# Patient Record
Sex: Female | Born: 1935
Health system: Southern US, Community
[De-identification: ages and names within clinical notes are randomized; demographics above are authoritative.]

## PROBLEM LIST (undated history)

## (undated) DIAGNOSIS — R52 Pain, unspecified: Secondary | ICD-10-CM

## (undated) DIAGNOSIS — R251 Tremor, unspecified: Secondary | ICD-10-CM

## (undated) DIAGNOSIS — N183 Chronic kidney disease, stage 3 unspecified: Secondary | ICD-10-CM

## (undated) DIAGNOSIS — M179 Osteoarthritis of knee, unspecified: Secondary | ICD-10-CM

## (undated) DIAGNOSIS — K219 Gastro-esophageal reflux disease without esophagitis: Secondary | ICD-10-CM

## (undated) DIAGNOSIS — Z9289 Personal history of other medical treatment: Secondary | ICD-10-CM

## (undated) DIAGNOSIS — I4891 Unspecified atrial fibrillation: Secondary | ICD-10-CM

## (undated) DIAGNOSIS — G4730 Sleep apnea, unspecified: Secondary | ICD-10-CM

## (undated) DIAGNOSIS — I495 Sick sinus syndrome: Secondary | ICD-10-CM

## (undated) DIAGNOSIS — T8859XA Other complications of anesthesia, initial encounter: Secondary | ICD-10-CM

## (undated) DIAGNOSIS — J189 Pneumonia, unspecified organism: Secondary | ICD-10-CM

## (undated) DIAGNOSIS — T4145XA Adverse effect of unspecified anesthetic, initial encounter: Secondary | ICD-10-CM

## (undated) DIAGNOSIS — E785 Hyperlipidemia, unspecified: Secondary | ICD-10-CM

## (undated) DIAGNOSIS — I639 Cerebral infarction, unspecified: Secondary | ICD-10-CM

## (undated) DIAGNOSIS — J449 Chronic obstructive pulmonary disease, unspecified: Secondary | ICD-10-CM

## (undated) DIAGNOSIS — M1710 Unilateral primary osteoarthritis, unspecified knee: Secondary | ICD-10-CM

## (undated) DIAGNOSIS — I2510 Atherosclerotic heart disease of native coronary artery without angina pectoris: Secondary | ICD-10-CM

## (undated) DIAGNOSIS — T462X5A Adverse effect of other antidysrhythmic drugs, initial encounter: Secondary | ICD-10-CM

## (undated) DIAGNOSIS — E119 Type 2 diabetes mellitus without complications: Secondary | ICD-10-CM

## (undated) DIAGNOSIS — D62 Acute posthemorrhagic anemia: Secondary | ICD-10-CM

## (undated) DIAGNOSIS — J984 Other disorders of lung: Secondary | ICD-10-CM

## (undated) DIAGNOSIS — Z950 Presence of cardiac pacemaker: Secondary | ICD-10-CM

## (undated) DIAGNOSIS — C50919 Malignant neoplasm of unspecified site of unspecified female breast: Secondary | ICD-10-CM

## (undated) DIAGNOSIS — J40 Bronchitis, not specified as acute or chronic: Secondary | ICD-10-CM

## (undated) DIAGNOSIS — F99 Mental disorder, not otherwise specified: Secondary | ICD-10-CM

## (undated) DIAGNOSIS — L309 Dermatitis, unspecified: Secondary | ICD-10-CM

## (undated) DIAGNOSIS — I712 Thoracic aortic aneurysm, without rupture: Secondary | ICD-10-CM

## (undated) DIAGNOSIS — Z85828 Personal history of other malignant neoplasm of skin: Secondary | ICD-10-CM

## (undated) DIAGNOSIS — Z720 Tobacco use: Secondary | ICD-10-CM

## (undated) DIAGNOSIS — M5431 Sciatica, right side: Secondary | ICD-10-CM

## (undated) DIAGNOSIS — I509 Heart failure, unspecified: Secondary | ICD-10-CM

## (undated) DIAGNOSIS — F329 Major depressive disorder, single episode, unspecified: Secondary | ICD-10-CM

## (undated) DIAGNOSIS — J45909 Unspecified asthma, uncomplicated: Secondary | ICD-10-CM

## (undated) DIAGNOSIS — I739 Peripheral vascular disease, unspecified: Secondary | ICD-10-CM

## (undated) DIAGNOSIS — IMO0001 Reserved for inherently not codable concepts without codable children: Secondary | ICD-10-CM

## (undated) HISTORY — DX: Unspecified atrial fibrillation: I48.91

## (undated) HISTORY — DX: Malignant neoplasm of unspecified site of unspecified female breast: C50.919

## (undated) HISTORY — DX: Atherosclerotic heart disease of native coronary artery without angina pectoris: I25.10

## (undated) HISTORY — DX: Sick sinus syndrome: I49.5

## (undated) HISTORY — PX: VASCULAR SURGERY: SHX849

## (undated) HISTORY — DX: Osteoarthritis of knee, unspecified: M17.9

## (undated) HISTORY — PX: CATARACT EXTRACTION, BILATERAL: SHX1313

## (undated) HISTORY — DX: Tobacco use: Z72.0

## (undated) HISTORY — DX: Adverse effect of other antidysrhythmic drugs, initial encounter: T46.2X5A

## (undated) HISTORY — DX: Hyperlipidemia, unspecified: E78.5

## (undated) HISTORY — DX: Type 2 diabetes mellitus without complications: E11.9

## (undated) HISTORY — PX: ATRIAL ABLATION SURGERY: SHX560

## (undated) HISTORY — DX: Cerebral infarction, unspecified: I63.9

## (undated) HISTORY — DX: Sciatica, right side: M54.31

## (undated) HISTORY — PX: OTHER SURGICAL HISTORY: SHX169

## (undated) HISTORY — DX: Personal history of other medical treatment: Z92.89

## (undated) HISTORY — DX: Unilateral primary osteoarthritis, unspecified knee: M17.10

## (undated) HISTORY — DX: Major depressive disorder, single episode, unspecified: F32.9

## (undated) HISTORY — PX: BREAST LUMPECTOMY: SHX2

## (undated) HISTORY — DX: Chronic obstructive pulmonary disease, unspecified: J44.9

## (undated) HISTORY — DX: Chronic kidney disease, stage 3 unspecified: N18.30

## (undated) HISTORY — DX: Presence of cardiac pacemaker: Z95.0

## (undated) HISTORY — DX: Sleep apnea, unspecified: G47.30

## (undated) HISTORY — DX: Chronic kidney disease, stage 3 (moderate): N18.3

## (undated) HISTORY — DX: Peripheral vascular disease, unspecified: I73.9

## (undated) HISTORY — DX: Gastro-esophageal reflux disease without esophagitis: K21.9

## (undated) HISTORY — DX: Other disorders of lung: J98.4

## (undated) HISTORY — PX: CARDIAC CATHETERIZATION: SHX172

---

## 1997-07-17 ENCOUNTER — Inpatient Hospital Stay (HOSPITAL_COMMUNITY): Admission: AD | Admit: 1997-07-17 | Discharge: 1997-07-20 | Payer: Self-pay | Admitting: *Deleted

## 1998-09-27 ENCOUNTER — Encounter: Payer: Self-pay | Admitting: Cardiovascular Disease

## 1998-09-27 ENCOUNTER — Inpatient Hospital Stay (HOSPITAL_COMMUNITY): Admission: AD | Admit: 1998-09-27 | Discharge: 1998-09-28 | Payer: Self-pay | Admitting: Cardiovascular Disease

## 1998-09-30 ENCOUNTER — Ambulatory Visit (HOSPITAL_COMMUNITY): Admission: RE | Admit: 1998-09-30 | Discharge: 1998-09-30 | Payer: Self-pay | Admitting: Cardiovascular Disease

## 1998-09-30 ENCOUNTER — Encounter: Payer: Self-pay | Admitting: Cardiovascular Disease

## 1998-10-14 ENCOUNTER — Inpatient Hospital Stay (HOSPITAL_COMMUNITY): Admission: AD | Admit: 1998-10-14 | Discharge: 1998-10-17 | Payer: Self-pay | Admitting: Cardiology

## 1998-10-15 ENCOUNTER — Encounter: Payer: Self-pay | Admitting: *Deleted

## 1998-10-21 ENCOUNTER — Other Ambulatory Visit: Admission: RE | Admit: 1998-10-21 | Discharge: 1998-10-21 | Payer: Self-pay | Admitting: Family Medicine

## 1998-11-25 ENCOUNTER — Ambulatory Visit (HOSPITAL_COMMUNITY): Admission: RE | Admit: 1998-11-25 | Discharge: 1998-11-25 | Payer: Self-pay | Admitting: Gastroenterology

## 1999-01-14 ENCOUNTER — Ambulatory Visit (HOSPITAL_COMMUNITY): Admission: RE | Admit: 1999-01-14 | Discharge: 1999-01-14 | Payer: Self-pay | Admitting: Obstetrics and Gynecology

## 1999-01-14 ENCOUNTER — Encounter (INDEPENDENT_AMBULATORY_CARE_PROVIDER_SITE_OTHER): Payer: Self-pay | Admitting: Specialist

## 1999-03-13 ENCOUNTER — Ambulatory Visit (HOSPITAL_COMMUNITY): Admission: RE | Admit: 1999-03-13 | Discharge: 1999-03-13 | Payer: Self-pay | Admitting: Internal Medicine

## 1999-04-02 ENCOUNTER — Encounter: Payer: Self-pay | Admitting: Internal Medicine

## 1999-04-02 ENCOUNTER — Encounter: Admission: RE | Admit: 1999-04-02 | Discharge: 1999-04-02 | Payer: Self-pay | Admitting: Internal Medicine

## 1999-04-14 ENCOUNTER — Encounter: Admission: RE | Admit: 1999-04-14 | Discharge: 1999-07-13 | Payer: Self-pay | Admitting: Internal Medicine

## 1999-05-13 ENCOUNTER — Other Ambulatory Visit: Admission: RE | Admit: 1999-05-13 | Discharge: 1999-05-13 | Payer: Self-pay | Admitting: Gastroenterology

## 1999-05-15 ENCOUNTER — Inpatient Hospital Stay (HOSPITAL_COMMUNITY): Admission: RE | Admit: 1999-05-15 | Discharge: 1999-05-16 | Payer: Self-pay | Admitting: Cardiovascular Disease

## 1999-06-02 ENCOUNTER — Encounter: Admission: RE | Admit: 1999-06-02 | Discharge: 1999-06-02 | Payer: Self-pay | Admitting: Internal Medicine

## 1999-06-02 ENCOUNTER — Encounter: Payer: Self-pay | Admitting: Internal Medicine

## 1999-08-22 ENCOUNTER — Inpatient Hospital Stay: Admission: RE | Admit: 1999-08-22 | Discharge: 1999-08-23 | Payer: Self-pay | Admitting: Cardiovascular Disease

## 2000-01-22 ENCOUNTER — Ambulatory Visit (HOSPITAL_COMMUNITY): Admission: RE | Admit: 2000-01-22 | Discharge: 2000-01-22 | Payer: Self-pay | Admitting: Gastroenterology

## 2000-01-23 ENCOUNTER — Other Ambulatory Visit: Admission: RE | Admit: 2000-01-23 | Discharge: 2000-01-23 | Payer: Self-pay | Admitting: Obstetrics and Gynecology

## 2000-01-23 ENCOUNTER — Encounter (INDEPENDENT_AMBULATORY_CARE_PROVIDER_SITE_OTHER): Payer: Self-pay

## 2000-05-21 ENCOUNTER — Encounter: Admission: RE | Admit: 2000-05-21 | Discharge: 2000-05-21 | Payer: Self-pay | Admitting: Internal Medicine

## 2000-05-21 ENCOUNTER — Encounter: Payer: Self-pay | Admitting: Internal Medicine

## 2000-05-28 ENCOUNTER — Encounter: Admission: RE | Admit: 2000-05-28 | Discharge: 2000-05-28 | Payer: Self-pay | Admitting: Internal Medicine

## 2000-05-28 ENCOUNTER — Encounter: Payer: Self-pay | Admitting: Internal Medicine

## 2000-07-12 ENCOUNTER — Other Ambulatory Visit: Admission: RE | Admit: 2000-07-12 | Discharge: 2000-07-12 | Payer: Self-pay | Admitting: Obstetrics and Gynecology

## 2001-07-12 ENCOUNTER — Other Ambulatory Visit: Admission: RE | Admit: 2001-07-12 | Discharge: 2001-07-12 | Payer: Self-pay | Admitting: Obstetrics and Gynecology

## 2001-08-04 ENCOUNTER — Encounter: Admission: RE | Admit: 2001-08-04 | Discharge: 2001-08-04 | Payer: Self-pay | Admitting: Obstetrics and Gynecology

## 2001-08-04 ENCOUNTER — Encounter: Payer: Self-pay | Admitting: Obstetrics and Gynecology

## 2001-09-28 ENCOUNTER — Emergency Department (HOSPITAL_COMMUNITY): Admission: AC | Admit: 2001-09-28 | Discharge: 2001-09-29 | Payer: Self-pay

## 2001-09-28 ENCOUNTER — Encounter: Payer: Self-pay | Admitting: Emergency Medicine

## 2001-10-24 ENCOUNTER — Encounter: Payer: Self-pay | Admitting: Obstetrics and Gynecology

## 2001-10-24 ENCOUNTER — Encounter: Admission: RE | Admit: 2001-10-24 | Discharge: 2001-10-24 | Payer: Self-pay | Admitting: Obstetrics and Gynecology

## 2002-05-30 ENCOUNTER — Ambulatory Visit (HOSPITAL_COMMUNITY): Admission: RE | Admit: 2002-05-30 | Discharge: 2002-05-30 | Payer: Self-pay | Admitting: Internal Medicine

## 2002-06-30 ENCOUNTER — Encounter: Admission: RE | Admit: 2002-06-30 | Discharge: 2002-06-30 | Payer: Self-pay | Admitting: Internal Medicine

## 2002-06-30 ENCOUNTER — Encounter: Payer: Self-pay | Admitting: Internal Medicine

## 2002-07-14 ENCOUNTER — Encounter: Payer: Self-pay | Admitting: Internal Medicine

## 2002-07-14 ENCOUNTER — Ambulatory Visit (HOSPITAL_COMMUNITY): Admission: RE | Admit: 2002-07-14 | Discharge: 2002-07-14 | Payer: Self-pay | Admitting: Internal Medicine

## 2002-07-17 ENCOUNTER — Other Ambulatory Visit: Admission: RE | Admit: 2002-07-17 | Discharge: 2002-07-17 | Payer: Self-pay | Admitting: Obstetrics and Gynecology

## 2002-12-19 ENCOUNTER — Inpatient Hospital Stay (HOSPITAL_COMMUNITY): Admission: EM | Admit: 2002-12-19 | Discharge: 2002-12-20 | Payer: Self-pay | Admitting: *Deleted

## 2002-12-19 ENCOUNTER — Encounter: Payer: Self-pay | Admitting: Cardiovascular Disease

## 2003-04-02 ENCOUNTER — Encounter: Admission: RE | Admit: 2003-04-02 | Discharge: 2003-04-02 | Payer: Self-pay | Admitting: Gastroenterology

## 2003-07-13 ENCOUNTER — Ambulatory Visit (HOSPITAL_COMMUNITY): Admission: RE | Admit: 2003-07-13 | Discharge: 2003-07-13 | Payer: Self-pay | Admitting: Internal Medicine

## 2003-07-30 ENCOUNTER — Other Ambulatory Visit: Admission: RE | Admit: 2003-07-30 | Discharge: 2003-07-30 | Payer: Self-pay | Admitting: Obstetrics and Gynecology

## 2003-10-24 ENCOUNTER — Inpatient Hospital Stay (HOSPITAL_COMMUNITY): Admission: EM | Admit: 2003-10-24 | Discharge: 2003-10-25 | Payer: Self-pay | Admitting: Emergency Medicine

## 2003-10-24 ENCOUNTER — Encounter (INDEPENDENT_AMBULATORY_CARE_PROVIDER_SITE_OTHER): Payer: Self-pay | Admitting: Cardiology

## 2003-12-13 ENCOUNTER — Inpatient Hospital Stay (HOSPITAL_COMMUNITY): Admission: EM | Admit: 2003-12-13 | Discharge: 2003-12-17 | Payer: Self-pay | Admitting: Emergency Medicine

## 2003-12-14 ENCOUNTER — Encounter (INDEPENDENT_AMBULATORY_CARE_PROVIDER_SITE_OTHER): Payer: Self-pay | Admitting: *Deleted

## 2004-01-11 ENCOUNTER — Ambulatory Visit: Payer: Self-pay | Admitting: Critical Care Medicine

## 2004-01-13 ENCOUNTER — Inpatient Hospital Stay (HOSPITAL_COMMUNITY): Admission: EM | Admit: 2004-01-13 | Discharge: 2004-01-18 | Payer: Self-pay | Admitting: Emergency Medicine

## 2004-01-20 ENCOUNTER — Inpatient Hospital Stay (HOSPITAL_COMMUNITY): Admission: EM | Admit: 2004-01-20 | Discharge: 2004-01-25 | Payer: Self-pay | Admitting: Emergency Medicine

## 2004-01-20 ENCOUNTER — Ambulatory Visit: Payer: Self-pay | Admitting: Internal Medicine

## 2004-01-20 ENCOUNTER — Ambulatory Visit: Payer: Self-pay | Admitting: Critical Care Medicine

## 2004-01-30 ENCOUNTER — Ambulatory Visit: Payer: Self-pay | Admitting: Cardiology

## 2004-02-06 ENCOUNTER — Ambulatory Visit: Payer: Self-pay | Admitting: Cardiology

## 2004-02-07 ENCOUNTER — Ambulatory Visit: Payer: Self-pay | Admitting: Internal Medicine

## 2004-02-07 ENCOUNTER — Ambulatory Visit: Payer: Self-pay

## 2004-02-13 ENCOUNTER — Ambulatory Visit: Payer: Self-pay | Admitting: *Deleted

## 2004-02-15 ENCOUNTER — Ambulatory Visit: Payer: Self-pay | Admitting: Critical Care Medicine

## 2004-02-21 ENCOUNTER — Ambulatory Visit: Payer: Self-pay | Admitting: Internal Medicine

## 2004-02-22 ENCOUNTER — Ambulatory Visit: Payer: Self-pay | Admitting: Critical Care Medicine

## 2004-02-27 ENCOUNTER — Ambulatory Visit: Payer: Self-pay | Admitting: Critical Care Medicine

## 2004-03-05 ENCOUNTER — Ambulatory Visit: Payer: Self-pay | Admitting: *Deleted

## 2004-03-18 ENCOUNTER — Ambulatory Visit: Payer: Self-pay | Admitting: Pulmonary Disease

## 2004-03-18 ENCOUNTER — Ambulatory Visit (HOSPITAL_BASED_OUTPATIENT_CLINIC_OR_DEPARTMENT_OTHER): Admission: RE | Admit: 2004-03-18 | Discharge: 2004-03-18 | Payer: Self-pay | Admitting: Critical Care Medicine

## 2004-03-31 ENCOUNTER — Ambulatory Visit: Payer: Self-pay

## 2004-04-16 ENCOUNTER — Ambulatory Visit: Payer: Self-pay | Admitting: Critical Care Medicine

## 2004-04-23 ENCOUNTER — Ambulatory Visit: Payer: Self-pay | Admitting: Cardiology

## 2004-05-07 ENCOUNTER — Ambulatory Visit: Payer: Self-pay | Admitting: *Deleted

## 2004-05-14 ENCOUNTER — Ambulatory Visit: Payer: Self-pay | Admitting: Internal Medicine

## 2004-06-13 ENCOUNTER — Ambulatory Visit: Payer: Self-pay | Admitting: Critical Care Medicine

## 2004-06-26 ENCOUNTER — Ambulatory Visit: Payer: Self-pay | Admitting: Internal Medicine

## 2004-07-10 ENCOUNTER — Ambulatory Visit: Payer: Self-pay | Admitting: Internal Medicine

## 2004-07-17 ENCOUNTER — Ambulatory Visit: Payer: Self-pay | Admitting: Internal Medicine

## 2004-07-18 ENCOUNTER — Ambulatory Visit (HOSPITAL_COMMUNITY): Admission: RE | Admit: 2004-07-18 | Discharge: 2004-07-18 | Payer: Self-pay | Admitting: Internal Medicine

## 2004-07-24 ENCOUNTER — Ambulatory Visit: Payer: Self-pay | Admitting: Cardiology

## 2004-08-13 ENCOUNTER — Ambulatory Visit: Payer: Self-pay | Admitting: Cardiology

## 2004-08-13 ENCOUNTER — Ambulatory Visit: Payer: Self-pay | Admitting: Internal Medicine

## 2004-09-01 ENCOUNTER — Ambulatory Visit: Payer: Self-pay | Admitting: Internal Medicine

## 2004-09-03 ENCOUNTER — Inpatient Hospital Stay (HOSPITAL_COMMUNITY): Admission: AD | Admit: 2004-09-03 | Discharge: 2004-09-07 | Payer: Self-pay | Admitting: Internal Medicine

## 2004-09-05 ENCOUNTER — Ambulatory Visit: Payer: Self-pay | Admitting: Internal Medicine

## 2004-10-01 ENCOUNTER — Ambulatory Visit: Payer: Self-pay | Admitting: Critical Care Medicine

## 2004-10-01 ENCOUNTER — Ambulatory Visit: Payer: Self-pay | Admitting: Cardiology

## 2004-10-03 ENCOUNTER — Ambulatory Visit: Payer: Self-pay | Admitting: Internal Medicine

## 2004-10-06 ENCOUNTER — Ambulatory Visit: Payer: Self-pay | Admitting: Internal Medicine

## 2004-11-05 ENCOUNTER — Ambulatory Visit: Payer: Self-pay | Admitting: Cardiology

## 2004-12-01 ENCOUNTER — Ambulatory Visit: Payer: Self-pay | Admitting: Internal Medicine

## 2004-12-05 ENCOUNTER — Ambulatory Visit: Payer: Self-pay | Admitting: Internal Medicine

## 2004-12-19 ENCOUNTER — Ambulatory Visit: Payer: Self-pay

## 2004-12-30 ENCOUNTER — Ambulatory Visit: Payer: Self-pay | Admitting: Cardiology

## 2005-01-01 ENCOUNTER — Ambulatory Visit: Payer: Self-pay | Admitting: Critical Care Medicine

## 2005-01-13 ENCOUNTER — Ambulatory Visit: Payer: Self-pay | Admitting: Cardiology

## 2005-01-23 ENCOUNTER — Ambulatory Visit: Payer: Self-pay | Admitting: Cardiology

## 2005-02-03 ENCOUNTER — Ambulatory Visit: Payer: Self-pay | Admitting: *Deleted

## 2005-02-10 ENCOUNTER — Ambulatory Visit: Payer: Self-pay | Admitting: Cardiology

## 2005-02-25 ENCOUNTER — Ambulatory Visit: Payer: Self-pay | Admitting: Cardiology

## 2005-02-25 ENCOUNTER — Ambulatory Visit: Payer: Self-pay | Admitting: Internal Medicine

## 2005-03-18 ENCOUNTER — Ambulatory Visit: Payer: Self-pay | Admitting: Cardiology

## 2005-04-02 ENCOUNTER — Ambulatory Visit: Payer: Self-pay | Admitting: Critical Care Medicine

## 2005-04-03 ENCOUNTER — Ambulatory Visit: Payer: Self-pay | Admitting: Internal Medicine

## 2005-04-08 ENCOUNTER — Ambulatory Visit: Payer: Self-pay | Admitting: Cardiology

## 2005-04-30 ENCOUNTER — Ambulatory Visit: Payer: Self-pay | Admitting: Cardiology

## 2005-05-25 ENCOUNTER — Ambulatory Visit: Payer: Self-pay | Admitting: Cardiology

## 2005-05-29 ENCOUNTER — Ambulatory Visit: Payer: Self-pay | Admitting: Critical Care Medicine

## 2005-06-08 ENCOUNTER — Ambulatory Visit: Payer: Self-pay | Admitting: Cardiology

## 2005-06-29 ENCOUNTER — Ambulatory Visit: Payer: Self-pay | Admitting: Internal Medicine

## 2005-07-14 ENCOUNTER — Ambulatory Visit: Payer: Self-pay | Admitting: Cardiology

## 2005-08-11 ENCOUNTER — Ambulatory Visit: Payer: Self-pay | Admitting: Internal Medicine

## 2005-09-08 ENCOUNTER — Ambulatory Visit: Payer: Self-pay | Admitting: Cardiology

## 2005-09-29 ENCOUNTER — Ambulatory Visit: Payer: Self-pay | Admitting: Internal Medicine

## 2005-10-08 ENCOUNTER — Ambulatory Visit: Payer: Self-pay | Admitting: Cardiology

## 2005-10-19 ENCOUNTER — Other Ambulatory Visit: Admission: RE | Admit: 2005-10-19 | Discharge: 2005-10-19 | Payer: Self-pay | Admitting: Obstetrics and Gynecology

## 2005-10-22 ENCOUNTER — Ambulatory Visit: Payer: Self-pay | Admitting: Cardiology

## 2005-11-05 ENCOUNTER — Ambulatory Visit: Payer: Self-pay | Admitting: Internal Medicine

## 2005-11-06 ENCOUNTER — Encounter: Admission: RE | Admit: 2005-11-06 | Discharge: 2005-11-06 | Payer: Self-pay | Admitting: Obstetrics and Gynecology

## 2005-11-12 ENCOUNTER — Ambulatory Visit: Payer: Self-pay | Admitting: Cardiology

## 2005-11-13 ENCOUNTER — Ambulatory Visit: Payer: Self-pay

## 2005-11-20 ENCOUNTER — Ambulatory Visit: Payer: Self-pay | Admitting: Critical Care Medicine

## 2005-12-01 ENCOUNTER — Ambulatory Visit: Payer: Self-pay | Admitting: Cardiovascular Disease

## 2005-12-29 ENCOUNTER — Ambulatory Visit: Payer: Self-pay | Admitting: Cardiology

## 2006-01-13 ENCOUNTER — Ambulatory Visit: Payer: Self-pay | Admitting: Cardiology

## 2006-01-27 ENCOUNTER — Ambulatory Visit: Payer: Self-pay | Admitting: Cardiology

## 2006-02-17 ENCOUNTER — Ambulatory Visit: Payer: Self-pay | Admitting: Internal Medicine

## 2006-03-15 ENCOUNTER — Ambulatory Visit: Payer: Self-pay | Admitting: Critical Care Medicine

## 2006-04-01 ENCOUNTER — Ambulatory Visit: Payer: Self-pay | Admitting: Internal Medicine

## 2006-04-05 ENCOUNTER — Ambulatory Visit: Payer: Self-pay

## 2006-04-15 ENCOUNTER — Ambulatory Visit: Payer: Self-pay | Admitting: Cardiology

## 2006-04-30 ENCOUNTER — Encounter: Admission: RE | Admit: 2006-04-30 | Discharge: 2006-04-30 | Payer: Self-pay | Admitting: Neurology

## 2006-05-13 ENCOUNTER — Ambulatory Visit: Payer: Self-pay | Admitting: Cardiology

## 2006-05-27 ENCOUNTER — Ambulatory Visit: Payer: Self-pay | Admitting: Internal Medicine

## 2006-06-02 ENCOUNTER — Ambulatory Visit (HOSPITAL_COMMUNITY): Admission: RE | Admit: 2006-06-02 | Discharge: 2006-06-02 | Payer: Self-pay | Admitting: Gastroenterology

## 2006-06-08 ENCOUNTER — Ambulatory Visit: Payer: Self-pay | Admitting: Critical Care Medicine

## 2006-06-11 ENCOUNTER — Ambulatory Visit: Payer: Self-pay | Admitting: Cardiology

## 2006-06-18 ENCOUNTER — Ambulatory Visit: Payer: Self-pay | Admitting: Internal Medicine

## 2006-06-24 ENCOUNTER — Ambulatory Visit: Payer: Self-pay | Admitting: Cardiology

## 2006-07-02 ENCOUNTER — Ambulatory Visit: Payer: Self-pay | Admitting: Cardiology

## 2006-07-27 ENCOUNTER — Ambulatory Visit: Payer: Self-pay | Admitting: Internal Medicine

## 2006-08-12 ENCOUNTER — Ambulatory Visit: Payer: Self-pay | Admitting: Critical Care Medicine

## 2006-08-31 ENCOUNTER — Ambulatory Visit: Payer: Self-pay | Admitting: Cardiology

## 2006-09-13 ENCOUNTER — Ambulatory Visit: Payer: Self-pay | Admitting: Cardiology

## 2006-09-16 ENCOUNTER — Ambulatory Visit: Payer: Self-pay | Admitting: Internal Medicine

## 2006-09-30 ENCOUNTER — Ambulatory Visit: Payer: Self-pay | Admitting: Internal Medicine

## 2006-10-18 ENCOUNTER — Other Ambulatory Visit: Admission: RE | Admit: 2006-10-18 | Discharge: 2006-10-18 | Payer: Self-pay | Admitting: Internal Medicine

## 2006-10-28 ENCOUNTER — Ambulatory Visit: Payer: Self-pay | Admitting: Cardiology

## 2006-10-29 ENCOUNTER — Ambulatory Visit: Payer: Self-pay | Admitting: Critical Care Medicine

## 2006-11-11 ENCOUNTER — Ambulatory Visit: Payer: Self-pay | Admitting: Internal Medicine

## 2006-11-25 ENCOUNTER — Ambulatory Visit: Payer: Self-pay | Admitting: Internal Medicine

## 2006-12-08 ENCOUNTER — Ambulatory Visit: Payer: Self-pay | Admitting: Cardiovascular Disease

## 2006-12-08 ENCOUNTER — Encounter: Admission: RE | Admit: 2006-12-08 | Discharge: 2006-12-08 | Payer: Self-pay | Admitting: Internal Medicine

## 2006-12-09 ENCOUNTER — Ambulatory Visit: Payer: Self-pay

## 2006-12-13 ENCOUNTER — Ambulatory Visit: Payer: Self-pay

## 2006-12-23 ENCOUNTER — Ambulatory Visit: Payer: Self-pay | Admitting: Cardiology

## 2006-12-30 DIAGNOSIS — J449 Chronic obstructive pulmonary disease, unspecified: Secondary | ICD-10-CM | POA: Insufficient documentation

## 2006-12-30 DIAGNOSIS — I2510 Atherosclerotic heart disease of native coronary artery without angina pectoris: Secondary | ICD-10-CM

## 2006-12-30 DIAGNOSIS — J4489 Other specified chronic obstructive pulmonary disease: Secondary | ICD-10-CM | POA: Insufficient documentation

## 2006-12-30 DIAGNOSIS — E785 Hyperlipidemia, unspecified: Secondary | ICD-10-CM

## 2007-01-06 ENCOUNTER — Ambulatory Visit: Payer: Self-pay | Admitting: Internal Medicine

## 2007-01-20 ENCOUNTER — Ambulatory Visit: Payer: Self-pay | Admitting: Cardiovascular Disease

## 2007-02-02 ENCOUNTER — Ambulatory Visit: Payer: Self-pay | Admitting: Cardiology

## 2007-02-04 ENCOUNTER — Ambulatory Visit: Payer: Self-pay | Admitting: Internal Medicine

## 2007-02-11 ENCOUNTER — Encounter: Payer: Self-pay | Admitting: Critical Care Medicine

## 2007-02-11 ENCOUNTER — Ambulatory Visit: Payer: Self-pay | Admitting: Critical Care Medicine

## 2007-02-24 ENCOUNTER — Ambulatory Visit: Payer: Self-pay | Admitting: Cardiology

## 2007-03-04 ENCOUNTER — Ambulatory Visit: Payer: Self-pay | Admitting: Internal Medicine

## 2007-03-08 ENCOUNTER — Ambulatory Visit (HOSPITAL_BASED_OUTPATIENT_CLINIC_OR_DEPARTMENT_OTHER): Admission: RE | Admit: 2007-03-08 | Discharge: 2007-03-08 | Payer: Self-pay | Admitting: Critical Care Medicine

## 2007-03-17 ENCOUNTER — Ambulatory Visit: Payer: Self-pay | Admitting: Pulmonary Disease

## 2007-03-18 ENCOUNTER — Ambulatory Visit: Payer: Self-pay | Admitting: Pulmonary Disease

## 2007-03-22 ENCOUNTER — Ambulatory Visit: Payer: Self-pay | Admitting: Internal Medicine

## 2007-03-22 ENCOUNTER — Ambulatory Visit: Payer: Self-pay | Admitting: Cardiovascular Disease

## 2007-03-22 LAB — CONVERTED CEMR LAB
Eosinophils Absolute: 0.2 10*3/uL (ref 0.0–0.6)
Eosinophils Relative: 2.1 % (ref 0.0–5.0)
Hemoglobin: 15.3 g/dL — ABNORMAL HIGH (ref 12.0–15.0)
Lymphocytes Relative: 28.7 % (ref 12.0–46.0)
MCHC: 33.3 g/dL (ref 30.0–36.0)
MCV: 87.1 fL (ref 78.0–100.0)
Monocytes Absolute: 0.8 10*3/uL — ABNORMAL HIGH (ref 0.2–0.7)
Monocytes Relative: 7.4 % (ref 3.0–11.0)
Neutro Abs: 7 10*3/uL (ref 1.4–7.7)
Platelets: 262 10*3/uL (ref 150–400)
TSH: 2.26 microintl units/mL (ref 0.35–5.50)

## 2007-03-31 ENCOUNTER — Ambulatory Visit: Payer: Self-pay | Admitting: Internal Medicine

## 2007-04-20 ENCOUNTER — Ambulatory Visit: Payer: Self-pay | Admitting: Cardiovascular Disease

## 2007-05-06 ENCOUNTER — Ambulatory Visit: Payer: Self-pay | Admitting: Internal Medicine

## 2007-05-12 ENCOUNTER — Ambulatory Visit: Payer: Self-pay | Admitting: Cardiovascular Disease

## 2007-05-24 ENCOUNTER — Ambulatory Visit: Payer: Self-pay | Admitting: Cardiology

## 2007-05-26 ENCOUNTER — Ambulatory Visit: Payer: Self-pay | Admitting: Critical Care Medicine

## 2007-06-08 ENCOUNTER — Ambulatory Visit: Payer: Self-pay | Admitting: Internal Medicine

## 2007-06-15 ENCOUNTER — Ambulatory Visit: Payer: Self-pay

## 2007-06-15 ENCOUNTER — Encounter: Payer: Self-pay | Admitting: Internal Medicine

## 2007-06-23 ENCOUNTER — Ambulatory Visit: Payer: Self-pay | Admitting: Cardiology

## 2007-07-01 ENCOUNTER — Ambulatory Visit: Payer: Self-pay | Admitting: Internal Medicine

## 2007-07-02 ENCOUNTER — Ambulatory Visit: Payer: Self-pay | Admitting: Cardiology

## 2007-07-03 ENCOUNTER — Ambulatory Visit: Payer: Self-pay | Admitting: Pulmonary Disease

## 2007-07-03 ENCOUNTER — Inpatient Hospital Stay (HOSPITAL_COMMUNITY): Admission: EM | Admit: 2007-07-03 | Discharge: 2007-07-05 | Payer: Self-pay | Admitting: Emergency Medicine

## 2007-07-04 ENCOUNTER — Encounter: Payer: Self-pay | Admitting: Pulmonary Disease

## 2007-07-05 ENCOUNTER — Encounter: Payer: Self-pay | Admitting: Pulmonary Disease

## 2007-07-07 ENCOUNTER — Ambulatory Visit: Payer: Self-pay | Admitting: Cardiology

## 2007-07-13 ENCOUNTER — Ambulatory Visit: Payer: Self-pay | Admitting: Cardiovascular Disease

## 2007-07-14 ENCOUNTER — Ambulatory Visit: Payer: Self-pay | Admitting: Internal Medicine

## 2007-07-14 LAB — CONVERTED CEMR LAB
BUN: 32 mg/dL — ABNORMAL HIGH (ref 6–23)
CO2: 32 meq/L (ref 19–32)
Chloride: 108 meq/L (ref 96–112)
GFR calc non Af Amer: 39 mL/min
Glucose, Bld: 128 mg/dL — ABNORMAL HIGH (ref 70–99)
Sodium: 142 meq/L (ref 135–145)

## 2007-07-21 ENCOUNTER — Ambulatory Visit: Payer: Self-pay | Admitting: Internal Medicine

## 2007-07-28 ENCOUNTER — Ambulatory Visit: Payer: Self-pay | Admitting: Internal Medicine

## 2007-08-02 ENCOUNTER — Ambulatory Visit: Payer: Self-pay | Admitting: Pulmonary Disease

## 2007-08-05 ENCOUNTER — Ambulatory Visit: Payer: Self-pay | Admitting: Cardiology

## 2007-08-19 ENCOUNTER — Ambulatory Visit: Payer: Self-pay | Admitting: Cardiology

## 2007-08-19 ENCOUNTER — Telehealth (INDEPENDENT_AMBULATORY_CARE_PROVIDER_SITE_OTHER): Payer: Self-pay | Admitting: *Deleted

## 2007-08-22 ENCOUNTER — Ambulatory Visit: Payer: Self-pay | Admitting: Internal Medicine

## 2007-08-26 ENCOUNTER — Ambulatory Visit: Payer: Self-pay | Admitting: Internal Medicine

## 2007-09-02 ENCOUNTER — Ambulatory Visit: Payer: Self-pay | Admitting: Cardiology

## 2007-09-08 ENCOUNTER — Ambulatory Visit: Payer: Self-pay | Admitting: Cardiology

## 2007-09-16 ENCOUNTER — Ambulatory Visit: Payer: Self-pay | Admitting: Cardiovascular Disease

## 2007-09-21 ENCOUNTER — Ambulatory Visit (HOSPITAL_COMMUNITY): Admission: RE | Admit: 2007-09-21 | Discharge: 2007-09-21 | Payer: Self-pay | Admitting: Internal Medicine

## 2007-09-21 ENCOUNTER — Ambulatory Visit: Payer: Self-pay | Admitting: Internal Medicine

## 2007-09-26 ENCOUNTER — Ambulatory Visit: Payer: Self-pay | Admitting: Cardiovascular Disease

## 2007-09-30 ENCOUNTER — Ambulatory Visit: Payer: Self-pay | Admitting: Cardiology

## 2007-10-03 ENCOUNTER — Ambulatory Visit: Payer: Self-pay | Admitting: Cardiovascular Disease

## 2007-10-07 ENCOUNTER — Ambulatory Visit: Payer: Self-pay | Admitting: Critical Care Medicine

## 2007-10-11 ENCOUNTER — Ambulatory Visit: Payer: Self-pay | Admitting: Cardiology

## 2007-10-13 ENCOUNTER — Telehealth: Payer: Self-pay | Admitting: Critical Care Medicine

## 2007-10-17 ENCOUNTER — Encounter: Payer: Self-pay | Admitting: Critical Care Medicine

## 2007-10-21 ENCOUNTER — Ambulatory Visit: Payer: Self-pay | Admitting: Cardiovascular Disease

## 2007-10-25 ENCOUNTER — Telehealth (INDEPENDENT_AMBULATORY_CARE_PROVIDER_SITE_OTHER): Payer: Self-pay | Admitting: *Deleted

## 2007-10-25 ENCOUNTER — Ambulatory Visit: Payer: Self-pay | Admitting: Cardiology

## 2007-11-01 ENCOUNTER — Ambulatory Visit: Payer: Self-pay | Admitting: Cardiology

## 2007-11-04 ENCOUNTER — Ambulatory Visit: Payer: Self-pay | Admitting: Internal Medicine

## 2007-11-10 ENCOUNTER — Telehealth: Payer: Self-pay | Admitting: Critical Care Medicine

## 2007-11-10 ENCOUNTER — Ambulatory Visit: Payer: Self-pay | Admitting: Critical Care Medicine

## 2007-11-11 ENCOUNTER — Telehealth: Payer: Self-pay | Admitting: Pulmonary Disease

## 2007-11-25 ENCOUNTER — Ambulatory Visit: Payer: Self-pay | Admitting: Cardiology

## 2007-11-28 ENCOUNTER — Ambulatory Visit: Payer: Self-pay | Admitting: Internal Medicine

## 2007-12-06 ENCOUNTER — Ambulatory Visit: Payer: Self-pay | Admitting: Cardiology

## 2007-12-22 ENCOUNTER — Encounter: Admission: RE | Admit: 2007-12-22 | Discharge: 2007-12-22 | Payer: Self-pay | Admitting: Orthopedic Surgery

## 2007-12-27 ENCOUNTER — Ambulatory Visit: Payer: Self-pay | Admitting: Internal Medicine

## 2007-12-28 ENCOUNTER — Encounter: Admission: RE | Admit: 2007-12-28 | Discharge: 2007-12-28 | Payer: Self-pay | Admitting: Nephrology

## 2007-12-30 ENCOUNTER — Ambulatory Visit: Payer: Self-pay | Admitting: Internal Medicine

## 2008-01-16 ENCOUNTER — Ambulatory Visit: Payer: Self-pay | Admitting: Internal Medicine

## 2008-01-24 ENCOUNTER — Ambulatory Visit: Payer: Self-pay | Admitting: Cardiovascular Disease

## 2008-02-09 ENCOUNTER — Ambulatory Visit: Payer: Self-pay | Admitting: Critical Care Medicine

## 2008-02-16 ENCOUNTER — Ambulatory Visit: Payer: Self-pay | Admitting: Cardiology

## 2008-02-16 ENCOUNTER — Ambulatory Visit: Payer: Self-pay | Admitting: Diagnostic Radiology

## 2008-02-16 ENCOUNTER — Ambulatory Visit (HOSPITAL_BASED_OUTPATIENT_CLINIC_OR_DEPARTMENT_OTHER): Admission: RE | Admit: 2008-02-16 | Discharge: 2008-02-16 | Payer: Self-pay | Admitting: Critical Care Medicine

## 2008-02-16 ENCOUNTER — Ambulatory Visit: Payer: Self-pay | Admitting: Critical Care Medicine

## 2008-02-17 ENCOUNTER — Telehealth: Payer: Self-pay | Admitting: Critical Care Medicine

## 2008-02-17 LAB — CONVERTED CEMR LAB
ALT: 66 units/L — ABNORMAL HIGH (ref 0–35)
AST: 61 units/L — ABNORMAL HIGH (ref 0–37)
Albumin: 2.6 g/dL — ABNORMAL LOW (ref 3.5–5.2)
Alkaline Phosphatase: 68 units/L (ref 39–117)
BUN: 13 mg/dL (ref 6–23)
Basophils Absolute: 0.1 10*3/uL (ref 0.0–0.1)
Basophils Relative: 1.2 % (ref 0.0–3.0)
Bilirubin, Direct: 0.1 mg/dL (ref 0.0–0.3)
Calcium: 8.6 mg/dL (ref 8.4–10.5)
Creatinine, Ser: 1.1 mg/dL (ref 0.4–1.2)
Eosinophils Absolute: 0.1 10*3/uL (ref 0.0–0.7)
HCT: 33.2 % — ABNORMAL LOW (ref 36.0–46.0)
Lymphocytes Relative: 12.1 % (ref 12.0–46.0)
Monocytes Absolute: 0.8 10*3/uL (ref 0.1–1.0)
Neutro Abs: 8.1 10*3/uL — ABNORMAL HIGH (ref 1.4–7.7)
Potassium: 4.1 meq/L (ref 3.5–5.1)
RBC: 3.68 M/uL — ABNORMAL LOW (ref 3.87–5.11)
Sed Rate: 112 mm/hr — ABNORMAL HIGH (ref 0–22)
Sodium: 141 meq/L (ref 135–145)
Total Bilirubin: 0.8 mg/dL (ref 0.3–1.2)
Total Protein: 6.8 g/dL (ref 6.0–8.3)
WBC: 10.3 10*3/uL (ref 4.5–10.5)

## 2008-02-18 ENCOUNTER — Ambulatory Visit: Payer: Self-pay | Admitting: Cardiovascular Disease

## 2008-02-18 ENCOUNTER — Ambulatory Visit: Payer: Self-pay | Admitting: Critical Care Medicine

## 2008-02-18 ENCOUNTER — Inpatient Hospital Stay (HOSPITAL_COMMUNITY): Admission: EM | Admit: 2008-02-18 | Discharge: 2008-02-20 | Payer: Self-pay | Admitting: Critical Care Medicine

## 2008-02-21 ENCOUNTER — Ambulatory Visit: Payer: Self-pay | Admitting: Internal Medicine

## 2008-02-27 ENCOUNTER — Ambulatory Visit: Payer: Self-pay | Admitting: Cardiology

## 2008-02-29 ENCOUNTER — Ambulatory Visit: Payer: Self-pay | Admitting: Critical Care Medicine

## 2008-03-13 ENCOUNTER — Telehealth (INDEPENDENT_AMBULATORY_CARE_PROVIDER_SITE_OTHER): Payer: Self-pay | Admitting: *Deleted

## 2008-03-15 ENCOUNTER — Ambulatory Visit: Payer: Self-pay | Admitting: Critical Care Medicine

## 2008-03-15 ENCOUNTER — Ambulatory Visit (HOSPITAL_BASED_OUTPATIENT_CLINIC_OR_DEPARTMENT_OTHER): Admission: RE | Admit: 2008-03-15 | Discharge: 2008-03-15 | Payer: Self-pay | Admitting: Critical Care Medicine

## 2008-03-15 ENCOUNTER — Ambulatory Visit: Payer: Self-pay | Admitting: Diagnostic Radiology

## 2008-03-28 ENCOUNTER — Ambulatory Visit: Payer: Self-pay | Admitting: Internal Medicine

## 2008-03-28 ENCOUNTER — Encounter: Payer: Self-pay | Admitting: Internal Medicine

## 2008-04-09 ENCOUNTER — Ambulatory Visit: Payer: Self-pay | Admitting: Cardiology

## 2008-04-13 ENCOUNTER — Ambulatory Visit: Payer: Self-pay | Admitting: Critical Care Medicine

## 2008-04-17 ENCOUNTER — Ambulatory Visit: Payer: Self-pay | Admitting: Internal Medicine

## 2008-04-17 LAB — CONVERTED CEMR LAB
Basophils Relative: 0.7 % (ref 0.0–3.0)
Chloride: 103 meq/L (ref 96–112)
Eosinophils Absolute: 0.1 10*3/uL (ref 0.0–0.7)
GFR calc Af Amer: 62 mL/min
INR: 2.2 — ABNORMAL HIGH (ref 0.8–1.0)
MCHC: 33 g/dL (ref 30.0–36.0)
Monocytes Absolute: 0.6 10*3/uL (ref 0.1–1.0)
Monocytes Relative: 8 % (ref 3.0–12.0)
Neutro Abs: 4.3 10*3/uL (ref 1.4–7.7)
RBC: 4.62 M/uL (ref 3.87–5.11)
WBC: 6.9 10*3/uL (ref 4.5–10.5)
aPTT: 34.6 s — ABNORMAL HIGH (ref 21.7–29.8)

## 2008-04-23 ENCOUNTER — Ambulatory Visit: Payer: Self-pay | Admitting: Internal Medicine

## 2008-04-23 ENCOUNTER — Ambulatory Visit (HOSPITAL_COMMUNITY): Admission: RE | Admit: 2008-04-23 | Discharge: 2008-04-23 | Payer: Self-pay | Admitting: Internal Medicine

## 2008-04-27 ENCOUNTER — Ambulatory Visit: Payer: Self-pay | Admitting: Internal Medicine

## 2008-04-29 ENCOUNTER — Ambulatory Visit: Payer: Self-pay | Admitting: *Deleted

## 2008-04-30 ENCOUNTER — Ambulatory Visit: Payer: Self-pay | Admitting: Internal Medicine

## 2008-04-30 ENCOUNTER — Inpatient Hospital Stay (HOSPITAL_COMMUNITY): Admission: EM | Admit: 2008-04-30 | Discharge: 2008-05-09 | Payer: Self-pay | Admitting: Emergency Medicine

## 2008-05-08 ENCOUNTER — Ambulatory Visit: Payer: Self-pay | Admitting: Vascular Surgery

## 2008-05-08 ENCOUNTER — Encounter: Payer: Self-pay | Admitting: Cardiology

## 2008-05-08 ENCOUNTER — Encounter: Payer: Self-pay | Admitting: Internal Medicine

## 2008-05-14 ENCOUNTER — Ambulatory Visit: Payer: Self-pay | Admitting: Internal Medicine

## 2008-05-14 ENCOUNTER — Ambulatory Visit: Payer: Self-pay | Admitting: Cardiology

## 2008-05-14 LAB — CONVERTED CEMR LAB
BUN: 30 mg/dL — ABNORMAL HIGH (ref 6–23)
Chloride: 98 meq/L (ref 96–112)
GFR calc Af Amer: 62 mL/min
GFR calc non Af Amer: 52 mL/min
Potassium: 3.6 meq/L (ref 3.5–5.1)
Sodium: 140 meq/L (ref 135–145)

## 2008-05-15 ENCOUNTER — Encounter: Payer: Self-pay | Admitting: Critical Care Medicine

## 2008-05-16 ENCOUNTER — Ambulatory Visit: Payer: Self-pay | Admitting: Cardiovascular Disease

## 2008-05-23 ENCOUNTER — Ambulatory Visit: Payer: Self-pay | Admitting: Critical Care Medicine

## 2008-05-25 ENCOUNTER — Ambulatory Visit: Payer: Self-pay | Admitting: Cardiology

## 2008-06-01 DIAGNOSIS — I4891 Unspecified atrial fibrillation: Secondary | ICD-10-CM

## 2008-06-01 DIAGNOSIS — I739 Peripheral vascular disease, unspecified: Secondary | ICD-10-CM

## 2008-06-01 DIAGNOSIS — I5033 Acute on chronic diastolic (congestive) heart failure: Secondary | ICD-10-CM | POA: Insufficient documentation

## 2008-06-01 DIAGNOSIS — E118 Type 2 diabetes mellitus with unspecified complications: Secondary | ICD-10-CM

## 2008-06-01 DIAGNOSIS — G4733 Obstructive sleep apnea (adult) (pediatric): Secondary | ICD-10-CM

## 2008-06-04 ENCOUNTER — Ambulatory Visit: Payer: Self-pay | Admitting: Cardiology

## 2008-06-12 ENCOUNTER — Encounter: Payer: Self-pay | Admitting: Critical Care Medicine

## 2008-06-25 ENCOUNTER — Ambulatory Visit: Payer: Self-pay | Admitting: Cardiovascular Disease

## 2008-06-26 ENCOUNTER — Encounter: Payer: Self-pay | Admitting: Internal Medicine

## 2008-06-26 ENCOUNTER — Ambulatory Visit: Payer: Self-pay | Admitting: Internal Medicine

## 2008-06-29 ENCOUNTER — Ambulatory Visit: Payer: Self-pay | Admitting: Internal Medicine

## 2008-07-02 ENCOUNTER — Encounter: Payer: Self-pay | Admitting: Critical Care Medicine

## 2008-07-13 ENCOUNTER — Encounter: Payer: Self-pay | Admitting: Internal Medicine

## 2008-07-14 ENCOUNTER — Telehealth (INDEPENDENT_AMBULATORY_CARE_PROVIDER_SITE_OTHER): Payer: Self-pay | Admitting: Physician Assistant

## 2008-07-14 ENCOUNTER — Emergency Department (HOSPITAL_COMMUNITY): Admission: EM | Admit: 2008-07-14 | Discharge: 2008-07-14 | Payer: Self-pay | Admitting: Emergency Medicine

## 2008-07-17 ENCOUNTER — Telehealth: Payer: Self-pay | Admitting: Cardiology

## 2008-07-19 ENCOUNTER — Ambulatory Visit: Payer: Self-pay | Admitting: Cardiovascular Disease

## 2008-07-31 ENCOUNTER — Ambulatory Visit: Payer: Self-pay | Admitting: Critical Care Medicine

## 2008-08-08 ENCOUNTER — Encounter: Payer: Self-pay | Admitting: *Deleted

## 2008-08-10 ENCOUNTER — Ambulatory Visit: Payer: Self-pay | Admitting: Cardiology

## 2008-08-10 LAB — CONVERTED CEMR LAB
POC INR: 1.9
Protime: 16.8

## 2008-08-16 ENCOUNTER — Telehealth: Payer: Self-pay | Admitting: Internal Medicine

## 2008-08-17 ENCOUNTER — Ambulatory Visit: Payer: Self-pay | Admitting: Cardiovascular Disease

## 2008-08-17 DIAGNOSIS — I6529 Occlusion and stenosis of unspecified carotid artery: Secondary | ICD-10-CM

## 2008-08-29 ENCOUNTER — Ambulatory Visit: Payer: Self-pay | Admitting: Cardiology

## 2008-08-29 LAB — CONVERTED CEMR LAB
POC INR: 1.9
Prothrombin Time: 16.8 s

## 2008-09-05 ENCOUNTER — Telehealth: Payer: Self-pay | Admitting: Internal Medicine

## 2008-09-12 ENCOUNTER — Encounter: Payer: Self-pay | Admitting: *Deleted

## 2008-09-14 ENCOUNTER — Encounter: Payer: Self-pay | Admitting: Critical Care Medicine

## 2008-09-19 ENCOUNTER — Ambulatory Visit: Payer: Self-pay | Admitting: Internal Medicine

## 2008-09-19 LAB — CONVERTED CEMR LAB: Prothrombin Time: 15.6 s

## 2008-09-28 ENCOUNTER — Ambulatory Visit: Payer: Self-pay | Admitting: Internal Medicine

## 2008-10-09 ENCOUNTER — Encounter: Payer: Self-pay | Admitting: Internal Medicine

## 2008-10-09 ENCOUNTER — Ambulatory Visit: Payer: Self-pay | Admitting: Cardiology

## 2008-10-09 ENCOUNTER — Telehealth: Payer: Self-pay | Admitting: Internal Medicine

## 2008-10-10 ENCOUNTER — Ambulatory Visit (HOSPITAL_COMMUNITY): Admission: RE | Admit: 2008-10-10 | Discharge: 2008-10-10 | Payer: Self-pay | Admitting: Internal Medicine

## 2008-10-10 LAB — CONVERTED CEMR LAB
Chloride: 105 meq/L (ref 96–112)
GFR calc non Af Amer: 57.4 mL/min (ref >60)
Magnesium: 2.5 mg/dL (ref 1.5–2.5)
Potassium: 3.8 meq/L (ref 3.5–5.1)
Sodium: 144 meq/L (ref 135–145)

## 2008-10-12 ENCOUNTER — Ambulatory Visit: Payer: Self-pay | Admitting: Critical Care Medicine

## 2008-10-22 ENCOUNTER — Encounter: Payer: Self-pay | Admitting: Internal Medicine

## 2008-10-22 ENCOUNTER — Ambulatory Visit: Payer: Self-pay | Admitting: Internal Medicine

## 2008-10-22 ENCOUNTER — Ambulatory Visit: Payer: Self-pay | Admitting: Cardiovascular Disease

## 2008-10-22 LAB — CONVERTED CEMR LAB: POC INR: 1.8

## 2008-10-25 LAB — CONVERTED CEMR LAB
Chloride: 106 meq/L (ref 96–112)
GFR calc non Af Amer: 57.39 mL/min (ref >60)
Potassium: 4.2 meq/L (ref 3.5–5.1)
Sodium: 144 meq/L (ref 135–145)

## 2008-11-06 ENCOUNTER — Ambulatory Visit: Payer: Self-pay | Admitting: Cardiology

## 2008-11-06 LAB — CONVERTED CEMR LAB: POC INR: 1.9

## 2008-11-08 ENCOUNTER — Telehealth: Payer: Self-pay | Admitting: Internal Medicine

## 2008-11-08 ENCOUNTER — Encounter: Payer: Self-pay | Admitting: Internal Medicine

## 2008-11-21 ENCOUNTER — Ambulatory Visit: Payer: Self-pay | Admitting: Internal Medicine

## 2008-12-05 ENCOUNTER — Telehealth (INDEPENDENT_AMBULATORY_CARE_PROVIDER_SITE_OTHER): Payer: Self-pay | Admitting: *Deleted

## 2008-12-12 ENCOUNTER — Ambulatory Visit: Payer: Self-pay | Admitting: Critical Care Medicine

## 2008-12-12 DIAGNOSIS — F17200 Nicotine dependence, unspecified, uncomplicated: Secondary | ICD-10-CM

## 2008-12-13 ENCOUNTER — Ambulatory Visit: Payer: Self-pay | Admitting: Internal Medicine

## 2008-12-17 ENCOUNTER — Telehealth: Payer: Self-pay | Admitting: Internal Medicine

## 2008-12-18 ENCOUNTER — Ambulatory Visit: Payer: Self-pay | Admitting: Internal Medicine

## 2008-12-18 ENCOUNTER — Encounter: Payer: Self-pay | Admitting: Internal Medicine

## 2008-12-24 ENCOUNTER — Ambulatory Visit: Payer: Self-pay | Admitting: Internal Medicine

## 2008-12-24 LAB — CONVERTED CEMR LAB
Calcium: 8.9 mg/dL (ref 8.4–10.5)
GFR calc non Af Amer: 51.39 mL/min (ref >60)
Magnesium: 2.5 mg/dL (ref 1.5–2.5)
Potassium: 4.2 meq/L (ref 3.5–5.1)
Sodium: 142 meq/L (ref 135–145)

## 2008-12-25 ENCOUNTER — Ambulatory Visit: Payer: Self-pay | Admitting: Internal Medicine

## 2008-12-28 ENCOUNTER — Ambulatory Visit: Payer: Self-pay | Admitting: Internal Medicine

## 2008-12-31 ENCOUNTER — Ambulatory Visit: Payer: Self-pay | Admitting: Critical Care Medicine

## 2009-01-03 ENCOUNTER — Ambulatory Visit: Payer: Self-pay | Admitting: Cardiology

## 2009-01-07 ENCOUNTER — Telehealth (INDEPENDENT_AMBULATORY_CARE_PROVIDER_SITE_OTHER): Payer: Self-pay

## 2009-01-08 ENCOUNTER — Ambulatory Visit: Payer: Self-pay | Admitting: Internal Medicine

## 2009-01-08 ENCOUNTER — Encounter (HOSPITAL_COMMUNITY): Admission: RE | Admit: 2009-01-08 | Discharge: 2009-03-07 | Payer: Self-pay | Admitting: Internal Medicine

## 2009-01-08 ENCOUNTER — Ambulatory Visit: Payer: Self-pay | Admitting: Cardiology

## 2009-01-08 ENCOUNTER — Ambulatory Visit: Payer: Self-pay

## 2009-01-10 LAB — CONVERTED CEMR LAB
BUN: 28 mg/dL — ABNORMAL HIGH (ref 6–23)
Calcium: 9.2 mg/dL (ref 8.4–10.5)
GFR calc non Af Amer: 35.93 mL/min (ref >60)
Glucose, Bld: 151 mg/dL — ABNORMAL HIGH (ref 70–99)
Potassium: 4.1 meq/L (ref 3.5–5.1)
Sodium: 138 meq/L (ref 135–145)

## 2009-01-15 ENCOUNTER — Ambulatory Visit: Payer: Self-pay | Admitting: Internal Medicine

## 2009-01-15 ENCOUNTER — Encounter: Payer: Self-pay | Admitting: Internal Medicine

## 2009-01-18 ENCOUNTER — Telehealth: Payer: Self-pay | Admitting: Internal Medicine

## 2009-01-24 ENCOUNTER — Telehealth: Payer: Self-pay | Admitting: Internal Medicine

## 2009-01-29 ENCOUNTER — Telehealth: Payer: Self-pay | Admitting: Internal Medicine

## 2009-02-04 ENCOUNTER — Encounter: Payer: Self-pay | Admitting: Internal Medicine

## 2009-02-11 ENCOUNTER — Ambulatory Visit: Payer: Self-pay | Admitting: Cardiology

## 2009-02-11 LAB — CONVERTED CEMR LAB: POC INR: 2.3

## 2009-02-12 ENCOUNTER — Ambulatory Visit: Payer: Self-pay | Admitting: Critical Care Medicine

## 2009-02-13 ENCOUNTER — Telehealth (INDEPENDENT_AMBULATORY_CARE_PROVIDER_SITE_OTHER): Payer: Self-pay | Admitting: *Deleted

## 2009-02-23 ENCOUNTER — Encounter: Payer: Self-pay | Admitting: Internal Medicine

## 2009-02-25 ENCOUNTER — Telehealth: Payer: Self-pay | Admitting: Internal Medicine

## 2009-02-25 ENCOUNTER — Ambulatory Visit: Payer: Self-pay | Admitting: Cardiology

## 2009-02-26 ENCOUNTER — Telehealth: Payer: Self-pay | Admitting: Internal Medicine

## 2009-02-27 ENCOUNTER — Telehealth: Payer: Self-pay | Admitting: Critical Care Medicine

## 2009-02-28 ENCOUNTER — Ambulatory Visit: Payer: Self-pay | Admitting: Internal Medicine

## 2009-02-28 LAB — CONVERTED CEMR LAB: POC INR: 1.8

## 2009-03-04 ENCOUNTER — Ambulatory Visit: Payer: Self-pay | Admitting: Internal Medicine

## 2009-03-04 LAB — CONVERTED CEMR LAB: POC INR: 2.6

## 2009-03-09 DIAGNOSIS — C50919 Malignant neoplasm of unspecified site of unspecified female breast: Secondary | ICD-10-CM

## 2009-03-09 HISTORY — DX: Malignant neoplasm of unspecified site of unspecified female breast: C50.919

## 2009-03-12 ENCOUNTER — Ambulatory Visit: Payer: Self-pay | Admitting: Internal Medicine

## 2009-03-15 ENCOUNTER — Telehealth: Payer: Self-pay | Admitting: Internal Medicine

## 2009-03-25 ENCOUNTER — Ambulatory Visit: Payer: Self-pay | Admitting: Cardiology

## 2009-03-29 ENCOUNTER — Ambulatory Visit: Payer: Self-pay | Admitting: Internal Medicine

## 2009-04-01 ENCOUNTER — Encounter: Payer: Self-pay | Admitting: Internal Medicine

## 2009-04-01 ENCOUNTER — Ambulatory Visit: Payer: Self-pay | Admitting: Cardiology

## 2009-04-01 LAB — CONVERTED CEMR LAB: POC INR: 2.1

## 2009-04-03 ENCOUNTER — Telehealth: Payer: Self-pay | Admitting: Internal Medicine

## 2009-04-04 ENCOUNTER — Ambulatory Visit (HOSPITAL_COMMUNITY): Admission: RE | Admit: 2009-04-04 | Discharge: 2009-04-04 | Payer: Self-pay | Admitting: Cardiology

## 2009-04-17 ENCOUNTER — Ambulatory Visit: Payer: Self-pay | Admitting: Cardiology

## 2009-04-17 LAB — CONVERTED CEMR LAB: POC INR: 2.2

## 2009-04-22 ENCOUNTER — Telehealth: Payer: Self-pay | Admitting: Internal Medicine

## 2009-04-22 ENCOUNTER — Ambulatory Visit: Payer: Self-pay | Admitting: Critical Care Medicine

## 2009-05-01 ENCOUNTER — Telehealth (INDEPENDENT_AMBULATORY_CARE_PROVIDER_SITE_OTHER): Payer: Self-pay | Admitting: *Deleted

## 2009-05-06 ENCOUNTER — Ambulatory Visit: Payer: Self-pay | Admitting: Cardiology

## 2009-05-20 ENCOUNTER — Encounter: Payer: Self-pay | Admitting: Internal Medicine

## 2009-05-27 ENCOUNTER — Ambulatory Visit: Payer: Self-pay | Admitting: Cardiology

## 2009-05-27 ENCOUNTER — Ambulatory Visit: Payer: Self-pay | Admitting: Internal Medicine

## 2009-05-31 LAB — CONVERTED CEMR LAB
Creatinine, Ser: 1 mg/dL (ref 0.4–1.2)
Magnesium: 2.2 mg/dL (ref 1.5–2.5)

## 2009-06-10 ENCOUNTER — Encounter: Payer: Self-pay | Admitting: Internal Medicine

## 2009-06-11 ENCOUNTER — Ambulatory Visit: Payer: Self-pay | Admitting: Internal Medicine

## 2009-06-11 LAB — CONVERTED CEMR LAB
Calcium: 9.3 mg/dL (ref 8.4–10.5)
GFR calc non Af Amer: 57.3 mL/min (ref >60)
POC INR: 1.6
Sodium: 139 meq/L (ref 135–145)

## 2009-06-18 ENCOUNTER — Encounter: Payer: Self-pay | Admitting: Critical Care Medicine

## 2009-06-24 ENCOUNTER — Ambulatory Visit: Payer: Self-pay | Admitting: Cardiology

## 2009-06-24 LAB — CONVERTED CEMR LAB: POC INR: 2.3

## 2009-06-28 ENCOUNTER — Ambulatory Visit: Payer: Self-pay | Admitting: Internal Medicine

## 2009-07-01 ENCOUNTER — Ambulatory Visit: Payer: Self-pay | Admitting: Critical Care Medicine

## 2009-07-01 ENCOUNTER — Telehealth: Payer: Self-pay | Admitting: Internal Medicine

## 2009-07-04 ENCOUNTER — Telehealth: Payer: Self-pay | Admitting: Internal Medicine

## 2009-07-10 ENCOUNTER — Telehealth: Payer: Self-pay | Admitting: Adult Health

## 2009-07-15 ENCOUNTER — Ambulatory Visit: Payer: Self-pay | Admitting: Cardiology

## 2009-07-15 LAB — CONVERTED CEMR LAB: POC INR: 2.4

## 2009-08-07 ENCOUNTER — Telehealth (INDEPENDENT_AMBULATORY_CARE_PROVIDER_SITE_OTHER): Payer: Self-pay | Admitting: *Deleted

## 2009-08-13 ENCOUNTER — Telehealth: Payer: Self-pay | Admitting: Cardiology

## 2009-08-13 ENCOUNTER — Ambulatory Visit: Payer: Self-pay | Admitting: Cardiology

## 2009-08-13 LAB — CONVERTED CEMR LAB: POC INR: 1.8

## 2009-08-19 ENCOUNTER — Telehealth: Payer: Self-pay | Admitting: Internal Medicine

## 2009-08-20 ENCOUNTER — Ambulatory Visit: Payer: Self-pay | Admitting: Cardiovascular Disease

## 2009-08-20 ENCOUNTER — Encounter: Payer: Self-pay | Admitting: Cardiovascular Disease

## 2009-08-22 ENCOUNTER — Ambulatory Visit: Payer: Self-pay | Admitting: Cardiovascular Disease

## 2009-08-22 ENCOUNTER — Ambulatory Visit (HOSPITAL_COMMUNITY): Admission: RE | Admit: 2009-08-22 | Discharge: 2009-08-22 | Payer: Self-pay | Admitting: Cardiovascular Disease

## 2009-08-23 ENCOUNTER — Telehealth: Payer: Self-pay | Admitting: Internal Medicine

## 2009-09-03 ENCOUNTER — Ambulatory Visit: Payer: Self-pay | Admitting: Internal Medicine

## 2009-09-03 LAB — CONVERTED CEMR LAB: POC INR: 3

## 2009-09-05 ENCOUNTER — Ambulatory Visit: Payer: Self-pay | Admitting: Internal Medicine

## 2009-09-05 DIAGNOSIS — I959 Hypotension, unspecified: Secondary | ICD-10-CM

## 2009-09-11 ENCOUNTER — Telehealth: Payer: Self-pay | Admitting: Internal Medicine

## 2009-09-16 ENCOUNTER — Telehealth (INDEPENDENT_AMBULATORY_CARE_PROVIDER_SITE_OTHER): Payer: Self-pay | Admitting: *Deleted

## 2009-09-18 ENCOUNTER — Encounter: Payer: Self-pay | Admitting: Cardiovascular Disease

## 2009-09-19 ENCOUNTER — Ambulatory Visit: Payer: Self-pay

## 2009-09-19 ENCOUNTER — Encounter: Payer: Self-pay | Admitting: Cardiovascular Disease

## 2009-09-19 ENCOUNTER — Ambulatory Visit: Payer: Self-pay | Admitting: Internal Medicine

## 2009-09-20 LAB — CONVERTED CEMR LAB
CO2: 29 meq/L (ref 19–32)
Calcium: 8.8 mg/dL (ref 8.4–10.5)
Sodium: 142 meq/L (ref 135–145)

## 2009-09-26 ENCOUNTER — Ambulatory Visit: Payer: Self-pay | Admitting: Cardiology

## 2009-09-27 ENCOUNTER — Encounter: Payer: Self-pay | Admitting: Internal Medicine

## 2009-10-03 ENCOUNTER — Ambulatory Visit: Payer: Self-pay | Admitting: Internal Medicine

## 2009-10-04 ENCOUNTER — Encounter: Payer: Self-pay | Admitting: Internal Medicine

## 2009-10-04 ENCOUNTER — Telehealth: Payer: Self-pay | Admitting: Internal Medicine

## 2009-10-07 ENCOUNTER — Ambulatory Visit: Payer: Self-pay | Admitting: Internal Medicine

## 2009-10-08 ENCOUNTER — Encounter: Payer: Self-pay | Admitting: Internal Medicine

## 2009-10-22 ENCOUNTER — Telehealth: Payer: Self-pay | Admitting: Internal Medicine

## 2009-10-24 ENCOUNTER — Ambulatory Visit: Payer: Self-pay | Admitting: Cardiovascular Disease

## 2009-10-24 LAB — CONVERTED CEMR LAB: POC INR: 2.2

## 2009-10-25 ENCOUNTER — Encounter: Payer: Self-pay | Admitting: Internal Medicine

## 2009-10-25 ENCOUNTER — Ambulatory Visit: Payer: Self-pay | Admitting: Critical Care Medicine

## 2009-10-29 ENCOUNTER — Telehealth (INDEPENDENT_AMBULATORY_CARE_PROVIDER_SITE_OTHER): Payer: Self-pay | Admitting: *Deleted

## 2009-11-01 ENCOUNTER — Encounter: Payer: Self-pay | Admitting: Critical Care Medicine

## 2009-11-04 ENCOUNTER — Ambulatory Visit: Payer: Self-pay | Admitting: Internal Medicine

## 2009-11-06 ENCOUNTER — Inpatient Hospital Stay (HOSPITAL_COMMUNITY): Admission: RE | Admit: 2009-11-06 | Discharge: 2009-11-07 | Payer: Self-pay | Admitting: Internal Medicine

## 2009-11-06 ENCOUNTER — Ambulatory Visit: Payer: Self-pay | Admitting: Internal Medicine

## 2009-11-07 LAB — CONVERTED CEMR LAB
Basophils Relative: 0.8 % (ref 0.0–3.0)
CO2: 28 meq/L (ref 19–32)
Calcium: 9.2 mg/dL (ref 8.4–10.5)
Eosinophils Relative: 1.9 % (ref 0.0–5.0)
GFR calc non Af Amer: 51.27 mL/min (ref >60)
Hemoglobin: 15 g/dL (ref 12.0–15.0)
INR: 2 — ABNORMAL HIGH (ref 0.8–1.0)
Lymphocytes Relative: 27.6 % (ref 12.0–46.0)
MCHC: 34.7 g/dL (ref 30.0–36.0)
Monocytes Relative: 8.1 % (ref 3.0–12.0)
Neutro Abs: 6.5 10*3/uL (ref 1.4–7.7)
RBC: 4.83 M/uL (ref 3.87–5.11)
Sodium: 144 meq/L (ref 135–145)

## 2009-11-08 ENCOUNTER — Telehealth: Payer: Self-pay | Admitting: Internal Medicine

## 2009-11-19 ENCOUNTER — Telehealth (INDEPENDENT_AMBULATORY_CARE_PROVIDER_SITE_OTHER): Payer: Self-pay | Admitting: *Deleted

## 2009-11-19 ENCOUNTER — Ambulatory Visit: Payer: Self-pay | Admitting: Cardiovascular Disease

## 2009-11-19 ENCOUNTER — Encounter: Payer: Self-pay | Admitting: Internal Medicine

## 2009-11-19 LAB — CONVERTED CEMR LAB: POC INR: 1.9

## 2009-11-29 ENCOUNTER — Encounter (INDEPENDENT_AMBULATORY_CARE_PROVIDER_SITE_OTHER): Payer: Self-pay | Admitting: *Deleted

## 2009-11-29 ENCOUNTER — Telehealth (INDEPENDENT_AMBULATORY_CARE_PROVIDER_SITE_OTHER): Payer: Self-pay | Admitting: *Deleted

## 2009-12-03 ENCOUNTER — Telehealth (INDEPENDENT_AMBULATORY_CARE_PROVIDER_SITE_OTHER): Payer: Self-pay | Admitting: *Deleted

## 2009-12-05 ENCOUNTER — Encounter: Admission: RE | Admit: 2009-12-05 | Discharge: 2009-12-05 | Payer: Self-pay | Admitting: Internal Medicine

## 2009-12-17 ENCOUNTER — Encounter: Admission: RE | Admit: 2009-12-17 | Discharge: 2009-12-17 | Payer: Self-pay | Admitting: Internal Medicine

## 2009-12-19 ENCOUNTER — Encounter: Payer: Self-pay | Admitting: Internal Medicine

## 2009-12-31 ENCOUNTER — Ambulatory Visit: Payer: Self-pay | Admitting: Cardiology

## 2009-12-31 ENCOUNTER — Ambulatory Visit: Payer: Self-pay | Admitting: Internal Medicine

## 2009-12-31 ENCOUNTER — Ambulatory Visit: Payer: Self-pay | Admitting: Critical Care Medicine

## 2009-12-31 DIAGNOSIS — J309 Allergic rhinitis, unspecified: Secondary | ICD-10-CM

## 2009-12-31 LAB — CONVERTED CEMR LAB: POC INR: 1.5

## 2010-01-01 ENCOUNTER — Telehealth: Payer: Self-pay | Admitting: Critical Care Medicine

## 2010-01-06 ENCOUNTER — Telehealth: Payer: Self-pay | Admitting: Critical Care Medicine

## 2010-01-13 ENCOUNTER — Ambulatory Visit: Payer: Self-pay | Admitting: Critical Care Medicine

## 2010-01-14 ENCOUNTER — Ambulatory Visit: Payer: Self-pay | Admitting: Internal Medicine

## 2010-01-14 LAB — CONVERTED CEMR LAB: POC INR: 3.8

## 2010-02-03 ENCOUNTER — Ambulatory Visit (HOSPITAL_COMMUNITY): Admission: RE | Admit: 2010-02-03 | Discharge: 2010-02-03 | Payer: Self-pay | Admitting: General Surgery

## 2010-02-03 ENCOUNTER — Encounter: Admission: RE | Admit: 2010-02-03 | Discharge: 2010-02-03 | Payer: Self-pay | Admitting: General Surgery

## 2010-02-03 DIAGNOSIS — Z170 Estrogen receptor positive status [ER+]: Secondary | ICD-10-CM

## 2010-02-03 DIAGNOSIS — C50212 Malignant neoplasm of upper-inner quadrant of left female breast: Secondary | ICD-10-CM

## 2010-02-03 HISTORY — PX: MASTECTOMY, PARTIAL: SHX709

## 2010-02-06 ENCOUNTER — Ambulatory Visit: Payer: Self-pay | Admitting: Oncology

## 2010-02-07 ENCOUNTER — Ambulatory Visit: Payer: Self-pay | Admitting: Cardiovascular Disease

## 2010-02-07 LAB — CONVERTED CEMR LAB: POC INR: 1.2

## 2010-02-10 ENCOUNTER — Ambulatory Visit: Payer: Self-pay | Admitting: Internal Medicine

## 2010-02-11 ENCOUNTER — Ambulatory Visit: Payer: Self-pay | Admitting: Cardiovascular Disease

## 2010-02-11 LAB — CONVERTED CEMR LAB: POC INR: 1.7

## 2010-02-17 ENCOUNTER — Encounter: Payer: Self-pay | Admitting: Internal Medicine

## 2010-02-18 ENCOUNTER — Ambulatory Visit: Payer: Self-pay | Admitting: Internal Medicine

## 2010-02-18 LAB — CONVERTED CEMR LAB: INR: 2.1

## 2010-02-19 ENCOUNTER — Encounter: Payer: Self-pay | Admitting: Internal Medicine

## 2010-02-20 ENCOUNTER — Ambulatory Visit (HOSPITAL_COMMUNITY)
Admission: RE | Admit: 2010-02-20 | Discharge: 2010-02-20 | Payer: Self-pay | Source: Home / Self Care | Attending: Oncology | Admitting: Oncology

## 2010-02-21 ENCOUNTER — Encounter: Payer: Self-pay | Admitting: Internal Medicine

## 2010-02-21 ENCOUNTER — Ambulatory Visit: Payer: Self-pay | Admitting: Internal Medicine

## 2010-02-21 DIAGNOSIS — M1710 Unilateral primary osteoarthritis, unspecified knee: Secondary | ICD-10-CM | POA: Insufficient documentation

## 2010-02-21 DIAGNOSIS — M5430 Sciatica, unspecified side: Secondary | ICD-10-CM

## 2010-02-21 DIAGNOSIS — Z950 Presence of cardiac pacemaker: Secondary | ICD-10-CM

## 2010-02-21 DIAGNOSIS — Z8673 Personal history of transient ischemic attack (TIA), and cerebral infarction without residual deficits: Secondary | ICD-10-CM

## 2010-02-21 DIAGNOSIS — C50919 Malignant neoplasm of unspecified site of unspecified female breast: Secondary | ICD-10-CM | POA: Insufficient documentation

## 2010-02-26 ENCOUNTER — Encounter: Payer: Self-pay | Admitting: Internal Medicine

## 2010-03-06 ENCOUNTER — Ambulatory Visit: Payer: Self-pay | Admitting: Cardiology

## 2010-03-06 LAB — CONVERTED CEMR LAB: POC INR: 3.6

## 2010-03-10 ENCOUNTER — Encounter: Payer: Self-pay | Admitting: Internal Medicine

## 2010-03-13 ENCOUNTER — Telehealth (INDEPENDENT_AMBULATORY_CARE_PROVIDER_SITE_OTHER): Payer: Self-pay | Admitting: *Deleted

## 2010-03-13 ENCOUNTER — Telehealth: Payer: Self-pay | Admitting: Internal Medicine

## 2010-03-17 ENCOUNTER — Encounter: Payer: Self-pay | Admitting: Internal Medicine

## 2010-03-20 ENCOUNTER — Telehealth (INDEPENDENT_AMBULATORY_CARE_PROVIDER_SITE_OTHER): Payer: Self-pay | Admitting: *Deleted

## 2010-03-21 ENCOUNTER — Telehealth (INDEPENDENT_AMBULATORY_CARE_PROVIDER_SITE_OTHER): Payer: Self-pay | Admitting: *Deleted

## 2010-03-21 ENCOUNTER — Ambulatory Visit: Admission: RE | Admit: 2010-03-21 | Discharge: 2010-03-21 | Payer: Self-pay | Source: Home / Self Care

## 2010-03-21 LAB — CONVERTED CEMR LAB: POC INR: 2.6

## 2010-03-25 ENCOUNTER — Other Ambulatory Visit: Payer: Self-pay | Admitting: Dermatology

## 2010-03-29 ENCOUNTER — Encounter: Payer: Self-pay | Admitting: Critical Care Medicine

## 2010-03-30 ENCOUNTER — Encounter: Payer: Self-pay | Admitting: Internal Medicine

## 2010-03-31 ENCOUNTER — Encounter
Admission: RE | Admit: 2010-03-31 | Discharge: 2010-03-31 | Payer: Self-pay | Source: Home / Self Care | Attending: Oncology | Admitting: Oncology

## 2010-04-03 ENCOUNTER — Ambulatory Visit: Payer: Self-pay | Admitting: Oncology

## 2010-04-06 LAB — CONVERTED CEMR LAB
BUN: 18 mg/dL (ref 6–23)
Calcium: 9.1 mg/dL (ref 8.4–10.5)
Creatinine, Ser: 1 mg/dL (ref 0.4–1.2)
Eosinophils Relative: 2.4 % (ref 0.0–5.0)
GFR calc non Af Amer: 55.97 mL/min (ref >60)
Lymphocytes Relative: 26.4 % (ref 12.0–46.0)
MCV: 88.9 fL (ref 78.0–100.0)
Monocytes Absolute: 0.8 10*3/uL (ref 0.1–1.0)
Monocytes Relative: 8.3 % (ref 3.0–12.0)
Neutrophils Relative %: 62.4 % (ref 43.0–77.0)
POC INR: 2.9
Platelets: 201 10*3/uL (ref 150.0–400.0)
WBC: 10.1 10*3/uL (ref 4.5–10.5)

## 2010-04-07 ENCOUNTER — Ambulatory Visit: Admission: RE | Admit: 2010-04-07 | Discharge: 2010-04-07 | Payer: Self-pay | Source: Home / Self Care

## 2010-04-07 ENCOUNTER — Encounter: Payer: Self-pay | Admitting: Internal Medicine

## 2010-04-07 LAB — CBC WITH DIFFERENTIAL/PLATELET
Eosinophils Absolute: 0.2 10*3/uL (ref 0.0–0.5)
LYMPH%: 32.5 % (ref 14.0–49.7)
MONO#: 0.6 10*3/uL (ref 0.1–0.9)
NEUT#: 5.2 10*3/uL (ref 1.5–6.5)
Platelets: 205 10*3/uL (ref 145–400)
RBC: 4.78 10*6/uL (ref 3.70–5.45)
RDW: 16.1 % — ABNORMAL HIGH (ref 11.2–14.5)
WBC: 9.1 10*3/uL (ref 3.9–10.3)

## 2010-04-08 LAB — VITAMIN D 25 HYDROXY (VIT D DEFICIENCY, FRACTURES): Vit D, 25-Hydroxy: 40 ng/mL (ref 30–89)

## 2010-04-10 NOTE — Assessment & Plan Note (Signed)
Summary: device/ also s/p ablation @ [facility]   Primary Provider:  Dr [name] . Polite  CC:  device and follow up on ablation.  History of Present Illness: [name] is seen in followup for paroxysmal atrial fibrillation associated with a rapid ventricular response most recently complicated by congestive heart failure. This is manifested as edema both of   her abdomen as well as her extremities and significant shortness of breath.  This is significantly improved with diuresis over the last 10 days or so.  she has just undergone pulmonary vein isolation by Dr. Macon Large done at [facility].  This is accompanied 02/20/2009  she was restarted on Tikosyn therapy. She is not sure that she felt symptomatically better around the time of her procedure. She comes in today unaware of recurrent tachyarrhythmia.  There has been no edema. Her fatigue is stable. She is smoking much less down to just a few cigarettes a day she says.  I spoke with Dr. Macon Large by phone as her discharge summary was not available for review. He agreed with the to be outlined plan     Current Medications (verified): 1)  Klor-Con 20 Meq  Pack (Potassium Chloride) .... Take 1 Tablet in The Morning and 2at Night 2)  Advair Diskus 250-50 Mcg/dose  Misc (Fluticasone-Salmeterol) .... One Puff Two Times A Day 3)  Spiriva Handihaler 18 Mcg  Caps (Tiotropium Bromide Monohydrate) .... One Capsule Once Daily 4)  Nasonex 50 Mcg/act  Susp (Mometasone Furoate) .... Two Puff Ea Nostril Once Daily 5)  Pletal 100 Mg  Tabs (Cilostazol) .... Take 1 Tablet By Mouth Twice Daily 6)  Nexium 40 Mg  Cpdr (Esomeprazole Magnesium) .... By Mouth Daily. Take One Half Hour Before Eating. 7)  Mucinex 600 Mg  Xr12h-Tab (Guaifenesin) .... Two By Mouth Two Times A Day 8)  Lipitor 20 Mg  Tabs (Atorvastatin Calcium) .... Once Daily 9)  Glucosamine 1500 Complex   Caps (Glucosamine-Chondroit-Vit C-Mn) .... Two Times A Day 10)  Cardizem 120 Mg Tabs (Diltiazem Hcl) .Marland Kitchen.. 1  Tab in Am 2 Tabs in Pm 11)  Fe-Caps 250 Mg Cr-Caps (Ferrous Sulfate) .... One Daily 12)  Xopenex Hfa 45 Mcg/act Aero (Levalbuterol Tartrate) .... 2 Puffs Every 6 Hours As Needed 13)  Furosemide 40 Mg Tabs (Furosemide) .... Take 2  Tab Two Times A Day 14)  Spironolactone 25 Mg Tabs (Spironolactone) .... Take One Tablet Once Daily 15)  Bayer Low Strength 81 Mg Tbec (Aspirin) .Marland Kitchen.. 1 By Mouth Daily 16)  Magnesium .Marland Kitchen.. 1 By Mouth Daiky 17)  Oxygen .... 2l At Bedtime 18)  Glipizide 5 Mg Tabs (Glipizide) .Marland Kitchen.. 1 1/2 Once Daily 19)  Fish Oil 1000 Mg Caps (Omega-3 Fatty Acids) .... Take 1 Capsule By Mouth Two Times A Day 20)  Warfarin Sodium 5 Mg Tabs (Warfarin Sodium) .... Use As Directed By Anticoagulation Clinic 21)  Tikosyn 250 Mcg Caps (Dofetilide) .... Take One Capsule Every 12 Hours  Allergies (verified): No Known Drug Allergies  Past History:  Past Medical History: Last updated: 01/15/2009 HYPERSOMNIA, ASSOCIATED WITH SLEEP APNEA (ICD-780.53) Amiodarone Lung Toxicity RENAL DISEASE, CHRONIC  Creatinine 1.11 January 2009 CVA (ICD-434.91) DM (ICD-250.00) cigarette abuse  DIASTOLIC HEART FAILURE, ACUTE ON CHRONIC (ICD-428.33) PACEMAKER, PERMANENT   ATRIAL FIBRILLATION (ICD-427.31)       -AV ablation 9/09 [facility] per Dr Sampson Goon  HYPERLIPIDEMIA (ICD-272.4) GERD (ICD-530.81) CORONARY ARTERY DISEASE not sure of htis 11/10 COPD (ICD-496)     -FeV1 73%  DLCO 53% 5/09  Family  History: Last updated: 06/01/2008 That mother died of unknown cause.  Father died   with heart disease.  The patient has two sisters who are alive and well.   No brothers.   Social History: Last updated: 06/01/2008 1/2 cup decaff  Tai-chi & water aerobics - garden, active lifestyle Retired  Married  Tobacco Use - Yes.  Alcohol Use - yes  Past Surgical History: Pulmonary vein isolation x2 once at [facility] and once at [facility]  Vital Signs:  Patient profile:   77 year old female Height:      65.5  inches Weight:      190 pounds BMI:     31.25 Pulse rate:   129 / minute Pulse rhythm:   regular BP sitting:   102 / 58  (right arm) Cuff size:   large  Vitals Entered By: Judithe Modest CMA (March 12, 2009 3:49 PM)  Physical Exam  General:  The patient was alert and oriented in no acute distress. HEENT Normal.  Neck veins were flat, carotids were brisk.  Lungs were clear.  Heart sounds were regular without murmurs or gallops.  Abdomen was soft with active bowel sounds. There is no clubbing cyanosis or edema. Skin Warm and dry Neuro grossly normal    PPM Specifications Following MD:  Sherryl Manges, MD     PPM Vendor:  [employer]     PPM Model Number:  [id number]     PPM Serial Number:  [id number] PPM DOI:  01/23/2004     PPM Implanting MD:  Sherryl Manges, MD  Lead 1    Location: [facility]     DOI: 01/23/2004     Model #: [id number]     Serial #: [id number]     Status: active Lead 2    Location: [facility]     DOI: 01/23/2004     Model #: [id number]     Serial #: [id number]     Status: active  Magnet Response Rate:  BOL 98.6  Indications:  TACHY BRADY SYNDROME   PPM Follow Up Pacer Dependent:  No      Episodes Coumadin:  Yes  Parameters Mode:  DDIR     Lower Rate Limit:  70     Upper Rate Limit:  90 Paced AV Delay:  275     Sensed AV Delay:  250  Impression & Recommendations:  Problem # 1:  ATRIAL FIBRILLATION (ICD-427.31) s/p atrial fibrillation ablation with a second  procedure last week Has recurrence of atypical atrial flutter and after discussion with Diuke, will proceed with "DCCV  In the event that she fails to hold sinus rhtym we might try a Ic agent  She will need to stay on coumadin for now We spent more that 20 minutes of our 35 minute visit discussing the  implications of recurrent atrila arrhythmia following catheter ablation  Orders: EKG w/ Interpretation (93000)  Problem # 2:  DIASTOLIC HEART FAILURE, ACUTE ON CHRONIC (ICD-428.33) relatively stable on her complex regime.  I suggested that we  decrease her spironolactone to 12.5 mg daily  Problem # 3:  CORONARY ARTERY DISEASE (ICD-414.00) stable The following medications were removed from the medication list:    Lovenox 80 Mg/0.24ml Soln (Enoxaparin sodium) ..... Inject one syringe subcutaneouslyinto abdomen twice daily 12hrs apart Her updated medication list for this problem includes:    Pletal 100 Mg Tabs (Cilostazol) .Marland Kitchen... Take 1 tablet by mouth twice daily    Cardizem 120 Mg Tabs (Diltiazem hcl) .Marland KitchenMarland KitchenMarland KitchenMarland Kitchen  1 tab in am 2 tabs in pm    Bayer Low Strength 81 Mg Tbec (Aspirin) .Marland Kitchen... 1 by mouth daily    Warfarin Sodium 5 Mg Tabs (Warfarin sodium) ..... Use as directed by anticoagulation clinic

## 2010-04-10 NOTE — Medication Information (Signed)
Summary: ccr/jss  Anticoagulant Therapy  Managed by: Elaina Pattee, PharmD Referring MD: Sherryl Manges MD PCP: Dr [name] . Polite Supervising MD: Graciela Husbands MD, Viviann Spare Indication 1: Atrial Fibrillation (ICD-427.31) Lab Used: [facility] Site: [facility] INR POC 1.6 INR RANGE 2.0-3.0  Dietary changes: yes       Details: Diet varies regarding green vegetables. Husband does cooking.  Health status changes: no    Bleeding/hemorrhagic complications: no    Recent/future hospitalizations: no    Any changes in medication regimen? no    Recent/future dental: no  Any missed doses?: no       Is patient compliant with meds? yes       Allergies: No Known Drug Allergies  Anticoagulation Management History:      The patient is taking warfarin and comes in today for a routine follow up visit.  Positive risk factors for bleeding include an age of 75 years or older, history of CVA/TIA, and presence of serious comorbidities.  The bleeding index is 'high risk'.  Positive CHADS2 values include History of CHF, Age > 29 years old, History of Diabetes, and Prior Stroke/CVA/TIA.  The start date was 01/20/2004.  Her last INR was 2.2 RATIO.  Anticoagulation responsible provider: Graciela Husbands MD, Viviann Spare.  INR POC: 1.6.  Cuvette Lot#: [id number].  Exp: 07/2010.    Anticoagulation Management Assessment/Plan:      The patient's current anticoagulation dose is Warfarin sodium 5 mg tabs: Use as directed by Anticoagulation Clinic.  The target INR is 2.0-3.0.  The next INR is due 06/24/2009.  Anticoagulation instructions were given to patient.  Results were reviewed/authorized by Elaina Pattee, PharmD.  She was notified by Elaina Pattee, PharmD.         Prior Anticoagulation Instructions: INR 1.7 Today take 10mg s then change dose to 7.5mg s daily except 5mg s on Tuesdays and Saturdays. Recheck in 2 weeks.   Current Anticoagulation Instructions: INR 1.6. Take 2 tablets today and tomorrow, then take 1.5 tablets daily  except 1 tablet on Saturdays. Recheck in 10-14 days.

## 2010-04-10 NOTE — Progress Notes (Signed)
Summary: pt needs refill  Phone Note Refill Request Message from:  [facility]  Refills Requested: Medication #1:  ciloseazol 100mg  bid Initial call taken by: Omer Jack,  November 29, 2009 12:38 PM  Follow-up for Phone Call        Rx faxed to pharmacy. Vikki Ports  November 29, 2009 3:34 PM

## 2010-04-10 NOTE — Medication Information (Signed)
Summary: ccr/ gd  Anticoagulant Therapy  Managed by: Cloyde Reams, RN, BSN Referring MD: Sherryl Manges MD PCP: Dr [name] . Polite Supervising MD: Myrtis Ser MD, Tinnie Gens Indication 1: Atrial Fibrillation (ICD-427.31) Lab Used: [facility] Site: [facility] INR POC 2.1 INR RANGE 2.0-3.0  Dietary changes: no    Health status changes: no    Bleeding/hemorrhagic complications: yes    Recent/future hospitalizations: no    Any changes in medication regimen? no    Recent/future dental: no  Any missed doses?: no       Is patient compliant with meds? yes       Allergies: No Known Drug Allergies  Anticoagulation Management History:      The patient is taking warfarin and comes in today for a routine follow up visit.  Positive risk factors for bleeding include an age of 75 years or older, history of CVA/TIA, and presence of serious comorbidities.  The bleeding index is 'high risk'.  Positive CHADS2 values include History of CHF, Age > 74 years old, History of Diabetes, and Prior Stroke/CVA/TIA.  The start date was 01/20/2004.  Her last INR was 2.2 RATIO.  Anticoagulation responsible provider: Myrtis Ser MD, Tinnie Gens.  INR POC: 2.1.  Cuvette Lot#: [id number].  Exp: 10/2010.    Anticoagulation Management Assessment/Plan:      The patient's current anticoagulation dose is Warfarin sodium 5 mg tabs: Use as directed by Anticoagulation Clinic.  The target INR is 2.0-3.0.  The next INR is due 10/24/2009.  Anticoagulation instructions were given to patient.  Results were reviewed/authorized by Cloyde Reams, RN, BSN.  She was notified by Cloyde Reams RN.         Prior Anticoagulation Instructions: INR 3.0. Take 1 tablet today, then take 1.5 tablets daily except 1 tablet on Saturdays. Recheck in 3 weeks.  Current Anticoagulation Instructions: INR 2.1  Continue on same dosage 1.5 tablets daily except 1 tablet on Saturdays.  Recheck in 4 weeks.

## 2010-04-10 NOTE — Medication Information (Signed)
Summary: rov/tm  Anticoagulant Therapy  Managed by: Bethena Midget, RN, BSN Referring MD: Sherryl Manges MD PCP: Dr [name] . Polite Supervising MD: Graciela Husbands MD, Viviann Spare Indication 1: Atrial Fibrillation (ICD-427.31) Indication 2: Cerebrovascular Accident Lab Used: [facility] Site: [facility] INR POC 3.8 INR RANGE 2.0-3.0  Dietary changes: no    Health status changes: no    Bleeding/hemorrhagic complications: no    Recent/future hospitalizations: yes       Details: 02/03/10 Left Breast Lumpectomy  Any changes in medication regimen? yes       Details: Claritin PRN, Prednisone tapering dose for 12 days  finished dose on Sunday.   Recent/future dental: no  Any missed doses?: yes     Details: Pt thinks she didn't due dose change of Saturdays. She states she's really not sure of what she did.   Is patient compliant with meds? yes      Comments: 02/03/10 Lumpectomy of left Breast due to stop coumadin on 11/23.  Per Dr. Graciela Husbands, needs Lovenox bridging.  Weight- 84kg, SCr- 1.1, CrCl>75mL/min.  Will dose Lovenox at 1.5mg /kg/day   Current Medications (verified): 1)  Potassium Chloride Crys Cr 20 Meq Cr-Tabs (Potassium Chloride Crys Cr) .... 2 Every Am and 3 Every Pm 2)  Advair Diskus 250-50 Mcg/dose  Misc (Fluticasone-Salmeterol) .... One Puff Two Times A Day 3)  Spiriva Handihaler 18 Mcg  Caps (Tiotropium Bromide Monohydrate) .... One Capsule Once Daily 4)  Pletal 100 Mg  Tabs (Cilostazol) .... Take 1 Tablet By Mouth Two Times A Day 5)  Nexium 40 Mg  Cpdr (Esomeprazole Magnesium) .... Take One Half Hour Before Eating. Take One Tablet By Mouth Every Other Day 6)  Mucinex 600 Mg  Xr12h-Tab (Guaifenesin) .... One By Mouth Two Times A Day 7)  Lipitor 20 Mg  Tabs (Atorvastatin Calcium) .... Once Daily 8)  Glucosamine 1500 Complex   Caps (Glucosamine-Chondroit-Vit C-Mn) .... Two Times A Day 9)  Fe-Caps 250 Mg Cr-Caps (Ferrous Sulfate) .... One Daily 10)  Xopenex Hfa 45 Mcg/act Aero (Levalbuterol  Tartrate) .... 2 Puffs Every 6 Hours As Needed 11)  Furosemide 40 Mg Tabs (Furosemide) .... Take 2  Tab Two Times A Day 12)  Bayer Low Strength 81 Mg Tbec (Aspirin) .Marland Kitchen.. 1 By Mouth Daily 13)  Magnesium 250 Mg Tabs (Magnesium) .... Take 1 Tablet By Mouth Once A Day 14)  Glipizide 5 Mg Tabs (Glipizide) .Marland Kitchen.. 1 1/2 Once Daily 15)  Fish Oil 1000 Mg Caps (Omega-3 Fatty Acids) .... Take 1 Capsule By Mouth Two Times A Day 16)  Warfarin Sodium 5 Mg Tabs (Warfarin Sodium) .... Use As Directed By Anticoagulation Clinic 17)  Enoxaparin Sodium 120 Mg/0.80ml Soln (Enoxaparin Sodium) .... Inject 1 Syringe Subcutaneously Once Daily As Directed  Allergies: No Known Drug Allergies  Anticoagulation Management History:      The patient is taking warfarin and comes in today for a routine follow up visit.  Positive risk factors for bleeding include an age of 14 years or older, history of CVA/TIA, and presence of serious comorbidities.  The bleeding index is 'high risk'.  Positive CHADS2 values include History of CHF, Age > 46 years old, History of Diabetes, and Prior Stroke/CVA/TIA.  The start date was 01/20/2004.  Her last INR was 2.0 ratio.  Anticoagulation responsible provider: Graciela Husbands MD, Viviann Spare.  INR POC: 3.8.  Cuvette Lot#: [id number].  Exp: 01/2011.    Anticoagulation Management Assessment/Plan:      The patient's current anticoagulation dose is Warfarin sodium  5 mg tabs: Use as directed by Anticoagulation Clinic.  The target INR is 2.0-3.0.  The next INR is due 02/07/2010.  Anticoagulation instructions were given to patient.  Results were reviewed/authorized by Bethena Midget, RN, BSN.  She was notified by Bethena Midget, RN, BSN.         Prior Anticoagulation Instructions: INR 1.5 Today take 10mg s then change dose to 7.5mg s everyday. Recheck in 2 weeks.   Current Anticoagulation Instructions: INR 3.8 Skip today's dose then Change dose  back to 7.5mg s daily except 5mg s on Saturdays.  11/23- Take last dose of  Coumadin 11/24- No Coumadin or Lovenox 11/25- Start Lovenox 120mg  injection once daily in the AM 11/26-11/27- Continue Lovenox once daily 11/28- Procedure.  Follow MDs orders for when to restart Coumadin and Lovenox.  When okay to start Coumadin, take 2 tablets (10mg ) for 2 days then resume normal dose of 7.5mg  every day except 5mg  on Saturday.  Continue Lovenox 120mg  once daily until INR >2.0.   Prescriptions: ENOXAPARIN SODIUM 120 MG/0.8ML SOLN (ENOXAPARIN SODIUM) Inject 1 syringe subcutaneously once daily as directed  #10 x 0   Entered by:   Bethena Midget, RN, BSN   Authorized by:   Nathen May, MD, [facility]   Signed by:   Bethena Midget, RN, BSN on 1January 07, 202011   Method used:   [facility] to        [facility] [address] [phone][facility] (retail)       [address]       [place], [place]  [address]       Ph: [phone] or [phone]       Fax: [phone]   RxID:   [id number]

## 2010-04-10 NOTE — Assessment & Plan Note (Signed)
Summary: Pulmonary OV   Primary Provider/Referring Provider:  Dr [name] . Polite  CC:  Pt herer for follow. Pt c/o S.O.B with activity and productive cough with small amounts of clear to white to brown mucus.  History of Present Illness:   77 year [name], white Tiffany [name] with history of chronic obstructive lung disease, asthmatic bronchitis, and obstructive sleep apnea.   Active smoker and has failed smoking cessation on multiple attempts.   February 12, 2009 10:48 AM To see EP MD at [facility].  Having more PAF.  For a potential ablation.  Had prior ablation at [facility]. Recently cough ok. Had diuretics increased 10/10.  Still on same doses. Had a stress test but not able to complete due to hypotension.  Stopped tikosyn and on Dumont and it makes pt nauseated.  The weight is up.  The pt notes less edema  April 22, 2009 2:17 PM Had lots of salt over the past 2/Tiffany/Tiffany weekend so is up on the weight.  The pt is not having much cough now.  The pt is  still dyspneic with exertion.  There is no chest pain.  The pt did not undergo DCCV.  She is on Tikosyn as of now for Afib.   The Multaq was stopped.  She did not get a repeat ablation at [facility].  Preventive Screening-Counseling & Management  Alcohol-Tobacco     Smoking Status: current     Packs/Day: 0.5  Current Medications (verified): 1)  Klor-Con 20 Meq  Pack (Potassium Chloride) .... Take 1 Tablet in The Morning and 2at Night 2)  Advair Diskus 250-50 Mcg/dose  Misc (Fluticasone-Salmeterol) .... One Puff Two Times A Day 3)  Spiriva Handihaler 18 Mcg  Caps (Tiotropium Bromide Monohydrate) .... One Capsule Once Daily 4)  Nasonex 50 Mcg/act  Susp (Mometasone Furoate) .... Two Puff Ea Nostril Once Daily 5)  Pletal 100 Mg  Tabs (Cilostazol) .... Take 1 Tablet By Mouth Once A Day 6)  Nexium 40 Mg  Cpdr (Esomeprazole Magnesium) .... By Mouth Daily. Take One Half Hour Before Eating. 7)  Mucinex 600 Mg  Xr12h-Tab (Guaifenesin) .... One By Mouth Two Times A  Day 8)  Lipitor 20 Mg  Tabs (Atorvastatin Calcium) .... Once Daily 9)  Glucosamine 1500 Complex   Caps (Glucosamine-Chondroit-Vit C-Mn) .... Two Times A Day 10)  Cardizem 120 Mg Tabs (Diltiazem Hcl) .Marland Kitchen.. 1 Tab in Am 2 Tabs in Pm Tiffany)  Fe-Caps 250 Mg Cr-Caps (Ferrous Sulfate) .... One Daily 12)  Xopenex Hfa 45 Mcg/act Aero (Levalbuterol Tartrate) .... 2 Puffs Every 6 Hours As Needed 13)  Furosemide 40 Mg Tabs (Furosemide) .... Take 2  Tab Two Times A Day 14)  Spironolactone 25 Mg Tabs (Spironolactone) .... Take One Tablet Once Daily 15)  Bayer Low Strength 81 Mg Tbec (Aspirin) .Marland Kitchen.. 1 By Mouth Daily 16)  Magnesium .Marland Kitchen.. 1 By Mouth Daiky 17)  Oxygen .... 2l At Bedtime 18)  Glipizide 5 Mg Tabs (Glipizide) .Marland Kitchen.. 1 1/2 Once Daily 19)  Fish Oil 1000 Mg Caps (Omega-3 Fatty Acids) .... Take 1 Capsule By Mouth Two Times A Day 20)  Warfarin Sodium 5 Mg Tabs (Warfarin Sodium) .... Use As Directed By Anticoagulation Clinic 21)  Tikosyn 250 Mcg Caps (Dofetilide) .... Take One Capsule Every 12 Hours  Allergies (verified): No Known Drug Allergies  Past History:  Past medical, surgical, family and social histories (including risk factors) reviewed, and no changes noted (except as noted below).  Past Medical History: Reviewed history from Tiffany/11/2008  and no changes required. HYPERSOMNIA, ASSOCIATED WITH SLEEP APNEA (ICD-780.53) Amiodarone Lung Toxicity RENAL DISEASE, CHRONIC  Creatinine 1.11 January 2009 CVA (ICD-434.91) DM (ICD-250.00) cigarette abuse  DIASTOLIC HEART FAILURE, ACUTE ON CHRONIC (ICD-428.33) PACEMAKER, PERMANENT   ATRIAL FIBRILLATION (ICD-427.31)       -AV ablation 9/09 [facility] per Dr Sampson Goon  HYPERLIPIDEMIA (ICD-272.4) GERD (ICD-530.81) CORONARY ARTERY DISEASE not sure of htis Tiffany/10 COPD (ICD-496)     -FeV1 73%  DLCO 53% 5/09  Past Surgical History: Reviewed history from 03/12/2009 and no changes required. Pulmonary vein isolation x2 once at [facility] and once at [facility]  Past  Pulmonary History:  Pulmonary History: COPD     -FeV1 73%  DLCO 53% 5/09  Family History: Reviewed history from 06/01/2008 and no changes required. That mother died of unknown cause.  Father died   with heart disease.  The patient has two sisters who are alive and well.   No brothers.   Social History: Reviewed history from 06/01/2008 and no changes required. 1/2 cup decaff  Tai-chi & water aerobics - garden, active lifestyle Retired  Married  Tobacco Use - Yes.  Alcohol Use - yes Packs/Day:  0.5  Review of Systems       The patient complains of shortness of breath with activity and non-productive cough.  The patient denies shortness of breath at rest, productive cough, coughing up blood, chest pain, irregular heartbeats, acid heartburn, indigestion, loss of appetite, weight change, abdominal pain, difficulty swallowing, sore throat, tooth/dental problems, headaches, nasal congestion/difficulty breathing through nose, sneezing, itching, ear ache, anxiety, depression, hand/feet swelling, joint stiffness or pain, rash, change in color of mucus, and fever.    Vital Signs:  Patient profile:   77 year [name] Tiffany [name] Height:      65.5 inches Weight:      192.38 pounds O2 Sat:      96 % on Room air Temp:     98.0 degrees F oral Pulse rate:   94 / minute BP sitting:   102 / 72  (left arm) Cuff size:   regular  Vitals Entered By: Zackery Barefoot CMA (April 22, 2009 2:02 PM)  O2 Flow:  Room air CC: Pt herer for follow. Pt c/o S.O.B with activity, productive cough with small amounts of clear to white to brown mucus Comments Medications reviewed with patient Verified pt's contact number Zackery Barefoot CMA  April 22, 2009 2:03 PM    Physical Exam  Additional Exam:  Gen: Pleasant, well nourished, in no distress, on supp oxygen ENT: no lesions, no postnasal drip Neck: No JVD, no TMG, no carotid bruits Lungs: no use of accessory muscles, no dullness to percussion, distant BS,  poor airflow Cardiovascular: RRR, heart sounds normal, no murmurs or gallops, 1+  peripheral edema Musculoskeletal: No deformities, no cyanosis or clubbing  ABD:  increased ascites     Impression & Recommendations:  Problem # 1:  COPD (ICD-496) Assessment Unchanged  copd no changes, still ongoing tobacco abuse  plan cont inhaled meds  Medications Added to Medication List This Visit: 1)  Nasonex 50 Mcg/act Susp (Mometasone furoate) .... Two puff ea nostril once daily as needed 2)  Pletal 100 Mg Tabs (Cilostazol) .... Take 1 tablet by mouth once a day 3)  Mucinex 600 Mg Xr12h-tab (Guaifenesin) .... One by mouth two times a day  Complete Medication List: 1)  Klor-con 20 Meq Pack (Potassium chloride) .... Take 1 tablet in the morning and 2at night 2)  Advair Diskus 250-50  Mcg/dose Misc (Fluticasone-salmeterol) .... One puff two times a day 3)  Spiriva Handihaler 18 Mcg Caps (Tiotropium bromide monohydrate) .... One capsule once daily 4)  Nasonex 50 Mcg/act Susp (Mometasone furoate) .... Two puff ea nostril once daily as needed 5)  Pletal 100 Mg Tabs (Cilostazol) .... Take 1 tablet by mouth once a day 6)  Nexium 40 Mg Cpdr (Esomeprazole magnesium) .... By mouth daily. take one half hour before eating. 7)  Mucinex 600 Mg Xr12h-tab (Guaifenesin) .... One by mouth two times a day 8)  Lipitor 20 Mg Tabs (Atorvastatin calcium) .... Once daily 9)  Glucosamine 1500 Complex Caps (Glucosamine-chondroit-vit c-mn) .... Two times a day 10)  Cardizem 120 Mg Tabs (Diltiazem hcl) .Marland Kitchen.. 1 tab in am 2 tabs in pm Tiffany)  Fe-caps 250 Mg Cr-caps (Ferrous sulfate) .... One daily 12)  Xopenex Hfa 45 Mcg/act Aero (Levalbuterol tartrate) .... 2 puffs every 6 hours as needed 13)  Furosemide 40 Mg Tabs (Furosemide) .... Take 2  tab two times a day 14)  Spironolactone 25 Mg Tabs (Spironolactone) .... Take one tablet once daily 15)  Bayer Low Strength 81 Mg Tbec (Aspirin) .Marland Kitchen.. 1 by mouth daily 16)  Magnesium  .Marland KitchenMarland Kitchen.  1 by mouth daiky 17)  Oxygen  .... 2l at bedtime 18)  Glipizide 5 Mg Tabs (Glipizide) .Marland Kitchen.. 1 1/2 once daily 19)  Fish Oil 1000 Mg Caps (Omega-3 fatty acids) .... Take 1 capsule by mouth two times a day 20)  Warfarin Sodium 5 Mg Tabs (Warfarin sodium) .... Use as directed by anticoagulation clinic 21)  Tikosyn 250 Mcg Caps (Dofetilide) .... Take one capsule every 12 hours  Other Orders: Est. Patient Level III ([address])  Patient Instructions: 1)  No change in medications except may use nasonex as needed 2)  Return [facility]3 [facility] at [facility]    Appended Document: Pulmonary OV fax ron polite

## 2010-04-10 NOTE — Progress Notes (Signed)
Summary: pt wants to talk to Dr. Graciela Husbands today  Phone Note Call from Patient Call back at Home Phone [phone]   [name]: Patient Reason for Call: Talk to Nurse, Talk to Doctor Summary of Call: pt wants to talk to Dr. Graciela Husbands today she has several questions regarding an operation she is to have on the 1st Initial call taken by: Omer Jack,  October 22, 2009 11:55 AM  Follow-up for Phone Call        CALLED SPOKE WITH PT'S HUSBAND NOT HOME AT THIS TIME EXPECTING HER BACK AROUND 3:30 -4:00. WILL CALL AT THAT TIME. Follow-up by: Scherrie Bateman, LPN,  October 22, 2009 1:43 PM  Additional Follow-up for Phone Call Additional follow up Details #1::        SPoke with her:  low blood pressure willd ecrease dilt 120 two times a day, d/c tikosyn, decrease kcl to 2 two times a day   will get instructions for AV ablation to her prior to trip to [place], but reviewed.  Additional Follow-up by: Nathen May, MD, [facility],  October 22, 2009 5:11 PM    New/Updated Medications: POTASSIUM CHLORIDE CRYS CR 20 MEQ CR-TABS (POTASSIUM CHLORIDE CRYS CR) 2 every am and 2 every pm CARDIZEM 120 MG TABS (DILTIAZEM HCL) 1 tab in am1 tabs in pm  Appended Document: pt wants to talk to Dr. Graciela Husbands today INSTRUCTIONS MAILED TO PT LAST WEEK./CY

## 2010-04-10 NOTE — Progress Notes (Signed)
Summary: rx refill  Phone Note Refill Request Message from:  Patient on March 13, 2010 11:24 AM  Refills Requested: Medication #1:  POTASSIUM CHLORIDE CRYS CR 20 MEQ CR-TABS 2 every am and 3 every pm  Medication #2:  FUROSEMIDE 40 MG TABS take 2  tab two times a day  Medication #3:  WARFARIN SODIUM 5 MG TABS Use as directed by Anticoagulation Clinic  Medication #4:  PLETAL 100 MG  TABS Take 1 tablet by mouth two times a day [facility] phone# [phone] pt id# [phone] pt wants a 90 day supply.    Method Requested: Telephone to Pharmacy Initial call taken by: Roe Coombs,  March 13, 2010 11:25 AM  Follow-up for Phone Call        Rx faxed to pharmacy. Vikki Ports  March 13, 2010 1:10 PM [facility] will refill Warfarin     Prescriptions: FUROSEMIDE 40 MG TABS (FUROSEMIDE) take 2  tab two times a day  #360 x 3   Entered by:   Vikki Ports   Authorized by:   Norva Karvonen, MD   Signed by:   Vikki Ports on 03/13/2010   Method used:   Faxed to ...       [facility] (mail-order)             , [place]         Ph: [phone]       Fax: [phone]   RxID:   [id number] PLETAL 100 MG  TABS (CILOSTAZOL) Take 1 tablet by mouth two times a day  #180 x 3   Entered by:   Vikki Ports   Authorized by:   Norva Karvonen, MD   Signed by:   Vikki Ports on 03/13/2010   Method used:   Faxed to ...       [facility] (mail-order)             , [place]         Ph: [phone]       Fax: [phone]   RxID:   [id number] POTASSIUM CHLORIDE CRYS CR 20 MEQ CR-TABS (POTASSIUM CHLORIDE CRYS CR) 2 every am and 3 every pm  #270 x 3   Entered by:   Vikki Ports   Authorized by:   Norva Karvonen, MD   Signed by:   Vikki Ports on 03/13/2010   Method used:   Faxed to ...       [facility] (mail-order)             , [place]         Ph: [phone]       Fax: [phone]   RxID:   [id number]   Appended Document: rx  refill    Clinical Lists Changes  Medications: Rx of WARFARIN SODIUM 5 MG TABS (WARFARIN SODIUM) Use as directed by Anticoagulation Clinic;  #135 x 1;  Signed;  Entered by: Weston Brass PharmD;  Authorized by: Norva Karvonen, MD;  Method used: Electronically to [facility] [address] [place], [place], [place]  [address], Ph: [phone], Fax: [phone]    Prescriptions: WARFARIN SODIUM 5 MG TABS (WARFARIN SODIUM) Use as directed by Anticoagulation Clinic  #135 x 1   Entered by:   Weston Brass PharmD   Authorized by:   Norva Karvonen, MD   Signed by:   Weston Brass PharmD on 03/13/2010   Method used:   Electronically to  [facility] (retail)       [address] [place], [place]  [address]       Ph: [phone]       Fax: [phone]   RxID:   [id number]

## 2010-04-10 NOTE — Medication Information (Signed)
Summary: rov/eac  Anticoagulant Therapy  Managed by: Weston Brass, PharmD Referring MD: Sherryl Manges MD PCP: Dr. Debby Bud Supervising MD: Antoine Poche MD, Fayrene Fearing Indication 1: Atrial Fibrillation (ICD-427.31) Indication 2: Cerebrovascular Accident Lab Used: [facility] Site: [facility] INR POC 2.6 INR RANGE 2.0-3.0  Dietary changes: no    Health status changes: no    Bleeding/hemorrhagic complications: no    Recent/future hospitalizations: no    Any changes in medication regimen? no    Recent/future dental: no  Any missed doses?: no       Is patient compliant with meds? yes       Allergies: No Known Drug Allergies  Anticoagulation Management History:      The patient is taking warfarin and comes in today for a routine follow up visit.  Positive risk factors for bleeding include an age of 75 years or older, history of CVA/TIA, and presence of serious comorbidities.  The bleeding index is 'high risk'.  Positive CHADS2 values include History of CHF, Age > 45 years old, History of Diabetes, and Prior Stroke/CVA/TIA.  The start date was 01/20/2004.  Her last INR was 2.1.  Anticoagulation responsible provider: Antoine Poche MD, Fayrene Fearing.  INR POC: 2.6.  Cuvette Lot#: [id number].  Exp: 04/2011.    Anticoagulation Management Assessment/Plan:      The patient's current anticoagulation dose is Warfarin sodium 5 mg tabs: Use as directed by Anticoagulation Clinic.  The target INR is 2.0-3.0.  The next INR is due 04/18/2010.  Anticoagulation instructions were given to patient.  Results were reviewed/authorized by Weston Brass, PharmD.  She was notified by Linward Headland, PharmD candidate.         Prior Anticoagulation Instructions: INR 3.6  Do NOT take coumadin today.  Then return to normal dosing schedule of 1 tablet on Saturday and 1.5 tablets all other days.  Return to clinic in 2 weeks.    Current Anticoagulation Instructions: INR 2.6 (goal INR: 2-3)  Take 1 and 1/2 tablets except 1 tablet on  Saturdays.  Recheck in 4 weeks.

## 2010-04-10 NOTE — Progress Notes (Signed)
Summary: mail order-Rfs  Phone Note Call from Patient   [name]: Patient Summary of Call: pt left vm stating she has changed pharm to [facility] enhanced PDP # [phone].  ID # [id number]. I called this number to authorize Glipizide XL and Lipitor. Spoke to a rep there and she gave me a diff number [phone]. I spoke Judeth Cornfield at [facility] and called in these meds for pt. Initial call taken by: Lanier Prude, [facility]),  March 13, 2010 3:19 PM  Follow-up for Phone Call        pt informed Rxs called in. Follow-up by: Lanier Prude, [facility]),  March 13, 2010 3:24 PM    Prescriptions: GLIPIZIDE 5 MG TABS (GLIPIZIDE) 1 1/2 once daily  #135 x 2   Entered by:   Lanier Prude, [facility])   Authorized by:   Jacques Navy MD   Signed by:   Lanier Prude, CMA(AAMA) on 03/13/2010   Method used:   Telephoned to ...       [facility] (mail-order)             , [place]         Ph: [phone]       Fax: [phone]   RxID:   [id number] LIPITOR 20 MG  TABS (ATORVASTATIN CALCIUM) once daily  #90 x 2   Entered by:   Lanier Prude, [facility])   Authorized by:   Jacques Navy MD   Signed by:   Lanier Prude, CMA(AAMA) on 03/13/2010   Method used:   Telephoned to ...       [facility] (mail-order)             , [place]         Ph: [phone]       Fax: [phone]   RxID:   [id number]

## 2010-04-10 NOTE — Letter (Signed)
Summary: CMN for Oxygen/[facility]  CMN for Oxygen/[facility]   Imported By: Lanelle Bal 06/21/2009 11:00:08  _____________________________________________________________________  External Attachment:    Type:   Image     Comment:   External Document

## 2010-04-10 NOTE — Progress Notes (Signed)
Summary: Cough- request abx  Phone Note Call from Patient Call back at Home Phone [phone]   [name]: Patient Call For: WRIGHT Summary of Call: PT WAS SEEN 8/19. TODAY C/O COUGH X 3 DAYS. SAYS PHLEGM IS GREEN TODAY. NO FEVER, N OR V. SAYS SHE USED DRINKING WATER INSTEAD OF DISTILLED WATER (BY ACCIDENT) W/ CPAP;  USED A LOT OF IT UNTIL HER HUSBAND BROUGHT THIS TO HER ATTN. REQUESTS RX SO THAT SHE DOESN'T GET ANY MORE HEART PROBLEMS OR SHE WILL COME IN IF NEEDED. HOWEVER, SHE IS LEAVING AT 2:45 TODAY TO HAVE HER DOG PUT TO SLEEP SO IS REQUESTING A CALL BACK FROM NURSE ASAP BEFORE THEN.  Initial call taken by: Tivis Ringer, CNA,  November 19, 2009 1:07 PM  Follow-up for Phone Call        Spoke with pt.  She c/o prod cough x 3 days- sputum is green.  She states that her breathing is the same- "maybe a little more difficult".  She states that she accidentally used non distilled water in her CPAP.  She wonders if she needs an abx.  Last seen on 10/25/09.  Pls advise thanks! Follow-up by: Vernie Murders,  November 19, 2009 1:11 PM  Additional Follow-up for Phone Call Additional follow up Details #1::        Rx avelox 400mg  /d x 5days prednisone 10mg  Take 4 daily for two days, then 3 daily for two days, then two daily for two days then one daily for two days then stop  Ov if unimproved  Additional Follow-up by: Storm Frisk MD,  November 19, 2009 2:34 PM    Additional Follow-up for Phone Call Additional follow up Details #2::    Spoke with pt and notified of recs per PW.  Pt verbalized understanding and states that she will call for ov if not getting better.  Rxs were sent to pharm. Follow-up by: Vernie Murders,  November 19, 2009 2:42 PM  New/Updated Medications: AVELOX 400 MG TABS (MOXIFLOXACIN HCL) 1 by mouth once daily until gone PREDNISONE 10 MG TABS (PREDNISONE) 4 each am x 2 days, 3 each am x 2 days, 2 each am x 2 days, 1 each am x 2 days then  stop Prescriptions: PREDNISONE 10 MG TABS (PREDNISONE) 4 each am x 2 days, 3 each am x 2 days, 2 each am x 2 days, 1 each am x 2 days then stop  #20 x 0   Entered by:   Vernie Murders   Authorized by:   Storm Frisk MD   Signed by:   Vernie Murders on 11/19/2009   Method used:   [facility] [phone]* (retail)       [address]       [place], [place]  [address]       Ph: [phone] or [phone]       Fax: [phone]   RxID:   [id number] AVELOX 400 MG TABS (MOXIFLOXACIN HCL) 1 by mouth once daily until gone  #5 x 0   Entered by:   Vernie Murders   Authorized by:   Storm Frisk MD   Signed by:   Vernie Murders on 11/19/2009   Method used:   [facility]        [facility] [facility] [address] [phone]* (retail)       [address]  [place], [place]  [address]       Ph: [phone] or [phone]       Fax: [phone]   RxID:   [id number]

## 2010-04-10 NOTE — Miscellaneous (Signed)
Summary: update med  Clinical Lists Changes  Medications: Changed medication from PLETAL 100 MG  TABS (CILOSTAZOL) Take 1 tablet by mouth once a day to PLETAL 100 MG  TABS (CILOSTAZOL) Take 1 tablet by mouth two times a day

## 2010-04-10 NOTE — Progress Notes (Signed)
  Phone Note Call from Patient   Reason for Call: Talk to Doctor Action Taken: Phone Call Completed Details for Reason: Medication not taken Summary of Call: [name] states that on 07/09/2009 she forgot to take her am dose of tikosyn, and took it instead at 12noon, and then again at 1 am 07/10/2009.  She states that today that she has forgotten to take it again. Inquires about what to do.    I have advised her to take her dose of Tikosyn now, and resume taking it two times a day as directed in the am.  She is counselled on need to remember dosing to avoid rehospitalization. Initial call taken by: Joni Reining NP

## 2010-04-10 NOTE — Progress Notes (Signed)
Summary: BP  Phone Note Call from Patient Call back at Home Phone [phone]   [name]: Patient Reason for Call: Talk to Nurse Summary of Call: BP 94/65 pulse 128, is this ok... going to [facility] on Friday Initial call taken by: Migdalia Dk,  August 19, 2009 3:11 PM  Follow-up for Phone Call        spoke with pt she is asymptomatic.  The only reason she knows this is going on is because she was given a BP cuff by a friend and checked it today.  she feels fine.   Dennis Bast, RN, BSN  August 19, 2009 4:45 PM

## 2010-04-10 NOTE — Progress Notes (Signed)
Summary: rx req- pt out of town w/ out Media planner Note Call from Patient   [name]: Daughter Call For: [employer] of Call: pt has arrived at daughter's home in [place], [place]. didn't bring her spiriva handihaler. daughter wants to know if it's ok for her to be without it? and if this needs to be called in please do so asap today- [facility] [address], [place] [phone]. daughter michell giuliano [phone]- daughter requests to speak to nurse re: this Initial call taken by: Tivis Ringer, CNA,  August 07, 2009 4:34 PM  Follow-up for Phone Call        Spoke with pt's daughter.  She states that pt is out of town and ran out of spiriva.  She has missed yesterday and today and will be back in town tommorrow to pick up refill.  She states that if it is absolutely neccessary we can call in rx for tonite and tommorrow am.  She does not want pt to have to pay the copay unless really needed.  She states that pt's breathing is fine.  Please advise, thanks! Follow-up by: Vernie Murders,  August 07, 2009 4:53 PM  Additional Follow-up for Phone Call Additional follow up Details #1::        ok to skip until back in town Additional Follow-up by: Storm Frisk MD,  August 07, 2009 5:07 PM    Additional Follow-up for Phone Call Additional follow up Details #2::    Spoke with pt's daughter Joyce Gross and advised that per PW, okay to skip the spiriva until she gets back to town.   Follow-up by: Vernie Murders,  August 07, 2009 5:17 PM

## 2010-04-10 NOTE — Medication Information (Signed)
Summary: ccr/jss  Anticoagulant Therapy  Managed by: Weston Brass, PharmD Referring MD: Sherryl Manges MD PCP: Dr [name] . Polite Supervising MD: Daleen Squibb MD, Maisie Fus Indication 1: Atrial Fibrillation (ICD-427.31) Lab Used: [facility] Site: [facility] INR POC 1.8 INR RANGE 2.0-3.0  Dietary changes: yes       Details: went out of town so diet was different   Health status changes: no    Bleeding/hemorrhagic complications: no    Recent/future hospitalizations: no    Any changes in medication regimen? no    Recent/future dental: no  Any missed doses?: yes     Details: may have missed a dose but unsure   Is patient compliant with meds? yes       Allergies: No Known Drug Allergies  Anticoagulation Management History:      The patient is taking warfarin and comes in today for a routine follow up visit.  Positive risk factors for bleeding include an age of 75 years or older, history of CVA/TIA, and presence of serious comorbidities.  The bleeding index is 'high risk'.  Positive CHADS2 values include History of CHF, Age > 43 years old, History of Diabetes, and Prior Stroke/CVA/TIA.  The start date was 01/20/2004.  Her last INR was 2.2 RATIO.  Anticoagulation responsible provider: Daleen Squibb MD, Maisie Fus.  INR POC: 1.8.  Cuvette Lot#: [id number].  Exp: 10/2010.    Anticoagulation Management Assessment/Plan:      The patient's current anticoagulation dose is Warfarin sodium 5 mg tabs: Use as directed by Anticoagulation Clinic.  The target INR is 2.0-3.0.  The next INR is due 09/03/2009.  Anticoagulation instructions were given to patient.  Results were reviewed/authorized by Weston Brass, PharmD.  She was notified by Weston Brass PharmD.         Prior Anticoagulation Instructions: INR 2.4  Continue taking 1.5 tablets everyday except take 1 tablet on Saturdays.  Recheck in 4 weeks.  Current Anticoagulation Instructions: INR 1.8  Take 2 tablets today and tomorrow then resume same dose of 1 1/2 tablets  every day except 1 tablet on Saturday

## 2010-04-10 NOTE — Miscellaneous (Signed)
Summary: Orders Update  Clinical Lists Changes  Orders: Added new Test order of Carotid Duplex (Carotid Duplex) - Signed 

## 2010-04-10 NOTE — Letter (Addendum)
Summary: [facility]  [facility]   Imported By: Sherian Rein 03/20/2010 11:04:57  _____________________________________________________________________  External Attachment:    Type:   Image     Comment:   External Document

## 2010-04-10 NOTE — Assessment & Plan Note (Signed)
Summary: NEW/ MEDICARE/ BCBS /[place]WS  #   Vital Signs:  Patient profile:   77 year old female Height:      65.5 inches Weight:      185 pounds BMI:     30.43 O2 Sat:      95 % on Room air Temp:     97.4 degrees F oral Pulse rate:   70 / minute BP sitting:   102 / 60  (left arm) Cuff size:   regular  Vitals Entered By: Bill Salinas CMA (February 21, 2010 2:40 PM)  O2 Flow:  Room air CC: New medicare/ab   Primary Care Provider:  Dr [name] . Polite  CC:  New medicare/ab.  History of Present Illness: Patinet presents to establish with a primary care doctor.   She has multiple medical problems including breast cancer, ductal carcinoma, and is being followed closely by Dr. Darnelle Catalan. She is taking hormonal treatment-letrozole. She has a lot of information from several sources. She will be undergoing a DXA scan in about a month.   She is feeling OK despite her multple medical problems. She remains very active. No evidence of depression in this interview. She is independent in her ADLs. She seems to be intellectually very sharp - well informed about her diseases states and knowledgable about the medical care system.  Preventive Screening-Counseling & Management  Alcohol-Tobacco     Alcohol drinks/day: <1     Alcohol type: wine     >5/day in last 3 mos: yes     Alcohol Counseling: not indicated; use of alcohol is not excessive or problematic     Smoking Status: current     Smoking Cessation Counseling: yes     Smoke Cessation Stage: ready     Packs/Day: 1.0     Year Started: 1950s  Caffeine-Diet-Exercise     Caffeine use/day: 1-4 cups per day     Diet Comments: heart healthy     Diet Counseling: not indicated; diet is assessed to be healthy     Does Patient Exercise: no     Exercise Counseling: to improve exercise regimen  Hep-HIV-STD-Contraception     Hepatitis Risk: no risk noted     HIV Risk: no risk noted     STD Risk: no risk noted     Dental Visit-last 6 months no  Sun Exposure-Excessive: no  Safety-Violence-Falls     Seat Belt Use: yes     Helmet Use: n/a     Firearms in the Home: firearms in the home     Smoke Detectors: yes     Violence in the Home: no risk noted     Sexual Abuse: no     Fall Risk: slight fall risk      Sexual History:  currently monogamous.        Drug Use:  never.        Blood Transfusions:  no.    Current Medications (verified): 1)  Potassium Chloride Crys Cr 20 Meq Cr-Tabs (Potassium Chloride Crys Cr) .... 2 Every Am and 3 Every Pm 2)  Advair Diskus 250-50 Mcg/dose  Misc (Fluticasone-Salmeterol) .... One Puff Two Times A Day 3)  Spiriva Handihaler 18 Mcg  Caps (Tiotropium Bromide Monohydrate) .... One Capsule Once Daily 4)  Pletal 100 Mg  Tabs (Cilostazol) .... Take 1 Tablet By Mouth Two Times A Day 5)  Nexium 40 Mg  Cpdr (Esomeprazole Magnesium) .... Take One Half Hour Before Eating. Take One Tablet By Mouth Every  Other Day 6)  Mucinex 600 Mg  Xr12h-Tab (Guaifenesin) .... One By Mouth Two Times A Day 7)  Lipitor 20 Mg  Tabs (Atorvastatin Calcium) .... Once Daily 8)  Glucosamine 1500 Complex   Caps (Glucosamine-Chondroit-Vit C-Mn) .... Two Times A Day 9)  Fe-Caps 250 Mg Cr-Caps (Ferrous Sulfate) .... One Daily 10)  Xopenex Hfa 45 Mcg/act Aero (Levalbuterol Tartrate) .... 2 Puffs Every 6 Hours As Needed 11)  Furosemide 40 Mg Tabs (Furosemide) .... Take 2  Tab Two Times A Day 12)  Bayer Low Strength 81 Mg Tbec (Aspirin) .Marland Kitchen.. 1 By Mouth Daily 13)  Magnesium 250 Mg Tabs (Magnesium) .... Take 1 Tablet By Mouth Once A Day 14)  Glipizide 5 Mg Tabs (Glipizide) .Marland Kitchen.. 1 1/2 Once Daily 15)  Fish Oil 1000 Mg Caps (Omega-3 Fatty Acids) .... Take 1 Capsule By Mouth Two Times A Day 16)  Warfarin Sodium 5 Mg Tabs (Warfarin Sodium) .... Use As Directed By Anticoagulation Clinic 17)  Enoxaparin Sodium 120 Mg/0.70ml Soln (Enoxaparin Sodium) .... Inject 1 Syringe Subcutaneously Once Daily As Directed  Allergies (verified): No Known Drug  Allergies  Past History:  Past Medical History: HYPERSOMNIA, ASSOCIATED WITH SLEEP APNEA (ICD-780.53) Amiodarone Lung Toxicity RENAL DISEASE, CHRONIC  Creatinine 1.11 January 2009 CVA (ICD-434.91) DM (ICD-250.00) cigarette abuse  DIASTOLIC HEART FAILURE, ACUTE ON CHRONIC (ICD-428.33) PACEMAKER, PERMANENT   ATRIAL FIBRILLATION (ICD-427.31)       -AV ablation 9/09 [facility] per Dr Sampson Goon       - AV node ablation 09/11 Dr. Joseph Berkshire (ICD-272.4) GERD (ICD-530.81) CORONARY ARTERY DISEASE not sure of htis 11/10 COPD (ICD-496)-emphysema     -FeV1 73%  DLCO 53% 5/09 PAD with hx right iliac/SFA stenting and left leg PTA Invasive ductal carcimoma-left breast-2011 OA right knee Sciatica - right  Physician Roster:                     EP/card - Dr. Graciela Husbands                     GS-Dr. Abbey Chatters                     ONC- Dr. Elpidio Galea- Dr. Ubaldo Glassing - Dr. Dorinda Hill                    opthal - Dr. Dione Booze                    GI   - Dr. Randa Evens                    ortho - Dr. Bobbye Riggs - Dr. Delford Field  Past Surgical History: Pulmonary vein isolation x2 once at [facility] and once at [facility] Left Breast Partial mastectomy  Nov 28, '11 (Dr. Abbey Chatters) Cataract removal with IOL bilateral ( Dr. Dione Booze)  Family History: Mother deceased @ 83died of unknown cause.  Possible EtOH abuse Father died @ 41  with heart disease.   Neg- breast cancer, colon cancer; DM The patient has two sisters who are alive and well.  No brothers.  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 Retired [employer] - garden, active lifestyle Marriage a bit stressful - SO with  memory problems and difficult behavior. She denies any personal safety concerns. End - of - life Care: will need to address at next OV (Dec 16.'11)    Tobacco Use - Yes.  Alcohol Use - yes Patient is a current smoker.  Packs/Day:  1.0 Caffeine use/day:  1-4 cups per day Does Patient Exercise:  no Dental Care w/in 6 mos.:  no Sun Exposure-Excessive:  no Seat Belt Use:  yes Fall Risk:  slight fall risk Blood Transfusions:  no Drug Use:  never Hepatitis Risk:  no risk noted HIV Risk:  no risk noted STD Risk:  no risk noted Sexual History:  currently monogamous  Review of Systems  The patient denies anorexia, fever, weight loss, vision loss, decreased hearing, chest pain, prolonged cough, abdominal pain, severe indigestion/heartburn, muscle weakness, difficulty walking, depression, and enlarged lymph nodes.    Physical Exam  General:  alert, well-developed, and well-nourished white woman in no distress.   Head:  normocephalic and atraumatic.   Eyes:  pupils equal and pupils round.  C&S clear Neck:  supple, full ROM, and no thyromegaly.   Chest Wall:  no deformities.   Breasts:  deferred Lungs:  normal respiratory effort, no intercostal retractions, no accessory muscle use, normal breath sounds, and no wheezes.   Heart:  normal rate, regular rhythm, and no murmur.   Msk:  no joint tenderness, no joint swelling, and no redness over joints.   Pulses:  2+ radial Extremities:  No clubbing, cyanosis, edema, or deformity noted with normal full range of motion of all joints.   Neurologic:  alert & oriented X3, cranial nerves II-XII intact, and gait normal.   Skin:  turgor normal and color normal.   Cervical Nodes:  no anterior cervical adenopathy and no posterior cervical adenopathy.   Psych:  Oriented X3, memory intact for recent and remote, normally interactive, good eye contact, and not anxious appearing.     Impression & Recommendations:  Problem # 1:  INVASIVE DUCTAL CARCINOMA,  LEFT BREAST (ICD-174.9) Patient with partial mastectomy Nov 28th. She is followed by Dr. Darnelle Catalan and is on hormonal therapy. She did have a recent CT chest with multiple nodules: differential includes possible metastatic disease, inflammatory changes. Of note she has a history of pulmonary side effects of amiodarone.  Plan - defer to Dr. Darnelle Catalan  Problem # 2:  PRIMARY LOCALIZED OSTEOARTHROSIS LOWER LEG (ICD-715.16) Patient followed by Dr. Charlett Blake and she has had several cortisone injections to the knee. Some of her leg pain may also be due to lumbar radiculopathy with known DDD L4-5 with left radicular symptoms in the past. Date of last imaging study of lumbar spine not available.  Plan - per Dr. Charlett Blake.           continue with exercise program: tai chi, water aerobics.  Her updated medication list for this problem includes:    Bayer Low Strength 81 Mg Tbec (Aspirin) .Marland Kitchen... 1 by mouth daily  Problem # 3:  ATRIAL FIBRILLATION (ICD-427.31) Status post ablation procedure Sept '11  by Dr. Graciela Husbands.  Her updated medication list for this problem includes:  Pletal 100 Mg Tabs (Cilostazol) .Marland Kitchen... Take 1 tablet by mouth two times a day    Bayer Low Strength 81 Mg Tbec (Aspirin) .Marland Kitchen... 1 by mouth daily    Warfarin Sodium 5 Mg Tabs (Warfarin sodium) ..... Use as directed by anticoagulation clinic  Problem # 4:  DIASTOLIC HEART FAILURE, ACUTE ON CHRONIC (ICD-428.33) Patient doing well. No evidence of decompensation on today's exam.  Plan - continue present medical regimen.  Her updated medication list for this problem includes:    Pletal 100 Mg Tabs (Cilostazol) .Marland Kitchen... Take 1 tablet by mouth two times a day    Furosemide 40 Mg Tabs (Furosemide) .Marland Kitchen... Take 2  tab two times a day    Bayer Low Strength 81 Mg Tbec (Aspirin) .Marland Kitchen... 1 by mouth daily    Warfarin Sodium 5 Mg Tabs (Warfarin sodium) ..... Use as directed by anticoagulation clinic  Problem # 5:  NICOTINE ADDICTION (ICD-305.1) In a setting of  diagnosed vascular disease both peripheral and coronary, diagnosed emphysema she continues to smoke - the very definition of addiction.  Plan - will work with the patient on smoking cessation at future visits.  Problem # 6:  DM (ICD-250.00) Reviewed eChart - last available A1C Apr 30, 2008 6.3%.  Plan - continue present medications.           repeat A1C at appropriate interval ( 3 months from last lab)  Her updated medication list for this problem includes:    Bayer Low Strength 81 Mg Tbec (Aspirin) .Marland Kitchen... 1 by mouth daily    Glipizide 5 Mg Tabs (Glipizide) .Marland Kitchen... 1 1/2 once daily  Labs Reviewed: Creat: 1.1 (11/04/2009)     Problem # 7:  HYPERLIPIDEMIA (ICD-272.4) Reviewed eChart - last available lip;id panel Apr 30, 2008 with LDL 66, HDL 48  Plan - continue present medications           repeat lab when due.  Her updated medication list for this problem includes:    Lipitor 20 Mg Tabs (Atorvastatin calcium) ..... Once daily  Problem # 8:  [name] (ICD-443.9) Complex hx of percutaneous interventions. She is not complaining of claudication at this visit. Last carotid doppler OK. She does continue to smoke, but lipids are controlled.  Plan - continue present medications           SMOKING CESSATION.  Problem # 9:  COPD (ICD-496)  Followed by Dr. Delford Field. Spirometry today with FVC 56% predicted, FEV1 59% predicted with a lung age calculated to be 43 yrs in a 77 year old. Previous sutdies with low diffusion capacity. No SOB at rest or with walking noted today.  Plan - continue present medication           SMOKING CESSATION.  Her updated medication list for this problem includes:    Advair Diskus 250-50 Mcg/dose Misc (Fluticasone-salmeterol) ..... One puff two times a day    Spiriva Handihaler 18 Mcg Caps (Tiotropium bromide monohydrate) ..... One capsule once daily    Xopenex Hfa 45 Mcg/act Aero (Levalbuterol tartrate) .Marland Kitchen... 2 puffs every 6 hours as needed  Orders: Spirometry w/Graph  (94010)  Problem # 10:  Preventive Health Care (ICD-V70.0)  Complex medical problems with active treatment for invasive ductal carcinoma at this time. Fortunately her othermedical problems do seem stable at this visit. Limited physical exam is unremarkable. 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 She has not fallen although there is increased risk with her knee  problems and back disease. She is currently not using a device, i.e. platform cane or walker. Her spirits are good by appearances. Will watch closely for signs of anxiety or reactive depression. She does have a good family support network (son and daughter-in-law in [place]). She has a good grasp of her complex medical conditions although she has not been able to address her nicotine addiction. She isw cognitively intact. Immunizations are up to date. Will need to determine her status re: colorectal cancer screening.  In summary - a very intersting woman who is in pretty good condition for the condition she is in. She will return in 2-3 weeks for follow-up.  Orders: Medicare -1st Annual Wellness Visit [phone])  Complete Medication List: 1)  Potassium Chloride Crys Cr 20 Meq Cr-tabs (Potassium chloride crys cr) .... 2 every am and 3 every pm 2)  Advair Diskus 250-50 Mcg/dose Misc (Fluticasone-salmeterol) .... One puff two times a day 3)  Spiriva Handihaler 18 Mcg Caps (Tiotropium bromide monohydrate) .... One capsule once daily 4)  Pletal 100 Mg Tabs (Cilostazol) .... Take 1 tablet by mouth two times a day 5)  Nexium 40 Mg Cpdr (Esomeprazole magnesium) .... Take one half hour before eating. take one tablet by mouth every other day 6)  Mucinex 600 Mg Xr12h-tab (Guaifenesin) .... One by mouth two times a day 7)  Lipitor 20 Mg Tabs (Atorvastatin calcium) .... Once daily 8)  Glucosamine 1500 Complex Caps (Glucosamine-chondroit-vit c-mn) .... Two times a day 9)  Fe-caps 250 Mg Cr-caps (Ferrous sulfate)  .... One daily 10)  Xopenex Hfa 45 Mcg/act Aero (Levalbuterol tartrate) .... 2 puffs every 6 hours as needed 11)  Furosemide 40 Mg Tabs (Furosemide) .... Take 2  tab two times a day 12)  Bayer Low Strength 81 Mg Tbec (Aspirin) .Marland Kitchen.. 1 by mouth daily 13)  Magnesium 250 Mg Tabs (Magnesium) .... Take 1 tablet by mouth once a day 14)  Glipizide 5 Mg Tabs (Glipizide) .Marland Kitchen.. 1 1/2 once daily 15)  Fish Oil 1000 Mg Caps (Omega-3 fatty acids) .... Take 1 capsule by mouth two times a day 16)  Warfarin Sodium 5 Mg Tabs (Warfarin sodium) .... Use as directed by anticoagulation clinic 17)  Enoxaparin Sodium 120 Mg/0.73ml Soln (Enoxaparin sodium) .... Inject 1 syringe subcutaneously once daily as directed   Orders Added: 1)  Medicare -1st Annual Wellness Visit [G0438] 2)  New Patient Level V [99205] 3)  Spirometry w/Graph [[id number]]

## 2010-04-10 NOTE — Letter (Signed)
Summary: ELectrophysiology/Ablation Procedure Instructions  [facility], Main Office  [address]   [place], [place] [address]   Phone: [phone]  Fax: [phone]     Electrophysiology/Ablation Procedure Instructions    You are scheduled for a(n) AV NODE ABLATION on November 06, 2009 at 8: 30 am with Dr. Graciela Husbands.  1.  Please come to the Short Stay Center at [facility] at 6:30 AM on the day of your procedure.  2.  Come prepared to stay overnight.   Please bring your insurance cards and a list of your medications.  3.  Come to the [place] office on November 04, 2009 for lab work.  The lab at [facility] is open from 8:30 AM to 1:30 PM and 2:30 PM to 5:00 PM.  The lab at [facility] is open from 7:30 AM to 5:30 PM.  You do not have to be fasting.  4.  Do not have anything to eat or drink after midnight the night before your procedure.  5.  Do NOT take your diabetes medications morning of procedure.  All of your remaining medications may be taken with a small amount of water.    * Occasionally, EP studies and ablations can become lengthy.  Please make your family aware of this before your procedure starts.  Average time ranges from 2-8 hours for EP studies/ablations.  Your physician will locate your family after the procedure with the results.  * If you have any questions after you get home, please call the office at [phone].

## 2010-04-10 NOTE — Medication Information (Signed)
Summary: rov/ewj  Anticoagulant Therapy  Managed by: Bethena Midget, RN, BSN Referring MD: Sherryl Manges MD PCP: Dr [name] . Polite Supervising MD: Shirlee Latch MD, [place] Indication 1: Atrial Fibrillation (ICD-427.31) Lab Used: [facility] Site: [facility] INR POC 1.7 INR RANGE 2.0-3.0  Dietary changes: no    Health status changes: no    Bleeding/hemorrhagic complications: no    Recent/future hospitalizations: no    Any changes in medication regimen? no    Recent/future dental: no  Any missed doses?: no       Is patient compliant with meds? yes       Allergies: No Known Drug Allergies  Anticoagulation Management History:      The patient is taking warfarin and comes in today for a routine follow up visit.  Positive risk factors for bleeding include an age of 48 years or older, history of CVA/TIA, and presence of serious comorbidities.  The bleeding index is 'high risk'.  Positive CHADS2 values include History of CHF, Age > 69 years old, History of Diabetes, and Prior Stroke/CVA/TIA.  The start date was 01/20/2004.  Her last INR was 2.2 RATIO.  Anticoagulation responsible provider: Shirlee Latch MD, [place].  INR POC: 1.7.  Cuvette Lot#: [id number].  Exp: 07/2010.    Anticoagulation Management Assessment/Plan:      The patient's current anticoagulation dose is Warfarin sodium 5 mg tabs: Use as directed by Anticoagulation Clinic.  The target INR is 2.0-3.0.  The next INR is due 06/10/2009.  Anticoagulation instructions were given to patient.  Results were reviewed/authorized by Bethena Midget, RN, BSN.  She was notified by Bethena Midget, RN, BSN.         Prior Anticoagulation Instructions: INR 1.9  TAKE 2 TABLETS TONIGHT THEN RESUME REGULAR SCHEDULE OF 1.5 TABLETS EVERYDAY EXCEPT 1 TABLET ON TUESDAYS, THURSDAYS, SATURDAYS.  RECHECK IN 3 WEEKS  Current Anticoagulation Instructions: INR 1.7 Today take 10mg s then change dose to 7.5mg s daily except 5mg s on Tuesdays and Saturdays. Recheck in 2  weeks.

## 2010-04-10 NOTE — Letter (Signed)
Summary: [facility] Discharge Summary  [facility] Discharge Summary   Imported By: Kassie Mends 04/26/2009 08:11:01  _____________________________________________________________________  External Attachment:    Type:   Image     Comment:   External Document

## 2010-04-10 NOTE — Consult Note (Signed)
Summary: [facility]  [facility]   Imported By: Marylou Mccoy 10/03/2009 08:47:09  _____________________________________________________________________  External Attachment:    Type:   Image     Comment:   External Document

## 2010-04-10 NOTE — Progress Notes (Signed)
  carotid 09-19-09.  Pt aware. ---- Converted from flag ---- ---- 09/05/2009 11:26 PM, Norva Karvonen, MD wrote: I would order a carotid duplex. They check BP in both arms, subclavian velocities, and if significant subclavian stenosis she will have retrograde vertebral flow. Lauren - will you order this? thx  ---- 09/05/2009 9:39 PM, Nathen May, MD, [facility] wrote: Kathlene November FT bp was 7 today,  do you think she could have subclavian stenosis and if so what would be the best way to  investigate this sthnaks steve ------------------------------

## 2010-04-10 NOTE — Procedures (Signed)
Summary: s/p avn ablation.amber   Current Medications (verified): 1)  Potassium Chloride Crys Cr 20 Meq Cr-Tabs (Potassium Chloride Crys Cr) .... 2 Every Am and 3 Every Pm 2)  Advair Diskus 250-50 Mcg/dose  Misc (Fluticasone-Salmeterol) .... One Puff Two Times A Day 3)  Spiriva Handihaler 18 Mcg  Caps (Tiotropium Bromide Monohydrate) .... One Capsule Once Daily 4)  Fluticasone Propionate 50 Mcg/act Susp (Fluticasone Propionate) .... 2 Sprays Each Nostril At Bedtime 5)  Pletal 100 Mg  Tabs (Cilostazol) .... Take 1 Tablet By Mouth Once A Day 6)  Nexium 40 Mg  Cpdr (Esomeprazole Magnesium) .... Take One Half Hour Before Eating. Take One Tablet By Mouth Every Other Day 7)  Mucinex 600 Mg  Xr12h-Tab (Guaifenesin) .... One By Mouth Two Times A Day 8)  Lipitor 20 Mg  Tabs (Atorvastatin Calcium) .... Once Daily 9)  Glucosamine 1500 Complex   Caps (Glucosamine-Chondroit-Vit C-Mn) .... Two Times A Day 10)  Fe-Caps 250 Mg Cr-Caps (Ferrous Sulfate) .... One Daily 11)  Xopenex Hfa 45 Mcg/act Aero (Levalbuterol Tartrate) .... 2 Puffs Every 6 Hours As Needed 12)  Furosemide 40 Mg Tabs (Furosemide) .... Take 2  Tab Two Times A Day 13)  Bayer Low Strength 81 Mg Tbec (Aspirin) .Marland Kitchen.. 1 By Mouth Daily 14)  Magnesium .Marland Kitchen.. 1 By Mouth Daily 15)  Glipizide 5 Mg Tabs (Glipizide) .Marland Kitchen.. 1 1/2 Once Daily 16)  Fish Oil 1000 Mg Caps (Omega-3 Fatty Acids) .... Take 1 Capsule By Mouth Two Times A Day 17)  Warfarin Sodium 5 Mg Tabs (Warfarin Sodium) .... Use As Directed By Anticoagulation Clinic  Allergies (verified): No Known Drug Allergies   PPM Specifications Following MD:  Sherryl Manges, MD     PPM Vendor:  [employer] Jude     PPM Model Number:  [id number]     PPM Serial Number:  [id number] PPM DOI:  01/23/2004     PPM Implanting MD:  Sherryl Manges, MD  Lead 1    Location: RA     DOI: 01/23/2004     Model #: 1642     Serial #: [id number]     Status: active Lead 2    Location: RV     DOI: 01/23/2004     Model #: 1646T     Serial #:  [id number]     Status: active  Magnet Response Rate:  BOL 98.6  Indications:  TACHY BRADY SYNDROME   PPM Follow Up Battery Voltage:  2.76 V     Battery Est. Longevity:  4.75-5.75 YRS     Pacer Dependent:  No     Right Ventricle  Amplitude: PACED mV, Impedance: 469 ohms, Threshold: 0.625 V at 0.5 msec  Episodes MS Episodes:  0     Coumadin:  Yes Ventricular High Rate:  0     Ventricular Pacing:  >99%  Parameters Mode:  VVI     Lower Rate Limit:  80     Upper Rate Limit:  90 Paced AV Delay:  275     Sensed AV Delay:  250 Next Cardiology Appt Due:  12/03/2009 Tech Comments:  PT HAD ABLATION--CHANGED LRL FROM 90 TO 80bpm.  TURNED RV AUTOCAPTURE ON TO INCREASE BATTERY LONGEVITY TO 4.75-5.75 YRS.  ROV IN 2 WKS TO DECREASE LRL TO 70bpm. Vella Kohler  November 19, 2009 2:53 PM

## 2010-04-10 NOTE — Medication Information (Signed)
Summary: rov/tm  Anticoagulant Therapy  Managed by: Louie Casa, PharmD Referring MD: Sherryl Manges MD PCP: Dr [name] . Polite Supervising MD: Shirlee Latch MD, Davyn Morandi Indication 1: Atrial Fibrillation (ICD-427.31) Lab Used: [facility] Site: [facility] INR POC 1.9 INR RANGE 2.0-3.0  Dietary changes: no    Health status changes: no    Bleeding/hemorrhagic complications: no    Recent/future hospitalizations: no    Any changes in medication regimen? no    Recent/future dental: no  Any missed doses?: no       Is patient compliant with meds? yes       Current Medications (verified): 1)  Klor-Con 20 Meq  Pack (Potassium Chloride) .... Take 1 Tablet in The Morning and 2at Night 2)  Advair Diskus 250-50 Mcg/dose  Misc (Fluticasone-Salmeterol) .... One Puff Two Times A Day 3)  Spiriva Handihaler 18 Mcg  Caps (Tiotropium Bromide Monohydrate) .... One Capsule Once Daily 4)  Nasonex 50 Mcg/act  Susp (Mometasone Furoate) .... Two Puff Ea Nostril Once Daily As Needed 5)  Pletal 100 Mg  Tabs (Cilostazol) .... Take 1 Tablet By Mouth Once A Day 6)  Nexium 40 Mg  Cpdr (Esomeprazole Magnesium) .... By Mouth Daily. Take One Half Hour Before Eating. 7)  Mucinex 600 Mg  Xr12h-Tab (Guaifenesin) .... One By Mouth Two Times A Day 8)  Lipitor 20 Mg  Tabs (Atorvastatin Calcium) .... Once Daily 9)  Glucosamine 1500 Complex   Caps (Glucosamine-Chondroit-Vit C-Mn) .... Two Times A Day 10)  Cardizem 120 Mg Tabs (Diltiazem Hcl) .Marland Kitchen.. 1 Tab in Am 2 Tabs in Pm 11)  Fe-Caps 250 Mg Cr-Caps (Ferrous Sulfate) .... One Daily 12)  Xopenex Hfa 45 Mcg/act Aero (Levalbuterol Tartrate) .... 2 Puffs Every 6 Hours As Needed 13)  Furosemide 40 Mg Tabs (Furosemide) .... Take 2  Tab Two Times A Day 14)  Spironolactone 25 Mg Tabs (Spironolactone) .... Take One Tablet Once Daily 15)  Bayer Low Strength 81 Mg Tbec (Aspirin) .Marland Kitchen.. 1 By Mouth Daily 16)  Magnesium .Marland Kitchen.. 1 By Mouth Daiky 17)  Oxygen .... 2l At Bedtime 18)   Glipizide 5 Mg Tabs (Glipizide) .Marland Kitchen.. 1 1/2 Once Daily 19)  Fish Oil 1000 Mg Caps (Omega-3 Fatty Acids) .... Take 1 Capsule By Mouth Two Times A Day 20)  Warfarin Sodium 5 Mg Tabs (Warfarin Sodium) .... Use As Directed By Anticoagulation Clinic 21)  Tikosyn 250 Mcg Caps (Dofetilide) .... Take One Capsule Every 12 Hours  Allergies (verified): No Known Drug Allergies  Anticoagulation Management History:      The patient is taking warfarin and comes in today for a routine follow up visit.  Positive risk factors for bleeding include an age of 28 years or older, history of CVA/TIA, and presence of serious comorbidities.  The bleeding index is 'high risk'.  Positive CHADS2 values include History of CHF, Age > 47 years old, History of Diabetes, and Prior Stroke/CVA/TIA.  The start date was 01/20/2004.  Her last INR was 2.2 RATIO.  Anticoagulation responsible provider: Shirlee Latch MD, Shareena Nusz.  INR POC: 1.9.  Cuvette Lot#: [id number].  Exp: 06/2010.    Anticoagulation Management Assessment/Plan:      The patient's current anticoagulation dose is Warfarin sodium 5 mg tabs: Use as directed by Anticoagulation Clinic.  The target INR is 2.0-3.0.  The next INR is due 05/27/2009.  Anticoagulation instructions were given to patient.  Results were reviewed/authorized by Louie Casa, PharmD.         Prior Anticoagulation Instructions: INR  2.2 Continue 7.5mg s everyday except 5mg s on Tuesdays, Thursdays and Saturdays. Recheck in 3 weeks.   Current Anticoagulation Instructions: INR 1.9  TAKE 2 TABLETS TONIGHT THEN RESUME REGULAR SCHEDULE OF 1.5 TABLETS EVERYDAY EXCEPT 1 TABLET ON TUESDAYS, THURSDAYS, SATURDAYS.  RECHECK IN 3 WEEKS

## 2010-04-10 NOTE — Letter (Signed)
Summary: [facility] EP Return Patient   [facility] EP Return Patient   Imported By: Roderic Ovens 10/18/2009 12:40:39  _____________________________________________________________________  External Attachment:    Type:   Image     Comment:   External Document

## 2010-04-10 NOTE — Assessment & Plan Note (Signed)
Summary: f26m/jss   Primary Provider:  Dr [name] . Polite  CC:  follow up 3 month.  Pt states she is feeling well.  She is just following up with Dr. Graciela Husbands.  History of Present Illness: [name] is seen in followup for atrial fibrillation following pulmonary vein isolation twice, first at [facility] and then at [facility] in 12/10.  She has recurrent atrial flutter which was cardioverted two weeks ago and as reverted to atrial flutter with a RVR  She is asymptomatic  She is s/p pacer for bradycardia.    She has COPD-which is problematic. She continues to smoke  She has recently seen Dr Macon Large at [facility] who was not snaguine about likelihood of success of repeat procedure and was also concerned about the possibility of dementia.  He thought AV junction ablation might be gthe better choice   Current Medications (verified): 1)  Potassium Chloride Crys Cr 20 Meq Cr-Tabs (Potassium Chloride Crys Cr) .... 2 Every Am and 4 Every Pm 2)  Advair Diskus 250-50 Mcg/dose  Misc (Fluticasone-Salmeterol) .... One Puff Two Times A Day 3)  Spiriva Handihaler 18 Mcg  Caps (Tiotropium Bromide Monohydrate) .... One Capsule Once Daily 4)  Nasonex 50 Mcg/act  Susp (Mometasone Furoate) .... Two Puff Ea Nostril Once Daily As Needed 5)  Pletal 100 Mg  Tabs (Cilostazol) .... Take 1 Tablet By Mouth Once A Day 6)  Nexium 40 Mg  Cpdr (Esomeprazole Magnesium) .... By Mouth Daily. Take One Half Hour Before Eating. 7)  Mucinex 600 Mg  Xr12h-Tab (Guaifenesin) .... One By Mouth Two Times A Day 8)  Lipitor 20 Mg  Tabs (Atorvastatin Calcium) .... Once Daily 9)  Glucosamine 1500 Complex   Caps (Glucosamine-Chondroit-Vit C-Mn) .... Two Times A Day 10)  Cardizem 120 Mg Tabs (Diltiazem Hcl) .Marland Kitchen.. 1 Tab in Am 2 Tabs in Pm 11)  Fe-Caps 250 Mg Cr-Caps (Ferrous Sulfate) .... One Daily 12)  Xopenex Hfa 45 Mcg/act Aero (Levalbuterol Tartrate) .... 2 Puffs Every 6 Hours As Needed 13)  Furosemide 40 Mg Tabs (Furosemide) .... Take 2  Tab Two Times A  Day 14)  Spironolactone 25 Mg Tabs (Spironolactone) .... Take One Tablet Once Daily 15)  Bayer Low Strength 81 Mg Tbec (Aspirin) .Marland Kitchen.. 1 By Mouth Daily 16)  Magnesium .Marland Kitchen.. 1 By Mouth Daiky 17)  Glipizide 5 Mg Tabs (Glipizide) .Marland Kitchen.. 1 1/2 Once Daily 18)  Fish Oil 1000 Mg Caps (Omega-3 Fatty Acids) .... Take 1 Capsule By Mouth Two Times A Day 19)  Warfarin Sodium 5 Mg Tabs (Warfarin Sodium) .... Use As Directed By Anticoagulation Clinic 20)  Tikosyn 250 Mcg Caps (Dofetilide) .... Take One Capsule Every 12 Hours  Allergies (verified): No Known Drug Allergies  Past History:  Past Medical History: Last updated: 08/20/2009 HYPERSOMNIA, ASSOCIATED WITH SLEEP APNEA (ICD-780.53) Amiodarone Lung Toxicity RENAL DISEASE, CHRONIC  Creatinine 1.11 January 2009 CVA (ICD-434.91) DM (ICD-250.00) cigarette abuse  DIASTOLIC HEART FAILURE, ACUTE ON CHRONIC (ICD-428.33) PACEMAKER, PERMANENT   ATRIAL FIBRILLATION (ICD-427.31)       -AV ablation 9/09 [facility] per Dr Sampson Goon  HYPERLIPIDEMIA (ICD-272.4) GERD (ICD-530.81) CORONARY ARTERY DISEASE not sure of htis 11/10 COPD (ICD-496)     -FeV1 73%  DLCO 53% 5/09 PAD with hx right iliac/SFA stenting and left leg PTA  Past Surgical History: Last updated: 03/12/2009 Pulmonary vein isolation x2 once at [facility] and once at [facility] History: Last updated: 06/24/08 That mother died of unknown cause.  Father died   with heart  disease.  The patient has two sisters who are alive and well.   No brothers.   Social History: Last updated: 06/01/2008 1/2 cup decaff  [employer]ai-chi & water aerobics - garden, active lifestyle Retired  Married  Tobacco Use - Yes.  Alcohol Use - yes  Vital Signs:  Patient profile:   77 year old female Height:      65.5 inches Weight:      183 pounds BMI:     30.10 Pulse rate:   124 / minute BP sitting:   111 / 71  (left arm) Cuff size:   large  Vitals Entered By: Judithe Modest CMA (October 03, 2009 1:32 PM)   PPM  Specifications Following MD:  Sherryl Manges, MD     PPM Vendor:  [employer] Jude     PPM Model Number:  [id number]     PPM Serial Number:  [id number] PPM DOI:  01/23/2004     PPM Implanting MD:  Sherryl Manges, MD  Lead 1    Location: RA     DOI: 01/23/2004     Model #: 1642     Serial #: [id number]     Status: active Lead 2    Location: RV     DOI: 01/23/2004     Model #: 1646T     Serial #: [id number]     Status: active  Magnet Response Rate:  BOL 98.6  Indications:  TACHY BRADY SYNDROME   PPM Follow Up Pacer Dependent:  No      Episodes Coumadin:  Yes  Parameters Mode:  DDIR     Lower Rate Limit:  70     Upper Rate Limit:  90 Paced AV Delay:  275     Sensed AV Delay:  250  Impression & Recommendations:  Problem # 1:  ATRIAL FIBRILLATION (ICD-427.31) we spent approximately 55 minutes discussing options related to atrial fibrillation and its management. At the end of the discussion we have elected to proceed with AV junction ablation. We discussed potential benefits as well as potential risks including but not limited to worsening of social status and potential need for resynchronization upgrade. She understands these risks. Her family in the form of her son and husband were there and they understood as well. ejection  fraction was 60% in June 2011 by echo Her updated medication list for this problem includes:    Pletal 100 Mg Tabs (Cilostazol) .Marland Kitchen... Take 1 tablet by mouth once a day    Bayer Low Strength 81 Mg Tbec (Aspirin) .Marland Kitchen... 1 by mouth daily    Warfarin Sodium 5 Mg Tabs (Warfarin sodium) ..... Use as directed by anticoagulation clinic    Tikosyn 250 Mcg Caps (Dofetilide) .Marland Kitchen... Take one capsule every 12 hours

## 2010-04-10 NOTE — Medication Information (Signed)
Summary: rov/ewj  Anticoagulant Therapy  Managed by: Bethena Midget, RN, BSN Referring MD: Sherryl Manges MD PCP: Dr [name] . Polite Supervising MD: Shirlee Latch MD, Waller Marcussen Indication 1: Atrial Fibrillation (ICD-427.31) Lab Used: [facility] Site: [facility] INR POC 1.5 INR RANGE 2.0-3.0  Dietary changes: no    Health status changes: yes       Details: Stage 1 breast CA- pending surgery seeing Dr Graciela Husbands for clearance today  Bleeding/hemorrhagic complications: no    Recent/future hospitalizations: no    Any changes in medication regimen? no    Recent/future dental: no  Any missed doses?: no       Is patient compliant with meds? yes      Comments: Seeing Dr Graciela Husbands today  Allergies: No Known Drug Allergies  Anticoagulation Management History:      The patient is taking warfarin and comes in today for a routine follow up visit.  Positive risk factors for bleeding include an age of 75 years or older, history of CVA/TIA, and presence of serious comorbidities.  The bleeding index is 'high risk'.  Positive CHADS2 values include History of CHF, Age > 75 years old, History of Diabetes, and Prior Stroke/CVA/TIA.  The start date was 01/20/2004.  Her last INR was 2.0 ratio.  Anticoagulation responsible provider: Shirlee Latch MD, Alyvia Derk.  INR POC: 1.5.  Cuvette Lot#: [id number].  Exp: 02/2011.    Anticoagulation Management Assessment/Plan:      The patient's current anticoagulation dose is Warfarin sodium 5 mg tabs: Use as directed by Anticoagulation Clinic.  The target INR is 2.0-3.0.  The next INR is due 12020/11/1409.  Anticoagulation instructions were given to patient.  Results were reviewed/authorized by Bethena Midget, RN, BSN.  She was notified by Bethena Midget, RN, BSN.         Prior Anticoagulation Instructions: INR 1.9  Take 2 tablets today, then resume same dosage 1.5 tablets daily except 1 tablet on Saturdays.  Recheck in 4 weeks.    Current Anticoagulation Instructions: INR 1.5 Today take 10mg s  then change dose to 7.5mg s everyday. Recheck in 2 weeks.

## 2010-04-10 NOTE — Letter (Signed)
Summary: Cardioversion/TEE Instructions  Architectural technologist, Main Office  [address]   [place], [place] [address]   Phone: [phone]  Fax: [phone]    Cardioversion / TEE Cardioversion Instructions  You are scheduled for a  TEE Cardioversion on  Thursday August 22, 2009 with Dr. Eden Emms.   Please arrive at [facility] at 7:30 a.m. on the day of your procedure.  1)   DIET:  A)   Nothing to eat or drink after midnight except your medications with a sip of water.   2)   MAKE SURE YOU TAKE YOUR COUMADIN.  3)   A)   DO NOT TAKE these medications before your procedure:      DO NOT TAKE FUROSEMIDE OR GLIPIZIDE THE MORNING OF PROCEDURE.                 PLEASE DO NOT SMOKE OR DRINK ALCOHOL THE NIGHT BEFORE OR MORNING OF PROCEDURE.     B)   YOU MAY TAKE ALL of your remaining medications with a small amount of water.    4)  Must have a responsible person to drive you home.  5)   Bring a current list of your medications and current insurance cards.   * Special Note:  Every effort is made to have your procedure done on time. Occasionally there are emergencies that present themselves at the hospital that may cause delays. Please be patient if a delay does occur.  * If you have any questions after you get home, please call the office at [phone].

## 2010-04-10 NOTE — Medication Information (Signed)
Summary: rov/tm  Anticoagulant Therapy  Managed by: Bethena Midget, RN, BSN Referring MD: Sherryl Manges MD PCP: Dr [name] . Polite Supervising MD: Jens Som MD, Arlys John Indication 1: Atrial Fibrillation (ICD-427.31) Lab Used: [facility] Site: [facility] INR POC 2.2 INR RANGE 2.0-3.0  Dietary changes: no    Health status changes: no    Bleeding/hemorrhagic complications: no    Recent/future hospitalizations: no    Any changes in medication regimen? no    Recent/future dental: no  Any missed doses?: no       Is patient compliant with meds? yes       Allergies: No Known Drug Allergies  Anticoagulation Management History:      The patient is taking warfarin and comes in today for a routine follow up visit.  Positive risk factors for bleeding include an age of 75 years or older, history of CVA/TIA, and presence of serious comorbidities.  The bleeding index is 'high risk'.  Positive CHADS2 values include History of CHF, Age > 83 years old, History of Diabetes, and Prior Stroke/CVA/TIA.  The start date was 01/20/2004.  Her last INR was 2.2 RATIO.  Anticoagulation responsible provider: Jens Som MD, Arlys John.  INR POC: 2.2.  Cuvette Lot#: [id number].  Exp: 04/2010.    Anticoagulation Management Assessment/Plan:      The patient's current anticoagulation dose is Warfarin sodium 5 mg tabs: Use as directed by Anticoagulation Clinic.  The target INR is 2.0-3.0.  The next INR is due 05/06/2009.  Anticoagulation instructions were given to patient.  Results were reviewed/authorized by Bethena Midget, RN, BSN.  She was notified by Bethena Midget, RN, BSN.         Prior Anticoagulation Instructions: INR 2.1  Take 2 tablets (10mg ) today then increase dose to 1.5 tablets (7.5mg ) daily except 1 tablet (5mg ) on Tuesdays and Saturdays. Recheck in 1 week.  Current Anticoagulation Instructions: INR 2.2 Continue 7.5mg s everyday except 5mg s on Tuesdays, Thursdays and Saturdays. Recheck in 3 weeks.

## 2010-04-10 NOTE — Progress Notes (Signed)
Summary: wants to talk with dr Graciela Husbands today  Phone Note Call from Patient   [name]: Patient Reason for Call: Talk to Doctor Summary of Call: pt wants to talk to dr Graciela Husbands today-would not say why she's calling- pt [phone] Initial call taken by: Glynda Jaeger,  October 04, 2009 1:18 PM  Follow-up for Phone Call        10/04/09--1400pm--referred call to amber--nt Follow-up by: Ledon Snare, RN,  October 04, 2009 1:59 PM

## 2010-04-10 NOTE — Progress Notes (Signed)
Summary: lab work  Phone Note [employer] back at [employer] [phone]   Call placed by: Gypsy Balsam RN BSN,  September 11, 2009 12:21 PM Summary of Call: Called patient and left message on machine to call.  Per Dr Graciela Husbands, pt needs BMP and then will add Digoxin, dose based on renal function. Gypsy Balsam RN BSN  September 11, 2009 12:21 PM   Follow-up for Phone Call        Spoke with patient.  Carotid per Dr Excell Seltzer and BMP on Thursday, July 14. Gypsy Balsam RN BSN  September 16, 2009 8:55 AM

## 2010-04-10 NOTE — Medication Information (Signed)
Summary: rov/sp  Anticoagulant Therapy  Managed by: Elaina Pattee, PharmD Referring MD: Sherryl Manges MD PCP: Dr [name] . Polite Supervising MD: Gala Romney MD, Reuel Boom Indication 1: Atrial Fibrillation (ICD-427.31) Lab Used: [facility] Site: [facility] INR POC 3.0 INR RANGE 2.0-3.0  Dietary changes: yes       Details: Has eaten fewer greens.  Health status changes: no    Bleeding/hemorrhagic complications: no    Recent/future hospitalizations: yes       Details: DCCV on 08/22/09. Pt states it was not successful.  Any changes in medication regimen? no    Recent/future dental: no  Any missed doses?: no       Is patient compliant with meds? yes       Allergies: No Known Drug Allergies  Anticoagulation Management History:      The patient is taking warfarin and comes in today for a routine follow up visit.  Positive risk factors for bleeding include an age of 75 years or older, history of CVA/TIA, and presence of serious comorbidities.  The bleeding index is 'high risk'.  Positive CHADS2 values include History of CHF, Age > 75 years old, History of Diabetes, and Prior Stroke/CVA/TIA.  The start date was 01/20/2004.  Her last INR was 2.2 RATIO.  Anticoagulation responsible provider: Kenyanna Grzesiak MD, Reuel Boom.  INR POC: 3.0.  Cuvette Lot#: [id number].  Exp: 10/2010.    Anticoagulation Management Assessment/Plan:      The patient's current anticoagulation dose is Warfarin sodium 5 mg tabs: Use as directed by Anticoagulation Clinic.  The target INR is 2.0-3.0.  The next INR is due 09/25/2009.  Anticoagulation instructions were given to patient.  Results were reviewed/authorized by Elaina Pattee, PharmD.  She was notified by Elaina Pattee, PharmD.         Prior Anticoagulation Instructions: INR 2.7  Continue same dose of 1 1/2 tablets every day except 1 tablet on Saturday  Current Anticoagulation Instructions: INR 3.0. Take 1 tablet today, then take 1.5 tablets daily except 1 tablet on  Saturdays. Recheck in 3 weeks.

## 2010-04-10 NOTE — Cardiovascular Report (Signed)
Summary: Office Visit   Office Visit   Imported By: Roderic Ovens 01/09/2010 16:12:04  _____________________________________________________________________  External Attachment:    Type:   Image     Comment:   External Document

## 2010-04-10 NOTE — Progress Notes (Signed)
Summary: medication question - sample advair and spiriva left up front  Phone Note Call from Patient Call back at Home Phone [phone]   [name]: Patient Call For: wright Summary of Call: Pt states her advair and spiriva won'r arrive before Tues or Wed says she has enough to last until Sat wants to know if its ok to miss these days pls advise. Initial call taken by: Darletta Moll,  March 21, 2010 12:29 PM  Follow-up for Phone Call        will leave sample of advair 250/50 and spiriva up front for pt to pick up---lmomtcb Randell Loop CMA  March 21, 2010 2:12 PM   called spoke with patient who states that she did receive the message left for her and has already picked up her samples.  Boone Master CNA/MA  March 24, 2010 5:24 PM

## 2010-04-10 NOTE — Letter (Signed)
Summary: [name] EP Return Patient  [facility] EP Return Patient   Imported By: Roderic Ovens 05/29/2009 13:45:11  _____________________________________________________________________  External Attachment:    Type:   Image     Comment:   External Document

## 2010-04-10 NOTE — Progress Notes (Addendum)
  Pt Dropped off 6 [employer] sent to [employer]. [name]  March 20, 2010 1:41 PM      Appended Document:  Pt came in and picked up all 6 [employer] forms.Marland Kitchen

## 2010-04-10 NOTE — Progress Notes (Signed)
Summary: REFILL  Phone Note Refill Request Message from:  Patient on July 01, 2009 10:49 AM  Refills Requested: Medication #1:  FUROSEMIDE 40 MG TABS take 2  tab two times a day SEND TO [facility]  AND SEND ANOTHER TO [facility]FI[facility]  Initial call taken by: Judie Grieve,  July 01, 2009 10:49 AM  Follow-up for Phone Call       Follow-up by: Judithe Modest CMA,  July 02, 2009 10:27 AM    Prescriptions: FUROSEMIDE 40 MG TABS (FUROSEMIDE) take 2  tab two times a day  #360 x 3   Entered by:   Judithe Modest CMA   Authorized by:   Nathen May, MD, [facility]   Signed by:   Judithe Modest CMA on 07/02/2009   Method used:   [facility]ronically to        [facility]       [address]       [place], [place]  [address]       Ph: [phone]       Fax: [phone]   RxID:   [id number]

## 2010-04-10 NOTE — Cardiovascular Report (Signed)
Summary: TTM   TTM   Imported By: Roderic Ovens 02/21/2010 15:13:23  _____________________________________________________________________  External Attachment:    Type:   Image     Comment:   External Document

## 2010-04-10 NOTE — Assessment & Plan Note (Signed)
Summary: pc2   Visit Type:  PPM-[employer] Primary Provider:  Dr [name] . Polite  CC:  shortness of breath, pt does not have med list, and stated all Rx's are the same.  History of Present Illness: [name] is seen in followup for atrial fibrillation following pulmonary vein isolation twice, first at [facility] and then at [facility] in 12/10.  She has recurrent atrial flutter which wasrepeatedly cardioverted w frequent and rapid reversion.  She recently underwent AV ablation which was assoicated wi marked improvement in symptonms  She  has an exacerbation of her copd and also has been diagnosed with breast cancer for which she is to undergo lumpectomy  She is s/p pacer for bradycardia.    She has COPD-which is problematic. She continues to smoke     Problems Prior to Update: 1)  Hypotension, Unspecified  (ICD-458.9) 2)  Atrial Fibrillation  (ICD-427.31) 3)  Diastolic Heart Failure, Acute On Chronic  (ICD-428.33) 4)  Coronary Artery Disease  (ICD-414.00) 5)  Nicotine Addiction  (ICD-305.1) 6)  Pacemaker, Permanent  (ICD-V45.01) 7)  Carotid Artery Stenosis, Without Infarction  (ICD-433.10) 8)  Cva  (ICD-434.91) 9)  Dm  (ICD-250.00) 10)  Obstructive Sleep Apnea  (ICD-327.23) 11)  [name]  (ICD-443.9) 12)  Hyperlipidemia  (ICD-272.4) 13)  Gerd  (ICD-530.81) 14)  COPD  (ICD-496)  Current Medications (verified): 1)  Potassium Chloride Crys Cr 20 Meq Cr-Tabs (Potassium Chloride Crys Cr) .... 2 Every Am and 3 Every Pm 2)  Advair Diskus 250-50 Mcg/dose  Misc (Fluticasone-Salmeterol) .... One Puff Two Times A Day 3)  Spiriva Handihaler 18 Mcg  Caps (Tiotropium Bromide Monohydrate) .... One Capsule Once Daily 4)  Fluticasone Propionate 50 Mcg/act Susp (Fluticasone Propionate) .... 2 Sprays Each Nostril At Bedtime 5)  Pletal 100 Mg  Tabs (Cilostazol) .... Take 1 Tablet By Mouth Two Times A Day 6)  Nexium 40 Mg  Cpdr (Esomeprazole Magnesium) .... Take One Half Hour Before Eating. Take One Tablet By Mouth  Every Other Day 7)  Mucinex 600 Mg  Xr12h-Tab (Guaifenesin) .... One By Mouth Two Times A Day 8)  Lipitor 20 Mg  Tabs (Atorvastatin Calcium) .... Once Daily 9)  Glucosamine 1500 Complex   Caps (Glucosamine-Chondroit-Vit C-Mn) .... Two Times A Day 10)  Fe-Caps 250 Mg Cr-Caps (Ferrous Sulfate) .... One Daily 11)  Xopenex Hfa 45 Mcg/act Aero (Levalbuterol Tartrate) .... 2 Puffs Every 6 Hours As Needed 12)  Furosemide 40 Mg Tabs (Furosemide) .... Take 2  Tab Two Times A Day 13)  Bayer Low Strength 81 Mg Tbec (Aspirin) .Marland Kitchen.. 1 By Mouth Daily 14)  Magnesium .Marland Kitchen.. 1 By Mouth Daily 15)  Glipizide 5 Mg Tabs (Glipizide) .Marland Kitchen.. 1 1/2 Once Daily 16)  Fish Oil 1000 Mg Caps (Omega-3 Fatty Acids) .... Take 1 Capsule By Mouth Two Times A Day 17)  Warfarin Sodium 5 Mg Tabs (Warfarin Sodium) .... Use As Directed By Anticoagulation Clinic 18)  Avelox 400 Mg Tabs (Moxifloxacin Hcl) .Marland Kitchen.. 1 By Mouth Once Daily Until Gone 19)  Prednisone 10 Mg Tabs (Prednisone) .... 4 Each Am X 2 Days, 3 Each Am X 2 Days, 2 Each Am X 2 Days, 1 Each Am X 2 Days Then Stop  Allergies (verified): No Known Drug Allergies  Past History:  Past Medical History: Last updated: 12/30/2009 HYPERSOMNIA, ASSOCIATED WITH SLEEP APNEA (ICD-780.53) Amiodarone Lung Toxicity RENAL DISEASE, CHRONIC  Creatinine 1.11 January 2009 CVA (ICD-434.91) DM (ICD-250.00) cigarette abuse  DIASTOLIC HEART FAILURE, ACUTE ON CHRONIC (ICD-428.33) PACEMAKER,  PERMANENT   ATRIAL FIBRILLATION (ICD-427.31)       -AV ablation 9/09 [facility] per Dr Sampson Goon       - AV node ablation 09/11 Dr. Joseph Berkshire (ICD-272.4) GERD (ICD-530.81) CORONARY ARTERY DISEASE not sure of htis 11/10 COPD (ICD-496)     -FeV1 73%  DLCO 53% 5/09 PAD with hx right iliac/SFA stenting and left leg PTA Invasive ductal carcimoma-left breast-2011  Past Surgical History: Last updated: 03/12/2009 Pulmonary vein isolation x2 once at [facility] and once at [facility] History: Last  updated: 06-27-08 That mother died of unknown cause.  Father died   with heart disease.  The patient has two sisters who are alive and well.   No brothers.   Social History: Last updated: 10/25/2009 1/2 cup decaff  Tai-chi & water aerobics - garden, active lifestyle Retired  Married  Tobacco Use - Yes.  Alcohol Use - yes Patient is a current smoker.   Risk Factors: Smoking Status: current (10/25/2009) Packs/Day: 1.0 (10/25/2009)  Vital Signs:  Patient profile:   77 year old female Height:      65.5 inches Weight:      184.13 pounds BMI:     30.28 Pulse rate:   72 / minute BP sitting:   111 / 69  (right arm) Cuff size:   regular  Vitals Entered By: Caralee Ates CMA (December 31, 2009 12:52 PM)   Physical Exam  General:  The patient was alert and oriented in no acute distress. HEENT Normal.  Neck veins were flat, carotids were brisk.  Lungs were clear.  Heart sounds were regular without murmurs or gallops.  Abdomen was soft with active bowel sounds. There is no clubbing cyanosis or edema. Skin Warm and dry    PPM Specifications Following MD:  Sherryl Manges, MD     PPM Vendor:  [employer]     PPM Model Number:  [id number]     PPM Serial Number:  [id number] PPM DOI:  01/23/2004     PPM Implanting MD:  Sherryl Manges, MD  Lead 1    Location: RA     DOI: 01/23/2004     Model #: [id number]     Serial #: [id number]     Status: active Lead 2    Location: RV     DOI: 01/23/2004     Model #: [id number]     Serial #: [id number]     Status: active  Magnet Response Rate:  BOL 98.6  Indications:  TACHY BRADY SYNDROME   PPM Follow Up Battery Voltage:  2.78 V     Battery Est. Longevity:  4.50-5.75 yrs     Pacer Dependent:  No     Right Ventricle  Amplitude: PACED mV, Impedance: 472 ohms, Threshold: 0.5 V at 0.5 msec  Episodes MS Episodes:  0     Coumadin:  Yes Ventricular High Rate:  0     Ventricular Pacing:  >99%  Parameters Mode:  VVI     Lower Rate Limit:  70     Upper Rate Limit:  90 Paced AV  Delay:  275     Sensed AV Delay:  250 Next Cardiology Appt Due:  06/09/2010 Tech Comments:  NORMAL DEVICE FUNCTION.  CHANGED LRL FROM 80 TO 70 bpm DUE TO PT HAVING ABLATION.  ROV IN 6 MTHS W/SK. Vella Kohler  December 31, 2009 12:58 PM  Impression & Recommendations:  Problem # 1:  ATRIAL FIBRILLATION (ICD-427.31) she has atrial fibrillation and  is now status post AV junction ablation with 100% ventricular pacing. She has a history of a stroke and so when she comes time for lumpectomy, bridging with heparin and/or Lovenox would be appropriate periprocedurally. Her updated medication list for this problem includes:    Pletal 100 Mg Tabs (Cilostazol) .Marland Kitchen... Take 1 tablet by mouth two times a day    Bayer Low Strength 81 Mg Tbec (Aspirin) .Marland Kitchen... 1 by mouth daily    Warfarin Sodium 5 Mg Tabs (Warfarin sodium) ..... Use as directed by anticoagulation clinic  Problem # 2:  HYPOTENSION, UNSPECIFIED (ICD-458.9) blood pressure is improved with AV junction ablation  Problem # 3:  CORONARY ARTERY DISEASE (ICD-414.00) stable;  her last echo was 2009 demonstrating normal left ventricular function. A Myoview in 2010 was aborted because of a rapid ventricular response Her updated medication list for this problem includes:    Pletal 100 Mg Tabs (Cilostazol) .Marland Kitchen... Take 1 tablet by mouth two times a day    Bayer Low Strength 81 Mg Tbec (Aspirin) .Marland Kitchen... 1 by mouth daily    Warfarin Sodium 5 Mg Tabs (Warfarin sodium) ..... Use as directed by anticoagulation clinic  Problem # 4:  PACEMAKER, PERMANENT (ICD-V45.01) Device parameters and data were reviewed and no changes were made

## 2010-04-10 NOTE — Medication Information (Signed)
Summary: Coumadin Clinic  Anticoagulant Therapy  Managed by: Weston Brass, PharmD Referring MD: Sherryl Manges MD PCP: Dr [name] . Polite Supervising MD: Excell Seltzer MD, Casimiro Needle Indication 1: Atrial Fibrillation (ICD-427.31) Lab Used: [facility] Site: [facility] INR POC 2.7 INR RANGE 2.0-3.0  Dietary changes: yes       Details: has been on vacation so diet is slightly different  Health status changes: no    Bleeding/hemorrhagic complications: no    Recent/future hospitalizations: yes       Details: pending TEE/DCCV on Thursday  Any changes in medication regimen? no    Recent/future dental: no  Any missed doses?: no       Is patient compliant with meds? yes       Allergies: No Known Drug Allergies  Anticoagulation Management History:      The patient is taking warfarin and comes in today for a routine follow up visit.  Positive risk factors for bleeding include an age of 32 years or older, history of CVA/TIA, and presence of serious comorbidities.  The bleeding index is 'high risk'.  Positive CHADS2 values include History of CHF, Age > 44 years old, History of Diabetes, and Prior Stroke/CVA/TIA.  The start date was 01/20/2004.  Her last INR was 2.2 RATIO.  Anticoagulation responsible provider: Excell Seltzer MD, Casimiro Needle.  INR POC: 2.7.  Cuvette Lot#: [id number].  Exp: 10/2010.    Anticoagulation Management Assessment/Plan:      The patient's current anticoagulation dose is Warfarin sodium 5 mg tabs: Use as directed by Anticoagulation Clinic.  The target INR is 2.0-3.0.  The next INR is due 09/03/2009.  Anticoagulation instructions were given to patient.  Results were reviewed/authorized by Weston Brass, PharmD.  She was notified by Weston Brass PharmD.         Prior Anticoagulation Instructions: INR 1.8  Take 2 tablets today and tomorrow then resume same dose of 1 1/2 tablets every day except 1 tablet on Saturday  Current Anticoagulation Instructions: INR 2.7  Continue same dose of 1 1/2  tablets every day except 1 tablet on Saturday

## 2010-04-10 NOTE — Medication Information (Signed)
Summary: Prior Authorization & Denied for [employer] / [facility]  Prior Authorization & Denied for [employer] / [employer]   Imported By: Lennie Odor 11/13/2009 16:40:34  _____________________________________________________________________  External Attachment:    Type:   Image     Comment:   External Document

## 2010-04-10 NOTE — Letter (Signed)
Summary: [facility]   [facility]   Imported By: Roderic Ovens 04/03/2010 11:49:21  _____________________________________________________________________  External Attachment:    Type:   Image     Comment:   External Document

## 2010-04-10 NOTE — Medication Information (Signed)
Summary: rov/tm  Anticoagulant Therapy  Managed by: Cloyde Reams, RN, BSN Referring MD: Sherryl Manges MD PCP: Dr [name] . Polite Supervising MD: Clifton James MD, Cristal Deer Indication 1: Atrial Fibrillation (ICD-427.31) Lab Used: [facility] Site: [facility] INR POC 1.9 INR RANGE 2.0-3.0  Dietary changes: yes       Details: Decr amt of vit K in diet  Health status changes: no    Bleeding/hemorrhagic complications: no    Recent/future hospitalizations: no    Any changes in medication regimen? no    Recent/future dental: no  Any missed doses?: no       Is patient compliant with meds? yes       Allergies: No Known Drug Allergies  Anticoagulation Management History:      The patient is taking warfarin and comes in today for a routine follow up visit.  Positive risk factors for bleeding include an age of 75 years or older, history of CVA/TIA, and presence of serious comorbidities.  The bleeding index is 'high risk'.  Positive CHADS2 values include History of CHF, Age > 19 years old, History of Diabetes, and Prior Stroke/CVA/TIA.  The start date was 01/20/2004.  Her last INR was 2.0 ratio.  Anticoagulation responsible provider: Clifton James MD, Cristal Deer.  INR POC: 1.9.  Cuvette Lot#: [id number].  Exp: 01/2011.    Anticoagulation Management Assessment/Plan:      The patient's current anticoagulation dose is Warfarin sodium 5 mg tabs: Use as directed by Anticoagulation Clinic.  The target INR is 2.0-3.0.  The next INR is due 12/17/2009.  Anticoagulation instructions were given to patient.  Results were reviewed/authorized by Cloyde Reams, RN, BSN.  She was notified by Cloyde Reams RN.         Prior Anticoagulation Instructions: INR 2.2  Continue taking Coumadin 1.5 tabs (7.5 mg) on all days except Coumadin 1 tab (5 mg) on Saturdays.  Return to clinic in 7-10 days following ablation.  Current Anticoagulation Instructions: INR 1.9  Take 2 tablets today, then resume same dosage 1.5  tablets daily except 1 tablet on Saturdays.  Recheck in 4 weeks.

## 2010-04-10 NOTE — Medication Information (Signed)
Summary: ROV/LB  Anticoagulant Therapy  Managed by: Cloyde Reams, RN Referring MD: Sherryl Manges MD PCP: Dr [name] . Polite Supervising MD: Jens Som MD, Arlys John Indication 1: Atrial Fibrillation (ICD-427.31) Lab Used: [facility] Site: [facility] INR POC 2.1 INR RANGE 2.0-3.0  Dietary changes: no    Health status changes: no    Bleeding/hemorrhagic complications: no    Recent/future hospitalizations: no    Any changes in medication regimen? no    Recent/future dental: no  Any missed doses?: no       Is patient compliant with meds? yes       Allergies: No Known Drug Allergies  Anticoagulation Management History:      The patient is taking warfarin and comes in today for a routine follow up visit.  Positive risk factors for bleeding include an age of 75 years or older, history of CVA/TIA, and presence of serious comorbidities.  The bleeding index is 'high risk'.  Positive CHADS2 values include History of CHF, Age > 28 years old, History of Diabetes, and Prior Stroke/CVA/TIA.  The start date was 01/20/2004.  Her last INR was 2.2 RATIO.  Anticoagulation responsible provider: Jens Som MD, Arlys John.  INR POC: 2.1.  Cuvette Lot#: [id number].  Exp: 04/2010.    Anticoagulation Management Assessment/Plan:      The patient's current anticoagulation dose is Warfarin sodium 5 mg tabs: Use as directed by Anticoagulation Clinic.  The target INR is 2.0-3.0.  The next INR is due 04/08/2009.  Anticoagulation instructions were given to patient.  Results were reviewed/authorized by Cloyde Reams, RN.  She was notified by Lew Dawes, PharmD Candidate.         Prior Anticoagulation Instructions: INR 1.8  TAKE 2 TABLETS TONIGHT (10MG ) THEN CONTINUE TAKING 1.5 TABLETS (7.5MG ) DAILY EXCEPT TAKE 1 TABLET (5MG ). RECHECK IN 1 WEEK.  Current Anticoagulation Instructions: INR 2.1  Take 2 tablets (10mg ) today then increase dose to 1.5 tablets (7.5mg ) daily except 1 tablet (5mg ) on Tuesdays and Saturdays.  Recheck in 1 week.

## 2010-04-10 NOTE — Progress Notes (Signed)
Summary: refill  Phone Note Refill Request Message from:  Patient on July 04, 2009 1:26 PM  Refills Requested: Medication #1:  FUROSEMIDE 40 MG TABS take 2  tab two times a day [facility] [address] [phone] 30days and send 90 supply to [facility] [phone] ID# [id number]  Initial call taken by: Judie Grieve,  July 04, 2009 1:29 PM  Follow-up for Phone Call        Rx already sent to [facility].      Prescriptions: FUROSEMIDE 40 MG TABS (FUROSEMIDE) take 2  tab two times a day  #120 x 0   Entered by:   Judithe Modest CMA   Authorized by:   Nathen May, MD, [facility]   Signed by:   Judithe Modest CMA on 07/05/2009   Method used:   [facility] to        [facility] [address] [phone]* (retail)       [address]       [place], [place]  [address]       Ph: [phone] or [phone]       Fax: [phone]   RxID:   [id number]

## 2010-04-10 NOTE — Assessment & Plan Note (Signed)
Summary: Pulmonary OV   Primary Provider/Referring Provider:  Dr [name] . Polite  CC:  Pt c/o productive cough with pale yellow mucus x 3 days.  Pt scheduled for AV node ablation August 31.  History of Present Illness:   77 year old, white female with history of chronic obstructive lung disease, asthmatic bronchitis, and obstructive sleep apnea.   Active smoker and has failed smoking cessation on multiple attempts.   February 12, 2009 10:48 AM To see EP MD at [facility].  Having more PAF.  For a potential ablation.  Had prior ablation at [facility]. Recently cough ok. Had diuretics increased 10/10.  Still on same doses. Had a stress test but not able to complete due to hypotension.  Stopped tikosyn and on Lamar and it makes pt nauseated.  The weight is up.  The pt notes less edema  April 22, 2009 2:17 PM Had lots of salt over the past 04/19/09 weekend so is up on the weight.  The pt is not having much cough now.  The pt is  still dyspneic with exertion.  There is no chest pain.  The pt did not undergo DCCV.  She is on Tikosyn as of now for Afib.   The Multaq was stopped.  She did not get a repeat ablation at [facility].  July 01, 2009 12:09 PM Cough ok,  dyspnea ok,  worse when humid and rainy.  Feels ok.  Smoking still. Pt denies any significant sore throat, nasal congestion or excess secretions, fever, chills, sweats, unintended weight loss, pleurtic or exertional chest pain, orthopnea PND, or leg swelling   October 25, 2009 2:51 PM Pt is to have AV node ablation on 8/31.  Bad cough one day ago.  Non productive with the cough.  Dyspnea is about the same.  Notes sl wheezing.    [facility] to [facility] done [facility]. No other new issues.  The pt still smokes 1PPD!!   Preventive Screening-Counseling & Management  Alcohol-Tobacco     Smoking Status: current     Smoking Cessation Counseling: yes     Smoke Cessation Stage: contemplative     Packs/Day: 1.0     Year Started: 1956  Current Medications  (verified): 1)  Potassium Chloride Crys Cr 20 Meq Cr-Tabs (Potassium Chloride Crys Cr) .... 2 Every Am and 2 Every Pm 2)  Advair Diskus 250-50 Mcg/dose  Misc (Fluticasone-Salmeterol) .... One Puff Two Times A Day 3)  Spiriva Handihaler 18 Mcg  Caps (Tiotropium Bromide Monohydrate) .... One Capsule Once Daily 4)  Nasonex 50 Mcg/act  Susp (Mometasone Furoate) .... Two Puff Ea Nostril Once Daily At Bedtime 5)  Pletal 100 Mg  Tabs (Cilostazol) .... Take 1 Tablet By Mouth Once A Day 6)  Nexium 40 Mg  Cpdr (Esomeprazole Magnesium) .... Take One Half Hour Before Eating. Take One Tablet By Mouth Every Other Day 7)  Mucinex 600 Mg  Xr12h-Tab (Guaifenesin) .... One By Mouth Two Times A Day 8)  Lipitor 20 Mg  Tabs (Atorvastatin Calcium) .... Once Daily 9)  Glucosamine 1500 Complex   Caps (Glucosamine-Chondroit-Vit C-Mn) .... Two Times A Day 10)  Cardizem 120 Mg Tabs (Diltiazem Hcl) .... Take 1 Tablet By Mouth Every Morning and Take 2 Tab By Mouth At Bedtime 11)  Fe-Caps 250 Mg Cr-Caps (Ferrous Sulfate) .... One Daily 12)  Xopenex Hfa 45 Mcg/act Aero (Levalbuterol Tartrate) .... 2 Puffs Every 6 Hours As Needed 13)  Furosemide 40 Mg Tabs (Furosemide) .... Take 2  Tab  Two Times A Day 14)  Spironolactone 25 Mg Tabs (Spironolactone) .... Take One Tablet Once Daily 15)  Bayer Low Strength 81 Mg Tbec (Aspirin) .Marland Kitchen.. 1 By Mouth Daily 16)  Magnesium .Marland Kitchen.. 1 By Mouth Daily 17)  Glipizide 5 Mg Tabs (Glipizide) .Marland Kitchen.. 1 1/2 Once Daily 18)  Fish Oil 1000 Mg Caps (Omega-3 Fatty Acids) .... Take 1 Capsule By Mouth Two Times A Day 19)  Warfarin Sodium 5 Mg Tabs (Warfarin Sodium) .... Use As Directed By Anticoagulation Clinic  Allergies (verified): No Known Drug Allergies  Past History:  Past medical, surgical, family and social histories (including risk factors) reviewed, and no changes noted (except as noted below).  Past Medical History: Reviewed history from 08/20/2009 and no changes required. HYPERSOMNIA,  ASSOCIATED WITH SLEEP APNEA (ICD-780.53) Amiodarone Lung Toxicity RENAL DISEASE, CHRONIC  Creatinine 1.11 January 2009 CVA (ICD-434.91) DM (ICD-250.00) cigarette abuse  DIASTOLIC HEART FAILURE, ACUTE ON CHRONIC (ICD-428.33) PACEMAKER, PERMANENT   ATRIAL FIBRILLATION (ICD-427.31)       -AV ablation 9/09 [facility] per Dr Sampson Goon  HYPERLIPIDEMIA (ICD-272.4) GERD (ICD-530.81) CORONARY ARTERY DISEASE not sure of htis 11/10 COPD (ICD-496)     -FeV1 73%  DLCO 53% 5/09 PAD with hx right iliac/SFA stenting and left leg PTA  Past Surgical History: Reviewed history from 03/12/2009 and no changes required. Pulmonary vein isolation x2 once at [facility] and once at [facility]  Past Pulmonary History:  Pulmonary History: COPD     -FeV1 73%  DLCO 53% 5/09  Family History: Reviewed history from 06/01/2008 and no changes required. That mother died of unknown cause.  Father died   with heart disease.  The patient has two sisters who are alive and well.   No brothers.   Social History: Reviewed history from 06/01/2008 and no changes required. 1/2 cup decaff  Tai-chi & water aerobics - garden, active lifestyle Retired  Married  Tobacco Use - Yes.  Alcohol Use - yes Patient is a current smoker.   Review of Systems       The patient complains of shortness of breath with activity and non-productive cough.  The patient denies shortness of breath at rest, productive cough, coughing up blood, chest pain, irregular heartbeats, acid heartburn, indigestion, loss of appetite, weight change, abdominal pain, difficulty swallowing, sore throat, tooth/dental problems, headaches, nasal congestion/difficulty breathing through nose, sneezing, itching, ear ache, anxiety, depression, hand/feet swelling, joint stiffness or pain, rash, change in color of mucus, and fever.    Vital Signs:  Patient profile:   77 year old female Height:      65.5 inches Weight:      184.50 pounds BMI:     30.34 O2 Sat:      93 % on  Room air Temp:     98.3 degrees F oral Pulse rate:   127 / minute BP sitting:   98 / 68  (left arm) Cuff size:   regular  Vitals Entered By: Zackery Barefoot CMA (October 25, 2009 2:35 PM)  Nutrition Counseling: Patient's BMI is greater than 25 and therefore counseled on weight management options.  O2 Flow:  Room air CC: Pt c/o productive cough with pale yellow mucus x 3 days.  Pt scheduled for AV node ablation August 31 Comments Medications reviewed with patient Verified contact number and pharmacy with patient Zackery Barefoot CMA  October 25, 2009 2:36 PM    Physical Exam  Additional Exam:  Gen: Pleasant, well nourished, in no distress, on supp oxygen ENT: no lesions,  no postnasal drip Neck: No JVD, no TMG, no carotid bruits Lungs: no use of accessory muscles, no dullness to percussion, distant BS, poor airflow Cardiovascular: RRR, heart sounds normal, no murmurs or gallops, 1+  peripheral edema Musculoskeletal: No deformities, no cyanosis or clubbing  ABD:  increased ascites     Impression & Recommendations:  Problem # 1:  NICOTINE ADDICTION (ICD-305.1) Assessment Unchanged ongoing nicotine addiciton the pt has failed all smoking cessation measures Orders: Est. Patient Level III (29562)  Problem # 2:  COPD (ICD-496) Assessment: Unchanged  copd no changes, still ongoing tobacco abuse  plan cont inhaled meds the pt is cleared for planned av nodal ablation attempt  Medications Added to Medication List This Visit: 1)  Nasonex 50 Mcg/act Susp (Mometasone furoate) .... Two puff ea nostril once daily at bedtime 2)  Nexium 40 Mg Cpdr (Esomeprazole magnesium) .... Take one half hour before eating. take one tablet by mouth every other day 3)  Cardizem 120 Mg Tabs (Diltiazem hcl) .... Take 1 tablet by mouth every morning and take 2 tab by mouth at bedtime 4)  Magnesium  .Marland KitchenMarland Kitchen. 1 by mouth daily  Complete Medication List: 1)  Potassium Chloride Crys Cr 20 Meq Cr-tabs  (Potassium chloride crys cr) .... 2 every am and 2 every pm 2)  Advair Diskus 250-50 Mcg/dose Misc (Fluticasone-salmeterol) .... One puff two times a day 3)  Spiriva Handihaler 18 Mcg Caps (Tiotropium bromide monohydrate) .... One capsule once daily 4)  Nasonex 50 Mcg/act Susp (Mometasone furoate) .... Two puff ea nostril once daily at bedtime 5)  Pletal 100 Mg Tabs (Cilostazol) .... Take 1 tablet by mouth once a day 6)  Nexium 40 Mg Cpdr (Esomeprazole magnesium) .... Take one half hour before eating. take one tablet by mouth every other day 7)  Mucinex 600 Mg Xr12h-tab (Guaifenesin) .... One by mouth two times a day 8)  Lipitor 20 Mg Tabs (Atorvastatin calcium) .... Once daily 9)  Glucosamine 1500 Complex Caps (Glucosamine-chondroit-vit c-mn) .... Two times a day 10)  Cardizem 120 Mg Tabs (Diltiazem hcl) .... Take 1 tablet by mouth every morning and take 2 tab by mouth at bedtime 11)  Fe-caps 250 Mg Cr-caps (Ferrous sulfate) .... One daily 12)  Xopenex Hfa 45 Mcg/act Aero (Levalbuterol tartrate) .... 2 puffs every 6 hours as needed 13)  Furosemide 40 Mg Tabs (Furosemide) .... Take 2  tab two times a day 14)  Spironolactone 25 Mg Tabs (Spironolactone) .... Take one tablet once daily 15)  Bayer Low Strength 81 Mg Tbec (Aspirin) .Marland Kitchen.. 1 by mouth daily 16)  Magnesium  .Marland KitchenMarland Kitchen. 1 by mouth daily 17)  Glipizide 5 Mg Tabs (Glipizide) .Marland Kitchen.. 1 1/2 once daily 18)  Fish Oil 1000 Mg Caps (Omega-3 fatty acids) .... Take 1 capsule by mouth two times a day 19)  Warfarin Sodium 5 Mg Tabs (Warfarin sodium) .... Use as directed by anticoagulation clinic  Patient Instructions: 1)  No change in medications 2)  Return in      4    months Prescriptions: NASONEX 50 MCG/ACT  SUSP (MOMETASONE FUROATE) two puff ea nostril once daily at bedtime  #1 x 6   Entered and Authorized by:   Storm Frisk MD   Signed by:   Storm Frisk MD on 10/25/2009   Method used:   [facility]ron[facility] to        [facility]  [address]  [phone]* (retail)       [address]  [place], [place]  [address]       Ph: [phone] or [phone]       Fax: [phone]   RxID:   [id number]    Appended Document: Pulmonary OV fax ron polite

## 2010-04-10 NOTE — Progress Notes (Signed)
Summary: refill- spiriva  Phone Note Call from Patient Call back at Home Phone [phone]   [name]: Patient Call For: wright Summary of Call: pt requests refill for spiriva- 90 day supply- [facility]. she only has 10 days remaining on this. (pt already called pharm and was told to call here- she has a f/u in nov w/ pw) Initial call taken by: Tivis Ringer, CNA,  December 03, 2009 12:19 PM  Follow-up for Phone Call        Spiriva Rx last sent to [facility] on 4.25.11 #3 x4.    Called [facility], spoke with Dior.  Per Dior, pt still has 2 refills left on spiriva.  Rx was mailed on 9.19.11 and delivered via UPS on 9.21.11 and left at pt's front door.  Per Dior, if pt has questions regarding this she can call them back.  Called, spoke with pt.  She was informed of above and stated she did not receive the shipment.  She will call [facility] back to discuss this with them.  If samples are needed before a shipment arrives, she will call office back.  Follow-up by: Gweneth Dimitri RN,  December 03, 2009 12:31 PM

## 2010-04-10 NOTE — Assessment & Plan Note (Signed)
Summary: rov/pc2/need ekg--s/p tee cardioversion/lwb   Primary Provider:  Dr [name] . Polite   History of Present Illness: [name] is seen in followup for atrial fibrillation following pulmonary vein isolation twice, first at [facility] and then at [name] in 12/10.  She has recurrent atrial flutter which was cardioverted two weeks ago and as reverted to atrial flutter with a RVR  She is asymptomatic  She is s/p pacer for bradycardia.    She has COPD-which is problematic. She continues to smoke  Current Medications (verified): 1)  Potassium Chloride Crys Cr 20 Meq Cr-Tabs (Potassium Chloride Crys Cr) .... 2 Every Am and 4 Every Pm 2)  Advair Diskus 250-50 Mcg/dose  Misc (Fluticasone-Salmeterol) .... One Puff Two Times A Day 3)  Spiriva Handihaler 18 Mcg  Caps (Tiotropium Bromide Monohydrate) .... One Capsule Once Daily 4)  Nasonex 50 Mcg/act  Susp (Mometasone Furoate) .... Two Puff Ea Nostril Once Daily As Needed 5)  Pletal 100 Mg  Tabs (Cilostazol) .... Take 1 Tablet By Mouth Once A Day 6)  Nexium 40 Mg  Cpdr (Esomeprazole Magnesium) .... By Mouth Daily. Take One Half Hour Before Eating. 7)  Mucinex 600 Mg  Xr12h-Tab (Guaifenesin) .... One By Mouth Two Times A Day 8)  Lipitor 20 Mg  Tabs (Atorvastatin Calcium) .... Once Daily 9)  Glucosamine 1500 Complex   Caps (Glucosamine-Chondroit-Vit C-Mn) .... Two Times A Day 10)  Cardizem 120 Mg Tabs (Diltiazem Hcl) .Marland Kitchen.. 1 Tab in Am 2 Tabs in Pm 11)  Fe-Caps 250 Mg Cr-Caps (Ferrous Sulfate) .... One Daily 12)  Xopenex Hfa 45 Mcg/act Aero (Levalbuterol Tartrate) .... 2 Puffs Every 6 Hours As Needed 13)  Furosemide 40 Mg Tabs (Furosemide) .... Take 2  Tab Two Times A Day 14)  Spironolactone 25 Mg Tabs (Spironolactone) .... Take One Tablet Once Daily 15)  Bayer Low Strength 81 Mg Tbec (Aspirin) .Marland Kitchen.. 1 By Mouth Daily 16)  Magnesium .Marland Kitchen.. 1 By Mouth Daiky 17)  Glipizide 5 Mg Tabs (Glipizide) .Marland Kitchen.. 1 1/2 Once Daily 18)  Fish Oil 1000 Mg Caps (Omega-3 Fatty  Acids) .... Take 1 Capsule By Mouth Two Times A Day 19)  Warfarin Sodium 5 Mg Tabs (Warfarin Sodium) .... Use As Directed By Anticoagulation Clinic 20)  Tikosyn 250 Mcg Caps (Dofetilide) .... Take One Capsule Every 12 Hours  Allergies (verified): No Known Drug Allergies  Past History:  Past Medical History: Last updated: 08/20/2009 HYPERSOMNIA, ASSOCIATED WITH SLEEP APNEA (ICD-780.53) Amiodarone Lung Toxicity RENAL DISEASE, CHRONIC  Creatinine 1.11 January 2009 CVA (ICD-434.91) DM (ICD-250.00) cigarette abuse  DIASTOLIC HEART FAILURE, ACUTE ON CHRONIC (ICD-428.33) PACEMAKER, PERMANENT   ATRIAL FIBRILLATION (ICD-427.31)       -AV ablation 9/09 [facility] per Dr Sampson Goon  HYPERLIPIDEMIA (ICD-272.4) GERD (ICD-530.81) CORONARY ARTERY DISEASE not sure of htis 11/10 COPD (ICD-496)     -FeV1 73%  DLCO 53% 5/09 PAD with hx right iliac/SFA stenting and left leg PTA  Vital Signs:  Patient profile:   77 year old female Height:      65.5 inches Weight:      183 pounds BMI:     30.10 Pulse rate:   126 / minute Resp:     18 per minute BP sitting:   88 / 66  (left arm)  Vitals Entered By: Marrion Coy, CNA (September 05, 2009 12:11 PM)  Physical Exam  General:  The patient was alert and oriented in no acute distress.Neck veins were flat, carotids were brisk. Lungs were clear. Heart  sounds were regiular and rapid without murmurs or gallops. Abdomen was soft with active bowel sounds. There is no clubbing cyanosis; trace edema.    EKG  Procedure date:  09/05/2009  Findings:      aflutter (atypical) 2:1 with upright flutter waves in V1 and inferior leads Atrial cycle length  250 msec  PPM Specifications Following MD:  Sherryl Manges, MD     PPM Vendor:  [employer]     PPM Model Number:  [id number] [id number]     PPM Serial Number:  [id number] PPM DOI:  01/23/2004     PPM Implanting MD:  Sherryl Manges, MD  Lead 1    Location: [facility]     DOI: 01/23/2004     Model #: 1642     Serial #: [id number]     Status:  active Lead 2    Location: [facility]     DOI: 01/23/2004     Model #: 1646T     Serial #: [id number]     Status: active  Magnet Response Rate:  BOL 98.6  Indications:  TACHY BRADY SYNDROME   PPM Follow Up Pacer Dependent:  No      Episodes Coumadin:  Yes  Parameters Mode:  DDIR     Lower Rate Limit:  70     Upper Rate Limit:  90 Paced AV Delay:  275     Sensed AV Delay:  250  Impression & Recommendations:  Problem # 1:  ATRIAL FIBRILLATION (ICD-427.31) Pt has recurrent atrial flutter with RVR  will work on slowing it down and she is to see Dr Macon Large at [facility] to consider repeat AFib/flutter ablatoin or AV ablation.  We discussed these at some length today Add diltiazem Her updated medication list for this problem includes:    Pletal 100 Mg Tabs (Cilostazol) .Marland Kitchen... Take 1 tablet by mouth once a day    Bayer Low Strength 81 Mg Tbec (Aspirin) .Marland Kitchen... 1 by mouth daily    Warfarin Sodium 5 Mg Tabs (Warfarin sodium) ..... Use as directed by anticoagulation clinic    Tikosyn 250 Mcg Caps (Dofetilide) .Marland Kitchen... Take one capsule every 12 hours  Problem # 2:  CORONARY ARTERY DISEASE (ICD-414.00) stabl;e Her updated medication list for this problem includes:    Pletal 100 Mg Tabs (Cilostazol) .Marland Kitchen... Take 1 tablet by mouth once a day    Cardizem 120 Mg Tabs (Diltiazem hcl) .Marland Kitchen... 1 tab in am 2 tabs in pm    Bayer Low Strength 81 Mg Tbec (Aspirin) .Marland Kitchen... 1 by mouth daily    Warfarin Sodium 5 Mg Tabs (Warfarin sodium) ..... Use as directed by anticoagulation clinic  Problem # 3:  OBSTRUCTIVE SLEEP APNEA (ICD-327.23) treated  Appended Document: [facility]     Allergies: No Known Drug Allergies   PPM Specifications Following MD:  Sherryl Manges, MD     PPM Vendor:  [employer]     PPM Model Number:  [id number]     PPM Serial Number:  [id number] PPM DOI:  01/23/2004     PPM Implanting MD:  Sherryl Manges, MD  Lead 1    Location: [facility]     DOI: 01/23/2004     Model #: 1642     Serial #: [id number]     Status:  active Lead 2    Location: [facility]     DOI: 01/23/2004     Model #: 1646T     Serial #: [id number]     Status: active  Magnet  Response Rate:  BOL 98.6  Indications:  TACHY BRADY SYNDROME   PPM Follow Up Pacer Dependent:  No      Episodes Coumadin:  Yes  Parameters Mode:  DDIR     Lower Rate Limit:  70     Upper Rate Limit:  90 Paced AV Delay:  275     Sensed AV Delay:  250  Impression & Recommendations:  Problem # 1:  HYPOTENSION, UNSPECIFIED (ICD-458.9) I wonder she has vascular disease causing spurious hypotension-- we will need to check renal function and if adequate will be gin dig;  also would undertake eval of upper extremities to see if subclavian stenosis  Appended Document: rov/pc2/need ekg--s/p tee cardioversion/lwb Called [name] left message for her to call to schedule  bmp

## 2010-04-10 NOTE — Assessment & Plan Note (Signed)
Summary: Pulmonary OV   Primary Provider/Referring Provider:  Dr [name] . Polite  CC:  Follow up for surgery clearance.  Pending left breast lumpectomy by Dr. Abbey Chatters. c/o sneezing, runny nose, dry cough, and increased SOB x 4 days.  Currently smoking < 1/2 ppd..  History of Present Illness:   77 year old, white female with history of chronic obstructive lung disease, asthmatic bronchitis, and obstructive sleep apnea.   Active smoker and has failed smoking cessation on multiple attempts.   February 12, 2009 10:48 AM To see EP MD at [facility].  Having more PAF.  For a potential ablation.  Had prior ablation at [facility]. Recently cough ok. Had diuretics increased 10/10.  Still on same doses. Had a stress test but not able to complete due to hypotension.  Stopped tikosyn and on [place] and it makes pt nauseated.  The weight is up.  The pt notes less edema  April 22, 2009 2:17 PM Had lots of salt over the past 04/19/09 weekend so is up on the weight.  The pt is not having much cough now.  The pt is  still dyspneic with exertion.  There is no chest pain.  The pt did not undergo DCCV.  She is on Tikosyn as of now for Afib.   The Multaq was stopped.  She did not get a repeat ablation at [facility].  July 01, 2009 12:09 PM Cough ok,  dyspnea ok,  worse when humid and rainy.  Feels ok.  Smoking still. Pt denies any significant sore throat, nasal congestion or excess secretions, fever, chills, sweats, unintended weight loss, pleurtic or exertional chest pain, orthopnea PND, or leg swelling   October 25, 2009 2:51 PM Pt is to have AV node ablation on 8/31.  Bad cough one day ago.  Non productive with the cough.  Dyspnea is about the same.  Notes sl wheezing.    [facility]. No other new issues.  The pt still smokes 1PPD!! December 31, 2009 3:46 PM L breast CA  newly dx.  [name] to operate.    Pt needs a lumpectomy.  found with mammogram. Now: has been outside in the yard working.   Now coughing 4days ago.  Eyes running now.  Nose runs.  Sneezes but no itching.   Dyspnea not as good.   Mucus is clear   Surgery is pending.    Preventive Screening-Counseling & Management  Alcohol-Tobacco     Smoking Status: current     Packs/Day: 0.25  Current Medications (verified): 1)  Potassium Chloride Crys Cr 20 Meq Cr-Tabs (Potassium Chloride Crys Cr) .... 2 Every Am and 3 Every Pm 2)  Advair Diskus 250-50 Mcg/dose  Misc (Fluticasone-Salmeterol) .... One Puff Two Times A Day 3)  Spiriva Handihaler 18 Mcg  Caps (Tiotropium Bromide Monohydrate) .... One Capsule Once Daily 4)  Fluticasone Propionate 50 Mcg/act Susp (Fluticasone Propionate) .... 2 Sprays Each Nostril At Bedtime 5)  Pletal 100 Mg  Tabs (Cilostazol) .... Take 1 Tablet By Mouth Two Times A Day 6)  Nexium 40 Mg  Cpdr (Esomeprazole Magnesium) .... Take One Half Hour Before Eating. Take One Tablet By Mouth Every Other Day 7)  Mucinex 600 Mg  Xr12h-Tab (Guaifenesin) .... One By Mouth Two Times A Day 8)  Lipitor 20 Mg  Tabs (Atorvastatin Calcium) .... Once Daily 9)  Glucosamine 1500 Complex   Caps (Glucosamine-Chondroit-Vit C-Mn) .... Two Times A Day 10)  Fe-Caps 250 Mg Cr-Caps (Ferrous Sulfate) .Marland KitchenMarland KitchenMarland Kitchen  One Daily 11)  Xopenex Hfa 45 Mcg/act Aero (Levalbuterol Tartrate) .... 2 Puffs Every 6 Hours As Needed 12)  Furosemide 40 Mg Tabs (Furosemide) .... Take 2  Tab Two Times A Day 13)  Bayer Low Strength 81 Mg Tbec (Aspirin) .Marland Kitchen.. 1 By Mouth Daily 14)  Magnesium .Marland Kitchen.. 1 By Mouth Daily 15)  Glipizide 5 Mg Tabs (Glipizide) .Marland Kitchen.. 1 1/2 Once Daily 16)  Fish Oil 1000 Mg Caps (Omega-3 Fatty Acids) .... Take 1 Capsule By Mouth Two Times A Day 17)  Warfarin Sodium 5 Mg Tabs (Warfarin Sodium) .... Use As Directed By Anticoagulation Clinic  Allergies (verified): No Known Drug Allergies  Past History:  Past medical, surgical, family and social histories (including risk factors) reviewed, and no changes noted (except as noted  below).  Past Medical History: Reviewed history from 12/30/2009 and no changes required. HYPERSOMNIA, ASSOCIATED WITH SLEEP APNEA (ICD-780.53) Amiodarone Lung Toxicity RENAL DISEASE, CHRONIC  Creatinine 1.11 January 2009 CVA (ICD-434.91) DM (ICD-250.00) cigarette abuse  DIASTOLIC HEART FAILURE, ACUTE ON CHRONIC (ICD-428.33) PACEMAKER, PERMANENT   ATRIAL FIBRILLATION (ICD-427.31)       -AV ablation 9/09 [facility] per Dr Sampson Goon       - AV node ablation 09/11 Dr. Joseph Berkshire (ICD-272.4) GERD (ICD-530.81) CORONARY ARTERY DISEASE not sure of htis 11/10 COPD (ICD-496)     -FeV1 73%  DLCO 53% 5/09 PAD with hx right iliac/SFA stenting and left leg PTA Invasive ductal carcimoma-left breast-2011  Past Surgical History: Reviewed history from 03/12/2009 and no changes required. Pulmonary vein isolation x2 once at [facility] and once at [facility]  Past Pulmonary History:  Pulmonary History: COPD     -FeV1 73%  DLCO 53% 5/09  Family History: Reviewed history from 06/01/2008 and no changes required. That mother died of unknown cause.  Father died   with heart disease.  The patient has two sisters who are alive and well.   No brothers.   Social History: Reviewed history from 10/25/2009 and no changes required. 1/2 cup decaff  Tai-chi & water aerobics - garden, active lifestyle Retired  Married  Tobacco Use - Yes.  Alcohol Use - yes Patient is a current smoker.  Packs/Day:  0.25  Review of Systems       The patient complains of shortness of breath with activity, shortness of breath at rest, productive cough, non-productive cough, nasal congestion/difficulty breathing through nose, and sneezing.  The patient denies coughing up blood, chest pain, irregular heartbeats, acid heartburn, indigestion, loss of appetite, weight change, abdominal pain, difficulty swallowing, sore throat, tooth/dental problems, headaches, itching, ear ache, anxiety, depression, hand/feet swelling, joint  stiffness or pain, rash, change in color of mucus, and fever.    Vital Signs:  Patient profile:   77 year old female Height:      65.5 inches Weight:      185.50 pounds BMI:     30.51 O2 Sat:      97 % on Room air Temp:     98.1 degrees F oral Pulse rate:   70 / minute BP sitting:   112 / 72  (left arm) Cuff size:   regular  Vitals Entered By: Gweneth Dimitri RN (December 31, 2009 3:35 PM)  O2 Flow:  Room air CC: Follow up for surgery clearance.  Pending left breast lumpectomy by Dr. Abbey Chatters. c/o sneezing, runny nose, dry cough, increased SOB x 4 days.  Currently smoking < 1/2 ppd. Comments Medications reviewed with patient Daytime contact number verified with patient.  Gweneth Dimitri RN  December 31, 2009 3:36 PM    Physical Exam  Additional Exam:  Gen: Pleasant, well nourished, in no distress, on supp oxygen ENT: no lesions, no postnasal drip Neck: No JVD, no TMG, no carotid bruits Lungs: no use of accessory muscles, no dullness to percussion, distant BS, poor airflow Cardiovascular: RRR, heart sounds normal, no murmurs or gallops, 1+  peripheral edema Musculoskeletal: No deformities, no cyanosis or clubbing  ABD:  increased ascites     Impression & Recommendations:  Problem # 1:  COPD (ICD-496) Assessment Deteriorated  copd with acute tracheobronchitis flare, still ongoing tobacco abuse and atopic features with allergic rhinitis flare  plan cont inhaled meds this pt cannot yet undergo lumpectomy until airway is less inflammed , she needs to stop smoking but I doubt that will occur.  Nasonex two sprays each nostril daily until samples gone then resume fluticasone Prednisone 10mg  4 each am x3days, 3 x 3days, 2 x 3days, 1 x 3days then stop Clarinex one daily until samples gone No other medication changes Return 2-3 weeks for surgical clearance  Medications Added to Medication List This Visit: 1)  Fluticasone Propionate 50 Mcg/act Susp (Fluticasone propionate) ....  Hold until samples of nasonex run out then resume 2)  Nasonex 50 Mcg/act Susp (Mometasone furoate) .... Two puffs each nostril daily use until samples gone 3)  Prednisone 10 Mg Tabs (Prednisone) .... Take as directed 4 each am x3days, 3 x 3days, 2 x 3days, 1 x 3days then stop 4)  Clarinex 5 Mg Tabs (Desloratadine) .... One tablet by mouth daily use until samples gone  Complete Medication List: 1)  Potassium Chloride Crys Cr 20 Meq Cr-tabs (Potassium chloride crys cr) .... 2 every am and 3 every pm 2)  Advair Diskus 250-50 Mcg/dose Misc (Fluticasone-salmeterol) .... One puff two times a day 3)  Spiriva Handihaler 18 Mcg Caps (Tiotropium bromide monohydrate) .... One capsule once daily 4)  Fluticasone Propionate 50 Mcg/act Susp (Fluticasone propionate) .... Hold until samples of nasonex run out then resume 5)  Pletal 100 Mg Tabs (Cilostazol) .... Take 1 tablet by mouth two times a day 6)  Nexium 40 Mg Cpdr (Esomeprazole magnesium) .... Take one half hour before eating. take one tablet by mouth every other day 7)  Mucinex 600 Mg Xr12h-tab (Guaifenesin) .... One by mouth two times a day 8)  Lipitor 20 Mg Tabs (Atorvastatin calcium) .... Once daily 9)  Glucosamine 1500 Complex Caps (Glucosamine-chondroit-vit c-mn) .... Two times a day 10)  Fe-caps 250 Mg Cr-caps (Ferrous sulfate) .... One daily 11)  Xopenex Hfa 45 Mcg/act Aero (Levalbuterol tartrate) .... 2 puffs every 6 hours as needed 12)  Furosemide 40 Mg Tabs (Furosemide) .... Take 2  tab two times a day 13)  Bayer Low Strength 81 Mg Tbec (Aspirin) .Marland Kitchen.. 1 by mouth daily 14)  Magnesium  .Marland KitchenMarland Kitchen. 1 by mouth daily 15)  Glipizide 5 Mg Tabs (Glipizide) .Marland Kitchen.. 1 1/2 once daily 16)  Fish Oil 1000 Mg Caps (Omega-3 fatty acids) .... Take 1 capsule by mouth two times a day 17)  Warfarin Sodium 5 Mg Tabs (Warfarin sodium) .... Use as directed by anticoagulation clinic 18)  Nasonex 50 Mcg/act Susp (Mometasone furoate) .... Two puffs each nostril daily use until  samples gone 19)  Prednisone 10 Mg Tabs (Prednisone) .... Take as directed 4 each am x3days, 3 x 3days, 2 x 3days, 1 x 3days then stop 20)  Clarinex 5 Mg Tabs (Desloratadine) .... One tablet  by mouth daily use until samples gone  Other Orders: Est. Patient Level IV (04540)  Patient Instructions: 1)  Nasonex two sprays each nostril daily until samples gone then resume fluticasone 2)  Prednisone 10mg  4 each am x3days, 3 x 3days, 2 x 3days, 1 x 3days then stop 3)  Clarinex one daily until samples gone 4)  No other medication changes 5)  Return 2-3 weeks Prescriptions: PREDNISONE 10 MG  TABS (PREDNISONE) Take as directed 4 each am x3days, 3 x 3days, 2 x 3days, 1 x 3days then stop  #30 x 0   Entered and Authorized by:   Storm Frisk MD   Signed by:   Storm Frisk MD on 12/31/2009   Method used:   Electronically to        [facility] [address] [phone]* (retail)       [address]       [place], [place]  [address]       Ph: [phone] or [phone]       Fax: [phone]   RxID:   [id number]    Immunization History:  Influenza Immunization History:    Influenza:  historical (12/07/2009)   Appended Document: Pulmonary OV fax ron polite and todd Abbey Chatters

## 2010-04-10 NOTE — Progress Notes (Signed)
Summary: NEED INFORMATION  FOR PROCEDURE LOST  PAPER WORK.  Phone Note Call from Patient Call back at [facility] Phone [phone]   [name]: Patient Summary of Call: PT HAS LOST ALL HER PAPER WORK WITH THE INFORMATION ABOUT HER PROCEDURE 04/04/08 Initial call taken by: Judie Grieve,  April 03, 2009 2:30 PM  Follow-up for Phone Call        Pt aware of instructions Follow-up by: Duncan Dull, RN, BSN,  April 03, 2009 3:40 PM

## 2010-04-10 NOTE — Progress Notes (Signed)
Summary: **AW INR 1/24**  Phone Note Call from Patient Call back at Home Phone [phone]   [name]: Patient Reason for Call: Talk to Nurse Details for Reason: checking on status of date for procedure. should pt let [facility] know she not coming to appt.  Initial call taken by: Lorne Skeens,  March 15, 2009 12:49 PM  Follow-up for Phone Call        S/W Dr. Graciela Husbands about the 2 sub therapeutic INR's on 12/20 and 12/23. If her AF started after 12/23, we can proceed with DCCV. If it was before we will need to readdress whether to proceed with TEE or wait for 4 consecutive INR's. Pt aware of plan. Pt scheduled to come in on Monday for a device check to see if we can tell when she went into AF Gunnar Fusi aware of circumstances). She will also have an INR check on that day. Follow-up by: Duncan Dull, RN, BSN,  March 15, 2009 2:42 PM  Additional Follow-up for Phone Call Additional follow up Details #1::        Patient's device is programmed DDIR because with this device, there are no histagrams available during mode switch.  So we programmed her DDIR to evaluate heart rates during atrial arrythmias.  Because of that, I do not have a log of arrythmias.  Appt cancelled for today.  Will forward to Melanie/Dr Graciela Husbands to decide plan. Gypsy Balsam RN BSN  March 18, 2009 8:22 AM     Additional Follow-up for Phone Call Additional follow up Details #2::    Called to s/w pt about plan for Cardioversion. Unable to reach her and no VM. We will plan for 4 weeks for now, until I s/w Dr. Graciela Husbands on Wed to see if he has other plans. Will call her back on Wed.  Duncan Dull, RN, BSN  March 25, 2009 12:20 PM   Additional Follow-up for Phone Call Additional follow up Details #3:: Details for Additional Follow-up Action Taken: Per Dr. Graciela Husbands, pt will need set up for TEE/ Cardioversion since her INR's are sub therapeutic. I will call her on Friday when I return to set up. Duncan Dull, RN, BSN  March 27, 2009 6:58 PM   S/w pt and the plan is to see what her INR is on Monday and go from there. Per Dr. Graciela Husbands, she should be set up for a TEE/ Cardioversion with Dr. Shirlee Latch, Dr. Kittie Plater or Dr. Jens Som. Duncan Dull, RN, BSN  March 29, 2009 3:41 PM  Pt aware of plan. I will call her with instructions tomorrow.  Pt aware of instructions.  Additional Follow-up by: Duncan Dull, RN, BSN,  April 01, 2009 5:11 PM  `

## 2010-04-10 NOTE — Medication Information (Signed)
Summary: rov/tm  Anticoagulant Therapy  Managed by: Jeralene Peters, PharmD Referring MD: Sherryl Manges MD PCP: Dr [name] . Polite Supervising MD: Jens Som MD, Arlys John Indication 1: Atrial Fibrillation (ICD-427.31) Lab Used: [facility] Site: [facility] INR POC 1.8 INR RANGE 2.0-3.0  Dietary changes: no    Health status changes: no    Bleeding/hemorrhagic complications: no    Recent/future hospitalizations: yes       Details: MD WANTS TO SCHEDULE CARDIOVERSION ONCE INR IS THERAPEUTIC X 3  Any changes in medication regimen? no    Recent/future dental: no  Any missed doses?: no       Is patient compliant with meds? yes      Comments: MAY HAVE MISSED 1 DOSE BUT UNSURE  Current Medications (verified): 1)  Klor-Con 20 Meq  Pack (Potassium Chloride) .... Take 1 Tablet in The Morning and 2at Night 2)  Advair Diskus 250-50 Mcg/dose  Misc (Fluticasone-Salmeterol) .... One Puff Two Times A Day 3)  Spiriva Handihaler 18 Mcg  Caps (Tiotropium Bromide Monohydrate) .... One Capsule Once Daily 4)  Nasonex 50 Mcg/act  Susp (Mometasone Furoate) .... Two Puff Ea Nostril Once Daily 5)  Pletal 100 Mg  Tabs (Cilostazol) .... Take 1 Tablet By Mouth Twice Daily 6)  Nexium 40 Mg  Cpdr (Esomeprazole Magnesium) .... By Mouth Daily. Take One Half Hour Before Eating. 7)  Mucinex 600 Mg  Xr12h-Tab (Guaifenesin) .... Two By Mouth Two Times A Day 8)  Lipitor 20 Mg  Tabs (Atorvastatin Calcium) .... Once Daily 9)  Glucosamine 1500 Complex   Caps (Glucosamine-Chondroit-Vit C-Mn) .... Two Times A Day 10)  Cardizem 120 Mg Tabs (Diltiazem Hcl) .Marland Kitchen.. 1 Tab in Am 2 Tabs in Pm 11)  Fe-Caps 250 Mg Cr-Caps (Ferrous Sulfate) .... One Daily 12)  Xopenex Hfa 45 Mcg/act Aero (Levalbuterol Tartrate) .... 2 Puffs Every 6 Hours As Needed 13)  Furosemide 40 Mg Tabs (Furosemide) .... Take 2  Tab Two Times A Day 14)  Spironolactone 25 Mg Tabs (Spironolactone) .... Take One Tablet Once Daily 15)  Bayer Low Strength 81 Mg Tbec  (Aspirin) .Marland Kitchen.. 1 By Mouth Daily 16)  Magnesium .Marland Kitchen.. 1 By Mouth Daiky 17)  Oxygen .... 2l At Bedtime 18)  Glipizide 5 Mg Tabs (Glipizide) .Marland Kitchen.. 1 1/2 Once Daily 19)  Fish Oil 1000 Mg Caps (Omega-3 Fatty Acids) .... Take 1 Capsule By Mouth Two Times A Day 20)  Warfarin Sodium 5 Mg Tabs (Warfarin Sodium) .... Use As Directed By Anticoagulation Clinic 21)  Tikosyn 250 Mcg Caps (Dofetilide) .... Take One Capsule Every 12 Hours  Allergies (verified): No Known Drug Allergies  Anticoagulation Management History:      The patient is taking warfarin and comes in today for a routine follow up visit.  Positive risk factors for bleeding include an age of 49 years or older, history of CVA/TIA, and presence of serious comorbidities.  The bleeding index is 'high risk'.  Positive CHADS2 values include History of CHF, Age > 42 years old, History of Diabetes, and Prior Stroke/CVA/TIA.  The start date was 01/20/2004.  Her last INR was 2.2 RATIO.  Anticoagulation responsible provider: Jens Som MD, Arlys John.  INR POC: 1.8.  Exp: 06/2010.    Anticoagulation Management Assessment/Plan:      The patient's current anticoagulation dose is Warfarin sodium 5 mg tabs: Use as directed by Anticoagulation Clinic.  The target INR is 2.0-3.0.  The next INR is due 04/01/2009.  Anticoagulation instructions were given to patient.  Results were  reviewed/authorized by Jeralene Peters, PharmD.         Prior Anticoagulation Instructions: INR 2.6 Discontinue Lovenox Continue 7.5mg s daily except 5mg s on Tuesdays, Thursdays and Saturdays.   Current Anticoagulation Instructions: INR 1.8  TAKE 2 TABLETS TONIGHT (10MG ) THEN CONTINUE TAKING 1.5 TABLETS (7.5MG ) DAILY EXCEPT TAKE 1 TABLET (5MG ). RECHECK IN 1 WEEK.

## 2010-04-10 NOTE — Letter (Signed)
Summary: [facility]  [facility]   Imported By: Lennie Odor 03/07/2010 12:19:31  _____________________________________________________________________  External Attachment:    Type:   Image     Comment:   External Document

## 2010-04-10 NOTE — Progress Notes (Signed)
Summary: pt has not heard anything regarding her question/LM  Phone Note Call from Patient Call back at Home Phone [phone]   [name]: Patient Reason for Call: Talk to Nurse, Talk to Doctor Summary of Call: pt has not heard back regarding who is the first person she saw when her irregular heart beat started.  Initial call taken by: Omer Jack,  April 22, 2009 1:16 PM  Follow-up for Phone Call        [facility] Ollen Gross, RN, BSN  April 22, 2009 1:47 PM.  Called pt. phone bussy. Ollen Gross, RN, BSN  April 22, 2009 4:55 PM Dr Graciela Husbands spoke with pt's husband last night the only thing he knows is he was asked by pt's daughter in law to see her.   She was a pt of Dr Alanda Amass at [facility] at that point Dennis Bast, RN, BSN  April 23, 2009 9:39 AM

## 2010-04-10 NOTE — Progress Notes (Signed)
Summary: dr Abbey Chatters re: anesthesia for pt  Phone Note From Other Clinic   [name]: dr Abbey Chatters Call For: wright Summary of Call: dr Abbey Chatters requests to speak to dr Delford Field re: surgery clearance for pt. dr R understand that dr Delford Field is off today and tomorrow. he asks that pw call his pager when he is back in office or hosp. [phone] Initial call taken by: Tivis Ringer, CNA-01/06/2010  Follow-up for Phone Call        noted by triage.  will forward to PEW. Boone Master CNA/MA  January 06, 2010 2:35 PM   Additional Follow-up for Phone Call Additional follow up Details #1::        I will cal him today Additional Follow-up by: Storm Frisk MD,  January 07, 2010 7:07 AM

## 2010-04-10 NOTE — Progress Notes (Signed)
Summary: refills-TRY LATER  Phone Note Call from Patient Call back at Home Phone [phone]   [name]: Patient Call For: wright Reason for Call: Refill Medication, Talk to Nurse Summary of Call: Patient switched insurance plans and needs new refills called to [facility] # [phone]; patient's member # [id number].  Meds needed spiriva, advair 250/50, and nasonex. Initial call taken by: Lehman Prom,  March 13, 2010 11:38 AM  Follow-up for Phone Call        I called and spoke with pt to verify this msg- she is needing 90 day supply called in for nasonex, advair, and sprivia.  I called the number given and was given number to call [facility]- [phone].  I called this number and was transferredto several different people and ultimalely was given another number to call in which I was told that the pt is not in there system.  I called the original number and was again told to call [facility].  Will try this again later Follow-up by: Vernie Murders,  March 13, 2010 2:49 PM  Additional Follow-up for Phone Call Additional follow up Details #1::        Done- rxs were [facility]ed to [facility]. Additional Follow-up by: Vernie Murders,  March 13, 2010 3:47 PM    New/Updated Medications: NASONEX 50 MCG/ACT SUSP (MOMETASONE FUROATE) 2 sprays each nostril once daily Prescriptions: NASONEX 50 MCG/ACT SUSP (MOMETASONE FUROATE) 2 sprays each nostril once daily  #3 x 3   Entered by:   Vernie Murders   Authorized by:   Storm Frisk MD   Signed by:   Vernie Murders on 03/13/2010   Method used:   Faxed to ...       [facility] (mail-order)             , [place]         Ph: [phone]       Fax: [phone]   RxID:   [id number] SPIRIVA HANDIHALER 18 MCG  CAPS (TIOTROPIUM BROMIDE MONOHYDRATE) one capsule once daily  #3 x 3   Entered by:   Vernie Murders   Authorized by:   Storm Frisk MD   Signed by:   Vernie Murders on 03/13/2010   Method used:   Faxed to ...   [facility] (mail-order)             , [place]         Ph: [phone]       Fax: [phone]   RxID:   [id number] ADVAIR DISKUS 250-50 MCG/DOSE  MISC (FLUTICASONE-SALMETEROL) one puff two times a day  #3 x 3   Entered by:   Vernie Murders   Authorized by:   Storm Frisk MD   Signed by:   Vernie Murders on 03/13/2010   Method used:   Faxed to ...       [facility] (mail-order)             , [place]         Ph: [phone]       Fax: [phone]   RxID:   [id number] NASONEX 50 MCG/ACT SUSP (MOMETASONE FUROATE) 2 sprays each nostril once daily  #3 x 3   Entered by:   Vernie Murders   Authorized by:   Storm Frisk MD   Signed by:   Vernie Murders on 03/13/2010   Method used:   Historical   RxID:   [id number] SPIRIVA HANDIHALER 18 MCG  CAPS (TIOTROPIUM BROMIDE MONOHYDRATE) one capsule once daily  #3 x 3   Entered by:   Vernie Murders   Authorized by:   Storm Frisk MD   Signed by:   Vernie Murders on 03/13/2010   Method used:   Historical   RxID:   [id number] ADVAIR DISKUS 250-50 MCG/DOSE  MISC (FLUTICASONE-SALMETEROL) one puff two times a day  #3 x 3   Entered by:   Vernie Murders   Authorized by:   Storm Frisk MD   Signed by:   Vernie Murders on 03/13/2010   Method used:   Historical   RxID:   [id number]

## 2010-04-10 NOTE — Medication Information (Signed)
Summary: rov/tm  Anticoagulant Therapy  Managed by: Bethena Midget, RN, BSN Referring MD: Sherryl Manges MD PCP: Dr [name] . Polite Supervising MD: Eden Emms MD, Theron Arista Indication 1: Atrial Fibrillation (ICD-427.31) Indication 2: Cerebrovascular Accident Lab Used: [facility] Site: [facility] INR POC 1.7 INR RANGE 2.0-3.0  Dietary changes: no    Health status changes: no    Bleeding/hemorrhagic complications: no    Recent/future hospitalizations: no    Any changes in medication regimen? no    Recent/future dental: no  Any missed doses?: no       Is patient compliant with meds? yes      Comments: Lovenox 120mg  Subcutaneously given into Left abd upper quad. Lot # [id number] EXp 03/2011  Allergies: No Known Drug Allergies  Anticoagulation Management History:      The patient is taking warfarin and comes in today for a routine follow up visit.  Positive risk factors for bleeding include an age of 75 years or older, history of CVA/TIA, and presence of serious comorbidities.  The bleeding index is 'high risk'.  Positive CHADS2 values include History of CHF, Age > 75 years old, History of Diabetes, and Prior Stroke/CVA/TIA.  The start date was 01/20/2004.  Her last INR was 2.0 ratio.  Anticoagulation responsible provider: Eden Emms MD, Theron Arista.  INR POC: 1.7.  Exp: 02/2011.    Anticoagulation Management Assessment/Plan:      The patient's current anticoagulation dose is Warfarin sodium 5 mg tabs: Use as directed by Anticoagulation Clinic.  The target INR is 2.0-3.0.  The next INR is due 02/18/2010.  Anticoagulation instructions were given to patient.  Results were reviewed/authorized by Bethena Midget, RN, BSN.  She was notified by Bethena Midget, RN, BSN.         Prior Anticoagulation Instructions: INR 1.2 Today and Saturday 10mg s then resume 7.5mg s daily. Continue Lovenox 120mg s subcutaneously daily. Recheck INR on Tuesday.    Current Anticoagulation Instructions: INR 1.7 Today and Wednesday  take 10mg s, then resume 7.5mg s daily except 5mg s on Saturday. Recheck in one week.

## 2010-04-10 NOTE — Progress Notes (Signed)
Summary: refill**[facility]**  Phone Note Refill Request   Refills Requested: Medication #1:  POTASSIUM CHLORIDE CRYS CR 20 MEQ CR-TABS 2 every am and 4 every pm   Supply Requested: 3 months [facility] [phone]   Method Requested: Fax to Mail Away Pharmacy Initial call taken by: Migdalia Dk,  August 13, 2009 11:19 AM  Follow-up for Phone Call       Follow-up by: Judithe Modest CMA,  August 15, 2009 9:12 AM    Prescriptions: POTASSIUM CHLORIDE CRYS CR 20 MEQ CR-TABS (POTASSIUM CHLORIDE CRYS CR) 2 every am and 4 every pm  #540 x 1   Entered by:   Judithe Modest CMA   Authorized by:   Nathen May, MD, [facility]   Signed by:   Judithe Modest CMA on 08/15/2009   Method used:   Electronically to        [facility]* (mail-order)       [address] [place], [place]  [address]       Ph: [phone]       Fax: [phone]   RxID:   [id number]

## 2010-04-10 NOTE — Miscellaneous (Signed)
Summary: Orders Update  Clinical Lists Changes  Orders: Added new Referral order of PV Procedure (PV Procedure) - Signed

## 2010-04-10 NOTE — Progress Notes (Signed)
Summary: Not cleared for surgery  Phone Note Outgoing Call   Request: Send information Summary of Call: call todd rosenbower office and tell his RN that this pt is NOT cleared for surgery until her airways are better i will see her again in 2 weeks Initial call taken by: Storm Frisk MD,  January 01, 2010 9:38 PM  Follow-up for Phone Call        Called Dr. Maris Berger office. Spoke with his nurse, Cyndra Numbers.  She was informed of above statement per Dr. Delford Field and verbalized understanding.  She will relay message to Dr. Abbey Chatters and requesting copy of this message be faxed to her.     Follow-up by: Gweneth Dimitri RN,  January 02, 2010 11:44 AM  Additional Follow-up for Phone Call Additional follow up Details #1::        Note faxed to Dr. Maris Berger office via biscom.   Gweneth Dimitri RN  January 02, 2010 11:45 AM

## 2010-04-10 NOTE — Medication Information (Signed)
Summary: [name]  Anticoagulant Therapy  Managed by: Jeralene Peters, PharmD Referring MD: Sherryl Manges MD PCP: Dr [name] . Polite Supervising MD: Riley Kill MD, Maisie Fus Indication 1: Atrial Fibrillation (ICD-427.31) Lab Used: [facility] Site: [facility] INR POC 2.4 INR RANGE 2.0-3.0  Dietary changes: no    Health status changes: no    Bleeding/hemorrhagic complications: no    Recent/future hospitalizations: no    Any changes in medication regimen? no    Recent/future dental: no  Any missed doses?: no       Is patient compliant with meds? yes       Current Medications (verified): 1)  Potassium Chloride Crys Cr 20 Meq Cr-Tabs (Potassium Chloride Crys Cr) .... 2 Every Am and 4 Every Pm 2)  Advair Diskus 250-50 Mcg/dose  Misc (Fluticasone-Salmeterol) .... One Puff Two Times A Day 3)  Spiriva Handihaler 18 Mcg  Caps (Tiotropium Bromide Monohydrate) .... One Capsule Once Daily 4)  Nasonex 50 Mcg/act  Susp (Mometasone Furoate) .... Two Puff Ea Nostril Once Daily As Needed 5)  Pletal 100 Mg  Tabs (Cilostazol) .... Take 1 Tablet By Mouth Once A Day 6)  Nexium 40 Mg  Cpdr (Esomeprazole Magnesium) .... By Mouth Daily. Take One Half Hour Before Eating. 7)  Mucinex 600 Mg  Xr12h-Tab (Guaifenesin) .... One By Mouth Two Times A Day 8)  Lipitor 20 Mg  Tabs (Atorvastatin Calcium) .... Once Daily 9)  Glucosamine 1500 Complex   Caps (Glucosamine-Chondroit-Vit C-Mn) .... Two Times A Day 10)  Cardizem 120 Mg Tabs (Diltiazem Hcl) .Marland Kitchen.. 1 Tab in Am 2 Tabs in Pm 11)  Fe-Caps 250 Mg Cr-Caps (Ferrous Sulfate) .... One Daily 12)  Xopenex Hfa 45 Mcg/act Aero (Levalbuterol Tartrate) .... 2 Puffs Every 6 Hours As Needed 13)  Furosemide 40 Mg Tabs (Furosemide) .... Take 2  Tab Two Times A Day 14)  Spironolactone 25 Mg Tabs (Spironolactone) .... Take One Tablet Once Daily 15)  Bayer Low Strength 81 Mg Tbec (Aspirin) .Marland Kitchen.. 1 By Mouth Daily 16)  Magnesium .Marland Kitchen.. 1 By Mouth Daiky 17)  Oxygen .... 2l At  Bedtime 18)  Glipizide 5 Mg Tabs (Glipizide) .Marland Kitchen.. 1 1/2 Once Daily 19)  Fish Oil 1000 Mg Caps (Omega-3 Fatty Acids) .... Take 1 Capsule By Mouth Two Times A Day 20)  Warfarin Sodium 5 Mg Tabs (Warfarin Sodium) .... Use As Directed By Anticoagulation Clinic 21)  Tikosyn 250 Mcg Caps (Dofetilide) .... Take One Capsule Every 12 Hours  Allergies (verified): No Known Drug Allergies  Anticoagulation Management History:      The patient is taking warfarin and comes in today for a routine follow up visit.  Positive risk factors for bleeding include an age of 30 years or older, history of CVA/TIA, and presence of serious comorbidities.  The bleeding index is 'high risk'.  Positive CHADS2 values include History of CHF, Age > 70 years old, History of Diabetes, and Prior Stroke/CVA/TIA.  The start date was 01/20/2004.  Her last INR was 2.2 RATIO.  Anticoagulation responsible provider: Riley Kill MD, Maisie Fus.  INR POC: 2.4.  Cuvette Lot#: [id number].  Exp: 07/2010.    Anticoagulation Management Assessment/Plan:      The patient's current anticoagulation dose is Warfarin sodium 5 mg tabs: Use as directed by Anticoagulation Clinic.  The target INR is 2.0-3.0.  The next INR is due 08/12/2009.  Anticoagulation instructions were given to patient.  Results were reviewed/authorized by Jeralene Peters, PharmD.         Prior Anticoagulation  Instructions: INR 2.3  Continue taking 1.5 tablets daily except 1 tablet on Saturday's.  Recheck in 3 weeks.   Current Anticoagulation Instructions: INR 2.4  Continue taking 1.5 tablets everyday except take 1 tablet on Saturdays.  Recheck in 4 weeks.

## 2010-04-10 NOTE — Assessment & Plan Note (Signed)
Summary: F1Y   Visit Type:  Follow-up Primary Provider:  Dr [name] . Polite  CC:  f1y/pt believes her heart is racing a little lately.  Marland Kitchen  History of Present Illness: 77 year-old woman presenting for follow-up of lower extremity PAD. She has undergone stenting of the right external iliac and SFA as well as PTA of the infragenicular vessels on the left. All of this was done many years ago. She does not have typical claudication symptoms, but complains of leg pain starting in the back and radiating down the right leg. She calls this her 'sciatica.' No rest pain or ulceration.  She has had a good deal of difficulty with atrial fibrillation and has undergone ablation procedures. She has had recurrent AF after these procedures. She denies palpitations but complains of generalized weakness and fatigue. No orthopnea or PND. Complains of leg swelling at times, depending on salt intake. She continues to smoke and does not adhere to a diet.    Current Medications (verified): 1)  Potassium Chloride Crys Cr 20 Meq Cr-Tabs (Potassium Chloride Crys Cr) .... 2 Every Am and 4 Every Pm 2)  Advair Diskus 250-50 Mcg/dose  Misc (Fluticasone-Salmeterol) .... One Puff Two Times A Day 3)  Spiriva Handihaler 18 Mcg  Caps (Tiotropium Bromide Monohydrate) .... One Capsule Once Daily 4)  Nasonex 50 Mcg/act  Susp (Mometasone Furoate) .... Two Puff Ea Nostril Once Daily As Needed 5)  Pletal 100 Mg  Tabs (Cilostazol) .... Take 1 Tablet By Mouth Once A Day 6)  Nexium 40 Mg  Cpdr (Esomeprazole Magnesium) .... By Mouth Daily. Take One Half Hour Before Eating. 7)  Mucinex 600 Mg  Xr12h-Tab (Guaifenesin) .... One By Mouth Two Times A Day 8)  Lipitor 20 Mg  Tabs (Atorvastatin Calcium) .... Once Daily 9)  Glucosamine 1500 Complex   Caps (Glucosamine-Chondroit-Vit C-Mn) .... Two Times A Day 10)  Cardizem 120 Mg Tabs (Diltiazem Hcl) .Marland Kitchen.. 1 Tab in Am 2 Tabs in Pm 11)  Fe-Caps 250 Mg Cr-Caps (Ferrous Sulfate) .... One Daily 12)   Xopenex Hfa 45 Mcg/act Aero (Levalbuterol Tartrate) .... 2 Puffs Every 6 Hours As Needed 13)  Furosemide 40 Mg Tabs (Furosemide) .... Take 2  Tab Two Times A Day 14)  Spironolactone 25 Mg Tabs (Spironolactone) .... Take One Tablet Once Daily 15)  Bayer Low Strength 81 Mg Tbec (Aspirin) .Marland Kitchen.. 1 By Mouth Daily 16)  Magnesium .Marland Kitchen.. 1 By Mouth Daiky 17)  Glipizide 5 Mg Tabs (Glipizide) .Marland Kitchen.. 1 1/2 Once Daily 18)  Fish Oil 1000 Mg Caps (Omega-3 Fatty Acids) .... Take 1 Capsule By Mouth Two Times A Day 19)  Warfarin Sodium 5 Mg Tabs (Warfarin Sodium) .... Use As Directed By Anticoagulation Clinic 20)  Tikosyn 250 Mcg Caps (Dofetilide) .... Take One Capsule Every 12 Hours  Allergies (verified): No Known Drug Allergies  Past History:  Past medical history reviewed for relevance to current acute and chronic problems.  Past Medical History: HYPERSOMNIA, ASSOCIATED WITH SLEEP APNEA (ICD-780.53) Amiodarone Lung Toxicity RENAL DISEASE, CHRONIC  Creatinine 1.11 January 2009 CVA (ICD-434.91) DM (ICD-250.00) cigarette abuse  DIASTOLIC HEART FAILURE, ACUTE ON CHRONIC (ICD-428.33) PACEMAKER, PERMANENT   ATRIAL FIBRILLATION (ICD-427.31)       -AV ablation 9/09 [facility] per Dr Sampson Goon  HYPERLIPIDEMIA (ICD-272.4) GERD (ICD-530.81) CORONARY ARTERY DISEASE not sure of htis 11/10 COPD (ICD-496)     -FeV1 73%  DLCO 53% 5/09 PAD with hx right iliac/SFA stenting and left leg PTA  Review of Systems  Negative except as per HPI   Vital Signs:  Patient profile:   77 year old female Height:      65.5 inches Weight:      184 pounds BMI:     30.26 Pulse rate:   130 / minute Pulse rhythm:   regular Resp:     16 per minute BP sitting:   102 / 62  (left arm) Cuff size:   regular  Vitals Entered By: Judithe Modest CMA (August 20, 2009 3:40 PM)  Physical Exam  General:  Pt is alert and oriented, elderly woman, in no acute distress. HEENT: normal Neck: normal carotid upstrokes without bruits, JVP  normal Lungs: scattered rhonchi CV: Regular, tachycardic without murmur or gallop Abd: soft, NT, positive BS, no bruit, no organomegaly Ext: no clubbing, cyanosis, or edema. pedal pulses 1+= Skin: warm and dry without rash    EKG  Procedure date:  08/20/2009  Findings:      Atrial flutter with RVR, HR 130 bpm, single aberrant beat, cannot rule out age-indeterminate anterior infarct  PPM Specifications Following MD:  Sherryl Manges, MD     PPM Vendor:  [employer]     PPM Model Number:  [id number]     PPM Serial Number:  [id number] PPM DOI:  01/23/2004     PPM Implanting MD:  Sherryl Manges, MD  Lead 1    Location: [facility]     DOI: 01/23/2004     Model #: [id number]     Serial #: [id number]     Status: active Lead 2    Location: RV     DOI: 01/23/2004     Model #: [id number]     Serial #: [id number]     Status: active  Magnet Response Rate:  BOL 98.6  Indications:  TACHY BRADY SYNDROME   PPM Follow Up Pacer Dependent:  No      Episodes Coumadin:  Yes  Parameters Mode:  DDIR     Lower Rate Limit:  70     Upper Rate Limit:  90 Paced AV Delay:  275     Sensed AV Delay:  250  Impression & Recommendations:  Problem # 1:  [name] (ICD-443.9) PAD is stable at present with no evidence of limb ischemia by symptoms or exam. Continue risk reduction with ASA/Coumadin, needs to stop smoking but no interest in doing so. Secondary risk reduction as below.  Problem # 2:  ATRIAL FIBRILLATION (ICD-427.31) Pt with atrial flutter with RVR. This is her most pressing issue today. She is planning on flying to [facility] to visit family on Friday. I reviewed her recent anticoag checks and unfortunately her INR was only 1.8 last week. I think she will benefit from TEE/CV to restore sinus rhythm and prevent recurrence of CHF. This has been scheduled with Dr Eden Emms on June 16th. Risks and indications reviewed with patient who agrees to proceed. She will remain on Tikosyn. INR checked today and was back in therapeutic range.  She will f/u with Dr  Graciela Husbands when she returns from [place].  Her updated medication list for this problem includes:    Pletal 100 Mg Tabs (Cilostazol) .Marland Kitchen... Take 1 tablet by mouth once a day    Bayer Low Strength 81 Mg Tbec (Aspirin) .Marland Kitchen... 1 by mouth daily    Warfarin Sodium 5 Mg Tabs (Warfarin sodium) ..... Use as directed by anticoagulation clinic    Tikosyn 250 Mcg Caps (Dofetilide) .Marland Kitchen... Take one capsule every 12 hours  Orders: EKG  w/ Interpretation (93000) Trans Esophageal Echo Cardioversion (TEE-Cardioversion) TLB-BMP (Basic Metabolic Panel-BMET) (80048-METABOL) TLB-CBC Platelet - w/Differential (85025-CBCD)  Patient Instructions: 1)  Your physician has requested that you have a TEE/Cardioversion.  During a TEE, sound waves are used to create images of your heart. It provides your doctor with information about the size and shape of your heart and how well your heart's chambers and valves are working. In this test, a transducer is attached to the end of a flexible tube that is guided down your throat and into your esophagus (the tube leading from your mouth to your stomach) to get a more detailed image of your heart. Once the TEE has determined that a blood clot is not present, the cardioversion begins.  Electrical cardioversion uses a jolt of electricity to your heart either through paddles or wired patches attached to your chest. This is a controlled, usually prescheduled, procedure. This procedure is done at the hospital and you are not awake during the procedure.  You usually go home the day of the procedure. Please see the instruction sheet given to you today for more information. 2)  Your physician recommends that you schedule a follow-up appointment in: 2 WEEKS with Dr Graciela Husbands 3)  Your physician wants you to follow-up in:   1 YEAR with Dr Excell Seltzer. You will receive a reminder letter in the mail two months in advance. If you don't receive a letter, please call our office to schedule the follow-up appointment.

## 2010-04-10 NOTE — Medication Information (Signed)
Summary: rov/sp  Anticoagulant Therapy  Managed by: Bethena Midget, RN, BSN Referring MD: Sherryl Manges MD PCP: Dr [name] . Polite Supervising MD: Excell Seltzer MD, Casimiro Needle Indication 1: Atrial Fibrillation (ICD-427.31) Indication 2: Cerebrovascular Accident Lab Used: [facility] Site: [facility] INR POC 1.2 INR RANGE 2.0-3.0  Dietary changes: no    Health status changes: no    Bleeding/hemorrhagic complications: no    Recent/future hospitalizations: no    Any changes in medication regimen? no    Recent/future dental: no  Any missed doses?: no       Is patient compliant with meds? yes      Comments: Had Lumpectomy on 02/03/10 off 2 days prior. Restarted coumadin and Lovenox on Tuesday.   Allergies: No Known Drug Allergies  Anticoagulation Management History:      The patient is taking warfarin and comes in today for a routine follow up visit.  Positive risk factors for bleeding include an age of 85 years or older, history of CVA/TIA, and presence of serious comorbidities.  The bleeding index is 'high risk'.  Positive CHADS2 values include History of CHF, Age > 63 years old, History of Diabetes, and Prior Stroke/CVA/TIA.  The start date was 01/20/2004.  Her last INR was 2.0 ratio.  Anticoagulation responsible provider: Excell Seltzer MD, Casimiro Needle.  INR POC: 1.2.  Cuvette Lot#: [id number].  Exp: 02/2011.    Anticoagulation Management Assessment/Plan:      The patient's current anticoagulation dose is Warfarin sodium 5 mg tabs: Use as directed by Anticoagulation Clinic.  The target INR is 2.0-3.0.  The next INR is due 02/11/2010.  Anticoagulation instructions were given to patient.  Results were reviewed/authorized by Bethena Midget, RN, BSN.  She was notified by Bethena Midget, RN, BSN.         Prior Anticoagulation Instructions: INR 3.8 Skip today's dose then Change dose  back to 7.5mg s daily except 5mg s on Saturdays.  11/23- Take last dose of Coumadin 11/24- No Coumadin or Lovenox 11/25- Start  Lovenox 120mg  injection once daily in the AM 11/26-11/27- Continue Lovenox once daily 11/28- Procedure.  Follow MDs orders for when to restart Coumadin and Lovenox.  When okay to start Coumadin, take 2 tablets (10mg ) for 2 days then resume normal dose of 7.5mg  every day except 5mg  on Saturday.  Continue Lovenox 120mg  once daily until INR >2.0.    Current Anticoagulation Instructions: INR 1.2 Today and Saturday 10mg s then resume 7.5mg s daily. Continue Lovenox 120mg s subcutaneously daily. Recheck INR on Tuesday.

## 2010-04-10 NOTE — Letter (Signed)
Summary: Cardioversion/TEE Instructions  Architectural technologist, Main Office  [address]   [place], [place] [address]   Phone: [phone]  Fax: [phone]    Cardioversion / TEE Cardioversion Instructions  You are scheduled for a Cardioversion / TEE Cardioversion on JAN. 27, 2011 with Dr. Jens Som.   Please arrive at the [facility] at 1000 a.m.  on the day of your procedure.  1)   DIET:  A)   Nothing to eat or drink after midnight except your medications with a sip of water.  B)   May have clear liquid breakfast, then nothing to eat or drink after midnight on 04/03/09     2)   Lab work will be done in the hospital that morning.   3)   MAKE SURE YOU TAKE YOUR COUMADIN.  4)   A)   DO NOT TAKE these medications before your procedure:      Only take 1/2 your diabetic medication the night before- on 04/03/09     Do NOT take the morning of: any diabetic medication, fursomide or spironolactone.     B)   YOU MAY TAKE ALL of your remaining medications with a small amount of water.    5)  Must have a responsible person to drive you home.  6)   Bring a current list of your medications and current insurance cards.   * Special Note:  Every effort is made to have your procedure done on time. Occasionally there are emergencies that present themselves at the hospital that may cause delays. Please be patient if a delay does occur.  * If you have any questions after you get home, please call the office at [phone].

## 2010-04-10 NOTE — Progress Notes (Signed)
Summary: [name]**  Phone Note Outgoing Call   Call placed by: Duncan Dull, RN, BSN,  May 01, 2009 11:58 AM Call placed to: Patient Summary of Call: Left message with gentleman at residence for Ms. Hebenstreit to call me back in reference to the dates she had requested Duncan Dull, RN, BSN  May 01, 2009 11:59 AM   Follow-up for Phone Call        PT RETURNED CALL.SIGN Follow-up by: Judie Grieve,  May 01, 2009 1:35 PM  Additional Follow-up for Phone Call Additional follow up Details #1::        Called patient and left message on machine  Duncan Dull, RN, BSN  May 01, 2009 1:58 PM    Pt wanted the first date that Dr.Klein ever treated her and it is Jan 23, 2004 Additional Follow-up by: Duncan Dull, RN, BSN,  May 01, 2009 2:24 PM

## 2010-04-10 NOTE — Progress Notes (Signed)
Summary: change nasonex to fluticasone  Phone Note Outgoing Call Call back at [phone]   Call placed by: Boone Master CNA/MA,  October 29, 2009 5:19 PM Call placed to: [facility] Summary of Call: called [facility] to initiate PA on pt's nasonex.  fax to be sent to the triage fax #. Boone Master CNA/MA  October 29, 2009 5:21 PM   Follow-up for Phone Call        form received and placed in PEW's lookat. Boone Master CNA/MA  October 29, 2009 5:27 PM   form completed by PW, faxed back to [facility] at [phone].  Form placed in triage with the awaiting approval/denial forms.  Gweneth Dimitri RN  October 31, 2009 4:41 PM   Additional Follow-up for Phone Call Additional follow up Details #1::        denial letter has been receieved and placed in PW look at to see if he would like to change the nasonex to something else.   Randell Loop [facility]  November 01, 2009 4:28 PM     Additional Follow-up for Phone Call Additional follow up Details #2::    Per PW, ok to change nasonex to generic Flonase.  Fluticasone rx sent to [facility]  ATC pt's home number x 3 -- received a fast line busy signal.  WCB. Gweneth Dimitri RN  November 06, 2009 3:51 PM  [facility] TCB x1. Boone Master CNA/MA  November 06, 2009 4:50 PM   [facility] flonase rx; she did not.  LMOM for pt TCB to inform her of the change from nasonex to flonase. Boone Master CNA/MA  November 07, 2009 2:45 PM   Additional Follow-up for Phone Call Additional follow up Details #3:: Details for Additional Follow-up Action Taken: pt returned call.  informed pt of change in rx's.  pt states that she will pick rx today but requests a 90day supply be sent to [facility].  rx sent per her request. Boone Master CNA/MA  November 08, 2009 10:51 AM   New/Updated Medications: FLUTICASONE PROPIONATE 50 MCG/ACT SUSP (FLUTICASONE PROPIONATE) 2 sprays each nostril at  bedtime Prescriptions: FLUTICASONE PROPIONATE 50 MCG/ACT SUSP (FLUTICASONE PROPIONATE) 2 sprays each nostril at bedtime  #90days x 3   Entered by:   Boone Master CNA/MA   Authorized by:   Storm Frisk MD   Signed by:   Boone Master CNA/MA on 11/08/2009   Method used:   [facility]        [facility]       [address]       [place], [place]  [address]       Ph: [phone]       Fax: [phone]   RxID:   [id number] FLUTICASONE PROPIONATE 50 MCG/ACT SUSP (FLUTICASONE PROPIONATE) 2 sprays each nostril at bedtime  #1 x 6   Entered by:   Gweneth Dimitri RN   Authorized by:   Storm Frisk MD   Signed by:   Boone Master CNA/MA on 11/06/2009   Method used:   [facility] to        [facility] [phone][facility] (retail)       [address]       [place], [place]  [address]       Ph: [phone] or [phone]       Fax:  [phone]   RxID:   [id number]

## 2010-04-10 NOTE — Letter (Signed)
Summary: [facility] - Office Note  [facility] - Office Note   Imported By: Marylou Mccoy 01/27/2010 09:39:50  _____________________________________________________________________  External Attachment:    Type:   Image     Comment:   External Document

## 2010-04-10 NOTE — Medication Information (Signed)
Summary: rov/tm  Anticoagulant Therapy  Managed by: Bethena Midget, RN, BSN Referring MD: Sherryl Manges MD PCP: Dr [name] . Polite Supervising MD: Gala Romney MD, Reuel Boom Indication 1: Atrial Fibrillation (ICD-427.31) Indication 2: Cerebrovascular Accident Lab Used: [facility] Site: [facility] INR RANGE 2.0-3.0  Dietary changes: no    Health status changes: no    Bleeding/hemorrhagic complications: no    Recent/future hospitalizations: no    Any changes in medication regimen? no    Recent/future dental: no  Any missed doses?: no       Is patient compliant with meds? yes       Allergies: No Known Drug Allergies  Anticoagulation Management History:      Positive risk factors for bleeding include an age of 27 years or older, history of CVA/TIA, and presence of serious comorbidities.  The bleeding index is 'high risk'.  Positive CHADS2 values include History of CHF, Age > 31 years old, History of Diabetes, and Prior Stroke/CVA/TIA.  The start date was 01/20/2004.  Her last INR was 2.0 ratio and today's INR is 2.1.  Anticoagulation responsible provider: Bensimhon MD, Reuel Boom.  Exp: 02/2011.    Anticoagulation Management Assessment/Plan:      The patient's current anticoagulation dose is Warfarin sodium 5 mg tabs: Use as directed by Anticoagulation Clinic.  The target INR is 2.0-3.0.  The next INR is due 03/06/2010.  Anticoagulation instructions were given to patient.  Results were reviewed/authorized by Bethena Midget, RN, BSN.         Prior Anticoagulation Instructions: INR 1.7 Today and Wednesday take 10mg s, then resume 7.5mg s daily except 5mg s on Saturday. Recheck in one week.   Current Anticoagulation Instructions: INR 2.1 take 1.5 tablet (7.5mg ) every day recheck INR in 2 weeks on 12/29

## 2010-04-10 NOTE — Assessment & Plan Note (Signed)
Summary: Pulmonary OV   Primary [name]/Referring [name]:  Dr [name] . Polite  CC:  Breathing is better, cough is less, and pend left breast surgery per Dr. [name] decision.  History of Present Illness:   77 year old, white female with history of chronic obstructive lung disease, asthmatic bronchitis, and obstructive sleep apnea.   Active smoker and has failed smoking cessation on multiple attempts.   February 12, 2009 10:48 AM To see EP MD at [facility].  Having more PAF.  For a potential ablation.  Had prior ablation at [facility]. Recently cough ok. Had diuretics increased 10/10.  Still on same doses. Had a stress test but not able to complete due to hypotension.  Stopped tikosyn and on [place] and it makes pt nauseated.  The weight is up.  The pt notes less edema  April 22, 2009 2:17 PM Had lots of salt over the past 04/19/09 weekend so is up on the weight.  The pt is not having much cough now.  The pt is  still dyspneic with exertion.  There is no chest pain.  The pt did not undergo DCCV.  She is on Tikosyn as of now for Afib.   The Multaq was stopped.  She did not get a repeat ablation at [facility].  July 01, 2009 12:09 PM Cough ok,  dyspnea ok,  worse when humid and rainy.  Feels ok.  Smoking still. Pt denies any significant sore throat, nasal congestion or excess secretions, fever, chills, sweats, unintended weight loss, pleurtic or exertional chest pain, orthopnea PND, or leg swelling   October 25, 2009 2:51 PM Pt is to have AV node ablation on 8/31.  Bad cough one day ago.  Non productive with the cough.  Dyspnea is about the same.  Notes sl wheezing.    [facility]. No other new issues.  The pt still smokes 1PPD!! December 31, 2009 3:46 PM L breast CA  newly dx.  [name] to operate.    Pt needs a lumpectomy.  found with mammogram. Now: has been outside in the yard working.  Now coughing 4days ago.  Eyes running now.  Nose runs.  Sneezes but no itching.   Dyspnea  not as good.   Mucus is clear   Surgery is pending.    January 13, 2010 12:14 PM Feels better now.  Never coughed up much.  No chest pain.  Dyspnea is better vs 10/25. Not inhaling as much cigarettes.   Pt denies any significant sore throat, nasal congestion or excess secretions, fever, chills, sweats, unintended weight loss, pleurtic or exertional chest pain, orthopnea PND, or leg swelling Pt denies any increase in rescue therapy over baseline, denies waking up needing it or having any early am or nocturnal exacerbations of coughing/wheezing/or dyspnea.    Preventive Screening-Counseling & Management  Alcohol-Tobacco     Smoking Status: current     Smoking Cessation Counseling: yes     Packs/Day: 0.5     Year Started: 1956  Current Medications (verified): 1)  Potassium Chloride Crys Cr 20 Meq Cr-Tabs (Potassium Chloride Crys Cr) .... 2 Every Am and 3 Every Pm 2)  Advair Diskus 250-50 Mcg/dose  Misc (Fluticasone-Salmeterol) .... One Puff Two Times A Day 3)  Spiriva Handihaler 18 Mcg  Caps (Tiotropium Bromide Monohydrate) .... One Capsule Once Daily 4)  Pletal 100 Mg  Tabs (Cilostazol) .... Take 1 Tablet By Mouth Two Times A Day 5)  Nexium 40 Mg  Cpdr (Esomeprazole Magnesium) .... Take One Half Hour Before Eating. Take One Tablet By Mouth Every Other Day 6)  Mucinex 600 Mg  Xr12h-Tab (Guaifenesin) .... One By Mouth Two Times A Day 7)  Lipitor 20 Mg  Tabs (Atorvastatin Calcium) .... Once Daily 8)  Glucosamine 1500 Complex   Caps (Glucosamine-Chondroit-Vit C-Mn) .... Two Times A Day 9)  Fe-Caps 250 Mg Cr-Caps (Ferrous Sulfate) .... One Daily 10)  Xopenex Hfa 45 Mcg/act Aero (Levalbuterol Tartrate) .... 2 Puffs Every 6 Hours As Needed 11)  Furosemide 40 Mg Tabs (Furosemide) .... Take 2  Tab Two Times A Day 12)  Bayer Low Strength 81 Mg Tbec (Aspirin) .Marland Kitchen.. 1 By Mouth Daily 13)  Magnesium 250 Mg Tabs (Magnesium) .... Take 1 Tablet By Mouth Once A Day 14)  Glipizide 5 Mg Tabs (Glipizide)  .Marland Kitchen.. 1 1/2 Once Daily 15)  Fish Oil 1000 Mg Caps (Omega-3 Fatty Acids) .... Take 1 Capsule By Mouth Two Times A Day 16)  Warfarin Sodium 5 Mg Tabs (Warfarin Sodium) .... Use As Directed By Anticoagulation Clinic 17)  Nasonex 50 Mcg/act  Susp (Mometasone Furoate) .... Two Puffs Each Nostril Daily Use Until Samples Gone 18)  Clarinex 5 Mg  Tabs (Desloratadine) .... One Tablet By Mouth Daily Use Until Samples Gone  Allergies (verified): No Known Drug Allergies  Past History:  Past medical, surgical, family and social histories (including risk factors) reviewed, and no changes noted (except as noted below).  Past Medical History: Reviewed history from 12/30/2009 and no changes required. HYPERSOMNIA, ASSOCIATED WITH SLEEP APNEA (ICD-780.53) Amiodarone Lung Toxicity RENAL DISEASE, CHRONIC  Creatinine 1.11 January 2009 CVA (ICD-434.91) DM (ICD-250.00) cigarette abuse  DIASTOLIC HEART FAILURE, ACUTE ON CHRONIC (ICD-428.33) PACEMAKER, PERMANENT   ATRIAL FIBRILLATION (ICD-427.31)       -AV ablation 9/09 [facility] per Dr Sampson Goon       - AV node ablation 09/11 Dr. Joseph Berkshire (ICD-272.4) GERD (ICD-530.81) CORONARY ARTERY DISEASE not sure of htis 11/10 COPD (ICD-496)     -FeV1 73%  DLCO 53% 5/09 PAD with hx right iliac/SFA stenting and left leg PTA Invasive ductal carcimoma-left breast-2011  Past Surgical History: Reviewed history from 03/12/2009 and no changes required. Pulmonary vein isolation x2 [facility] at [facility] and once at [facility]  Past Pulmonary History:  Pulmonary History: COPD     -FeV1 73%  DLCO 53% 5/09  Family History: Reviewed history from 06/01/2008 and no changes required. That mother died of unknown cause.  Father died   with heart disease.  The patient has two sisters who are alive and well.   No brothers.   Social History: Reviewed history from 10/25/2009 and no changes required. 1/2 cup decaff  Tai-chi & water aerobics - garden, active lifestyle Retired   Married  Tobacco Use - Yes.  Alcohol Use - yes Patient is a current smoker.  Packs/Day:  0.5  Review of Systems       The patient complains of shortness of breath with activity, productive cough, and non-productive cough.  The patient denies shortness of breath at rest, coughing up blood, chest pain, irregular heartbeats, acid heartburn, indigestion, loss of appetite, weight change, abdominal pain, difficulty swallowing, sore throat, tooth/dental problems, headaches, nasal congestion/difficulty breathing through nose, sneezing, itching, ear ache, anxiety, depression, hand/feet swelling, joint stiffness or pain, rash, change in color of mucus, and fever.    Vital Signs:  Patient profile:   77 year old female Height:      65.5 inches Weight:  185 pounds BMI:     30.43 O2 Sat:      91 % on Room air Temp:     98.2 degrees F oral Pulse rate:   72 / minute BP sitting:   110 / 60  (left arm) Cuff size:   regular  Vitals Entered By: Zackery Barefoot, CMA (January 13, 2010 12:03 PM)  O2 Flow:  Room air CC: Breathing is better, cough is less, pend left breast surgery per Dr. [name] decision Comments Medications reviewed with patient Zackery Barefoot, CMANovember  7, 2011 12:04 PM    Physical Exam  Additional Exam:  Gen: Pleasant, well nourished, in no distress, on supp oxygen ENT: no lesions, no postnasal drip Neck: No JVD, no TMG, no carotid bruits Lungs: no use of accessory muscles, no dullness to percussion, distant BS, improved airflow Cardiovascular: RRR, heart sounds normal, no murmurs or gallops, 1+  peripheral edema Musculoskeletal: No deformities, no cyanosis or clubbing  ABD:  increased ascites     Impression & Recommendations:  Problem # 1:  COPD (ICD-496) Assessment Improved  copd with acute tracheobronchitis flare,now improved plan cont inhaled mes no further antibiotics Ok to undergo consious sedation with lumpectomy  Medications Added to Medication  List This Visit: 1)  Magnesium 250 Mg Tabs (Magnesium) .... Take 1 tablet by mouth once a day  Complete Medication List: 1)  Potassium Chloride Crys Cr 20 Meq Cr-tabs (Potassium chloride crys cr) .... 2 every am and 3 every pm 2)  Advair Diskus 250-50 Mcg/dose Misc (Fluticasone-salmeterol) .... One puff two times a day 3)  Spiriva Handihaler 18 Mcg Caps (Tiotropium bromide monohydrate) .... One capsule once daily 4)  Pletal 100 Mg Tabs (Cilostazol) .... Take 1 tablet by mouth two times a day 5)  Nexium 40 Mg Cpdr (Esomeprazole magnesium) .... Take one half hour before eating. take one tablet by mouth every other day 6)  Mucinex 600 Mg Xr12h-tab (Guaifenesin) .... One by mouth two times a day 7)  Lipitor 20 Mg Tabs (Atorvastatin calcium) .... Once daily 8)  Glucosamine 1500 Complex Caps (Glucosamine-chondroit-vit c-mn) .... Two times a day 9)  Fe-caps 250 Mg Cr-caps (Ferrous sulfate) .... One daily 10)  Xopenex Hfa 45 Mcg/act Aero (Levalbuterol tartrate) .... 2 puffs every 6 hours as needed 11)  Furosemide 40 Mg Tabs (Furosemide) .... Take 2  tab two times a day 12)  Bayer Low Strength 81 Mg Tbec (Aspirin) .Marland Kitchen.. 1 by mouth daily 13)  Magnesium 250 Mg Tabs (Magnesium) .... Take 1 tablet by mouth once a day 14)  Glipizide 5 Mg Tabs (Glipizide) .Marland Kitchen.. 1 1/2 once daily 15)  Fish Oil 1000 Mg Caps (Omega-3 fatty acids) .... Take 1 capsule by mouth two times a day 16)  Warfarin Sodium 5 Mg Tabs (Warfarin sodium) .... Use as directed by anticoagulation clinic 17)  Nasonex 50 Mcg/act Susp (Mometasone furoate) .... Two puffs each nostril daily use until samples gone 18)  Clarinex 5 Mg Tabs (Desloratadine) .... One tablet by mouth daily use until samples gone  Other Orders: Est. Patient Level III (16109)  Patient Instructions: 1)  You are cleared for surgery 2)  Stay on nasonex 3)  No more prednisone 4)  Return 3 months 5)  Use cpap postop 6)  Minmimize smoking

## 2010-04-10 NOTE — Cardiovascular Report (Signed)
Summary: TTM   TTM   Imported By: Roderic Ovens 04/26/2009 15:51:27  _____________________________________________________________________  External Attachment:    Type:   Image     Comment:   External Document

## 2010-04-10 NOTE — Medication Information (Signed)
Summary: rov/ewj  Anticoagulant Therapy  Managed by: Reina Fuse, PharmD Referring MD: Sherryl Manges MD PCP: Dr [name] . Polite Supervising MD: Excell Seltzer MD, Casimiro Needle Indication 1: Atrial Fibrillation (ICD-427.31) Lab Used: [facility] Site: [facility] INR POC 2.2 INR RANGE 2.0-3.0  Dietary changes: no    Health status changes: no    Bleeding/hemorrhagic complications: yes       Details: bruise on foot with some soreness  Recent/future hospitalizations: no    Any changes in medication regimen? no    Recent/future dental: no  Any missed doses?: no       Is patient compliant with meds? yes      Comments: A/V nodal ablation on 8/31.  Allergies: No Known Drug Allergies  Anticoagulation Management History:      The patient is taking warfarin and comes in today for a routine follow up visit.  Positive risk factors for bleeding include an age of 22 years or older, history of CVA/TIA, and presence of serious comorbidities.  The bleeding index is 'high risk'.  Positive CHADS2 values include History of CHF, Age > 21 years old, History of Diabetes, and Prior Stroke/CVA/TIA.  The start date was 01/20/2004.  Her last INR was 2.2 RATIO.  Anticoagulation responsible provider: Excell Seltzer MD, Casimiro Needle.  INR POC: 2.2.  Cuvette Lot#: [id number].  Exp: 10/2010.    Anticoagulation Management Assessment/Plan:      The patient's current anticoagulation dose is Warfarin sodium 5 mg tabs: Use as directed by Anticoagulation Clinic.  The target INR is 2.0-3.0.  The next INR is due 11/14/2009.  Anticoagulation instructions were given to patient.  Results were reviewed/authorized by Reina Fuse, PharmD.  She was notified by Reina Fuse PharmD.         Prior Anticoagulation Instructions: INR 2.1  Continue on same dosage 1.5 tablets daily except 1 tablet on Saturdays.  Recheck in 4 weeks.    Current Anticoagulation Instructions: INR 2.2  Continue taking Coumadin 1.5 tabs (7.5 mg) on all days except Coumadin 1 tab  (5 mg) on Saturdays.  Return to clinic in 7-10 days following ablation.

## 2010-04-10 NOTE — Cardiovascular Report (Signed)
Summary: TTM   TTM   Imported By: Roderic Ovens 10/23/2009 10:53:35  _____________________________________________________________________  External Attachment:    Type:   Image     Comment:   External Document

## 2010-04-10 NOTE — Medication Information (Signed)
Summary: rov/mwb  Anticoagulant Therapy  Managed by: Eda Keys, PharmD Referring MD: Sherryl Manges MD PCP: Dr. Debby Bud Supervising MD: Jens Som MD, Arlys John Indication 1: Atrial Fibrillation (ICD-427.31) Indication 2: Cerebrovascular Accident Lab Used: [facility] Site: [facility] INR POC 3.6 INR RANGE 2.0-3.0  Dietary changes: no    Health status changes: no    Bleeding/hemorrhagic complications: no    Recent/future hospitalizations: no    Any changes in medication regimen? yes       Details: pt has started a new breast cancer medication, will call us with the name of this medication  Recent/future dental: no  Any missed doses?: no       Is patient compliant with meds? yes      Comments: Patient did change the dose the first week, but then went back to her normal dosing schedule.    Allergies: No Known Drug Allergies  Anticoagulation Management History:      The patient is taking warfarin and comes in today for a routine follow up visit.  Positive risk factors for bleeding include an age of 14 years or older, history of CVA/TIA, and presence of serious comorbidities.  The bleeding index is 'high risk'.  Positive CHADS2 values include History of CHF, Age > 72 years old, History of Diabetes, and Prior Stroke/CVA/TIA.  The start date was 01/20/2004.  Her last INR was 2.1.  Anticoagulation responsible provider: Jens Som MD, Arlys John.  INR POC: 3.6.  Cuvette Lot#: [id number].  Exp: 04/2011.    Anticoagulation Management Assessment/Plan:      The patient's current anticoagulation dose is Warfarin sodium 5 mg tabs: Use as directed by Anticoagulation Clinic.  The target INR is 2.0-3.0.  The next INR is due 03/20/2010.  Anticoagulation instructions were given to patient.  Results were reviewed/authorized by Eda Keys, PharmD.  She was notified by Eda Keys.         Prior Anticoagulation Instructions: INR 2.1 take 1.5 tablet (7.5mg ) every day recheck INR in 2 weeks on  12/29  Current Anticoagulation Instructions: INR 3.6  Do NOT take coumadin today.  Then return to normal dosing schedule of 1 tablet on Saturday and 1.5 tablets all other days.  Return to clinic in 2 weeks.

## 2010-04-10 NOTE — Progress Notes (Signed)
  Phone Note Refill Request Call back at Home Phone [phone] Message from:  Patient on [facility] script  Refills Requested: Medication #1:  KLOR-CON 20 MEQ  PACK take 1 tablet in the morning and 2at night needs a refill sent into [facility] script.Lorne Skeens Zwick id number [id number]...directions are wrong she takes 1in the am and two in the pm  Initial call taken by: Omer Jack,  April 22, 2009 1:19 PM  Follow-up for Phone Call       Follow-up by: Judithe Modest CMA,  April 22, 2009 1:47 PM    Prescriptions: KLOR-CON 20 MEQ  PACK (POTASSIUM CHLORIDE) take 1 tablet in the morning and 2at night  #270 x 2   Entered by:   Judithe Modest CMA   Authorized by:   Nathen May, MD, [facility]   Signed by:   Judithe Modest CMA on 04/22/2009   Method used:   Electronically to        [facility]* (mail-order)       [address] [place], [place]  [address]       Ph: [phone]       Fax: [phone]   RxID:   [id number]   Appended Document:  Called [facility] to correct this as she takes the tablets and does not use the powder.  Per representative the tablets were filled on 04/11/2009.  Call received by patient on the 14th of Feb.  stated that the pharmacy still hadnt gotten her prescription.  Pharmacist corrected.  Shipment of tablets will be sent out to patient for the Potassium Chlor 20 MEQ tabs.

## 2010-04-10 NOTE — Letter (Signed)
Summary: [facility] Progress Note  [facility] Progress Note   Imported By: Roderic Ovens 06/24/2009 11:21:41  _____________________________________________________________________  External Attachment:    Type:   Image     Comment:   External Document

## 2010-04-10 NOTE — Assessment & Plan Note (Signed)
Summary: Pulmonary OV   Primary Provider/Referring Provider:  Dr [name] . Polite  CC:  3 month followup.  Pt states that overall her breathing has been okay.  She states that she has noticed that it worsens in rainy or humid weather.  No complaints today.  Still smoking less than 1 ppd..  History of Present Illness:   77 year old, white female with history of chronic obstructive lung disease, asthmatic bronchitis, and obstructive sleep apnea.   Active smoker and has failed smoking cessation on multiple attempts.   February 12, 2009 10:48 AM To see EP MD at [facility].  Having more PAF.  For a potential ablation.  Had prior ablation at [facility]. Recently cough ok. Had diuretics increased 10/10.  Still on same doses. Had a stress test but not able to complete due to hypotension.  Stopped tikosyn and on [place] and it makes pt nauseated.  The weight is up.  The pt notes less edema  April 22, 2009 2:17 PM Had lots of salt over the past 04/19/09 weekend so is up on the weight.  The pt is not having much cough now.  The pt is  still dyspneic with exertion.  There is no chest pain.  The pt did not undergo DCCV.  She is on Tikosyn as of now for Afib.   The Multaq was stopped.  She did not get a repeat ablation at [facility].  July 01, 2009 12:09 PM Cough ok,  dyspnea ok,  worse when humid and rainy.  Feels ok.  Smoking still. Pt denies any significant sore throat, nasal congestion or excess secretions, fever, chills, sweats, unintended weight loss, pleurtic or exertional chest pain, orthopnea PND, or leg swelling   Preventive Screening-Counseling & Management  Alcohol-Tobacco     Smoking Status: current     Packs/Day: 1.0  Current Medications (verified): 1)  Potassium Chloride Crys Cr 20 Meq Cr-Tabs (Potassium Chloride Crys Cr) .... 2 Every Am and 4 Every Pm 2)  Advair Diskus 250-50 Mcg/dose  Misc (Fluticasone-Salmeterol) .... One Puff Two Times A Day 3)  Spiriva Handihaler 18 Mcg  Caps (Tiotropium  Bromide Monohydrate) .... One Capsule Once Daily 4)  Nasonex 50 Mcg/act  Susp (Mometasone Furoate) .... Two Puff Ea Nostril Once Daily As Needed 5)  Pletal 100 Mg  Tabs (Cilostazol) .... Take 1 Tablet By Mouth Once A Day 6)  Nexium 40 Mg  Cpdr (Esomeprazole Magnesium) .... By Mouth Daily. Take One Half Hour Before Eating. 7)  Mucinex 600 Mg  Xr12h-Tab (Guaifenesin) .... One By Mouth Two Times A Day 8)  Lipitor 20 Mg  Tabs (Atorvastatin Calcium) .... Once Daily 9)  Glucosamine 1500 Complex   Caps (Glucosamine-Chondroit-Vit C-Mn) .... Two Times A Day 10)  Cardizem 120 Mg Tabs (Diltiazem Hcl) .Marland Kitchen.. 1 Tab in Am 2 Tabs in Pm 11)  Fe-Caps 250 Mg Cr-Caps (Ferrous Sulfate) .... One Daily 12)  Xopenex Hfa 45 Mcg/act Aero (Levalbuterol Tartrate) .... 2 Puffs Every 6 Hours As Needed 13)  Furosemide 40 Mg Tabs (Furosemide) .... Take 2  Tab Two Times A Day 14)  Spironolactone 25 Mg Tabs (Spironolactone) .... Take One Tablet Once Daily 15)  Bayer Low Strength 81 Mg Tbec (Aspirin) .Marland Kitchen.. 1 By Mouth Daily 16)  Magnesium .Marland Kitchen.. 1 By Mouth Daiky 17)  Oxygen .... 2l At Bedtime 18)  Glipizide 5 Mg Tabs (Glipizide) .Marland Kitchen.. 1 1/2 Once Daily 19)  Fish Oil 1000 Mg Caps (Omega-3 Fatty Acids) .... Take 1 Capsule By Mouth  Two Times A Day 20)  Warfarin Sodium 5 Mg Tabs (Warfarin Sodium) .... Use As Directed By Anticoagulation Clinic 21)  Tikosyn 250 Mcg Caps (Dofetilide) .... Take One Capsule Every 12 Hours  Allergies (verified): No Known Drug Allergies  Past History:  Past medical, surgical, family and social histories (including risk factors) reviewed, and no changes noted (except as noted below).  Past Medical History: Reviewed history from 01/15/2009 and no changes required. HYPERSOMNIA, ASSOCIATED WITH SLEEP APNEA (ICD-780.53) Amiodarone Lung Toxicity RENAL DISEASE, CHRONIC  Creatinine 1.11 January 2009 CVA (ICD-434.91) DM (ICD-250.00) cigarette abuse  DIASTOLIC HEART FAILURE, ACUTE ON CHRONIC  (ICD-428.33) PACEMAKER, PERMANENT   ATRIAL FIBRILLATION (ICD-427.31)       -AV ablation 9/09 [facility] per Dr Sampson Goon  HYPERLIPIDEMIA (ICD-272.4) GERD (ICD-530.81) CORONARY ARTERY DISEASE not sure of htis 11/10 COPD (ICD-496)     -FeV1 73%  DLCO 53% 5/09  Past Surgical History: Reviewed history from 03/12/2009 and no changes required. Pulmonary vein isolation x2 once at [facility] and once at [facility]  Past Pulmonary History:  Pulmonary History: COPD     -FeV1 73%  DLCO 53% 5/09  Family History: Reviewed history from 06/01/2008 and no changes required. That mother died of unknown cause.  Father died   with heart disease.  The patient has two sisters who are alive and well.   No brothers.   Social History: Reviewed history from 06/01/2008 and no changes required. 1/2 cup decaff  [employer]ai-chi & water aerobics - garden, active lifestyle Retired  Married  Tobacco Use - Yes.  Alcohol Use - yes Packs/Day:  1.0  Review of Systems       The patient complains of shortness of breath with activity and non-productive cough.  The patient denies shortness of breath at rest, productive cough, coughing up blood, chest pain, irregular heartbeats, acid heartburn, indigestion, loss of appetite, weight change, abdominal pain, difficulty swallowing, sore throat, tooth/dental problems, headaches, nasal congestion/difficulty breathing through nose, sneezing, itching, ear ache, anxiety, depression, hand/feet swelling, joint stiffness or pain, rash, change in color of mucus, and fever.    Vital Signs:  Patient profile:   77 year old female Weight:      192.25 pounds O2 Sat:      96 % on Room air Temp:     98.1 degrees F oral Pulse rate:   76 / minute BP sitting:   100 / 60  (left arm)  Vitals Entered By: Vernie Murders (July 01, 2009 12:05 PM)  O2 Flow:  Room air  Physical Exam  Additional Exam:  Gen: Pleasant, well nourished, in no distress, on supp oxygen ENT: no lesions, no postnasal  drip Neck: No JVD, no TMG, no carotid bruits Lungs: no use of accessory muscles, no dullness to percussion, distant BS, poor airflow Cardiovascular: RRR, heart sounds normal, no murmurs or gallops, 1+  peripheral edema Musculoskeletal: No deformities, no cyanosis or clubbing  ABD:  increased ascites     Impression & Recommendations:  Problem # 1:  COPD (ICD-496) Assessment Unchanged  copd no changes, still ongoing tobacco abuse  plan cont inhaled meds  Medications Added to Medication List This Visit: 1)  Potassium Chloride Crys Cr 20 Meq Cr-tabs (Potassium chloride crys cr) .... 2 every am and 4 every pm  Complete Medication List: 1)  Potassium Chloride Crys Cr 20 Meq Cr-tabs (Potassium chloride crys cr) .... 2 every am and 4 every pm 2)  Advair Diskus 250-50 Mcg/dose Misc (Fluticasone-salmeterol) .... One puff two times  a day 3)  Spiriva Handihaler 18 Mcg Caps (Tiotropium bromide monohydrate) .... One capsule once daily 4)  Nasonex 50 Mcg/act Susp (Mometasone furoate) .... Two puff ea nostril once daily as needed 5)  Pletal 100 Mg Tabs (Cilostazol) .... Take 1 tablet by mouth once a day 6)  Nexium 40 Mg Cpdr (Esomeprazole magnesium) .... By mouth daily. take one half hour before eating. 7)  Mucinex 600 Mg Xr12h-tab (Guaifenesin) .... One by mouth two times a day 8)  Lipitor 20 Mg Tabs (Atorvastatin calcium) .... Once daily 9)  Glucosamine 1500 Complex Caps (Glucosamine-chondroit-vit c-mn) .... Two times a day 10)  Cardizem 120 Mg Tabs (Diltiazem hcl) .Marland Kitchen.. 1 tab in am 2 tabs in pm 11)  Fe-caps 250 Mg Cr-caps (Ferrous sulfate) .... One daily 12)  Xopenex Hfa 45 Mcg/act Aero (Levalbuterol tartrate) .... 2 puffs every 6 hours as needed 13)  Furosemide 40 Mg Tabs (Furosemide) .... Take 2  tab two times a day 14)  Spironolactone 25 Mg Tabs (Spironolactone) .... Take one tablet once daily 15)  Bayer Low Strength 81 Mg Tbec (Aspirin) .Marland Kitchen.. 1 by mouth daily 16)  Magnesium  .Marland KitchenMarland Kitchen. 1 by  mouth daiky 17)  Oxygen  .... 2l at bedtime 18)  Glipizide 5 Mg Tabs (Glipizide) .Marland Kitchen.. 1 1/2 once daily 19)  Fish Oil 1000 Mg Caps (Omega-3 fatty acids) .... Take 1 capsule by mouth two times a day 20)  Warfarin Sodium 5 Mg Tabs (Warfarin sodium) .... Use as directed by anticoagulation clinic 21)  Tikosyn 250 Mcg Caps (Dofetilide) .... Take one capsule every 12 hours  Other Orders: Est. Patient Level III (91478)  Patient Instructions: 1)  No change in medications 2)  Return 4 months Prescriptions: ADVAIR DISKUS 250-50 MCG/DOSE  MISC (FLUTICASONE-SALMETEROL) one puff two times a day  #3 x 4   Entered and Authorized by:   Storm Frisk MD   Signed by:   Storm Frisk MD on 07/01/2009   Method used:   Faxed to ...       [facility] (mail-order)       [address] [place], [place]  [address]       Ph: [phone]       Fax: [phone]   RxID:   [id number] SPIRIVA HANDIHALER 18 MCG  CAPS (TIOTROPIUM BROMIDE MONOHYDRATE) one capsule once daily Brand medically necessary #3 x 4   Entered and Authorized by:   Storm Frisk MD   Signed by:   Storm Frisk MD on 07/01/2009   Method used:   Faxed to ...       [facility]* (mail-order)       [address] [place], [place]  [address]       Ph: [phone]       Fax: [phone]   RxID:   [id number]   Prevention & Chronic Care Immunizations   Influenza vaccine: Fluvax 3+  (12/11/2008)    Tetanus booster: Not documented    Pneumococcal vaccine: Pneumovax  (12/19/2003)    H. zoster vaccine: Not documented  Colorectal Screening   Hemoccult: Not documented    Colonoscopy: Not documented  Other Screening   Pap smear: Not documented    Mammogram: Not documented    DXA bone density scan: Not documented   Smoking status: current  (07/01/2009)   Smoking cessation counseling: yes  (07/31/2008)  Diabetes Mellitus  HgbA1C: Not documented    Eye exam: Not  documented    Foot exam: Not documented   High risk foot: Not documented   Foot care education: Not documented    Urine microalbumin/creatinine ratio: Not documented  Lipids   Total Cholesterol: Not documented   LDL: Not documented   LDL Direct: Not documented   HDL: Not documented   Triglycerides: Not documented    SGOT (AST): 61  (02/16/2008)   SGPT (ALT): 66  (02/16/2008)   Alkaline phosphatase: 68  (02/16/2008)   Total bilirubin: 0.8  (02/16/2008)  Self-Management Support :    Diabetes self-management support: Not documented    Lipid self-management support: Not documented     Appended Document: Pulmonary OV fax ron polite

## 2010-04-10 NOTE — Progress Notes (Signed)
Summary: Pt request cal;l about B/P and pulse  Phone Note Call from Patient Call back at Home Phone [phone]   [name]: Patient Summary of Call: Pt had cardioverison yesterday and  b/p was 96/59 pulse 74  this morning B/P 102/73 pulse 112  pt request call Initial call taken by: Judie Grieve,  August 23, 2009 9:17 AM  Follow-up for Phone Call        Called patient and left message on machine Gypsy Balsam RN BSN  August 23, 2009 12:11 PM   Additional Follow-up for Phone Call Additional follow up Details #1::        Called patient and left message on machine [employer] RN BSN  August 26, 2009 8:35 AM

## 2010-04-10 NOTE — Medication Information (Signed)
Summary: [name]  Anticoagulant Therapy  Managed by: Rolland Porter, PharmD Referring MD: Sherryl Manges MD PCP: Dr [name] . Polite Supervising MD: Juanda Chance MD, Shigeko Manard Indication 1: Atrial Fibrillation (ICD-427.31) Lab Used: [facility] Site: [facility] INR POC 2.3 INR RANGE 2.0-3.0  Dietary changes: no    Health status changes: no    Bleeding/hemorrhagic complications: no    Recent/future hospitalizations: no    Any changes in medication regimen? no    Recent/future dental: no  Any missed doses?: no       Is patient compliant with meds? yes       Current Medications (verified): 1)  Potassium Chloride Crys Cr 20 Meq Cr-Tabs (Potassium Chloride Crys Cr) .... Take One Tablet By Mouth Daily 2)  Advair Diskus 250-50 Mcg/dose  Misc (Fluticasone-Salmeterol) .... One Puff Two Times A Day 3)  Spiriva Handihaler 18 Mcg  Caps (Tiotropium Bromide Monohydrate) .... One Capsule Once Daily 4)  Nasonex 50 Mcg/act  Susp (Mometasone Furoate) .... Two Puff Ea Nostril Once Daily As Needed 5)  Pletal 100 Mg  Tabs (Cilostazol) .... Take 1 Tablet By Mouth Once A Day 6)  Nexium 40 Mg  Cpdr (Esomeprazole Magnesium) .... By Mouth Daily. Take One Half Hour Before Eating. 7)  Mucinex 600 Mg  Xr12h-Tab (Guaifenesin) .... One By Mouth Two Times A Day 8)  Lipitor 20 Mg  Tabs (Atorvastatin Calcium) .... Once Daily 9)  Glucosamine 1500 Complex   Caps (Glucosamine-Chondroit-Vit C-Mn) .... Two Times A Day 10)  Cardizem 120 Mg Tabs (Diltiazem Hcl) .Marland Kitchen.. 1 Tab in Am 2 Tabs in Pm 11)  Fe-Caps 250 Mg Cr-Caps (Ferrous Sulfate) .... One Daily 12)  Xopenex Hfa 45 Mcg/act Aero (Levalbuterol Tartrate) .... 2 Puffs Every 6 Hours As Needed 13)  Furosemide 40 Mg Tabs (Furosemide) .... Take 2  Tab Two Times A Day 14)  Spironolactone 25 Mg Tabs (Spironolactone) .... Take One Tablet Once Daily 15)  Bayer Low Strength 81 Mg Tbec (Aspirin) .Marland Kitchen.. 1 By Mouth Daily 16)  Magnesium .Marland Kitchen.. 1 By Mouth Daiky 17)  Oxygen .... 2l At  Bedtime 18)  Glipizide 5 Mg Tabs (Glipizide) .Marland Kitchen.. 1 1/2 Once Daily 19)  Fish Oil 1000 Mg Caps (Omega-3 Fatty Acids) .... Take 1 Capsule By Mouth Two Times A Day 20)  Warfarin Sodium 5 Mg Tabs (Warfarin Sodium) .... Use As Directed By Anticoagulation Clinic 21)  Tikosyn 250 Mcg Caps (Dofetilide) .... Take One Capsule Every 12 Hours  Allergies (verified): No Known Drug Allergies  Anticoagulation Management History:      Positive risk factors for bleeding include an age of 58 years or older, history of CVA/TIA, and presence of serious comorbidities.  The bleeding index is 'high risk'.  Positive CHADS2 values include History of CHF, Age > 54 years old, History of Diabetes, and Prior Stroke/CVA/TIA.  The start date was 01/20/2004.  Her last INR was 2.2 RATIO.  Anticoagulation responsible provider: Juanda Chance MD, Smitty Cords.  INR POC: 2.3.  Cuvette Lot#: [id number].  Exp: 07/2010.    Anticoagulation Management Assessment/Plan:      The patient's current anticoagulation dose is Warfarin sodium 5 mg tabs: Use as directed by Anticoagulation Clinic.  The target INR is 2.0-3.0.  The next INR is due 07/15/2009.  Anticoagulation instructions were given to patient.  Results were reviewed/authorized by Rolland Porter, PharmD.  She was notified by Rolland Porter.         Prior Anticoagulation Instructions: INR 1.6. Take 2 tablets today and tomorrow, then  take 1.5 tablets daily except 1 tablet on Saturdays. Recheck in 10-14 days.  Current Anticoagulation Instructions: INR 2.3  Continue taking 1.5 tablets daily except 1 tablet on Saturday's.  Recheck in 3 weeks.

## 2010-04-10 NOTE — Progress Notes (Signed)
Summary: refill request  Phone Note Refill Request Message from:  Patient on November 08, 2009 2:58 PM  Refills Requested: Medication #1:  FUROSEMIDE 40 MG TABS take 2  tab two times a day pt needs 56 pills [facility] to last until her mail order comes in-pls ask them not to charge her insurance-she will pay out of pocket   Method Requested: Telephone to Pharmacy Initial call taken by: Glynda Jaeger,  November 08, 2009 2:59 PM  Follow-up for Phone Call        Called pharmacy and spoke to [place] who took the refill information and filled Rx for #56 with 0 refills and noted CASH on account so that insurance is not billed.   Follow-up by: Judithe Modest CMA,  November 08, 2009 3:55 PM

## 2010-04-11 NOTE — Letter (Signed)
Summary: Electrophysiology patient note-- Dr Macon Large  Electrophysiology patient note-- Dr Macon Large   Imported By: Kassie Mends 03/12/2009 12:10:45  _____________________________________________________________________  External Attachment:    Type:   Image     Comment:   External Document

## 2010-04-11 NOTE — Cardiovascular Report (Signed)
Summary: Office Visit   Office Visit   Imported By: Roderic Ovens 12/03/2009 15:06:25  _____________________________________________________________________  External Attachment:    Type:   Image     Comment:   External Document

## 2010-04-11 NOTE — Cardiovascular Report (Signed)
Summary: TTM   TTM   Imported By: Roderic Ovens 07/17/2009 16:08:11  _____________________________________________________________________  External Attachment:    Type:   Image     Comment:   External Document

## 2010-04-11 NOTE — Letter (Signed)
Summary: Discharge Summary  Discharge Summary   Imported By: Marylou Mccoy 04/24/2009 16:35:19  _____________________________________________________________________  External Attachment:    Type:   Image     Comment:   External Document

## 2010-04-14 ENCOUNTER — Encounter: Payer: Medicare Other | Admitting: Oncology

## 2010-04-14 ENCOUNTER — Encounter: Payer: Self-pay | Admitting: Internal Medicine

## 2010-04-14 ENCOUNTER — Other Ambulatory Visit: Payer: Self-pay | Admitting: Oncology

## 2010-04-14 ENCOUNTER — Ambulatory Visit: Payer: Medicare Other | Admitting: Critical Care Medicine

## 2010-04-14 DIAGNOSIS — R911 Solitary pulmonary nodule: Secondary | ICD-10-CM

## 2010-04-15 ENCOUNTER — Telehealth: Payer: Self-pay | Admitting: Critical Care Medicine

## 2010-04-18 ENCOUNTER — Encounter: Payer: Self-pay | Admitting: Cardiology

## 2010-04-18 ENCOUNTER — Encounter (INDEPENDENT_AMBULATORY_CARE_PROVIDER_SITE_OTHER): Payer: Medicare Other

## 2010-04-18 ENCOUNTER — Telehealth (INDEPENDENT_AMBULATORY_CARE_PROVIDER_SITE_OTHER): Payer: Self-pay | Admitting: *Deleted

## 2010-04-18 DIAGNOSIS — Z7901 Long term (current) use of anticoagulants: Secondary | ICD-10-CM

## 2010-04-18 DIAGNOSIS — I4891 Unspecified atrial fibrillation: Secondary | ICD-10-CM

## 2010-04-18 LAB — CONVERTED CEMR LAB: POC INR: 2.8

## 2010-04-24 NOTE — Progress Notes (Signed)
  Pt picked up all 6 [employer] Forms today.Marland KitchenMarland KitchenSee scanned document Erle Crocker  April 18, 2010 2:06 PM     _____________________________________________________________________  External Attachment:    Type:   Image     Comment:   External Document  Appended Document:  gave Camisha a copy of all signed forms...the Originals were mailed out today by me to  [facility] [address] [address]

## 2010-04-24 NOTE — Medication Information (Signed)
Summary: [facility]  Anticoagulant Therapy  Managed by: Cloyde Reams, RN, BSN Referring MD: Sherryl Manges MD PCP: Dr. Debby Bud Supervising MD: Daleen Squibb MD, Maisie Fus Indication 1: Atrial Fibrillation (ICD-427.31) Indication 2: Cerebrovascular Accident Lab Used: [facility] Site: [facility] INR POC 2.8 INR RANGE 2.0-3.0  Dietary changes: no    Health status changes: no    Bleeding/hemorrhagic complications: no    Recent/future hospitalizations: no    Any changes in medication regimen? no    Recent/future dental: no  Any missed doses?: no       Is patient compliant with meds? yes       Allergies: No Known Drug Allergies  Anticoagulation Management History:      The patient is taking warfarin and comes in today for a routine follow up visit.  Positive risk factors for bleeding include an age of 75 years or older, history of CVA/TIA, and presence of serious comorbidities.  The bleeding index is 'high risk'.  Positive CHADS2 values include History of CHF, Age > 50 years old, History of Diabetes, and Prior Stroke/CVA/TIA.  The start date was 01/20/2004.  Her last INR was 2.1.  Anticoagulation responsible provider: Daleen Squibb MD, Maisie Fus.  INR POC: 2.8.  Cuvette Lot#: [id number].  Exp: 03/2011.    Anticoagulation Management Assessment/Plan:      The patient's current anticoagulation dose is Warfarin sodium 5 mg tabs: Use as directed by Anticoagulation Clinic.  The target INR is 2.0-3.0.  The next INR is due 05/16/2010.  Anticoagulation instructions were given to patient.  Results were reviewed/authorized by Cloyde Reams, RN, BSN.  She was notified by Cloyde Reams RN.         Prior Anticoagulation Instructions: INR 2.6 (goal INR: 2-3)  Take 1 and 1/2 tablets except 1 tablet on Saturdays.  Recheck in 4 weeks.     Current Anticoagulation Instructions: INR 2.8  Continue on same dosage 1.5 tablets daily except 1 tablet on Saturdays.  Recheck in 4 weeks.

## 2010-04-24 NOTE — Procedures (Signed)
Summary: device/saf   Current Medications (verified): 1)  Potassium Chloride Crys Cr 20 Meq Cr-Tabs (Potassium Chloride Crys Cr) .... 2 Every Am and 3 Every Pm 2)  Advair Diskus 250-50 Mcg/dose  Misc (Fluticasone-Salmeterol) .... One Puff Two Times A Day 3)  Spiriva Handihaler 18 Mcg  Caps (Tiotropium Bromide Monohydrate) .... One Capsule Once Daily 4)  Pletal 100 Mg  Tabs (Cilostazol) .... Take 1 Tablet By Mouth Two Times A Day 5)  Nexium 40 Mg  Cpdr (Esomeprazole Magnesium) .... Take One Half Hour Before Eating. Take One Tablet By Mouth Every Other Day 6)  Mucinex 600 Mg  Xr12h-Tab (Guaifenesin) .... One By Mouth Two Times A Day 7)  Lipitor 20 Mg  Tabs (Atorvastatin Calcium) .... Once Daily 8)  Glucosamine 1500 Complex   Caps (Glucosamine-Chondroit-Vit C-Mn) .... Two Times A Day 9)  Fe-Caps 250 Mg Cr-Caps (Ferrous Sulfate) .... One Daily 10)  Xopenex Hfa 45 Mcg/act Aero (Levalbuterol Tartrate) .... 2 Puffs Every 6 Hours As Needed 11)  Furosemide 40 Mg Tabs (Furosemide) .... Take 2  Tab Two Times A Day 12)  Bayer Low Strength 81 Mg Tbec (Aspirin) .Marland Kitchen.. 1 By Mouth Daily 13)  Magnesium 250 Mg Tabs (Magnesium) .... Take 1 Tablet By Mouth Once A Day 14)  Glipizide 5 Mg Tabs (Glipizide) .Marland Kitchen.. 1 1/2 Once Daily 15)  Fish Oil 1000 Mg Caps (Omega-3 Fatty Acids) .... Take 1 Capsule By Mouth Two Times A Day 16)  Warfarin Sodium 5 Mg Tabs (Warfarin Sodium) .... Use As Directed By Anticoagulation Clinic 17)  Nasonex 50 Mcg/act Susp (Mometasone Furoate) .... 2 Sprays Each Nostril Once Daily 18)  Letrozole 2.5 Mg Tabs (Letrozole) .... Take 1 Tablet By Mouth Once Daily  Allergies (verified): No Known Drug Allergies   PPM Specifications Following MD:  Sherryl Manges, MD     PPM Vendor:  [employer]     PPM Model Number:  [id number]     PPM Serial Number:  [id number] PPM DOI:  01/23/2004     PPM Implanting MD:  Sherryl Manges, MD  Lead 1    Location: RA     DOI: 01/23/2004     Model #: [id number]     Serial #: [id number]      Status: active Lead 2    Location: RV     DOI: 01/23/2004     Model #: [id number]     Serial #: [id number]     Status: active  Magnet Response Rate:  BOL 98.6  Indications:  TACHY BRADY SYNDROME   PPM Follow Up Battery Voltage:  2.76 V     Battery Est. Longevity:  4.75-6 yrs     Pacer Dependent:  No     Right Ventricle  Amplitude: PACED mV, Impedance: 478 ohms, Threshold: 0.5 V at 0.5 msec  Episodes MS Episodes:  0     Coumadin:  Yes Ventricular High Rate:  0     Ventricular Pacing:  >99%  Parameters Mode:  VVIR     Lower Rate Limit:  70     Upper Rate Limit:  90 Paced AV Delay:  275     Sensed AV Delay:  250 Next Cardiology Appt Due:  10/08/2010 Tech Comments:  NORMAL DEVICE FUNCTION.  HISTOGRAM FLAT---TURNED RATE RESPONSE.  PT COMPLAINING OF SOB AND FATIGUE W/EXERCISE.  WALKED PT AND PER PT FELT A LITTLE BETTER WITH CHANGE.  CHANGED RECOVERY TIME FROM MEDIUM TO FAST.  ROV IN 6 MTHS W/SK. Vella Kohler  April 08, 2010 5:56 PM

## 2010-04-24 NOTE — Letter (Signed)
Summary: [facility]  [facility]   Imported By: Sherian Rein 04/17/2010 07:32:12  _____________________________________________________________________  External Attachment:    Type:   Image     Comment:   External Document

## 2010-04-24 NOTE — Letter (Signed)
Summary: Dr Estelle June Office Visit Note   Dr Estelle June Office Visit Note   Imported By: Roderic Ovens 04/17/2010 12:47:46  _____________________________________________________________________  External Attachment:    Type:   Image     Comment:   External Document

## 2010-04-24 NOTE — Letter (Signed)
Summary: Letter from patient  Letter from patient   Imported By: Lester Iuka 04/17/2010 08:49:55  _____________________________________________________________________  External Attachment:    Type:   Image     Comment:   External Document

## 2010-04-24 NOTE — Cardiovascular Report (Signed)
Summary: Office Visit   Office Visit   Imported By: Roderic Ovens 04/15/2010 15:57:58  _____________________________________________________________________  External Attachment:    Type:   Image     Comment:   External Document

## 2010-04-24 NOTE — Progress Notes (Signed)
Summary: nos appt  Phone Note Call from Patient   [name]: [email or web address]  Call For: Jacaria Colburn Summary of Call: Rsc nos from 2/6 to 3/16. Initial call taken by: Darletta Moll,  April 15, 2010 9:05 AM

## 2010-05-05 ENCOUNTER — Encounter: Payer: Self-pay | Admitting: Internal Medicine

## 2010-05-05 DIAGNOSIS — I6350 Cerebral infarction due to unspecified occlusion or stenosis of unspecified cerebral artery: Secondary | ICD-10-CM

## 2010-05-05 DIAGNOSIS — I4891 Unspecified atrial fibrillation: Secondary | ICD-10-CM

## 2010-05-12 ENCOUNTER — Encounter: Payer: Self-pay | Admitting: Internal Medicine

## 2010-05-12 DIAGNOSIS — I495 Sick sinus syndrome: Secondary | ICD-10-CM

## 2010-05-15 NOTE — Letter (Addendum)
Summary: Ruthann Cancer MD  Ruthann Cancer MD   Imported By: Lester [facility] 05/08/2010 08:33:38  _____________________________________________________________________  External Attachment:    Type:   Image     Comment:   External Document

## 2010-05-16 ENCOUNTER — Encounter: Payer: Self-pay | Admitting: Cardiology

## 2010-05-16 ENCOUNTER — Encounter (INDEPENDENT_AMBULATORY_CARE_PROVIDER_SITE_OTHER): Payer: Medicare Other

## 2010-05-16 DIAGNOSIS — Z7901 Long term (current) use of anticoagulants: Secondary | ICD-10-CM

## 2010-05-16 DIAGNOSIS — I4891 Unspecified atrial fibrillation: Secondary | ICD-10-CM

## 2010-05-16 DIAGNOSIS — I6789 Other cerebrovascular disease: Secondary | ICD-10-CM

## 2010-05-16 LAB — CONVERTED CEMR LAB: POC INR: 2.6

## 2010-05-20 NOTE — Progress Notes (Signed)
Summary: [facility]: Office Visit  [facility]: Office Visit   Imported By: Earl Many 05/09/2010 18:00:24  _____________________________________________________________________  External Attachment:    Type:   Image     Comment:   External Document

## 2010-05-20 NOTE — Medication Information (Signed)
Summary: rov/ewj  Anticoagulant Therapy  Managed by: Bethena Midget, RN, BSN Referring MD: Sherryl Manges MD PCP: Dr. Debby Bud Supervising MD: Riley Kill MD, Maisie Fus Indication 1: Atrial Fibrillation (ICD-427.31) Indication 2: Cerebrovascular Accident Lab Used: [facility] Site: [facility] INR POC 2.6 INR RANGE 2.0-3.0  Dietary changes: no    Health status changes: no    Bleeding/hemorrhagic complications: no    Recent/future hospitalizations: no    Any changes in medication regimen? yes       Details: Topical facial cream- Kerac  Recent/future dental: no  Any missed doses?: no       Is patient compliant with meds? yes       Allergies: No Known Drug Allergies  Anticoagulation Management History:      The patient is taking warfarin and comes in today for a routine follow up visit.  Positive risk factors for bleeding include an age of 75 years or older, history of CVA/TIA, and presence of serious comorbidities.  The bleeding index is 'high risk'.  Positive CHADS2 values include History of CHF, Age > 26 years old, History of Diabetes, and Prior Stroke/CVA/TIA.  The start date was 01/20/2004.  Her last INR was 2.1.  Anticoagulation responsible provider: Riley Kill MD, Maisie Fus.  INR POC: 2.6.  Cuvette Lot#: [id number].  Exp: 05/2010.    Anticoagulation Management Assessment/Plan:      The patient's current anticoagulation dose is Warfarin sodium 5 mg tabs: Use as directed by Anticoagulation Clinic.  The target INR is 2.0-3.0.  The next INR is due 06/12/2010.  Anticoagulation instructions were given to patient.  Results were reviewed/authorized by Bethena Midget, RN, BSN.  She was notified by Bethena Midget, RN, BSN.         Prior Anticoagulation Instructions: INR 2.8  Continue on same dosage 1.5 tablets daily except 1 tablet on Saturdays.  Recheck in 4 weeks.    Current Anticoagulation Instructions: INR 2.6 Continue 7.5mg s everyday except 5mg s on Saturdays. Recheck in 4 weeks.

## 2010-05-21 LAB — DIFFERENTIAL
Lymphs Abs: 2.6 10*3/uL (ref 0.7–4.0)
Monocytes Relative: 8 % (ref 3–12)
Neutro Abs: 6.2 10*3/uL (ref 1.7–7.7)
Neutrophils Relative %: 62 % (ref 43–77)

## 2010-05-21 LAB — COMPREHENSIVE METABOLIC PANEL
ALT: 20 U/L (ref 0–35)
Albumin: 3.4 g/dL — ABNORMAL LOW (ref 3.5–5.2)
BUN: 12 mg/dL (ref 6–23)
Calcium: 9.3 mg/dL (ref 8.4–10.5)
Glucose, Bld: 112 mg/dL — ABNORMAL HIGH (ref 70–99)
Sodium: 142 mEq/L (ref 135–145)
Total Protein: 6.6 g/dL (ref 6.0–8.3)

## 2010-05-21 LAB — APTT: aPTT: 39 seconds — ABNORMAL HIGH (ref 24–37)

## 2010-05-21 LAB — GLUCOSE, CAPILLARY: Glucose-Capillary: 85 mg/dL (ref 70–99)

## 2010-05-21 LAB — PROTIME-INR: INR: 1.72 — ABNORMAL HIGH (ref 0.00–1.49)

## 2010-05-21 LAB — CBC
HCT: 42.4 % (ref 36.0–46.0)
MCHC: 33.7 g/dL (ref 30.0–36.0)
Platelets: 216 10*3/uL (ref 150–400)
RDW: 16.2 % — ABNORMAL HIGH (ref 11.5–15.5)

## 2010-05-21 LAB — SURGICAL PCR SCREEN
MRSA, PCR: NEGATIVE
Staphylococcus aureus: NEGATIVE

## 2010-05-22 LAB — GLUCOSE, CAPILLARY: Glucose-Capillary: 126 mg/dL — ABNORMAL HIGH (ref 70–99)

## 2010-05-22 LAB — PROTIME-INR: INR: 1.05 (ref 0.00–1.49)

## 2010-05-23 ENCOUNTER — Ambulatory Visit: Payer: Medicare Other | Admitting: Critical Care Medicine

## 2010-05-23 LAB — PROTIME-INR
INR: 2.06 — ABNORMAL HIGH (ref 0.00–1.49)
Prothrombin Time: 23.4 seconds — ABNORMAL HIGH (ref 11.6–15.2)

## 2010-05-23 LAB — GLUCOSE, CAPILLARY: Glucose-Capillary: 127 mg/dL — ABNORMAL HIGH (ref 70–99)

## 2010-05-26 LAB — CBC
HCT: 39 % (ref 36.0–46.0)
Hemoglobin: 13.6 g/dL (ref 12.0–15.0)
MCHC: 34.7 g/dL (ref 30.0–36.0)
RDW: 16 % — ABNORMAL HIGH (ref 11.5–15.5)

## 2010-05-26 LAB — DIFFERENTIAL
Basophils Absolute: 0 10*3/uL (ref 0.0–0.1)
Eosinophils Relative: 2 % (ref 0–5)
Lymphocytes Relative: 24 % (ref 12–46)
Monocytes Absolute: 0.8 10*3/uL (ref 0.1–1.0)

## 2010-05-26 LAB — GLUCOSE, CAPILLARY: Glucose-Capillary: 125 mg/dL — ABNORMAL HIGH (ref 70–99)

## 2010-05-27 NOTE — Cardiovascular Report (Signed)
Summary: TTM   TTM   Imported By: Roderic Ovens 05/22/2010 15:49:07  _____________________________________________________________________  External Attachment:    Type:   Image     Comment:   External Document

## 2010-06-11 LAB — HM DIABETES EYE EXAM

## 2010-06-12 ENCOUNTER — Ambulatory Visit (INDEPENDENT_AMBULATORY_CARE_PROVIDER_SITE_OTHER): Payer: Medicare Other | Admitting: *Deleted

## 2010-06-12 DIAGNOSIS — I6350 Cerebral infarction due to unspecified occlusion or stenosis of unspecified cerebral artery: Secondary | ICD-10-CM

## 2010-06-12 DIAGNOSIS — Z7901 Long term (current) use of anticoagulants: Secondary | ICD-10-CM

## 2010-06-12 DIAGNOSIS — I4891 Unspecified atrial fibrillation: Secondary | ICD-10-CM

## 2010-06-15 LAB — PROTIME-INR
INR: 2.3 — ABNORMAL HIGH (ref 0.00–1.49)
Prothrombin Time: 24.8 seconds — ABNORMAL HIGH (ref 11.6–15.2)

## 2010-06-16 ENCOUNTER — Ambulatory Visit: Payer: Self-pay | Admitting: Internal Medicine

## 2010-06-17 LAB — PROTIME-INR: Prothrombin Time: 22.7 seconds — ABNORMAL HIGH (ref 11.6–15.2)

## 2010-06-18 ENCOUNTER — Other Ambulatory Visit: Payer: Self-pay | Admitting: Internal Medicine

## 2010-06-19 ENCOUNTER — Encounter: Payer: Self-pay | Admitting: Critical Care Medicine

## 2010-06-19 LAB — BLOOD GAS, ARTERIAL
Acid-base deficit: 1.2 mmol/L (ref 0.0–2.0)
Bicarbonate: 22.6 mEq/L (ref 20.0–24.0)
Drawn by: 296031
FIO2: 0.21 %
O2 Saturation: 93.3 %
Patient temperature: 98.6
TCO2: 23.7 mmol/L (ref 0–100)
pCO2 arterial: 35.8 mmHg (ref 35.0–45.0)
pH, Arterial: 7.418 — ABNORMAL HIGH (ref 7.350–7.400)
pO2, Arterial: 65.1 mmHg — ABNORMAL LOW (ref 80.0–100.0)

## 2010-06-19 LAB — BASIC METABOLIC PANEL
BUN: 17 mg/dL (ref 6–23)
BUN: 19 mg/dL (ref 6–23)
CO2: 29 mEq/L (ref 19–32)
Calcium: 8.4 mg/dL (ref 8.4–10.5)
Calcium: 9.1 mg/dL (ref 8.4–10.5)
Chloride: 105 mEq/L (ref 96–112)
Creatinine, Ser: 0.97 mg/dL (ref 0.4–1.2)
Creatinine, Ser: 0.99 mg/dL (ref 0.4–1.2)
GFR calc non Af Amer: 55 mL/min — ABNORMAL LOW (ref >60)

## 2010-06-19 LAB — CBC
MCHC: 33.9 g/dL (ref 30.0–36.0)
MCV: 91.1 fL (ref 78.0–100.0)
Platelets: 214 10*3/uL (ref 150–400)
WBC: 11.4 10*3/uL — ABNORMAL HIGH (ref 4.0–10.5)

## 2010-06-19 LAB — PROTIME-INR
INR: 1.9 — ABNORMAL HIGH (ref 0.00–1.49)
INR: 2.2 — ABNORMAL HIGH (ref 0.00–1.49)
Prothrombin Time: 22.4 seconds — ABNORMAL HIGH (ref 11.6–15.2)
Prothrombin Time: 25.4 seconds — ABNORMAL HIGH (ref 11.6–15.2)
Prothrombin Time: 30.6 seconds — ABNORMAL HIGH (ref 11.6–15.2)

## 2010-06-19 LAB — GLUCOSE, CAPILLARY
Glucose-Capillary: 184 mg/dL — ABNORMAL HIGH (ref 70–99)
Glucose-Capillary: 190 mg/dL — ABNORMAL HIGH (ref 70–99)
Glucose-Capillary: 37 mg/dL — CL (ref 70–99)

## 2010-06-19 LAB — VITAMIN B1: Vitamin B1 (Thiamine): 40 nmol/L (ref 9–44)

## 2010-06-19 LAB — VITAMIN B12: Vitamin B-12: 397 pg/mL (ref 211–911)

## 2010-06-19 LAB — MYASTHENIA GRAVIS PANEL 2: Acetylcholine Receptor Ab: 0 nmol/L (ref 0.0–0.4)

## 2010-06-19 LAB — ANGIOTENSIN CONVERTING ENZYME: Angiotensin-Converting Enzyme: 33 U/L (ref 9–67)

## 2010-06-19 LAB — SEDIMENTATION RATE: Sed Rate: 25 mm/hr — ABNORMAL HIGH (ref 0–22)

## 2010-06-20 ENCOUNTER — Encounter: Payer: Self-pay | Admitting: Critical Care Medicine

## 2010-06-20 ENCOUNTER — Ambulatory Visit (INDEPENDENT_AMBULATORY_CARE_PROVIDER_SITE_OTHER): Payer: Medicare Other | Admitting: Critical Care Medicine

## 2010-06-20 DIAGNOSIS — J449 Chronic obstructive pulmonary disease, unspecified: Secondary | ICD-10-CM

## 2010-06-20 MED ORDER — MOMETASONE FUROATE 50 MCG/ACT NA SUSP: 2.0000 | Freq: Every day | NASAL | Status: DC

## 2010-06-20 NOTE — Progress Notes (Signed)
Subjective:    Patient ID: [name], female    DOB: April 24, 1931, 77 y.o.   MRN: [id number]  HPI 77 y.o.   white female with history of chronic obstructive lung disease, asthmatic bronchitis, and obstructive sleep apnea. Active smoker and has failed smoking cessation on multiple attempts.   January 13, 2010 12:14 PM  Feels better now. Never coughed up much. No chest pain. Dyspnea is better vs 10/25.  Not inhaling as much cigarettes.  Pt denies any significant sore throat, nasal congestion or excess secretions, fever, chills, sweats, unintended weight loss, pleurtic or exertional chest pain, orthopnea PND, or leg swelling  Pt denies any increase in rescue therapy over baseline, denies waking up needing it or having any early am or nocturnal exacerbations of coughing/wheezing/or dyspnea.   06/20/2010 Copd f/u.   Since last ov,  Still has dyspnea on exertion,  Moves slowly,  No energy.  Still has cough,  Uses mucinex.  Mucus now is clear.   No chest pain.  Ablations failed for Afib. Has PPM.  Adjusted the PPM backup rate.  F/u with ONC.  No XRT or crx for breast ca.  Nodules on CT chest being followed  Past Medical History  Diagnosis Date  . CVA (cerebral vascular accident)   . Sleep apnea     associated with hypersomnia  . Amiodarone pulmonary toxicity   . Renal disease     CHRONIC  . Diabetes   . Tobacco abuse   . Diastolic heart failure     Acute on Chronic  . Pacemaker     Permanent  . AF (atrial fibrillation)      AV ablation 9/09 [facility] per Dr Sampson Goon - AV node ablation 9/11 Dr Graciela Husbands  . Hyperlipidemia   . GERD (gastroesophageal reflux disease)   . [name] (coronary artery disease)     (not sure of this 11/10  . COPD (chronic obstructive pulmonary disease)     emphysema -FeV1 73% DLCO 53% 5/09  . PAD (peripheral artery disease)     w/hx right iliac/SFA stenting and left leg PTA  . Invasive ductal carcinoma of breast 2011    LEFT   . OA (osteoarthritis) of knee    RIGHT  . Right sided sciatica      Family History  Problem Relation Age of Onset  . Heart disease Father      History   Social History  . Marital Status: Married    Spouse Name: N/A    Number of Children: N/A  . Years of Education: N/A   Occupational History  . Not on file.   Social History Main Topics  . Smoking status: Current Everyday Smoker -- 1.0 packs/day    Types: Cigarettes  . Smokeless tobacco: Never Used  . Alcohol Use: Yes     wine occasionally  . Drug Use: Not on file  . Sexually Active: Not on file   Other Topics Concern  . Not on file   Social History Narrative   Physician Roster:EP/card - Dr Lois Huxley - Dr Leda Min - Dr MagrinatNeph - Dr DeterdingDerm - Dr Romeo Apple TurnerOpthal - Dr Jacqulyn Bath - Dr Jeoffrey Massed - Dr Henrietta Hoover - Dr Doreen Beam HISTORYPulmonary vein isolation x 2. [facility] and [facility] 2 sisters who are alive and well. 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 RETIREDTai-chi & water aerobics - garden, active lifestyleMarriage a bit stressful - SO w/membory problems and difficult behavior. She denies any personal safety concerns.End of life care: need to address at next OV      No Known Allergies   Outpatient Prescriptions Prior to Visit  Medication Sig Dispense Refill  . aspirin 81 MG EC tablet Take 81 mg by mouth daily.        Marland Kitchen atorvastatin (LIPITOR) 20 MG tablet Take 20 mg by mouth daily.        . cilostazol (PLETAL) 100 MG tablet Take 100 mg by mouth 2 (two) times daily.        . Ferrous Sulfate (FE-CAPS) 250 MG CPCR Take 1 capsule by mouth daily.        . fish oil-omega-3 fatty acids 1000 MG capsule Take 1 g by mouth 2 (two) times daily.        . Fluticasone-Salmeterol  (ADVAIR DISKUS) 250-50 MCG/DOSE AEPB Inhale 1 puff into the lungs 2 (two) times daily.        . furosemide (LASIX) 40 MG tablet Take 80 mg by mouth 2 (two) times daily.        Marland Kitchen glipiZIDE (GLUCOTROL) 5 MG tablet Take 7.5 mg by mouth daily.        . Glucosamine-Chondroit-Vit C-Mn (GLUCOSAMINE 1500 COMPLEX) CAPS Take 1 capsule by mouth 2 (two) times daily.        Marland Kitchen guaiFENesin (MUCINEX) 600 MG 12 hr tablet Take 600 mg by mouth 2 (two) times daily.        Marland Kitchen levalbuterol (XOPENEX HFA) 45 MCG/ACT inhaler Inhale 2 puffs into the lungs every 6 (six) hours as needed.        . Magnesium 250 MG TABS Take 1 tablet by mouth daily.        . potassium chloride SA (K-DUR,KLOR-CON) 20 MEQ tablet Take 20 mEq by mouth as directed. 2 tabs every am and 3 every pm       . tiotropium (SPIRIVA HANDIHALER) 18 MCG inhalation capsule Place 18 mcg into inhaler and inhale daily.        Marland Kitchen warfarin (COUMADIN) 5 MG tablet Take by mouth as directed.       . mometasone (NASONEX) 50 MCG/ACT nasal spray 2 sprays by Nasal route daily.        Marland Kitchen enoxaparin (LOVENOX) 120 MG/0.8ML SOLN Inject into the skin as directed.           Review of Systems Constitutional:   No  weight loss, night sweats,  Fevers, chills, fatigue, lassitude. HEENT:   No headaches,  Difficulty swallowing,  Tooth/dental problems,  Sore throat,                No sneezing, itching, ear ache, nasal congestion, post nasal drip,   CV:  No chest pain,  Orthopnea, PND, swelling in lower extremities, anasarca, dizziness, palpitations  GI  No heartburn, indigestion, abdominal pain, nausea, vomiting, diarrhea, change in bowel habits, loss of appetite  Resp: Notes  shortness of breath with exertion not  at rest.  No excess mucus, notes  productive cough,  No non-productive cough,  No coughing up of blood.  No change in color of mucus.  No wheezing.  No chest wall deformity  Skin: no rash or lesions.  GU: no dysuria, change in color of urine, no urgency or frequency.   No flank pain.  MS:  No  joint pain or swelling.  No decreased range of motion.  No back pain.  Psych:  No change in mood or affect. No depression or anxiety.  No memory loss.     Objective:   Physical Exam Gen: Pleasant, well-nourished, in no distress,  normal affect  ENT: No lesions,  mouth clear,  oropharynx clear, no postnasal drip  Neck: No JVD, no TMG, no carotid bruits  Lungs: No use of accessory muscles, no dullness to percussion, distant bs, clear Cardiovascular: RRR, heart sounds normal, no murmur or gallops, no peripheral edema  Abdomen: soft and NT, no HSM,  BS normal  Musculoskeletal: No deformities, no cyanosis or clubbing  Neuro: alert, non focal  Skin: Warm, no lesions or rashes        Assessment & Plan:   COPD Stable copd Plan No change in medications. Return in          4 mo         Updated Medication List Outpatient Encounter Prescriptions as of 06/20/2010  Medication Sig Dispense Refill  . aspirin 81 MG EC tablet Take 81 mg by mouth daily.        Marland Kitchen atorvastatin (LIPITOR) 20 MG tablet Take 20 mg by mouth daily.        . cilostazol (PLETAL) 100 MG tablet Take 100 mg by mouth 2 (two) times daily.        . Ferrous Sulfate (FE-CAPS) 250 MG CPCR Take 1 capsule by mouth daily.        . fish oil-omega-3 fatty acids 1000 MG capsule Take 1 g by mouth 2 (two) times daily.        . Fluticasone-Salmeterol (ADVAIR DISKUS) 250-50 MCG/DOSE AEPB Inhale 1 puff into the lungs 2 (two) times daily.        . furosemide (LASIX) 40 MG tablet Take 80 mg by mouth 2 (two) times daily.        Marland Kitchen glipiZIDE (GLUCOTROL) 5 MG tablet Take 7.5 mg by mouth daily.        . Glucosamine-Chondroit-Vit C-Mn (GLUCOSAMINE 1500 COMPLEX) CAPS Take 1 capsule by mouth 2 (two) times daily.        Marland Kitchen guaiFENesin (MUCINEX) 600 MG 12 hr tablet Take 600 mg by mouth 2 (two) times daily.        Marland Kitchen letrozole (FEMARA) 2.5 MG tablet Take 1 tablet by mouth Daily.      Marland Kitchen levalbuterol (XOPENEX HFA) 45  MCG/ACT inhaler Inhale 2 puffs into the lungs every 6 (six) hours as needed.        . Magnesium 250 MG TABS Take 1 tablet by mouth daily.        . mometasone (NASONEX) 50 MCG/ACT nasal spray 2 sprays by Nasal route daily.  51 g  4  . potassium chloride SA (K-DUR,KLOR-CON) 20 MEQ tablet Take 20 mEq by mouth as directed. 2 tabs every am and 3 every pm       . tiotropium (SPIRIVA HANDIHALER) 18 MCG inhalation capsule Place 18 mcg into inhaler and inhale daily.        Marland Kitchen warfarin (COUMADIN) 5 MG tablet Take by mouth as directed.       Marland Kitchen DISCONTD: mometasone (NASONEX) 50 MCG/ACT nasal spray 2 sprays by Nasal route daily.        Marland Kitchen DISCONTD: enoxaparin (LOVENOX) 120 MG/0.8ML SOLN Inject into the skin as directed.

## 2010-06-20 NOTE — Patient Instructions (Signed)
No change in medications Refill on [facility]onex [facility] Return 4 months

## 2010-06-20 NOTE — Assessment & Plan Note (Signed)
Stable copd Plan No change in medications. Return in          4 mo

## 2010-06-24 ENCOUNTER — Ambulatory Visit (INDEPENDENT_AMBULATORY_CARE_PROVIDER_SITE_OTHER): Payer: Medicare Other | Admitting: Internal Medicine

## 2010-06-24 ENCOUNTER — Encounter: Payer: Self-pay | Admitting: Internal Medicine

## 2010-06-24 ENCOUNTER — Ambulatory Visit (INDEPENDENT_AMBULATORY_CARE_PROVIDER_SITE_OTHER)
Admission: RE | Admit: 2010-06-24 | Discharge: 2010-06-24 | Disposition: A | Discharge status: Home / Self Care | Payer: Medicare Other | Source: Ambulatory Visit | Attending: Internal Medicine | Admitting: Internal Medicine

## 2010-06-24 VITALS — BP 100/56 | HR 70 | Temp 97.6°F | Wt 177.0 lb

## 2010-06-24 DIAGNOSIS — F17200 Nicotine dependence, unspecified, uncomplicated: Secondary | ICD-10-CM

## 2010-06-24 DIAGNOSIS — M1710 Unilateral primary osteoarthritis, unspecified knee: Secondary | ICD-10-CM

## 2010-06-24 LAB — BASIC METABOLIC PANEL
BUN: 11 mg/dL (ref 6–23)
BUN: 12 mg/dL (ref 6–23)
BUN: 13 mg/dL (ref 6–23)
BUN: 13 mg/dL (ref 6–23)
CO2: 26 mEq/L (ref 19–32)
CO2: 28 mEq/L (ref 19–32)
Calcium: 8.4 mg/dL (ref 8.4–10.5)
Calcium: 8.5 mg/dL (ref 8.4–10.5)
Calcium: 8.5 mg/dL (ref 8.4–10.5)
Calcium: 8.6 mg/dL (ref 8.4–10.5)
Chloride: 105 mEq/L (ref 96–112)
Chloride: 105 mEq/L (ref 96–112)
Chloride: 106 mEq/L (ref 96–112)
Creatinine, Ser: 0.8 mg/dL (ref 0.4–1.2)
Creatinine, Ser: 0.9 mg/dL (ref 0.4–1.2)
Creatinine, Ser: 0.94 mg/dL (ref 0.4–1.2)
Creatinine, Ser: 1.01 mg/dL (ref 0.4–1.2)
Creatinine, Ser: 1.02 mg/dL (ref 0.4–1.2)
Creatinine, Ser: 1.07 mg/dL (ref 0.4–1.2)
GFR calc Af Amer: >60 mL/min (ref >60)
GFR calc Af Amer: >60 mL/min (ref >60)
GFR calc Af Amer: >60 mL/min (ref >60)
GFR calc Af Amer: >60 mL/min (ref >60)
GFR calc Af Amer: >60 mL/min (ref >60)
GFR calc non Af Amer: 50 mL/min — ABNORMAL LOW (ref >60)
GFR calc non Af Amer: 58 mL/min — ABNORMAL LOW (ref >60)
GFR calc non Af Amer: >60 mL/min (ref >60)
Glucose, Bld: 167 mg/dL — ABNORMAL HIGH (ref 70–99)
Glucose, Bld: 89 mg/dL (ref 70–99)
Potassium: 3.7 mEq/L (ref 3.5–5.1)
Potassium: 4 mEq/L (ref 3.5–5.1)
Potassium: 4.1 mEq/L (ref 3.5–5.1)
Sodium: 140 mEq/L (ref 135–145)

## 2010-06-24 LAB — PROTIME-INR
INR: 1.6 — ABNORMAL HIGH (ref 0.00–1.49)
INR: 1.7 — ABNORMAL HIGH (ref 0.00–1.49)
INR: 1.7 — ABNORMAL HIGH (ref 0.00–1.49)
INR: 1.8 — ABNORMAL HIGH (ref 0.00–1.49)
INR: 2.3 — ABNORMAL HIGH (ref 0.00–1.49)
Prothrombin Time: 18.1 seconds — ABNORMAL HIGH (ref 11.6–15.2)
Prothrombin Time: 20.4 seconds — ABNORMAL HIGH (ref 11.6–15.2)
Prothrombin Time: 20.5 seconds — ABNORMAL HIGH (ref 11.6–15.2)
Prothrombin Time: 21.2 seconds — ABNORMAL HIGH (ref 11.6–15.2)
Prothrombin Time: 22.2 seconds — ABNORMAL HIGH (ref 11.6–15.2)
Prothrombin Time: 25.5 seconds — ABNORMAL HIGH (ref 11.6–15.2)

## 2010-06-24 LAB — GLUCOSE, CAPILLARY
Glucose-Capillary: 105 mg/dL — ABNORMAL HIGH (ref 70–99)
Glucose-Capillary: 109 mg/dL — ABNORMAL HIGH (ref 70–99)
Glucose-Capillary: 113 mg/dL — ABNORMAL HIGH (ref 70–99)
Glucose-Capillary: 122 mg/dL — ABNORMAL HIGH (ref 70–99)
Glucose-Capillary: 124 mg/dL — ABNORMAL HIGH (ref 70–99)
Glucose-Capillary: 129 mg/dL — ABNORMAL HIGH (ref 70–99)
Glucose-Capillary: 141 mg/dL — ABNORMAL HIGH (ref 70–99)
Glucose-Capillary: 141 mg/dL — ABNORMAL HIGH (ref 70–99)
Glucose-Capillary: 152 mg/dL — ABNORMAL HIGH (ref 70–99)
Glucose-Capillary: 152 mg/dL — ABNORMAL HIGH (ref 70–99)
Glucose-Capillary: 166 mg/dL — ABNORMAL HIGH (ref 70–99)
Glucose-Capillary: 170 mg/dL — ABNORMAL HIGH (ref 70–99)
Glucose-Capillary: 276 mg/dL — ABNORMAL HIGH (ref 70–99)
Glucose-Capillary: 96 mg/dL (ref 70–99)

## 2010-06-24 LAB — LIPID PANEL
Cholesterol: 131 mg/dL (ref 0–200)
HDL: 48 mg/dL (ref >39)
LDL Cholesterol: 66 mg/dL (ref 0–99)
Total CHOL/HDL Ratio: 2.7 RATIO
VLDL: 17 mg/dL (ref 0–40)

## 2010-06-24 LAB — CBC
HCT: 32.3 % — ABNORMAL LOW (ref 36.0–46.0)
HCT: 33.9 % — ABNORMAL LOW (ref 36.0–46.0)
Hemoglobin: 11.1 g/dL — ABNORMAL LOW (ref 12.0–15.0)
Hemoglobin: 11.7 g/dL — ABNORMAL LOW (ref 12.0–15.0)
Hemoglobin: 13.6 g/dL (ref 12.0–15.0)
MCHC: 34 g/dL (ref 30.0–36.0)
MCHC: 34.5 g/dL (ref 30.0–36.0)
MCHC: 34.9 g/dL (ref 30.0–36.0)
MCV: 89 fL (ref 78.0–100.0)
MCV: 89.9 fL (ref 78.0–100.0)
MCV: 90.3 fL (ref 78.0–100.0)
Platelets: 167 10*3/uL (ref 150–400)
Platelets: 171 10*3/uL (ref 150–400)
RBC: 3.58 MIL/uL — ABNORMAL LOW (ref 3.87–5.11)
RBC: 3.62 MIL/uL — ABNORMAL LOW (ref 3.87–5.11)
RBC: 3.77 MIL/uL — ABNORMAL LOW (ref 3.87–5.11)
RBC: 3.78 MIL/uL — ABNORMAL LOW (ref 3.87–5.11)
RBC: 3.81 MIL/uL — ABNORMAL LOW (ref 3.87–5.11)
RBC: 4.05 MIL/uL (ref 3.87–5.11)
RBC: 4.39 MIL/uL (ref 3.87–5.11)
RDW: 18.1 % — ABNORMAL HIGH (ref 11.5–15.5)
RDW: 18.7 % — ABNORMAL HIGH (ref 11.5–15.5)
WBC: 10 10*3/uL (ref 4.0–10.5)
WBC: 7.9 10*3/uL (ref 4.0–10.5)
WBC: 8.5 10*3/uL (ref 4.0–10.5)
WBC: 9.5 10*3/uL (ref 4.0–10.5)

## 2010-06-24 LAB — DIFFERENTIAL
Eosinophils Absolute: 0.1 10*3/uL (ref 0.0–0.7)
Lymphocytes Relative: 14 % (ref 12–46)
Lymphs Abs: 1.5 10*3/uL (ref 0.7–4.0)
Neutrophils Relative %: 77 % (ref 43–77)

## 2010-06-24 LAB — MAGNESIUM
Magnesium: 2.4 mg/dL (ref 1.5–2.5)
Magnesium: 2.4 mg/dL (ref 1.5–2.5)
Magnesium: 2.4 mg/dL (ref 1.5–2.5)
Magnesium: 2.5 mg/dL (ref 1.5–2.5)

## 2010-06-24 LAB — COMPREHENSIVE METABOLIC PANEL
ALT: 25 U/L (ref 0–35)
CO2: 26 mEq/L (ref 19–32)
CO2: 28 mEq/L (ref 19–32)
Calcium: 8.5 mg/dL (ref 8.4–10.5)
Calcium: 8.9 mg/dL (ref 8.4–10.5)
Creatinine, Ser: 1.08 mg/dL (ref 0.4–1.2)
GFR calc non Af Amer: 50 mL/min — ABNORMAL LOW (ref >60)
GFR calc non Af Amer: 55 mL/min — ABNORMAL LOW (ref >60)
Glucose, Bld: 222 mg/dL — ABNORMAL HIGH (ref 70–99)
Glucose, Bld: 95 mg/dL (ref 70–99)
Sodium: 139 mEq/L (ref 135–145)

## 2010-06-24 LAB — BRAIN NATRIURETIC PEPTIDE: Pro B Natriuretic peptide (BNP): 160 pg/mL — ABNORMAL HIGH (ref 0.0–100.0)

## 2010-06-24 LAB — HEMOGLOBIN A1C: Mean Plasma Glucose: 134 mg/dL

## 2010-06-24 LAB — TROPONIN I: Troponin I: 0.01 ng/mL (ref 0.00–0.06)

## 2010-06-24 LAB — CARDIAC PANEL(CRET KIN+CKTOT+MB+TROPI)
CK, MB: 2.2 ng/mL (ref 0.3–4.0)
CK, MB: 3.9 ng/mL (ref 0.3–4.0)
Relative Index: INVALID (ref 0.0–2.5)
Total CK: 45 U/L (ref 7–177)
Total CK: 69 U/L (ref 7–177)
Troponin I: <0.01 ng/mL (ref 0.00–0.06)
Troponin I: <0.01 ng/mL (ref 0.00–0.06)

## 2010-06-24 LAB — SODIUM, URINE, RANDOM: Sodium, Ur: <10 mEq/L

## 2010-06-24 LAB — APTT: aPTT: 46 seconds — ABNORMAL HIGH (ref 24–37)

## 2010-06-24 LAB — URINALYSIS, ROUTINE W REFLEX MICROSCOPIC
Bilirubin Urine: NEGATIVE
Hgb urine dipstick: NEGATIVE
Ketones, ur: NEGATIVE mg/dL
Nitrite: NEGATIVE
pH: 5.5 (ref 5.0–8.0)

## 2010-06-24 LAB — CK TOTAL AND CKMB (NOT AT ARMC): CK, MB: 2.1 ng/mL (ref 0.3–4.0)

## 2010-06-24 LAB — DIGOXIN LEVEL: Digoxin Level: <0.2 ng/mL — ABNORMAL LOW (ref 0.8–2.0)

## 2010-06-24 MED ORDER — GABAPENTIN 100 MG PO CAPS: 100.0000 mg | ORAL_CAPSULE | Freq: Every day | ORAL | Status: DC

## 2010-06-24 NOTE — Progress Notes (Signed)
  Subjective:    Patient ID: [name], female    DOB: [birth year], 77 y.o.   MRN: [id number]  HPIPatient presents for follow-up. 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 She has also had a facial peel. She did have a DXA scan in Jan '12 and had nl lumbar spine density; she had mild osteopenia femoral neck at -1.9.  She has intermittent pain in the right leg. The pain is in the anterior thigh and medial knee. She denies frank hip pain. She has had PCI/stent placement for Claudication with good results. She has had depo-medrol  injections right knee for DJD knee x 2 by Dr. Charlett Blake (notes reviewed). She denies any h/o hip disease.   She continues to smoke.  PMH, FamHx and SocHx reviewed for any changes and relevance.     Review of Systems  Constitutional: Negative for fever, chills, activity change, fatigue and unexpected weight change.  HENT: Negative for hearing loss, congestion, rhinorrhea, neck pain and tinnitus.   Eyes: Negative.   Respiratory: Positive for cough and shortness of breath. Negative for apnea, choking, chest tightness and stridor.   Cardiovascular: Negative.  Negative for chest pain, palpitations and leg swelling.  Gastrointestinal: Negative.   Genitourinary: Negative.   Musculoskeletal:       Leg pain - myalgias  Neurological: Negative for dizziness, tremors, weakness and light-headedness.  Hematological: Negative.   Psychiatric/Behavioral: Negative.        Objective:   Physical Exam  Constitutional: She is oriented to person, place, and time. She appears well-developed and well-nourished. No distress.  HENT:  Head: Normocephalic and atraumatic.  Eyes: Conjunctivae and EOM are normal. Pupils are equal, round, and reactive to light.  Neck: Neck supple.  Cardiovascular: Normal rate and regular rhythm.   Pulmonary/Chest: Effort normal. She has no wheezes.  Musculoskeletal:       normal range of motion right knee  without effusion, erythema or swelling. Tender to palpation along the sartorious and insertion, vastus medialis and insertion. Good range of motion right hip without tenderness to internal or external rotation. Nl strength - ability to rise and walk.  Neurological: She is alert and oriented to person, place, and time. She has normal reflexes.  Skin: Skin is warm. No rash noted. No erythema.  Psychiatric: She has a normal mood and affect. Her behavior is normal. Thought content normal.          Assessment & Plan:  1. Leg pain right - tenderness in the muscles of the joint. Concerned for strain due to gait abnormality due to either knee or hip dysfunction. No weakness or paresthesia noted.  Plan - Hip x-ray           lineament of choice           May increase gabapentin           PT/massage therapy  Addendum - x-ray show no joint abnormality hips/pelvis  Favor PT/massage and will refer to patient's place of choice. (order placed)  2. Tobacco addiction - counseled Ms. Bunning on smoking cessation - the General Schwartzkopf approach of total planning. She listened but is not interested in stopping smoking at this time (greater than 10 minutes in counseling).

## 2010-06-25 ENCOUNTER — Encounter: Payer: Self-pay | Admitting: Internal Medicine

## 2010-06-30 ENCOUNTER — Telehealth: Payer: Self-pay

## 2010-06-30 NOTE — Telephone Encounter (Signed)
Patient called [facility]mov[facility] requesting a call back regarding a letter from last office visit. Per Patient she wants MD to call her about it "because it needs some corrections". I could not find any letters sent out to patient since last office visit. Please advise, does this sound familiar ?

## 2010-06-30 NOTE — Telephone Encounter (Signed)
Must be copy of last office note that was mailed to her. Please help and call her for the corrections.

## 2010-06-30 NOTE — Telephone Encounter (Signed)
Pt states she does have swelling in her legs and ankles when she eats salt.  She states she has bad tremors in both hands Pt also mentioned that she does have to push off and chairs to get up. She also wanted your suggestion on taking an OTC pill called Niacinanide for Alzheimer's Please Advise. The rest are "corrections" from pt

## 2010-06-30 NOTE — Telephone Encounter (Signed)
I don''t know anything about niacinanide for alzheimer's.

## 2010-07-02 NOTE — Telephone Encounter (Signed)
Informed pt .

## 2010-07-03 ENCOUNTER — Ambulatory Visit (INDEPENDENT_AMBULATORY_CARE_PROVIDER_SITE_OTHER): Payer: Medicare Other | Admitting: *Deleted

## 2010-07-03 DIAGNOSIS — I4891 Unspecified atrial fibrillation: Secondary | ICD-10-CM

## 2010-07-03 DIAGNOSIS — I6350 Cerebral infarction due to unspecified occlusion or stenosis of unspecified cerebral artery: Secondary | ICD-10-CM

## 2010-07-14 ENCOUNTER — Encounter (HOSPITAL_BASED_OUTPATIENT_CLINIC_OR_DEPARTMENT_OTHER): Payer: Medicare Other | Admitting: Oncology

## 2010-07-14 ENCOUNTER — Ambulatory Visit (HOSPITAL_COMMUNITY)
Admission: RE | Admit: 2010-07-14 | Discharge: 2010-07-14 | Disposition: A | Discharge status: Home / Self Care | Payer: Medicare Other | Source: Ambulatory Visit | Attending: Oncology | Admitting: Oncology

## 2010-07-14 ENCOUNTER — Other Ambulatory Visit: Payer: Self-pay | Admitting: Oncology

## 2010-07-14 ENCOUNTER — Encounter (HOSPITAL_COMMUNITY): Payer: Self-pay

## 2010-07-14 DIAGNOSIS — J984 Other disorders of lung: Secondary | ICD-10-CM | POA: Insufficient documentation

## 2010-07-14 DIAGNOSIS — R911 Solitary pulmonary nodule: Secondary | ICD-10-CM

## 2010-07-14 DIAGNOSIS — C50919 Malignant neoplasm of unspecified site of unspecified female breast: Secondary | ICD-10-CM | POA: Insufficient documentation

## 2010-07-14 DIAGNOSIS — R599 Enlarged lymph nodes, unspecified: Secondary | ICD-10-CM | POA: Insufficient documentation

## 2010-07-14 LAB — CBC WITH DIFFERENTIAL/PLATELET
Eosinophils Absolute: 0.2 10*3/uL (ref 0.0–0.5)
MONO#: 0.7 10*3/uL (ref 0.1–0.9)
NEUT#: 5.8 10*3/uL (ref 1.5–6.5)
Platelets: 178 10*3/uL (ref 145–400)
RBC: 4.86 10*6/uL (ref 3.70–5.45)
RDW: 15.8 % — ABNORMAL HIGH (ref 11.2–14.5)
WBC: 8.9 10*3/uL (ref 3.9–10.3)
lymph#: 2.1 10*3/uL (ref 0.9–3.3)

## 2010-07-14 LAB — BUN & CREATININE (CHCC)
BUN, Bld: 18 mg/dL (ref 7–22)
Creat: 1 mg/dl (ref 0.6–1.2)

## 2010-07-14 MED ORDER — IOHEXOL 300 MG/ML  SOLN
80.0000 mL | Freq: Once | INTRAMUSCULAR | Status: AC | PRN
  Administered 2010-07-14: 80 mL via INTRAVENOUS

## 2010-07-18 ENCOUNTER — Ambulatory Visit (INDEPENDENT_AMBULATORY_CARE_PROVIDER_SITE_OTHER): Payer: Medicare Other | Admitting: *Deleted

## 2010-07-18 ENCOUNTER — Encounter: Payer: Self-pay | Admitting: *Deleted

## 2010-07-18 DIAGNOSIS — I4891 Unspecified atrial fibrillation: Secondary | ICD-10-CM

## 2010-07-18 DIAGNOSIS — I6350 Cerebral infarction due to unspecified occlusion or stenosis of unspecified cerebral artery: Secondary | ICD-10-CM

## 2010-07-18 LAB — POCT INR: INR: 2.5

## 2010-07-21 ENCOUNTER — Encounter (HOSPITAL_BASED_OUTPATIENT_CLINIC_OR_DEPARTMENT_OTHER): Payer: Medicare Other | Admitting: Oncology

## 2010-07-21 DIAGNOSIS — Z170 Estrogen receptor positive status [ER+]: Secondary | ICD-10-CM

## 2010-07-21 DIAGNOSIS — C50919 Malignant neoplasm of unspecified site of unspecified female breast: Secondary | ICD-10-CM

## 2010-07-21 DIAGNOSIS — F17200 Nicotine dependence, unspecified, uncomplicated: Secondary | ICD-10-CM

## 2010-07-21 DIAGNOSIS — M899 Disorder of bone, unspecified: Secondary | ICD-10-CM

## 2010-07-22 NOTE — Assessment & Plan Note (Signed)
[facility]                         ELECTROPHYSIOLOGY OFFICE NOTE   NAME:[name], [name]                     MRN:          [id number]  DATE:03/27/2008                            DOB:          1931/06/17    HISTORY OF PRESENT ILLNESS:  [name] is seen in followup for atrial  fibrillation, for which she underwent catheter ablation by Dr.  Sampson Goon at the end of the summer.  She comes back now with out  symptoms, but is noted on electrocardiogram to be in atrial flutter,  dating back 10 days.  She has had no worsening of her symptoms.  She had  noted improvement in her symptoms following her catheter ablation.  She  has been intercurrently hospitalized for pneumonia and bronchitis, which  has gradually improved.  She continues to smoke.   CURRENT MEDICATIONS:  Includes  1. Spiriva.  2. Coumadin.  3. Digoxin.  4. Cilostazol.  5. Furosemide.  6. Lipitor.  7. Amiodarone, if these notes are correct, but the amiodarone was to      be stopped in hospital.   PHYSICAL EXAMINATION:  VITAL SIGNS:  Today her blood pressure 110/70,  her pulse was 109 and irregular.  Weight was 180, which is stable.  LUNGS:  Clear.  HEART:  Sounds were irregular without significant murmurs.  NECK:  There is no venous distention, although there was hepatojugular  reflux.  ABDOMEN:  There is no abdominal swelling.  EXTREMITIES:  No edema.   Interrogation of her device demonstrated the onset of the atrial  arrhythmias noted on the March 18, 2008.  Heart rate during mode  switch is unfortunately not tracked.   IMPRESSION:  1. Atrial fibrillation status post ablation.  2. Atrial flutter status post atrial fibrillation.  3. Therapeutic Coumadin level.  4. Amiodarone therapy.  5. Bradycardia status post pacemaker implantation.  6. Chronic obstructive pulmonary disease/recent pneumonia with ongoing      smoking.   [name] is out of rhythm.  I would recommend that  we undertake  cardioversion sooner rather than later.  There is a 3-6 month window  wherein recurrent arrhythmias are not frequently seen following catheter  ablation.  At this late day, I am not altogether sanguine that we will  not have problems with recurrent flutters, but I think cardioversion at  this point would be the right thing to  do.  She has been therapeutic since mid December.  We will plan to  undertake at her earliest convenience.   We need to confirm whether amiodarone is current medications.     Duke Salvia, MD, [facility]  Electronically Signed    SCK/MedQ  DD: 03/28/2008  DT: 03/29/2008  Job #: [id number]

## 2010-07-22 NOTE — Assessment & Plan Note (Signed)
[facility]                             PULMONARY OFFICE NOTE   NAME:TURNERReida, [name]                       MRN:          [id number]  DATE:10/29/2006                            DOB:          [birth year]    [name] is a 77 year old white female with a history of chronic  obstructive lung disease, asthmatic bronchitis, obstructive sleep apnea,  and still actively smoking. She failed Chantix. Her shortness of breath  is stable.   She is maintained on:  1. Spiriva daily.  2. Advair 250/50 1 spray b.i.d.  3. Nasonex daily.   PHYSICAL EXAMINATION:  VITAL SIGNS:  Temperature 98, blood pressure  100/60, pulse 90, saturation 94% on room air.  CHEST:  Distant breath sounds with prolonged expiratory phase. No wheeze  or rhonchi were noted.  CARDIAC:  Regular rate and rhythm without S3, normal S1, S2.  ABDOMEN:  Soft and nontender.  EXTREMITIES:  No edema or clubbing.  SKIN:  Clear.   IMPRESSION:  Chronic obstructive lung disease with an asthmatic  bronchitic component, still actively smoking.   PLAN:  Maintain inhaled medicines as currently dose. No change in plan  of care was made. She was given a refill on Nasonex.     Charlcie Cradle Delford Field, MD, [facility]  Electronically Signed    PEW/MedQ  DD: 10/29/2006  DT: 10/31/2006  Job #: [id number]   cc:   Ladell Pier, M.D.

## 2010-07-22 NOTE — Assessment & Plan Note (Signed)
[name]                         ELECTROPHYSIOLOGY OFFICE NOTE   NAME:[name], [name]                       MRN:          [id number]  DATE:03/22/2007                            DOB:          May 28, 1931    [name] has atrial fibrillation and bradycardia.  He takes Tikosyn,  and she comes in without complaints.   CURRENT MEDICATIONS:  1. Include the Tikosyn.  2. Diltiazem.  3. Demadex 20.  4. Wellbutrin 150 b.i.d.  5. Cardizem 240.  6. Digoxin 0.125.  7. Coumadin.  8. Lipitor.  9. Spironolactone.  10.Nexium.   PHYSICAL EXAMINATION:  VITAL SIGNS:  On examination today, her blood  pressure was 116/60.  Her pulse was 116.  LUNGS:  Clear.  CARDIAC:  Her heart sounds were rapid and irregular.  EXTREMITIES:  Were without edema.   Interrogation of her [employer] pulse generator demonstrated multiple  episodes of atrial fibrillation comprising on average about 10-20% of  any one week and over the last month comprising about 30% of the time.  Unfortunately, these episodes of atrial fibrillation were also  associated with a rapid ventricular response. Given the vagaries of the  Identify pulse generator, we do not have heart rate data when she is in  atrial fibrillation, however.  Interrogation of her pulse generator  demonstrates a fibrillation wave of 2 with impedance of 511.  The R wave  was 12.5 with impedance of 780 and a threshold of 0.75 at 0.4.   IMPRESSION:  1. Paroxysmal atrial fibrillation with a unfortunately rapid      ventricular response.  2. Propensity towards bradycardia status post pacemaker.  3. Diabetes.   [name] is having atrial fibrillation with a very poorly controlled  ventricular rate.  Augmented rate control is essential, and so we have  given a prescription for three different beta-blockers which I presume  included metoprolol succinate, nadolol, and atenolol, but it may have  also included Inderal.  The idea is  for her to try them for a couple of  weeks each, and then for [name] to see which of these is tolerated and then  to move forward with assessing its efficacy.   In the event that we cannot obtain adequate control of her rate with  these medications, two issues will need to be addressed. The first is,  do we continue Tikosyn with its pararrhythmic potential and its relative  lack of efficacy; and, if so, do we pursue a strategy of rate control on  the other side or strategy of rhythm control.  I will continue to review  her chart to sort out whether or not she has ever been on amiodarone as  unfortunately the patient does not have good recall of her medication  history.  There was a concern in the past about using amiodarone because  of underlying lung disease.  The alternative would be pulmonary  isolation.   We will see her again in 6 weeks' time for review of the above.     Duke Salvia, MD, [facility]  Electronically Signed    SCK/MedQ  DD: 03/23/2007  DT: 03/23/2007  Job #: [id number]

## 2010-07-22 NOTE — Discharge Summary (Signed)
NAMEDANYIA, BORUNDA              ACCOUNT NO.:  [id number]   MEDICAL RECORD NO.:  [id number]          PATIENT TYPE:  INP   LOCATION:  2036                         FACILITY:  [facility]   PHYSICIAN:  Duke Salvia, MD, FACCDATE OF BIRTH:  1932-02-17   DATE OF ADMISSION:  04/29/2008  DATE OF DISCHARGE:  05/09/2008                               DISCHARGE SUMMARY   ADDENDUM   This addendum covers a medication which we are going to discontinue,  Ativan 1 mg p.o. t.i.d., had some help when the patient was struggling  for breath, now it just simply sedates her, so we are stopping Ativan 1  mg p.o. t.i.d. on her.  A discharge dictated March 3, I had included  that as one of her medications, we are now going to ask her to stop  that.  This has been crossed off her pink sheet prior to her discharge.  She and the family understand.      Maple Mirza, [place]      Duke Salvia, MD, [facility]  Electronically Signed    GM/MEDQ  D:  05/09/2008  T:  05/10/2008  Job:  [id number]

## 2010-07-22 NOTE — Consult Note (Signed)
[name], [name]              ACCOUNT NO.:  [id number]   MEDICAL RECORD NO.:  [id number]          PATIENT TYPE:  INP   LOCATION:                               FACILITY:  [facility]   PHYSICIAN:  Clinton D. Maple Hudson, MD, FCCP, FACPDATE OF BIRTH:  01/07/34   DATE OF CONSULTATION:  05/03/2008  DATE OF DISCHARGE:                                 CONSULTATION   PROBLEM FOR CONSULTATION:  A 77 year old woman with marked dyspnea,  anticipating cardioversion.   HISTORY:  Ms. Maiorana continues to smoke up to a pack a day, stopping  only for this admission.  She is followed by Dr. Shan Levans for  COPD.  She has a history of chronic atrial fibrillation status post  ablation and status post cardioversion attempt in February.  There is a  history of congestive heart failure secondary to diastolic dysfunction  and nonobstructive coronary artery disease.  She describes being  comfortable and at her baseline after the recent cardioversion until she  exercised on a peddling machine.  After she finished that, she was  dyspneic and has had a gradually progressive smothering dyspnea since  then.  She has also had a pressure soreness through the anterior left  chest towards the back.  Since being in the hospital for this admission,  she noted that she coughed up some clear mucus once after a nebulizer  treatment, but otherwise, she has had no fever or sweat, bleeding,  purulent sputum, leg pain, nausea, or vomiting.  She has noted weight  gain and progressive leg edema, right greater than left.   PAST MEDICAL HISTORY:  1. Atrial fibrillation with previous ablation, cardioversion, and      amiodarone, now being treated with Tikosyn.  2. Permanent pacemaker for bradycardia.  3. Diastolic dysfunction with congestive heart failure.  4. COPD with continued tobacco use.  5. Peripheral arterial disease.  6. Obstructive sleep apnea, treated with CPAP, 10 CWP, which is being      used for this admission.  7. Esophageal reflux.  8. Hyperlipidemia.  9. Diabetes.  10.Remote cerebrovascular accident.   SOCIAL HISTORY:  Married.  Tobacco, occasional social alcohol.   FAMILY HISTORY:  Noncontributory.   REVIEW OF SYSTEMS:  As per HPI, otherwise negative.   MEDICATIONS:  Current pulmonary medication includes:  1. Advair 250/50 one puff b.i.d.  2. Spiriva once daily.  3. Guaifenesin 600 mg b.i.d.  4. Flonase 2 sprays each nostril daily.  5. Nebulizer with Xopenex and ipratropium q.4 h. p.r.n.  6. She is on oxygen at 2 liters per minute for this admission but does      not use home oxygen.  CPAP is at 10 CWP for sleep.   OBJECTIVE:  VITAL SIGNS:  BP 126/91, pulse irregularly irregular at 91,  respirations 18, temperature 97.7, oxygen saturation was 94% on 2  liters.  GENERAL:  She is a pleasant overweight elderly woman, sitting on the  edge of her bed, fully oriented and conversational.  Her husband was in  the room.  SKIN:  Warm and dry.  HEENT:  Speech clear.  Oral mucosa normally hydrated.  She was able to  breathe comfortably through her nose.  NECK:  Vein distended, 1 cm.  No stridor.  CHEST:  Rales bilaterally to mid back, shallow inspiratory air flow,  somewhat prolonged expiratory flow without wheeze, rhonchi, or dullness.  HEART:  Irregularly irregular without murmur or gallop.  ABDOMEN:  Softly obese.  EXTREMITIES:  Pitting edema to the knees bilaterally.  Negative Homans.  No cyanosis or clubbing.   CHEST X-RAY:  Images reviewed.  The study from April 29, 2008, showed  left-sided pacemaker, mild interstitial edema with small pleural  effusions.  Her white blood count was recorded at 8500 with a hemoglobin  of 11.5.  B natriuretic peptide 133.   IMPRESSION:  1. Dyspnea reflects progressive congestive heart failure on underlying      chronic obstructive pulmonary disease.  2. The primary correctable factor is her atrial fibrillation and      hopefully successful  cardioversion will restore control of her      congestive failure and improve her dyspnea.  3. I doubt pulmonary embolism but consider Doppling leg veins.  Her      cardioversion has priority as a procedure for tomorrow.  4. She has been choosing between CPAP and oxygen here over the last      couple of nights.  I have written to bleed oxygen into her CPAP      machine so that she can use both, hopefully with some increased      comfort for sleep.  I do not believe we would significantly improve      her respiratory function by adding systemic steroids at this time,      and I do not find evidence of chronic obstructive pulmonary disease      exacerbations separate from her heart failure.      Clinton D. Maple Hudson, MD, [facility], FACP  Electronically Signed     CDY/MEDQ  D:  05/03/2008  T:  05/04/2008  Job:  [id number]   cc:   Madolyn Frieze. Jens Som, MD, [facility]

## 2010-07-22 NOTE — Op Note (Signed)
NAMELORENZO, [name]              ACCOUNT NO.:  [id number]   MEDICAL RECORD NO.:  [id number]          PATIENT TYPE:  OIB   LOCATION:  2899                         FACILITY:  [facility]   PHYSICIAN:  Duke Salvia, MD, FACCDATE OF BIRTH:  10-18-1931   DATE OF PROCEDURE:  09/21/2007  DATE OF DISCHARGE:  09/21/2007                               OPERATIVE REPORT   PREOPERATIVE DIAGNOSIS:  Atrial fibrillation.   POSTOPERATIVE DIAGNOSIS:  Atrial fibrillation.   PROCEDURE:  Direct-current cardioversion.   The patient was submitted to DC cardioversion under the Anesthesiology  in the care of Dr. Jacklynn Bue.  She received 200 mg of sodium Pentothal.   120 joules shock was delivered synchronously and atrial fibrillation and  sinus rhythm was restored.  This was verified by intracardiac  electrograms.  The patient tolerated the procedure without apparent  complication.      Duke Salvia, MD, [facility]  Electronically Signed     SCK/MEDQ  D:  09/21/2007  T:  09/22/2007  Job:  [id number]

## 2010-07-22 NOTE — Assessment & Plan Note (Signed)
[facility]                             PULMONARY OFFICE NOTE   NAME:[name], [name]                       MRN:          [id number]  DATE:08/12/2006                            DOB:          1931/07/21    [name] is a 77 year old white female, history of chronic obstructive  lung disease, asthmatic bronchitis, still smoking a pack and a half a  day of cigarettes.  She failed the Chantix.  Has more trouble when the  heat and humidity has been going up lately.   Her medication profile is reviewed, and is unchanged since the last  visit.  She maintains:  1. Spiriva daily.  2. Advair 250/50 one spray b.i.d.   EXAMINATION:  Temperature was 97.8.  Blood pressure 100/58.  Pulse 77.  Saturation 94% on room air.  CHEST:  Showed diminished breath sounds with no evidence of wheeze,  rale, or rhonchi.  CARDIAC:  Exam showed a regular rate and rhythm without S3.  Normal S1  and S2.  ABDOMEN:  Soft and non-tender.  EXTREMITIES:  Showed no edema, clubbing, or venous disease.  SKIN:  Was clear.  NEUROLOGIC:  Exam was intact.  HEENT:  Exam showed mild nasal inflammation.   IMPRESSION:  Is that of chronic obstructive lung disease, asthmatic  bronchitic component, stable at this time.  Active smoking, failed  Chantix.   PLAN:  Renew Nasonex.  Maintain inhaled medicines, Spiriva and Advair as  is.  We will see the patient back in return followup in 3 months.     Charlcie Cradle Delford Field, MD, [facility]  Electronically Signed    PEW/MedQ  DD: 08/12/2006  DT: 08/12/2006  Job #: [id number]

## 2010-07-22 NOTE — Discharge Summary (Signed)
[name], [name]              ACCOUNT NO.:  [id number]   MEDICAL RECORD NO.:  [id number]          PATIENT TYPE:  INP   LOCATION:  6736                         FACILITY:  [facility]   PHYSICIAN:  Charlcie Cradle. Delford Field, MD, FCCPDATE OF BIRTH:  02-Aug-1931   DATE OF ADMISSION:  02/18/2008  DATE OF DISCHARGE:  02/20/2008                               DISCHARGE SUMMARY   DISCHARGE DIAGNOSES:  1. Acute viral pneumonitis with resultant chronic obstructive      pulmonary disease exacerbation.  2. Hyperglycemia.  3. History of atrial fibrillation, now sinus rhythm.   CONSULTANT:  Peter C. Eden Emms, MD, [facility].   BRIEF HISTORY:  A 77 year old white female patient reports she was in  her usual state of health until February 12, 2008, when while on  vacation, developed productive cough and fever as high as 103.  She was  in [place] at that time, was evaluated there and apparently was felt  to have a pneumonia.  She was treated empirically with Levaquin and told  to follow up with her primary care [name].  She returned to see Dr.  Delford Field for a routine followup, at which time, a chest x-ray was obtained  showing diffuse pulmonary infiltrates.  For this, she was admitted for  further evaluation.   HOSPITAL COURSE BY DISCHARGE DIAGNOSES:  1. Community-acquired pneumonia versus acute viral pneumonitis:  The      patient was admitted to the regular medical ward.  She was afebrile      at the time without significant cough and reported shortness of      breath had improved, in fact never really had significant shortness      of breath to begin with.  She was treated empirically with Rocephin      and azithromycin.  On hospital day #2, she was transitioned to oral      Avelox and now will be discharged home to complete 6 more days of      therapy.  She additionally will complete a rapid prednisone taper      for the component of her COPD exacerbation and has been instructed      to  continue her Spiriva and Advair.  She has a followup with Dr.      Shan Levans in the outpatient setting for further evaluation.  2. Hyperglycemia, exacerbated by systemic steroids:  Plan for this is      to continue and escalated the dose of glipizide at this point and      then transition back to prior dosing.  3. History of atrial fibrillation status post ablation, now in normal      sinus rhythm; however, because of pulmonary infiltrates on chest x-      ray, her amiodarone was discontinued, and she was placed on      Cardizem.  She will be discharged to home on Cardizem only, as well      as Coumadin in the event she would to return to atrial fibrillation      in the outpatient setting.   DISCHARGE INSTRUCTIONS:  Carb modified  diet, heart-healthy.  Follow up  with Dr. Shan Levans on February 29, 2008, at 9:45 and Rubye Oaks  on March 14, 2008, at 9:00 a.m., also she will be seen in the Coumadin  Clinic on February 21, 2008, at 35.   DISCHARGE MEDICATIONS:  1. Cardizem CD 240 p.o. daily.  2. Prednisone 10 mg tab 2 tablets daily x4 days, then 1 tablet daily      x4 days and stop.  3. Glipizide 10 mg tab daily x20 days, then back to 7.5 mg daily.  4. Lipitor 20 mg tab daily.  5. Cilostazol 1 tablet p.o. twice a day, to be taken after meal.  6. Mucinex 2 tablets twice a day.  7. Nasonex 2 sprays each nostril before bed.  8. Spiriva 1 cap inhaled daily.  9. Advair 250/50 one puff twice a day.  10.Wellbutrin SR 150 p.o. twice a day.  11.Torsemide 20 mg tab daily.   Vitamin C, omega-3, glucosamine, chondroitin, calcium as she was, also  Avelox 400 mg p.o. daily x6 days and then discontinue, as well as she  was instructed to continue Coumadin as previously, instructed and guided  by Coumadin Clinic and initially, she will take potassium chloride  supplementation.  She was instructed to stop taking digoxin and  amiodarone.  She will be discharged to home having met maximum  benefit  from inpatient stay.      Tiffany Resides, NP      Charlcie Cradle. Delford Field, MD, [facility]  Electronically Signed    PB/MEDQ  D:  02/20/2008  T:  02/20/2008  Job:  [id number]   cc:   Duke Salvia, MD, [facility]  Deirdre Peer. Polite, M.D.

## 2010-07-22 NOTE — H&P (Signed)
Tiffany, [name] NO.:  [id number]   MEDICAL RECORD NO.:  [id number]          PATIENT TYPE:  OBV   LOCATION:  2004                         FACILITY:  [facility]   PHYSICIAN:  Lorain Childes, MD DATE OF BIRTH:  [birth year]   DATE OF ADMISSION:  07/02/2007  DATE OF DISCHARGE:                              HISTORY & PHYSICAL   PRIMARY CARDIOLOGIST:  Duke Salvia, MD, [facility]   CHIEF COMPLAINT:  Lower extremity edema.   HISTORY OF PRESENT ILLNESS:  The patient is a 77 year old female with  history of tachybrady syndrome status post a pacemaker, recent increase  of atrial fibrillation burden since January 2009 with AFib with RVR  despite multiple drugs, who presents to the ER for increasing lower  extremity edema.  The patient reports she has had ankle swelling for the  past 4 or 5 days, which has been progressing.  Started on her left leg  and then has extended to both legs and going further up from her ankles  up to her calves.  She has been taking her Demadex for her swelling.  Typically, she takes this once a day; however, she has increased it to  twice a day due to the swelling.  She denies any chest pain or shortness  of breath.  No increasing palpitations, no syncope.  She does state that  she has had abdominal swelling for about the past month.  Her lower  extremity edema has been intermittent.  She denies any nausea or  vomiting.  Her weight has been stable.  She states that she has been  watching her diet to decrease salt intake and she is supposed to be  fluid restricting herself.  She has been recently evaluated by Dr. Graciela Husbands  and seen him in clinic on June 08, 2007  to discuss her increased AFib  burden.  At that time, her heart rate was in the 130s.  They discussed  different treatment options including trying amiodarone versus a  pulmonary vein isolation versus AV node ablation with possible coronary  sinus lead placement.  They have not decided on a  treatment plan yet.  She was referred to see Dr. Sampson Goon at [facility] regarding  potential pulmonary vein isolation.  She states that she saw him 2 days  ago, and he wants to add digoxin to her regimen and also increase her  diltiazem.  He then would like to see her back in clinic to discuss  various options and then discuss this further with Dr. Graciela Husbands.  Recently,  she came to the ER for a worsening edema.  Initially on arrival to the  ER, we saw her blood pressure was at 100.  On repeat checks in the  emergency room, her blood pressure was 85/57.  Her heart rate has been  in the 100s-120s on arrival in the ER.  Of note, her blood pressures  chronically run relatively low with the blood pressure at her last  clinic appointment of 90/58 on June 08, 2007.   PAST MEDICAL HISTORY:  1. Atrial fibrillation with RVR.  She has  had a history of tachybrady      syndrome status post [employer] pacemaker placed in November 2005.      She had an AFib burden of 3% in January 2008.  On interrogation of      her pacemaker in January 2009, her AFib burden has increased to      30%.  She has been persistently in AFib with RVR since then.  She      has had very poor rate control.  She has been intolerant to many      beta blockers due to fatigue or sleep disturbances.  She has failed      Tikosyn recently and this was discontinued by Dr. Graciela Husbands in January      2009.  She has also had a history of failing flecainide and prior      DC cardioversions.  2. Preserved LV function with a recent echo on June 15, 2007,      demonstrating an EF of 50%-55%.  She has moderate MR and moderate      PR.  Her estimated RV pressure is 24 mmHg.  Status post cardiac      catheterization in October 2004, which revealed an EF of 50%, her      circumflex had 30%, LAD had 40%, RCA had 30% stenoses.  3. Lower extremity edema which has been intermittent since she has had      difficulty with rate control of her AFib.  4.  COPD.  5. History of CVA with numbness of her face back in 1999.  She has had      no recent neurologic symptoms.  6. History of obstructive sleep apnea.  7. Diabetes.   MEDICATIONS:  1. Bystolic 5 mg p.o. daily.  2. Fosamax 70 mg p.o. weekly.  3. Nexium 40 mg p.o. daily.  4. Cartia 240 mg p.o. q.a.m. and 120 mg p.o. p.m.  5. Glipizide 7.5 mg daily.  6. Warfarin 7.5 mg daily, except Mondays and Fridays where she takes 5      mg and also on Tuesday, she takes 10 mg.  7. Simvastatin 80 mg daily.  8. Digoxin, recently started.  9. Advair 250/50.  10.Demadex 20 mg p.o. b.i.d.  11.Spiriva 18 mcg.  12.Bupropion ER 150 mg p.o. b.i.d.  13.Cilostazol 100 mg p.o. b.i.d.   ALLERGIES:  No known drug allergies.   SOCIAL HISTORY:  She lives with her husband.  She quit tobacco in 2005.  No alcohol use.  She is the daughter-in-law of Dr. Mayford Knife.   FAMILY HISTORY:  Her mother died at the age of 75 from COPD and alcohol.  Father died at the age of 69 from a heart attack.  She has 2 sisters.   REVIEW OF SYSTEMS:  She denies any fevers or chills.  No headache or  visual changes.  No skin rashes or lesions.  She denies any chest pain.  No shortness of breath, dyspnea on exertion, no orthopnea, no PND.  She  does report lower extremity edema as described above.  No palpitations.  No syncopal events.  She denies any hematuria or dysuria.  No focal  weakness or new numbness.  No nausea, vomiting, or diarrhea.  No bright  red blood per rectum.  No melena.  No hematemesis.  All other systems  are negative.   PHYSICAL EXAMINATION:  VITAL SIGNS:  Temperature 97.5, pulse 104,  respirations 16, blood pressure 83/47, repeat is 92/56.  GENERAL:  She  is a pleasant female in no acute distress.  HEENT:  Normocephalic and atraumatic.  NECK:  JVP is approximately 8 cm.  She has no carotid bruits present.  CARDIOVASCULAR:  Tachycardic.  Irregularly irregular.  Normal S1 and S2.  She has a murmur at the apex,  this is 2/6.  LUNGS:  She has a slight crackles at the right base, otherwise good air  exchange.  SKIN:  Warm and well perfused.  Her pacemaker is located in the left  infraclavicular region.  There is no thinning, tenderness, or erosion.  ABDOMEN:  Soft and nontender.  No organomegaly.  EXTREMITIES:  She has bilateral pitting edema.  NEUROLOGIC:  Nonfocal.   Chest x-ray shows cardiomegaly.  No acute ischemic disease.  EKG shows  rate of 109, atrial fibrillation; QRS of 72 milliseconds; QTC of 390  milliseconds, she has late transition.   LABORATORY DATA:  White count of 10.2, hematocrit 38.9, and platelet  229.  Her potassium is 3.4, creatinine is 1.38, and glucose is 163.   ASSESSMENT AND PLAN:  The patient is a 77 year old female with history  of tachybrady syndrome status post pacemaker.  She has had increased  atrial fibrillation burden with rapid ventricular response persistent  since January 2009.  Her rate control has been very difficult to manage  with inadequate control with 5 beta blockers and calcium channel  blockers.  She is now admitted with increasing lower extremity edema.  1. Atrial fibrillation with rapid ventricular response.  Will continue      her Bystolic and diltiazem and start her on digoxin 0.125 daily.      We will continue Coumadin and check an INR.  Her echo earlier this      month showed preserved left ventricular function with an ejection      fraction of 50%-55%.  She has been discussing various treatment      options with Dr. Graciela Husbands and we will continue this discussion while      she is in-house.  2. Lower extremity edema.  We will diurese her gently with intravenous      Lasix.  She has some mild right-sided heart failure by exam, maybe      related to her atrial fibrillation with rapid ventricular response.      Her recent echo showed preserved left ventricular function and her      estimated PA pressures were 24 mmHg.  3. Hypotension.  Her blood  pressure is stable.  She tends to run low      with her medications.  She is completely asymptomatic.  We will      continue to monitor her.  4. Coronary artery disease, we will cycle her cardiac enzymes.  She      has nonobstructive coronary artery disease by her last cath in      2004.  5. Diabetes.  We will check her blood sugar and cover her sliding      scale as needed.  Will continue her glipizide.      Lorain Childes, MD  Electronically Signed     CGF/MEDQ  D:  07/03/2007  T:  07/03/2007  Job:  [id number]

## 2010-07-22 NOTE — Progress Notes (Signed)
[facility]                        PERIPHERAL VASCULAR OFFICE NOTE   NAME:[name], [name]                       MRN:          [id number]  DATE:12/08/2006                            DOB:          02-07-1932    [name] was seen in followup at the [facility]  office on December 08, 2006.  She is a delightful, 77 year old woman with  peripheral arterial disease, who has been cared for by Dr. Alanda Amass and  Dr. Samule Ohm in the past.  She underwent angioplasty of the  infrageniculate vessels of the left leg back in 2001 and also had  stenting of the right SFA in that same year.  Her right external iliac  artery was dissected and she had overlapping stents in the external  iliac during the second procedure.  She has been treated medically since  2001 and has responded well to medical therapy.   Currently, she complains of a great deal of right-leg pain.  Her pain  oftentimes occurs at rest and when lying supine at night.  The pain is  located in the popliteal fossa and radiates up the thigh and down to the  foot.  She does note that she had a Baker cyst removed about ten years  ago.  She has occasional right thigh pain with walking, but this is not  a major problem.  She does not have any typical claudication symptoms  and she denies any calf discomfort with walking.  She has had no foot  pain.  She has had no ulcers or skin break-down.  She has been taking  Pletal only on an intermittent basis, but has never taken it regularly.  She started taking it the past few days because her legs were bothering  her more.   MEDICATIONS INCLUDE:  1. Cilostazol 100 mg twice daily (taken only intermittently).  2. Demodex 20 mg daily.  3. Potassium 40 mEq daily.  4. Wellbutrin-SR 75 mg twice daily.  5. Vitamin C.  6. Calcium.  7. Fosamax 70 mg weekly.  8. Folic acid.  9. Cardizem 240 mg daily.  10.Advair 250/50 mg twice daily.  11.Spiriva  daily.  12.Tikosyn twice daily.  13.Coumadin as directed.  14.Digoxin 0.125 mg daily.  15.Omega 3 fish oil twice daily.  16.Nexium daily.  17.Aldactone 25 mg daily.  18.Lipitor 80 mg daily.  19.Glipizide 7.5 mg daily.   ALLERGIES:  NKDA.   EXAM:  [name] is alert and oriented.  She is in no acute distress.  Weight is 180 pounds, blood pressure is 110/64 in the right arm, 110/60  in the left arm, heart rate 68, respiratory rate 16.  NECK:  Normal carotid upstrokes without bruits.  Jugular venous pressure  is normal.  LUNGS:  Clear to auscultation bilaterally.  HEART:  Regular rate and rhythm without murmurs or gallops.  ABDOMEN:  Soft, nontender, no abdominal bruits.  Femoral pulses are 2+ with soft bilateral bruits.  Popliteal pulses are  1+.  Posterior tibial pulses are 1+.  Dorsalis pedis pulses are 1+  bilaterally.  There is no edema.  There are  varicosities in both legs,  right greater than left, extending up into the popliteal fossa on the  right leg.  There are no skin ulcerations or areas of skin break-down.   ASSESSMENT:  [name] appears stable from the standpoint of her  peripheral arterial disease.  Her symptoms are highly atypical for  vascular insufficiency.  I would like to check an ABI study, since it is  common for patients with PAD to have atypical symptoms.  However, I  suspect her pain is related to a knee problem.  This seems to be the  focus of the majority of her pain at this point.  She is going to see  her orthopedist regarding this matter.  In addition, I asked her to give  Cilostazol a good try and to take it regularly for a minimum of two  months.  I explained that it often takes a two to three-month trial in  order to determine efficacy of this medicine.  She will continue with  aggressive risk reduction in the setting of her PAD.   I would like to see [name] back in six months for followup and, if  she remains stable, I will plan on seeing  her on a yearly basis.     Tiffany Fells. Excell Seltzer, MD  Electronically Signed    MDC/MedQ  DD: 12/08/2006  DT: 12/08/2006  Job #: [id number]   cc:   Armanda Magic, M.D.

## 2010-07-22 NOTE — Discharge Summary (Signed)
[name], [name]              ACCOUNT NO.:  [id number]   MEDICAL RECORD NO.:  [id number]           PATIENT TYPE:   LOCATION:                                 FACILITY:   PHYSICIAN:  Maple Mirza, PA   DATE OF BIRTH:  1931-11-28   DATE OF ADMISSION:  DATE OF DISCHARGE:                               DISCHARGE SUMMARY   ALLERGIES:  The patient has no known drug allergies.   Time for this dictation and exam greater than 45 minutes.   FINAL DIAGNOSES:  1. Admitted with acute on chronic diastolic congestive heart failure.      a.     Symptom, lower extremity edema.  2. Congestive heart failure exacerbated by atrial fibrillation/rapid      ventricular rate.  3. Aggressive diuresis this admission with removal of 4 liters of      fluid.  4. Starting amiodarone therapy this admission.  5. Warfarin changed to brand name Coumadin this admission with      adjustment for amiodarone dosing.  6. Rapid rates have moderated with extensive medical therapy, heart      rate between 90 and 100 in the last 24-hours.   SECONDARY DIAGNOSES:  1. History of atrial fibrillation with rapid rates.      a.     Failed flecainide and more recently Tikosyn.  Tikosyn       stopped May 06, 2007.      b.     Bystolic 5 mg daily, started in May 06, 2007.      c.     The patient intolerant of atenolol and Inderal which cause       sleep disturbances.  2. [employer]. [employer] dual-chamber pacemaker for tachybrady syndrome      implanted in January 23, 2004.  3. Chronic obstructive pulmonary disease.  4. History of CVA.  5. Obstructive sleep apnea.  6. Diabetes.  7. Relative hypotension.    PLAN:  Going forward, the patient will be discharged on amiodarone,  Bystolic, digoxin, and Cardizem, as well as, Coumadin.  The plan is for  cardioversion after 3-weeks of therapeutic pro times.  1. A 2D echocardiogram, June 15, 2007 with ejection fraction 50%.   PROCEDURES:  This admission, pulmonary  function studies, July 04, 2007,  diffusion defect and reduced lung volumes suggested early parenchymal  process, minimal airway obstruction present in view of the severity of  the diffusion defect, studies with exercise would be helpful to evaluate  presence of hypoxemia, reduced diffusing capacity indicates moderately  severe loss of functional alveolar capillary surface.   BRIEF HISTORY:  [name] is a 77 year old female.  She has a history  of tachy-brady syndrome and status post pacemaker implantation.  The  pacemaker was interrogated recently and showed increased atrial  fibrillation burden since January 2009.  The patient is in atrial  fibrillation, rapid ventricular rate despite multiple rate-controlling  drugs.  She presents with increasing lower extremity edema, ankle  swelling over the last 4-5 days.  The patient is not complaining of  shortness of breath or chest pain.  The patient is also not complaining  of nausea, vomiting, or syncope.  She claims that her weight has been  stable.   HOSPITAL COURSE:  The patient presents to the emergency room on April 26  with a complaint of grossly swollen lower extremities, but no chest  pain, no palpitations, no shortness of breath and diagnosed with acute  on chronic diastolic congestive heart failure that were exacerbated by  atrial fibrillation, rapid ventricular rate.  She was immediately  started on aggressive IV diuresis.  Her rate controlling medications  which consists of Cardizem, Bystolic, and digoxin were continued.  On  these medications, her telemetry showed that the heart rates were above  100 beats per minute, usually in the 100-120 range, even at rest  throughout the first 24 hours of her stay.  She was started on  amiodarone 400 mg q.i.d. on admission and within 24 hours her heart  rates had moderated to 90-100 beats per minute.  A diuresis has removed  more than 4 liters of fluid.  The patient says that her lower   extremities have improved quite a bit, although they are not back to  their normal dry weight condition.  The patient discharging on April 28  with all of her home rate control medications plus amiodarone.  The plan  going forward is to present with 3 weeks of pro times in the therapeutic  range and then submit for direct current cardioversion.  This was  planned here but  her pro times were subtherapeutic.  Other options  would include AV node ablation +/- CRT-P or pulmonary vein isolation for  ablation of atrial fibrillation.  The patient had pulmonary function  studies as mentioned above prior to discharge.  She will have a complete  metabolic panel in 6 weeks as well as a thyroid-stimulating hormone  assay.  She will have close followup with [facility] on a weekly basis, and she will see Dr. Graciela [name] as needed.  The  patient discharged on the following medications.  1. Fosamax 70 mg weekly.  2. Nexium 40 mg daily.  3. Glipizide 7.5 mg daily.  4. Advair 250/50, 1 puff twice daily.  5. Wellbutrin 150 mg twice daily.  6. Spiriva, Aerolizer 118 mcg 1 inhalation daily.  7. Pletal 100 mg twice daily.  8. Lipitor 20 mg daily at bedtime.  9. Nasonex 2 puffs each nostril daily as needed.  10.Bystolic 5 mg daily.  11.Digoxin 0.125 mg daily.  12.Cardizem 240 mg in the morning, 120 mg in the evening.  13.Amiodarone 200 mg tablets 2 tablets in the morning 2 tablets in the      evening.  This is a new medication.  14.Coumadin, not warfarin, but Coumadin 4 mg tablets 1/2 tablet daily      or as directed by the Coumadin Clinic.  15.Demadex 20 mg 2 tablets daily.  16.Aldactone 25 mg daily.  She is to restart this.  17.Potassium chloride 20 mEq 2 tablets daily.   The patient has to followup.  1. Coumadin Clinic at [facility] office, Thursday,      April 30 at 2:45 p.m.  2. She will see Dr. Graciela [name], Thursday, May 7, first Coumadin Clinic,      however,  01:30 with a basic metabolic panel and then see Dr. Graciela [name]      at 2 o'clock.  At that office visit, the Coumadin Clinic will      schedule her  for a pro time during the week of May 11, probably      around May 14.  3. She has an office visit with the Coumadin Clinic on Thursday, May      21 at 11 o'clock and then she will see Dr. Graciela [name] at 11:30.  We will      also schedule her to have complete metabolic panel and a TSH      followup in 6 weeks and that would be in June.  We will arrange      that for the patient before her discharge today.   LABORATORY STUDIES:  At the time of discharge, pro time is 26.4, INR  2.3.  Complete blood count this admission, hemoglobin 13.1, hematocrit  38.9, white cells 10.2, platelets 229.  On the day of discharge, sodium  is  140, potassium is 4, chloride 107, carbonate 26, BUN is 18, creatinine  1.11, glucose 126, alkaline phosphatase this admission 64, SGOT 23, SGPT  is 19, troponin I studies have been 0.01 x3, magnesium is 2.6 this  admission.  TSH this admission 2.026. Free T4 is 1.11, within normal  limits.  Digoxin level 0.5.  The BNP on admission was 96.      Maple Mirza, PA     GM/MEDQ  D:  07/05/2007  T:  07/06/2007  Job:  [id number]   cc:   Oretha Milch, MD  Deirdre Peer Polite, M.D.  Duke Salvia, MD, [facility]  Armanda Magic, M.D.

## 2010-07-22 NOTE — Assessment & Plan Note (Signed)
[facility]                            CARDIOLOGY OFFICE NOTE   NAME:Winkowski, ALAA MULLALLY                     MRN:          [id number]  DATE:05/16/2008                            DOB:          21-Nov-1931    PRIMARY CARDIOLOGIST:  Duke Salvia, MD, [facility]   Ms. Otterson is a pleasant patient of Dr. Odessa Fleming who recently underwent  DC cardioversion for atrial fib flutter successfully on April 23, 2008.  She then was admitted on February 22, with acute on chronic  diastolic heart failure in the setting of rapid atrial fibrillation.  She was started on Tikosyn and underwent direct cardioversion on  May 04, 2008, with conversion to normal sinus rhythm.   Since the patient has been home, she denies any chest pain,  palpitations, dyspnea, dyspnea on exertion, dizziness, or presyncope.  She is scheduled to undergo cataract surgery by Dr. Dione Booze on March 22,  and wants to make sure everything is stable for her to undergo this.  She also plans on going to [place] in early April which Dr. Graciela Husbands has  told her is fine.  She is wondering whether she has to pay for oxygen on  the plane as she only uses it at night.  I asked her to check with Dr.  Delford Field concerning this and she has an appointment to see him next week.   CURRENT MEDICATIONS:  1. Prednisone taper.  2. Diltiazem 120 mg daily.  3. Furosemide 60 mg one-half b.i.d.  4. Pletal 50 mg b.i.d.  5. Lexapro 20 mg daily.  6. Tikosyn 250 mcg b.i.d.  7. Spironolactone 12.5 mg daily.  8. Aspirin 81 mg daily.  9. Lipitor 20 mg daily.  10.Digoxin 0.125 mg daily.  11.Coumadin as directed.  12.Spiriva daily.   REVIEW OF SYSTEMS:  All systems negative except for that in the HPI.   PHYSICAL EXAMINATION:  GENERAL:  This is a pleasant 77 year old white  female in no acute distress.  VITAL SIGNS:  Blood pressure 120/68, pulse 80, weight 183.  NECK:  Without JVD, HJR bruit, or thyroid enlargement.  LUNGS:   Clear decreased breath sounds, but clear anterior, posterior,  and lateral.  HEART:  Regular rate and rhythm at 80 beats per minute.  Normal S1 and  S2.  No murmur, rub, bruit, thrill, or heave noted.  ABDOMEN:  Soft without organomegaly, masses, lesions, or abnormal  tenderness.  EXTREMITIES:  Without cyanosis, clubbing, or edema.  She has good distal  pulses.  NEUROLOGICAL:  Without focal deficit.   EKG paced rhythm, underlying normal sinus rhythm.   IMPRESSION:  1. Atrial fibrillation status post direct cardioversion on February      15, with recurrent rapid atrial fibrillation, placed on Tikosyn and      repeat cardioversion on May 04, 2008, now maintaining sinus      rhythm.  2. Acute on chronic diastolic heart failure exacerbated by rapid      atrial fibrillation, resolved.  3. Chronic obstructive pulmonary disease with ongoing tobacco abuse.      I have given her nicotine patch  prescription to quit smoking.  4. Chronic obstructive pulmonary disease on home oxygen to follow up      with Dr. Delford Field concerning oxygen on the airplane.  5. Dual-chamber pacemaker for tachy-brady syndrome in November 2005.  6. Gastroesophageal reflux disease.  7. Ongoing tobacco habituation.  8. Dyslipidemia.  9. Remote cerebrovascular accident.  10.Obstructive sleep apnea on CPAP.  11.Diabetes mellitus.  12.Ejection fraction 50-55% on echo in April 2009.  13.Class III chronic kidney disease.   PLAN:  I spoke with Dr. Graciela Husbands about this patient and he says she can  proceed with a cataract surgery.  The patient is on Coumadin and Dr.  Dione Booze is aware according to the patient.  She will consult Dr. Delford Field  concerning the oxygen issues.  Currently, she is stable from a cardiac  standpoint maintaining normal sinus rhythm and is euvolemic.  I will  make no medication changes.  As stated above, I gave her a prescription  for nicotine patches to help assist in her smoking cessation.  O2 levels  in  the office today were 93% on room air.      Jacolyn Reedy, PA-C  Electronically Signed      [name], MD, [facility]  Electronically Signed   ML/MedQ  DD: 05/16/2008  DT: 05/17/2008  Job #: [id number]   cc:   Molly Maduro L. Dione Booze, M.D.

## 2010-07-22 NOTE — Procedures (Signed)
[name], [name]              ACCOUNT NO.:  [id number]   MEDICAL RECORD NO.:  [id number]          PATIENT TYPE:  OUT   LOCATION:  SLEEP CENTER                 FACILITY:  [facility]   PHYSICIAN:  Barbaraann Share, MD,FCCPDATE OF BIRTH:  11-17-33   DATE OF STUDY:  03/08/2007                            NOCTURNAL POLYSOMNOGRAM   REFERRING PHYSICIAN:  Charlcie Cradle. Delford Field, MD, FCCP   INDICATION FOR STUDY:  Hypersomnia with sleep apnea.   EPWORTH SLEEPINESS SCORE:  18.   SLEEP ARCHITECTURE:  The patient had a total sleep time of 249 minutes  with no slow wave sleep and very small quantities of REM.  Sleep onset  latency was normal at 19 minutes and REM onset was normal at 91 minutes.  Sleep efficiency was very poor at 68%.   RESPIRATORY DATA:  The patient was found to have no obstructive apneas  and 6 obstructive hypopneas for an apnea-hypopnea index of 1.4 events  per hour.  She had an additional 3 episodes of respiratory effort  related arousal for a respiratory disturbance index of 2.2 events per  hour.  There was no significant snoring noted during the night.   OXYGEN DATA:  The patient had O2 desaturation as low as 85% with most of  these occurring independent of her obstructive events.  All in total,  she spent 42 minutes less than 88% during the night.  Oxygen was added  by protocol at 12:18 a.m. at 1 liter with her O2 saturation staying in  the 88% to 91% thereafter.   CARDIAC DATA:  Frequent PVCs were noted throughout.   MOVEMENT-PARASOMNIA:  The patient had small numbers of periodic leg  movements, none of which resulted in arousal or awakening.   IMPRESSION/RECOMMENDATION:  1. Small numbers of obstructive events which do not meet the apnea-      hypopnea index criteria for the obstructive sleep apnea syndrome.  2. O2 desaturation as low as 85%, with the patient spending 42 minutes      during the entire night less than 88%.  Most of the desaturations      occurred  spontaneously.  Oxygen was added at 12:18 a.m. at 1 liter      per minute and      resulted in her O2 saturation staying between 88 and 91 for the      rest of the night.  3. Frequent PVCs were noted throughout without significant ventricular      arrhythmia.      Barbaraann Share, MD,FCCP  Diplomate, American Board of Sleep  Medicine  Electronically Signed     [facility]/MEDQ  D:  Jun 18, 202009 14:58:44  T:  Jun 18, 202009 16:37:05  Job:  [id number]

## 2010-07-22 NOTE — Letter (Signed)
May 06, 2007    Armanda Magic, M.D.  [address]  [place], [place] [address]   RE:  [name], [name]  MRN:  [id number]  /  DOB:  07-23-1931   Dear Gloris Manchester:   This letter is just to keep you abreast.  Your mother-in-law came in  today with continued problems with asymptomatic rapid atrial  fibrillation.  She has atrial undersensing on her pacemaker and,  notwithstanding, still has at least 30-40% of her time in atrial  fibrillation and her rates are quite rapid today, over 120.  These are  without palpitations.  She does describe a dinner that she did recently  at the [facility] where she had significant edema and fatigue.   We had tried at our last visit to initiate beta blocker therapy.  She  failed with the atenolol and the Inderal, both cause of giving a sleep  disturbance.  Nadolol was not quite so bad.   Her other medications include cilostazol, furosemide for the edema,  Cartia 240, Nexium, Fosamax, glipizide, and a variety of nutraceuticals.  She is also on Lipitor, warfarin, Aldactone 25.   EXAMINATION:  Her blood pressure was 102/61 with a pulse of 113.  Her lungs were clear.  Her heart sounds were rapid and irregular.  The extremities had trace edema on the right and 1 to 2+ edema on the  left.   Interrogation of his St. Jude pulse generator demonstrated the  aforementioned results, that is, at least 35% of her time was in atrial  fibrillation.  I think that this is largely associated with  undersensing.  We unfortunately do not have histograms available to [name]  in [employer].   IMPRESSION:  1. Persistent atrial fibrillation with a rapid ventricular response.  2. Tikosyn therapy for #1.  3. Previously normal left ventricular function.  4. Exercise intolerance associated with #1.  5. Diabetes.  6. Aldactone therapy for the support of #1.  7. Chronic obstructive pulmonary disease.  8. Attempted trial with beta-blockers as noted above.   Traci, I have elected to ahead and stop the Tikosyn as is ineffective.  We will plan to stop her spironolactone as well, as she probably does  not need the buffeting of her potassium.  I do want to repeat an echo,  which we will do when she returns from her [place] trip, but  initially will plan to start her on Bystolic.  Hopefully its more beta  selectivity will prevent some of the sleep disturbances and avoid any  lung complications.  Maybe we can get some utility out of this.  In the  event that we cannot control the rate, I think that we should pursue  amiodarone and then subsequent consideration for pulmonary vein  isolation.  An echo will help dictate urgency, and will give [name] some  insight as to her left ventricular function.   Thank you for the privilege being involved in your mother-in-law's care.    Sincerely,      Duke Salvia, MD, [facility]  Electronically Signed    SCK/MedQ  DD: 05/06/2007  DT: 05/06/2007  Job #: [id number]

## 2010-07-22 NOTE — Letter (Signed)
November 04, 2007    Dr. Clydie Braun  [facility] [place], [place] [address]   RE:  Tiffany, [name]  MRN:  [id number]  /  DOB:  06/22/1931   Dear Onalee Hua:   [name] came in today at [facility]'s request because of  dyspnea on exertion and some degree of fluid overload.  They had taken a  trip to [place].  They were eating out a lot and she was quite dyspneic  with her exertion.  Since her return, her energy levels are closer to  baseline.  She does have some peripheral edema.  She is up 4 pounds or  so from her dry weight.  The other issue has been her daytime  somnolence.  I am trying to get further records on her most recent sleep  evaluation.  She does wear CPAP, as you know.  There is also a  significant component of sleep deprivation, as she was taking her  Aldactone prior to bed and getting up a number of times during the night  to urinate.  I have intercurrently stopped her spironolactone as it had  been used to support potassium levels in the context of Tikosyn.   Her other medications include,  1. Bupropion 150 b.i.d.  2. Furosemide 20 taken b.i.d. increased now to 40/20 alternating with      20/20.  3. Potassium.  4. Nexium 40.  5. Glipizide 7.5.  6. Mucinex.  7. Spiriva.  8. Lipitor 80.  9. Cartia 240.  10.Coumadin.  11.Amiodarone 400 mg today.  12.Her Lanoxin dose of 0.125.  I should note that her preop labs      apparently are scheduled as per preoperative schedule.   Her blood pressure on examination today was 108/59.  Her pulse was 74,  weight was 184 with a dry weight that we expected about 180.  Her neck veins were 8-9 cm.  Her lungs were clear.  Her heart sounds were regular.  The extremities had 1-2 plus edema.  She was in no acute distress.   Electrocardiogram demonstrated atrial pacing at 70 with intrinsic  conduction.   IMPRESSION:  1. Atrial fibrillation -  paroxysmal holding sinus rhythm on      amiodarone.  2. Congestive heart failure - chronic - diastolic manifested by      peripheral edema and dyspnea on exertion.  3. Chronic obstructive pulmonary disease.  4. Daytimesomnolence.      a.     Obstructive sleep apnea.      b.     Sleep deprivation.  5. Diabetes.  6. Prior stroke with some recent problems with right foot drag.   Onalee Hua, we  will be working on getting [name]'s fluid status  optimized in anticipation of your procedure.  We are very grateful for  your help.   I should note also that she is concerned that she may be having more of  a tremor in her hands, which she attributes to the initiation of the  amiodarone, so there may be some role for postprocedural Tikosyn as an  alternative.   I will be glad to see her afterwards if I can help.  Otherwise, I have  suggested that she keep most of her initial followup under your care and  like I said, whatever I can do to facilitate that I am glad to do.    Sincerely,      Duke Salvia,  MD, [facility]  Electronically Signed    SCK/MedQ  DD: 11/04/2007  DT: 11/05/2007  Job #: [id number]

## 2010-07-22 NOTE — H&P (Signed)
[name], [name]              ACCOUNT NO.:  [id number]   MEDICAL RECORD NO.:  [id number]          PATIENT TYPE:  OIB   LOCATION:  2899                         FACILITY:  [facility]   PHYSICIAN:  Duke Salvia, MD, FACCDATE OF BIRTH:  1931/04/14   DATE OF ADMISSION:  10/10/2008  DATE OF DISCHARGE:                              HISTORY & PHYSICAL   PRIMARY CAREGIVER:  Deirdre Peer. Polite, MD   ELECTROPHYSIOLOGIST:  Duke Salvia, MD, [facility]   TIME FOR ATTENDANCE ON THIS PATIENT:  Greater than 1.5 hours.   ALLERGIES:  The patient has no known drug allergies.   PRESENTING CIRCUMSTANCE:  They sent me over to the office for a  procedure.   HISTORY OF PRESENT ILLNESS:  [name] is a 77 year old female who at  office visit with Dr. Nehemiah Settle last week was found to have recurrent  atrial fibrillation.  Electrocardiogram was past along to Dr. Odessa Fleming  office.   The patient mentions that she has had a possible lapse in taking Tikosyn  lately.  Her protimes have also been subtherapeutic.  Her potassium has  been below 4.0.   The patient was hospitalized on April 2009 and February 2010 with acute  on chronic diastolic congestive heart failure which was exacerbated by  atrial fibrillation and rapid ventricular rate.  At the February 2010  admission, she was restarted on Tikosyn at 250 mcg every 12 hours.  She  also had recurrent cardioversion to sinus rhythm in that admission.   The patient reported to Dr. Odessa Fleming office on Tuesday, October 09, 2008.  Her electrocardiogram showed atrial fibrillation with a rate of 130  beats per minute.  The patient does not amazingly report any particular  symptoms attributable to this dysrhythmia, no palpitation, no fatigue,  no chest pain I am always short of breath I have COPD.   The patient was set up for transesophageal echocardiogram and direct  current cardioversion today; however, she presents in sinus rhythm.   MEDICATIONS ON ADMISSION:  1.  Klor-Con 20 mEq 1 tablet in the morning, one half tablet in the      evening.  2. Lexapro 20 mg q.a.m.  3. Advair Diskus 250/50 one puff twice daily.  4. Spiriva inhaler 18 mcg per inhalation one inhalation daily.  5. Pletal 100 mg daily.  6. Omeprazole 20 mg tablets 2 tablets daily.  7. Lipitor 20 mg daily at bedtime.  8. Glucosamine and chondroitin complex twice daily.  9. Cardizem 120 mg daily.  10.Iron capsules 20 mg CR daily.  11.Xopenex HFA metered dose inhaler 2 puffs every 6 hours as needed.  12.Furosemide 40 mg twice daily.  13.Spironolactone 25 mg one half tablet daily.  14.Tikosyn 250 mcg twice daily.  15.Enteric-coated aspirin 81 mg daily.  16.Oxygen 2 liters overnight as needed.  17.Glipizide 5 mg daily.  18.Warfarin sodium 5 mg 1 tablet everyday except for 1-1/2 tablets on      Sunday, 1-1/2 tablets on Wednesday.   SOCIAL HISTORY:  The patient lives in [place] with her husband.  She  is currently retired.  She has ongoing  tobacco habituation, smokes one-  pack per day.  Occasional alcohol.   FAMILY HISTORY:  Noncontributable due to the patient's age and also I  admit that I did not take family history this admission.   REVIEW OF SYSTEMS:  The patient not complaining of fever, chills, night  sweats.  She does indicate that she has had a gain of 10-15 pounds over  the last 6 months.  No lymphadenopathy.  HEENT:  No epistaxis, no  hoarseness, no vertigo.  No photophobia.  No diplopia, no hearing loss.  INTEGUMENT:  No rashes, no nonhealing ulcerations.  CARDIOPULMONARY:  The patient has experienced with edema when she has acute on chronic  congestive heart failure not complaining of chest pain.  She does get  short of breath with exertion.  She does sleep in a reclining bed both  head and legs up.  She does not give any indication that she has had a  history of presyncope or syncope.  She does not have palpitation.  She  does have a harsh productive cough during  this examination.  UROGENITAL:  The patient is menopausal, occasional nocturia.  No frequency or  urgency.  No hematuria.  NEUROPSYCHIATRIC:  No weakness, numbness,  depression, anxiety.  GASTROINTESTINAL:  Heartburn symptoms are well-  controlled with a proton pump inhibitor.  She is not complaining of  abdominal pain.  She is not experiencing melena, bright red blood per  rectum, nausea, vomiting or diarrhea.  ENDOCRINE:  No heat or cold  intolerance.  She does have a history of diabetes for which she takes  glipizide MUSCULOSKELETAL:  The patient does not complain of any  specific joint pains except she says she has sciatica.  All other  systems have been reviewed and are negative.   PHYSICAL EXAMINATION:  VITAL SIGNS:  Temperature 98.7, pulse is 89 and  regular, blood pressure 128/61, respiration rate is 23, oxygen  saturation 93% room air, weight is 186.  GENERAL:  The patient is alert and oriented x3 and comfortable at this  examination.  HEENT:  Eyes:  Pupils equal, round, and reactive to light.  Extraocular  movements are intact.  Sclerae are anicteric.  Nares without discharge.  NECK:  Supple without carotid bruits.  No thyromegaly.  No jugular  venous distention.  HEART:  Regular rate and rhythm.  Monitor shows sinus rhythm with PACs  and PVCs.  No murmurs auscultated.  LUNGS:  Relatively clear to auscultation.  Some bibasilar rales noted.  ABDOMEN:  Soft, nondistended.  Bowel sounds are present.  The deeper  structures not palpated.  EXTREMITIES:  No evidence of clubbing, cyanosis or edema.  No rashes or  lesions.  MUSCULOSKELETAL:  No joint deformity or effusions.  NEUROLOGIC:  Alert and oriented x3.  Cranial nerves II-XII grossly  intact.  Grip strength 5/5 bilaterally.   Laboratory studies were obtained yesterday at the office at [facility].  Sodium 144, potassium 3.8, this is low for Tikosyn we  wanted greater than 4, chloride 105, carbonate 33, BUN is 16,  creatinine  1.0, glucose 136, magnesium is 235.  The patient takes magnesium  supplementation.   IMPRESSION:  1. The patient presents to primary caregiver and also to the office of      [facility] in recurrent atrial fibrillation but she is in      sinus rhythm today at the proposed transesophageal echocardiogram      direct current cardioversion.  2. Acute on  chronic diastolic congestive heart failure April 2009.      She was started on amiodarone at that time.      a.     She had cardioversion on amiodarone September 21, 2007.  3. The patient underwent pulmonary vein isolation procedure September      2009 at [facility].  4. Amiodarone stopped December 2009.  5. Recurrent atrial fibrillation March 17, 2008.      a.     Direct current cardioversion April 13, 2008.  6. The patient has failed both Tikosyn and flecainide in the past.  7. Tachybrady syndrome pacemaker implanted November 2005.  8. Gastroesophageal reflux disease.  9. Ongoing tobacco habituation.  10.Chronic obstructive pulmonary disease/emphysema.  11.Dyslipidemia.  12.Obstructive sleep apnea/CPAP.  13.Remote CVA.  14.Diabetes.  15.Echocardiogram April 2009, ejection fraction 50-55% diastolic      congestive heart failure.  16.Class III chronic kidney disease.  This is her diagnosis, although      her creatinine is 1.0 today.   She follows up with Dr. Graciela Husbands in 2-3 weeks and that would be on Monday  October 22, 2008 at 11 o'clock.  She has a [facility] appointment  Tuesday, October 16, 2008 at 9:30 at which time her Coumadin or warfarin  as she calls will be adjusted.  Because her potassium is 3.8 today, I  have admonished the patient to increase her potassium intake from 1  tablet in the morning and one half tablet in the evening to a dose of 1  tablet p.o. b.i.d.  She will have a basic metabolic panel tested at Dr.  Odessa Fleming office visit Monday, October 22, 2008 to check to make sure that  her potassium is  above 4.  The patient has been instructed that the  reason she takes potassium is to keep her potassium by 4.  The reason  she takes her magnesium is to keep the magnesium above 2.  I have  consulted with Dr. Jimmey Ralph and written a prescription for her warfarin 5  mg tablets everyday except 1.5 mg on Sunday and Wednesday.      Maple Mirza, [place]      Duke Salvia, MD, [facility]  Electronically Signed    GM/MEDQ  D:  10/10/2008  T:  10/11/2008  Job:  [id number]

## 2010-07-22 NOTE — Progress Notes (Signed)
[facility]                        PERIPHERAL VASCULAR OFFICE NOTE   NAME:TURNERMckenley, [name]                       MRN:          [id number]  DATE:07/13/2007                            DOB:          December 23, 1931    [name] presents for follow-up with the [place] peripheral  vascular office on Jul 13, 2007.  She is a 77 year old woman with lower  extremity peripheral arterial disease.  She has undergone stenting of  the right superficial femoral artery, right external iliac artery and  below knee vessels in the left leg, all procedures done several years  ago.  She has intermittent claudication and has responded relatively  well to medical therapy.  Her main complaints are that of right knee  pain and left foot pain.  She does not think either of these problems  are vascular in nature as her knee pain is related to arthritis, and she  relates her foot pain to plantar fasciitis.  She has no typical calf  claudication symptoms.  She has been on Pletal now for a good length of  time.   ABIs were performed last October and were nearly normal at 0.94 on the  right and 0.97 on the left.  Her wave forms were brisk and biphasic and  were not suggestive of significant lower extremity PAD.   CURRENT MEDICATIONS:  Include:  1. Spiriva daily.  2. Lipitor 80 mg daily.  3. Vitamin C 500 mg at bedtime.  4. Spironolactone 25 mg daily.  5. Cartia XT 240 mg daily in the morning and 120 at night.  6. Coumadin as directed.  7. Amiodarone 200 mg daily.  8. Digoxin 0.125 mg daily.  9. Pletal 100 mg b.i.d.  10.Bupropion ER 150 mg twice daily.  11.Torsemide 20 mg daily.  12.Potassium chloride 20 mEq daily.  13.Fosamax 70 mg weekly.  14.Nexium 40 mg daily.  15.Glipizide 7.5 mg daily.  16.Glucosamine chondroitin.  17.Omega 3 fish oil.  18.Nasonex.  19.Colace.  20.Mucinex.  21.Advair.   ALLERGIES:  NKDA.   On exam, she is alert and oriented.  No acute distress.   Weight is 185,  blood pressures 86/64, heart rate 76, respiratory rate 16.  HEENT:  Is normal.  NECK:  Normal carotid upstrokes without bruits.  Jugular venous pressure  is normal.  LUNGS:  Are clear.  HEART:  Irregular without murmurs or gallops.  ABDOMEN:  Soft, nontender, no abdominal bruits.  EXTREMITIES:  Femoral pulses are 2+, popliteal pulses are 1+.  PT and DP  pulses are 1+ bilaterally.  SKIN:  Is warm and dry with no area of skin breakdown or ulceration.   ASSESSMENT:  [name] is stable with regard to her lower extremity  peripheral arterial disease.  Her ABI in October was reassuring at  greater than 0.9 bilaterally.  She should continue aggressive secondary  risk reduction measures.  Continue Pletal for claudication.  I would  like to see her back in 1 year in follow-up.     [name]. Excell Seltzer, MD  Electronically Signed    MDC/MedQ  DD: 08/15/2007  DT: 08/15/2007  Job #: [id number]

## 2010-07-22 NOTE — H&P (Signed)
[name], [name]              ACCOUNT NO.:  [id number]   MEDICAL RECORD NO.:  [id number]          PATIENT TYPE:  INP   LOCATION:  2036                         FACILITY:  [facility]   PHYSICIAN:  Adela Ports, MD   DATE OF BIRTH:  [birth year]   DATE OF ADMISSION:  04/30/2008  DATE OF DISCHARGE:                              HISTORY & PHYSICAL   CHIEF COMPLAINT:  Shortness of breath, edema and chest pain.   HISTORY OF PRESENT ILLNESS:  [name] is a 77 year old woman with past  medical history significant for atrial fibrillation status post  radiofrequency ablation, with recent cardioversion on April 23, 2008;  congestive heart failure secondary to diastolic dysfunction,  nonobstructive coronary artery disease and COPD.  [name] underwent  DC cardioversion for recurrent atrial fibrillation after ablation on  April 23, 2008.  Since that time she has had intermittent weakness,  shortness of breath, lower extremity edema, weight gain and chest  pressure.  These symptoms started the day after her cardioversion, then  improved slightly and came back over the past 3 days.  Her chest  pressure got worse today, so she presented for further evaluation.  She  describes the chest pain as left-sided pressure, predominately anterior,  though there is occasional radiation towards the back.  She denies  diaphoresis, nausea or vomiting.  She cannot identify any aggravating or  relieving factors.  She has also noticed increasing lower extremity  edema and shortness of breath.  She denies orthopnea, though she sleeps  on a hospital bed and with her head elevated at all times.  She noted  that her weight went up about 8 pounds at one point during the week, and  so she increased her home torsemide dose -- with improvement in her  weight but no improvement in her symptoms.  She denies fevers or chills,  but she has had a chronic cough that may be slightly increased.  She  does continue to  smoke.   PAST MEDICAL HISTORY:  1. Atrial fibrillation, status post recent radio frequency ablation in      September 2009.  Treated with amiodarone, which was stopped in      December 2009.  2. History of permanent pacemaker placement for bradycardia.  3. Diastolic dysfunction with congestive heart failure.  4. Atrial flutter present after atrial fibrillation.  5. History of DC cardioversion, most recently April 23, 2008.  At      that time flecainide was initiated.  6. COPD.  7. Continued smoking.  8. Peripheral arterial disease, status post PTCA.  9. Obstructive sleep apnea, treated with CPAP.  10.Nonobstructive coronary artery disease, with catheterization in      2004; demonstrating 40% mid LAD, 30% proximal circumflex and 30%      right coronary lesions.  11.Gastroesophageal reflux disease.  12.Dyslipidemia.  13.Hypersomnia  14.Diabetes.  15.Remote CVA.  16.Most recent echocardiogram in April 2009 demonstrated low-normal      ejection fraction of 50-55%; with moderate MR, moderately dilated      left atrium at 46 mm diameter, and moderate tricuspid  regurgitation.   SOCIAL HISTORY:  The patient is married.  She continues to smoke and  drinks occasional alcohol.  She denies any other drug use.   FAMILY HISTORY:  Noncontributory.   REVIEW OF SYSTEMS:  As above, otherwise 9-system review of systems was  negative in detail.   ALLERGIES:  No known drug allergies.   MEDICATIONS:  1. Cardizem 240 mg daily.  2. Torsemide 20 mg daily.  3. Glipizide 10 mg daily.  4. Nexium.  5. Lipitor 20 mg daily.  6. Magnesium.  7. Iron  8. Advair 250/50 b.i.d.  9. Spiriva daily.  10.Nasonex two sprays q.h.s.  11.Flecainide 100 mg b.i.d., started April 24, 2008.  12.Cilostazol one tablet b.i.d.  13.Mucinex two tablets b.i.d.  14.Wellbutrin SR 150 mg b.i.d.  15.Vitamin C.  16.Omega-3 fatty acid.   PHYSICAL EXAMINATION:  Temperature 99.1, pulse 103, respiratory rate 22,   blood pressure 104/61, oxygen saturation 99% on room air.  GENERAL:  She is a pleasant woman in no acute distress.  HEENT: Extraocular movements intact.  Mucous membranes moist.  NECK:  Supple without lymphadenopathy, thyromegaly.  JVD is elevated to  the jaw line at about 30 degrees recline.  CARDIOVASCULAR EXAM:  Irregularly irregular and tachycardic, with a 2/6  systolic murmur at the lower left sternal border and apex.  LUNGS:  Crackles halfway up on the right and one-quarter of the way up  on the left, with anterior expiratory wheezing; but no wheezes  appreciated posteriorly.  SKIN: No rashes or lesions.  ABDOMEN: Soft, nontender, nondistended; with no rebound or guarding.  EXTREMITIES: No clubbing or cyanosis.  There is 2+ edema to the upper  extremities.  MUSCULOSKELETAL:  Grossly normal.  NEUROLOGIC: Grossly normal.   RADIOLOGY:  Chest x-ray shows mild cardiomegaly with small bilateral  effusions, mild interstitial edema suggesting mild volume overload.  EKG:  Atrial fibrillation with left bundle branch block.   LABS:  White count 11.2, hematocrit 39.1, platelets 190.  Potassium 3.5,  creatinine 1.08, INR 2.2.  Digoxin less than assay.   ASSESSMENT/PLAN:  [name] is a 76 year old woman with past medical  history of atrial fibrillation, status post ablation without resolution;  and more recently, DC cardioversion on April 23, 2008 with initiation  of flecainide at that time.  She also has nonobstructive coronary  disease, diastolic dysfunction, diabetes, peripheral arterial disease,  sleep apnea.  She presents now with signs and symptoms of congestive  heart failure in the setting of atrial fibrillation with rapid  ventricular response.  At this point we will diurese her with IV  diuretics and congestive heart failure monitoring and care.  We will  cycle her cardiac enzymes and monitor her EKGs to rule out acute  coronary syndrome.  We will treat her with full-dose  aspirin and  continue her warfarin.  We will check TSH, lipid panel and A1c.  Given  her wide QRS, we will hold her flecainide for now and attempt rate  control with Diltiazem bolus and drip.  We will discuss with EP  regarding changing to another anti-arrhythmic.      Adela Ports, MD  Electronically Signed     DWM/MEDQ  D:  04/30/2008  T:  04/30/2008  Job:  [id number]

## 2010-07-22 NOTE — Op Note (Signed)
NAMECHRISHANA, SPARGUR              ACCOUNT NO.:  [id number]   MEDICAL RECORD NO.:  [id number]          PATIENT TYPE:  OIB   LOCATION:  2899                         FACILITY:  [facility]   PHYSICIAN:  Duke Salvia, MD, FACCDATE OF BIRTH:  [birth year]   DATE OF PROCEDURE:  04/23/2008  DATE OF DISCHARGE:  04/23/2008                               OPERATIVE REPORT   PREOPERATIVE DIAGNOSIS:  Atrial fibrillation/flutter.   POSTOPERATIVE DIAGNOSIS:  Sinus rhythm.   PROCEDURE:  Direct current cardioversion.   The patient was submitted to general anesthesia under the care Dr.  Michelle Piper.  She received 175 mg of Pentothal.  A single 120-joule shock  delivered synchronously with atrial fibrillation terminating into atrial  flutter and restored sinus rhythm.   Given low potassium and relative patient's inconvenience, we decided to  initiate flecainide therapy which will be done at 100 mg twice daily.  I  will see her again in 2 weeks' time.  We will also begin her magnesium  250 mg a day to help maintain potassium and may help decrease atrial  fibrillation as well.   We will await follow up results of her sleep study and I have contacted  Dr. Earl Gala about this.      Duke Salvia, MD, [facility]  Electronically Signed     SCK/MEDQ  D:  04/23/2008  T:  04/23/2008  Job:  [id number]   cc:   Deirdre Peer. Polite, M.D.  Mick Sell, MD

## 2010-07-22 NOTE — Consult Note (Signed)
NAMESUDIKSHA, [name]              ACCOUNT NO.:  [id number]   MEDICAL RECORD NO.:  [id number]          PATIENT TYPE:  INP   LOCATION:  6736                         FACILITY:  [facility]   PHYSICIAN:  [name], MD, FACCDATE OF BIRTH:  28-Dec-1931   DATE OF CONSULTATION:  02/18/2008  DATE OF DISCHARGE:                                 CONSULTATION   [name] is a 77 year old patient who we were asked to see by the  pulmonary doctors for followup of paroxysmal atrial fibrillation.  The  patient was admitted to the hospital with a COPD exacerbation and  possible pneumonia.  She has been sick for a few weeks.  She was in  [place] and placed on Levaquin.  She apparently has had fevers last  week.  She continues to smoke and has moderate COPD.   Her cardiac history is primarily remarkable for atrial fibrillation.  In  September, she had an AFib ablation by Dr. Sampson Goon over at [facility].  She has felt much better since then in regards to her heart.  She has  maintained sinus rhythm.  She was on low-dose amiodarone which my  understanding was to be stopped later this month anyway.  We were asked  by the pulmonary doctors to make sure this was okay.   The patient has not had any significant recurrent palpitations.   She has no documented coronary disease.   She does have documented vascular disease with previous stents to the  lower extremities; however, she does not have claudication syndrome.  She primarily has pain in the right lower extremity from knee problem.   Currently, she does not feel particularly short of breath.  She is  afebrile.  She does have a cough.  She is not producing any sputum.   PAST MEDICAL HISTORY:  Remarkable for PAF on chronic Coumadin followed  in our clinic with AFib ablation by Dr. Sampson Goon in September, tachy-  brady syndrome with a St. Jude pacemaker placed January 23, 2004,  question diastolic heart failure, last echo on June 15, 2007, showing  an  EF of 50-55% with moderate MR, she has obstructive sleep apnea, COPD  with continued smoking, peripheral vascular disease with previous  external iliac stent I believe on the right, and diabetes.   She has no known allergies.   She was on,  1. Amiodarone 200 a day.  2. Pletal 100 mg b.i.d. for claudication.  3. Demadex 20 a day.  4. Lipitor 20 a day.  5. KCl 40 a day.  6. Nexium 40 a day.  7. Glipizide 7.5 a day.  8. Spiriva.  9. Fosamax.  10.Advair.  11.Coumadin as directed by our clinic.  12.Mucinex.  13.I believe she has been started on prednisone antibiotics here in      the hospital for her COPD exacerbation.   The patient lives with her husband of 50 years.  She has traveled quite  a bit living in [place], [place], and [place].  She apparently is a  mother-in-law of Lichelle Viets, one of the [facility] cardiologists.  She  continues to smoke and  has alcohol on occasion.   FAMILY HISTORY:  Noncontributory.   PHYSICAL EXAMINATION:  GENERAL:  Remarkable for an elderly white female  in no distress.  She is in sinus rhythm at a rate of 70.  VITAL SIGNS:  Her temp is 97.9, blood pressure 110/65, room air sats are  95%.  HEENT:  Unremarkable.  NECK:  Carotids are normal without bruit.  No lymphadenopathy,  thyromegaly, or JVP elevation.  LUNGS:  Crackles at the right base, which do not clear with deep  inspiration or coughing.  CARDIAC:  There is an S1, S2 with a systolic murmur.  Pacer is on the  left clavicle in good position.  PMI normal.  ABDOMEN:  Benign.  Bowel sounds positive.  No AAA.  No tenderness.  No  abdominal bruits.  She has faint bilateral femoral bruits with +2 PTs  bilaterally.  No evidence of dependent rubor.  Trace edema.  NEUROLOGIC:  Nonfocal.  SKIN:  Warm and dry.  MUSCULOSKELETAL:  No muscular weakness.   There is no EKG in the chart.  There is no recent chest x-ray.  Chest x-  ray from 10th showed pacemaker in good position with some cardiac   enlargement, patchy bilateral infiltrative densities, no lobar pneumonia  or overt heart failure.  Her lab results currently remarkable for  potassium of 4.2, creatinine 1.3, protime 2.7, white count of 9.7, crit  40.5.   IMPRESSION:  1. Paroxysmal atrial fibrillation status post ablation maintaining      sinus rhythm.  It is okay to stop her amiodarone.  This may require      an increase in her chronic dose of Coumadin anticoagulation.      Currently, she is therapeutic.  This may be offset by a course of      antibiotics that she is getting in the hospital.  After discharge,      she will need to have her INR followed closely by our clinic and we      will tentatively plan on checking at the end of next week.  2. Peripheral vascular disease, stable.  Lower extremity leg pain      primarily from right knee osteoarthritis at this point.  Continue      Pletal.  3. Hyperlipidemia in the setting of vascular disease in the lower      extremities.  Continue Lipitor.  Lipid and liver profile in 6      months.  4. Question of pneumonia with chronic obstructive pulmonary disease      exacerbation.  The only caveat here would be that she is status      post atrial fibrillation ablation.  She does not act like a patient      who would have pulmonary vein stenosis.  There is no focal area in      her lungs that appear to be hypoperfused by chest x-ray.  This      needs to be followed.  For the time being, Pulmonary will treat her      with inhalers, prednisone, and antibiotics.  5. History of diastolic heart failure, this may be contributing.  We      will check a BNP.  She will continue her current dose of Demadex.  6. History of mild chronic renal insufficiency.  This does not seem to      be important at this time.  Her creatinine is in the good range.      She is  on low-dose diuretics.   We would be happy to follow the patient here in the hospital and again,  there is no contraindication  stopping her amiodarone at this time if  there is any fears that are contributing to her lung problems.  The  patient's biggest problem is her continued smoking which she desperately  needs to stop given her known chronic obstructive pulmonary disease and  peripheral vascular disease.      [name], MD, [facility]  Electronically Signed     PCN/MEDQ  D:  02/18/2008  T:  02/18/2008  Job:  [id number]

## 2010-07-22 NOTE — Assessment & Plan Note (Signed)
[facility]                         ELECTROPHYSIOLOGY OFFICE NOTE   NAME:Tiffany, [name]                       MRN:          [id number]  DATE:01/16/2008                            DOB:          1931-07-31    HISTORY OF PRESENT ILLNESS:  [name] is seen in followup for her  atrial fibrillation and diastolic heart failure.  She is now status post  atrial fibrillation ablation undertaken by Dr. Sampson [name] about 6 weeks  ago, and she is currently in sinus rhythm.  She has some peripheral  edema, although this is much improved.  Her shortness of breath is  unchanged.  She continues to smoke.   She has seen Dr. Darrick [name] currently.  His evaluation is not available.  It was his intention to see her in about six months' time.  Last  creatinine we have was 1.4 in May 2009 with a GFR of 39   She also complains of significant fatigue, which she says is worse since  her ablation procedure.   Her constipation is better.  Since her procedure, her [name] was  discontinued, amiodarone has been maintained, furosemide has been added,  cilostazol is new, Lipitor is decreased, and she was on Coumadin.   PHYSICAL EXAMINATION:  VITAL SIGNS:  On examination, her blood pressure  was 114/58; her pulse was 71; and her weight was 179, which is stable.  NECK:  Her neck veins were 7 cm.  LUNGS:  Her lungs were clear.  HEART:  Heart sounds were regular without murmurs.  ABDOMEN:  The abdomen was soft.  EXTREMITIES:  The extremities had 1+ peripheral edema.   DIAGNOSTICS DATA:  Electrocardiogram demonstrated sinus rhythm.   IMPRESSION:  1. Paroxysmal atrial fibrillation, status post atrial fibrillation      ablation.  2. Congestive heart failure - chronic - diastolic.  3. Chronic obstructive pulmonary disease for ongoing smoking.  4. Hypersomnia/sleep apnea.  5. Status post St. Jude dual chamber pacemaker.  6. Fatigue   [name] is stable from arrhythmia point  of view.  She is to see Dr.  Sampson [name] tomorrow.  At that time, decision was need to be made as  related to amiodarone therapy.     Tiffany Salvia, MD, [facility]  Electronically Signed    SCK/MedQ  DD: 01/16/2008  DT: 01/17/2008  Job #: [id number]   cc:   [name], M.D.  Tiffany Braun, MD @ [facility]

## 2010-07-22 NOTE — Letter (Signed)
August 26, 2007    Clydie Braun, M.D.  [facility] [place], [place] [address]   RE:  [name], [name]  MRN:  [id number]  /  DOB:  [birth year]   Dear Onalee Hua:   Durene Fruits comes in today without significant change in her  symptoms.  Interestingly, her heart rate is much better controlled since  the initiation of the amiodarone, and she has heart rates now in the 70s  and 80s.   Pulmonary function testing done in the interim demonstrated significant  diffusion defect but without significant impairment to outflow.  Her  shortness of breath is stable.   Her INR has been subtherapeutic, unfortunately.  It was 1.9 last week.  Repeat is pending today.   MEDICATIONS:  Currently include:  1. Lipitor 80.  2. Aldactone 25.  3. Cartia 240  4. Coumadin.  5. Amiodarone 400 a day.  6. Lanoxin 0.125.  7. Furosemide 40.  8. Glipizide 7.5.  9. Nexium.  10.Mucinex and Advair.   On examination, her blood pressure was a little low still at 97/56 with  a pulse of 72.  Her neck veins were flat.  Her lungs were clear.  Her heart sounds were fairly regular with an early systolic murmur.  Extremities had trace edema.   Interrogation of her intracardiac signals demonstrated atrial  fibrillation persisting with both ventricular pacing and some intrinsic  conduction.   IMPRESSION:  1. Atrial fibrillation - persistent.  2. Amiodarone for #1.  3. INR now at 2.0.  4. Pulmonary disease with a depressed diffusing capacity.   Ms. Sorcha, [place], is looking forward to following up with you to rule  out the possibility of pulmonary vein isolation.  Given her diffusing  capacity, I think the general consensus that amiodarone is a not great  long-term drug is probably right, as you pointed out the data from last  fall pulmonary vein isolation versus AV junction ablation and pacing is  probably preferable.   We will need to check the  Lanoxin level, and I will plan to decrease her  Cartia given her modest hypotension now that we have her rate  controlled.   Thanks very much for your help.    Sincerely,      Duke Salvia, MD, [facility]  Electronically Signed    SCK/MedQ  DD: 08/26/2007  DT: 08/26/2007  Job #: [id number]   CC:   Armanda Magic, M.D.  Charlcie Cradle Delford Field, MD, FCCP

## 2010-07-22 NOTE — Letter (Signed)
Jul 14, 2007    [name], M.D.  [facility] [place], [place] [address]   RE:  [name]  MRN:  [id number]  /  DOB:  Aug 08, 1931   Dear Onalee Hua:   This is an update on [employer].  She comes in today with her atrial  fibrillation.  She comes in still in atrial fibrillation.  Her heart  rate is much improved with resting rates today in the 70s and 80s on her  amiodarone.  She has been in atrial fibrillation persistently.   She saw the pulmonologist; her PFTs are pending.  They were able to get  a sleep mask that she is tolerating and using.   Her blood pressure remains a little bit low in the high 90s range, but  she is feeling quite well.  Her lungs were clear.  Her heart sounds were regular.  The extremities  were without edema.   Her other medications in addition to her amiodarone which was still at  800 mg a day was Cartia 240, spironolactone 25, Lipitor 80 and Lanoxin  0.125.  I had her decreased her amiodarone from 800 to 400 mg a day.  Her Coumadin we will work on being adjusted in the Coumadin  Clinic, and I should note that her INR today was 1.1.  We will plan to  cut her Cardizem in half given her blood pressure and adequately  controlled heart rate.   I will forward the PFTs to you as soon as we have a copy of them.    Sincerely,      Duke Salvia, MD, [facility]  Electronically Signed    SCK/MedQ  DD: 07/14/2007  DT: 07/14/2007  Job #: [id number]

## 2010-07-22 NOTE — Letter (Signed)
November 28, 2007    Deirdre Peer. Polite, MD  [address], [address]  [place], [place] [address]   RE:  [name], [name]  MRN:  [id number]  /  DOB:  20-Dec-1931   Dear Onalee Hua,   [name] comes in today a couple weeks after her ablation procedure.  Thank you very much for your assistance with her.  I have to apologize  to you as I did to her about the lack of prior discussions about her  class III kidney issues with a creatinine that has been running in the  mid 1s for the last year and half or so.   In any case, she is doing some better at this point without peripheral  edema and without significant shortness of breath.  I should note though  that she often tends to minimize her symptoms.   I reviewed her medications and she is on amiodarone now 200 mg a day.  I  have discontinued her Lanoxin.  We have kept her off of her diuretics at  this point.   On examination today, her blood pressure was 116/70, her pulse was 80.  Her lungs were clear.  Her heart sounds were regular.  The extremities  had just trace edema.   Interrogation of her St. Jude pulse generator demonstrated no  intercurrent atrial fibrillation.   We will plan to see her again at the end of November; she is scheduled  to see on January 17, 2008.  Until that time, I have asked her to  continue her amiodarone.  I have discontinued her Lanoxin.  I have also  discontinued her potassium and she is not taking any diuretics.  She is  to take her torsemide as needed.   We will look forward to seeing her after she sees you.   Thanks again for your help.    Sincerely,      Duke Salvia, MD, [facility]  Electronically Signed    SCK/MedQ  DD: 11/28/2007  DT: 11/29/2007  Job #: [id number]

## 2010-07-22 NOTE — Consult Note (Signed)
NAMELENNETTE, FADER              ACCOUNT NO.:  [id number]   MEDICAL RECORD NO.:  [id number]          PATIENT TYPE:  OBV   LOCATION:  2004                         FACILITY:  [facility]   PHYSICIAN:  Oretha Milch, MD      DATE OF BIRTH:  [birth year]   DATE OF CONSULTATION:  DATE OF DISCHARGE:                                 CONSULTATION   [name] is a pleasant 77 year old Caucasian woman with goal stage 2  COPD and obstructive sleep apnea.  She has been maintained on a regimen  of Advair and Spiriva by Dr. Delford Field, who had seen her in January 2009  for evaluation of sleep apnea.  She seems to have been maintained on a  CPAP of +10 cm with nasal mask and chin strap for at least 2 years.  A  CPAP titration study in January 2006 showed optimal titration at 10 cm,  for control of snoring.  A followup somnogram in December 2008 was  performed as a baseline study to achieve 249 minutes of sleep with 15  minutes of REM sleep; however, she did not sleep in the supine position.  Surprisingly, very few events were noted.  BHI was less than 5 events  per hour, and she spent 42 minutes on the saturation less than 88%.  Oxygen was initiated with improvement in her oxygen levels.   In light of her current problem that has been refractory is atrial  fibrillation, which she has been battling for a few weeks now, she has a  known tachybrady syndrome and multiple medications including Bystolic  and Cardizem have not worked.  She has just been started on amiodarone  on July 03, 2007, and procedures such as AV nodal ablation and  pulmonary vein isolation are being considered.  DC cardioversion is  planned after loading, if no spontaneous conversion occurs.   PAST MEDICAL HISTORY:  Includes  1. COPD.  2. Known coronary artery disease.  3. GERD.  4. Hyperlipidemia.   MEDICATIONS:  Include  1. Bystolic 5 mg per day.  2. Fosamax 70 mg p.o. daily.  3. Nexium 40 mg per day.  4. Cardizem 240 mg per  day.  5. Glipizide 10.5 mg daily.  6. Warfarin 5 mg daily.  7. Digoxin 0.125 mg per day.  8. Lipitor.  9. Advair 250/50 1 puff b.i.d.  10.Spiriva HandiHaler daily.  11.Demadex 20 mg b.i.d.   SOCIAL HISTORY:  She has an active lifestyle, works in the garden,  Microbiologist, and Tai Chi everyday.  She does drink half cup  of decaffeinated coffee everyday.  She quit smoking in 2005, smoked  about 2 packs per day at the worse.   FAMILY HISTORY:  Noncontributory.   REVIEW OF SYSTEMS:  She reports 7-pound weight loss since __________  have been started.   PHYSICAL EXAM:  GENERAL:  A woman sitting on the edge of the bed, in no  upper respiratory distress.  VITAL SIGNS:  Heart rate 82-95 per minute, irregular heart, respirations  18 per minute, blood pressure 97/58, and oxygen saturation 93% on room  air.  HEENT:  Narrow pharyngeal space.  NECK:  Supple.  No JVD, no lymphadenopathy.  CV:  S1 and S2 irregular.  CHEST:  Decreased breath sounds at both bases.  No rhonchi.  ABDOMEN:  Soft and nontender.  NEUROLOGICAL:  Nonfocal.  EXTREMITIES:  No edema.   LAB DATA:  Magnesium 2.6, BUN and creatinine 16 and 1.08, potassium 4.0,  calcium 8.8.  INR was 1.8.  Cardiac enzymes have been negative.  WBC  count on admission was 10.3, hemoglobin 13.1, and platelets 229.  Chest  x-ray showed hyperinflation without any infiltrates.   IMPRESSION:  1. Gold stage 2 chronic obstructive pulmonary disease.  2. Obstructive sleep apnea and sleep apnea + 10 cm.  3. Nocturnal desaturation noted on last sleep study in December 2008.  4. Few periodic leg movements, but none are associated with arousals.   RECOMMENDATIONS:  1. Her COPD seems to be optimized at present on a current regimen of      Advair and Spiriva.  Her recent decrease in exercise tolerance is      likely related more to cardiac arrhythmia rather than to worsening      lung function.  2. Full PFTs will be obtained including  DLCO to establish a baseline,      since amiodarone has been started.  In case amiodarone is needed      long term, this can be followed up in the future.  3. She seems to be well compliant with her CPAP.  She has found a      nasal mask that she simply loves.  Her current problem is that the      humidifier seems to make a whooshing noise when the CPAP starts,      and I have asked advanced home care to look into this.  She may      need a new humidifier.  4. CPAP download will be obtained in 2 weeks' time to see if her sleep      apnea is optimally controlled or not or whether there is a mask      leak and/or events.  Apparently, once she is converted to sinus      rhythm, CPAP will have a role in maintaining sinus rhythm, and I      have discussed this with her.  5. We have also obtained nocturnal oximetry on CPAP to see if those      desaturations persist.  If they do, we will consider adding oxygen      to her CPAP at night.      Oretha Milch, MD  Electronically Signed     RVA/MEDQ  D:  07/04/2007  T:  07/05/2007  Job:  [id number]   cc:   Duke Salvia, MD, [facility]  Armanda Magic, M.D.

## 2010-07-22 NOTE — Assessment & Plan Note (Signed)
[facility]                         ELECTROPHYSIOLOGY OFFICE NOTE   NAME:TURNERRoda, [name]                       MRN:          [id number]  DATE:06/08/2007                            DOB:          1931-08-21    [name] [name] comes in today in follow-up for atrial fibrillation with a  rapid ventricular response.  Tikosyn was discontinued at her last visit  because of failure to control her atrial fibrillation.  Bystolic was  initiated in the hopes that we could find a beta-blocker that was  tolerated, all others having been not well tolerated because of problems  with sleep disturbance.  There are some problems with sleep with this,  she has tolerated it okay.  However, it is not adequately effective (see  below).   Her other medications currently include furosemide 20, potassium,  Fosamax, Nexium,  cardia 240, glipizide 7.5, Bystolic 2.5 and warfarin.   PHYSICAL EXAMINATION:  Her blood pressure was 90/58 however and her  pulse was 133.  Her lungs were clear.  Her heart sounds were irregular with an early murmur.  Abdomen was soft.  Extremities had 1+ peripheral edema.  Her weight was 184 pounds.   Interrogation of her pacemaker demonstrated that she is mode switched  the entire time but we not have heart rates from it.   IMPRESSION:  1. Atrial fibrillation with an uncontrolled ventricular response.  2. Relatively near normal left ventricular systolic function.  3. History of bradycardia status post pacemaker implantation.  4. Peripheral vascular disease with prior PCI.  5. Pulmonary disease of unclear magnitude.   [name] [name] has uncontrolled atrial fibrillation with a rapid ventricular  response.  Control of her rate is essential and the urgency with which  this needs to be pursued will hopefully be clarified by an ultrasound  which is scheduled to be done this week.   The options include amiodarone referral for pulmonary vein isolation  and/or  AV junction ablation with and without LV lead placement.  I will  need to talk with Dr. Delford Field and I have put a call in to his office  today. I put a call in to his office today and hopefully will talk with  him tomorrow regarding his thoughts about the use of amiodarone either  in the short-term or in the longer term. It is the only antiarrhythmic  drug for which he is a candidate.   The alternative would be AV junction ablation with or without LV lead  upgrade and/or.  pulmonary vein isolation.   I reviewed these options with [name] [name] and offered to refer her to one  of the ablation centers for consideration of pulmonary vein isolation  versus AV ablation.  She said that she would not be able to help make  the decision even after talking with them and would prefer that that  recommendation be made for her.  I told her that I would discuss her  situation with a couple of different EPs who are involved in atrial  fibrillation ablation asking the question of what is most appropriate in  this 77 year old lady  with persistent atrial fibrillation with  significant concomitant lung disease.  It may well be that  AV ablation  with or without LV upgrade is the most expeditious solution.   I will forward to talking with my colleagues about her and then getting  back with her.     Tiffany Salvia, MD, [facility]  Electronically Signed    SCK/MedQ  DD: 06/08/2007  DT: 06/09/2007  Job #: [id number]   cc:   [name] [name], M.D.

## 2010-07-22 NOTE — H&P (Signed)
NAMEJESSAMY, TOROSYAN              ACCOUNT NO.:  [id number]   MEDICAL RECORD NO.:  [id number]          PATIENT TYPE:  INP   LOCATION:  6736                         FACILITY:  [facility]   PHYSICIAN:  Charlcie Cradle. Delford Field, MD, FCCPDATE OF BIRTH:  01-[birth year]   DATE OF ADMISSION:  02/18/2008  DATE OF DISCHARGE:                              HISTORY & PHYSICAL   CHIEF COMPLAINT:  Cough and dyspnea.   HISTORY OF PRESENT ILLNESS:  This is a very pleasant 77 year old white  female who reports she was in her usual state of health until February 12, 2008, when while on vacation developed a productive cough primarily  clear sputum, this worsened in frequency and severity, as well as fever  as high as 103.  She denies significant increase in shortness of breath,  chest pain, wheezing, denies recent sick exposure, or purulence of  sputum.  Apparently, she continued to have on and off fever and chill  necessitating an actual hospital visit while on vacation on February 12, 2008, at which time, an evaluation was done with the feeling that Ms.  Rawling had a mild community-acquired pneumonia.  She was discharged from  the hospital following a diagnostic CT and with a prescription for  Levaquin and Ativan.  She is followed by Dr. Shan Levans in the  outpatient setting and was seen again in routine followup on February 16, 2008.  A chest x-ray was obtained showing bilateral pulmonary  infiltrates.  She was therefore given her multiple medical  comorbidities, and challenging medical regimen, she was admitted for  further evaluation of pulmonary infiltrates.  Upon time of arrival, Ms.  Castrillon reports no dyspnea, no chest pain, no wheeze.  She reports that  her fever has been on and off, but essentially that is the only  complaint she has.  In fact, she really only complains of fluctuation in  feeling occasional chills.   PAST MEDICAL HISTORY:  Chronic obstructive pulmonary disease,  obstructive sleep  apnea, active smoker, history of coronary artery  disease, gastroesophageal reflux disease, hyperlipidemia, hypersomnia  secondary to sleep apnea, and chronic atrial fibrillation status post AV  nodal ablation at [facility].   FAMILY HISTORY:  Noncontributory.   SOCIAL HISTORY:  Half-cup a day coffee, active smoker, participates in  tai chi and water aerobics.   ALLERGIES:  No known drug allergies.   MEDICATIONS:  List as follows,  1. Demadex 20 mg p.o. daily.  2. Klor-Con 20 mEq daily.  3. Wellbutrin SR half a tab by mouth twice a day.  4. Fosamax 70 mg p.o. weekly.  5. Cardizem CD 240 mg daily.  6. Advair Diskus 250/50 one puff twice a day.  7. Spiriva 18 mcg daily.  8. Bystolic 2.5 mg daily.  9. Nasonex 50 mcg 2 puffs p.o. p.r.n.  10.Pletal 100 mg 1 tab twice a day.  11.Amiodarone 1 tab daily at 200 mg.  12.Nexium 40 mg daily.  13.Glipizide 7.55 mg daily.  14.Mucinex 600 mg 1 tab twice a day.  15.Lipitor 20 mg daily.  16.Spironolactone 25 mg  daily.  17.Colace 100 mg daily.  18.Digoxin 0.125 mg daily.  19.Vitamin C CR 500 mg daily.  20.Calcium 500 with vitamin D 500/40 once daily.  21.Glucosamine 1500 mg daily.   REVIEW OF SYSTEMS:  Per HPI.  Currently, Ms. Speece feels quite well  with the exception of fluctuant episodes of chills.  She is currently  not in acute distress.  All other review of systems is normal except for  those mentioned in the history of present illness.   PHYSICAL EXAMINATION:  VITAL SIGNS:  Temperature 97.7, heart rate 87,  respirations 18, blood pressure 115/68, saturations 95%.  GENERAL:  This is a well-developed, well-nourished 77 year old female  currently in no acute distress.  HEENT:  She is normocephalic without JVD or adenopathy.  PULMONARY:  Notable for dry posterior crackles, no wheeze.  She does  demonstrate a dry nonproductive cough.  CARDIAC:  Regular rate and rhythm without murmur.  EXTREMITIES:   Without edema.  ABDOMEN:  Soft, nontender.  GENITOURINARY:  Deferred.  NEUROLOGICAL:  Alert and oriented.   IMPRESSION AND PLAN:  1. Pulmonary infiltrates of uncertain etiology.  Certainly,      differential diagnosis includes atypical community-acquired      pneumonia, viral pneumonitis, or pneumonitis in the setting of      amiodarone toxicity.  Plan at this point is to rule out infection      and obtain sputum and blood cultures as well as urine strep      Legionella, we will follow up chest x-ray, ESR, have cardiology      evaluate in assistance with medical regimen.  We will discontinue      her amiodarone at this time.  Additionally, we may need to consider      fiberoptic bronchoscopy for further diagnostic evaluation, but at      this time, we would like to see where her pulmonary infiltrates are      in correlation to film on February 16, 2008.  We have asked      Cardiology to evaluate and assist with her current cardiology      regimen and we will admit her for 24-hour observation.      Additionally, we will obtain 12-lead EKG, and BNP.  2. Chronic obstructive pulmonary disease.  Plan for this is to      continue baseline chronic obstructive pulmonary disease  regimen.  1. History of chronic atrial fibrillation status post ablation.  Plan      for this is to have cardiology evaluate and assist with medical      regimen, we will be discontinuing her amiodarone, we will obtain a      12-lead EKG.  At this time,      it appears as though she was on amiodarone in place of digoxin,      Cardizem, and Bystolic.  We will resume these medications as she      was previously on in place of discontinuing the amiodarone, and of      course, we will welcome cardiology recommendations for further      titration of the medicines.      [name], NP      Charlcie Cradle. Delford Field, MD, [facility]  Electronically Signed    PB/MEDQ  D:  02/18/2008  T:  02/18/2008  Job:  [id number]   cc:    Duke Salvia, MD, [facility]

## 2010-07-22 NOTE — Consult Note (Signed)
[name], [name]              ACCOUNT NO.:  [id number]   MEDICAL RECORD NO.:  [id number]          PATIENT TYPE:  INP   LOCATION:  2036                         FACILITY:  [facility]   PHYSICIAN:  Marlan Palau, M.D.  DATE OF BIRTH:  1931/11/16   DATE OF CONSULTATION:  05/07/2008  DATE OF DISCHARGE:                                 CONSULTATION   HISTORY OF PRESENT ILLNESS:  [name] is a 77 year old right-  handed white female born on 11-04-1931, with a history of COPD and  diastolic congestive heart failure, atrial fibrillation, status post  cardioversion on April 23, 2008.  This patient was admitted for  congestive heart failure, increasing shortness of breath, possible  exacerbation of COPD.  This patient has over the last couple days in the  hospital reported transient events of double vision and is primarily  horizontal in nature.  The double vision will tend to occur in the  evening hours and be fairly brief.  The patient claims that the episodes  last no more than a minute or two.  The patient may blink her eyes and  the double vision would go away.  The patient does not report any ptosis  or any other associated symptoms such as headache, numbness, or weakness  on the face, arms, or legs.  The patient has not had any dizziness, loss  of consciousness episodes.  The patient denies any ear pain or hearing  changes, or ringing in the ears.  Neurology was asked to see the patient  for evaluation of double vision.   PAST MEDICAL HISTORY:  Significant for:  1. History of episodic double vision as above.  2. COPD.  3. Atrial fibrillation, status post cardioversion on April 23, 2008.  4. Pacer placement.  5. Congestive heart failure.  6. Peripheral arterial disease, status post PTCA in the past.  7. Obstructive sleep apnea.  8. Gastroesophageal reflux disease.  9. Diabetes.  10.History of stroke involving the left brain in the distant past.  11.Dyslipidemia.  12.Benign essential tremor, seen previously by Dr. Sandria Manly.   ALLERGIES:  The patient has no known allergies.   SOCIAL HISTORY:  Smokes a pack of cigarettes daily.  Drinks alcohol on  occasion.  This patient is married, lives in the [place] area, has  four children who are alive and well.  The patient is retired.   FAMILY MEDICAL HISTORY:  That mother died of unknown cause.  Father died  with heart disease.  The patient has two sisters who are alive and well.  No brothers.   REVIEW OF SYSTEMS:  Notable for no recent fevers or chills.  The patient  does note some chronic shortness of breath.  Denies chest pain.  Denies  nausea or vomiting.  Does have some double vision.  Denies dizziness.  Denies any blackout episodes.   PHYSICAL EXAMINATION:  VITAL SIGNS:  Blood pressure currently is 111/67,  heart rate 78, respiratory rate 22, and temperature afebrile.  GENERAL:  This patient is a minimally obese white female who is sleepy,  but  can be aroused at the time of examination.  HEENT:  Head is atraumatic.  Eyes:  Pupils are round and react to light.  Disks are flat bilaterally.  NECK:  Supple.  No carotid bruits noted.  RESPIRATORY:  Notable for occasional bilateral wheezes.  CARDIOVASCULAR:  A regular rate and rhythm.  Grade 1/6 systolic ejection  murmur at the aortic area is noted.  EXTREMITIES:  Without significant edema.  NEUROLOGIC:  Cranial nerves:  Facial symmetry is present.  The patient  has good sensation of face to pinprick and soft touch bilaterally.  Extraocular movements are relatively full with the exception of some  decrease in superior gaze with both eyes.  Speech is well enunciated,  not aphasic.  Motor testing of the face is normal.  Good head turning  and shoulder shrug strength is noted.  Speech again is well enunciated  and not aphasic.  Motor test reveals good strength in all fours.  Good  symmetric motor tone is noted throughout.  Sensory  testing is intact to  pinprick, soft touch, vibratory sensation throughout.  The patient has  no evidence of extinction.  The patient has good finger-nose-finger and  total finger bilaterally.  Gait was not tested.  Deep tendon reflexes  are symmetric and normal.  Toes are neutral and downgoing bilaterally.  Again, no drift is seen in the arms.   LABORATORY DATA:  Laboratory values are notable for a white count of  10.4, hemoglobin of 11.1, hematocrit of 32.3, MCV of 90.3, and platelets  of 186, INR is 1.8.  Sodium 135, potassium 4.0, chloride of 101, CO2 of  28, glucose of 108, BUN of 14, creatinine 1.02, total bili 0.7, alkaline  phosphatase is 60.  SGOT 23, SGPT 29, total protein 6.7, albumin of 3.4,  calcium of 8.9.  Hemoglobin A1c of 6.3.  CK 58, MB fraction 2.1,  troponin-I 0.01.  Cholesterol level 131 and triglycerides of 83.  HDL of  48, LDL of 66, VLDL of 17, TSH of 1.384.  Digoxin level less than 0.2.  Urinalysis reveals specific gravity of 1.020, pH of 5.5, otherwise  unremarkable.   IMPRESSION:  1. Episodic double vision, etiology unclear.  2. Congestive heart failure.  3. Chronic obstructive pulmonary disease.  4. Benign essential tremor.  5. Atrial fibrillation, status post cardioversion.   This patient has multiple risk factors for stroke events.  The patient's  episodes of double vision are not clearly related to cerebrovascular  disease, however.  We do need to rule out possibility of latent  strabismus with medication effect, rule out metabolic disturbances such  as B12 deficiencies, rule out myasthenia gravis.  We will look at  cerebrovascular circulation as well.   PLAN:  1. CT of the head.  2. CT angiogram with intracranial vessels.  3. Carotid Doppler study.  4. Check blood work to include B12, angiotensin-converting enzyme      level, acetylcholine receptor antibody level (binding).  We will      follow the patient's clinical course while in-house.    CURRENT MEDICATIONS:  At this time include:  1. Aspirin 325 mg daily.  2. Diltiazem 120 mg p.o. b.i.d.  3. Tikosyn 250 mcg q.12 h.  4. Lexapro 20 mg daily.  5. Nexium 40 mg daily.  6. Flonase 2 sprays at night.  7. Advair 1 puff twice daily.  8. Lasix 40 mg daily.  9. Glipizide 10 mg daily.  10.Guaifenesin 1200 mg twice daily.  11.Xopenex 0.63  inhaler q.i.d.  12.Ativan 1 mg three times daily.  13.The patient is on potassium 20 mEq daily.  14.Zocor 40 mg daily.  15.Spironolactone 12.5 mg daily.  16.Spiriva 18 mcg inhaler daily.  17.Coumadin 2.5 mg daily.  18.Xanax 0.25 mg q.8 h. if needed.  19.Percocet if needed.   The patient again does have diabetes, could have double vision  associated with this, but extraocular movements today appeared to be  normal.  The patient is not experiencing double vision at this time and  has intermittent symptoms.  We will follow the patient's clinical course  while in-house.      Marlan Palau, M.D.  Electronically Signed     CKW/MEDQ  D:  05/07/2008  T:  05/08/2008  Job:  [id number]   cc:   [facility]

## 2010-07-22 NOTE — Discharge Summary (Signed)
[name], [name]              ACCOUNT NO.:  [id number]   MEDICAL RECORD NO.:  [id number]          PATIENT TYPE:  INP   LOCATION:  2036                         FACILITY:  [facility]   PHYSICIAN:  Duke Salvia, MD, FACCDATE OF BIRTH:  1931/12/25   DATE OF ADMISSION:  04/30/2008  DATE OF DISCHARGE:  05/04/2008                               DISCHARGE SUMMARY   She has no known drug allergies.   TIME REQUIRED FOR THIS DICTATION, EXAMINATION, AND DISCHARGE:  Greater  than 50 minutes.   FINAL DIAGNOSES:  1. Admitted with dyspnea on exertion/weakness/weight gain/lower      extremity edema/chest pressure.  2. Hospitalized in April 2009 for acute on chronic diastolic      congestive heart failure, which was exacerbated by atrial      fibrillation, rapid ventricular rate.  3. Currently the patient is hospitalized with acute on chronic      diastolic congestive heart failure exacerbated by atrial      fibrillation, rapid ventricular rate, also with contribution from      chronic obstructive pulmonary disease, emphysema and generalized      weakness and dyspnea.  4. Restarted on Tikosyn therapy on this admission, 250 mcg every 12      hours.  5. Direct current cardioversion at 1:00 p.m., February 26, Dr. Nona Dell with conversion to sinus rhythm after dose #3 of Tikosyn.      The patient shows no appreciable QT prolongation on Tikosyn      therapy.   SECONDARY DIAGNOSES:  The patient has had a long history with multiple  interventions for her atrial fibrillation.  1. Hospitalized on April 2009 with acute on chronic diastolic      congestive heart failure exacerbated by atrial fibrillation, rapid      ventricular rate (started on amiodarone).  2. Direct current cardioversion on amiodarone, September 21, 2007.  3. Pulmonary vein isolation, ablation of atrial fibrillation, [facility], September 2009.  4. Amiodarone was discontinued on December 2009.  5. Recurrent atrial fibrillation, March 17, 2008.  6. Direct current cardioversion, April 13, 2008.  The patient has      been weak since that time.  7. The patient in the past has failed Tikosyn therapy as well as      flecainide therapy.  8. Current initial management will be rate control.  The patient will      be placed on IV Cardizem.   OTHER SECONDARY DIAGNOSES:  1. Dual-chamber pacemaker for tachybrady syndrome, November 2005.  2. Gastroesophageal reflux disease.  3. Ongoing tobacco habituation.  4. Chronic obstructive pulmonary disease, emphysema exacerbating      recovery this admission.  5. Dyslipidemia.  6. Remote cerebrovascular accident.  7. Obstructive sleep apnea/continuous positive airway pressure.  8. Diabetes.  9. Echocardiogram, April 2009, ejection fraction 50%-55%.  The patient      has diastolic congestive heart failure.  10.Class III chronic kidney disease.   OTHER OPTIONS THAT WERE CONSIDERED BUT NOT FOLLOWED THROUGH THIS  ADMISSION:  1.  The patient on Multaq and then direct current cardioversion.  2. AV node ablation.  The plan was, however, to restart Tikosyn and      then direct current cardioversion unless the patient had a      spontaneous pharmacologic cardioversion on Tikosyn itself.   HISTORY:  [name] is a 77 year old female with a past medical history  significant for atrial fibrillation.  She had an atrial fibrillation  ablation in September 2009.  She had recurrence of her atrial  fibrillation in January of 2010.  The patient has had cardioversions in  the past.  She now presents with weakness; weight gain; dyspnea, which  has been in place since an attempted cardioversion, April 23, 2008.   The patient now presents on April 30, 2008, with dyspnea on exertion,  weakness, weight gain, lower extremity edema, chest pressure.   HOSPITAL COURSE:  The patient presents electively on February 22.  She  has dyspnea on exertion, weight  gain as mentioned above.  She has  diastolic congestive failure.  She has COPD, emphysema.  She is found to  be in atrial fibrillation with moderately rapid rates.  She has been  started on IV Cardizem for rate control.  This has been switched to  Tikosyn therapy.  She did not convert to sinus rhythm pharmacologically.  She did eventually have, after dose #3 of her Tikosyn, a direct current  cardioversion on Friday, December 26 sustain a sinus rhythm at the time  of this dictation, which is 1730 hours, February 26.  Hopefully, she  will go home on February 27 in the morning after her fifth Tikosyn dose.  In addition, she is complaining of shortness of breath, difficulty  sleeping.  We have had a pulmonary consult on this admission as well as  a congestive heart failure consult.  The suspicion is that most of the  shortness of breath is secondary to COPD.  Xopenex and Atrovent  nebulizer therapies have been started.  Dr. Delford Field saw the patient on  February 25.  He thought that she had underlying COPD without  exacerbation, but progressive congestive heart failure in a setting of  atrial fibrillation.  It is thought that the patient once in sinus  rhythm will improve fairly quickly.  The patient will be assessed prior  to discharge for necessity to go home with home oxygen.  If her  saturations fall with ambulation, we will set her up with home oxygen.  The patient has been given prescriptions for all new medications, both  on the short term, monthly basis, and a 39-month basis.   LABORATORY STUDIES ON THIS ADMISSION:  Complete blood count:  White  cells 8.5, hemoglobin 11.5, hematocrit 33.9, platelets are 171.  Sodium  is 140, potassium is 4, chloride 106, carbonate 27, BUN is 12,  creatinine 0.94, glucose 109.  The patient's INR dropped during this  admission as low as 1.4, but the patient has been placed on Lovenox  therapy as backup throughout this hospitalization.  The patient remains   subtherapeutic.  By the time of discharge, February 27, we will arrange  for Lovenox therapy as an outpatient.  On February 26, the PT was 20.5,  INR 1.7.   MEDICATIONS:  1. Coumadin 4 mg daily to alternate with 6 mg daily.  2. Cardizem 240 mg daily.  3. Glipizide 10 mg daily.  4. Nexium 40 mg daily.  5. Lipitor 20 mg daily at bedtime.  6. Pletal 50 mg  twice daily, it is also called cilostazol.  7. Advair 250/50 two puffs daily.  8. Spiriva 80 mcg per inhalation, 1 inhalation daily.  9. Mucinex 600 mg, 2 in the morning, 2 in the evening.  10.A new medication, Tikosyn 250 mcg 1 tablet in the morning, 1 tablet      in the evening.  11.A new medication, Lasix, furosemide 20 mg daily, takes the place of      torsemide.  12.A new medication, potassium chloride 20 mEq daily.  Important to      keep her potassium up when she is taking Tikosyn.  13.A new medication, spironolactone 12.5 mg daily.  14.A new medication, Xopenex HFA 2 puffs every 6 hours as needed.  15.A new medication, Atrovent HFA 2 puffs every 6 hours as needed.  16.Lexapro 20 mg at bedtime.  17.Continue vitamin C daily.  18.Continue omega-3 fatty acids daily.  19.Continue magnesium and iron supplements daily.   MEDICATIONS TO STOP:  Stop torsemide and stop flecainide.   FOLLOWUP:  1. Follow up with [facility] [address] See      Dr. Graciela Husbands on Wednesday, March 3 at 1:30.  2. Coumadin Clinic would be Tuesday.  We will set her up for Tuesday,      March 2.  Coumadin Clinic will call the patient on Monday to set up      this appointment since all office is closed at this current time.      The patient also may require Lovenox 85 mg subcutaneous injection      every 12 hours over the weekend to include Monday until her INR is      therapeutic.      Maple Mirza, [place]      Duke Salvia, MD, [facility]  Electronically Signed    GM/MEDQ  D:  05/04/2008  T:  05/05/2008  Job:  [id number]   cc:    Deirdre Peer. Polite, M.D.  Charlcie Cradle Delford Field, MD, [facility]  Duke Salvia, MD, [facility]

## 2010-07-23 ENCOUNTER — Telehealth: Payer: Self-pay | Admitting: Internal Medicine

## 2010-07-23 NOTE — Telephone Encounter (Signed)
LMTC

## 2010-07-23 NOTE — Telephone Encounter (Signed)
Pt called and received letter from you.  She needs to speak to you regarding this letter.

## 2010-07-25 NOTE — Assessment & Plan Note (Signed)
 HEALTHCARE                           ELECTROPHYSIOLOGY OFFICE NOTE   NAME:TURNEROrilla, Templeman                       MRN:          [id number]  DATE:11/05/2005                            DOB:          08-Jul-1931    Durene Fruits comes in because she is retaining fluid particularly in her  left leg.  Her left leg swelling has been variable, sometimes worse than  now.   She has also seen Dr. Geralynn Rile in the last number of months, who found  that she had bilateral lower extremity vascular disease with mild ABI  limitations.  She was encouraged to continue her exercise.   We reviewed her salt and water intake.  Her water intake is modest.  Her  salt intake is unknown and she avoids salt but does not particularly tend  closely to her diet.  She does not cook with salt I should note.   She has no history of venous insufficiency.   Her medication list is legion and includes Coumadin, digoxin - dose recently  down titrated, Cardizem, glipizide and Actos recently started for the  diagnosis of diabetes, Aldactone added for __________ potassium in the  setting of her Tikosyn therapy and Nexium.  There are also a number of  nutraceuticals.  She also takes Demadex, potassium and Wellbutrin.   On examination her blood pressure is 131/64, pulse is 69.  Lungs were clear.  Heart sounds were regular.  The extremities had 2+ edema on the left, 1+  edema on the right.   IMPRESSION:  1. Lower extremity edema, left greater than right.  2. Atrial fibrillation, paroxysmal, on Tikosyn.  3. Bradycardia, status post pacemaker implantation.  4. Recent diagnosis of diabetes.   Ms. Crossland has significant edema, which is I guess currently not too bad  today.  I am concerned about its unilateral nature and while I think the  likelihood in the context of Coumadin therapy is low, excluding venous  obstructive disease, is appropriate.  Will get venous Dopplers.  I have also  suggested that on bad days she increase her Demadex to 2 pills a day and  increase her potassium to 3 pills.   We will see her again as previously scheduled.                                   Duke Salvia, MD, [facility]   SCK/MedQ  DD:  11/05/2005  DT:  11/06/2005  Job #:  [id number]   cc:   Armanda Magic, MD

## 2010-07-25 NOTE — H&P (Signed)
NAME:  [name], [name]                        ACCOUNT NO.:  [id number]   MEDICAL RECORD NO.:  [id number]                   PATIENT TYPE:  EMS   LOCATION:  MAJO                                 FACILITY:  [facility]   PHYSICIAN:  Richard A. Alanda Amass, M.D.          DATE OF BIRTH:  17-May-1931   DATE OF ADMISSION:  12/19/2002  DATE OF DISCHARGE:                                HISTORY & PHYSICAL   CHIEF COMPLAINT:  Chest discomfort, tired.   HISTORY:  The patient is a 77 year old white female medical patient of Dr.  Susa Griffins and Dr. Felizardo Hoffmann of [facility]. The  patient was working in the yard yesterday, became tired, and went inside.  She felt mostly tired and laid down for most of the afternoon. She continued  to be tired that evening and went to bed early, although she was unable to  sleep very well. She continued to have some left chest discomfort, which she  said occurred when laying on her left side. She said when she got off of her  left side it improved. In fact, on arrival she wanted her discomfort to be  pleurisy. The patient has complained of discomfort of a lesser degree, which  persists, but has become better off of her left side. She still feels weak  and tired. She was sent to the ER by her daughter-in-law, Dr. Armanda Magic.  Her EKG on arrival showed atrial fibrillation with a heart rate between 110  and 140. She denied any tightness, radiation of pain, shortness of breath,  or diaphoresis. She had two transient episodes of nausea, which she  attributed to lack of food, last p.m. and again earlier this a.m. She has  been seen by Dr. Daphene Jaeger in the ER and she is admitted now to rule out MI  and control her heart rate.   PAST MEDICAL HISTORY:  1. Sick sinus syndrome and paroxysmal atrial fibrillation on Coumadin and     Flecainide.  2. Hyperlipidemia.  3. Chronic obstructive pulmonary disease with ongoing tobacco use.  4. Moderate pulmonary  hypertension.  5. Peripheral vascular disease with claudication status post tibial PTA     August 2001, antritis FA, PTA, and stenting.  6. 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  7. Hypertension.  8. History of a left cerebrovascular accident with dysphagia, which has now     resolved, approximately five years ago.  9. Adult onset diabetes, diet controlled.   REVIEW OF SYSTEMS:  CV: Negative. She has had carotid Dopplers August 08, 2002,  which were normal. PULMONARY:  She had no shortness of breath, no PND, no  orthopnea, no cough. CARDIAC:  As above. She had a Cardiolite study in June,  which was negative. GI:  Previously on Prevacid. Positive for increased  flatus. Some irregularity and occasional constipation. GU:  Negative.  EXTREMITIES:  Currently she can walk about a mile without difficulty. ABIs  were 0.93 and greater than 0.06 on August 08, 2002.   CURRENT MEDICATIONS:  1. Wellbutrin 150 mg daily.  2. Altace 1.25 mg daily.  3. Flecainide 100 mg b.i.d.  4. Digoxin 0.25 mg daily.  5. Vitamin C 500 mg b.i.d.  6. Vitamin D 500 mg b.i.d.  7. Fosamax 70 mg weekly.  8. Folic acid 1 mg daily.  9. Lipitor 40 mg p.o. h.s.   ALLERGIES:  No known drug allergies.   SOCIAL HISTORY:  ETOH social/rare. Cigarettes:  One half pack per day for 48  years. Drugs:  None.  The patient is married, lives with her husband. She  was a Psychologist, sport and exercise, is retired.   FAMILY HISTORY:  Mother died at age 38 with emphysema and alcohol abuse.  Father died at age 61 with an MI. She has two sisters in good health. Four  children in good health.   PHYSICAL EXAMINATION:  GENERAL:  This is a well-nourished, well-developed  white female in no acute distress.  VITAL SIGNS:  Blood pressure 91/53, heart rate 126-137 when she was first  placed on telemetry. Respiratory rate was 12. O2 saturation was 95% on room  air.  HEENT:  PERRLA. EOMs  intact. Fundi not visualized. Ears, nose, throat, and  mouth within normal limits.  NECK:  Supple. No bruits, no JVD, no palpable thyroid.  CHEST:  Clear to auscultation and percussion except for some basilar rales.  CARDIAC:  Normal S1. No murmurs or rubs.  ABDOMEN:  Soft. Nontender. Positive bowel sounds. No palpable thyromegaly.  EXTREMITIES:  No cyanosis, clubbing, or edema.  NEUROLOGIC:  Within normal limits. No focal deficit.   IMPRESSION:  1. Chest discomfort with atrial fibrillation, rule out MI.  2. History of sick sinus syndrome and paroxysmal atrial fibrillation     currently on Coumadin and Flecainide therapy.  3. Peripheral vascular disease with history of bilateral claudication.     History of PTA tibial disease, and right SFA PTA and stenting.  4. Mesenteric ischemia with IMA stenosis, celiac artery stenosis, and left     renal artery stenosis.  5. History of left cerebrovascular accident with dysphasia and numbness of     the face five years ago, now resolved.  6. Chronic obstructive pulmonary disease with ongoing tobacco use.  7. Hypertension and moderately pulmonary hypertension.  8. Hyperlipidemia.  9. Diet controlled adult onset diabetes mellitus.   PLAN:  1. Control rate with Cardizem.  2. Rule out myocardial infarction.  3. Further treatment as indicated.      Eber Hong, P.A.                 Richard A. Alanda Amass, M.D.    WDJ/MEDQ  D:  12/19/2002  T:  12/19/2002  Job:  [id number]

## 2010-07-25 NOTE — Discharge Summary (Signed)
[name], [name]              ACCOUNT NO.:  [id number]   MEDICAL RECORD NO.:  [id number]          PATIENT TYPE:  INP   LOCATION:  3705                         FACILITY:  [facility]   PHYSICIAN:  Duke Salvia, M.D.  DATE OF BIRTH:  [birth year]   DATE OF ADMISSION:  09/03/2004  DATE OF DISCHARGE:  09/07/2004                           DISCHARGE SUMMARY - REFERRING   HISTORY OF PRESENT ILLNESS:  [name] is a 77 year old female who presents  with recurrent atrial fibrillation with a controlled ventricular heart rate.  [name] is admitted for Tikosyn therapy. [name] has a long history of atrial  fibrillation despite Flecainide therapy. DC cardioversion on Jul 18, 2004  did not maintain sinus rhythm. Permanent pacemaker was installed January 23, 2004 for tachy-brady syndrome with beta blocker intolerance secondary to  bradycardia. [name] has also had a [employer] pacemaker ADX LDR at [facility].  Cardiac catheterization in 2004 showed non-obstructive coronary artery  disease with ejection fraction of 50%. [name] has infra-inguinal arterial  occlusive disease as well as dyslipidemia, peripheral vascular disease with  prior stenting, obstructive sleep apnea, and remote tobacco.   LABORATORY DATA:  Chest x-ray on the 28th showed cardiomegaly. No congestive  heart failure.   EKG's  on admission showed atrial fibrillation, premature ventricular  contractions. Subsequent EKG's showed ventricular pacing.   Admission weight was 179.7. Hemoglobin and hematocrit was 14.0 and 41.6.  Normal indices. Platelets 213,000. White blood cells 8.9. No subsequent  CBC's. Admission PT was 24.1, INR 2.1. Discharge PT was 25.8 and INR 2.3.  Admission sodium 139, potassium 3.5, BUN 17, creatinine 1.0, glucose 95.  Potassium was low on the 28th at 3.1, however, subsequent potassium's were  normal. Last chemistry on the 1st showed a sodium of 141, potassium 4.0, BUN  19, creatinine 1.2. Digoxin level on admission was  1.4.   HOSPITAL COURSE:  [name] was admitted to 3700 by Dr. Graciela Husbands. Tikosyn was  started at 250 mg q. 12 hours. Her Coumadin was continued. Potassium was  supplemented. Her Atenolol was decreased. Over the next several days, [name]  continued on Tikosyn without any evidence of ventricular tachycardia. [name]  converted to normal sinus rhythm. On September 06, 2004 Dr. Ladona Ridgel felt that [name]  could possibly be discharged home on September 07, 2004 and followup with Dr.  Tenny Craw. Case management assisted with discharge Tikosyn needs.   DISCHARGE DIAGNOSES:  1.  Recurrent atrial fibrillation with failed outpatient management.  2.  Tikosyn loading.  3.  Hypokalemia, resolved.   DISPOSITION:  [name] is discharged home.   DIET:  Maintain low-sodium diet.   DISCHARGE MEDICATIONS:  1.  Tikosyn 250 mcg q. 12 hours.  2.  Inderal LA 60 mg q.d.  3.  Continue all other home medications, except Dr. Ladona Ridgel asked her NOT to      take Atenolol.   FOLLOW UP:  [name] was asked to call our office to arrange a followup  appointment with Dr. Tenny Craw and Dr. Graciela Husbands in 3 to 4 weeks. Discharge  instructions were given to [name] by Dr. Ladona Ridgel.  Irving Burton   EW/MEDQ  D:  09/07/2004  T:  09/07/2004  Job:  [id number]   cc:   Marcene Duos, M.D.  [facility] [address]  [place]  [place] [address]  Fax: [phone]   Duke Salvia, M.D.

## 2010-07-25 NOTE — Procedures (Signed)
[facility]  Patient:    [name], [name]                     MRN: [id number] Proc. Date: 08/22/99 Adm. Date:  [id number] Disc. Date: [id number] Attending:  Ruta Hinds CC:         Richard A. Alanda Amass, M.D.             Armanda Magic, M.D.             Charles A. Tenny Craw, M.D.             James L. Randa Evens, M.D.             [facility]                           Procedure Report  PROCEDURES:  Retrograde abdominal aortic catheterization, right iliac angiography using contralateral crossover technique, right femoral angiogram, right SFA PTA and stent for high grade stenosis, complications of spiral dissection right external iliac artery sheath related treated with tandem self expanding nitinol stents as outlined in report, Plavix 300 mg IV, intermittent bolus heparin monitoring ACTs, aspirin, intermittent sedation with Nubain IV, 1% local xylocaine anesthesia.  COMPLICATIONS:  Right external iliac dissection successfully treated with overlapping tandem nitinol self-expanding cordis "smart stents" with restoration of normal flow.  SURGEON:  Richard A. Alanda Amass, M.D.  BRIEF HISTORY:  [name] has severe peripheral vascular disease. She also has a probable component of ischemic bowel. She is a heavy cigarette smoker and has COPD, sick sinus syndrome, and bilateral claudication.  She has been treated with flecainide successfully and has maintained sinus rhythm with chronic Coumadin therapy although she does have intermittent palpitations suggesting runs of PAF.  She has been worked up by Dr. Vilinda Boehringer who felt she might have a component of ischemic bowel. For peripheral and possible mesenteric disease, she underwent abdominal and peripheral angiography on May 15, 1999. This showed single patent renal arteries bilaterally, 40-50% eccentric smooth narrowing of her celiac axis proximally but excellent residual lumen, normal SMA, 50-70% segmental  narrowing of her ostial and proximal IMA and a 90% stenosis of the left colic artery mid portion. She had ulcerative atherosclerotic disease with 40-50% narrowing of the infrarenal abdominal aorta below the renal arteries but good flow.  She had 50% left hypogastric, 40% plaque-like right external iliac event in the external iliac. The hypogastrics were intact bilaterally and there was no significant left external iliac disease.  She had severe left tibial disease and required very complex intervention with PTA of her RAT, PTA of her right peroneal and PTA of her RPT and tibial peroneal trunk. This was done with double wires, kissing balloons and fortunately she had an excellent angiographic result and has done quite well with improvement in her ABIs of the left lower extremity and significant improvement in her walking tolerance now being limited by her claudication of the right lower extremity.  Her GI symptoms have improved significantly without any significant symptoms of postprandial pain to suggest ischemic bowel but she continues to smoke. She has had a trip to [place] where she did quite well both from the GI and a lower extremity standpoint but was limited by right lower extremity claudication. She has had a negative cardiolite and no symptoms of angina despite her smoking history, hyperlipidemia, and [name].  She is admitted now for elective angiography of her right lower extremity.  The LFA was entered with a single anterior puncture using an 18 thin wall needled. A short 6 French side arm sheath was inserted, a Wholey wire was used to negotiate the left iliac system under fluoroscopic control and a 5 [place] IMA catheter was used to access the right common iliac. Right iliac injection was done in the PA and oblique projection. The [facility] wire was then positioned in the right external iliac, right SFA entered easily under fluoroscopic control and the sheath was exchanged for a 7  [place] cordis Balkan crossover sheath. This was positioned across the iliac bifurcation without difficulty with the dilator removed. There was normal flow on hand injection to the proximal right femoral. Right SFA profunda spot film was done by hand with normal flow. Right lower extremity angiogram was then done by bolus chase technique at 50 cc, 10 cc per second with good visualization of the right lower extremity down to the right foot.  Angiography revealed 40% narrowing of the right external iliac which was unchanged with a slightly tortuous vessel, normal right common iliac and intact right hypogastric. The right SFA profunda junction was intact. There was 40% narrowing of the proximal right SFA with irregularity. There was 70-80% segmental narrowing of about 4-5 cm of the right SFA at [facility] canal which had not changed significantly. There were irregularities of the distal right SFA and right popliteal.  The right anterior tibial had approximately 70-80% smooth narrowing in its proximal portion. This was less than a prior study. The right tibioperoneal trunk had 50% narrowing and the right peroneal had an 80-90% proximal narrowing but was patent. There was, however, excellent flow through the right anterior tibial and the right posterior tibial artery. On the prior study, the right posterior tibial artery had been occluded and was now patent on this study and a fairly dominant vessel. There was however 50-70% tibioperoneal narrowing evident. Essentially there was 2-vessel runoff to the right foot with total occlusion of the mid right peroneal.  After this injection, hand injections were obtained and there was no distal flow. It became quite obvious that there was inflow obstruction. We then did a hand injection through the Balkan sheath and there was dissection just distal to the right hypogastric artery with 100% occlusion and no antegrade flow  through the external iliac. This  was felt to be a spiral dissection probably related to sheath dissection at the irregularities and proximal REIA. The patient fortunately had no discomfort with this.  The lesion was already crossed with a 0.035 inch Wholey wire which I felt confident was in the true lumen.  We dilated the REIA with a 5 mm x 6 cm cordis Powerflex balloon at low atmospheres, 6 atmospheres for 40 seconds. This did not establish antegrade flow and the dissection was more visible. We then elected to proceed with stenting starting at the hypogastric artery to see if the dissection would seal.  An 8 mm x 40 cm cordis smart stent was deployed at the right hypogastric origin fluoroscopically. This did not restore flow so a second tandem 8 mm x 40 cm cordis smart stent nitinol self expanding was then deployed in the mid REIA. There was improvement but there was still haziness and poor flow beyond this. There was also an area of stenosis at the right hypogastric so we put in an overlapping 9 mm x 40 mm smart stent overlapping the right hypogastric artery. The hopes were that this would help seal the proximal site  of the dissection which it did; however, there was persistent dissection beyond the REIA into the RCFA.  There was antegrade flow restored, however, which was slightly improved after dilatation with a 7 mm x 10 cm cordis Powerflex balloon at low pressures 4-40 and 3-60 and then 7-40.  There was still dissection in the right common femoral which we felt we would approach after repairing the right SFA. This was primarily stented with a 6 mm x 60 mm cordis smart stent which was positioned under fluoroscopic control. There was a localized dissection proximal to this so a second overlapping stent was required in the distal SFA 6 mm x 4. These stents were post dilated with a 5 mm x 6 cm cordis Powerflex balloon at 8-50. Scout injection showed good results. We then did spot films of the  tibioperoneal system. There was overlap of the proximal RAT but excellent filling. There was 50-70% narrowing of the right tibioperoneal, 90% narrowing of the right peroneal proximally and 50% narrowing of the RPT which was smooth beyond the right peroneal with excellent flow. We then reapproached the right common femoral area. This was dilated at low pressures with a 7 mm x 10 cm cordis Powerflex in the RCFA at 5 atmospheres for 60 seconds, 5 atmospheres for 300 seconds, 4 atmospheres for 300 seconds and then 4 atmospheres for 600 seconds. Despite this, there was still haziness in the RCFA area and we felt that there was poor repair of this dissected artery in this segment and the risk of occlusion at this area was too high. For this reason, we were forced to deploy a distal overlapping 7 mm x 4 cm stent at the RCFA just beyond the femoral head. The tandem stents were then post dilated with a 7 mm x 10 cm cordis Powerflex balloon.  There were 4 tandem stents placed from the distal RCIA across the hypogastric to the RCFA in total. These were 8 mm self expanding cordis nitinol smart stents.  There were 2 self expanding nitinol overlapping stents placed in the right SFA, a 6 mm x 60 mm and a 6 mm x 20 mm. Excellent flow throughout the entire right iliac system was present, TIMI-3 with no evidence of residual dissection. Hand injection of the distal right SFA showed excellent angiographic result with 0% residual stenosis and no dissection and runoff below at the popliteal.  The patient was asymptomatic at this time. During the period of transient right iliac occlusion, she had some knee discomfort but this was minor and transient and there was no pain when flow was restored. There was no angiographic evidence of atheroemboli or residual thrombus.  During the procedure, she received a total of 5000 units of heparin. Her arterial pressures were monitored and ranged 150-170 mmHg and she  maintained sinus rhythm. She received 300 mg of Plavix p.o., one aspirin. Visipaque dye was used throughout the procedure. Urine output was monitored. She was kept well hydrated. She received a total of 4 mg of Nubain for sedation.  We plan to keep her on aspirin and Plavix tonight for antiplatelet therapy, discontinue her sheath today when ACT normalizes. We will then restart her on her chronic Coumadin therapy and probably continue Plavix for her multiple stents at least short-term and possibly long-term. Follow-up right lower extremity Dopplers. We did not do any angiography at the left lower extremity because of dye considerations and the fact that she had improved significantly clinically since her last procedure.  CATHETERIZATION  DIAGNOSIS:  1. Atherosclerotic peripheral vascular disease--bilateral claudication.  2. Status post complex trifurcation left tibial angioplasty May 15, 1999     with significant clinical and ABI improvement.  3. Persistent claudication right lower extremity.  4. Successful percutaneous transluminal angioplasty and stent, tandem stent     distal right superficial femoral artery.  5. Spontaneous opening of RPT with 2-vessel runoff, right lower extremity     and improvement in RAT stenosis and flow.  6. Procedural complication right external iliac to right common femoral,     spiral dissection treated successfully with tandem overlapping nitinol     self expanding stents with good angiographic and initial clinical result,     outpatient follow-up and surveillance to be done.  7. History of ischemic bowel symptoms with moderate inferior mesenteric     artery proximal stenosis and high-grade left ______ artery stenosis;     40-50% celiac eccentric narrowing with good flow; ulcerative infrarenal     atherosclerotic disease.  8. Chronic obstructive pulmonary disease, continued smoking abuse.  9. Sick sinus syndrome, paroxysmal atrial fibrillation, stable on  flecainide    and chronic Coumadin. 10. Negative cardiolite, no angina. 11. Hyperlipidemia. 12. Hypertension. DD:  08/22/99 TD:  08/27/99 Job: [id number]

## 2010-07-25 NOTE — Assessment & Plan Note (Signed)
[facility]                               PULMONARY OFFICE NOTE   NAME:[name], [name]                       MRN:          [id number]  DATE:11/20/2005                            DOB:          08/23/1931    [name] returns today in followup, still smoking a pack a day of  cigarettes, has underlying chronic obstructive lung disease, asthmatic  bronchitis, severe sleep apnea, reflux.   Maintains currently:  1. Spiriva daily.  2. Advair 250/50 1 spray b.i.d.  Other maintenance medicines are correct as reviewed without change.   PHYSICAL EXAMINATION:  Temp 98, blood pressure 118/58, pulse 78, saturation  95% on room air.  CHEST:  Showed distant breath sounds with prolonged expiratory phase, no  wheeze or rhonchi are noted.  CARDIAC:  Exam showed a regular rate and rhythm without S3, normal S1, S2.  ABDOMEN:  Soft, nontender.  EXTREMITIES:  Showed no edema or clubbing.  SKIN:  Clear.  NEUROLOGIC:  Exam was intact.  HEENT:  Exam showed no jugular venous distention, no lymphadenopathy.  The  oropharynx is clear.  NECK:  Supple.   IMPRESSION:  Chronic obstructive lung disease with stable airway function  despite active smoking.   PLAN:  Maintain inhaled medicines as currently dosed, and we will see the  patient back in return followup in 3 months.                                   Charlcie Cradle Delford Field, MD, FCCP   PEW/MedQ  DD:  11/20/2005  DT:  11/22/2005  Job #:  [id number]   cc:   Ladell Pier, M.D.

## 2010-07-25 NOTE — Discharge Summary (Signed)
NAME:  [name], [name]                        ACCOUNT NO.:  [id number]   MEDICAL RECORD NO.:  [id number]                   PATIENT TYPE:  INP   LOCATION:  4732                                 FACILITY:  [facility]   PHYSICIAN:  Richard A. Alanda Amass, M.D.          DATE OF BIRTH:  1931/09/06   DATE OF ADMISSION:  12/19/2002  DATE OF DISCHARGE:  12/20/2002                                 DISCHARGE SUMMARY   ADMISSION DIAGNOSES:  1. Unstable angina.  2. History of paroxysmal atrial fibrillation and sick sinus syndrome.  3. Chronic Coumadin anticoagulation.  4. Hyperlipidemia.  5. Chronic obstructive pulmonary disease with ongoing tobacco use.  6. Moderate pulmonary hypertension.  7. Peripheral vascular disease with history of tibial percutaneous     transluminal  angioplasty in August 2001 and right superficial femoral     artery percutaneous transluminal angioplasty stent in the past.  8. History of ischemic bowel with internal mammary artery stenosis.  9. Left renal artery stenosis with 40 to 50% iliac artery stenosis.  10.      Hypertension.  11.      Diabetes mellitus, type 2, diet controlled.  12.      History of questionable stroke about five years ago, symptoms now     resolved.   DISCHARGE DIAGNOSES:  1. Unstable angina.  2. History of paroxysmal atrial fibrillation and sick sinus syndrome.  3. Chronic Coumadin anticoagulation.  4. Hyperlipidemia.  5. Chronic obstructive pulmonary disease with ongoing tobacco use.  6. Moderate pulmonary hypertension.  7. Peripheral vascular disease with history of tibial percutaneous     transluminal  angioplasty in August 2001 and right superficial femoral     artery percutaneous transluminal angioplasty stent in the past.  8. History of ischemic bowel with internal mammary artery stenosis.  9. Left renal artery stenosis with 40 to 50% iliac artery stenosis.  10.      Hypertension.  11.      Diabetes mellitus, type 2, diet controlled.  12.      History of questionable stroke about five years ago, symptoms now     resolved.  13.      Status post cardiac catheterization on December 20, 2002, by Dr.     Lenise Herald. No obstructive coronary artery disease, ejection fraction     50%,, patent right iliac and superficial femoral artery stents.   HISTORY OF PRESENT ILLNESS:  [name] is a 77 year old white female who  presented to [facility] December 19, 2002, with chief complaint of chest  discomfort and increased fatigue.   She said she had been walking in the yard on the previous day when she got  extremely tired and had to go inside.  She felt fatigued and had to lie  down.  She continued to feel fatigued and went to bed early that evening.  However, she lay down, she did develop left chest discomfort which seemed  to  get worse with  lying on her left side and seemed to improved when she moved  off the left side.  She continued to have some discomfort in that area but  at a lesser degree when she was off her left side.  She continued to feel  weak and tired.  She had spoken with her daughter-in-law, cardiologist, Dr.  Armanda Magic, and she told her to go to the emergency room.   EKG on arrival showed atrial fibrillation, heart rate 110 to 140.  At that  point, she was denying any tightness in her chest, shortness of breath, or  diaphoresis.  She did have  some transient nausea but attributed that to  lack of eating.   On exam at that time, blood pressure 91/53, heart rate 126 to 137.  Exam was  notable for irregular tachycardic heart rhythm.  No other significant  abnormality on exam.   At this point, she was evaluated by Dr. Daphene Jaeger.  Plan was felt she needed  rate control of her atrial fibrillation.  Check serial enzymes, rule out MI.  In addition to atrial fibrillation, EKG also showed left bundle branch block  with pole changes.  At that point, planned to start IV Cardizem bolus and  drip for rate control  of atrial fibrillation, check serial enzymes, rule out  MI.  It was felt she was at high risk of having coronary artery disease  given her ongoing tobacco, history of peripheral vascular disease, diabetes,  hyperlipidemia.  IF the atrial fibrillation continued, less than 48 hours  duration, and INR therapeutic, then we would consider TEE cardioversion.  As  well, we would decide on need for cardiac catheterization pending her  cardiac enzymes and  other symptomatology.  It was noted the patient was  planning to go to [place],  [place], the following morning to attend her  son's wedding.   HOSPITAL COURSE:  On the morning of December 21, 2002, [name] is feeling  better, having no chest pain or shortness of breath.  She is now in sinus  rhythm and has converted from atrial fibrillation.  EKG now shows sinus  rhythm, sinus bradycardia, that has some new T wave inversions V3 through  V6.  EKG the previous day had shown left bundle branch block but now change.  Dr. Tresa Endo was uncertain if these changes were secondary to a recent  tachycardia versus secondary to anterolateral ischemia.  At that point, Dr.  Tresa Endo did not yet have an INR back for that morning; however, the INR for  the previous day had been 1.7.  He discussed his findings with Dr. Armanda Magic.  It was felt that given these EKG changes, her peripheral vascular  disease, tobacco use, diabetes, hyperlipidemia, that we would plan to  recheck STAT INR.  If INR low enough (1.6), we would plan for  catheterization later in the day.  We otherwise would have to plan for  cardiac catheterization the following morning.  We would need to discharge  the patient from the hospital as soon as possible given she was supposed to  be going to [place] the following morning for her son's wedding.   On December 20, 2002, [name] underwent cardiac catheterization by Dr. Lenise Herald.  She was found to have very minimal coronary artery  disease,  EF 50%.  Previous stent placed to the right iliac and superficial femoral  artery were patent.  At that point, Dr. Jenne Campus  used Perclose to the left  femoral artery without complications.  She tolerated procedure well without  complications.  He planned for her to be discharged home later that evening  after her bed rest was complete.  She would get one Lovenox injection in the  hospital that evening, and she would get one Lovenox injection as outpatient  the following morning.  If further Lovenox was needed based on INRs, this  could be supplied by family members in [place] as they were also  physicians, and that was the plan.  Planned to restart her Coumadin.  Would  also resume her digoxin and low-dose diltiazem for recurrent atrial  fibrillation.   CONSULTATIONS:  None.   PROCEDURES:  Cardiac catheterization  performed December 20, 2002, by Dr.  Lenise Herald.  She was found to have nonobstructive [name] with LAD having  mild 40% narrowing in the mid portion.  The left circumflex had a 30%  proximal stenosis.  The RCA had a new distal 30% stenosis.  There was no  other significant disease.  EF was 50%.  The previously placed stent to the  right iliac and superficial femoral artery were widely  patent.  She  tolerated procedure well and had no complications.   Initial EKG in the emergency room showed atrial fibrillation 120 to 130  beats per minute, left bundle branch block.  Followup EKG showed sinus  rhythm with sinus bradycardia at 53 beats per minute with some very mild T  wave inversion in V2 through V5 which were new.   Chest x-ray December 19, 2002, showed mild interstitial prominence, bibasilar  atelectasis.   White count 7.4, hemoglobin 14.9, hematocrit 43.8, platelets 180.  Pro time  17.5, INR 1.7 on admission.  INR on December 20, 2002, at 1300 was 1.7 which  is the last INR before catheterization.  Sodium 139, potassium 3.7, glucose  152, BUN 145, creatinine  0.9.  Liver functions tests were normal.  Lipid  profile shows total cholesterol 110, triglycerides 241, HDL 29, LDL 33.  TSH  2.086.  Digoxin level 1.6.  Flecainide level 0.24.  Cardiac markers in the  ER showed myoglobin 107 and 95.9.  CK-MB 0.0, 3.0.  Troponin 0.05, less than  0.05 x 2.   DISCHARGE MEDICATIONS:  1. One Lovenox injection the following morning which was going to be an 80     mg injection.  2. Cardizem CD 120 mg once a day.  3. Digoxin 0.125 mg a day.  4. Coumadin 10 mg tonight, then go back to usual dosing which is 7.5 mg     daily except for 10 mg on Mondays.  5. Wellbutrin 150 mg once a day.  6. Altace 1.25 mg once a day.  7. Flecainide 100 mg twice a day.  8. Vitamin C 500 mg twice a day.  9. Calcium Plus D 600 mg twice a day.  10.      Fosamax 70 mg once a week.  11.      Folic acid 1 mg a day.  12.      Lipitor 40 mg a day.   DISCHARGE INSTRUCTIONS: 1. No strenuous activity, lifting greater than 5 pound, driving, or sexual     activity for three days.  2. May gently wash groin site with warm water and soap.  3. Call [phone] for any bleeding or increase in groin site.  4. Have blood drawn in four to five days to check a PT/INR.  5.  Follow up with Dr. Alanda Amass October 25 at 4:15 p.m.      Mary B. Easley, P.A.-C.                   Richard A. Alanda Amass, M.D.    MBE/MEDQ  D:  01/04/2003  T:  01/04/2003  Job:  [id number]   cc:   [facility]   Marcene Duos, M.D.  [address] [place]  [place] [address]  Fax: [phone]

## 2010-07-25 NOTE — Discharge Summary (Signed)
[name], [name]              ACCOUNT NO.:  [id number]   MEDICAL RECORD NO.:  [id number]          PATIENT TYPE:  INP   LOCATION:  4734                         FACILITY:  [facility]   PHYSICIAN:  Richard A. Alanda Amass, M.D.DATE OF BIRTH:  1931-11-20   DATE OF ADMISSION:  01/13/2004  DATE OF DISCHARGE:  01/18/2004                                 DISCHARGE SUMMARY   DISCHARGE DIAGNOSES:  1.  Paroxysmal atrial fibrillation, flecainide discontinued this admission.  2.  Chronic obstructive pulmonary disease.  3.  Nonobstructive coronary artery disease by catheterization October 2004.  4.  Treated hypertension.  5.  Diabetes, diet controlled.  6.  Normal left ventricular function.   HOSPITAL COURSE:  The patient is a 77 year old female followed by Dr.  Alanda Amass with a history of PAF and COPD.  She has had previous peripheral  vascular disease with a right iliac and right superficial femoral artery  stenting.  Catheterization in October 2004 showed a 40% LAD.  She has normal  LV function.  She has COPD with recurrent exacerbations.  She also has PAF.  She has been on flecainide and in and out of sinus rhythm.  She presented  January 13, 2004, with increasing shortness of breath. Chest x-ray on  admission showed pulmonary edema.  EKG showed sinus rhythm with a left  bundle branch block.  She was seen in consultation by the pulmonary service  on admission.  She was treated with IV diuretics.  Her BNP on admission was  only 89.  They were somewhat puzzled about heart failure on x-ray and BNP of  89.  She did improve with diuresis, and her BUN and creatinine remained  stable.  She developed recurrent atrial fibrillation.  After review with Dr.  Alanda Amass, it was felt she is now a flecainide failure.  There was some  discussion about using amiodarone, although at discharge Dr. Tresa Endo suggested  Rythmol may be a better agent.   For now, the plan is to discharge her and have her follow up with  Dr.  Alanda Amass as an outpatient.  She did have PFTs done prior to discharge which  showed a decrease in her DLCO which Dr. Delford Field felt was secondary to  emphysema.  Dr. Delford Field did suggest that she if she was put on amiodarone,  she would need PFTs with DLCO every three to six months.   DISCHARGE MEDICATIONS:  1.  Demadex 20 mg a day.  2.  Potassium 20 mEq a day.  3.  Cardizem CD 360 daily.  4.  Spiriva 18 mcg a day.  5.  Wellbutrin 150 b.i.d.  6.  Lanoxin 0.25 mg a day.  7.  Protonix 40 mg a day.  8.  Lipitor 40 mg a day.  9.  Os-Cal 500 mg a day.  10. Coumadin 5 mg a day, 7.5 Monday, Wednesday, and Friday.  11. Advair inhaler.  12. Flonase as taken at home.  13. CPAP.   She is to follow up with Dr. Delford Field in a couple of weeks.  She will see Dr.  Alanda Amass in a couple of weeks.  She will have pro time checked next week.  She has been instructed to stop flecainide and Lasix for now.       LKK/MEDQ  D:  01/18/2004  T:  01/19/2004  Job:  [id number]   cc:   Shan Levans, M.D. [facility]

## 2010-07-25 NOTE — Discharge Summary (Signed)
[name], [name]              ACCOUNT NO.:  [id number]   MEDICAL RECORD NO.:  [id number]          PATIENT TYPE:  INP   LOCATION:  4710                         FACILITY:  [facility]   PHYSICIAN:  Shan Levans, M.D. LHCDATE OF BIRTH:  10-07-1931   DATE OF ADMISSION:  12/13/2003  DATE OF DISCHARGE:  12/17/2003                                 DISCHARGE SUMMARY   DISCHARGE DIAGNOSES:  1.  Acute exacerbation of chronic obstructive pulmonary disease.  2.  Congestive heart failure.  3.  Rapid atrial fibrillation that is chronic in nature.  4.  Steroid-induced hyperglycemia.   HISTORY OF PRESENT ILLNESS:  Mrs. Daughtrey is a 77 year old white female, a  medical patient of Dr. Gerlene Burdock A. Alanda Amass, with known chronic obstructive  pulmonary disease with continued tobacco abuse, with a known decreased DLCO2  of less than approximately 50%, FEV-1 of less than 50% of predicted.  She  presented to the [facility] Emergency Department on December 13, 2003 with  increasing shortness of breath over 3 weeks, but with tan sputum and cough  that was increased, increased shortness of breath x12 hours, plus insomnia.  Her daughter-in-law thought she has had increased shortness of breath for  weeks.  She has increased her tobacco use in preparation for stopping  tobacco use.   She was seen by Dr. Shan Levans on December 11, 2003, treated with Omnicef,  prednisone and Advair.  She has known atrial fibrillation and was for  cardioversion on December 13, 2003, but due to her cardiac history, increasing  shortness of breath and a suspected acute exacerbation of COPD, she will be  admitted for further evaluation and treatment.   PAST MEDICAL HISTORY:  Her past medical history is significant for:  1.  Sick sinus rhythm, paroxysmal atrial fibrillation and chronic atrial      fibrillation, on Coumadin, flecainide and digoxin.  2.  Chronic obstructive pulmonary disease with continued tobacco abuse, on      Advair  500/50 b.i.d. and on recent steroid taper.  3.  Peripheral vascular disease with history of tibial PTA with stent.  4.  Small bowel stenosis.  5.  Left renal artery stenosis.  6.  Hypertension.  7.  Adult-onset diabetes mellitus.   LABORATORY DATA:  Hemoglobin 11.9, hematocrit 34.7, platelets are 250,000,  WBC is 13.7.  Sodium 141, potassium 4.0, chloride 108, CO2 28, glucose is  190, BUN is 20, creatinine of 1.1, albumin is 3.3.  Troponin is 0.02, CK 76.  Calcium is 8.0.  B-type natriuretic peptide is 151.8.  Flecainide level was  0.25.  TSH is 1.519.   Chest x-ray on December 15, 2003 demonstrates cardiomegaly, diffuse  interstitial prominence again noted, unchanged, likely mild interstitial  edema, slight decrease in bibasilar atelectasis, probable small effusions.   CT of the sinuses:  Clear paranasal sinuses.   Twelve-lead EKG shows atrial fibrillation with a rapid ventricular response.   HOSPITAL COURSE:  PROBLEM #1 - ACUTE EXACERBATION OF CHRONIC OBSTRUCTIVE  PULMONARY DISEASE WITH CONTINUED TOBACCO USE:  She was treated with the  usual pharmaceutical regimen, IV steroids, IV antibiotics  with nebulized  bronchodilators and supplemental oxygen.  She reached maximal hospital  benefit by December 17, 2003 and was ready for discharge home.  She will be  able to decrease steroid taper.  She will return to her p.o. medications as  given prior to her admission of Omnicef and follow up with Dr. Shan Levans.   PROBLEM #2 - CONGESTIVE HEART FAILURE:  This is felt to be the main focus of  her abrupt or acute shortness of breath.  She was aggressively diuresed,  coming in at a weight of 185 pounds and quickly returned to 181 pounds with  diuresis and with a marked improvement in her pulmonary stenosis, and a  decrease in her bibasilar crackles.   PROBLEM #3 - RAPID ATRIAL FIBRILLATION:  She is better continued on  increased Cardizem at 360.  She is for direct cardioversion in the  future by  Dr. Pearletha Furl. Alanda Amass; this will be up to his discretion.   PROBLEM #4 - HYPERGLYCEMIA SECONDARY TO STEROID EXACERBATION:  Her Lantus  was discontinued prior to discharge; her glucoses have returned to the high  100s and will be evaluated on an outpatient basis.   DISCHARGE MEDICATIONS:  1.  Coumadin 7.5 mg, except Thursday, when she takes 10 mg daily.  2.  Cardizem 360 mg daily -- note that this is an increased dose from 240      mg.  3.  Lipitor 40 mg daily.  4.  Digoxin 0.25 mg daily.  5.  Omnicef 600 mg 1 a day until gone.  6.  Prevacid 30 mg 1 daily.  7.  Folic acid 1 daily.  8.  Flonase 2 puffs daily to each nostril.  9.  Advair 500/50 one puff 2 times a day.  10. Spiriva 18 mcg 1 cap per day.  11. Lasix 40 mg a day.  12. Kay Ciel 20 mEq once a day.  13. Flecainide 250 mg daily.  14. Fosamax 70 mg 1 weekly.  15. Wellbutrin 150 mg 3 times a day.  16. Prednisone on a taper -- 40 mg for 2 days, 30 mg for 2 days, 20 mg for 2      days, 10 mg for 2 days and then stop.  17. Nicotrol inhaler as instructed p.r.n.   SPECIAL INSTRUCTIONS:  No smoking.   FOLLOWUP:  She is to follow up with Dr. Shan Levans on December 24, 2003  at 2:55 p.m.  She is to make an appointment to see Dr. Alanda Amass about  impending cardioversion and to follow up for her congestive heart failure.   DISPOSITION/CONDITION ON DISCHARGE:  Improved.      Stev   SM/MEDQ  D:  12/17/2003  T:  12/17/2003  Job:  [id number]   cc:   Gerlene Burdock A. Alanda Amass, M.D.  [phone] [address]  [place]  [place] [address]  Fax: [phone]   Marcene Duos, M.D.  [phone] [address]  [place]  [place] [address]  Fax: [phone]

## 2010-07-25 NOTE — Assessment & Plan Note (Signed)
 HEALTHCARE                             PULMONARY OFFICE NOTE   NAME:TURNERJaven, Ridings                       MRN:          [id number]  DATE:06/08/2006                            DOB:          12/20/1931    Ms. Chrestman is a 77 year old white female.  History of chronic  obstructive lung disease, asthmatic bronchitic component, still actively  smoking, and has no plans in the immediate future to quit, smoking a  pack and a half to 1 pack a day.   Maintains Advair 250/50 one spray b.i.d.  She occasionally forgets to  take the 2nd dose.  She is on Spiriva daily.  Other maintenance  medicines listed in the chart are correct as reviewed.   EXAMINATION:  Temperature 97.8.  Blood pressure 110/68.  Pulse 74.  Saturation was 94% room air.  CHEST:  Showed distant breath sounds, prolonged expiratory phase.  No  wheeze or rhonchi were noted.  CARDIAC:  Exam showed a regular rate and rhythm without S3.  Normal S1  and S2.  ABDOMEN:  Soft and non-tender.  EXTREMITIES:  Showed no edema or clubbing.  SKIN:  Was clear.   IMPRESSION:  Is that of chronic obstructive lung disease, emphysematous  and asthmatic bronchitic components with ongoing active smoking.   PLAN:  For the patient is to maintain Advair on a b.i.d. dosing basis.  She was reminded to try to remember to use her Advair twice daily, and  she will also maintain Spiriva daily.  She will place her inhalers next  to her toothbrush, and hopefully this will prompt her in remembering to  take the Advair inhaler on a b.i.d. dosing basis.     Charlcie Cradle Delford Field, MD, [facility]  Electronically Signed    PEW/MedQ  DD: 06/08/2006  DT: 06/08/2006  Job #: [id number]

## 2010-07-25 NOTE — Procedures (Signed)
[place]  Patient:    [name], [name]                     MRN: [id number] Proc. Date: 01/22/00 Adm. Date:  [id number] Attending:  [name] Mustard CC:         Julieanne Manson, M.D.  Beather Arbour Thomasena Edis, M.D.  Richard A. Alanda Amass, M.D.  Armanda Magic, M.D.   Procedure Report  PROCEDURE:  Colonoscopy.  MEDICATIONS:  Unasyn 1.5 g IV due to her history of multiple cardiac and peripheral vascular stents, fentanyl 50 mcg, Versed 5 mg IV.  SCOPE:  Olympus adult colonoscope.  INDICATION:  Previous history of adenomatous colon polyps with two polyps in the rectum removed a year ago and previous polyps in the cecum.  This is done as follow-up.  DESCRIPTION OF PROCEDURE:  The patient had been explained to the patient and consent obtained.  With the patient in the left lateral decubitus position, the Olympus adult video colonoscope was inserted following digital rectal exam, inserted under direct visualization.  The prep was excellent.  We were able to advance to the cecum using abdominal pressure and position change until the ileocecal valve seen.  The cecum was carefully examined, and no polyps were seen.  The scope was withdrawn and the cecum, ascending colon, hepatic flexure, transverse colon, splenic flexure, descending and sigmoid colon were seen well upon removal.  There were no polyps seen, and scattered diverticula.  Scope withdrawn.  The patient tolerated the procedure well.  ASSESSMENT:  No evidence of further colon polyps.  RECOMMENDATION:  Recommend repeating in three years, with routine postcolonoscopy instructions. DD:  01/22/00 TD:  01/22/00 Job: [id number]

## 2010-07-25 NOTE — Discharge Summary (Signed)
NAMEESTEFANA, [name]              ACCOUNT NO.:  [id number]   MEDICAL RECORD NO.:  [id number]          PATIENT TYPE:  INP   LOCATION:  2036                         FACILITY:  [facility]   PHYSICIAN:  Duke Salvia, MD, FACCDATE OF BIRTH:  Jul 14, 1931   DATE OF ADMISSION:  04/30/2008  DATE OF DISCHARGE:  05/09/2008                               DISCHARGE SUMMARY   ALLERGIES:  This patient has no known drug allergies.   Time for this dictation greater than 1 hour 15 minutes.  This patient  has failed flecainide in the past, also has failed Tikosyn in the past.   FINAL DIAGNOSES:  1. Admit with acute on chronic diastolic congestive heart failure.      a.     Heart failure exacerbated by atrial fibrillation, rapid       ventricular rate, first response was rate controlled with IV       Cardizem.      b.     IV Lasix diuresis this admission with loss of 16.5 pounds       during this hospitalization.  2. Restarted on Tikosyn 250 mcg twice daily this admission.  3. Direct-current cardioversion on May 04, 2008, to sinus rhythm.  4. Continue Coumadin therapy.  5. The patient has continuing tobacco habituation/chronic obstructive      pulmonary disease.  6. Pulmonary Consult.      a.     Prednisone started at 40 mg with taper over a 2-week period.      b.     Arterial blood gases, on May 08, 2008, 7.418/carbonate       22.6/PA CO2 35.8/PAO2 65.1.  Saturation 93.3% on 2 liters.      c.     Home oxygen.      d.     Albuterol inhaler switched to Xopenex.      e.     Follow up with Dr. Sherene [name], on May 23, 2008.  7. Neurology Consult:  Diplopia.      a.     Workup included carotid duplex study, which showed no       internal carotid artery stenosis on the left, the right internal       carotid artery had a proximal 40-59% stenosis.  Vertebral arteries       were antegrade.      b.     Venous Doppler studies showing no deep vein thrombosis.  No       superficial thrombosis.  No Baker cyst  on May 08, 2008.      c.     May 08, 2008, CT of the head, old left parietal infarct, no       acute infarct, no intracranial hemorrhage, no mass effect, no       hydrocephalus.      d.     CT angiogram showing left calcific stenosis 50-75% in the       cavernous segment.      e.     Suspect latent strabismus:  Should do well.[name]   BACKGROUND FOR THIS ADMISSION:  1. The  patient had been on Tikosyn therapy in the past.  This was      stopped on May 06, 2007.  2. Hospitalized in April 2009 with acute on chronic diastolic      congestive heart failure exacerbated by atrial fibrillation, rapid      ventricular rate started on amiodarone at that time.  3. The patient had direct current cardioversion on amiodarone on September 20, 2008.  4. Pulmonary vein isolation with ablation of atrial fibrillation at      [facility] in September 2009.  5. Amiodarone stopped in December 2009.  6. Recurrent atrial fibrillation on March 17, 2008.  7. Direct current cardioversion on April 23, 2008.  The patient has      felt weak and short of breath since that procedure.   SECONDARY DIAGNOSES:  1. Dual-chamber pacemaker for tachybrady syndrome in November 2005.  2. Gastroesophageal reflux disease.  3. Ongoing tobacco habituation.  4. Chronic obstructive pulmonary disease/emphysema.  5. Dyslipidemia.  6. Remote cerebrovascular accident.  7. Obstructive sleep apnea/continuous positive airway pressure with      oxygen.  8. Diabetes.  9. Echocardiogram in April 2009, ejection fraction 50-55%.  10.Class III chronic kidney disease.   PROCEDURES THIS ADMISSION:  1. May 04, 2008, direct current cardioversion to sinus rhythm.      The patient had been started on Tikosyn 36 hours prior to this      procedure.  2. Carotid duplex studies as mentioned above.  The right internal      carotid artery had proximal 40-59% stenosis, left internal carotid      artery no  appreciable stenosis.  Vertebral arteries were antegrade.      This was done on May 08, 2008.  3. Venous Doppler study on May 08, 2008, no deep vein thrombosis.  No      superficial thrombus, no Baker cyst.  4. CT of the head on May 08, 2008, old left parietal infarct, no      acute intracranial process.  5. CT angiogram of the head on May 08, 2008, left calcific stenosis      50-75% in the cavernous segment.   BRIEF HISTORY:  [name] is a 77 year old female.  She has history  significant for atrial fibrillation, status post radiofrequency catheter  ablation in  September 2009.  She had a recent cardioversion on April 23, 2008.  She has had a history of acute on chronic systolic congestive  heart failure admissions in the past.  She has nonobstructive coronary  artery disease.  She has COPD.   [name] underwent direct current cardioversion on April 23, 2008.  Since that time, she has had intermittent weakness, shortness of breath,  lower extremity edema, weight gain, and chest pressure.  These symptoms  started the day after her cardioversion.  They have improved slightly,  but they have been much more pronounced in the past 3 days.   On admission, April 30, 2008, chest pressure got worse and she  presented for further evaluation.  She described the chest pain is left-  sided.  It is a pressure, it is predominantly anterior.  There is  occasional radiation towards the back.  She denies diaphoresis, nausea,  or vomiting.   She cannot identify any aggravating or relieving factors.  She has also  noticed increasing lower extremity edema.  She is also more short of  breath.  She sleeps in  the hospital bed and she has her head elevated  all times.  Her weight is up about 8 pounds and so she increased her  home torsemide dose.  She did improve in her weight, but no improvement  in her symptoms.  She denies fevers or chills.  He has had a chronic  cough that maybe  slightly increased.  She does continue to smoke.  Our  plan will be to admit this patient.  She now presents with signs and  symptoms of congestive heart failure in the setting of atrial  fibrillation, rapid ventricular response.  Unfortunately, the patient  was reconverted to atrial fibrillation despite cardioversion on April 23, 2008.  Of note, this cardioversion was done off amiodarone  antiarrhythmic.   We will diurese the patient with IV diuretics.  We will continue with  congestive heart failure monitoring and care.  Cardiac enzymes will be  cycled.  She will be treated with full dose aspirin.  She will continue  her warfarin.  TSH will be checked so will lipid panel and hemoglobin  A1c.  The patient presents on flecainide.  This will be held.  Rate  control will be attempted with diltiazem.   HOSPITAL COURSE:  The patient presents on April 30, 2008, with acute  on chronic diastolic congestive heart failure, weight gain, lower  extremity edema.  She was found to be in atrial fibrillation, rapid  ventricular rate, which is exacerbating her symptoms.  Her flecainide  has been held after suitable washout and the patient being on IV  Cardizem.  She was seen by Dr. Sherryl Manges who recommended beginning  Tikosyn therapy with rate cardioversion after 36 hours on that  medication.  In addition, the patient has required aggressive diuresis  and she has lost almost 17 pounds during this admission and this is  represented by increasingly improved ventilatory status.  She has been  seen by Pulmonary Critical Care this admission.  They changed her  albuterol inhaler to Xopenex.  They also recommended prednisone with  taper and agreed that perhaps the predominant amount of her troubles was  from volume overload.  The patient did successfully undergo direct  current cardioversion on May 04, 2008, to sinus rhythm.  She did  have one brief episode of 4 hours into atrial fibrillation  after that on  the evening of May 07, 2008, but rapidly reconverted to sinus rhythm  and she has been in sinus rhythm since that time.  The patient was being  ready for discharge on May 06, 2008, but did stay the weekend due  to continued shortness of breath.  Efforts to diurese have been stepped  up over that weekend and the patient was on Monday's visit May 07, 2008, complaining of diplopia that she had had for several days.  Neurology consult was obtained.  Their ultimate conclusion was that she  had latent strabismus and should do well, but in the meantime, they  ordered several tests including carotid Dopplers, which were negative  except for right internal carotid artery stenosis of 40-59%.  CT of the  head, CT angiogram, and venous Dopplers.  They also ordered several  laboratory studies and RVR, which was negative vitamin B1 which is  pending at the time of this discharge, vitamin B12 which was 397 within  normal limits and estimated sed rate which was 25 only moderately higher  and acetylcholine receptor antibody, which is pending and angiotensin-  converting enzyme, which are  also pending.  The patient was seen by Dr.  Graciela Husbands with extensive conversation on May 09, 2008, ready for discharge.  He adjusted her outgoing Lasix dose, outgoing potassium dose and went  over her medications prior to discharge.   Medications that she has been on prehospital admission are as follows.  1. Glipizide 10 mg daily.  2. Nexium 40 mg daily.  3. Lipitor 20 mg daily at bedtime.  4. Pletal 50 mg twice daily.  5. Spiriva 18 mcg per inhalation 1 inhalation daily.  6. Mucinex 600 mg 2 tablets in the morning and 2 tablets in the      evening.  7. Advair 250/50 inhaler twice daily.  8. Nasonex 2 sprays daily at bedtime.  9. Lexapro 20 mg daily at bedtime.  10.Vitamin C daily.  11.Omega-3 fatty acid capsules daily.  12.She is to continue iron and magnesium supplements that she has.   13.Percocet 5/325 one to two tablets every 4-6 hours as needed.   Those are her preadmission medications and she will continue them.   New medications are as follows.  1. Tikosyn 250 mcg 1 tablet in the morning 1 tablet in the evening.      She is to take Tikosyn twice daily without fail.  2. Furosemide 60 mg twice daily that is 1 to 1/2 tablets in the      morning, 1/2 tablets in the evening.  3. Potassium chloride 20 mEq in the morning and 10 mEq or 1/2 tablet      in the evening.  4. Spironolactone 12.5 mg daily.  5. Xopenex HFA 2 puffs every 6 hours as needed.  This replaces her      albuterol inhaler.  6. Ativan 1 mg orally three times daily.  7. Diltiazem 120 mg daily at bedtime.  This is a new dose replacing      her 240 mg daily.  8. Adult aspirin 325 mg orally.   She has prescriptions for all of these medications which are new as I  mentioned.  She also goes home with home oxygen per home health and she  goes home with prednisone.  This is a separate item here.  She is on a  prednisone taper.  I have given her calendar with the amount of  prednisone tablets 10 mg she should take every day.  Basically, she is  taking 30 mg Wednesday on May 09, 2008, to Friday, May 11, 2008, and  she starts 20 mg on Saturday, May 12, 2008, to Monday, May 14, 2008,  then 10 mg 1 tablet Tuesday May 15, 2008, to Thursday, May 17, 2008,  and then 1/2 mg or 5 mg Friday, May 18, 2008, to Sunday, May 20, 2008, and she stops.  She is also asked to stop the following  medications that he has been taking to stop torsemide, to stop  flecainide, to stop Cardizem at the 240-mg dose, and to stop Wellbutrin.  She is also on Coumadin.  She goes home on 5-mg tablets.  She is to take  1 tablet daily on Wednesday, 1 tablet on Thursday.  She [facility] to the  [facility] on Friday, April 23, 2008, and they will get a copy  of her dosing during this admission.   1. She has a followup with  [facility], [address], [address]; and [facility] Friday, May 11, 2008, at 2:15 blood      work will  also include basic metabolic panel.  2. To see Dr. Graciela Husbands and his physician assistant on Wednesday, May 16, 2008, at 12:30, and she has an appointment with Dr. Delford Field on      May 23, 2008, which is [facility] [place] Day at 1:30 at [facility].  She has also asked several times on her pink sheets      which is #5,  to weigh herself daily and to call Dr. Graciela Husbands if her      weight increases by 5 pounds, maybe even before that.  Once again      she goes home with home oxygen.  Some laboratory studies were taken      this admission, BNP on May 06, 2008, was 160.  Her TSH on      admission April 30, 2008, was 1.384.  Hemoglobin A1c was 6.3.      Troponin I studies were all less than 0.01.  The patient is on      Tikosyn, so it is important that her potassium stays at a level of      above 4 at all times.  That is why she has a basic metabolic panel      coming up at the [facility].  As mentioned above, her RPR was      negative.  Vitamin B12 397.  Erythrocyte sedimentation rate is 25.      Protime on the day of discharge is 1.9.  Alkaline phosphatase this      admission 53, SGOT 24, SGPT is 25.  CBC this admission was      hemoglobin 11.5, hematocrit 33.9, white cells 8.5, platelets of      171.  Her last basic metabolic panel on May 08, 2008, sodium 141,      potassium 5.1, chloride 105, carbonate 30, glucose 91, BUN is 17,      creatinine 0.99.      Maple Mirza, [place]      Duke Salvia, MD, [facility]  Electronically Signed    GM/MEDQ  D:  05/09/2008  T:  05/10/2008  Job:  [id number]   cc:   Duke Salvia, MD, [facility]  Charlcie Cradle Delford Field, MD, FCCP

## 2010-07-25 NOTE — Assessment & Plan Note (Signed)
[facility]                             PULMONARY OFFICE NOTE   NAME:TURNERIndria, [name]                       MRN:          [id number]  DATE:03/15/2006                            DOB:          05/09/1931    Mr. Leask is a 77 year old white female with advanced chronic  obstructive lung disease, emphysematous and asthmatic bronchitic  components, still smoking greater than a pack a day of cigarettes.  She  has been coughing for 2 weeks, productive of clear mucus, occasionally  some yellow tinge.  Notes increased post-nasal drainage.   1. Maintains Advair 250/50, but only 1 spray daily.  2. Spiriva daily.  Other maintenance medicines listed in the chart and correct as reviewed.   EXAM:  Temp was 97, blood pressure 96/60, pulse 75, saturation 96% on  room air.  CHEST:  Diminished breath sounds with no evidence of wheeze or rhonchi.  CARDIAC:  Regular rate and rhythm without S3.  Normal S1, S2.  ABDOMEN:  Soft.  Nontender.  EXTREMITIES:  No edema or clubbing.  SKIN:  Clear.  NEUROLOGIC:  Intact.  HEENT:  No jugular venous distension.  No lymphadenopathy.  Oropharynx  clear.  NECK:  Supple.   IMPRESSION:  Chronic obstructive lung disease with asthmatic bronchitic  components.  Stable at this time, but active smoking is problematic.   PLAN:  This patient is to maintain Advair, but increase to 1 spray twice  daily.  Maintain Spiriva daily.  I will see the patient back in return  followup.     Charlcie Cradle Delford Field, MD, [facility]  Electronically Signed    PEW/MedQ  DD: 03/15/2006  DT: 03/15/2006  Job #: [id number]   cc:   Ladell Pier, M.D.

## 2010-07-25 NOTE — Op Note (Signed)
[name], [name]              ACCOUNT NO.:  [id number]   MEDICAL RECORD NO.:  [id number]          PATIENT TYPE:  INP   LOCATION:  3705                         FACILITY:  [facility]   PHYSICIAN:  Duke Salvia, M.D.  DATE OF BIRTH:  April 11, 1931   DATE OF PROCEDURE:  01/23/2004  DATE OF DISCHARGE:                                 OPERATIVE REPORT   PREOPERATIVE DIAGNOSIS:  Tachy-brady syndrome.   POSTOPERATIVE DIAGNOSIS:  Tachy-brady syndrome.   OPERATION PERFORMED:  Dual chamber pacemaker implantation.   SURGEON:  Duke Salvia, M.D.   DESCRIPTION OF PROCEDURE:  Following the obtaining of informed consent, the  patient was brought to the electrophysiology laboratory and placed on the  fluoroscopic table in supine position.  After routine prep and drape,  lidocaine was infiltrated along the prepectoral subclavicular region.  Incision was made and carried down to the layer of the prepectoral fascia  using the electrocautery and sharp dissection.  A pocket was formed  similarly.  Hemostasis was obtained.  Thereafter attention was turned to  gaining access to the extrathoracic left subclavian vein which was  accomplished without difficulty, without the aspiration of air or puncture  of the artery.  Two guidewires were placed and retained.  A 0 silk suture  was placed in figure-of-eight fashion and allowed to hang loosely.  Subsequently, sequentially two [place] introducer sheaths were placed through  which was then passed a passive fixation [employer] 58 cm ventricular lead  serial number, model 1646T serial number [id number] and a passive fixation  atrial lead model 1642 46 cm length serial number [id number].  Under  fluoroscopic guidance they were manipulated to the right ventricular apex  and the right atrial appendage respectively where the bipolar R-wave was 11  mV with pacing impedance of 641 ohms, a pacing threshold of 0.5 V at 0.5  msec, current at threshold of 0.4 mA and there was  no diaphragmatic pacing  at 10V.   The bipolar fibrillation wave was between 1.5 mV and 3.7 mV with pacing  impedance of 397 ohms.  With these acceptable parameters recorded, the lead  was secured to the prepectoral fascia and then attached to a [employer]  Identity ADXXLDR model 5386 dual chamber pacemaker, serial [id number].  Intermittent ventricular pacing was identified.   With these acceptable parameters recorded, the system was implanted.  The  pocket was copiously irrigated with antibiotic containing solution.  Hemostasis was assured.  The leads and the pulse generator were placed in  the pocket and secured to the prepectoral fascia.  The wound was then closed  in three layers in normal fashion.  The wound was washed, dried and a  benzoin Steri-Strip dressing was applied.  Sponge count, needle count and  instrument count were correct at the end of the procedure according to the  staff.   The patient tolerated the procedure without apparent complications.  The  patient will be transferred back to the floor initiated on beta blocker  therapy.       SCK/MEDQ  D:  01/23/2004  T:  01/24/2004  Job:  [id number]   cc:   Armanda Magic, M.D.  [address], [address]  [place], [place] [address]  Fax: [phone]   Marcene Duos, M.D.  [phone]  [place]  [place] [address]  Fax: [phone]

## 2010-07-25 NOTE — Progress Notes (Signed)
[facility]                        PERIPHERAL VASCULAR OFFICE NOTE   NAME:TURNERWinifred, [name]                       MRN:          [id number]  DATE:06/24/2006                            DOB:          Jun 29, 1931    HISTORY OF PRESENT ILLNESS:  [name] is a 77 year old lady.  She is  the mother-in-law of Dr. Armanda Magic.  She has paroxysmal atrial  fibrillation managed by Dr. Graciela Husbands with Tikosyn.  She also has  atherosclerotic peripheral arterial disease which was previously cared  for by Dr. Alanda Amass.  In March of 2001, she underwent angioplasty of  the left anterior tibial, posterior tibial, and peroneal arteries.  She  returned in June of that year for stenting of the right superficial  femoral artery.  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   I met her in 2007 for recurrence of bilateral calf claudication.  It  seemed, however, that her mobility was limited more by knee pain than by  claudication.  She has been moderately compliant with the exercise  therapy that I recommended.  She has not really been compliant with the  Pletal but likes to have it around just in case.  She has not had any  tissue loss or rest pain.   CURRENT MEDICATIONS:  1. Pletal 100 mg twice daily which she takes rarely.  2. Demadex 20 mg daily.  3. Potassium 40 mEq daily.  4. Wellbutrin SR 75 mg twice daily.  5. Vitamin C.  6. Calcium.  7. Vitamin D.  8. Fosamax 70 mg weekly.  9. Folate 400 mEq daily.  10.Cardizem CD 240 mg daily.  11.Advair.  12.Spiriva.  13.Tikosyn 0.25 mg twice daily.  14.Coumadin.  15.Digoxin 0.125 mg daily.  16.Fish oil 1000 mg twice daily.  17.Nexium.  18.Glucosamine.  19.Mucinex.  20.Aldactone 25 mg daily.  21.Lipitor 80 mg daily.  22.Glipizide 7.5 mg  daily.   PHYSICAL EXAMINATION:  GENERAL:  She is generally well-appearing and in  no distress.  VITAL SIGNS:  Heart rate 68, blood pressure 110/60 and equal  bilaterally.  Weight is 148 pounds which is stable.  NECK:  She has no jugular venous distension, thyromegaly, or  lymphadenopathy.  Carotid pulses 2+ bilaterally without bruit.  LUNGS:  Clear to auscultation, though expiratory phase is prolonged.  CARDIAC:  She has nondisplaced point of maximal cardiac impulse.  There  is a regular rate and rhythm with normal S1 and S2.  ABDOMEN:  Soft, nondistended, nontender.  There is no  hepatosplenomegaly.  Bowel sounds are normal.  EXTREMITIES:  Warm, without clubbing, cyanosis, edema, or ulceration.  DP and PT pulses are not palpable on either side.   IMPRESSION/RECOMMENDATIONS:  1. Minimally lifestyle-limiting claudication in the setting of      bilateral lower extremity vascular disease.  Her worst disease is      below the knees.  On most recent check, the ankle-brachial indices  were only mildly abnormal.  I have recommended continuing with      conservative management.  2. Atrial fibrillation.  Managed by Dr. Graciela Husbands.  3. Chronic obstructive pulmonary disease with active smoking.  Again,      recommended complete cessation.  She is not interested in this.   I will have her follow up with Dr. Tonny Bollman in six months.     [name], MD  Electronically Signed    WED/MedQ  DD: 06/24/2006  DT: 06/25/2006  Job #: [id number]   cc:   Duke Salvia, MD, [facility]  Armanda Magic, M.D.

## 2010-07-25 NOTE — Discharge Summary (Signed)
NAME:  [name], [name]                        ACCOUNT NO.:  [id number]   MEDICAL RECORD NO.:  [id number]                   PATIENT TYPE:  INP   LOCATION:  4703                                 FACILITY:  [facility]   PHYSICIAN:  Richard A. Alanda Amass, M.D.          DATE OF BIRTH:  26-Aug-1931   DATE OF ADMISSION:  10/23/2003  DATE OF DISCHARGE:  10/25/2003                                 DISCHARGE SUMMARY   ADMISSION DIAGNOSES:  1.  Increased shortness of breath.  2.  Paroxysmal atrial fibrillation.  3.  Coumadin anticoagulation.  4.  Chronic obstructive pulmonary disease.  5.  Pulmonary hypertension.  6.  Ongoing tobacco use.  7.  Diabetes mellitus.   DISCHARGE DIAGNOSES:  1.  Increased shortness of breath, status post CT scan of chest on October 25, 2003 with no pulmonary embolism.  2.  Paroxysmal atrial fibrillation.      1.  Telemetry this admission with no recurrent atrial fibrillation.      2.  Status post 2-dimensional echocardiogram this admission with          ejection fraction of 55 to 65%.  Mild to moderate mitral          regurgitation. Mild to moderate tricuspid regurgitation.  3.  Coumadin anticoagulation.  4.  Chronic obstructive pulmonary disease.      1.  Pulmonary function tests this admission to be followed up by          pulmonary.  5.  Pulmonary hypertension.  6.  Ongoing tobacco use.  7.  Diabetes mellitus.   HISTORY OF PRESENT ILLNESS:  The patient is a 77 year old white female who  presented on the evening of October 23, 2003 secondary to shortness of  breath.  It was noted the patient had presented with increased fatigue and  chest pain in October 2004 which led to admission and cardiac  catheterization.  At that time she was found to have normal coronary  arteries and normal left ventricular function with ejection fraction of 50%.  She was noted to have paroxysmal atrial fibrillation and sick sinus syndrome  at that time.   She had a follow up visit  in May of 2005 and was noted to be in atrial  fibrillation. Her medications were adjusted but she became weaker and about  one month ago her medications were once again changed but her weakness  persisted and she was scheduled for elective cardioversion on Thursday,  October 25, 2003.   She now presents and states that last [place] while at the beach she had  further progression of weakness with marked decrease in exertion tolerance.  She also noted marked dyspnea on exertion, ankle swelling, chest tightness,  dry mouth and decreased bowel movements.  She took some expired albuterol  with subjective improvement, however, yesterday she was unable to shop and  became more concerned about her shortness of breath.  She called Dr.  Kandis Cocking office on the day of admission and they recommended evaluation  in the emergency room.   On examination at that time she appeared in no acute distress.  No  significant abnormalities on the physical examination.  Her  electrocardiogram showed sinus bradycardia.  Her labs were stable including  a BNP of 224.   At this time we planned for admission to telemetry, follow her rhythm.  We  gave intravenous Lasix the night of admission and otherwise planned to  continue home medications and planned for Dr. Alanda Amass, et .al to see in  the morning.   HOSPITAL COURSE:  On the morning of October 24, 2003 the patient was  complaining of feeling weak and tired.  Her lungs are clear.  There is no  lower extremity edema.  We noted she was in sinus rhythm, reviewed her  normal BNP level, negative cardiac enzymes.  Other labs were stable without  significant abnormalities.  At this time we planned to obtain a 2-  dimensional echocardiogram to assess left ventricular function and valves to  evaluate for possible congestive heart failure.  We also noted that she  continues to smoke and we planned for pulmonary consultation while the  patient was in the hospital as she had  planned to see them as an outpatient  anyway.   On October 25, 2003 she underwent a CT scan that was negative or pulmonary  embolism.  On October 24, 2003 she underwent echocardiography that showed  ejection fraction 55 to 65%, mild to moderate MR.  She had mild to moderate  TR also.   On October 25, 2003 she was seen in pulmonary consultation.  She was  evaluated by Dr. Shan Levans.  At this point he feels she has chronic  dyspnea in part on the basis of congestive heart failure with likely  diastolic dysfunction or hypertensive heart disease and also chronic atrial  fibrillation with resultant lack of atrial rhythm resulting in exacerbation  of cardiac function, but also advanced chronic obstructive pulmonary disease  with asthmatic bronchitis with resultant pulmonary hypertension on the basis  of chronic lung disease.  Plan to rule out pulmonary embolism but also to  evaluate for moderate mitral regurgitation contributing to the degree of  pulmonary hypertension on the post capillary basis.  There was active  smoking aggravating  lower airway inflammation at this point.   At this time he recommended he had given intravenous Solu-Medrol.  He  planned to begin Advair 250/50 one spray b.i.d., Spiriva one capsule daily.  He would obtain a full set of pulmonary function studies.  He will obtain  air blood gases and obtain a spiral  CT scan of the chest to rule out  pulmonary embolism.   The above were done. Pulmonary function tests were performed.  It was felt  from the pulmonary standpoint that she could have this testing done in the  hospital and then be discharged home and followed up as an outpatient.   Later on date of October 25, 2003 she was seen and evaluated by Dr. Caprice Kluver  and he deemed her stable for discharge home.  At this point she is in sinus  rhythm with few PAC's.  She is still with a cough but is breathing much better.  Her blood pressure is 121/65, heart rate 68,  oxygen saturation 94%  on room air.  She does have decreased breath sounds bilaterally but no  wheezing.  Heart  is in regular rhythm with no significant ectopy.  It was  noted that the pulmonary function tests showed little response to  bronchodilators.  Planned to have outpatient follow up with pulmonary.   CONSULTATIONS:  Pulmonary consultation with Dr. Shan Levans on October 24, 2003.  At this time his impression is:  Chronic dyspnea partially on basis  of congestive heart failure with likely diastolic dysfunction or  hypertensive heart disease and also chronic atrial fibrillation with  resultant lack of atrial rhythm resulting in exacerbation of cardiac  function.  However, there is also advanced chronic obstructive pulmonary  disease with asthmatic bronchitis and resultant pulmonary hypertension on  the basis of chronic lung disease.  He planned to rule out pulmonary  embolism and also evaluate moderate mitral regurgitation contributing to the  degree of pulmonary hypertension on the post capillary basis.  He also noted  that active smoking aggravating the lower airway inflammation.   At this point the patient had been given intravenous Solu-Medrol and Dr.  Delford Field plans to use Advair, Spiriva, obtain full set of PFT's.  As well he  will get a CT scan of the chest to rule out pulmonary embolus.   LABORATORY DATA:  Cardiac markers negative.  Digoxin level  0.7, low.  BNP  224.9.  on admission the sodium was 141, potassium 4.2, glucose 160, BUN 14,  creatinine 1.1.  Pro Time 24.7, INR 3.1.  White blood cell count 11.1,  hemoglobin 11.4, hematocrit 33.6, platelet count 189,000.  ABG's show pH  7.421, bicarb 26.5, pCO2 40.8.   Chest x-ray on October 23, 2003 showed congestive heart failure with  interstitial edema and small bilateral effusions.  CT scan of chest on  October 25, 2003 shows (1) no evidence of pulmonary thromboembolism (2)  bilateral effusions, moderate on the right,  small on left, (3) mild  mediastinal hilar adenopathy, follow up study recommended, (4) patchy ground-  glass densities in the right middle lobe and lingula, nonspecific.   Her electrocardiogram showed sinus bradycardia, 53 beats per minute, non-  specific ST-T changes.  She remained in sinus rhythm throughout the  admission.  No atrial fibrillation.   Pulmonary function tests were performed on October 25, 2003.  A 2-dimensional  echocardiogram was done on October 24, 2003 and shows (1) ejection fraction  of 55 to 65%.  There is mild to moderate mitral regurgitation, mild to  moderate tricuspid regurgitation.   DISCHARGE MEDICATIONS:  1.  Avelox 400 mg one daily.  2.  Prednisone 10 mg tablets, take four a day for three days, take three a      day for three days, then two a day for three days and then one a day for      three days and then stop.  3.  Advair 250/50 one puff twice a day.  4.  Spiriva one capsule daily. 5.  Coumadin as before.  6.  Digoxin as before.  7.  Flecainide as before.  8.  Toprol XL 12.5 mg once daily.  9.  Cardizem as before.  10. Fosamax as before.  11. Lipitor as before.  12. Vitamin C, folic acid, Prevacid, calcium with D, Wellbutrin, Omega 3      fish oil and glucosamine are all to be continued as before.   DIET:  Low sodium, low fat, low caffeine diet.  Limit your alcohol.   ACTIVITY:  As tolerated.   DISCHARGE INSTRUCTIONS:  Stop smoking.   FOLLOW UP:  Patient is to follow up with Dr. Shan Levans November 08, 2003 at 1:30 P.M. She is to see Dr. Alanda Amass on December 10, 2003 at 10:00  A.M.   As well, she will need to follow up with Dr. Felizardo Hoffmann at [place].      Mary B. Easley, P.A.-C.                   Richard A. Alanda Amass, M.D.    MBE/MEDQ  D:  11/19/2003  T:  11/19/2003  Job:  [id number]   cc:   Armanda Magic, M.D.  [address]  [place], [place] [address]  Fax: [phone]   Shan Levans, M.D. [facility]   Marcene Duos, M.D.  [phone] [address]  [place]  [place] [address]  Fax: [phone]

## 2010-07-25 NOTE — H&P (Signed)
NAMEANIHYA, TUMA NO.:  [id number]   MEDICAL RECORD NO.:  [id number]          PATIENT TYPE:  EMS   LOCATION:  MAJO                         FACILITY:  [facility]   PHYSICIAN:  Theressa Millard, M.D.    DATE OF BIRTH:  1931/09/25   DATE OF ADMISSION:  01/20/2004  DATE OF DISCHARGE:                                HISTORY & PHYSICAL   Ms. Carmical is a 77 year old white female admitted with nausea, mild  dehydration, and rapid atrial fibrillation.   She has had recent admissions over the last several months secondary to  COPD, congestive heart failure, secondary to diastolic dysfunction and rapid  atrial fibrillation. Most recently, she was discharged on January 18, 2004,  with discontinuation of flecainide. Last night she had a very dry throat.  Today, she notes some increased mucus, but more importantly became nauseated  and vomited several times through the morning and early afternoon. She has  been unable to take her p.o. medications today and unable to keep any  fluids. She denies abdominal pain and diarrhea. Since her arrival in the  emergency department, she has been given Zofran and Phenergan and does feel  a little better. However, her BNP is up some, her INR is only 1.6, and her  atrial fibrillation is associated with a rapid ventricular rate in the 120s  and 130s; therefore, the patient is admitted.   She denies chest pain. She has had a cough productive of some sputum this  morning and this afternoon. She was recently diagnosed with obstructive  sleep apnea and is just beginning to use CPAP at 10 cmH20.   PAST MEDICAL HISTORY:  1.  Paroxysmal atrial fibrillation with a 4.3 cm left atrium in October      2005.  2.  COPD with November 2005 PFT showing FEV1 of less than 50% of normal and      a DLCO of less than 50% of normal.  3.  Coronary artery disease with 40% LAD in October 2004.  4.  Hypertension.  5.  Diet-controlled diabetes.  6.  Atherosclerotic  peripheral vascular disease, status post stenting.  7.  Renal artery stenosis and inferior mesenteric artery stenosis as well.  8.  History of stroke in 1999.  9.  Obstructive sleep apnea on CPAP of 10 cmH20.  10. Surgical stenting for atherosclerotic peripheral vascular disease.   MEDICATIONS:  1.  Demadex 20 mg daily.  2.  Potassium 20 mEq daily.  3.  Cardizem CD 360 mg daily.  4.  Spiriva 18 mcg one puff daily.  5.  Wellbutrin 150 mg b.i.d.  6.  Lanoxin 0.25 mg daily.  7.  Protonix 40 mg daily.  8.  Lipitor 40 mg daily.  9.  Os-Cal 500 mg daily.  10. Coumadin 5 mg on Saturday, Sunday, Tuesday, and Thursday, and 7.5 mg on      Monday, Wednesday, and Friday.  11. Advair 500/50.  12. Flonase daily.   SOCIAL HISTORY:  She quit smoking in September 2005. She is married. Her  daughter-in-law is Dr. Armanda Magic of [facility].  FAMILY HISTORY:  Noncontributory.   REVIEW OF SYSTEMS:  All other systems are negative.   PHYSICAL EXAMINATION:  GENERAL: She appears comfortable.  VITAL SIGNS: Blood pressure 90/50, pulse 105 to 130 on the monitor,  respiratory rate 24. Oxygen saturation 96% on two liters.  HEENT: Pupils equal, round, and reactive to light. Ears, nose, and throat  are unremarkable.  NECK: Supple. Thyroid not enlarged or tender.  CHEST: Decreased breath sounds.  CARDIAC: Irregular rhythm with a 2/6 holosystolic murmur.  ABDOMEN: Slight distention, but is nontender, normal bowel sounds.  EXTREMITIES: Without clubbing, cyanosis, or edema.  NEUROLOGIC: Oriented times three. Cranial nerves are intact. Motor is 5/5.   LABORATORY DATA:  BNP 194. INR 1.6.  Lanoxin level of 1.2. Lipase normal.  CMET normal. She had an albumin of 3.1. White count 17,000, hemoglobin 13.   Chest x-ray shows improved aeration compared to last week. Abdominal series  showed no obstruction.   EKG shows atrial fibrillation with a rapid ventricular rate and a left  bundle branch block.    IMPRESSION:  1.  Nausea and vomiting, ? etiology. The patient was significantly better      after Zofran and Phenergan. The problem right no is that she is a bit      volume depleted with a decreased blood pressure, increased hemoglobin. I      am not she can take p.o. medications which she needs to control her      heart rate. Therefore, she is admitted. She will be given IV fluids,      continue medications p.r.n. for nausea and vomiting. Give IV Cardizem to      control rate and begin p.o. medications as tolerated.  2.  Atrial fibrillation with rapid ventricular rate. Her INR on discharge      was 2.0 and now is 1.3. Coumadin has been ordered for today. Will need a      daily order for that. Will start Cardizem IV for rate control. She has      had no p.o. medications today. I will have Dr. Delrae Alfred call cardiology      tomorrow if I have not had to do so through the night.  3.  Chronic obstructive pulmonary disease. Stable.  4.  Obstructive sleep apnea. CPAP ordered.  5.  Diet-controlled diabetes.  6.  Diastolic dysfunction.     JO/MEDQ  D:  01/20/2004  T:  01/20/2004  Job:  [id number]

## 2010-07-25 NOTE — H&P (Signed)
[name], [name] NO.:  [id number]   MEDICAL RECORD NO.:  [id number]          PATIENT TYPE:  INP   LOCATION:  3705                         FACILITY:  [facility]   PHYSICIAN:  Duke Salvia, M.D.  DATE OF BIRTH:  [birth year]   DATE OF ADMISSION:  09/03/2004  DATE OF DISCHARGE:                                HISTORY & PHYSICAL   PRIMARY CAREGIVER:  Dr. Julieanne Manson.   ELECTROPHYSIOLOGIST:  Dr. Sherryl Manges.   ALLERGIES:  No known drug allergies.   HISTORY OF PRESENT ILLNESS:  [name] is a 77 year old female with  longstanding known  history of atrial fibrillation. She has had recurrent  atrial fibrillation despite flecainide therapy. She had DC cardioversion on  Jul 18, 2004 but did not maintain sinus rhythm. Although she said did not  feel much better after cardioversion in May, she has been very fatigued and  lethargic which she contributes to her rate controlling medications atenolol  and Cardizem. She presents today - September 03, 2004 - in atrial fibrillation  with heart rate in the 80s with occasional V pacing. The patient had a  permanent pacemaker implanted January 23, 2004 for tachybrady syndrome with  beta blocker intolerance (bradycardia). Her pacer is a [employer] ADx  XL DR at [employer]. Cardiac risk factors include known history of infrainguinal  arterial occlusive disease which is also a cardiac risk equivalent. She also  had a cardiac catheterization December 20, 2002 which showed nonobstructive  coronary artery disease. Her most significant stenosis is a 30% proximal AV  groove stenosis and a 30%  mid point right coronary artery stenosis.  Ejection fraction at that time was 50%.   MEDICATIONS:  1.  Atenolol 100 mg q.h.s.  2.  Demodex 20 mg daily.  3.  Potassium chloride - which she has not been taking, but she has 99 mg      tablets at home.  4.  Wellbutrin 150 mg b.i.d.  5.  Digoxin 0.25 mg daily.  6.  Vitamin C 500 mg twice  daily.  7.  Calcium with vitamin D 600 mg twice daily.  8.  Fosamax 70 mg weekly.  9.  Coumadin 7.5 mg Sunday, Tuesday, Thursday; 5 mg Monday, Wednesday,      Friday, Saturday.  10. Lipitor 40 mg daily at bedtime.  11. Advair 250/50 one inhalation at bedtime.  12. Folic acid 400 mcg daily.  13. Protonix 40 mg daily.  14. Cardizem 240 mg daily.  15. Omega-3 fish oil capsules 1000 mg twice daily.  16. Nasonex as needed.  17. Glucosamine 1500 mg in the morning, 1500 mg in the evening.  18. Spiriva 18 mcg per inhalation one spray at bedtime.  19. Mucinex 600 mg two tablets daily.  20. Colace two tablets daily.   SOCIAL HISTORY:  The patient lives in [place]. She quit smoking in  October 2005. She lives with her husband. She does not partake of alcoholic  beverages or recreational drugs. 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   FAMILY HISTORY:  Mother died at  age 12 in her sleep. Father died at age 38  of massive myocardial infarction. She has two sisters, one who has diabetes.   REVIEW OF SYSTEMS:  The patient has occasional night sweats. No fever or  chills, no weight change lately, no adenopathy. HEENT:  No nasal discharge  or epistaxis or progressive hoarseness. No vertigo, no photophobia.  INTEGUMENT:  No rashes or nonhealing ulcerations. CARDIOPULMONARY:  No chest  pain. Gets short of breath with dyspnea on exertion which she attributes to  her COPD/emphysema. She had claudication symptoms but she has had good  results with revascularization angioplasty of the lower extremities.  Occasionally feels palpitation but her main complaint with her atrial  fibrillation is lethargy. UROGENITAL:  No specific complaints of frequency  or urgency. NEUROPSYCHIATRIC:  No weakness or numbness, depression, anxiety.  She has lethargy on medication therapy. GI:  No history of GI bleeding, no  history of reflux disease. ENDOCRINE:  No history of diabetes or thyroid  disease.  MUSCULOSKELETAL:  No arthralgia or joint swelling. All other  systems are negative.   PHYSICAL EXAMINATION:  VITAL SIGNS:  Have not been taken as yet on this  presentation.  HEENT:  Normocephalic, atraumatic. Eyes:  Pupils equal, round, reactive to  light. Extraocular movements are intact. Sclerae are anicteric. Nares  without discharge. She wears both upper and lower dentures.  NECK:  Supple without carotid bruits auscultated. No thyromegaly, no jugular  venous distention. No cervical lymphadenopathy.  HEART:  Irregular rate and rhythm without murmur, rub, or gallop.  LUNGS:  Distant, not congested, and no wheezing.  ABDOMEN:  Soft, nondistended, bowel sounds are present. No rebound, no  guarding, no hepatosplenomegaly. Abdominal aorta nonpulsatile.  EXTREMITIES:  Show no evidence of clubbing, cyanosis, edema. No rashes or  lesions are present. Dorsalis pedis pulses surprisingly 5/5 bilaterally.  Radial pulses are 5/5 on the right, on the dampened at 3/5.  MUSCULOSKELETAL:  No joint deformity, effusion, kyphosis, scoliosis.  NEUROLOGIC:  Alert and oriented x3. Cranial nerves II-XII grossly intact.  Coordinated motion with good grip strength of both upper and lower  extremities.   A 12-lead electrocardiogram is pending. Chest x-ray has not been taken.  Laboratory studies are all pending.   ASSESSMENT:  1.  Admit for Tikosyn therapy to possibly spontaneously covert atrial      fibrillation or with direct current cardioversion.  2.  History of atrial fibrillation.      1.  Failed flecainide therapy.      2.  Failed direct current cardioversion Jul 18, 2004.  3.  Lethargy with atrial fibrillation/also possibly secondary to beta      blocker or calcium channel blocker.  4.  Chronic Coumadin.  5.  Left heart catheterization October 2004 nonobstructive coronary artery      disease, ejection fraction 50%. 6.  Status post implant of [employer] pacemaker for tachybrady syndrome,       November 2005.  7.  Infrainguinal arterial occlusive disease.      1.  Status post stents to the right lower extremity at the right          superficial femoral artery June 2001.      2.  Status post percutaneous transluminal coronary angioplasty of left          lower extremity March 2001.  8.  Dyslipidemia.  9.  Obstructive sleep apnea with continuous positive airway pressure.  10. Remote tobacco, having quit October 2005/chronic obstructive pulmonary  disease/emphysema.   PLAN:  STAT labs - CBC, BMET, PT/INR, magnesium, digoxin level, 12-lead EKG,  two-view chest x-ray. This is all to be done prior to initiating Tikosyn  therapy.      [name]   GM/MEDQ  D:  09/03/2004  T:  09/03/2004  Job:  [id number]

## 2010-07-25 NOTE — Assessment & Plan Note (Signed)
[facility]                         ELECTROPHYSIOLOGY OFFICE NOTE   NAME:[name], [name]                       MRN:          [id number]  DATE:04/05/2006                            DOB:          1931/12/04    [name] was seen today in the clinic on April 05, 2006 for followup  of her [place]. Jude model [phone] Identity. Date of implant was January 23, 2004 for tachybrady syndrome. On interrogation of her device today, her  battery voltage is 2.78, P waves measured 2.5 millivolts with an atrial  capture threshold of 0.5 volts at 0.4 msec and an atrial lead impedance  of 523 ohms. R waves measured greater than 12.5 millivolts with a  ventricular pacing threshold of 0.75 volts at 0.4 msec and a ventricular  lead impedance of 650 ohms. There were 347 mode switch episodes totaling  3.9% of the time and 55 ventricular high rate episodes. No changes were  made in her parameters. She will continue with her telephone checks  every 32-month basis with [employer] and a return office visit in 1 year.      Altha Harm, LPN  Electronically Signed      Duke Salvia, MD, [facility]  Electronically Signed   PO/MedQ  DD: 04/05/2006  DT: 04/05/2006  Job #: [id number]

## 2010-07-25 NOTE — Op Note (Signed)
[name], [name]              ACCOUNT NO.:  [id number]   MEDICAL RECORD NO.:  [id number]          PATIENT TYPE:  AMB   LOCATION:  ENDO                         FACILITY:  [facility]   PHYSICIAN:  James L. Malon Kindle., M.D.DATE OF BIRTH:  03/25/1931   DATE OF PROCEDURE:  06/02/2006  DATE OF DISCHARGE:                               OPERATIVE REPORT   PROCEDURE:  Colonoscopy.   MEDICATIONS:  Fentanyl 40 mcg, Versed 4 mg IV   INDICATIONS:  Patient with a history of adenomatous colon polyps with  high grade dysplasia in 2000, had a virtual colonoscopy that was  negative in 2005.  She has peripheral vascular disease, atrial  fibrillation, the heart was doing as well then and we could not stop her  Coumadin, now she is back in sinus rhythm and is still on Coumadin, but  is felt to be a satisfactory risk for colonoscopy, the Coumadin has been  on hold for five days.   DESCRIPTION OF PROCEDURE:  The procedure had been explained to the  patient and consent obtained.  In the left lateral decubitus position,  the adult Pentax scope was inserted and advanced. The prep was  excellent.  We were able to reach the cecum without difficulty.  The  ileocecal valve and appendiceal orifice were seen.  The scope was  withdrawn. The cecum, ascending, transverse, descending and sigmoid  colon were seen well, no polyps seen throughout, moderate diverticulosis  in the sigmoid colon.  The scope was withdrawn down in the rectum, the  rectum was free of polyps.  There were internal hemorrhoids seen on the  retroflex view.  The scope was withdrawn.  The patient tolerated the  procedure well.   ASSESSMENT:  1. History of colon polyps with negative colonoscopy at this time.  2. Diverticulosis.  3. Internal hemorrhoids.   PLAN:  Will resume Coumadin today.  Will recommend repeating colonoscopy  in five years if clinically appropriate.           ______________________________  Llana Aliment. Malon Kindle.,  M.D.     Waldron Session  D:  06/02/2006  T:  06/02/2006  Job:  [id number]   cc:   Deirdre Peer. Polite, M.D.  Duke Salvia, MD, [facility]  Charlcie Cradle. Delford Field, MD, FCCP

## 2010-07-25 NOTE — Discharge Summary (Signed)
[name], [name]              ACCOUNT NO.:  [id number]   MEDICAL RECORD NO.:  [id number]          PATIENT TYPE:  INP   LOCATION:  3705                         FACILITY:  [facility]   PHYSICIAN:  Marcene Duos, M.D.DATE OF BIRTH:  Jul 16, 1931   DATE OF ADMISSION:  01/20/2004  DATE OF DISCHARGE:  01/25/2004                                 DISCHARGE SUMMARY   DISCHARGE DIAGNOSES:  1.  Admitted with nausea and dehydration found to be atrial fibrillation,      rapid ventricular rate.  2.  Admitted January 13, 2004, with an exacerbation of congestive heart      failure in setting of severe chronic obstructive pulmonary      disease/atrial fibrillation rapid ventricular rate.  3.  Failure of antiarrhythmic flecainide.  4.  Sick sinus syndrome/tachybrady syndrome.  5.  Pacer implantation January 23, 2004, Duke Salvia, M.D.   SECONDARY DIAGNOSES:  1.  Left heart catheterization October 2004, no significant coronary artery      disease, ejection fraction 50%.  2.  Echocardiogram  October 2005, ejection fraction appears reasonable,      mild to moderate mitral regurgitation.  3.  Left bundle branch block rate related.  4.  Beta-blocker intolerance with fatigue and wheezes.  5.  Chronic obstructive pulmonary disease emphysema.  6.  Hypertension.  7.  Dyslipidemia.  8.  Type 2 diabetes.  9.  Obstructive sleep apnea with CPAP at night.  10. History of cerebrovascular accident in 1999 with some word finding      deficit lingering.  11. Claudication with infrainguinal arterial occlusive disease status post      PTA of the anterior tibial artery on March 2001 and stents in the right      superficial femoral artery and the right common iliac artery June 2001.  12. Aortoiliac occlusive disease with possible component of ischemic bowel.      The proximal internal mammary artery has a 50 to 70% stenosis.  The left      colic artery has 90% stenosis and the abdominal aorta below the  renal      arteries has a 40 to 50% stenosis.  This was done on imaging May 15, 1999.  13. Ongoing tobacco habituation.   PROCEDURE:  January 23, 2004, implantation of St. Jude Identity ADx XL DR  DDDR pacemaker by Duke Salvia, M.D.  The procedure was done without  complications.   DISPOSITION:  [name] discharging post procedure day #2 after  implantation of St. Jude pacemaker.  The patient is taking Coumadin 7.5 mg  Saturday, Sunday, Tuesday, and Thursday and 5 mg on Monday, Wednesday,  Friday.  At the time of discharge, the INR was 2.0.  The patient was in  atrial fibrillation but in controlled ventricular rate.  The patient had  follow-up at the pacer clinic  February 06, 2004, at 10:30 in the morning,  also at the Coumadin clinic.  The patient will follow up with Dr. Duke Salvia in three months.  The patient is discharged on the  following  medications.   DISCHARGE MEDICATIONS:  1.  Coumadin 5 mg tablets one and one half tablets every Tuesday, Thursday,      Saturday and Sunday; and one tablet every Monday, Wednesday, Friday.  2.  Demadex 20 mg daily.  3.  Wellbutrin SR 150 mg daily.  4.  Lanoxin 0.25 mg daily.  5.  Protonix 40 mg daily.  6.  Lipitor 40 mg daily at bedtime.  7.  Advair 500/50 one inhalation twice daily.  8.  Humibid LA one twice daily.  9.  Spiriva 18 mcg inhaled once daily.  10. Ferrous sulfate 375 mg twice daily.  11. Lopressor 50 mg in the morning and 50 mg in the evening.  12. Diltiazem CD 240 mg daily.  13. Colace 100 mg twice daily.   The patient is told to keep the incision dry until January 30, 2004, then  she may restart showers or tub baths.  In addition, she will follow up with  the Coumadin clinic Tuesday, January 29, 2004, at 12:45.  She will see Dr.  Delford Field Friday, February 15, 2004, at 3 o'clock in the afternoon.   BRIEF HISTORY:  Ms. [name] is a 77 year old female admitted with nausea and  mild hydration and  rapid atrial fibrillation.  She has had recent admissions  over the last several months secondary to her COPD and with exacerbation of  congestive heart failure.  Her heart failure is felt secondary to diastolic  dysfunction.  Also contributing is atrial fibrillation.  She was most  recently discharged January 18, 2004, with discontinuation of flecainide as  it seemed to have failed as an antiarrhythmic.  On the morning of this  admission, January 20, 2004, she noted increased mucus formation but more  importantly became nauseated and vomited several times through the morning  and early afternoon.  She has been unable to take her medications and unable  to keep down any fluids.  She denies abdominal pain or diarrhea.  Since  arrival in the emergency room, she feels better after having been given  Zofran and Phenergan.  Her BNP is elevated at 140.  Her INR is only 1.6 and  her atrial fibrillation is associated with rapid ventricular rate in the  120s and 130s.  The patient will be admitted.  She will obtain IV hydration  and continued medications for nausea and vomiting.  She will also have IV  Cardizem to control heart rate of her atrial fibrillation.  On admission,  her INR is subtherapeutic and she will be restarted on that. She will also  be seen by cardiology to see if they have any further input into this  patient's medical assessment.   HOSPITAL COURSE:  After arrival through the emergency room on January 20, 2004, with nausea, vomiting, dehydration and rapid atrial fibrillation, the  patient's rate was treated with IV Cardizem.  She was IV hydrated. She was  seen in consultation by Dr. Duke Salvia who felt that her atrial  fibrillation did contribute somewhat to the patient's decompensating  congestive failure symptoms.  Some thought that perhaps the patient would do  well with the beta-blocker, although she has had intolerance to that in the past.  It was also thought that  maybe she could benefit from AV node  ablation with pacemaker placement with radiofrequency catheter ablation of  her atrial flutter to follow.  After 48 hours in the hospital, the patient's  heart rate had been well controlled  into the 90s with a combination of  Cardizem and Lanoxin. The patient had been ruled out for myocardial  infarction with negative cardiac enzymes on admission.  After stabilization  on hospital day #4, the patient underwent implantation of St. Jude  pacemaker by Dr. Duke Salvia without complications.  The patient's INR  became therapeutic 36 hours after the procedure and the patient went home  two days after placement of pacemaker.  She goes home with medications and  follow-up as dictated.       GM/MEDQ  D:  03/06/2004  T:  03/06/2004  Job:  [id number]   cc:   Duke Salvia, M.D.   Wanda Plump, MD [facility]  [phone] [place], [place] [address]   Shan Levans, M.D. [facility]

## 2010-07-25 NOTE — Op Note (Signed)
[name], [name]              ACCOUNT NO.:  [id number]   MEDICAL RECORD NO.:  [id number]          PATIENT TYPE:  OIB   LOCATION:  2899                         FACILITY:  [facility]   PHYSICIAN:  Duke Salvia, M.D.  DATE OF BIRTH:  1931/08/12   DATE OF PROCEDURE:  07/18/2004  DATE OF DISCHARGE:  07/18/2004                                 OPERATIVE REPORT   PREOPERATIVE DIAGNOSIS:  Atrial fibrillation.   POSTOPERATIVE DIAGNOSIS:  Sinus rhythm.   PROCEDURE:  DC cardioversion and pacemaker interrogation with reprogramming.   SURGEON:  Duke Salvia, MD.   DESCRIPTION OF PROCEDURE:  The patient was submitted to general anesthesia  to the care of Dr. Jean Rosenthal.  She received 100 mg of intravenous Pentothal.  An 850-joule shock was delivered synchronously in atrial fibrillation and  restoring a sinus rhythm.   Her St. Jude's ADx pulse generator was reprogrammed from the DDI-R mode to  the DDD-R mode, and AF suppression was activated.      SCK/MEDQ  D:  07/18/2004  T:  07/19/2004  Job:  [id number]

## 2010-07-25 NOTE — Discharge Summary (Signed)
NAMEFIDELA, CIESLAK              ACCOUNT NO.:  [id number]   MEDICAL RECORD NO.:  [id number]          PATIENT TYPE:  INP   LOCATION:  4734                         FACILITY:  [facility]   PHYSICIAN:  Richard A. Alanda Amass, M.D.DATE OF BIRTH:  Feb 18, 1932   DATE OF ADMISSION:  01/13/2004  DATE OF DISCHARGE:  01/18/2004                                 DISCHARGE SUMMARY   ADDENDUM:  Ms. Strohmeyer labs at discharge reveal an INR of 2.0, sodium 141,  potassium 3.9, BUN 12, creatinine 1.1.  White count 9.0, hemoglobin 11.5,  hematocrit 33.3, platelets 225.  Liver functions were normal.  Hemoglobin  A1C 7.2. CK-MB and troponin negative x 3, and BNP was 89.  Urinalysis was  unremarkable.   CT scan f the chest without contrast showed mediastinal and bilateral hilar  prominence which may need to be rechecked in three to six months.  There was  also cardiomegaly with mild interstitial edema and bilateral effusions.   EKG on admission shows sinus rhythm with left bundle branch block.       LKK/MEDQ  D:  01/18/2004  T:  01/19/2004  Job:  [id number]

## 2010-07-25 NOTE — Consult Note (Signed)
NAME:  [name], [name]                        ACCOUNT NO.:  [id number]   MEDICAL RECORD NO.:  [id number]                   PATIENT TYPE:  INP   LOCATION:  4703                                 FACILITY:  [facility]   PHYSICIAN:  Shan Levans, M.D. [facility]            DATE OF BIRTH:  1931-09-08   DATE OF CONSULTATION:  10/24/2003  DATE OF DISCHARGE:                                   CONSULTATION   CHIEF COMPLAINT:  Shortness of breath.   HISTORY OF PRESENT ILLNESS:  This is a 77 year old white female with  previous history of non-obstructive coronary artery disease with associated  chronic atrial fibrillation, history of normal left ventricular function.  She has been noted to have sick sinus syndrome and paroxysmal atrial  fibrillation in October 2004.  At that time cardiac catheterization showed  non-obstructing coronary artery disease and an EF of 50%.  She then was  given treatment for the atrial fibrillation.  At a followup visit in May of  2005, still found to be in atrial fibrillation, had medications adjusted,  became weaker about a month ago, had further adjustment of medications, but  still had persistent difficulty. She was scheduled for elective  cardioversion on October 25, 2003 but last week, while at the beach, had  progressive increased dyspnea, increased chest congestion and tightness,  increased swelling, fluid retention, took excess albuterol with some  improvement now, but then became more difficult with breathing, and was  admitted on October 23, 2003.  X-ray shows pulmonary edema.   PAST MEDICAL HISTORY:  1. History of paroxysmal atrial fibrillation with chronic Coumadin therapy.  2. History of type 2 diabetes.  3. COPD.  4. Active smoking.  5. Moderate pulmonary hypertension.  6. Peripheral hypertension.  7. Hyperlipidemia.  8. Peripheral vascular disease.  9. Previous several PCAs to the right superficial femoral artery with     stenting.  10.      Renal artery  restenosis.   REVIEW OF SYMPTOMS:  Previous CVA possibly five years ago.   ALLERGIES:  None.   MEDICATIONS PRIOR TO ADMISSION:  Coumadin, digoxin, flecainide, Toprol-XL,  Cardizem, vitamin C, Fosamax, folic acid, Prevacid, calcium, vitamin D,  Wellbutrin, omega fish oil, glucosamine.   PHYSICAL EXAMINATION:  GENERAL:  This is an elderly white female in no acute  distress.  VITAL SIGNS:  Temperature 98, blood pressure 120/40, pulse 50s, oxygen  saturation at 94%.  CHEST:  Expired wheezes with distant breath sounds, prolonged expiratory  phase.  CARDIAC:  Regular rate and rhythm.  S3.  Normal S1 and S2.  Holosystolic  murmur without S3 or S4.  ABDOMEN: Soft, nontender.  Bowel sounds active.  EXTREMITIES:  Trace to 2+ edema.   LABORATORY DATA:  Hemoglobin 11.6, white count 11.1.  Sodium 141, potassium  4.2, chloride 106, BUN 14, blood sugar 160, creatinine 1.1, digoxin 0.7.  BNP 224.  Cardiac enzymes negative.  A 2-D echo  performed today showed  moderate mitral regurgitation.  Ejection fraction 55-65%.  No comment is  made of diastolic dysfunction.  There is normal left ventricular systolic  function seen.  Wall thickness was normal.  No regional wall motion  abnormalities.  Mitral valve was normal but there was moderate mitral  regurgitation.  Right ventricular wall size was normal.  No comment made on  the degree of pulmonary hypertension present.  Chest x-ray shows mild  interstitial edema, cardiomegaly.   IMPRESSION:  Chronic dyspnea in part on the basis of congestive failure with  likely diastolic dysfunction or hypertensive heart disease, and also chronic  atrial fibrillation with resultant lack of atrial rhythm resulting in  exacerbation of cardiac function.  But also advanced chronic obstructive  lung disease with asthmatic bronchitis with resultant pulmonary hypertension  on the basis of chronic lung disease, rule out pulmonary embolism but also  evaluate for moderate  mitral regurgitation contributing to the degree of  pulmonary hypertension on the post capillary basis.  Also active smoking  aggravating lower airway inflammation.   RECOMMENDATIONS:  Given IV Solu-Medrol.  Begin Advair 250/50 one spray  b.i.d., Spiriva one capsule daily.  Obtain full set of pulmonary function  studies.  Obtain room air blood gas and obtain a spiral CT scan of the  chest.  Rule out pulmonary embolism.                                               Shan Levans, M.D. [facility]    PW/MEDQ  D:  10/24/2003  T:  10/25/2003  Job:  [id number]

## 2010-07-25 NOTE — Procedures (Signed)
[name], LISTER NO.:  [id number]   MEDICAL RECORD NO.:  [id number]          PATIENT TYPE:  OUT   LOCATION:  SLEEP CENTER                 FACILITY:  [facility]   PHYSICIAN:  Marcelyn Bruins, M.D. [facility] DATE OF BIRTH:  08-09-1931   DATE OF STUDY:  03/18/2004                              NOCTURNAL POLYSOMNOGRAM   REFERRING PHYSICIAN:  Dr. Shan Levans.   INDICATION FOR STUDY:  Hypersomnia with sleep apnea.   The patient returns for pressure optimization for CPAP titration.   SLEEP ARCHITECTURE:  The patient had a total sleep time of 344 minutes with  adequate slow wave sleep but decreased REM. Sleep efficiency was 89%. Sleep  onset latency was very rapid at two minutes, and REM latency was mildly  prolonged.   IMPRESSION:  1.  Good control of obstructive sleep apnea with 10 cm of CPAP. The      patient's Swift mask was changed to an Ultra Mirage full face mask due      to mouth-opening and the patient's desire not to wear a chin strap. Good      control with snoring was noted on this pressure as well.  2.  Paced rhythm at times observed throughout the study with no clinically      significant cardiac arrhythmias.  3.  Very large numbers of leg jerks with significant sleep disruption.      Clinical correlation is suggested.      KC/MEDQ  D:  03/24/2004 16:33:07  T:  03/24/2004 16:56:27  Job:  [id number]

## 2010-07-25 NOTE — Cardiovascular Report (Signed)
NAME:  [name], [name]                        ACCOUNT NO.:  [id number]   MEDICAL RECORD NO.:  [id number]                   PATIENT TYPE:  INP   LOCATION:  4732                                 FACILITY:  [facility]   PHYSICIAN:  Darlin Priestly, M.D.             DATE OF BIRTH:  02-Jun-1931   DATE OF PROCEDURE:  12/20/2002  DATE OF DISCHARGE:  12/20/2002                              CARDIAC CATHETERIZATION   PROCEDURES:  1. Left heart catheterization.  2. Coronary angiography.  3. Left ventriculogram.  4. Distal aortogram.   ATTENDING PHYSICIAN:  Darlin Priestly, M.D.   COMPLICATIONS:  None.   INDICATIONS:  [name] is a 77 year old female patient of Dr. Susa [name] with a  history of paroxysmal atrial fibrillation, hypertension,  chronic obstructive pulmonary disease, history of peripheral vascular  disease status post PTA and stenting of the right iliac and SFA who is  admitted with new onset atrial fibrillation.  She was noted during this  hospitalization to have T-wave inversion consistent with ischemia as well as  chest pain.  She is now referred for cardiac catheterization to rule out  significant coronary artery disease.   DESCRIPTION OF OPERATION:  After obtaining informed written consent, the  patient was brought to the cardiac catheterization laboratory.  Right and  left groin was shaved and prepped and draped in the usual sterile fashion.  ECG monitor was established.  Using the modified Seldinger technique, a 6  French arterial sheath was inserted in the left femoral artery. A 6 French  diagnostic catheter was then used to performed diagnostic angiography.  This  reveals a large left main with no significant disease.  The LAD is a large  vessel that coursed to the apex and gives rise to four diagonal branches.  The LAD has mild 40% narrowing in the early mid portion.  First, second,  third and fourth diagonals are all small vessels with no significant  disease.   Left circumflex is a medium size vessel which gives rise to two obtuse  marginal branches.  There is 30% proximal AV circumflex narrowing.  The  first OM is a medium size vessel with no significant disease.  The second OM  is a large vessel which bifurcates in the mid segment and has no significant  disease.   The right coronary is a large vessel which is dominant.  It gives rise to a  PDA as well as posterior lateral branch.  There is diffuse irregularities  throughout the proximal, mid and distal RCA with up to 30% stenosis.  The  PDA is a medium size vessel with no significant disease.  The PLA is a large  vessel which bifurcates and has no significant disease.   LEFT VENTRICULOGRAM:  Left ventriculogram reveals preserved EF of 50%.   DISTAL ABDOMINAL AORTOGRAM:  Reveals widely patent right iliac and SFA  stents.   HEMODYNAMICS:  Systemic arterial  pressure 147/60, LV pressure 145/11, LVEDP  of 23.   Following conclusion of the case, the left femoral site was then closed  using Perclose device without complications.  Hemostasis was confirmed.  1 g  of Ancef was given prophylactic.   CONCLUSIONS:  1. No significant coronary artery disease.  2. Normal left ventricular systolic function.  3. Widely patent right iliac and superficial femoral artery stents.  4. Elevated LVEDP.  5. Successful closure of the left femoral site using a Perclose device with     1 g of Ancef given prophylactically.                                                  Darlin Priestly, M.D.    RHM/MEDQ  D:  12/20/2002  T:  12/21/2002  Job:  [id number]   cc:   Gerlene Burdock A. Alanda Amass, M.D.  [phone]  [place]  [place] [address]  Fax: [phone]   Armanda Magic, M.D.  [address]  [place], [place] [address]  Fax: [phone]

## 2010-07-28 NOTE — Telephone Encounter (Signed)
LMTC

## 2010-07-30 NOTE — Telephone Encounter (Signed)
I have heard no response from the patient so I will close this encounter.

## 2010-08-08 ENCOUNTER — Ambulatory Visit (INDEPENDENT_AMBULATORY_CARE_PROVIDER_SITE_OTHER): Payer: Medicare Other | Admitting: *Deleted

## 2010-08-08 DIAGNOSIS — I6350 Cerebral infarction due to unspecified occlusion or stenosis of unspecified cerebral artery: Secondary | ICD-10-CM

## 2010-08-08 DIAGNOSIS — I4891 Unspecified atrial fibrillation: Secondary | ICD-10-CM

## 2010-08-08 LAB — POCT INR: INR: 2.5

## 2010-08-11 ENCOUNTER — Encounter: Payer: Self-pay | Admitting: Internal Medicine

## 2010-08-11 ENCOUNTER — Other Ambulatory Visit: Payer: Self-pay | Admitting: *Deleted

## 2010-08-11 DIAGNOSIS — I495 Sick sinus syndrome: Secondary | ICD-10-CM

## 2010-08-11 MED ORDER — GABAPENTIN 100 MG PO CAPS: ORAL_CAPSULE | ORAL | Status: DC

## 2010-08-11 NOTE — [place] Notes (Signed)
Spoke w/pt, She never received rx for gabapentin. Re-sent to pharm. Pt also has physical therapy starting today. Confirmed w/PCC's - [facility] has info needed, nothing further needed from our office for this referral. Pt aware

## 2010-08-12 ENCOUNTER — Other Ambulatory Visit: Payer: Self-pay | Admitting: *Deleted

## 2010-08-12 ENCOUNTER — Encounter: Payer: Self-pay | Admitting: *Deleted

## 2010-08-12 MED ORDER — WARFARIN SODIUM 5 MG PO TABS: ORAL_TABLET | ORAL | Status: DC

## 2010-08-12 NOTE — Telephone Encounter (Signed)
This encounter was created in error - please disregard.

## 2010-08-14 ENCOUNTER — Other Ambulatory Visit: Payer: Self-pay | Admitting: *Deleted

## 2010-08-14 MED ORDER — POTASSIUM CHLORIDE CRYS ER 20 MEQ PO TBCR: EXTENDED_RELEASE_TABLET | ORAL | Status: DC

## 2010-08-27 ENCOUNTER — Telehealth: Payer: Self-pay | Admitting: *Deleted

## 2010-08-27 NOTE — Telephone Encounter (Signed)
Pt inquiring as to whether she is suppose to refill Gabapentin Rx and continue to take.? Would you please look at 06/24/10 OV.

## 2010-08-28 NOTE — Telephone Encounter (Signed)
Pt. Informed.

## 2010-08-28 NOTE — Telephone Encounter (Signed)
I see where Gabapentin was taken Dc'd on 06/24/2010 office visit. lmoam for pt to call back with this info.

## 2010-09-02 ENCOUNTER — Ambulatory Visit (INDEPENDENT_AMBULATORY_CARE_PROVIDER_SITE_OTHER): Payer: Medicare Other | Admitting: Cardiovascular Disease

## 2010-09-02 ENCOUNTER — Encounter: Payer: Self-pay | Admitting: Cardiovascular Disease

## 2010-09-02 ENCOUNTER — Ambulatory Visit (INDEPENDENT_AMBULATORY_CARE_PROVIDER_SITE_OTHER): Payer: Medicare Other | Admitting: *Deleted

## 2010-09-02 VITALS — BP 110/64 | HR 70 | Ht 65.0 in | Wt 175.0 lb

## 2010-09-02 DIAGNOSIS — I739 Peripheral vascular disease, unspecified: Secondary | ICD-10-CM

## 2010-09-02 DIAGNOSIS — I6350 Cerebral infarction due to unspecified occlusion or stenosis of unspecified cerebral artery: Secondary | ICD-10-CM

## 2010-09-02 DIAGNOSIS — I4891 Unspecified atrial fibrillation: Secondary | ICD-10-CM

## 2010-09-02 DIAGNOSIS — I70219 Atherosclerosis of native arteries of extremities with intermittent claudication, unspecified extremity: Secondary | ICD-10-CM

## 2010-09-02 LAB — POCT INR: INR: 2.5

## 2010-09-02 NOTE — Progress Notes (Signed)
HPI:  This is a 77 year old woman presenting for followup of lower extremity peripheral arterial disease.  Patient has undergone multiple lower extremity interventional procedures, including bilateral tibial angioplasty procedures and extensive right iliac and SFA stenting. 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 Stents were placed in the external and common iliac as well as the common femoral and superficial femoral arteries. The patient previously undergone tibial interventions before this procedure. She has done remarkably well over the last 10 years. However, more recently she has developed increasing right leg pain. She has knee and hip pain as well as right calf pain. She has mild left leg symptoms. She denies rest pain or ischemic ulceration. Her walking is limited from leg pain. The patient continues to smoke cigarettes and has not been particularly interested in quitting. She has received extensive tobacco cessation counseling from all of her physicians.  Outpatient Encounter Prescriptions as of 09/02/2010  Medication Sig Dispense Refill  . aspirin 81 MG EC tablet Take 81 mg by mouth daily.        Marland Kitchen atorvastatin (LIPITOR) 20 MG tablet Take 20 mg by mouth daily.        . cilostazol (PLETAL) 100 MG tablet Take 100 mg by mouth 2 (two) times daily.        . Ferrous Sulfate (FE-CAPS) 250 MG CPCR Take 1 capsule by mouth daily.        . fish oil-omega-3 fatty acids 1000 MG capsule Take 1 g by mouth 2 (two) times daily.        . Fluticasone-Salmeterol (ADVAIR DISKUS) 250-50 MCG/DOSE AEPB Inhale 1 puff into the lungs 2 (two) times daily.        . furosemide (LASIX) 40 MG tablet Take 80 mg by mouth 2 (two) times daily.        Marland Kitchen gabapentin (NEURONTIN) 100 MG capsule Take one tablet daily x 3, 1 tablets AM, PM x 3 days; 1 talbet 3 times a day x 3 days; 2 tablets 3 times a day.  80 capsule   1  . glipiZIDE (GLUCOTROL) 5 MG tablet Take 7.5 mg by mouth daily.        . Glucosamine-Chondroit-Vit C-Mn (GLUCOSAMINE 1500 COMPLEX) CAPS Take 1 capsule by mouth 2 (two) times daily.        Marland Kitchen guaiFENesin (MUCINEX) 600 MG 12 hr tablet Take 600 mg by mouth 2 (two) times daily.        Marland Kitchen letrozole (FEMARA) 2.5 MG tablet Take 1 tablet by mouth Daily.      Marland Kitchen levalbuterol (XOPENEX HFA) 45 MCG/ACT inhaler Inhale 2 puffs into the lungs every 6 (six) hours as needed.        . Magnesium 250 MG TABS Take 1 tablet by mouth daily.        . mometasone (NASONEX) 50 MCG/ACT nasal spray 2 sprays by Nasal route daily.  51 g  4  . potassium chloride SA (K-DUR,KLOR-CON) 20 MEQ tablet Take 2 tabs every am and 3 tabs every pm  450 tablet  3  . tiotropium (SPIRIVA HANDIHALER) 18 MCG inhalation capsule Place 18 mcg into inhaler and inhale daily.        Marland Kitchen warfarin (COUMADIN) 5 MG tablet Take as directed by Anticoagulation clinic   135 tablet  3    Allergies  Allergen Reactions  . Seasonal  RAGWEED SEASON    Past Medical History  Diagnosis Date  . CVA (cerebral vascular accident)   . Sleep apnea     associated with hypersomnia  . Amiodarone pulmonary toxicity   . Renal disease     CHRONIC  . Diabetes   . Tobacco abuse   . Diastolic heart failure     Acute on Chronic  . Pacemaker     Permanent  . AF (atrial fibrillation)      AV ablation 9/09 [facility] per Dr Sampson Goon - AV node ablation 9/11 Dr Graciela Husbands  . Hyperlipidemia   . GERD (gastroesophageal reflux disease)   . [name] (coronary artery disease)     (not sure of this 11/10  . COPD (chronic obstructive pulmonary disease)     emphysema -FeV1 73% DLCO 53% 5/09  . PAD (peripheral artery disease)     w/hx right iliac/SFA stenting and left leg PTA  . Invasive ductal carcinoma of breast 2011    LEFT   . OA (osteoarthritis) of knee     RIGHT  . Right sided sciatica     ROS: Negative except as per HPI  BP 110/64  Pulse 70  Ht 5\' 5"  (1.651 m)  Wt  175 lb (79.379 kg)  BMI 29.12 kg/m2  PHYSICAL EXAM: Pt is alert and oriented, NAD HEENT: normal Neck: JVP - normal, carotids 2+= without bruits Lungs: CTA bilaterally CV: RRR without murmur or gallop Abd: soft, NT, Positive BS, no hepatomegaly Ext: no C/C/E, femoral pulses are 2+ and equal bilaterally, DP and PT pulses are diminished bilaterally. Skin: warm/dry no rash  EKG:  Electronic ventricular pacemaker 70 beats per minute  ASSESSMENT AND PLAN:

## 2010-09-02 NOTE — Assessment & Plan Note (Signed)
The patient has an abnormal pulse exam. She has bilateral leg pain right worse than left with both typical and atypical features for claudication. I've recommended repeating an arterial duplex scan and ABIs. She will continue on her current medical program.  Will followup after her vascular imaging studies are completed.

## 2010-09-02 NOTE — Patient Instructions (Signed)
Your physician has requested that you have a lower  extremity arterial duplex. This test is an ultrasound of the arteries in the legs or arms. It looks at arterial blood flow in the legs and arms. Allow one hour for Lower and Upper Arterial scans. There are no restrictions or special instructions  Your physician wants you to follow-up in: 12 months with Dr. Excell Seltzer. You will receive a reminder letter in the mail two months in advance. If you don't receive a letter, please call our office to schedule the follow-up appointment.

## 2010-09-03 ENCOUNTER — Encounter: Payer: Medicare Other | Admitting: *Deleted

## 2010-09-12 ENCOUNTER — Telehealth: Payer: Self-pay | Admitting: *Deleted

## 2010-09-12 NOTE — Telephone Encounter (Signed)
OK for aquatic therapy

## 2010-09-12 NOTE — Telephone Encounter (Signed)
Returned call to PT//LMOVM giving a verbal approval

## 2010-09-12 NOTE — Telephone Encounter (Signed)
Therapist is req for pt to have aquatic therapy. If ok, we need to call and give verbal.

## 2010-09-18 ENCOUNTER — Encounter (INDEPENDENT_AMBULATORY_CARE_PROVIDER_SITE_OTHER): Payer: Medicare Other | Admitting: Cardiology

## 2010-09-18 DIAGNOSIS — I70219 Atherosclerosis of native arteries of extremities with intermittent claudication, unspecified extremity: Secondary | ICD-10-CM

## 2010-09-18 DIAGNOSIS — I739 Peripheral vascular disease, unspecified: Secondary | ICD-10-CM

## 2010-09-22 ENCOUNTER — Encounter: Payer: Self-pay | Admitting: Cardiovascular Disease

## 2010-09-30 ENCOUNTER — Ambulatory Visit (INDEPENDENT_AMBULATORY_CARE_PROVIDER_SITE_OTHER): Payer: Medicare Other | Admitting: *Deleted

## 2010-09-30 DIAGNOSIS — I6350 Cerebral infarction due to unspecified occlusion or stenosis of unspecified cerebral artery: Secondary | ICD-10-CM

## 2010-09-30 DIAGNOSIS — I4891 Unspecified atrial fibrillation: Secondary | ICD-10-CM

## 2010-10-07 ENCOUNTER — Ambulatory Visit: Payer: Medicare Other | Admitting: Critical Care Medicine

## 2010-10-07 ENCOUNTER — Ambulatory Visit (INDEPENDENT_AMBULATORY_CARE_PROVIDER_SITE_OTHER): Payer: Medicare Other | Admitting: Critical Care Medicine

## 2010-10-07 ENCOUNTER — Other Ambulatory Visit: Payer: Self-pay | Admitting: Critical Care Medicine

## 2010-10-07 ENCOUNTER — Encounter: Payer: Self-pay | Admitting: Critical Care Medicine

## 2010-10-07 VITALS — BP 100/60 | HR 71 | Temp 98.1°F | Ht 65.0 in | Wt 175.6 lb

## 2010-10-07 DIAGNOSIS — J449 Chronic obstructive pulmonary disease, unspecified: Secondary | ICD-10-CM

## 2010-10-07 MED ORDER — TIOTROPIUM BROMIDE MONOHYDRATE 18 MCG IN CAPS: 18.0000 ug | ORAL_CAPSULE | Freq: Every day | RESPIRATORY_TRACT | Status: DC

## 2010-10-07 NOTE — Patient Instructions (Signed)
No change in medications. Return in          4 months Remember to get a Flu Vaccine this fall.

## 2010-10-07 NOTE — Progress Notes (Signed)
Subjective:    Patient ID: [name], female    DOB: November 14, 1931, 77 y.o.   MRN: [id number]  HPI  77 y.o.   white female with history of chronic obstructive lung disease, asthmatic bronchitis, and obstructive sleep apnea. Active smoker and has failed smoking cessation on multiple attempts.   January 13, 2010 12:14 PM  Feels better now. Never coughed up much. No chest pain. Dyspnea is better vs 10/25.  Not inhaling as much cigarettes.  Pt denies any significant sore throat, nasal congestion or excess secretions, fever, chills, sweats, unintended weight loss, pleurtic or exertional chest pain, orthopnea PND, or leg swelling  Pt denies any increase in rescue therapy over baseline, denies waking up needing it or having any early am or nocturnal exacerbations of coughing/wheezing/or dyspnea.   06/20/10 Copd f/u.   Since last ov,  Still has dyspnea on exertion,  Moves slowly,  No energy.  Still has cough,  Uses mucinex.  Mucus now is clear.   No chest pain.  Ablations failed for Afib. Has PPM.  Adjusted the PPM backup rate.  F/u with ONC.  No XRT or crx for breast ca.  Nodules on CT chest being followed    7/31 Notes more congestion.  Doubled mucinex, this helps.  Mucus now clear to beige.  No fever.  Level dyspnea same. Past Medical History  Diagnosis Date  . CVA (cerebral vascular accident)   . Sleep apnea     associated with hypersomnia  . Amiodarone pulmonary toxicity   . Renal disease     CHRONIC  . Diabetes   . Tobacco abuse   . Diastolic heart failure     Acute on Chronic  . Pacemaker     Permanent  . AF (atrial fibrillation)      AV ablation 9/09 [facility] per Dr Sampson Goon - AV node ablation 9/11 Dr Graciela Husbands  . Hyperlipidemia   . GERD (gastroesophageal reflux disease)   . [name] (coronary artery disease)     (not sure of this 11/10  . COPD (chronic obstructive pulmonary disease)     emphysema -FeV1 73% DLCO 53% 5/09  . PAD (peripheral artery disease)     w/hx right  iliac/SFA stenting and left leg PTA  . Invasive ductal carcinoma of breast 2011    LEFT   . OA (osteoarthritis) of knee     RIGHT  . Right sided sciatica      Family History  Problem Relation Age of Onset  . Heart disease Father      History   Social History  . Marital Status: Married    Spouse Name: N/A    Number of Children: N/A  . Years of Education: N/A   Occupational History  . Not on file.   Social History Main Topics  . Smoking status: Current Everyday Smoker -- 1.0 packs/day    Types: Cigarettes  . Smokeless tobacco: Never Used   Comment: started smoking at age 43  . Alcohol Use: Yes     wine occasionally  . Drug Use: Not on file  . Sexually Active: Not on file   Other Topics Concern  . Not on file   Social History Narrative   HS Graduate; [employer]. [employer].  Married '61.  2 sons - '70, '71; Dtrs - '64,'68; 6 grandchildren.  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 Interests -Tai-chi & water aerobics, gardening, active lifestyle.  Marriage a bit stressful - SO w/membory problems and difficult behavior. She denies any personal safety concerns. End of life care: need to address at next OV      Allergies  Allergen Reactions  . Seasonal     RAGWEED SEASON     Outpatient Prescriptions Prior to Visit  Medication Sig Dispense Refill  . aspirin 81 MG EC tablet Take 81 mg by mouth daily.        Marland Kitchen atorvastatin (LIPITOR) 20 MG tablet Take 20 mg by mouth daily.        . cilostazol (PLETAL) 100 MG tablet Take 100 mg by mouth 2 (two) times daily.        . Ferrous Sulfate (FE-CAPS) 250 MG CPCR Take 1 capsule by mouth daily.        . fish oil-omega-3 fatty acids 1000 MG capsule Take 1 g by mouth 2 (two) times daily.        . Fluticasone-Salmeterol (ADVAIR DISKUS) 250-50 MCG/DOSE AEPB Inhale 1 puff into the lungs 2  (two) times daily.        . furosemide (LASIX) 40 MG tablet Take 80 mg by mouth 2 (two) times daily.        Marland Kitchen glipiZIDE (GLUCOTROL) 5 MG tablet Take 7.5 mg by mouth daily.        . Glucosamine-Chondroit-Vit C-Mn (GLUCOSAMINE 1500 COMPLEX) CAPS Take 1 capsule by mouth 2 (two) times daily.        Marland Kitchen guaiFENesin (MUCINEX) 600 MG 12 hr tablet Take 1,200 mg by mouth 2 (two) times daily.       Marland Kitchen letrozole (FEMARA) 2.5 MG tablet Take 1 tablet by mouth Daily.      Marland Kitchen levalbuterol (XOPENEX HFA) 45 MCG/ACT inhaler Inhale 2 puffs into the lungs every 6 (six) hours as needed.        . Magnesium 250 MG TABS Take 1 tablet by mouth daily.        . mometasone (NASONEX) 50 MCG/ACT nasal spray 2 sprays by Nasal route daily.  51 g  4  . potassium chloride SA (K-DUR,KLOR-CON) 20 MEQ tablet Take 2 tabs every am and 3 tabs every pm  450 tablet  3  . warfarin (COUMADIN) 5 MG tablet Take as directed by Anticoagulation clinic   135 tablet  3  . tiotropium (SPIRIVA HANDIHALER) 18 MCG inhalation capsule Place 18 mcg into inhaler and inhale daily.        Marland Kitchen gabapentin (NEURONTIN) 100 MG capsule Take one tablet daily x 3, 1 tablets AM, PM x 3 days; 1 talbet 3 times a day x 3 days; 2 tablets 3 times a day.  80 capsule  1     Review of Systems  Constitutional:   No  weight loss, night sweats,  Fevers, chills, fatigue, lassitude. HEENT:   No headaches,  Difficulty swallowing,  Tooth/dental problems,  Sore throat,                No sneezing, itching, ear ache, nasal congestion, post nasal drip,   CV:  No chest pain,  Orthopnea, PND, swelling in lower extremities, anasarca, dizziness, palpitations  GI  No heartburn, indigestion, abdominal pain, nausea, vomiting, diarrhea, change in bowel habits, loss of appetite  Resp: Notes  shortness of breath with exertion not  at rest.  No excess mucus, notes  productive cough,  No non-productive cough,  No coughing up of blood.  No  change in color of mucus.  No wheezing.  No chest wall  deformity  Skin: no rash or lesions.  GU: no dysuria, change in color of urine, no urgency or frequency.  No flank pain.  MS:  No joint pain or swelling.  No decreased range of motion.  No back pain.  Psych:  No change in mood or affect. No depression or anxiety.  No memory loss.     Objective:   Physical Exam  Gen: Pleasant, well-nourished, in no distress,  normal affect  ENT: No lesions,  mouth clear,  oropharynx clear, no postnasal drip  Neck: No JVD, no TMG, no carotid bruits  Lungs: No use of accessory muscles, no dullness to percussion, distant bs, clear Cardiovascular: RRR, heart sounds normal, no murmur or gallops, no peripheral edema  Abdomen: soft and NT, no HSM,  BS normal  Musculoskeletal: No deformities, no cyanosis or clubbing  Neuro: alert, non focal  Skin: Warm, no lesions or rashes        Assessment & Plan:   COPD Ongoing tobacco abuse Copd otherwise stable Plan No change in inhaled or maintenance medications.        Updated Medication List Outpatient Encounter Prescriptions as of 10/07/2010  Medication Sig Dispense Refill  . aspirin 81 MG EC tablet Take 81 mg by mouth daily.        Marland Kitchen atorvastatin (LIPITOR) 20 MG tablet Take 20 mg by mouth daily.        . cilostazol (PLETAL) 100 MG tablet Take 100 mg by mouth 2 (two) times daily.        . Ferrous Sulfate (FE-CAPS) 250 MG CPCR Take 1 capsule by mouth daily.        . fish oil-omega-3 fatty acids 1000 MG capsule Take 1 g by mouth 2 (two) times daily.        . Fluticasone-Salmeterol (ADVAIR DISKUS) 250-50 MCG/DOSE AEPB Inhale 1 puff into the lungs 2 (two) times daily.        . furosemide (LASIX) 40 MG tablet Take 80 mg by mouth 2 (two) times daily.        Marland Kitchen glipiZIDE (GLUCOTROL) 5 MG tablet Take 7.5 mg by mouth daily.        . Glucosamine-Chondroit-Vit C-Mn (GLUCOSAMINE 1500 COMPLEX) CAPS Take 1 capsule by mouth 2 (two) times daily.        Marland Kitchen guaiFENesin (MUCINEX) 600 MG 12 hr tablet Take 1,200  mg by mouth 2 (two) times daily.       Marland Kitchen letrozole (FEMARA) 2.5 MG tablet Take 1 tablet by mouth Daily.      Marland Kitchen levalbuterol (XOPENEX HFA) 45 MCG/ACT inhaler Inhale 2 puffs into the lungs every 6 (six) hours as needed.        . Magnesium 250 MG TABS Take 1 tablet by mouth daily.        . mometasone (NASONEX) 50 MCG/ACT nasal spray 2 sprays by Nasal route daily.  51 g  4  . NIACINAMIDE PO Take by mouth 3 (three) times daily.        . potassium chloride SA (K-DUR,KLOR-CON) 20 MEQ tablet Take 2 tabs every am and 3 tabs every pm  450 tablet  3  . tiotropium (SPIRIVA HANDIHALER) 18 MCG inhalation capsule Place 1 capsule (18 mcg total) into inhaler and inhale daily.  90 capsule  4  . vitamin E 100 UNIT capsule Twice weekly       . warfarin (COUMADIN) 5 MG tablet  Take as directed by Anticoagulation clinic   135 tablet  3  . DISCONTD: tiotropium (SPIRIVA HANDIHALER) 18 MCG inhalation capsule Place 18 mcg into inhaler and inhale daily.        Marland Kitchen DISCONTD: gabapentin (NEURONTIN) 100 MG capsule Take one tablet daily x 3, 1 tablets AM, PM x 3 days; 1 talbet 3 times a day x 3 days; 2 tablets 3 times a day.  80 capsule  1

## 2010-10-08 NOTE — Assessment & Plan Note (Signed)
Ongoing tobacco abuse Copd otherwise stable Plan No change in inhaled or maintenance medications.

## 2010-10-09 ENCOUNTER — Telehealth: Payer: Self-pay | Admitting: *Deleted

## 2010-10-09 ENCOUNTER — Encounter: Payer: Self-pay | Admitting: Internal Medicine

## 2010-10-09 ENCOUNTER — Ambulatory Visit (INDEPENDENT_AMBULATORY_CARE_PROVIDER_SITE_OTHER): Payer: Medicare Other | Admitting: Internal Medicine

## 2010-10-09 ENCOUNTER — Other Ambulatory Visit: Payer: Self-pay | Admitting: Internal Medicine

## 2010-10-09 ENCOUNTER — Ambulatory Visit (INDEPENDENT_AMBULATORY_CARE_PROVIDER_SITE_OTHER)
Admission: RE | Admit: 2010-10-09 | Discharge: 2010-10-09 | Disposition: A | Discharge status: Home / Self Care | Payer: Medicare Other | Source: Ambulatory Visit | Attending: Internal Medicine | Admitting: Internal Medicine

## 2010-10-09 DIAGNOSIS — R042 Hemoptysis: Secondary | ICD-10-CM

## 2010-10-09 DIAGNOSIS — I4891 Unspecified atrial fibrillation: Secondary | ICD-10-CM

## 2010-10-09 DIAGNOSIS — E119 Type 2 diabetes mellitus without complications: Secondary | ICD-10-CM

## 2010-10-09 DIAGNOSIS — E785 Hyperlipidemia, unspecified: Secondary | ICD-10-CM

## 2010-10-09 NOTE — Progress Notes (Signed)
Subjective:    Patient ID: [name], female    DOB: 25-Sep-1931, 77 y.o.   MRN: [id number]  HPI [name] is asked to come in for evaluation due to a report of hemoptysis. She is followed by Dr. Delford Field and she saw him Tuesday and was stable re: COPD. She had no other symptoms to suggest URI. She had an INR last week that was 3.1. She has had some spontaneous brusing on the back at the flank without known injury. She has had no change in respiratory status.  She is asking to change from glipizide to another antiglycemic. No recent A1C.  She has tried water aerobics but has not returned. She has had two steroid injections to the knee. She has started hyaluronidase injections.  Past Medical History  Diagnosis Date  . CVA (cerebral vascular accident)   . Sleep apnea     associated with hypersomnia  . Amiodarone pulmonary toxicity   . Renal disease     CHRONIC  . Diabetes   . Tobacco abuse   . Diastolic heart failure     Acute on Chronic  . Pacemaker     Permanent  . AF (atrial fibrillation)      AV ablation 9/09 [facility] per Dr Sampson Goon - AV node ablation 9/11 Dr Graciela Husbands  . Hyperlipidemia   . GERD (gastroesophageal reflux disease)   . [name] (coronary artery disease)     (not sure of this 11/10  . COPD (chronic obstructive pulmonary disease)     emphysema -FeV1 73% DLCO 53% 5/09  . PAD (peripheral artery disease)     w/hx right iliac/SFA stenting and left leg PTA  . Invasive ductal carcinoma of breast 2011    LEFT   . OA (osteoarthritis) of knee     RIGHT  . Right sided sciatica    Past Surgical History  Procedure Date  . Mastectomy, partial 02/03/2010    Left/Dr Rosenbower  . Cataract extraction, bilateral     with IOL/Dr Groat  . Breast lumpectomy     left breast   Family History  Problem Relation Age of Onset  . Heart disease Father    History   Social History  . Marital Status: Married    Spouse Name: N/A    Number of Children: N/A  . Years of  Education: N/A   Occupational History  . Not on file.   Social History Main Topics  . Smoking status: Current Everyday Smoker -- 1.0 packs/day    Types: Cigarettes  . Smokeless tobacco: Never Used   Comment: started smoking at age 49  . Alcohol Use: Yes     wine occasionally  . Drug Use: Not on file  . Sexually Active: Not on file   Other Topics Concern  . Not on file   Social History Narrative   HS Graduate; [employer]. [employer].  Married '61.  2 sons - '70, '71; Dtrs - '64,'68; 6 grandchildren.  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 Interests -Tai-chi & water aerobics, gardening, active lifestyle.  Marriage a bit stressful - SO w/membory problems and difficult behavior. She denies any personal safety concerns. End of life care: need to address at next OV        Review of Systems System review is negative for any constitutional, cardiac, pulmonary, GI or neuro symptoms or complaints  Objective:   Physical Exam Vitals noted Gen'l - elderly white woman in no acute distress Pul - no increased work of breathing, no rales or wheeze Cor - RRR, 2+ radial pulse        Assessment & Plan:  Hemoptysis - patient with several episodes of hemoptysis over the past several days. Exam is normal. PA & Lateral CXR is without mass, infiltrate, evidence of acute bronchitis. She has had no weight change night sweats or other paraneoplastic symptoms or signs of infection.  Please - for continued hemoptysis she will need to see Dr. Delford Field.

## 2010-10-09 NOTE — Telephone Encounter (Signed)
Pt c/o cough w/blood in sputum after working outside in her garden for [2] hours yesterday. Pt states she felt weak when she went inside to take shower [felt that it was from being in the heat outside].  Pt c/o of bruising on back w/o injury and/or accident. First noticed [1] month ago around bra-line and thought to be coming from the band rubbing back.  Pt states next bruising noticed [2] weeks ago. [facility]. Pt told to eat greens for Vitamin K & split dosage in half for one day and then go back to regular 5 mg dosage thereafter. Pt had f/u with Dr Delford Field on 10/07/10 & was asked if "Pradaxa had been suggested to her by any MD?" when assessing back bruising. Pt c/o new bruising on back noticed yesterday. Pt states she is not having any pain. Pt states that she is unsure of which MD she needs to see for assessment. Scheduled patient for OV today at 04:15pm

## 2010-10-09 NOTE — Telephone Encounter (Signed)
k

## 2010-10-10 ENCOUNTER — Other Ambulatory Visit (INDEPENDENT_AMBULATORY_CARE_PROVIDER_SITE_OTHER): Payer: Medicare Other

## 2010-10-10 DIAGNOSIS — I4891 Unspecified atrial fibrillation: Secondary | ICD-10-CM

## 2010-10-10 DIAGNOSIS — Z79899 Other long term (current) drug therapy: Secondary | ICD-10-CM

## 2010-10-10 DIAGNOSIS — R042 Hemoptysis: Secondary | ICD-10-CM

## 2010-10-10 DIAGNOSIS — E785 Hyperlipidemia, unspecified: Secondary | ICD-10-CM

## 2010-10-10 DIAGNOSIS — E119 Type 2 diabetes mellitus without complications: Secondary | ICD-10-CM

## 2010-10-10 LAB — COMPREHENSIVE METABOLIC PANEL
Albumin: 3.9 g/dL (ref 3.5–5.2)
BUN: 16 mg/dL (ref 6–23)
CO2: 29 mEq/L (ref 19–32)
Calcium: 9.1 mg/dL (ref 8.4–10.5)
Chloride: 107 mEq/L (ref 96–112)
Creatinine, Ser: 1 mg/dL (ref 0.4–1.2)
GFR: 57.1 mL/min — ABNORMAL LOW (ref >60.00)
Glucose, Bld: 117 mg/dL — ABNORMAL HIGH (ref 70–99)
Potassium: 4.2 mEq/L (ref 3.5–5.1)

## 2010-10-10 LAB — HEPATIC FUNCTION PANEL
ALT: 21 U/L (ref 0–35)
Albumin: 3.9 g/dL (ref 3.5–5.2)
Total Protein: 6.9 g/dL (ref 6.0–8.3)

## 2010-10-10 LAB — LIPID PANEL
Cholesterol: 129 mg/dL (ref 0–200)
Triglycerides: 95 mg/dL (ref 0.0–149.0)

## 2010-10-10 LAB — CBC WITH DIFFERENTIAL/PLATELET
Basophils Absolute: 0.1 10*3/uL (ref 0.0–0.1)
Eosinophils Absolute: 0.2 10*3/uL (ref 0.0–0.7)
Eosinophils Relative: 2.2 % (ref 0.0–5.0)
MCHC: 33.3 g/dL (ref 30.0–36.0)
MCV: 91.4 fl (ref 78.0–100.0)
Monocytes Absolute: 0.7 10*3/uL (ref 0.1–1.0)
Neutrophils Relative %: 59.7 % (ref 43.0–77.0)
Platelets: 169 10*3/uL (ref 150.0–400.0)
RDW: 16.2 % — ABNORMAL HIGH (ref 11.5–14.6)
WBC: 8.2 10*3/uL (ref 4.5–10.5)

## 2010-10-10 LAB — VITAMIN B12: Vitamin B-12: 616 pg/mL (ref 211–911)

## 2010-10-10 LAB — PROTIME-INR: Prothrombin Time: 23.1 s — ABNORMAL HIGH (ref 10.2–12.4)

## 2010-10-13 ENCOUNTER — Encounter: Payer: Self-pay | Admitting: Internal Medicine

## 2010-10-27 ENCOUNTER — Encounter: Payer: Self-pay | Admitting: Internal Medicine

## 2010-10-27 ENCOUNTER — Ambulatory Visit (INDEPENDENT_AMBULATORY_CARE_PROVIDER_SITE_OTHER): Payer: Medicare Other | Admitting: Internal Medicine

## 2010-10-27 DIAGNOSIS — E119 Type 2 diabetes mellitus without complications: Secondary | ICD-10-CM

## 2010-10-27 DIAGNOSIS — G250 Essential tremor: Secondary | ICD-10-CM

## 2010-10-27 DIAGNOSIS — S51809A Unspecified open wound of unspecified forearm, initial encounter: Secondary | ICD-10-CM

## 2010-10-27 MED ORDER — ATENOLOL 25 MG PO TABS: ORAL_TABLET | ORAL | Status: DC

## 2010-10-27 MED ORDER — METFORMIN HCL 500 MG PO TABS: ORAL_TABLET | ORAL | Status: DC

## 2010-10-27 NOTE — Patient Instructions (Signed)
Diabetes - ok to stop glucotrol. Start metformin for control of diabetes, there is virtually no risk of low blood sugar. Start with 1 dose a day for 5 days.  Essential tremor - will start atenolol for control at once a day and this can be advanced to twice a day if needed.  Wound care - betadine soak: just enough betadine to turn the water light brown. Soak for 5-10 minutes. Rinse then was gently with soap and water in the direction to preserve the skin flap. Rinse, dry and cover with a bandaid. Twice a day would be idea.  Lab review: these values are actually pretty good. No need to change any of your medications.   Shingles vaccine - a good idea to have this. Check for insurance coverage. OK to have it done at a participating pharmacy as long as they send notification.

## 2010-10-28 ENCOUNTER — Encounter: Payer: Medicare Other | Admitting: *Deleted

## 2010-10-29 DIAGNOSIS — G250 Essential tremor: Secondary | ICD-10-CM | POA: Insufficient documentation

## 2010-10-29 NOTE — Assessment & Plan Note (Signed)
Lab Results  Component Value Date   HGBA1C 6.9* 10/10/2010   Good control but having hypoglycemic episodes on sulfonylurea. She does have normal renal function.  Plan - stop glucotrol           Start Metformin 500 mg BID - slow start and titrate to full dose.            Routine follow-up lab

## 2010-10-29 NOTE — Progress Notes (Signed)
  Subjective:    Patient ID: [name], female    DOB: 04/04/1931, 77 y.o.   MRN: [id number]  HPI [name] presents with several issues: she wishes to review her recent lab results after receiving a letter with the same; her tremor previously diagnosed as an essential tremor is worse and interfering with basic ADLs; she reports several hypoglycemic episodes and would like to stop Glucotrol; she has a wound from a dog claw scratch which she has been covering with a duoderm like product that needs evaluation and she is interested in shingles vaccine. She is actually feeling pretty well.  Art Laural Benes, MSIII did review all of her lab results with her. There were no significant abnormalities.   PMH, FamHx and SocHx reviewed for any changes and relevance.    Review of Systems System review is negative for any constitutional, cardiac, pulmonary, GI or neuro symptoms or complaints     Objective:   Physical Exam Vitals reviewed - normal Gen'l - a heavyset older woman in no distress HEENT - C&S clear, PERRLA Chest - CTAP COR- RRR, 2+ radial pulse Neuro - high frequency tremor that does not go away with intention. No cognwheeling, no focal weakness Derm - 1.5 cm wound right forearm with small skin flap, erythematous base, no exudate, no heat to surrounding skin.       Assessment & Plan:  1. Claw wound - does not appear infected. Plan - wound care: betadine soak BID followed by gently soap and water wash, dry and cover with non-adherent bandage.  2. Health maintenance - recommended shingles vaccine if she has adequate insurance coverage.

## 2010-10-29 NOTE — Assessment & Plan Note (Signed)
Worsening tremor such that it interferes with ADLs.  Plan - selective beta-blocker - Atenolol 25mg  once a day and can advance to BID if necessary for control. If this fails will consider mysoline.

## 2010-11-03 ENCOUNTER — Encounter (INDEPENDENT_AMBULATORY_CARE_PROVIDER_SITE_OTHER): Payer: Self-pay | Admitting: General Surgery

## 2010-11-04 ENCOUNTER — Ambulatory Visit (INDEPENDENT_AMBULATORY_CARE_PROVIDER_SITE_OTHER): Payer: Self-pay | Admitting: General Surgery

## 2010-11-11 ENCOUNTER — Encounter: Payer: Self-pay | Admitting: Internal Medicine

## 2010-11-11 ENCOUNTER — Telehealth: Payer: Self-pay | Admitting: Critical Care Medicine

## 2010-11-11 DIAGNOSIS — I495 Sick sinus syndrome: Secondary | ICD-10-CM

## 2010-11-11 NOTE — Telephone Encounter (Signed)
I spoke with pt and she wants to make sure Dr. Delford Field approves her taking metformin and atenolol prescribed by Dr. Debby Bud. Pt states her Daughter Dr. Carolanne Grumbling advised her that one of the medications can cause her to have increase SOB. Pt aware Dr. Delford Field is not in the office. Please advise Dr. Delford Field.Thanks  Carver Fila, CMA

## 2010-11-11 NOTE — Telephone Encounter (Signed)
Pt aware PW states it is fine for her to take these 2 meds.

## 2010-11-11 NOTE — Telephone Encounter (Signed)
These are ok

## 2010-11-13 ENCOUNTER — Encounter: Payer: Self-pay | Admitting: *Deleted

## 2010-11-17 ENCOUNTER — Other Ambulatory Visit: Payer: Self-pay | Admitting: Internal Medicine

## 2010-11-17 DIAGNOSIS — C50919 Malignant neoplasm of unspecified site of unspecified female breast: Secondary | ICD-10-CM

## 2010-11-17 DIAGNOSIS — Z9889 Other specified postprocedural states: Secondary | ICD-10-CM

## 2010-11-20 ENCOUNTER — Encounter: Payer: Medicare Other | Admitting: *Deleted

## 2010-11-21 ENCOUNTER — Ambulatory Visit (INDEPENDENT_AMBULATORY_CARE_PROVIDER_SITE_OTHER): Payer: Medicare Other | Admitting: *Deleted

## 2010-11-21 DIAGNOSIS — I6350 Cerebral infarction due to unspecified occlusion or stenosis of unspecified cerebral artery: Secondary | ICD-10-CM

## 2010-11-21 DIAGNOSIS — I4891 Unspecified atrial fibrillation: Secondary | ICD-10-CM

## 2010-11-21 LAB — POCT INR: INR: 2.8

## 2010-11-24 ENCOUNTER — Telehealth: Payer: Self-pay | Admitting: *Deleted

## 2010-11-24 ENCOUNTER — Ambulatory Visit (INDEPENDENT_AMBULATORY_CARE_PROVIDER_SITE_OTHER): Payer: Medicare Other | Admitting: *Deleted

## 2010-11-24 DIAGNOSIS — I495 Sick sinus syndrome: Secondary | ICD-10-CM

## 2010-11-24 LAB — PACEMAKER DEVICE OBSERVATION
BMOD-0002RV: 12
BRDY-0002RV: 70 {beats}/min
BRDY-0004RV: 120 {beats}/min
RV LEAD THRESHOLD: 0.5 V

## 2010-11-24 NOTE — Telephone Encounter (Signed)
You can go back to 1 metformin a day for now. F/u w/Dr Norins next wk - OV Thx

## 2010-11-24 NOTE — Progress Notes (Signed)
PPM check 

## 2010-11-24 NOTE — Telephone Encounter (Signed)
Spoke w/pt - She is was changed from glipizide to metformin due to getting "shakey" on glipizide. Pt was on 1 metformin once daily then increased to 1 bid. She c/o one to two loose stools after eating and taking metformin. No pain, blood or mucus. PCP is out of the office, please advise.

## 2010-11-24 NOTE — Telephone Encounter (Signed)
Pt not avail, gave detailed message to husband.

## 2010-12-01 ENCOUNTER — Ambulatory Visit (INDEPENDENT_AMBULATORY_CARE_PROVIDER_SITE_OTHER): Payer: Medicare Other | Admitting: General Surgery

## 2010-12-01 ENCOUNTER — Telehealth: Payer: Self-pay

## 2010-12-01 VITALS — BP 100/68 | HR 72 | Resp 20 | Ht 65.0 in | Wt 176.0 lb

## 2010-12-01 DIAGNOSIS — C50919 Malignant neoplasm of unspecified site of unspecified female breast: Secondary | ICD-10-CM

## 2010-12-01 DIAGNOSIS — C50912 Malignant neoplasm of unspecified site of left female breast: Secondary | ICD-10-CM

## 2010-12-01 NOTE — Telephone Encounter (Signed)
Called pt no answer will try back later

## 2010-12-01 NOTE — Progress Notes (Signed)
Operation:  Left partial mastectomy  Date:  January 27, 2000  Stage:  T1cNx  Hormone receptor status:  Estrogen positive  HPI:  [name] is here for followup of her left breast cancer.  She gets occasional pain in the incision.  No new masses.  No adenopathy.  PE:  Gen- WDWN in nad.  Right breast- no palpable masses, suspicious contrast, nipple discharge  Left breast-well-healed incision at 3:00 position with some indentation; no masses noted. Nodes-no palpable cervical, supraclavicular, or axillary adenopathy.  Assessment:  Left breast cancer-no clinical evidence of recurrence.  Plan:  Return visit 4 months.

## 2010-12-01 NOTE — Telephone Encounter (Signed)
Pt is requesting a return call regarding Metformin. Pt states she left a message last week but her call was not returned (per last telephone encounter information was given to husband because pt was not available). Please call.

## 2010-12-02 LAB — COMPREHENSIVE METABOLIC PANEL
ALT: 19
AST: 23
Alkaline Phosphatase: 64
CO2: 27
Calcium: 8.5
GFR calc Af Amer: >60
Glucose, Bld: 115 — ABNORMAL HIGH
Potassium: 3.5
Sodium: 139
Total Protein: 6.2

## 2010-12-02 LAB — BASIC METABOLIC PANEL
BUN: 16
BUN: 18
CO2: 25
Calcium: 8.9
Chloride: 105
Chloride: 106
Creatinine, Ser: 1.11
GFR calc non Af Amer: 48 — ABNORMAL LOW
GFR calc non Af Amer: 50 — ABNORMAL LOW
Glucose, Bld: 126 — ABNORMAL HIGH
Glucose, Bld: 163 — ABNORMAL HIGH
Potassium: 3.4 — ABNORMAL LOW
Potassium: 4
Sodium: 140
Sodium: 141

## 2010-12-02 LAB — APTT: aPTT: 35

## 2010-12-02 LAB — CK TOTAL AND CKMB (NOT AT ARMC)
CK, MB: 1.5
Relative Index: INVALID
Total CK: 75

## 2010-12-02 LAB — CBC
HCT: 38.9
Hemoglobin: 13.1
MCHC: 33.6
RDW: 16.3 — ABNORMAL HIGH

## 2010-12-02 LAB — CARDIAC PANEL(CRET KIN+CKTOT+MB+TROPI)
CK, MB: 1.6
Total CK: 75
Total CK: 81

## 2010-12-02 LAB — PROTIME-INR
INR: 1.8 — ABNORMAL HIGH
INR: 1.9 — ABNORMAL HIGH
Prothrombin Time: 21.4 — ABNORMAL HIGH

## 2010-12-02 LAB — MAGNESIUM: Magnesium: 2.6 — ABNORMAL HIGH

## 2010-12-02 LAB — TSH
TSH: 1.742
TSH: 2.026

## 2010-12-02 LAB — DIGOXIN LEVEL: Digoxin Level: 0.5 — ABNORMAL LOW

## 2010-12-02 LAB — T4, FREE: Free T4: 1.11

## 2010-12-05 LAB — PROTIME-INR: Prothrombin Time: 30.6 — ABNORMAL HIGH

## 2010-12-05 LAB — CBC
MCHC: 33.8
MCV: 88.3
RBC: 4.58
RDW: 17.6 — ABNORMAL HIGH

## 2010-12-05 LAB — BASIC METABOLIC PANEL
CO2: 23
Calcium: 9.3
Chloride: 103
Creatinine, Ser: 1.33 — ABNORMAL HIGH
GFR calc Af Amer: 47 — ABNORMAL LOW
Glucose, Bld: 152 — ABNORMAL HIGH

## 2010-12-05 NOTE — Telephone Encounter (Signed)
I tried to call pt back 3 or 4 times last week. She has many question with medication so she has sch an OV with Dr Debby Bud next week

## 2010-12-08 ENCOUNTER — Encounter: Payer: Self-pay | Admitting: Internal Medicine

## 2010-12-08 ENCOUNTER — Ambulatory Visit (INDEPENDENT_AMBULATORY_CARE_PROVIDER_SITE_OTHER): Payer: Medicare Other | Admitting: Internal Medicine

## 2010-12-08 DIAGNOSIS — I5033 Acute on chronic diastolic (congestive) heart failure: Secondary | ICD-10-CM

## 2010-12-08 DIAGNOSIS — E119 Type 2 diabetes mellitus without complications: Secondary | ICD-10-CM

## 2010-12-08 NOTE — Patient Instructions (Signed)
Heart failure - at this time it is ok to take lasix (furosemide) 80 mg in AM and 40 mg in PM. Weigh yourself daily: if you gain more than 2 lbs in 2 days then you will need to resume taking 80 mg in the PM until your weight comes down.  Pacemaker management - I do not know why Dr. Graciela Husbands has requested that you come in to see him.  Diabetes - we want good control but not overly tight control ( we don't want perfection to be the enemy of the good) and want to avoid LOW blood sugars. For now continue taking metformin 500 mg once a day until your bowels are stable and regular. Check your blood sugar from time to time ( 3 times a week). If it is running high, consistently greater than 150, we may need to increase the metformin to twice a day.   Continue all your other medications.

## 2010-12-09 NOTE — Progress Notes (Signed)
  Subjective:    Patient ID: [name], female    DOB: 01-09-1932, 77 y.o.   MRN: [id number]  HPI [name] is a complex medical patient with heart disease, pulmonary disease and diabetes. She had recently been started on metformin instead of glimeperide due to episodes of hypoglycemia. She had GI side effect when she went from metformin 500mg  qd to bid. See phone notes. There was also missed communication about this issue. She reports that after reverting to metformin daily she has seen a reduction and resolution of GI problems of abdominal bloating,cramping and diarrhea. She reports CBGs have been in the 130-150 range when she checks.  She reports that she had a bad episode of leg cramps. She had been taking 80mg  furosdemide bid but after the cramp reduced the dose to 80 mg AM and 40 mg PM. She has not had any recurrence. She also reports that she has had no breathing problem and has not noted any weight gain. Chart reviewed - last Bmet in September with K = 4.2.  She has had recent monitoring of her pacemaker - she has been asked to see Dr. Graciela Husbands in follow-up.  I have reviewed the patient's medical history in detail and updated the computerized patient record.    Review of Systems System review is negative for any constitutional, cardiac, pulmonary, GI or neuro symptoms or complaints other than as described in the HPI.     Objective:   Physical Exam Vitals - reviewed - good BP control, stable weight Gen'l - older white woman in no distress HEENT C&S clear Cor - RRR PUlm - normal respirations, no increased WOB       Assessment & Plan:

## 2010-12-09 NOTE — Assessment & Plan Note (Signed)
No evidence of decompensation on lower dose of lasix.  Plan - "rule of 2's" modified: if she gains 2+ lbs in 2 days she will need to resume prior dose of lasix, i.e. 80 mg bid.

## 2010-12-09 NOTE — Assessment & Plan Note (Signed)
Problems with metformin side effects now improving.  Plan - she may continue on metformin 500mg  daily.            Continue to monitor CBGs - if consistently greater than 140 preprandial will increase to bid.

## 2010-12-10 ENCOUNTER — Other Ambulatory Visit: Payer: Self-pay | Admitting: *Deleted

## 2010-12-10 MED ORDER — METFORMIN HCL 500 MG PO TABS: 500.0000 mg | ORAL_TABLET | Freq: Two times a day (BID) | ORAL | Status: DC

## 2010-12-10 MED ORDER — ATORVASTATIN CALCIUM 20 MG PO TABS: 20.0000 mg | ORAL_TABLET | Freq: Every day | ORAL | Status: DC

## 2010-12-10 MED ORDER — ATENOLOL 25 MG PO TABS: ORAL_TABLET | ORAL | Status: DC

## 2010-12-11 ENCOUNTER — Ambulatory Visit (INDEPENDENT_AMBULATORY_CARE_PROVIDER_SITE_OTHER): Payer: Medicare Other | Admitting: *Deleted

## 2010-12-11 ENCOUNTER — Encounter: Payer: Self-pay | Admitting: Internal Medicine

## 2010-12-11 DIAGNOSIS — I495 Sick sinus syndrome: Secondary | ICD-10-CM

## 2010-12-11 LAB — PACEMAKER DEVICE OBSERVATION
AL IMPEDENCE PM: 491 Ohm
DEVICE MODEL PM: 1409809
RV LEAD IMPEDENCE PM: 495 Ohm
RV LEAD THRESHOLD: 0.875 V
VENTRICULAR PACING PM: 100

## 2010-12-12 LAB — BASIC METABOLIC PANEL
BUN: 24 mg/dL — ABNORMAL HIGH (ref 6–23)
Calcium: 8.6 mg/dL (ref 8.4–10.5)
GFR calc non Af Amer: 46 mL/min — ABNORMAL LOW (ref >60)
Potassium: 4.2 mEq/L (ref 3.5–5.1)
Sodium: 139 mEq/L (ref 135–145)

## 2010-12-12 LAB — GLUCOSE, CAPILLARY
Glucose-Capillary: 159 mg/dL — ABNORMAL HIGH (ref 70–99)
Glucose-Capillary: 264 mg/dL — ABNORMAL HIGH (ref 70–99)
Glucose-Capillary: 278 mg/dL — ABNORMAL HIGH (ref 70–99)
Glucose-Capillary: 292 mg/dL — ABNORMAL HIGH (ref 70–99)

## 2010-12-12 LAB — CBC
HCT: 31.4 % — ABNORMAL LOW (ref 36.0–46.0)
MCHC: 33.4 g/dL (ref 30.0–36.0)
MCV: 90.1 fL (ref 78.0–100.0)
Platelets: 311 10*3/uL (ref 150–400)
RBC: 3.57 MIL/uL — ABNORMAL LOW (ref 3.87–5.11)
RBC: 3.68 MIL/uL — ABNORMAL LOW (ref 3.87–5.11)
RDW: 16.7 % — ABNORMAL HIGH (ref 11.5–15.5)
WBC: 15 10*3/uL — ABNORMAL HIGH (ref 4.0–10.5)
WBC: 9.4 10*3/uL (ref 4.0–10.5)

## 2010-12-12 LAB — COMPREHENSIVE METABOLIC PANEL
ALT: 56 U/L — ABNORMAL HIGH (ref 0–35)
AST: 37 U/L (ref 0–37)
AST: 40 U/L — ABNORMAL HIGH (ref 0–37)
Albumin: 2.4 g/dL — ABNORMAL LOW (ref 3.5–5.2)
BUN: 11 mg/dL (ref 6–23)
CO2: 24 mEq/L (ref 19–32)
CO2: 26 mEq/L (ref 19–32)
Calcium: 8.8 mg/dL (ref 8.4–10.5)
Chloride: 105 mEq/L (ref 96–112)
Creatinine, Ser: 1 mg/dL (ref 0.4–1.2)
GFR calc Af Amer: >60 mL/min (ref >60)
GFR calc Af Amer: >60 mL/min (ref >60)
GFR calc non Af Amer: 54 mL/min — ABNORMAL LOW (ref >60)
GFR calc non Af Amer: 55 mL/min — ABNORMAL LOW (ref >60)
Potassium: 3.9 mEq/L (ref 3.5–5.1)
Sodium: 135 mEq/L (ref 135–145)
Total Bilirubin: 0.5 mg/dL (ref 0.3–1.2)

## 2010-12-12 LAB — CARDIAC PANEL(CRET KIN+CKTOT+MB+TROPI)
CK, MB: 1.8 ng/mL (ref 0.3–4.0)
Total CK: 82 U/L (ref 7–177)
Troponin I: 0.01 ng/mL (ref 0.00–0.06)

## 2010-12-12 LAB — URINE CULTURE: Special Requests: NEGATIVE

## 2010-12-12 LAB — SEDIMENTATION RATE: Sed Rate: 108 mm/hr — ABNORMAL HIGH (ref 0–22)

## 2010-12-12 LAB — B-NATRIURETIC PEPTIDE (CONVERTED LAB): Pro B Natriuretic peptide (BNP): 46 pg/mL (ref 0.0–100.0)

## 2010-12-19 ENCOUNTER — Ambulatory Visit (INDEPENDENT_AMBULATORY_CARE_PROVIDER_SITE_OTHER): Payer: Medicare Other | Admitting: *Deleted

## 2010-12-19 DIAGNOSIS — I6350 Cerebral infarction due to unspecified occlusion or stenosis of unspecified cerebral artery: Secondary | ICD-10-CM

## 2010-12-19 DIAGNOSIS — I4891 Unspecified atrial fibrillation: Secondary | ICD-10-CM

## 2010-12-19 LAB — POCT INR: INR: 3.1

## 2010-12-22 ENCOUNTER — Ambulatory Visit
Admission: RE | Admit: 2010-12-22 | Discharge: 2010-12-22 | Disposition: A | Discharge status: Home / Self Care | Payer: Medicare Other | Source: Ambulatory Visit | Attending: Internal Medicine | Admitting: Internal Medicine

## 2010-12-22 DIAGNOSIS — Z9889 Other specified postprocedural states: Secondary | ICD-10-CM

## 2010-12-22 DIAGNOSIS — C50919 Malignant neoplasm of unspecified site of unspecified female breast: Secondary | ICD-10-CM

## 2010-12-23 ENCOUNTER — Other Ambulatory Visit: Payer: Self-pay | Admitting: Internal Medicine

## 2010-12-23 ENCOUNTER — Ambulatory Visit (INDEPENDENT_AMBULATORY_CARE_PROVIDER_SITE_OTHER): Payer: Medicare Other | Admitting: Internal Medicine

## 2010-12-23 VITALS — BP 118/72 | HR 77 | Temp 97.5°F | Wt 173.0 lb

## 2010-12-23 DIAGNOSIS — J449 Chronic obstructive pulmonary disease, unspecified: Secondary | ICD-10-CM

## 2010-12-23 DIAGNOSIS — E119 Type 2 diabetes mellitus without complications: Secondary | ICD-10-CM

## 2010-12-23 DIAGNOSIS — Z23 Encounter for immunization: Secondary | ICD-10-CM

## 2010-12-23 DIAGNOSIS — E785 Hyperlipidemia, unspecified: Secondary | ICD-10-CM

## 2010-12-23 DIAGNOSIS — I5033 Acute on chronic diastolic (congestive) heart failure: Secondary | ICD-10-CM

## 2010-12-23 NOTE — Assessment & Plan Note (Signed)
Well compensated with no respiratory distress.  Plan - continue present regimen.

## 2010-12-23 NOTE — Assessment & Plan Note (Signed)
Last lipid profile August with great control.   Plan - repeat lab in Aug '13.

## 2010-12-23 NOTE — Progress Notes (Signed)
  Subjective:    Patient ID: [name], female    DOB: Mar 18, 1931, 77 y.o.   MRN: [id number]  HPI [name] returns for follow-up, after her last visit October 1. She has seen [name] for device check - she will need to return for follow-up as directed. She reports that she did try again to take metformin BID but she did have GI side affects so she went back to once daily. She reports CBGs are controlled. Last A1C 6.9% in July '12  She does follow with dermatology and is having a small lesion at the left temple with some scaling.   No evidence decompensation re: heart failure.   No respiratory distress or increased shortness of breath.  I have reviewed the patient's medical history in detail and updated the computerized patient record.    Review of Systems System review is negative for any constitutional, cardiac, pulmonary, GI or neuro symptoms or complaints other than as described in the HPI.     Objective:   Physical Exam Vitals - stable Gen'l - WNWD HEENT - C&S clear Chest - CTAP, no rales or wheeze, no increased work of breathing Cor - RRR MXK- required minimal assistance to get up to the exam table.        Assessment & Plan:

## 2010-12-23 NOTE — Assessment & Plan Note (Signed)
Stable with no respiratory distress at this time.  Plan - continue her present regimen.

## 2010-12-23 NOTE — Assessment & Plan Note (Signed)
Last A1C 6.9%. She is on metformin 500 mg daily. She does try to follow an appropriate diet.  Plan - A1C Nov '12 - with recommendations to follow.

## 2011-01-13 ENCOUNTER — Other Ambulatory Visit: Payer: Self-pay | Admitting: Oncology

## 2011-01-13 ENCOUNTER — Other Ambulatory Visit (HOSPITAL_BASED_OUTPATIENT_CLINIC_OR_DEPARTMENT_OTHER): Payer: Medicare Other

## 2011-01-13 DIAGNOSIS — C50919 Malignant neoplasm of unspecified site of unspecified female breast: Secondary | ICD-10-CM

## 2011-01-13 LAB — CBC WITH DIFFERENTIAL/PLATELET
BASO%: 0.7 % (ref 0.0–2.0)
Basophils Absolute: 0.1 10*3/uL (ref 0.0–0.1)
EOS%: 1.7 % (ref 0.0–7.0)
HGB: 14.4 g/dL (ref 11.6–15.9)
MCH: 30.9 pg (ref 25.1–34.0)
MCHC: 33.7 g/dL (ref 31.5–36.0)
MCV: 91.6 fL (ref 79.5–101.0)
MONO%: 7.9 % (ref 0.0–14.0)
RBC: 4.65 10*6/uL (ref 3.70–5.45)
RDW: 15.7 % — ABNORMAL HIGH (ref 11.2–14.5)
lymph#: 3.1 10*3/uL (ref 0.9–3.3)

## 2011-01-13 LAB — CANCER ANTIGEN 27.29: CA 27.29: 19 U/mL (ref 0–39)

## 2011-01-14 LAB — COMPREHENSIVE METABOLIC PANEL
ALT: 15 U/L (ref 0–35)
AST: 21 U/L (ref 0–37)
Albumin: 3.7 g/dL (ref 3.5–5.2)
Alkaline Phosphatase: 79 U/L (ref 39–117)
Glucose, Bld: 89 mg/dL (ref 70–99)
Potassium: 3.8 mEq/L (ref 3.5–5.3)
Sodium: 139 mEq/L (ref 135–145)
Total Bilirubin: 0.4 mg/dL (ref 0.3–1.2)
Total Protein: 7.5 g/dL (ref 6.0–8.3)

## 2011-01-16 ENCOUNTER — Encounter: Payer: Medicare Other | Admitting: *Deleted

## 2011-01-19 ENCOUNTER — Ambulatory Visit (INDEPENDENT_AMBULATORY_CARE_PROVIDER_SITE_OTHER): Payer: Medicare Other | Admitting: *Deleted

## 2011-01-19 DIAGNOSIS — I4891 Unspecified atrial fibrillation: Secondary | ICD-10-CM

## 2011-01-19 DIAGNOSIS — I6350 Cerebral infarction due to unspecified occlusion or stenosis of unspecified cerebral artery: Secondary | ICD-10-CM

## 2011-01-20 ENCOUNTER — Telehealth: Payer: Self-pay | Admitting: *Deleted

## 2011-01-20 ENCOUNTER — Ambulatory Visit (HOSPITAL_BASED_OUTPATIENT_CLINIC_OR_DEPARTMENT_OTHER): Payer: Medicare Other | Admitting: Oncology

## 2011-01-20 VITALS — BP 99/62 | HR 83 | Temp 98.0°F | Ht 65.0 in | Wt 172.1 lb

## 2011-01-20 DIAGNOSIS — F17200 Nicotine dependence, unspecified, uncomplicated: Secondary | ICD-10-CM

## 2011-01-20 DIAGNOSIS — Z170 Estrogen receptor positive status [ER+]: Secondary | ICD-10-CM

## 2011-01-20 DIAGNOSIS — M949 Disorder of cartilage, unspecified: Secondary | ICD-10-CM

## 2011-01-20 DIAGNOSIS — C50919 Malignant neoplasm of unspecified site of unspecified female breast: Secondary | ICD-10-CM

## 2011-01-20 NOTE — Telephone Encounter (Signed)
GAVE PATIENT APPOINTMENT FOR 07-2011.  

## 2011-01-20 NOTE — Progress Notes (Signed)
ID: Aileen Fass   Interval History:   [name] returns today for followup of her breast cancer. Interval history is remarkable chiefly for continuing issues with (. She tells me she spent $750 getting shots with very little benefit. She wonders if she could survive a right knee replacement given her lungs. Unfortunately she does continue to smoke we have discussed this extensively previously and I repeated our offer to help her quit smoking again. This is however not something she is motivated to doing yet.  She is also very concerned about her husband she feels he has increasing confusion she got a medullar but she thinks he's a bit abusive towards the dog although he does walk the dog about 5 times a day. At this point she is not ready to let the dog ago although we talked about the possibility of putting up for adoption through one of the local veterinarian's.    ROS:  Mostly she does gardening sitting on a stool but she describes herself as fatigue of she certainly can go upstairs very well and is frequently short of breath she doesn't have a cough and phlegm every morning but she does fairly frequently. Otherwise she denies unusual headaches visual changes difficulty swallowing or pain on swallowing change in bowel or bladder habits rash bleeding or fever. A detailed review of systems was noncontributory  Medications: I have reviewed the patient's current medications.   Current Outpatient Prescriptions  Medication Sig Dispense Refill  . aspirin 81 MG EC tablet Take 81 mg by mouth daily.        Marland Kitchen atenolol (TENORMIN) 25 MG tablet Take once a day for essential tremor. If need be it can be advanced to twice a day.  180 tablet  3  . atorvastatin (LIPITOR) 20 MG tablet Take 1 tablet (20 mg total) by mouth daily.  90 tablet  3  . cilostazol (PLETAL) 100 MG tablet Take 100 mg by mouth 2 (two) times daily.        . Ferrous Sulfate (FE-CAPS) 250 MG CPCR Take 1 capsule by mouth daily.        .  fish oil-omega-3 fatty acids 1000 MG capsule Take 1 g by mouth 2 (two) times daily.        . Fluticasone-Salmeterol (ADVAIR DISKUS) 250-50 MCG/DOSE AEPB Inhale 1 puff into the lungs 2 (two) times daily.        . furosemide (LASIX) 40 MG tablet Take 80 mg by mouth 2 (two) times daily.        . Glucosamine-Chondroit-Vit [name]-Mn (GLUCOSAMINE 1500 COMPLEX) CAPS Take 1 capsule by mouth 2 (two) times daily.        Marland Kitchen guaiFENesin (MUCINEX) 600 MG 12 hr tablet Take 1,200 mg by mouth 2 (two) times daily.       Marland Kitchen letrozole (FEMARA) 2.5 MG tablet Take 1 tablet by mouth Daily.      Marland Kitchen levalbuterol (XOPENEX HFA) 45 MCG/ACT inhaler Inhale 2 puffs into the lungs every 6 (six) hours as needed.        . Magnesium 250 MG TABS Take 1 tablet by mouth daily.        . metFORMIN (GLUCOPHAGE) 500 MG tablet Take 500 mg by mouth daily with breakfast.        . mometasone (NASONEX) 50 MCG/ACT nasal spray 2 sprays by Nasal route daily.  51 g  4  . NIACINAMIDE PO Take by mouth 3 (three) times daily.        Marland Kitchen  potassium chloride SA (K-DUR,KLOR-CON) 20 MEQ tablet Take 2 tabs every am and 3 tabs every pm  450 tablet  3  . tiotropium (SPIRIVA HANDIHALER) 18 MCG inhalation capsule Place 1 capsule (18 mcg total) into inhaler and inhale daily.  90 capsule  4  . vitamin E 100 UNIT capsule Twice weekly       . warfarin (COUMADIN) 5 MG tablet Take as directed by Anticoagulation clinic   135 tablet  3     Objective: Vital signs in last 24 hours: BP 99/62  Pulse 83  Temp 98 F (36.7 [name])  Ht 5\' 5"  (1.651 m)  Wt 172 lb 1.6 oz (78.064 kg)  BMI 28.64 kg/m2   Physical Exam:    Sclerae unicteric  Oropharynx clear  No peripheral adenopathy  Lungs clear -- no rales or rhonchi  Heart regular rate and rhythm  Abdomen benign  MSK no focal spinal tenderness, no peripheral edema  Neuro nonfocal  Breast exam: Right breast no suspicious findings left breast status post lumpectomy no evidence of local recurrent  Lab Results:   BMET      Component Value Date/Time   NA 139 01/13/2011 1515   K 3.8 01/13/2011 1515   CL 101 01/13/2011 1515   CO2 29 01/13/2011 1515   GLUCOSE 89 01/13/2011 1515   BUN 13 01/13/2011 1515   BUN 18 07/14/2010 0922   CREATININE 1.04 01/13/2011 1515   CREATININE 1.0 07/14/2010 0922   CALCIUM 9.8 01/13/2011 1515   GFRNONAA 52* 01/28/2010 1205   GFRAA  Value: >60        The eGFR has been calculated using the MDRD equation. This calculation has not been validated in all clinical situations. eGFR's persistently <60 mL/min signify possible Chronic Kidney Disease. 01/28/2010 1205     CMP     Component Value Date/Time   NA 139 01/13/2011 1515   K 3.8 01/13/2011 1515   CL 101 01/13/2011 1515   CO2 29 01/13/2011 1515   GLUCOSE 89 01/13/2011 1515   BUN 13 01/13/2011 1515   BUN 18 07/14/2010 0922   CREATININE 1.04 01/13/2011 1515   CREATININE 1.0 07/14/2010 0922   CALCIUM 9.8 01/13/2011 1515   PROT 7.5 01/13/2011 1515   ALBUMIN 3.7 01/13/2011 1515   AST 21 01/13/2011 1515   ALT 15 01/13/2011 1515   ALKPHOS 79 01/13/2011 1515   BILITOT 0.4 01/13/2011 1515   GFRNONAA 52* 01/28/2010 1205   GFRAA  Value: >60        The eGFR has been calculated using the MDRD equation. This calculation has not been validated in all clinical situations. eGFR's persistently <60 mL/min signify possible Chronic Kidney Disease. 01/28/2010 1205    CBC    Component Value Date/Time   WBC 8.2 10/10/2010 1110   RBC 4.55 10/10/2010 1110   HGB 14.4 01/13/2011 1515   HGB 13.9 10/10/2010 1110   HCT 42.6 01/13/2011 1515   HCT 41.6 10/10/2010 1110   PLT 205 01/13/2011 1515   PLT 169.0 10/10/2010 1110   MCV 91.6 01/13/2011 1515   MCV 91.4 10/10/2010 1110   MCH 29.9 04/07/2010 1559   MCHC 33.3 10/10/2010 1110   RDW 16.2* 10/10/2010 1110   LYMPHSABS 2.4 10/10/2010 1110   MONOABS 0.7 10/10/2010 1110   EOSABS 0.2 01/13/2011 1515   EOSABS 0.2 10/10/2010 1110   BASOSABS 0.1 01/13/2011 1515   BASOSABS 0.1 10/10/2010 1110        Studies/Results: Mammography of October of this  year was unremarkable she had a bone density January of this year which showed moderate osteopenia and she had a CT scan of the chest in May which did not show evidence of breast cancer spread or recurrence  Assessment:  77 year old [place] woman status post left lumpectomy November of 2011 for a PET 1CN0G2 invasive ductal carcinoma strongly estrogen receptor positive with a low proliferation fraction (7%), but progesterone receptor and HER-2 negative, on letrozole [name] since December of 2011 with excellent tolerance.  PLAN: From a breast cancer point of view the plan is to continue letrozole for a total of 5 years and continue mammograms yearly. I have strongly encouraged her to quit smoking at least for 2 weeks prior to any surgery and certainly for 2 weeks later so she has a better chance of healing. Possibly she had a knee replacement it could be done under an epidural. She's cannot explore this possibility with Dr.Aluisio. Otherwise she is going to return to see [name] again in 6 months for routine followup she knows to call for any problems that may develop before then     [name] 01/20/2011

## 2011-01-26 ENCOUNTER — Ambulatory Visit: Payer: Medicare Other | Admitting: Internal Medicine

## 2011-02-06 ENCOUNTER — Ambulatory Visit (INDEPENDENT_AMBULATORY_CARE_PROVIDER_SITE_OTHER): Payer: Medicare Other | Admitting: Critical Care Medicine

## 2011-02-06 ENCOUNTER — Encounter: Payer: Self-pay | Admitting: Critical Care Medicine

## 2011-02-06 DIAGNOSIS — J449 Chronic obstructive pulmonary disease, unspecified: Secondary | ICD-10-CM

## 2011-02-06 NOTE — Progress Notes (Signed)
Subjective:    Patient ID: [name], female    DOB: 1932/03/02, 77 y.o.   MRN: [id number]  HPI  77 y.o.   white female with history of chronic obstructive lung disease, asthmatic bronchitis, and obstructive sleep apnea. Active smoker and has failed smoking cessation on multiple attempts.    7/31 Notes more congestion.  Doubled mucinex, this helps.  Mucus now clear to beige.  No fever.  Level dyspnea same.  11/30  SInce last OV doing ok,  Dyspnea the same.  No real wheeze.  No heartburn.  No chest pain. Hear not racing.  Still smoking 1-1.5PPD smoking  Past Medical History  Diagnosis Date  . CVA (cerebral vascular accident)   . Sleep apnea     associated with hypersomnia  . Amiodarone pulmonary toxicity   . Renal disease     CHRONIC  . Diabetes   . Tobacco abuse   . Diastolic heart failure     Acute on Chronic  . Pacemaker     Permanent  . AF (atrial fibrillation)      AV ablation 9/09 [facility] per Dr Sampson Goon - AV node ablation 9/11 Dr Graciela Husbands  . Hyperlipidemia   . GERD (gastroesophageal reflux disease)   . [name] (coronary artery disease)     (not sure of this 11/10  . COPD (chronic obstructive pulmonary disease)     emphysema -FeV1 73% DLCO 53% 5/09  . PAD (peripheral artery disease)     w/hx right iliac/SFA stenting and left leg PTA  . Invasive ductal carcinoma of breast 2011    LEFT   . OA (osteoarthritis) of knee     RIGHT  . Right sided sciatica      Family History  Problem Relation Age of Onset  . Heart disease Father      History   Social History  . Marital Status: Married    Spouse Name: N/A    Number of Children: N/A  . Years of Education: N/A   Occupational History  . Not on file.   Social History Main Topics  . Smoking status: Current Everyday Smoker -- 1.0 packs/day    Types: Cigarettes  . Smokeless tobacco: Never Used   Comment: started smoking at age 53  . Alcohol Use: Yes     wine occasionally  . Drug Use: Not on file  .  Sexually Active: Not on file   Other Topics Concern  . Not on file   Social History Narrative   HS Graduate; [employer]. [employer].  Married '61.  2 sons - '70, '71; Dtrs - '64,'68; 6 grandchildren.  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 Interests -Tai-chi & water aerobics, gardening, active lifestyle.  Marriage a bit stressful - SO w/membory problems and difficult behavior. She denies any personal safety concerns. End of life care: need to address at next OV      Allergies  Allergen Reactions  . Seasonal     RAGWEED SEASON     Outpatient Prescriptions Prior to Visit  Medication Sig Dispense Refill  . aspirin 81 MG EC tablet Take 81 mg by mouth daily.        Marland Kitchen atenolol (TENORMIN) 25 MG tablet Take once a day for essential tremor. If need be it can be advanced to twice a day.  180 tablet  3  . atorvastatin (LIPITOR)  20 MG tablet Take 1 tablet (20 mg total) by mouth daily.  90 tablet  3  . cilostazol (PLETAL) 100 MG tablet Take 100 mg by mouth 2 (two) times daily.        . Ferrous Sulfate (FE-CAPS) 250 MG CPCR Take 1 capsule by mouth daily.        . fish oil-omega-3 fatty acids 1000 MG capsule Take 1 g by mouth 2 (two) times daily.        . Fluticasone-Salmeterol (ADVAIR DISKUS) 250-50 MCG/DOSE AEPB Inhale 1 puff into the lungs 2 (two) times daily.        . furosemide (LASIX) 40 MG tablet Take 80 mg by mouth 2 (two) times daily.        . Glucosamine-Chondroit-Vit C-Mn (GLUCOSAMINE 1500 COMPLEX) CAPS Take 1 capsule by mouth 2 (two) times daily.        Marland Kitchen guaiFENesin (MUCINEX) 600 MG 12 hr tablet Take 1,200 mg by mouth 2 (two) times daily.       Marland Kitchen letrozole (FEMARA) 2.5 MG tablet Take 1 tablet by mouth Daily.      Marland Kitchen levalbuterol (XOPENEX HFA) 45 MCG/ACT inhaler Inhale 2 puffs into the lungs every 6 (six) hours as needed.        . Magnesium  250 MG TABS Take 1 tablet by mouth daily.        . metFORMIN (GLUCOPHAGE) 500 MG tablet Take 500 mg by mouth daily with breakfast.        . mometasone (NASONEX) 50 MCG/ACT nasal spray 2 sprays by Nasal route daily.  51 g  4  . NIACINAMIDE PO Take by mouth 3 (three) times daily.        . potassium chloride SA (K-DUR,KLOR-CON) 20 MEQ tablet Take 2 tabs every am and 3 tabs every pm  450 tablet  3  . tiotropium (SPIRIVA HANDIHALER) 18 MCG inhalation capsule Place 1 capsule (18 mcg total) into inhaler and inhale daily.  90 capsule  4  . vitamin E 100 UNIT capsule Twice weekly       . warfarin (COUMADIN) 5 MG tablet Take as directed by Anticoagulation clinic   135 tablet  3     Review of Systems  Constitutional:   No  weight loss, night sweats,  Fevers, chills, fatigue, lassitude. HEENT:   No headaches,  Difficulty swallowing,  Tooth/dental problems,  Sore throat,                No sneezing, itching, ear ache, nasal congestion, post nasal drip,   CV:  No chest pain,  Orthopnea, PND, swelling in lower extremities, anasarca, dizziness, palpitations  GI  No heartburn, indigestion, abdominal pain, nausea, vomiting, diarrhea, change in bowel habits, loss of appetite  Resp: Notes  shortness of breath with exertion not  at rest.  No excess mucus, notes  productive cough,  No non-productive cough,  No coughing up of blood.  No change in color of mucus.  No wheezing.  No chest wall deformity  Skin: no rash or lesions.  GU: no dysuria, change in color of urine, no urgency or frequency.  No flank pain.  MS:  No joint pain or swelling.  No decreased range of motion.  No back pain.  Psych:  No change in mood or affect. No depression or anxiety.  No memory loss.     Objective:   Physical Exam  Gen: Pleasant, well-nourished, in no distress,  normal affect  ENT: No lesions,  mouth clear,  oropharynx clear, no postnasal drip  Neck: No JVD, no TMG, no carotid bruits  Lungs: No use of accessory  muscles, no dullness to percussion, distant bs, clear Cardiovascular: RRR, heart sounds normal, no murmur or gallops, no peripheral edema  Abdomen: soft and NT, no HSM,  BS normal  Musculoskeletal: No deformities, no cyanosis or clubbing  Neuro: alert, non focal  Skin: Warm, no lesions or rashes        Assessment & Plan:   COPD Moderate copd, stable but ongoing tobacco abuse Plan No change in inhaled or maintenance medications. Return in  4months     Updated Medication List Outpatient Encounter Prescriptions as of 02/06/2011  Medication Sig Dispense Refill  . aspirin 81 MG EC tablet Take 81 mg by mouth daily.        Marland Kitchen atenolol (TENORMIN) 25 MG tablet Take once a day for essential tremor. If need be it can be advanced to twice a day.  180 tablet  3  . atorvastatin (LIPITOR) 20 MG tablet Take 1 tablet (20 mg total) by mouth daily.  90 tablet  3  . cilostazol (PLETAL) 100 MG tablet Take 100 mg by mouth 2 (two) times daily.        . Ferrous Sulfate (FE-CAPS) 250 MG CPCR Take 1 capsule by mouth daily.        . fish oil-omega-3 fatty acids 1000 MG capsule Take 1 g by mouth 2 (two) times daily.        . Fluticasone-Salmeterol (ADVAIR DISKUS) 250-50 MCG/DOSE AEPB Inhale 1 puff into the lungs 2 (two) times daily.        . furosemide (LASIX) 40 MG tablet Take 80 mg by mouth 2 (two) times daily.        . Glucosamine-Chondroit-Vit C-Mn (GLUCOSAMINE 1500 COMPLEX) CAPS Take 1 capsule by mouth 2 (two) times daily.        Marland Kitchen guaiFENesin (MUCINEX) 600 MG 12 hr tablet Take 1,200 mg by mouth 2 (two) times daily.       Marland Kitchen letrozole (FEMARA) 2.5 MG tablet Take 1 tablet by mouth Daily.      Marland Kitchen levalbuterol (XOPENEX HFA) 45 MCG/ACT inhaler Inhale 2 puffs into the lungs every 6 (six) hours as needed.        . Magnesium 250 MG TABS Take 1 tablet by mouth daily.        . metFORMIN (GLUCOPHAGE) 500 MG tablet Take 500 mg by mouth daily with breakfast.        . mometasone (NASONEX) 50 MCG/ACT nasal spray  2 sprays by Nasal route daily.  51 g  4  . NIACINAMIDE PO Take by mouth 3 (three) times daily.        . potassium chloride SA (K-DUR,KLOR-CON) 20 MEQ tablet Take 2 tabs every am and 3 tabs every pm  450 tablet  3  . tiotropium (SPIRIVA HANDIHALER) 18 MCG inhalation capsule Place 1 capsule (18 mcg total) into inhaler and inhale daily.  90 capsule  4  . vitamin E 100 UNIT capsule Twice weekly       . warfarin (COUMADIN) 5 MG tablet Take as directed by Anticoagulation clinic   135 tablet  3

## 2011-02-06 NOTE — Patient Instructions (Signed)
No change in medications. Return in         4 months 

## 2011-02-07 NOTE — Assessment & Plan Note (Signed)
Moderate copd, stable but ongoing tobacco abuse Plan No change in inhaled or maintenance medications. Return in  4months

## 2011-02-10 ENCOUNTER — Encounter: Payer: Self-pay | Admitting: Internal Medicine

## 2011-02-10 DIAGNOSIS — I495 Sick sinus syndrome: Secondary | ICD-10-CM

## 2011-02-16 ENCOUNTER — Ambulatory Visit (INDEPENDENT_AMBULATORY_CARE_PROVIDER_SITE_OTHER): Payer: Medicare Other | Admitting: *Deleted

## 2011-02-16 DIAGNOSIS — I6350 Cerebral infarction due to unspecified occlusion or stenosis of unspecified cerebral artery: Secondary | ICD-10-CM

## 2011-02-16 DIAGNOSIS — I4891 Unspecified atrial fibrillation: Secondary | ICD-10-CM

## 2011-02-16 LAB — POCT INR: INR: 2.5

## 2011-03-06 ENCOUNTER — Other Ambulatory Visit: Payer: Self-pay | Admitting: Oncology

## 2011-03-06 DIAGNOSIS — C50919 Malignant neoplasm of unspecified site of unspecified female breast: Secondary | ICD-10-CM

## 2011-03-16 ENCOUNTER — Encounter: Payer: Medicare Other | Admitting: *Deleted

## 2011-03-18 ENCOUNTER — Ambulatory Visit (INDEPENDENT_AMBULATORY_CARE_PROVIDER_SITE_OTHER): Payer: Medicare Other | Admitting: *Deleted

## 2011-03-18 DIAGNOSIS — I4891 Unspecified atrial fibrillation: Secondary | ICD-10-CM

## 2011-03-18 DIAGNOSIS — L570 Actinic keratosis: Secondary | ICD-10-CM | POA: Diagnosis not present

## 2011-03-18 DIAGNOSIS — D1801 Hemangioma of skin and subcutaneous tissue: Secondary | ICD-10-CM | POA: Diagnosis not present

## 2011-03-18 DIAGNOSIS — I6350 Cerebral infarction due to unspecified occlusion or stenosis of unspecified cerebral artery: Secondary | ICD-10-CM | POA: Diagnosis not present

## 2011-03-26 DIAGNOSIS — E119 Type 2 diabetes mellitus without complications: Secondary | ICD-10-CM | POA: Diagnosis not present

## 2011-03-26 DIAGNOSIS — H26499 Other secondary cataract, unspecified eye: Secondary | ICD-10-CM | POA: Diagnosis not present

## 2011-03-26 DIAGNOSIS — H0230 Blepharochalasis unspecified eye, unspecified eyelid: Secondary | ICD-10-CM | POA: Diagnosis not present

## 2011-04-02 ENCOUNTER — Encounter (INDEPENDENT_AMBULATORY_CARE_PROVIDER_SITE_OTHER): Payer: Self-pay | Admitting: General Surgery

## 2011-04-22 ENCOUNTER — Ambulatory Visit (INDEPENDENT_AMBULATORY_CARE_PROVIDER_SITE_OTHER): Payer: Medicare Other | Admitting: *Deleted

## 2011-04-22 DIAGNOSIS — I4891 Unspecified atrial fibrillation: Secondary | ICD-10-CM | POA: Diagnosis not present

## 2011-04-22 DIAGNOSIS — I6350 Cerebral infarction due to unspecified occlusion or stenosis of unspecified cerebral artery: Secondary | ICD-10-CM

## 2011-04-22 LAB — POCT INR: INR: 3.5

## 2011-04-24 DIAGNOSIS — L570 Actinic keratosis: Secondary | ICD-10-CM | POA: Diagnosis not present

## 2011-04-28 ENCOUNTER — Encounter (INDEPENDENT_AMBULATORY_CARE_PROVIDER_SITE_OTHER): Payer: Self-pay | Admitting: General Surgery

## 2011-04-28 ENCOUNTER — Ambulatory Visit (INDEPENDENT_AMBULATORY_CARE_PROVIDER_SITE_OTHER): Payer: Medicare Other | Admitting: General Surgery

## 2011-04-28 VITALS — BP 106/54 | HR 75 | Temp 97.4°F | Ht 65.0 in | Wt 167.4 lb

## 2011-04-28 DIAGNOSIS — Z853 Personal history of malignant neoplasm of breast: Secondary | ICD-10-CM

## 2011-04-28 NOTE — Progress Notes (Signed)
Operation:  Left partial mastectomy  Date:  January 26, 2010  Stage:  T1cNx  Hormone receptor status:  Estrogen positive  HPI:  [name] is here for followup of her left breast cancer.  She denies any masses in her breasts.  No adenopathy.  PE:  Gen- WDWN in nad.  Right breast- no palpable masses, suspicious contrast, nipple discharge  Left breast-well-healed incision at 3:00 position with some indentation; no masses noted.   Nodes-no palpable cervical, supraclavicular, or axillary adenopathy.  Assessment:  Left breast cancer-no clinical evidence of recurrence.  Plan:  Return visit 5 months.

## 2011-04-28 NOTE — Patient Instructions (Signed)
Call if you find any lumps in your breasts. 

## 2011-05-12 ENCOUNTER — Encounter: Payer: Self-pay | Admitting: Internal Medicine

## 2011-05-12 DIAGNOSIS — E119 Type 2 diabetes mellitus without complications: Secondary | ICD-10-CM | POA: Diagnosis not present

## 2011-05-12 DIAGNOSIS — Z86010 Personal history of colonic polyps: Secondary | ICD-10-CM | POA: Diagnosis not present

## 2011-05-12 DIAGNOSIS — I495 Sick sinus syndrome: Secondary | ICD-10-CM

## 2011-05-12 DIAGNOSIS — I4891 Unspecified atrial fibrillation: Secondary | ICD-10-CM | POA: Diagnosis not present

## 2011-05-12 DIAGNOSIS — J449 Chronic obstructive pulmonary disease, unspecified: Secondary | ICD-10-CM | POA: Diagnosis not present

## 2011-05-12 DIAGNOSIS — G4730 Sleep apnea, unspecified: Secondary | ICD-10-CM | POA: Diagnosis not present

## 2011-05-15 ENCOUNTER — Ambulatory Visit (INDEPENDENT_AMBULATORY_CARE_PROVIDER_SITE_OTHER): Payer: Medicare Other | Admitting: *Deleted

## 2011-05-15 DIAGNOSIS — I4891 Unspecified atrial fibrillation: Secondary | ICD-10-CM | POA: Diagnosis not present

## 2011-05-15 DIAGNOSIS — I6350 Cerebral infarction due to unspecified occlusion or stenosis of unspecified cerebral artery: Secondary | ICD-10-CM | POA: Diagnosis not present

## 2011-05-15 LAB — POCT INR: INR: 3.1

## 2011-05-25 ENCOUNTER — Other Ambulatory Visit: Payer: Self-pay | Admitting: Gastroenterology

## 2011-05-25 DIAGNOSIS — Z86010 Personal history of colonic polyps: Secondary | ICD-10-CM

## 2011-05-26 ENCOUNTER — Encounter: Payer: Self-pay | Admitting: Internal Medicine

## 2011-05-26 ENCOUNTER — Ambulatory Visit (INDEPENDENT_AMBULATORY_CARE_PROVIDER_SITE_OTHER): Payer: Medicare Other | Admitting: Internal Medicine

## 2011-05-26 VITALS — BP 100/58 | HR 78 | Ht 66.0 in | Wt 168.8 lb

## 2011-05-26 DIAGNOSIS — F17200 Nicotine dependence, unspecified, uncomplicated: Secondary | ICD-10-CM

## 2011-05-26 DIAGNOSIS — Z950 Presence of cardiac pacemaker: Secondary | ICD-10-CM | POA: Diagnosis not present

## 2011-05-26 DIAGNOSIS — I2510 Atherosclerotic heart disease of native coronary artery without angina pectoris: Secondary | ICD-10-CM

## 2011-05-26 DIAGNOSIS — I4891 Unspecified atrial fibrillation: Secondary | ICD-10-CM | POA: Diagnosis not present

## 2011-05-26 LAB — PACEMAKER DEVICE OBSERVATION
AL IMPEDENCE PM: 488 Ohm
AL THRESHOLD: 0.75 V
BATTERY VOLTAGE: 2.78 V
RV LEAD THRESHOLD: 0.75 V

## 2011-05-26 NOTE — Assessment & Plan Note (Addendum)
Stable on current meds  Will stop asa

## 2011-05-26 NOTE — Assessment & Plan Note (Signed)
The patient's device was interrogated.  The information was reviewed. No changes were made in the programming.    

## 2011-05-26 NOTE — Progress Notes (Signed)
HPI  [name] is a 78 y.o. female is seen in followup for  pacer for bradycardia.  atrial fibrillation following pulmonary vein isolation twice, first at [facility] and then at [facility] in 12/10. She has recurrent atrial flutter which was repeatedly cardioverted w frequent and rapid reversion. Sh  underwent AV ablation which was associated w/ marked improvement in symptoms   She has an exacerbation of her copd   She has been diagnosed with breast cancer for which she is to undergo lumpectomy   She has COPD-which is problematic. She continues to smoke   Past Medical History  Diagnosis Date  . CVA (cerebral vascular accident)   . Sleep apnea     associated with hypersomnia  . Amiodarone pulmonary toxicity   . Renal disease     CHRONIC  . Diabetes   . Tobacco abuse   . Diastolic heart failure     Acute on Chronic  . Pacemaker     Permanent  . AF (atrial fibrillation)      AV ablation 9/09 [facility] per Dr Sampson Goon - AV node ablation 9/11 Dr Graciela Husbands  . Hyperlipidemia   . GERD (gastroesophageal reflux disease)   . [name] (coronary artery disease)     (not sure of this 11/10  . COPD (chronic obstructive pulmonary disease)     emphysema -FeV1 73% DLCO 53% 5/09  . PAD (peripheral artery disease)     w/hx right iliac/SFA stenting and left leg PTA  . Invasive ductal carcinoma of breast 2011    LEFT   . OA (osteoarthritis) of knee     RIGHT  . Right sided sciatica     Past Surgical History  Procedure Date  . Mastectomy, partial 02/03/2010    Left/Dr Rosenbower  . Cataract extraction, bilateral     with IOL/Dr Groat  . Breast lumpectomy     left breast    Current Outpatient Prescriptions  Medication Sig Dispense Refill  . aspirin 81 MG EC tablet Take 81 mg by mouth daily.        Marland Kitchen atenolol (TENORMIN) 25 MG tablet Take once a day for essential tremor. If need be it can be advanced to twice a day.  180 tablet  3  . atorvastatin (LIPITOR) 20 MG tablet Take 1 tablet (20 mg total)  by mouth daily.  90 tablet  3  . cilostazol (PLETAL) 100 MG tablet Take 100 mg by mouth 2 (two) times daily.        . Ferrous Sulfate (FE-CAPS) 250 MG CPCR Take 1 capsule by mouth daily.        . fish oil-omega-3 fatty acids 1000 MG capsule Take 1 g by mouth 2 (two) times daily.        . Fluticasone-Salmeterol (ADVAIR DISKUS) 250-50 MCG/DOSE AEPB Inhale 1 puff into the lungs 2 (two) times daily.        . furosemide (LASIX) 40 MG tablet Take by mouth. Take 2 tablets in the morning and 1 tablet at night      . Glucosamine-Chondroit-Vit C-Mn (GLUCOSAMINE 1500 COMPLEX) CAPS Take 1 capsule by mouth 2 (two) times daily.        Marland Kitchen guaiFENesin (MUCINEX) 600 MG 12 hr tablet Take 1,200 mg by mouth 2 (two) times daily.       Marland Kitchen letrozole (FEMARA) 2.5 MG tablet TAKE 1 TABLET DAILY  90 tablet  3  . levalbuterol (XOPENEX HFA) 45 MCG/ACT inhaler Inhale 2 puffs into the lungs every 6 (  six) hours as needed.        . Magnesium 250 MG TABS Take 1 tablet by mouth daily.        . metFORMIN (GLUCOPHAGE) 500 MG tablet Take 500 mg by mouth daily with breakfast.        . mometasone (NASONEX) 50 MCG/ACT nasal spray 2 sprays by Nasal route daily.  51 g  4  . NIACINAMIDE PO Take by mouth 3 (three) times daily.        . potassium chloride SA (K-DUR,KLOR-CON) 20 MEQ tablet Take 2 tabs every am and 3 tabs every pm      . tiotropium (SPIRIVA HANDIHALER) 18 MCG inhalation capsule Place 1 capsule (18 mcg total) into inhaler and inhale daily.  90 capsule  4  . vitamin E 100 UNIT capsule Twice weekly       . warfarin (COUMADIN) 5 MG tablet Take as directed by Anticoagulation clinic   135 tablet  3    Allergies  Allergen Reactions  . Seasonal     RAGWEED SEASON    Review of Systems negative except from HPI and PMH  Physical Exam BP 100/58  Pulse 78  Ht 5\' 6"  (1.676 m)  Wt 168 lb 12.8 oz (76.567 kg)  BMI 27.24 kg/m2 Well developed and nourished in no acute distress smell of cigs HENT normal Neck supple with  JVP-flat Clear Regular rate and rhythm, no murmurs or gallops Abd-soft with active BS No Clubbing cyanosis edema Skin-warm and dry A & Oriented  Grossly normal sensory and motor function  Assessment and  Plan

## 2011-05-26 NOTE — Assessment & Plan Note (Signed)
Permanent.. Stop Her aspirin and continue warfarin

## 2011-05-26 NOTE — Patient Instructions (Signed)
Your physician has recommended you make the following change in your medication:  1) Stop aspirin.  You should have your device checked in 3 months- [facility] will be in touch with you about this.  Your physician wants you to follow-up in: 1 year with Dr. Graciela Husbands. You will receive a reminder letter in the mail two months in advance. If you don't receive a letter, please call our office to schedule the follow-up appointment.

## 2011-05-26 NOTE — Assessment & Plan Note (Signed)
reveiwee again

## 2011-05-29 DIAGNOSIS — L570 Actinic keratosis: Secondary | ICD-10-CM | POA: Diagnosis not present

## 2011-06-02 ENCOUNTER — Other Ambulatory Visit: Payer: Medicare Other

## 2011-06-04 ENCOUNTER — Ambulatory Visit (INDEPENDENT_AMBULATORY_CARE_PROVIDER_SITE_OTHER): Payer: Medicare Other | Admitting: *Deleted

## 2011-06-04 ENCOUNTER — Other Ambulatory Visit: Payer: Self-pay | Admitting: Internal Medicine

## 2011-06-04 DIAGNOSIS — I6350 Cerebral infarction due to unspecified occlusion or stenosis of unspecified cerebral artery: Secondary | ICD-10-CM

## 2011-06-04 DIAGNOSIS — I4891 Unspecified atrial fibrillation: Secondary | ICD-10-CM

## 2011-06-04 LAB — POCT INR: INR: 2.7

## 2011-06-09 ENCOUNTER — Ambulatory Visit: Payer: Medicare Other | Admitting: Critical Care Medicine

## 2011-06-26 ENCOUNTER — Ambulatory Visit (INDEPENDENT_AMBULATORY_CARE_PROVIDER_SITE_OTHER): Payer: Medicare Other | Admitting: *Deleted

## 2011-06-26 DIAGNOSIS — I6350 Cerebral infarction due to unspecified occlusion or stenosis of unspecified cerebral artery: Secondary | ICD-10-CM | POA: Diagnosis not present

## 2011-06-26 DIAGNOSIS — I4891 Unspecified atrial fibrillation: Secondary | ICD-10-CM | POA: Diagnosis not present

## 2011-06-26 LAB — POCT INR: INR: 3

## 2011-07-08 ENCOUNTER — Other Ambulatory Visit: Payer: Self-pay

## 2011-07-08 ENCOUNTER — Other Ambulatory Visit: Payer: Self-pay | Admitting: Internal Medicine

## 2011-07-08 MED ORDER — POTASSIUM CHLORIDE CRYS ER 20 MEQ PO TBCR: 20.0000 meq | EXTENDED_RELEASE_TABLET | ORAL | Status: DC

## 2011-07-10 ENCOUNTER — Other Ambulatory Visit: Payer: Self-pay | Admitting: *Deleted

## 2011-07-10 DIAGNOSIS — C50919 Malignant neoplasm of unspecified site of unspecified female breast: Secondary | ICD-10-CM

## 2011-07-10 DIAGNOSIS — E559 Vitamin D deficiency, unspecified: Secondary | ICD-10-CM

## 2011-07-10 DIAGNOSIS — E785 Hyperlipidemia, unspecified: Secondary | ICD-10-CM

## 2011-07-10 DIAGNOSIS — E119 Type 2 diabetes mellitus without complications: Secondary | ICD-10-CM

## 2011-07-13 ENCOUNTER — Other Ambulatory Visit: Payer: Medicare Other | Admitting: Lab

## 2011-07-14 ENCOUNTER — Other Ambulatory Visit (HOSPITAL_BASED_OUTPATIENT_CLINIC_OR_DEPARTMENT_OTHER): Payer: Medicare Other | Admitting: Lab

## 2011-07-14 DIAGNOSIS — E119 Type 2 diabetes mellitus without complications: Secondary | ICD-10-CM

## 2011-07-14 DIAGNOSIS — C50919 Malignant neoplasm of unspecified site of unspecified female breast: Secondary | ICD-10-CM | POA: Diagnosis not present

## 2011-07-14 DIAGNOSIS — E559 Vitamin D deficiency, unspecified: Secondary | ICD-10-CM

## 2011-07-14 DIAGNOSIS — E785 Hyperlipidemia, unspecified: Secondary | ICD-10-CM

## 2011-07-14 LAB — CBC WITH DIFFERENTIAL/PLATELET
BASO%: 1 % (ref 0.0–2.0)
EOS%: 2.8 % (ref 0.0–7.0)
MCH: 30.8 pg (ref 25.1–34.0)
MCHC: 33.6 g/dL (ref 31.5–36.0)
MCV: 91.7 fL (ref 79.5–101.0)
MONO%: 8 % (ref 0.0–14.0)
RBC: 4.53 10*6/uL (ref 3.70–5.45)
RDW: 15.5 % — ABNORMAL HIGH (ref 11.2–14.5)
lymph#: 2.3 10*3/uL (ref 0.9–3.3)

## 2011-07-14 LAB — COMPREHENSIVE METABOLIC PANEL
BUN: 16 mg/dL (ref 6–23)
CO2: 29 mEq/L (ref 19–32)
Creatinine, Ser: 0.97 mg/dL (ref 0.50–1.10)
Glucose, Bld: 128 mg/dL — ABNORMAL HIGH (ref 70–99)
Total Bilirubin: 0.5 mg/dL (ref 0.3–1.2)

## 2011-07-14 LAB — CANCER ANTIGEN 27.29: CA 27.29: 22 U/mL (ref 0–39)

## 2011-07-17 ENCOUNTER — Ambulatory Visit (INDEPENDENT_AMBULATORY_CARE_PROVIDER_SITE_OTHER): Payer: Medicare Other | Admitting: Critical Care Medicine

## 2011-07-17 ENCOUNTER — Encounter: Payer: Self-pay | Admitting: Critical Care Medicine

## 2011-07-17 VITALS — BP 100/56 | HR 78 | Temp 97.8°F | Ht 65.0 in | Wt 169.2 lb

## 2011-07-17 DIAGNOSIS — G4733 Obstructive sleep apnea (adult) (pediatric): Secondary | ICD-10-CM

## 2011-07-17 DIAGNOSIS — J449 Chronic obstructive pulmonary disease, unspecified: Secondary | ICD-10-CM | POA: Diagnosis not present

## 2011-07-17 NOTE — Assessment & Plan Note (Signed)
Moderate chronic obstructive lung disease with ongoing tobacco use stable at this time at least gold class C. Plan Continue to advance his smoking cessation No additional pulmonary changes are recommended

## 2011-07-17 NOTE — Progress Notes (Signed)
Subjective:    Patient ID: [name], female    DOB: 08-Feb-1932, 78 y.o.   MRN: [id number]  HPI  78 y.o.   white female with history of chronic obstructive lung disease, asthmatic bronchitis, and obstructive sleep apnea. Active smoker and has failed smoking cessation on multiple attempts.   07/17/2011 Copd f/u.  Pt states dyspnea is the same.  Did have mucus when it was raining.  Using mucinex. Smokes 1PPD.  No real chest pain.  No real palpitations. Pt denies any significant sore throat, nasal congestion or excess secretions, fever, chills, sweats, unintended weight loss, pleurtic or exertional chest pain, orthopnea PND, or leg swelling Pt denies any increase in rescue therapy over baseline, denies waking up needing it or having any early am or nocturnal exacerbations of coughing/wheezing/or dyspnea. Pt also denies any obvious fluctuation in symptoms with  weather or environmental change or other alleviating or aggravating factors    Past Medical History  Diagnosis Date  . CVA (cerebral vascular accident)   . Sleep apnea     associated with hypersomnia  . Amiodarone pulmonary toxicity   . Renal disease     CHRONIC  . Diabetes   . Tobacco abuse   . Diastolic heart failure     Acute on Chronic  . Pacemaker     Permanent  . AF (atrial fibrillation)      AV ablation 9/09 [facility] per Dr Sampson Goon - AV node ablation 9/11 Dr Graciela Husbands  . Hyperlipidemia   . GERD (gastroesophageal reflux disease)   . [name] (coronary artery disease)     (not sure of this 11/10  . COPD (chronic obstructive pulmonary disease)     emphysema -FeV1 73% DLCO 53% 5/09  . PAD (peripheral artery disease)     w/hx right iliac/SFA stenting and left leg PTA  . Invasive ductal carcinoma of breast 2011    LEFT   . OA (osteoarthritis) of knee     RIGHT  . Right sided sciatica      Family History  Problem Relation Age of Onset  . Heart disease Father      History   Social History  . Marital Status:  Married    Spouse Name: N/A    Number of Children: N/A  . Years of Education: N/A   Occupational History  . Not on file.   Social History Main Topics  . Smoking status: Current Everyday Smoker -- 1.0 packs/day    Types: Cigarettes  . Smokeless tobacco: Never Used   Comment: started smoking at age 42  . Alcohol Use: Yes     wine occasionally  . Drug Use: Not on file  . Sexually Active: Not on file   Other Topics Concern  . Not on file   Social History Narrative   HS Graduate; [employer]. [employer].  Married '61.  2 sons - '70, '71; Dtrs - '64,'68; 6 grandchildren.  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 Interests -Tai-chi & water aerobics, gardening, active lifestyle.  Marriage a bit stressful - SO w/membory problems and difficult behavior. She denies any personal safety concerns. End of life care: need to address at next OV      Allergies  Allergen Reactions  . Cholestatin     RAGWEED SEASON     Outpatient Prescriptions Prior to Visit  Medication Sig Dispense Refill  .  atenolol (TENORMIN) 25 MG tablet Take once a day for essential tremor. If need be it can be advanced to twice a day.  180 tablet  3  . atorvastatin (LIPITOR) 20 MG tablet Take 1 tablet (20 mg total) by mouth daily.  90 tablet  3  . cilostazol (PLETAL) 100 MG tablet Take 100 mg by mouth 2 (two) times daily.        . Ferrous Sulfate (FE-CAPS) 250 MG CPCR Take 1 capsule by mouth daily.        . fish oil-omega-3 fatty acids 1000 MG capsule Take 1 g by mouth 2 (two) times daily.        . Fluticasone-Salmeterol (ADVAIR DISKUS) 250-50 MCG/DOSE AEPB Inhale 1 puff into the lungs 2 (two) times daily.        . furosemide (LASIX) 40 MG tablet Take by mouth. Take 2 tablets in the morning and 1 tablet at night      . Glucosamine-Chondroit-Vit C-Mn (GLUCOSAMINE 1500 COMPLEX)  CAPS Take 1 capsule by mouth 2 (two) times daily.        Marland Kitchen guaiFENesin (MUCINEX) 600 MG 12 hr tablet Take 1,200 mg by mouth 2 (two) times daily.       Marland Kitchen letrozole (FEMARA) 2.5 MG tablet TAKE 1 TABLET DAILY  90 tablet  3  . levalbuterol (XOPENEX HFA) 45 MCG/ACT inhaler Inhale 2 puffs into the lungs every 6 (six) hours as needed.        . Magnesium 250 MG TABS Take 1 tablet by mouth daily.        . metFORMIN (GLUCOPHAGE) 500 MG tablet Take 500 mg by mouth daily with breakfast.        . mometasone (NASONEX) 50 MCG/ACT nasal spray 2 sprays by Nasal route daily.  51 g  4  . NIACINAMIDE PO Take by mouth 3 (three) times daily.        Marland Kitchen tiotropium (SPIRIVA HANDIHALER) 18 MCG inhalation capsule Place 1 capsule (18 mcg total) into inhaler and inhale daily.  90 capsule  4  . vitamin E 100 UNIT capsule Twice weekly       . warfarin (COUMADIN) 5 MG tablet TAKE AS DIRECTED BY ANTICOAGULATION CLINIC  135 tablet  0  . potassium chloride SA (K-DUR,KLOR-CON) 20 MEQ tablet Take 1 tablet (20 mEq total) by mouth as directed. Take 2 tabs every am and 3 tabs every pm  150 tablet  10  . metFORMIN (GLUCOPHAGE) 500 MG tablet Take 500mg  AM for 5 days and then advance to twice a day.  60 tablet  11     Review of Systems  Constitutional:   No  weight loss, night sweats,  Fevers, chills, fatigue, lassitude. HEENT:   No headaches,  Difficulty swallowing,  Tooth/dental problems,  Sore throat,                No sneezing, itching, ear ache, nasal congestion, post nasal drip,   CV:  No chest pain,  Orthopnea, PND, swelling in lower extremities, anasarca, dizziness, palpitations  GI  No heartburn, indigestion, abdominal pain, nausea, vomiting, diarrhea, change in bowel habits, loss of appetite  Resp: Notes  shortness of breath with exertion not  at rest.  No excess mucus, notes  productive cough,  No non-productive cough,  No coughing up of blood.  No change in color of mucus.  No wheezing.  No chest wall deformity  Skin:  no rash or lesions.  GU: no dysuria, change in color  of urine, no urgency or frequency.  No flank pain.  MS:  No joint pain or swelling.  No decreased range of motion.  No back pain.  Psych:  No change in mood or affect. No depression or anxiety.  No memory loss.     Objective:   Physical Exam BP 100/56  Pulse 78  Temp(Src) 97.8 F (36.6 C) (Oral)  Ht 5\' 5"  (1.651 m)  Wt 169 lb 3.2 oz (76.749 kg)  BMI 28.16 kg/m2  SpO2 94%  Gen: Pleasant, well-nourished, in no distress,  normal affect  ENT: No lesions,  mouth clear,  oropharynx clear, no postnasal drip  Neck: No JVD, no TMG, no carotid bruits  Lungs: No use of accessory muscles, no dullness to percussion, distant bs, clear Cardiovascular: RRR, heart sounds normal, no murmur or gallops, no peripheral edema  Abdomen: soft and NT, no HSM,  BS normal  Musculoskeletal: No deformities, no cyanosis or clubbing  Neuro: alert, non focal  Skin: Warm, no lesions or rashes        Assessment & Plan:   COPD Moderate chronic obstructive lung disease with ongoing tobacco use stable at this time at least gold class C. Plan Continue to advance his smoking cessation No additional pulmonary changes are recommended     Updated Medication List Outpatient Encounter Prescriptions as of 07/17/2011  Medication Sig Dispense Refill  . atenolol (TENORMIN) 25 MG tablet Take once a day for essential tremor. If need be it can be advanced to twice a day.  180 tablet  3  . atorvastatin (LIPITOR) 20 MG tablet Take 1 tablet (20 mg total) by mouth daily.  90 tablet  3  . cilostazol (PLETAL) 100 MG tablet Take 100 mg by mouth 2 (two) times daily.        . Ferrous Sulfate (FE-CAPS) 250 MG CPCR Take 1 capsule by mouth daily.        . fish oil-omega-3 fatty acids 1000 MG capsule Take 1 g by mouth 2 (two) times daily.        . Fluticasone-Salmeterol (ADVAIR DISKUS) 250-50 MCG/DOSE AEPB Inhale 1 puff into the lungs 2 (two) times daily.        .  furosemide (LASIX) 40 MG tablet Take by mouth. Take 2 tablets in the morning and 1 tablet at night      . Glucosamine-Chondroit-Vit C-Mn (GLUCOSAMINE 1500 COMPLEX) CAPS Take 1 capsule by mouth 2 (two) times daily.        Marland Kitchen guaiFENesin (MUCINEX) 600 MG 12 hr tablet Take 1,200 mg by mouth 2 (two) times daily.       Marland Kitchen letrozole (FEMARA) 2.5 MG tablet TAKE 1 TABLET DAILY  90 tablet  3  . levalbuterol (XOPENEX HFA) 45 MCG/ACT inhaler Inhale 2 puffs into the lungs every 6 (six) hours as needed.        . Magnesium 250 MG TABS Take 1 tablet by mouth daily.        . metFORMIN (GLUCOPHAGE) 500 MG tablet Take 500 mg by mouth daily with breakfast.        . mometasone (NASONEX) 50 MCG/ACT nasal spray 2 sprays by Nasal route daily.  51 g  4  . NIACINAMIDE PO Take by mouth 3 (three) times daily.        . potassium chloride SA (K-DUR,KLOR-CON) 20 MEQ tablet Take 2 tabs every am and 3 tabs every pm      . tiotropium (SPIRIVA HANDIHALER) 18 MCG inhalation capsule Place  1 capsule (18 mcg total) into inhaler and inhale daily.  90 capsule  4  . vitamin E 100 UNIT capsule Twice weekly       . warfarin (COUMADIN) 5 MG tablet TAKE AS DIRECTED BY ANTICOAGULATION CLINIC  135 tablet  0  . DISCONTD: potassium chloride SA (K-DUR,KLOR-CON) 20 MEQ tablet Take 1 tablet (20 mEq total) by mouth as directed. Take 2 tabs every am and 3 tabs every pm  150 tablet  10  . metFORMIN (GLUCOPHAGE) 500 MG tablet Take 500mg  AM for 5 days and then advance to twice a day.  60 tablet  11

## 2011-07-17 NOTE — Patient Instructions (Signed)
No change in medications. Return in          6 months Will try to replace cpap with [facility] Stay on oxygen at night

## 2011-07-20 ENCOUNTER — Ambulatory Visit (HOSPITAL_BASED_OUTPATIENT_CLINIC_OR_DEPARTMENT_OTHER): Payer: Medicare Other | Admitting: Oncology

## 2011-07-20 ENCOUNTER — Telehealth: Payer: Self-pay | Admitting: *Deleted

## 2011-07-20 VITALS — BP 94/58 | HR 70 | Temp 97.5°F | Ht 65.0 in | Wt 168.2 lb

## 2011-07-20 DIAGNOSIS — C50419 Malignant neoplasm of upper-outer quadrant of unspecified female breast: Secondary | ICD-10-CM | POA: Diagnosis not present

## 2011-07-20 DIAGNOSIS — Z170 Estrogen receptor positive status [ER+]: Secondary | ICD-10-CM

## 2011-07-20 DIAGNOSIS — J449 Chronic obstructive pulmonary disease, unspecified: Secondary | ICD-10-CM | POA: Diagnosis not present

## 2011-07-20 DIAGNOSIS — M949 Disorder of cartilage, unspecified: Secondary | ICD-10-CM | POA: Diagnosis not present

## 2011-07-20 DIAGNOSIS — C50919 Malignant neoplasm of unspecified site of unspecified female breast: Secondary | ICD-10-CM

## 2011-07-20 DIAGNOSIS — M899 Disorder of bone, unspecified: Secondary | ICD-10-CM

## 2011-07-20 MED ORDER — LETROZOLE 2.5 MG PO TABS: 2.5000 mg | ORAL_TABLET | Freq: Every day | ORAL | Status: DC

## 2011-07-20 NOTE — Progress Notes (Signed)
ID: [name]   DOB: 08/22/1931  MR#: [id number]  WUJ#:[id number]  HISTORY OF PRESENT ILLNESS: [name] had screening mammography September 29th 2011 which showed possible masses in both breasts (the prior mammogram that I have is from October of 2008).  She was recalled for further views on October 11th.  In the right breast, there was an oval circumscribed nodule in the anterior lower outer quadrant which by ultrasound was felt possibly to represent either an intraductal lesion or debris in a dilated cyst.  In the left breast, Dr. Deboraha Sprang found a small spiculated mass at 2 o'clock in the subareolar region which by ultrasound was hypoechoic and measured 1 cm.  The left axilla showed normal axillary lymph node anatomy.  With this information, after appropriate discussion on the same day, Dr. Deboraha Sprang proceeded to aspiration of the right breast cyst which histologically proved to be benign adipose tissue.  Biopsy of the left breast, however ([id number]) showed an invasive ductal carcinoma which was 100% ER positive, 0% PR positive with a low proliferation marker at 7% and no Her-2 amplification with a ratio by CISH of 1.21.    With this information, the patient was referred to Dr. Abbey Chatters.  Bilateral breast MRIs could not be obtained because the patient has a pacemaker in place.  So she proceeded to needle localization lumpectomy of the left breast mass February 03, 2010.  The pathology showed ([id number]) a 1.2 cm invasive ductal carcinoma, grade 2, with negative margins and only focal lymphovascular invasion.  Her-2 was repeated on this tissue and was again negative.  Her subsequent history is as detailed below.  INTERVAL HISTORY: The patient returns today for routine breast cancer followup. The interval history is unremarkable. She continues to do a lot of gardening, recently went to the beach of with her son and his wife and has 2 children, as well as the patient's husband. She regrets  having had to give her a dog up because her husband could no longer of care for it.  REVIEW OF SYSTEMS: Medical he she is "about the same". Her biggest problem remains COPD. We and her pulmonary doctors have repeatedly counseled her regarding smoking cessation. She describes herself as mildly fatigued. She was started on metformin and has one of "explosive" bowel movement daily. She has some bladder control issues, which are not new. Otherwise a detailed review of systems was stable  PAST MEDICAL HISTORY: Past Medical History  Diagnosis Date  . CVA (cerebral vascular accident)   . Sleep apnea     associated with hypersomnia  . Amiodarone pulmonary toxicity   . Renal disease     CHRONIC  . Diabetes   . Tobacco abuse   . Diastolic heart failure     Acute on Chronic  . Pacemaker     Permanent  . AF (atrial fibrillation)      AV ablation 9/09 [facility] per Dr Sampson Goon - AV node ablation 9/11 Dr Graciela Husbands  . Hyperlipidemia   . GERD (gastroesophageal reflux disease)   . [name] (coronary artery disease)     (not sure of this 11/10  . COPD (chronic obstructive pulmonary disease)     emphysema -FeV1 73% DLCO 53% 5/09  . PAD (peripheral artery disease)     w/hx right iliac/SFA stenting and left leg PTA  . Invasive ductal carcinoma of breast 2011    LEFT   . OA (osteoarthritis) of knee     RIGHT  . Right sided sciatica  PAST SURGICAL HISTORY: Past Surgical History  Procedure Date  . Mastectomy, partial 02/03/2010    Left/Dr Rosenbower  . Cataract extraction, bilateral     with IOL/Dr Groat  . Breast lumpectomy     left breast    FAMILY HISTORY Family History  Problem Relation Age of Onset  . Heart disease Father   The patient's father died from myocardial infarction at the age of 76.  The patient's mother died from unclear causes in the setting of COPD and ETOH abuse at the age of 8.  The patient had two sisters.  There is no history of breast or ovarian cancer in the family.    GYNECOLOGIC HISTORY: She is GX P4.  First pregnancy to term she believes was age 59.  The patient underwent the change of life in her late thirties.  She never took hormone therapy. She does not recall her age at menarche.    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  Her husband, [name], was in industrial supplies.  Her son, [name], is completing a PhD in Psychology . Daughter [name] teaches kindergarten in the [place] area.  Daughter [name] is currently working for a Education administrator at [employer].  Son [name] lives in [place] and works at [employer]. The patient has 6 grandchildren, no great grandchildren.  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     ADVANCED DIRECTIVES: in place  HEALTH MAINTENANCE: History  Substance Use Topics  . Smoking status: Current Everyday Smoker -- 1.0 packs/day    Types: Cigarettes  . Smokeless tobacco: Never Used   Comment: started smoking at age 71  . Alcohol Use: Yes     wine occasionally     Colonoscopy: due  PAP:  Bone density: T - 1.17 Mar 2010  Lipid panel:  Allergies  Allergen Reactions  . Cholestatin     RAGWEED SEASON    Current Outpatient Prescriptions  Medication Sig Dispense Refill  . atenolol (TENORMIN) 25 MG tablet Take once a day for essential tremor. If need be it can be advanced to twice a day.  180 tablet  3  . atorvastatin (LIPITOR) 20 MG tablet Take 1 tablet (20 mg total) by mouth daily.  90 tablet  3  . cilostazol (PLETAL) 100 MG tablet Take 100 mg by mouth 2 (two) times daily.        . Ferrous Sulfate (FE-CAPS) 250 MG CPCR Take 1 capsule by mouth daily.        . Fluticasone-Salmeterol (ADVAIR DISKUS) 250-50 MCG/DOSE AEPB Inhale 1 puff into the lungs 2 (two) times daily.        . furosemide (LASIX) 40 MG tablet Take by mouth. Take 2 tablets in the morning and 1 tablet at night      . Glucosamine-Chondroit-Vit C-Mn (GLUCOSAMINE 1500  COMPLEX) CAPS Take 1 capsule by mouth 2 (two) times daily.        Marland Kitchen guaiFENesin (MUCINEX) 600 MG 12 hr tablet Take 1,200 mg by mouth 2 (two) times daily.       Marland Kitchen letrozole (FEMARA) 2.5 MG tablet TAKE 1 TABLET DAILY  90 tablet  3  . levalbuterol (XOPENEX HFA) 45 MCG/ACT inhaler Inhale 2 puffs into the lungs every 6 (six) hours as needed.        . Magnesium 250 MG TABS Take 1 tablet by mouth daily.        Marland Kitchen  metFORMIN (GLUCOPHAGE) 500 MG tablet Take 500 mg by mouth daily with breakfast.        . mometasone (NASONEX) 50 MCG/ACT nasal spray 2 sprays by Nasal route daily.  51 g  4  . NIACINAMIDE PO Take by mouth 3 (three) times daily.        . potassium chloride SA (K-DUR,KLOR-CON) 20 MEQ tablet Take 2 tabs every am and 3 tabs every pm      . tiotropium (SPIRIVA HANDIHALER) 18 MCG inhalation capsule Place 1 capsule (18 mcg total) into inhaler and inhale daily.  90 capsule  4  . vitamin E 100 UNIT capsule Twice weekly       . warfarin (COUMADIN) 5 MG tablet TAKE AS DIRECTED BY ANTICOAGULATION CLINIC  135 tablet  0  . fish oil-omega-3 fatty acids 1000 MG capsule Take 1 g by mouth 2 (two) times daily.        . metFORMIN (GLUCOPHAGE) 500 MG tablet Take 500mg  AM for 5 days and then advance to twice a day.  60 tablet  11    OBJECTIVE: Elderly white woman in no acute distress Filed Vitals:   07/20/11 1356  BP: 94/58  Pulse: 70  Temp: 97.5 F (36.4 C)     Body mass index is 27.99 kg/(m^2).    ECOG FS: 2  Sclerae unicteric Oropharynx clear No peripheral adenopathy Lungs no rales or rhonchi, no rubs appreciated Heart regular rate and rhythm Abd benign MSK no focal spinal tenderness, no peripheral edema Neuro: nonfocal Breasts: The right breast is unremarkable. The left breast is status post lumpectomy. There is no evidence of local recurrence.  LAB RESULTS: Lab Results  Component Value Date   WBC 8.1 07/14/2011   NEUTROABS 4.8 07/14/2011   HGB 14.0 07/14/2011   HCT 41.6 07/14/2011   MCV 91.7  07/14/2011   PLT 187 07/14/2011      Chemistry      Component Value Date/Time   NA 142 07/14/2011 0927   K 4.3 07/14/2011 0927   CL 106 07/14/2011 0927   CO2 29 07/14/2011 0927   BUN 16 07/14/2011 0927   BUN 18 07/14/2010 0922   CREATININE 0.97 07/14/2011 0927   CREATININE 1.0 07/14/2010 0922      Component Value Date/Time   CALCIUM 9.6 07/14/2011 0927   ALKPHOS 56 07/14/2011 0927   AST 26 07/14/2011 0927   ALT 16 07/14/2011 0927   BILITOT 0.5 07/14/2011 0927       Lab Results  Component Value Date   LABCA2 22 07/14/2011    No components found with this basename: [id number]    No results found for this basename: INR:1;PROTIME:1 in the last 168 hours  Urinalysis    Component Value Date/Time   COLORURINE YELLOW 04/30/2008 0128   APPEARANCEUR CLOUDY* 04/30/2008 0128   LABSPEC 1.020 04/30/2008 0128   PHURINE 5.5 04/30/2008 0128   GLUCOSEU NEGATIVE 04/30/2008 0128   HGBUR NEGATIVE 04/30/2008 0128   BILIRUBINUR NEGATIVE 04/30/2008 0128   KETONESUR NEGATIVE 04/30/2008 0128   PROTEINUR NEGATIVE 04/30/2008 0128   UROBILINOGEN 0.2 04/30/2008 0128   NITRITE NEGATIVE 04/30/2008 0128   LEUKOCYTESUR NEGATIVE MICROSCOPIC NOT DONE ON URINES WITH NEGATIVE PROTEIN, BLOOD, LEUKOCYTES, NITRITE, OR GLUCOSE <1000 mg/dL. 04/30/2008 0128    STUDIES: No new results found. Next mammography is due October 2013  ASSESSMENT: 78 year old [place] woman   (1) status post left lumpectomy November 2011 for a pT1c cN0, stage I invasive ductal carcinoma, grade 2, strongly estrogen  receptor positive, with a low proliferation fraction at 7%, progesterone receptor and HER-2 negative, on letrozole since December 2011 with excellent tolerance  (2) bone density obtained April 05, 2010, showing osteopenia at the left femoral neck with a T-score of -1.9.   PLAN: Flossie is doing well from a breast cancer point of view. Her chief problem remains her lungs. She is working with Dr. Sherene Sires regarding this. She is going to return to see me in one  year. The plan is to continue letrozole through December of 2016. She knows to call for any problems that may develop before the next visit.   Cherica Heiden C    07/20/2011

## 2011-07-20 NOTE — Telephone Encounter (Signed)
gave patient appointment for 2014 printed out calendar and gave to the patient 

## 2011-07-21 ENCOUNTER — Other Ambulatory Visit: Payer: Self-pay | Admitting: Cardiovascular Disease

## 2011-07-22 ENCOUNTER — Encounter: Payer: Self-pay | Admitting: *Deleted

## 2011-07-22 ENCOUNTER — Other Ambulatory Visit: Payer: Self-pay | Admitting: *Deleted

## 2011-07-22 NOTE — Progress Notes (Unsigned)
LETROZOLE RX NOT ESCRIBABLE. RX HAS BEEN CALLED TO [facility].

## 2011-07-24 ENCOUNTER — Ambulatory Visit (INDEPENDENT_AMBULATORY_CARE_PROVIDER_SITE_OTHER): Payer: Medicare Other

## 2011-07-24 DIAGNOSIS — B079 Viral wart, unspecified: Secondary | ICD-10-CM | POA: Diagnosis not present

## 2011-07-24 DIAGNOSIS — I6350 Cerebral infarction due to unspecified occlusion or stenosis of unspecified cerebral artery: Secondary | ICD-10-CM

## 2011-07-24 DIAGNOSIS — L570 Actinic keratosis: Secondary | ICD-10-CM | POA: Diagnosis not present

## 2011-07-24 DIAGNOSIS — I4891 Unspecified atrial fibrillation: Secondary | ICD-10-CM

## 2011-07-24 LAB — POCT INR: INR: 3.1

## 2011-07-30 ENCOUNTER — Other Ambulatory Visit: Payer: Self-pay | Admitting: Cardiovascular Disease

## 2011-08-10 ENCOUNTER — Encounter (INDEPENDENT_AMBULATORY_CARE_PROVIDER_SITE_OTHER): Payer: Self-pay | Admitting: General Surgery

## 2011-08-10 ENCOUNTER — Other Ambulatory Visit (HOSPITAL_COMMUNITY): Payer: Self-pay | Admitting: Gastroenterology

## 2011-08-10 DIAGNOSIS — Z86010 Personal history of colonic polyps: Secondary | ICD-10-CM

## 2011-08-11 ENCOUNTER — Other Ambulatory Visit: Payer: Self-pay | Admitting: Internal Medicine

## 2011-08-11 ENCOUNTER — Other Ambulatory Visit: Payer: Self-pay | Admitting: Critical Care Medicine

## 2011-08-11 DIAGNOSIS — I495 Sick sinus syndrome: Secondary | ICD-10-CM

## 2011-08-21 ENCOUNTER — Ambulatory Visit (INDEPENDENT_AMBULATORY_CARE_PROVIDER_SITE_OTHER): Payer: Medicare Other

## 2011-08-21 DIAGNOSIS — I4891 Unspecified atrial fibrillation: Secondary | ICD-10-CM | POA: Diagnosis not present

## 2011-08-21 DIAGNOSIS — I6350 Cerebral infarction due to unspecified occlusion or stenosis of unspecified cerebral artery: Secondary | ICD-10-CM | POA: Diagnosis not present

## 2011-08-21 LAB — POCT INR: INR: 3

## 2011-08-26 ENCOUNTER — Encounter: Payer: Self-pay | Admitting: Internal Medicine

## 2011-08-26 ENCOUNTER — Ambulatory Visit (INDEPENDENT_AMBULATORY_CARE_PROVIDER_SITE_OTHER): Payer: Medicare Other | Admitting: Internal Medicine

## 2011-08-26 VITALS — BP 92/60 | HR 80 | Temp 97.6°F | Resp 16 | Ht 65.0 in | Wt 165.0 lb

## 2011-08-26 DIAGNOSIS — E785 Hyperlipidemia, unspecified: Secondary | ICD-10-CM | POA: Diagnosis not present

## 2011-08-26 DIAGNOSIS — H6120 Impacted cerumen, unspecified ear: Secondary | ICD-10-CM

## 2011-08-26 DIAGNOSIS — Z0000 Encounter for general adult medical examination without abnormal findings: Secondary | ICD-10-CM | POA: Diagnosis not present

## 2011-08-26 DIAGNOSIS — E119 Type 2 diabetes mellitus without complications: Secondary | ICD-10-CM

## 2011-08-26 MED ORDER — BD MICROTAINER LANCETS MISC: 1.0000 | Freq: Every day | Status: DC

## 2011-08-26 NOTE — Patient Instructions (Addendum)
Colon cancer screening - you don't present symptoms that make me worry about colon cancer. If you had a normal study last time you can wait a full 10 years. Furthermore, if you do not see yourself accepting surgery or chemo for any abnormal finding it doesn't make any sense to screen. Cancel the barium enema  Earwax - it is gone.  Diabetes - will check labs today for diabetes and cholesterol.  Care-giver - look into alzheimer's support groups: it is very helpful to have others dealing with the same problems and they work out time out arrangements which may be helpful.  You will receive a letter with your lab results.

## 2011-08-26 NOTE — Progress Notes (Signed)
Subjective:    Patient ID: [name], female    DOB: 06-Jan-1932, 78 y.o.   MRN: [id number]  HPI [name] has several issues: 1. She was seen at Dr. Randa Evens office for follow-up. She was told she did not need colonoscopy. Long discussion about the rationale of screening. 2. She was told that she had a cerumen impaction at a church screening. 3. Diabetes - her CBGs are in the 100's taking metformin 500 mg once a day.  Past Medical History  Diagnosis Date  . CVA (cerebral vascular accident)   . Sleep apnea     associated with hypersomnia  . Amiodarone pulmonary toxicity   . Renal disease     CHRONIC  . Diabetes   . Tobacco abuse   . Diastolic heart failure     Acute on Chronic  . Pacemaker     Permanent  . AF (atrial fibrillation)      AV ablation 9/09 [facility] per Dr Sampson Goon - AV node ablation 9/11 Dr Graciela Husbands  . Hyperlipidemia   . GERD (gastroesophageal reflux disease)   . [name] (coronary artery disease)     (not sure of this 11/10  . COPD (chronic obstructive pulmonary disease)     emphysema -FeV1 73% DLCO 53% 5/09  . PAD (peripheral artery disease)     w/hx right iliac/SFA stenting and left leg PTA  . Invasive ductal carcinoma of breast 2011    LEFT   . OA (osteoarthritis) of knee     RIGHT  . Right sided sciatica    Past Surgical History  Procedure Date  . Mastectomy, partial 02/03/2010    Left/Dr Rosenbower  . Cataract extraction, bilateral     with IOL/Dr Groat  . Breast lumpectomy     left breast   Family History  Problem Relation Age of Onset  . Heart disease Father    History   Social History  . Marital Status: Married    Spouse Name: N/A    Number of Children: N/A  . Years of Education: N/A   Occupational History  . Not on file.   Social History Main Topics  . Smoking status: Current Everyday Smoker -- 1.0 packs/day    Types: Cigarettes  . Smokeless tobacco: Never Used   Comment: started smoking at age 89  . Alcohol Use: Yes     wine  occasionally  . Drug Use: Not on file  . Sexually Active: Not on file   Other Topics Concern  . Not on file   Social History Narrative   HS Graduate; [employer]. [employer].  Married '61.  2 sons - '70, '71; Dtrs - '64,'68; 6 grandchildren.  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 Interests -Tai-chi & water aerobics, gardening, active lifestyle.  Marriage a bit stressful - SO w/membory problems and difficult behavior. She denies any personal safety concerns. End of life care: need to address at next OV      Review of Systems System review is negative for any constitutional, cardiac, pulmonary, GI or neuro symptoms or complaints other than as described in the HPI.     Objective:   Physical Exam Filed Vitals:   08/26/11 1026  BP: 92/60  Pulse: 80  Temp: 97.6 F (36.4 C)  Resp: 16   Gen'l- pleasant elderly white woman in no distress HEENT- cerumen impaction right ear  Cor- RRR Pulm - normal respirations.    Procedure Note :     Procedure :  Ear irrigation   Indication:  Cerumen impaction-right   Risks, including pain, dizziness, eardrum perforation, bleeding, infection and others as well as benefits were explained to the patient in detail. Verbal consent was obtained and the patient agreed to proceed.    We used irrigation syringe with lukewarm water for irrigation. A large amount wax was recovered.    Tolerated well. Complications: None.   Postprocedure instructions :  Call if problems.         Assessment & Plan:  Cerumen impaction - cleared w/o difficulty

## 2011-08-27 ENCOUNTER — Other Ambulatory Visit (INDEPENDENT_AMBULATORY_CARE_PROVIDER_SITE_OTHER): Payer: Medicare Other

## 2011-08-27 DIAGNOSIS — Z7189 Other specified counseling: Secondary | ICD-10-CM | POA: Insufficient documentation

## 2011-08-27 DIAGNOSIS — E785 Hyperlipidemia, unspecified: Secondary | ICD-10-CM

## 2011-08-27 DIAGNOSIS — E119 Type 2 diabetes mellitus without complications: Secondary | ICD-10-CM | POA: Diagnosis not present

## 2011-08-27 LAB — LIPID PANEL
Cholesterol: 129 mg/dL (ref 0–200)
VLDL: 24.6 mg/dL (ref 0.0–40.0)

## 2011-08-27 NOTE — Assessment & Plan Note (Signed)
Discussed colon cancer screening in detail. She had a normal exam within the last 10 years. She is 32 with multiple co-morbidities. She clearly states if she were to have any abnormality on repeat colonoscopy she would not want any surgery or chemotherapy.Thus, she is advised to not have further colon screening, either endoscopy or imaging studies.

## 2011-08-28 ENCOUNTER — Other Ambulatory Visit: Payer: Self-pay | Admitting: Internal Medicine

## 2011-08-28 ENCOUNTER — Other Ambulatory Visit: Payer: Self-pay | Admitting: *Deleted

## 2011-08-28 MED ORDER — ATORVASTATIN CALCIUM 20 MG PO TABS: 20.0000 mg | ORAL_TABLET | Freq: Every day | ORAL | Status: DC

## 2011-08-30 ENCOUNTER — Encounter: Payer: Self-pay | Admitting: Internal Medicine

## 2011-09-08 ENCOUNTER — Other Ambulatory Visit (HOSPITAL_COMMUNITY): Payer: Self-pay | Admitting: *Deleted

## 2011-09-08 ENCOUNTER — Other Ambulatory Visit: Payer: Self-pay | Admitting: Internal Medicine

## 2011-09-08 NOTE — Telephone Encounter (Signed)
It looks like her lasix was filled 5/23 with no refills. She will run out in August. Her warfarin was filled after that. Please call her and see if she ever received these refills. I'm not sure why she would need them now. Verify where she wants them from. Thank you

## 2011-09-08 NOTE — Telephone Encounter (Signed)
Pt needs refill asap she is out of meds and needs this called in

## 2011-09-09 ENCOUNTER — Telehealth: Payer: Self-pay | Admitting: *Deleted

## 2011-09-09 NOTE — Telephone Encounter (Signed)
Called patient to verify if she ever received medications that were ordered 6 weeks ago. She is not sure because she has not been home. But she says she received a letter saying these medications could not be filled but did not specify from whom the letter came from. She will check her medication boxes and call [facility] back to say if she received her refills or not. If she does not have these refills the warfarin will be sent to [facility] for refill verification and I will refill her Lasix.  Linette Gunderson, CMA

## 2011-09-21 ENCOUNTER — Ambulatory Visit (INDEPENDENT_AMBULATORY_CARE_PROVIDER_SITE_OTHER): Payer: Medicare Other | Admitting: *Deleted

## 2011-09-21 DIAGNOSIS — I4891 Unspecified atrial fibrillation: Secondary | ICD-10-CM

## 2011-09-21 DIAGNOSIS — I6350 Cerebral infarction due to unspecified occlusion or stenosis of unspecified cerebral artery: Secondary | ICD-10-CM

## 2011-09-25 ENCOUNTER — Other Ambulatory Visit (HOSPITAL_COMMUNITY): Payer: Medicare Other

## 2011-09-28 ENCOUNTER — Encounter: Payer: Self-pay | Admitting: Cardiovascular Disease

## 2011-09-28 ENCOUNTER — Ambulatory Visit (INDEPENDENT_AMBULATORY_CARE_PROVIDER_SITE_OTHER): Payer: Medicare Other | Admitting: General Surgery

## 2011-09-28 ENCOUNTER — Encounter (INDEPENDENT_AMBULATORY_CARE_PROVIDER_SITE_OTHER): Payer: Self-pay | Admitting: General Surgery

## 2011-09-28 ENCOUNTER — Ambulatory Visit (INDEPENDENT_AMBULATORY_CARE_PROVIDER_SITE_OTHER): Payer: Medicare Other | Admitting: Cardiovascular Disease

## 2011-09-28 VITALS — BP 100/62 | HR 76 | Temp 98.3°F | Resp 12 | Ht 65.0 in | Wt 165.8 lb

## 2011-09-28 VITALS — BP 83/53 | HR 69 | Ht 65.0 in | Wt 164.0 lb

## 2011-09-28 DIAGNOSIS — I2510 Atherosclerotic heart disease of native coronary artery without angina pectoris: Secondary | ICD-10-CM

## 2011-09-28 DIAGNOSIS — I739 Peripheral vascular disease, unspecified: Secondary | ICD-10-CM | POA: Diagnosis not present

## 2011-09-28 DIAGNOSIS — Z853 Personal history of malignant neoplasm of breast: Secondary | ICD-10-CM

## 2011-09-28 NOTE — Patient Instructions (Signed)
Your mammogram is due in October of this year. Call if you notice any new lumps in your breasts.

## 2011-09-28 NOTE — Patient Instructions (Signed)
Your physician wants you to follow-up in: 12 months You will receive a reminder letter in the mail two months in advance. If you don't receive a letter, please call our office to schedule the follow-up appointment.  Your physician has requested that you have an ankle brachial index (ABI). During this test an ultrasound and blood pressure cuff are used to evaluate the arteries that supply the arms and legs with blood. Allow thirty minutes for this exam. There are no restrictions or special instructions.  ABI's in 12 months

## 2011-09-28 NOTE — Progress Notes (Signed)
HPI:  [name] presents for followup evaluation. She is a 78 year old woman followed for lower extremity PAD. She is also seen by Dr. Graciela [name] and she has undergone previous flutter ablations and ultimately was treated with AV node ablation and permanent pacing.  The patient has undergone remote tibial angioplasty procedures and right iliac and SFA stenting. She has done well in has minimal leg pain at present. She does have some pain in her right knee and lower leg but this is not consistently related to ambulation. Her last ABIs were 1 year ago and they were 0.98 on the right and 0.85 on the left. Doppler studies showed patency of her lower extremity stents.  The patient denies chest pain or dyspnea with exertion. She still does some gardening but otherwise has been less active. She has no symptoms with exertion.  Outpatient Encounter Prescriptions as of 09/28/2011  Medication Sig Dispense Refill  . ADVAIR DISKUS 250-50 MCG/DOSE AEPB INHALE 1 PUFF TWICE DAILY  180 each  5  . atenolol (TENORMIN) 25 MG tablet Take once a day for essential tremor. If need be it can be advanced to twice a day.  180 tablet  3  . atorvastatin (LIPITOR) 20 MG tablet Take 1 tablet (20 mg total) by mouth daily.  90 tablet  3  . BD Microtainer Lancets MISC 1 Device by Subdermal route daily. Use as directed one time daily  100 each  5  . cilostazol (PLETAL) 100 MG tablet TAKE 1 TABLET TWICE DAILY  180 tablet  2  . Ferrous Sulfate (FE-CAPS) 250 MG CPCR Take 1 capsule by mouth daily.        . furosemide (LASIX) 40 MG tablet       . Glucosamine-Chondroit-Vit C-Mn (GLUCOSAMINE 1500 COMPLEX) CAPS Take 1 capsule by mouth 2 (two) times daily.        Marland Kitchen guaiFENesin (MUCINEX) 600 MG 12 hr tablet Take 1,200 mg by mouth 2 (two) times daily.       Marland Kitchen letrozole (FEMARA) 2.5 MG tablet Take 1 tablet (2.5 mg total) by mouth daily.  90 tablet  12  . levalbuterol (XOPENEX HFA) 45 MCG/ACT inhaler Inhale 2 puffs into the lungs every 6 (six)  hours as needed.        . Magnesium 250 MG TABS Take 1 tablet by mouth daily.        . metFORMIN (GLUCOPHAGE) 500 MG tablet Take 500 mg by mouth daily with breakfast.       . mometasone (NASONEX) 50 MCG/ACT nasal spray 2 sprays by Nasal route daily.  51 g  4  . NIACINAMIDE PO Take by mouth 3 (three) times daily.        . potassium chloride SA (K-DUR,KLOR-CON) 20 MEQ tablet TAKE 2 TABLETS EVERY MORNING AND 3 TABLETS EVERY EVENING  450 tablet  0  . tiotropium (SPIRIVA HANDIHALER) 18 MCG inhalation capsule Place 1 capsule (18 mcg total) into inhaler and inhale daily.  90 capsule  4  . vitamin E 100 UNIT capsule Twice weekly       . warfarin (COUMADIN) 5 MG tablet TAKE AS DIRECTED BY ANTICOAGULATION CLINIC  135 tablet  0  . DISCONTD: furosemide (LASIX) 40 MG tablet TAKE 2 TABLETS TWICE DAILY  360 tablet  0  . metFORMIN (GLUCOPHAGE) 500 MG tablet Take 500mg  AM for 5 days and then advance to twice a day.  60 tablet  11  . DISCONTD: fish oil-omega-3 fatty acids 1000 MG capsule Take  1 g by mouth 2 (two) times daily.          Allergies  Allergen Reactions  . Cholestatin     RAGWEED SEASON    Past Medical History  Diagnosis Date  . CVA (cerebral vascular accident)   . Sleep apnea     associated with hypersomnia  . Amiodarone pulmonary toxicity   . Renal disease     CHRONIC  . Diabetes   . Tobacco abuse   . Diastolic heart failure     Acute on Chronic  . Pacemaker     Permanent  . AF (atrial fibrillation)      AV ablation 9/09 [facility] per Dr Sampson Goon - AV node ablation 9/11 Dr [name]  . Hyperlipidemia   . GERD (gastroesophageal reflux disease)   . [name] (coronary artery disease)     (not sure of this 11/10  . COPD (chronic obstructive pulmonary disease)     emphysema -FeV1 73% DLCO 53% 5/09  . PAD (peripheral artery disease)     w/hx right iliac/SFA stenting and left leg PTA  . Invasive ductal carcinoma of breast 2011    LEFT   . OA (osteoarthritis) of knee     RIGHT  . Right sided  sciatica     ROS: Negative except as per HPI  BP 83/53  Pulse 69  Ht 5\' 5"  (1.651 m)  Wt 74.39 kg (164 lb)  BMI 27.29 kg/m2  PHYSICAL EXAM: Pt is alert and oriented, NAD HEENT: normal Neck: JVP - normal, carotids 2+= without bruits Lungs: CTA bilaterally CV: RRR without murmur or gallop Abd: soft, NT, Positive BS, no hepatomegaly Ext: no C/C/E, distal pulses intact and equal Skin: warm/dry no rash  EKG:  Atrial flutter with ventricular pacing at 70 beats per minute.  ASSESSMENT AND PLAN:

## 2011-09-28 NOTE — Progress Notes (Signed)
Operation:  Left partial mastectomy  Date:  January 26, 2010  Stage:  T1cNx  Hormone receptor status:  Estrogen positive  HPI:  [name] is here for followup of her left breast cancer.  She denies any masses in her breasts.  No adenopathy.  Her mammogram is due to be done this October.  She is tolerating the Femara.  PE:  Gen- WDWN in nad.  Right breast- no palpable masses, suspicious contrast, nipple discharge  Left breast-well-healed incision at 3:00 position with some indentation; no masses noted.   Nodes-no palpable cervical, supraclavicular, or axillary adenopathy.  Assessment:  Left breast cancer-no clinical evidence of recurrence.  Plan:  Return visit 5 months.

## 2011-09-28 NOTE — Assessment & Plan Note (Signed)
The patient remained stable on a combination of Pletal. She is not on other antiplatelet therapy because of long-term warfarin. Will repeat ABIs when she returns next year for followup. I don't see any physical exam evidence of progressive vascular disease. She understands that continued tobacco use is detrimental to her overall health as well as his specific impact related to PAD. She will not quit smoking.

## 2011-10-14 ENCOUNTER — Other Ambulatory Visit: Payer: Self-pay | Admitting: Internal Medicine

## 2011-10-14 DIAGNOSIS — Z85828 Personal history of other malignant neoplasm of skin: Secondary | ICD-10-CM | POA: Diagnosis not present

## 2011-10-14 DIAGNOSIS — L821 Other seborrheic keratosis: Secondary | ICD-10-CM | POA: Diagnosis not present

## 2011-10-14 DIAGNOSIS — L570 Actinic keratosis: Secondary | ICD-10-CM | POA: Diagnosis not present

## 2011-10-15 NOTE — Telephone Encounter (Signed)
90 day supply

## 2011-10-19 ENCOUNTER — Telehealth: Payer: Self-pay | Admitting: Internal Medicine

## 2011-10-19 NOTE — Telephone Encounter (Signed)
New msg Pt has pacemaker and wants to know what she can use in her house that wont interfere with pacemaker. Please call

## 2011-10-21 NOTE — Telephone Encounter (Signed)
Spoke w/pt's husband---pt out of town until next Tuesday. Advised to let pt know returned call and if still has questions to return the call.

## 2011-10-28 ENCOUNTER — Ambulatory Visit (INDEPENDENT_AMBULATORY_CARE_PROVIDER_SITE_OTHER): Payer: Medicare Other | Admitting: *Deleted

## 2011-10-28 DIAGNOSIS — I6350 Cerebral infarction due to unspecified occlusion or stenosis of unspecified cerebral artery: Secondary | ICD-10-CM

## 2011-10-28 DIAGNOSIS — I4891 Unspecified atrial fibrillation: Secondary | ICD-10-CM | POA: Diagnosis not present

## 2011-10-28 LAB — POCT INR: INR: 3.2

## 2011-11-03 ENCOUNTER — Other Ambulatory Visit: Payer: Self-pay | Admitting: Critical Care Medicine

## 2011-11-03 DIAGNOSIS — J449 Chronic obstructive pulmonary disease, unspecified: Secondary | ICD-10-CM

## 2011-11-03 MED ORDER — TIOTROPIUM BROMIDE MONOHYDRATE 18 MCG IN CAPS: 18.0000 ug | ORAL_CAPSULE | Freq: Every day | RESPIRATORY_TRACT | Status: DC

## 2011-11-03 NOTE — Telephone Encounter (Signed)
[facility] REQUESTING RX FOR  SPIRIVA <> PLACE 1 CAPSULE INTO THE HANDIDALER AND INHALE ONCE A DAY  #90 x4

## 2011-11-10 ENCOUNTER — Telehealth: Payer: Self-pay | Admitting: Internal Medicine

## 2011-11-10 DIAGNOSIS — I495 Sick sinus syndrome: Secondary | ICD-10-CM | POA: Diagnosis not present

## 2011-11-10 DIAGNOSIS — I498 Other specified cardiac arrhythmias: Secondary | ICD-10-CM

## 2011-11-10 NOTE — Telephone Encounter (Signed)
Pt calling ionic max air purifier and electronic ultrasonic pest repeller, can she take por q 10? [phone]

## 2011-11-10 NOTE — Telephone Encounter (Signed)
Patient called stated she wants to know if she can take Co Q 10 with resveratrol,and if ok to use in her home ionic max air purifier,and a electronic ultrasonic pest repeller.Patient wants to know why Dr.Klein stopped her aspirin.Pateint was told because she is on coumadin.Patient stated she has always taken a low dose aspirin and wants to know why from Dr.Klein.Patient was told Dr.Klein not in office today will forward message to Dr.Klein for his advice.

## 2011-11-13 NOTE — Telephone Encounter (Signed)
Patient called was told Dr.Klein advised no asa just take warfarin,may use home electronics.

## 2011-11-13 NOTE — Telephone Encounter (Signed)
Data show that ASA + Warfarin is riskier but no  More effective than waarfarin alone  She can use her home electronics as listed  Thnkas steve

## 2011-11-20 ENCOUNTER — Ambulatory Visit (INDEPENDENT_AMBULATORY_CARE_PROVIDER_SITE_OTHER): Payer: Medicare Other | Admitting: *Deleted

## 2011-11-20 DIAGNOSIS — I6350 Cerebral infarction due to unspecified occlusion or stenosis of unspecified cerebral artery: Secondary | ICD-10-CM | POA: Diagnosis not present

## 2011-11-20 DIAGNOSIS — I4891 Unspecified atrial fibrillation: Secondary | ICD-10-CM | POA: Diagnosis not present

## 2011-11-20 LAB — POCT INR: INR: 2.6

## 2011-11-24 ENCOUNTER — Other Ambulatory Visit: Payer: Self-pay | Admitting: Internal Medicine

## 2011-11-24 DIAGNOSIS — Z9889 Other specified postprocedural states: Secondary | ICD-10-CM

## 2011-11-24 DIAGNOSIS — Z853 Personal history of malignant neoplasm of breast: Secondary | ICD-10-CM

## 2011-11-25 ENCOUNTER — Ambulatory Visit (INDEPENDENT_AMBULATORY_CARE_PROVIDER_SITE_OTHER): Payer: Medicare Other | Admitting: *Deleted

## 2011-11-25 ENCOUNTER — Encounter: Payer: Self-pay | Admitting: *Deleted

## 2011-11-25 ENCOUNTER — Encounter: Payer: Self-pay | Admitting: Internal Medicine

## 2011-11-25 DIAGNOSIS — I495 Sick sinus syndrome: Secondary | ICD-10-CM | POA: Insufficient documentation

## 2011-11-25 LAB — PACEMAKER DEVICE OBSERVATION
AL AMPLITUDE: 1.9 mv
ATRIAL PACING PM: 93
BAMS-0001: 150 {beats}/min
BAMS-0003: 70 {beats}/min
DEVICE MODEL PM: 1409809
VENTRICULAR PACING PM: 99

## 2011-11-25 NOTE — Progress Notes (Signed)
PPM check 

## 2011-11-26 ENCOUNTER — Other Ambulatory Visit: Payer: Self-pay | Admitting: Internal Medicine

## 2011-11-26 NOTE — Telephone Encounter (Signed)
Refilled furosemide

## 2011-12-01 ENCOUNTER — Other Ambulatory Visit: Payer: Self-pay | Admitting: Internal Medicine

## 2011-12-01 MED ORDER — FUROSEMIDE 40 MG PO TABS: 40.0000 mg | ORAL_TABLET | Freq: Two times a day (BID) | ORAL | Status: DC

## 2011-12-01 NOTE — Telephone Encounter (Signed)
Refill - Lasix   Pt has ordered this RX via Mailorder.  Pt will run out in the next day, plz send temporary RX to [facility]

## 2011-12-10 ENCOUNTER — Ambulatory Visit (INDEPENDENT_AMBULATORY_CARE_PROVIDER_SITE_OTHER): Payer: Medicare Other | Admitting: Internal Medicine

## 2011-12-10 ENCOUNTER — Encounter: Payer: Self-pay | Admitting: Internal Medicine

## 2011-12-10 VITALS — BP 92/60 | HR 69 | Temp 97.5°F | Resp 14 | Wt 165.0 lb

## 2011-12-10 DIAGNOSIS — L255 Unspecified contact dermatitis due to plants, except food: Secondary | ICD-10-CM | POA: Diagnosis not present

## 2011-12-10 DIAGNOSIS — L237 Allergic contact dermatitis due to plants, except food: Secondary | ICD-10-CM

## 2011-12-10 MED ORDER — PREDNISONE 10 MG PO TABS: 10.0000 mg | ORAL_TABLET | Freq: Every day | ORAL | Status: DC

## 2011-12-10 NOTE — Patient Instructions (Addendum)
1. Contact dermatitis  (poison ivey) - Plan For itching take generic allegra 60 mg three times a day; may also take ranitidine 150 mg twice a day (usually for stomach acid) but it does help the itching  Prednisone burst and taper - sent to pharmacy.   Avoid alcohol rub. Try using soap and water followed by aloe vera containing lotion.   Poison [employer] ivy is a inflammation of the skin (contact dermatitis) caused by touching the allergens on the leaves of the ivy plant following previous exposure to the plant. The rash usually appears 48 hours after exposure. The rash is usually bumps (papules) or blisters (vesicles) in a linear pattern. Depending on your own sensitivity, the rash may simply cause redness and itching, or it may also progress to blisters which may break open. These must be well cared for to prevent secondary bacterial (germ) infection, followed by scarring. Keep any open areas dry, clean, dressed, and covered with an antibacterial ointment if needed. The eyes may also get puffy. The puffiness is worst in the morning and gets better as the day progresses. This dermatitis usually heals without scarring, within 2 to 3 weeks without treatment. HOME CARE INSTRUCTIONS   Thoroughly wash with soap and water as soon as you have been exposed to poison ivy. You have about one half hour to remove the plant resin before it will cause the rash. This washing will destroy the oil or antigen on the skin that is causing, or will cause, the rash. Be sure to wash under your fingernails as any plant resin there will continue to spread the rash. Do not rub skin vigorously when washing affected area. Poison ivy cannot spread if no oil from the plant remains on your body. A rash that has progressed to weeping sores will not spread the rash unless you have not washed thoroughly. It is also important to wash any clothes you have been wearing as these may carry active allergens. The rash will return if you wear the  unwashed clothing, even several days later. Avoidance of the plant in the future is the best measure. Poison ivy plant can be recognized by the number of leaves. Generally, poison ivy has three leaves with flowering branches on a single stem. Diphenhydramine may be purchased over the counter and used as needed for itching. Do not drive with this medication if it makes you drowsy.Ask your caregiver about medication for children. SEEK MEDICAL CARE IF:  Open sores develop.   Redness spreads beyond area of rash.   You notice purulent (pus-like) discharge.   You have increased pain.   Other signs of infection develop (such as fever).  Document Released: 02/21/2000 Document Revised: 05/18/2011 Document Reviewed: 01/09/2009 [employer] Patient Information 2013 [place], [place].

## 2011-12-10 NOTE — Progress Notes (Signed)
Subjective:    Patient ID: [name], female    DOB: May 26, 1931, 78 y.o.   MRN: [id number]  HPI Working in the yard pulling up poison ivey. She then had contact with her forearms 1 week ago. She has now had a spread of an erythematous vesicular rash on the forearms, hands and she say it has spread, from hand contact, to her right groin and leg. She has been self-mediating with alcohol rub and cortisone cream.  PMH, FamHx and SocHx reviewed for any changes and relevance.  Current Outpatient Prescriptions on File Prior to Visit  Medication Sig Dispense Refill  . ADVAIR DISKUS 250-50 MCG/DOSE AEPB INHALE 1 PUFF TWICE DAILY  180 each  5  . atenolol (TENORMIN) 25 MG tablet Take once a day for essential tremor. If need be it can be advanced to twice a day.  180 tablet  3  . atorvastatin (LIPITOR) 20 MG tablet Take 1 tablet (20 mg total) by mouth daily.  90 tablet  3  . BD Microtainer Lancets MISC 1 Device by Subdermal route daily. Use as directed one time daily  100 each  5  . cilostazol (PLETAL) 100 MG tablet TAKE 1 TABLET TWICE DAILY  180 tablet  2  . Ferrous Sulfate (FE-CAPS) 250 MG CPCR Take 1 capsule by mouth daily.        . furosemide (LASIX) 40 MG tablet Take 1 tablet (40 mg total) by mouth 2 (two) times daily.  60 tablet  0  . Glucosamine-Chondroit-Vit C-Mn (GLUCOSAMINE 1500 COMPLEX) CAPS Take 1 capsule by mouth 2 (two) times daily.        Marland Kitchen guaiFENesin (MUCINEX) 600 MG 12 hr tablet Take 1,200 mg by mouth 2 (two) times daily.       Marland Kitchen letrozole (FEMARA) 2.5 MG tablet Take 1 tablet (2.5 mg total) by mouth daily.  90 tablet  12  . levalbuterol (XOPENEX HFA) 45 MCG/ACT inhaler Inhale 2 puffs into the lungs every 6 (six) hours as needed.        . Magnesium 250 MG TABS Take 1 tablet by mouth daily.        . metFORMIN (GLUCOPHAGE) 500 MG tablet Take 500 mg by mouth daily with breakfast.       . mometasone (NASONEX) 50 MCG/ACT nasal spray 2 sprays by Nasal route daily.  51 g  4  .  NIACINAMIDE PO Take by mouth 2 (two) times daily.       . potassium chloride SA (K-DUR,KLOR-CON) 20 MEQ tablet       . tiotropium (SPIRIVA HANDIHALER) 18 MCG inhalation capsule Place 1 capsule (18 mcg total) into inhaler and inhale daily.  90 capsule  4  . vitamin E 100 UNIT capsule Twice weekly       . warfarin (COUMADIN) 5 MG tablet Take 1 tablet (5 mg total) by mouth as directed.  145 tablet  1  . metFORMIN (GLUCOPHAGE) 500 MG tablet Take 500mg  AM for 5 days and then advance to twice a day.  60 tablet  11      Review of Systems System review is negative for any constitutional, cardiac, pulmonary, GI or neuro symptoms or complaints other than as described in the HPI.     Objective:   Physical Exam Filed Vitals:   12/10/11 1707  BP: 92/60  Pulse: 69  Temp: 97.5 F (36.4 C)  Resp: 14   Gen'l- elderly white woman in no distress Derm - erythematous macular/vesicular rash on  forearms, dorsum of hands. Did not examine abdomen or legs       Assessment & Plan:  1. Contact dermatitis  (poison ivey) - Plan For itching take generic allegra 60 mg three times a day; may also take ranitidine 150 mg twice a day (usually for stomach acid) but it does help the itching  Prednisone burst and taper - sent to pharmacy.   Avoid alcohol rub. Try using soap and water followed by aloe vera containing lotion.

## 2011-12-14 ENCOUNTER — Inpatient Hospital Stay (HOSPITAL_COMMUNITY)
Admission: EM | Admit: 2011-12-14 | Discharge: 2011-12-17 | DRG: 191 | Disposition: A | Discharge status: Home / Self Care | Payer: Medicare Other | Attending: Internal Medicine | Admitting: Internal Medicine

## 2011-12-14 ENCOUNTER — Encounter (HOSPITAL_COMMUNITY): Payer: Self-pay | Admitting: *Deleted

## 2011-12-14 ENCOUNTER — Emergency Department (HOSPITAL_COMMUNITY): Payer: Medicare Other

## 2011-12-14 DIAGNOSIS — I509 Heart failure, unspecified: Secondary | ICD-10-CM | POA: Diagnosis present

## 2011-12-14 DIAGNOSIS — C50919 Malignant neoplasm of unspecified site of unspecified female breast: Secondary | ICD-10-CM

## 2011-12-14 DIAGNOSIS — I4891 Unspecified atrial fibrillation: Secondary | ICD-10-CM | POA: Diagnosis not present

## 2011-12-14 DIAGNOSIS — I5032 Chronic diastolic (congestive) heart failure: Secondary | ICD-10-CM

## 2011-12-14 DIAGNOSIS — R079 Chest pain, unspecified: Secondary | ICD-10-CM | POA: Diagnosis not present

## 2011-12-14 DIAGNOSIS — G4733 Obstructive sleep apnea (adult) (pediatric): Secondary | ICD-10-CM | POA: Diagnosis present

## 2011-12-14 DIAGNOSIS — J441 Chronic obstructive pulmonary disease with (acute) exacerbation: Secondary | ICD-10-CM | POA: Diagnosis not present

## 2011-12-14 DIAGNOSIS — M1710 Unilateral primary osteoarthritis, unspecified knee: Secondary | ICD-10-CM | POA: Diagnosis present

## 2011-12-14 DIAGNOSIS — N189 Chronic kidney disease, unspecified: Secondary | ICD-10-CM | POA: Diagnosis present

## 2011-12-14 DIAGNOSIS — IMO0002 Reserved for concepts with insufficient information to code with codable children: Secondary | ICD-10-CM | POA: Diagnosis present

## 2011-12-14 DIAGNOSIS — E119 Type 2 diabetes mellitus without complications: Secondary | ICD-10-CM | POA: Diagnosis present

## 2011-12-14 DIAGNOSIS — F17200 Nicotine dependence, unspecified, uncomplicated: Secondary | ICD-10-CM

## 2011-12-14 DIAGNOSIS — E785 Hyperlipidemia, unspecified: Secondary | ICD-10-CM

## 2011-12-14 DIAGNOSIS — Z950 Presence of cardiac pacemaker: Secondary | ICD-10-CM

## 2011-12-14 DIAGNOSIS — J449 Chronic obstructive pulmonary disease, unspecified: Secondary | ICD-10-CM

## 2011-12-14 DIAGNOSIS — Z8249 Family history of ischemic heart disease and other diseases of the circulatory system: Secondary | ICD-10-CM

## 2011-12-14 DIAGNOSIS — J438 Other emphysema: Secondary | ICD-10-CM | POA: Diagnosis not present

## 2011-12-14 DIAGNOSIS — R0602 Shortness of breath: Secondary | ICD-10-CM | POA: Diagnosis not present

## 2011-12-14 DIAGNOSIS — Z8673 Personal history of transient ischemic attack (TIA), and cerebral infarction without residual deficits: Secondary | ICD-10-CM

## 2011-12-14 DIAGNOSIS — I2510 Atherosclerotic heart disease of native coronary artery without angina pectoris: Secondary | ICD-10-CM | POA: Diagnosis present

## 2011-12-14 DIAGNOSIS — L259 Unspecified contact dermatitis, unspecified cause: Secondary | ICD-10-CM | POA: Diagnosis present

## 2011-12-14 DIAGNOSIS — Z9981 Dependence on supplemental oxygen: Secondary | ICD-10-CM

## 2011-12-14 HISTORY — DX: Heart failure, unspecified: I50.9

## 2011-12-14 LAB — COMPREHENSIVE METABOLIC PANEL
ALT: 17 U/L (ref 0–35)
AST: 23 U/L (ref 0–37)
Albumin: 3.8 g/dL (ref 3.5–5.2)
Chloride: 103 mEq/L (ref 96–112)
Creatinine, Ser: 0.94 mg/dL (ref 0.50–1.10)
Potassium: 4.1 mEq/L (ref 3.5–5.1)
Sodium: 143 mEq/L (ref 135–145)
Total Bilirubin: 0.3 mg/dL (ref 0.3–1.2)

## 2011-12-14 LAB — CBC WITH DIFFERENTIAL/PLATELET
Basophils Absolute: 0 10*3/uL (ref 0.0–0.1)
Basophils Relative: 0 % (ref 0–1)
MCHC: 34.6 g/dL (ref 30.0–36.0)
Monocytes Absolute: 0.9 10*3/uL (ref 0.1–1.0)
Neutro Abs: 8 10*3/uL — ABNORMAL HIGH (ref 1.7–7.7)
Neutrophils Relative %: 70 % (ref 43–77)
Platelets: 239 10*3/uL (ref 150–400)
RDW: 15.4 % (ref 11.5–15.5)
WBC: 11.5 10*3/uL — ABNORMAL HIGH (ref 4.0–10.5)

## 2011-12-14 NOTE — ED Notes (Signed)
The pt is c/o sob since this am 0600am.minimal lt breast pain.  Hx copd

## 2011-12-15 ENCOUNTER — Encounter (HOSPITAL_COMMUNITY): Payer: Self-pay | Admitting: General Practice

## 2011-12-15 ENCOUNTER — Observation Stay (HOSPITAL_COMMUNITY): Payer: Medicare Other

## 2011-12-15 DIAGNOSIS — J441 Chronic obstructive pulmonary disease with (acute) exacerbation: Principal | ICD-10-CM

## 2011-12-15 DIAGNOSIS — J449 Chronic obstructive pulmonary disease, unspecified: Secondary | ICD-10-CM

## 2011-12-15 DIAGNOSIS — Z950 Presence of cardiac pacemaker: Secondary | ICD-10-CM

## 2011-12-15 DIAGNOSIS — F17200 Nicotine dependence, unspecified, uncomplicated: Secondary | ICD-10-CM | POA: Diagnosis not present

## 2011-12-15 DIAGNOSIS — I509 Heart failure, unspecified: Secondary | ICD-10-CM

## 2011-12-15 DIAGNOSIS — I4891 Unspecified atrial fibrillation: Secondary | ICD-10-CM | POA: Diagnosis not present

## 2011-12-15 DIAGNOSIS — I5032 Chronic diastolic (congestive) heart failure: Secondary | ICD-10-CM | POA: Diagnosis present

## 2011-12-15 DIAGNOSIS — J329 Chronic sinusitis, unspecified: Secondary | ICD-10-CM | POA: Diagnosis not present

## 2011-12-15 DIAGNOSIS — E119 Type 2 diabetes mellitus without complications: Secondary | ICD-10-CM

## 2011-12-15 DIAGNOSIS — G4733 Obstructive sleep apnea (adult) (pediatric): Secondary | ICD-10-CM

## 2011-12-15 LAB — TROPONIN I
Troponin I: <0.3 ng/mL (ref <0.30)
Troponin I: <0.3 ng/mL (ref <0.30)
Troponin I: <0.3 ng/mL (ref <0.30)

## 2011-12-15 LAB — GLUCOSE, CAPILLARY

## 2011-12-15 LAB — PROTIME-INR: INR: 2.77 — ABNORMAL HIGH (ref 0.00–1.49)

## 2011-12-15 MED ORDER — FUROSEMIDE 40 MG PO TABS
40.0000 mg | ORAL_TABLET | Freq: Two times a day (BID) | ORAL | Status: DC
Start: 2011-12-15 — End: 2011-12-17
  Administered 2011-12-15 – 2011-12-17 (×5): 40 mg via ORAL
  Filled 2011-12-15 (×7): qty 1

## 2011-12-15 MED ORDER — ATORVASTATIN CALCIUM 20 MG PO TABS
20.0000 mg | ORAL_TABLET | Freq: Every day | ORAL | Status: DC
  Administered 2011-12-15 – 2011-12-16 (×2): 20 mg via ORAL
  Filled 2011-12-15 (×3): qty 1

## 2011-12-15 MED ORDER — TIOTROPIUM BROMIDE MONOHYDRATE 18 MCG IN CAPS
18.0000 ug | ORAL_CAPSULE | Freq: Every day | RESPIRATORY_TRACT | Status: DC
  Administered 2011-12-15 – 2011-12-17 (×3): 18 ug via RESPIRATORY_TRACT
  Filled 2011-12-15: qty 5

## 2011-12-15 MED ORDER — MOXIFLOXACIN HCL 400 MG PO TABS
400.0000 mg | ORAL_TABLET | Freq: Every day | ORAL | Status: DC
  Administered 2011-12-15 – 2011-12-16 (×2): 400 mg via ORAL
  Filled 2011-12-15 (×3): qty 1

## 2011-12-15 MED ORDER — PREDNISONE 20 MG PO TABS
40.0000 mg | ORAL_TABLET | Freq: Every day | ORAL | Status: DC
  Administered 2011-12-15: 40 mg via ORAL
  Filled 2011-12-15 (×2): qty 2

## 2011-12-15 MED ORDER — AZITHROMYCIN 500 MG PO TABS
500.0000 mg | ORAL_TABLET | Freq: Every day | ORAL | Status: DC
  Administered 2011-12-15: 500 mg via ORAL
  Filled 2011-12-15: qty 1

## 2011-12-15 MED ORDER — WARFARIN - PHARMACIST DOSING INPATIENT: Freq: Every day | Status: DC

## 2011-12-15 MED ORDER — FLUTICASONE PROPIONATE 50 MCG/ACT NA SUSP
1.0000 | Freq: Every day | NASAL | Status: DC
  Administered 2011-12-15: 1 via NASAL
  Filled 2011-12-15: qty 16

## 2011-12-15 MED ORDER — SODIUM CHLORIDE 0.9 % IJ SOLN
3.0000 mL | Freq: Two times a day (BID) | INTRAMUSCULAR | Status: DC
  Administered 2011-12-15 – 2011-12-17 (×5): 3 mL via INTRAVENOUS

## 2011-12-15 MED ORDER — LEVALBUTEROL HCL 0.63 MG/3ML IN NEBU: 0.6300 mg | INHALATION_SOLUTION | Freq: Four times a day (QID) | RESPIRATORY_TRACT | Status: DC

## 2011-12-15 MED ORDER — LEVALBUTEROL HCL 0.63 MG/3ML IN NEBU
0.6300 mg | INHALATION_SOLUTION | Freq: Four times a day (QID) | RESPIRATORY_TRACT | Status: DC
  Administered 2011-12-15 (×2): 0.63 mg via RESPIRATORY_TRACT
  Filled 2011-12-15 (×5): qty 3

## 2011-12-15 MED ORDER — SODIUM CHLORIDE 0.9 % IV SOLN: 250.0000 mL | INTRAVENOUS | Status: DC | PRN

## 2011-12-15 MED ORDER — WARFARIN SODIUM 7.5 MG PO TABS
7.5000 mg | ORAL_TABLET | Freq: Every day | ORAL | Status: DC
  Administered 2011-12-15: 7.5 mg via ORAL
  Filled 2011-12-15 (×2): qty 1

## 2011-12-15 MED ORDER — LEVALBUTEROL HCL 0.63 MG/3ML IN NEBU
0.6300 mg | INHALATION_SOLUTION | RESPIRATORY_TRACT | Status: DC
  Administered 2011-12-15 – 2011-12-17 (×7): 0.63 mg via RESPIRATORY_TRACT
  Filled 2011-12-15 (×13): qty 3

## 2011-12-15 MED ORDER — METHYLPREDNISOLONE SODIUM SUCC 40 MG IJ SOLR
40.0000 mg | Freq: Three times a day (TID) | INTRAMUSCULAR | Status: DC
  Administered 2011-12-15 – 2011-12-16 (×3): 40 mg via INTRAVENOUS
  Filled 2011-12-15 (×6): qty 1

## 2011-12-15 MED ORDER — PREDNISONE 20 MG PO TABS
60.0000 mg | ORAL_TABLET | Freq: Once | ORAL | Status: AC
  Administered 2011-12-15: 60 mg via ORAL
  Filled 2011-12-15: qty 3

## 2011-12-15 MED ORDER — WARFARIN SODIUM 5 MG PO TABS
5.0000 mg | ORAL_TABLET | ORAL | Status: DC
Start: 2011-12-15 — End: 2011-12-15

## 2011-12-15 MED ORDER — WARFARIN - PHYSICIAN DOSING INPATIENT: Freq: Every day | Status: DC

## 2011-12-15 MED ORDER — LETROZOLE 2.5 MG PO TABS
2.5000 mg | ORAL_TABLET | Freq: Every day | ORAL | Status: DC
  Administered 2011-12-15 – 2011-12-17 (×3): 2.5 mg via ORAL
  Filled 2011-12-15 (×3): qty 1

## 2011-12-15 MED ORDER — GUAIFENESIN ER 600 MG PO TB12
1200.0000 mg | ORAL_TABLET | Freq: Two times a day (BID) | ORAL | Status: DC
  Administered 2011-12-15 – 2011-12-17 (×5): 1200 mg via ORAL
  Filled 2011-12-15 (×6): qty 2

## 2011-12-15 MED ORDER — SODIUM CHLORIDE 0.9 % IJ SOLN: 3.0000 mL | Freq: Two times a day (BID) | INTRAMUSCULAR | Status: DC

## 2011-12-15 MED ORDER — FLUTICASONE PROPIONATE 50 MCG/ACT NA SUSP
2.0000 | Freq: Every day | NASAL | Status: DC
  Administered 2011-12-15 – 2011-12-17 (×3): 2 via NASAL
  Filled 2011-12-15: qty 16

## 2011-12-15 MED ORDER — SODIUM CHLORIDE 0.9 % IJ SOLN: 3.0000 mL | INTRAMUSCULAR | Status: DC | PRN

## 2011-12-15 MED ORDER — LEVALBUTEROL HCL 0.63 MG/3ML IN NEBU
0.6300 mg | INHALATION_SOLUTION | Freq: Four times a day (QID) | RESPIRATORY_TRACT | Status: DC | PRN
  Filled 2011-12-15: qty 3

## 2011-12-15 MED ORDER — AZITHROMYCIN 250 MG PO TABS: 250.0000 mg | ORAL_TABLET | Freq: Every day | ORAL | Status: DC

## 2011-12-15 MED ORDER — ALBUTEROL SULFATE (5 MG/ML) 0.5% IN NEBU
5.0000 mg | INHALATION_SOLUTION | Freq: Once | RESPIRATORY_TRACT | Status: AC
  Administered 2011-12-15: 5 mg via RESPIRATORY_TRACT
  Filled 2011-12-15: qty 40

## 2011-12-15 MED ORDER — PREDNISONE 10 MG PO TABS
10.0000 mg | ORAL_TABLET | Freq: Every day | ORAL | Status: DC
Start: 2011-12-15 — End: 2011-12-15
  Filled 2011-12-15 (×2): qty 1

## 2011-12-15 NOTE — Progress Notes (Signed)
ANTICOAGULATION CONSULT NOTE - Initial Consult  Pharmacy Consult for warfarin Indication: atrial fibrillation  Allergies  Allergen Reactions  . Cholestatin     RAGWEED SEASON    Patient Measurements: Height: 5\' 5"  (165.1 cm) Weight: 161 lb 9.6 oz (73.3 kg) IBW/kg (Calculated) : 57   Vital Signs: Temp: 98.3 F (36.8 C) (10/08 1853) Temp src: Oral (10/08 1853) BP: 115/60 mmHg (10/08 1853) Pulse Rate: 70  (10/08 1853)  Labs:  Basename 12/15/11 1543 12/15/11 1030 12/15/11 0420 12/14/11 2147  HGB -- -- -- 14.8  HCT -- -- -- 42.8  PLT -- -- -- 239  APTT -- -- -- --  LABPROT -- -- 27.9* --  INR -- -- 2.77* --  HEPARINUNFRC -- -- -- --  CREATININE -- -- -- 0.94  CKTOTAL -- -- -- --  CKMB -- -- -- --  TROPONINI <0.30 <0.30 <0.30 --    Estimated Creatinine Clearance: 49.4 ml/min (by C-G formula based on Cr of 0.94).   Medications:  Prescriptions prior to admission  Medication Sig Dispense Refill  . ADVAIR DISKUS 250-50 MCG/DOSE AEPB INHALE 1 PUFF TWICE DAILY  180 each  5  . atorvastatin (LIPITOR) 20 MG tablet Take 1 tablet (20 mg total) by mouth daily.  90 tablet  3  . BD Microtainer Lancets MISC 1 Device by Subdermal route daily. Use as directed one time daily  100 each  5  . cilostazol (PLETAL) 100 MG tablet TAKE 1 TABLET TWICE DAILY  180 tablet  2  . Ferrous Sulfate (FE-CAPS) 250 MG CPCR Take 1 capsule by mouth daily.        . furosemide (LASIX) 40 MG tablet Take 1 tablet (40 mg total) by mouth 2 (two) times daily.  60 tablet  0  . guaiFENesin (MUCINEX) 600 MG 12 hr tablet Take 1,200 mg by mouth 2 (two) times daily.       Marland Kitchen letrozole (FEMARA) 2.5 MG tablet Take 1 tablet (2.5 mg total) by mouth daily.  90 tablet  12  . levalbuterol (XOPENEX HFA) 45 MCG/ACT inhaler Inhale 2 puffs into the lungs every 6 (six) hours as needed.       . Magnesium 250 MG TABS Take 1 tablet by mouth daily.        . metFORMIN (GLUCOPHAGE) 500 MG tablet Take 500 mg by mouth daily with breakfast.        . mometasone (NASONEX) 50 MCG/ACT nasal spray 2 sprays by Nasal route daily.  51 g  4  . NIACINAMIDE PO Take by mouth 2 (two) times daily.       . potassium chloride SA (K-DUR,KLOR-CON) 20 MEQ tablet       . predniSONE (DELTASONE) 10 MG tablet Take 1 tablet (10 mg total) by mouth daily. 3 tabs daily for 3 days; 2 tabs daily for 3 days; 1 tab daily for 6 days  21 tablet  0  . tiotropium (SPIRIVA HANDIHALER) 18 MCG inhalation capsule Place 1 capsule (18 mcg total) into inhaler and inhale daily.  90 capsule  4  . vitamin E 100 UNIT capsule Twice weekly       . warfarin (COUMADIN) 5 MG tablet Take 7.5 mg by mouth daily. Takes 7.5 mg (1 and 1/2 of 5mg  tab) by mouth daily.      Marland Kitchen DISCONTD: warfarin (COUMADIN) 5 MG tablet Take 1 tablet (5 mg total) by mouth as directed.  145 tablet  1  . atenolol (TENORMIN) 25 MG tablet Take  once a day for essential tremor. If need be it can be advanced to twice a day.  180 tablet  3  . metFORMIN (GLUCOPHAGE) 500 MG tablet Take 500mg  AM for 5 days and then advance to twice a day.  60 tablet  11    Assessment: 78 year old female to continue on warfarin while hospitalized for a COPD exacerbation.  INR 2.77 which is therapeutic. Goal of Therapy:  INR 2-3 Monitor platelets by anticoagulation protocol: Yes   Plan:  Warfarin 7.5mg  already ordered for today and daily.   Will continue daily protimes for now.  Mickeal Skinner 12/15/2011,7:35 PM

## 2011-12-15 NOTE — Progress Notes (Signed)
UR done. 

## 2011-12-15 NOTE — ED Provider Notes (Signed)
History     CSN: [id number]  Arrival date & time 12/14/11  2026   First MD Initiated Contact with Patient 12/14/11 2351      Chief Complaint  Patient presents with  . Shortness of Breath    (Consider location/radiation/quality/duration/timing/severity/associated sxs/prior treatment) HPI Hx per PT, SOB since this am, has had some CP throughout the day. L sided and no longer present. SOB and wheezing not relieved by xopenenx and breathing treatments. Currently on a steroid taper. No F/C, no productive sputum. Uses CPAP and O2 at night but not otherwsie, states she has been active and shopping all day long and continues to have inc SOB, worse with exertion, not alleviated by rest. MOD in severity Past Medical History  Diagnosis Date  . CVA (cerebral vascular accident)   . Sleep apnea     associated with hypersomnia  . Amiodarone pulmonary toxicity   . Renal disease     CHRONIC  . Diabetes   . Tobacco abuse   . Diastolic heart failure     Acute on Chronic  . Pacemaker     Permanent  . AF (atrial fibrillation)      AV ablation 9/09 [facility] per Dr Sampson Goon - AV node ablation 9/11 Dr Graciela Husbands  . Hyperlipidemia   . GERD (gastroesophageal reflux disease)   . [name] (coronary artery disease)     (not sure of this 11/10  . COPD (chronic obstructive pulmonary disease)     emphysema -FeV1 73% DLCO 53% 5/09  . PAD (peripheral artery disease)     w/hx right iliac/SFA stenting and left leg PTA  . Invasive ductal carcinoma of breast 2011    LEFT   . OA (osteoarthritis) of knee     RIGHT  . Right sided sciatica   . Sinoatrial node dysfunction     Past Surgical History  Procedure Date  . Mastectomy, partial 02/03/2010    Left/Dr Rosenbower  . Cataract extraction, bilateral     with IOL/Dr Groat  . Breast lumpectomy     left breast    Family History  Problem Relation Age of Onset  . Heart disease Father     History  Substance Use Topics  . Smoking status: Current Every Day  Smoker -- 1.0 packs/day    Types: Cigarettes  . Smokeless tobacco: Never Used   Comment: started smoking at age 62  . Alcohol Use: Yes     wine occasionally    OB History    Grav Para Term Preterm Abortions TAB SAB Ect Mult Living                  Review of Systems  Constitutional: Negative for fever and chills.  HENT: Negative for neck pain and neck stiffness.   Eyes: Negative for pain.  Respiratory: Positive for cough, shortness of breath and wheezing.   Cardiovascular: Positive for chest pain.  Gastrointestinal: Negative for abdominal pain.  Genitourinary: Negative for dysuria.  Musculoskeletal: Negative for back pain.  Skin: Negative for rash.  Neurological: Negative for headaches.  All other systems reviewed and are negative.    Allergies  Cholestatin  Home Medications   Current Outpatient Rx  Name Route Sig Dispense Refill  . ADVAIR DISKUS 250-50 MCG/DOSE IN AEPB  INHALE 1 PUFF TWICE DAILY 180 each 5  . ATORVASTATIN CALCIUM 20 MG PO TABS Oral Take 1 tablet (20 mg total) by mouth daily. 90 tablet 3  . BD MICROTAINER LANCETS MISC Subdermal 1 Device  by Subdermal route daily. Use as directed one time daily 100 each 5  . CILOSTAZOL 100 MG PO TABS  TAKE 1 TABLET TWICE DAILY 180 tablet 2  . FERROUS SULFATE ER 250 MG PO CPCR Oral Take 1 capsule by mouth daily.      . FUROSEMIDE 40 MG PO TABS Oral Take 1 tablet (40 mg total) by mouth 2 (two) times daily. 60 tablet 0  . GUAIFENESIN ER 600 MG PO TB12 Oral Take 1,200 mg by mouth 2 (two) times daily.     Marland Kitchen LETROZOLE 2.5 MG PO TABS Oral Take 1 tablet (2.5 mg total) by mouth daily. 90 tablet 12  . LEVALBUTEROL TARTRATE 45 MCG/ACT IN AERO Inhalation Inhale 2 puffs into the lungs every 6 (six) hours as needed.     Marland Kitchen MAGNESIUM 250 MG PO TABS Oral Take 1 tablet by mouth daily.      Marland Kitchen METFORMIN HCL 500 MG PO TABS Oral Take 500 mg by mouth daily with breakfast.     . MOMETASONE FUROATE 50 MCG/ACT NA SUSP Nasal 2 sprays by Nasal route  daily. 51 g 4  . NIACINAMIDE PO Oral Take by mouth 2 (two) times daily.     Marland Kitchen POTASSIUM CHLORIDE CRYS ER 20 MEQ PO TBCR      . PREDNISONE 10 MG PO TABS Oral Take 1 tablet (10 mg total) by mouth daily. 3 tabs daily for 3 days; 2 tabs daily for 3 days; 1 tab daily for 6 days 21 tablet 0  . TIOTROPIUM BROMIDE MONOHYDRATE 18 MCG IN CAPS Inhalation Place 1 capsule (18 mcg total) into inhaler and inhale daily. 90 capsule 4  . VITAMIN E 100 UNITS PO CAPS  Twice weekly     . WARFARIN SODIUM 5 MG PO TABS Oral Take 1 tablet (5 mg total) by mouth as directed. 145 tablet 1    Ninety day supply  . ATENOLOL 25 MG PO TABS  Take once a day for essential tremor. If need be it can be advanced to twice a day. 180 tablet 3  . METFORMIN HCL 500 MG PO TABS  Take 500mg  AM for 5 days and then advance to twice a day. 60 tablet 11    BP 124/52  Pulse 70  Temp 98.1 F (36.7 C) (Oral)  Resp 17  SpO2 96%  Physical Exam  Constitutional: She is oriented to person, place, and time. She appears well-developed and well-nourished.  HENT:  Head: Normocephalic and atraumatic.  Eyes: Conjunctivae normal and EOM are normal. Pupils are equal, round, and reactive to light.  Neck: Trachea normal. Neck supple. No thyromegaly present.  Cardiovascular: Normal rate, regular rhythm, S1 normal, S2 normal and normal pulses.     No systolic murmur is present   No diastolic murmur is present  Pulses:      Radial pulses are 2+ on the right side, and 2+ on the left side.  Pulmonary/Chest: She has no rhonchi.       Coarse bilateral breath sound with exp wheezes present, no resp distress. No AMU.   Abdominal: Soft. Normal appearance and bowel sounds are normal. There is no tenderness. There is no CVA tenderness and negative Murphy's sign.  Musculoskeletal:       BLE:s Calves nontender, no cords or erythema, negative Homans sign  Neurological: She is alert and oriented to person, place, and time. She has normal strength. No cranial  nerve deficit or sensory deficit. GCS eye subscore is 4. GCS verbal  subscore is 5. GCS motor subscore is 6.  Skin: Skin is warm and dry. No rash noted. She is not diaphoretic.  Psychiatric: Her speech is normal.       Cooperative and appropriate    ED Course  Procedures (including critical care time)  Results for orders placed during the hospital encounter of 12/14/11  CBC WITH DIFFERENTIAL      Component Value Range   WBC 11.5 (*) 4.0 - 10.5 K/uL   RBC 4.88  3.87 - 5.11 MIL/uL   Hemoglobin 14.8  12.0 - 15.0 g/dL   HCT 16.1  09.6 - 04.5 %   MCV 87.7  78.0 - 100.0 fL   MCH 30.3  26.0 - 34.0 pg   MCHC 34.6  30.0 - 36.0 g/dL   RDW 40.9  81.1 - 91.4 %   Platelets 239  150 - 400 K/uL   Neutrophils Relative 70  43 - 77 %   Neutro Abs 8.0 (*) 1.7 - 7.7 K/uL   Lymphocytes Relative 22  12 - 46 %   Lymphs Abs 2.5  0.7 - 4.0 K/uL   Monocytes Relative 8  3 - 12 %   Monocytes Absolute 0.9  0.1 - 1.0 K/uL   Eosinophils Relative 0  0 - 5 %   Eosinophils Absolute 0.0  0.0 - 0.7 K/uL   Basophils Relative 0  0 - 1 %   Basophils Absolute 0.0  0.0 - 0.1 K/uL  COMPREHENSIVE METABOLIC PANEL      Component Value Range   Sodium 143  135 - 145 mEq/L   Potassium 4.1  3.5 - 5.1 mEq/L   Chloride 103  96 - 112 mEq/L   CO2 28  19 - 32 mEq/L   Glucose, Bld 157 (*) 70 - 99 mg/dL   BUN 20  6 - 23 mg/dL   Creatinine, Ser 7.82  0.50 - 1.10 mg/dL   Calcium 95.6  8.4 - 21.3 mg/dL   Total Protein 7.8  6.0 - 8.3 g/dL   Albumin 3.8  3.5 - 5.2 g/dL   AST 23  0 - 37 U/L   ALT 17  0 - 35 U/L   Alkaline Phosphatase 74  39 - 117 U/L   Total Bilirubin 0.3  0.3 - 1.2 mg/dL   GFR calc non Af Amer 57 (*) >90 mL/min   GFR calc Af Amer 66 (*) >90 mL/min  TROPONIN I      Component Value Range   Troponin I <0.30  <0.30 ng/mL  TROPONIN I      Component Value Range   Troponin I <0.30  <0.30 ng/mL  PROTIME-INR      Component Value Range   Prothrombin Time 27.9 (*) 11.6 - 15.2 seconds   INR 2.77 (*) 0.00 - 1.49    GLUCOSE, CAPILLARY      Component Value Range   Glucose-Capillary 193 (*) 70 - 99 mg/dL   Dg Chest 2 View  10/13/5782  *RADIOLOGY REPORT*  Clinical Data: Right-sided chest pain, shortness of breath, history of COPD, current smoker  CHEST - 2 VIEW  Comparison: 10/09/2010; 01/28/2010; chest CT - 07/14/2010  Findings: Grossly unchanged cardiac silhouette and mediastinal contours with mild tortuosity of the thoracic aorta. Atherosclerotic calcifications within the thoracic aorta.  Stable position of support apparatus.  The lungs remain hyperinflated. There is grossly unchanged diffuse thickening of the pulmonary interstitium without focal airspace opacity. There is flattening of bilateral hemidiaphragms without definite pleural effusion or  pneumothorax. Unchanged bones.  IMPRESSION: Chronic emphysematous and bronchitic change without definite acute cardiopulmonary disease.   Original Report Authenticated By: Waynard Reeds, M.D.       Date: 12/15/2011  Rate: 70  Rhythm: paced  QRS Axis: indeterminate  Intervals: paced  ST/T Wave abnormalities: nonspecific ST changes  Conduction Disutrbances:nonspecific intraventricular conduction delay  Narrative Interpretation:   Old EKG Reviewed: unchanged  ECG, CXR and labs reviewed.   RA pulse with ambulation 87-88% requiring O2. Albuterol and steroids and MED c/s for admit.   D/w Dr Onalee Hua ([facility] hospitalist on call) and plan admit MED fr COPD exacerbation.  MDM   Elderly female with multiple comorbidities including [name], CHF, COPD and now with SOB and wheezing despite home meds and already on steroid taper. Work up as above and medications provided. MED admit. New oxygen requirement.        Sunnie Nielsen, MD 12/15/11 [phone]

## 2011-12-15 NOTE — H&P (Signed)
PCP:   Illene Regulus, MD   Chief Complaint:  wheezing  HPI: 78 yo female h/o chf/copd/osa comes in with several days of uri symptoms and wheezing with sob.  Has oxygen with her cpap at night only at home.  o2 sats 88% in ED on RA.  Received alb neb and feels much better.  Denies fevers.  Good po intake.  No n/v.  Is on predisone now for recent dx and outbreak of poison ivy that she got from her garden.    Review of Systems:  O/w neg  Past Medical History: Past Medical History  Diagnosis Date  . CVA (cerebral vascular accident)   . Sleep apnea     associated with hypersomnia  . Amiodarone pulmonary toxicity   . Renal disease     CHRONIC  . Diabetes   . Tobacco abuse   . Diastolic heart failure     Acute on Chronic  . Pacemaker     Permanent  . AF (atrial fibrillation)      AV ablation 9/09 [facility] per Dr Sampson Goon - AV node ablation 9/11 Dr Graciela Husbands  . Hyperlipidemia   . GERD (gastroesophageal reflux disease)   . [name] (coronary artery disease)     (not sure of this 11/10  . COPD (chronic obstructive pulmonary disease)     emphysema -FeV1 73% DLCO 53% 5/09  . PAD (peripheral artery disease)     w/hx right iliac/SFA stenting and left leg PTA  . Invasive ductal carcinoma of breast 2011    LEFT   . OA (osteoarthritis) of knee     RIGHT  . Right sided sciatica   . Sinoatrial node dysfunction    Past Surgical History  Procedure Date  . Mastectomy, partial 02/03/2010    Left/Dr Rosenbower  . Cataract extraction, bilateral     with IOL/Dr Groat  . Breast lumpectomy     left breast    Medications: Prior to Admission medications   Medication Sig Start Date End Date Taking? Authorizing Provider  ADVAIR DISKUS 250-50 MCG/DOSE AEPB INHALE 1 PUFF TWICE DAILY 08/11/11  Yes Storm Frisk, MD  atorvastatin (LIPITOR) 20 MG tablet Take 1 tablet (20 mg total) by mouth daily. 08/28/11  Yes Jacques Navy, MD  BD Microtainer Lancets MISC 1 Device by Subdermal route daily. Use as  directed one time daily 08/26/11  Yes Jacques Navy, MD  cilostazol (PLETAL) 100 MG tablet TAKE 1 TABLET TWICE DAILY 07/21/11  Yes Duke Salvia, MD  Ferrous Sulfate (FE-CAPS) 250 MG CPCR Take 1 capsule by mouth daily.     Yes Historical Provider, MD  furosemide (LASIX) 40 MG tablet Take 1 tablet (40 mg total) by mouth 2 (two) times daily. 12/01/11  Yes Duke Salvia, MD  guaiFENesin (MUCINEX) 600 MG 12 hr tablet Take 1,200 mg by mouth 2 (two) times daily.    Yes Historical Provider, MD  letrozole (FEMARA) 2.5 MG tablet Take 1 tablet (2.5 mg total) by mouth daily. 07/20/11  Yes Lowella Dell, MD  levalbuterol [facility] HFA) 45 MCG/ACT inhaler Inhale 2 puffs into the lungs every 6 (six) hours as needed.    Yes Historical Provider, MD  Magnesium 250 MG TABS Take 1 tablet by mouth daily.     Yes Historical Provider, MD  metFORMIN (GLUCOPHAGE) 500 MG tablet Take 500 mg by mouth daily with breakfast.  12/10/10  Yes Jacques Navy, MD  mometasone (NASONEX) 50 MCG/ACT nasal spray 2 sprays by  Nasal route daily. 06/20/10  Yes Storm Frisk, MD  NIACINAMIDE PO Take by mouth 2 (two) times daily.    Yes Historical Provider, MD  potassium chloride SA (K-DUR,KLOR-CON) 20 MEQ tablet  08/28/11  Yes Duke Salvia, MD  predniSONE (DELTASONE) 10 MG tablet Take 1 tablet (10 mg total) by mouth daily. 3 tabs daily for 3 days; 2 tabs daily for 3 days; 1 tab daily for 6 days 12/10/11  Yes Jacques Navy, MD  tiotropium (SPIRIVA HANDIHALER) 18 MCG inhalation capsule Place 1 capsule (18 mcg total) into inhaler and inhale daily. 11/03/11  Yes Storm Frisk, MD  vitamin E 100 UNIT capsule Twice weekly    Yes Historical Provider, MD  warfarin (COUMADIN) 5 MG tablet Take 1 tablet (5 mg total) by mouth as directed. 10/14/11  Yes Duke Salvia, MD  atenolol (TENORMIN) 25 MG tablet Take once a day for essential tremor. If need be it can be advanced to twice a day. 12/10/10 12/10/11  Jacques Navy, MD  metFORMIN  (GLUCOPHAGE) 500 MG tablet Take 500mg  AM for 5 days and then advance to twice a day. 10/27/10 10/27/10  Jacques Navy, MD    Allergies:   Allergies  Allergen Reactions  . Cholestatin     RAGWEED SEASON    Social History:  reports that she has been smoking Cigarettes.  She has been smoking about 1 pack per day. She has never used smokeless tobacco. She reports that she drinks alcohol. Her drug history not on file.  Family History: Family History  Problem Relation Age of Onset  . Heart disease Father     Physical Exam: Filed Vitals:   12/14/11 2101 12/15/11 0015 12/15/11 0019 12/15/11 0020  BP: 120/65  124/52   Pulse: 77   70  Temp: 98.1 F (36.7 C)     TempSrc: Oral     Resp: 18   17  SpO2: 90% 96%  96%   General appearance: alert, cooperative and no distress Lungs: clear to auscultation bilaterally Heart: regular rate and rhythm, S1, S2 normal, no murmur, click, rub or gallop Abdomen: soft, non-tender; bowel sounds normal; no masses,  no organomegaly Extremities: extremities normal, atraumatic, no cyanosis or edema Pulses: 2+ and symmetric Skin: Skin color, texture, turgor normal. No rashes or lesions Neurologic: Grossly normal    Labs on Admission:   [facility] 12/14/11 2147  NA 143  K 4.1  CL 103  CO2 28  GLUCOSE 157*  BUN 20  CREATININE 0.94  CALCIUM 10.5  MG --  PHOS --    Basename 12/14/11 2147  AST 23  ALT 17  ALKPHOS 74  BILITOT 0.3  PROT 7.8  ALBUMIN 3.8    Basename 12/14/11 2147  WBC 11.5*  NEUTROABS 8.0*  HGB 14.8  HCT 42.8  MCV 87.7  PLT 239    Basename 12/14/11 2150  CKTOTAL --  CKMB --  CKMBINDEX --  TROPONINI <0.30   Radiological Exams on Admission: Dg Chest 2 View  12/14/2011  *RADIOLOGY REPORT*  Clinical Data: Right-sided chest pain, shortness of breath, history of COPD, current smoker  CHEST - 2 VIEW  Comparison: 10/09/2010; 01/28/2010; chest CT - 07/14/2010  Findings: Grossly unchanged cardiac silhouette and  mediastinal contours with mild tortuosity of the thoracic aorta. Atherosclerotic calcifications within the thoracic aorta.  Stable position of support apparatus.  The lungs remain hyperinflated. There is grossly unchanged diffuse thickening of the pulmonary interstitium without focal airspace opacity. There  is flattening of bilateral hemidiaphragms without definite pleural effusion or pneumothorax. Unchanged bones.  IMPRESSION: Chronic emphysematous and bronchitic change without definite acute cardiopulmonary disease.   Original Report Authenticated By: Waynard Reeds, M.D.     Assessment/Plan Present on Admission:  78 yo female with mild copd exac .COPD exacerbation .CORONARY ARTERY DISEASE .OBSTRUCTIVE SLEEP APNEA .PACEMAKER, PERMANENT .Diastolic CHF, chronic  Pt already much improved with alb nebs.  Cont po steroids she is already on.  Lungs clear now.  obs overnight.  Place on zpack.  freq nebs.  chf compensated.  Stop smoking.  cpap qhs.  Wean oxygen.   Claud Gowan [phone] 12/15/2011, 2:36 AM

## 2011-12-15 NOTE — Progress Notes (Signed)
PM note. Appreciate Dr. Lynelle Doctor assistance. Changes noted: prednisone replaced by Solumedrol      Azithromycin replaced by Avelox po  CT sinus - negative for sinusitis  Will d/c SCD's. Patient is fully anticoagulated on warfarin

## 2011-12-15 NOTE — Progress Notes (Signed)
Subjective: [name] is admitted with an exacerbtin of COPD, probably bronchitic. She had increased SOB and increased WOB. She presented to ED where she improved after neb treatment. She is brought in under observation to provide nebulizer treatment and start prednisone burst and taper along with antibiotic. She believes she caught a cold from her husband. She is asking to go home.  Objective: Lab: Lab Results  Component Value Date   WBC 11.5* 12/14/2011   HGB 14.8 12/14/2011   HCT 42.8 12/14/2011   MCV 87.7 12/14/2011   PLT 239 12/14/2011   BMET    Component Value Date/Time   NA 143 12/14/2011 2147   K 4.1 12/14/2011 2147   CL 103 12/14/2011 2147   CO2 28 12/14/2011 2147   GLUCOSE 157* 12/14/2011 2147   BUN 20 12/14/2011 2147   BUN 18 07/14/2010 0922   CREATININE 0.94 12/14/2011 2147   CREATININE 1.0 07/14/2010 0922   CALCIUM 10.5 12/14/2011 2147   GFRNONAA 57* 12/14/2011 2147   GFRAA 66* 12/14/2011 2147     Imaging: CXR: IMPRESSION:  Chronic emphysematous and bronchitic change without definite acute  cardiopulmonary disease.  Scheduled Meds:   . albuterol  5 mg Nebulization Once  . atorvastatin  20 mg Oral q1800  . azithromycin  500 mg Oral Daily   Followed by  . azithromycin  250 mg Oral Daily  . furosemide  40 mg Oral BID  . guaiFENesin  1,200 mg Oral BID  . letrozole  2.5 mg Oral Daily  . levalbuterol  0.63 mg Nebulization Q6H  . predniSONE  10 mg Oral Q breakfast  . predniSONE  60 mg Oral Once  . sodium chloride  3 mL Intravenous Q12H  . sodium chloride  3 mL Intravenous Q12H  . tiotropium  18 mcg Inhalation Daily  . warfarin  7.5 mg Oral q1800  . Warfarin - Physician Dosing Inpatient   Does not apply q1800  . DISCONTD: fluticasone  1 spray Each Nare Daily  . DISCONTD: warfarin  5 mg Oral UD   Continuous Infusions:  PRN Meds:.sodium chloride, levalbuterol, sodium chloride   Physical Exam: Filed Vitals:   12/15/11 0433  BP:   Pulse: 70  Temp:   Resp: 20    Gen'l - WNWD white woman in no distress, wearing BiPAP mask HEENT - C&S clear Cor- 2+ radial, RRR PUlm - no increased WOB, no wheezing Neuor - A&O x 3      Assessment/Plan: 1. PUlmonary - COPD exacerbation with rapid response to treatment Plan -  continue azithromycin  Prednisone 40 mg daily and then taper  Continue Xopenx q 6  D/c tele  2. Contact dermatitis - much improved.  Will return this PM - may be for d/c   [employer] IM (o) [phone]; (c) [phone] Call-grp - Patsi Sears IM Tele: [phone]  12/15/2011, 6:25 AM

## 2011-12-15 NOTE — Consult Note (Signed)
Name: [name] MRN: [id number] DOB: 1931-07-29    LOS: 1  Referring Provider:  Dr Mayford Knife Reason for Referral:  Copd exac  PULMONARY / CRITICAL CARE MEDICINE  HPI:   This is a 78 year old white female with gold stage D. COPD and ongoing tobacco use. Patient was admitted from the emergency room on 12/14/2011 with COPD exacerbation. We are asked by the patient's daughter-in-law to come by and check on the patient. We  follow this patient in the pulmonary office. Patient continues to smoke.   She  has a nonproductive cough and increased dyspnea. The patient notes sinus congestion and sinus pressure. She has  post nasal drainage. Patient's symptom complex has progressed and she was  admitted for further inpatient care.  Past Medical History  Diagnosis Date  . CVA (cerebral vascular accident)   . Sleep apnea     associated with hypersomnia  . Amiodarone pulmonary toxicity   . Renal disease     CHRONIC  . Diabetes   . Tobacco abuse   . Diastolic heart failure     Acute on Chronic  . Pacemaker     Permanent  . AF (atrial fibrillation)      AV ablation 9/09 [facility] per Dr Sampson Goon - AV node ablation 9/11 Dr Graciela Husbands  . Hyperlipidemia   . GERD (gastroesophageal reflux disease)   . [name] (coronary artery disease)     (not sure of this 11/10  . COPD (chronic obstructive pulmonary disease)     emphysema -FeV1 73% DLCO 53% 5/09  . PAD (peripheral artery disease)     w/hx right iliac/SFA stenting and left leg PTA  . Invasive ductal carcinoma of breast 2011    LEFT   . OA (osteoarthritis) of knee     RIGHT  . Right sided sciatica   . Sinoatrial node dysfunction   . CHF (congestive heart failure)    Past Surgical History  Procedure Date  . Mastectomy, partial 02/03/2010    Left/Dr Rosenbower  . Cataract extraction, bilateral     with IOL/Dr Groat  . Breast lumpectomy     left breast   Prior to Admission medications   Medication Sig Start Date End Date Taking? Authorizing  Provider  ADVAIR DISKUS 250-50 MCG/DOSE AEPB INHALE 1 PUFF TWICE DAILY 08/11/11  Yes Storm Frisk, MD  atorvastatin (LIPITOR) 20 MG tablet Take 1 tablet (20 mg total) by mouth daily. 08/28/11  Yes Jacques Navy, MD  BD Microtainer Lancets MISC 1 Device by Subdermal route daily. Use as directed one time daily 08/26/11  Yes Jacques Navy, MD  cilostazol (PLETAL) 100 MG tablet TAKE 1 TABLET TWICE DAILY 07/21/11  Yes Duke Salvia, MD  Ferrous Sulfate (FE-CAPS) 250 MG CPCR Take 1 capsule by mouth daily.     Yes Historical Provider, MD  furosemide (LASIX) 40 MG tablet Take 1 tablet (40 mg total) by mouth 2 (two) times daily. 12/01/11  Yes Duke Salvia, MD  guaiFENesin (MUCINEX) 600 MG 12 hr tablet Take 1,200 mg by mouth 2 (two) times daily.    Yes Historical Provider, MD  letrozole (FEMARA) 2.5 MG tablet Take 1 tablet (2.5 mg total) by mouth daily. 07/20/11  Yes Lowella Dell, MD  levalbuterol [facility] HFA) 45 MCG/ACT inhaler Inhale 2 puffs into the lungs every 6 (six) hours as needed.    Yes Historical Provider, MD  Magnesium 250 MG TABS Take 1 tablet by mouth daily.     Yes  Historical Provider, MD  metFORMIN (GLUCOPHAGE) 500 MG tablet Take 500 mg by mouth daily with breakfast.  12/10/10  Yes Jacques Navy, MD  mometasone (NASONEX) 50 MCG/ACT nasal spray 2 sprays by Nasal route daily. 06/20/10  Yes Storm Frisk, MD  NIACINAMIDE PO Take by mouth 2 (two) times daily.    Yes Historical Provider, MD  potassium chloride SA (K-DUR,KLOR-CON) 20 MEQ tablet  08/28/11  Yes Duke Salvia, MD  predniSONE (DELTASONE) 10 MG tablet Take 1 tablet (10 mg total) by mouth daily. 3 tabs daily for 3 days; 2 tabs daily for 3 days; 1 tab daily for 6 days 12/10/11  Yes Jacques Navy, MD  tiotropium (SPIRIVA HANDIHALER) 18 MCG inhalation capsule Place 1 capsule (18 mcg total) into inhaler and inhale daily. 11/03/11  Yes Storm Frisk, MD  vitamin E 100 UNIT capsule Twice weekly    Yes Historical Provider, MD    warfarin (COUMADIN) 5 MG tablet Take 7.5 mg by mouth daily. Takes 7.5 mg (1 and 1/2 of 5mg  tab) by mouth daily.   Yes Historical Provider, MD  atenolol (TENORMIN) 25 MG tablet Take once a day for essential tremor. If need be it can be advanced to twice a day. 12/10/10 12/10/11  Jacques Navy, MD  metFORMIN (GLUCOPHAGE) 500 MG tablet Take 500mg  AM for 5 days and then advance to twice a day. 10/27/10 10/27/10  Jacques Navy, MD   Allergies Allergies  Allergen Reactions  . Cholestatin     RAGWEED SEASON    Family History Family History  Problem Relation Age of Onset  . Heart disease Father    Social History  reports that she has been smoking Cigarettes.  She has a 55 pack-year smoking history. She has never used smokeless tobacco. She reports that she drinks alcohol. She reports that she does not use illicit drugs.  Review Of Systems:  Taken in detail and is negative except as per history present illness   Vital Signs: Temp:  [97.4 F (36.3 C)-98.1 F (36.7 C)] 97.4 F (36.3 C) (10/08 1052) Pulse Rate:  [69-79] 69  (10/08 1052) Resp:  [17-21] 20  (10/08 1052) BP: (120-124)/(52-73) 124/73 mmHg (10/08 1052) SpO2:  [90 %-96 %] 96 % (10/08 1356) Weight:  [73.3 kg (161 lb 9.6 oz)] 73.3 kg (161 lb 9.6 oz) (10/08 0500)  Physical Examination: General:  Elderly female in no acute distress Neuro:  Neurologically intact HEENT:  Moist mucous membranes and no lesions Neck:  Neck supple no thyromegaly no jugular venous distention Cardiovascular:  Regular rate and rhythm without S3 normal S1-S2 no murmur rub heave or gallop Lungs:  Distant breath sounds with expired wheezes poor airflow Abdomen:  Soft nontender bowel sounds active no organomegaly Musculoskeletal:  Full range of motion no joint deformity Skin:  Intact  Principal Problem:  *COPD exacerbation Active Problems:  OBSTRUCTIVE SLEEP APNEA  CORONARY ARTERY DISEASE  PACEMAKER, PERMANENT  Diastolic CHF,  chronic   ASSESSMENT AND PLAN  PULMONARY No results found for this basename: PHART:5,PCO2:5,PCO2ART:5,PO2ART:5,HCO3:5,O2SAT:5 in the last 168 hours On nasal oxygen   CXR:  Chronic bronchitic changes without acute infiltrate   A:  Acute exacerbation of chronic obstructive lung disease with tracheobronchitis exacerbated by ongoing tobacco use, probable sinusitis by history and exam P:   Switch to prednisone and IV Medrol Discontinue azithromycin Begin Avelox Administer flutter valve Administer Flonase Check sinus CT scan  CARDIOVASCULAR  Lab 12/15/11 1030 12/15/11 0420 12/14/11 2150  TROPONINI <  0.30 <0.30 <0.30  LATICACIDVEN -- -- --  PROBNP -- -- --   Lines: Peripheral IVs  A: History of chronic diastolic heart failure now compensated P:  Per primary team  RENAL  Lab 12/14/11 2147  NA 143  K 4.1  CL 103  CO2 28  BUN 20  CREATININE 0.94  CALCIUM 10.5  MG --  PHOS --   Intake/Output      10/07 0701 - 10/08 0700 10/08 0701 - 10/09 0700   P.O.  480   I.V. (mL/kg)  3 (0)   Total Intake(mL/kg)  483 (6.6)   Urine (mL/kg/hr) 150 (0.1) 450 (0.8)   Total Output 150 450   Net -150 +33           A:  No acute renal issues P:   Mod  GASTROINTESTINAL  Lab 12/14/11 2147  AST 23  ALT 17  ALKPHOS 74  BILITOT 0.3  PROT 7.8  ALBUMIN 3.8    A:  No acute gastrointestinal issues P:   Monitor  HEMATOLOGIC  Lab 12/15/11 0420 12/14/11 2147  HGB -- 14.8  HCT -- 42.8  PLT -- 239  INR 2.77* --  APTT -- --   A:  No acute hematologic issues P:  Monitor  INFECTIOUS  Lab 12/14/11 2147  WBC 11.5*  PROCALCITON --   Cultures: None Antibiotics: Azithromycin 12/14/2011>>>12/15/2011 Avelox 12/15/2011  A:  Acute tracheobronchitis and also rule out sinusitis P:   Switch to Avelox Obtain CT scan of sinuses  ENDOCRINE  Lab 12/15/11 1148 12/15/11 0616  GLUCAP 211* 193*   A:  Elevated blood sugars on the basis of corticosteroid use   P:   Sliding  scale insulin  NEUROLOGIC  A:  No acute neurologic issues P:   Monitor   Shan Levans, M.D. Beeper  [phone]  Cell  [phone]  If no response or cell goes to voicemail, call beeper [phone]  Pulmonary and Critical Care Medicine [facility]  12/15/2011, 2:19 PM

## 2011-12-15 NOTE — Progress Notes (Signed)
Pt placed on cpap auto titration mode 4-15 cmH2O with 2L O2.  Jacqulynn Cadet RRT

## 2011-12-15 NOTE — ED Notes (Signed)
Pt reports increased shortness of breath that began yesterday approx 0600 - pt w/ hx of sleep apnea and COPD - pt's family concerned pt may have pneumonia. Pt reports hx of chronic productive cough that has been non-productive x3 days. Denies fever or chest pain - pt describes some discomfort in the area to which she has had a lumpectomy. Pt pleasant and in no acute distress on assessment. Lung sounds w/ rhonchi bilat, diminished to left lower lobe. Pt states she used her xopenex inhaler w/o relief.

## 2011-12-15 NOTE — Progress Notes (Signed)
Pt started CPAP 04:33. Settings are Auto 4 minimum 15 maximum. Vitals are HR 70 RR 20 oxygen saturation is 95% with 4 lpm bleed in. Will continue to monitor patient.

## 2011-12-15 NOTE — ED Notes (Signed)
Pulse ox consistently staying between 87-88% while ambulating. MD Optiz made aware.Patient placed on 2L of O2 via nasal cannula upon return to the room.

## 2011-12-16 ENCOUNTER — Observation Stay (HOSPITAL_COMMUNITY): Payer: Medicare Other

## 2011-12-16 DIAGNOSIS — J42 Unspecified chronic bronchitis: Secondary | ICD-10-CM | POA: Diagnosis not present

## 2011-12-16 DIAGNOSIS — R0602 Shortness of breath: Secondary | ICD-10-CM | POA: Diagnosis not present

## 2011-12-16 DIAGNOSIS — R918 Other nonspecific abnormal finding of lung field: Secondary | ICD-10-CM | POA: Diagnosis not present

## 2011-12-16 DIAGNOSIS — F17200 Nicotine dependence, unspecified, uncomplicated: Secondary | ICD-10-CM | POA: Diagnosis not present

## 2011-12-16 DIAGNOSIS — J441 Chronic obstructive pulmonary disease with (acute) exacerbation: Secondary | ICD-10-CM | POA: Diagnosis not present

## 2011-12-16 LAB — PROTIME-INR: Prothrombin Time: 35.2 seconds — ABNORMAL HIGH (ref 11.6–15.2)

## 2011-12-16 LAB — GLUCOSE, CAPILLARY: Glucose-Capillary: 172 mg/dL — ABNORMAL HIGH (ref 70–99)

## 2011-12-16 MED ORDER — PREDNISONE 20 MG PO TABS
30.0000 mg | ORAL_TABLET | Freq: Two times a day (BID) | ORAL | Status: DC
  Administered 2011-12-16 – 2011-12-17 (×3): 30 mg via ORAL
  Filled 2011-12-16 (×5): qty 1

## 2011-12-16 NOTE — Progress Notes (Signed)
Name: [name] MRN: [id number] DOB: 1931-06-12    LOS: 2  Referring Provider:  Dr Mayford Knife Reason for Referral:  Copd exac  PULMONARY / CRITICAL CARE MEDICINE  HPI:   This is a 78 year old white female with gold stage D. COPD and ongoing tobacco use.   Subj: Pt is improved with less dyspnea and cough.  Pt with less congestion. CT Sinus was neg.   Vital Signs: Temp:  [97.4 F (36.3 C)-98.3 F (36.8 C)] 98 F (36.7 C) (10/09 0453) Pulse Rate:  [69-78] 70  (10/09 0453) Resp:  [18-22] 18  (10/09 0559) BP: (115-127)/(59-73) 120/59 mmHg (10/09 0453) SpO2:  [91 %-96 %] 94 % (10/09 0923) Weight:  [160 lb 11.5 oz (72.9 kg)] 160 lb 11.5 oz (72.9 kg) (10/09 0500)  Physical Examination: General:  Elderly female in no acute distress Neuro:  Neurologically intact HEENT:  Moist mucous membranes and no lesions Neck:  Neck supple no thyromegaly no jugular venous distention Cardiovascular:  Regular rate and rhythm without S3 normal S1-S2 no murmur rub heave or gallop Lungs:  Distant breath sounds with improved airflow. Abdomen:  Soft nontender bowel sounds active no organomegaly Musculoskeletal:  Full range of motion no joint deformity Skin:  Intact  Principal Problem:  *COPD exacerbation Active Problems:  OBSTRUCTIVE SLEEP APNEA  CORONARY ARTERY DISEASE  PACEMAKER, PERMANENT  Diastolic CHF, chronic   ASSESSMENT AND PLAN  PULMONARY No results found for this basename: PHART:5,PCO2:5,PCO2ART:5,PO2ART:5,HCO3:5,O2SAT:5 in the last 168 hours On RA Spo2 94%.     A:  Acute exacerbation of chronic obstructive lung disease with tracheobronchitis exacerbated by ongoing tobacco use, now improved. No sinusitis  P:   Finish 5 days more of avelox Ok to change back to pred with slow taper Ok for d/c in AM from my perspective I spent extra time this am on smoking cessation.     Shan Levans, M.D. Beeper  [phone]  Cell  [phone]  If no response or cell goes to  voicemail, call beeper [phone]  Pulmonary and Critical Care Medicine [facility]  12/16/2011, 9:58 AM

## 2011-12-16 NOTE — Progress Notes (Signed)
Inpatient Diabetes Program Recommendations  AACE/ADA: New Consensus Statement on Inpatient Glycemic Control (2013)  Target Ranges:  Prepandial:   less than 140 mg/dL      Peak postprandial:   less than 180 mg/dL (1-2 hours)      Critically ill patients:  140 - 180 mg/dL   Reason for Visit:CBGs 12-15-11  [phone] mg/dl    16-1-09  604 mg/dl  Inpatient Diabetes Program Recommendations Correction (SSI): Start Novolog SENSITIVE correction scale AC & HS if CBGs continue greater than 180 mg/dl and while on steroids.  Note:

## 2011-12-16 NOTE — Progress Notes (Signed)
ANTICOAGULATION CONSULT NOTE - Follow Up Consult  Pharmacy Consult for warfarin Indication: atrial fibrillation  Allergies  Allergen Reactions  . Cholestatin     RAGWEED SEASON    Patient Measurements: Height: 5\' 5"  (165.1 cm) Weight: 160 lb 11.5 oz (72.9 kg) IBW/kg (Calculated) : 57   Vital Signs: Temp: 98 F (36.7 C) (10/09 0453) Temp src: Oral (10/09 0453) BP: 120/59 mmHg (10/09 0453) Pulse Rate: 70  (10/09 0453)  Labs:  Basename 12/16/11 0514 12/15/11 1543 12/15/11 1030 12/15/11 0420 12/14/11 2147  HGB -- -- -- -- 14.8  HCT -- -- -- -- 42.8  PLT -- -- -- -- 239  APTT -- -- -- -- --  LABPROT 35.2* -- -- 27.9* --  INR 3.80* -- -- 2.77* --  HEPARINUNFRC -- -- -- -- --  CREATININE -- -- -- -- 0.94  CKTOTAL -- -- -- -- --  CKMB -- -- -- -- --  TROPONINI -- <0.30 <0.30 <0.30 --    Estimated Creatinine Clearance: 49.4 ml/min (by C-G formula based on Cr of 0.94).   Medications:  Scheduled:    . atorvastatin  20 mg Oral q1800  . fluticasone  2 spray Each Nare Daily  . furosemide  40 mg Oral BID  . guaiFENesin  1,200 mg Oral BID  . letrozole  2.5 mg Oral Daily  . levalbuterol  0.63 mg Nebulization Q4H while awake  . moxifloxacin  400 mg Oral q1800  . predniSONE  30 mg Oral BID WC  . sodium chloride  3 mL Intravenous Q12H  . sodium chloride  3 mL Intravenous Q12H  . tiotropium  18 mcg Inhalation Daily  . warfarin  7.5 mg Oral q1800  . Warfarin - Pharmacist Dosing Inpatient   Does not apply q1800  . DISCONTD: azithromycin  250 mg Oral Daily  . DISCONTD: levalbuterol  0.63 mg Nebulization Q6H  . DISCONTD: methylPREDNISolone (SOLU-MEDROL) injection  40 mg Intravenous Q8H  . DISCONTD: predniSONE  40 mg Oral Q breakfast  . DISCONTD: Warfarin - Physician Dosing Inpatient   Does not apply q1800  . DISCONTD: Warfarin - Physician Dosing Inpatient   Does not apply q1800    Assessment: 78 year old female to continue on warfarin while hospitalized for a COPD  exacerbation. INR =3.8 and aove goal (up from  2.77 on 12/15/11).   Goal of Therapy:  INR 2-3 Monitor platelets by anticoagulation protocol: Yes   Plan:  -Hold coumadin today -PT/INR daily   Harland German, Pharm D 12/16/2011 8:04 AM

## 2011-12-16 NOTE — Progress Notes (Signed)
Subjective: Feeling better. Reports that she isn't bring up any phlegm but she also isn't coughing. No SOB.  Objective: Lab: Lab Results  Component Value Date   WBC 11.5* 12/14/2011   HGB 14.8 12/14/2011   HCT 42.8 12/14/2011   MCV 87.7 12/14/2011   PLT 239 12/14/2011   BMET    Component Value Date/Time   NA 143 12/14/2011 2147   K 4.1 12/14/2011 2147   CL 103 12/14/2011 2147   CO2 28 12/14/2011 2147   GLUCOSE 157* 12/14/2011 2147   BUN 20 12/14/2011 2147   BUN 18 07/14/2010 0922   CREATININE 0.94 12/14/2011 2147   CREATININE 1.0 07/14/2010 0922   CALCIUM 10.5 12/14/2011 2147   GFRNONAA 57* 12/14/2011 2147   GFRAA 66* 12/14/2011 2147   Troponin I: neg x 4  Imaging: CT sinus 10/08: Findings: The paranasal sinuses are clear. No air-fluid level or  significant mucosal thickening. No acute bony change.  IMPRESSION:  Negative for sinusitis.   Scheduled Meds:   . atorvastatin  20 mg Oral q1800  . fluticasone  2 spray Each Nare Daily  . furosemide  40 mg Oral BID  . guaiFENesin  1,200 mg Oral BID  . letrozole  2.5 mg Oral Daily  . levalbuterol  0.63 mg Nebulization Q4H while awake  . methylPREDNISolone (SOLU-MEDROL) injection  40 mg Intravenous Q8H  . moxifloxacin  400 mg Oral q1800  . sodium chloride  3 mL Intravenous Q12H  . sodium chloride  3 mL Intravenous Q12H  . tiotropium  18 mcg Inhalation Daily  . warfarin  7.5 mg Oral q1800  . Warfarin - Pharmacist Dosing Inpatient   Does not apply q1800  . DISCONTD: azithromycin  250 mg Oral Daily  . DISCONTD: levalbuterol  0.63 mg Nebulization Q6H  . DISCONTD: predniSONE  40 mg Oral Q breakfast  . DISCONTD: Warfarin - Physician Dosing Inpatient   Does not apply q1800  . DISCONTD: Warfarin - Physician Dosing Inpatient   Does not apply q1800   Continuous Infusions:  PRN Meds:.sodium chloride, sodium chloride   Physical Exam: Filed Vitals:   12/16/11 0559  BP:   Pulse:   Temp:   Resp: 18   Gen'l- older white woman in no  distress Cor- IRIR rate controlled Pulm - no increased WOB. Very feint end-expiratory wheeze at left bse, no rales, no accessory muscle use. Neuor - A&O x 3.      Assessment/Plan: 1. Pulmonary - COPD exacerbation. Antibiotics: 2/2 Azithromyicin, #2 Avelox. Has had several doses IV solumedrol - very little wheezing.  Plan - will convert back to oral prednisone  30 mg bid  Continue avelox  F/u CXR 2 view this PM  3. Cardiac - cardiac enzymes normal. A. Fib stable.  Plan - d/c/ tele   Illene Regulus [facility] (o) [phone]; (c) [phone] Call-grp - Patsi Sears [facility] Tele: [phone]  12/16/2011, 7:37 AM

## 2011-12-17 ENCOUNTER — Telehealth: Payer: Self-pay | Admitting: Internal Medicine

## 2011-12-17 DIAGNOSIS — C50919 Malignant neoplasm of unspecified site of unspecified female breast: Secondary | ICD-10-CM | POA: Diagnosis not present

## 2011-12-17 DIAGNOSIS — Z8673 Personal history of transient ischemic attack (TIA), and cerebral infarction without residual deficits: Secondary | ICD-10-CM | POA: Diagnosis not present

## 2011-12-17 DIAGNOSIS — J449 Chronic obstructive pulmonary disease, unspecified: Secondary | ICD-10-CM | POA: Diagnosis not present

## 2011-12-17 DIAGNOSIS — N189 Chronic kidney disease, unspecified: Secondary | ICD-10-CM | POA: Diagnosis present

## 2011-12-17 DIAGNOSIS — I509 Heart failure, unspecified: Secondary | ICD-10-CM | POA: Diagnosis present

## 2011-12-17 DIAGNOSIS — Z8249 Family history of ischemic heart disease and other diseases of the circulatory system: Secondary | ICD-10-CM | POA: Diagnosis not present

## 2011-12-17 DIAGNOSIS — Z950 Presence of cardiac pacemaker: Secondary | ICD-10-CM | POA: Diagnosis not present

## 2011-12-17 DIAGNOSIS — J441 Chronic obstructive pulmonary disease with (acute) exacerbation: Secondary | ICD-10-CM | POA: Diagnosis not present

## 2011-12-17 DIAGNOSIS — F17200 Nicotine dependence, unspecified, uncomplicated: Secondary | ICD-10-CM | POA: Diagnosis not present

## 2011-12-17 DIAGNOSIS — G4733 Obstructive sleep apnea (adult) (pediatric): Secondary | ICD-10-CM | POA: Diagnosis not present

## 2011-12-17 DIAGNOSIS — Z9981 Dependence on supplemental oxygen: Secondary | ICD-10-CM | POA: Diagnosis not present

## 2011-12-17 DIAGNOSIS — E119 Type 2 diabetes mellitus without complications: Secondary | ICD-10-CM | POA: Diagnosis present

## 2011-12-17 DIAGNOSIS — IMO0002 Reserved for concepts with insufficient information to code with codable children: Secondary | ICD-10-CM | POA: Diagnosis present

## 2011-12-17 DIAGNOSIS — L259 Unspecified contact dermatitis, unspecified cause: Secondary | ICD-10-CM | POA: Diagnosis present

## 2011-12-17 DIAGNOSIS — I2510 Atherosclerotic heart disease of native coronary artery without angina pectoris: Secondary | ICD-10-CM | POA: Diagnosis present

## 2011-12-17 DIAGNOSIS — I5032 Chronic diastolic (congestive) heart failure: Secondary | ICD-10-CM | POA: Diagnosis present

## 2011-12-17 LAB — GLUCOSE, CAPILLARY
Glucose-Capillary: 230 mg/dL — ABNORMAL HIGH (ref 70–99)
Glucose-Capillary: 128 mg/dL — ABNORMAL HIGH (ref 70–99)

## 2011-12-17 MED ORDER — MOXIFLOXACIN HCL 400 MG PO TABS: 400.0000 mg | ORAL_TABLET | Freq: Every day | ORAL | Status: DC

## 2011-12-17 MED ORDER — PREDNISONE 10 MG PO TABS: 10.0000 mg | ORAL_TABLET | Freq: Every day | ORAL | Status: DC

## 2011-12-17 NOTE — Discharge Summary (Signed)
[name], [name]              ACCOUNT NO.:  [id number]  MEDICAL RECORD NO.:  [id number]  LOCATION:  4706                         FACILITY:  [facility]  PHYSICIAN:  Tiffany Gess. Rashae Rother, MD  DATE OF BIRTH:  Jul 12, 1931  DATE OF ADMISSION:  12/14/2011 DATE OF DISCHARGE:  12/17/2011                              DISCHARGE SUMMARY   ADMITTING DIAGNOSES: 1. Chronic obstructive pulmonary disease exacerbation. 2. History of coronary artery disease. 3. History of obstructive sleep apnea. 4. History of pacemaker. 5. History of chronic diastolic heart failure.  DISCHARGE DIAGNOSES: 1. Chronic obstructive pulmonary disease exacerbation. 2. History of coronary artery disease. 3. History of obstructive sleep apnea. 4. History of pacemaker. 5. History of chronic diastolic heart failure.  CONSULTANTS:  Charlcie Cradle. Delford Field, MD, FCCP for Pulmonary/CCM.  PROCEDURES:  Chest x-ray day of admission, which read out as chronic emphysematous and bronchitic change without definite acute cardiopulmonary disease.  CT scan of the maxillofacial region in August 2013 read out as negative for sinusitis.  Chest x-ray December 16, 2011, which showed chronic bronchitic and emphysematous lung changes but no definite acute overlying pulmonary process.  HISTORY OF PRESENT ILLNESS:  [name] is a 78 year old woman with history of COPD, obstructive sleep apnea, and does continue to smoke. She presented to the emergency department with a several day history of upper respiratory symptoms including cough, rhinorrhea, and wheezing along with shortness of breath.  She does use oxygen with her CPAP at night.  Does not use oxygen during the daytime.  In the emergency department, her oxygen saturation on room air was 88%.  She was given an albuterol nebulizer treatment in the emergency department and felt much better.  She denied any fevers or chills.  She denies having purulent sputum.  She has had no nausea, vomiting,  and had good p.o. intake.  She has been on prednisone Dosepak and taper for recent outbreak of contact dermatitis.  Because of the patient's shortness of breath, she was initially admitted for overnight observation.  She had been started on azithromycin and was to continue on breathing treatments.  She was started on prednisone, given 60 mg of prednisone in the emergency department.  Please see the H and P as well as past epic notes for past medical history, family history, social history, and admission examination.  Of note, her exam was notable for her lungs only clear to auscultation bilaterally per the admitting physician.  HOSPITAL COURSE: 1. COPD.  The patient with bronchitic exacerbation.  She was started     on azithromycin as noted.  Her prednisone was bumped to 40 mg     daily.  The patient was seen at the request of the patient's     daughter by Dr. Shan Levans.  He changed her antibiotics from     azithromycin to Avelox.  In addition, he put her on IV Solu-Medrol.     A CT scan of the sinuses was ordered to rule out sinusitis as a     cause of her symptoms, which was a negative study.  The patient continued to improve.  She was put back to p.o.     prednisone at 30  mg b.i.d. and was to continue on oral Avelox.  On     this regimen, the patient reported that she did not have any     productive cough, she was not actively short of breath and had no     wheezing.  With the patient appearing to be stabilized with no     shortness of breath or other acute respiratory symptoms, she is     deemed stable for discharge to home to continue oral medications     including 5 additional days of Avelox as well as a slow prednisone     taper. 2. The patient's other medical problems did remain stable during this     hospitalization.  DISCHARGE EXAMINATION:  VITAL SIGNS:  Temperature was 97.4, blood pressure 139/81, pulse was 71, respirations were 19, oxygen saturation while she was  on oxygen related by CPAP was 98%. GENERAL APPEARANCE:  This is an overweight Caucasian woman in no acute distress. HEENT:  Exam was limited by CPAP mask. PULMONARY:  The patient has no increased work of breathing at rest.  She is moving air well.  She had no rales, wheezes, or rhonchi. CARDIOVASCULAR:  Radial pulse 2+.  Her precordium was quiet.  She had a regular rate and rhythm.  No further examination was conducted.  FINAL LABORATORY:  The patient had admission chemistries on the 7th which were unremarkable with a creatinine of 0.94, glucose mildly elevated at 157, possibly related to steroids.  Liver functions were normal.  The patient's final CBC from October 7 showed white count of 11,500 with 70% segs, 22% lymphs, 8% monocytes.  Hemoglobin was 14.8 g, platelet count was 239,000.  INR on the day of discharge was 3.8.  She is on chronic Coumadin therapy for atrial fibrillation.  The patient's last A1c prior to hospitalization was 7.2% on August 27, 2011.  This is considered adequate control given her chronic use of steroids and relatively poor adherence to a diabetic diet.  DISPOSITION:  The patient is to be discharged home.  She will continue on all of her home medications.  She will take Avelox for 5 days.  She will use prednisone 30 mg p.o. b.i.d. for 2 additional days and 20 mg b.i.d. for 3 days.  She will then take 20 mg daily until she is seen in the office for followup.  The patient will be seen for followup in 7-10 days.  She will be contacted by the office with an appointment time.  The patient is adamantly instructed to not smoke.  The patient's condition at time of discharge dictation is medically improved and stable.     Tiffany Gess Mazikeen Hehn, MD     MEN/MEDQ  D:  12/17/2011  T:  12/17/2011  Job:  [id number]  cc:   Charlcie Cradle. Delford Field, MD, FCCP

## 2011-12-17 NOTE — Telephone Encounter (Signed)
Message copied by Newell Coral on Thu Dec 17, 2011  1:36 PM ------      Message from: Illene Regulus E      Created: Thu Dec 17, 2011  6:30 AM       Needs hospital f/u appt next Thursday. Thanks. She will be at home later this afternoon. Current at [phone]

## 2011-12-17 NOTE — Progress Notes (Signed)
Patient doing well: no respiratory distress or increased work of breathing, no wheezing. For d/c home.  Dictated # [id number]

## 2011-12-17 NOTE — Telephone Encounter (Signed)
Called patient, lvmom.  Will try back soon if call isn't returned, thanks!

## 2011-12-18 ENCOUNTER — Ambulatory Visit (INDEPENDENT_AMBULATORY_CARE_PROVIDER_SITE_OTHER): Payer: Medicare Other | Admitting: *Deleted

## 2011-12-18 DIAGNOSIS — I6350 Cerebral infarction due to unspecified occlusion or stenosis of unspecified cerebral artery: Secondary | ICD-10-CM | POA: Diagnosis not present

## 2011-12-18 DIAGNOSIS — I4891 Unspecified atrial fibrillation: Secondary | ICD-10-CM | POA: Diagnosis not present

## 2011-12-18 NOTE — Patient Instructions (Signed)
Prednisone 30mg s Twice a day for 2 days. Then 20mg s  Twice a day for 3 days. Then 20mg s daily.

## 2011-12-23 ENCOUNTER — Ambulatory Visit (INDEPENDENT_AMBULATORY_CARE_PROVIDER_SITE_OTHER): Payer: Medicare Other | Admitting: *Deleted

## 2011-12-23 ENCOUNTER — Ambulatory Visit
Admission: RE | Admit: 2011-12-23 | Discharge: 2011-12-23 | Disposition: A | Discharge status: Home / Self Care | Payer: Medicare Other | Source: Ambulatory Visit | Attending: Internal Medicine | Admitting: Internal Medicine

## 2011-12-23 ENCOUNTER — Inpatient Hospital Stay: Payer: Medicare Other | Admitting: Critical Care Medicine

## 2011-12-23 DIAGNOSIS — Z9889 Other specified postprocedural states: Secondary | ICD-10-CM

## 2011-12-23 DIAGNOSIS — I6350 Cerebral infarction due to unspecified occlusion or stenosis of unspecified cerebral artery: Secondary | ICD-10-CM | POA: Diagnosis not present

## 2011-12-23 DIAGNOSIS — I4891 Unspecified atrial fibrillation: Secondary | ICD-10-CM

## 2011-12-23 DIAGNOSIS — Z853 Personal history of malignant neoplasm of breast: Secondary | ICD-10-CM

## 2011-12-23 DIAGNOSIS — R928 Other abnormal and inconclusive findings on diagnostic imaging of breast: Secondary | ICD-10-CM | POA: Diagnosis not present

## 2011-12-24 ENCOUNTER — Ambulatory Visit (INDEPENDENT_AMBULATORY_CARE_PROVIDER_SITE_OTHER): Payer: Medicare Other | Admitting: Critical Care Medicine

## 2011-12-24 ENCOUNTER — Encounter: Payer: Self-pay | Admitting: Critical Care Medicine

## 2011-12-24 ENCOUNTER — Telehealth: Payer: Self-pay | Admitting: Critical Care Medicine

## 2011-12-24 VITALS — BP 104/60 | HR 91 | Temp 97.7°F | Ht 65.0 in | Wt 160.0 lb

## 2011-12-24 DIAGNOSIS — F17200 Nicotine dependence, unspecified, uncomplicated: Secondary | ICD-10-CM | POA: Diagnosis not present

## 2011-12-24 DIAGNOSIS — Z23 Encounter for immunization: Secondary | ICD-10-CM | POA: Diagnosis not present

## 2011-12-24 DIAGNOSIS — J441 Chronic obstructive pulmonary disease with (acute) exacerbation: Secondary | ICD-10-CM | POA: Diagnosis not present

## 2011-12-24 MED ORDER — DIPHENHYD-HYDROCORT-NYSTATIN MT SUSP: OROMUCOSAL | Status: DC

## 2011-12-24 MED ORDER — NICOTINE 10 MG IN INHA: RESPIRATORY_TRACT | Status: DC

## 2011-12-24 MED ORDER — FLUCONAZOLE 100 MG PO TABS: ORAL_TABLET | ORAL | Status: DC

## 2011-12-24 MED ORDER — PREDNISONE 10 MG PO TABS: 10.0000 mg | ORAL_TABLET | Freq: Every day | ORAL | Status: DC

## 2011-12-24 NOTE — Assessment & Plan Note (Signed)
Gold stage C. COPD with ongoing tobacco use and frequent exacerbations Recent hospitalization with COPD exacerbation yet again Oral pharyngeal candidiasis now as a result of steroid and antibiotic usage At this visit greater than 10 minutes of smoking cessation counseling was given Plan Diflucan 100mg  Take two once then one daily until gone   Take 4 more days of prednisone 10mg  daily then stop. You will have left over medication No change on inhalers Stop smoking, use nicotrol. 60-80 puff per cartridge, use 4 cartridges per day Use magic mouth wash 5-10 ML three times per day, swish gargle, expectorate Use  flutter valve 4 times daily Flu vaccine was given Return 1 month

## 2011-12-24 NOTE — Telephone Encounter (Signed)
Pt aware of directions and I have sent RX into the pharmacy. Nothing further was needed

## 2011-12-24 NOTE — Progress Notes (Signed)
Subjective:    Patient ID: [name], female    DOB: Mar 23, 1931, 78 y.o.   MRN: [id number]  HPI  78 y.o.   white female with history of chronic obstructive lung disease, asthmatic bronchitis, and obstructive sleep apnea. Active smoker and has failed smoking cessation on multiple attempts.   07/17/2011 Copd f/u.  Pt states dyspnea is the same.  Did have mucus when it was raining.  Using mucinex. Smokes 1PPD.  No real chest pain.  No real palpitations. Pt denies any significant sore throat, nasal congestion or excess secretions, fever, chills, sweats, unintended weight loss, pleurtic or exertional chest pain, orthopnea PND, or leg swelling Pt denies any increase in rescue therapy over baseline, denies waking up needing it or having any early am or nocturnal exacerbations of coughing/wheezing/or dyspnea. Pt also denies any obvious fluctuation in symptoms with  weather or environmental change or other alleviating or aggravating factors  12/24/2011 Pt just in hospital.10/7>>10/10    DISCHARGE DIAGNOSES:  1. Chronic obstructive pulmonary disease exacerbation.  2. History of coronary artery disease.  3. History of obstructive sleep apnea.  4. History of pacemaker.  5. History of chronic diastolic heart failure.   Chest x-ray day of admission, which read out as chronic  emphysematous and bronchitic change without definite acute  cardiopulmonary disease.  CT scan of the maxillofacial region in August 2013 read out as negative  for sinusitis.  Chest x-ray December 16, 2011, which showed chronic bronchitic and  emphysematous lung changes but no definite acute overlying pulmonary  process.    Since D/C:   No direction on pred given,  Since d/c took 3/d x 3days, then 2 for 3days, now on 1/d (for today and yesterday) Off avelox.   Now: no real cough. Pt still feels congested.  Volume is reduced of mucus.  Pt did have more mucus up 24hrs ago.  Pt has mucinex .  Has flutter valve (not using  ) Smoked some at home.       No real edema in feet.  Mouth and tongue sore.   Called in MMW.     Past Medical History  Diagnosis Date  . CVA (cerebral vascular accident)   . Sleep apnea     associated with hypersomnia  . Amiodarone pulmonary toxicity   . Renal disease     CHRONIC  . Diabetes   . Tobacco abuse   . Diastolic heart failure     Acute on Chronic  . Pacemaker     Permanent  . AF (atrial fibrillation)      AV ablation 9/09 [facility] per Dr Sampson Goon - AV node ablation 9/11 Dr Graciela Husbands  . Hyperlipidemia   . GERD (gastroesophageal reflux disease)   . [name] (coronary artery disease)     (not sure of this 11/10  . COPD (chronic obstructive pulmonary disease)     emphysema -FeV1 73% DLCO 53% 5/09  . PAD (peripheral artery disease)     w/hx right iliac/SFA stenting and left leg PTA  . Invasive ductal carcinoma of breast 2011    LEFT   . OA (osteoarthritis) of knee     RIGHT  . Right sided sciatica   . Sinoatrial node dysfunction   . CHF (congestive heart failure)      Family History  Problem Relation Age of Onset  . Heart disease Father      History   Social History  . Marital Status: Married    Spouse Name: N/A  Number of Children: N/A  . Years of Education: N/A   Occupational History  . Not on file.   Social History Main Topics  . Smoking status: Current Every Day Smoker -- 1.0 packs/day for 55 years    Types: Cigarettes  . Smokeless tobacco: Never Used   Comment: started smoking at age 14--4 cigs per day  . Alcohol Use: Yes     wine occasionally  . Drug Use: No  . Sexually Active: No   Other Topics Concern  . Not on file   Social History Narrative   HS Graduate; [employer]. [employer].  Married '61.  2 sons - '70, '71; Dtrs - '64,'68; 6 grandchildren.  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  Interests -Tai-chi & water aerobics, gardening, active lifestyle.  Marriage a bit stressful - SO w/membory problems and difficult behavior. She denies any personal safety concerns. End of life care: need to address at next OV      Allergies  Allergen Reactions  . Cholestatin     RAGWEED SEASON     Outpatient Prescriptions Prior to Visit  Medication Sig Dispense Refill  . ADVAIR DISKUS 250-50 MCG/DOSE AEPB INHALE 1 PUFF TWICE DAILY  180 each  5  . atorvastatin (LIPITOR) 20 MG tablet Take 1 tablet (20 mg total) by mouth daily.  90 tablet  3  . BD Microtainer Lancets MISC 1 Device by Subdermal route daily. Use as directed one time daily  100 each  5  . cilostazol (PLETAL) 100 MG tablet TAKE 1 TABLET TWICE DAILY  180 tablet  2  . Ferrous Sulfate (FE-CAPS) 250 MG CPCR Take 1 capsule by mouth daily.        Marland Kitchen guaiFENesin (MUCINEX) 600 MG 12 hr tablet Take 600 mg by mouth 2 (two) times daily. 2 pills in AM 1 pill in PM      . letrozole (FEMARA) 2.5 MG tablet Take 1 tablet (2.5 mg total) by mouth daily.  90 tablet  12  . levalbuterol (XOPENEX HFA) 45 MCG/ACT inhaler Inhale 2 puffs into the lungs every 6 (six) hours as needed.       . Magnesium 250 MG TABS Take 1 tablet by mouth daily.        . metFORMIN (GLUCOPHAGE) 500 MG tablet Take 500 mg by mouth daily with breakfast.       . mometasone (NASONEX) 50 MCG/ACT nasal spray 2 sprays by Nasal route daily.  51 g  4  . NIACINAMIDE PO Take by mouth 2 (two) times daily.       . potassium chloride SA (K-DUR,KLOR-CON) 20 MEQ tablet       . tiotropium (SPIRIVA HANDIHALER) 18 MCG inhalation capsule Place 1 capsule (18 mcg total) into inhaler and inhale daily.  90 capsule  4  . vitamin E 100 UNIT capsule Twice weekly       . warfarin (COUMADIN) 5 MG tablet Take 7.5 mg by mouth daily. Takes 7.5 mg (1 and 1/2 of 5mg  tab) by mouth daily.      . furosemide (LASIX) 40 MG tablet Take 1 tablet (40 mg total) by mouth 2 (two) times daily.  60 tablet  0  . predniSONE  (DELTASONE) 10 MG tablet Take 1 tablet (10 mg total) by mouth daily. 3 tabs bid x 2 days, 2 tabs bid x 3 days, 1 tab  daily until office f/u  50 tablet  0  . atenolol (TENORMIN) 25 MG tablet Take once a day for essential tremor. If need be it can be advanced to twice a day.  180 tablet  3  . metFORMIN (GLUCOPHAGE) 500 MG tablet Take 500mg  AM for 5 days and then advance to twice a day.  60 tablet  11  . moxifloxacin (AVELOX) 400 MG tablet Take 1 tablet (400 mg total) by mouth daily at 6 PM.  5 tablet  0     Review of Systems  Constitutional:   No  weight loss, night sweats,  Fevers, chills, fatigue, lassitude. HEENT:   No headaches,  Difficulty swallowing,  Tooth/dental problems,  Sore throat,                No sneezing, itching, ear ache, nasal congestion, post nasal drip,   CV:  No chest pain,  Orthopnea, PND, swelling in lower extremities, anasarca, dizziness, palpitations  GI  No heartburn, indigestion, abdominal pain, nausea, vomiting, diarrhea, change in bowel habits, loss of appetite  Resp: Notes  shortness of breath with exertion not  at rest.  No excess mucus, notes  productive cough,  No non-productive cough,  No coughing up of blood.  No change in color of mucus.  No wheezing.  No chest wall deformity  Skin: no rash or lesions.  GU: no dysuria, change in color of urine, no urgency or frequency.  No flank pain.  MS:  No joint pain or swelling.  No decreased range of motion.  No back pain.  Psych:  No change in mood or affect. No depression or anxiety.  No memory loss.     Objective:   Physical Exam BP 104/60  Pulse 91  Temp 97.7 F (36.5 C) (Oral)  Ht 5\' 5"  (1.651 m)  Wt 160 lb (72.576 kg)  BMI 26.63 kg/m2  SpO2 91%  Gen: Pleasant, well-nourished, in no distress,  normal affect  ENT: No lesions,  mouth clear,  oropharynx clear, no postnasal drip  Neck: No JVD, no TMG, no carotid bruits  Lungs: No use of accessory muscles, no dullness to percussion, distant bs,  scattered rhonchi  Cardiovascular: RRR, heart sounds normal, no murmur or gallops, no peripheral edema  Abdomen: soft and NT, no HSM,  BS normal  Musculoskeletal: No deformities, no cyanosis or clubbing  Neuro: alert, non focal  Skin: Warm, no lesions or rashes     All laboratory data and chest x-rays from recent hospitalization reviewed they are available in the electronic chart CT scan of sinus was reviewed and was negative for sinusitis   Assessment & Plan:   COPD exacerbation Gold stage C. COPD with ongoing tobacco use and frequent exacerbations Recent hospitalization with COPD exacerbation yet again Oral pharyngeal candidiasis now as a result of steroid and antibiotic usage At this visit greater than 10 minutes of smoking cessation counseling was given Plan Diflucan 100mg  Take two once then one daily until gone   Take 4 more days of prednisone 10mg  daily then stop. You will have left over medication No change on inhalers Stop smoking, use nicotrol. 60-80 puff per cartridge, use 4 cartridges per day Use magic mouth wash 5-10 ML three times per day, swish gargle, expectorate Use  flutter valve 4 times daily Flu vaccine was given Return 1 month   NICOTINE ADDICTION Ongoing tobacco use as of this visit 12/24/2011 Greater than 10 minutes of smoking cessation counseling was given to this  patient Will initiate Nicotrol for nicotine replacement therapy    Updated Medication List Outpatient Encounter Prescriptions as of 12/24/2011  Medication Sig Dispense Refill  . ADVAIR DISKUS 250-50 MCG/DOSE AEPB INHALE 1 PUFF TWICE DAILY  180 each  5  . atorvastatin (LIPITOR) 20 MG tablet Take 1 tablet (20 mg total) by mouth daily.  90 tablet  3  . BD Microtainer Lancets MISC 1 Device by Subdermal route daily. Use as directed one time daily  100 each  5  . cilostazol (PLETAL) 100 MG tablet TAKE 1 TABLET TWICE DAILY  180 tablet  2  . Diphenhyd-Hydrocort-Nystatin SUSP 5-10 ML Swish and  gargle expectorate 3 times daily  240 mL  0  . Ferrous Sulfate (FE-CAPS) 250 MG CPCR Take 1 capsule by mouth daily.        . furosemide (LASIX) 40 MG tablet Take 40 mg by mouth 2 (two) times daily. 2 pills in AM and 1 pill in PM      . guaiFENesin (MUCINEX) 600 MG 12 hr tablet Take 600 mg by mouth 2 (two) times daily. 2 pills in AM 1 pill in PM      . letrozole (FEMARA) 2.5 MG tablet Take 1 tablet (2.5 mg total) by mouth daily.  90 tablet  12  . levalbuterol (XOPENEX HFA) 45 MCG/ACT inhaler Inhale 2 puffs into the lungs every 6 (six) hours as needed.       . Magnesium 250 MG TABS Take 1 tablet by mouth daily.        . metFORMIN (GLUCOPHAGE) 500 MG tablet Take 500 mg by mouth daily with breakfast.       . mometasone (NASONEX) 50 MCG/ACT nasal spray 2 sprays by Nasal route daily.  51 g  4  . NIACINAMIDE PO Take by mouth 2 (two) times daily.       . potassium chloride SA (K-DUR,KLOR-CON) 20 MEQ tablet       . predniSONE (DELTASONE) 10 MG tablet Take 1 tablet (10 mg total) by mouth daily. 1 daily for 4 more days then stop  50 tablet  0  . tiotropium (SPIRIVA HANDIHALER) 18 MCG inhalation capsule Place 1 capsule (18 mcg total) into inhaler and inhale daily.  90 capsule  4  . vitamin E 100 UNIT capsule Twice weekly       . warfarin (COUMADIN) 5 MG tablet Take 7.5 mg by mouth daily. Takes 7.5 mg (1 and 1/2 of 5mg  tab) by mouth daily.      Marland Kitchen DISCONTD: furosemide (LASIX) 40 MG tablet Take 1 tablet (40 mg total) by mouth 2 (two) times daily.  60 tablet  0  . DISCONTD: predniSONE (DELTASONE) 10 MG tablet Take 1 tablet (10 mg total) by mouth daily. 3 tabs bid x 2 days, 2 tabs bid x 3 days, 1 tab daily until office f/u  50 tablet  0  . atenolol (TENORMIN) 25 MG tablet Take once a day for essential tremor. If need be it can be advanced to twice a day.  180 tablet  3  . fluconazole (DIFLUCAN) 100 MG tablet Take two once then one daily until gone  6 tablet  0  . metFORMIN (GLUCOPHAGE) 500 MG tablet Take 500mg  AM  for 5 days and then advance to twice a day.  60 tablet  11  . nicotine (NICOTROL) 10 MG inhaler 60-80 puffs per cartridge, use 4 per day  120 each  2  . DISCONTD: moxifloxacin (AVELOX) 400 MG tablet  Take 1 tablet (400 mg total) by mouth daily at 6 PM.  5 tablet  0

## 2011-12-24 NOTE — Telephone Encounter (Signed)
I spoke with pt and she c/o thrush on her tongue x 4-5 days. She stated she guess she did not rinse well after the advair. She is requesting MMW to be called in ASAP if possible. She has an appt with PW this afternoon at 2:45 but wants this called in before then. Please advise Dr. Delford Field thanks  Allergies  Allergen Reactions  . Cholestatin     RAGWEED SEASON

## 2011-12-24 NOTE — Telephone Encounter (Signed)
Ok to call in #[id number]  Sig: 5-71ml  Swish gargle expectorate tid

## 2011-12-24 NOTE — Telephone Encounter (Signed)
Received escribe error.  Called [facility], spoke with [place].  Was advised RX was not received earlier.  Gave VO for this.  Shanda Bumps verbalized understanding.

## 2011-12-24 NOTE — Assessment & Plan Note (Signed)
Ongoing tobacco use as of this visit 12/24/2011 Greater than 10 minutes of smoking cessation counseling was given to this patient Will initiate Nicotrol for nicotine replacement therapy

## 2011-12-24 NOTE — Patient Instructions (Addendum)
Diflucan 100mg  Take two once then one daily until gone   Take 4 more days of prednisone 10mg  daily then stop. You will have left over medication No change on inhalers Stop smoking, use nicotrol. 60-80 puff per cartridge, use 4 cartridges per day Use magic mouth wash 5-10 ML three times per day, swish gargle, expectorate Use your flutter valve 4 times daily Flu vaccine was given Return 1 month

## 2011-12-25 ENCOUNTER — Other Ambulatory Visit: Payer: Self-pay

## 2011-12-25 MED ORDER — ATENOLOL 25 MG PO TABS: ORAL_TABLET | ORAL | Status: DC

## 2011-12-31 ENCOUNTER — Other Ambulatory Visit: Payer: Self-pay | Admitting: *Deleted

## 2011-12-31 DIAGNOSIS — C50919 Malignant neoplasm of unspecified site of unspecified female breast: Secondary | ICD-10-CM

## 2011-12-31 MED ORDER — LETROZOLE 2.5 MG PO TABS: 2.5000 mg | ORAL_TABLET | Freq: Every day | ORAL | Status: DC

## 2012-01-01 ENCOUNTER — Ambulatory Visit (INDEPENDENT_AMBULATORY_CARE_PROVIDER_SITE_OTHER): Payer: Medicare Other

## 2012-01-01 DIAGNOSIS — I6350 Cerebral infarction due to unspecified occlusion or stenosis of unspecified cerebral artery: Secondary | ICD-10-CM

## 2012-01-01 DIAGNOSIS — I4891 Unspecified atrial fibrillation: Secondary | ICD-10-CM

## 2012-01-02 ENCOUNTER — Emergency Department (HOSPITAL_COMMUNITY)
Admission: EM | Admit: 2012-01-02 | Discharge: 2012-01-02 | Disposition: A | Discharge status: Home / Self Care | Payer: Medicare Other | Attending: Emergency Medicine | Admitting: Emergency Medicine

## 2012-01-02 ENCOUNTER — Encounter (HOSPITAL_COMMUNITY): Payer: Self-pay | Admitting: Emergency Medicine

## 2012-01-02 DIAGNOSIS — M1990 Unspecified osteoarthritis, unspecified site: Secondary | ICD-10-CM | POA: Insufficient documentation

## 2012-01-02 DIAGNOSIS — D6832 Hemorrhagic disorder due to extrinsic circulating anticoagulants: Secondary | ICD-10-CM

## 2012-01-02 DIAGNOSIS — J449 Chronic obstructive pulmonary disease, unspecified: Secondary | ICD-10-CM | POA: Diagnosis not present

## 2012-01-02 DIAGNOSIS — I2510 Atherosclerotic heart disease of native coronary artery without angina pectoris: Secondary | ICD-10-CM | POA: Insufficient documentation

## 2012-01-02 DIAGNOSIS — I739 Peripheral vascular disease, unspecified: Secondary | ICD-10-CM | POA: Insufficient documentation

## 2012-01-02 DIAGNOSIS — K219 Gastro-esophageal reflux disease without esophagitis: Secondary | ICD-10-CM | POA: Diagnosis not present

## 2012-01-02 DIAGNOSIS — J4489 Other specified chronic obstructive pulmonary disease: Secondary | ICD-10-CM | POA: Insufficient documentation

## 2012-01-02 DIAGNOSIS — E785 Hyperlipidemia, unspecified: Secondary | ICD-10-CM | POA: Insufficient documentation

## 2012-01-02 DIAGNOSIS — I129 Hypertensive chronic kidney disease with stage 1 through stage 4 chronic kidney disease, or unspecified chronic kidney disease: Secondary | ICD-10-CM | POA: Insufficient documentation

## 2012-01-02 DIAGNOSIS — Z87891 Personal history of nicotine dependence: Secondary | ICD-10-CM | POA: Diagnosis not present

## 2012-01-02 DIAGNOSIS — D689 Coagulation defect, unspecified: Secondary | ICD-10-CM | POA: Insufficient documentation

## 2012-01-02 DIAGNOSIS — I509 Heart failure, unspecified: Secondary | ICD-10-CM | POA: Insufficient documentation

## 2012-01-02 DIAGNOSIS — R233 Spontaneous ecchymoses: Secondary | ICD-10-CM | POA: Diagnosis not present

## 2012-01-02 DIAGNOSIS — Z853 Personal history of malignant neoplasm of breast: Secondary | ICD-10-CM | POA: Diagnosis not present

## 2012-01-02 DIAGNOSIS — R21 Rash and other nonspecific skin eruption: Secondary | ICD-10-CM | POA: Diagnosis not present

## 2012-01-02 DIAGNOSIS — G4730 Sleep apnea, unspecified: Secondary | ICD-10-CM | POA: Diagnosis not present

## 2012-01-02 DIAGNOSIS — Z79899 Other long term (current) drug therapy: Secondary | ICD-10-CM | POA: Diagnosis not present

## 2012-01-02 DIAGNOSIS — Z950 Presence of cardiac pacemaker: Secondary | ICD-10-CM | POA: Diagnosis not present

## 2012-01-02 DIAGNOSIS — E119 Type 2 diabetes mellitus without complications: Secondary | ICD-10-CM | POA: Diagnosis not present

## 2012-01-02 LAB — PROTIME-INR
INR: 12.68 (ref 0.00–1.49)
Prothrombin Time: 83.8 seconds — ABNORMAL HIGH (ref 11.6–15.2)
Prothrombin Time: 86 seconds — ABNORMAL HIGH (ref 11.6–15.2)

## 2012-01-02 MED ORDER — PHYTONADIONE 5 MG PO TABS
5.0000 mg | ORAL_TABLET | Freq: Once | ORAL | Status: AC
  Administered 2012-01-02: 5 mg via ORAL
  Filled 2012-01-02: qty 1

## 2012-01-02 NOTE — ED Notes (Signed)
When pt was referring to her INR levels, she kept stating that her BS was high and that her BS was greater than 10; spoke with pt more in depth and realized she was confused between INR and BS level; taught pt the difference of the two levels

## 2012-01-02 NOTE — ED Notes (Addendum)
Pt reports went to coumadin clinic today, and INR was elevated; pt reports got call at 1:49 am that her INR was >10; reports has spots on legs and back; denies vomiting blood, and having blood in stool

## 2012-01-02 NOTE — ED Notes (Signed)
Patient verbalized understanding of discharge instructions.  Encouraged to return as needed for any s/sx of uncontrolled bleeding

## 2012-01-02 NOTE — ED Provider Notes (Signed)
History     CSN: [id number]  Arrival date & time 01/02/12  0218   First MD Initiated Contact with Patient 01/02/12 [phone]      Chief Complaint  Patient presents with  . Abnormal Lab    (Consider location/radiation/quality/duration/timing/severity/associated sxs/prior treatment) HPI Comments: Pt is on coumadin for atrial fibrillation, recently was hospitalized for a respiratory infection and on steroids and avelox, then developed thrush and was on antifungals.  She then developed bruising on hands and small rash on legs and back associated with some itching and went to coumadin clinic this afternoon and suspicion for elevated INR was there.  Blood work drawn at 4:30 PM, she was called at 1 AM and told INR >10 and to come to the ED.  She has no sudden HA or neck pain, no confusion, no CP, no sudden abd pain or back pain.  No N/V.  No blood seen in stool.  She was told by coumadin clinic staff to stop coumadin at least until Monday.    The history is provided by the patient and medical records.    Past Medical History  Diagnosis Date  . CVA (cerebral vascular accident)   . Sleep apnea     associated with hypersomnia  . Amiodarone pulmonary toxicity   . Renal disease     CHRONIC  . Diabetes   . Tobacco abuse   . Diastolic heart failure     Acute on Chronic  . Pacemaker     Permanent  . AF (atrial fibrillation)      AV ablation 9/09 [facility] per Dr Sampson Goon - AV node ablation 9/11 Dr Graciela Husbands  . Hyperlipidemia   . GERD (gastroesophageal reflux disease)   . [name] (coronary artery disease)     (not sure of this 11/10  . COPD (chronic obstructive pulmonary disease)     emphysema -FeV1 73% DLCO 53% 5/09  . PAD (peripheral artery disease)     w/hx right iliac/SFA stenting and left leg PTA  . Invasive ductal carcinoma of breast 2011    LEFT   . OA (osteoarthritis) of knee     RIGHT  . Right sided sciatica   . Sinoatrial node dysfunction   . CHF (congestive heart failure)     Past  Surgical History  Procedure Date  . Mastectomy, partial 02/03/2010    Left/Dr Rosenbower  . Cataract extraction, bilateral     with IOL/Dr Groat  . Breast lumpectomy     left breast    Family History  Problem Relation Age of Onset  . Heart disease Father     History  Substance Use Topics  . Smoking status: Former Smoker -- 1.0 packs/day for 55 years    Types: Cigarettes    Quit date: 12/19/2011  . Smokeless tobacco: Never Used   Comment: started smoking at age 55--4 cigs per day  . Alcohol Use: Yes     wine occasionally    OB History    Grav Para Term Preterm Abortions TAB SAB Ect Mult Living                  Review of Systems  Constitutional: Negative.   HENT: Negative for neck pain.   Respiratory: Negative for shortness of breath.   Cardiovascular: Negative for chest pain.  Gastrointestinal: Negative for abdominal pain.  Musculoskeletal: Negative for back pain.  Skin: Positive for rash.  Neurological: Negative for headaches.  All other systems reviewed and are negative.  Allergies  Cholestatin  Home Medications   Current Outpatient Rx  Name Route Sig Dispense Refill  . ADVAIR DISKUS 250-50 MCG/DOSE IN AEPB  INHALE 1 PUFF TWICE DAILY 180 each 5  . ATENOLOL 25 MG PO TABS  Take once a day for essential tremor. If need be it can be advanced to twice a day. 180 tablet 3  . ATORVASTATIN CALCIUM 20 MG PO TABS Oral Take 1 tablet (20 mg total) by mouth daily. 90 tablet 3  . CILOSTAZOL 100 MG PO TABS  TAKE 1 TABLET TWICE DAILY 180 tablet 2  . FERROUS SULFATE ER 250 MG PO CPCR Oral Take 1 capsule by mouth daily.      . FUROSEMIDE 40 MG PO TABS Oral Take 20-40 mg by mouth 2 (two) times daily. 2 pills in AM and 1 pill in PM    . GUAIFENESIN ER 600 MG PO TB12 Oral Take 600 mg by mouth 2 (two) times daily. 2 pills in AM 1 pill in PM    . LETROZOLE 2.5 MG PO TABS Oral Take 1 tablet (2.5 mg total) by mouth daily. 90 tablet 12  . LEVALBUTEROL TARTRATE 45 MCG/ACT IN AERO  Inhalation Inhale 2 puffs into the lungs every 6 (six) hours as needed.     Marland Kitchen MAGNESIUM 250 MG PO TABS Oral Take 1 tablet by mouth daily.      Marland Kitchen METFORMIN HCL 500 MG PO TABS Oral Take 500 mg by mouth daily with breakfast.     . MOMETASONE FUROATE 50 MCG/ACT NA SUSP Nasal 2 sprays by Nasal route daily. 51 g 4  . NIACINAMIDE PO Oral Take by mouth 2 (two) times daily.     Marland Kitchen NICOTINE 10 MG IN INHA  60-80 puffs per cartridge, use 4 per day 120 each 2  . POTASSIUM CHLORIDE CRYS ER 20 MEQ PO TBCR Oral Take 40-60 mEq by mouth 2 (two) times daily. Take 3 tablets every morning and take 2 tablets at night    . PREDNISONE 10 MG PO TABS Oral Take 1 tablet (10 mg total) by mouth daily. 1 daily for 4 more days then stop 50 tablet 0  . TIOTROPIUM BROMIDE MONOHYDRATE 18 MCG IN CAPS Inhalation Place 1 capsule (18 mcg total) into inhaler and inhale daily. 90 capsule 4  . VITAMIN E 100 UNITS PO CAPS Oral Take 100 Units by mouth 2 (two) times a week. Twice weekly    . WARFARIN SODIUM 5 MG PO TABS Oral Take 7.5 mg by mouth daily.     . BD MICROTAINER LANCETS MISC Subdermal 1 Device by Subdermal route daily. Use as directed one time daily 100 each 5  . DIPHENHYD-HYDROCORT-NYSTATIN MT SUSP  5-10 ML Swish and gargle expectorate 3 times daily 240 mL 0  . FLUCONAZOLE 100 MG PO TABS  Take two once then one daily until gone 6 tablet 0  . METFORMIN HCL 500 MG PO TABS  Take 500mg  AM for 5 days and then advance to twice a day. 60 tablet 11    BP 124/46  Pulse 78  Temp 97.5 F (36.4 C) (Oral)  Resp 18  SpO2 97%  Physical Exam  Nursing note and vitals reviewed. Constitutional: She is oriented to person, place, and time. She appears well-developed and well-nourished.  HENT:  Head: Normocephalic and atraumatic.  Neck: Neck supple.  Cardiovascular: Normal rate and regular rhythm.   Pulmonary/Chest: Effort normal. No respiratory distress.  Abdominal: Soft. She exhibits no distension. There is  no tenderness.    Neurological: She is alert and oriented to person, place, and time.  Skin: Skin is warm. Ecchymosis, petechiae and rash noted.       Petechiae on lower legs, small patches on back, bruising noted to hands  Psychiatric: She has a normal mood and affect.    ED Course  Procedures (including critical care time)  Labs Reviewed  GLUCOSE, CAPILLARY - Abnormal; Notable for the following:    Glucose-Capillary 180 (*)     All other components within normal limits  PROTIME-INR - Abnormal; Notable for the following:    Prothrombin Time 86.0 (*)     INR 12.68 (*)     All other components within normal limits   No results found.   1. Warfarin-induced coagulopathy   2. Petechial rash     ra sat is 97% and in interpret to be normal   4:32 AM I reviewed with pharmacist who recommends vitamin K 5 mg now.  Hold coumadin until Monday.  He will contact coordinator for the [facility] and will ensure that she has appropriate close follow up.    MDM  Will check INR, doubt sig bleeding, will need vitamin K most likely and then recheck of coumadin level closely on Monday.  Discussed return instructions such as sudden severe HA or confusion with pt and spouse.          Gavin Pound. Oletta Lamas, MD 01/02/12 [phone]

## 2012-01-02 NOTE — Discharge Instructions (Signed)
 Coagulopathy Coagulopathy (bleeding disorder) is an abnormal condition that makes it difficult for blood to clot. Platelets (specialized blood cells) and clotting factors (special blood proteins) combine to help the blood clot when a cut has been made in the skin. If platelets and clotting factors are not present in sufficient numbers, clots cannot form properly to stop bleeding.  CAUSES  Bleeding disorders usually have two causes:  Acquired bleeding disorders are due to a disease process or are drug induced, such as:  Severe liver disease.  Over treatment of Coumadin  (Warfarin).  Drug induced Immune Thrombocytopenia. Heparin is a common cause.  Bone marrow disorders.  Lupus.  Prolonged use of antibiotics may cause vitamin K deficiency.  Congenital (Inherited) bleeding disorders.  Hemophilia.  Deficiencies of clotting Factors II, V, VII, IX, X.  Von W[place]brands Disease. People with this disease are especially prone to bleeding if aspirin or NSAIDS (nonsteroidal anti-inflammatory drugs) products are taken. SYMPTOMS  Whether the coagulopathy is acquired or congenital, symptoms are usually the same:   Unusual bleeding or bleeding very easily such as:  Nosebleeds.  Bloody stools or blood in the urine.  Abnormal or very heavy menstrual bleeding.  Cuts that bleed excessively.  Excessive bruising:  Bruising very easily.  Unexplained bruising.  Dizziness. DIAGNOSIS  Coagulopathy is usually diagnosed by blood tests and a physical exam. This includes:  Clotting factor blood tests such as:  Partial Thromboplastin Time (PTT) and Prothrombin Time (PT). These tests show how long it takes your blood to clot. PTT and PT results may be high with a bleeding disorder.  Platelet counts. Platelets are a type of blood cell that help stop bleeding. The platelet count may be low with a bleeding disorder.  Fibrinogen levels. Fibrinogen is a protein produced by the liver. It helps blood  to clot. The Fibrinogen level may be low with bleeding disorder.  A physical exam may reveal bruising throughout the body.  Small petechiae (small pinpoint red or purple dots). Petechiae has a rash like appearance but does not itch. It may appear anywhere on the body, including the hands, feet, inside the mouth or even in the whites of the eyes. TREATMENT  Treatment depends on whether the coagulopathy is acquired or congenital.   If clotting factors are low (deficient), these may need to be replaced.  Platelet transfusions may need to be given.  If your caregiver has determined that a medication is the cause of the bleeding disorder, your caregiver may need to adjust or discontinue the medication.  Treatment of the underlying disease state to correct the coagulopathy.  If your Vitamin K level is low, it will need to be replaced. COMPLICATIONS OF COAGULOPATHY A bleeding disorder can develop very serious complications such as:  Bleeding into the brain (Intracerebral bleeding).  Liver failure.  Kidney failure.  Gastrointestinal bleeding. SEEK IMMEDIATE MEDICAL CARE IF:  You have large areas of bruising.  You have a severe headache that does not go away.  You have blood in your urine or stool.  You vomit blood.  You develop redness or rashes on your skin.  You become dizzy or pass out (loss of consciousness).  You develop chest pain or shortness of breath. If the chest pain or shortness of breath is severe, call your local emergency service immediately. MAKE SURE YOU:   Understand these instructions.  Will watch your condition.  Will get help right away if you are not doing well or get worse. Document Released: 12/14/2003 Document Revised: 05/18/2011  Document Reviewed: 04/05/2008 [facility] Patient Information 2013 [place], [place].      HOLD COUMADIN  THROUGH Monday, HAVE YOUR COUMADIN  LEVEL RECHECKED ON Monday FOR NEW DIRECTIONS REGARDING COUMADIN  USE.

## 2012-01-02 NOTE — ED Notes (Signed)
Patient critical lab result of INR greater than 10 has been reported to ERMD

## 2012-01-04 ENCOUNTER — Ambulatory Visit (INDEPENDENT_AMBULATORY_CARE_PROVIDER_SITE_OTHER): Payer: Medicare Other | Admitting: *Deleted

## 2012-01-04 ENCOUNTER — Other Ambulatory Visit: Payer: Self-pay | Admitting: *Deleted

## 2012-01-04 DIAGNOSIS — I6350 Cerebral infarction due to unspecified occlusion or stenosis of unspecified cerebral artery: Secondary | ICD-10-CM | POA: Diagnosis not present

## 2012-01-04 DIAGNOSIS — I4891 Unspecified atrial fibrillation: Secondary | ICD-10-CM | POA: Diagnosis not present

## 2012-01-04 LAB — POCT INR: INR: 1.2

## 2012-01-04 MED ORDER — BAYER MICROLET LANCETS MISC: Status: DC

## 2012-01-04 NOTE — Telephone Encounter (Signed)
Pt wants rx for [employer] lancets sent to [facility] is out. Rx sent, pt informed.

## 2012-01-11 ENCOUNTER — Ambulatory Visit (INDEPENDENT_AMBULATORY_CARE_PROVIDER_SITE_OTHER): Payer: Medicare Other | Admitting: *Deleted

## 2012-01-11 DIAGNOSIS — I4891 Unspecified atrial fibrillation: Secondary | ICD-10-CM | POA: Diagnosis not present

## 2012-01-11 DIAGNOSIS — I6350 Cerebral infarction due to unspecified occlusion or stenosis of unspecified cerebral artery: Secondary | ICD-10-CM

## 2012-01-18 ENCOUNTER — Encounter: Payer: Self-pay | Admitting: Critical Care Medicine

## 2012-01-18 ENCOUNTER — Ambulatory Visit (INDEPENDENT_AMBULATORY_CARE_PROVIDER_SITE_OTHER): Payer: Medicare Other | Admitting: Critical Care Medicine

## 2012-01-18 VITALS — BP 100/60 | HR 67 | Temp 97.7°F | Ht 65.0 in | Wt 160.0 lb

## 2012-01-18 DIAGNOSIS — G4733 Obstructive sleep apnea (adult) (pediatric): Secondary | ICD-10-CM

## 2012-01-18 DIAGNOSIS — F17200 Nicotine dependence, unspecified, uncomplicated: Secondary | ICD-10-CM | POA: Diagnosis not present

## 2012-01-18 DIAGNOSIS — J441 Chronic obstructive pulmonary disease with (acute) exacerbation: Secondary | ICD-10-CM | POA: Diagnosis not present

## 2012-01-18 MED ORDER — LEVALBUTEROL TARTRATE 45 MCG/ACT IN AERO: 2.0000 | INHALATION_SPRAY | Freq: Four times a day (QID) | RESPIRATORY_TRACT | Status: DC | PRN

## 2012-01-18 NOTE — Assessment & Plan Note (Signed)
Patient has been off tobacco products for one month 01/18/2012 Plan Monitor status off cigarettes Continue Nicotrol inhaler Ongoing smoking cessation counseling was administered for approximately 5 minutes at this visit

## 2012-01-18 NOTE — Patient Instructions (Addendum)
No change in medications An autoset Cpap titration will be done in the home An Overnight oxygen saturation test will be done in the home on your cpap plus oxygen Return 2 months

## 2012-01-18 NOTE — Assessment & Plan Note (Signed)
Severe sleep apnea treated with nasal CPAP at 10 cm water pressure since 2009 Patient with ongoing hypersomnia issues likely unrelated to obstructive sleep apnea Plan Repeat AutoSet CPAP titration study with download after 2 weeks Repeat overnight sleep oximetry on CPAP plus oxygen 2 L May yet need referral back to sleep medicine

## 2012-01-18 NOTE — Progress Notes (Signed)
Subjective:    Patient ID: [name], female    DOB: Jul 18, 1931, 78 y.o.   MRN: [id number]  HPI  78 y.o.   white female with history of chronic obstructive lung disease, asthmatic bronchitis, and obstructive sleep apnea. Long-term smoker with recent smoking cessation November 2013    01/18/2012 Now pt is slowly better but very sleepy during the day.  Pt had high INR at coumadin clinic.  Pt had to go to hospital for high INR  >>  No spontaneous bleeding occurred.  Since then INR is better 2.9 Mucus is still productive, uses flutter valve.  Mouth is better.  No smoking.   Pt denies any significant sore throat, nasal congestion or excess secretions, fever, chills, sweats, unintended weight loss, pleurtic or exertional chest pain, orthopnea PND, or leg swelling Pt denies any increase in rescue therapy over baseline, denies waking up needing it or having any early am or nocturnal exacerbations of coughing/wheezing/or dyspnea. Pt also denies any obvious fluctuation in symptoms with  weather or environmental change or other alleviating or aggravating factors      Past Medical History  Diagnosis Date  . CVA (cerebral vascular accident)   . Sleep apnea     associated with hypersomnia  . Amiodarone pulmonary toxicity   . Renal disease     CHRONIC  . Diabetes   . Tobacco abuse   . Diastolic heart failure     Acute on Chronic  . Pacemaker     Permanent  . AF (atrial fibrillation)      AV ablation 9/09 [facility] per Dr Sampson Goon - AV node ablation 9/11 Dr Graciela Husbands  . Hyperlipidemia   . GERD (gastroesophageal reflux disease)   . [name] (coronary artery disease)     (not sure of this 11/10  . COPD (chronic obstructive pulmonary disease)     emphysema -FeV1 73% DLCO 53% 5/09  . PAD (peripheral artery disease)     w/hx right iliac/SFA stenting and left leg PTA  . Invasive ductal carcinoma of breast 2011    LEFT   . OA (osteoarthritis) of knee     RIGHT  . Right sided sciatica   .  Sinoatrial node dysfunction   . CHF (congestive heart failure)      Family History  Problem Relation Age of Onset  . Heart disease Father      History   Social History  . Marital Status: Married    Spouse Name: N/A    Number of Children: N/A  . Years of Education: N/A   Occupational History  . Not on file.   Social History Main Topics  . Smoking status: Former Smoker -- 1.0 packs/day for 55 years    Types: Cigarettes    Quit date: 12/19/2011  . Smokeless tobacco: Never Used     Comment: started smoking at age 56--4 cigs per day  . Alcohol Use: Yes     Comment: wine occasionally  . Drug Use: No  . Sexually Active: No   Other Topics Concern  . Not on file   Social History Narrative   HS Graduate; [employer]. [employer].  Married '61.  2 sons - '70, '71; Dtrs - '64,'68; 6 grandchildren.  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 Interests -Tai-chi & water aerobics, gardening, active lifestyle.  Marriage a bit stressful - SO w/membory problems  and difficult behavior. She denies any personal safety concerns. End of life care: need to address at next OV      Allergies  Allergen Reactions  . Cholestatin     RAGWEED SEASON     Outpatient Prescriptions Prior to Visit  Medication Sig Dispense Refill  . ADVAIR DISKUS 250-50 MCG/DOSE AEPB INHALE 1 PUFF TWICE DAILY  180 each  5  . atenolol (TENORMIN) 25 MG tablet Take once a day for essential tremor. If need be it can be advanced to twice a day.  180 tablet  3  . atorvastatin (LIPITOR) 20 MG tablet Take 1 tablet (20 mg total) by mouth daily.  90 tablet  3  . BAYER MICROLET LANCETS lancets Use as instructed to check blood sugar once daily dx 250.00  100 each  3  . Ferrous Sulfate (FE-CAPS) 250 MG CPCR Take 1 capsule by mouth daily.        . furosemide (LASIX) 40 MG tablet Take  20-40 mg by mouth 2 (two) times daily. 2 pills in AM and 1 pill in PM      . guaiFENesin (MUCINEX) 600 MG 12 hr tablet Take by mouth. 2 pills in AM 1 pill in PM      . letrozole (FEMARA) 2.5 MG tablet Take 1 tablet (2.5 mg total) by mouth daily.  90 tablet  12  . Magnesium 250 MG TABS Take 1 tablet by mouth daily.        . metFORMIN (GLUCOPHAGE) 500 MG tablet Take 500 mg by mouth daily with breakfast.       . mometasone (NASONEX) 50 MCG/ACT nasal spray 2 sprays by Nasal route daily.  51 g  4  . NIACINAMIDE PO Take by mouth 2 (two) times daily.       . nicotine (NICOTROL) 10 MG inhaler 60-80 puffs per cartridge, use 4 per day  120 each  2  . potassium chloride SA (K-DUR,KLOR-CON) 20 MEQ tablet Take 40-60 mEq by mouth 2 (two) times daily. Take 3 tablets every morning and take 2 tablets at night      . tiotropium (SPIRIVA HANDIHALER) 18 MCG inhalation capsule Place 1 capsule (18 mcg total) into inhaler and inhale daily.  90 capsule  4  . vitamin E 100 UNIT capsule Take 100 Units by mouth 2 (two) times a week. Twice weekly      . warfarin (COUMADIN) 5 MG tablet Take 7.5 mg by mouth as directed.       . cilostazol (PLETAL) 100 MG tablet TAKE 1 TABLET TWICE DAILY  180 tablet  2  . levalbuterol (XOPENEX HFA) 45 MCG/ACT inhaler Inhale 2 puffs into the lungs every 6 (six) hours as needed.       . metFORMIN (GLUCOPHAGE) 500 MG tablet Take 500mg  AM for 5 days and then advance to twice a day.  60 tablet  11  . Diphenhyd-Hydrocort-Nystatin SUSP 5-10 ML Swish and gargle expectorate 3 times daily  240 mL  0     Review of Systems  Constitutional:   No  weight loss, night sweats,  Fevers, chills, fatigue, lassitude. HEENT:   No headaches,  Difficulty swallowing,  Tooth/dental problems,  Sore throat,                No sneezing, itching, ear ache, nasal congestion, post nasal drip,   CV:  No chest pain,  Orthopnea, PND, swelling in lower extremities, anasarca, dizziness, palpitations  GI  No heartburn,  indigestion, abdominal  pain, nausea, vomiting, diarrhea, change in bowel habits, loss of appetite  Resp: Notes  shortness of breath with exertion not  at rest.  No excess mucus, no  productive cough,  No non-productive cough,  No coughing up of blood.  No change in color of mucus.  No wheezing.  No chest wall deformity  Skin: no rash or lesions.  GU: no dysuria, change in color of urine, no urgency or frequency.  No flank pain.  MS:  No joint pain or swelling.  No decreased range of motion.  No back pain.  Psych:  No change in mood or affect. No depression or anxiety.  No memory loss.     Objective:   Physical Exam BP 100/60  Pulse 67  Temp 97.7 F (36.5 C) (Oral)  Ht 5\' 5"  (1.651 m)  Wt 160 lb (72.576 kg)  BMI 26.63 kg/m2  SpO2 98%  Gen: Pleasant, well-nourished, in no distress,  normal affect  ENT: No lesions,  mouth clear,  oropharynx clear, no postnasal drip  Neck: No JVD, no TMG, no carotid bruits  Lungs: No use of accessory muscles, no dullness to percussion, distant bs, no rhonchi  Cardiovascular: RRR, heart sounds normal, no murmur or gallops, no peripheral edema  Abdomen: soft and NT, no HSM,  BS normal  Musculoskeletal: No deformities, no cyanosis or clubbing  Neuro: alert, non focal  Skin: Warm, no lesions or rashes        Assessment & Plan:   NICOTINE ADDICTION Patient has been off tobacco products for one month 01/18/2012 Plan Monitor status off cigarettes Continue Nicotrol inhaler Ongoing smoking cessation counseling was administered for approximately 5 minutes at this visit  OBSTRUCTIVE SLEEP APNEA Severe sleep apnea treated with nasal CPAP at 10 cm water pressure since 2009 Patient with ongoing hypersomnia issues likely unrelated to obstructive sleep apnea Plan Repeat AutoSet CPAP titration study with download after 2 weeks Repeat overnight sleep oximetry on CPAP plus oxygen 2 L May yet need referral back to sleep medicine  COPD Gold C  with recurrent exacerbations with tobacco use Gold stage C. COPD with long-term tobacco use and now not currently smoking Frequent exacerbations with the last exacerbation requiring hospitalization October 2013 Plan Maintain inhaled medications as prescribed     Updated Medication List Outpatient Encounter Prescriptions as of 01/18/2012  Medication Sig Dispense Refill  . ADVAIR DISKUS 250-50 MCG/DOSE AEPB INHALE 1 PUFF TWICE DAILY  180 each  5  . atenolol (TENORMIN) 25 MG tablet Take once a day for essential tremor. If need be it can be advanced to twice a day.  180 tablet  3  . atorvastatin (LIPITOR) 20 MG tablet Take 1 tablet (20 mg total) by mouth daily.  90 tablet  3  . BAYER MICROLET LANCETS lancets Use as instructed to check blood sugar once daily dx 250.00  100 each  3  . cilostazol (PLETAL) 100 MG tablet       . Ferrous Sulfate (FE-CAPS) 250 MG CPCR Take 1 capsule by mouth daily.        . furosemide (LASIX) 40 MG tablet Take 20-40 mg by mouth 2 (two) times daily. 2 pills in AM and 1 pill in PM      . guaiFENesin (MUCINEX) 600 MG 12 hr tablet Take by mouth. 2 pills in AM 1 pill in PM      . letrozole (FEMARA) 2.5 MG tablet Take 1 tablet (2.5 mg total) by mouth daily.  90 tablet  12  . levalbuterol (XOPENEX HFA) 45 MCG/ACT inhaler Inhale 2 puffs into the lungs every 6 (six) hours as needed.  3 Inhaler  0  . Magnesium 250 MG TABS Take 1 tablet by mouth daily.        . metFORMIN (GLUCOPHAGE) 500 MG tablet Take 500 mg by mouth daily with breakfast.       . mometasone (NASONEX) 50 MCG/ACT nasal spray 2 sprays by Nasal route daily.  51 g  4  . NIACINAMIDE PO Take by mouth 2 (two) times daily.       . nicotine (NICOTROL) 10 MG inhaler 60-80 puffs per cartridge, use 4 per day  120 each  2  . potassium chloride SA (K-DUR,KLOR-CON) 20 MEQ tablet Take 40-60 mEq by mouth 2 (two) times daily. Take 3 tablets every morning and take 2 tablets at night      . tiotropium (SPIRIVA HANDIHALER) 18 MCG  inhalation capsule Place 1 capsule (18 mcg total) into inhaler and inhale daily.  90 capsule  4  . vitamin E 100 UNIT capsule Take 100 Units by mouth 2 (two) times a week. Twice weekly      . warfarin (COUMADIN) 5 MG tablet Take 7.5 mg by mouth as directed.       . [DISCONTINUED] cilostazol (PLETAL) 100 MG tablet TAKE 1 TABLET TWICE DAILY  180 tablet  2  . [DISCONTINUED] levalbuterol (XOPENEX HFA) 45 MCG/ACT inhaler Inhale 2 puffs into the lungs every 6 (six) hours as needed.       . metFORMIN (GLUCOPHAGE) 500 MG tablet Take 500mg  AM for 5 days and then advance to twice a day.  60 tablet  11  . [DISCONTINUED] Diphenhyd-Hydrocort-Nystatin SUSP 5-10 ML Swish and gargle expectorate 3 times daily  240 mL  0

## 2012-01-18 NOTE — Assessment & Plan Note (Signed)
Gold stage C. COPD with long-term tobacco use and now not currently smoking Frequent exacerbations with the last exacerbation requiring hospitalization October 2013 Plan Maintain inhaled medications as prescribed

## 2012-01-21 ENCOUNTER — Ambulatory Visit: Payer: Medicare Other | Admitting: Critical Care Medicine

## 2012-01-21 DIAGNOSIS — L570 Actinic keratosis: Secondary | ICD-10-CM | POA: Diagnosis not present

## 2012-01-21 DIAGNOSIS — D239 Other benign neoplasm of skin, unspecified: Secondary | ICD-10-CM | POA: Diagnosis not present

## 2012-01-21 DIAGNOSIS — D1801 Hemangioma of skin and subcutaneous tissue: Secondary | ICD-10-CM | POA: Diagnosis not present

## 2012-01-21 DIAGNOSIS — L819 Disorder of pigmentation, unspecified: Secondary | ICD-10-CM | POA: Diagnosis not present

## 2012-01-21 DIAGNOSIS — L821 Other seborrheic keratosis: Secondary | ICD-10-CM | POA: Diagnosis not present

## 2012-01-25 ENCOUNTER — Telehealth: Payer: Self-pay | Admitting: Pulmonary Disease

## 2012-01-25 DIAGNOSIS — F17200 Nicotine dependence, unspecified, uncomplicated: Secondary | ICD-10-CM

## 2012-01-25 DIAGNOSIS — G4733 Obstructive sleep apnea (adult) (pediatric): Secondary | ICD-10-CM

## 2012-01-25 NOTE — Telephone Encounter (Signed)
ATC PT NA unable to leave VM bc she is not currently accepting messages. WCB

## 2012-01-25 NOTE — Telephone Encounter (Signed)
Call the pt and tell her ONO on oxygen 2Liter plus Cpap was normal. No need to change any settings.

## 2012-01-25 NOTE — Telephone Encounter (Signed)
Pl offer her HP - 15 min slot ok

## 2012-01-25 NOTE — Telephone Encounter (Signed)
Type Date User    Provider Comments 01/18/2012 11:36 AM WRIGHT, Luisa Hart E    Note    Use [facility] for Cpap autoset T titration at home 5-20 with download    Pt needs new cpap machine.  Setting to be determined from download   -----  I spoke with pt and she stated she had this study done over the weekend. They will be faxing download over this week. Per pt she has not heard anything about getting new cpap machine. I advised pt will call [facility] and see what is going on about getting her a new cpap machine. She is also requesting to be worked in with Dr. Vassie Loll this week. She stated she is leaving for [place] on Monday morning. I advised he did not have any available openings. Pt requests to get message over to RA about work in appt.   I called Mayra Reel and she stated she is going to check into this and will give [name] a call back.  Please advise Dr. Vassie Loll thanks

## 2012-01-26 NOTE — Telephone Encounter (Signed)
Called, spoke with pt.  Informed her of below per Dr. Delford Field.  She verbalized understanding of this.  Pt states she would still like to re-establish with RA as PW is not a sleep dr.  Per RA, ok to use a 15 min slot in [place].  Pt scheduled to see RA tomorrow, Nov 20 at 11 am in [place].  She will arrive at 10:45 am.

## 2012-01-27 ENCOUNTER — Ambulatory Visit (INDEPENDENT_AMBULATORY_CARE_PROVIDER_SITE_OTHER): Payer: Medicare Other | Admitting: Pulmonary Disease

## 2012-01-27 ENCOUNTER — Encounter: Payer: Self-pay | Admitting: Pulmonary Disease

## 2012-01-27 VITALS — BP 100/68 | HR 71 | Temp 97.5°F | Ht 65.0 in | Wt 173.8 lb

## 2012-01-27 DIAGNOSIS — G4733 Obstructive sleep apnea (adult) (pediatric): Secondary | ICD-10-CM | POA: Diagnosis not present

## 2012-01-27 DIAGNOSIS — J449 Chronic obstructive pulmonary disease, unspecified: Secondary | ICD-10-CM

## 2012-01-27 NOTE — Patient Instructions (Addendum)
Will ask DME to look into whether you can get a new machine Will review download & give you feedback

## 2012-01-27 NOTE — Assessment & Plan Note (Signed)
Will ask DME to look into whether you can get a new machine Will review download & give you feedback  Weight loss encouraged, compliance with goal of at least 4-6 hrs every night is the expectation. Advised against medications with sedative side effects Cautioned against driving when sleepy - understanding that sleepiness will vary on a day to day basis

## 2012-01-27 NOTE — Progress Notes (Signed)
  Subjective:    Patient ID: [name], female    DOB: 16-Feb-1932, 78 y.o.   MRN: [id number]  HPI 78 y.o. woman with history of chronic obstructive lung disease, asthmatic bronchitis, and obstructive sleep apnea on CPAP 10 cm & 2 L o2 blended. Long-term smoker with recent smoking cessation  She also has PAD, [name], Atrial fibn s/p ablation 9/11 Last hosp 10/13 CPAP titration 1/06 showed good control on 10 cm , mouth breather, leg jerks + ONO on CPAP + 2L >> no desatn Download 5/09 good control of events on 10 cm , avg 6h/night  Last seen by me 5/09 Planning a visit to [place] for Tgiving She would like a new CPAP, 'cannot get download on this one' Mask ok, pressure ok, no dryness There is no history suggestive of cataplexy, sleep paralysis or parasomnias  Past Medical History  Diagnosis Date  . CVA (cerebral vascular accident)   . Sleep apnea     associated with hypersomnia  . Amiodarone pulmonary toxicity   . Renal disease     CHRONIC  . Diabetes   . Tobacco abuse   . Diastolic heart failure     Acute on Chronic  . Pacemaker     Permanent  . AF (atrial fibrillation)      AV ablation 9/09 [facility] per Dr Sampson Goon - AV node ablation 9/11 Dr Graciela Husbands  . Hyperlipidemia   . GERD (gastroesophageal reflux disease)   . [name] (coronary artery disease)     (not sure of this 11/10  . COPD (chronic obstructive pulmonary disease)     emphysema -FeV1 73% DLCO 53% 5/09  . PAD (peripheral artery disease)     w/hx right iliac/SFA stenting and left leg PTA  . Invasive ductal carcinoma of breast 2011    LEFT   . OA (osteoarthritis) of knee     RIGHT  . Right sided sciatica   . Sinoatrial node dysfunction   . CHF (congestive heart failure)      Review of Systems neg for any significant sore throat, dysphagia, itching, sneezing, nasal congestion or excess/ purulent secretions, fever, chills, sweats, unintended wt loss, pleuritic or exertional cp, hempoptysis, orthopnea pnd or change in  chronic leg swelling. Also denies presyncope, palpitations, heartburn, abdominal pain, nausea, vomiting, diarrhea or change in bowel or urinary habits, dysuria,hematuria, rash, arthralgias, visual complaints, headache, numbness weakness or ataxia.     Objective:   Physical Exam  Gen. Pleasant, well-nourished, in no distress ENT - no lesions, no post nasal drip Neck: No JVD, no thyromegaly, no carotid bruits Lungs: no use of accessory muscles, no dullness to percussion, clear without rales or rhonchi  Cardiovascular: Rhythm regular, heart sounds  normal, no murmurs or gallops, no peripheral edema Musculoskeletal: No deformities, no cyanosis or clubbing         Assessment & Plan:

## 2012-01-28 ENCOUNTER — Telehealth: Payer: Self-pay | Admitting: Critical Care Medicine

## 2012-01-28 ENCOUNTER — Telehealth: Payer: Self-pay | Admitting: Pulmonary Disease

## 2012-01-28 MED ORDER — NYSTATIN 100000 UNIT/ML MT SUSP: OROMUCOSAL | Status: DC

## 2012-01-28 NOTE — Telephone Encounter (Signed)
rx has been sent to the pharmacy and nothing further is needed.  

## 2012-01-28 NOTE — Telephone Encounter (Signed)
Called and spoke with family member.  The pt had just left the house.  Pt  Is requesting that MMW be called to her pharmacy.  RA please advise.  Pt was seen on 01/27/2012  Allergies  Allergen Reactions  . Cholestatin     RAGWEED SEASON

## 2012-01-28 NOTE — Telephone Encounter (Signed)
Nystatin 10k units 5ml swish & swallow once daily x 10 ds

## 2012-01-28 NOTE — Telephone Encounter (Signed)
Attempted to call pt but unable to leave a message.  Will try back later.  

## 2012-01-29 ENCOUNTER — Ambulatory Visit (INDEPENDENT_AMBULATORY_CARE_PROVIDER_SITE_OTHER): Payer: Medicare Other

## 2012-01-29 DIAGNOSIS — I4891 Unspecified atrial fibrillation: Secondary | ICD-10-CM | POA: Diagnosis not present

## 2012-01-29 DIAGNOSIS — I6350 Cerebral infarction due to unspecified occlusion or stenosis of unspecified cerebral artery: Secondary | ICD-10-CM | POA: Diagnosis not present

## 2012-01-29 MED ORDER — LEVALBUTEROL TARTRATE 45 MCG/ACT IN AERO: 2.0000 | INHALATION_SPRAY | Freq: Four times a day (QID) | RESPIRATORY_TRACT | Status: DC | PRN

## 2012-01-29 NOTE — Telephone Encounter (Signed)
Rx for xopenex HFA was sent to [facility] for the pt to be made aware

## 2012-01-29 NOTE — Assessment & Plan Note (Signed)
She has been to [place] before so hopefully she should be OK - would not require O2 - have asked her to acclimat[place]. She will take portable concentrator with her for nocturnal use.

## 2012-02-02 ENCOUNTER — Encounter: Payer: Self-pay | Admitting: Critical Care Medicine

## 2012-02-12 ENCOUNTER — Ambulatory Visit (INDEPENDENT_AMBULATORY_CARE_PROVIDER_SITE_OTHER): Payer: Medicare Other | Admitting: *Deleted

## 2012-02-12 DIAGNOSIS — I6350 Cerebral infarction due to unspecified occlusion or stenosis of unspecified cerebral artery: Secondary | ICD-10-CM

## 2012-02-12 DIAGNOSIS — I4891 Unspecified atrial fibrillation: Secondary | ICD-10-CM | POA: Diagnosis not present

## 2012-02-12 DIAGNOSIS — I495 Sick sinus syndrome: Secondary | ICD-10-CM | POA: Diagnosis not present

## 2012-02-12 LAB — POCT INR: INR: 2.9

## 2012-02-16 ENCOUNTER — Other Ambulatory Visit: Payer: Self-pay | Admitting: *Deleted

## 2012-02-16 ENCOUNTER — Telehealth: Payer: Self-pay | Admitting: *Deleted

## 2012-02-16 ENCOUNTER — Other Ambulatory Visit: Payer: Self-pay | Admitting: Internal Medicine

## 2012-02-16 MED ORDER — APIXABAN 5 MG PO TABS: 5.0000 mg | ORAL_TABLET | Freq: Two times a day (BID) | ORAL | Status: DC

## 2012-02-16 NOTE — Telephone Encounter (Signed)
Message copied by Raul Del on Tue Feb 16, 2012  3:51 PM ------      Message from: Duke Salvia      Created: Sat Feb 13, 2012  2:40 PM      Regarding: RE: Switching to new anticoagulant        This is correct  thanjks      ----- Message -----         From: Raul Del, RN         Sent: 02/12/2012   3:17 PM           To: Duke Salvia, MD      Subject: Switching to new anticoagulant                           Pt seen today in [facility], she states that you spoke with her daughter in law, Dr Eliott Nine and she is to switch to Eliquis. I did not see any documentation in her chart, is she to change therapies?

## 2012-02-16 NOTE — Telephone Encounter (Signed)
Pt meets criteria for Eliquis 5mg s BID. Labs discussed with Pharm D from 12/2011. Will schedule pt for appt 30 days after starting Eliquis. Pt due to hold coumadin today and tomorrow and come in on Thursday for INR check.  2 weeks samples and 30 day free card will be given. Pt made aware if she has time before appt on Thursday to check her cost for drug. She states she thinks she will be able to afford the drug.

## 2012-02-18 ENCOUNTER — Ambulatory Visit (INDEPENDENT_AMBULATORY_CARE_PROVIDER_SITE_OTHER): Payer: Medicare Other | Admitting: *Deleted

## 2012-02-18 DIAGNOSIS — I6350 Cerebral infarction due to unspecified occlusion or stenosis of unspecified cerebral artery: Secondary | ICD-10-CM

## 2012-02-18 DIAGNOSIS — I4891 Unspecified atrial fibrillation: Secondary | ICD-10-CM | POA: Diagnosis not present

## 2012-02-23 ENCOUNTER — Telehealth: Payer: Self-pay | Admitting: Critical Care Medicine

## 2012-02-23 MED ORDER — AZITHROMYCIN 250 MG PO TABS: ORAL_TABLET | ORAL | Status: DC

## 2012-02-23 NOTE — Telephone Encounter (Signed)
Call in azithromycin 250mg Take two once then one daily until gone  

## 2012-02-23 NOTE — Telephone Encounter (Signed)
Pt c/o prod cough for 3 days (green).  SOB is about the same.  Denies wheezing or fever.  Pt has company coming for Christmas and does not want to be sick for this.  Please advise.   Last Ov:  01-18-12   Next ov:03-14-12 Allergies  Allergen Reactions  . Cholestatin     RAGWEED SEASON

## 2012-02-23 NOTE — Telephone Encounter (Signed)
Pt notified that rx for Azithromycin was sent to pharmacy per Dr Delford Field

## 2012-02-29 ENCOUNTER — Ambulatory Visit (INDEPENDENT_AMBULATORY_CARE_PROVIDER_SITE_OTHER): Payer: Medicare Other | Admitting: Internal Medicine

## 2012-02-29 ENCOUNTER — Encounter: Payer: Self-pay | Admitting: Internal Medicine

## 2012-02-29 VITALS — BP 100/68 | HR 76 | Temp 97.9°F | Ht 65.0 in | Wt 176.2 lb

## 2012-02-29 DIAGNOSIS — B370 Candidal stomatitis: Secondary | ICD-10-CM | POA: Diagnosis not present

## 2012-02-29 MED ORDER — NYSTATIN 100000 UNIT/ML MT SUSP: OROMUCOSAL | Status: DC

## 2012-02-29 NOTE — Patient Instructions (Signed)
Thrush, Adult   Thrush is a yeast infection that develops in the mouth and throat and on the tongue. The medical term for this is oropharyngeal candidiasis, or OPC. Thrush is most common in older adults, but it can occur at any age. Thrush occurs when a yeast called candida grows out of control. Candida normally is present in small amounts in the mouth and on other mucous membranes. However, under certain circumstances, candida can grow rapidly, causing thrush. Thrush can be a recurring problem for people who have chronic illnesses or who take medications that limit the body's ability to fight infection (weakened immune system). Since these people have difficulty fighting infections, the fungus that causes thrush can spread throughout the body. This can cause life-threatening blood or organ infections.  CAUSES   Candida, the yeast that causes thrush, is normally present in small amounts in the mouth and on other mucous membranes. It usually causes no harm. However, when conditions are present that allow the yeast to grow uncontrolled, it invades surrounding tissues and becomes an infection. Thrush is most commonly caused by the yeast Candida albicans. Less often, other forms of candida can lead to thrush.  There are many types of bacteria in your mouth that normally control the growth of candida. Sometimes a new type of bacteria gets into your mouth and disrupts the balance of the germs already there. This can allow candida to overgrow. Other factors that increase your risk of developing thrush include:   An impaired ability to fight infection (weakened immune system). A normal immune system is usually strong enough to prevent candida from overgrowing.   Older adults are more likely to develop thrush because they may have weaker immune systems.   People with human immunodeficiency virus (HIV) infection have a high likelihood of developing thrush. About 90% of people with HIV develop thrush at some point during  the course of their disease.   People with diabetes are more likely to get thrush because high blood sugar levels promote overgrowth of the candida fungus.   A dry mouth (xerostomia). Dry mouth can result from overuse of mouthwashes or from certain conditions such as Sjgren's syndrome.   Pregnancy. Hormone changes during pregnancy can lead to thrush by altering the balance of bacteria in the mouth.   Poor dental care, especially in people who have false teeth.   The use of antibiotic medications. This may lead to thrush by changing the balance of bacteria in the mouth.  SYMPTOMS   Thrush can be a mild infection that causes no symptoms. If symptoms develop, they may include the following:   A burning feeling in the mouth and throat. This can occur at the start of a thrush infection.   White patches that adhere to the mouth and tongue. The tissue around the patches may be red, raw, and painful. If rubbed (during tooth brushing, for example), the patches and the tissue of the mouth may bleed easily.   A bad taste in the mouth or difficulty tasting foods.   Cottony feeling in the mouth.   Sometimes pain during eating and swallowing.  DIAGNOSIS   Your caregiver can usually diagnose thrush by exam. In addition to looking in your mouth, your caregiver will ask you questions about your health.  TREATMENT   Medications that help prevent the growth of fungi (antifungals) are the standard treatment for thrush. These medications are either applied directly to the affected area (topical) or swallowed (oral).  Mild thrush  In   adults, mild cases of thrush may clear up with simple treatment that can be done at home. This treatment usually involves using an antifungal mouth rinse or lozenges. Treatment usually lasts about 14 days.  Moderate to severe thrush   More severe thrush infections that have spread to the esophagus are treated with an oral antifungal medication. A topical antifungal medication may also be  used.   For some severe infections, a treatment period longer than 14 days may be needed.   Oral antifungal medications are almost never used during pregnancy because the fetus may be harmed. However, if a pregnant woman has a rare, severe thrush infection that has spread to her blood, oral antifungal medications may be used. In this case, the risk of harm to the mother and fetus from the severe thrush infection may be greater than the risk posed by the use of antifungal medications.  Persistent or recurrent thrush  Persistent (does not go away) or recurrent (keeps coming back) cases of thrush may:   Need to be treated twice as long as the symptoms last.   Require treatment with both oral and topical antifungal medications.   People with weakened immune systems can take an antifungal medication on a continuous basis to prevent thrush infections.  It is important to treat conditions that make you more likely to get thrush, such as diabetes, human immunodeficiency virus (HIV), or cancer.   HOME CARE INSTRUCTIONS    If you are breast-feeding, you should clean your nipples with an antifungal medication, such as nystatin (Mycostatin). Dry your nipples after breast-feeding. Applying lanolin-containing body lotion may help relieve nipple soreness.   If you wear dentures and get thrush, remove dentures before going to bed, brush them vigorously, and soak in a solution of chlorhexidine gluconate or a product such as Polident or Efferdent.   Eating plain, unflavored yogurt that contains live cultures (check the label) can also help cure thrush. Yogurt helps healthy bacteria grow in the mouth. These bacteria stop the growth of the yeast that causes thrush.   Adults can treat thrush at home with gentian violet (1%), a dye that kills bacteria and fungi. It is available without a prescription. If there is no known cause for the infection or if gentian violet does not cure the thrush, you need to see your  caregiver.  Comfort measures  Measures can be taken to reduce the discomfort of thrush:   Drink cold liquids such as water or iced tea. Eat flavored ice treats or frozen juices.   Eat foods that are easy to swallow such as gelatin, ice cream, or custard.   If the patches are painful, try drinking from a straw.   Rinse your mouth several times a day with a warm saltwater rinse. You can make the saltwater mixture with 1 tsp (5 g) of salt in 8 fl oz (0.2 L) of warm water.  PROGNOSIS    Most cases of thrush are mild and clear up with the use of an antifungal mouth rinse or lozenges. Very mild cases of thrush may clear up without medical treatment. It usually takes about 14 days of treatment with an oral antifungal medication to cure more severe thrush infections. In some cases, thrush may last several weeks even with treatment.   If thrush goes untreated and does not go away by itself, it can spread to other parts of the body.   Thrush can spread to the throat, the vagina, or the skin. It rarely spreads   to other organs of the body.  Thrush is more likely to recur (come back) in:   People who use inhaled corticosteroids to treat asthma.   People who take antibiotic medications for a long time.   People who have false teeth.   People who have a weakened immune system.  RISKS AND COMPLICATIONS  Complications related to thrush are rare in healthy people.  There are several factors that can increase your risk of developing thrush.  Age  Older adults, especially those who have serious health problems, are more likely to develop thrush because their immune systems are likely to be weaker.  Behavior   The yeast that causes thrush can be spread by oral sex.   Heavy smoking can lower the body's ability to fight off infections. This makes thrush more likely to develop.  Other conditions   False teeth (dentures), braces, or a retainer that irritates the mouth make it hard to keep the mouth clean. An unclean mouth is  more likely to develop thrush than a clean mouth.   People with a weakened immune system, such as those who have diabetes or human immunodeficiency virus (HIV) or who are undergoing chemotherapy, have an increased risk for developing thrush.  Medications  Some medications can allow the fungus that causes thrush to grow uncontrolled. Common ones are:   Antibiotics, especially those that kill a wide range of organisms (broad-spectrum antibiotics), such as tetracycline commonly can cause thrush.   Birth control pills (oral contraceptives).   Medications that weaken the body's immune system, such as corticosteroids.  Environment  Exposure over time to certain environmental chemicals, such as benzene and pesticides, can weaken the body's immune system. This increases your risk for developing infections, including thrush.  SEEK IMMEDIATE MEDICAL CARE IF:   Your symptoms are getting worse or are not improving within 7 days of starting treatment.   You have symptoms of spreading infection, such as white patches on the skin outside of the mouth.   You are nursing and you have redness and pain in the nipples in spite of home treatment or if you have burning pain in the nipple area when you nurse. Your baby's mouth should also be examined to determine whether thrush is causing your symptoms.  Document Released: 11/19/2003 Document Revised: 05/18/2011 Document Reviewed: 02/29/2008  ExitCare Patient Information 2013 [employer].

## 2012-02-29 NOTE — Progress Notes (Signed)
Subjective:    Patient ID: [name], female    DOB: 12-Apr-1931, 78 y.o.   MRN: [id number]  HPI  Pt presents to the clinic today with c/o thrush. This started 1 month ago. She was given nystatin swish and swallow  On 01/27/2012. The thrush has gotten much better but has not gone away completely. She has had problem with thrush in the past.   Review of Systems      Past Medical History  Diagnosis Date  . CVA (cerebral vascular accident)   . Sleep apnea     associated with hypersomnia  . Amiodarone pulmonary toxicity   . Renal disease     CHRONIC  . Diabetes   . Tobacco abuse   . Diastolic heart failure     Acute on Chronic  . Pacemaker     Permanent  . AF (atrial fibrillation)      AV ablation 9/09 [facility] per Dr Sampson Goon - AV node ablation 9/11 Dr Graciela Husbands  . Hyperlipidemia   . GERD (gastroesophageal reflux disease)   . [name] (coronary artery disease)     (not sure of this 11/10  . COPD (chronic obstructive pulmonary disease)     emphysema -FeV1 73% DLCO 53% 5/09  . PAD (peripheral artery disease)     w/hx right iliac/SFA stenting and left leg PTA  . Invasive ductal carcinoma of breast 2011    LEFT   . OA (osteoarthritis) of knee     RIGHT  . Right sided sciatica   . Sinoatrial node dysfunction   . CHF (congestive heart failure)     Current Outpatient Prescriptions  Medication Sig Dispense Refill  . ADVAIR DISKUS 250-50 MCG/DOSE AEPB INHALE 1 PUFF TWICE DAILY  180 each  5  . apixaban (ELIQUIS) 5 MG TABS tablet Take 1 tablet (5 mg total) by mouth 2 (two) times daily.  60 tablet  5  . atenolol (TENORMIN) 25 MG tablet Take once a day for essential tremor. If need be it can be advanced to twice a day.  180 tablet  3  . atorvastatin (LIPITOR) 20 MG tablet Take 1 tablet (20 mg total) by mouth daily.  90 tablet  3  . azithromycin (ZITHROMAX) 250 MG tablet Take 2 tablets the first day then one tablet daily until gone.  6 tablet  0  . BAYER MICROLET LANCETS lancets Use  as instructed to check blood sugar once daily dx 250.00  100 each  3  . cilostazol (PLETAL) 100 MG tablet       . Ferrous Sulfate (FE-CAPS) 250 MG CPCR Take 1 capsule by mouth daily.        . furosemide (LASIX) 40 MG tablet Take 20-40 mg by mouth 2 (two) times daily. 2 pills in AM and 1 pill in PM      . guaiFENesin (MUCINEX) 600 MG 12 hr tablet Take by mouth. 2 pills in AM 1 pill in PM      . letrozole (FEMARA) 2.5 MG tablet Take 1 tablet (2.5 mg total) by mouth daily.  90 tablet  12  . levalbuterol (XOPENEX HFA) 45 MCG/ACT inhaler Inhale 2 puffs into the lungs every 6 (six) hours as needed.  1 Inhaler  3  . Magnesium 250 MG TABS Take 1 tablet by mouth daily.        . metFORMIN (GLUCOPHAGE) 500 MG tablet Take 500mg  AM for 5 days and then advance to twice a day.  60 tablet  11  .  mometasone (NASONEX) 50 MCG/ACT nasal spray 2 sprays by Nasal route daily.  51 g  4  . NIACINAMIDE PO Take by mouth 2 (two) times daily.       . nicotine (NICOTROL) 10 MG inhaler 60-80 puffs per cartridge, use 4 per day  120 each  2  . nystatin (MYCOSTATIN) 100000 UNIT/ML suspension 5 mls swish and swallow daily x 10 days  60 mL  1  . potassium chloride SA (K-DUR,KLOR-CON) 20 MEQ tablet TAKE 2 TABLETS EVERY MORNING  AND TAKE 3 TABLETS EVERY EVENING  60 tablet  6  . tiotropium (SPIRIVA HANDIHALER) 18 MCG inhalation capsule Place 1 capsule (18 mcg total) into inhaler and inhale daily.  90 capsule  4  . vitamin E 100 UNIT capsule Take 100 Units by mouth 2 (two) times a week. Twice weekly      . warfarin (COUMADIN) 5 MG tablet Take 7.5 mg by mouth as directed.         Allergies  Allergen Reactions  . Cholestatin     RAGWEED SEASON    Family History  Problem Relation Age of Onset  . Heart disease Father     History   Social History  . Marital Status: Married    Spouse Name: N/A    Number of Children: N/A  . Years of Education: N/A   Occupational History  . Not on file.   Social History Main Topics  .  Smoking status: Former Smoker -- 1.0 packs/day for 55 years    Types: Cigarettes    Quit date: 12/19/2011  . Smokeless tobacco: Never Used     Comment: started smoking at age 52--4 cigs per day  . Alcohol Use: Yes     Comment: wine occasionally  . Drug Use: No  . Sexually Active: No   Other Topics Concern  . Not on file   Social History Narrative   HS Graduate; [employer]. [employer].  Married '61.  2 sons - '70, '71; Dtrs - '64,'68; 6 grandchildren.  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 Interests -Tai-chi & water aerobics, gardening, active lifestyle.  Marriage a bit stressful - SO w/membory problems and difficult behavior. She denies any personal safety concerns. End of life care: need to address at next OV      Constitutional: Denies fever, malaise, fatigue, headache or abrupt weight changes.  HEENT: Pt reports a thick white coating on her tongue. Denies eye pain, eye redness, ear pain, ringing in the ears, wax buildup, runny nose, nasal congestion, bloody nose, or sore throat. Respiratory: Denies difficulty breathing, shortness of breath, cough or sputum production.   Skin: Denies redness, rashes, lesions or ulcercations.   No other specific complaints in a complete review of systems (except as listed in HPI above).  Objective:   Physical Exam   BP 100/68  Pulse 76  Temp 97.9 F (36.6 C) (Oral)  Ht 5\' 5"  (1.651 m)  Wt 176 lb 4 oz (79.946 kg)  BMI 29.33 kg/m2  SpO2 94% Wt Readings from Last 3 Encounters:  02/29/12 176 lb 4 oz (79.946 kg)  01/27/12 173 lb 12.8 oz (78.835 kg)  01/18/12 160 lb (72.576 kg)    General: Appears her stated age, well developed, well nourished in NAD. HEENT: Head: normal shape and size; Eyes: sclera white, no icterus, conjunctiva pink, PERRLA and EOMs intact; Ears:  Tm's gray and intact,  normal light reflex; Nose: mucosa pink and moist, septum midline; Throat/Mouth: Teeth present, mucosa pink and moist, no exudate, lesions or ulcerations noted. Thrush present on the tongue. Neck: Normal range of motion. Neck supple, trachea midline. No massses, lumps or thyromegaly present.  Cardiovascular: Normal rate and rhythm. S1,S2 noted.  No murmur, rubs or gallops noted. No JVD or BLE edema. No carotid bruits noted. Pulmonary/Chest: Normal effort and positive vesicular breath sounds. No respiratory distress. No wheezes, rales or ronchi noted.        Assessment & Plan:   Thrush, new onset with additional workup required:  Will give Nystatin swish and spit Rinse mouth out after using advair  RTC as needed or if symptoms persist

## 2012-03-17 ENCOUNTER — Ambulatory Visit (INDEPENDENT_AMBULATORY_CARE_PROVIDER_SITE_OTHER): Payer: Medicare Other | Admitting: *Deleted

## 2012-03-17 DIAGNOSIS — I4891 Unspecified atrial fibrillation: Secondary | ICD-10-CM

## 2012-03-17 DIAGNOSIS — I6350 Cerebral infarction due to unspecified occlusion or stenosis of unspecified cerebral artery: Secondary | ICD-10-CM | POA: Diagnosis not present

## 2012-03-17 LAB — BASIC METABOLIC PANEL
BUN: 18 mg/dL (ref 6–23)
Creatinine, Ser: 0.9 mg/dL (ref 0.4–1.2)
GFR: 61.85 mL/min (ref >60.00)
Glucose, Bld: 141 mg/dL — ABNORMAL HIGH (ref 70–99)
Potassium: 3.9 mEq/L (ref 3.5–5.1)

## 2012-03-17 LAB — CBC
HCT: 40.3 % (ref 36.0–46.0)
Hemoglobin: 13.4 g/dL (ref 12.0–15.0)
MCHC: 33.2 g/dL (ref 30.0–36.0)
MCV: 89.6 fl (ref 78.0–100.0)
Platelets: 212 10*3/uL (ref 150.0–400.0)
RBC: 4.5 Mil/uL (ref 3.87–5.11)

## 2012-03-18 MED ORDER — APIXABAN 5 MG PO TABS: 5.0000 mg | ORAL_TABLET | Freq: Two times a day (BID) | ORAL | Status: DC

## 2012-03-18 NOTE — Progress Notes (Signed)
Pt was started on Eliquis 5mg s BID for Afib, started on 02/18/12.    Reviewed patients medication list.  Pt is not currently on any combined P-gp and strong CYP3A4 inhibitors/inducers (ketoconazole, traconazole, ritonavir, carbamazepine, phenytoin, rifampin, St. John's wort).  Reviewed labs.  SCr 0.9, Weight 79.94Kg , CrCl- 65.   Dose appropriate based on CrCl.   Hgb and HCT WNL at 13.4/40.3 .   A full discussion of the nature of anticoagulants has been carried out.  A benefit/risk analysis has been presented to the patient, so that they understand the justification for choosing anticoagulation with Xarelto at this time.  The need for compliance is stressed.  Pt is aware to take the medication once daily with the largest meal of the day.  Side effects of potential bleeding are discussed, including unusual colored urine or stools, coughing up blood or coffee ground emesis, nose bleeds or serious fall or head trauma.  Discussed signs and symptoms of stroke. The patient should avoid any OTC items containing aspirin or ibuprofen.  Avoid alcohol consumption.   Call if any signs of abnormal bleeding.  Discussed financial obligations and resolved any difficulty in obtaining medication.  Next lab test test in 6 months.

## 2012-03-23 ENCOUNTER — Ambulatory Visit (INDEPENDENT_AMBULATORY_CARE_PROVIDER_SITE_OTHER): Payer: Medicare Other | Admitting: Internal Medicine

## 2012-03-23 ENCOUNTER — Encounter: Payer: Self-pay | Admitting: Internal Medicine

## 2012-03-23 VITALS — BP 106/64 | HR 73 | Temp 97.5°F | Resp 12 | Wt 180.0 lb

## 2012-03-23 DIAGNOSIS — B370 Candidal stomatitis: Secondary | ICD-10-CM

## 2012-03-23 MED ORDER — MAGIC MOUTHWASH W/LIDOCAINE: 5.0000 mL | Freq: Four times a day (QID) | ORAL | Status: DC

## 2012-03-23 NOTE — Patient Instructions (Addendum)
Thrush - looks mild with no plaque on the tongue or buccal membrane. Plan - magic mouthwash with lidocaine: 5 cc swish,gargle and spit 4 times a day.

## 2012-03-23 NOTE — Progress Notes (Signed)
Subjective:    Patient ID: [name], female    DOB: Dec 02, 1931, 79 y.o.   MRN: [id number]  HPI [name] presents for retreatment of oral thrush for which she was treated November 20th and December 23rd. She has been treated with nystatin suspension swish,gargle spit once a day. She does use both Advair and Nasonex - risk factors. She has recurrent soreness of the mouth.  PMH, FamHx and SocHx reviewed for any changes and relevance. Current Outpatient Prescriptions on File Prior to Visit  Medication Sig Dispense Refill  . ADVAIR DISKUS 250-50 MCG/DOSE AEPB INHALE 1 PUFF TWICE DAILY  180 each  5  . apixaban (ELIQUIS) 5 MG TABS tablet Take 1 tablet (5 mg total) by mouth 2 (two) times daily.  180 tablet  3  . atenolol (TENORMIN) 25 MG tablet Take once a day for essential tremor. If need be it can be advanced to twice a day.  180 tablet  3  . atorvastatin (LIPITOR) 20 MG tablet Take 1 tablet (20 mg total) by mouth daily.  90 tablet  3  . BAYER MICROLET LANCETS lancets Use as instructed to check blood sugar once daily dx 250.00  100 each  3  . cilostazol (PLETAL) 100 MG tablet       . Ferrous Sulfate (FE-CAPS) 250 MG CPCR Take 1 capsule by mouth daily.        . furosemide (LASIX) 40 MG tablet Take 20-40 mg by mouth 2 (two) times daily. 2 pills in AM and 1 pill in PM      . guaiFENesin (MUCINEX) 600 MG 12 hr tablet Take by mouth. 2 pills in AM 1 pill in PM      . letrozole (FEMARA) 2.5 MG tablet Take 1 tablet (2.5 mg total) by mouth daily.  90 tablet  12  . levalbuterol (XOPENEX HFA) 45 MCG/ACT inhaler Inhale 2 puffs into the lungs every 6 (six) hours as needed.  1 Inhaler  3  . Magnesium 250 MG TABS Take 1 tablet by mouth daily.        . metFORMIN (GLUCOPHAGE) 500 MG tablet Take 500mg  AM for 5 days and then advance to twice a day.      . mometasone (NASONEX) 50 MCG/ACT nasal spray 2 sprays by Nasal route daily.  51 g  4  . NIACINAMIDE PO Take by mouth 2 (two) times daily.       .  nicotine (NICOTROL) 10 MG inhaler 60-80 puffs per cartridge, use 4 per day  120 each  2  . nystatin (MYCOSTATIN) 100000 UNIT/ML suspension 5 mls swish and swallow daily x 10 days  60 mL  1  . potassium chloride SA (K-DUR,KLOR-CON) 20 MEQ tablet TAKE 2 TABLETS EVERY MORNING  AND TAKE 3 TABLETS EVERY EVENING  60 tablet  6  . tiotropium (SPIRIVA HANDIHALER) 18 MCG inhalation capsule Place 1 capsule (18 mcg total) into inhaler and inhale daily.  90 capsule  4  . vitamin E 100 UNIT capsule Take 100 Units by mouth 2 (two) times a week. Twice weekly          Review of Systems System review is negative for any constitutional, cardiac, pulmonary, GI or neuro symptoms or complaints other than as described in the HPI.     Objective:   Physical Exam Filed Vitals:   03/23/12 1138  BP: 106/64  Pulse: 73  Temp: 97.5 F (36.4 C)  Resp: 12   gen'l - WNWD white woman  in no distress HEENT - tongue is normal color, papillae are flattened, buccal membranes are normal Cor- RRR PUlm - shallow inspirations, no wheezing       Assessment & Plan:  Thrush - looks mild with no plaque on the tongue or buccal membrane. Plan - magic mouthwash with lidocaine: 5 cc swish,gargle and spit 4 times a day.

## 2012-03-24 ENCOUNTER — Ambulatory Visit: Payer: Medicare Other | Admitting: Critical Care Medicine

## 2012-03-30 DIAGNOSIS — E119 Type 2 diabetes mellitus without complications: Secondary | ICD-10-CM | POA: Diagnosis not present

## 2012-03-30 DIAGNOSIS — H04129 Dry eye syndrome of unspecified lacrimal gland: Secondary | ICD-10-CM | POA: Diagnosis not present

## 2012-03-30 DIAGNOSIS — Z961 Presence of intraocular lens: Secondary | ICD-10-CM | POA: Diagnosis not present

## 2012-04-04 ENCOUNTER — Encounter: Payer: Self-pay | Admitting: Critical Care Medicine

## 2012-04-04 ENCOUNTER — Ambulatory Visit (INDEPENDENT_AMBULATORY_CARE_PROVIDER_SITE_OTHER): Payer: Medicare Other | Admitting: Critical Care Medicine

## 2012-04-04 VITALS — BP 108/66 | HR 82 | Temp 97.9°F | Ht 65.0 in | Wt 183.0 lb

## 2012-04-04 DIAGNOSIS — G4733 Obstructive sleep apnea (adult) (pediatric): Secondary | ICD-10-CM

## 2012-04-04 DIAGNOSIS — J449 Chronic obstructive pulmonary disease, unspecified: Secondary | ICD-10-CM | POA: Diagnosis not present

## 2012-04-04 DIAGNOSIS — F17200 Nicotine dependence, unspecified, uncomplicated: Secondary | ICD-10-CM

## 2012-04-04 NOTE — Assessment & Plan Note (Signed)
No smoking since November 2013

## 2012-04-04 NOTE — Assessment & Plan Note (Signed)
Stable obstructive sleep apnea currently on CPAP 10 cmh20  +2 L oxygen

## 2012-04-04 NOTE — Progress Notes (Signed)
Subjective:    Patient ID: [name], female    DOB: May 30, 1931, 79 y.o.   MRN: [id number]  HPI  79 y.o.   white female with history of chronic obstructive lung disease, asthmatic bronchitis, and obstructive sleep apnea. Long-term smoker with recent smoking cessation November 2013  04/04/2012 Since last OV :  Still thrush persists in the mouth.  No smoking.  Weight is up.  Using cpap.  No real mucus.  No wheeze. No chest pain.  eliquis is now being used instead of coumadin. Pt denies any significant sore throat, nasal congestion or excess secretions, fever, chills, sweats, unintended weight loss, pleurtic or exertional chest pain, orthopnea PND, or leg swelling Pt denies any increase in rescue therapy over baseline, denies waking up needing it or having any early am or nocturnal exacerbations of coughing/wheezing/or dyspnea. Pt also denies any obvious fluctuation in symptoms with  weather or environmental change or other alleviating or aggravating factors  Past Medical History  Diagnosis Date  . CVA (cerebral vascular accident)   . Sleep apnea     associated with hypersomnia  . Amiodarone pulmonary toxicity   . Renal disease     CHRONIC  . Diabetes   . Tobacco abuse   . Diastolic heart failure     Acute on Chronic  . Pacemaker     Permanent  . AF (atrial fibrillation)      AV ablation 9/09 [facility] per Dr Sampson Goon - AV node ablation 9/11 Dr Graciela Husbands  . Hyperlipidemia   . GERD (gastroesophageal reflux disease)   . [name] (coronary artery disease)     (not sure of this 11/10  . COPD (chronic obstructive pulmonary disease)     emphysema -FeV1 73% DLCO 53% 5/09  . PAD (peripheral artery disease)     w/hx right iliac/SFA stenting and left leg PTA  . Invasive ductal carcinoma of breast 2011    LEFT   . OA (osteoarthritis) of knee     RIGHT  . Right sided sciatica   . Sinoatrial node dysfunction   . CHF (congestive heart failure)      Family History  Problem Relation Age of  Onset  . Heart disease Father      History   Social History  . Marital Status: Married    Spouse Name: N/A    Number of Children: N/A  . Years of Education: N/A   Occupational History  . Not on file.   Social History Main Topics  . Smoking status: Former Smoker -- 1.0 packs/day for 55 years    Types: Cigarettes    Quit date: 12/19/2011  . Smokeless tobacco: Never Used     Comment: started smoking at age 27--4 cigs per day  . Alcohol Use: Yes     Comment: wine occasionally  . Drug Use: No  . Sexually Active: No   Other Topics Concern  . Not on file   Social History Narrative   HS Graduate; [employer]. [employer].  Married '61.  2 sons - '70, '71; Dtrs - '64,'68; 6 grandchildren.  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 Interests -Tai-chi & water aerobics, gardening, active lifestyle.  Marriage a bit stressful - SO w/membory problems and difficult behavior. She denies any personal safety concerns. End of life care: need to address at next OV      Allergies  Allergen Reactions  . Cholestatin     RAGWEED SEASON     Outpatient Prescriptions Prior to Visit  Medication Sig Dispense Refill  . ADVAIR DISKUS 250-50 MCG/DOSE AEPB INHALE 1 PUFF TWICE DAILY  180 each  5  . apixaban (ELIQUIS) 5 MG TABS tablet Take 1 tablet (5 mg total) by mouth 2 (two) times daily.  180 tablet  3  . atenolol (TENORMIN) 25 MG tablet Take once a day for essential tremor. If need be it can be advanced to twice a day.  180 tablet  3  . atorvastatin (LIPITOR) 20 MG tablet Take 1 tablet (20 mg total) by mouth daily.  90 tablet  3  . BAYER MICROLET LANCETS lancets Use as instructed to check blood sugar once daily dx 250.00  100 each  3  . cilostazol (PLETAL) 100 MG tablet Take 100 mg by mouth at bedtime. And again after breakfast if remembers      . Ferrous  Sulfate (FE-CAPS) 250 MG CPCR Take 1 capsule by mouth daily.        . furosemide (LASIX) 40 MG tablet Take 20-40 mg by mouth 2 (two) times daily. 2 pills in AM and 1 pill in PM      . guaiFENesin (MUCINEX) 600 MG 12 hr tablet Take by mouth. 2 pills in AM 1 pill in PM      . letrozole (FEMARA) 2.5 MG tablet Take 1 tablet (2.5 mg total) by mouth daily.  90 tablet  12  . levalbuterol (XOPENEX HFA) 45 MCG/ACT inhaler Inhale 2 puffs into the lungs every 6 (six) hours as needed.  1 Inhaler  3  . Magnesium 250 MG TABS Take 1 tablet by mouth daily.        . metFORMIN (GLUCOPHAGE) 500 MG tablet Take 500mg  AM for 5 days and then advance to twice a day.      . mometasone (NASONEX) 50 MCG/ACT nasal spray 2 sprays by Nasal route daily.  51 g  4  . NIACINAMIDE PO Take by mouth 2 (two) times daily.       . nicotine (NICOTROL) 10 MG inhaler 60-80 puffs per cartridge, use 4 per day  120 each  2  . potassium chloride SA (K-DUR,KLOR-CON) 20 MEQ tablet TAKE 2 TABLETS EVERY MORNING  AND TAKE 3 TABLETS EVERY EVENING  60 tablet  6  . tiotropium (SPIRIVA HANDIHALER) 18 MCG inhalation capsule Place 1 capsule (18 mcg total) into inhaler and inhale daily.  90 capsule  4  . vitamin E 100 UNIT capsule Take 100 Units by mouth 2 (two) times a week. Twice weekly      . [DISCONTINUED] Alum & Mag Hydroxide-Simeth (MAGIC MOUTHWASH W/LIDOCAINE) SOLN Take 5 mLs by mouth 4 (four) times daily. Swish gargle and spit  250 mL  2  . [DISCONTINUED] nystatin (MYCOSTATIN) 100000 UNIT/ML suspension 5 mls swish and swallow daily x 10 days  60 mL  1  Last reviewed on 04/04/2012 10:30 AM by Storm Frisk, MD   Review of Systems  Constitutional:   No  weight loss, night sweats,  Fevers, chills, fatigue, lassitude. HEENT:   No headaches,  Difficulty swallowing,  Tooth/dental problems,  Sore throat,                No sneezing, itching, ear ache, nasal congestion, post nasal drip,   CV:  No chest pain,  Orthopnea, PND, swelling in lower  extremities, anasarca, dizziness, palpitations  GI  No heartburn,  indigestion, abdominal pain, nausea, vomiting, diarrhea, change in bowel habits, loss of appetite  Resp: Notes  shortness of breath with exertion not  at rest.  No excess mucus, no  productive cough,  No non-productive cough,  No coughing up of blood.  No change in color of mucus.  No wheezing.  No chest wall deformity  Skin: no rash or lesions.  GU: no dysuria, change in color of urine, no urgency or frequency.  No flank pain.  MS:  No joint pain or swelling.  No decreased range of motion.  No back pain.  Psych:  No change in mood or affect. No depression or anxiety.  No memory loss.     Objective:   Physical Exam BP 108/66  Pulse 82  Temp 97.9 F (36.6 C) (Oral)  Ht 5\' 5"  (1.651 m)  Wt 183 lb (83.008 kg)  BMI 30.45 kg/m2  SpO2 93%  Gen: Pleasant, well-nourished, in no distress,  normal affect  ENT: No lesions,  mouth clear,  oropharynx clear, no postnasal drip  Neck: No JVD, no TMG, no carotid bruits  Lungs: No use of accessory muscles, no dullness to percussion, distant bs, no rhonchi  Cardiovascular: RRR, heart sounds normal, no murmur or gallops, no peripheral edema  Abdomen: soft and NT, no HSM,  BS normal  Musculoskeletal: No deformities, no cyanosis or clubbing  Neuro: alert, non focal  Skin: Warm, no lesions or rashes        Assessment & Plan:   Gold stage C. COPD with frequent exacerbations Gold stage C. COPD former smoker now off cigarettes since November 2013 Lung process stable at this time Plan Maintain CPAP with oxygen at night Maintain Spiriva and Advair Return 4 months  NICOTINE ADDICTION No smoking since November 2013  OBSTRUCTIVE SLEEP APNEA Stable obstructive sleep apnea currently on CPAP 10 cmh20  +2 L oxygen    Updated Medication List Outpatient Encounter Prescriptions as of 04/04/2012  Medication Sig Dispense Refill  . ADVAIR DISKUS 250-50 MCG/DOSE AEPB INHALE 1  PUFF TWICE DAILY  180 each  5  . Alum & Mag Hydroxide-Simeth (MAGIC MOUTHWASH W/LIDOCAINE) SOLN Take 5 mLs by mouth 4 (four) times daily as needed. Swish gargle and spit      . apixaban (ELIQUIS) 5 MG TABS tablet Take 1 tablet (5 mg total) by mouth 2 (two) times daily.  180 tablet  3  . Ascorbic Acid (VITAMIN C PO) Take by mouth. Three times weekly      . atenolol (TENORMIN) 25 MG tablet Take once a day for essential tremor. If need be it can be advanced to twice a day.  180 tablet  3  . atorvastatin (LIPITOR) 20 MG tablet Take 1 tablet (20 mg total) by mouth daily.  90 tablet  3  . BAYER MICROLET LANCETS lancets Use as instructed to check blood sugar once daily dx 250.00  100 each  3  . cilostazol (PLETAL) 100 MG tablet Take 100 mg by mouth at bedtime. And again after breakfast if remembers      . Ferrous Sulfate (FE-CAPS) 250 MG CPCR Take 1 capsule by mouth daily.        . furosemide (LASIX) 40 MG tablet Take 20-40 mg by mouth 2 (two) times daily. 2 pills in AM and 1 pill in PM      . guaiFENesin (MUCINEX) 600 MG 12 hr tablet Take by mouth. 2 pills in AM 1 pill in PM      .  letrozole (FEMARA) 2.5 MG tablet Take 1 tablet (2.5 mg total) by mouth daily.  90 tablet  12  . levalbuterol (XOPENEX HFA) 45 MCG/ACT inhaler Inhale 2 puffs into the lungs every 6 (six) hours as needed.  1 Inhaler  3  . Magnesium 250 MG TABS Take 1 tablet by mouth daily.        . metFORMIN (GLUCOPHAGE) 500 MG tablet Take 500mg  AM for 5 days and then advance to twice a day.      . mometasone (NASONEX) 50 MCG/ACT nasal spray 2 sprays by Nasal route daily.  51 g  4  . NIACINAMIDE PO Take by mouth 2 (two) times daily.       . nicotine (NICOTROL) 10 MG inhaler 60-80 puffs per cartridge, use 4 per day  120 each  2  . potassium chloride SA (K-DUR,KLOR-CON) 20 MEQ tablet TAKE 2 TABLETS EVERY MORNING  AND TAKE 3 TABLETS EVERY EVENING  60 tablet  6  . tiotropium (SPIRIVA HANDIHALER) 18 MCG inhalation capsule Place 1 capsule (18 mcg  total) into inhaler and inhale daily.  90 capsule  4  . vitamin E 100 UNIT capsule Take 100 Units by mouth 2 (two) times a week. Twice weekly      . [DISCONTINUED] Alum & Mag Hydroxide-Simeth (MAGIC MOUTHWASH W/LIDOCAINE) SOLN Take 5 mLs by mouth 4 (four) times daily. Swish gargle and spit  250 mL  2  . [DISCONTINUED] nystatin (MYCOSTATIN) 100000 UNIT/ML suspension 5 mls swish and swallow daily x 10 days  60 mL  1

## 2012-04-04 NOTE — Assessment & Plan Note (Signed)
Gold stage C. COPD former smoker now off cigarettes since November 2013 Lung process stable at this time Plan Maintain CPAP with oxygen at night Maintain Spiriva and Advair Return 4 months

## 2012-04-04 NOTE — Patient Instructions (Signed)
No change in medications. Return in         4 months 

## 2012-04-08 DIAGNOSIS — I10 Essential (primary) hypertension: Secondary | ICD-10-CM | POA: Diagnosis not present

## 2012-04-08 DIAGNOSIS — E213 Hyperparathyroidism, unspecified: Secondary | ICD-10-CM | POA: Diagnosis not present

## 2012-04-08 DIAGNOSIS — N182 Chronic kidney disease, stage 2 (mild): Secondary | ICD-10-CM | POA: Diagnosis not present

## 2012-04-08 DIAGNOSIS — R809 Proteinuria, unspecified: Secondary | ICD-10-CM | POA: Diagnosis not present

## 2012-04-08 DIAGNOSIS — I129 Hypertensive chronic kidney disease with stage 1 through stage 4 chronic kidney disease, or unspecified chronic kidney disease: Secondary | ICD-10-CM | POA: Diagnosis not present

## 2012-04-08 DIAGNOSIS — D649 Anemia, unspecified: Secondary | ICD-10-CM | POA: Diagnosis not present

## 2012-04-14 ENCOUNTER — Encounter (HOSPITAL_COMMUNITY): Payer: Self-pay

## 2012-04-14 ENCOUNTER — Emergency Department (HOSPITAL_COMMUNITY): Payer: Medicare Other

## 2012-04-14 ENCOUNTER — Observation Stay (HOSPITAL_COMMUNITY): Payer: Medicare Other

## 2012-04-14 ENCOUNTER — Inpatient Hospital Stay (HOSPITAL_COMMUNITY)
Admission: EM | Admit: 2012-04-14 | Discharge: 2012-04-27 | DRG: 516 | Disposition: A | Discharge status: Skilled Nursing Facility | Payer: Medicare Other | Attending: Internal Medicine | Admitting: Internal Medicine

## 2012-04-14 DIAGNOSIS — I739 Peripheral vascular disease, unspecified: Secondary | ICD-10-CM | POA: Diagnosis present

## 2012-04-14 DIAGNOSIS — K219 Gastro-esophageal reflux disease without esophagitis: Secondary | ICD-10-CM | POA: Diagnosis present

## 2012-04-14 DIAGNOSIS — S42209A Unspecified fracture of upper end of unspecified humerus, initial encounter for closed fracture: Secondary | ICD-10-CM | POA: Diagnosis not present

## 2012-04-14 DIAGNOSIS — E118 Type 2 diabetes mellitus with unspecified complications: Secondary | ICD-10-CM | POA: Diagnosis present

## 2012-04-14 DIAGNOSIS — S4980XA Other specified injuries of shoulder and upper arm, unspecified arm, initial encounter: Secondary | ICD-10-CM | POA: Diagnosis not present

## 2012-04-14 DIAGNOSIS — Z853 Personal history of malignant neoplasm of breast: Secondary | ICD-10-CM | POA: Diagnosis not present

## 2012-04-14 DIAGNOSIS — J449 Chronic obstructive pulmonary disease, unspecified: Secondary | ICD-10-CM | POA: Diagnosis not present

## 2012-04-14 DIAGNOSIS — S79919A Unspecified injury of unspecified hip, initial encounter: Secondary | ICD-10-CM | POA: Diagnosis not present

## 2012-04-14 DIAGNOSIS — S32409A Unspecified fracture of unspecified acetabulum, initial encounter for closed fracture: Principal | ICD-10-CM | POA: Diagnosis present

## 2012-04-14 DIAGNOSIS — F17200 Nicotine dependence, unspecified, uncomplicated: Secondary | ICD-10-CM

## 2012-04-14 DIAGNOSIS — I509 Heart failure, unspecified: Secondary | ICD-10-CM | POA: Diagnosis present

## 2012-04-14 DIAGNOSIS — F0280 Dementia in other diseases classified elsewhere without behavioral disturbance: Secondary | ICD-10-CM | POA: Diagnosis not present

## 2012-04-14 DIAGNOSIS — D62 Acute posthemorrhagic anemia: Secondary | ICD-10-CM | POA: Diagnosis not present

## 2012-04-14 DIAGNOSIS — M25559 Pain in unspecified hip: Secondary | ICD-10-CM | POA: Diagnosis not present

## 2012-04-14 DIAGNOSIS — R627 Adult failure to thrive: Secondary | ICD-10-CM | POA: Diagnosis not present

## 2012-04-14 DIAGNOSIS — K567 Ileus, unspecified: Secondary | ICD-10-CM

## 2012-04-14 DIAGNOSIS — J4489 Other specified chronic obstructive pulmonary disease: Secondary | ICD-10-CM | POA: Diagnosis not present

## 2012-04-14 DIAGNOSIS — S52609A Unspecified fracture of lower end of unspecified ulna, initial encounter for closed fracture: Secondary | ICD-10-CM | POA: Diagnosis present

## 2012-04-14 DIAGNOSIS — F29 Unspecified psychosis not due to a substance or known physiological condition: Secondary | ICD-10-CM | POA: Diagnosis not present

## 2012-04-14 DIAGNOSIS — S42213A Unspecified displaced fracture of surgical neck of unspecified humerus, initial encounter for closed fracture: Secondary | ICD-10-CM | POA: Diagnosis not present

## 2012-04-14 DIAGNOSIS — E785 Hyperlipidemia, unspecified: Secondary | ICD-10-CM | POA: Diagnosis not present

## 2012-04-14 DIAGNOSIS — R6889 Other general symptoms and signs: Secondary | ICD-10-CM | POA: Diagnosis not present

## 2012-04-14 DIAGNOSIS — M6281 Muscle weakness (generalized): Secondary | ICD-10-CM | POA: Diagnosis not present

## 2012-04-14 DIAGNOSIS — F411 Generalized anxiety disorder: Secondary | ICD-10-CM | POA: Diagnosis not present

## 2012-04-14 DIAGNOSIS — S62309A Unspecified fracture of unspecified metacarpal bone, initial encounter for closed fracture: Secondary | ICD-10-CM | POA: Diagnosis not present

## 2012-04-14 DIAGNOSIS — R410 Disorientation, unspecified: Secondary | ICD-10-CM

## 2012-04-14 DIAGNOSIS — M1710 Unilateral primary osteoarthritis, unspecified knee: Secondary | ICD-10-CM | POA: Diagnosis present

## 2012-04-14 DIAGNOSIS — S8000XA Contusion of unspecified knee, initial encounter: Secondary | ICD-10-CM | POA: Diagnosis present

## 2012-04-14 DIAGNOSIS — I5033 Acute on chronic diastolic (congestive) heart failure: Secondary | ICD-10-CM

## 2012-04-14 DIAGNOSIS — I10 Essential (primary) hypertension: Secondary | ICD-10-CM | POA: Diagnosis not present

## 2012-04-14 DIAGNOSIS — N2581 Secondary hyperparathyroidism of renal origin: Secondary | ICD-10-CM | POA: Diagnosis present

## 2012-04-14 DIAGNOSIS — S62233A Other displaced fracture of base of first metacarpal bone, unspecified hand, initial encounter for closed fracture: Secondary | ICD-10-CM | POA: Diagnosis not present

## 2012-04-14 DIAGNOSIS — I4891 Unspecified atrial fibrillation: Secondary | ICD-10-CM | POA: Diagnosis present

## 2012-04-14 DIAGNOSIS — R633 Feeding difficulties: Secondary | ICD-10-CM | POA: Diagnosis not present

## 2012-04-14 DIAGNOSIS — R0989 Other specified symptoms and signs involving the circulatory and respiratory systems: Secondary | ICD-10-CM | POA: Diagnosis not present

## 2012-04-14 DIAGNOSIS — K6389 Other specified diseases of intestine: Secondary | ICD-10-CM | POA: Diagnosis not present

## 2012-04-14 DIAGNOSIS — S42302A Unspecified fracture of shaft of humerus, left arm, initial encounter for closed fracture: Secondary | ICD-10-CM

## 2012-04-14 DIAGNOSIS — R55 Syncope and collapse: Secondary | ICD-10-CM | POA: Diagnosis present

## 2012-04-14 DIAGNOSIS — R0902 Hypoxemia: Secondary | ICD-10-CM | POA: Diagnosis not present

## 2012-04-14 DIAGNOSIS — I5032 Chronic diastolic (congestive) heart failure: Secondary | ICD-10-CM | POA: Diagnosis not present

## 2012-04-14 DIAGNOSIS — G4730 Sleep apnea, unspecified: Secondary | ICD-10-CM | POA: Diagnosis not present

## 2012-04-14 DIAGNOSIS — N189 Chronic kidney disease, unspecified: Secondary | ICD-10-CM | POA: Diagnosis present

## 2012-04-14 DIAGNOSIS — E119 Type 2 diabetes mellitus without complications: Secondary | ICD-10-CM | POA: Diagnosis present

## 2012-04-14 DIAGNOSIS — F22 Delusional disorders: Secondary | ICD-10-CM | POA: Diagnosis present

## 2012-04-14 DIAGNOSIS — K560 Paralytic ileus: Secondary | ICD-10-CM | POA: Diagnosis not present

## 2012-04-14 DIAGNOSIS — S92309A Fracture of unspecified metatarsal bone(s), unspecified foot, initial encounter for closed fracture: Secondary | ICD-10-CM | POA: Diagnosis not present

## 2012-04-14 DIAGNOSIS — D649 Anemia, unspecified: Secondary | ICD-10-CM | POA: Diagnosis not present

## 2012-04-14 DIAGNOSIS — N179 Acute kidney failure, unspecified: Secondary | ICD-10-CM | POA: Diagnosis not present

## 2012-04-14 DIAGNOSIS — M84452S Pathological fracture, left femur, sequela: Secondary | ICD-10-CM

## 2012-04-14 DIAGNOSIS — E559 Vitamin D deficiency, unspecified: Secondary | ICD-10-CM | POA: Diagnosis present

## 2012-04-14 DIAGNOSIS — R404 Transient alteration of awareness: Secondary | ICD-10-CM | POA: Diagnosis not present

## 2012-04-14 DIAGNOSIS — IMO0002 Reserved for concepts with insufficient information to code with codable children: Secondary | ICD-10-CM | POA: Diagnosis present

## 2012-04-14 DIAGNOSIS — L02419 Cutaneous abscess of limb, unspecified: Secondary | ICD-10-CM | POA: Diagnosis not present

## 2012-04-14 DIAGNOSIS — R269 Unspecified abnormalities of gait and mobility: Secondary | ICD-10-CM | POA: Diagnosis not present

## 2012-04-14 DIAGNOSIS — C50919 Malignant neoplasm of unspecified site of unspecified female breast: Secondary | ICD-10-CM

## 2012-04-14 DIAGNOSIS — G4733 Obstructive sleep apnea (adult) (pediatric): Secondary | ICD-10-CM | POA: Diagnosis present

## 2012-04-14 DIAGNOSIS — S32402A Unspecified fracture of left acetabulum, initial encounter for closed fracture: Secondary | ICD-10-CM

## 2012-04-14 DIAGNOSIS — M79609 Pain in unspecified limb: Secondary | ICD-10-CM | POA: Diagnosis not present

## 2012-04-14 DIAGNOSIS — S62209A Unspecified fracture of first metacarpal bone, unspecified hand, initial encounter for closed fracture: Secondary | ICD-10-CM

## 2012-04-14 DIAGNOSIS — G250 Essential tremor: Secondary | ICD-10-CM

## 2012-04-14 DIAGNOSIS — R339 Retention of urine, unspecified: Secondary | ICD-10-CM | POA: Diagnosis present

## 2012-04-14 DIAGNOSIS — I495 Sick sinus syndrome: Secondary | ICD-10-CM

## 2012-04-14 DIAGNOSIS — Z950 Presence of cardiac pacemaker: Secondary | ICD-10-CM | POA: Diagnosis not present

## 2012-04-14 DIAGNOSIS — Z8673 Personal history of transient ischemic attack (TIA), and cerebral infarction without residual deficits: Secondary | ICD-10-CM | POA: Diagnosis not present

## 2012-04-14 DIAGNOSIS — I2510 Atherosclerotic heart disease of native coronary artery without angina pectoris: Secondary | ICD-10-CM | POA: Diagnosis present

## 2012-04-14 DIAGNOSIS — M8580 Other specified disorders of bone density and structure, unspecified site: Secondary | ICD-10-CM | POA: Diagnosis present

## 2012-04-14 DIAGNOSIS — S42309A Unspecified fracture of shaft of humerus, unspecified arm, initial encounter for closed fracture: Secondary | ICD-10-CM

## 2012-04-14 DIAGNOSIS — S42202A Unspecified fracture of upper end of left humerus, initial encounter for closed fracture: Secondary | ICD-10-CM

## 2012-04-14 DIAGNOSIS — S6990XA Unspecified injury of unspecified wrist, hand and finger(s), initial encounter: Secondary | ICD-10-CM | POA: Diagnosis not present

## 2012-04-14 DIAGNOSIS — Z9889 Other specified postprocedural states: Secondary | ICD-10-CM

## 2012-04-14 DIAGNOSIS — G47419 Narcolepsy without cataplexy: Secondary | ICD-10-CM | POA: Diagnosis present

## 2012-04-14 HISTORY — DX: Acute posthemorrhagic anemia: D62

## 2012-04-14 HISTORY — DX: Mental disorder, not otherwise specified: F99

## 2012-04-14 LAB — POCT I-STAT, CHEM 8
HCT: 40 % (ref 36.0–46.0)
Hemoglobin: 13.6 g/dL (ref 12.0–15.0)
Potassium: 3.6 mEq/L (ref 3.5–5.1)
Sodium: 143 mEq/L (ref 135–145)

## 2012-04-14 LAB — CBC WITH DIFFERENTIAL/PLATELET
Basophils Absolute: 0 10*3/uL (ref 0.0–0.1)
Basophils Relative: 0 % (ref 0–1)
Eosinophils Relative: 2 % (ref 0–5)
HCT: 39.5 % (ref 36.0–46.0)
MCHC: 33.7 g/dL (ref 30.0–36.0)
Monocytes Absolute: 0.7 10*3/uL (ref 0.1–1.0)
Neutro Abs: 9.2 10*3/uL — ABNORMAL HIGH (ref 1.7–7.7)
Platelets: 187 10*3/uL (ref 150–400)
RDW: 14.6 % (ref 11.5–15.5)
WBC: 12 10*3/uL — ABNORMAL HIGH (ref 4.0–10.5)

## 2012-04-14 LAB — PROTIME-INR: Prothrombin Time: 15.6 seconds — ABNORMAL HIGH (ref 11.6–15.2)

## 2012-04-14 LAB — APTT: aPTT: 32 seconds (ref 24–37)

## 2012-04-14 MED ORDER — OXYCODONE HCL 5 MG PO TABS
5.0000 mg | ORAL_TABLET | ORAL | Status: DC | PRN
  Administered 2012-04-14 – 2012-04-16 (×4): 5 mg via ORAL
  Filled 2012-04-14 (×4): qty 1

## 2012-04-14 MED ORDER — GUAIFENESIN ER 600 MG PO TB12
1200.0000 mg | ORAL_TABLET | Freq: Every day | ORAL | Status: DC
  Filled 2012-04-14: qty 2

## 2012-04-14 MED ORDER — ONDANSETRON HCL 4 MG PO TABS: 4.0000 mg | ORAL_TABLET | Freq: Four times a day (QID) | ORAL | Status: DC | PRN

## 2012-04-14 MED ORDER — METFORMIN HCL 500 MG PO TABS
500.0000 mg | ORAL_TABLET | Freq: Every day | ORAL | Status: DC
  Administered 2012-04-15 – 2012-04-27 (×13): 500 mg via ORAL
  Filled 2012-04-14 (×15): qty 1

## 2012-04-14 MED ORDER — FERROUS SULFATE 325 (65 FE) MG PO TABS
325.0000 mg | ORAL_TABLET | Freq: Every day | ORAL | Status: DC
  Administered 2012-04-15 – 2012-04-27 (×13): 325 mg via ORAL
  Filled 2012-04-14 (×16): qty 1

## 2012-04-14 MED ORDER — NIACINAMIDE 500 MG PO TABS: 500.0000 mg | ORAL_TABLET | Freq: Two times a day (BID) | ORAL | Status: DC

## 2012-04-14 MED ORDER — MORPHINE SULFATE 2 MG/ML IJ SOLN
2.0000 mg | INTRAMUSCULAR | Status: DC | PRN
  Administered 2012-04-14 – 2012-04-15 (×2): 2 mg via INTRAVENOUS
  Filled 2012-04-14 (×2): qty 1

## 2012-04-14 MED ORDER — POTASSIUM CHLORIDE CRYS ER 20 MEQ PO TBCR
40.0000 meq | EXTENDED_RELEASE_TABLET | Freq: Every day | ORAL | Status: DC
  Administered 2012-04-14 – 2012-04-24 (×11): 40 meq via ORAL
  Filled 2012-04-14 (×13): qty 2

## 2012-04-14 MED ORDER — MAGNESIUM 250 MG PO TABS: 250.0000 mg | ORAL_TABLET | Freq: Every day | ORAL | Status: DC

## 2012-04-14 MED ORDER — LEVALBUTEROL TARTRATE 45 MCG/ACT IN AERO
2.0000 | INHALATION_SPRAY | Freq: Four times a day (QID) | RESPIRATORY_TRACT | Status: DC | PRN
  Filled 2012-04-14: qty 15

## 2012-04-14 MED ORDER — SODIUM CHLORIDE 0.9 % IJ SOLN
3.0000 mL | Freq: Two times a day (BID) | INTRAMUSCULAR | Status: DC
  Administered 2012-04-14 – 2012-04-26 (×17): 3 mL via INTRAVENOUS

## 2012-04-14 MED ORDER — SODIUM CHLORIDE 0.9 % IV SOLN
INTRAVENOUS | Status: AC
  Administered 2012-04-14: 18:00:00 via INTRAVENOUS

## 2012-04-14 MED ORDER — CILOSTAZOL 100 MG PO TABS
100.0000 mg | ORAL_TABLET | Freq: Every day | ORAL | Status: DC
  Administered 2012-04-14 – 2012-04-26 (×13): 100 mg via ORAL
  Filled 2012-04-14 (×15): qty 1

## 2012-04-14 MED ORDER — POTASSIUM CHLORIDE CRYS ER 20 MEQ PO TBCR
60.0000 meq | EXTENDED_RELEASE_TABLET | Freq: Every day | ORAL | Status: DC
  Filled 2012-04-14: qty 3

## 2012-04-14 MED ORDER — POTASSIUM CHLORIDE CRYS ER 20 MEQ PO TBCR: 40.0000 meq | EXTENDED_RELEASE_TABLET | Freq: Two times a day (BID) | ORAL | Status: DC

## 2012-04-14 MED ORDER — MORPHINE SULFATE 4 MG/ML IJ SOLN
2.0000 mg | INTRAMUSCULAR | Status: DC | PRN
  Administered 2012-04-14: 2 mg via INTRAVENOUS
  Filled 2012-04-14: qty 1

## 2012-04-14 MED ORDER — ALUM & MAG HYDROXIDE-SIMETH 200-200-20 MG/5ML PO SUSP: 30.0000 mL | Freq: Four times a day (QID) | ORAL | Status: DC | PRN

## 2012-04-14 MED ORDER — FLUTICASONE PROPIONATE 50 MCG/ACT NA SUSP
2.0000 | Freq: Every day | NASAL | Status: DC
  Administered 2012-04-14 – 2012-04-26 (×13): 2 via NASAL
  Filled 2012-04-14 (×2): qty 16

## 2012-04-14 MED ORDER — ONDANSETRON HCL 4 MG/2ML IJ SOLN: 4.0000 mg | Freq: Three times a day (TID) | INTRAMUSCULAR | Status: DC | PRN

## 2012-04-14 MED ORDER — VITAMIN C 250 MG PO TABS
250.0000 mg | ORAL_TABLET | ORAL | Status: DC
  Administered 2012-04-15 – 2012-04-27 (×6): 250 mg via ORAL
  Filled 2012-04-14 (×6): qty 1

## 2012-04-14 MED ORDER — FERROUS SULFATE 250 MG PO CPCR: 1.0000 | ORAL_CAPSULE | Freq: Every day | ORAL | Status: DC

## 2012-04-14 MED ORDER — NIACIN 500 MG PO TABS
500.0000 mg | ORAL_TABLET | Freq: Two times a day (BID) | ORAL | Status: DC
  Administered 2012-04-15 – 2012-04-26 (×20): 500 mg via ORAL
  Filled 2012-04-14 (×30): qty 1

## 2012-04-14 MED ORDER — FUROSEMIDE 80 MG PO TABS
80.0000 mg | ORAL_TABLET | Freq: Two times a day (BID) | ORAL | Status: DC
  Administered 2012-04-15 – 2012-04-19 (×9): 80 mg via ORAL
  Filled 2012-04-14 (×13): qty 1

## 2012-04-14 MED ORDER — ACETAMINOPHEN 325 MG PO TABS: 650.0000 mg | ORAL_TABLET | Freq: Four times a day (QID) | ORAL | Status: DC | PRN

## 2012-04-14 MED ORDER — ONDANSETRON HCL 4 MG/2ML IJ SOLN: 4.0000 mg | Freq: Four times a day (QID) | INTRAMUSCULAR | Status: DC | PRN

## 2012-04-14 MED ORDER — ACETAMINOPHEN 650 MG RE SUPP: 650.0000 mg | Freq: Four times a day (QID) | RECTAL | Status: DC | PRN

## 2012-04-14 MED ORDER — VITAMIN E 45 MG (100 UNIT) PO CAPS: 100.0000 [IU] | ORAL_CAPSULE | ORAL | Status: DC

## 2012-04-14 MED ORDER — MOMETASONE FURO-FORMOTEROL FUM 100-5 MCG/ACT IN AERO
2.0000 | INHALATION_SPRAY | Freq: Two times a day (BID) | RESPIRATORY_TRACT | Status: DC
  Administered 2012-04-14 – 2012-04-27 (×22): 2 via RESPIRATORY_TRACT
  Filled 2012-04-14 (×3): qty 8.8

## 2012-04-14 MED ORDER — LETROZOLE 2.5 MG PO TABS
2.5000 mg | ORAL_TABLET | Freq: Every day | ORAL | Status: DC
Start: 2012-04-15 — End: 2012-04-27
  Administered 2012-04-15 – 2012-04-27 (×13): 2.5 mg via ORAL
  Filled 2012-04-14 (×13): qty 1

## 2012-04-14 MED ORDER — ATORVASTATIN CALCIUM 20 MG PO TABS
20.0000 mg | ORAL_TABLET | Freq: Every day | ORAL | Status: DC
  Administered 2012-04-15 – 2012-04-26 (×11): 20 mg via ORAL
  Filled 2012-04-14 (×13): qty 1

## 2012-04-14 MED ORDER — VITAMIN E 45 MG (100 UNIT) PO CAPS
100.0000 [IU] | ORAL_CAPSULE | ORAL | Status: DC
  Administered 2012-04-14 – 2012-04-25 (×4): 100 [IU] via ORAL
  Filled 2012-04-14 (×4): qty 1

## 2012-04-14 MED ORDER — POLYETHYLENE GLYCOL 3350 17 G PO PACK
17.0000 g | PACK | Freq: Every day | ORAL | Status: DC | PRN
  Filled 2012-04-14 (×2): qty 1

## 2012-04-14 MED ORDER — TIOTROPIUM BROMIDE MONOHYDRATE 18 MCG IN CAPS
18.0000 ug | ORAL_CAPSULE | Freq: Every day | RESPIRATORY_TRACT | Status: DC
Start: 2012-04-15 — End: 2012-04-27
  Administered 2012-04-15 – 2012-04-27 (×12): 18 ug via RESPIRATORY_TRACT
  Filled 2012-04-14 (×3): qty 5

## 2012-04-14 MED ORDER — GUAIFENESIN ER 600 MG PO TB12
600.0000 mg | ORAL_TABLET | Freq: Every day | ORAL | Status: DC
  Administered 2012-04-14: 600 mg via ORAL
  Filled 2012-04-14 (×2): qty 1

## 2012-04-14 MED ORDER — MAGNESIUM OXIDE 400 (241.3 MG) MG PO TABS
200.0000 mg | ORAL_TABLET | Freq: Every day | ORAL | Status: DC
  Administered 2012-04-15 – 2012-04-18 (×4): 200 mg via ORAL
  Administered 2012-04-20: 11:00:00 via ORAL
  Administered 2012-04-21 – 2012-04-27 (×6): 200 mg via ORAL
  Filled 2012-04-14 (×13): qty 0.5

## 2012-04-14 MED ORDER — ATENOLOL 25 MG PO TABS
25.0000 mg | ORAL_TABLET | Freq: Every day | ORAL | Status: DC
  Administered 2012-04-15 – 2012-04-24 (×10): 25 mg via ORAL
  Filled 2012-04-14 (×11): qty 1

## 2012-04-14 NOTE — ED Notes (Signed)
[employer] pacemaker interrogated & awaiting report, family updated

## 2012-04-14 NOTE — Progress Notes (Signed)
Orthopedic Tech Progress Note Patient Details:  [name] 01/30/1932 [id number] Thumb spica splint applied to Left thumb, arm sling applied to left shoulder. Tolerated well.  Ortho Devices Type of Ortho Device: Arm sling;Thumb spica splint Splint Material: Plaster Ortho Device/Splint Location: Left  Ortho Device/Splint Interventions: Application   Asia R Thompson 04/14/2012, 3:42 PM

## 2012-04-14 NOTE — ED Notes (Signed)
Spoke with [employer] representative & they did not receive interrogation, interrogation repeated & will recheck with [employer] representative, family updated on plan of care, awaiting ortho tech for placement of arm sling & thumb spica
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
## 2012-04-14 NOTE — ED Notes (Signed)
Spoke with Radiology re: imaging, transporter to transport pt for procedure, NT performing in & out cath prior to transport

## 2012-04-14 NOTE — ED Notes (Signed)
[employer] interrogation completed again & [facility] [facility] office contacted x 2 to verify the information was transmitted, was placed on hold x 17 mins, when I entered the pts room [name] was at bedside completing interrogation, was informed that since the device was placed in 2005 the interrogation would not be transmitted & the RN would not be able to know that without speaking with him, interrogation currently being completed

## 2012-04-15 ENCOUNTER — Observation Stay (HOSPITAL_COMMUNITY): Payer: Medicare Other

## 2012-04-15 DIAGNOSIS — M79609 Pain in unspecified limb: Secondary | ICD-10-CM | POA: Diagnosis not present

## 2012-04-15 DIAGNOSIS — E119 Type 2 diabetes mellitus without complications: Secondary | ICD-10-CM

## 2012-04-15 DIAGNOSIS — S8000XA Contusion of unspecified knee, initial encounter: Secondary | ICD-10-CM | POA: Diagnosis not present

## 2012-04-15 DIAGNOSIS — G4733 Obstructive sleep apnea (adult) (pediatric): Secondary | ICD-10-CM

## 2012-04-15 DIAGNOSIS — S62233A Other displaced fracture of base of first metacarpal bone, unspecified hand, initial encounter for closed fracture: Secondary | ICD-10-CM

## 2012-04-15 DIAGNOSIS — I4891 Unspecified atrial fibrillation: Secondary | ICD-10-CM

## 2012-04-15 DIAGNOSIS — S6990XA Unspecified injury of unspecified wrist, hand and finger(s), initial encounter: Secondary | ICD-10-CM | POA: Diagnosis not present

## 2012-04-15 DIAGNOSIS — J449 Chronic obstructive pulmonary disease, unspecified: Secondary | ICD-10-CM

## 2012-04-15 DIAGNOSIS — S42309A Unspecified fracture of shaft of humerus, unspecified arm, initial encounter for closed fracture: Secondary | ICD-10-CM

## 2012-04-15 LAB — CBC
Hemoglobin: 11.1 g/dL — ABNORMAL LOW (ref 12.0–15.0)
MCH: 30.4 pg (ref 26.0–34.0)
RBC: 3.65 MIL/uL — ABNORMAL LOW (ref 3.87–5.11)
WBC: 11.5 10*3/uL — ABNORMAL HIGH (ref 4.0–10.5)

## 2012-04-15 LAB — BASIC METABOLIC PANEL
CO2: 24 mEq/L (ref 19–32)
Chloride: 103 mEq/L (ref 96–112)
Glucose, Bld: 156 mg/dL — ABNORMAL HIGH (ref 70–99)
Potassium: 3.8 mEq/L (ref 3.5–5.1)
Sodium: 138 mEq/L (ref 135–145)

## 2012-04-15 MED ORDER — ALPRAZOLAM 0.5 MG PO TABS
0.5000 mg | ORAL_TABLET | Freq: Four times a day (QID) | ORAL | Status: DC | PRN
  Administered 2012-04-15 – 2012-04-24 (×4): 0.5 mg via ORAL
  Filled 2012-04-15 (×6): qty 1

## 2012-04-15 MED ORDER — ENOXAPARIN SODIUM 80 MG/0.8ML ~~LOC~~ SOLN
80.0000 mg | Freq: Two times a day (BID) | SUBCUTANEOUS | Status: DC
  Administered 2012-04-15 – 2012-04-18 (×8): 80 mg via SUBCUTANEOUS
  Filled 2012-04-15 (×10): qty 0.8

## 2012-04-15 MED ORDER — GUAIFENESIN ER 600 MG PO TB12
1200.0000 mg | ORAL_TABLET | Freq: Two times a day (BID) | ORAL | Status: DC
  Administered 2012-04-15 – 2012-04-27 (×25): 1200 mg via ORAL
  Filled 2012-04-15 (×28): qty 2
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
## 2012-04-15 NOTE — Progress Notes (Signed)
PT Cancellation Note  Patient Details Name: [name] MRN: [id number] DOB: 1931/03/30   Cancelled Treatment:    Reason Eval/Treat Not Completed: Patient at procedure or test/unavailable.  Pt OOF.  Will f/u another time.     Sunny Schlein, [place] [phone] 04/15/2012, 11:54 AM
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
## 2012-04-15 NOTE — Care Management Utilization Note (Addendum)
CARE MANAGE MENT UTILIZATION REVIEW NOTE 04/15/2012     Patient:  [name], [name]   Account Number:  [id number]  Documented by:  Roxy Manns Diamante Rubin   Per Ur Regulation Reviewed for med. necessity/level of care/duration of stay

## 2012-04-15 NOTE — Progress Notes (Signed)
RT Note: Pt placed on CPAP 4CM H2o per pt comfort w/ 2l O2 bleed in. T will continue to monitor

## 2012-04-16 LAB — BASIC METABOLIC PANEL
BUN: 14 mg/dL (ref 6–23)
Creatinine, Ser: 0.84 mg/dL (ref 0.50–1.10)
GFR calc Af Amer: 75 mL/min — ABNORMAL LOW (ref >90)
GFR calc non Af Amer: 65 mL/min — ABNORMAL LOW (ref >90)
Glucose, Bld: 172 mg/dL — ABNORMAL HIGH (ref 70–99)
Potassium: 3.1 mEq/L — ABNORMAL LOW (ref 3.5–5.1)

## 2012-04-16 LAB — CBC
HCT: 32.8 % — ABNORMAL LOW (ref 36.0–46.0)
Hemoglobin: 11 g/dL — ABNORMAL LOW (ref 12.0–15.0)
MCH: 29.6 pg (ref 26.0–34.0)
MCHC: 33.5 g/dL (ref 30.0–36.0)
MCV: 88.4 fL (ref 78.0–100.0)
RDW: 14.6 % (ref 11.5–15.5)

## 2012-04-16 LAB — GLUCOSE, CAPILLARY: Glucose-Capillary: 157 mg/dL — ABNORMAL HIGH (ref 70–99)

## 2012-04-16 MED ORDER — TRAMADOL HCL 50 MG PO TABS
50.0000 mg | ORAL_TABLET | Freq: Three times a day (TID) | ORAL | Status: DC
  Administered 2012-04-16 – 2012-04-27 (×32): 50 mg via ORAL
  Filled 2012-04-16 (×35): qty 1

## 2012-04-16 MED ORDER — CELECOXIB 200 MG PO CAPS
200.0000 mg | ORAL_CAPSULE | Freq: Every day | ORAL | Status: DC
  Administered 2012-04-16 – 2012-04-19 (×4): 200 mg via ORAL
  Filled 2012-04-16 (×4): qty 1

## 2012-04-16 MED ORDER — PANTOPRAZOLE SODIUM 40 MG PO TBEC
40.0000 mg | DELAYED_RELEASE_TABLET | Freq: Every day | ORAL | Status: DC
  Administered 2012-04-16 – 2012-04-27 (×11): 40 mg via ORAL
  Filled 2012-04-16 (×12): qty 1

## 2012-04-16 MED ORDER — BISACODYL 10 MG RE SUPP
10.0000 mg | Freq: Once | RECTAL | Status: AC
  Administered 2012-04-16: 10 mg via RECTAL
  Filled 2012-04-16: qty 1

## 2012-04-16 MED ORDER — MAGNESIUM HYDROXIDE 400 MG/5ML PO SUSP
30.0000 mL | Freq: Every day | ORAL | Status: DC
  Administered 2012-04-16 – 2012-04-18 (×3): 30 mL via ORAL
  Filled 2012-04-16 (×10): qty 30

## 2012-04-16 MED ORDER — OXYCODONE HCL 5 MG PO TABS
5.0000 mg | ORAL_TABLET | Freq: Four times a day (QID) | ORAL | Status: DC | PRN
  Administered 2012-04-17: 5 mg via ORAL
  Filled 2012-04-16: qty 1

## 2012-04-16 NOTE — Progress Notes (Signed)
Patient states, "I do not take Niacin", medication refused; will continue to monitor patient. Lorretta Harp RN

## 2012-04-16 NOTE — Progress Notes (Signed)
Patient's telemetry has been discontinued per MD; will continue to monitor patient. Lorretta Harp RN

## 2012-04-16 NOTE — Care Management (Signed)
UR Completed Marguerita Stapp Graves-Bigelow, RN,BSN [phone]  
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
## 2012-04-16 NOTE — Progress Notes (Signed)
HAND PT SEEN/EXAMINED PT'S FILMS REVIEWED PT WITH MINIMALLY DISPLACED BASE OF THUMB METACARPAL FRACTURE WITH UNDERLYING CMC ARTHRITIS IN NONDOMINANT THUMB. WILL CONTINUE TO TREAT NON-OP WILL ASK OT TO FABRICATE THUMB SPICA SPLINT WILL ASK NURSING TO APPLY ICE PACKS TO SHOULDER AND THUMB HAVE INSTRUCTED TO FAMILY TO ENCOURAGE THUMB/HAND ELEVATED AT HEART LEVEL DIFFICULT TO WEAR SLING IN BED WEAR SLING WHEN UPRIGHT AND OUT OF BED. WILL NEED TO F/U WITH ME IN OFFICE IN 10 DAYS, WILL REXRAY THEN SISTER-IN-LAW SHOULD HAVE MY CELL PHONE NUMBER BUT IF ANY QUESTIONS REGARDING CARE OF THUMB/HAND ARISE  PLEASE CALL MY CELL, DO NOT CALL OFFICE [phone]

## 2012-04-16 NOTE — Progress Notes (Signed)
Subjective: Awakens easily but sounds a little sedated. Is still having a fair amount of pain. Appreciate Ortho's help.  Objective: Lab: Lab Results  Component Value Date   WBC 10.6* 04/16/2012   HGB 11.0* 04/16/2012   HCT 32.8* 04/16/2012   MCV 88.4 04/16/2012   PLT 152 04/16/2012   BMET    Component Value Date/Time   NA 134* 04/16/2012 0505   K 3.1* 04/16/2012 0505   CL 98 04/16/2012 0505   CO2 26 04/16/2012 0505   GLUCOSE 172* 04/16/2012 0505   BUN 14 04/16/2012 0505   BUN 18 07/14/2010 0922   CREATININE 0.84 04/16/2012 0505   CREATININE 1.0 07/14/2010 0922   CALCIUM 8.3* 04/16/2012 0505   GFRNONAA 65* 04/16/2012 0505   GFRAA 75* 04/16/2012 0505     Imaging:  Scheduled Meds: . atenolol  25 mg Oral Daily  . atorvastatin  20 mg Oral q1800  . cilostazol  100 mg Oral QHS  . enoxaparin (LOVENOX) injection  80 mg Subcutaneous Q12H  . ferrous sulfate  325 mg Oral Q breakfast  . fluticasone  2 spray Each Nare QHS  . furosemide  80 mg Oral BID  . guaiFENesin  1,200 mg Oral BID  . letrozole  2.5 mg Oral Daily  . magnesium oxide  200 mg Oral Daily  . metFORMIN  500 mg Oral Q breakfast  . mometasone-formoterol  2 puff Inhalation BID  . niacin  500 mg Oral BID WC  . potassium chloride SA  40 mEq Oral QHS  . sodium chloride  3 mL Intravenous Q12H  . tiotropium  18 mcg Inhalation Daily  . vitamin C  250 mg Oral Q M,W,F  . vitamin E  100 Units Oral 2 times weekly   Continuous Infusions:  PRN Meds:.acetaminophen, acetaminophen, ALPRAZolam, alum & mag hydroxide-simeth, levalbuterol, ondansetron (ZOFRAN) IV, ondansetron, oxyCODONE, polyethylene glycol   Physical Exam: Filed Vitals:   04/16/12 0919  BP: 101/57  Pulse: 73  Temp: 98.5 F (36.9 C)  Resp:   Gen'l - older woman in no distress but sluggist HEENT- [place]/ AT Cor- 2+ radial, RRR PUlm - no increased WOB, no wheezing Abd- soft Extremities - right hand bruised, tender at 1st MCP but has better grip today. Left hand in thumb splint and ACE. Hand  is swollen and ecchymotic. Movement of proximal UE causes great pain. LE w/o much change.      Assessment/Plan: 1. Ortho - no need for surgical intervention.  Plan Reduce narcotics  Will change left hand/arm splint daily  2. PUlm - stable  3. Hypersomnolence - is a little groggy - due to meds.  4. A. Fib - stable rate.   Plan Resume eliquis  D/c/telemetry - stable a. Fib  5. DM - stable  6. Dispo - PT/OT recommend SNF. May be a candidate for CIR  Plan - will request CIR eval     Illene Regulus [place] [facility] (o) [phone]; (c) [phone] Call-grp - [facility]  Tele: [phone]  04/16/2012, 11:31 AM

## 2012-04-17 ENCOUNTER — Inpatient Hospital Stay (HOSPITAL_COMMUNITY): Payer: Medicare Other

## 2012-04-17 LAB — GLUCOSE, CAPILLARY
Glucose-Capillary: 145 mg/dL — ABNORMAL HIGH (ref 70–99)
Glucose-Capillary: 173 mg/dL — ABNORMAL HIGH (ref 70–99)
Glucose-Capillary: 147 mg/dL — ABNORMAL HIGH (ref 70–99)

## 2012-04-17 LAB — CBC
HCT: 31.2 % — ABNORMAL LOW (ref 36.0–46.0)
MCHC: 34 g/dL (ref 30.0–36.0)
Platelets: 156 10*3/uL (ref 150–400)
RDW: 14.5 % (ref 11.5–15.5)
WBC: 10.5 10*3/uL (ref 4.0–10.5)
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
## 2012-04-18 DIAGNOSIS — S8290XS Unspecified fracture of unspecified lower leg, sequela: Secondary | ICD-10-CM

## 2012-04-18 LAB — GLUCOSE, CAPILLARY
Glucose-Capillary: 142 mg/dL — ABNORMAL HIGH (ref 70–99)
Glucose-Capillary: 132 mg/dL — ABNORMAL HIGH (ref 70–99)
Glucose-Capillary: 130 mg/dL — ABNORMAL HIGH (ref 70–99)
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
## 2012-04-18 NOTE — Progress Notes (Signed)
04/18/12 1500  OT Visit Information  Last OT Received On 04/18/12  Assistance Needed +2  OT Time Calculation  OT Start Time 1503  OT Stop Time 1541  OT Time Calculation (min) 38 min  Precautions  Precautions Fall  Precaution Comments Sling L UE, no orders written, but maintained L UE as NWBing.    Required Braces or Orthoses Other Brace/Splint  Other Brace/Splint LUE sling and left thumb spica splint  Restrictions  Weight Bearing Restrictions Yes  LUE Weight Bearing NWB  LLE Weight Bearing NWB  Other Position/Activity Restrictions No order written, but L UE maintained NWBing.    ADL  ADL Comments Splint removed.  No signs of redness.  Pt. unable to fully flex IP of thumb, so that was adjusted to allow flexion.  Pt and dtr instructed in splint wear and care.  Dtr and pt with multiple questions not regarding splint.  Attempted to answer those, but also directed them to the MD.  RN and CNA were instructed in splint wear and care and instructions were posted over bed.  Cognition  Overall Cognitive Status Appears within functional limits for tasks assessed/performed  Arousal/Alertness Awake/alert  Orientation Level Appears intact for tasks assessed  Behavior During Session Anxious  OT - End of Session  Activity Tolerance Patient tolerated treatment well  Patient left in bed;with call bell/phone within reach;with nursing in room;with family/visitor present  Nurse Communication Other (comment) (splint wear and care)  OT Assessment/Plan  Comments on Treatment Session Splint fitting well.  Minor adjustment made to allow for full IP flexion of thumb.  Nursing staff instructed in wear schedule and monitoring splint.  Family present and instructed in splint wear and care.  Family with mulitple questions - directed them to MD as many were related to management plan, and medical issues.  OT Plan Discharge plan remains appropriate  OT Frequency Min 3X/week  Follow Up Recommendations SNF  OT  Equipment None recommended by OT  ADL Goals  ADL Goal: Additional Goal #1 - Progress Progressing toward goals  ADL Goal: Additional Goal #2 - Progress Progressing toward goals  OT General Charges  $OT Visit 1 Procedure  OT Treatments  $Self Care/Home Management  23-37 mins   [employer], OTR/L [phone]
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
## 2012-04-18 NOTE — Progress Notes (Signed)
Orthopedic Tech Progress Note Patient Details:  [name] Sep 01, 1931 [id number] Occupational therapy called requesting thumb spica to see if velcro splint would work for patient and fit needs. Occupational therapy then decided that they would have to make their own splint for patient. No action taken by Ortho Tech.  Patient ID: [name], female   DOB: 1931/04/18, 79 y.o.   MRN: [id number]   [name] 04/18/2012, 10:50 AM

## 2012-04-18 NOTE — Progress Notes (Signed)
Occupational Therapy Treatment Patient Details Name: [name] MRN: [id number] DOB: 04-14-1931 Today's Date: 04/18/2012 Time: 5784-6962 OT Time Calculation (min): 39 min  OT Assessment / Plan / Recommendation Comments on Treatment Session This patient is making progress with bed mobility; however does not seem to understand how having her try to move herself helps her more than [name] moving her--intermittent agitation with [name] due to this.     Follow Up Recommendations  SNF       Equipment Recommendations  None recommended by OT       Frequency Min 3X/week (due to splint on LUE)   Plan Discharge plan remains appropriate    Precautions / Restrictions Precautions Precautions: Fall Precaution Comments: Sling L UE, no orders written, but maintained L UE as NWBing.   Required Braces or Orthoses: Other Brace/Splint Other Brace/Splint: LUE sling when up and thumb spica splint Restrictions Weight Bearing Restrictions: Yes LUE Weight Bearing: Non weight bearing LLE Weight Bearing: Non weight bearing Other Position/Activity Restrictions: No order written, but L UE maintained NWBing.     Pertinent Vitals/Pain Grimaces when she rolls to the right    ADL  ADL Comments: Splint removed.  No signs of redness.  Pt. unable to fully flex IP of thumb, so that was adjusted to allow flexion.  Pt and dtr instructed in splint wear and care.  Dtr and pt with multiple questions not regarding splint.  Attempted to answer those, but also directed them to the MD.  RN and CNA were instructed in splint wear and care and instructions were posted over bed.      OT Goals Acute Rehab OT Goals OT Goal Formulation: With patient Time For Goal Achievement: 04/29/12 Potential to Achieve Goals: Good ADL Goals Additional ADL Goal #1: Pt will tolerate splint wear per schedule - Lt. thumb spica splint ADL Goal: Additional Goal #1 - Progress: Progressing toward goals Additional ADL Goal #2: Pt/family will be  independent with splint wear and care ADL Goal: Additional Goal #2 - Progress: Progressing toward goals Miscellaneous OT Goals OT Goal: Miscellaneous Goal #1 - Progress: Met OT Goal: Miscellaneous Goal #2 - Progress: Met OT Goal: Miscellaneous Goal #3 - Progress: Met Miscellaneous OT Goal #4: Pt will roll right with S to A with BADLs and in prep for transfers Miscellaneous OT Goal #5: Pt will come up to sit with Supervision in prep for lateral transfers OT Goal: Miscellaneous Goal #5 - Progress: Goal set today  Visit Information  Last OT Received On: 04/18/12 Assistance Needed: +2 PT/OT Co-Evaluation/Treatment: Yes    Subjective Data  Subjective: "It's just too much wasted energy on my part to roll over and then sit up, it's easier to just raise the head of the bed and swing me around"      Cognition  Cognition Overall Cognitive Status: Appears within functional limits for tasks assessed/performed Arousal/Alertness: Awake/alert Orientation Level: Appears intact for tasks assessed Behavior During Session: Agitated (intermittently)    Mobility  Bed Mobility Bed Mobility: Rolling Right;Right Sidelying to Sit;Sit to Sidelying Right;Scooting to [facility] Rolling Right: 4: Min assist;With rail (used her RUE) Right Sidelying to Sit: 4: Min assist Sit to Sidelying Right: 1: +2 Total assist Sit to Sidelying Right: Patient Percentage: 50% Scooting to HOB: 1: +2 Total assist Scooting to [facility]: Patient Percentage: 0% Transfers Details for Transfer Assistance: max encouragement but needing min facilitation to sequence sitting EOB; needs increased assistance to elevate leg to bed (wants to keep her LLE straight)  during sit->supine and guidance of trunk to bed; sitting EOB pt scooted x2 in the right direction (maybe 2 inches) but quit reporting she was too tired and did not want to try anymore    Exercises  General Exercises - Lower Extremity Long Arc Quad: AROM;Left;5 reps;Seated   Balance  Balance Balance Assessed: Yes Static Sitting Balance Static Sitting - Balance Support: Right upper extremity supported;Feet supported Static Sitting - Level of Assistance: 7: Independent Static Sitting - Comment/# of Minutes: 10 minutes   End of Session OT - End of Session Activity Tolerance: Patient limited by fatigue Patient left: in bed;with call bell/phone within reach;with nursing in room;with family/visitor present Nurse Communication: Other (comment) (splint wear and care)       Evette Georges [phone] 04/18/2012, 4:45 PM
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
## 2012-04-18 NOTE — Progress Notes (Signed)
PT/OT Cancellation Note  Patient Details Name: [name] MRN: [id number] DOB: [birth year]   Cancelled Treatment:    Reason Eval/Treat Not Completed: Noted CT results "Mildly displaced intra-articular fracture of the roof and posterior wall of the left acetabulum". Need clarification of WB status from ortho given acetabular fracture prior to OOB activity.     [facility] [name] 04/18/2012, 11:24 AM Pager: [phone]
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
## 2012-04-18 NOTE — Progress Notes (Signed)
Subjective: Awake and alert. Would like a better bath and shampoo. NO change in condition  Objective: Lab: Lab Results  Component Value Date   WBC 10.5 04/17/2012   HGB 10.6* 04/17/2012   HCT 31.2* 04/17/2012   MCV 88.4 04/17/2012   PLT 156 04/17/2012   BMET    Component Value Date/Time   NA 134* 04/16/2012 0505   K 3.1* 04/16/2012 0505   CL 98 04/16/2012 0505   CO2 26 04/16/2012 0505   GLUCOSE 172* 04/16/2012 0505   BUN 14 04/16/2012 0505   BUN 18 07/14/2010 0922   CREATININE 0.84 04/16/2012 0505   CREATININE 1.0 07/14/2010 0922   CALCIUM 8.3* 04/16/2012 0505   GFRNONAA 65* 04/16/2012 0505   GFRAA 75* 04/16/2012 0505     Imaging: CT left ZOX:[id number]:  1. Mildly displaced intra-articular fracture of the roof and  posterior wall of the left acetabulum as described.  2. Intact sacrum and sacroiliac joints.  3. No evidence of proximal femur fracture or dislocation.  4. Small associated hematoma involving the obturator internus  muscle.   Scheduled Meds: . atenolol  25 mg Oral Daily  . atorvastatin  20 mg Oral q1800  . celecoxib  200 mg Oral Daily  . cilostazol  100 mg Oral QHS  . enoxaparin (LOVENOX) injection  80 mg Subcutaneous Q12H  . ferrous sulfate  325 mg Oral Q breakfast  . fluticasone  2 spray Each Nare QHS  . furosemide  80 mg Oral BID  . guaiFENesin  1,200 mg Oral BID  . letrozole  2.5 mg Oral Daily  . magnesium hydroxide  30 mL Oral QHS  . magnesium oxide  200 mg Oral Daily  . metFORMIN  500 mg Oral Q breakfast  . mometasone-formoterol  2 puff Inhalation BID  . niacin  500 mg Oral BID WC  . pantoprazole  40 mg Oral Daily  . potassium chloride SA  40 mEq Oral QHS  . sodium chloride  3 mL Intravenous Q12H  . tiotropium  18 mcg Inhalation Daily  . traMADol  50 mg Oral Q8H  . vitamin C  250 mg Oral Q M,W,F  . vitamin E  100 Units Oral 2 times weekly   Continuous Infusions:  PRN Meds:.acetaminophen, acetaminophen, ALPRAZolam, alum & mag hydroxide-simeth, levalbuterol, ondansetron  (ZOFRAN) IV, ondansetron, oxyCODONE, polyethylene glycol   Physical Exam: Filed Vitals:   04/18/12 0607  BP: 100/42  Pulse: 81  Temp: 98 F (36.7 C)  Resp: 20   Gen'l - older white woman in no acute distress Cor - 2+ radial pulse right PUlm - nomral respirations, no wheezing Abd - protuberant, BS+, no guarding or rebound Ext - left wrist with less swelling. Derm - no changes in bruises Neuro - A&O x 3     Assessment/Plan: 1. Ortho - confirmed fracture of acetabulum, not displace. Plan  Per ortho  2-5 stable.  6. Dispo - CIR vs ST-SNF   Illene Regulus Condon IM (o) [phone]; (c) [phone] Call-grp - Patsi Sears IM  Tele: [phone]  04/18/2012, 7:02 AM

## 2012-04-18 NOTE — Progress Notes (Signed)
Occupational Therapy Treatment Patient Details Name: JEORGIA HELMING MRN: [id number] DOB: 1931-11-27 Today's Date: 04/18/2012 Time: 5621-3086 OT Time Calculation (min): 53 min  OT Assessment / Plan / Recommendation Comments on Treatment Session Lt. thumb spica splint fabricated.  Pt. tolerated procedure well.  Will re-check Lt. UE in 1-2 hours to ensure proper fit.    Follow Up Recommendations  SNF    Barriers to Discharge       Equipment Recommendations  None recommended by OT    Recommendations for Other Services    Frequency Min 3X/week   Plan Discharge plan remains appropriate    Precautions / Restrictions Precautions Precautions: Fall Precaution Comments: Sling L UE, no orders written, but maintained L UE as NWBing.   Required Braces or Orthoses: Other Brace/Splint Other Brace/Splint: LUE sling and left thumb spica splint Restrictions Weight Bearing Restrictions: No Other Position/Activity Restrictions: No order written, but L UE maintained NWBing.     Pertinent Vitals/Pain     ADL  ADL Comments: Pt seen from 0951 - 1033 to asses Lt. UE for splinting.  Family in room.  Explained role as well as plan and purpose of splint.  Pt. moves Lt UE minimally from elbow distally.  Blisters noted on both volar and dorsal surface of forearm, radial aspect.  Both ~1" in length.  Significant bruising dorsum of Lt. hand, as well as moderate edema.  Unable to splint without splinting over volar blister. Reviewed x-rays, and attempted to contact Dr. Melvyn Novas via number in chart to see if hand based splint would be an option; however, no answer.  Returned at 1253 to fabricate splint.  A forearm based thumb spica splint was fabricated with stockinette placed over forearm to buffer blistered areas.  Pt. tolerated procedure well, and splint appears to be fitting well.  Will  return in ~1-2 hours to check splint and modify as needed    OT Diagnosis:    OT Problem List:   OT Treatment  Interventions:     OT Goals Acute Rehab OT Goals OT Goal Formulation: With patient Time For Goal Achievement: 04/29/12 Potential to Achieve Goals: Good ADL Goals Additional ADL Goal #1: Pt will tolerate splint wear per schedule - Lt. thumb spica splint ADL Goal: Additional Goal #1 - Progress: Goal set today Additional ADL Goal #2: Pt/family will be independent with splint wear and care ADL Goal: Additional Goal #2 - Progress: Goal set today  Visit Information  Last OT Received On: 04/18/12 Assistance Needed: +2    Subjective Data      Prior Functioning       Cognition  Cognition Overall Cognitive Status: Appears within functional limits for tasks assessed/performed Arousal/Alertness: Awake/alert Orientation Level: Appears intact for tasks assessed Behavior During Session: [facility] for tasks performed    Mobility       Exercises      Balance     End of Session OT - End of Session Activity Tolerance: Patient tolerated treatment well Patient left: in bed;with call bell/phone within reach;with nursing in room Nurse Communication:  (splint fabricated)  GO     Chairty Toman M 04/18/2012, 2:13 PM

## 2012-04-19 ENCOUNTER — Inpatient Hospital Stay (HOSPITAL_COMMUNITY): Payer: Medicare Other | Admitting: Anesthesiology

## 2012-04-19 ENCOUNTER — Inpatient Hospital Stay (HOSPITAL_COMMUNITY): Payer: Medicare Other

## 2012-04-19 ENCOUNTER — Encounter (HOSPITAL_COMMUNITY)
Admission: EM | Disposition: A | Discharge status: Skilled Nursing Facility | Payer: Self-pay | Source: Home / Self Care | Attending: Internal Medicine

## 2012-04-19 ENCOUNTER — Encounter (HOSPITAL_COMMUNITY): Payer: Self-pay | Admitting: Anesthesiology

## 2012-04-19 DIAGNOSIS — I5032 Chronic diastolic (congestive) heart failure: Secondary | ICD-10-CM

## 2012-04-19 DIAGNOSIS — I509 Heart failure, unspecified: Secondary | ICD-10-CM

## 2012-04-19 DIAGNOSIS — F29 Unspecified psychosis not due to a substance or known physiological condition: Secondary | ICD-10-CM

## 2012-04-19 HISTORY — PX: ORIF ACETABULAR FRACTURE: SHX5029

## 2012-04-19 LAB — CBC
HCT: 30 % — ABNORMAL LOW (ref 36.0–46.0)
Platelets: 192 10*3/uL (ref 150–400)
RDW: 14.6 % (ref 11.5–15.5)
WBC: 9.8 10*3/uL (ref 4.0–10.5)

## 2012-04-19 LAB — CBC WITH DIFFERENTIAL/PLATELET
Eosinophils Absolute: 0.2 10*3/uL (ref 0.0–0.7)
Eosinophils Relative: 2 % (ref 0–5)
HCT: 30 % — ABNORMAL LOW (ref 36.0–46.0)
Lymphocytes Relative: 15 % (ref 12–46)
Lymphs Abs: 1.6 10*3/uL (ref 0.7–4.0)
MCH: 30 pg (ref 26.0–34.0)
MCV: 89 fL (ref 78.0–100.0)
Monocytes Absolute: 1.1 10*3/uL — ABNORMAL HIGH (ref 0.1–1.0)
RBC: 3.37 MIL/uL — ABNORMAL LOW (ref 3.87–5.11)
RDW: 14.6 % (ref 11.5–15.5)
WBC: 10.4 10*3/uL (ref 4.0–10.5)

## 2012-04-19 LAB — COMPREHENSIVE METABOLIC PANEL
ALT: 10 U/L (ref 0–35)
AST: 20 U/L (ref 0–37)
Albumin: 2.9 g/dL — ABNORMAL LOW (ref 3.5–5.2)
Alkaline Phosphatase: 65 U/L (ref 39–117)
BUN: 21 mg/dL (ref 6–23)
Chloride: 99 mEq/L (ref 96–112)
Potassium: 3.7 mEq/L (ref 3.5–5.1)
Sodium: 138 mEq/L (ref 135–145)
Total Bilirubin: 0.9 mg/dL (ref 0.3–1.2)

## 2012-04-19 LAB — GLUCOSE, CAPILLARY
Glucose-Capillary: 148 mg/dL — ABNORMAL HIGH (ref 70–99)
Glucose-Capillary: 138 mg/dL — ABNORMAL HIGH (ref 70–99)

## 2012-04-19 LAB — BASIC METABOLIC PANEL
CO2: 27 mEq/L (ref 19–32)
Chloride: 100 mEq/L (ref 96–112)
Creatinine, Ser: 0.88 mg/dL (ref 0.50–1.10)
Glucose, Bld: 137 mg/dL — ABNORMAL HIGH (ref 70–99)

## 2012-04-19 SURGERY — OPEN REDUCTION INTERNAL FIXATION (ORIF) ACETABULAR FRACTURE
Anesthesia: General | Site: Hip | Laterality: Left | Wound class: Clean

## 2012-04-19 MED ORDER — ENOXAPARIN SODIUM 80 MG/0.8ML ~~LOC~~ SOLN
80.0000 mg | Freq: Two times a day (BID) | SUBCUTANEOUS | Status: DC
  Administered 2012-04-20 – 2012-04-27 (×15): 80 mg via SUBCUTANEOUS
  Filled 2012-04-19 (×19): qty 0.8

## 2012-04-19 MED ORDER — ROCURONIUM BROMIDE 100 MG/10ML IV SOLN
INTRAVENOUS | Status: DC | PRN
  Administered 2012-04-19: 50 mg via INTRAVENOUS
  Administered 2012-04-19: 10 mg via INTRAVENOUS
  Administered 2012-04-19: 20 mg via INTRAVENOUS
  Administered 2012-04-19 (×3): 10 mg via INTRAVENOUS

## 2012-04-19 MED ORDER — FENTANYL CITRATE 0.05 MG/ML IJ SOLN
INTRAMUSCULAR | Status: DC | PRN
  Administered 2012-04-19: 100 ug via INTRAVENOUS
  Administered 2012-04-19 (×2): 50 ug via INTRAVENOUS

## 2012-04-19 MED ORDER — MORPHINE SULFATE 2 MG/ML IJ SOLN
1.0000 mg | INTRAMUSCULAR | Status: DC | PRN
  Administered 2012-04-19: 1 mg via INTRAVENOUS
  Administered 2012-04-20: 2 mg via INTRAVENOUS
  Administered 2012-04-20 – 2012-04-21 (×3): 1 mg via INTRAVENOUS
  Filled 2012-04-19 (×5): qty 1

## 2012-04-19 MED ORDER — SODIUM CHLORIDE 0.9 % IR SOLN
Status: DC | PRN
  Administered 2012-04-19: 3000 mL

## 2012-04-19 MED ORDER — ONDANSETRON HCL 4 MG/2ML IJ SOLN
INTRAMUSCULAR | Status: DC | PRN
  Administered 2012-04-19: 4 mg via INTRAVENOUS

## 2012-04-19 MED ORDER — OXYCODONE HCL 5 MG PO TABS
5.0000 mg | ORAL_TABLET | Freq: Four times a day (QID) | ORAL | Status: DC | PRN
  Administered 2012-04-21: 5 mg via ORAL
  Filled 2012-04-19: qty 1

## 2012-04-19 MED ORDER — LACTATED RINGERS IV SOLN
INTRAVENOUS | Status: DC
  Administered 2012-04-19: 20:00:00 via INTRAVENOUS
  Administered 2012-04-20: 100 mL/h via INTRAVENOUS
  Administered 2012-04-20 – 2012-04-23 (×4): via INTRAVENOUS

## 2012-04-19 MED ORDER — CEFAZOLIN SODIUM-DEXTROSE 2-3 GM-% IV SOLR
2.0000 g | Freq: Once | INTRAVENOUS | Status: AC
  Administered 2012-04-19: 2 g via INTRAVENOUS
  Filled 2012-04-19: qty 50

## 2012-04-19 MED ORDER — 0.9 % SODIUM CHLORIDE (POUR BTL) OPTIME
TOPICAL | Status: DC | PRN
  Administered 2012-04-19: 1000 mL

## 2012-04-19 MED ORDER — LACTATED RINGERS IV SOLN
INTRAVENOUS | Status: DC | PRN
  Administered 2012-04-19 (×3): via INTRAVENOUS

## 2012-04-19 MED ORDER — GLYCOPYRROLATE 0.2 MG/ML IJ SOLN
INTRAMUSCULAR | Status: DC | PRN
  Administered 2012-04-19: 0.6 mg via INTRAVENOUS

## 2012-04-19 MED ORDER — ACETAMINOPHEN 10 MG/ML IV SOLN
1000.0000 mg | Freq: Four times a day (QID) | INTRAVENOUS | Status: AC
  Administered 2012-04-19 – 2012-04-20 (×4): 1000 mg via INTRAVENOUS
  Filled 2012-04-19 (×5): qty 100

## 2012-04-19 MED ORDER — PHENYLEPHRINE HCL 10 MG/ML IJ SOLN
INTRAMUSCULAR | Status: DC | PRN
  Administered 2012-04-19: 80 ug via INTRAVENOUS
  Administered 2012-04-19 (×3): 120 ug via INTRAVENOUS
  Administered 2012-04-19 (×2): 40 ug via INTRAVENOUS
  Administered 2012-04-19: 120 ug via INTRAVENOUS
  Administered 2012-04-19: 80 ug via INTRAVENOUS

## 2012-04-19 MED ORDER — NEOSTIGMINE METHYLSULFATE 1 MG/ML IJ SOLN
INTRAMUSCULAR | Status: DC | PRN
  Administered 2012-04-19: 4 mg via INTRAVENOUS

## 2012-04-19 MED ORDER — PROMETHAZINE HCL 25 MG/ML IJ SOLN: 6.2500 mg | INTRAMUSCULAR | Status: DC | PRN

## 2012-04-19 MED ORDER — LACTATED RINGERS IV BOLUS (SEPSIS)
300.0000 mL | Freq: Once | INTRAVENOUS | Status: AC
  Administered 2012-04-19: 300 mL via INTRAVENOUS

## 2012-04-19 MED ORDER — CEFAZOLIN SODIUM 1-5 GM-% IV SOLN
1.0000 g | Freq: Three times a day (TID) | INTRAVENOUS | Status: AC
  Administered 2012-04-19 – 2012-04-20 (×3): 1 g via INTRAVENOUS
  Filled 2012-04-19 (×3): qty 50

## 2012-04-19 MED ORDER — HYDROMORPHONE HCL PF 1 MG/ML IJ SOLN: 0.2500 mg | INTRAMUSCULAR | Status: DC | PRN

## 2012-04-19 MED ORDER — LACTATED RINGERS IV SOLN
INTRAVENOUS | Status: DC
  Administered 2012-04-19: 12:00:00 via INTRAVENOUS

## 2012-04-19 MED ORDER — PROPOFOL 10 MG/ML IV BOLUS
INTRAVENOUS | Status: DC | PRN
  Administered 2012-04-19: 130 mg via INTRAVENOUS

## 2012-04-19 SURGICAL SUPPLY — 85 items
APPLIER CLIP 11 MED OPEN (CLIP)
APR CLP MED 11 20 MLT OPN (CLIP)
BIT DRILL AO MATTA 2.5MX230M (BIT) IMPLANT
BLADE SURG ROTATE 9660 (MISCELLANEOUS) IMPLANT
BONE CANC CHIPS 20CC PCAN1/4 (Bone Implant) ×2 IMPLANT
BRUSH SCRUB DISP (MISCELLANEOUS) ×4 IMPLANT
CHIPS CANC BONE 20CC PCAN1/4 (Bone Implant) ×1 IMPLANT
CLIP APPLIE 11 MED OPEN (CLIP) ×1 IMPLANT
CLOTH BEACON ORANGE TIMEOUT ST (SAFETY) ×2 IMPLANT
CLSR STERI-STRIP ANTIMIC 1/2X4 (GAUZE/BANDAGES/DRESSINGS) ×1 IMPLANT
COVER SURGICAL LIGHT HANDLE (MISCELLANEOUS) ×3 IMPLANT
DRAIN CHANNEL 10F 3/8 F FF (DRAIN) ×2 IMPLANT
DRAIN CHANNEL 15F RND FF W/TCR (WOUND CARE) IMPLANT
DRAPE C-ARM 42X72 X-RAY (DRAPES) ×1 IMPLANT
DRAPE C-ARMOR (DRAPES) ×2 IMPLANT
DRAPE INCISE IOBAN 66X45 STRL (DRAPES) ×3 IMPLANT
DRAPE INCISE IOBAN 85X60 (DRAPES) ×2 IMPLANT
DRAPE ORTHO SPLIT 77X108 STRL (DRAPES) ×4
DRAPE SURG ORHT 6 SPLT 77X108 (DRAPES) ×2 IMPLANT
DRAPE U-SHAPE 47X51 STRL (DRAPES) ×2 IMPLANT
DRILL BIT AO MATTA 2.5MX230M (BIT) ×2
DRSG ADAPTIC 3X8 NADH LF (GAUZE/BANDAGES/DRESSINGS) ×2 IMPLANT
DRSG MEPILEX BORDER 4X12 (GAUZE/BANDAGES/DRESSINGS) ×1 IMPLANT
DRSG PAD ABDOMINAL 8X10 ST (GAUZE/BANDAGES/DRESSINGS) ×3 IMPLANT
ELECT BLADE 6.5 EXT (BLADE) ×1 IMPLANT
ELECT REM PT RETURN 9FT ADLT (ELECTROSURGICAL) ×2
ELECTRODE REM PT RTRN 9FT ADLT (ELECTROSURGICAL) ×1 IMPLANT
EVACUATOR 1/8 PVC DRAIN (DRAIN) IMPLANT
EVACUATOR SILICONE 100CC (DRAIN) ×2 IMPLANT
GAUZE SPONGE 4X4 16PLY XRAY LF (GAUZE/BANDAGES/DRESSINGS) ×2 IMPLANT
GLOVE BIO SURGEON STRL SZ7 (GLOVE) ×4 IMPLANT
GLOVE BIO SURGEON STRL SZ7.5 (GLOVE) ×3 IMPLANT
GLOVE BIO SURGEON STRL SZ8 (GLOVE) ×2 IMPLANT
GLOVE BIOGEL PI IND STRL 7.5 (GLOVE) ×1 IMPLANT
GLOVE BIOGEL PI IND STRL 8 (GLOVE) ×1 IMPLANT
GLOVE BIOGEL PI INDICATOR 7.5 (GLOVE) ×1
GLOVE BIOGEL PI INDICATOR 8 (GLOVE) ×1
GLOVE SURG SS PI 7.0 STRL IVOR (GLOVE) ×2 IMPLANT
GOWN PREVENTION PLUS XLARGE (GOWN DISPOSABLE) ×2 IMPLANT
GOWN PREVENTION PLUS XXLARGE (GOWN DISPOSABLE) ×2 IMPLANT
GOWN STRL NON-REIN LRG LVL3 (GOWN DISPOSABLE) ×4 IMPLANT
GRAFT BNE CANC CHIPS 1-8 20CC (Bone Implant) IMPLANT
HANDPIECE INTERPULSE COAX TIP (DISPOSABLE) ×2
KIT BASIN OR (CUSTOM PROCEDURE TRAY) ×2 IMPLANT
KIT ROOM TURNOVER OR (KITS) ×2 IMPLANT
LIGHT ORTHO (MISCELLANEOUS) ×2 IMPLANT
LOOP VESSEL MAXI BLUE (MISCELLANEOUS) IMPLANT
MANIFOLD NEPTUNE II (INSTRUMENTS) ×2 IMPLANT
NDL MAYO TROCAR (NEEDLE) ×1 IMPLANT
NEEDLE MAYO TROCAR (NEEDLE) ×2 IMPLANT
NS IRRIG 1000ML POUR BTL (IV SOLUTION) ×2 IMPLANT
PACK TOTAL JOINT (CUSTOM PROCEDURE TRAY) ×2 IMPLANT
PAD ARMBOARD 7.5X6 YLW CONV (MISCELLANEOUS) ×4 IMPLANT
PILLOW ABDUCTION HIP (SOFTGOODS) ×1 IMPLANT
PLATE ACET STRT 46.5M 4H (Plate) ×1 IMPLANT
PLATE ACET STRT 94.5M 8H (Plate) ×1 IMPLANT
RETRIEVER SUT HEWSON (MISCELLANEOUS) ×2 IMPLANT
SCREW CORTEX ST MATTA 3.5X16MM (Screw) ×2 IMPLANT
SCREW CORTEX ST MATTA 3.5X20 (Screw) ×1 IMPLANT
SCREW CORTEX ST MATTA 3.5X24 (Screw) ×1 IMPLANT
SCREW CORTEX ST MATTA 3.5X30MM (Screw) ×1 IMPLANT
SCREW CORTEX ST MATTA 3.5X32MM (Screw) ×1 IMPLANT
SCREW CORTEX ST MATTA 3.5X34MM (Screw) ×2 IMPLANT
SCREW CORTEX ST MATTA 3.5X38M (Screw) ×1 IMPLANT
SCREW CORTEX ST MATTA 3.5X50MM (Screw) ×1 IMPLANT
SET HNDPC FAN SPRY TIP SCT (DISPOSABLE) ×1 IMPLANT
SLEEVE SURGEON STRL (DRAPES) ×1 IMPLANT
SPONGE GAUZE 4X4 12PLY (GAUZE/BANDAGES/DRESSINGS) ×2 IMPLANT
SPONGE LAP 18X18 X RAY DECT (DISPOSABLE) ×4 IMPLANT
STAPLER VISISTAT 35W (STAPLE) ×2 IMPLANT
STRIP CLOSURE SKIN 1/2X4 (GAUZE/BANDAGES/DRESSINGS) ×2 IMPLANT
SUCTION FRAZIER TIP 10 FR DISP (SUCTIONS) ×2 IMPLANT
SUT FIBERWIRE #2 38 T-5 BLUE (SUTURE) ×4
SUT VIC AB 0 CT1 27 (SUTURE) ×2
SUT VIC AB 0 CT1 27XBRD ANBCTR (SUTURE) ×4 IMPLANT
SUT VIC AB 1 CT1 18XCR BRD 8 (SUTURE) ×1 IMPLANT
SUT VIC AB 1 CT1 8-18 (SUTURE) ×2
SUT VIC AB 2-0 CT1 27 (SUTURE) ×2
SUT VIC AB 2-0 CT1 TAPERPNT 27 (SUTURE) ×4 IMPLANT
SUTURE FIBERWR #2 38 T-5 BLUE (SUTURE) ×2 IMPLANT
TAPE CLOTH SURG 6X10 WHT LF (GAUZE/BANDAGES/DRESSINGS) ×1 IMPLANT
TOWEL OR 17X24 6PK STRL BLUE (TOWEL DISPOSABLE) ×2 IMPLANT
TOWEL OR 17X26 10 PK STRL BLUE (TOWEL DISPOSABLE) ×4 IMPLANT
TRAY FOLEY CATH 14FR (SET/KITS/TRAYS/PACK) ×1 IMPLANT
WATER STERILE IRR 1000ML POUR (IV SOLUTION) ×8 IMPLANT
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
## 2012-04-19 NOTE — Progress Notes (Signed)
Pt was not compliant with her CPAP. She refused to wear the hospital CPAP and her home CPAP. Pt seemed very concerned about the delivery system of the hospital. RN aware
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
## 2012-04-19 NOTE — Anesthesia Postprocedure Evaluation (Signed)
  Anesthesia Post-op Note  Patient: [name]  Procedure(s) Performed: Procedure(s): OPEN REDUCTION INTERNAL FIXATION (ORIF) ACETABULAR FRACTURE (Left)  Patient Location: PACU  Anesthesia Type:General  Level of Consciousness: awake  Airway and Oxygen Therapy: Patient Spontanous Breathing  Post-op Pain: mild  Post-op Assessment: Post-op Vital signs reviewed  Post-op Vital Signs: Reviewed  Complications: No apparent anesthesia complications

## 2012-04-19 NOTE — Progress Notes (Signed)
Physician on call for Dr.Norrins was called and notified that patient no IV access and refuses restart.

## 2012-04-19 NOTE — Progress Notes (Signed)
MD on call paged for BP 80/39; Spoke with Dr. Alveda Reasons; fluids increased to 125; MD stated he would get in touch with Internal Medicine to come and see the patient.  Bp is now 84/39; will continue to monitor.  Vivi Martens, RN

## 2012-04-19 NOTE — Anesthesia Preprocedure Evaluation (Signed)
Anesthesia Evaluation    Reviewed: Allergy & Precautions, H&P , NPO status , Patient's Chart, lab work & pertinent test results  History of Anesthesia Complications Negative for: history of anesthetic complications  Airway       Dental   Pulmonary sleep apnea , COPD COPD inhaler,          Cardiovascular + [name], + Peripheral Vascular Disease and +CHF + dysrhythmias Atrial Fibrillation + pacemaker     Neuro/Psych PSYCHIATRIC DISORDERS CVA    GI/Hepatic GERD-  Medicated,  Endo/Other  diabetes, Oral Hypoglycemic Agents  Renal/GU Renal disease     Musculoskeletal   Abdominal   Peds  Hematology negative hematology ROS (+)   Anesthesia Other Findings   Reproductive/Obstetrics                           Anesthesia Physical Anesthesia Plan  ASA: III  Anesthesia Plan: General   Post-op Pain Management:    Induction: Intravenous  Airway Management Planned: Oral ETT  Additional Equipment:   Intra-op Plan:   Post-operative Plan: Extubation in OR  Informed Consent: I have reviewed the patients History and Physical, chart, labs and discussed the procedure including the risks, benefits and alternatives for the proposed anesthesia with the patient or authorized representative who has indicated his/her understanding and acceptance.   Dental advisory given  Plan Discussed with: CRNA, Anesthesiologist and Surgeon  Anesthesia Plan Comments:         Anesthesia Quick Evaluation

## 2012-04-19 NOTE — Progress Notes (Signed)
Second bladder scan was done to see if patient was retaining any urine. Scan showed 598 ml of urine. On call MD for Dr.Norris was called and notified. New orders given.

## 2012-04-19 NOTE — Clinical Social Work Placement (Addendum)
    Clinical Social Work Department CLINICAL SOCIAL WORK PLACEMENT NOTE 04/19/2012  Patient:  [name], [name]  Account Number:  [id number] Admit date:  04/14/2012  Clinical Social Worker:  Lupita Leash CROWDER, LCSWA  Date/time:  04/19/2012 05:57 PM  Clinical Social Work is seeking post-discharge placement for this patient at the following level of care:   SKILLED NURSING   (*CSW will update this form in Epic as items are completed)   04/19/2012  Patient/family provided with [facility] Department of Clinical Social Work's list of facilities offering this level of care within the geographic area requested by the patient (or if unable, by the patient's family).  04/19/2012  Patient/family informed of their freedom to choose among providers that offer the needed level of care, that participate in Medicare, Medicaid or managed care program needed by the patient, have an available bed and are willing to accept the patient.    Patient/family informed of [facility]' ownership interest in [facility], as well as of the fact that they are under no obligation to receive care at this facility.  PASARR submitted to [employer] on 04/19/12 PASARR number received from [employer] on 04/19/12 PASARR# [id number]  FL2 transmitted to all facilities in geographic area requested by pt/family on  04/19/12 FL2 transmitted to all facilities within larger geographic area on   Patient informed that his/her managed care company has contracts with or will negotiate with  certain facilities, including the following:     Patient/family informed of bed offers received:  Patient chooses bed at  [facility]  Physician recommends and patient chooses bed at    Patient to be transferred to  on  04/27/12 Patient to be transferred to facility by [facility]  The following physician request were entered in Epic:   Additional Comments:  Sabino Niemann, MSW, [phone]  Signing off

## 2012-04-19 NOTE — Progress Notes (Signed)
Occupational Therapy Treatment Patient Details Name: [name] MRN: [id number] DOB: 08-25-1931 Today's Date: 04/19/2012 Time: 6295-2841 OT Time Calculation (min): 12 min  OT Assessment / Plan / Recommendation Comments on Treatment Session So far pt tolerating splint that was fabricated for her yesterday, will continue to monitor.    Follow Up Recommendations  SNF       Equipment Recommendations  None recommended by OT       Frequency Min 3X/week   Plan Discharge plan remains appropriate    Precautions / Restrictions Precautions Precautions: Fall Other Brace/Splint: LUE sling whne up and thumb spica splint all the time Restrictions Weight Bearing Restrictions: Yes LUE Weight Bearing: Non weight bearing LLE Weight Bearing: Non weight bearing   Pertinent Vitals/Pain No co pain in LUE    ADL  ADL Comments: Splint check session. Removed splint and there were no reddened areas anywhere. Pt still has the two blisters one on volar surface of forearm and the other on dorsum of forearm--unchanged since yesterday. Stockingette reapplied as well as thumb spica splint. Arm propped back up in a slinged position (versus the dependent position it was found in)      OT Goals ADL Goals ADL Goal: Additional Goal #1 - Progress: Progressing toward goals  Visit Information  Last OT Received On: 04/19/12 Assistance Needed: +2 (for lateral transfers)    Subjective Data  Subjective: It feels fine, just heavy (her splint)                  End of Session OT - End of Session Activity Tolerance: Patient tolerated treatment well Patient left: in bed;with bed alarm set Nurse Communication:  (NT--pt wants bath, very little rolling to left side)       Evette Georges [phone] 04/19/2012, 10:53 AM

## 2012-04-19 NOTE — Transfer of Care (Signed)
Immediate Anesthesia Transfer of Care Note  Patient: [name]  Procedure(s) Performed: Procedure(s): OPEN REDUCTION INTERNAL FIXATION (ORIF) ACETABULAR FRACTURE (Left)  Patient Location: PACU  Anesthesia Type:General  Level of Consciousness: lethargic and responds to stimulation  Airway & Oxygen Therapy: Patient Spontanous Breathing and Patient connected to nasal cannula oxygen  Post-op Assessment: Report given to PACU RN and Patient moving all extremities X 4  Post vital signs: Reviewed and stable  Complications: No apparent anesthesia complications

## 2012-04-19 NOTE — Progress Notes (Signed)
In and out cath done and 800 ml of urine removed.
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
## 2012-04-19 NOTE — Brief Op Note (Signed)
04/14/2012 - 04/19/2012  4:40 PM  PATIENT:  [name]  79 y.o. female  PRE-OPERATIVE DIAGNOSIS:  left transverse/posterior wall acetabular fracture  POST-OPERATIVE DIAGNOSIS:  left transverse/posterior wall acetabular fracture  PROCEDURE:  Procedure(s): OPEN REDUCTION INTERNAL FIXATION (ORIF) ACETABULAR FRACTURE (Left) transverse posterior wall  SURGEON:  Surgeon(s) and Role:    * Budd Palmer, MD - Primary  PHYSICIAN ASSISTANT: Montez Morita, [facility]  ANESTHESIA:   general  EBL:  Total I/O In: 1000 [I.V.:1000] Out: 980 [Urine:680; Blood:300]  BLOOD ADMINISTERED:none  DRAINS: none   LOCAL MEDICATIONS USED:  NONE  SPECIMEN:  No Specimen  DISPOSITION OF SPECIMEN:  N/A  COUNTS:  YES  TOURNIQUET:  * No tourniquets in log *  DICTATION: .Other Dictation: Dictation Number [id number]  PLAN OF CARE: Admit to inpatient   PATIENT DISPOSITION:  PACU - hemodynamically stable.   Delay start of Pharmacological VTE agent (>24hrs) due to surgical blood loss or risk of bleeding: no

## 2012-04-19 NOTE — Progress Notes (Signed)
Patient IV site had to be discontinued since IV was leaking around the site and was also outdated. Patient refused to have IV site restarted by nursing staff. Patient was told and also encouraged about the importance of having IV access. Patient initially agreed to have IV restart done by IV team, so IV team was called to restart. Nurse with IV team reported to me that patient has also refused IV restart. Will contact physician to notify.

## 2012-04-19 NOTE — Clinical Social Work Psychosocial (Signed)
     Clinical Social Work Department BRIEF PSYCHOSOCIAL ASSESSMENT 04/19/2012  Patient:  [name], [name]     Account Number:  [id number]     Admit date:  04/14/2012  Clinical Social Worker:  Tiburcio Pea  Date/Time:  04/19/2012 05:46 PM  Referred by:  Physician  Date Referred:  04/18/2012 Referred for  Substance Abuse   Other Referral:   Interview type:  Family Other interview type:   [name]- Daughter in law  [phone]    PSYCHOSOCIAL DATA Living Status:  HUSBAND Admitted from facility:   Level of care:   Primary support name:  [name]  - Primary support relationship to patient:  CHILD, ADULT Degree of support available:   Strong support. Son travels. His wife is [name]- [name]  Pager [phone]-- she will be primary contact for patient's son.    Husband:  [name]-- has mild dementia "forgetful" per Dr. Mayford Knife    CURRENT CONCERNS Current Concerns  Post-Acute Placement   Other Concerns:    SOCIAL WORK ASSESSMENT / PLAN Patient lives at home with her husband [name].  She has been in surgery today so CSW has been unable to actually meet patient. Physical Therapy has evaluated patient and is recommending short term SNF. CSW spoke to patient's daughter in law-  [name] who stated that patient had been managing at home but her husband would not be able to care for her after surgery. He has mild dementia and is "forgetful".  Family is in agreement with short term SNF plan.  CSW will complete FL2 and place on chart for MD's signature.  Bed search process discussed with Dr. Lambert Mody- family's first preferences are [facility] and then [facility].   Assessment/plan status:  Psychosocial Support/Ongoing Assessment of Needs Other assessment/ plan:   Information/referral to community resources:   SNF bed list placed in patient's room for family's review.    PATIENTS/FAMILYS RESPONSE TO PLAN OF CARE: Patient is currently in  surgery so CSW was unable to meet her.  Patient's family is supportive of SNF placement and agreed to bed search.  CSW will need to follow up with family and patient regarding bed availability.

## 2012-04-19 NOTE — Progress Notes (Signed)
Pt noncompliant with her CPAP. She refused to wear the hospital CPAP and her home CPAP. RT will continue to monitor.

## 2012-04-19 NOTE — Progress Notes (Signed)
Spoke with Dr. Kevan Ny regarding BP; orders received to administer a 300 cc bolus of LR now and make sure there is a CBC and Bmet ordered for the morning.  Wants BP systolic in the 90's; will continue to  Monitor.  Vivi Martens RN

## 2012-04-19 NOTE — Preoperative (Signed)
Beta Blockers   Reason not to administer Beta Blockers:took atenolol today

## 2012-04-19 NOTE — Progress Notes (Signed)
Subjective: CIR consult appreciated - not a candidate due to limitations in mobility. PT/OT recommend SNF. Urinary retention noted as well as patient refusal to restart IV. Per RN Dr. Carola Frost (no note on the chart) called in a NPO p/ MN with possible surgery today - no information about plans for repair but assume it is for her left hand or possibly acetabulum left.   Patient admits to mild confusion during the night: disoriented and frightened by staff coming into her room late. She is more oriented this AM    Objective: Lab: Lab Results  Component Value Date   WBC 10.5 04/17/2012   HGB 10.6* 04/17/2012   HCT 31.2* 04/17/2012   MCV 88.4 04/17/2012   PLT 156 04/17/2012   BMET    Component Value Date/Time   NA 134* 04/16/2012 0505   K 3.1* 04/16/2012 0505   CL 98 04/16/2012 0505   CO2 26 04/16/2012 0505   GLUCOSE 172* 04/16/2012 0505   BUN 14 04/16/2012 0505   BUN 18 07/14/2010 0922   CREATININE 0.84 04/16/2012 0505   CREATININE 1.0 07/14/2010 0922   CALCIUM 8.3* 04/16/2012 0505   GFRNONAA 65* 04/16/2012 0505   GFRAA 75* 04/16/2012 0505     Imaging: no new imaging  Scheduled Meds: . atenolol  25 mg Oral Daily  . atorvastatin  20 mg Oral q1800  . celecoxib  200 mg Oral Daily  . cilostazol  100 mg Oral QHS  . enoxaparin (LOVENOX) injection  80 mg Subcutaneous Q12H  . ferrous sulfate  325 mg Oral Q breakfast  . fluticasone  2 spray Each Nare QHS  . furosemide  80 mg Oral BID  . guaiFENesin  1,200 mg Oral BID  . letrozole  2.5 mg Oral Daily  . magnesium hydroxide  30 mL Oral QHS  . magnesium oxide  200 mg Oral Daily  . metFORMIN  500 mg Oral Q breakfast  . mometasone-formoterol  2 puff Inhalation BID  . niacin  500 mg Oral BID WC  . pantoprazole  40 mg Oral Daily  . potassium chloride SA  40 mEq Oral QHS  . sodium chloride  3 mL Intravenous Q12H  . tiotropium  18 mcg Inhalation Daily  . traMADol  50 mg Oral Q8H  . vitamin C  250 mg Oral Q M,W,F  . vitamin E  100 Units Oral 2 times weekly    Continuous Infusions:  PRN Meds:.acetaminophen, acetaminophen, ALPRAZolam, alum & mag hydroxide-simeth, levalbuterol, ondansetron (ZOFRAN) IV, ondansetron, oxyCODONE, polyethylene glycol   Physical Exam: Filed Vitals:   04/19/12 0623  BP: 106/44  Pulse: 69  Temp: 98.3 F (36.8 C)  Resp: 20   Gen'l - WNWD elderly white woman in no distress but worried HEENT- C&S clear Neck - supple Cor - distant heart sounds Pulm - normal respirations w/o wheezing or rales Abd - soft, BS+ Ext - decreased swelling in the left hand, splint in place. Neuro - oriented to person, place and examiner, Admits to some confusion and disorientation last night.      Assessment/Plan: 1. Ortho - decreased swelling in the left hand. Dr. Carola Frost is evidently going to see the patient today - she is NPO for procedure. She is reassured that any recommendation for surgery will be sound. PT/OT eval noted.   PLan Trauma ortho to consult today  For SNF for rehad due to inability to bear weight  2. Pulm - stable  3. Hypersomnolence - this has not been a problem  in the hospital  4. A./ Fib - heart sounds are distant. Heart rate is controlled.  5. DM -  CBG (last 3)   Recent Labs  04/18/12 1122 04/18/12 1609 04/19/12 0556  GLUCAP 132* 130* 144*    Good control.  6. GU - episode of urinary retention noted. No suprapubic tenderness this AM  7. Delerium - mild delerium and disorientation. Does not completely grasp her limitations due to her injuries.  Spent time orienting her. She is willing to have IV start.   Will order labs for this AM prior to any potential procedure.    Illene Regulus [facility] (o) [phone]; (c) [phone] Call-grp - [facility]  Tele: [phone]  04/19/2012, 7:19 AM

## 2012-04-19 NOTE — Progress Notes (Signed)
1030 had not voided bladder not distended no no urge  to urinate . Going to or . Bladder scan done 83-104 mll . Endorsed to [employer], OR RN . Dr Mayford Knife and family in attendace . TO or via  bed with RN and tech . On 02 2l/m [place] . Marland Kitchen

## 2012-04-20 ENCOUNTER — Encounter (HOSPITAL_COMMUNITY): Payer: Self-pay | Admitting: Orthopedic Surgery

## 2012-04-20 DIAGNOSIS — E559 Vitamin D deficiency, unspecified: Secondary | ICD-10-CM | POA: Diagnosis present

## 2012-04-20 DIAGNOSIS — S32402A Unspecified fracture of left acetabulum, initial encounter for closed fracture: Secondary | ICD-10-CM

## 2012-04-20 DIAGNOSIS — M8580 Other specified disorders of bone density and structure, unspecified site: Secondary | ICD-10-CM | POA: Diagnosis present

## 2012-04-20 DIAGNOSIS — D62 Acute posthemorrhagic anemia: Secondary | ICD-10-CM | POA: Diagnosis not present

## 2012-04-20 HISTORY — DX: Acute posthemorrhagic anemia: D62

## 2012-04-20 LAB — GLUCOSE, CAPILLARY: Glucose-Capillary: 125 mg/dL — ABNORMAL HIGH (ref 70–99)

## 2012-04-20 LAB — BASIC METABOLIC PANEL
Calcium: 8.5 mg/dL (ref 8.4–10.5)
Creatinine, Ser: 0.93 mg/dL (ref 0.50–1.10)
GFR calc Af Amer: 66 mL/min — ABNORMAL LOW (ref >90)
GFR calc non Af Amer: 57 mL/min — ABNORMAL LOW (ref >90)
Sodium: 136 mEq/L (ref 135–145)

## 2012-04-20 LAB — HEMOGLOBIN AND HEMATOCRIT, BLOOD
HCT: 24.5 % — ABNORMAL LOW (ref 36.0–46.0)
Hemoglobin: 8.2 g/dL — ABNORMAL LOW (ref 12.0–15.0)

## 2012-04-20 LAB — CBC
MCH: 29.8 pg (ref 26.0–34.0)
MCHC: 33.3 g/dL (ref 30.0–36.0)
Platelets: 190 10*3/uL (ref 150–400)
RDW: 14.9 % (ref 11.5–15.5)

## 2012-04-20 LAB — MRSA PCR SCREENING: MRSA by PCR: NEGATIVE

## 2012-04-20 MED ORDER — BIOTENE DRY MOUTH MT LIQD: 15.0000 mL | OROMUCOSAL | Status: DC | PRN

## 2012-04-20 MED ORDER — BIOTENE DRY MOUTH MT LIQD
15.0000 mL | Freq: Two times a day (BID) | OROMUCOSAL | Status: DC
  Administered 2012-04-20 – 2012-04-27 (×14): 15 mL via OROMUCOSAL

## 2012-04-20 MED ORDER — GLUCERNA SHAKE PO LIQD
237.0000 mL | Freq: Three times a day (TID) | ORAL | Status: DC
  Administered 2012-04-20 – 2012-04-24 (×7): 237 mL via ORAL

## 2012-04-20 MED ORDER — FUROSEMIDE 80 MG PO TABS
80.0000 mg | ORAL_TABLET | Freq: Every day | ORAL | Status: DC
  Administered 2012-04-20 – 2012-04-27 (×8): 80 mg via ORAL
  Filled 2012-04-20 (×9): qty 1

## 2012-04-20 MED ORDER — VITAMIN D3 25 MCG (1000 UNIT) PO TABS
1000.0000 [IU] | ORAL_TABLET | Freq: Every day | ORAL | Status: DC
  Administered 2012-04-20 – 2012-04-24 (×5): 1000 [IU] via ORAL
  Filled 2012-04-20 (×6): qty 1

## 2012-04-20 MED ORDER — METHOCARBAMOL 500 MG PO TABS
500.0000 mg | ORAL_TABLET | Freq: Four times a day (QID) | ORAL | Status: DC | PRN
Start: 2012-04-20 — End: 2012-04-27
  Administered 2012-04-22 – 2012-04-26 (×2): 500 mg via ORAL
  Filled 2012-04-20 (×4): qty 1

## 2012-04-20 NOTE — Progress Notes (Signed)
Patient has home CPAP and will place on herself. RT will continue to monitor.

## 2012-04-20 NOTE — Progress Notes (Signed)
Patient has changed her mind and decided to wear her home CPAP. 2L oxygen bled in, tolerating well at this time. RT will continue to monitor.

## 2012-04-20 NOTE — Progress Notes (Signed)
Dr. Kevan Ny paged again regarding BP; BP after bolus was 97/79; now systolic in the 70's; manual bp 72/30; adequate urine output and patient mentating well; received order to increase fluids from 125 to 150; will continue to monitor.  BP is now currently 82/47; will continue to monitor.  Vivi Martens RN

## 2012-04-20 NOTE — Progress Notes (Signed)
Utilization review completed.  
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
## 2012-04-20 NOTE — Progress Notes (Signed)
Pt.'s splint removed to assess left hand/thumb/arm.  Bruised, and blister present.  Patient's family member states blister was already present.  Radial pulse strong; capillary refill less than 3 seconds.  Splint replaced; will continue to monitor.  Vivi Martens RN
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
## 2012-04-20 NOTE — Progress Notes (Signed)
Bp now currently 95/54; will continue to monitor  Vivi Martens RN
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
## 2012-04-20 NOTE — Progress Notes (Signed)
Occupational Therapy Treatment Patient Details Name: [name] MRN: [id number] DOB: 31-Jan-1932 Today's Date: 04/20/2012 Time: 4401-0272 OT Time Calculation (min): 41 min  OT Assessment / Plan / Recommendation Comments on Treatment Session No problems noted with splint. stockinette replaced. Educated pt on importance of getting OOB. Pt agreed to get OOB tomorrow. Family present and verbalized understanding.     Follow Up Recommendations  SNF    Barriers to Discharge       Equipment Recommendations  None recommended by OT    Recommendations for Other Services    Frequency Min 3X/week   Plan Discharge plan remains appropriate    Precautions / Restrictions Precautions Precautions: Fall;Posterior Hip Precaution Booklet Issued: Yes (comment) Precaution Comments: posterior hip precautions sheet and posterior hip exercises given to pt Required Braces or Orthoses: Other Brace/Splint Other Brace/Splint: LUE sling whne up and thumb spica splint all the time Restrictions Weight Bearing Restrictions: Yes LUE Weight Bearing: Non weight bearing LLE Weight Bearing: Touchdown weight bearing Other Position/Activity Restrictions: No order written, but L UE maintained NWBing.     Pertinent Vitals/Pain C/o pain. Pain meds given    ADL  ADL Comments: Slint removed. No problems noted. stokinette replaced. L elbow AAROM. encouraged digit ROM. LUE elevated on several pillows. Ice applied aat end of session. RUE strengthening. general L LE ROM  to work on bed mobility. RLE AROM .Worked on boosting. Pt recalled 2/3 hip precautions    OT Diagnosis:    OT Problem List:   OT Treatment Interventions:     OT Goals Acute Rehab OT Goals OT Goal Formulation: With patient Time For Goal Achievement: 04/29/12 Potential to Achieve Goals: Good ADL Goals Pt Will Perform Grooming: with set-up;with supervision;Unsupported;Sitting, edge of bed ADL Goal: Grooming - Progress: Progressing toward goals Pt  Will Transfer to Toilet: with min assist;Squat pivot transfer;Stand pivot transfer;3-in-1;Maintaining weight bearing status Additional ADL Goal #1: Pt will tolerate splint wear per schedule - Lt. thumb spica splint ADL Goal: Additional Goal #1 - Progress: Progressing toward goals Additional ADL Goal #2: Pt/family will be independent with splint wear and care ADL Goal: Additional Goal #2 - Progress: Progressing toward goals Miscellaneous OT Goals Miscellaneous OT Goal #1: Pt will be able to roll to the right with Min A to A with BADLs with HOB flat and use of rail. OT Goal: Miscellaneous Goal #1 - Progress: Progressing toward goals Miscellaneous OT Goal #2: Pt will be able to come up from right side lying to sit with Min A and use of rail with HOB no greater than 20 degrees in prep for transfers. OT Goal: Miscellaneous Goal #2 - Progress: Progressing toward goals Miscellaneous OT Goal #3: Pt will be able to maintain standing >1 minute with min A to A with BADLs. Miscellaneous OT Goal #4: Pt will roll right with S to A with BADLs and in prep for transfers Miscellaneous OT Goal #5: Pt will come up to sit with Supervision in prep for lateral transfers OT Goal: Miscellaneous Goal #5 - Progress: Progressing toward goals  Visit Information  Last OT Received On: 04/20/12 Assistance Needed: +2    Subjective Data      Prior Functioning  Home Living Lives With: Spouse Available Help at Discharge: Family;Available 24 hours/day Type of Home: House Home Access: Stairs to enter [employer] of Steps: couple Home Layout: One level Home Adaptive Equipment: None Prior Function Level of Independence: Independent Able to Take Stairs?: Yes Driving: Yes Communication Communication: No difficulties  Cognition  Cognition Overall Cognitive Status: Impaired Area of Impairment: Attention Arousal/Alertness: Awake/alert Orientation Level: Appears intact for tasks assessed Behavior During  Session: [facility] for tasks performed Current Attention Level: Sustained Attention - Other Comments: internally distracted    Mobility  Bed Mobility Bed Mobility:  (pt refused)    Exercises  Total Joint Exercises Ankle Circles/Pumps: AROM;Both;5 reps;Supine Quad Sets: AROM;Both;10 reps;Supine Short Arc Quad: AROM;Both;10 reps;Supine Heel Slides: AROM;Both;10 reps;Supine General Exercises - Upper Extremity Shoulder Flexion: AROM;Right;10 reps Shoulder Extension: AROM;Right;10 reps;Strengthening Shoulder ABduction: AROM;Strengthening;Right;10 reps Shoulder ADduction: AROM;Strengthening;Right;10 reps Elbow Flexion: Left;AAROM;10 reps Elbow Extension: AAROM;Left;10 reps   Balance     End of Session OT - End of Session Activity Tolerance: Patient tolerated treatment well Patient left: in bed;with bed alarm set Nurse Communication: Other (comment)  GO     Naitik Hermann,HILLARY 04/20/2012, 5:35 PM [facility], OTR/L  [phone] 04/20/2012

## 2012-04-20 NOTE — Progress Notes (Signed)
Subjective: Patient awake and alert. Seems comfortable. Chart reviewed - rough night with hypotension but no chest pain or respiratory problems.  Objective: Lab: Lab Results  Component Value Date   WBC 7.4 04/20/2012   HGB 8.7* 04/20/2012   HCT 26.1* 04/20/2012   MCV 89.4 04/20/2012   PLT 190 04/20/2012   BMET    Component Value Date/Time   NA 136 04/20/2012 0640   K 4.2 04/20/2012 0640   CL 102 04/20/2012 0640   CO2 26 04/20/2012 0640   GLUCOSE 130* 04/20/2012 0640   BUN 20 04/20/2012 0640   BUN 18 07/14/2010 0922   CREATININE 0.93 04/20/2012 0640   CREATININE 1.0 07/14/2010 0922   CALCIUM 8.5 04/20/2012 0640   GFRNONAA 57* 04/20/2012 0640   GFRAA 66* 04/20/2012 0640     Imaging:  Scheduled Meds: . acetaminophen  1,000 mg Intravenous Q6H  . antiseptic oral rinse  15 mL Mouth Rinse BID  . atenolol  25 mg Oral Daily  . atorvastatin  20 mg Oral q1800  .  ceFAZolin (ANCEF) IV  1 g Intravenous Q8H  . cilostazol  100 mg Oral QHS  . enoxaparin (LOVENOX) injection  80 mg Subcutaneous Q12H  . ferrous sulfate  325 mg Oral Q breakfast  . fluticasone  2 spray Each Nare QHS  . furosemide  80 mg Oral Daily  . guaiFENesin  1,200 mg Oral BID  . letrozole  2.5 mg Oral Daily  . magnesium hydroxide  30 mL Oral QHS  . magnesium oxide  200 mg Oral Daily  . metFORMIN  500 mg Oral Q breakfast  . mometasone-formoterol  2 puff Inhalation BID  . niacin  500 mg Oral BID WC  . pantoprazole  40 mg Oral Daily  . potassium chloride SA  40 mEq Oral QHS  . sodium chloride  3 mL Intravenous Q12H  . tiotropium  18 mcg Inhalation Daily  . traMADol  50 mg Oral Q8H  . vitamin C  250 mg Oral Q M,W,F  . vitamin E  100 Units Oral 2 times weekly   Continuous Infusions: . lactated ringers 150 mL/hr at 04/20/12 0108   PRN Meds:.ALPRAZolam, alum & mag hydroxide-simeth, levalbuterol, morphine injection, ondansetron (ZOFRAN) IV, ondansetron, oxyCODONE, polyethylene glycol   Physical Exam: Filed Vitals:   04/20/12  0500  BP: 95/43  Pulse: 87  Temp:   Resp: 12   gen'l - Elderly white woman in no distress HEENT- C&S clear, PERRLA Cor - 2_ radial, RRR, III/VI systolic murmur best at RSB Pulm - unlabored respirations, no wheezing Abd- soft, BS+ Ext - left leg in foam brace, right let with ecchymosis, right hand w/o edema, left hand with mild swelling Neuro - A&O x 3     Assessment/Plan: 1. Ortho - POD#1 acetabular repair. On APAP infusion and doing well. Other fractures stable Plan PT per ortho  2. Pulm - stable  3. Hypersomnolence - for outpatient w/u  4. Cardiac/A Fib -  Regular rate. BP running a little soft. Currently getting IVF 150 cc /hr.  Plan Start weaning down fluids  5. DM -  CBG (last 3)   Recent Labs  04/19/12 1717 04/19/12 2022 04/20/12 0422  GLUCAP 138* 137* 105*    Stable  6. GU - no problems - currently with foley.  7. Delerium - oriented today and doing well.  8. Anemia - post op drop. With her multiple problems transfusion threshold is < 7.5 g  Dispo - for SNF  when stable   [employer] [facility] (o) [phone]; (c) [phone] Call-grp - [facility]  Tele: [phone]  04/20/2012, 7:44 AM
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
## 2012-04-20 NOTE — Progress Notes (Signed)
Clinical Child psychotherapist (CSW) received a call from pt daughter-in-law Dr. Mayford Knife requesting placement at either [facility] or [facility]. CSW submitting clinicals to requested facilities & will follow up with bed offers today.  Theresia Bough, MSW, Theresia Majors [phone]

## 2012-04-21 ENCOUNTER — Encounter (HOSPITAL_COMMUNITY): Payer: Self-pay | Admitting: Orthopedic Surgery

## 2012-04-21 LAB — CBC
Hemoglobin: 8 g/dL — ABNORMAL LOW (ref 12.0–15.0)
MCH: 30 pg (ref 26.0–34.0)
MCHC: 33.3 g/dL (ref 30.0–36.0)
RDW: 15.1 % (ref 11.5–15.5)

## 2012-04-21 LAB — BASIC METABOLIC PANEL
BUN: 16 mg/dL (ref 6–23)
Creatinine, Ser: 0.8 mg/dL (ref 0.50–1.10)
GFR calc non Af Amer: 69 mL/min — ABNORMAL LOW (ref >90)
Glucose, Bld: 156 mg/dL — ABNORMAL HIGH (ref 70–99)
Potassium: 3.8 mEq/L (ref 3.5–5.1)

## 2012-04-21 LAB — GLUCOSE, CAPILLARY
Glucose-Capillary: 121 mg/dL — ABNORMAL HIGH (ref 70–99)
Glucose-Capillary: 145 mg/dL — ABNORMAL HIGH (ref 70–99)

## 2012-04-21 LAB — HEMOGLOBIN AND HEMATOCRIT, BLOOD: Hemoglobin: 9.9 g/dL — ABNORMAL LOW (ref 12.0–15.0)

## 2012-04-21 MED ORDER — OLANZAPINE 2.5 MG PO TABS
2.5000 mg | ORAL_TABLET | Freq: Every day | ORAL | Status: DC
  Administered 2012-04-21 – 2012-04-25 (×4): 2.5 mg via ORAL
  Filled 2012-04-21 (×6): qty 1

## 2012-04-21 MED ORDER — SODIUM CHLORIDE 0.9 % IJ SOLN
INTRAMUSCULAR | Status: AC
  Filled 2012-04-21: qty 10

## 2012-04-21 MED ORDER — FUROSEMIDE 10 MG/ML IJ SOLN
20.0000 mg | Freq: Once | INTRAMUSCULAR | Status: AC
  Administered 2012-04-21: 20 mg via INTRAVENOUS

## 2012-04-21 MED ORDER — FUROSEMIDE 10 MG/ML IJ SOLN
INTRAMUSCULAR | Status: AC
  Filled 2012-04-21: qty 4

## 2012-04-21 MED ORDER — OXYCODONE HCL 5 MG PO TABS
5.0000 mg | ORAL_TABLET | Freq: Four times a day (QID) | ORAL | Status: DC | PRN
  Administered 2012-04-21: 5 mg via ORAL
  Filled 2012-04-21: qty 1

## 2012-04-21 NOTE — Progress Notes (Signed)
Occupational Therapy Treatment Patient Details Name: [name] MRN: [id number] DOB: 30-Jul-1931 Today's Date: 04/21/2012 Time: 1010-1055 OT Time Calculation (min): 45 min  OT Assessment / Plan / Recommendation Comments on Treatment Session Good participation today. Pt OOB in chair using maximove. unable to tolerate pivot or slide transfer at this time.Tolerating L hand splint without any problems noted. L sling used for transfer. discussed transfer tehniique with nsg using lift and maintaining hip precautions and LUE WBS. Pt appears confused at times. ? pain medication. Will continue per POC.    Follow Up Recommendations  SNF    Barriers to Discharge       Equipment Recommendations  None recommended by OT    Recommendations for Other Services    Frequency Min 3X/week   Plan Discharge plan remains appropriate    Precautions / Restrictions Precautions Precautions: Fall;Posterior Hip Precaution Booklet Issued: Yes (comment) Precaution Comments: posterior hip precautions sheet and posterior hip exercises given to pt Required Braces or Orthoses: Other Brace/Splint Other Brace/Splint: LUE sling whne up and thumb spica splint all the time Restrictions Weight Bearing Restrictions: Yes RUE Partial Weight Bearing Percentage or Pounds:  (< 25 #) LUE Weight Bearing: Partial weight bearing LUE Partial Weight Bearing Percentage or Pounds: WB via elbow only <25# LLE Weight Bearing: Touchdown weight bearing   Pertinent Vitals/Pain 6. Premedicated.  End of session: HR 80. O2 SATS 95 3 L. RR 22. BP 98/41    ADL  ADL Comments: focus of session on getting OOB. Initially tried post scoot transfer. Pt unalbe to tolerate, therefor Maximove used with good results. Pt able to sit EOB @ 10-15 min. Initially requiring Max A, but eventuall SBA. Pt R bias due to hip pain. splint checked . no problems noted, howeer, velcro tab need to be replaced.     OT Diagnosis:    OT Problem List:   OT  Treatment Interventions:     OT Goals Acute Rehab OT Goals OT Goal Formulation: With patient Time For Goal Achievement: 04/29/12 Potential to Achieve Goals: Good ADL Goals Pt Will Perform Grooming: with set-up;with supervision;Unsupported;Sitting, edge of bed ADL Goal: Grooming - Progress: Progressing toward goals Pt Will Transfer to Toilet: with min assist;Squat pivot transfer;Stand pivot transfer;3-in-1;Maintaining weight bearing status ADL Goal: Toilet Transfer - Progress: Progressing toward goals Additional ADL Goal #1: Pt will tolerate splint wear per schedule - Lt. thumb spica splint ADL Goal: Additional Goal #1 - Progress: Progressing toward goals Additional ADL Goal #2: Pt/family will be independent with splint wear and care ADL Goal: Additional Goal #2 - Progress: Progressing toward goals Miscellaneous OT Goals Miscellaneous OT Goal #1: Pt will be able to roll to the right with Min A to A with BADLs with HOB flat and use of rail. OT Goal: Miscellaneous Goal #1 - Progress: Progressing toward goals Miscellaneous OT Goal #2: Pt will be able to come up from right side lying to sit with Min A and use of rail with HOB no greater than 20 degrees in prep for transfers. OT Goal: Miscellaneous Goal #2 - Progress: Progressing toward goals Miscellaneous OT Goal #3: Pt will be able to maintain standing >1 minute with min A to A with BADLs. Miscellaneous OT Goal #4: Pt will roll right with S to A with BADLs and in prep for transfers Miscellaneous OT Goal #5: Pt will come up to sit with Supervision in prep for lateral transfers OT Goal: Miscellaneous Goal #5 - Progress: Progressing toward goals  Visit Information  Last OT Received On: 04/21/12 Assistance Needed: +2 PT/OT Co-Evaluation/Treatment: Yes    Subjective Data      Prior Functioning       Cognition  Cognition Overall Cognitive Status: Impaired Area of Impairment: Attention Arousal/Alertness: Awake/alert Orientation Level:  Appears intact for tasks assessed Behavior During Session: Agitated Current Attention Level: Sustained Attention - Other Comments: internally distracted    Mobility  Bed Mobility Bed Mobility: Supine to Sit;Sitting - Scoot to Edge of Bed Supine to Sit: 1: +2 Total assist Supine to Sit: Patient Percentage: 20% Sitting - Scoot to Edge of Bed: 1: +2 Total assist Sitting - Scoot to Edge of Bed: Patient Percentage: 20% Details for Bed Mobility Assistance: Max encouragement. Manual A with use of chuck to slide pt Transfers Transfers: Not assessed (not medically ready) Details for Transfer Assistance: used Maximove from sitting posiiton    Exercises  General Exercises - Upper Extremity Shoulder Flexion: AROM;Right;10 reps Shoulder Extension: AROM;Right;10 reps;Strengthening Shoulder ABduction: AROM;Strengthening;Right;10 reps Shoulder ADduction: AROM;Strengthening;Right;10 reps Elbow Flexion: Left;AAROM;10 reps Elbow Extension: AAROM;Left;10 reps   Balance  MinA static sitting EOB   End of Session OT - End of Session Activity Tolerance: Patient tolerated treatment well Patient left: in chair;with call bell/phone within reach;with family/visitor present Nurse Communication: Other (comment)  GO     Easton Fetty,HILLARY 04/21/2012, 2:10 PM [facility], OTR/L  [phone] 04/21/2012 Sherolyn Trettin, OTR/L  [phone] 04/21/2012
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
## 2012-04-21 NOTE — Progress Notes (Signed)
Orthopaedic Trauma Service (OTS)  Subjective: 2 Days Post-Op Procedure(s) (LRB): OPEN REDUCTION INTERNAL FIXATION (ORIF) ACETABULAR FRACTURE (Left), left prox humerus, left thumb C frxs  Patient reports pain as mild and primarily related to the hip.  Objective: Current Vitals Blood pressure 95/48, pulse 78, temperature 98.3 F (36.8 C), temperature source Oral, resp. rate 21, height 5\' 5"  (1.651 m), weight 175 lb 0.7 oz (79.4 kg), SpO2 93.00%. Vital signs in last 24 hours: Temp:  [98.3 F (36.8 C)-99.2 F (37.3 C)] 98.3 F (36.8 C) (02/13 0808) Pulse Rate:  [70-80] 78 (02/13 1021) Resp:  [18-21] 21 (02/13 0600) BP: (86-101)/(35-59) 95/48 mmHg (02/13 1021) SpO2:  [92 %-98 %] 93 % (02/13 0846)  Intake/Output from previous day: 02/12 0701 - 02/13 0700 In: 1626 [P.O.:720; I.V.:906] Out: 2375 [Urine:2375]  LABS  Recent Labs  04/19/12 0902 04/19/12 1005 04/20/12 0640 04/20/12 1650 04/21/12 0330  HGB 10.1* 9.8* 8.7* 8.2* 8.0*    Recent Labs  04/20/12 0640 04/20/12 1650 04/21/12 0330  WBC 7.4  --  9.6  RBC 2.92*  --  2.67*  HCT 26.1* 24.5* 24.0*  PLT 190  --  194    Recent Labs  04/20/12 0640 04/21/12 0330  NA 136 135  K 4.2 3.8  CL 102 102  CO2 26 26  BUN 20 16  CREATININE 0.93 0.80  GLUCOSE 130* 156*  CALCIUM 8.5 8.2*   No results found for this basename: LABPT, INR,  in the last 72 hours  Physical Exam  Pelvis: L hip incision stable, dressing pristine Left Lower Extremity  DPN, SPN, TN sensation intact  EHL, FHL, AT, PT intact  Ext warm, + DP pulse  Compartments soft and NT Ecchymosis decreasing L knee  Blister stable anterior knee  NT L knee, tibia and ankle  Swelling stable distally   Imaging Dg Hip Operative Left  04/19/2012  *RADIOLOGY REPORT*  Clinical Data: Left acetabular fracture  OPERATIVE LEFT HIP  Comparison: None.  Findings: C-arm films document satisfactory position and alignment status post left acetabular fracture plate and  screw remodeling.  IMPRESSION: As above.   Original Report Authenticated By: Davonna Belling, M.D.    Dg Pelvis Comp Min 3v  04/19/2012  *RADIOLOGY REPORT*  Clinical Data: Acetabular fracture  JUDET PELVIS - 3+ VIEW  Comparison: None.  Findings: Improved position and alignment status post plate and screw reconstruction of a left acetabular fracture. Right iliac stent grafts.  IMPRESSION: As above.   Original Report Authenticated By: Davonna Belling, M.D.     Assessment/Plan: 2 Days Post-Op Procedure(s) (LRB): OPEN REDUCTION INTERNAL FIXATION (ORIF) ACETABULAR FRACTURE (Left) DM  HYPERLIPIDEMIA  OBSTRUCTIVE SLEEP APNEA  CORONARY ARTERY DISEASE  Atrial fibrillation  INVASIVE DUCTAL CARCINOMA, LEFT BREAST  PACEMAKER, PERMANENT  Gold stage C. COPD with frequent exacerbations Fracture of humerus, proximal, left, closed  First metacarpal bone fracture  Acute blood loss anemia  Unspecified vitamin D deficiency  Decreased bone density   PLAN: 1. Appreciate uPRBC for acute blodd loss anemia as that should benefit her significantly  2. TDWB with post hip prec on the LLE 3. Partial WB (<25lbs) thru elbow for xfers and around 4 wks will consider use of platform, but no pulling with L arm and no active motion 4. Change dressing in another 3-4 days as long as remains pristine 5. Appreciate primary service effort to minimize narcs as well  I will be out of town through Monday but may be reached by cell at [phone] and  my assistant, Montez Morita, [facility], will be rounding tomorrow with MD coverage available on call as needed also at all times  Myrene Galas, MD [facility] [phone] (p)  04/21/2012, 12:35 PM

## 2012-04-21 NOTE — Progress Notes (Signed)
ANTICOAGULATION CONSULT NOTE - Follow Up Consult  Pharmacy Consult:  Lovenox Indication:  Atrial fibrillation  Allergies  Allergen Reactions  . Cholestatin     RAGWEED SEASON    Patient Measurements: Height: 5\' 5"  (165.1 cm) Weight: 175 lb 0.7 oz (79.4 kg) IBW/kg (Calculated) : 57  Vital Signs: Temp: 98.9 F (37.2 C) (02/13 0400) Temp src: Oral (02/13 0400) BP: 86/55 mmHg (02/13 0400) Pulse Rate: 77 (02/13 0600)  Labs:  Recent Labs  04/19/12 1005 04/20/12 0640 04/20/12 1650 04/21/12 0330  HGB 9.8* 8.7* 8.2* 8.0*  HCT 30.0* 26.1* 24.5* 24.0*  PLT 192 190  --  194  CREATININE 0.86 0.93  --  0.80    Estimated Creatinine Clearance: 60.4 ml/min (by C-G formula based on Cr of 0.8).     Assessment: 78 YOF with history of Afib on apixaban PTA.  Now s/p ORIF acetabular fracture, POD#2, and continued on Lovenox.  Patient's renal function has been stable.  No overt bleeding reported; hemoglobin is low but stable; platelets are WNL.   Goal of Therapy:  Anti-Xa level 0.6-1.2 units/ml 4hrs after LMWH dose given Monitor platelets by anticoagulation protocol: Yes    Plan:  - Continue Lovenox 80mg  SQ Q12H - CBC Q72H while on Lovenox - F/U resume home anticoagulation    Georgia Baria D. Laney Potash, PharmD, BCPS Pager:  [phone] 04/21/2012, 8:14 AM
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
## 2012-04-21 NOTE — Progress Notes (Signed)
Subjective: By report [name] had severe sundowning with paranoia and disorientation last night. No reports of any medical problems. Family reports that she had been resistant to therapy and that she needs assistance with eating.   She is lethargic and drops off to sleep. She reports that she did not sleep. She reports she has not had meals. No BM.   Objective: Lab: Lab Results  Component Value Date   WBC 9.6 04/21/2012   HGB 8.0* 04/21/2012   HCT 24.0* 04/21/2012   MCV 89.9 04/21/2012   PLT 194 04/21/2012   BMET    Component Value Date/Time   NA 135 04/21/2012 0330   K 3.8 04/21/2012 0330   CL 102 04/21/2012 0330   CO2 26 04/21/2012 0330   GLUCOSE 156* 04/21/2012 0330   BUN 16 04/21/2012 0330   BUN 18 07/14/2010 0922   CREATININE 0.80 04/21/2012 0330   CREATININE 1.0 07/14/2010 0922   CALCIUM 8.2* 04/21/2012 0330   GFRNONAA 69* 04/21/2012 0330   GFRAA 80* 04/21/2012 0330     Imaging:  Scheduled Meds: . antiseptic oral rinse  15 mL Mouth Rinse BID  . atenolol  25 mg Oral Daily  . atorvastatin  20 mg Oral q1800  . cholecalciferol  1,000 Units Oral Daily  . cilostazol  100 mg Oral QHS  . enoxaparin (LOVENOX) injection  80 mg Subcutaneous Q12H  . feeding supplement  237 mL Oral TID BM  . ferrous sulfate  325 mg Oral Q breakfast  . fluticasone  2 spray Each Nare QHS  . furosemide  80 mg Oral Daily  . guaiFENesin  1,200 mg Oral BID  . letrozole  2.5 mg Oral Daily  . magnesium hydroxide  30 mL Oral QHS  . magnesium oxide  200 mg Oral Daily  . metFORMIN  500 mg Oral Q breakfast  . mometasone-formoterol  2 puff Inhalation BID  . niacin  500 mg Oral BID WC  . pantoprazole  40 mg Oral Daily  . potassium chloride SA  40 mEq Oral QHS  . sodium chloride  3 mL Intravenous Q12H  . tiotropium  18 mcg Inhalation Daily  . traMADol  50 mg Oral Q8H  . vitamin C  250 mg Oral Q M,W,F  . vitamin E  100 Units Oral 2 times weekly   Continuous Infusions: . lactated ringers 100 mL/hr at 04/20/12  1800   PRN Meds:.ALPRAZolam, alum & mag hydroxide-simeth, levalbuterol, methocarbamol, morphine injection, ondansetron (ZOFRAN) IV, ondansetron, oxyCODONE, polyethylene glycol   Physical Exam: Filed Vitals:   04/21/12 1021  BP: 95/48 - 86/55  Pulse: 78  Temp: 98.7  Resp:     Intake/Output Summary (Last 24 hours) at 04/21/12 1118 Last data filed at 04/21/12 1610  Gross per 24 hour  Intake   1620 ml  Output   3425 ml  Net  -1805 ml  Total this Adm - -7 liters No weights Gen'l- disheveled white woman in no distress HEENT - C&S clear Cor - heart sounds are distant but sound regular PUlm - no increased WOB, auscultation is difficult due to equipment Abd - BS+, soft Ext - in left arm sling, legs in SCDs Neuro - oriented to examiner and place, lethargic Derm - hematoma left leg at knee, bruising.      Assessment/Plan: 1. Ortho - POD#2 - acetabular repair. With Saltese lift she was place in a chair. She continues to c/o pain in the hip and the arm. PT/OT are on board.  Plan Continue present therapy  Will minimize narcotics  2. PUlm - stable. Plan- continue present treatments  4. Cardiac - stable rhythm. BP  Is OK. IO very negative.  5. DM - stable  6. GU - still has Foley cath.  7. Delerium - continues to be a problem at night.  Plan Will order night time sitter  Minimize narcotics  Zyprexa 2.5 mg qhs  8. Anemia - cardiology and pulmonary recommend transfusion   [name] IM (o) [phone]; (c) [phone] Call-grp - [name] IM  Tele: [phone]  04/21/2012, 10:51 AM
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
## 2012-04-21 NOTE — Progress Notes (Signed)
I have seen and examined the patient. I agree with the findings above.  Budd Palmer, MD 04/21/2012 12:51 PM
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
## 2012-04-22 DIAGNOSIS — Z9889 Other specified postprocedural states: Secondary | ICD-10-CM

## 2012-04-22 LAB — CBC
HCT: 24.4 % — ABNORMAL LOW (ref 36.0–46.0)
MCV: 89.4 fL (ref 78.0–100.0)
Platelets: 213 10*3/uL (ref 150–400)
RBC: 2.73 MIL/uL — ABNORMAL LOW (ref 3.87–5.11)
WBC: 9.2 10*3/uL (ref 4.0–10.5)

## 2012-04-22 LAB — GLUCOSE, CAPILLARY
Glucose-Capillary: 140 mg/dL — ABNORMAL HIGH (ref 70–99)
Glucose-Capillary: 150 mg/dL — ABNORMAL HIGH (ref 70–99)

## 2012-04-22 LAB — HEMOGLOBIN AND HEMATOCRIT, BLOOD: HCT: 28.9 % — ABNORMAL LOW (ref 36.0–46.0)

## 2012-04-22 LAB — TYPE AND SCREEN: Unit division: 0

## 2012-04-22 LAB — BASIC METABOLIC PANEL
BUN: 11 mg/dL (ref 6–23)
CO2: 26 mEq/L (ref 19–32)
Chloride: 103 mEq/L (ref 96–112)
Creatinine, Ser: 0.69 mg/dL (ref 0.50–1.10)
Potassium: 3.5 mEq/L (ref 3.5–5.1)

## 2012-04-22 LAB — PTH, INTACT AND CALCIUM: PTH: 101.2 pg/mL — ABNORMAL HIGH (ref 14.0–72.0)

## 2012-04-22 MED ORDER — ACETAMINOPHEN 325 MG PO TABS: 325.0000 mg | ORAL_TABLET | Freq: Four times a day (QID) | ORAL | Status: DC | PRN
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
## 2012-04-22 NOTE — Progress Notes (Signed)
Pt appeared (and family confirmed) drowsy. She aroused enough to ask who I was and thanked me for the visit. Daughter and husband were bedside and thankful for visit. Marjory Lies Chaplain
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
## 2012-04-22 NOTE — Progress Notes (Signed)
Pt RN assign changed, pt and family verbalized understanding for change, pt VSS, pt tol change well

## 2012-04-22 NOTE — Progress Notes (Signed)
Occupational Therapy Treatment Patient Details Name: [name] MRN: [id number] DOB: [birth year] Today's Date: 04/22/2012 Time: 5621-3086 OT Time Calculation (min): 8 min  OT Assessment / Plan / Recommendation Comments on Treatment Session Splint in excellent position and well maintained by patient/ staff following schedule posted at [facility]. OT to continue to follow patient for splint care and mobility. Family requesting patient OOB daily. RN staff notified of  family request.    Follow Up Recommendations  SNF    Barriers to Discharge       Equipment Recommendations       Recommendations for Other Services    Frequency Min 3X/week (splint monitoring)  Plan Discharge plan remains appropriate    Precautions / Restrictions Precautions Precautions: Fall;Posterior Hip Precaution Comments: posterior hip precautions  Required Braces or Orthoses: Other Brace/Splint Other Brace/Splint: LUE sling when up and thumb spica splint all the time Restrictions Weight Bearing Restrictions: Yes LUE Weight Bearing: Partial weight bearing LUE Partial Weight Bearing Percentage or Pounds: Partial WB (<25lbs) thru elbow for xfers and around 4 wks will consider use of platform, but no pulling with L arm and no active motion (per Handy note 04/21/12) LLE Weight Bearing: Touchdown weight bearing   Pertinent Vitals/Pain No pain reported Pt premedicated and tolerating activity well Family present    ADL  ADL Comments: The focus of session was on splint check. Pt tolerating splint well and following schedule. Rn is checking splint x2 per day. The stockette is well fitted and don correctly on arrival. Pt with no edema noted and decr in size for blisters. The volar surface of forearm blister is completely flat and closed. The dorsal forearm blister remains with minimal amount of fluid and closed. Pt demonstrates AAROM digits (1-4 digits no thumb movement), elbow flexion/ extension, scapula elevation, shoulder  flexion during session x10 reps. Pt positioned with pillow and in sling for proper edema management. Family present in room and asking about therapy daily. Family educated that therapy is following patient and will not see her daily but will attempt between the disciplines to see her at least 5 days per week. The RN staff are to get patient OOB daily. RN notified of patient's family request for daily OOB. Family grateful for education on splint and LT UE update. OT to check splint every other day due to good positioning, good recall, RN staff following schedule posted at [facility] and decr edema at this time. Next session OT to provide handout for HEP for LT UE per patient request with pictures on motions that are allowed LT UE.   OT Diagnosis:    OT Problem List:   OT Treatment Interventions:     OT Goals Acute Rehab OT Goals OT Goal Formulation: With patient Time For Goal Achievement: 04/29/12 Potential to Achieve Goals: Good ADL Goals Pt Will Perform Grooming: with set-up;with supervision;Unsupported;Sitting, edge of bed Pt Will Transfer to Toilet: with min assist;Squat pivot transfer;Stand pivot transfer;3-in-1;Maintaining weight bearing status Additional ADL Goal #1: Pt will tolerate splint wear per schedule - Lt. thumb spica splint ADL Goal: Additional Goal #1 - Progress: Met Additional ADL Goal #2: Pt/family will be independent with splint wear and care ADL Goal: Additional Goal #2 - Progress: Progressing toward goals Miscellaneous OT Goals Miscellaneous OT Goal #1: Pt will be able to roll to the right with Min A to A with BADLs with HOB flat and use of rail. Miscellaneous OT Goal #2: Pt will be able to come up from right side  lying to sit with Min A and use of rail with HOB no greater than 20 degrees in prep for transfers. Miscellaneous OT Goal #3: Pt will be able to maintain standing >1 minute with min A to A with BADLs. Miscellaneous OT Goal #4: Pt will roll right with S to A with BADLs and  in prep for transfers Miscellaneous OT Goal #5: Pt will come up to sit with Supervision in prep for lateral transfers  Visit Information  Last OT Received On: 04/22/12 Assistance Needed: +2    Subjective Data      Prior Functioning       Cognition  Cognition Overall Cognitive Status: Impaired Area of Impairment: Attention;Memory;Problem solving Arousal/Alertness: Awake/alert Orientation Level: Appears intact for tasks assessed Behavior During Session: Flat affect Current Attention Level: Sustained Memory Deficits: pt states "its always stays on"- when provided questioning cues about splint schedule    Mobility       Exercises  General Exercises - Upper Extremity Shoulder Flexion: AAROM;Left;10 reps Elbow Flexion: AAROM;Left;10 reps Elbow Extension: AAROM;Left;10 reps Digit Composite Flexion: AAROM;Left;10 reps Composite Extension: AAROM;10 reps;Left   Balance     End of Session OT - End of Session Activity Tolerance: Patient tolerated treatment well Patient left: in bed;with call bell/phone within reach Nurse Communication: Mobility status;Precautions  GO     Lucile Shutters 04/22/2012, 12:07 PM Pager: [phone]

## 2012-04-22 NOTE — Progress Notes (Signed)
PT put herself on her home cpap. With 2l of O2 connected. TR will continue to [employer]

## 2012-04-22 NOTE — Progress Notes (Signed)
Pt observered to confused to date, pt re-oriented

## 2012-04-23 ENCOUNTER — Inpatient Hospital Stay (HOSPITAL_COMMUNITY): Payer: Medicare Other

## 2012-04-23 LAB — CBC
Hemoglobin: 8.1 g/dL — ABNORMAL LOW (ref 12.0–15.0)
Platelets: 240 10*3/uL (ref 150–400)
RBC: 2.71 MIL/uL — ABNORMAL LOW (ref 3.87–5.11)
WBC: 8.6 10*3/uL (ref 4.0–10.5)

## 2012-04-23 LAB — VITAMIN B12: Vitamin B-12: 495 pg/mL (ref 211–911)

## 2012-04-23 LAB — RETICULOCYTES
RBC.: 3.43 MIL/uL — ABNORMAL LOW (ref 3.87–5.11)
Retic Ct Pct: 5.1 % — ABNORMAL HIGH (ref 0.4–3.1)

## 2012-04-23 LAB — GLUCOSE, CAPILLARY
Glucose-Capillary: 138 mg/dL — ABNORMAL HIGH (ref 70–99)
Glucose-Capillary: 154 mg/dL — ABNORMAL HIGH (ref 70–99)
Glucose-Capillary: 93 mg/dL (ref 70–99)

## 2012-04-23 LAB — BASIC METABOLIC PANEL
BUN: 10 mg/dL (ref 6–23)
Calcium: 8 mg/dL — ABNORMAL LOW (ref 8.4–10.5)
Creatinine, Ser: 0.64 mg/dL (ref 0.50–1.10)
GFR calc Af Amer: >90 mL/min (ref >90)
GFR calc non Af Amer: 83 mL/min — ABNORMAL LOW (ref >90)

## 2012-04-23 LAB — IRON AND TIBC: Saturation Ratios: 12 % — ABNORMAL LOW (ref 20–55)

## 2012-04-23 LAB — CALCIUM, IONIZED: Calcium, Ion: 1.22 mmol/L (ref 1.13–1.30)

## 2012-04-23 LAB — FERRITIN: Ferritin: 359 ng/mL — ABNORMAL HIGH (ref 10–291)

## 2012-04-23 MED ORDER — SODIUM CHLORIDE 0.45 % IV SOLN
INTRAVENOUS | Status: DC
  Administered 2012-04-23 – 2012-04-24 (×2): via INTRAVENOUS
  Administered 2012-04-25: 1000 mL via INTRAVENOUS
  Administered 2012-04-26: 18:00:00 via INTRAVENOUS

## 2012-04-23 NOTE — Progress Notes (Signed)
Report called, Pt transferred to 5N31 via bed with belongings, notified family of new room number. [name]

## 2012-04-23 NOTE — Progress Notes (Signed)
Called report on KUB: appearance of ileus. Pt not demonstrating signs of obstruction: no guarding per RN; no N/V.  Plan D/c narcotics  IVF w/ 1/2 NS at 50 cc/hr  Clear liquid diet  F/u portable KUB in AM
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
## 2012-04-23 NOTE — Progress Notes (Signed)
Subjective: Awake and alert. C/o loose stool. By RN report she has had minimal watery stool. RN report - no problems over night with delusions or agitation  Objective: Lab: Lab Results  Component Value Date   WBC 8.6 04/23/2012   HGB 8.1* 04/23/2012   HCT 24.5* 04/23/2012   MCV 90.4 04/23/2012   PLT 240 04/23/2012   BMET    Component Value Date/Time   NA 137 04/23/2012 0500   K 3.5 04/23/2012 0500   CL 103 04/23/2012 0500   CO2 23 04/23/2012 0500   GLUCOSE 126* 04/23/2012 0500   BUN 10 04/23/2012 0500   BUN 18 07/14/2010 0922   CREATININE 0.64 04/23/2012 0500   CREATININE 1.0 07/14/2010 0922   CALCIUM 8.0* 04/23/2012 0500   CALCIUM 7.7* 04/21/2012 0330   GFRNONAA 83* 04/23/2012 0500   GFRAA >90 04/23/2012 0500   PTH 101.2  TSH 1.38 vit D 23   Imaging:  Scheduled Meds: . antiseptic oral rinse  15 mL Mouth Rinse BID  . atenolol  25 mg Oral Daily  . atorvastatin  20 mg Oral q1800  . cholecalciferol  1,000 Units Oral Daily  . cilostazol  100 mg Oral QHS  . enoxaparin (LOVENOX) injection  80 mg Subcutaneous Q12H  . feeding supplement  237 mL Oral TID BM  . ferrous sulfate  325 mg Oral Q breakfast  . fluticasone  2 spray Each Nare QHS  . furosemide  80 mg Oral Daily  . guaiFENesin  1,200 mg Oral BID  . letrozole  2.5 mg Oral Daily  . magnesium hydroxide  30 mL Oral QHS  . magnesium oxide  200 mg Oral Daily  . metFORMIN  500 mg Oral Q breakfast  . mometasone-formoterol  2 puff Inhalation BID  . niacin  500 mg Oral BID WC  . OLANZapine  2.5 mg Oral QHS  . pantoprazole  40 mg Oral Daily  . potassium chloride SA  40 mEq Oral QHS  . sodium chloride  3 mL Intravenous Q12H  . tiotropium  18 mcg Inhalation Daily  . traMADol  50 mg Oral Q8H  . vitamin C  250 mg Oral Q M,W,F  . vitamin E  100 Units Oral 2 times weekly   Continuous Infusions: . lactated ringers 100 mL/hr at 04/23/12 0213   PRN Meds:.acetaminophen, ALPRAZolam, alum & mag hydroxide-simeth, levalbuterol, methocarbamol,  ondansetron (ZOFRAN) IV, ondansetron, oxyCODONE, polyethylene glycol   Physical Exam: Filed Vitals:   04/23/12 0700  BP:   Pulse:   Temp: 98.2 F (36.8 C)  Resp:     Intake/Output Summary (Last 24 hours) at 04/23/12 0839 Last data filed at 04/23/12 0700  Gross per 24 hour  Intake   2540 ml  Output   2400 ml  Net    140 ml   Gen'l- Elderly white woman in no distress HEENT C&S clear Cor- 2+ radial, RRR Pulm - normal respirations Abd - protuberant, BS+, no guarding or rebound, seems full Ext - no change in exam Neuro - A&O x 3.      Assessment/Plan: 1. Ortho - POD #4  Seems stable Plan Move to ortho floor 5N  2. Pulm - stable. No increased WOB, no wheezing  4. Cardiac - hemodynamically stable, holding sinus rhythm  5. DM/Endocrine   CBG (last 3)   Recent Labs  04/22/12 1252 04/22/12 1707 04/22/12 2131  GLUCAP 173* 140* 150*   PTH elevated  Plan - iPTH, ionized serum calcium  6. GU -  will d/c foley  7. Delerium - seems improved Plan- will continue present medications  8. Anemia - holding steady at 8.1 g.(8.4 g 2/14)   [facility] [employer] [facility] (o) [phone]; (c) [phone] Call-grp - [facility] IM  Tele: [phone]  04/23/2012, 8:33 AM

## 2012-04-23 NOTE — Progress Notes (Signed)
Patient O2 sat 86 on room air. Pt placed on 2L nasal cannula. O2 sat now 97.

## 2012-04-24 ENCOUNTER — Inpatient Hospital Stay (HOSPITAL_COMMUNITY): Payer: Medicare Other

## 2012-04-24 DIAGNOSIS — R404 Transient alteration of awareness: Secondary | ICD-10-CM

## 2012-04-24 LAB — GLUCOSE, CAPILLARY: Glucose-Capillary: 71 mg/dL (ref 70–99)

## 2012-04-24 LAB — BASIC METABOLIC PANEL
BUN: 10 mg/dL (ref 6–23)
Chloride: 101 mEq/L (ref 96–112)
GFR calc non Af Amer: 79 mL/min — ABNORMAL LOW (ref >90)
Glucose, Bld: 94 mg/dL (ref 70–99)
Potassium: 3.1 mEq/L — ABNORMAL LOW (ref 3.5–5.1)

## 2012-04-24 LAB — URINALYSIS, ROUTINE W REFLEX MICROSCOPIC
Nitrite: NEGATIVE
Specific Gravity, Urine: 1.014 (ref 1.005–1.030)
Urobilinogen, UA: 1 mg/dL (ref 0.0–1.0)

## 2012-04-24 LAB — URINE MICROSCOPIC-ADD ON

## 2012-04-24 MED ORDER — SULFAMETHOXAZOLE-TMP DS 800-160 MG PO TABS
1.0000 | ORAL_TABLET | Freq: Two times a day (BID) | ORAL | Status: DC
  Administered 2012-04-24 – 2012-04-27 (×6): 1 via ORAL
  Filled 2012-04-24 (×8): qty 1

## 2012-04-24 MED ORDER — TECHNETIUM TO 99M ALBUMIN AGGREGATED
3.0000 | Freq: Once | INTRAVENOUS | Status: AC | PRN
  Administered 2012-04-24: 3 via INTRAVENOUS

## 2012-04-24 MED ORDER — HALOPERIDOL 2 MG PO TABS
2.0000 mg | ORAL_TABLET | Freq: Four times a day (QID) | ORAL | Status: DC | PRN
  Filled 2012-04-24: qty 1

## 2012-04-24 MED ORDER — HALOPERIDOL LACTATE 5 MG/ML IJ SOLN
2.0000 mg | Freq: Four times a day (QID) | INTRAMUSCULAR | Status: DC | PRN
  Administered 2012-04-24: 2 mg via INTRAMUSCULAR
  Filled 2012-04-24: qty 1

## 2012-04-24 NOTE — Progress Notes (Signed)
Subjective: Report of marked increase in agitation with paranoid delusions this AM - patient refusing care and denying family.  [name] is calm and reasonable at interview (1 hr post haldol). She does go on a little about some delusions. She is verbally cooperative.  Objective: Lab: Lab Results  Component Value Date   WBC 8.6 04/23/2012   HGB 9.3* 04/24/2012   HCT 27.4* 04/24/2012   MCV 90.4 04/23/2012   PLT 240 04/23/2012   BMET    Component Value Date/Time   NA 137 04/24/2012 0430   K 3.1* 04/24/2012 0430   CL 101 04/24/2012 0430   CO2 24 04/24/2012 0430   GLUCOSE 94 04/24/2012 0430   BUN 10 04/24/2012 0430   BUN 18 07/14/2010 0922   CREATININE 0.76 04/24/2012 0430   CREATININE 1.0 07/14/2010 0922   CALCIUM 8.3* 04/24/2012 0430   CALCIUM 7.7* 04/21/2012 0330   GFRNONAA 79* 04/24/2012 0430   GFRAA >90 04/24/2012 0430     Imaging: Abd 1 view: IMPRESSION:  No evidence of bowel obstruction or free air. Improving and  improving ileus pattern.  Scheduled Meds: . antiseptic oral rinse  15 mL Mouth Rinse BID  . atenolol  25 mg Oral Daily  . atorvastatin  20 mg Oral q1800  . cholecalciferol  1,000 Units Oral Daily  . cilostazol  100 mg Oral QHS  . enoxaparin (LOVENOX) injection  80 mg Subcutaneous Q12H  . feeding supplement  237 mL Oral TID BM  . ferrous sulfate  325 mg Oral Q breakfast  . fluticasone  2 spray Each Nare QHS  . furosemide  80 mg Oral Daily  . guaiFENesin  1,200 mg Oral BID  . letrozole  2.5 mg Oral Daily  . magnesium oxide  200 mg Oral Daily  . metFORMIN  500 mg Oral Q breakfast  . mometasone-formoterol  2 puff Inhalation BID  . niacin  500 mg Oral BID WC  . OLANZapine  2.5 mg Oral QHS  . pantoprazole  40 mg Oral Daily  . potassium chloride SA  40 mEq Oral QHS  . sodium chloride  3 mL Intravenous Q12H  . tiotropium  18 mcg Inhalation Daily  . traMADol  50 mg Oral Q8H  . vitamin C  250 mg Oral Q M,W,F  . vitamin E  100 Units Oral 2 times weekly   Continuous  Infusions: . sodium chloride 50 mL/hr at 04/23/12 1717   PRN Meds:.acetaminophen, ALPRAZolam, alum & mag hydroxide-simeth, haloperidol, haloperidol lactate, levalbuterol, methocarbamol, ondansetron (ZOFRAN) IV, ondansetron, polyethylene glycol   Physical Exam: Filed Vitals:   04/24/12 0555  BP: 105/43  Pulse: 95  Temp: 98.1 F (36.7 C)  Resp: 18   Gen'l - elderly white woman in no distress HEENT- C&S clear Cor- 2+ radial, RRR Pulm - normal respirations, no wheezing. Pulse Ox at bedside on RA 86% Neuro - awake, oriented to place and examine. Speech is clear.      Assessment/Plan: 1. Ortho  POD#5 stable  2. PUlm - O2 sat on Rm air is low @ 86%. This in conjunction with increased confusion following trauma and surgery raises concern for PE. Plan CT angio  3. Hypersomnolence - no change  4. Cardiac - stable  5. DM/endocrine - normal ionized calcium with elevated PTH - and review reveals history of normal calcium levels, thus hyperparathyroid very unlikely   6. GU - with agitation will check for UTI Plan I&O cath for urinalysis  7. Delerium - significant  episode of delerium this AM. Plan R/o UTI, r/o PE  Haldol 2 mg q 6  Psychiatric consult requested.  8. Anemia - Hgb is improved, >9  9. Ileus -  KUB yesterday with ileus. F/u KUB today with persistent but improved ileus. Narcotics have been stopped. Plan continue clear liquids  KUB in AM     [employer] IM (o) [phone]; (c) [phone] Call-grp - Patsi [facility] IM  Tele: [phone]  04/24/2012, 12:13 PM

## 2012-04-24 NOTE — Progress Notes (Signed)
PM note: 1. V/Q scan reviewed - normal 2. U/A - positive Plan - septra DS bid
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
## 2012-04-24 NOTE — Progress Notes (Signed)
Occupational Therapy Treatment Patient Details Name: [name] MRN: [id number] DOB: [birth year] Today's Date: 04/24/2012 Time: 4098-1191 OT Time Calculation (min): 30 min  OT Assessment / Plan / Recommendation Comments on Treatment Session Pt making slow progress. Encouraging pt to complete exercises in bed. somewhat self limiting. taught daughter exercises to complete with her mother. posted guidelines in pt's room to help decrease anxiety and assist staff with needs. will continue to follow..     Follow Up Recommendations  SNF    Barriers to Discharge       Equipment Recommendations  None recommended by OT    Recommendations for Other Services    Frequency Min 3X/week   Plan Discharge plan remains appropriate    Precautions / Restrictions Precautions Precautions: Fall;Posterior Hip (humerus fracture) Precaution Comments: posterior hip precautions  Required Braces or Orthoses: Other Brace/Splint Other Brace/Splint: LUE sling whne up and thumb spica splint all the time Restrictions RUE Weight Bearing: Weight bearing as tolerated LUE Weight Bearing: Partial weight bearing LUE Partial Weight Bearing Percentage or Pounds: Partial WB (<25lbs) thru elbow for xfers and around 4 wks will consider use of platform, but no pulling with L arm and no active motion (per Handy note 04/21/12) LLE Weight Bearing: Touchdown weight bearing   Pertinent Vitals/Pain 5. "all over"    ADL  ADL Comments: focus of session on general strengthening as tolerated at bed level. Educated daughter on bed exercises and began theraband ex with pt. repaired velcro tab on splint. replaced stockinette. No problems noted. elevated LUE and applied ice. LUE edematous.     OT Diagnosis:    OT Problem List:   OT Treatment Interventions:     OT Goals Acute Rehab OT Goals OT Goal Formulation: With patient Time For Goal Achievement: 04/29/12 Potential to Achieve Goals: Good ADL Goals Pt Will Perform  Grooming: with set-up;with supervision;Unsupported;Sitting, edge of bed ADL Goal: Grooming - Progress: Progressing toward goals Pt Will Transfer to Toilet: with min assist;Squat pivot transfer;Stand pivot transfer;3-in-1;Maintaining weight bearing status Additional ADL Goal #1: Pt will tolerate splint wear per schedule - Lt. thumb spica splint ADL Goal: Additional Goal #1 - Progress: Progressing toward goals Additional ADL Goal #2: Pt/family will be independent with splint wear and care ADL Goal: Additional Goal #2 - Progress: Progressing toward goals Miscellaneous OT Goals Miscellaneous OT Goal #1: Pt will be able to roll to the right with Min A to A with BADLs with HOB flat and use of rail. OT Goal: Miscellaneous Goal #1 - Progress: Progressing toward goals Miscellaneous OT Goal #2: Pt will be able to come up from right side lying to sit with Min A and use of rail with HOB no greater than 20 degrees in prep for transfers. Miscellaneous OT Goal #3: Pt will be able to maintain standing >1 minute with min A to A with BADLs. Miscellaneous OT Goal #4: Pt will roll right with S to A with BADLs and in prep for transfers  Visit Information  Last OT Received On: 04/24/12 Assistance Needed: +2    Subjective Data      Prior Functioning       Cognition  Cognition Overall Cognitive Status: Impaired Area of Impairment: Attention;Memory;Problem solving Arousal/Alertness: Awake/alert Orientation Level: Appears intact for tasks assessed Behavior During Session: Flat affect Current Attention Level: Sustained Attention - Other Comments: internally distracted    Mobility  Bed Mobility Details for Bed Mobility Assistance: Rec trapeze bar to assist with bed mobility  Exercises  Total Joint Exercises Ankle Circles/Pumps: Both;15 reps;Supine General Exercises - Upper Extremity Shoulder Flexion: AROM;Right;10 reps;Strengthening;Theraband Theraband Level (Shoulder Flexion): Level 2  (Red) Shoulder ADduction: AROM;Strengthening;Right;10 reps;Theraband Theraband Level (Shoulder Adduction): Level 2 (Red) Elbow Flexion: Strengthening;Right;10 reps;Theraband Theraband Level (Elbow Flexion): Level 2 (Red) Elbow Extension: Strengthening;Right;10 reps;Theraband Theraband Level (Elbow Extension): Level 2 (Red) Hand Exercises Digit Composite Flexion: Left;AROM;Other (comment) (in splint)   Balance     End of Session OT - End of Session Activity Tolerance: Patient limited by fatigue;Other (comment) (somewhat self limiting) Patient left: in bed;with call bell/phone within reach;with family/visitor present Nurse Communication: Mobility status;Precautions;Weight bearing status  GO     Linkin Vizzini,HILLARY 04/24/2012, 2:02 PM [facility], OTR/L  [phone] 04/24/2012

## 2012-04-25 ENCOUNTER — Inpatient Hospital Stay (HOSPITAL_COMMUNITY): Payer: Medicare Other

## 2012-04-25 DIAGNOSIS — F0280 Dementia in other diseases classified elsewhere without behavioral disturbance: Secondary | ICD-10-CM

## 2012-04-25 DIAGNOSIS — N2581 Secondary hyperparathyroidism of renal origin: Secondary | ICD-10-CM | POA: Diagnosis present

## 2012-04-25 DIAGNOSIS — I5033 Acute on chronic diastolic (congestive) heart failure: Secondary | ICD-10-CM

## 2012-04-25 LAB — BASIC METABOLIC PANEL
CO2: 25 mEq/L (ref 19–32)
Chloride: 104 mEq/L (ref 96–112)
Glucose, Bld: 153 mg/dL — ABNORMAL HIGH (ref 70–99)
Sodium: 139 mEq/L (ref 135–145)

## 2012-04-25 MED ORDER — ADULT MULTIVITAMIN W/MINERALS CH
1.0000 | ORAL_TABLET | Freq: Every day | ORAL | Status: DC
  Administered 2012-04-25 – 2012-04-27 (×3): 1 via ORAL
  Filled 2012-04-25 (×4): qty 1

## 2012-04-25 MED ORDER — BISACODYL 10 MG RE SUPP
10.0000 mg | Freq: Every morning | RECTAL | Status: DC
  Administered 2012-04-25: 10 mg via RECTAL
  Filled 2012-04-25: qty 1

## 2012-04-25 MED ORDER — VITAMIN D (ERGOCALCIFEROL) 1.25 MG (50000 UNIT) PO CAPS
50000.0000 [IU] | ORAL_CAPSULE | ORAL | Status: DC
  Administered 2012-04-25: 50000 [IU] via ORAL
  Filled 2012-04-25 (×2): qty 1

## 2012-04-25 MED ORDER — ATENOLOL 25 MG PO TABS
25.0000 mg | ORAL_TABLET | Freq: Every day | ORAL | Status: DC
  Administered 2012-04-26: 25 mg via ORAL
  Filled 2012-04-25 (×2): qty 1

## 2012-04-25 MED ORDER — WHITE PETROLATUM GEL
Status: AC
  Filled 2012-04-25: qty 5

## 2012-04-25 MED ORDER — POTASSIUM CHLORIDE CRYS ER 20 MEQ PO TBCR
40.0000 meq | EXTENDED_RELEASE_TABLET | Freq: Two times a day (BID) | ORAL | Status: DC
Start: 2012-04-25 — End: 2012-04-27
  Administered 2012-04-25 – 2012-04-27 (×5): 40 meq via ORAL
  Filled 2012-04-25 (×5): qty 2
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
## 2012-04-25 NOTE — Progress Notes (Signed)
Subjective: Drowsy but arousable. Seems to understand. Family reports she had minimal confusion last night but did sleep most of the night. No new c/o pain  Objective: Lab: Lab Results  Component Value Date   WBC 8.6 04/23/2012   HGB 9.3* 04/24/2012   HCT 27.4* 04/24/2012   MCV 90.4 04/23/2012   PLT 240 04/23/2012   BMET    Component Value Date/Time   NA 137 04/24/2012 0430   K 3.1* 04/24/2012 0430   CL 101 04/24/2012 0430   CO2 24 04/24/2012 0430   GLUCOSE 94 04/24/2012 0430   BUN 10 04/24/2012 0430   BUN 18 07/14/2010 0922   CREATININE 0.76 04/24/2012 0430   CREATININE 1.0 07/14/2010 0922   CALCIUM 8.3* 04/24/2012 0430   CALCIUM 7.7* 04/21/2012 0330   GFRNONAA 79* 04/24/2012 0430   GFRAA >90 04/24/2012 0430     Imaging: DG abd 2/17 Findings: Mild gaseous distention of small bowel and colon are  seen. There is gas in the rectum. Findings are not significantly  changed from 04/24/2012. Right iliac chain stents. Postoperative  changes in the left hemi pelvis.  IMPRESSION:  Stable ileus.  Scheduled Meds: . antiseptic oral rinse  15 mL Mouth Rinse BID  . atenolol  25 mg Oral Daily  . atorvastatin  20 mg Oral q1800  . cholecalciferol  1,000 Units Oral Daily  . cilostazol  100 mg Oral QHS  . enoxaparin (LOVENOX) injection  80 mg Subcutaneous Q12H  . feeding supplement  237 mL Oral TID BM  . ferrous sulfate  325 mg Oral Q breakfast  . fluticasone  2 spray Each Nare QHS  . furosemide  80 mg Oral Daily  . guaiFENesin  1,200 mg Oral BID  . letrozole  2.5 mg Oral Daily  . magnesium oxide  200 mg Oral Daily  . metFORMIN  500 mg Oral Q breakfast  . mometasone-formoterol  2 puff Inhalation BID  . niacin  500 mg Oral BID WC  . OLANZapine  2.5 mg Oral QHS  . pantoprazole  40 mg Oral Daily  . potassium chloride SA  40 mEq Oral QHS  . sodium chloride  3 mL Intravenous Q12H  . sulfamethoxazole-trimethoprim  1 tablet Oral Q12H  . tiotropium  18 mcg Inhalation Daily  . traMADol  50 mg Oral Q8H   . vitamin C  250 mg Oral Q M,W,F  . vitamin E  100 Units Oral 2 times weekly   Continuous Infusions: . sodium chloride 50 mL/hr at 04/24/12 2116   PRN Meds:.acetaminophen, ALPRAZolam, alum & mag hydroxide-simeth, haloperidol, haloperidol lactate, levalbuterol, methocarbamol, ondansetron (ZOFRAN) IV, ondansetron, polyethylene glycol   Physical Exam: Filed Vitals:   04/25/12 0407  BP: 102/80  Pulse: 77  Temp: 97.7 F (36.5 C)  Resp: 18    Intake/Output Summary (Last 24 hours) at 04/25/12 1610 Last data filed at 04/24/12 1902  Gross per 24 hour  Intake    360 ml  Output      0 ml  Net    360 ml   Gen'l- Elderly white woman very drowsy HEENT- normal Cor- RRR, II/VI systolic murmur Pulm - no increased WOB, no wheezing or rales Abd - Hypoactive BS, no guarding or rebound Neuro - drowsy     Assessment/Plan: 1. Ortho - POD #6. Working with PT/OT  2. Pulm - V/Q negative. No respiratory distress  4. Cardiac - stable  5. DM -  CBG (last 3)   Recent Labs  04/24/12  1108 04/24/12 1616 04/25/12 0712  GLUCAP 71 108* 102*     6. GU - U/A positive. Septra DS started 2/16  7. Delerium - appreciate Dr. Joelene Millin consult. She had a better night  Plan Continue zyprexa qHS and Haldol prn  8. Anemia - no lab today  9. Ileus - no change on x-ray. Non-acute abdomen. Off narcotics  Plan Advance diet to full liquids  GI consult (family wish)  Dispo - SNF delayed until GI issues resolved and patient taking a diet - hopefully soon.    Illene Regulus [facility] (o) [phone]; (c) [phone] Call-grp - [facility]  Tele: [phone]  04/25/2012, 8:13 AM
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
## 2012-04-25 NOTE — Progress Notes (Signed)
MD notified of low BP 94/44 and orders pertaining to her BP medications at this time. MD ordered to give beta-blocker. New orders received for morning dose. Will hold morning dose. No other complaints at this time. Will continue to monitor.
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
## 2012-04-26 LAB — CBC
HCT: 29.2 % — ABNORMAL LOW (ref 36.0–46.0)
Hemoglobin: 9.5 g/dL — ABNORMAL LOW (ref 12.0–15.0)
MCV: 90.4 fL (ref 78.0–100.0)
RBC: 3.23 MIL/uL — ABNORMAL LOW (ref 3.87–5.11)
RDW: 15.8 % — ABNORMAL HIGH (ref 11.5–15.5)
WBC: 11.9 10*3/uL — ABNORMAL HIGH (ref 4.0–10.5)

## 2012-04-26 LAB — GLUCOSE, CAPILLARY
Glucose-Capillary: 147 mg/dL — ABNORMAL HIGH (ref 70–99)
Glucose-Capillary: 90 mg/dL (ref 70–99)
Glucose-Capillary: 133 mg/dL — ABNORMAL HIGH (ref 70–99)

## 2012-04-26 LAB — BASIC METABOLIC PANEL
BUN: 8 mg/dL (ref 6–23)
CO2: 24 mEq/L (ref 19–32)
Chloride: 105 mEq/L (ref 96–112)
Glucose, Bld: 111 mg/dL — ABNORMAL HIGH (ref 70–99)
Potassium: 4 mEq/L (ref 3.5–5.1)
Sodium: 139 mEq/L (ref 135–145)

## 2012-04-26 MED ORDER — BISACODYL 10 MG RE SUPP
10.0000 mg | Freq: Once | RECTAL | Status: AC
  Administered 2012-04-26: 10 mg via RECTAL
  Filled 2012-04-26 (×2): qty 1

## 2012-04-26 NOTE — Progress Notes (Signed)
Subjective: Reviewed consult notes: do not agree with CCM note recommending additional unit of blood.  Appreciate GI note. Noted nurse report of getting order to give BB yesterday with subsequent comment that BB was held.  Patient asleep and easily awakened, voices no complaints. No reports of problems in notes or treatment team notes. She reports no N/v. She has not had a BM. She is concerned about her skin integrity. No report of significant pain.  Objective: Lab: Lab Results  Component Value Date   WBC 11.9* 04/26/2012   HGB 9.5* 04/26/2012   HCT 29.2* 04/26/2012   MCV 90.4 04/26/2012   PLT 364 04/26/2012   BMET    Component Value Date/Time   NA 139 04/26/2012 0440   K 4.0 04/26/2012 0440   CL 105 04/26/2012 0440   CO2 24 04/26/2012 0440   GLUCOSE 111* 04/26/2012 0440   BUN 8 04/26/2012 0440   BUN 18 07/14/2010 0922   CREATININE 0.82 04/26/2012 0440   CREATININE 1.0 07/14/2010 0922   CALCIUM 8.3* 04/26/2012 0440   CALCIUM 7.7* 04/21/2012 0330   GFRNONAA 67* 04/26/2012 0440   GFRAA 77* 04/26/2012 0440     Imaging:  Scheduled Meds: . antiseptic oral rinse  15 mL Mouth Rinse BID  . atenolol  25 mg Oral Daily  . atorvastatin  20 mg Oral q1800  . bisacodyl  10 mg Rectal q morning - 10a  . cilostazol  100 mg Oral QHS  . enoxaparin (LOVENOX) injection  80 mg Subcutaneous Q12H  . feeding supplement  237 mL Oral TID BM  . ferrous sulfate  325 mg Oral Q breakfast  . fluticasone  2 spray Each Nare QHS  . furosemide  80 mg Oral Daily  . guaiFENesin  1,200 mg Oral BID  . letrozole  2.5 mg Oral Daily  . magnesium oxide  200 mg Oral Daily  . metFORMIN  500 mg Oral Q breakfast  . mometasone-formoterol  2 puff Inhalation BID  . multivitamin with minerals  1 tablet Oral Daily  . niacin  500 mg Oral BID WC  . OLANZapine  2.5 mg Oral QHS  . pantoprazole  40 mg Oral Daily  . potassium chloride SA  40 mEq Oral BID  . sodium chloride  3 mL Intravenous Q12H  . sulfamethoxazole-trimethoprim  1 tablet  Oral Q12H  . tiotropium  18 mcg Inhalation Daily  . traMADol  50 mg Oral Q8H  . vitamin C  250 mg Oral Q M,W,F  . Vitamin D (Ergocalciferol)  50,000 Units Oral Q7 days  . vitamin E  100 Units Oral 2 times weekly   Continuous Infusions: . sodium chloride 1,000 mL (04/25/12 1804)   PRN Meds:.acetaminophen, ALPRAZolam, alum & mag hydroxide-simeth, haloperidol, haloperidol lactate, levalbuterol, methocarbamol, ondansetron (ZOFRAN) IV, ondansetron, polyethylene glycol   Physical Exam: Filed Vitals:   04/26/12 0611  BP: 110/47  Pulse: 61  Temp: 98.7 F (37.1 C)  Resp: 16    Intake/Output Summary (Last 24 hours) at 04/26/12 0709 Last data filed at 04/26/12 0610  Gross per 24 hour  Intake   3240 ml  Output      0 ml  Net   3240 ml  total this Adm: -1.6 liters  Note: no output recorded for last 24 hrs.  Gen'l - heavyset white woman in no distress HEENT- C&S clear Cor- 2+ radial, RRR Pulm - normal unlabored respirations, no wheezing, no rales Abd - BS+ in all quadrants, soft, no guarding or  rebound Ext - no change Neuro - awakens easily and orients quickly.       Assessment/Plan: 1. Ortho - POD #7. Continues to work with PT/OT  2. Pulm - stable  3. Hypersomnolence - for outpatient eval and treatment  4. Cardiac - BP a little soft yesterday, ok today but low. I&O not accurate - no signs of fluid overload  Plan- Will continue present medications.  5. DM -  CBG (last 3)   Recent Labs  04/24/12 1616 04/25/12 0712 04/26/12 0707  GLUCAP 108* 102* 117*    Good control. Poor diet intake may play a role  6. GU - #3 Septra DS for UTI  7. Delerium - seems clear this AM, no report of problems  Plan Will d/c zyprexa, leave prn haldol order  8. Anemia - postop. Hgb is up and stable  9. Ileus -  BS+, no N/V. She has not had a BM  Plan Advance to regular diet  Dulcolax suppository.  Dispo - Anticipate transfer to [facility] 2/19     Illene Regulus Sherman  IM (o) [phone]; (c) [phone] Call-grp - Patsi Sears IM  Tele: [phone]  04/26/2012, 7:04 AM

## 2012-04-26 NOTE — Progress Notes (Signed)
Orthopedic Tech Progress Note Patient Details:  [name] 1931/11/03 [id number]  Patient ID: [name], female   DOB: [birth year], 79 y.o.   MRN: [id number]   [name] 04/26/2012, 2:54 PM trapeze bar
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
## 2012-04-26 NOTE — Progress Notes (Signed)
Clinical Social Work Progress Note PSYCHIATRY SERVICE LINE 04/26/2012  Patient:  [name]  Account:  [id number]  Admit Date:  04/14/2012  Clinical Social Worker:  Unk Lightning, LCSW  Date/Time:  04/26/2012 02:30 PM  Review of Patient  Overall Medical Condition:   Per chart review, patient to dc to SNF on 2/19.   Participation Level:  Active  Participation Quality  Appropriate   Other Participation Quality:   Affect  Appropriate   Cognitive  Alert   Reaction to Medications/Concerns:   None reported   Modes of Intervention  Support  Clarification   Summary of Progress/Plan at Discharge   CSW met with patient at bedside. No visitors present. Patient reports she remembers CSW from previous visit.    Patient reports she is feeling better today. Patient laying in bed and watching tv. Patient reports she was confused this morning but feels she is more alert now. Patient states when she woke up she thought she was supposed to go to school. Patient reports she thought about the logic and realized she is out of school. Patient spoke with CSW and explained that dtr returned home. Husband and son have visited her at the hospital today. Patient reports that she is scheduled to go to [facility] tomorrow in order to get 3-7 weeks of rehab. Patient spoke about her disappointment in having to go to SNF but agreed that she needed to regain strength before returning home.    Patient appropriate and engaged throughout assessment. Per NT, patient has been appropriate today with minimal confusion. CSW will staff case with psych MD and will continue to follow.

## 2012-04-26 NOTE — Progress Notes (Signed)
Subjective: Reports solid bowel movement yesterday after dulcolax. Passing lots of flatus.  Objective: Vital signs in last 24 hours: Temp:  [97.2 F (36.2 C)-98.7 F (37.1 C)] 98.7 F (37.1 C) (02/18 0611) Pulse Rate:  [61-82] 61 (02/18 0611) Resp:  [16-20] 16 (02/18 0611) BP: (94-110)/(41-50) 110/47 mmHg (02/18 0611) SpO2:  [92 %-97 %] 95 % (02/18 0611) Weight change:  Last BM Date: 04/25/12  PE: GEN:  NAD ABD:  Mild-to-moderate distention with tympany (much improved from yesterday)  Lab Results: CMP     Component Value Date/Time   NA 139 04/26/2012 0440   K 4.0 04/26/2012 0440   CL 105 04/26/2012 0440   CO2 24 04/26/2012 0440   GLUCOSE 111* 04/26/2012 0440   BUN 8 04/26/2012 0440   BUN 18 07/14/2010 0922   CREATININE 0.82 04/26/2012 0440   CREATININE 1.0 07/14/2010 0922   CALCIUM 8.3* 04/26/2012 0440   CALCIUM 7.7* 04/21/2012 0330   PROT 6.5 04/19/2012 1005   ALBUMIN 2.9* 04/19/2012 1005   AST 20 04/19/2012 1005   ALT 10 04/19/2012 1005   ALKPHOS 65 04/19/2012 1005   BILITOT 0.9 04/19/2012 1005   GFRNONAA 67* 04/26/2012 0440   GFRAA 77* 04/26/2012 0440   Assessment:  1.  Ileus, much improved.  Multifactorial (post-operative, relative decreased immobility, hypokalemia, narcotics).  Hypokalemia has been corrected.  She is essentially on no narcotics.  Plan:  1.  Increase in mobility, minimizing narcotics, maintaining eukalemia, will all be helpful. 2.  Dulcolax suppositories prn, to achieve daily stool, would be helpful for the next week or two. 3.  Will sign-off; please call with questions; thank you for the consult.   [name] 04/26/2012, 9:52 AM
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
## 2012-04-27 DIAGNOSIS — I10 Essential (primary) hypertension: Secondary | ICD-10-CM | POA: Diagnosis not present

## 2012-04-27 DIAGNOSIS — E875 Hyperkalemia: Secondary | ICD-10-CM | POA: Diagnosis not present

## 2012-04-27 DIAGNOSIS — N179 Acute kidney failure, unspecified: Secondary | ICD-10-CM | POA: Diagnosis not present

## 2012-04-27 DIAGNOSIS — IMO0001 Reserved for inherently not codable concepts without codable children: Secondary | ICD-10-CM | POA: Diagnosis not present

## 2012-04-27 DIAGNOSIS — N2581 Secondary hyperparathyroidism of renal origin: Secondary | ICD-10-CM

## 2012-04-27 DIAGNOSIS — F411 Generalized anxiety disorder: Secondary | ICD-10-CM | POA: Diagnosis not present

## 2012-04-27 DIAGNOSIS — I2510 Atherosclerotic heart disease of native coronary artery without angina pectoris: Secondary | ICD-10-CM | POA: Diagnosis not present

## 2012-04-27 DIAGNOSIS — Z87891 Personal history of nicotine dependence: Secondary | ICD-10-CM | POA: Diagnosis not present

## 2012-04-27 DIAGNOSIS — Z950 Presence of cardiac pacemaker: Secondary | ICD-10-CM | POA: Diagnosis not present

## 2012-04-27 DIAGNOSIS — D72829 Elevated white blood cell count, unspecified: Secondary | ICD-10-CM | POA: Diagnosis present

## 2012-04-27 DIAGNOSIS — G4730 Sleep apnea, unspecified: Secondary | ICD-10-CM | POA: Diagnosis present

## 2012-04-27 DIAGNOSIS — D649 Anemia, unspecified: Secondary | ICD-10-CM | POA: Diagnosis not present

## 2012-04-27 DIAGNOSIS — S32409A Unspecified fracture of unspecified acetabulum, initial encounter for closed fracture: Principal | ICD-10-CM

## 2012-04-27 DIAGNOSIS — N390 Urinary tract infection, site not specified: Secondary | ICD-10-CM | POA: Diagnosis present

## 2012-04-27 DIAGNOSIS — S42213A Unspecified displaced fracture of surgical neck of unspecified humerus, initial encounter for closed fracture: Secondary | ICD-10-CM | POA: Diagnosis not present

## 2012-04-27 DIAGNOSIS — S42209A Unspecified fracture of upper end of unspecified humerus, initial encounter for closed fracture: Secondary | ICD-10-CM | POA: Diagnosis not present

## 2012-04-27 DIAGNOSIS — D62 Acute posthemorrhagic anemia: Secondary | ICD-10-CM | POA: Diagnosis not present

## 2012-04-27 DIAGNOSIS — J449 Chronic obstructive pulmonary disease, unspecified: Secondary | ICD-10-CM | POA: Diagnosis not present

## 2012-04-27 DIAGNOSIS — K929 Disease of digestive system, unspecified: Secondary | ICD-10-CM

## 2012-04-27 DIAGNOSIS — J479 Bronchiectasis, uncomplicated: Secondary | ICD-10-CM | POA: Diagnosis present

## 2012-04-27 DIAGNOSIS — R269 Unspecified abnormalities of gait and mobility: Secondary | ICD-10-CM | POA: Diagnosis not present

## 2012-04-27 DIAGNOSIS — K560 Paralytic ileus: Secondary | ICD-10-CM

## 2012-04-27 DIAGNOSIS — I4891 Unspecified atrial fibrillation: Secondary | ICD-10-CM | POA: Diagnosis present

## 2012-04-27 DIAGNOSIS — I5032 Chronic diastolic (congestive) heart failure: Secondary | ICD-10-CM | POA: Diagnosis not present

## 2012-04-27 DIAGNOSIS — M948X9 Other specified disorders of cartilage, unspecified sites: Secondary | ICD-10-CM

## 2012-04-27 DIAGNOSIS — Z8673 Personal history of transient ischemic attack (TIA), and cerebral infarction without residual deficits: Secondary | ICD-10-CM | POA: Diagnosis not present

## 2012-04-27 DIAGNOSIS — F17200 Nicotine dependence, unspecified, uncomplicated: Secondary | ICD-10-CM

## 2012-04-27 DIAGNOSIS — Z79899 Other long term (current) drug therapy: Secondary | ICD-10-CM | POA: Diagnosis not present

## 2012-04-27 DIAGNOSIS — E785 Hyperlipidemia, unspecified: Secondary | ICD-10-CM | POA: Diagnosis present

## 2012-04-27 DIAGNOSIS — E119 Type 2 diabetes mellitus without complications: Secondary | ICD-10-CM | POA: Diagnosis present

## 2012-04-27 DIAGNOSIS — S62319A Displaced fracture of base of unspecified metacarpal bone, initial encounter for closed fracture: Secondary | ICD-10-CM | POA: Diagnosis not present

## 2012-04-27 DIAGNOSIS — I739 Peripheral vascular disease, unspecified: Secondary | ICD-10-CM | POA: Diagnosis present

## 2012-04-27 DIAGNOSIS — K219 Gastro-esophageal reflux disease without esophagitis: Secondary | ICD-10-CM | POA: Diagnosis present

## 2012-04-27 DIAGNOSIS — S81009A Unspecified open wound, unspecified knee, initial encounter: Secondary | ICD-10-CM | POA: Diagnosis not present

## 2012-04-27 DIAGNOSIS — S42309A Unspecified fracture of shaft of humerus, unspecified arm, initial encounter for closed fracture: Secondary | ICD-10-CM | POA: Diagnosis not present

## 2012-04-27 DIAGNOSIS — L02419 Cutaneous abscess of limb, unspecified: Secondary | ICD-10-CM | POA: Diagnosis not present

## 2012-04-27 DIAGNOSIS — M6281 Muscle weakness (generalized): Secondary | ICD-10-CM | POA: Diagnosis not present

## 2012-04-27 LAB — BASIC METABOLIC PANEL
CO2: 24 mEq/L (ref 19–32)
Chloride: 104 mEq/L (ref 96–112)
Creatinine, Ser: 0.85 mg/dL (ref 0.50–1.10)
GFR calc Af Amer: 74 mL/min — ABNORMAL LOW (ref >90)
Potassium: 4 mEq/L (ref 3.5–5.1)
Sodium: 136 mEq/L (ref 135–145)

## 2012-04-27 LAB — GLUCOSE, CAPILLARY
Glucose-Capillary: 111 mg/dL — ABNORMAL HIGH (ref 70–99)
Glucose-Capillary: 99 mg/dL (ref 70–99)

## 2012-04-27 MED ORDER — POLYETHYLENE GLYCOL 3350 17 G PO PACK: 17.0000 g | PACK | Freq: Every day | ORAL | Status: DC | PRN

## 2012-04-27 MED ORDER — TRAMADOL HCL 50 MG PO TABS: 50.0000 mg | ORAL_TABLET | Freq: Three times a day (TID) | ORAL | Status: DC

## 2012-04-27 MED ORDER — ALUM & MAG HYDROXIDE-SIMETH 200-200-20 MG/5ML PO SUSP: 30.0000 mL | Freq: Four times a day (QID) | ORAL | Status: DC | PRN

## 2012-04-27 MED ORDER — METHOCARBAMOL 500 MG PO TABS: 500.0000 mg | ORAL_TABLET | Freq: Four times a day (QID) | ORAL | Status: DC | PRN

## 2012-04-27 MED ORDER — ACETAMINOPHEN 325 MG PO TABS: 650.0000 mg | ORAL_TABLET | Freq: Four times a day (QID) | ORAL | Status: DC | PRN

## 2012-04-27 MED ORDER — ALPRAZOLAM 0.5 MG PO TABS: 0.5000 mg | ORAL_TABLET | Freq: Four times a day (QID) | ORAL | Status: DC | PRN

## 2012-04-27 MED ORDER — FERROUS SULFATE 325 (65 FE) MG PO TABS: 325.0000 mg | ORAL_TABLET | Freq: Every day | ORAL | Status: DC

## 2012-04-27 MED ORDER — PANTOPRAZOLE SODIUM 40 MG PO TBEC: 40.0000 mg | DELAYED_RELEASE_TABLET | Freq: Every day | ORAL | Status: DC

## 2012-04-27 MED ORDER — VITAMIN D (ERGOCALCIFEROL) 1.25 MG (50000 UNIT) PO CAPS: 50000.0000 [IU] | ORAL_CAPSULE | ORAL | Status: DC

## 2012-04-27 MED ORDER — SULFAMETHOXAZOLE-TMP DS 800-160 MG PO TABS: 1.0000 | ORAL_TABLET | Freq: Two times a day (BID) | ORAL | Status: DC

## 2012-04-27 NOTE — Progress Notes (Signed)
Subjective: Awake and alert. No distress  Objective: Lab: Lab Results  Component Value Date   WBC 11.9* 04/26/2012   HGB 9.5* 04/26/2012   HCT 29.2* 04/26/2012   MCV 90.4 04/26/2012   PLT 364 04/26/2012   BMET    Component Value Date/Time   NA 136 04/27/2012 0420   K 4.0 04/27/2012 0420   CL 104 04/27/2012 0420   CO2 24 04/27/2012 0420   GLUCOSE 120* 04/27/2012 0420   BUN 10 04/27/2012 0420   BUN 18 07/14/2010 0922   CREATININE 0.85 04/27/2012 0420   CREATININE 1.0 07/14/2010 0922   CALCIUM 8.6 04/27/2012 0420   CALCIUM 7.7* 04/21/2012 0330   GFRNONAA 64* 04/27/2012 0420   GFRAA 74* 04/27/2012 0420     Imaging:  Scheduled Meds: . antiseptic oral rinse  15 mL Mouth Rinse BID  . atenolol  25 mg Oral Daily  . atorvastatin  20 mg Oral q1800  . cilostazol  100 mg Oral QHS  . enoxaparin (LOVENOX) injection  80 mg Subcutaneous Q12H  . ferrous sulfate  325 mg Oral Q breakfast  . fluticasone  2 spray Each Nare QHS  . furosemide  80 mg Oral Daily  . guaiFENesin  1,200 mg Oral BID  . letrozole  2.5 mg Oral Daily  . magnesium oxide  200 mg Oral Daily  . metFORMIN  500 mg Oral Q breakfast  . mometasone-formoterol  2 puff Inhalation BID  . multivitamin with minerals  1 tablet Oral Daily  . niacin  500 mg Oral BID WC  . pantoprazole  40 mg Oral Daily  . potassium chloride SA  40 mEq Oral BID  . sodium chloride  3 mL Intravenous Q12H  . sulfamethoxazole-trimethoprim  1 tablet Oral Q12H  . tiotropium  18 mcg Inhalation Daily  . traMADol  50 mg Oral Q8H  . vitamin C  250 mg Oral Q M,W,F  . Vitamin D (Ergocalciferol)  50,000 Units Oral Q7 days  . vitamin E  100 Units Oral 2 times weekly   Continuous Infusions: . sodium chloride 50 mL/hr at 04/26/12 1823   PRN Meds:.acetaminophen, ALPRAZolam, alum & mag hydroxide-simeth, haloperidol, haloperidol lactate, levalbuterol, methocarbamol, ondansetron (ZOFRAN) IV, ondansetron, polyethylene glycol   Physical Exam: Filed Vitals:   04/27/12 0630   BP: 111/48  Pulse: 69  Temp: 98.3 F (36.8 C)  Resp: 16   See d/c summary     Assessment/Plan:  for transfer to SNF. Dictated  #[id number]   Ill[facility] Reg[facility] (o) [phone]; (c) [phone] Call-grp - [facility]  Tele: [phone]  04/27/2012, 6:43 AM
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
## 2012-04-27 NOTE — Progress Notes (Signed)
Clinical Social Work  Per chart review, patient to dc to SNF today. Psychiatrist has signed off. Psych CSW signing off but available if further needs arise.  [place], [place] [phone]
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
## 2012-04-29 NOTE — Progress Notes (Signed)
I have seen and examined the patient. I agree with the findings above.  Wound looks excellent. Return to office within 10 -14 days.  Budd Palmer, MD 04/29/2012 8:33 AM

## 2012-05-03 ENCOUNTER — Telehealth: Payer: Self-pay | Admitting: *Deleted

## 2012-05-03 NOTE — Telephone Encounter (Signed)
I was finally able to reach someone at [facility]. I was advised pt is at a retirement home and not sure when she will be home. Will make RA aware of this.

## 2012-05-03 NOTE — Telephone Encounter (Signed)
Message copied by Tommie Sams on Tue May 03, 2012  4:00 PM ------      Message from: Cyril Mourning V      Created: Fri Apr 22, 2012 12:06 AM       Pl arrange routine ov- post hosp  ------

## 2012-05-04 ENCOUNTER — Telehealth: Payer: Self-pay | Admitting: *Deleted

## 2012-05-04 DIAGNOSIS — S62319A Displaced fracture of base of unspecified metacarpal bone, initial encounter for closed fracture: Secondary | ICD-10-CM | POA: Diagnosis not present

## 2012-05-04 DIAGNOSIS — S42213A Unspecified displaced fracture of surgical neck of unspecified humerus, initial encounter for closed fracture: Secondary | ICD-10-CM | POA: Diagnosis not present

## 2012-05-04 DIAGNOSIS — S32409A Unspecified fracture of unspecified acetabulum, initial encounter for closed fracture: Secondary | ICD-10-CM | POA: Diagnosis not present

## 2012-05-04 NOTE — Telephone Encounter (Signed)
Pt's husband called stating the pt is complaining that her tongue hurts. Pt is unable to come in to the office. Please advise.

## 2012-05-04 NOTE — Telephone Encounter (Signed)
[facility] [phone]) and spoke with pt's nurse, Kathie Rhodes, and told her that the pt's husband called stating the pt is complaining that her tongue hurts. Nurse was unaware of this. Pt has not said anything to them at the facility. She advised that she would let the nurse practitioner there know of this and check her out.

## 2012-05-04 NOTE — Telephone Encounter (Signed)
Magic mouthwash may help. I do not have care priviledges at the skilled care facility where is getting rehab. The Attending physician for the facility will need to prescribe for her. I hope that she is making a steady recovery.

## 2012-05-07 DIAGNOSIS — I5032 Chronic diastolic (congestive) heart failure: Secondary | ICD-10-CM | POA: Diagnosis not present

## 2012-05-07 DIAGNOSIS — G4730 Sleep apnea, unspecified: Secondary | ICD-10-CM | POA: Diagnosis present

## 2012-05-07 DIAGNOSIS — I2510 Atherosclerotic heart disease of native coronary artery without angina pectoris: Secondary | ICD-10-CM | POA: Diagnosis present

## 2012-05-07 DIAGNOSIS — J449 Chronic obstructive pulmonary disease, unspecified: Secondary | ICD-10-CM | POA: Diagnosis not present

## 2012-05-07 DIAGNOSIS — S91009A Unspecified open wound, unspecified ankle, initial encounter: Secondary | ICD-10-CM | POA: Diagnosis not present

## 2012-05-07 DIAGNOSIS — D649 Anemia, unspecified: Secondary | ICD-10-CM | POA: Diagnosis not present

## 2012-05-07 DIAGNOSIS — R269 Unspecified abnormalities of gait and mobility: Secondary | ICD-10-CM | POA: Diagnosis not present

## 2012-05-07 DIAGNOSIS — Z79899 Other long term (current) drug therapy: Secondary | ICD-10-CM | POA: Diagnosis not present

## 2012-05-07 DIAGNOSIS — E119 Type 2 diabetes mellitus without complications: Secondary | ICD-10-CM | POA: Diagnosis present

## 2012-05-07 DIAGNOSIS — Z950 Presence of cardiac pacemaker: Secondary | ICD-10-CM | POA: Diagnosis not present

## 2012-05-07 DIAGNOSIS — L03119 Cellulitis of unspecified part of limb: Secondary | ICD-10-CM | POA: Diagnosis present

## 2012-05-07 DIAGNOSIS — D72829 Elevated white blood cell count, unspecified: Secondary | ICD-10-CM | POA: Diagnosis present

## 2012-05-07 DIAGNOSIS — N390 Urinary tract infection, site not specified: Secondary | ICD-10-CM | POA: Diagnosis present

## 2012-05-07 DIAGNOSIS — K219 Gastro-esophageal reflux disease without esophagitis: Secondary | ICD-10-CM | POA: Diagnosis present

## 2012-05-07 DIAGNOSIS — I739 Peripheral vascular disease, unspecified: Secondary | ICD-10-CM | POA: Diagnosis present

## 2012-05-07 DIAGNOSIS — I4891 Unspecified atrial fibrillation: Secondary | ICD-10-CM | POA: Diagnosis present

## 2012-05-07 DIAGNOSIS — M6281 Muscle weakness (generalized): Secondary | ICD-10-CM | POA: Diagnosis not present

## 2012-05-07 DIAGNOSIS — Z8673 Personal history of transient ischemic attack (TIA), and cerebral infarction without residual deficits: Secondary | ICD-10-CM | POA: Diagnosis not present

## 2012-05-07 DIAGNOSIS — S81009A Unspecified open wound, unspecified knee, initial encounter: Secondary | ICD-10-CM | POA: Diagnosis not present

## 2012-05-14 ENCOUNTER — Emergency Department (HOSPITAL_COMMUNITY): Payer: Medicare Other

## 2012-05-14 ENCOUNTER — Encounter (HOSPITAL_COMMUNITY): Payer: Self-pay | Admitting: Emergency Medicine

## 2012-05-14 ENCOUNTER — Inpatient Hospital Stay (HOSPITAL_COMMUNITY)
Admission: EM | Admit: 2012-05-14 | Discharge: 2012-05-17 | DRG: 603 | Disposition: A | Discharge status: Skilled Nursing Facility | Payer: Medicare Other | Attending: Internal Medicine | Admitting: Internal Medicine

## 2012-05-14 DIAGNOSIS — I5032 Chronic diastolic (congestive) heart failure: Secondary | ICD-10-CM | POA: Diagnosis not present

## 2012-05-14 DIAGNOSIS — I4891 Unspecified atrial fibrillation: Secondary | ICD-10-CM | POA: Diagnosis not present

## 2012-05-14 DIAGNOSIS — Z79899 Other long term (current) drug therapy: Secondary | ICD-10-CM | POA: Diagnosis not present

## 2012-05-14 DIAGNOSIS — IMO0002 Reserved for concepts with insufficient information to code with codable children: Secondary | ICD-10-CM | POA: Diagnosis not present

## 2012-05-14 DIAGNOSIS — K219 Gastro-esophageal reflux disease without esophagitis: Secondary | ICD-10-CM | POA: Diagnosis present

## 2012-05-14 DIAGNOSIS — J479 Bronchiectasis, uncomplicated: Secondary | ICD-10-CM | POA: Diagnosis present

## 2012-05-14 DIAGNOSIS — R269 Unspecified abnormalities of gait and mobility: Secondary | ICD-10-CM | POA: Diagnosis not present

## 2012-05-14 DIAGNOSIS — R279 Unspecified lack of coordination: Secondary | ICD-10-CM | POA: Diagnosis not present

## 2012-05-14 DIAGNOSIS — N390 Urinary tract infection, site not specified: Secondary | ICD-10-CM | POA: Diagnosis present

## 2012-05-14 DIAGNOSIS — I509 Heart failure, unspecified: Secondary | ICD-10-CM | POA: Diagnosis not present

## 2012-05-14 DIAGNOSIS — E119 Type 2 diabetes mellitus without complications: Secondary | ICD-10-CM

## 2012-05-14 DIAGNOSIS — D72829 Elevated white blood cell count, unspecified: Secondary | ICD-10-CM | POA: Diagnosis present

## 2012-05-14 DIAGNOSIS — E118 Type 2 diabetes mellitus with unspecified complications: Secondary | ICD-10-CM | POA: Diagnosis present

## 2012-05-14 DIAGNOSIS — E785 Hyperlipidemia, unspecified: Secondary | ICD-10-CM

## 2012-05-14 DIAGNOSIS — G4733 Obstructive sleep apnea (adult) (pediatric): Secondary | ICD-10-CM

## 2012-05-14 DIAGNOSIS — J4489 Other specified chronic obstructive pulmonary disease: Secondary | ICD-10-CM

## 2012-05-14 DIAGNOSIS — L97809 Non-pressure chronic ulcer of other part of unspecified lower leg with unspecified severity: Secondary | ICD-10-CM | POA: Diagnosis present

## 2012-05-14 DIAGNOSIS — J309 Allergic rhinitis, unspecified: Secondary | ICD-10-CM | POA: Diagnosis not present

## 2012-05-14 DIAGNOSIS — I739 Peripheral vascular disease, unspecified: Secondary | ICD-10-CM

## 2012-05-14 DIAGNOSIS — Z87891 Personal history of nicotine dependence: Secondary | ICD-10-CM | POA: Diagnosis not present

## 2012-05-14 DIAGNOSIS — L02419 Cutaneous abscess of limb, unspecified: Secondary | ICD-10-CM | POA: Diagnosis not present

## 2012-05-14 DIAGNOSIS — I2510 Atherosclerotic heart disease of native coronary artery without angina pectoris: Secondary | ICD-10-CM | POA: Diagnosis present

## 2012-05-14 DIAGNOSIS — J449 Chronic obstructive pulmonary disease, unspecified: Secondary | ICD-10-CM

## 2012-05-14 DIAGNOSIS — Z8673 Personal history of transient ischemic attack (TIA), and cerebral infarction without residual deficits: Secondary | ICD-10-CM

## 2012-05-14 DIAGNOSIS — G4730 Sleep apnea, unspecified: Secondary | ICD-10-CM | POA: Diagnosis present

## 2012-05-14 DIAGNOSIS — Z950 Presence of cardiac pacemaker: Secondary | ICD-10-CM | POA: Diagnosis not present

## 2012-05-14 DIAGNOSIS — S81802A Unspecified open wound, left lower leg, initial encounter: Secondary | ICD-10-CM

## 2012-05-14 DIAGNOSIS — L03119 Cellulitis of unspecified part of limb: Secondary | ICD-10-CM | POA: Diagnosis not present

## 2012-05-14 DIAGNOSIS — S32402S Unspecified fracture of left acetabulum, sequela: Secondary | ICD-10-CM

## 2012-05-14 DIAGNOSIS — M6281 Muscle weakness (generalized): Secondary | ICD-10-CM | POA: Diagnosis not present

## 2012-05-14 DIAGNOSIS — M1710 Unilateral primary osteoarthritis, unspecified knee: Secondary | ICD-10-CM | POA: Diagnosis not present

## 2012-05-14 DIAGNOSIS — S81009A Unspecified open wound, unspecified knee, initial encounter: Secondary | ICD-10-CM | POA: Diagnosis not present

## 2012-05-14 DIAGNOSIS — D649 Anemia, unspecified: Secondary | ICD-10-CM | POA: Diagnosis not present

## 2012-05-14 DIAGNOSIS — I10 Essential (primary) hypertension: Secondary | ICD-10-CM | POA: Diagnosis not present

## 2012-05-14 DIAGNOSIS — L03116 Cellulitis of left lower limb: Secondary | ICD-10-CM

## 2012-05-14 DIAGNOSIS — F411 Generalized anxiety disorder: Secondary | ICD-10-CM | POA: Diagnosis not present

## 2012-05-14 LAB — BASIC METABOLIC PANEL
Calcium: 9.2 mg/dL (ref 8.4–10.5)
Creatinine, Ser: 0.91 mg/dL (ref 0.50–1.10)
GFR calc non Af Amer: 59 mL/min — ABNORMAL LOW (ref >90)
Glucose, Bld: 136 mg/dL — ABNORMAL HIGH (ref 70–99)
Sodium: 136 mEq/L (ref 135–145)

## 2012-05-14 LAB — URINE MICROSCOPIC-ADD ON

## 2012-05-14 LAB — CBC WITH DIFFERENTIAL/PLATELET
Basophils Absolute: 0.1 10*3/uL (ref 0.0–0.1)
Basophils Relative: 1 % (ref 0–1)
Hemoglobin: 12.2 g/dL (ref 12.0–15.0)
MCHC: 32.6 g/dL (ref 30.0–36.0)
Neutro Abs: 8.4 10*3/uL — ABNORMAL HIGH (ref 1.7–7.7)
Neutrophils Relative %: 73 % (ref 43–77)
Platelets: 303 10*3/uL (ref 150–400)
RDW: 14.8 % (ref 11.5–15.5)

## 2012-05-14 LAB — GLUCOSE, CAPILLARY: Glucose-Capillary: 181 mg/dL — ABNORMAL HIGH (ref 70–99)

## 2012-05-14 LAB — LACTIC ACID, PLASMA: Lactic Acid, Venous: 2.2 mmol/L (ref 0.5–2.2)

## 2012-05-14 LAB — URINALYSIS, ROUTINE W REFLEX MICROSCOPIC
Protein, ur: NEGATIVE mg/dL
Urobilinogen, UA: 1 mg/dL (ref 0.0–1.0)

## 2012-05-14 MED ORDER — GUAIFENESIN ER 600 MG PO TB12
1200.0000 mg | ORAL_TABLET | Freq: Every day | ORAL | Status: DC
  Administered 2012-05-15 – 2012-05-17 (×3): 1200 mg via ORAL
  Filled 2012-05-14 (×3): qty 2

## 2012-05-14 MED ORDER — FERROUS SULFATE 325 (65 FE) MG PO TABS
325.0000 mg | ORAL_TABLET | Freq: Every day | ORAL | Status: DC
  Administered 2012-05-15 – 2012-05-17 (×3): 325 mg via ORAL
  Filled 2012-05-14 (×4): qty 1

## 2012-05-14 MED ORDER — ATORVASTATIN CALCIUM 20 MG PO TABS
20.0000 mg | ORAL_TABLET | Freq: Every day | ORAL | Status: DC
  Administered 2012-05-14 – 2012-05-17 (×4): 20 mg via ORAL
  Filled 2012-05-14 (×5): qty 1

## 2012-05-14 MED ORDER — APIXABAN 5 MG PO TABS
5.0000 mg | ORAL_TABLET | Freq: Two times a day (BID) | ORAL | Status: DC
  Administered 2012-05-14 – 2012-05-17 (×6): 5 mg via ORAL
  Filled 2012-05-14 (×9): qty 1

## 2012-05-14 MED ORDER — GUAIFENESIN ER 600 MG PO TB12
600.0000 mg | ORAL_TABLET | Freq: Two times a day (BID) | ORAL | Status: DC
  Filled 2012-05-14: qty 2

## 2012-05-14 MED ORDER — INSULIN ASPART 100 UNIT/ML ~~LOC~~ SOLN
0.0000 [IU] | SUBCUTANEOUS | Status: DC
  Filled 2012-05-14: qty 1

## 2012-05-14 MED ORDER — ALPRAZOLAM 0.5 MG PO TABS: 0.5000 mg | ORAL_TABLET | Freq: Four times a day (QID) | ORAL | Status: DC | PRN

## 2012-05-14 MED ORDER — FUROSEMIDE 80 MG PO TABS
80.0000 mg | ORAL_TABLET | Freq: Two times a day (BID) | ORAL | Status: DC
  Administered 2012-05-14 – 2012-05-17 (×6): 80 mg via ORAL
  Filled 2012-05-14 (×8): qty 1

## 2012-05-14 MED ORDER — ATENOLOL 25 MG PO TABS
25.0000 mg | ORAL_TABLET | Freq: Every day | ORAL | Status: DC
Start: 2012-05-15 — End: 2012-05-17
  Administered 2012-05-15 – 2012-05-17 (×3): 25 mg via ORAL
  Filled 2012-05-14 (×3): qty 1

## 2012-05-14 MED ORDER — ACETAMINOPHEN 325 MG PO TABS
650.0000 mg | ORAL_TABLET | Freq: Four times a day (QID) | ORAL | Status: DC | PRN
  Administered 2012-05-16: 650 mg via ORAL
  Filled 2012-05-14: qty 2

## 2012-05-14 MED ORDER — MAGNESIUM 250 MG PO TABS: 250.0000 mg | ORAL_TABLET | Freq: Every day | ORAL | Status: DC

## 2012-05-14 MED ORDER — VITAMIN D (ERGOCALCIFEROL) 1.25 MG (50000 UNIT) PO CAPS: 50000.0000 [IU] | ORAL_CAPSULE | ORAL | Status: DC

## 2012-05-14 MED ORDER — CILOSTAZOL 100 MG PO TABS
100.0000 mg | ORAL_TABLET | Freq: Every day | ORAL | Status: DC
  Administered 2012-05-14 – 2012-05-16 (×3): 100 mg via ORAL
  Filled 2012-05-14 (×5): qty 1

## 2012-05-14 MED ORDER — POLYETHYLENE GLYCOL 3350 17 G PO PACK
17.0000 g | PACK | Freq: Every day | ORAL | Status: DC | PRN
  Filled 2012-05-14: qty 1

## 2012-05-14 MED ORDER — METHOCARBAMOL 500 MG PO TABS
500.0000 mg | ORAL_TABLET | Freq: Four times a day (QID) | ORAL | Status: DC | PRN
  Filled 2012-05-14: qty 1

## 2012-05-14 MED ORDER — LETROZOLE 2.5 MG PO TABS
2.5000 mg | ORAL_TABLET | Freq: Every day | ORAL | Status: DC
  Administered 2012-05-15 – 2012-05-17 (×3): 2.5 mg via ORAL
  Filled 2012-05-14 (×5): qty 1

## 2012-05-14 MED ORDER — POTASSIUM CHLORIDE CRYS ER 20 MEQ PO TBCR
40.0000 meq | EXTENDED_RELEASE_TABLET | Freq: Every day | ORAL | Status: DC
  Administered 2012-05-14 – 2012-05-16 (×3): 40 meq via ORAL
  Filled 2012-05-14 (×4): qty 2

## 2012-05-14 MED ORDER — NIACINAMIDE 100 MG PO TABS: 100.0000 mg | ORAL_TABLET | Freq: Two times a day (BID) | ORAL | Status: DC

## 2012-05-14 MED ORDER — GUAIFENESIN ER 600 MG PO TB12
600.0000 mg | ORAL_TABLET | Freq: Every day | ORAL | Status: DC
  Administered 2012-05-14 – 2012-05-16 (×3): 600 mg via ORAL
  Filled 2012-05-14 (×5): qty 1

## 2012-05-14 MED ORDER — VANCOMYCIN HCL IN DEXTROSE 1-5 GM/200ML-% IV SOLN
1000.0000 mg | Freq: Two times a day (BID) | INTRAVENOUS | Status: DC
  Administered 2012-05-15: 1000 mg via INTRAVENOUS
  Filled 2012-05-14: qty 200

## 2012-05-14 MED ORDER — TRAMADOL HCL 50 MG PO TABS
50.0000 mg | ORAL_TABLET | Freq: Three times a day (TID) | ORAL | Status: DC
  Administered 2012-05-14 – 2012-05-17 (×5): 50 mg via ORAL
  Filled 2012-05-14 (×11): qty 1

## 2012-05-14 MED ORDER — LEVALBUTEROL TARTRATE 45 MCG/ACT IN AERO
2.0000 | INHALATION_SPRAY | Freq: Four times a day (QID) | RESPIRATORY_TRACT | Status: DC | PRN
  Filled 2012-05-14: qty 15

## 2012-05-14 MED ORDER — POTASSIUM CHLORIDE CRYS ER 20 MEQ PO TBCR
40.0000 meq | EXTENDED_RELEASE_TABLET | Freq: Two times a day (BID) | ORAL | Status: DC
  Filled 2012-05-14: qty 3

## 2012-05-14 MED ORDER — VANCOMYCIN HCL IN DEXTROSE 1-5 GM/200ML-% IV SOLN
1000.0000 mg | Freq: Once | INTRAVENOUS | Status: AC
  Administered 2012-05-14: 1000 mg via INTRAVENOUS
  Filled 2012-05-14: qty 200

## 2012-05-14 MED ORDER — POTASSIUM CHLORIDE CRYS ER 20 MEQ PO TBCR
60.0000 meq | EXTENDED_RELEASE_TABLET | Freq: Every day | ORAL | Status: DC
  Administered 2012-05-15 – 2012-05-17 (×3): 60 meq via ORAL
  Filled 2012-05-14 (×3): qty 3

## 2012-05-14 MED ORDER — INSULIN ASPART 100 UNIT/ML ~~LOC~~ SOLN
0.0000 [IU] | Freq: Three times a day (TID) | SUBCUTANEOUS | Status: DC
  Administered 2012-05-15: 5 [IU] via SUBCUTANEOUS
  Administered 2012-05-15 – 2012-05-16 (×2): 2 [IU] via SUBCUTANEOUS
  Administered 2012-05-16: 17:00:00 via SUBCUTANEOUS
  Administered 2012-05-17: 2 [IU] via SUBCUTANEOUS

## 2012-05-14 MED ORDER — TIOTROPIUM BROMIDE MONOHYDRATE 18 MCG IN CAPS
18.0000 ug | ORAL_CAPSULE | Freq: Every day | RESPIRATORY_TRACT | Status: DC
  Administered 2012-05-15 – 2012-05-17 (×3): 18 ug via RESPIRATORY_TRACT
  Filled 2012-05-14: qty 5

## 2012-05-14 MED ORDER — VITAMIN E 45 MG (100 UNIT) PO CAPS
100.0000 [IU] | ORAL_CAPSULE | ORAL | Status: DC
  Administered 2012-05-16: 100 [IU] via ORAL
  Filled 2012-05-14: qty 1

## 2012-05-14 MED ORDER — INSULIN ASPART 100 UNIT/ML ~~LOC~~ SOLN: 0.0000 [IU] | Freq: Three times a day (TID) | SUBCUTANEOUS | Status: DC

## 2012-05-14 MED ORDER — BUDESONIDE-FORMOTEROL FUMARATE 160-4.5 MCG/ACT IN AERO
2.0000 | INHALATION_SPRAY | Freq: Two times a day (BID) | RESPIRATORY_TRACT | Status: DC
  Administered 2012-05-14 – 2012-05-17 (×6): 2 via RESPIRATORY_TRACT
  Filled 2012-05-14 (×2): qty 6

## 2012-05-14 MED ORDER — PANTOPRAZOLE SODIUM 40 MG PO TBEC
40.0000 mg | DELAYED_RELEASE_TABLET | Freq: Every day | ORAL | Status: DC
Start: 2012-05-14 — End: 2012-05-17
  Administered 2012-05-14 – 2012-05-17 (×4): 40 mg via ORAL
  Filled 2012-05-14 (×4): qty 1

## 2012-05-14 MED ORDER — ALUM & MAG HYDROXIDE-SIMETH 200-200-20 MG/5ML PO SUSP: 30.0000 mL | Freq: Four times a day (QID) | ORAL | Status: DC | PRN

## 2012-05-14 MED ORDER — BIOTIN 1000 MCG PO TABS: 1000.0000 ug | ORAL_TABLET | Freq: Three times a day (TID) | ORAL | Status: DC

## 2012-05-14 MED ORDER — NIACIN 500 MG PO TABS
500.0000 mg | ORAL_TABLET | Freq: Two times a day (BID) | ORAL | Status: DC
  Filled 2012-05-14 (×7): qty 1

## 2012-05-14 MED ORDER — MAGNESIUM OXIDE 400 (241.3 MG) MG PO TABS
200.0000 mg | ORAL_TABLET | Freq: Every day | ORAL | Status: DC
  Administered 2012-05-15: 200 mg via ORAL
  Administered 2012-05-16: 10:00:00 via ORAL
  Administered 2012-05-17: 200 mg via ORAL
  Filled 2012-05-14 (×3): qty 0.5

## 2012-05-14 MED ORDER — DEXTROSE 5 % IV SOLN
1.0000 g | INTRAVENOUS | Status: DC
  Administered 2012-05-14 – 2012-05-16 (×3): 1 g via INTRAVENOUS
  Filled 2012-05-14 (×4): qty 10

## 2012-05-14 NOTE — Progress Notes (Signed)
PHARMACIST - PHYSICIAN ORDER COMMUNICATION  CONCERNING: P[employer] Medication Policy on Herbal Medications  DESCRIPTION:  This patient's order for:  Biotin  has been noted.  This product(s) is classified as an "herbal" or natural product. Due to a lack of definitive safety studies or FDA approval, nonstandard manufacturing practices, plus the potential risk of unknown drug-drug interactions while on inpatient medications, the Pharmacy and Therapeutics Committee does not permit the use of "herbal" or natural products of this type within [facility].   ACTION TAKEN: The pharmacy department is unable to verify this order at this time and your patient has been informed of this safety policy. Please reevaluate patient's clinical condition at discharge and address if the herbal or natural product(s) should be resumed at that time.  Lorenza Evangelist 05/14/2012 9:00 PM
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
## 2012-05-14 NOTE — ED Notes (Signed)
Pt was transferred to floor.  Sent Lab stickers with pt.

## 2012-05-14 NOTE — Progress Notes (Signed)
ANTIBIOTIC CONSULT NOTE - INITIAL  Pharmacy Consult for vancomycin Indication: LLE wound infection  Allergies  Allergen Reactions  . Cholestatin     RAGWEED SEASON    Patient Measurements: Height: 5\' 5"  (165.1 cm) Weight: 168 lb (76.204 kg) IBW/kg (Calculated) : 57 Adjusted Body Weight:   Vital Signs: Temp: 97.5 F (36.4 C) (03/08 1536) Temp src: Oral (03/08 2028) BP: 90/38 mmHg (03/08 2028) Pulse Rate: 77 (03/08 2028) Intake/Output from previous day:   Intake/Output from this shift:    Labs:  Recent Labs  05/14/12 1746 05/14/12 1823  WBC 11.6*  --   HGB 12.2  --   PLT 303  --   CREATININE  --  0.91   Estimated Creatinine Clearance: 52 ml/min (by C-G formula based on Cr of 0.91). No results found for this basename: VANCOTROUGH, VANCOPEAK, VANCORANDOM, GENTTROUGH, GENTPEAK, GENTRANDOM, TOBRATROUGH, TOBRAPEAK, TOBRARND, AMIKACINPEAK, AMIKACINTROU, AMIKACIN,  in the last 72 hours   Microbiology: No results found for this or any previous visit (from the past 720 hour(s)).  Medical History: Past Medical History  Diagnosis Date  . CVA (cerebral vascular accident)   . Sleep apnea     associated with hypersomnia  . Amiodarone pulmonary toxicity   . Renal disease     CHRONIC  . Diabetes   . Tobacco abuse   . Diastolic heart failure     Acute on Chronic  . Pacemaker     Permanent  . AF (atrial fibrillation)      AV ablation 9/09 [facility] per Dr Sampson Goon - AV node ablation 9/11 Dr Graciela Husbands  . Hyperlipidemia   . GERD (gastroesophageal reflux disease)   . [name] (coronary artery disease)     (not sure of this 11/10  . COPD (chronic obstructive pulmonary disease)     emphysema -FeV1 73% DLCO 53% 5/09  . PAD (peripheral artery disease)     w/hx right iliac/SFA stenting and left leg PTA  . Invasive ductal carcinoma of breast 2011    LEFT   . OA (osteoarthritis) of knee     RIGHT  . Right sided sciatica   . Sinoatrial node dysfunction   . CHF (congestive heart  failure)   . Mental disorder   . Acute blood loss anemia 04/20/2012    Medications:  Anti-infectives   Start     Dose/Rate Route Frequency Ordered Stop   05/15/12 0800  vancomycin (VANCOCIN) IVPB 1000 mg/200 mL premix     1,000 mg 200 mL/hr over 60 Minutes Intravenous Every 12 hours 05/14/12 2114     05/14/12 2045  cefTRIAXone (ROCEPHIN) 1 g in dextrose 5 % 50 mL IVPB     1 g 100 mL/hr over 30 Minutes Intravenous Every 24 hours 05/14/12 2037     05/14/12 2000  vancomycin (VANCOCIN) IVPB 1000 mg/200 mL premix     1,000 mg 200 mL/hr over 60 Minutes Intravenous  Once 05/14/12 1854 05/14/12 2107     Assessment: Patient with LLE wound infection.  First dose of antibiotics already given in ED.     Goal of Therapy:  Vancomycin trough level 15-20 mcg/ml  Plan:  Measure antibiotic drug levels at steady state Follow up culture results Vancomycin 750mg  iv q12hr  Aleene Davidson Crowford 05/14/2012,9:15 PM

## 2012-05-14 NOTE — ED Notes (Signed)
MD at bedside. 

## 2012-05-14 NOTE — Progress Notes (Signed)
Pt placed on cpap 10 cmH2O with 2L O2 bleed in at this time. Pt is tolerating the pressure well, but is very unhappy about the air coming out of the exhalation port. I informed her that I was sorry, but this is the only circuit we carry. I explained the importance of it & that we can't plug it off because she will begin retaining CO2. I will inform the RN because she may get mad & just take it off.  Jacqulynn Cadet RRT
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
## 2012-05-15 ENCOUNTER — Encounter (HOSPITAL_COMMUNITY): Payer: Self-pay | Admitting: *Deleted

## 2012-05-15 LAB — GLUCOSE, CAPILLARY
Glucose-Capillary: 144 mg/dL — ABNORMAL HIGH (ref 70–99)
Glucose-Capillary: 215 mg/dL — ABNORMAL HIGH (ref 70–99)
Glucose-Capillary: 105 mg/dL — ABNORMAL HIGH (ref 70–99)

## 2012-05-15 LAB — CBC
HCT: 35.6 % — ABNORMAL LOW (ref 36.0–46.0)
RDW: 15.1 % (ref 11.5–15.5)
WBC: 8.7 10*3/uL (ref 4.0–10.5)

## 2012-05-15 LAB — BASIC METABOLIC PANEL
BUN: 14 mg/dL (ref 6–23)
Chloride: 101 mEq/L (ref 96–112)
GFR calc Af Amer: 77 mL/min — ABNORMAL LOW (ref >90)
Potassium: 3.4 mEq/L — ABNORMAL LOW (ref 3.5–5.1)
Sodium: 137 mEq/L (ref 135–145)

## 2012-05-15 MED ORDER — VANCOMYCIN HCL 1000 MG IV SOLR
750.0000 mg | Freq: Two times a day (BID) | INTRAVENOUS | Status: DC
  Administered 2012-05-15 – 2012-05-17 (×4): 750 mg via INTRAVENOUS
  Filled 2012-05-15 (×5): qty 750

## 2012-05-15 MED ORDER — METFORMIN HCL 500 MG PO TABS
500.0000 mg | ORAL_TABLET | Freq: Every day | ORAL | Status: DC
  Administered 2012-05-16: 08:00:00 via ORAL
  Administered 2012-05-17: 500 mg via ORAL
  Filled 2012-05-15 (×3): qty 1

## 2012-05-15 NOTE — Progress Notes (Signed)
UR completed 
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
## 2012-05-15 NOTE — Progress Notes (Addendum)
Pt has her cpap from home & refuses to wear our machine. Inspected by myself & it is working well with no frayed wires. 2L O2 bled in to the adapter on her machine. Pt placed on at this time.   Jacqulynn Cadet RRT

## 2012-05-16 ENCOUNTER — Inpatient Hospital Stay (HOSPITAL_COMMUNITY): Payer: Medicare Other

## 2012-05-16 ENCOUNTER — Encounter (HOSPITAL_COMMUNITY): Payer: Medicare Other

## 2012-05-16 DIAGNOSIS — J309 Allergic rhinitis, unspecified: Secondary | ICD-10-CM

## 2012-05-16 LAB — HEMOGLOBIN A1C
Hgb A1c MFr Bld: 6.3 % — ABNORMAL HIGH (ref <5.7)
Mean Plasma Glucose: 134 mg/dL — ABNORMAL HIGH (ref <117)

## 2012-05-16 LAB — CBC WITH DIFFERENTIAL/PLATELET
Eosinophils Relative: 3 % (ref 0–5)
HCT: 35.8 % — ABNORMAL LOW (ref 36.0–46.0)
Hemoglobin: 11.8 g/dL — ABNORMAL LOW (ref 12.0–15.0)
Lymphocytes Relative: 23 % (ref 12–46)
Lymphs Abs: 1.7 10*3/uL (ref 0.7–4.0)
MCV: 90.9 fL (ref 78.0–100.0)
Monocytes Relative: 9 % (ref 3–12)
Platelets: 282 10*3/uL (ref 150–400)
RBC: 3.94 MIL/uL (ref 3.87–5.11)
WBC: 7.4 10*3/uL (ref 4.0–10.5)

## 2012-05-16 LAB — BASIC METABOLIC PANEL
CO2: 26 mEq/L (ref 19–32)
Calcium: 8.9 mg/dL (ref 8.4–10.5)
Chloride: 103 mEq/L (ref 96–112)
Potassium: 3.5 mEq/L (ref 3.5–5.1)
Sodium: 138 mEq/L (ref 135–145)

## 2012-05-16 LAB — GLUCOSE, CAPILLARY: Glucose-Capillary: 111 mg/dL — ABNORMAL HIGH (ref 70–99)

## 2012-05-16 MED ORDER — TECHNETIUM TC 99M MEDRONATE IV KIT
25.0000 | PACK | Freq: Once | INTRAVENOUS | Status: AC | PRN
  Administered 2012-05-16: 24 via INTRAVENOUS

## 2012-05-16 NOTE — Consult Note (Signed)
WOC consult Note Reason for Consult:Wound care eval and treat while an inpatient at [facility]. Patient is being treated by ortho as well as the outpatient wound care center. Wound type: Traumatic: left patellar notch (Full thickness, now chronic) Pressure Ulcer POA: No Measurement:1.5cm x 2.5cm x .5cm Wound bed: ruddy, red, no visible necrosis, moist, non-granular Drainage (amount, consistency, odor) small amount serosanguinous Periwound:intact, friable Dressing procedure/placement/frequency: I will suggest using a small piece of silver hydrofiber (Aquacel Ag+, [facility] [place] [phone]) in the wound, covered with Tegaderm thin film transparent dressing, with three times weekly changes (M-W-F).  Goals of care include absorption of exudate, mild local antimicrobial coverage and granulation tissue deposition. I will not follow, but will remain available to this nice woman, her family, her medical and nursing teams.   Please re-consult if needed. Thanks, Ladona Mow, MSN, RN, [facility], CWOCN [phone])
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
## 2012-05-16 NOTE — Progress Notes (Signed)
Assisted patient in putting on home CPAP machine. RN aware. 2L of oxygen bled in. Patient has on nasal mask and states she is comfortable. RT encouraged her to call if she needed any assistance. RN aware to get order modified for her home machine. RT to call [employer] to come check home machine.

## 2012-05-16 NOTE — Progress Notes (Signed)
Clinical Social Work Department BRIEF PSYCHOSOCIAL ASSESSMENT 05/16/2012  Patient:  [name], [name]     Account Number:  [id number]     Admit date:  05/14/2012  Clinical Social Worker:  Candie Chroman  Date/Time:  05/16/2012 03:32 PM  Referred by:  Physician  Date Referred:  05/14/2012 Referred for  SNF Placement   Other Referral:   Interview type:  Patient Other interview type:    PSYCHOSOCIAL DATA Living Status:  FACILITY Admitted from facility:  [facility] Level of care:  Skilled Nursing Facility Primary support name:  [name] Primary support relationship to patient:  SPOUSE Degree of support available:   unclear    CURRENT CONCERNS Current Concerns  Post-Acute Placement   Other Concerns:    SOCIAL WORK ASSESSMENT / PLAN Pt is a 79 yr old female admitted from [facility] where she was receiving rehab. CSW met with pt to assist with d/c planning. Pt plans to return to [facility] to continue rehab following hospital d/c. CSW contacted SNF and d/c plan has been confirmed. CSW will follow to assist with d/c planning to SNF.   Assessment/plan status:  Psychosocial Support/Ongoing Assessment of Needs Other assessment/ plan:   Information/referral to community resources:   None needed at this time.    PATIENT'S/FAMILY'S RESPONSE TO PLAN OF CARE: Pt looking forward to returning to [facility] to continue with rehab.   Cori Razor LCSW [phone]

## 2012-05-17 DIAGNOSIS — L03119 Cellulitis of unspecified part of limb: Secondary | ICD-10-CM | POA: Diagnosis not present

## 2012-05-17 DIAGNOSIS — J019 Acute sinusitis, unspecified: Secondary | ICD-10-CM | POA: Diagnosis not present

## 2012-05-17 DIAGNOSIS — I2510 Atherosclerotic heart disease of native coronary artery without angina pectoris: Secondary | ICD-10-CM | POA: Diagnosis not present

## 2012-05-17 DIAGNOSIS — J449 Chronic obstructive pulmonary disease, unspecified: Secondary | ICD-10-CM | POA: Diagnosis not present

## 2012-05-17 DIAGNOSIS — D649 Anemia, unspecified: Secondary | ICD-10-CM | POA: Diagnosis not present

## 2012-05-17 DIAGNOSIS — K219 Gastro-esophageal reflux disease without esophagitis: Secondary | ICD-10-CM | POA: Diagnosis not present

## 2012-05-17 DIAGNOSIS — I509 Heart failure, unspecified: Secondary | ICD-10-CM | POA: Diagnosis not present

## 2012-05-17 DIAGNOSIS — L02419 Cutaneous abscess of limb, unspecified: Secondary | ICD-10-CM | POA: Diagnosis not present

## 2012-05-17 DIAGNOSIS — S42213A Unspecified displaced fracture of surgical neck of unspecified humerus, initial encounter for closed fracture: Secondary | ICD-10-CM | POA: Diagnosis not present

## 2012-05-17 DIAGNOSIS — R269 Unspecified abnormalities of gait and mobility: Secondary | ICD-10-CM | POA: Diagnosis not present

## 2012-05-17 DIAGNOSIS — I4891 Unspecified atrial fibrillation: Secondary | ICD-10-CM | POA: Diagnosis not present

## 2012-05-17 DIAGNOSIS — R634 Abnormal weight loss: Secondary | ICD-10-CM | POA: Diagnosis not present

## 2012-05-17 DIAGNOSIS — I10 Essential (primary) hypertension: Secondary | ICD-10-CM | POA: Diagnosis not present

## 2012-05-17 DIAGNOSIS — E119 Type 2 diabetes mellitus without complications: Secondary | ICD-10-CM | POA: Diagnosis not present

## 2012-05-17 DIAGNOSIS — S62319A Displaced fracture of base of unspecified metacarpal bone, initial encounter for closed fracture: Secondary | ICD-10-CM | POA: Diagnosis not present

## 2012-05-17 DIAGNOSIS — M6281 Muscle weakness (generalized): Secondary | ICD-10-CM | POA: Diagnosis not present

## 2012-05-17 DIAGNOSIS — F329 Major depressive disorder, single episode, unspecified: Secondary | ICD-10-CM | POA: Diagnosis not present

## 2012-05-17 DIAGNOSIS — I495 Sick sinus syndrome: Secondary | ICD-10-CM | POA: Diagnosis not present

## 2012-05-17 LAB — URINE CULTURE

## 2012-05-17 LAB — GLUCOSE, CAPILLARY: Glucose-Capillary: 108 mg/dL — ABNORMAL HIGH (ref 70–99)

## 2012-05-17 MED ORDER — POTASSIUM CHLORIDE CRYS ER 20 MEQ PO TBCR: 40.0000 meq | EXTENDED_RELEASE_TABLET | Freq: Every day | ORAL | Status: DC

## 2012-05-17 MED ORDER — MAGNESIUM OXIDE 400 (241.3 MG) MG PO TABS: 200.0000 mg | ORAL_TABLET | Freq: Every day | ORAL | Status: DC

## 2012-05-17 MED ORDER — METFORMIN HCL 500 MG PO TABS: 500.0000 mg | ORAL_TABLET | Freq: Two times a day (BID) | ORAL | Status: DC

## 2012-05-17 MED ORDER — POTASSIUM CHLORIDE CRYS ER 20 MEQ PO TBCR: 60.0000 meq | EXTENDED_RELEASE_TABLET | Freq: Every day | ORAL | Status: DC

## 2012-05-17 MED ORDER — DOXYCYCLINE HYCLATE 100 MG PO TABS: 100.0000 mg | ORAL_TABLET | Freq: Two times a day (BID) | ORAL | Status: DC

## 2012-05-17 NOTE — Progress Notes (Signed)
Subjective: Feels better and ready to go  Objective: Lab: Lab Results  Component Value Date   WBC 7.4 05/16/2012   HGB 11.8* 05/16/2012   HCT 35.8* 05/16/2012   MCV 90.9 05/16/2012   PLT 282 05/16/2012   BMET    Component Value Date/Time   NA 138 05/16/2012 0420   K 3.5 05/16/2012 0420   CL 103 05/16/2012 0420   CO2 26 05/16/2012 0420   GLUCOSE 159* 05/16/2012 0420   BUN 13 05/16/2012 0420   BUN 18 07/14/2010 0922   CREATININE 0.70 05/16/2012 0420   CREATININE 1.0 07/14/2010 0922   CALCIUM 8.9 05/16/2012 0420   CALCIUM 7.7* 04/21/2012 0330   GFRNONAA 81* 05/16/2012 0420   GFRAA >90 05/16/2012 0420     Imaging:  Scheduled Meds: . apixaban  5 mg Oral BID  . atenolol  25 mg Oral Daily  . atorvastatin  20 mg Oral Daily  . budesonide-formoterol  2 puff Inhalation BID  . cefTRIAXone (ROCEPHIN)  IV  1 g Intravenous Q24H  . cilostazol  100 mg Oral QHS  . ferrous sulfate  325 mg Oral Q breakfast  . furosemide  80 mg Oral BID  . guaiFENesin  1,200 mg Oral Daily  . guaiFENesin  600 mg Oral QHS  . insulin aspart  0-15 Units Subcutaneous TID WC  . letrozole  2.5 mg Oral Daily  . magnesium oxide  200 mg Oral Daily  . metFORMIN  500 mg Oral Q breakfast  . niacin  500 mg Oral BID WC  . pantoprazole  40 mg Oral Daily  . potassium chloride  40 mEq Oral QHS  . potassium chloride  60 mEq Oral Daily  . tiotropium  18 mcg Inhalation Daily  . traMADol  50 mg Oral Q8H  . vancomycin  750 mg Intravenous Q12H  . [START ON 05/20/2012] Vitamin D (Ergocalciferol)  50,000 Units Oral Q7 days  . vitamin E  100 Units Oral 2 times weekly   Continuous Infusions:  PRN Meds:.acetaminophen, ALPRAZolam, alum & mag hydroxide-simeth, levalbuterol, methocarbamol, polyethylene glycol   Physical Exam: Filed Vitals:   05/17/12 0520  BP: 119/52  Pulse: 77  Temp: 98.3 F (36.8 C)  Resp: 16    See d/c summary    Assessment/Plan:  for return to SNF. Dictated # [id number] with brief addendum on meds.   Illene Regulus [place] [place] [facility] [phone]; (c) [phone] Call-grp - [facility]  Tele: [phone]  05/17/2012, 8:18 AM
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
## 2012-05-17 NOTE — Progress Notes (Signed)
Was referred by nursing staff to see patient. Upon arrival at her room, she was sleeping soundly. Left word with staff that I had stopped by and if further visit is needed to call Spiritual Care Dept.

## 2012-05-17 NOTE — Progress Notes (Signed)
Clinical Social Work  CSW faxed [employer] summary to [employer] who is agreeable to admission today. CSW prepared dc packet and informed patient and RN of dc. CSW coordinated transportation via [place]. CSW is signing off but available if needed.  Unk Lightning, LCSW (Coverage for [employer])

## 2012-05-18 NOTE — Discharge Summary (Signed)
NAMEJENESA, [name]              ACCOUNT NO.:  [id number]  MEDICAL RECORD NO.:  [id number]  LOCATION:  1530                         FACILITY:  [facility]  PHYSICIAN:  Rosalyn Gess. Norins, MD  DATE OF BIRTH:  February 09, 1932  DATE OF ADMISSION:  05/14/2012 DATE OF DISCHARGE:  05/17/2012                              DISCHARGE SUMMARY   ADDENDUM:  Additional medications to add to her med list include biotin 1000 mg daily, vitamin C 1000 mg daily, vitamin D 50,000 international units once weekly for an additional 10 weeks.     Rosalyn Gess Norins, MD     MEN/MEDQ  D:  05/17/2012  T:  05/18/2012  Job:  [id number]

## 2012-05-23 LAB — CULTURE, BLOOD (ROUTINE X 2)
Culture: NO GROWTH
Culture: NO GROWTH

## 2012-05-24 ENCOUNTER — Non-Acute Institutional Stay (SKILLED_NURSING_FACILITY): Payer: Medicare Other | Admitting: Internal Medicine

## 2012-05-24 ENCOUNTER — Telehealth: Payer: Self-pay

## 2012-05-24 DIAGNOSIS — I4891 Unspecified atrial fibrillation: Secondary | ICD-10-CM

## 2012-05-24 DIAGNOSIS — L02419 Cutaneous abscess of limb, unspecified: Secondary | ICD-10-CM

## 2012-05-24 DIAGNOSIS — N390 Urinary tract infection, site not specified: Secondary | ICD-10-CM

## 2012-05-24 NOTE — Telephone Encounter (Signed)
Received [facility] from [facility] on [address] [phone] needing verification on pt's rx for potassium chloride 20 mEQ if she is to take 2  Tablets by mouth at bet time or 3 tablets by mouth at daily. Please advise.

## 2012-05-25 DIAGNOSIS — S32409A Unspecified fracture of unspecified acetabulum, initial encounter for closed fracture: Secondary | ICD-10-CM | POA: Diagnosis not present

## 2012-05-25 DIAGNOSIS — S42213A Unspecified displaced fracture of surgical neck of unspecified humerus, initial encounter for closed fracture: Secondary | ICD-10-CM | POA: Diagnosis not present

## 2012-05-25 NOTE — Progress Notes (Shared)
Patient ID: [name], female   DOB: June 24, 1931, 79 y.o.   MRN: [id number]  [facility]  ALLERGIES/INTOLERANCES:  Cholestatin.   CHIEF COMPLAINT:  Manage left lower extremity cellulitis, diabetes mellitus and UTI.   HISTORY OF PRESENT ILLNESS:  79 year old, Caucasian female, who was a resident of this facility, was hospitalized secondary to gradually worsening left lower extremity wound.  The wound was erythematous and had a purulent discharge.  Therefore, she received IV rocephin and IV vancomycin with rapid response with clearing of erythema.  Leukocytosis also improved from 11.6 to 7.4.  She had a bone scan done which was negative for osteomyelitis.  Patient was transitioned to doxycycline and readmitted back to this facility for short-term rehabilitation.  She denies any pain currently.    UTI.  The patient was also diagnosed with a UTI, initially treated with rocephin, now on doxycycline.  She denies suprapubic pain, flank pain or dysuria.  However, she is complaining of external vaginal itching and yeast.    Diabetes mellitus.  The patient denies polyuria or polydipsia.  She is currently on metformin and tolerates it without any problems.  A recent hemoglobin A1C is not available.    PAST MEDICAL HISTORY:   CVA.   Sleep apnea.   Amiodarone pulmonary toxicity.   Chronic renal disease.    Diabetes mellitus.   Tobacco abuse.   CHF.   Atrial fibrillation, status post AV ablation and pacemaker placement.   Hyperlipidemia.    GERD.    Coronary artery disease.   COPD.    Peripheral arterial disease, status post right iliac stenting and left flank PTA.   Left breast invasive ductal carcinoma.   Right knee osteoarthritis.   Right-sided sciatica.    PAST SURGICAL HISTORY:   Left partial mastectomy.    Bilateral cataract extraction.   Left breast lumpectomy.   ORIF of left acetabular fracture.   SOCIAL HISTORY:  TOBACCO USE:  The patient was a smoker,  but quit four months ago.  She smoked cigarettes and had a 55-pack-year smoking history.  ALCOHOL:  She does drink alcohol.  ILLICIT DRUGS:  Denies illicit drug use.  HOUSING:  She was a resident of this facility prior to hospitalization.   FAMILY HISTORY:   FATHER:  Father had coronary artery disease.   CURRENT MEDICATIONS:  Per MAR.   REVIEW OF SYSTEMS:  See HPI.  Otherwise, 14-point review of systems is negative.   PHYSICAL EXAMINATION:  VITAL SIGNS:  PULSE:  78.  BLOOD PRESSURE:  98/52.  TEMPERATURE:  97.8.  RESPIRATIONS:  20.  GENERAL APPEARANCE:  Moderately obese.  No acute distress.    EYES:   CONJUNCTIVAE/LIDS:   Conjunctivae appear normal.  Sclerae appear normal.  Eyelids normal bilaterally.   PUPILS:  Pupils equal and reactive.   MOUTH/THROAT:  Lips without lesions.  No lesions noted in mouth.  Tongue is without lesions.  Oropharynx without redness or lesions.  Uvula elevates midline.  Teeth are in good repair.   NECK/THYROID:   Supple.  No elevation of the jugular venous pulsation.  Trachea midline.  No neck masses.  No thyroid tenderness.  No thyroid nodule.  No thyroid enlargement.   CHEST/RESPIRATORY:  Breathing is even and unlabored.  Breath sounds are clear to auscultation bilaterally.  Chest is nontender and normal in contour.   CARDIOVASCULAR:   CARDIAC:  Rhythm regular.  No murmur.  No extra heart sounds.   ARTERIAL:  Normal pedal pulses.  EDEMA/VARICOSITIES:  No edema.   GASTROINTESTINAL:   ABDOMEN:  Soft.  Normal bowel sounds.  No masses are noted.  No tenderness is present.  No abdominal bruit.   LIVER/SPLEEN/KIDNEY:  No hepatomegaly or tenderness is noted.  No splenomegaly.   HERNIA:  No abdominal wall hernia.   MUSCULOSKELETAL:    HEAD:  Normal to inspection and palpation.   BACK:  No kyphosis.  No scoliosis.  No spinous process tenderness.   EXTREMITIES:   LEFT UPPER EXTREMITY:  Strength decreased secondary to pain.  Range of motion unable to perform.   RIGHT UPPER EXTREMITY:  Full range of motion.  Normal strength and tone.   LEFT LOWER EXTREMITY:  Full range of motion.  Normal strength and tone.   RIGHT LOWER EXTREMITY:  Full range of motion.  Normal strength and tone.   PSYCHIATRIC:  The patient is alert and oriented to self and place.  Affect and behavior are appropriate.    LABORATORY DATA:  Left tibia-fibula x-ray:  Did not show acute osseous abnormalities.    White count 7.4, hemoglobin 11.8, platelets 282.    Sodium 138, potassium 3.5, CO2 26, BUN 13, creatinine 0.7, calcium 8.9.    ASSESSMENT/PLAN:   Left lower extremity wound cellulitis (            ).  Continue doxycycline as prescribed.   UTI 599.0.   Should be covered with doxycycline.   Diabetes mellitus (            ).  Continue metformin.  Monitor CBGs.   Atrial fibrillation 427.31.  Rate controlled.   Left proximal humeral fracture (            ).  Continue rehabilitation.    External vaginal yeast (            ).  Uncontrolled problem.  She seems to be refractory to topical treatment.  Therefore, start fluconazole 100 mg for five days.    COPD 496.  Stable.   CHF 428.0.  Adequately controlled.   Coronary artery disease 414.01.  Stable.    V58.69.  Check CBC and BMP.   I have reviewed patient's medical records from hospitalization.   CPT CODE:  78295.

## 2012-05-26 ENCOUNTER — Encounter: Payer: Medicare Other | Admitting: Internal Medicine

## 2012-06-01 ENCOUNTER — Non-Acute Institutional Stay (SKILLED_NURSING_FACILITY): Payer: Medicare Other | Admitting: Adult Health

## 2012-06-01 DIAGNOSIS — F329 Major depressive disorder, single episode, unspecified: Secondary | ICD-10-CM

## 2012-06-01 DIAGNOSIS — R634 Abnormal weight loss: Secondary | ICD-10-CM | POA: Diagnosis not present

## 2012-06-06 ENCOUNTER — Encounter: Payer: Self-pay | Admitting: Adult Health

## 2012-06-06 DIAGNOSIS — F32A Depression, unspecified: Secondary | ICD-10-CM

## 2012-06-06 DIAGNOSIS — F329 Major depressive disorder, single episode, unspecified: Secondary | ICD-10-CM | POA: Insufficient documentation

## 2012-06-06 DIAGNOSIS — R634 Abnormal weight loss: Secondary | ICD-10-CM | POA: Insufficient documentation

## 2012-06-06 HISTORY — DX: Depression, unspecified: F32.A

## 2012-06-06 MED ORDER — MIRTAZAPINE 15 MG PO TABS: 15.0000 mg | ORAL_TABLET | Freq: Every day | ORAL | Status: DC

## 2012-06-06 NOTE — Progress Notes (Signed)
  Subjective:    Patient ID: [name], female    DOB: 07-24-1931, 79 y.o.   MRN: [id number]  HPI This is a 79 year old Caucasian female who was noted to have sad affect. She was noted to have a significant weight loss - 5.4% in 33 days. Meal intake reported to be 75-100 %.   Review of Systems  Constitutional: Positive for appetite change.       Poor appetite  HENT: Negative.   Respiratory: Negative for chest tightness and shortness of breath.   Cardiovascular: Negative for chest pain and leg swelling.  Gastrointestinal: Negative for abdominal pain and abdominal distention.  Endocrine: Negative.   Genitourinary: Negative.   Neurological: Negative for dizziness and headaches.  Hematological: Negative for adenopathy. Does not bruise/bleed easily.       Objective:   Physical Exam  Constitutional: She is oriented to person, place, and time. She appears well-developed.  HENT:  Head: Normocephalic.  Right Ear: External ear normal.  Left Ear: External ear normal.  Eyes: Conjunctivae are normal. Pupils are equal, round, and reactive to light.  Neck: Normal range of motion. Neck supple.  Cardiovascular: Normal rate and normal heart sounds.   Pulmonary/Chest: Effort normal and breath sounds normal.  Abdominal: Soft. Bowel sounds are normal.  Musculoskeletal: Normal range of motion. She exhibits no edema and no tenderness.  Neurological: She is alert and oriented to person, place, and time.  Skin: Skin is warm and dry.  Psychiatric:  Sad affect          Assessment & Plan:  Depression -     Start Remeron 15 mg PO Q HS  Weight Loss - start Medpass 90 cc PO TID

## 2012-06-16 DIAGNOSIS — I495 Sick sinus syndrome: Secondary | ICD-10-CM

## 2012-06-20 ENCOUNTER — Non-Acute Institutional Stay (SKILLED_NURSING_FACILITY): Payer: Medicare Other | Admitting: Adult Health

## 2012-06-20 DIAGNOSIS — J019 Acute sinusitis, unspecified: Secondary | ICD-10-CM | POA: Diagnosis not present

## 2012-06-20 DIAGNOSIS — I5030 Unspecified diastolic (congestive) heart failure: Secondary | ICD-10-CM | POA: Diagnosis not present

## 2012-06-20 DIAGNOSIS — E119 Type 2 diabetes mellitus without complications: Secondary | ICD-10-CM | POA: Diagnosis not present

## 2012-06-20 DIAGNOSIS — E785 Hyperlipidemia, unspecified: Secondary | ICD-10-CM | POA: Diagnosis not present

## 2012-06-20 DIAGNOSIS — K219 Gastro-esophageal reflux disease without esophagitis: Secondary | ICD-10-CM | POA: Diagnosis not present

## 2012-06-29 DIAGNOSIS — S62319A Displaced fracture of base of unspecified metacarpal bone, initial encounter for closed fracture: Secondary | ICD-10-CM | POA: Diagnosis not present

## 2012-06-29 DIAGNOSIS — S32409A Unspecified fracture of unspecified acetabulum, initial encounter for closed fracture: Secondary | ICD-10-CM | POA: Diagnosis not present

## 2012-06-29 DIAGNOSIS — S42213A Unspecified displaced fracture of surgical neck of unspecified humerus, initial encounter for closed fracture: Secondary | ICD-10-CM | POA: Diagnosis not present

## 2012-07-11 ENCOUNTER — Other Ambulatory Visit: Payer: Self-pay | Admitting: Physician Assistant

## 2012-07-11 DIAGNOSIS — C50919 Malignant neoplasm of unspecified site of unspecified female breast: Secondary | ICD-10-CM

## 2012-07-12 ENCOUNTER — Other Ambulatory Visit: Payer: Medicare Other | Admitting: Lab

## 2012-07-12 ENCOUNTER — Non-Acute Institutional Stay (SKILLED_NURSING_FACILITY): Payer: Medicare Other | Admitting: Adult Health

## 2012-07-12 DIAGNOSIS — I5030 Unspecified diastolic (congestive) heart failure: Secondary | ICD-10-CM | POA: Diagnosis not present

## 2012-07-12 DIAGNOSIS — K219 Gastro-esophageal reflux disease without esophagitis: Secondary | ICD-10-CM

## 2012-07-12 DIAGNOSIS — I739 Peripheral vascular disease, unspecified: Secondary | ICD-10-CM

## 2012-07-12 DIAGNOSIS — J449 Chronic obstructive pulmonary disease, unspecified: Secondary | ICD-10-CM

## 2012-07-12 DIAGNOSIS — E119 Type 2 diabetes mellitus without complications: Secondary | ICD-10-CM | POA: Diagnosis not present

## 2012-07-12 DIAGNOSIS — J4489 Other specified chronic obstructive pulmonary disease: Secondary | ICD-10-CM

## 2012-07-12 DIAGNOSIS — I509 Heart failure, unspecified: Secondary | ICD-10-CM

## 2012-07-12 DIAGNOSIS — I4891 Unspecified atrial fibrillation: Secondary | ICD-10-CM

## 2012-07-12 DIAGNOSIS — E785 Hyperlipidemia, unspecified: Secondary | ICD-10-CM

## 2012-07-12 DIAGNOSIS — J019 Acute sinusitis, unspecified: Secondary | ICD-10-CM | POA: Diagnosis not present

## 2012-07-18 DIAGNOSIS — S42309D Unspecified fracture of shaft of humerus, unspecified arm, subsequent encounter for fracture with routine healing: Secondary | ICD-10-CM | POA: Diagnosis not present

## 2012-07-18 DIAGNOSIS — E119 Type 2 diabetes mellitus without complications: Secondary | ICD-10-CM | POA: Diagnosis not present

## 2012-07-18 DIAGNOSIS — IMO0001 Reserved for inherently not codable concepts without codable children: Secondary | ICD-10-CM | POA: Diagnosis not present

## 2012-07-18 DIAGNOSIS — J441 Chronic obstructive pulmonary disease with (acute) exacerbation: Secondary | ICD-10-CM | POA: Diagnosis not present

## 2012-07-18 DIAGNOSIS — I5032 Chronic diastolic (congestive) heart failure: Secondary | ICD-10-CM | POA: Diagnosis not present

## 2012-07-19 ENCOUNTER — Encounter: Payer: Self-pay | Admitting: Adult Health

## 2012-07-19 ENCOUNTER — Ambulatory Visit: Payer: Medicare Other | Admitting: Oncology

## 2012-07-19 DIAGNOSIS — E119 Type 2 diabetes mellitus without complications: Secondary | ICD-10-CM | POA: Diagnosis not present

## 2012-07-19 DIAGNOSIS — I5032 Chronic diastolic (congestive) heart failure: Secondary | ICD-10-CM | POA: Diagnosis not present

## 2012-07-19 DIAGNOSIS — K219 Gastro-esophageal reflux disease without esophagitis: Secondary | ICD-10-CM | POA: Insufficient documentation

## 2012-07-19 DIAGNOSIS — J441 Chronic obstructive pulmonary disease with (acute) exacerbation: Secondary | ICD-10-CM | POA: Diagnosis not present

## 2012-07-19 DIAGNOSIS — IMO0001 Reserved for inherently not codable concepts without codable children: Secondary | ICD-10-CM | POA: Diagnosis not present

## 2012-07-19 DIAGNOSIS — J019 Acute sinusitis, unspecified: Secondary | ICD-10-CM | POA: Insufficient documentation

## 2012-07-19 DIAGNOSIS — S42309D Unspecified fracture of shaft of humerus, unspecified arm, subsequent encounter for fracture with routine healing: Secondary | ICD-10-CM | POA: Diagnosis not present
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
## 2012-07-19 NOTE — Progress Notes (Signed)
  Subjective:    Patient ID: [name], female    DOB: 1931-08-09, 79 y.o.   MRN: [id number]  HPI This is a 79 year old female who complained of greenish mucus coming out of her nostrils and that she feels congested on her nostrils.   Review of Systems  Constitutional: Negative.   HENT: Negative.   Eyes: Negative.   Respiratory: Negative for cough, shortness of breath and wheezing.   Cardiovascular: Negative for leg swelling.  Gastrointestinal: Negative.   Endocrine: Negative.   Genitourinary: Negative.   Musculoskeletal: Negative.   Neurological: Negative.   Hematological: Negative for adenopathy. Does not bruise/bleed easily.  Psychiatric/Behavioral: Negative.        Objective:   Physical Exam  Nursing note and vitals reviewed. Constitutional: She is oriented to person, place, and time. She appears well-developed and well-nourished.  HENT:  Head: Normocephalic and atraumatic.  Right Ear: External ear normal.  Left Ear: External ear normal.  Nose: Nose normal.  Mouth/Throat: Oropharynx is clear and moist.  No facial tenderness  Eyes: Conjunctivae and EOM are normal. Pupils are equal, round, and reactive to light.  Neck: Normal range of motion. Neck supple.  Cardiovascular: Normal rate, regular rhythm and normal heart sounds.   Pulmonary/Chest: Effort normal and breath sounds normal. No respiratory distress.  Abdominal: Soft. Bowel sounds are normal.  Musculoskeletal: Normal range of motion. She exhibits no edema and no tenderness.  Neurological: She is alert and oriented to person, place, and time.  Skin: Skin is warm and dry.  Psychiatric: She has a normal mood and affect. Her behavior is normal. Judgment and thought content normal.    LABS: 06/15/12  Wbc 8.1  hgb 11.8  hct 36.0  hgbA1c 6.8 05/30/12  Wbc 10.6  hgb 11.1  hct 32.6  Bmp nl except glucose 123  Medications reviewed per [facility]    Assessment & Plan:   Acute Sinus Infection -  Start Doxycycline 100 mg 1  tab PO BID x 1 week

## 2012-07-20 ENCOUNTER — Encounter: Payer: Self-pay | Admitting: Internal Medicine

## 2012-07-20 ENCOUNTER — Ambulatory Visit (INDEPENDENT_AMBULATORY_CARE_PROVIDER_SITE_OTHER): Payer: Medicare Other | Admitting: Internal Medicine

## 2012-07-20 ENCOUNTER — Encounter (INDEPENDENT_AMBULATORY_CARE_PROVIDER_SITE_OTHER): Payer: Medicare Other | Admitting: Internal Medicine

## 2012-07-20 VITALS — BP 120/60 | HR 76 | Temp 97.8°F | Resp 16 | Wt 167.0 lb

## 2012-07-20 DIAGNOSIS — D649 Anemia, unspecified: Secondary | ICD-10-CM

## 2012-07-20 DIAGNOSIS — I4891 Unspecified atrial fibrillation: Secondary | ICD-10-CM

## 2012-07-20 DIAGNOSIS — R209 Unspecified disturbances of skin sensation: Secondary | ICD-10-CM

## 2012-07-20 DIAGNOSIS — I2510 Atherosclerotic heart disease of native coronary artery without angina pectoris: Secondary | ICD-10-CM

## 2012-07-20 DIAGNOSIS — E119 Type 2 diabetes mellitus without complications: Secondary | ICD-10-CM

## 2012-07-20 DIAGNOSIS — E559 Vitamin D deficiency, unspecified: Secondary | ICD-10-CM | POA: Diagnosis not present

## 2012-07-20 DIAGNOSIS — R202 Paresthesia of skin: Secondary | ICD-10-CM

## 2012-07-20 DIAGNOSIS — E785 Hyperlipidemia, unspecified: Secondary | ICD-10-CM | POA: Diagnosis not present

## 2012-07-20 LAB — CBC WITH DIFFERENTIAL/PLATELET
Basophils Absolute: 0.1 10*3/uL (ref 0.0–0.1)
Basophils Relative: 0.5 % (ref 0.0–3.0)
Eosinophils Absolute: 0.2 10*3/uL (ref 0.0–0.7)
Eosinophils Relative: 1.9 % (ref 0.0–5.0)
HCT: 38.3 % (ref 36.0–46.0)
Hemoglobin: 12.8 g/dL (ref 12.0–15.0)
Lymphocytes Relative: 18.4 % (ref 12.0–46.0)
Lymphs Abs: 2 10*3/uL (ref 0.7–4.0)
MCHC: 33.6 g/dL (ref 30.0–36.0)
MCV: 86.7 fl (ref 78.0–100.0)
Monocytes Absolute: 0.9 10*3/uL (ref 0.1–1.0)
Monocytes Relative: 8.2 % (ref 3.0–12.0)
Neutro Abs: 7.7 10*3/uL (ref 1.4–7.7)
Neutrophils Relative %: 71 % (ref 43.0–77.0)
Platelets: 252 10*3/uL (ref 150.0–400.0)
RBC: 4.41 Mil/uL (ref 3.87–5.11)
RDW: 17.8 % — ABNORMAL HIGH (ref 11.5–14.6)
WBC: 10.9 10*3/uL — ABNORMAL HIGH (ref 4.5–10.5)

## 2012-07-20 LAB — BASIC METABOLIC PANEL
BUN: 12 mg/dL (ref 6–23)
Creatinine, Ser: 0.9 mg/dL (ref 0.4–1.2)
GFR: 65.87 mL/min (ref >60.00)
Glucose, Bld: 126 mg/dL — ABNORMAL HIGH (ref 70–99)
Potassium: 4.1 mEq/L (ref 3.5–5.1)

## 2012-07-20 LAB — HEPATIC FUNCTION PANEL
ALT: 9 U/L (ref 0–35)
AST: 19 U/L (ref 0–37)
Alkaline Phosphatase: 77 U/L (ref 39–117)
Bilirubin, Direct: 0.1 mg/dL (ref 0.0–0.3)
Total Bilirubin: 0.6 mg/dL (ref 0.3–1.2)

## 2012-07-20 LAB — TSH: TSH: 2.2 u[IU]/mL (ref 0.35–5.50)

## 2012-07-20 MED ORDER — TORSEMIDE 10 MG PO TABS: 10.0000 mg | ORAL_TABLET | Freq: Every day | ORAL | Status: DC

## 2012-07-20 NOTE — Assessment & Plan Note (Signed)
Continue with current prescription therapy as reflected on the Med list.  
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
## 2012-07-20 NOTE — Assessment & Plan Note (Signed)
On vit d - ?compliance Labs

## 2012-07-21 DIAGNOSIS — IMO0001 Reserved for inherently not codable concepts without codable children: Secondary | ICD-10-CM | POA: Diagnosis not present

## 2012-07-21 DIAGNOSIS — J441 Chronic obstructive pulmonary disease with (acute) exacerbation: Secondary | ICD-10-CM | POA: Diagnosis not present

## 2012-07-21 DIAGNOSIS — S42309D Unspecified fracture of shaft of humerus, unspecified arm, subsequent encounter for fracture with routine healing: Secondary | ICD-10-CM | POA: Diagnosis not present

## 2012-07-21 DIAGNOSIS — I5032 Chronic diastolic (congestive) heart failure: Secondary | ICD-10-CM | POA: Diagnosis not present

## 2012-07-21 DIAGNOSIS — E119 Type 2 diabetes mellitus without complications: Secondary | ICD-10-CM | POA: Diagnosis not present

## 2012-07-21 MED ORDER — VITAMIN D3 1.25 MG (50000 UT) PO CAPS: 1.0000 | ORAL_CAPSULE | ORAL | Status: DC

## 2012-07-21 MED ORDER — VITAMIN D 1000 UNITS PO TABS: 1000.0000 [IU] | ORAL_TABLET | Freq: Every day | ORAL | Status: DC

## 2012-07-21 MED ORDER — VITAMIN B-12 500 MCG SL SUBL: SUBLINGUAL_TABLET | SUBLINGUAL | Status: DC

## 2012-07-22 DIAGNOSIS — E119 Type 2 diabetes mellitus without complications: Secondary | ICD-10-CM | POA: Diagnosis not present

## 2012-07-22 DIAGNOSIS — I5032 Chronic diastolic (congestive) heart failure: Secondary | ICD-10-CM | POA: Diagnosis not present

## 2012-07-22 DIAGNOSIS — S42309D Unspecified fracture of shaft of humerus, unspecified arm, subsequent encounter for fracture with routine healing: Secondary | ICD-10-CM | POA: Diagnosis not present

## 2012-07-22 DIAGNOSIS — J441 Chronic obstructive pulmonary disease with (acute) exacerbation: Secondary | ICD-10-CM | POA: Diagnosis not present

## 2012-07-22 DIAGNOSIS — IMO0001 Reserved for inherently not codable concepts without codable children: Secondary | ICD-10-CM | POA: Diagnosis not present

## 2012-07-25 DIAGNOSIS — S42309D Unspecified fracture of shaft of humerus, unspecified arm, subsequent encounter for fracture with routine healing: Secondary | ICD-10-CM | POA: Diagnosis not present

## 2012-07-25 DIAGNOSIS — I5032 Chronic diastolic (congestive) heart failure: Secondary | ICD-10-CM | POA: Diagnosis not present

## 2012-07-25 DIAGNOSIS — J441 Chronic obstructive pulmonary disease with (acute) exacerbation: Secondary | ICD-10-CM | POA: Diagnosis not present

## 2012-07-25 DIAGNOSIS — IMO0001 Reserved for inherently not codable concepts without codable children: Secondary | ICD-10-CM | POA: Diagnosis not present

## 2012-07-25 DIAGNOSIS — E119 Type 2 diabetes mellitus without complications: Secondary | ICD-10-CM | POA: Diagnosis not present

## 2012-07-26 ENCOUNTER — Telehealth: Payer: Self-pay | Admitting: Internal Medicine

## 2012-07-26 ENCOUNTER — Telehealth: Payer: Self-pay

## 2012-07-26 DIAGNOSIS — I5032 Chronic diastolic (congestive) heart failure: Secondary | ICD-10-CM | POA: Diagnosis not present

## 2012-07-26 DIAGNOSIS — S42309D Unspecified fracture of shaft of humerus, unspecified arm, subsequent encounter for fracture with routine healing: Secondary | ICD-10-CM | POA: Diagnosis not present

## 2012-07-26 DIAGNOSIS — E119 Type 2 diabetes mellitus without complications: Secondary | ICD-10-CM | POA: Diagnosis not present

## 2012-07-26 DIAGNOSIS — IMO0001 Reserved for inherently not codable concepts without codable children: Secondary | ICD-10-CM | POA: Diagnosis not present

## 2012-07-26 DIAGNOSIS — J441 Chronic obstructive pulmonary disease with (acute) exacerbation: Secondary | ICD-10-CM | POA: Diagnosis not present

## 2012-07-26 MED ORDER — METFORMIN HCL 500 MG PO TABS: 500.0000 mg | ORAL_TABLET | Freq: Two times a day (BID) | ORAL | Status: DC

## 2012-07-26 MED ORDER — APIXABAN 5 MG PO TABS: 5.0000 mg | ORAL_TABLET | Freq: Two times a day (BID) | ORAL | Status: DC

## 2012-07-26 MED ORDER — ATORVASTATIN CALCIUM 20 MG PO TABS: 20.0000 mg | ORAL_TABLET | Freq: Every day | ORAL | Status: DC

## 2012-07-26 NOTE — Progress Notes (Signed)
This encounter was created in error - please disregard.

## 2012-07-26 NOTE — Telephone Encounter (Signed)
Junious Dresser, Occupational therapist for [name] is requesting a verbal order for OT 2 times a week for 4 weeks, call Junious Dresser at [phone]

## 2012-07-26 NOTE — Telephone Encounter (Signed)
Pt calls requesting a refill on Lipitor, Metformin, and Eliquis sent to [facility] for 90 day supply and also a [facility] [address] until she gets her mail order. This was done today.

## 2012-07-26 NOTE — Telephone Encounter (Signed)
Ok Thx 

## 2012-07-27 DIAGNOSIS — IMO0001 Reserved for inherently not codable concepts without codable children: Secondary | ICD-10-CM

## 2012-07-27 DIAGNOSIS — J441 Chronic obstructive pulmonary disease with (acute) exacerbation: Secondary | ICD-10-CM

## 2012-07-27 DIAGNOSIS — S42309D Unspecified fracture of shaft of humerus, unspecified arm, subsequent encounter for fracture with routine healing: Secondary | ICD-10-CM | POA: Diagnosis not present

## 2012-07-27 DIAGNOSIS — I739 Peripheral vascular disease, unspecified: Secondary | ICD-10-CM

## 2012-07-27 DIAGNOSIS — I5032 Chronic diastolic (congestive) heart failure: Secondary | ICD-10-CM | POA: Diagnosis not present

## 2012-07-27 DIAGNOSIS — E119 Type 2 diabetes mellitus without complications: Secondary | ICD-10-CM | POA: Diagnosis not present

## 2012-07-27 NOTE — Telephone Encounter (Signed)
Lm on voicemail for [name] that it is okay for OT 2 x a week for 4 weeks

## 2012-07-28 DIAGNOSIS — I5032 Chronic diastolic (congestive) heart failure: Secondary | ICD-10-CM | POA: Diagnosis not present

## 2012-07-28 DIAGNOSIS — S42309D Unspecified fracture of shaft of humerus, unspecified arm, subsequent encounter for fracture with routine healing: Secondary | ICD-10-CM | POA: Diagnosis not present

## 2012-07-28 DIAGNOSIS — IMO0001 Reserved for inherently not codable concepts without codable children: Secondary | ICD-10-CM | POA: Diagnosis not present

## 2012-07-28 DIAGNOSIS — E119 Type 2 diabetes mellitus without complications: Secondary | ICD-10-CM | POA: Diagnosis not present

## 2012-07-28 DIAGNOSIS — J441 Chronic obstructive pulmonary disease with (acute) exacerbation: Secondary | ICD-10-CM | POA: Diagnosis not present

## 2012-07-29 ENCOUNTER — Telehealth: Payer: Self-pay

## 2012-07-29 DIAGNOSIS — J441 Chronic obstructive pulmonary disease with (acute) exacerbation: Secondary | ICD-10-CM | POA: Diagnosis not present

## 2012-07-29 DIAGNOSIS — IMO0001 Reserved for inherently not codable concepts without codable children: Secondary | ICD-10-CM | POA: Diagnosis not present

## 2012-07-29 DIAGNOSIS — S42309D Unspecified fracture of shaft of humerus, unspecified arm, subsequent encounter for fracture with routine healing: Secondary | ICD-10-CM | POA: Diagnosis not present

## 2012-07-29 DIAGNOSIS — E119 Type 2 diabetes mellitus without complications: Secondary | ICD-10-CM | POA: Diagnosis not present

## 2012-07-29 DIAGNOSIS — I5032 Chronic diastolic (congestive) heart failure: Secondary | ICD-10-CM | POA: Diagnosis not present

## 2012-07-29 MED ORDER — TORSEMIDE 10 MG PO TABS: 10.0000 mg | ORAL_TABLET | Freq: Two times a day (BID) | ORAL | Status: DC

## 2012-07-29 NOTE — Telephone Encounter (Signed)
Demadex: Start 20 mg/day. If not better in 4-5 d start 30 mg q am  Thx

## 2012-07-29 NOTE — Telephone Encounter (Signed)
Pt notified and new rx sent to pharmacy.  

## 2012-07-29 NOTE — Telephone Encounter (Signed)
Phone call from [name] nurse. She states patient was seen 07/20/12 and was taken off Lasix 80 mg and put on Torsemide 10 mg daily. Patient has increased left ankle swelling. Patient has not weighed but looks as if she has gained a couple pounds. Patient is in no distress or SOB. Please advise.

## 2012-08-02 DIAGNOSIS — S42309D Unspecified fracture of shaft of humerus, unspecified arm, subsequent encounter for fracture with routine healing: Secondary | ICD-10-CM | POA: Diagnosis not present

## 2012-08-02 DIAGNOSIS — E119 Type 2 diabetes mellitus without complications: Secondary | ICD-10-CM | POA: Diagnosis not present

## 2012-08-02 DIAGNOSIS — J441 Chronic obstructive pulmonary disease with (acute) exacerbation: Secondary | ICD-10-CM | POA: Diagnosis not present

## 2012-08-02 DIAGNOSIS — I5032 Chronic diastolic (congestive) heart failure: Secondary | ICD-10-CM | POA: Diagnosis not present

## 2012-08-02 DIAGNOSIS — IMO0001 Reserved for inherently not codable concepts without codable children: Secondary | ICD-10-CM | POA: Diagnosis not present

## 2012-08-03 DIAGNOSIS — S32409A Unspecified fracture of unspecified acetabulum, initial encounter for closed fracture: Secondary | ICD-10-CM | POA: Diagnosis not present

## 2012-08-03 DIAGNOSIS — IMO0001 Reserved for inherently not codable concepts without codable children: Secondary | ICD-10-CM | POA: Diagnosis not present

## 2012-08-03 DIAGNOSIS — S62319A Displaced fracture of base of unspecified metacarpal bone, initial encounter for closed fracture: Secondary | ICD-10-CM | POA: Diagnosis not present

## 2012-08-03 DIAGNOSIS — S42213A Unspecified displaced fracture of surgical neck of unspecified humerus, initial encounter for closed fracture: Secondary | ICD-10-CM | POA: Diagnosis not present

## 2012-08-04 DIAGNOSIS — S42309D Unspecified fracture of shaft of humerus, unspecified arm, subsequent encounter for fracture with routine healing: Secondary | ICD-10-CM | POA: Diagnosis not present

## 2012-08-04 DIAGNOSIS — I5032 Chronic diastolic (congestive) heart failure: Secondary | ICD-10-CM | POA: Diagnosis not present

## 2012-08-04 DIAGNOSIS — IMO0001 Reserved for inherently not codable concepts without codable children: Secondary | ICD-10-CM | POA: Diagnosis not present

## 2012-08-04 DIAGNOSIS — E119 Type 2 diabetes mellitus without complications: Secondary | ICD-10-CM | POA: Diagnosis not present

## 2012-08-04 DIAGNOSIS — J441 Chronic obstructive pulmonary disease with (acute) exacerbation: Secondary | ICD-10-CM | POA: Diagnosis not present

## 2012-08-05 DIAGNOSIS — I5032 Chronic diastolic (congestive) heart failure: Secondary | ICD-10-CM | POA: Diagnosis not present

## 2012-08-05 DIAGNOSIS — IMO0001 Reserved for inherently not codable concepts without codable children: Secondary | ICD-10-CM | POA: Diagnosis not present

## 2012-08-05 DIAGNOSIS — S42309D Unspecified fracture of shaft of humerus, unspecified arm, subsequent encounter for fracture with routine healing: Secondary | ICD-10-CM | POA: Diagnosis not present

## 2012-08-05 DIAGNOSIS — J441 Chronic obstructive pulmonary disease with (acute) exacerbation: Secondary | ICD-10-CM | POA: Diagnosis not present

## 2012-08-05 DIAGNOSIS — E119 Type 2 diabetes mellitus without complications: Secondary | ICD-10-CM | POA: Diagnosis not present

## 2012-08-08 ENCOUNTER — Encounter: Payer: Self-pay | Admitting: Critical Care Medicine

## 2012-08-08 ENCOUNTER — Ambulatory Visit (INDEPENDENT_AMBULATORY_CARE_PROVIDER_SITE_OTHER): Payer: Medicare Other | Admitting: Critical Care Medicine

## 2012-08-08 VITALS — BP 112/60 | HR 84 | Temp 97.8°F | Ht 65.0 in | Wt 162.0 lb

## 2012-08-08 DIAGNOSIS — J441 Chronic obstructive pulmonary disease with (acute) exacerbation: Secondary | ICD-10-CM | POA: Diagnosis not present

## 2012-08-08 DIAGNOSIS — J449 Chronic obstructive pulmonary disease, unspecified: Secondary | ICD-10-CM

## 2012-08-08 DIAGNOSIS — J4489 Other specified chronic obstructive pulmonary disease: Secondary | ICD-10-CM

## 2012-08-08 DIAGNOSIS — IMO0001 Reserved for inherently not codable concepts without codable children: Secondary | ICD-10-CM | POA: Diagnosis not present

## 2012-08-08 DIAGNOSIS — E119 Type 2 diabetes mellitus without complications: Secondary | ICD-10-CM | POA: Diagnosis not present

## 2012-08-08 DIAGNOSIS — I5032 Chronic diastolic (congestive) heart failure: Secondary | ICD-10-CM | POA: Diagnosis not present

## 2012-08-08 DIAGNOSIS — S42309D Unspecified fracture of shaft of humerus, unspecified arm, subsequent encounter for fracture with routine healing: Secondary | ICD-10-CM | POA: Diagnosis not present

## 2012-08-08 MED ORDER — FLUTICASONE-SALMETEROL 250-50 MCG/DOSE IN AEPB
1.0000 | INHALATION_SPRAY | Freq: Two times a day (BID) | RESPIRATORY_TRACT | Status: DC
Start: 2012-08-08 — End: 2013-01-31

## 2012-08-08 NOTE — Patient Instructions (Addendum)
Stop symbicort Start back advair 250 one puff twice daily Stay on Spiriva Return 3 months

## 2012-08-08 NOTE — Assessment & Plan Note (Signed)
Gold stage C. COPD with stable lung function at this time Plan The patient is more comfortable with Advair and we will switch this patient back to Advair 250 one puff twice daily and discontinue the Symbicort which the patient received at the rehabilitation facility Continue oxygen therapy as prescribed 2 L at bedtime only with CPAP Continue Spiriva
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
## 2012-08-09 DIAGNOSIS — I5032 Chronic diastolic (congestive) heart failure: Secondary | ICD-10-CM | POA: Diagnosis not present

## 2012-08-09 DIAGNOSIS — E119 Type 2 diabetes mellitus without complications: Secondary | ICD-10-CM | POA: Diagnosis not present

## 2012-08-09 DIAGNOSIS — S42309D Unspecified fracture of shaft of humerus, unspecified arm, subsequent encounter for fracture with routine healing: Secondary | ICD-10-CM | POA: Diagnosis not present

## 2012-08-09 DIAGNOSIS — J441 Chronic obstructive pulmonary disease with (acute) exacerbation: Secondary | ICD-10-CM | POA: Diagnosis not present

## 2012-08-09 DIAGNOSIS — IMO0001 Reserved for inherently not codable concepts without codable children: Secondary | ICD-10-CM | POA: Diagnosis not present

## 2012-08-10 DIAGNOSIS — S42309D Unspecified fracture of shaft of humerus, unspecified arm, subsequent encounter for fracture with routine healing: Secondary | ICD-10-CM | POA: Diagnosis not present

## 2012-08-10 DIAGNOSIS — E119 Type 2 diabetes mellitus without complications: Secondary | ICD-10-CM | POA: Diagnosis not present

## 2012-08-10 DIAGNOSIS — IMO0001 Reserved for inherently not codable concepts without codable children: Secondary | ICD-10-CM | POA: Diagnosis not present

## 2012-08-10 DIAGNOSIS — I5032 Chronic diastolic (congestive) heart failure: Secondary | ICD-10-CM | POA: Diagnosis not present

## 2012-08-10 DIAGNOSIS — J441 Chronic obstructive pulmonary disease with (acute) exacerbation: Secondary | ICD-10-CM | POA: Diagnosis not present

## 2012-08-11 DIAGNOSIS — J441 Chronic obstructive pulmonary disease with (acute) exacerbation: Secondary | ICD-10-CM | POA: Diagnosis not present

## 2012-08-11 DIAGNOSIS — I5032 Chronic diastolic (congestive) heart failure: Secondary | ICD-10-CM | POA: Diagnosis not present

## 2012-08-11 DIAGNOSIS — E119 Type 2 diabetes mellitus without complications: Secondary | ICD-10-CM | POA: Diagnosis not present

## 2012-08-11 DIAGNOSIS — IMO0001 Reserved for inherently not codable concepts without codable children: Secondary | ICD-10-CM | POA: Diagnosis not present

## 2012-08-11 DIAGNOSIS — S42309D Unspecified fracture of shaft of humerus, unspecified arm, subsequent encounter for fracture with routine healing: Secondary | ICD-10-CM | POA: Diagnosis not present

## 2012-08-15 DIAGNOSIS — E119 Type 2 diabetes mellitus without complications: Secondary | ICD-10-CM | POA: Diagnosis not present

## 2012-08-15 DIAGNOSIS — IMO0001 Reserved for inherently not codable concepts without codable children: Secondary | ICD-10-CM | POA: Diagnosis not present

## 2012-08-15 DIAGNOSIS — J441 Chronic obstructive pulmonary disease with (acute) exacerbation: Secondary | ICD-10-CM | POA: Diagnosis not present

## 2012-08-15 DIAGNOSIS — S42309D Unspecified fracture of shaft of humerus, unspecified arm, subsequent encounter for fracture with routine healing: Secondary | ICD-10-CM | POA: Diagnosis not present

## 2012-08-15 DIAGNOSIS — I5032 Chronic diastolic (congestive) heart failure: Secondary | ICD-10-CM | POA: Diagnosis not present

## 2012-08-16 DIAGNOSIS — J441 Chronic obstructive pulmonary disease with (acute) exacerbation: Secondary | ICD-10-CM | POA: Diagnosis not present

## 2012-08-16 DIAGNOSIS — S42309D Unspecified fracture of shaft of humerus, unspecified arm, subsequent encounter for fracture with routine healing: Secondary | ICD-10-CM | POA: Diagnosis not present

## 2012-08-16 DIAGNOSIS — E119 Type 2 diabetes mellitus without complications: Secondary | ICD-10-CM | POA: Diagnosis not present

## 2012-08-16 DIAGNOSIS — I5032 Chronic diastolic (congestive) heart failure: Secondary | ICD-10-CM | POA: Diagnosis not present

## 2012-08-16 DIAGNOSIS — IMO0001 Reserved for inherently not codable concepts without codable children: Secondary | ICD-10-CM | POA: Diagnosis not present

## 2012-08-17 DIAGNOSIS — E119 Type 2 diabetes mellitus without complications: Secondary | ICD-10-CM | POA: Diagnosis not present

## 2012-08-17 DIAGNOSIS — J441 Chronic obstructive pulmonary disease with (acute) exacerbation: Secondary | ICD-10-CM | POA: Diagnosis not present

## 2012-08-17 DIAGNOSIS — S42309D Unspecified fracture of shaft of humerus, unspecified arm, subsequent encounter for fracture with routine healing: Secondary | ICD-10-CM | POA: Diagnosis not present

## 2012-08-17 DIAGNOSIS — IMO0001 Reserved for inherently not codable concepts without codable children: Secondary | ICD-10-CM | POA: Diagnosis not present

## 2012-08-17 DIAGNOSIS — I5032 Chronic diastolic (congestive) heart failure: Secondary | ICD-10-CM | POA: Diagnosis not present

## 2012-08-18 DIAGNOSIS — I5032 Chronic diastolic (congestive) heart failure: Secondary | ICD-10-CM | POA: Diagnosis not present

## 2012-08-18 DIAGNOSIS — S42309D Unspecified fracture of shaft of humerus, unspecified arm, subsequent encounter for fracture with routine healing: Secondary | ICD-10-CM | POA: Diagnosis not present

## 2012-08-18 DIAGNOSIS — J441 Chronic obstructive pulmonary disease with (acute) exacerbation: Secondary | ICD-10-CM | POA: Diagnosis not present

## 2012-08-18 DIAGNOSIS — IMO0001 Reserved for inherently not codable concepts without codable children: Secondary | ICD-10-CM | POA: Diagnosis not present

## 2012-08-18 DIAGNOSIS — E119 Type 2 diabetes mellitus without complications: Secondary | ICD-10-CM | POA: Diagnosis not present

## 2012-08-22 ENCOUNTER — Ambulatory Visit: Payer: Medicare Other | Admitting: Internal Medicine

## 2012-08-22 ENCOUNTER — Telehealth: Payer: Self-pay

## 2012-08-22 NOTE — Telephone Encounter (Signed)
Ok to extend OT as requested.  If the patient is now at home she will need an OV next week.

## 2012-08-22 NOTE — Telephone Encounter (Signed)
Caroleen Hamman called requesting verbal orders to extend OT for twice a week x 2 weeks. Please advise Thanks

## 2012-08-23 DIAGNOSIS — I5032 Chronic diastolic (congestive) heart failure: Secondary | ICD-10-CM | POA: Diagnosis not present

## 2012-08-23 DIAGNOSIS — E119 Type 2 diabetes mellitus without complications: Secondary | ICD-10-CM | POA: Diagnosis not present

## 2012-08-23 DIAGNOSIS — J441 Chronic obstructive pulmonary disease with (acute) exacerbation: Secondary | ICD-10-CM | POA: Diagnosis not present

## 2012-08-23 DIAGNOSIS — IMO0001 Reserved for inherently not codable concepts without codable children: Secondary | ICD-10-CM | POA: Diagnosis not present

## 2012-08-23 DIAGNOSIS — S42309D Unspecified fracture of shaft of humerus, unspecified arm, subsequent encounter for fracture with routine healing: Secondary | ICD-10-CM | POA: Diagnosis not present

## 2012-08-23 NOTE — Telephone Encounter (Signed)
LMOVM advising per MD. 

## 2012-08-25 DIAGNOSIS — J441 Chronic obstructive pulmonary disease with (acute) exacerbation: Secondary | ICD-10-CM | POA: Diagnosis not present

## 2012-08-25 DIAGNOSIS — IMO0001 Reserved for inherently not codable concepts without codable children: Secondary | ICD-10-CM | POA: Diagnosis not present

## 2012-08-25 DIAGNOSIS — I5032 Chronic diastolic (congestive) heart failure: Secondary | ICD-10-CM | POA: Diagnosis not present

## 2012-08-25 DIAGNOSIS — S42309D Unspecified fracture of shaft of humerus, unspecified arm, subsequent encounter for fracture with routine healing: Secondary | ICD-10-CM | POA: Diagnosis not present

## 2012-08-25 DIAGNOSIS — E119 Type 2 diabetes mellitus without complications: Secondary | ICD-10-CM | POA: Diagnosis not present

## 2012-08-30 DIAGNOSIS — I5032 Chronic diastolic (congestive) heart failure: Secondary | ICD-10-CM | POA: Diagnosis not present

## 2012-08-30 DIAGNOSIS — IMO0001 Reserved for inherently not codable concepts without codable children: Secondary | ICD-10-CM | POA: Diagnosis not present

## 2012-08-30 DIAGNOSIS — S42309D Unspecified fracture of shaft of humerus, unspecified arm, subsequent encounter for fracture with routine healing: Secondary | ICD-10-CM | POA: Diagnosis not present

## 2012-08-30 DIAGNOSIS — E119 Type 2 diabetes mellitus without complications: Secondary | ICD-10-CM | POA: Diagnosis not present

## 2012-08-30 DIAGNOSIS — J441 Chronic obstructive pulmonary disease with (acute) exacerbation: Secondary | ICD-10-CM | POA: Diagnosis not present

## 2012-09-01 DIAGNOSIS — IMO0001 Reserved for inherently not codable concepts without codable children: Secondary | ICD-10-CM | POA: Diagnosis not present

## 2012-09-01 DIAGNOSIS — S42309D Unspecified fracture of shaft of humerus, unspecified arm, subsequent encounter for fracture with routine healing: Secondary | ICD-10-CM | POA: Diagnosis not present

## 2012-09-01 DIAGNOSIS — I5032 Chronic diastolic (congestive) heart failure: Secondary | ICD-10-CM | POA: Diagnosis not present

## 2012-09-01 DIAGNOSIS — J441 Chronic obstructive pulmonary disease with (acute) exacerbation: Secondary | ICD-10-CM | POA: Diagnosis not present

## 2012-09-01 DIAGNOSIS — E119 Type 2 diabetes mellitus without complications: Secondary | ICD-10-CM | POA: Diagnosis not present

## 2012-09-08 ENCOUNTER — Ambulatory Visit (INDEPENDENT_AMBULATORY_CARE_PROVIDER_SITE_OTHER): Payer: Medicare Other | Admitting: Internal Medicine

## 2012-09-08 ENCOUNTER — Other Ambulatory Visit (INDEPENDENT_AMBULATORY_CARE_PROVIDER_SITE_OTHER): Payer: Medicare Other

## 2012-09-08 ENCOUNTER — Encounter: Payer: Self-pay | Admitting: Internal Medicine

## 2012-09-08 VITALS — BP 100/60 | HR 77 | Temp 96.9°F

## 2012-09-08 DIAGNOSIS — S72009D Fracture of unspecified part of neck of unspecified femur, subsequent encounter for closed fracture with routine healing: Secondary | ICD-10-CM

## 2012-09-08 DIAGNOSIS — E119 Type 2 diabetes mellitus without complications: Secondary | ICD-10-CM

## 2012-09-08 DIAGNOSIS — I2510 Atherosclerotic heart disease of native coronary artery without angina pectoris: Secondary | ICD-10-CM | POA: Diagnosis not present

## 2012-09-08 DIAGNOSIS — F17200 Nicotine dependence, unspecified, uncomplicated: Secondary | ICD-10-CM | POA: Diagnosis not present

## 2012-09-08 DIAGNOSIS — J449 Chronic obstructive pulmonary disease, unspecified: Secondary | ICD-10-CM

## 2012-09-08 DIAGNOSIS — S32402D Unspecified fracture of left acetabulum, subsequent encounter for fracture with routine healing: Secondary | ICD-10-CM

## 2012-09-08 DIAGNOSIS — I5032 Chronic diastolic (congestive) heart failure: Secondary | ICD-10-CM

## 2012-09-08 DIAGNOSIS — I509 Heart failure, unspecified: Secondary | ICD-10-CM

## 2012-09-08 DIAGNOSIS — G47419 Narcolepsy without cataplexy: Secondary | ICD-10-CM

## 2012-09-08 LAB — COMPREHENSIVE METABOLIC PANEL
Alkaline Phosphatase: 70 U/L (ref 39–117)
BUN: 20 mg/dL (ref 6–23)
CO2: 30 mEq/L (ref 19–32)
Creatinine, Ser: 1 mg/dL (ref 0.4–1.2)
GFR: 60.28 mL/min (ref >60.00)
Glucose, Bld: 107 mg/dL — ABNORMAL HIGH (ref 70–99)
Total Bilirubin: 0.6 mg/dL (ref 0.3–1.2)

## 2012-09-08 MED ORDER — METFORMIN HCL 500 MG PO TABS: 500.0000 mg | ORAL_TABLET | Freq: Two times a day (BID) | ORAL | Status: DC
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
## 2012-09-08 NOTE — Patient Instructions (Addendum)
Narcolepsy - please check with Dr. Vassie Loll as to how well controlled your sleep apnea is. If there are no problems there we can look into evaluation for narcolepsy or give a trial of provigil.  COPD - stable  A fib - need to continue on anticoagulation and eliquis is convenient but pricey  Heart failure - will continue the lowest possible dose of diuretics to keep your lungs clear but it is not use just for peripheral edema.  it is ok to substitute furosemide for torsemide: 10 mg torsemide is equivalent to 40 mg furosemide. Send me a MyChart message about your dose and the strength of the furosemide.   Diabetes  -  Lab Results  Component Value Date   HGBA1C 6.3* 05/14/2012   Will recheck and then determine the appropriate dose of metformin, i.e. 500 mg once or twice a day based on the lab results.   Medications - everything looks appropriate.

## 2012-09-11 DIAGNOSIS — G4710 Hypersomnia, unspecified: Secondary | ICD-10-CM | POA: Insufficient documentation

## 2012-09-11 NOTE — Assessment & Plan Note (Signed)
Currently doing well with good use of the left UE. Seems to have good ROM

## 2012-09-11 NOTE — Assessment & Plan Note (Signed)
Doing well. No complaint of chest pain or other cardiac symptoms
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
## 2012-09-11 NOTE — Assessment & Plan Note (Signed)
Lab Results  Component Value Date   HGBA1C 7.0* 09/08/2012   At goal for control on present regimen of diet and metformin  Plan - continue present regimen

## 2012-09-11 NOTE — Assessment & Plan Note (Signed)
Continues successful cessation!!

## 2012-09-11 NOTE — Assessment & Plan Note (Signed)
Recently seen in pulmonary - currently doing well.  Plan Continue smoking cessation  No change in medical regimen.

## 2012-09-11 NOTE — Assessment & Plan Note (Signed)
Complete rehab. Walking with full weight bearing with the use of a cane. Pain is minimal. She still follows with Dr. Carola Frost.

## 2012-09-15 DIAGNOSIS — Z950 Presence of cardiac pacemaker: Secondary | ICD-10-CM | POA: Diagnosis not present

## 2012-09-15 DIAGNOSIS — I495 Sick sinus syndrome: Secondary | ICD-10-CM | POA: Diagnosis not present

## 2012-09-19 ENCOUNTER — Ambulatory Visit (INDEPENDENT_AMBULATORY_CARE_PROVIDER_SITE_OTHER): Payer: Medicare Other | Admitting: Pulmonary Disease

## 2012-09-19 ENCOUNTER — Encounter: Payer: Self-pay | Admitting: Pulmonary Disease

## 2012-09-19 VITALS — BP 104/52 | HR 70 | Temp 97.3°F | Wt 165.8 lb

## 2012-09-19 DIAGNOSIS — G47419 Narcolepsy without cataplexy: Secondary | ICD-10-CM

## 2012-09-19 DIAGNOSIS — G4733 Obstructive sleep apnea (adult) (pediatric): Secondary | ICD-10-CM | POA: Diagnosis not present

## 2012-09-19 NOTE — Patient Instructions (Addendum)
We will ask DME to check your CPAP machine We will check your oxygen levels on CPAP & O2 We will repeat your sleep study to ensure that CPAP is working [employer]

## 2012-09-19 NOTE — Assessment & Plan Note (Signed)
I very much doubt the diagnosis of narcolepsy here. This question has been raised by her PCP given her prior episode of somnolence. A review of her sleep study in fact shows delay REM latency. There is no history suggestive of cataplexy and there is no sleep onset REM episodes noted on her sleep studies. No While we can undertake a trial of modafinil for somnolence that's persisted in spite of using CPAP, I am very hesitant to do this with her cardiac history of atrial fibrillation and diastolic dysfunction.

## 2012-09-19 NOTE — Assessment & Plan Note (Addendum)
Severe sleep apnea treated with nasal CPAP at 10 cm water pressure since 2009 ONO 2L plus Cpap 01/25/2012:  Normal, no desaturation  We will ask DME to check your CPAP machine We will check your oxygen levels on CPAP & O2 We will repeat CPAP titration study to ensure that CPAP is working OK  I again emphasize to her that her somnolence may vary on a day to day basis. I have advised her against driving in the meantime. Compliance with CPAP was again emphasized and she does seem to be sincere about this. Her medication list does not reveal any other cause of somnolence.
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
## 2012-09-26 ENCOUNTER — Telehealth: Payer: Self-pay | Admitting: *Deleted

## 2012-09-26 NOTE — Telephone Encounter (Signed)
Letter on your desk

## 2012-09-26 NOTE — Telephone Encounter (Signed)
Pt called requesting a letter authorizing her to take water aerobics.  Please advise

## 2012-09-27 NOTE — Telephone Encounter (Signed)
Notified pt letter ready for pick up

## 2012-10-03 ENCOUNTER — Telehealth: Payer: Self-pay | Admitting: Pulmonary Disease

## 2012-10-03 DIAGNOSIS — G4733 Obstructive sleep apnea (adult) (pediatric): Secondary | ICD-10-CM

## 2012-10-03 NOTE — Telephone Encounter (Signed)
I will forward to Dr. Vassie Loll so he is aware. Carron Curie, CMA

## 2012-10-12 NOTE — Telephone Encounter (Signed)
Download shows CPAP working OK - change to 12 cm pressure & download in 1 month

## 2012-10-13 ENCOUNTER — Telehealth: Payer: Self-pay | Admitting: Pulmonary Disease

## 2012-10-13 MED ORDER — ARMODAFINIL 150 MG PO TABS: ORAL_TABLET | ORAL | Status: DC

## 2012-10-13 NOTE — Telephone Encounter (Signed)
I spoke with pt and is aware of RA recs. Order has been placed.

## 2012-10-13 NOTE — Addendum Note (Signed)
Addended by: Tommie Sams on: 10/13/2012 10:33 AM   Modules accepted: Orders

## 2012-10-13 NOTE — Telephone Encounter (Signed)
150 mg -take 1/2 tab po q am & report back in 1 week Also call if palpitations, chest pain Schedule FU OV with me in 2-3 wks

## 2012-10-13 NOTE — Telephone Encounter (Signed)
I spoke with [name]. She stated she is willing to try a half of tablet of nuvigil. She wants this sent to [facility] and advise them to deliver this to her since she does not drive. Please advise what strength Dr. Vassie Loll thanks

## 2012-10-13 NOTE — Telephone Encounter (Signed)
DIscussed her issues with Dr Armanda Magic - d-in law. Sleepiness is an issue. We seem to all agree that she would not be able to drive. She apparently had an MSLT with Dr Earl Gala 2 -3 yrs ago that showed SOREMs but in absence of cataplexy, I would doubt a diagnosis of narcolepsy. More likely, she may have idiopathic hypersomnolence or persistent somnolence in spite of adequate CPAP use due to obstructive sleep apnea.  A trial of nuvigil may certainly be indicated if she is willing to improve her quality of life. Concern here would be dangerous cardiac side effects. She had an ablation for atrial fibrillation.  Please ask her if she is willing to try half tablet of Nuvigil for her persistent sleepiness.  [name] V.

## 2012-10-13 NOTE — Telephone Encounter (Signed)
I have spoken with the pharmacists and gave them the RX. I spoke with [name] and she stated she need to come in on mondays bc she does not have to pay for a cab. She scheduled appt 11/21/12 Monday at 1:30.

## 2012-10-14 ENCOUNTER — Telehealth: Payer: Self-pay | Admitting: Pulmonary Disease

## 2012-10-14 NOTE — Telephone Encounter (Signed)
No interactions Take at 9am OK to start before cpap adjustment

## 2012-10-14 NOTE — Telephone Encounter (Signed)
Spoke with patient-- Made patient aware of recs as listed below per Dr. Vassie Loll Patient verbalized understanding and nothing further needed at this time

## 2012-10-14 NOTE — Telephone Encounter (Signed)
Spoke with patient-- Patient to start Nuvigil today--150 mg -take 1/2 tab po q am  Patient wants to ensure there are no drug-drug interactions with Nuvigil and her other medications she is currently on Patient also states she is to have the pressure on her CPAP changed today at 430pm-- should she wait until tomorrow morning after the pressure on CPAP has been adjusted before starting Nuvigil? Is there a better time to take initial dose of Nuvigil?  Dr. Vassie Loll please advise, thank you  Last OV 7/14 Next OV 9/15  Current Outpatient Prescriptions on File Prior to Visit  Medication Sig Dispense Refill  . apixaban (ELIQUIS) 5 MG TABS tablet Take 1 tablet (5 mg total) by mouth 2 (two) times daily.  28 tablet  0  . Armodafinil (NUVIGIL) 150 MG tablet Take 1/2 tab every morning  30 tablet  0  . atenolol (TENORMIN) 25 MG tablet Take 25 mg by mouth daily.      Marland Kitchen atorvastatin (LIPITOR) 20 MG tablet Take 1 tablet (20 mg total) by mouth daily.  14 tablet  0  . cholecalciferol (VITAMIN D) 1000 UNITS tablet Take 1 tablet (1,000 Units total) by mouth daily.  100 tablet  3  . cilostazol (PLETAL) 100 MG tablet Take 100 mg by mouth at bedtime. And again after breakfast if remembers      . Cyanocobalamin (VITAMIN B-12) 500 MCG SUBL 1 tab sl qd  150 tablet  3  . ferrous sulfate 325 (65 FE) MG tablet Take 1 tablet (325 mg total) by mouth daily with breakfast.      . Fluticasone-Salmeterol (ADVAIR) 250-50 MCG/DOSE AEPB Inhale 1 puff into the lungs 2 (two) times daily.  1 each  6  . letrozole (FEMARA) 2.5 MG tablet Take 1 tablet (2.5 mg total) by mouth daily.  90 tablet  12  . levalbuterol (XOPENEX HFA) 45 MCG/ACT inhaler Inhale 2 puffs into the lungs every 6 (six) hours as needed. For shortness of breath      . magnesium oxide (MAG-OX) 400 (241.3 MG) MG tablet Take 0.5 tablets (200 mg total) by mouth daily.  30 tablet  11  . metFORMIN (GLUCOPHAGE) 500 MG tablet Take 1 tablet (500 mg total) by mouth 2 (two) times  daily with a meal.  60 tablet  6  . potassium chloride SA (K-DUR,KLOR-CON) 20 MEQ tablet 5 tablets daily      . tiotropium (SPIRIVA HANDIHALER) 18 MCG inhalation capsule Place 1 capsule (18 mcg total) into inhaler and inhale daily.  90 capsule  4  . torsemide (DEMADEX) 10 MG tablet Take 10 mg by mouth daily.       No current facility-administered medications on file prior to visit.

## 2012-10-24 NOTE — Telephone Encounter (Signed)
Returning call can be reached at [phone].[name]

## 2012-10-24 NOTE — Telephone Encounter (Signed)
I spoke with pt and is aware of recs. Nothing further needed 

## 2012-10-24 NOTE — Telephone Encounter (Signed)
OK to increase to 1 whole tab daily am

## 2012-10-24 NOTE — Telephone Encounter (Signed)
I spoke with pt and she stated she has not noticed in difference in taking the nuvigil. She even fell asleep last night at the table. She stated the nuvigil is hard to cut in half and is crumbling up when she does. A pill cutter does not even want to cut this per pt. She is wanting to know if she just needs to increase to 1 whole tablet since this is not helping. Please advise Dr. Vassie Loll thanks

## 2012-11-01 ENCOUNTER — Telehealth: Payer: Self-pay | Admitting: *Deleted

## 2012-11-01 ENCOUNTER — Other Ambulatory Visit: Payer: Self-pay | Admitting: *Deleted

## 2012-11-01 MED ORDER — MAGIC MOUTHWASH W/LIDOCAINE: 5.0000 mL | Freq: Four times a day (QID) | ORAL | Status: DC

## 2012-11-01 NOTE — Telephone Encounter (Signed)
Pt called requesting refill on Magic Mouthwash to be called into [facility].  Please advise

## 2012-11-01 NOTE — Telephone Encounter (Signed)
rx sent to pharmacy

## 2012-11-01 NOTE — Telephone Encounter (Signed)
Ok for refill x2 

## 2012-11-02 DIAGNOSIS — S42213A Unspecified displaced fracture of surgical neck of unspecified humerus, initial encounter for closed fracture: Secondary | ICD-10-CM | POA: Diagnosis not present

## 2012-11-02 DIAGNOSIS — S62319A Displaced fracture of base of unspecified metacarpal bone, initial encounter for closed fracture: Secondary | ICD-10-CM | POA: Diagnosis not present

## 2012-11-02 DIAGNOSIS — S32409A Unspecified fracture of unspecified acetabulum, initial encounter for closed fracture: Secondary | ICD-10-CM | POA: Diagnosis not present

## 2012-11-02 DIAGNOSIS — IMO0001 Reserved for inherently not codable concepts without codable children: Secondary | ICD-10-CM | POA: Diagnosis not present

## 2012-11-03 ENCOUNTER — Other Ambulatory Visit: Payer: Self-pay | Admitting: *Deleted

## 2012-11-03 ENCOUNTER — Telehealth: Payer: Self-pay | Admitting: Pulmonary Disease

## 2012-11-03 DIAGNOSIS — J449 Chronic obstructive pulmonary disease, unspecified: Secondary | ICD-10-CM

## 2012-11-03 MED ORDER — POTASSIUM CHLORIDE CRYS ER 20 MEQ PO TBCR: EXTENDED_RELEASE_TABLET | ORAL | Status: DC

## 2012-11-03 MED ORDER — TIOTROPIUM BROMIDE MONOHYDRATE 18 MCG IN CAPS: 18.0000 ug | ORAL_CAPSULE | Freq: Every day | RESPIRATORY_TRACT | Status: DC

## 2012-11-03 NOTE — Telephone Encounter (Signed)
I spoke with pt. She confirmed she needs refill on spiriva sent to [facility]. I have done so and nothing further needed

## 2012-11-14 ENCOUNTER — Telehealth: Payer: Self-pay | Admitting: Pulmonary Disease

## 2012-11-14 NOTE — Telephone Encounter (Signed)
cpap download 11/10/12 - good compliance on 12 cm No residual events CPAP working well  is nuvigil helping with sleepiness?

## 2012-11-15 NOTE — Telephone Encounter (Signed)
I spoke with patient about results and she verbalized understanding and had no questions. She stated the nuvigil has helped with the sleepiness. She stated she still feels tired at times but nothing like before. Will forward to Dr. Vassie Loll as an [place]ain Childes

## 2012-11-17 ENCOUNTER — Other Ambulatory Visit: Payer: Self-pay | Admitting: *Deleted

## 2012-11-17 ENCOUNTER — Telehealth: Payer: Self-pay | Admitting: Pulmonary Disease

## 2012-11-17 MED ORDER — TORSEMIDE 10 MG PO TABS: 10.0000 mg | ORAL_TABLET | Freq: Every day | ORAL | Status: DC

## 2012-11-17 MED ORDER — ARMODAFINIL 150 MG PO TABS: ORAL_TABLET | ORAL | Status: DC

## 2012-11-17 NOTE — Telephone Encounter (Signed)
OK - 1 tab daily

## 2012-11-17 NOTE — Telephone Encounter (Signed)
Pt increased to 1 tablet daily bc the 1/2 tablet was not helping her sleepiness. Is it okay to still send? thanks

## 2012-11-17 NOTE — Telephone Encounter (Signed)
RX printed and will have RA sign this and fax to [facility].

## 2012-11-17 NOTE — Telephone Encounter (Signed)
Pl send rx - confirm she is taking 1/2 tab

## 2012-11-17 NOTE — Telephone Encounter (Signed)
I spoke with pt. She stated she has 2 tablets of the nuvigil left. She stated she is not sure if Dr. Vassie Loll is wanting her to continue on this. If so she wants a 90 day supply sent to mail order. She has pending appt 11/21/12. Please advise RA thanks

## 2012-11-18 NOTE — Telephone Encounter (Signed)
RX has been faxed.

## 2012-11-21 ENCOUNTER — Encounter: Payer: Self-pay | Admitting: Pulmonary Disease

## 2012-11-21 ENCOUNTER — Ambulatory Visit (INDEPENDENT_AMBULATORY_CARE_PROVIDER_SITE_OTHER): Payer: Medicare Other | Admitting: Pulmonary Disease

## 2012-11-21 VITALS — BP 118/76 | HR 69 | Temp 97.9°F | Ht 65.0 in | Wt 173.6 lb

## 2012-11-21 DIAGNOSIS — G4710 Hypersomnia, unspecified: Secondary | ICD-10-CM | POA: Diagnosis not present

## 2012-11-21 DIAGNOSIS — G4733 Obstructive sleep apnea (adult) (pediatric): Secondary | ICD-10-CM | POA: Diagnosis not present

## 2012-11-21 NOTE — Patient Instructions (Addendum)
Rx for nuvigil will be sent Daytime sleep test will be scheduled - call me when ready Light exposure x 30 mins daytime CPAP 12 cm is working well 
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
## 2012-11-28 NOTE — Assessment & Plan Note (Signed)
She apparently had an MSLT with Dr Earl Gala 2 -3 yrs ago that showed SOREMs but in absence of cataplexy, I would doubt a diagnosis of narcolepsy. More likely, she may have idiopathic hypersomnolence or persistent somnolence in spite of adequate CPAP use due to obstructive sleep apnea  or delayed sleep phase.  Use Nuvigil Light exposure daytime x Doubt we can clear her to drive - she will get back to [name] to schedule MSLT

## 2012-11-28 NOTE — Assessment & Plan Note (Signed)
Rx for nuvigil will be sent Daytime sleep test will be scheduled - call me when ready Light exposure x 30 mins daytime CPAP 12 cm is working well

## 2012-12-08 ENCOUNTER — Encounter: Payer: Self-pay | Admitting: Internal Medicine

## 2012-12-08 ENCOUNTER — Ambulatory Visit (INDEPENDENT_AMBULATORY_CARE_PROVIDER_SITE_OTHER): Payer: Medicare Other | Admitting: Internal Medicine

## 2012-12-08 VITALS — BP 108/78 | HR 85 | Temp 97.0°F | Wt 168.8 lb

## 2012-12-08 DIAGNOSIS — J309 Allergic rhinitis, unspecified: Secondary | ICD-10-CM | POA: Diagnosis not present

## 2012-12-08 DIAGNOSIS — H1045 Other chronic allergic conjunctivitis: Secondary | ICD-10-CM

## 2012-12-08 DIAGNOSIS — J209 Acute bronchitis, unspecified: Secondary | ICD-10-CM

## 2012-12-08 DIAGNOSIS — Z23 Encounter for immunization: Secondary | ICD-10-CM

## 2012-12-08 DIAGNOSIS — M5431 Sciatica, right side: Secondary | ICD-10-CM

## 2012-12-08 DIAGNOSIS — M79604 Pain in right leg: Secondary | ICD-10-CM

## 2012-12-08 DIAGNOSIS — H1013 Acute atopic conjunctivitis, bilateral: Secondary | ICD-10-CM

## 2012-12-08 DIAGNOSIS — M79609 Pain in unspecified limb: Secondary | ICD-10-CM

## 2012-12-08 DIAGNOSIS — M5430 Sciatica, unspecified side: Secondary | ICD-10-CM

## 2012-12-08 MED ORDER — AZITHROMYCIN 250 MG PO TABS: ORAL_TABLET | ORAL | Status: DC

## 2012-12-08 MED ORDER — METHYLPREDNISOLONE ACETATE 80 MG/ML IJ SUSP
80.0000 mg | Freq: Once | INTRAMUSCULAR | Status: AC
  Administered 2012-12-08: 80 mg via INTRAMUSCULAR

## 2012-12-08 NOTE — Progress Notes (Signed)
Subjective:    Patient ID: [name], female    DOB: 12/11/1931, 79 y.o.   MRN: [id number]  HPI   Here to f/u, c/o mult random symptoms , wears dentures even at night, has ongoing mouth and tongue discomfort not resolved completley with magic mouthwash.  Has ongoing stress taking care of husband.  But main c/o bilat eye right > left red/swelling, itching after just back from visiting [place] area to visit family, did wear some of daughter;s makeup about the eyes but stopped x 2 days after onset eye symptoms, no pain, fever, vision change.  No prior hx of allergies but does have mild nasal rhinitis symtpoms with post nasal gtt and congestion, though for some reason she does not want to be suggested she has allergies. Has ? Low grade temp, mild prod cough.  Also c/o right back and leg pain not well controlled, worse in the past 1-2 mo , walks with cane, no weakness or falls. Past Medical History  Diagnosis Date  . CVA (cerebral vascular accident)   . Sleep apnea     associated with hypersomnia  . Amiodarone pulmonary toxicity   . Renal disease     CHRONIC  . Diabetes   . Tobacco abuse   . Diastolic heart failure     Acute on Chronic  . Pacemaker     Permanent  . AF (atrial fibrillation)      AV ablation 9/09 [facility] per Dr Sampson Goon - AV node ablation 9/11 Dr Graciela Husbands  . Hyperlipidemia   . GERD (gastroesophageal reflux disease)   . [name] (coronary artery disease)     (not sure of this 11/10  . COPD (chronic obstructive pulmonary disease)     emphysema -FeV1 73% DLCO 53% 5/09  . PAD (peripheral artery disease)     w/hx right iliac/SFA stenting and left leg PTA  . Invasive ductal carcinoma of breast 2011    LEFT   . OA (osteoarthritis) of knee     RIGHT  . Right sided sciatica   . Sinoatrial node dysfunction   . CHF (congestive heart failure)   . Mental disorder   . Acute blood loss anemia 04/20/2012  . Depression 06/06/2012   Past Surgical History  Procedure Laterality Date  .  Mastectomy, partial  02/03/2010    Left/Dr Rosenbower  . Cataract extraction, bilateral      with IOL/Dr Groat  . Breast lumpectomy      left breast  . Orif acetabular fracture Left 04/19/2012    Procedure: OPEN REDUCTION INTERNAL FIXATION (ORIF) ACETABULAR FRACTURE;  Surgeon: Budd Palmer, MD;  Location: [facility] OR;  Service: Orthopedics;  Laterality: Left;    reports that she quit smoking about a year ago. Her smoking use included Cigarettes. She has a 55 pack-year smoking history. She has never used smokeless tobacco. She reports that  drinks alcohol. She reports that she does not use illicit drugs. family history includes Heart disease in her father. Allergies  Allergen Reactions  . Cholestatin     RAGWEED SEASON   Current Outpatient Prescriptions on File Prior to Visit  Medication Sig Dispense Refill  . Alum & Mag Hydroxide-Simeth (MAGIC MOUTHWASH W/LIDOCAINE) SOLN Take 5 mLs by mouth 4 (four) times daily. Swish gargle and spit  250 mL  2  . apixaban (ELIQUIS) 5 MG TABS tablet Take 1 tablet (5 mg total) by mouth 2 (two) times daily.  28 tablet  0  . Armodafinil (NUVIGIL) 150 MG  tablet Take 1 tab every morning  90 tablet  0  . atenolol (TENORMIN) 25 MG tablet Take 25 mg by mouth daily.      Marland Kitchen atorvastatin (LIPITOR) 20 MG tablet Take 1 tablet (20 mg total) by mouth daily.  14 tablet  0  . cholecalciferol (VITAMIN D) 1000 UNITS tablet Take 1 tablet (1,000 Units total) by mouth daily.  100 tablet  3  . cilostazol (PLETAL) 100 MG tablet Take 100 mg by mouth at bedtime. And again after breakfast if remembers      . Cyanocobalamin (VITAMIN B-12) 500 MCG SUBL 1 tab sl qd  150 tablet  3  . ferrous sulfate 325 (65 FE) MG tablet Take 1 tablet (325 mg total) by mouth daily with breakfast.      . Fluticasone-Salmeterol (ADVAIR) 250-50 MCG/DOSE AEPB Inhale 1 puff into the lungs 2 (two) times daily.  1 each  6  . letrozole (FEMARA) 2.5 MG tablet Take 1 tablet (2.5 mg total) by mouth daily.  90 tablet   12  . levalbuterol (XOPENEX HFA) 45 MCG/ACT inhaler Inhale 2 puffs into the lungs every 6 (six) hours as needed. For shortness of breath      . magnesium oxide (MAG-OX) 400 (241.3 MG) MG tablet Take 0.5 tablets (200 mg total) by mouth daily.  30 tablet  11  . metFORMIN (GLUCOPHAGE) 500 MG tablet Take 1 tablet (500 mg total) by mouth 2 (two) times daily with a meal.  60 tablet  6  . potassium chloride SA (K-DUR,KLOR-CON) 20 MEQ tablet 5 tablets daily. Take 2 in the morning and 3 in the evening.  450 tablet  0  . tiotropium (SPIRIVA HANDIHALER) 18 MCG inhalation capsule Place 1 capsule (18 mcg total) into inhaler and inhale daily.  90 capsule  4  . torsemide (DEMADEX) 10 MG tablet Take 1 tablet (10 mg total) by mouth daily.  90 tablet  3   No current facility-administered medications on file prior to visit.   Review of Systems  Constitutional: Negative for unexpected weight change, or unusual diaphoresis  HENT: Negative for tinnitus.   Eyes: Negative for photophobia and visual disturbance.  Respiratory: Negative for choking and stridor.   Gastrointestinal: Negative for vomiting and blood in stool.  Genitourinary: Negative for hematuria and decreased urine volume.  Musculoskeletal: Negative for acute joint swelling Skin: Negative for color change and wound.  Neurological: Negative for tremors and numbness other than noted  Psychiatric/Behavioral: Negative for decreased concentration or  hyperactivity.       Objective:   Physical Exam BP 108/78  Pulse 85  Temp(Src) 97 F (36.1 C) (Oral)  Wt 168 lb 12 oz (76.544 kg)  BMI 28.08 kg/m2  SpO2 91% VS noted,  Constitutional: Pt appears well-developed and well-nourished.  HENT: Head: NCAT.  Right Ear: External ear normal.  Left Ear: External ear normal.  Eyes: Conjunctivae and EOM are normal but with bilat upper and lower lid mild swelling/nontender erythema. Pupils are equal, round, and reactive to light.  Neck: Normal range of motion.  Neck supple.  Bilat tm's with slight erythema.  Max sinus areas no tender.  Pharynx with mild erythema, no exudate Cardiovascular: Normal rate and regular rhythm.   Pulmonary/Chest: Effort normal and breath sounds normal.  Abd:  Soft, NT, non-distended, + BS Neurological: Pt is alert. Not confused , motor 5/5 Spine nontender Skin: Skin is warm. No erythema.  Psychiatric: Pt behavior is normal. Thought content normal.  Assessment & Plan:

## 2012-12-08 NOTE — Assessment & Plan Note (Addendum)
For otc allegra prn, with prob allergic conjunctivits vs contact dermatitis today - for depomedrol IM today

## 2012-12-08 NOTE — Assessment & Plan Note (Signed)
Vs cough related to post nasal gtt, for zpack trial

## 2012-12-08 NOTE — Assessment & Plan Note (Signed)
Pt requests eval per Dr Smith/sports med , will refer

## 2012-12-08 NOTE — Patient Instructions (Addendum)
You had the flu shot today, and the steroid shot for the redness about the eyes  Please continue all other medications as before  Please have the pharmacy call with any other refills you may need.  Please also consider OTC Zaditor (zyrtec for the eye) to help the eyes as well  You will be contacted regarding the referral for: Dr Smith/sports medicine in our office

## 2012-12-09 ENCOUNTER — Encounter: Payer: Self-pay | Admitting: Internal Medicine

## 2012-12-12 ENCOUNTER — Encounter: Payer: Medicare Other | Admitting: Internal Medicine

## 2012-12-13 ENCOUNTER — Encounter: Payer: Self-pay | Admitting: Internal Medicine

## 2012-12-13 ENCOUNTER — Ambulatory Visit (INDEPENDENT_AMBULATORY_CARE_PROVIDER_SITE_OTHER): Payer: Medicare Other | Admitting: Internal Medicine

## 2012-12-13 VITALS — BP 106/58 | HR 77 | Temp 97.5°F | Wt 168.4 lb

## 2012-12-13 DIAGNOSIS — H109 Unspecified conjunctivitis: Secondary | ICD-10-CM | POA: Insufficient documentation

## 2012-12-13 DIAGNOSIS — A499 Bacterial infection, unspecified: Secondary | ICD-10-CM

## 2012-12-13 DIAGNOSIS — J449 Chronic obstructive pulmonary disease, unspecified: Secondary | ICD-10-CM

## 2012-12-13 DIAGNOSIS — J209 Acute bronchitis, unspecified: Secondary | ICD-10-CM | POA: Diagnosis not present

## 2012-12-13 DIAGNOSIS — F329 Major depressive disorder, single episode, unspecified: Secondary | ICD-10-CM | POA: Diagnosis not present

## 2012-12-13 DIAGNOSIS — H1089 Other conjunctivitis: Secondary | ICD-10-CM

## 2012-12-13 NOTE — Assessment & Plan Note (Signed)
Sulfacetamide opthalmic drops-- 1 to 2 drops every 2 to 3 hours; taper by increasing dosage time interval as condition responds for 7-10 days.

## 2012-12-13 NOTE — Patient Instructions (Signed)
Bronchitis - concerned for you having a difficult to treat infection due to COPD. Plan Continue all your current pulmonary medications  Septra DS bid x 7  Conjunctivitis - sounds bacterial in nature. Plan Sulfacetamide drops as directed.
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
## 2012-12-14 ENCOUNTER — Other Ambulatory Visit: Payer: Self-pay

## 2012-12-14 ENCOUNTER — Other Ambulatory Visit: Payer: Self-pay | Admitting: Internal Medicine

## 2012-12-14 DIAGNOSIS — Z9889 Other specified postprocedural states: Secondary | ICD-10-CM

## 2012-12-14 DIAGNOSIS — Z853 Personal history of malignant neoplasm of breast: Secondary | ICD-10-CM

## 2012-12-14 MED ORDER — SULFACETAMIDE SODIUM 10 % OP SOLN: 1.0000 [drp] | OPHTHALMIC | Status: DC

## 2012-12-14 MED ORDER — SULFAMETHOXAZOLE-TMP DS 800-160 MG PO TABS: 1.0000 | ORAL_TABLET | Freq: Two times a day (BID) | ORAL | Status: DC

## 2012-12-14 NOTE — Assessment & Plan Note (Signed)
Patient was seen 12/08/12 and diagnosed with acute bronchitis--presented with cough productive of green-yellow sputum with no pulmonary exam findings. Seen on 12/12/12 with continued symptoms of acute bronchitis as well as bacterial conjunctivitis. At this point on day 5/6 of azithromycin with no improvement in symptoms. Lungs are CTAB with some transmitted upper airway sounds. Given that this is a patient with COPD and a history of frequent exacerbations, will initiate broader antibiotic coverage to reduce risk of secondary bacterial infections or pneumonia.  Plan Finish last day of azithromycin and start TMP-SMX 800-160 mg daily for 7 days.

## 2012-12-15 ENCOUNTER — Ambulatory Visit (INDEPENDENT_AMBULATORY_CARE_PROVIDER_SITE_OTHER): Payer: Medicare Other | Admitting: *Deleted

## 2012-12-15 DIAGNOSIS — I4891 Unspecified atrial fibrillation: Secondary | ICD-10-CM | POA: Diagnosis not present

## 2012-12-15 LAB — PACEMAKER DEVICE OBSERVATION
AL THRESHOLD: 1 V
BAMS-0003: 70 {beats}/min
RV LEAD THRESHOLD: 0.375 V

## 2012-12-15 NOTE — Progress Notes (Signed)
Pacemaker check in clinic. Normal device function. Thresholds, sensing, impedances consistent with previous measurements. Device programmed to maximize longevity. 56% A-fib, + eliquis.  No high ventricular rates noted. Device programmed at appropriate safety margins. Histogram distribution appropriate for patient activity level. Device programmed to optimize intrinsic conduction. Estimated longevity 1.75 years. Patient enrolled in remote follow-up/TTM's with [facility]. Plan to follow every 3 months remotely and see annually in office. Patient education completed.  ROV 6 months with Dr. Graciela Husbands.

## 2012-12-16 ENCOUNTER — Telehealth: Payer: Self-pay | Admitting: *Deleted

## 2012-12-16 NOTE — Telephone Encounter (Signed)
Called pt x 2//no answer, on second call, phone rang and was D/c

## 2012-12-16 NOTE — Telephone Encounter (Signed)
D/c bactrim. If symptoms have continued to improve - no additional antibiotics  If symptoms are active - doxycycline 100 mg bid x 5 days.

## 2012-12-16 NOTE — Telephone Encounter (Signed)
Pt called states while taking Bactrim DS she has developed a rash and itching.  Please advise

## 2012-12-17 NOTE — Telephone Encounter (Signed)
Called pt she states she had call on call md last night and was given another antibiotic & eye drops. Dr. Clent Ridges inform he received msg from call nurse last night abt this pt. Was told to d/c bactrim and he rx ceftin & neomycin drops. Inform pt to continue taking md that Dr. Clent Ridges called in last night...lmb

## 2012-12-19 ENCOUNTER — Ambulatory Visit: Payer: Medicare Other | Admitting: Family Medicine

## 2012-12-19 ENCOUNTER — Telehealth: Payer: Self-pay | Admitting: *Deleted

## 2012-12-19 NOTE — Telephone Encounter (Signed)
See phone notes 10/10 and 10/11

## 2012-12-19 NOTE — Telephone Encounter (Signed)
[facility] Triage Call Report Triage Record Num: [id number] Operator: Ether Griffins Patient Name: [name] Call Date & Time: 12/16/2012 6:59:32PM Patient Phone: [phone] PCP: Illene Regulus Patient Gender: Female PCP Fax : [phone] Patient DOB: Apr 05, 1931 [facility] Name: [facility] Reason for Call: [name]: [name]/Patient; PCP: Illene Regulus (Adults only); CB#: [phone]; Calling about arms have red spots under the skin and palms are itchy. Onset 12/16/12. Seen at office on 12/08/12 for green sputum,lung problems,red around eyes--was given shot of Predisone,flu shot and script for Azithromycin,eye drops. Went back because sxs weren't improving and saw Dr Debby Bud on 12/14/12--was given Sulfacetamide opth soln and Bactrim DS. Now has spots under skin on arms and palms are itchy. Guideline: Allergic Reaction,Severe. Disposition: See Provider Within 24 Hours. Override Disposition: Call Provider Immediately per nursing judgement. Reason for Disposition: Persistent or recurrent symptoms that do not respond to treatment. Contacted Dr Fry--he advised to stop taking current meds, take Benadryl and ordered Cortisporin opth gtts,take 2 gtts Q 4 hrs as needed(disp 10 ml bottle)no refills, and Cefuroxime 500 mg BID x 10 days(no refills). Called [facility],[address],[place] [phone] and left message on voicemail. Protocol(s) Used: Allergic Reaction, Severe Recommended Outcome per Protocol: See Provider within 24 hours Reason for Outcome: Persistent or recurrent symptoms that do not respond to treatment Care Advice: ~ Call provider if symptoms worsen or new symptoms develop. ~ SYMPTOM / CONDITION MANAGEMENT ~ CAUTIONS If an allergy is identified, tell all healthcare providers of your allergy. Even if a first-time reaction caused mild symptoms, a future response to the same allergen may cause more serious symptoms. Wear medical identification to alert others in case of  an emergency. ~ Nonprescription oral antihistamines (such as Zyrtec, Claritin, Allegra, Benadryl, Allerest, Chlor-Trimetron, etc.) may help relieve symptoms. - Use as directed on package label or by pharmacist. - Antihistamine medication may cause drowsiness and should be taken with caution by adults 75 years or older. Consider a non-sedating antihistamine (Zyrtec, Claritin, or Allegra) available without a prescription. ~ 10

## 2012-12-23 DIAGNOSIS — E119 Type 2 diabetes mellitus without complications: Secondary | ICD-10-CM | POA: Diagnosis not present

## 2012-12-23 DIAGNOSIS — Z961 Presence of intraocular lens: Secondary | ICD-10-CM | POA: Diagnosis not present

## 2012-12-23 DIAGNOSIS — H10029 Other mucopurulent conjunctivitis, unspecified eye: Secondary | ICD-10-CM | POA: Diagnosis not present

## 2012-12-23 DIAGNOSIS — H26499 Other secondary cataract, unspecified eye: Secondary | ICD-10-CM | POA: Diagnosis not present

## 2012-12-23 DIAGNOSIS — H02839 Dermatochalasis of unspecified eye, unspecified eyelid: Secondary | ICD-10-CM | POA: Diagnosis not present

## 2012-12-27 ENCOUNTER — Ambulatory Visit
Admission: RE | Admit: 2012-12-27 | Discharge: 2012-12-27 | Disposition: A | Discharge status: Home / Self Care | Payer: Medicare Other | Source: Ambulatory Visit | Attending: Internal Medicine | Admitting: Internal Medicine

## 2012-12-27 DIAGNOSIS — Z853 Personal history of malignant neoplasm of breast: Secondary | ICD-10-CM

## 2012-12-27 DIAGNOSIS — Z9889 Other specified postprocedural states: Secondary | ICD-10-CM

## 2013-01-02 ENCOUNTER — Encounter: Payer: Self-pay | Admitting: Internal Medicine

## 2013-01-04 DIAGNOSIS — H10029 Other mucopurulent conjunctivitis, unspecified eye: Secondary | ICD-10-CM | POA: Diagnosis not present

## 2013-01-04 DIAGNOSIS — H1045 Other chronic allergic conjunctivitis: Secondary | ICD-10-CM | POA: Diagnosis not present

## 2013-01-04 DIAGNOSIS — H0230 Blepharochalasis unspecified eye, unspecified eyelid: Secondary | ICD-10-CM | POA: Diagnosis not present

## 2013-01-10 ENCOUNTER — Ambulatory Visit (INDEPENDENT_AMBULATORY_CARE_PROVIDER_SITE_OTHER): Payer: Medicare Other | Admitting: Family Medicine

## 2013-01-10 ENCOUNTER — Encounter: Payer: Self-pay | Admitting: Family Medicine

## 2013-01-10 VITALS — BP 126/82 | HR 82 | Ht 65.0 in | Wt 169.2 lb

## 2013-01-10 DIAGNOSIS — M1710 Unilateral primary osteoarthritis, unspecified knee: Secondary | ICD-10-CM | POA: Diagnosis not present

## 2013-01-10 DIAGNOSIS — IMO0002 Reserved for concepts with insufficient information to code with codable children: Secondary | ICD-10-CM | POA: Diagnosis not present

## 2013-01-10 DIAGNOSIS — M7050 Other bursitis of knee, unspecified knee: Secondary | ICD-10-CM | POA: Insufficient documentation

## 2013-01-10 DIAGNOSIS — M170 Bilateral primary osteoarthritis of knee: Secondary | ICD-10-CM | POA: Insufficient documentation

## 2013-01-10 NOTE — Patient Instructions (Signed)
Very nice to meet you Try voltaren gel topically two times a day.  Do exercises daily and show your trainer. Pes anserine bursitis.  Take tylenol 650 mg three times a day is the best evidence based medicine we have for arthritis.  Glucosamine sulfate 750mg  twice a day is a supplement that has been shown to help moderate to severe arthritis. Fish oil 3 grams daily.  Vitamin D 1000-2000 IU daily Tumeric 500mg  daily for inflammation.  Capsaicin topically up to four times a day may also help with pain. Cortisone injections are an option if these interventions do not seem to make a difference or need more relief.  It's important that you continue to stay active. Consider physical therapy to strengthen muscles around the joint that hurts to take pressure off of the joint itself. Shoe inserts with good arch support may be helpful. You are doing good with your shoes.  Walker or cane if needed. Heat or ice 20 minutes at a time 3-4 times a day as needed to help with pain. Water aerobics and cycling with low resistance are the best two types of exercise for arthritis. Come back and see me in 4 weeks.

## 2013-01-10 NOTE — Assessment & Plan Note (Addendum)
Patient's underlying problem is likely the severe osteoarthritis of both of her knees. I do believe patient is likely endstage arthritis without be seen in the further imaging. The patient likely would be a candidate for potential knee replacement but does have other comorbidities. Patient would like to avoid surgery if at all possible. Patient given other medications over-the-counter that can be beneficial I would nontender at with her medications. Patient was given a home exercise program which can be beneficial as well. Discuss continuing to use the aid of a cane Discussed how muscle strengthening would be most beneficial for her. Patient will come back again in 4 weeks per
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
## 2013-01-10 NOTE — Progress Notes (Signed)
Pre-visit discussion using our clinic review tool. No additional management support is needed unless otherwise documented below in the visit note.  

## 2013-01-10 NOTE — Assessment & Plan Note (Signed)
Right-sided. Patient is very tender in this area and was given the potential for having a injection. Patient declined at this time. Patient was given a handout with home exercises that she can go over with her trainer. Patient will try these interventions and then come back and see me again in 4 weeks. If she continues have pain at that time I would do an injection in this area for symptomatic relief.

## 2013-01-13 ENCOUNTER — Ambulatory Visit (INDEPENDENT_AMBULATORY_CARE_PROVIDER_SITE_OTHER): Payer: Medicare Other | Admitting: Cardiovascular Disease

## 2013-01-13 ENCOUNTER — Encounter: Payer: Self-pay | Admitting: Cardiovascular Disease

## 2013-01-13 VITALS — BP 108/72 | HR 71 | Ht 65.0 in | Wt 172.0 lb

## 2013-01-13 DIAGNOSIS — I4891 Unspecified atrial fibrillation: Secondary | ICD-10-CM | POA: Diagnosis not present

## 2013-01-13 DIAGNOSIS — I2510 Atherosclerotic heart disease of native coronary artery without angina pectoris: Secondary | ICD-10-CM

## 2013-01-13 DIAGNOSIS — R0989 Other specified symptoms and signs involving the circulatory and respiratory systems: Secondary | ICD-10-CM | POA: Diagnosis not present

## 2013-01-13 NOTE — Patient Instructions (Signed)
Your physician has requested that you have a carotid duplex. This test is an ultrasound of the carotid arteries in your neck. It looks at blood flow through these arteries that supply the brain with blood. Allow one hour for this exam. There are no restrictions or special instructions.  Your physician wants you to follow-up in: 1 YEAR with Dr Cooper.  You will receive a reminder letter in the mail two months in advance. If you don't receive a letter, please call our office to schedule the follow-up appointment.  Your physician recommends that you continue on your current medications as directed. Please refer to the Current Medication list given to you today.   
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
## 2013-01-14 ENCOUNTER — Other Ambulatory Visit: Payer: Self-pay | Admitting: Internal Medicine

## 2013-01-20 ENCOUNTER — Encounter: Payer: Self-pay | Admitting: Internal Medicine

## 2013-01-20 ENCOUNTER — Ambulatory Visit (HOSPITAL_COMMUNITY): Payer: Medicare Other | Attending: Cardiovascular Disease

## 2013-01-20 DIAGNOSIS — I658 Occlusion and stenosis of other precerebral arteries: Secondary | ICD-10-CM | POA: Insufficient documentation

## 2013-01-20 DIAGNOSIS — R0989 Other specified symptoms and signs involving the circulatory and respiratory systems: Secondary | ICD-10-CM | POA: Diagnosis not present

## 2013-01-20 DIAGNOSIS — E785 Hyperlipidemia, unspecified: Secondary | ICD-10-CM | POA: Diagnosis not present

## 2013-01-20 DIAGNOSIS — F17200 Nicotine dependence, unspecified, uncomplicated: Secondary | ICD-10-CM | POA: Insufficient documentation

## 2013-01-20 DIAGNOSIS — J449 Chronic obstructive pulmonary disease, unspecified: Secondary | ICD-10-CM | POA: Diagnosis not present

## 2013-01-20 DIAGNOSIS — I10 Essential (primary) hypertension: Secondary | ICD-10-CM | POA: Diagnosis not present

## 2013-01-20 DIAGNOSIS — Z8673 Personal history of transient ischemic attack (TIA), and cerebral infarction without residual deficits: Secondary | ICD-10-CM | POA: Insufficient documentation

## 2013-01-20 DIAGNOSIS — J4489 Other specified chronic obstructive pulmonary disease: Secondary | ICD-10-CM | POA: Insufficient documentation

## 2013-01-20 DIAGNOSIS — E119 Type 2 diabetes mellitus without complications: Secondary | ICD-10-CM | POA: Diagnosis not present

## 2013-01-20 DIAGNOSIS — I2510 Atherosclerotic heart disease of native coronary artery without angina pectoris: Secondary | ICD-10-CM | POA: Diagnosis not present

## 2013-01-20 DIAGNOSIS — R42 Dizziness and giddiness: Secondary | ICD-10-CM | POA: Insufficient documentation

## 2013-01-20 DIAGNOSIS — I6529 Occlusion and stenosis of unspecified carotid artery: Secondary | ICD-10-CM

## 2013-01-20 DIAGNOSIS — I739 Peripheral vascular disease, unspecified: Secondary | ICD-10-CM | POA: Diagnosis not present

## 2013-01-25 DIAGNOSIS — L819 Disorder of pigmentation, unspecified: Secondary | ICD-10-CM | POA: Diagnosis not present

## 2013-01-25 DIAGNOSIS — L820 Inflamed seborrheic keratosis: Secondary | ICD-10-CM | POA: Diagnosis not present

## 2013-01-25 DIAGNOSIS — D1801 Hemangioma of skin and subcutaneous tissue: Secondary | ICD-10-CM | POA: Diagnosis not present

## 2013-01-25 DIAGNOSIS — D239 Other benign neoplasm of skin, unspecified: Secondary | ICD-10-CM | POA: Diagnosis not present

## 2013-01-25 DIAGNOSIS — Z85828 Personal history of other malignant neoplasm of skin: Secondary | ICD-10-CM | POA: Diagnosis not present

## 2013-01-25 DIAGNOSIS — L821 Other seborrheic keratosis: Secondary | ICD-10-CM | POA: Diagnosis not present

## 2013-01-25 DIAGNOSIS — L723 Sebaceous cyst: Secondary | ICD-10-CM | POA: Diagnosis not present

## 2013-01-30 ENCOUNTER — Other Ambulatory Visit: Payer: Self-pay

## 2013-01-30 ENCOUNTER — Encounter (HOSPITAL_COMMUNITY): Payer: Self-pay | Admitting: Emergency Medicine

## 2013-01-30 ENCOUNTER — Emergency Department (HOSPITAL_COMMUNITY): Payer: Medicare Other

## 2013-01-30 ENCOUNTER — Emergency Department (HOSPITAL_COMMUNITY)
Admission: EM | Admit: 2013-01-30 | Discharge: 2013-01-31 | Disposition: A | Discharge status: Home / Self Care | Payer: Medicare Other | Attending: Emergency Medicine | Admitting: Emergency Medicine

## 2013-01-30 DIAGNOSIS — S62319A Displaced fracture of base of unspecified metacarpal bone, initial encounter for closed fracture: Secondary | ICD-10-CM | POA: Diagnosis not present

## 2013-01-30 DIAGNOSIS — S8000XA Contusion of unspecified knee, initial encounter: Secondary | ICD-10-CM | POA: Insufficient documentation

## 2013-01-30 DIAGNOSIS — IMO0002 Reserved for concepts with insufficient information to code with codable children: Secondary | ICD-10-CM | POA: Insufficient documentation

## 2013-01-30 DIAGNOSIS — I5033 Acute on chronic diastolic (congestive) heart failure: Secondary | ICD-10-CM | POA: Diagnosis not present

## 2013-01-30 DIAGNOSIS — E119 Type 2 diabetes mellitus without complications: Secondary | ICD-10-CM | POA: Diagnosis not present

## 2013-01-30 DIAGNOSIS — M7989 Other specified soft tissue disorders: Secondary | ICD-10-CM | POA: Diagnosis not present

## 2013-01-30 DIAGNOSIS — S0083XA Contusion of other part of head, initial encounter: Secondary | ICD-10-CM

## 2013-01-30 DIAGNOSIS — Z8673 Personal history of transient ischemic attack (TIA), and cerebral infarction without residual deficits: Secondary | ICD-10-CM | POA: Insufficient documentation

## 2013-01-30 DIAGNOSIS — I2510 Atherosclerotic heart disease of native coronary artery without angina pectoris: Secondary | ICD-10-CM | POA: Insufficient documentation

## 2013-01-30 DIAGNOSIS — R51 Headache: Secondary | ICD-10-CM | POA: Diagnosis not present

## 2013-01-30 DIAGNOSIS — J449 Chronic obstructive pulmonary disease, unspecified: Secondary | ICD-10-CM | POA: Insufficient documentation

## 2013-01-30 DIAGNOSIS — F3289 Other specified depressive episodes: Secondary | ICD-10-CM | POA: Insufficient documentation

## 2013-01-30 DIAGNOSIS — N189 Chronic kidney disease, unspecified: Secondary | ICD-10-CM | POA: Diagnosis not present

## 2013-01-30 DIAGNOSIS — D62 Acute posthemorrhagic anemia: Secondary | ICD-10-CM | POA: Insufficient documentation

## 2013-01-30 DIAGNOSIS — Z8719 Personal history of other diseases of the digestive system: Secondary | ICD-10-CM | POA: Diagnosis not present

## 2013-01-30 DIAGNOSIS — S0003XA Contusion of scalp, initial encounter: Secondary | ICD-10-CM | POA: Diagnosis not present

## 2013-01-30 DIAGNOSIS — W010XXA Fall on same level from slipping, tripping and stumbling without subsequent striking against object, initial encounter: Secondary | ICD-10-CM | POA: Insufficient documentation

## 2013-01-30 DIAGNOSIS — Z87891 Personal history of nicotine dependence: Secondary | ICD-10-CM | POA: Insufficient documentation

## 2013-01-30 DIAGNOSIS — F329 Major depressive disorder, single episode, unspecified: Secondary | ICD-10-CM | POA: Insufficient documentation

## 2013-01-30 DIAGNOSIS — S8002XA Contusion of left knee, initial encounter: Secondary | ICD-10-CM

## 2013-01-30 DIAGNOSIS — S0990XA Unspecified injury of head, initial encounter: Secondary | ICD-10-CM | POA: Diagnosis not present

## 2013-01-30 DIAGNOSIS — G4730 Sleep apnea, unspecified: Secondary | ICD-10-CM | POA: Insufficient documentation

## 2013-01-30 DIAGNOSIS — Z950 Presence of cardiac pacemaker: Secondary | ICD-10-CM | POA: Insufficient documentation

## 2013-01-30 DIAGNOSIS — E785 Hyperlipidemia, unspecified: Secondary | ICD-10-CM | POA: Insufficient documentation

## 2013-01-30 DIAGNOSIS — S8990XA Unspecified injury of unspecified lower leg, initial encounter: Secondary | ICD-10-CM | POA: Diagnosis not present

## 2013-01-30 DIAGNOSIS — S32409A Unspecified fracture of unspecified acetabulum, initial encounter for closed fracture: Secondary | ICD-10-CM | POA: Diagnosis not present

## 2013-01-30 DIAGNOSIS — Z79899 Other long term (current) drug therapy: Secondary | ICD-10-CM | POA: Insufficient documentation

## 2013-01-30 DIAGNOSIS — J4489 Other specified chronic obstructive pulmonary disease: Secondary | ICD-10-CM | POA: Insufficient documentation

## 2013-01-30 DIAGNOSIS — W19XXXA Unspecified fall, initial encounter: Secondary | ICD-10-CM

## 2013-01-30 DIAGNOSIS — IMO0001 Reserved for inherently not codable concepts without codable children: Secondary | ICD-10-CM | POA: Diagnosis not present

## 2013-01-30 DIAGNOSIS — Y92009 Unspecified place in unspecified non-institutional (private) residence as the place of occurrence of the external cause: Secondary | ICD-10-CM | POA: Insufficient documentation

## 2013-01-30 DIAGNOSIS — I4891 Unspecified atrial fibrillation: Secondary | ICD-10-CM | POA: Insufficient documentation

## 2013-01-30 DIAGNOSIS — S42213A Unspecified displaced fracture of surgical neck of unspecified humerus, initial encounter for closed fracture: Secondary | ICD-10-CM | POA: Diagnosis not present

## 2013-01-30 DIAGNOSIS — M1710 Unilateral primary osteoarthritis, unspecified knee: Secondary | ICD-10-CM | POA: Insufficient documentation

## 2013-01-30 DIAGNOSIS — Y939 Activity, unspecified: Secondary | ICD-10-CM | POA: Insufficient documentation

## 2013-01-30 DIAGNOSIS — M25569 Pain in unspecified knee: Secondary | ICD-10-CM | POA: Diagnosis not present

## 2013-01-30 MED ORDER — CILOSTAZOL 100 MG PO TABS: 100.0000 mg | ORAL_TABLET | Freq: Every day | ORAL | Status: DC

## 2013-01-30 NOTE — ED Notes (Signed)
Pt reports she fell today fighting with laundry.  Has hematomacontusion to left knee, contusion to right cheek.  Denies LOC.

## 2013-01-30 NOTE — ED Notes (Signed)
Patient states she stumbled with her shoes and fell on a tile floor.  Hit left side of her face on the floor, left knee is swollen and painful.  Patient is on [place]

## 2013-01-31 ENCOUNTER — Telehealth: Payer: Self-pay

## 2013-01-31 ENCOUNTER — Other Ambulatory Visit: Payer: Self-pay | Admitting: *Deleted

## 2013-01-31 MED ORDER — FLUTICASONE-SALMETEROL 250-50 MCG/DOSE IN AEPB: 1.0000 | INHALATION_SPRAY | Freq: Two times a day (BID) | RESPIRATORY_TRACT | Status: DC

## 2013-01-31 NOTE — ED Provider Notes (Signed)
CSN: [id number]     Arrival date & time 01/30/13  2121 History   First MD Initiated Contact with Patient 01/30/13 2251     Chief Complaint  Patient presents with  . Fall   (Consider location/radiation/quality/duration/timing/severity/associated sxs/prior Treatment) HPI 79 year old female presents to emergency department from home with complaint of fall.  Patient stumbled and fell, landing on her left knee, left hand, and cheek.  Patient is on apixaban for afib.  He is complaining of swelling to her left knee cap, and left face, and abrasion to his left hand.  She is able to walk.  She has pain with full flexion of her left knee.  She denies any nausea or vomiting.  No headache.  She did not lose consciousness.   r Past Medical History  Diagnosis Date  . CVA (cerebral vascular accident)   . Sleep apnea     associated with hypersomnia  . Amiodarone pulmonary toxicity   . Renal disease     CHRONIC  . Diabetes   . Tobacco abuse   . Diastolic heart failure     Acute on Chronic  . Pacemaker     Permanent  . AF (atrial fibrillation)      AV ablation 9/09 [facility] per Dr Sampson Goon - AV node ablation 9/11 Dr Graciela Husbands  . Hyperlipidemia   . GERD (gastroesophageal reflux disease)   . [name] (coronary artery disease)     (not sure of this 11/10  . COPD (chronic obstructive pulmonary disease)     emphysema -FeV1 73% DLCO 53% 5/09  . PAD (peripheral artery disease)     w/hx right iliac/SFA stenting and left leg PTA  . Invasive ductal carcinoma of breast 2011    LEFT   . OA (osteoarthritis) of knee     RIGHT  . Right sided sciatica   . Sinoatrial node dysfunction   . CHF (congestive heart failure)   . Mental disorder   . Acute blood loss anemia 04/20/2012  . Depression 06/06/2012   Past Surgical History  Procedure Laterality Date  . Mastectomy, partial  02/03/2010    Left/Dr Rosenbower  . Cataract extraction, bilateral      with IOL/Dr Groat  . Breast lumpectomy      left breast  .  Orif acetabular fracture Left 04/19/2012    Procedure: OPEN REDUCTION INTERNAL FIXATION (ORIF) ACETABULAR FRACTURE;  Surgeon: Budd Palmer, MD;  Location: [facility] OR;  Service: Orthopedics;  Laterality: Left;   Family History  Problem Relation Age of Onset  . Heart disease Father    History  Substance Use Topics  . Smoking status: Former Smoker -- 1.00 packs/day for 55 years    Types: Cigarettes    Quit date: 12/19/2011  . Smokeless tobacco: Never Used     Comment: started smoking at age 46--4 cigs per day  . Alcohol Use: Yes     Comment: wine occasionally   OB History   Grav Para Term Preterm Abortions TAB SAB Ect Mult Living                 Review of Systems  See History of Present Illness; otherwise all other systems are reviewed and negative  Allergies  Cholestatin and Sulfur  Home Medications   Current Outpatient Rx  Name  Route  Sig  Dispense  Refill  . apixaban (ELIQUIS) 5 MG TABS tablet   Oral   Take 1 tablet (5 mg total) by mouth 2 (two) times daily.  28 tablet   0   . Armodafinil (NUVIGIL) 150 MG tablet      Take 1 tab every morning   90 tablet   0   . atenolol (TENORMIN) 25 MG tablet   Oral   Take 25 mg by mouth daily.         Marland Kitchen atorvastatin (LIPITOR) 20 MG tablet   Oral   Take 1 tablet (20 mg total) by mouth daily.   14 tablet   0   . cholecalciferol (VITAMIN D) 1000 UNITS tablet   Oral   Take 1 tablet (1,000 Units total) by mouth daily.   100 tablet   3   . cilostazol (PLETAL) 100 MG tablet   Oral   Take 1 tablet (100 mg total) by mouth at bedtime. And again after breakfast if remembers   90 tablet   2   . Cyanocobalamin (VITAMIN B-12) 500 MCG SUBL      1 tab sl qd   150 tablet   3   . ferrous sulfate 325 (65 FE) MG tablet   Oral   Take 1 tablet (325 mg total) by mouth daily with breakfast.         . Fluticasone-Salmeterol (ADVAIR) 250-50 MCG/DOSE AEPB   Inhalation   Inhale 1 puff into the lungs 2 (two) times daily.   1  each   6   . letrozole (FEMARA) 2.5 MG tablet   Oral   Take 1 tablet (2.5 mg total) by mouth daily.   90 tablet   12   . levalbuterol (XOPENEX HFA) 45 MCG/ACT inhaler   Inhalation   Inhale 2 puffs into the lungs every 6 (six) hours as needed. For shortness of breath         . magnesium oxide (MAG-OX) 400 (241.3 MG) MG tablet   Oral   Take 0.5 tablets (200 mg total) by mouth daily.   30 tablet   11   . metFORMIN (GLUCOPHAGE) 500 MG tablet   Oral   Take 1 tablet (500 mg total) by mouth 2 (two) times daily with a meal.   60 tablet   6   . potassium chloride SA (K-DUR,KLOR-CON) 20 MEQ tablet      Take 2 tablets in morning and Take 3 tablets every evening   450 tablet   6   . tiotropium (SPIRIVA HANDIHALER) 18 MCG inhalation capsule   Inhalation   Place 1 capsule (18 mcg total) into inhaler and inhale daily.   90 capsule   4   . torsemide (DEMADEX) 10 MG tablet   Oral   Take 1 tablet (10 mg total) by mouth daily.   90 tablet   3    BP 122/64  Pulse 81  Temp(Src) 97.7 F (36.5 C) (Oral)  Resp 20  Ht 5\' 5"  (1.651 m)  Wt 168 lb (76.204 kg)  BMI 27.96 kg/m2  SpO2 96% Physical Exam  Nursing note and vitals reviewed. Constitutional: She is oriented to person, place, and time. She appears well-developed and well-nourished. No distress.  HENT:  Head: Normocephalic.  Right Ear: External ear normal.  Left Ear: External ear normal.  Nose: Nose normal.  Mouth/Throat: Oropharynx is clear and moist.  Hematoma noted to left cheek  Eyes: Conjunctivae and EOM are normal. Pupils are equal, round, and reactive to light.  Neck: Normal range of motion. Neck supple. No JVD present. No tracheal deviation present. No thyromegaly present.  Cardiovascular: Normal rate, regular rhythm, normal  heart sounds and intact distal pulses.  Exam reveals no gallop and no friction rub.   No murmur heard. Pulmonary/Chest: Effort normal and breath sounds normal. No stridor. No respiratory  distress. She has no wheezes. She has no rales. She exhibits no tenderness.  Abdominal: Soft. Bowel sounds are normal. She exhibits no distension and no mass. There is no tenderness. There is no rebound and no guarding.  Musculoskeletal: She exhibits tenderness. She exhibits no edema.  Hematoma noted to left patella, tender to palpation with slight skin tenting around it.  No crepitus.  Slight decreased range of motion secondary to pain.  At hematoma.  No effusion, no joint line tenderness.  No instability noted  Lymphadenopathy:    She has no cervical adenopathy.  Neurological: She is alert and oriented to person, place, and time. She has normal reflexes. No cranial nerve deficit. She exhibits normal muscle tone. Coordination normal.  Skin: Skin is warm and dry. No rash noted. No erythema. No pallor.  Psychiatric: She has a normal mood and affect. Her behavior is normal. Judgment and thought content normal.    ED Course  Procedures (including critical care time) Labs Review Labs Reviewed - No data to display Imaging Review Dg Knee 2 Views Left  01/30/2013   CLINICAL DATA:  Fall.  Left knee injury and pain.  EXAM: LEFT KNEE - 1-2 VIEW  COMPARISON:  None.  FINDINGS: There is no evidence of fracture, dislocation, or joint effusion. There is no evidence of arthropathy or other focal bone abnormality. Prepatellar soft tissue swelling is noted. Mild peripheral vascular calcification also demonstrated. Marland Kitchen  IMPRESSION: Prepatellar soft tissue swelling. No evidence of fracture or dislocation.   Electronically Signed   By: Myles Rosenthal M.D.   On: 01/30/2013 23:44   Ct Head Wo Contrast  01/30/2013   CLINICAL DATA:  Fall.  Head injury and pain.  On anticoagulation.  EXAM: CT HEAD WITHOUT CONTRAST  TECHNIQUE: Contiguous axial images were obtained from the base of the skull through the vertex without intravenous contrast.  COMPARISON:  12/15/2011  FINDINGS: There is no evidence of intracranial hemorrhage,  brain edema, or other signs of acute infarction. There is no evidence of intracranial mass lesion or mass effect. No abnormal extraaxial fluid collections are identified.  Mild to moderate diffuse cerebral atrophy again demonstrated as well as mild chronic small vessel disease. Ventricles are stable in size. No skull fracture or other bone lesions identified.  IMPRESSION: No acute intracranial findings.  Stable cerebral atrophy and chronic small vessel disease.   Electronically Signed   By: Myles Rosenthal M.D.   On: 01/30/2013 23:54    EKG Interpretation   None       MDM   1. Contusion of left knee, initial encounter   2. Facial contusion, initial encounter   3. Fall at home, initial encounter    79 year old female status post mechanical fall at home.  She has abrasion to left hand and contusion to knee and face.  CT scans unremarkable.  X-rays without fracture.  She is able to ambulate.  She has good followup established.    Olivia Mackie, MD 01/31/13 [phone]

## 2013-01-31 NOTE — Telephone Encounter (Signed)
The patient called and wanted to report that she fell last night.  She went to the [facility] ER and they evaluated her and did xyrays.  She states she is not in pain, and not experiencing any other side affects.  The hospital recommended she follow up with Primary Care Dr.   Drucilla Schmidt you want her worked in tomorrow? Or added to your schedule Monday?   Thanks!

## 2013-02-01 NOTE — Telephone Encounter (Signed)
Patient scheduled and is aware of apt. Thanks!

## 2013-02-01 NOTE — Telephone Encounter (Signed)
I will see her Friday - 11:30. I am on call for my hospital group and will rounding all morning.

## 2013-02-03 ENCOUNTER — Ambulatory Visit (INDEPENDENT_AMBULATORY_CARE_PROVIDER_SITE_OTHER): Payer: Medicare Other | Admitting: Internal Medicine

## 2013-02-03 ENCOUNTER — Encounter: Payer: Self-pay | Admitting: Internal Medicine

## 2013-02-03 VITALS — BP 100/76 | HR 78 | Temp 98.2°F | Wt 175.0 lb

## 2013-02-03 DIAGNOSIS — G4733 Obstructive sleep apnea (adult) (pediatric): Secondary | ICD-10-CM

## 2013-02-03 DIAGNOSIS — G4710 Hypersomnia, unspecified: Secondary | ICD-10-CM | POA: Diagnosis not present

## 2013-02-03 DIAGNOSIS — E119 Type 2 diabetes mellitus without complications: Secondary | ICD-10-CM | POA: Diagnosis not present

## 2013-02-03 MED ORDER — ARMODAFINIL 150 MG PO TABS: ORAL_TABLET | ORAL | Status: DC

## 2013-02-03 NOTE — Progress Notes (Signed)
Subjective:    Patient ID: [name], female    DOB: May 10, 1931, 79 y.o.   MRN: [id number]  HPI Mrs. Eckford presents for ER follow-up after a fall (reviewed notes, imaging reports): she sustained a hematoma of the left knee and bruising of the left face. She had negative x-rays - no fracture. She reports the swelling of the knee is resolving.  She has seen Dr. Carola Frost for follow up and also saw Dr. Michiel Sites for left knee and leg pain.  She is concerned for bags under her eyes which she doesn't like and would like treatment. She also c/o oral thrush - white coated tongue every morning. She is also concerned that she may have a yeast infection of the sinuses - she is not immunocompromised.   She is seeing Dr. Vassie Loll - she is taking Nuvigil. She is too have follow up testing of some sort.   Past Medical History  Diagnosis Date  . CVA (cerebral vascular accident)   . Sleep apnea     associated with hypersomnia  . Amiodarone pulmonary toxicity   . Renal disease     CHRONIC  . Diabetes   . Tobacco abuse   . Diastolic heart failure     Acute on Chronic  . Pacemaker     Permanent  . AF (atrial fibrillation)      AV ablation 9/09 [facility] per Dr Sampson Goon - AV node ablation 9/11 Dr Graciela Husbands  . Hyperlipidemia   . GERD (gastroesophageal reflux disease)   . [name] (coronary artery disease)     (not sure of this 11/10  . COPD (chronic obstructive pulmonary disease)     emphysema -FeV1 73% DLCO 53% 5/09  . PAD (peripheral artery disease)     w/hx right iliac/SFA stenting and left leg PTA  . Invasive ductal carcinoma of breast 2011    LEFT   . OA (osteoarthritis) of knee     RIGHT  . Right sided sciatica   . Sinoatrial node dysfunction   . CHF (congestive heart failure)   . Mental disorder   . Acute blood loss anemia 04/20/2012  . Depression 06/06/2012   Past Surgical History  Procedure Laterality Date  . Mastectomy, partial  02/03/2010    Left/Dr Rosenbower  . Cataract extraction,  bilateral      with IOL/Dr Groat  . Breast lumpectomy      left breast  . Orif acetabular fracture Left 04/19/2012    Procedure: OPEN REDUCTION INTERNAL FIXATION (ORIF) ACETABULAR FRACTURE;  Surgeon: Budd Palmer, MD;  Location: [facility] OR;  Service: Orthopedics;  Laterality: Left;   Family History  Problem Relation Age of Onset  . Heart disease Father    History   Social History  . Marital Status: Married    Spouse Name: N/A    Number of Children: N/A  . Years of Education: N/A   Occupational History  . Not on file.   Social History Main Topics  . Smoking status: Former Smoker -- 1.00 packs/day for 55 years    Types: Cigarettes    Quit date: 12/19/2011  . Smokeless tobacco: Never Used     Comment: started smoking at age 77--4 cigs per day  . Alcohol Use: Yes     Comment: wine occasionally  . Drug Use: No  . Sexual Activity: No   Other Topics Concern  . Not on file   Social History Narrative   HS Graduate; [employer]. [employer].  Married '61.  2 sons - '70, '71; Dtrs - '64,'68;    6 grandchildren.  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 Interests -Tai-chi & water aerobics, gardening, active lifestyle.  Marriage a bit stressful - SO w/membory problems and difficult behavior. She denies any personal safety concerns. End of life care: need to address at next OV              Current Outpatient Prescriptions on File Prior to Visit  Medication Sig Dispense Refill  . apixaban (ELIQUIS) 5 MG TABS tablet Take 1 tablet (5 mg total) by mouth 2 (two) times daily.  28 tablet  0  . atenolol (TENORMIN) 25 MG tablet Take 25 mg by mouth daily.      Marland Kitchen atorvastatin (LIPITOR) 20 MG tablet Take 1 tablet (20 mg total) by mouth daily.  14 tablet  0  . cholecalciferol (VITAMIN D) 1000 UNITS tablet Take 1 tablet (1,000 Units total) by mouth  daily.  100 tablet  3  . cilostazol (PLETAL) 100 MG tablet Take 1 tablet (100 mg total) by mouth at bedtime. And again after breakfast if remembers  90 tablet  2  . Cyanocobalamin (VITAMIN B-12) 500 MCG SUBL 1 tab sl qd  150 tablet  3  . ferrous sulfate 325 (65 FE) MG tablet Take 1 tablet (325 mg total) by mouth daily with breakfast.      . Fluticasone-Salmeterol (ADVAIR) 250-50 MCG/DOSE AEPB Inhale 1 puff into the lungs 2 (two) times daily.  3 each  1  . letrozole (FEMARA) 2.5 MG tablet Take 1 tablet (2.5 mg total) by mouth daily.  90 tablet  12  . levalbuterol (XOPENEX HFA) 45 MCG/ACT inhaler Inhale 2 puffs into the lungs every 6 (six) hours as needed. For shortness of breath      . magnesium oxide (MAG-OX) 400 (241.3 MG) MG tablet Take 0.5 tablets (200 mg total) by mouth daily.  30 tablet  11  . metFORMIN (GLUCOPHAGE) 500 MG tablet Take 1 tablet (500 mg total) by mouth 2 (two) times daily with a meal.  60 tablet  6  . potassium chloride SA (K-DUR,KLOR-CON) 20 MEQ tablet Take 2 tablets in morning and Take 3 tablets every evening  450 tablet  6  . tiotropium (SPIRIVA HANDIHALER) 18 MCG inhalation capsule Place 1 capsule (18 mcg total) into inhaler and inhale daily.  90 capsule  4  . torsemide (DEMADEX) 10 MG tablet Take 1 tablet (10 mg total) by mouth daily.  90 tablet  3   No current facility-administered medications on file prior to visit.     Review of Systems System review is negative for any constitutional, cardiac, pulmonary, GI or neuro symptoms or complaints other than as described in the HPI.     Objective:   Physical Exam Filed Vitals:   02/03/13 1139  BP: 100/76  Pulse: 78  Temp: 98.2 F (36.8 C)   gen'l - elderly woman in no acute distress. Irritated about her overall situation HEENT- left facial bruising in the infra orbital region. C&S clear Cor - RRR PUlm - no increased WOB MSK - hematoma left knee, not hot or tender, normal ROM       Assessment & Plan:  Fall -  no loss of consciousness, stumbled while doing several tasks at once.  Plan  no additional imaging or testing needed.

## 2013-02-03 NOTE — Progress Notes (Signed)
Pre visit review using our clinic review tool, if applicable. No additional management support is needed unless otherwise documented below in the visit note. 

## 2013-02-03 NOTE — Patient Instructions (Signed)
Good to see you and I am glad that you are not injured.  1. Continue to ice your knee  2. No clinical evidence of thrush on exam  3. Hypersomnolence - please resume Nuvigil, keep the appointment for daytime sleep study and be sure the report comes to me. I will call you. If there is no problem on the study - you will be released to drive.
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
## 2013-02-05 NOTE — Assessment & Plan Note (Signed)
Lab Results  Component Value Date   HGBA1C 7.0* 09/08/2012   By family report she is not complete adherent to DM diet. Last A1C with OK control.

## 2013-02-05 NOTE — Assessment & Plan Note (Signed)
She is current with Dr. Vassie Loll. By report she is using CPAP on a regular basis.

## 2013-02-06 ENCOUNTER — Telehealth: Payer: Self-pay | Admitting: Critical Care Medicine

## 2013-02-06 DIAGNOSIS — J449 Chronic obstructive pulmonary disease, unspecified: Secondary | ICD-10-CM

## 2013-02-06 MED ORDER — TIOTROPIUM BROMIDE MONOHYDRATE 18 MCG IN CAPS: 18.0000 ug | ORAL_CAPSULE | Freq: Every day | RESPIRATORY_TRACT | Status: DC

## 2013-02-06 NOTE — Telephone Encounter (Signed)
I called and spoke with pt. Aware RX has been sent.

## 2013-02-07 ENCOUNTER — Telehealth: Payer: Self-pay | Admitting: Critical Care Medicine

## 2013-02-07 NOTE — Telephone Encounter (Signed)
Pt requested a sample of spiriva.  Advised pt would be at the front desk for pick up. Nothing further needed

## 2013-02-10 ENCOUNTER — Ambulatory Visit: Payer: Medicare Other | Admitting: Family Medicine

## 2013-03-08 ENCOUNTER — Telehealth: Payer: Self-pay | Admitting: *Deleted

## 2013-03-08 NOTE — Telephone Encounter (Signed)
Pt called requesting Armodafinil refill be faxed to [facility] today.  Please advise

## 2013-03-10 MED ORDER — ARMODAFINIL 150 MG PO TABS: ORAL_TABLET | ORAL | Status: DC

## 2013-03-10 NOTE — Telephone Encounter (Signed)
Ok for refill? 

## 2013-03-10 NOTE — Telephone Encounter (Signed)
Rx sent, no VM

## 2013-03-14 ENCOUNTER — Telehealth: Payer: Self-pay

## 2013-03-14 NOTE — Telephone Encounter (Signed)
Nuvigil 150 mg 90/90 is covered until 03/14/2015. This info has been faxed to [facility].

## 2013-03-14 NOTE — Telephone Encounter (Signed)
Prior authorization for [name] is required. PA form was filled out on covermymeds and submitted. Notification was received back that this request is favorable and fax notification will be sent.

## 2013-03-16 ENCOUNTER — Encounter: Payer: Self-pay | Admitting: Internal Medicine

## 2013-03-16 DIAGNOSIS — Z950 Presence of cardiac pacemaker: Secondary | ICD-10-CM | POA: Diagnosis not present

## 2013-03-16 DIAGNOSIS — I495 Sick sinus syndrome: Secondary | ICD-10-CM | POA: Diagnosis not present

## 2013-03-20 ENCOUNTER — Telehealth: Payer: Self-pay | Admitting: Internal Medicine

## 2013-03-20 NOTE — Telephone Encounter (Signed)
Tried to call (no answer, cant leave vm) to inform pt that handicap renewal paper work is ready to be pick up, left for pt up front.

## 2013-03-28 ENCOUNTER — Other Ambulatory Visit: Payer: Self-pay | Admitting: Cardiovascular Disease

## 2013-04-12 DIAGNOSIS — IMO0001 Reserved for inherently not codable concepts without codable children: Secondary | ICD-10-CM | POA: Diagnosis not present

## 2013-04-12 DIAGNOSIS — S32409A Unspecified fracture of unspecified acetabulum, initial encounter for closed fracture: Secondary | ICD-10-CM | POA: Diagnosis not present

## 2013-04-25 ENCOUNTER — Other Ambulatory Visit: Payer: Self-pay | Admitting: Physician Assistant

## 2013-04-25 DIAGNOSIS — C50912 Malignant neoplasm of unspecified site of left female breast: Secondary | ICD-10-CM

## 2013-04-25 MED ORDER — LETROZOLE 2.5 MG PO TABS: 2.5000 mg | ORAL_TABLET | Freq: Every day | ORAL | Status: DC

## 2013-05-03 DIAGNOSIS — Z0279 Encounter for issue of other medical certificate: Secondary | ICD-10-CM

## 2013-05-05 ENCOUNTER — Other Ambulatory Visit: Payer: Self-pay | Admitting: *Deleted

## 2013-05-05 ENCOUNTER — Telehealth: Payer: Self-pay | Admitting: *Deleted

## 2013-05-05 DIAGNOSIS — Z961 Presence of intraocular lens: Secondary | ICD-10-CM | POA: Diagnosis not present

## 2013-05-05 DIAGNOSIS — H1045 Other chronic allergic conjunctivitis: Secondary | ICD-10-CM | POA: Diagnosis not present

## 2013-05-05 DIAGNOSIS — E119 Type 2 diabetes mellitus without complications: Secondary | ICD-10-CM | POA: Diagnosis not present

## 2013-05-05 DIAGNOSIS — C50919 Malignant neoplasm of unspecified site of unspecified female breast: Secondary | ICD-10-CM

## 2013-05-05 DIAGNOSIS — H26499 Other secondary cataract, unspecified eye: Secondary | ICD-10-CM | POA: Diagnosis not present

## 2013-05-05 DIAGNOSIS — H02839 Dermatochalasis of unspecified eye, unspecified eyelid: Secondary | ICD-10-CM | POA: Diagnosis not present

## 2013-05-05 MED ORDER — LETROZOLE 2.5 MG PO TABS: 2.5000 mg | ORAL_TABLET | Freq: Every day | ORAL | Status: DC

## 2013-05-05 NOTE — Telephone Encounter (Signed)
Pt called very upset because she received a letter from her pharmacy that her medication was denied by Dr. Jana Hakim.  I remember speaking with her on 2/27 and scheduled her appts for March.  I reminded her of our conversation and she remembered.  I mailed pt a calendar.  I took information to Val about the medication so she could f/u with the patient.

## 2013-05-09 DIAGNOSIS — N039 Chronic nephritic syndrome with unspecified morphologic changes: Secondary | ICD-10-CM | POA: Diagnosis not present

## 2013-05-09 DIAGNOSIS — N182 Chronic kidney disease, stage 2 (mild): Secondary | ICD-10-CM | POA: Diagnosis not present

## 2013-05-09 DIAGNOSIS — D631 Anemia in chronic kidney disease: Secondary | ICD-10-CM | POA: Diagnosis not present

## 2013-05-09 DIAGNOSIS — I129 Hypertensive chronic kidney disease with stage 1 through stage 4 chronic kidney disease, or unspecified chronic kidney disease: Secondary | ICD-10-CM | POA: Diagnosis not present

## 2013-05-09 DIAGNOSIS — N2581 Secondary hyperparathyroidism of renal origin: Secondary | ICD-10-CM | POA: Diagnosis not present

## 2013-05-24 ENCOUNTER — Other Ambulatory Visit (HOSPITAL_BASED_OUTPATIENT_CLINIC_OR_DEPARTMENT_OTHER): Payer: Medicare Other

## 2013-05-24 DIAGNOSIS — C50919 Malignant neoplasm of unspecified site of unspecified female breast: Secondary | ICD-10-CM

## 2013-05-24 LAB — CBC WITH DIFFERENTIAL/PLATELET
BASO%: 1.1 % (ref 0.0–2.0)
Basophils Absolute: 0.1 10*3/uL (ref 0.0–0.1)
EOS ABS: 0.4 10*3/uL (ref 0.0–0.5)
EOS%: 3.7 % (ref 0.0–7.0)
HEMATOCRIT: 37.1 % (ref 34.8–46.6)
HEMOGLOBIN: 12.1 g/dL (ref 11.6–15.9)
LYMPH#: 2.4 10*3/uL (ref 0.9–3.3)
LYMPH%: 21 % (ref 14.0–49.7)
MCH: 29 pg (ref 25.1–34.0)
MCHC: 32.6 g/dL (ref 31.5–36.0)
MCV: 88.9 fL (ref 79.5–101.0)
MONO#: 1.1 10*3/uL — AB (ref 0.1–0.9)
MONO%: 9.4 % (ref 0.0–14.0)
NEUT%: 64.8 % (ref 38.4–76.8)
NEUTROS ABS: 7.3 10*3/uL — AB (ref 1.5–6.5)
Platelets: 243 10*3/uL (ref 145–400)
RBC: 4.18 10*6/uL (ref 3.70–5.45)
RDW: 15 % — ABNORMAL HIGH (ref 11.2–14.5)
WBC: 11.3 10*3/uL — AB (ref 3.9–10.3)

## 2013-05-24 LAB — COMPREHENSIVE METABOLIC PANEL (CC13)
ALT: 12 U/L (ref 0–55)
AST: 15 U/L (ref 5–34)
Albumin: 3.2 g/dL — ABNORMAL LOW (ref 3.5–5.0)
Alkaline Phosphatase: 78 U/L (ref 40–150)
Anion Gap: 9 mEq/L (ref 3–11)
BUN: 14 mg/dL (ref 7.0–26.0)
CALCIUM: 9.7 mg/dL (ref 8.4–10.4)
CHLORIDE: 108 meq/L (ref 98–109)
CO2: 24 meq/L (ref 22–29)
CREATININE: 0.8 mg/dL (ref 0.6–1.1)
Glucose: 134 mg/dl (ref 70–140)
Potassium: 4.4 mEq/L (ref 3.5–5.1)
Sodium: 141 mEq/L (ref 136–145)
Total Bilirubin: 0.4 mg/dL (ref 0.20–1.20)
Total Protein: 7.1 g/dL (ref 6.4–8.3)

## 2013-05-29 ENCOUNTER — Ambulatory Visit (INDEPENDENT_AMBULATORY_CARE_PROVIDER_SITE_OTHER): Payer: Medicare Other | Admitting: Pulmonary Disease

## 2013-05-29 ENCOUNTER — Encounter: Payer: Self-pay | Admitting: Pulmonary Disease

## 2013-05-29 VITALS — BP 124/70 | HR 68 | Temp 97.5°F | Wt 185.0 lb

## 2013-05-29 DIAGNOSIS — G4733 Obstructive sleep apnea (adult) (pediatric): Secondary | ICD-10-CM | POA: Diagnosis not present

## 2013-05-29 DIAGNOSIS — J449 Chronic obstructive pulmonary disease, unspecified: Secondary | ICD-10-CM

## 2013-05-29 DIAGNOSIS — J4489 Other specified chronic obstructive pulmonary disease: Secondary | ICD-10-CM

## 2013-05-29 DIAGNOSIS — J441 Chronic obstructive pulmonary disease with (acute) exacerbation: Secondary | ICD-10-CM | POA: Insufficient documentation
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
## 2013-05-29 NOTE — Assessment & Plan Note (Signed)
She remains very frustrated about her inability to drive. She never followed through with the MSL T.- at any rate I do doubt the diagnosis of narcolepsy here. It is more likely that her somnolence is idiopathic and persists in spite of CPAP compliance. Nuvigil clearly helped her but she does not want to continue this due to the high cost. She was quite frustrated and expressed that I was unable to help her. I offered her consultation with another sleep provider, but she declined.  I I do not have anything more to offer her. I think her CPAP care at this point can be turned over to her primary care provider. I do understand that Dr. Linda Hedges is retiring, and she will have a new PCP. She will continue to follow with [name] for COPD issues.

## 2013-05-29 NOTE — Assessment & Plan Note (Signed)
Ct advair & spiriva

## 2013-05-29 NOTE — Patient Instructions (Signed)
CPAP supplies will be renewed for a year Ambulatory saturation You can keep pulmonary follow up with Dr Joya Gaskins We will ask your PCP to take over management of your sleep apnea

## 2013-05-31 ENCOUNTER — Encounter: Payer: Self-pay | Admitting: Family Medicine

## 2013-05-31 ENCOUNTER — Ambulatory Visit (INDEPENDENT_AMBULATORY_CARE_PROVIDER_SITE_OTHER): Payer: Medicare Other | Admitting: Family Medicine

## 2013-05-31 ENCOUNTER — Ambulatory Visit: Payer: Medicare Other | Admitting: Family Medicine

## 2013-05-31 VITALS — BP 123/72 | HR 87 | Temp 98.0°F | Resp 18 | Ht 65.0 in | Wt 186.0 lb

## 2013-05-31 DIAGNOSIS — J309 Allergic rhinitis, unspecified: Secondary | ICD-10-CM

## 2013-05-31 DIAGNOSIS — R042 Hemoptysis: Secondary | ICD-10-CM

## 2013-05-31 MED ORDER — FLUTICASONE PROPIONATE 50 MCG/ACT NA SUSP: 2.0000 | Freq: Every day | NASAL | Status: DC

## 2013-05-31 NOTE — Patient Instructions (Signed)
Buy over the counter nasal spray called "saline nasal spray" (generic) and take 2-3 sprays each nostril 2-3 times a day and blow your nose gently.

## 2013-05-31 NOTE — Progress Notes (Signed)
Pre visit review using our clinic review tool, if applicable. No additional management support is needed unless otherwise documented below in the visit note. 

## 2013-05-31 NOTE — Progress Notes (Signed)
OFFICE NOTE  05/31/2013  CC:  Chief Complaint  Patient presents with  . Mouth Lesions    bleeding in mouth, roof of mouth     HPI: Patient is a 80 y.o. Caucasian female who is here for some bloody mucous over the last week or so, says it feels like she has some nasal congestion and has some PND.  No cough.  No fevers.  No blood noted in urine or stool. No new skin bruising--"same as the last couple of years". She is on eliquis and is compliant with this. She has OSA and sleeps with CPAP with humidifier. No SOB.   Pertinent PMH:  Past medical, surgical, social, and family history reviewed and no changes are noted since last office visit.  MEDS:  Outpatient Prescriptions Prior to Visit  Medication Sig Dispense Refill  . apixaban (ELIQUIS) 5 MG TABS tablet Take 1 tablet (5 mg total) by mouth 2 (two) times daily.  28 tablet  0  . atenolol (TENORMIN) 25 MG tablet Take 25 mg by mouth daily.      Marland Kitchen atorvastatin (LIPITOR) 20 MG tablet Take 1 tablet (20 mg total) by mouth daily.  14 tablet  0  . cholecalciferol (VITAMIN D) 1000 UNITS tablet Take 1 tablet (1,000 Units total) by mouth daily.  100 tablet  3  . cilostazol (PLETAL) 100 MG tablet TAKE 1 TABLET  AT BEDTIME. AND AGAIN AFTER BREAKFAST IF REMEMBERS  180 tablet  2  . Cyanocobalamin (VITAMIN B-12 CR PO) Take 1 tablet by mouth daily.      . Fluticasone-Salmeterol (ADVAIR) 250-50 MCG/DOSE AEPB Inhale 1 puff into the lungs 2 (two) times daily.  3 each  1  . letrozole (FEMARA) 2.5 MG tablet Take 1 tablet (2.5 mg total) by mouth daily.  90 tablet  0  . levalbuterol (XOPENEX HFA) 45 MCG/ACT inhaler Inhale 2 puffs into the lungs every 6 (six) hours as needed. For shortness of breath      . magnesium oxide (MAG-OX) 400 (241.3 MG) MG tablet Take 0.5 tablets (200 mg total) by mouth daily.  30 tablet  11  . metFORMIN (GLUCOPHAGE) 500 MG tablet Take 1 tablet (500 mg total) by mouth 2 (two) times daily with a meal.  60 tablet  6  . potassium  chloride SA (K-DUR,KLOR-CON) 20 MEQ tablet Take 3 tablets in morning and Take 2 tablets every evening      . tiotropium (SPIRIVA HANDIHALER) 18 MCG inhalation capsule Place 1 capsule (18 mcg total) into inhaler and inhale daily.  30 capsule  4  . torsemide (DEMADEX) 10 MG tablet Take 1 tablet (10 mg total) by mouth daily.  90 tablet  3   No facility-administered medications prior to visit.    PE: Blood pressure 123/72, pulse 87, temperature 98 F (36.7 C), temperature source Oral, resp. rate 18, height 5\' 5"  (1.651 m), weight 186 lb (84.369 kg), SpO2 97.00%. Gen: Alert, well appearing.  Patient is oriented to person, place, time, and situation. AFFECT: pleasant, lucid thought and speech. ENT: Ears: EACs clear, normal epithelium.  TMs with good light reflex and landmarks bilaterally.  Eyes: no injection, icteris, swelling, or exudate.  EOMI, PERRLA. Nose: no drainage or turbinate edema/swelling.  No injection or focal lesion.  Mouth: lips without lesion/swelling.  Oral mucosa pink and moist.  Upper dentures in place but pt removed them and all looked normal.  Oropharynx without erythema, exudate, or swelling.    IMPRESSION AND PLAN:  Allergic rhinitis  with scant PND that is blood tinged. Reassured pt that all was fine, encouraged her to start saline nasal spray 2-3 times per day with gentle blowing of the nasal passages.  Restart nasal steroid.  Signs/symptoms to call or return for were reviewed and pt expressed understanding.  FOLLOW UP: prn

## 2013-06-02 ENCOUNTER — Telehealth: Payer: Self-pay | Admitting: Oncology

## 2013-06-02 ENCOUNTER — Ambulatory Visit (HOSPITAL_BASED_OUTPATIENT_CLINIC_OR_DEPARTMENT_OTHER): Payer: Medicare Other | Admitting: Oncology

## 2013-06-02 ENCOUNTER — Other Ambulatory Visit: Payer: Self-pay | Admitting: Oncology

## 2013-06-02 VITALS — BP 112/71 | HR 79 | Temp 97.8°F | Resp 18 | Ht 65.0 in | Wt 188.7 lb

## 2013-06-02 DIAGNOSIS — C50919 Malignant neoplasm of unspecified site of unspecified female breast: Secondary | ICD-10-CM

## 2013-06-02 DIAGNOSIS — J449 Chronic obstructive pulmonary disease, unspecified: Secondary | ICD-10-CM

## 2013-06-02 DIAGNOSIS — I2510 Atherosclerotic heart disease of native coronary artery without angina pectoris: Secondary | ICD-10-CM

## 2013-06-02 DIAGNOSIS — I4891 Unspecified atrial fibrillation: Secondary | ICD-10-CM

## 2013-06-02 DIAGNOSIS — C50419 Malignant neoplasm of upper-outer quadrant of unspecified female breast: Secondary | ICD-10-CM | POA: Diagnosis not present

## 2013-06-02 DIAGNOSIS — Z853 Personal history of malignant neoplasm of breast: Secondary | ICD-10-CM

## 2013-06-02 DIAGNOSIS — J309 Allergic rhinitis, unspecified: Secondary | ICD-10-CM

## 2013-06-02 DIAGNOSIS — Z170 Estrogen receptor positive status [ER+]: Secondary | ICD-10-CM

## 2013-06-02 DIAGNOSIS — E119 Type 2 diabetes mellitus without complications: Secondary | ICD-10-CM

## 2013-06-02 DIAGNOSIS — M899 Disorder of bone, unspecified: Secondary | ICD-10-CM | POA: Diagnosis not present

## 2013-06-02 DIAGNOSIS — Z9889 Other specified postprocedural states: Secondary | ICD-10-CM

## 2013-06-02 DIAGNOSIS — M949 Disorder of cartilage, unspecified: Secondary | ICD-10-CM | POA: Diagnosis not present

## 2013-06-02 DIAGNOSIS — I6529 Occlusion and stenosis of unspecified carotid artery: Secondary | ICD-10-CM

## 2013-06-02 DIAGNOSIS — M8580 Other specified disorders of bone density and structure, unspecified site: Secondary | ICD-10-CM

## 2013-06-02 DIAGNOSIS — G250 Essential tremor: Secondary | ICD-10-CM

## 2013-06-02 MED ORDER — LETROZOLE 2.5 MG PO TABS: 2.5000 mg | ORAL_TABLET | Freq: Every day | ORAL | Status: DC

## 2013-06-02 NOTE — Telephone Encounter (Signed)
, °
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
## 2013-06-03 IMAGING — NM NM BONE 3 PHASE
2 series · 12 of 12 positions shown · non-contrast
Comparison: Left tib-fib on 05/14/2012

CLINICAL DATA: Evaluate for osteomyelitis.  Open sore below the
left anterior knee.

NUCLEAR MEDICINE 3-PHASE BONE SCAN
TECHNIQUE: Radionuclide angiographic images, immediate static
blood pool images, and 3-hour delayed static images were obtained
of the knees after intravenous injection of radiopharmaceutical.
Radiopharmaceutical: 24.0 Technetium 99 MDP

[Series 1: fl flow and static · 4.75mm/px · 6 of 40 frames shown (1 of 2)]
[frame 4/40  full-range]
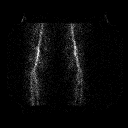
[frame 10/40  full-range]
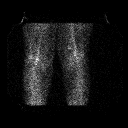
[frame 17/40  full-range]
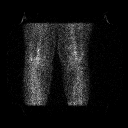
[frame 24/40  full-range]
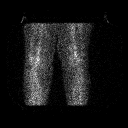
[frame 30/40  full-range]
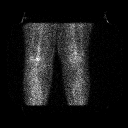
[frame 37/40  full-range]
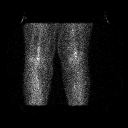

[Series 1: fl flow and static · 4.75mm/px · 6 of 40 frames shown (2 of 2)]
[frame 4/40  full-range]
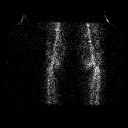
[frame 10/40  full-range]
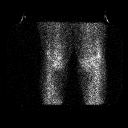
[frame 17/40  full-range]
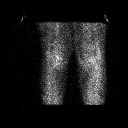
[frame 24/40  full-range]
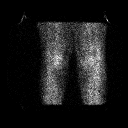
[frame 30/40  full-range]
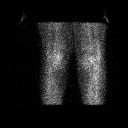
[frame 37/40  full-range]
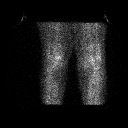

[12 of 12 positions shown; findings below may reference images not displayed]

FINDINGS: There is increased osseous remodelling in the knees
bilaterally, most consistent with degenerative changes, primarily
involving the medial compartments, right greater than left.  No
changes are identified to suggest osteomyelitis or significant
cellulitis involving the area of concern in the anterior distal
left knee.
IMPRESSION: 1.  No evidence for acute osteomyelitis.
2.  Degenerative changes in both knees, right greater than left.

## 2013-06-12 ENCOUNTER — Telehealth: Payer: Self-pay | Admitting: Pulmonary Disease

## 2013-06-12 NOTE — Telephone Encounter (Signed)
Sure, I can do this.

## 2013-06-12 NOTE — Telephone Encounter (Signed)
3 mnth cpap download 05/2013 - shows good compliance, no residual events Can PCP take over signing of CMNs starting next year -2016? She does not need/want to see me anymore.

## 2013-06-12 NOTE — Telephone Encounter (Signed)
Thanks,appreciate it

## 2013-06-15 ENCOUNTER — Other Ambulatory Visit: Payer: Self-pay

## 2013-06-15 DIAGNOSIS — I495 Sick sinus syndrome: Secondary | ICD-10-CM | POA: Diagnosis not present

## 2013-06-15 DIAGNOSIS — Z950 Presence of cardiac pacemaker: Secondary | ICD-10-CM | POA: Diagnosis not present

## 2013-06-28 ENCOUNTER — Ambulatory Visit (INDEPENDENT_AMBULATORY_CARE_PROVIDER_SITE_OTHER): Payer: Medicare Other | Admitting: Critical Care Medicine

## 2013-06-28 ENCOUNTER — Encounter: Payer: Self-pay | Admitting: Critical Care Medicine

## 2013-06-28 VITALS — BP 118/64 | HR 69 | Ht 65.5 in | Wt 189.4 lb

## 2013-06-28 DIAGNOSIS — F17201 Nicotine dependence, unspecified, in remission: Secondary | ICD-10-CM

## 2013-06-28 DIAGNOSIS — J4489 Other specified chronic obstructive pulmonary disease: Secondary | ICD-10-CM

## 2013-06-28 DIAGNOSIS — Z87891 Personal history of nicotine dependence: Secondary | ICD-10-CM | POA: Diagnosis not present

## 2013-06-28 DIAGNOSIS — I2510 Atherosclerotic heart disease of native coronary artery without angina pectoris: Secondary | ICD-10-CM | POA: Diagnosis not present

## 2013-06-28 DIAGNOSIS — J449 Chronic obstructive pulmonary disease, unspecified: Secondary | ICD-10-CM | POA: Diagnosis not present

## 2013-06-28 MED ORDER — MOMETASONE FUROATE 50 MCG/ACT NA SUSP: 2.0000 | Freq: Every day | NASAL | Status: DC
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
## 2013-06-28 NOTE — Patient Instructions (Signed)
No change in medications. Return in        6 months        

## 2013-06-29 NOTE — Assessment & Plan Note (Signed)
Gold C Copd improved with smoking cessation Plan No change in inhaled or maintenance medications. Return in  6 months

## 2013-07-04 DIAGNOSIS — M1710 Unilateral primary osteoarthritis, unspecified knee: Secondary | ICD-10-CM | POA: Diagnosis not present

## 2013-07-14 ENCOUNTER — Encounter: Payer: Self-pay | Admitting: Internal Medicine

## 2013-07-25 ENCOUNTER — Encounter: Payer: Self-pay | Admitting: Internal Medicine

## 2013-07-27 ENCOUNTER — Other Ambulatory Visit: Payer: Self-pay | Admitting: *Deleted

## 2013-07-27 MED ORDER — APIXABAN 5 MG PO TABS: 5.0000 mg | ORAL_TABLET | Freq: Two times a day (BID) | ORAL | Status: DC

## 2013-08-02 ENCOUNTER — Telehealth: Payer: Self-pay | Admitting: Critical Care Medicine

## 2013-08-02 ENCOUNTER — Telehealth: Payer: Self-pay | Admitting: *Deleted

## 2013-08-02 ENCOUNTER — Telehealth: Payer: Self-pay | Admitting: Internal Medicine

## 2013-08-02 NOTE — Telephone Encounter (Signed)
Noted will review

## 2013-08-02 NOTE — Telephone Encounter (Signed)
Pt came by to request a referral for pre-operative clearance be filled out by Dr Linna Darner. Please call pt to schedule appt and complete this request.

## 2013-08-02 NOTE — Telephone Encounter (Signed)
Patient just needs to be scheduled with Dr Linna Darner for a preoperative appt

## 2013-08-02 NOTE — Telephone Encounter (Signed)
Have you seen this form? Thanks!

## 2013-08-02 NOTE — Telephone Encounter (Signed)
We received form.  It is from [facility] requesting pre op clearance for tka - medial and lateral w/wo patella resurfacing.  Surgery is scheduled for Oct 16, 2013.  I have placed form in PW's folder to complete and sign.    06/28/13 - last OV with PW; asked to f/u in 6 months. No pending appts

## 2013-08-02 NOTE — Telephone Encounter (Signed)
[facility] Triage Call Report Triage Record Num: [id number] Operator: Flonnie Hailstone Patient Name: [name] Call Date & Time: 07/30/2013 7:54:59PM Patient Phone: [phone] PCP: Adella Hare Patient Gender: Female PCP Fax : Patient DOB: Jul 08, 1931 Practice Name: [facility] Reason for Call: [name]: [name]/Patient; PCP: Other; CB#: [phone]; States takes Eliquis and ordered from mail order. Paid extra to get it overnight but has not received in 5 days. Needs enough to last until gets mail order. Eliquis 5mg  po bid #6 called to [facility]/336 [phone]. Protocol(s) Used: Medication Questions - Adult Recommended Outcome per Protocol: Provided Health Information Reason for Outcome: [name] has medication question(s) that was answered with available resources Care Advice: ~ 05/

## 2013-08-03 NOTE — Telephone Encounter (Signed)
PW has completed Surgery Clearance Form.  He has cleared pt for surgery with the following recommendations to "stay on inhaled med and call prn.'  I have faxed this to [facility] with [place] at [phone] and placed in scan folder.  lmomtcb for pt to inform her of above.

## 2013-08-04 ENCOUNTER — Ambulatory Visit (INDEPENDENT_AMBULATORY_CARE_PROVIDER_SITE_OTHER): Payer: Medicare Other | Admitting: Internal Medicine

## 2013-08-04 ENCOUNTER — Other Ambulatory Visit (INDEPENDENT_AMBULATORY_CARE_PROVIDER_SITE_OTHER): Payer: Medicare Other

## 2013-08-04 ENCOUNTER — Encounter: Payer: Self-pay | Admitting: Internal Medicine

## 2013-08-04 VITALS — BP 100/60 | HR 70 | Temp 98.0°F | Wt 187.4 lb

## 2013-08-04 DIAGNOSIS — IMO0002 Reserved for concepts with insufficient information to code with codable children: Secondary | ICD-10-CM | POA: Diagnosis not present

## 2013-08-04 DIAGNOSIS — M1710 Unilateral primary osteoarthritis, unspecified knee: Secondary | ICD-10-CM | POA: Diagnosis not present

## 2013-08-04 DIAGNOSIS — Z7901 Long term (current) use of anticoagulants: Secondary | ICD-10-CM

## 2013-08-04 DIAGNOSIS — Z87891 Personal history of nicotine dependence: Secondary | ICD-10-CM | POA: Diagnosis not present

## 2013-08-04 DIAGNOSIS — I2510 Atherosclerotic heart disease of native coronary artery without angina pectoris: Secondary | ICD-10-CM

## 2013-08-04 DIAGNOSIS — E1159 Type 2 diabetes mellitus with other circulatory complications: Secondary | ICD-10-CM | POA: Diagnosis not present

## 2013-08-04 DIAGNOSIS — J449 Chronic obstructive pulmonary disease, unspecified: Secondary | ICD-10-CM | POA: Diagnosis not present

## 2013-08-04 DIAGNOSIS — F17201 Nicotine dependence, unspecified, in remission: Secondary | ICD-10-CM

## 2013-08-04 DIAGNOSIS — I4891 Unspecified atrial fibrillation: Secondary | ICD-10-CM | POA: Diagnosis not present

## 2013-08-04 DIAGNOSIS — M1711 Unilateral primary osteoarthritis, right knee: Secondary | ICD-10-CM

## 2013-08-04 LAB — HEMOGLOBIN A1C: Hgb A1c MFr Bld: 6.8 % — ABNORMAL HIGH (ref 4.6–6.5)

## 2013-08-04 NOTE — Progress Notes (Signed)
Subjective:    Patient ID: [name], female    DOB: [birth year], 80 y.o.   MRN: [id number]  HPI  She is here for possible preoperative clearance for degenerative joint disease of the knees. This is present bilaterally but more severe on the right.  She has pain in the right knee with any movement. She is pain-free only with sleep or sitting at rest.  She loves to garden and has been profoundly limited in her ability to do such. She's had to use benches and have the flower beds raised. If she tries to squat she is unable to complete the maneuver. She often has to required help getting up from a sitting position.  Her profoundly complicated past medical history was reviewed.  She has chronic atrial fibrillation and was on warfarin for a prolonged period time. She believes that this was changed to Eliquis in January 2013. She now finds herself in the "doughnut" with a three-month cost of $425 for this medication.  She is due to see Dr. Caryl Comes, Cardiologist in July. She does have a permanent pacemaker. She is in permanent atrial fibrillation despite having undergone radiofrequency ablation in the past.  She has peripheral arterial disease. She had remote angioplasty right iliac vessel and SFA stenting. Dr. Burt Knack  last saw her in November 2014. He felt that she was stable without claudication symptoms. No changes were made in her medicines.  She has stage C. COPD with frequent exacerbations. She has seen her Pulmonologist who has cleared her for surgery with certain restrictions. Dr. Bettina Gavia notes 06/28/13 were reviewed.  She also has obstructive sleep apnea with persistent somnolence. She is on CPAP 10 cm on 2 L of oxygen blended. Her CPAP titration documented that this did provide adequate control  She did stop smoking which helped improve her COPD severity.She states she still craves smoking.   He recommended no change in her inhaled maintenance medications.  She does have diabetes;  her last A1c was 7% in July 2013.There is no home glucose monitoring.        Review of Systems  She does not follow any nutritional program. She admits to eating increased sweets and baked goods recently.  She describes depression as she cares for her husband who has advanced health issues. Her prolonged rehabilitation following her multiple musculoskeletal injuries in 2014 also is a source of depression to her.     Objective:   Physical Exam She appears younger than stated age. She is markedly intelligent and interactive. She has a good knowledge of her medical conditions. She does exhibit rhonchi at the bases w/o increased WOB. Heart rhythm is essentially regular at this time. Pedal pulses are decreased particularly the dorsalis pedis pulses.She has carotid bruits. There are fusiform changes of the knees ,right greater than left with crepitus. There is one half-1+ edema. Gen.: Weight excess but adequately nourished in appearance.  Head: Normocephalic without obvious abnormalities  Eyes: No corneal or conjunctival inflammation noted. Post cataract lens changes. Ears: External  ear exam reveals no significant lesions or deformities.  Nose: External nasal exam reveals no deformity or inflammation. Nasal mucosa are pink and moist. No lesions or exudates noted.   Mouth: Oral mucosa and oropharynx reveal no lesions or exudates.  Neck: No deformities, masses, or tenderness noted. Thyroid normal.. Lungs: Normal respiratory effort; chest expands symmetrically.  Abdomen: Bowel sounds normal; abdomen soft and nontender. No masses, organomegaly or hernias noted.  Musculoskeletal/extremities:  No clubbing or cyanosis noted. Range of motion decreased @ knees. .Tone & strength normal. Hand joints normal Neurologic: Alert and oriented x3. Deep tendon reflexes symmetrical but absent @ knees  Uses cane for ambulation      Skin: Intact without suspicious lesions or  rashes. Lymph: No cervical, axillary Psych: Mood and affect are normal; not clinically depressed. Somewhat anxious about pending surgery & its impact on her ability to garden post op. Normally interactive                                                                                         Assessment & Plan:  #1 end-stage degenerative joint disease of the knees  #2 recurrent atrial fibrillation on oral novel anticoagulant. This could be stopped 24 hours prior to surgery with appropriate anticoagulation started 24 hours after the surgery if authorized by her Cardiologist Dr. Caryl Comes.  #3 advanced COPD; [name] has cleared her for surgery with certain restrictions  #4 diabetes, question will status. A1c will be checked.

## 2013-08-04 NOTE — Telephone Encounter (Signed)
Pt aware.  Will contact our office if anything further needed

## 2013-08-04 NOTE — Patient Instructions (Signed)
Your next office appointment will be determined based upon review of your pending labs . Those instructions will be transmitted to you  by mail.

## 2013-08-04 NOTE — Progress Notes (Signed)
Pre visit review using our clinic review tool, if applicable. No additional management support is needed unless otherwise documented below in the visit note. 

## 2013-08-09 ENCOUNTER — Other Ambulatory Visit: Payer: Self-pay | Admitting: *Deleted

## 2013-08-09 MED ORDER — METFORMIN HCL 500 MG PO TABS: 500.0000 mg | ORAL_TABLET | Freq: Two times a day (BID) | ORAL | Status: DC

## 2013-08-29 ENCOUNTER — Other Ambulatory Visit: Payer: Self-pay

## 2013-08-29 MED ORDER — ATORVASTATIN CALCIUM 20 MG PO TABS: 20.0000 mg | ORAL_TABLET | Freq: Every day | ORAL | Status: DC

## 2013-08-29 MED ORDER — TORSEMIDE 10 MG PO TABS: 10.0000 mg | ORAL_TABLET | Freq: Every day | ORAL | Status: DC

## 2013-08-31 NOTE — Telephone Encounter (Signed)
Scheduled appt.

## 2013-09-05 ENCOUNTER — Encounter: Payer: Self-pay | Admitting: Physician Assistant

## 2013-09-05 ENCOUNTER — Ambulatory Visit (INDEPENDENT_AMBULATORY_CARE_PROVIDER_SITE_OTHER): Payer: Medicare Other | Admitting: Physician Assistant

## 2013-09-05 ENCOUNTER — Other Ambulatory Visit (INDEPENDENT_AMBULATORY_CARE_PROVIDER_SITE_OTHER): Payer: Medicare Other

## 2013-09-05 ENCOUNTER — Encounter: Payer: Self-pay | Admitting: Internal Medicine

## 2013-09-05 ENCOUNTER — Ambulatory Visit (INDEPENDENT_AMBULATORY_CARE_PROVIDER_SITE_OTHER): Payer: Medicare Other | Admitting: Internal Medicine

## 2013-09-05 VITALS — BP 108/60 | HR 70 | Temp 98.1°F | Wt 190.6 lb

## 2013-09-05 VITALS — BP 97/60 | HR 70 | Ht 65.5 in | Wt 188.0 lb

## 2013-09-05 DIAGNOSIS — I2510 Atherosclerotic heart disease of native coronary artery without angina pectoris: Secondary | ICD-10-CM | POA: Diagnosis not present

## 2013-09-05 DIAGNOSIS — G5601 Carpal tunnel syndrome, right upper limb: Secondary | ICD-10-CM

## 2013-09-05 DIAGNOSIS — I6529 Occlusion and stenosis of unspecified carotid artery: Secondary | ICD-10-CM | POA: Diagnosis not present

## 2013-09-05 DIAGNOSIS — I658 Occlusion and stenosis of other precerebral arteries: Secondary | ICD-10-CM

## 2013-09-05 DIAGNOSIS — Z950 Presence of cardiac pacemaker: Secondary | ICD-10-CM

## 2013-09-05 DIAGNOSIS — Z8673 Personal history of transient ischemic attack (TIA), and cerebral infarction without residual deficits: Secondary | ICD-10-CM

## 2013-09-05 DIAGNOSIS — I4891 Unspecified atrial fibrillation: Secondary | ICD-10-CM

## 2013-09-05 DIAGNOSIS — M175 Other unilateral secondary osteoarthritis of knee: Secondary | ICD-10-CM | POA: Diagnosis not present

## 2013-09-05 DIAGNOSIS — I5032 Chronic diastolic (congestive) heart failure: Secondary | ICD-10-CM

## 2013-09-05 DIAGNOSIS — I480 Paroxysmal atrial fibrillation: Secondary | ICD-10-CM

## 2013-09-05 DIAGNOSIS — M1731 Unilateral post-traumatic osteoarthritis, right knee: Secondary | ICD-10-CM

## 2013-09-05 DIAGNOSIS — I739 Peripheral vascular disease, unspecified: Secondary | ICD-10-CM

## 2013-09-05 DIAGNOSIS — J449 Chronic obstructive pulmonary disease, unspecified: Secondary | ICD-10-CM | POA: Diagnosis not present

## 2013-09-05 DIAGNOSIS — G4733 Obstructive sleep apnea (adult) (pediatric): Secondary | ICD-10-CM | POA: Diagnosis not present

## 2013-09-05 DIAGNOSIS — E785 Hyperlipidemia, unspecified: Secondary | ICD-10-CM

## 2013-09-05 DIAGNOSIS — Z01810 Encounter for preprocedural cardiovascular examination: Secondary | ICD-10-CM | POA: Diagnosis not present

## 2013-09-05 DIAGNOSIS — G5600 Carpal tunnel syndrome, unspecified upper limb: Secondary | ICD-10-CM

## 2013-09-05 DIAGNOSIS — J4489 Other specified chronic obstructive pulmonary disease: Secondary | ICD-10-CM

## 2013-09-05 DIAGNOSIS — E1159 Type 2 diabetes mellitus with other circulatory complications: Secondary | ICD-10-CM

## 2013-09-05 DIAGNOSIS — I4819 Other persistent atrial fibrillation: Secondary | ICD-10-CM

## 2013-09-05 DIAGNOSIS — I6523 Occlusion and stenosis of bilateral carotid arteries: Secondary | ICD-10-CM

## 2013-09-05 DIAGNOSIS — I509 Heart failure, unspecified: Secondary | ICD-10-CM

## 2013-09-05 LAB — TSH: TSH: 2.02 u[IU]/mL (ref 0.35–4.50)

## 2013-09-05 NOTE — Patient Instructions (Addendum)
Your physician has requested that you have a lexiscan myoview DX [name] [place]; PER SCOTT WEAVER, PAC AND DR. KLEIN THIS NEEDS TO BE DONE BEFORE DR. KLEIN APPT ON 7/7. For further information please visit [email or web address]. Please follow instruction sheet, as given.  NO CHANGES WERE MADE TODAY  KEEP YOUR FOLLOW UP WITH DR. Caryl Comes 09/12/13  HOLD 3 DOSES OF ELQUIS BEFORE YOUR SURGERY

## 2013-09-05 NOTE — Assessment & Plan Note (Signed)
Cardiology clearance required

## 2013-09-05 NOTE — Progress Notes (Signed)
Pre visit review using our clinic review tool, if applicable. No additional management support is needed unless otherwise documented below in the visit note. 

## 2013-09-05 NOTE — Assessment & Plan Note (Signed)
A1c indicates controlled DM

## 2013-09-05 NOTE — Progress Notes (Signed)
   Subjective:    Patient ID: [name], female    DOB: 1931-07-25, 80 y.o.   MRN: [id number]  HPI  She is tentatively scheduled for right total knee replacement by Dr. Maureen Ralphs in early August of this year.  She has several comorbidities which will need to be assessed prior to that surgical clearance. She last saw [name] for Gold Stage C COPD with frequent exacerbations 06/28/13. At that time he felt she had  improved since discontinuing smoking in November 2013  She has  atrial fib and was last seen by cardiology 09/28/11. She is on beta blocker & Eliquis. It is also noted she is on Pletal. PMH includes nodal ablation.  She does have telemetric monitoring of pacer every 4-6 months.  She is a followup cardiology appointment today at 3:00 with Richardson Dopp, Cardiology PA.    Review of Systems   Chest pain, palpitations, tachycardia,  paroxysmal nocturnal dyspnea, or claudication absent . Edema only with salt intake. No DOE as she is sedentary ; no rescue inhaler use. Intermittent sputum production. Compliant with CPAP.  FBS not monitored . Burning in hands which awakens her.CTS diagnosed; splints no longer felpful.         Objective:   Physical Exam  Significant or distinguishing  findings on physical exam are documented first.  Below that are other systems examined & findings.   She is profoundly debilitated; she is helped simply getting out of a chair. She is unable to get on the exam table. She ambulates slowly and deliberately using a 4 pronged cane. She has complete dentures Rhonchi are noted diffusely in the lungs;but  there is no increased work of breathing Abdomen is protuberant without organomegaly or masses. She has trace edema left lower extremity. There is fusiform changes the right knee with crepitus.  General appearance :adequate nourishment w/o distress. Eyes: No conjunctival inflammation or scleral icterus is present. Oral exam:  lips and gums are  healthy appearing.There is no oropharyngeal erythema or exudate noted.  Heart:  Normal rate and regular rhythm. S1 and S2 normal without gallop, murmur, click, rub or other extra sounds   Lungs:No increased work of breathing.  Abdomen: bowel sounds normal, soft and non-tender without masses, organomegaly or hernias noted.  No  tenderness over the flanks to percussion Skin:Warm & dry.  Intact without suspicious lesions or rashes ; no jaundice or tenting Lymphatic: No lymphadenopathy is noted about the head, neck, axilla               Assessment & Plan:  See Current Assessment & Plan in Problem List under specific Diagnosis

## 2013-09-05 NOTE — Assessment & Plan Note (Signed)
Apparently cleared by Dr Joya Gaskins

## 2013-09-05 NOTE — Progress Notes (Signed)
Cardiology Office Note   Date:  09/05/2013   ID:  Afsa, Meany 02-Aug-1931, MRN [id number]  PCP:  Unice Cobble, MD  Cardiologist:  Dr. [name]   PV:  Dr. Sherren Mocha    History of Present Illness: [name] is a 80 y.o. female with a hx of non-obstructive [name], diastolic CHF, AFib s/p PVI ablation x 2 ([place] and [facility]), recurrent AFlutter s/p multiple DCCVs and subsequent AV node ablation and pacemaker, COPD, prior CVA, breast CA s/p lumpectomy, PAD s/p remote tibial angioplasty and right iliac and SFA stenting, diabetes, sleep apnea.  She has a hx of Amiodarone pulmonary toxicity.  She presents for surgical clearance.  She needs R TKR with Dr. Wynelle Link in August.  She is limited in her activity and cannot achieve 4 METs.  She denies chest pain. She does note DOE.  She is probably NYHA class 2b.  Denies orthopnea, PND.  She has some LE edema without significant change.  Denies syncope.       Studies:  - LHC (12/2002):  Mid LAD 40%, prox AV CFX 30%, irreg in RCA up to 30%, EF 50%.    - Chest CT (05/2009):  Calcified atherosclerotic disease in LAD and CFX - coronary arteries patent  - Echo (06/2007):  EF 50-55%, mild asc aortic dilation, mod MR, mod LAE, mod TR, mod RAE.  - Carotid US (01/2013):  R 40-59%, L 1-39% - f/u 1 yr.  - Nuclear (02/2009 at [place]):  Normal  Recent Labs: 05/24/2013: ALT 12; Creatinine 0.8; Hemoglobin 12.1; Potassium 4.4   Wt Readings from Last 3 Encounters:  09/05/13 188 lb (85.276 kg)  09/05/13 190 lb 9.6 oz (86.456 kg)  08/04/13 187 lb 6.4 oz (85.004 kg)     Past Medical History  Diagnosis Date  . CVA (cerebral vascular accident)   . Sleep apnea     associated with hypersomnia  . Amiodarone pulmonary toxicity   . Chronic renal insufficiency, stage III (moderate)     CrCl about 60 ml/min  . Diabetes   . Tobacco abuse   . Diastolic heart failure     Acute on Chronic  . Pacemaker     Permanent  . AF (atrial fibrillation)        AV ablation 9/09 [place] per Dr Ola Spurr - AV node ablation 9/11 Dr Caryl Comes  . Hyperlipidemia   . GERD (gastroesophageal reflux disease)   . [name] (coronary artery disease)     (not sure of this 11/10  . COPD (chronic obstructive pulmonary disease)     emphysema -FeV1 73% DLCO 53% 5/09  . PAD (peripheral artery disease)     w/hx right iliac/SFA stenting and left leg PTA  . Invasive ductal carcinoma of breast 2011    LEFT   . OA (osteoarthritis) of knee     RIGHT  . Right sided sciatica   . Sinoatrial node dysfunction   . CHF (congestive heart failure)   . Mental disorder   . Acute blood loss anemia 04/20/2012  . Depression 06/06/2012    Current Outpatient Prescriptions  Medication Sig Dispense Refill  . apixaban (ELIQUIS) 5 MG TABS tablet Take 1 tablet (5 mg total) by mouth 2 (two) times daily.  180 tablet  0  . atenolol (TENORMIN) 25 MG tablet Take 25 mg by mouth daily.      Marland Kitchen atorvastatin (LIPITOR) 20 MG tablet Take 1 tablet (20 mg total) by mouth daily.  90 tablet  1  . cholecalciferol (VITAMIN D) 1000 UNITS tablet Take 1 tablet (1,000 Units total) by mouth daily.  100 tablet  3  . cilostazol (PLETAL) 100 MG tablet TAKE 1 TABLET  AT BEDTIME. AND AGAIN AFTER BREAKFAST IF REMEMBERS  180 tablet  2  . Cyanocobalamin (VITAMIN B-12 CR PO) Take 1 tablet by mouth daily.      . Fluticasone-Salmeterol (ADVAIR) 250-50 MCG/DOSE AEPB Inhale 1 puff into the lungs 2 (two) times daily.  3 each  1  . letrozole (FEMARA) 2.5 MG tablet Take 1 tablet (2.5 mg total) by mouth daily.  90 tablet  0  . levalbuterol (XOPENEX HFA) 45 MCG/ACT inhaler Inhale 2 puffs into the lungs every 6 (six) hours as needed. For shortness of breath      . magnesium oxide (MAG-OX) 400 (241.3 MG) MG tablet Take 0.5 tablets (200 mg total) by mouth daily.  30 tablet  11  . metFORMIN (GLUCOPHAGE) 500 MG tablet Take 1 tablet (500 mg total) by mouth 2 (two) times daily with a meal.  180 tablet  0  . mometasone (NASONEX) 50  MCG/ACT nasal spray Place 2 sprays into the nose daily.  51 g  4  . potassium chloride SA (K-DUR,KLOR-CON) 20 MEQ tablet Take 3 tablets in morning and Take 2 tablets every evening      . tiotropium (SPIRIVA HANDIHALER) 18 MCG inhalation capsule Place 1 capsule (18 mcg total) into inhaler and inhale daily.  30 capsule  4  . torsemide (DEMADEX) 10 MG tablet Take 1 tablet (10 mg total) by mouth daily.  90 tablet  0   No current facility-administered medications for this visit.    Allergies:   Cholestatin and Sulfur   Social History:  The patient  reports that she quit smoking about 20 months ago. Her smoking use included Cigarettes. She has a 55 pack-year smoking history. She has never used smokeless tobacco. She reports that she drinks alcohol. She reports that she does not use illicit drugs.   Family History:  The patient's family history includes Heart disease in her father.   ROS:  Please see the history of present illness.      All other systems reviewed and negative.   PHYSICAL EXAM: VS:  BP 97/60  Pulse 70  Ht 5' 5.5" (1.664 m)  Wt 188 lb (85.276 kg)  BMI 30.80 kg/m2 Well nourished, well developed, in no acute distress HEENT: normal Neck: no JVD Cardiac:  normal S1, S2; RRR; no murmur Lungs:  clear to auscultation bilaterally, no wheezing, rhonchi or rales Abd: soft, nontender, no hepatomegaly Ext: no edema Skin: warm and dry Neuro:  CNs 2-12 intact, no focal abnormalities noted  EKG:  AV paced, HR 70      ASSESSMENT AND PLAN:  1. Pre-operative cardiovascular examination:  She has a hx of non-obstructive [name] by cath many years ago.  She denies chest pain or significant dyspnea.  However, she is limited in her mobility and cannot achieve 4 METs.  I reviewed her case with Dr. [name].  We have recommended proceeding with [facility] for risk stratification.  2. [name].  She is not on ASA as she is on Eliquis.  Continue statin.  Proceed with myoview as noted.   3. Persistent atrial fibrillation:  She appears to be in NSR today.  Continue Eliquis.  She may hold her Eliquis x 3 doses at the time of her surgery and resume post op when felt to be safe  by the surgeon.  4. Carotid stenosis, bilateral:  F/u carotid US due in 01/2014. 5. History of stroke:  She does not need Lovenox bridging as she will be on Eliquis up until 36 hours before her surgery.   6. Diastolic CHF, chronic:  Volume appears stable.  Continue current Rx. 7. [name]: f/u with Dr. Sherren Mocha as planned.  8. Chronic obstructive pulmonary disease, unspecified COPD, unspecified chronic bronchitis type:  F/u with pulmonary as planned.  9. HYPERLIPIDEMIA:  Continue statin.  10. PACEMAKER, PERMANENT:  F/u with Dr. [name] as planned.  11. Disposition:  F/u with Dr. [name] 7/7 as planned.  Further recommendations to follow after results of stress testing is known.   Signed, Versie Starks, MHS 09/05/2013 3:35 PM    [facility] [place], [place], [place]  [address] Phone: [phone]; Fax: [phone]

## 2013-09-05 NOTE — Patient Instructions (Addendum)
Your next office appointment will be determined based upon review of your pending labs. Those instructions will be transmitted to you through My Chart . Preoperative clearance is dependent on opinion of Cardiology & Pulmonary as they would need to address any potential peri operative complications. She is cleared in regards to her Diabetes.

## 2013-09-05 NOTE — Assessment & Plan Note (Signed)
TSH 

## 2013-09-06 ENCOUNTER — Ambulatory Visit (HOSPITAL_COMMUNITY): Payer: Medicare Other | Attending: Cardiology | Admitting: Radiology

## 2013-09-06 ENCOUNTER — Encounter: Payer: Self-pay | Admitting: Cardiology

## 2013-09-06 VITALS — BP 129/58 | HR 70 | Ht 65.0 in | Wt 188.0 lb

## 2013-09-06 DIAGNOSIS — I4892 Unspecified atrial flutter: Secondary | ICD-10-CM | POA: Insufficient documentation

## 2013-09-06 DIAGNOSIS — I4891 Unspecified atrial fibrillation: Secondary | ICD-10-CM | POA: Insufficient documentation

## 2013-09-06 DIAGNOSIS — Z01810 Encounter for preprocedural cardiovascular examination: Secondary | ICD-10-CM

## 2013-09-06 DIAGNOSIS — I2510 Atherosclerotic heart disease of native coronary artery without angina pectoris: Secondary | ICD-10-CM | POA: Insufficient documentation

## 2013-09-06 DIAGNOSIS — I509 Heart failure, unspecified: Secondary | ICD-10-CM | POA: Diagnosis not present

## 2013-09-06 MED ORDER — REGADENOSON 0.4 MG/5ML IV SOLN
0.4000 mg | Freq: Once | INTRAVENOUS | Status: AC
  Administered 2013-09-06: 0.4 mg via INTRAVENOUS

## 2013-09-06 MED ORDER — TECHNETIUM TC 99M SESTAMIBI GENERIC - CARDIOLITE
11.0000 | Freq: Once | INTRAVENOUS | Status: AC | PRN
  Administered 2013-09-06: 11 via INTRAVENOUS

## 2013-09-06 MED ORDER — TECHNETIUM TC 99M SESTAMIBI GENERIC - CARDIOLITE
33.0000 | Freq: Once | INTRAVENOUS | Status: AC | PRN
  Administered 2013-09-06: 33 via INTRAVENOUS

## 2013-09-06 NOTE — Progress Notes (Signed)
[facility] [address] [place], [place] [address] [phone]    Cardiology Nuclear Med Study  [name] is a 80 y.o. female     MRN : [id number]     DOB: 03/02/1932  Procedure Date: 09/06/2013  Nuclear Med Background Indication for Stress Test:  Evaluation for Ischemia, and Surgical Clearance for  (R) TKR on 10-2013 by Lina Sar History:  CD, Cath (n/o [name]), Afib (cardioversion), hx. Aflutter/CHF, Echo  2009 EF 50-55%, MPI 2010 (normal) , COPD Cardiac Risk Factors: CVA, Family History - [name], History of Smoking, Lipids, NIDDM and [name]  Symptoms:  None indicated   Nuclear Pre-Procedure Caffeine/Decaff Intake:  None> 12 hrs NPO After: 9:30pm   Lungs:  clear O2 Sat: 91% on room air. IV 0.9% NS with Angio Cath:  22g  IV Site: R Hand x 1, tolerated well IV Started by:  Irven Baltimore, RN  Chest Size (in):  38 Cup Size:  B  Height: 5\' 5"  (1.651 m)  Weight:  188 lb (85.276 kg)  BMI:  Body mass index is 31.28 kg/(m^2). Tech Comments:  Patient held Torsemide and Metformin this am. Irven Baltimore, RN.    Nuclear Med Study 1 or 2 day study: 1 day  Stress Test Type:  Carlton Adam  Reading MD: N/A  Order Authorizing Provider:  [name], MD, and Richardson Dopp, [facility]  Resting Radionuclide: Technetium 66m Sestamibi  Resting Radionuclide Dose: 11.0 mCi   Stress Radionuclide:  Technetium 83m Sestamibi  Stress Radionuclide Dose: 33.0 mCi           Stress Protocol Rest HR: 70 Stress HR: 70  Rest BP: 129/58 Stress BP: 92/52  Exercise Time (min): n/a METS: n/a           Dose of Adenosine (mg):  n/a Dose of Lexiscan: 0.4 mg  Dose of Atropine (mg): n/a Dose of Dobutamine: n/a mcg/kg/min (at max HR)  Stress Test Technologist: Glade Lloyd, BS-ES  Nuclear Technologist:  Charlton Amor, CNMT     Rest Procedure:  Myocardial perfusion imaging was performed at rest 45 minutes following the intravenous administration of Technetium 46m Sestamibi. Rest  ECG: Paced rhythm  Stress Procedure:  The patient received IV Lexiscan 0.4 mg over 15-seconds.  Technetium 22m Sestamibi injected at 30-seconds.  Quantitative spect images were obtained after a 45 minute delay.  During the infusion of Lexiscan the patient complained of SOB.  This resolved in recovery.  Stress ECG: No significant change from baseline ECG  QPS Raw Data Images:  Images were obtained with both arms down. There is interference from nuclear activity from structures below the diaphragm. Stress Images:  There is a moderate size area of decreased activity affecting the mid/apical inferior segments and the base/mid inferolateral segments. I believe this is similar with stress and rest. This finding may be created by the interference from nuclear activity from structures below the diaphragm. There may also be diaphragmatic attenuation. Rest Images:  The rest images similar to the stress images. Subtraction (SDS):  There is no significant reversibility. Transient Ischemic Dilatation (Normal <1.22):  1.16 Lung/Heart Ratio (Normal <0.45):  0.49  Quantitative Gated Spect Images QGS EDV:  84 ml QGS ESV:  28 ml  Impression Exercise Capacity:  Lexiscan with no exercise. BP Response:  Normal blood pressure response. Clinical Symptoms:  Shortness of breath ECG Impression:  No significant ST segment change suggestive of ischemia. Comparison with Prior Nuclear Study: No images to compare  Overall Impression:  This is a low-risk scan. There is question of a mild fixed defect affecting parts of the inferior wall and inferolateral wall. However there is interference from nuclear activity from structures below the diaphragm. I believe that this interference causes the defects seen on the images. There is no definite ischemia. There is excellent wall motion. This argues against significant scar.  LV Ejection Fraction: 67%.  LV Wall Motion:  Wall motion is normal.  Dola Argyle,  MD

## 2013-09-07 ENCOUNTER — Encounter: Payer: Self-pay | Admitting: Physician Assistant

## 2013-09-11 ENCOUNTER — Other Ambulatory Visit: Payer: Self-pay | Admitting: Internal Medicine

## 2013-09-11 ENCOUNTER — Telehealth: Payer: Self-pay | Admitting: *Deleted

## 2013-09-11 NOTE — Telephone Encounter (Signed)
lmom myoview low risk per Brynda Rim. PA. Keep f/u w/Dr. Caryl Comes as planned, if any questions tcb [phone].

## 2013-09-12 ENCOUNTER — Telehealth: Payer: Self-pay

## 2013-09-12 ENCOUNTER — Encounter: Payer: Self-pay | Admitting: Internal Medicine

## 2013-09-12 ENCOUNTER — Ambulatory Visit (INDEPENDENT_AMBULATORY_CARE_PROVIDER_SITE_OTHER): Payer: Medicare Other | Admitting: Internal Medicine

## 2013-09-12 VITALS — BP 121/65 | HR 70 | Ht 65.0 in | Wt 192.0 lb

## 2013-09-12 DIAGNOSIS — I495 Sick sinus syndrome: Secondary | ICD-10-CM | POA: Diagnosis not present

## 2013-09-12 DIAGNOSIS — I482 Chronic atrial fibrillation, unspecified: Secondary | ICD-10-CM

## 2013-09-12 DIAGNOSIS — I2510 Atherosclerotic heart disease of native coronary artery without angina pectoris: Secondary | ICD-10-CM

## 2013-09-12 DIAGNOSIS — I4891 Unspecified atrial fibrillation: Secondary | ICD-10-CM | POA: Diagnosis not present

## 2013-09-12 DIAGNOSIS — Z950 Presence of cardiac pacemaker: Secondary | ICD-10-CM | POA: Diagnosis not present

## 2013-09-12 LAB — MDC_IDC_ENUM_SESS_TYPE_INCLINIC
Battery Impedance: 5700 Ohm
Battery Voltage: 2.74 V
Brady Statistic RA Percent Paced: 91 %
Brady Statistic RV Percent Paced: 99 %
Date Time Interrogation Session: 20150707175056
Implantable Pulse Generator Model: 5386
Implantable Pulse Generator Serial Number: 1409809
Lead Channel Pacing Threshold Amplitude: 0.5 V
Lead Channel Pacing Threshold Pulse Width: 0.4 ms
Lead Channel Setting Pacing Amplitude: 2 V
Lead Channel Setting Pacing Pulse Width: 0.5 ms
Lead Channel Setting Sensing Sensitivity: 4 mV
MDC IDC MSMT LEADCHNL RA IMPEDANCE VALUE: 484 Ohm
MDC IDC MSMT LEADCHNL RV IMPEDANCE VALUE: 518 Ohm

## 2013-09-12 NOTE — Telephone Encounter (Signed)
Message copied by Shelly Coss on Tue Sep 12, 2013  8:24 AM ------      Message from: Hendricks Limes      Created: Mon Sep 11, 2013  6:06 PM       Please STOP generic Pletal. It is not necessary as you are on Eliquis ------
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
## 2013-09-12 NOTE — Patient Instructions (Signed)
Your physician recommends that you continue on your current medications as directed. Please refer to the Current Medication list given to you today.  Your physician wants you to follow-up in: 6 months with device clinic.  You will receive a reminder letter in the mail two months in advance. If you don't receive a letter, please call our office to schedule the follow-up appointment.  Your physician wants you to follow-up in: 1 year with Dr. Klein.  You will receive a reminder letter in the mail two months in advance. If you don't receive a letter, please call our office to schedule the follow-up appointment.   

## 2013-09-12 NOTE — Telephone Encounter (Signed)
Attempted to notify, no answer, no voicemail

## 2013-09-13 ENCOUNTER — Telehealth: Payer: Self-pay | Admitting: Internal Medicine

## 2013-09-13 NOTE — Telephone Encounter (Signed)
Walk in pt Form " [facility]" paper dropped Off gave to [facility] 7.8.15/km

## 2013-09-13 NOTE — Telephone Encounter (Signed)
I spoke to patient's husband who will advise patient to stop generic Pletal since she is on Eliquis

## 2013-09-14 ENCOUNTER — Telehealth: Payer: Self-pay | Admitting: Internal Medicine

## 2013-09-14 NOTE — Telephone Encounter (Signed)
States someone called her about her Eliquis and another medication. I will review her med list with pharmacy on Monday, when I am back in the office, and call her back with findings.  She is agreeable to this.

## 2013-09-14 NOTE — Telephone Encounter (Signed)
New message     Pt states a nurse called her several days ago.  He husband took the message but he has dementia and did not relay the message clearly.  Please call pt and if you get husband--call back

## 2013-09-15 DIAGNOSIS — M1710 Unilateral primary osteoarthritis, unspecified knee: Secondary | ICD-10-CM | POA: Diagnosis not present

## 2013-09-21 ENCOUNTER — Encounter: Payer: Self-pay | Admitting: Internal Medicine

## 2013-09-25 ENCOUNTER — Encounter: Payer: Self-pay | Admitting: Internal Medicine

## 2013-09-25 ENCOUNTER — Other Ambulatory Visit: Payer: Self-pay | Admitting: *Deleted

## 2013-09-25 DIAGNOSIS — Z01818 Encounter for other preprocedural examination: Secondary | ICD-10-CM

## 2013-09-25 DIAGNOSIS — I495 Sick sinus syndrome: Secondary | ICD-10-CM

## 2013-09-25 DIAGNOSIS — I480 Paroxysmal atrial fibrillation: Secondary | ICD-10-CM

## 2013-09-25 NOTE — Telephone Encounter (Signed)
Chauncey Reading [name] at 09/25/2013 10:20 AM    Status: Signed       New message  Returning a nurses call. Pt want to make sure Dr Caryl Comes faxed her clearance to Dr Maureen Ralphs

## 2013-09-25 NOTE — Telephone Encounter (Signed)
This encounter was created in error - please disregard.

## 2013-09-25 NOTE — Telephone Encounter (Signed)
New message     Returning a nurses call.  Pt want to make sure Dr Caryl Comes faxed her clearance to Dr Maureen Ralphs

## 2013-09-25 NOTE — Telephone Encounter (Signed)
Returned pt call - discussed need for echo prior to clearance for ortho surgery. Will send to [facility] to schedule, pt aware office will contact her to arrange.

## 2013-09-26 ENCOUNTER — Other Ambulatory Visit: Payer: Self-pay | Admitting: Internal Medicine

## 2013-09-26 NOTE — Telephone Encounter (Signed)
This needs to come from Cardiology; I did not Rx it.

## 2013-09-27 ENCOUNTER — Ambulatory Visit (HOSPITAL_COMMUNITY): Payer: Medicare Other | Attending: Internal Medicine | Admitting: Radiology

## 2013-09-27 DIAGNOSIS — I359 Nonrheumatic aortic valve disorder, unspecified: Secondary | ICD-10-CM | POA: Diagnosis not present

## 2013-09-27 DIAGNOSIS — I4891 Unspecified atrial fibrillation: Secondary | ICD-10-CM | POA: Diagnosis not present

## 2013-09-27 DIAGNOSIS — I517 Cardiomegaly: Secondary | ICD-10-CM | POA: Insufficient documentation

## 2013-09-27 DIAGNOSIS — I739 Peripheral vascular disease, unspecified: Secondary | ICD-10-CM | POA: Insufficient documentation

## 2013-09-27 DIAGNOSIS — E785 Hyperlipidemia, unspecified: Secondary | ICD-10-CM | POA: Diagnosis not present

## 2013-09-27 DIAGNOSIS — Z01810 Encounter for preprocedural cardiovascular examination: Secondary | ICD-10-CM

## 2013-09-27 DIAGNOSIS — I480 Paroxysmal atrial fibrillation: Secondary | ICD-10-CM

## 2013-09-27 DIAGNOSIS — Z01818 Encounter for other preprocedural examination: Secondary | ICD-10-CM

## 2013-09-27 DIAGNOSIS — I079 Rheumatic tricuspid valve disease, unspecified: Secondary | ICD-10-CM | POA: Diagnosis not present

## 2013-09-27 DIAGNOSIS — I2510 Atherosclerotic heart disease of native coronary artery without angina pectoris: Secondary | ICD-10-CM | POA: Diagnosis not present

## 2013-09-27 DIAGNOSIS — I495 Sick sinus syndrome: Secondary | ICD-10-CM

## 2013-09-27 DIAGNOSIS — I509 Heart failure, unspecified: Secondary | ICD-10-CM | POA: Diagnosis not present

## 2013-09-27 DIAGNOSIS — G4733 Obstructive sleep apnea (adult) (pediatric): Secondary | ICD-10-CM | POA: Diagnosis not present

## 2013-09-27 DIAGNOSIS — Z8673 Personal history of transient ischemic attack (TIA), and cerebral infarction without residual deficits: Secondary | ICD-10-CM | POA: Insufficient documentation

## 2013-09-27 DIAGNOSIS — Z87891 Personal history of nicotine dependence: Secondary | ICD-10-CM | POA: Diagnosis not present

## 2013-09-27 NOTE — Progress Notes (Signed)
Echocardiogram performed.  

## 2013-10-02 ENCOUNTER — Telehealth: Payer: Self-pay | Admitting: Internal Medicine

## 2013-10-02 NOTE — Progress Notes (Signed)
Need orders in EPIC please - pt COMING FOR PREOP MON 10/09/13 - thank you

## 2013-10-02 NOTE — Telephone Encounter (Signed)
Follow up:    Pt called for 7/22 Echo test results please give her a call.  Pt needs a call back today if possible.

## 2013-10-02 NOTE — Telephone Encounter (Signed)
Patient notified of ECHO results and comment by Dr. Caryl Comes that results were "normal - Great news!!!". Patient verbalized appreciation and understanding with no questions or concerns.

## 2013-10-03 ENCOUNTER — Other Ambulatory Visit: Payer: Self-pay | Admitting: Internal Medicine

## 2013-10-03 ENCOUNTER — Other Ambulatory Visit: Payer: Self-pay | Admitting: Orthopedic Surgery

## 2013-10-03 NOTE — Telephone Encounter (Signed)
Not maintenance med; would need OV . [place] Cerise, I am not her PCP

## 2013-10-04 ENCOUNTER — Other Ambulatory Visit: Payer: Self-pay | Admitting: *Deleted

## 2013-10-04 MED ORDER — FLUTICASONE-SALMETEROL 250-50 MCG/DOSE IN AEPB
1.0000 | INHALATION_SPRAY | Freq: Two times a day (BID) | RESPIRATORY_TRACT | Status: DC
Start: 2013-10-04 — End: 2014-05-14

## 2013-10-05 ENCOUNTER — Encounter (HOSPITAL_COMMUNITY): Payer: Self-pay | Admitting: Pharmacy Technician

## 2013-10-06 ENCOUNTER — Encounter: Payer: Self-pay | Admitting: Internal Medicine

## 2013-10-06 ENCOUNTER — Ambulatory Visit (INDEPENDENT_AMBULATORY_CARE_PROVIDER_SITE_OTHER): Payer: Medicare Other | Admitting: Internal Medicine

## 2013-10-06 ENCOUNTER — Telehealth: Payer: Self-pay | Admitting: Internal Medicine

## 2013-10-06 ENCOUNTER — Telehealth: Payer: Self-pay | Admitting: *Deleted

## 2013-10-06 ENCOUNTER — Other Ambulatory Visit (HOSPITAL_COMMUNITY): Payer: Self-pay | Admitting: *Deleted

## 2013-10-06 ENCOUNTER — Encounter (HOSPITAL_COMMUNITY): Payer: Self-pay

## 2013-10-06 VITALS — BP 104/62 | HR 70 | Temp 98.1°F | Wt 188.1 lb

## 2013-10-06 DIAGNOSIS — I872 Venous insufficiency (chronic) (peripheral): Secondary | ICD-10-CM | POA: Diagnosis not present

## 2013-10-06 DIAGNOSIS — R609 Edema, unspecified: Secondary | ICD-10-CM

## 2013-10-06 DIAGNOSIS — E8809 Other disorders of plasma-protein metabolism, not elsewhere classified: Secondary | ICD-10-CM | POA: Diagnosis not present

## 2013-10-06 DIAGNOSIS — I2510 Atherosclerotic heart disease of native coronary artery without angina pectoris: Secondary | ICD-10-CM | POA: Diagnosis not present

## 2013-10-06 NOTE — Patient Instructions (Addendum)
[name]  10/06/2013                           YOUR PROCEDURE IS SCHEDULED ON: 10/16/13 AT 8:20 AM               ENTER [name] TO SHORT STAY CENTER                 ARRIVE AT SHORT STAY AT: 5:15 AM               CALL THIS NUMBER IF ANY PROBLEMS THE DAY OF SURGERY :               [phone]                                REMEMBER:   Do not eat food or drink liquids AFTER MIDNIGHT                  Take these medicines the morning of surgery with               A SIPS OF WATER :  ATENOLOL  / USE ADVAIR AND SPIRIVA INHALERS        Do not wear jewelry, make-up   Do not wear lotions, powders, or perfumes.   Do not shave legs or underarms 12 hrs. before surgery (men may shave face)  Do not bring valuables to the hospital.  Contacts, dentures or bridgework may not be worn into surgery.  Leave suitcase in the car. After surgery it may be brought to your room.  For patients admitted to the hospital more than one night, checkout time is            11:00 AM                                         ________________________________________________________________________                 [name]  Before surgery, you can play an important role.  Because skin is not sterile, your skin needs to be as free of germs as possible.  You can reduce the number of germs on your skin by washing with CHG (chlorahexidine gluconate) soap before surgery.  CHG is an antiseptic cleaner which kills germs and bonds with the skin to continue killing germs even after washing. Please DO NOT use if you have an allergy to CHG or antibacterial soaps.  If your skin becomes reddened/irritated stop using the CHG and inform your nurse when you arrive at Short Stay. Do not shave (including legs and underarms) for at least  48 hours prior to the first CHG shower.  You may shave your face. Please follow these instructions carefully:   1.  Shower with CHG Soap the night before surgery and the  morning of Surgery.   2.  If you choose to wash your hair, wash your hair first as usual with your  normal  Shampoo.   3.  After you shampoo, rinse your hair and body thoroughly to remove the  shampoo.                                         4.  Use CHG as you would any other liquid soap.  You can apply chg directly  to the skin and wash . Gently wash with scrungie or clean wascloth    5.  Apply the CHG Soap to your body ONLY FROM THE NECK DOWN.   Do not use on open                           Wound or open sores. Avoid contact with eyes, ears mouth and genitals (private parts).                        Genitals (private parts) with your normal soap.              6.  Wash thoroughly, paying special attention to the area where your surgery  will be performed.   7.  Thoroughly rinse your body with warm water from the neck down.   8.  DO NOT shower/wash with your normal soap after using and rinsing off  the CHG Soap .                9.  Pat yourself dry with a clean towel.             10.  Wear clean pajamas.             11.  Place clean sheets on your bed the night of your first shower and do not  sleep with pets.  Day of Surgery : Do not apply any lotions/deodorants the morning of surgery.  Please wear clean clothes to the hospital/surgery center.  FAILURE TO FOLLOW THESE INSTRUCTIONS MAY RESULT IN THE CANCELLATION OF YOUR SURGERY    PATIENT SIGNATURE_________________________________  ______________________________________________________________________     [name]  An incentive spirometer is a tool that can help keep your lungs clear and active. This tool measures how well you are filling your lungs with each breath. Taking long deep breaths may help reverse or decrease the chance of developing  breathing (pulmonary) problems (especially infection) following:  A long period of time when you are unable to move or be active. BEFORE THE PROCEDURE   If the spirometer includes an indicator to show your best effort, your nurse or respiratory therapist will set it to a desired goal.  If possible, sit up straight or lean slightly forward. Try not to slouch.  Hold the incentive spirometer in an upright position. INSTRUCTIONS FOR USE  1. Sit on the edge of your bed if possible, or sit up as far as you can in bed or on a chair. 2. Hold the incentive spirometer in an upright position. 3. Breathe out normally. 4. Place the mouthpiece in your mouth and seal your lips tightly around it. 5. Breathe in slowly and as deeply as possible, raising the piston or the ball toward the top of the  column. 6. Hold your breath for 3-5 seconds or for as long as possible. Allow the piston or ball to fall to the bottom of the column. 7. Remove the mouthpiece from your mouth and breathe out normally. 8. Rest for a few seconds and repeat Steps 1 through 7 at least 10 times every 1-2 hours when you are awake. Take your time and take a few normal breaths between deep breaths. 9. The spirometer may include an indicator to show your best effort. Use the indicator as a goal to work toward during each repetition. 10. After each set of 10 deep breaths, practice coughing to be sure your lungs are clear. If you have an incision (the cut made at the time of surgery), support your incision when coughing by placing a pillow or rolled up towels firmly against it. Once you are able to get out of bed, walk around indoors and cough well. You may stop using the incentive spirometer when instructed by your caregiver.  RISKS AND COMPLICATIONS  Take your time so you do not get dizzy or light-headed.  If you are in pain, you may need to take or ask for pain medication before doing incentive spirometry. It is harder to take a deep  breath if you are having pain. AFTER USE  Rest and breathe slowly and easily.  It can be helpful to keep track of a log of your progress. Your caregiver can provide you with a simple table to help with this. If you are using the spirometer at home, follow these instructions: [place] IF:   You are having difficultly using the spirometer.  You have trouble using the spirometer as often as instructed.  Your pain medication is not giving enough relief while using the spirometer.  You develop fever of 100.5 F (38.1 C) or higher. SEEK IMMEDIATE MEDICAL CARE IF:   You cough up bloody sputum that had not been present before.  You develop fever of 102 F (38.9 C) or greater.  You develop worsening pain at or near the incision site. MAKE SURE YOU:   Understand these instructions.  Will watch your condition.  Will get help right away if you are not doing well or get worse. Document Released: 07/06/2006 Document Revised: 05/18/2011 Document Reviewed: 09/06/2006 Exit[employer] Patient Information 2014 [employer], [place].   ________________________________________________________________________  WHAT IS A BLOOD TRANSFUSION? Blood Transfusion Information  A transfusion is the replacement of blood or some of its parts. Blood is made up of multiple cells which provide different functions.  Red blood cells carry oxygen and are used for blood loss replacement.  White blood cells fight against infection.  Platelets control bleeding.  Plasma helps clot blood.  Other blood products are available for specialized needs, such as hemophilia or other clotting disorders. BEFORE THE TRANSFUSION  Who gives blood for transfusions?   Healthy volunteers who are fully evaluated to make sure their blood is safe. This is blood bank blood. Transfusion therapy is the safest it has ever been in the practice of medicine. Before blood is taken from a donor, a complete history is taken to make sure  that person has no history of diseases nor engages in risky social behavior (examples are intravenous drug use or sexual activity with multiple partners). The donor's travel history is screened to minimize risk of transmitting infections, such as malaria. The donated blood is tested for signs of infectious diseases, such as HIV and hepatitis. The blood is then tested to be sure  it is compatible with you in order to minimize the chance of a transfusion reaction. If you or a relative donates blood, this is often done in anticipation of surgery and is not appropriate for emergency situations. It takes many days to process the donated blood. RISKS AND COMPLICATIONS Although transfusion therapy is very safe and saves many lives, the main dangers of transfusion include:   Getting an infectious disease.  Developing a transfusion reaction. This is an allergic reaction to something in the blood you were given. Every precaution is taken to prevent this. The decision to have a blood transfusion has been considered carefully by your caregiver before blood is given. Blood is not given unless the benefits outweigh the risks. AFTER THE TRANSFUSION  Right after receiving a blood transfusion, you will usually feel much better and more energetic. This is especially true if your red blood cells have gotten low (anemic). The transfusion raises the level of the red blood cells which carry oxygen, and this usually causes an energy increase.  The nurse administering the transfusion will monitor you carefully for complications. HOME CARE INSTRUCTIONS  No special instructions are needed after a transfusion. You may find your energy is better. Speak with your caregiver about any limitations on activity for underlying diseases you may have. SEEK MEDICAL CARE IF:   Your condition is not improving after your transfusion.  You develop redness or irritation at the intravenous (IV) site. SEEK IMMEDIATE MEDICAL CARE IF:  Any of  the following symptoms occur over the next 12 hours:  Shaking chills.  You have a temperature by mouth above 102 F (38.9 C), not controlled by medicine.  Chest, back, or muscle pain.  People around you feel you are not acting correctly or are confused.  Shortness of breath or difficulty breathing.  Dizziness and fainting.  You get a rash or develop hives.  You have a decrease in urine output.  Your urine turns a dark color or changes to pink, red, or brown. Any of the following symptoms occur over the next 10 days:  You have a temperature by mouth above 102 F (38.9 C), not controlled by medicine.  Shortness of breath.  Weakness after normal activity.  The white part of the eye turns yellow (jaundice).  You have a decrease in the amount of urine or are urinating less often.  Your urine turns a dark color or changes to pink, red, or brown. Document Released: 02/21/2000 Document Revised: 05/18/2011 Document Reviewed: 10/10/2007 [facility] Patient Information 2014 [place], [place].  _______________________________________________________________________

## 2013-10-06 NOTE — Progress Notes (Signed)
Pre visit review using our clinic review tool, if applicable. No additional management support is needed unless otherwise documented below in the visit note. 

## 2013-10-06 NOTE — Progress Notes (Signed)
   Subjective:    Patient ID: [name], female    DOB: Dec 16, 1931, 80 y.o.   MRN: [id number]  HPI  She's had increasing bilateral pedal edema for least a week. She has had this in the past but it has been intermittent. This was associated with slight gain weight.  She realized that she has been cooking with beef bullion which has a high sodium content.  She stopped doing that ; elevated the legs ; & she took 2 of her diuretics the past 2 days rather than one with improvement in the edema.  Edema accumulation was associated with some increase in exertional dyspnea but she is basically sedentary due to her COPD.   Her Orthopedist is asking for an update concerning the edema; specific concern was possible incipient heart failure.  She is also inquiring about her thyroid function tests. These were done 09/05/13. They were interpreted within 4 hours of being collected and sent through the My Chart portal. She was also mailed a copy. We discussed closing that portal if it is not going to be employed to prevent confusion and miscommunication.   Chart review reveals that she's had normal renal function in March of this year. Her albumin was low at 3.2. Hepatic function was normal at that time.   Review of Systems  She specifically denies paroxysmal nocturnal dyspnea. She does sleep with oxygen at night and also CPAP. She is not having chest pain or palpitations. No symptoms of COPD exacerbation present.     Objective:   Physical Exam Significant or positive findings:   As per CDC Guidelines ,Epic documents mild obesity as being present . She has complete dentures Mild rhonchi are noted at the bases. Upper lobe breath sounds are decreased and distant She has an S4; heart sounds are also distant. She has one half-1+ pitting edema at the ankles. Pedal pulses are decreased. She is markedly weak & is unable to stand without being pulled out of the chair. She ambulates with a  cane.  General appearance :adequately nourished; in no distress. Eyes: No conjunctival inflammation or scleral icterus is present. Neck: no NVD; no masses Oral exam:  Lips and gums are healthy appearing.There is no oropharyngeal erythema or exudate noted.  Heart:  Normal rate and regular rhythm. S1 and S2 normal without gallop, murmur, click, or rub  Lungs:.No increased work of breathing @ rest..  Abdomen: bowel sounds normal, soft and non-tender without masses, organomegaly or hernias noted.  No guarding or rebound. Skin:Warm & dry.  Intact without suspicious lesions or rashes ; no jaundice or tenting Lymphatic: No lymphadenopathy is noted about the head, neck, axilla          Assessment & Plan:  #1 exacerbation of edema in the context of #2 , #3, & #4 #2 increased salt intake #3 venous insufficiency #4 low albumin.  Plan: Various options were recommended. Sodium restriction was encouraged along with protein supplementation. Support hose were prescribed. There is no evidence of heart failure which would preclude the upcoming orthopedic surgery. She has been cleared by her cardiologist and pulmonologist which is paramount in context of  Gold Stage C COPD; OSA & hx chronic diastolic heart failure

## 2013-10-06 NOTE — Patient Instructions (Signed)
Avoid ingestion of  excess salt/sodium.Cook with pepper & other spices . Use the salt substitute "No Salt" OR the Mrs Deliah Boston products to season food @ the table. Avoid foods which taste salty or "vinegary" as their sodium content will be high.  Wear over the calf support hose if you are  on your feet for prolonged periods of time. Albumin reflects nutrition; low fat  or plant protein (Ex soy) sources from [facility]  are recommended as supplements.Dose should not exceed per package label. Low albumin can also be  associated with significant swelling in the legs

## 2013-10-06 NOTE — Telephone Encounter (Signed)
Faxed pre op clearance to [place] for right tka-medial & lateral w/wo patella resurfacing.

## 2013-10-09 ENCOUNTER — Other Ambulatory Visit: Payer: Self-pay

## 2013-10-09 ENCOUNTER — Telehealth: Payer: Self-pay

## 2013-10-09 ENCOUNTER — Encounter (HOSPITAL_COMMUNITY): Payer: Self-pay

## 2013-10-09 ENCOUNTER — Encounter (HOSPITAL_COMMUNITY)
Admission: RE | Admit: 2013-10-09 | Discharge: 2013-10-09 | Disposition: A | Discharge status: Home / Self Care | Payer: Medicare Other | Source: Ambulatory Visit | Attending: Orthopedic Surgery | Admitting: Orthopedic Surgery

## 2013-10-09 ENCOUNTER — Ambulatory Visit (HOSPITAL_COMMUNITY)
Admission: RE | Admit: 2013-10-09 | Discharge: 2013-10-09 | Disposition: A | Discharge status: Home / Self Care | Payer: Medicare Other | Source: Ambulatory Visit | Attending: Anesthesiology | Admitting: Anesthesiology

## 2013-10-09 DIAGNOSIS — Z01818 Encounter for other preprocedural examination: Secondary | ICD-10-CM | POA: Insufficient documentation

## 2013-10-09 HISTORY — DX: Adverse effect of unspecified anesthetic, initial encounter: T41.45XA

## 2013-10-09 HISTORY — DX: Personal history of other malignant neoplasm of skin: Z85.828

## 2013-10-09 HISTORY — DX: Dermatitis, unspecified: L30.9

## 2013-10-09 HISTORY — DX: Other complications of anesthesia, initial encounter: T88.59XA

## 2013-10-09 LAB — CBC
HCT: 41.5 % (ref 36.0–46.0)
Hemoglobin: 13.1 g/dL (ref 12.0–15.0)
MCH: 28.6 pg (ref 26.0–34.0)
MCHC: 31.6 g/dL (ref 30.0–36.0)
MCV: 90.6 fL (ref 78.0–100.0)
Platelets: 234 10*3/uL (ref 150–400)
RBC: 4.58 MIL/uL (ref 3.87–5.11)
RDW: 15.6 % — ABNORMAL HIGH (ref 11.5–15.5)
WBC: 9.4 10*3/uL (ref 4.0–10.5)

## 2013-10-09 LAB — COMPREHENSIVE METABOLIC PANEL
ALBUMIN: 3.4 g/dL — AB (ref 3.5–5.2)
ALT: 12 U/L (ref 0–35)
AST: 23 U/L (ref 0–37)
Alkaline Phosphatase: 81 U/L (ref 39–117)
Anion gap: 11 (ref 5–15)
BUN: 16 mg/dL (ref 6–23)
CO2: 24 mEq/L (ref 19–32)
Calcium: 9.8 mg/dL (ref 8.4–10.5)
Chloride: 106 mEq/L (ref 96–112)
Creatinine, Ser: 0.97 mg/dL (ref 0.50–1.10)
GFR calc Af Amer: 62 mL/min — ABNORMAL LOW (ref >90)
GFR calc non Af Amer: 54 mL/min — ABNORMAL LOW (ref >90)
Glucose, Bld: 132 mg/dL — ABNORMAL HIGH (ref 70–99)
Potassium: 4.9 mEq/L (ref 3.7–5.3)
Sodium: 141 mEq/L (ref 137–147)
Total Bilirubin: 0.3 mg/dL (ref 0.3–1.2)
Total Protein: 7.5 g/dL (ref 6.0–8.3)

## 2013-10-09 LAB — URINALYSIS, ROUTINE W REFLEX MICROSCOPIC
BILIRUBIN URINE: NEGATIVE
Glucose, UA: NEGATIVE mg/dL
Ketones, ur: NEGATIVE mg/dL
Nitrite: NEGATIVE
Protein, ur: NEGATIVE mg/dL
Specific Gravity, Urine: 1.019 (ref 1.005–1.030)
UROBILINOGEN UA: 0.2 mg/dL (ref 0.0–1.0)
pH: 7 (ref 5.0–8.0)

## 2013-10-09 LAB — SURGICAL PCR SCREEN
MRSA, PCR: NEGATIVE
Staphylococcus aureus: NEGATIVE

## 2013-10-09 LAB — URINE MICROSCOPIC-ADD ON

## 2013-10-09 LAB — PROTIME-INR
INR: 1.07 (ref 0.00–1.49)
PROTHROMBIN TIME: 13.9 s (ref 11.6–15.2)

## 2013-10-09 LAB — ABO/RH: ABO/RH(D): A POS

## 2013-10-09 LAB — APTT: aPTT: 37 seconds (ref 24–37)

## 2013-10-09 MED ORDER — METFORMIN HCL 500 MG PO TABS: 500.0000 mg | ORAL_TABLET | Freq: Two times a day (BID) | ORAL | Status: DC

## 2013-10-09 NOTE — Telephone Encounter (Signed)
10/06/13 office note has been faxed to attention Gorden Harms at Dr Alusio's office at [phone]

## 2013-10-09 NOTE — Telephone Encounter (Signed)
Message copied by Shelly Coss on Mon Oct 09, 2013  8:59 AM ------      Message from: Hendricks Limes      Created: Sat Oct 07, 2013  6:45 AM       Please FAX office visit to Dr Alusio's office(Attention: Gorden Harms) ------

## 2013-10-11 DIAGNOSIS — M1710 Unilateral primary osteoarthritis, unspecified knee: Secondary | ICD-10-CM | POA: Diagnosis not present

## 2013-10-11 NOTE — Progress Notes (Signed)
Abnormal UA faxed to Dr. Wynelle Link 10/09/13

## 2013-10-12 NOTE — Progress Notes (Signed)
Received faxed note from [name] - no action needed for abnormal UA

## 2013-10-15 ENCOUNTER — Ambulatory Visit: Payer: Self-pay | Admitting: Orthopedic Surgery

## 2013-10-15 NOTE — H&P (Signed)
[name] DOB: 12-25-1931 Married / Language: English / Race: White Female Date of Admission:  10-16-2013 Chief Comp;aint:  Right Knee Pain History of Present Illness The patient is a 80 year old female who comes in  for a preoperative history and physical. The patient is scheduled for a right total knee arthroplasty to be performed by Dr. Dione Plover. Aluisio, MD at [facility] on 10/16/2013. The patient is a 80 year old female who presents for follow up of their knee. The patient is being followed for their right knee pain and osteoarthritis.  Symptoms reported today include: pain, aching and difficulty ambulating. The patient feels that they are doing poorly and report their pain level to be moderate to severe. The following medication has been used for pain control: none.  She saw Molli Barrows PA-C weeks ago, requesting a cortisone injection to hold her over until surgery. She was not injected at that time due to her diabetes, as she was not sure what her A1C was at that time. The knee is bothering her at all times.  She is now ready to proceed with surgery for the right knee. They have been treated conservatively in the past for the above stated problem and despite conservative measures, they continue to have progressive pain and severe functional limitations and dysfunction. They have failed non-operative management including home exercise, medications, and injections. It is felt that they would benefit from undergoing total joint replacement. Risks and benefits of the procedure have been discussed with the patient and they elect to proceed with surgery. There are no active contraindications to surgery such as ongoing infection or rapidly progressive neurological disease.  Allergies  Sulfa Drugs Itching.  Problem List/Past Medical Primary osteoarthritis of right knee (715.16  M17.11) Coronary Artery Disease/Heart Disease Peripheral Vascular Disease Congestive Heart  Failure Stroke COPD Sleep Apnea uses CPAP Arrhythmia Pacemaker Varicose veins Diabetes Mellitus, Type II Breast cancer Menopause Measles Childhood Mumps Childhood  Family History Chronic Obstructive Lung Disease Mother.  Social History Marital status married Current work status Retired. Living situation Lives with spouse. Post-Surgical Plans [facility] Advance Directives Living Will, Healthcare POA  Medication History ( Advair Diskus (250-50MCG/DOSE Aero Pow Br Act, Inhalation) Active. Atorvastatin Calcium (20MG  Tablet, Oral) Active. Cilostazol (100MG  Tablet, Oral) Active. Eliquis (5MG  Tablet, Oral) Active. Letrozole (2.5MG  Tablet, Oral) Active. MetFORMIN HCl (500MG  Tablet, Oral) Active. Nasonex (50MCG/ACT Suspension, Nasal) Active. Nystatin (100000 UNIT/ML Suspension, Mouth/Throat) Active. Potassium Chloride Crys ER (20MEQ Tablet ER, Oral) Active. Spiriva HandiHaler (18MCG Capsule, Inhalation) Active. Torsemide (10MG  Tablet, Oral) Active. Atenolol (25MG  Tablet, Oral) Active. Pletal (100MG  Tablet, Oral) Active. Xopenex HFA (45MCG/ACT Aerosol, Inhalation) Active. Mag-Ox (400MG  Tablet, Oral) Active. Vitamin B-12 (1000MCG Tablet, Oral) Active.  Past Surgical History Cataract Surgery left Breast Mass; Local Excision left Leg Circulation Surgery left Vascular Stenting Bilateral Lower Extemities Left Hip Surgery due to Fracture/Accident  Review of Systems  General Not Present- Chills, Fatigue, Fever, Memory Loss, Night Sweats, Weight Gain and Weight Loss. Skin Not Present- Eczema, Hives, Itching, Lesions and Rash. HEENT Not Present- Dentures, Double Vision, Headache, Hearing Loss, Tinnitus and Visual Loss. Respiratory Not Present- Allergies, Chronic Cough, Coughing up blood, Shortness of breath at rest and Shortness of breath with exertion. Cardiovascular Present- Edema and Swelling. Not Present- Chest Pain, Difficulty Breathing  Lying Down, Murmur, Palpitations and Racing/skipping heartbeats. Gastrointestinal Present- Diarrhea, Nausea and Vomiting. Not Present- Abdominal Pain, Bloody Stool, Constipation, Difficulty Swallowing, Heartburn, Jaundice and Loss of appetitie. Female Genitourinary Not Present- Blood  in Urine, Discharge, Flank Pain, Incontinence, Painful Urination, Urgency, Urinary frequency, Urinary Retention, Urinating at Night and Weak urinary stream. Musculoskeletal Present- Muscle Pain. Not Present- Back Pain, Joint Pain, Joint Swelling, Morning Stiffness, Muscle Weakness and Spasms. Neurological Not Present- Blackout spells, Difficulty with balance, Dizziness, Paralysis, Tremor and Weakness. Psychiatric Not Present- Insomnia.   Vitals Weight: 188 lb Height: 65.5in Weight was reported by patient. Height was reported by patient. Body Surface Area: 1.99 m Body Mass Index: 30.81 kg/m Pulse: 74 (Regular)  BP: 112/52 (Sitting, Right Arm, Standard)  Physical Exam General Mental Status -Alert, cooperative and good historian. General Appearance-pleasant, Not in acute distress. Orientation-Oriented X3. Build & Nutrition-Well nourished and Well developed.  Head and Neck Head-normocephalic, atraumatic . Neck Global Assessment - supple, no bruit auscultated on the right, no bruit auscultated on the left.  Eye Pupil - Bilateral-Regular and Round. Motion - Bilateral-EOMI.  Chest and Lung Exam Auscultation Breath sounds - clear at anterior chest wall and clear at posterior chest wall. Adventitious sounds - No Adventitious sounds.  Cardiovascular Auscultation Rhythm - Regular rate and rhythm. Heart Sounds - S1 WNL and S2 WNL. Murmurs & Other Heart Sounds - Auscultation of the heart reveals - No Murmurs.  Abdomen Palpation/Percussion Tenderness - Abdomen is non-tender to palpation. Rigidity (guarding) - Abdomen is soft. Auscultation Auscultation of the abdomen reveals - Bowel  sounds normal.  Female Genitourinary Note: Not done, not pertinent to present illness   Musculoskeletal Note: She is in no distress. Her right knee shows no effusion. There is marked crepitus on range of motion of the right knee with tenderness medial greater than lateral. No instability noted. Edema noted in both lower extremities.   Assessment & Plan  Primary osteoarthritis of right knee (715.16  M17.11) Note:Plan is for a Right Total Knee Replacement by Dr. Wynelle Link.  Plan is to go to [facility] [place]  The patient will receive topical TXA (tranexamic acid) due to: [name], [name], Stroke  Please note that the patient has had some confusion and delirium with anesthesia in the past.  Signed electronically by Ok Edwards, III PA-C

## 2013-10-16 ENCOUNTER — Inpatient Hospital Stay (HOSPITAL_COMMUNITY)
Admission: RE | Admit: 2013-10-16 | Discharge: 2013-10-19 | DRG: 470 | Disposition: A | Discharge status: Skilled Nursing Facility | Payer: Medicare Other | Source: Ambulatory Visit | Attending: Orthopedic Surgery | Admitting: Orthopedic Surgery

## 2013-10-16 ENCOUNTER — Encounter (HOSPITAL_COMMUNITY): Payer: Medicare Other | Admitting: Certified Registered Nurse Anesthetist

## 2013-10-16 ENCOUNTER — Inpatient Hospital Stay (HOSPITAL_COMMUNITY): Payer: Medicare Other | Admitting: Certified Registered Nurse Anesthetist

## 2013-10-16 ENCOUNTER — Encounter (HOSPITAL_COMMUNITY): Payer: Self-pay | Admitting: *Deleted

## 2013-10-16 ENCOUNTER — Encounter (HOSPITAL_COMMUNITY)
Admission: RE | Disposition: A | Discharge status: Skilled Nursing Facility | Payer: Self-pay | Source: Ambulatory Visit | Attending: Orthopedic Surgery

## 2013-10-16 DIAGNOSIS — Z6831 Body mass index (BMI) 31.0-31.9, adult: Secondary | ICD-10-CM

## 2013-10-16 DIAGNOSIS — E785 Hyperlipidemia, unspecified: Secondary | ICD-10-CM | POA: Diagnosis present

## 2013-10-16 DIAGNOSIS — I739 Peripheral vascular disease, unspecified: Secondary | ICD-10-CM | POA: Diagnosis present

## 2013-10-16 DIAGNOSIS — M6281 Muscle weakness (generalized): Secondary | ICD-10-CM | POA: Diagnosis not present

## 2013-10-16 DIAGNOSIS — Z96651 Presence of right artificial knee joint: Secondary | ICD-10-CM

## 2013-10-16 DIAGNOSIS — G4730 Sleep apnea, unspecified: Secondary | ICD-10-CM | POA: Diagnosis present

## 2013-10-16 DIAGNOSIS — M1710 Unilateral primary osteoarthritis, unspecified knee: Secondary | ICD-10-CM | POA: Diagnosis not present

## 2013-10-16 DIAGNOSIS — Z96659 Presence of unspecified artificial knee joint: Secondary | ICD-10-CM | POA: Diagnosis not present

## 2013-10-16 DIAGNOSIS — I5033 Acute on chronic diastolic (congestive) heart failure: Secondary | ICD-10-CM | POA: Diagnosis not present

## 2013-10-16 DIAGNOSIS — J449 Chronic obstructive pulmonary disease, unspecified: Secondary | ICD-10-CM | POA: Diagnosis not present

## 2013-10-16 DIAGNOSIS — I5032 Chronic diastolic (congestive) heart failure: Secondary | ICD-10-CM | POA: Diagnosis present

## 2013-10-16 DIAGNOSIS — N183 Chronic kidney disease, stage 3 unspecified: Secondary | ICD-10-CM | POA: Diagnosis present

## 2013-10-16 DIAGNOSIS — E119 Type 2 diabetes mellitus without complications: Secondary | ICD-10-CM | POA: Diagnosis present

## 2013-10-16 DIAGNOSIS — J438 Other emphysema: Secondary | ICD-10-CM | POA: Diagnosis present

## 2013-10-16 DIAGNOSIS — Z7901 Long term (current) use of anticoagulants: Secondary | ICD-10-CM | POA: Diagnosis not present

## 2013-10-16 DIAGNOSIS — F3289 Other specified depressive episodes: Secondary | ICD-10-CM | POA: Diagnosis present

## 2013-10-16 DIAGNOSIS — I2510 Atherosclerotic heart disease of native coronary artery without angina pectoris: Secondary | ICD-10-CM | POA: Diagnosis present

## 2013-10-16 DIAGNOSIS — I509 Heart failure, unspecified: Secondary | ICD-10-CM | POA: Diagnosis present

## 2013-10-16 DIAGNOSIS — Z8673 Personal history of transient ischemic attack (TIA), and cerebral infarction without residual deficits: Secondary | ICD-10-CM

## 2013-10-16 DIAGNOSIS — Z471 Aftercare following joint replacement surgery: Secondary | ICD-10-CM | POA: Diagnosis not present

## 2013-10-16 DIAGNOSIS — M1711 Unilateral primary osteoarthritis, right knee: Secondary | ICD-10-CM

## 2013-10-16 DIAGNOSIS — I4891 Unspecified atrial fibrillation: Secondary | ICD-10-CM | POA: Diagnosis present

## 2013-10-16 DIAGNOSIS — M25569 Pain in unspecified knee: Secondary | ICD-10-CM | POA: Diagnosis not present

## 2013-10-16 DIAGNOSIS — J9819 Other pulmonary collapse: Secondary | ICD-10-CM | POA: Diagnosis not present

## 2013-10-16 DIAGNOSIS — K219 Gastro-esophageal reflux disease without esophagitis: Secondary | ICD-10-CM | POA: Diagnosis present

## 2013-10-16 DIAGNOSIS — J90 Pleural effusion, not elsewhere classified: Secondary | ICD-10-CM | POA: Diagnosis not present

## 2013-10-16 DIAGNOSIS — R279 Unspecified lack of coordination: Secondary | ICD-10-CM | POA: Diagnosis not present

## 2013-10-16 DIAGNOSIS — Z79899 Other long term (current) drug therapy: Secondary | ICD-10-CM

## 2013-10-16 DIAGNOSIS — S99919A Unspecified injury of unspecified ankle, initial encounter: Secondary | ICD-10-CM | POA: Diagnosis not present

## 2013-10-16 DIAGNOSIS — R269 Unspecified abnormalities of gait and mobility: Secondary | ICD-10-CM | POA: Diagnosis not present

## 2013-10-16 DIAGNOSIS — Z85828 Personal history of other malignant neoplasm of skin: Secondary | ICD-10-CM

## 2013-10-16 DIAGNOSIS — F329 Major depressive disorder, single episode, unspecified: Secondary | ICD-10-CM | POA: Diagnosis present

## 2013-10-16 DIAGNOSIS — Z950 Presence of cardiac pacemaker: Secondary | ICD-10-CM | POA: Diagnosis not present

## 2013-10-16 DIAGNOSIS — M179 Osteoarthritis of knee, unspecified: Secondary | ICD-10-CM | POA: Diagnosis present

## 2013-10-16 DIAGNOSIS — S8990XA Unspecified injury of unspecified lower leg, initial encounter: Secondary | ICD-10-CM | POA: Diagnosis not present

## 2013-10-16 HISTORY — PX: TOTAL KNEE ARTHROPLASTY: SHX125

## 2013-10-16 LAB — GLUCOSE, CAPILLARY
Glucose-Capillary: 149 mg/dL — ABNORMAL HIGH (ref 70–99)
Glucose-Capillary: 106 mg/dL — ABNORMAL HIGH (ref 70–99)
Glucose-Capillary: 165 mg/dL — ABNORMAL HIGH (ref 70–99)

## 2013-10-16 LAB — TYPE AND SCREEN
ABO/RH(D): A POS
ANTIBODY SCREEN: NEGATIVE

## 2013-10-16 SURGERY — ARTHROPLASTY, KNEE, TOTAL
Anesthesia: Spinal | Site: Knee | Laterality: Right

## 2013-10-16 MED ORDER — INSULIN ASPART 100 UNIT/ML ~~LOC~~ SOLN
0.0000 [IU] | Freq: Three times a day (TID) | SUBCUTANEOUS | Status: DC
  Administered 2013-10-16: 3 [IU] via SUBCUTANEOUS
  Administered 2013-10-17: 5 [IU] via SUBCUTANEOUS
  Administered 2013-10-18 (×2): 3 [IU] via SUBCUTANEOUS
  Administered 2013-10-18: 5 [IU] via SUBCUTANEOUS
  Administered 2013-10-19: 2 [IU] via SUBCUTANEOUS

## 2013-10-16 MED ORDER — ACETAMINOPHEN 500 MG PO TABS
1000.0000 mg | ORAL_TABLET | Freq: Four times a day (QID) | ORAL | Status: AC
  Administered 2013-10-16 – 2013-10-17 (×4): 1000 mg via ORAL
  Filled 2013-10-16 (×4): qty 2

## 2013-10-16 MED ORDER — DOCUSATE SODIUM 100 MG PO CAPS
100.0000 mg | ORAL_CAPSULE | Freq: Two times a day (BID) | ORAL | Status: DC
  Administered 2013-10-16 – 2013-10-19 (×6): 100 mg via ORAL

## 2013-10-16 MED ORDER — TRAMADOL HCL 50 MG PO TABS: 50.0000 mg | ORAL_TABLET | Freq: Four times a day (QID) | ORAL | Status: DC | PRN

## 2013-10-16 MED ORDER — PHENOL 1.4 % MT LIQD
1.0000 | OROMUCOSAL | Status: DC | PRN
  Filled 2013-10-16: qty 177

## 2013-10-16 MED ORDER — TORSEMIDE 10 MG PO TABS
10.0000 mg | ORAL_TABLET | Freq: Every morning | ORAL | Status: DC
  Administered 2013-10-16 – 2013-10-19 (×4): 10 mg via ORAL
  Filled 2013-10-16 (×4): qty 1

## 2013-10-16 MED ORDER — ONDANSETRON HCL 4 MG/2ML IJ SOLN
INTRAMUSCULAR | Status: DC | PRN
  Administered 2013-10-16 (×2): 2 mg via INTRAVENOUS

## 2013-10-16 MED ORDER — POTASSIUM CHLORIDE CRYS ER 20 MEQ PO TBCR
60.0000 meq | EXTENDED_RELEASE_TABLET | Freq: Every morning | ORAL | Status: DC
  Administered 2013-10-17 – 2013-10-19 (×3): 60 meq via ORAL
  Filled 2013-10-16 (×3): qty 3

## 2013-10-16 MED ORDER — PHENYLEPHRINE HCL 10 MG/ML IJ SOLN: INTRAMUSCULAR | Status: DC | PRN

## 2013-10-16 MED ORDER — DEXAMETHASONE SODIUM PHOSPHATE 10 MG/ML IJ SOLN: 10.0000 mg | Freq: Once | INTRAMUSCULAR | Status: DC

## 2013-10-16 MED ORDER — ATENOLOL 25 MG PO TABS
25.0000 mg | ORAL_TABLET | Freq: Every morning | ORAL | Status: DC
  Administered 2013-10-17 – 2013-10-19 (×3): 25 mg via ORAL
  Filled 2013-10-16 (×3): qty 1

## 2013-10-16 MED ORDER — KETOROLAC TROMETHAMINE 15 MG/ML IJ SOLN
7.5000 mg | Freq: Four times a day (QID) | INTRAMUSCULAR | Status: AC | PRN
  Administered 2013-10-16: 7.5 mg via INTRAVENOUS
  Filled 2013-10-16: qty 1

## 2013-10-16 MED ORDER — BUPIVACAINE IN DEXTROSE 0.75-8.25 % IT SOLN
INTRATHECAL | Status: DC | PRN
  Administered 2013-10-16: 2 mL via INTRATHECAL

## 2013-10-16 MED ORDER — FLEET ENEMA 7-19 GM/118ML RE ENEM: 1.0000 | ENEMA | Freq: Once | RECTAL | Status: AC | PRN

## 2013-10-16 MED ORDER — ONDANSETRON HCL 4 MG PO TABS: 4.0000 mg | ORAL_TABLET | Freq: Four times a day (QID) | ORAL | Status: DC | PRN

## 2013-10-16 MED ORDER — TRANEXAMIC ACID 100 MG/ML IV SOLN
2000.0000 mg | Freq: Once | INTRAVENOUS | Status: DC
  Filled 2013-10-16: qty 20

## 2013-10-16 MED ORDER — MOMETASONE FURO-FORMOTEROL FUM 100-5 MCG/ACT IN AERO
2.0000 | INHALATION_SPRAY | Freq: Two times a day (BID) | RESPIRATORY_TRACT | Status: DC
  Administered 2013-10-16 – 2013-10-19 (×6): 2 via RESPIRATORY_TRACT
  Filled 2013-10-16: qty 8.8

## 2013-10-16 MED ORDER — MENTHOL 3 MG MT LOZG
1.0000 | LOZENGE | OROMUCOSAL | Status: DC | PRN
  Filled 2013-10-16: qty 9

## 2013-10-16 MED ORDER — DIPHENHYDRAMINE HCL 12.5 MG/5ML PO ELIX: 12.5000 mg | ORAL_SOLUTION | ORAL | Status: DC | PRN

## 2013-10-16 MED ORDER — POLYETHYLENE GLYCOL 3350 17 G PO PACK
17.0000 g | PACK | Freq: Every day | ORAL | Status: DC | PRN
  Administered 2013-10-17: 17 g via ORAL

## 2013-10-16 MED ORDER — SODIUM CHLORIDE 0.9 % IV SOLN
10.0000 mg | INTRAVENOUS | Status: DC | PRN
  Administered 2013-10-16: 20 ug/min via INTRAVENOUS

## 2013-10-16 MED ORDER — METFORMIN HCL 500 MG PO TABS
500.0000 mg | ORAL_TABLET | Freq: Two times a day (BID) | ORAL | Status: DC
  Administered 2013-10-17: 500 mg via ORAL
  Filled 2013-10-16 (×3): qty 1

## 2013-10-16 MED ORDER — FENTANYL CITRATE 0.05 MG/ML IJ SOLN
INTRAMUSCULAR | Status: AC
  Filled 2013-10-16: qty 2

## 2013-10-16 MED ORDER — BUPIVACAINE HCL 0.25 % IJ SOLN
INTRAMUSCULAR | Status: DC | PRN
  Administered 2013-10-16: 20 mL

## 2013-10-16 MED ORDER — METOCLOPRAMIDE HCL 10 MG PO TABS: 5.0000 mg | ORAL_TABLET | Freq: Three times a day (TID) | ORAL | Status: DC | PRN

## 2013-10-16 MED ORDER — ONDANSETRON HCL 4 MG/2ML IJ SOLN: 4.0000 mg | Freq: Four times a day (QID) | INTRAMUSCULAR | Status: DC | PRN

## 2013-10-16 MED ORDER — PROMETHAZINE HCL 25 MG/ML IJ SOLN: 6.2500 mg | INTRAMUSCULAR | Status: DC | PRN

## 2013-10-16 MED ORDER — PROPOFOL 10 MG/ML IV BOLUS
INTRAVENOUS | Status: AC
  Filled 2013-10-16: qty 20

## 2013-10-16 MED ORDER — PROPOFOL 10 MG/ML IV BOLUS
INTRAVENOUS | Status: AC
Start: 2013-10-16 — End: 2013-10-16
  Filled 2013-10-16: qty 20

## 2013-10-16 MED ORDER — BUPIVACAINE HCL (PF) 0.25 % IJ SOLN
INTRAMUSCULAR | Status: AC
  Filled 2013-10-16: qty 30

## 2013-10-16 MED ORDER — SODIUM CHLORIDE 0.9 % IV SOLN
INTRAVENOUS | Status: DC
  Administered 2013-10-16 – 2013-10-17 (×2): via INTRAVENOUS

## 2013-10-16 MED ORDER — TIOTROPIUM BROMIDE MONOHYDRATE 18 MCG IN CAPS
18.0000 ug | ORAL_CAPSULE | Freq: Every day | RESPIRATORY_TRACT | Status: DC
  Administered 2013-10-17 – 2013-10-19 (×3): 18 ug via RESPIRATORY_TRACT
  Filled 2013-10-16: qty 5

## 2013-10-16 MED ORDER — CHLORHEXIDINE GLUCONATE 4 % EX LIQD: 60.0000 mL | Freq: Once | CUTANEOUS | Status: DC

## 2013-10-16 MED ORDER — SODIUM CHLORIDE 0.9 % IJ SOLN
INTRAMUSCULAR | Status: AC
  Filled 2013-10-16: qty 50

## 2013-10-16 MED ORDER — DEXAMETHASONE SODIUM PHOSPHATE 10 MG/ML IJ SOLN
10.0000 mg | Freq: Every day | INTRAMUSCULAR | Status: AC
  Filled 2013-10-16: qty 1

## 2013-10-16 MED ORDER — ACETAMINOPHEN 325 MG PO TABS
650.0000 mg | ORAL_TABLET | Freq: Four times a day (QID) | ORAL | Status: DC | PRN
  Administered 2013-10-17 – 2013-10-18 (×2): 650 mg via ORAL
  Filled 2013-10-16 (×2): qty 2

## 2013-10-16 MED ORDER — MEPERIDINE HCL 50 MG/ML IJ SOLN: 6.2500 mg | INTRAMUSCULAR | Status: DC | PRN

## 2013-10-16 MED ORDER — ATORVASTATIN CALCIUM 20 MG PO TABS
20.0000 mg | ORAL_TABLET | Freq: Every evening | ORAL | Status: DC
  Administered 2013-10-16 – 2013-10-18 (×3): 20 mg via ORAL
  Filled 2013-10-16 (×4): qty 1

## 2013-10-16 MED ORDER — TRANEXAMIC ACID 100 MG/ML IV SOLN
2000.0000 mg | INTRAVENOUS | Status: DC | PRN
  Administered 2013-10-16: 2000 mg via TOPICAL

## 2013-10-16 MED ORDER — SODIUM CHLORIDE 0.9 % IV SOLN: INTRAVENOUS | Status: DC

## 2013-10-16 MED ORDER — METHOCARBAMOL 1000 MG/10ML IJ SOLN
500.0000 mg | Freq: Four times a day (QID) | INTRAVENOUS | Status: DC | PRN
  Administered 2013-10-16: 500 mg via INTRAVENOUS
  Filled 2013-10-16: qty 5

## 2013-10-16 MED ORDER — PROPOFOL INFUSION 10 MG/ML OPTIME
INTRAVENOUS | Status: DC | PRN
  Administered 2013-10-16: 100 ug/kg/min via INTRAVENOUS

## 2013-10-16 MED ORDER — CEFAZOLIN SODIUM-DEXTROSE 2-3 GM-% IV SOLR
2.0000 g | INTRAVENOUS | Status: AC
  Administered 2013-10-16: 2 g via INTRAVENOUS

## 2013-10-16 MED ORDER — LEVALBUTEROL TARTRATE 45 MCG/ACT IN AERO: 2.0000 | INHALATION_SPRAY | Freq: Four times a day (QID) | RESPIRATORY_TRACT | Status: DC | PRN

## 2013-10-16 MED ORDER — CEFAZOLIN SODIUM-DEXTROSE 2-3 GM-% IV SOLR
INTRAVENOUS | Status: AC
  Filled 2013-10-16: qty 50

## 2013-10-16 MED ORDER — FLUTICASONE PROPIONATE 50 MCG/ACT NA SUSP
2.0000 | Freq: Every day | NASAL | Status: DC | PRN
  Filled 2013-10-16: qty 16

## 2013-10-16 MED ORDER — PHENYLEPHRINE HCL 10 MG/ML IJ SOLN
INTRAMUSCULAR | Status: AC
  Filled 2013-10-16: qty 1

## 2013-10-16 MED ORDER — ONDANSETRON HCL 4 MG/2ML IJ SOLN
INTRAMUSCULAR | Status: AC
  Filled 2013-10-16: qty 2

## 2013-10-16 MED ORDER — SODIUM CHLORIDE 0.9 % IJ SOLN
INTRAMUSCULAR | Status: DC | PRN
  Administered 2013-10-16: 30 mL

## 2013-10-16 MED ORDER — BUPIVACAINE LIPOSOME 1.3 % IJ SUSP
INTRAMUSCULAR | Status: DC | PRN
  Administered 2013-10-16: 20 mL

## 2013-10-16 MED ORDER — EPHEDRINE SULFATE 50 MG/ML IJ SOLN
INTRAMUSCULAR | Status: AC
  Filled 2013-10-16: qty 1

## 2013-10-16 MED ORDER — ACETAMINOPHEN 10 MG/ML IV SOLN
INTRAVENOUS | Status: DC | PRN
  Administered 2013-10-16: 1000 mg via INTRAVENOUS

## 2013-10-16 MED ORDER — METOCLOPRAMIDE HCL 5 MG/ML IJ SOLN: 5.0000 mg | Freq: Three times a day (TID) | INTRAMUSCULAR | Status: DC | PRN

## 2013-10-16 MED ORDER — METHOCARBAMOL 500 MG PO TABS
500.0000 mg | ORAL_TABLET | Freq: Four times a day (QID) | ORAL | Status: DC | PRN
  Administered 2013-10-17 – 2013-10-19 (×5): 500 mg via ORAL
  Filled 2013-10-16 (×6): qty 1

## 2013-10-16 MED ORDER — SODIUM CHLORIDE 0.9 % IJ SOLN
INTRAMUSCULAR | Status: AC
  Filled 2013-10-16: qty 10

## 2013-10-16 MED ORDER — ACETAMINOPHEN 10 MG/ML IV SOLN
1000.0000 mg | Freq: Once | INTRAVENOUS | Status: DC
  Filled 2013-10-16: qty 100

## 2013-10-16 MED ORDER — ATROPINE SULFATE 0.4 MG/ML IJ SOLN
INTRAMUSCULAR | Status: AC
  Filled 2013-10-16: qty 1

## 2013-10-16 MED ORDER — RIVAROXABAN 10 MG PO TABS: 10.0000 mg | ORAL_TABLET | Freq: Every day | ORAL | Status: DC

## 2013-10-16 MED ORDER — RIVAROXABAN 10 MG PO TABS
10.0000 mg | ORAL_TABLET | Freq: Every day | ORAL | Status: DC
  Administered 2013-10-17 – 2013-10-19 (×3): 10 mg via ORAL
  Filled 2013-10-16 (×4): qty 1

## 2013-10-16 MED ORDER — 0.9 % SODIUM CHLORIDE (POUR BTL) OPTIME
TOPICAL | Status: DC | PRN
  Administered 2013-10-16: 1000 mL

## 2013-10-16 MED ORDER — DEXAMETHASONE 6 MG PO TABS
10.0000 mg | ORAL_TABLET | Freq: Every day | ORAL | Status: AC
  Administered 2013-10-17: 10 mg via ORAL
  Filled 2013-10-16: qty 1

## 2013-10-16 MED ORDER — LACTATED RINGERS IV SOLN
INTRAVENOUS | Status: DC | PRN
  Administered 2013-10-16: 08:00:00 via INTRAVENOUS

## 2013-10-16 MED ORDER — FENTANYL CITRATE 0.05 MG/ML IJ SOLN: 25.0000 ug | INTRAMUSCULAR | Status: DC | PRN

## 2013-10-16 MED ORDER — HYDROMORPHONE HCL PF 1 MG/ML IJ SOLN
0.5000 mg | INTRAMUSCULAR | Status: DC | PRN
  Administered 2013-10-16 – 2013-10-17 (×3): 0.5 mg via INTRAVENOUS
  Filled 2013-10-16 (×3): qty 1

## 2013-10-16 MED ORDER — ACETAMINOPHEN 650 MG RE SUPP: 650.0000 mg | Freq: Four times a day (QID) | RECTAL | Status: DC | PRN

## 2013-10-16 MED ORDER — BISACODYL 10 MG RE SUPP: 10.0000 mg | Freq: Every day | RECTAL | Status: DC | PRN

## 2013-10-16 MED ORDER — LETROZOLE 2.5 MG PO TABS
2.5000 mg | ORAL_TABLET | Freq: Every day | ORAL | Status: DC
  Administered 2013-10-16 – 2013-10-19 (×4): 2.5 mg via ORAL
  Filled 2013-10-16 (×4): qty 1

## 2013-10-16 MED ORDER — BUPIVACAINE LIPOSOME 1.3 % IJ SUSP
20.0000 mL | Freq: Once | INTRAMUSCULAR | Status: DC
  Filled 2013-10-16: qty 20

## 2013-10-16 MED ORDER — MIDAZOLAM HCL 2 MG/2ML IJ SOLN
INTRAMUSCULAR | Status: AC
  Filled 2013-10-16: qty 2

## 2013-10-16 MED ORDER — CEFAZOLIN SODIUM-DEXTROSE 2-3 GM-% IV SOLR
2.0000 g | Freq: Four times a day (QID) | INTRAVENOUS | Status: AC
  Administered 2013-10-16 (×2): 2 g via INTRAVENOUS
  Filled 2013-10-16 (×2): qty 50

## 2013-10-16 MED ORDER — POTASSIUM CHLORIDE CRYS ER 20 MEQ PO TBCR
40.0000 meq | EXTENDED_RELEASE_TABLET | Freq: Every evening | ORAL | Status: DC
  Administered 2013-10-16 – 2013-10-18 (×3): 40 meq via ORAL
  Filled 2013-10-16 (×4): qty 2

## 2013-10-16 MED ORDER — HYDROMORPHONE HCL 2 MG PO TABS
1.0000 mg | ORAL_TABLET | ORAL | Status: DC | PRN
  Administered 2013-10-16 – 2013-10-19 (×13): 2 mg via ORAL
  Filled 2013-10-16 (×14): qty 1

## 2013-10-16 MED ORDER — SODIUM CHLORIDE 0.9 % IR SOLN
Status: DC | PRN
  Administered 2013-10-16: 1000 mL

## 2013-10-16 SURGICAL SUPPLY — 63 items
BAG DECANTER FOR FLEXI CONT (MISCELLANEOUS) ×2 IMPLANT
BAG SPEC THK2 15X12 ZIP CLS (MISCELLANEOUS)
BAG ZIPLOCK 12X15 (MISCELLANEOUS) ×1 IMPLANT
BANDAGE ELASTIC 6 VELCRO ST LF (GAUZE/BANDAGES/DRESSINGS) ×3 IMPLANT
BANDAGE ESMARK 6X9 LF (GAUZE/BANDAGES/DRESSINGS) ×1 IMPLANT
BLADE SAG 18X100X1.27 (BLADE) ×3 IMPLANT
BLADE SAW SGTL 11.0X1.19X90.0M (BLADE) ×3 IMPLANT
BNDG CMPR 9X6 STRL LF SNTH (GAUZE/BANDAGES/DRESSINGS) ×1
BNDG ESMARK 6X9 LF (GAUZE/BANDAGES/DRESSINGS) ×3
BOWL SMART MIX CTS (DISPOSABLE) ×3 IMPLANT
CAPT RP KNEE ×2 IMPLANT
CEMENT HV SMART SET (Cement) ×6 IMPLANT
CLOSURE WOUND 1/2 X4 (GAUZE/BANDAGES/DRESSINGS) ×2
CUFF TOURN SGL QUICK 34 (TOURNIQUET CUFF) ×3
CUFF TRNQT CYL 34X4X40X1 (TOURNIQUET CUFF) ×1 IMPLANT
DECANTER SPIKE VIAL GLASS SM (MISCELLANEOUS) ×3 IMPLANT
DRAPE EXTREMITY T 121X128X90 (DRAPE) ×3 IMPLANT
DRAPE POUCH INSTRU U-SHP 10X18 (DRAPES) ×3 IMPLANT
DRAPE U-SHAPE 47X51 STRL (DRAPES) ×3 IMPLANT
DRSG ADAPTIC 3X8 NADH LF (GAUZE/BANDAGES/DRESSINGS) ×3 IMPLANT
DRSG PAD ABDOMINAL 8X10 ST (GAUZE/BANDAGES/DRESSINGS) ×3 IMPLANT
DURAPREP 26ML APPLICATOR (WOUND CARE) ×3 IMPLANT
ELECT REM PT RETURN 9FT ADLT (ELECTROSURGICAL) ×3
ELECTRODE REM PT RTRN 9FT ADLT (ELECTROSURGICAL) ×1 IMPLANT
EVACUATOR 1/8 PVC DRAIN (DRAIN) ×3 IMPLANT
FACESHIELD WRAPAROUND (MASK) ×15 IMPLANT
FACESHIELD WRAPAROUND OR TEAM (MASK) ×5 IMPLANT
GAUZE SPONGE 4X4 12PLY STRL (GAUZE/BANDAGES/DRESSINGS) ×3 IMPLANT
GLOVE BIO SURGEON STRL SZ7.5 (GLOVE) ×4 IMPLANT
GLOVE BIO SURGEON STRL SZ8 (GLOVE) ×3 IMPLANT
GLOVE BIOGEL PI IND STRL 6.5 (GLOVE) IMPLANT
GLOVE BIOGEL PI IND STRL 7.5 (GLOVE) IMPLANT
GLOVE BIOGEL PI IND STRL 8 (GLOVE) ×1 IMPLANT
GLOVE BIOGEL PI INDICATOR 6.5 (GLOVE) ×2
GLOVE BIOGEL PI INDICATOR 7.5 (GLOVE) ×2
GLOVE BIOGEL PI INDICATOR 8 (GLOVE) ×4
GLOVE SURG SS PI 7.5 STRL IVOR (GLOVE) ×4 IMPLANT
GOWN STRL REUS W/TWL 2XL LVL3 (GOWN DISPOSABLE) ×2 IMPLANT
GOWN STRL REUS W/TWL XL LVL3 (GOWN DISPOSABLE) ×6 IMPLANT
HANDPIECE INTERPULSE COAX TIP (DISPOSABLE) ×3
IMMOBILIZER KNEE 20 (SOFTGOODS) ×3
IMMOBILIZER KNEE 20 THIGH 36 (SOFTGOODS) ×1 IMPLANT
KIT BASIN OR (CUSTOM PROCEDURE TRAY) ×3 IMPLANT
MANIFOLD NEPTUNE II (INSTRUMENTS) ×3 IMPLANT
NDL SAFETY ECLIPSE 18X1.5 (NEEDLE) ×2 IMPLANT
NEEDLE HYPO 18GX1.5 SHARP (NEEDLE) ×6
PACK TOTAL JOINT (CUSTOM PROCEDURE TRAY) ×3 IMPLANT
PADDING CAST COTTON 6X4 STRL (CAST SUPPLIES) ×8 IMPLANT
POSITIONER SURGICAL ARM (MISCELLANEOUS) ×3 IMPLANT
SET HNDPC FAN SPRY TIP SCT (DISPOSABLE) ×1 IMPLANT
STRIP CLOSURE SKIN 1/2X4 (GAUZE/BANDAGES/DRESSINGS) ×4 IMPLANT
SUCTION FRAZIER 12FR DISP (SUCTIONS) ×3 IMPLANT
SUT MNCRL AB 4-0 PS2 18 (SUTURE) ×3 IMPLANT
SUT VIC AB 2-0 CT1 27 (SUTURE) ×9
SUT VIC AB 2-0 CT1 TAPERPNT 27 (SUTURE) ×3 IMPLANT
SUT VLOC 180 0 24IN GS25 (SUTURE) ×3 IMPLANT
SYRINGE 20CC LL (MISCELLANEOUS) ×3 IMPLANT
SYRINGE 60CC LL (MISCELLANEOUS) ×3 IMPLANT
TOWEL OR 17X26 10 PK STRL BLUE (TOWEL DISPOSABLE) ×3 IMPLANT
TOWEL OR NON WOVEN STRL DISP B (DISPOSABLE) ×2 IMPLANT
TRAY FOLEY CATH 14FRSI W/METER (CATHETERS) ×3 IMPLANT
WATER STERILE IRR 1500ML POUR (IV SOLUTION) ×3 IMPLANT
WRAP KNEE MAXI GEL POST OP (GAUZE/BANDAGES/DRESSINGS) ×3 IMPLANT

## 2013-10-16 NOTE — Progress Notes (Signed)
Clinical Social Work Department CLINICAL SOCIAL WORK PLACEMENT NOTE 10/16/2013  Patient:  [name], [name]  Account Number:  [id number] Admit date:  10/16/2013  Clinical Social Worker:  Werner Lean, LCSW  Date/time:  10/16/2013 03:28 PM  Clinical Social Work is seeking post-discharge placement for this patient at the following level of care:   SKILLED NURSING   (*CSW will update this form in Epic as items are completed)     Patient/family provided with [facility] Department of Clinical Social Work's list of facilities offering this level of care within the geographic area requested by the patient (or if unable, by the patient's family).  10/16/2013  Patient/family informed of their freedom to choose among providers that offer the needed level of care, that participate in Medicare, Medicaid or managed care program needed by the patient, have an available bed and are willing to accept the patient.    Patient/family informed of [facility]' ownership interest in [facility], as well as of the fact that they are under no obligation to receive care at this facility.  PASARR submitted to EDS on 10/16/2013 PASARR number received on 10/16/2013  FL2 transmitted to all facilities in geographic area requested by pt/family on  10/16/2013 FL2 transmitted to all facilities within larger geographic area on   Patient informed that his/her managed care company has contracts with or will negotiate with  certain facilities, including the following:     Patient/family informed of bed offers received:  10/16/2013 Patient chooses bed at [facility] Physician recommends and patient chooses bed at    Patient to be transferred to  on   Patient to be transferred to facility by  Patient and family notified of transfer on  Name of family member notified:    The following physician request were entered in Epic:   Additional Comments:  Werner Lean LCSW [phone]

## 2013-10-16 NOTE — Interval H&P Note (Signed)
History and Physical Interval Note:  10/16/2013 7:14 AM  [name]  has presented today for surgery, with the diagnosis of right knee osteoarthritis  The various methods of treatment have been discussed with the patient and family. After consideration of risks, benefits and other options for treatment, the patient has consented to  Procedure(s): RIGHT TOTAL KNEE ARTHROPLASTY (Right) as a surgical intervention .  The patient's history has been reviewed, patient examined, no change in status, stable for surgery.  I have reviewed the patient's chart and labs.  Questions were answered to the patient's satisfaction.     Gearlean Alf

## 2013-10-16 NOTE — Progress Notes (Signed)
Clinical Social Work Department BRIEF PSYCHOSOCIAL ASSESSMENT 10/16/2013  Patient:  [name],[name]     Account Number:  [id number]     Admit date:  10/16/2013  Clinical Social Worker:  ,, LCSW  Date/Time:  10/16/2013 03:06 PM  Referred by:  Physician  Date Referred:  10/16/2013 Referred for  SNF Placement   Other Referral:   Interview type:  Patient Other interview type:    PSYCHOSOCIAL DATA Living Status:  HUSBAND Admitted from facility:   Level of care:   Primary support name:  Samuel Primary support relationship to patient:  SPOUSE Degree of support available:   supportive    CURRENT CONCERNS Current Concerns  Post-Acute Placement   Other Concerns:    SOCIAL WORK ASSESSMENT / PLAN Pt is an 80 yr old female living with spouse prior to hospitalization. CSW has met with pt / family  to assist with d/c planning. This is a planned admission. Pt has made prior arrangements to have ST Rehab at [facility] following hospital d/c. CSW has contacted SNF and d/c plan has been confirmed pending PT recommendations. CSW will continue to follow to assist with d/c planning needs.   Assessment/plan status:  Psychosocial Support/Ongoing Assessment of Needs Other assessment/ plan:   Information/referral to community resources:   Insurance coverage for SNF and ambulance transport reviewed.    PATIENT'S/FAMILY'S RESPONSE TO PLAN OF CARE: Pt is relieved surgery is over. Her pain is being controlled. Pt has been to [facility] in the past and is looking forward to having her rehab there following d/c. Pt / family appreciate CSW assistance.      LCSW [phone]    

## 2013-10-16 NOTE — Anesthesia Postprocedure Evaluation (Signed)
  Anesthesia Post-op Note  Patient: [name]  Procedure(s) Performed: Procedure(s) (LRB): RIGHT TOTAL KNEE ARTHROPLASTY (Right)  Patient Location: PACU  Anesthesia Type: Spinal  Level of Consciousness: awake and alert   Airway and Oxygen Therapy: Patient Spontanous Breathing  Post-op Pain: mild  Post-op Assessment: Post-op Vital signs reviewed, Patient's Cardiovascular Status Stable, Respiratory Function Stable, Patent Airway and No signs of Nausea or vomiting  Last Vitals:  Filed Vitals:   10/16/13 1245  BP: 112/56  Pulse: 68  Temp: 36.3 C  Resp: 16    Post-op Vital Signs: stable   Complications: No apparent anesthesia complications

## 2013-10-16 NOTE — Anesthesia Preprocedure Evaluation (Addendum)
Anesthesia Evaluation  Patient identified by MRN, date of birth, ID band Patient awake    Reviewed: Allergy & Precautions, H&P , NPO status , Patient's Chart, lab work & pertinent test results  History of Anesthesia Complications (+) Emergence DeliriumNegative for: history of anesthetic complications  Airway Mallampati: II TM Distance: >3 FB Neck ROM: Full    Dental no notable dental hx. (+) Edentulous Upper, Missing, Poor Dentition   Pulmonary sleep apnea , COPD COPD inhaler, former smoker,  breath sounds clear to auscultation  Pulmonary exam normal       Cardiovascular + [name], + Peripheral Vascular Disease and +CHF + dysrhythmias Atrial Fibrillation + pacemaker Rhythm:Regular Rate:Normal     Neuro/Psych PSYCHIATRIC DISORDERS CVA, No Residual Symptoms    GI/Hepatic GERD-  Medicated,  Endo/Other  diabetes, Oral Hypoglycemic Agents  Renal/GU Renal InsufficiencyRenal disease     Musculoskeletal   Abdominal   Peds  Hematology negative hematology ROS (+)   Anesthesia Other Findings   Reproductive/Obstetrics                         Anesthesia Physical  Anesthesia Plan  ASA: III  Anesthesia Plan: Spinal   Post-op Pain Management:    Induction:   Airway Management Planned: Simple Face Mask  Additional Equipment:   Intra-op Plan:   Post-operative Plan:   Informed Consent: I have reviewed the patients History and Physical, chart, labs and discussed the procedure including the risks, benefits and alternatives for the proposed anesthesia with the patient or authorized representative who has indicated his/her understanding and acceptance.   Dental advisory given  Plan Discussed with: CRNA, Anesthesiologist and Surgeon  Anesthesia Plan Comments: (Off Eliquis since Friday. SAB.   Overall, high risk for complications.)      Anesthesia Quick Evaluation

## 2013-10-16 NOTE — Anesthesia Procedure Notes (Signed)
Spinal  Patient location during procedure: OR Start time: 10/16/2013 8:29 AM Staffing CRNA/Resident: Dion Saucier E Performed by: resident/CRNA  Preanesthetic Checklist Completed: patient identified, site marked, surgical consent, pre-op evaluation, timeout performed, IV checked, risks and benefits discussed and monitors and equipment checked Spinal Block Patient position: sitting Prep: Betadine Patient monitoring: heart rate, continuous pulse ox and blood pressure Approach: midline Location: L3-4 Injection technique: single-shot Needle Needle type: Spinocan  Needle gauge: 22 G Needle length: 9 cm Additional Notes Kit expiration date checked and confirmed. Clear csf, good flow, negative heme, paresthesias.  Tolerated well.

## 2013-10-16 NOTE — H&P (View-Only) (Signed)
[name] DOB: 23-Jan-1932 Married / Language: English / Race: White Female Date of Admission:  10-16-2013 Chief Comp;aint:  Right Knee Pain History of Present Illness The patient is a 80 year old female who comes in  for a preoperative history and physical. The patient is scheduled for a right total knee arthroplasty to be performed by Dr. Dione Plover. Aluisio, MD at [facility] on 10/16/2013. The patient is a 80 year old female who presents for follow up of their knee. The patient is being followed for their right knee pain and osteoarthritis.  Symptoms reported today include: pain, aching and difficulty ambulating. The patient feels that they are doing poorly and report their pain level to be moderate to severe. The following medication has been used for pain control: none.  She saw Molli Barrows PA-C weeks ago, requesting a cortisone injection to hold her over until surgery. She was not injected at that time due to her diabetes, as she was not sure what her A1C was at that time. The knee is bothering her at all times.  She is now ready to proceed with surgery for the right knee. They have been treated conservatively in the past for the above stated problem and despite conservative measures, they continue to have progressive pain and severe functional limitations and dysfunction. They have failed non-operative management including home exercise, medications, and injections. It is felt that they would benefit from undergoing total joint replacement. Risks and benefits of the procedure have been discussed with the patient and they elect to proceed with surgery. There are no active contraindications to surgery such as ongoing infection or rapidly progressive neurological disease.  Allergies  Sulfa Drugs Itching.  Problem List/Past Medical Primary osteoarthritis of right knee (715.16  M17.11) Coronary Artery Disease/Heart Disease Peripheral Vascular Disease Congestive Heart  Failure Stroke COPD Sleep Apnea uses CPAP Arrhythmia Pacemaker Varicose veins Diabetes Mellitus, Type II Breast cancer Menopause Measles Childhood Mumps Childhood  Family History Chronic Obstructive Lung Disease Mother.  Social History Marital status married Current work status Retired. Living situation Lives with spouse. Post-Surgical Plans [facility] Advance Directives Living Will, Healthcare POA  Medication History ( Advair Diskus (250-50MCG/DOSE Aero Pow Br Act, Inhalation) Active. Atorvastatin Calcium (20MG  Tablet, Oral) Active. Cilostazol (100MG  Tablet, Oral) Active. Eliquis (5MG  Tablet, Oral) Active. Letrozole (2.5MG  Tablet, Oral) Active. MetFORMIN HCl (500MG  Tablet, Oral) Active. Nasonex (50MCG/ACT Suspension, Nasal) Active. Nystatin (100000 UNIT/ML Suspension, Mouth/Throat) Active. Potassium Chloride Crys ER (20MEQ Tablet ER, Oral) Active. Spiriva HandiHaler (18MCG Capsule, Inhalation) Active. Torsemide (10MG  Tablet, Oral) Active. Atenolol (25MG  Tablet, Oral) Active. Pletal (100MG  Tablet, Oral) Active. Xopenex HFA (45MCG/ACT Aerosol, Inhalation) Active. Mag-Ox (400MG  Tablet, Oral) Active. Vitamin B-12 (1000MCG Tablet, Oral) Active.  Past Surgical History Cataract Surgery left Breast Mass; Local Excision left Leg Circulation Surgery left Vascular Stenting Bilateral Lower Extemities Left Hip Surgery due to Fracture/Accident  Review of Systems  General Not Present- Chills, Fatigue, Fever, Memory Loss, Night Sweats, Weight Gain and Weight Loss. Skin Not Present- Eczema, Hives, Itching, Lesions and Rash. HEENT Not Present- Dentures, Double Vision, Headache, Hearing Loss, Tinnitus and Visual Loss. Respiratory Not Present- Allergies, Chronic Cough, Coughing up blood, Shortness of breath at rest and Shortness of breath with exertion. Cardiovascular Present- Edema and Swelling. Not Present- Chest Pain, Difficulty Breathing  Lying Down, Murmur, Palpitations and Racing/skipping heartbeats. Gastrointestinal Present- Diarrhea, Nausea and Vomiting. Not Present- Abdominal Pain, Bloody Stool, Constipation, Difficulty Swallowing, Heartburn, Jaundice and Loss of appetitie. Female Genitourinary Not Present- Blood  in Urine, Discharge, Flank Pain, Incontinence, Painful Urination, Urgency, Urinary frequency, Urinary Retention, Urinating at Night and Weak urinary stream. Musculoskeletal Present- Muscle Pain. Not Present- Back Pain, Joint Pain, Joint Swelling, Morning Stiffness, Muscle Weakness and Spasms. Neurological Not Present- Blackout spells, Difficulty with balance, Dizziness, Paralysis, Tremor and Weakness. Psychiatric Not Present- Insomnia.   Vitals Weight: 188 lb Height: 65.5in Weight was reported by patient. Height was reported by patient. Body Surface Area: 1.99 m Body Mass Index: 30.81 kg/m Pulse: 74 (Regular)  BP: 112/52 (Sitting, Right Arm, Standard)  Physical Exam General Mental Status -Alert, cooperative and good historian. General Appearance-pleasant, Not in acute distress. Orientation-Oriented X3. Build & Nutrition-Well nourished and Well developed.  Head and Neck Head-normocephalic, atraumatic . Neck Global Assessment - supple, no bruit auscultated on the right, no bruit auscultated on the left.  Eye Pupil - Bilateral-Regular and Round. Motion - Bilateral-EOMI.  Chest and Lung Exam Auscultation Breath sounds - clear at anterior chest wall and clear at posterior chest wall. Adventitious sounds - No Adventitious sounds.  Cardiovascular Auscultation Rhythm - Regular rate and rhythm. Heart Sounds - S1 WNL and S2 WNL. Murmurs & Other Heart Sounds - Auscultation of the heart reveals - No Murmurs.  Abdomen Palpation/Percussion Tenderness - Abdomen is non-tender to palpation. Rigidity (guarding) - Abdomen is soft. Auscultation Auscultation of the abdomen reveals - Bowel  sounds normal.  Female Genitourinary Note: Not done, not pertinent to present illness   Musculoskeletal Note: She is in no distress. Her right knee shows no effusion. There is marked crepitus on range of motion of the right knee with tenderness medial greater than lateral. No instability noted. Edema noted in both lower extremities.   Assessment & Plan  Primary osteoarthritis of right knee (715.16  M17.11) Note:Plan is for a Right Total Knee Replacement by Dr. Wynelle Link.  Plan is to go to [facility]  The patient will receive topical TXA (tranexamic acid) due to: [name], [name], Stroke  Please note that the patient has had some confusion and delirium with anesthesia in the past.  Signed electronically by Ok Edwards, III PA-C

## 2013-10-16 NOTE — Progress Notes (Signed)
PT Cancellation Note  Patient Details Name: [name] MRN: [id number] DOB: 03-29-1931   Cancelled Treatment:    Reason Eval/Treat Not Completed: Pain limiting ability to participate   Leibish Mcgregor,KATHrine E 10/16/2013, 3:49 PM Carmelia Bake, PT, DPT 10/16/2013 Pager: [phone]

## 2013-10-16 NOTE — Progress Notes (Signed)
Pt brought in her home cpap machine and prefers to use it while here in the hospital.  Placed pt on her home unit with 3l o2 bleedin.  RN aware.  No frays on cord or obvious defects noted.  Pt tolerating well at this time.  Sterile water added to humidity chamber.

## 2013-10-16 NOTE — Transfer of Care (Signed)
Immediate Anesthesia Transfer of Care Note  Patient: [name]  Procedure(s) Performed: Procedure(s): RIGHT TOTAL KNEE ARTHROPLASTY (Right)  Patient Location: PACU  Anesthesia Type:Spinal  Level of Consciousness: awake, confused, lethargic and responds to stimulation  Airway & Oxygen Therapy: Patient Spontanous Breathing and Patient connected to face mask oxygen  Post-op Assessment: Report given to PACU RN and Post -op Vital signs reviewed and stable  Post vital signs: Reviewed and stable  Complications: No apparent anesthesia complications

## 2013-10-16 NOTE — Op Note (Signed)
Pre-operative diagnosis- Osteoarthritis Right knee(s)  Post-operative diagnosis- Osteoarthritis  Right knee(s)  Procedure-   Right Total Knee Arthroplasty  Surgeon- Dione Plover. Daquawn Seelman, MD  Assistant- [name], PA-C   Anesthesia-  Spinal   EBL- * No blood loss amount entered *   Drains Hemovac   Tourniquet time  Total Tourniquet Time Documented: Thigh (Right) - 35 minutes Total: Thigh (Right) - 35 minutes    Complications- None  Condition-PACU - hemodynamically stable.   Brief Clinical Note  [name] is a 80 y.o. year old female with end stage OA of her right knee with progressively worsening pain and dysfunction. She has constant pain, with activity and at rest and significant functional deficits with difficulties even with ADLs. She has had extensive non-op management including analgesics, injections of cortisone and viscosupplements, and home exercise program, but remains in significant pain with significant dysfunction.Radiographs show bone on bone arthritis lateral and patellofemoral with valgus deformity. She presents now for right Total Knee Arthroplasty.    Procedure in detail---       The patient is brought into the operating room and positioned supine on the operating table. After successful administration of Spinal anesthetic, a tourniquet is placed high on the Right thigh(s) and the lower extremity is prepped and draped in the usual sterile fashion. Time out is performed by the operating team and then the Right  lower extremity is wrapped in Esmarch, knee flexed and the tourniquet inflated to 300 mmHg.       A midline incision is made with a ten blade through the subcutaneous tissue to the level of the extensor mechanism. A fresh blade is used to make a lateral parapatellar arthrotomy due to the patients' valgus deformity. Soft tissue over the proximal lateral tibia is subperiosteally elevated to the joint line with a knife to the posterolateral corner but not  including the structures of the posterolateral corner. Soft tissue over the proximal medial tibia is elevated with attention being paid to avoiding the patellar tendon on the tibial tubercle. The patella is everted medially, knee flexed 90 degrees and the ACL and PCL are removed. Findings are bone on bone lateral and patellofemoral with large lateral osteophytes. .       The drill is used to create a starting hole in the distal femur and the canal is thoroughly irrigated with sterile saline to remove the fatty contents. The 5 degree Right  valgus alignment guide is placed into the femoral canal and the distal femoral cutting block is pinned to remove 10  mm off the distal femur. Resection is made with an oscillating saw.      The tibia is subluxed forward and the menisci are removed. The extramedullary alignment guide is placed referencing proximally at the medial aspect of the tibial tubercle and distally along the second metatarsal axis and tibial crest. The block is pinned to remove 48mm off the more deficient lateral side. Resection is made with an oscillating saw. Size 3  is the most appropriate size for the tibia and the proximal tibia is prepared with the modular drill and keel punch for that size.      The femoral sizing guide is placed and size 4  is most appropriate. Rotation is marked off the epicondylar axis and confirmed by creating a rectangular flexion gap at 90 degrees. The size 4  cutting block is pinned in this rotation and the anterior, posterior and chamfer cuts are made with the oscillating saw. The  intercondylar block is then placed and that cut is made.      Trial size 3  tibial component, trial size 4  Narrow posterior stabilized femur and a 12.5  mm posterior stabilized rotating platform insert trial is placed. Full extension is achieved with excellent varus/valgus and   anterior/posterior balance throughout full range of motion. The patella is everted and thickness measured to be 22  mm.  Free hand resection is taken to 12 mm, a 35 template is placed, lug holes are drilled, trial patella is placed, and it tracks normally. Osteophytes are removed off the posterior femur with the trial in place. All trials are removed and the cut bone surfaces prepared with pulsatile lavage. Cement is mixed and once ready for implantation, the size 3  tibial implant, size 4 narrow posterior stabilized femoral component, and the size 35  patella are cemented in place and the patella is held with the clamp. The trial insert is placed and the knee held in full extension. The Exparel (20 ml mixed with 30 ml saline) and then 20 ml of .25% Bupivicaine is injected into the extensor mechanism, posterior capsule, medial and lateral gutters and subcutaneous tissues. All extruded cement is removed and once the cement is hard the permanent 12.5  mm posterior stabilized rotating platform insert is placed into the tibial tray.      The wound is copiously irrigated with saline solution and t.he extensor mechanism is closed with interrupted #1 V-loc leaving open a small area from the superior to inferior pole of the patella to serve as a mini lateral release. 2 grams of tranexamic acid mixed with 50 ml saline are then injected into the joint. The tourniquet is then released for a total tourniquet time of 35 minutes.Flexion against gravity is 135  degrees and the patella tracks normally. Subcutaneous tissue is closed with 2.0 vicryl and subcuticular with running 4.0 Monocryl.The incision is cleaned and dried and steri-strips and a bulky sterile dressing are applied. The limb is placed into a knee immobilizer and the patient is awakened and transported to recovery in stable condition.      Please note that a surgical assistant was a medical necessity for this procedure in order to perform it in a safe and expeditious manner. Surgical assistant was necessary to retract the ligaments and vital neurovascular structures to prevent injury  to them and also necessary for proper positioning of the limb to allow for anatomic placement of the prosthesis.    Dione Plover Kayren Holck, MD    10/16/2013, 9:41 AM

## 2013-10-17 ENCOUNTER — Inpatient Hospital Stay (HOSPITAL_COMMUNITY): Payer: Medicare Other

## 2013-10-17 ENCOUNTER — Encounter (HOSPITAL_COMMUNITY): Payer: Self-pay | Admitting: Orthopedic Surgery

## 2013-10-17 LAB — CBC
HCT: 35.5 % — ABNORMAL LOW (ref 36.0–46.0)
Hemoglobin: 11.3 g/dL — ABNORMAL LOW (ref 12.0–15.0)
MCH: 28.7 pg (ref 26.0–34.0)
MCHC: 31.8 g/dL (ref 30.0–36.0)
MCV: 90.1 fL (ref 78.0–100.0)
PLATELETS: 188 10*3/uL (ref 150–400)
RBC: 3.94 MIL/uL (ref 3.87–5.11)
RDW: 15.6 % — AB (ref 11.5–15.5)
WBC: 9.5 10*3/uL (ref 4.0–10.5)

## 2013-10-17 LAB — BASIC METABOLIC PANEL
ANION GAP: 11 (ref 5–15)
BUN: 17 mg/dL (ref 6–23)
CHLORIDE: 104 meq/L (ref 96–112)
CO2: 22 meq/L (ref 19–32)
Calcium: 8.9 mg/dL (ref 8.4–10.5)
Creatinine, Ser: 1.14 mg/dL — ABNORMAL HIGH (ref 0.50–1.10)
GFR calc non Af Amer: 44 mL/min — ABNORMAL LOW (ref >90)
GFR, EST AFRICAN AMERICAN: 51 mL/min — AB (ref >90)
Glucose, Bld: 141 mg/dL — ABNORMAL HIGH (ref 70–99)
POTASSIUM: 4.7 meq/L (ref 3.7–5.3)
Sodium: 137 mEq/L (ref 137–147)

## 2013-10-17 LAB — GLUCOSE, CAPILLARY
GLUCOSE-CAPILLARY: 130 mg/dL — AB (ref 70–99)
GLUCOSE-CAPILLARY: 152 mg/dL — AB (ref 70–99)
GLUCOSE-CAPILLARY: 221 mg/dL — AB (ref 70–99)
Glucose-Capillary: 160 mg/dL — ABNORMAL HIGH (ref 70–99)
Glucose-Capillary: 216 mg/dL — ABNORMAL HIGH (ref 70–99)

## 2013-10-17 MED ORDER — FUROSEMIDE 10 MG/ML IJ SOLN
10.0000 mg | Freq: Once | INTRAMUSCULAR | Status: AC
Start: 2013-10-17 — End: 2013-10-17
  Administered 2013-10-17: 10 mg via INTRAVENOUS
  Filled 2013-10-17: qty 1

## 2013-10-17 MED ORDER — LEVALBUTEROL HCL 0.63 MG/3ML IN NEBU
0.6300 mg | INHALATION_SOLUTION | Freq: Four times a day (QID) | RESPIRATORY_TRACT | Status: DC | PRN
  Administered 2013-10-17: 0.63 mg via RESPIRATORY_TRACT
  Filled 2013-10-17: qty 3

## 2013-10-17 NOTE — Evaluation (Signed)
Physical Therapy Evaluation Patient Details Name: [name] MRN: [id number] DOB: 1931/12/17 Today's Date: 10/17/2013   History of Present Illness  R TKA  Clinical Impression  Pt will benefit from PT to address deficits below; Will certainly need SNF post acute    Follow Up Recommendations SNF    Equipment Recommendations  None recommended by PT    Recommendations for Other Services       Precautions / Restrictions Precautions Precautions: Fall Restrictions Other Position/Activity Restrictions: WBAT      Mobility  Bed Mobility Overal bed mobility: Needs Assistance Bed Mobility: Supine to Sit     Supine to sit: Mod assist     General bed mobility comments: incr time, multi-modal cues for technique  Transfers Overall transfer level: Needs assistance Equipment used: Rolling walker (2 wheeled) Transfers: Sit to/from [facility] Sit to Stand: Mod assist;From elevated surface Stand pivot transfers: Mod assist;+2 safety/equipment       General transfer comment: cues for hand placement and lengthy discussion of getting to chair and out of chair, pillows placed to raise ht of chair  Ambulation/Gait                Stairs            Wheelchair Mobility    Modified Rankin (Stroke Patients Only)       Balance Overall balance assessment: Needs assistance;History of Falls Sitting-balance support: Feet supported;No upper extremity supported Sitting balance-Leahy Scale: Fair     Standing balance support: Bilateral upper extremity supported Standing balance-Leahy Scale: Zero                               Pertinent Vitals/Pain Pain Assessment: 0-10 Pain Score: 5  Pain Intervention(s): Limited activity within patient's tolerance;Monitored during session;Premedicated before session    Home Living Family/patient expects to be discharged to:: Skilled nursing facility                      Prior Function Level  of Independence: Independent         Comments: pt reports she has much difficulty standing from srom surfaces lower than 25 inches     Hand Dominance        Extremity/Trunk Assessment               Lower Extremity Assessment: RLE deficits/detail RLE Deficits / Details: pt in bed with knee flexed; very concerned about this;        Communication   Communication: No difficulties  Cognition Arousal/Alertness: Awake/alert Behavior During Therapy: WFL for tasks assessed/performed Overall Cognitive Status: Within Functional Limits for tasks assessed                      General Comments      Exercises Total Joint Exercises Ankle Circles/Pumps: AROM;Both;10 reps Quad Sets: 10 reps;AROM;Both      Assessment/Plan    PT Assessment Patient needs continued PT services  PT Diagnosis Difficulty walking;Generalized weakness   PT Problem List Decreased strength;Decreased range of motion;Decreased activity tolerance;Decreased balance;Decreased mobility;Decreased knowledge of use of DME;Decreased knowledge of precautions  PT Treatment Interventions Therapeutic exercise;Therapeutic activities;Functional mobility training;DME instruction;Gait training;Patient/family education   PT Goals (Current goals can be found in the Care Plan section) Acute Rehab PT Goals Patient Stated Goal: to get better PT Goal Formulation: With patient Time For Goal Achievement: 10/24/13 Potential to Achieve Goals: Good  Frequency 7X/week   Barriers to discharge        Co-evaluation               End of Session Equipment Utilized During Treatment: Gait belt;Right knee immobilizer Activity Tolerance: Patient tolerated treatment well Patient left: in chair;with family/visitor present;with call bell/phone within reach Nurse Communication: Mobility status         Time: 3276-1470 PT Time Calculation (min): 39 min   Charges:   PT Evaluation $Initial PT Evaluation Tier I: 1  Procedure PT Treatments $Therapeutic Activity: 23-37 mins   PT G Codes:          [name] October 23, 2013, 1:07 PM

## 2013-10-17 NOTE — Progress Notes (Signed)
RT placed pt on home CPAP machine with 4L of oxygen bled in. Sterile water was added for humidification. Pt looks comfortable and is tolerating CPAP well at this time. RT will continue to monitor as needed.

## 2013-10-17 NOTE — Progress Notes (Signed)
Subjective: 1 Day Post-Op Procedure(s) (LRB): RIGHT TOTAL KNEE ARTHROPLASTY (Right) Patient reports pain as mild and moderate.   Patient seen in rounds with Dr. Wynelle Link. Patient is well, but has had some minor complaints of pain in the knee, requiring pain medications We will start therapy today.  Plan is to go [facility] after hospital stay.  Objective: Vital signs in last 24 hours: Temp:  [96.6 F (35.9 C)-98.3 F (36.8 C)] 98.3 F (36.8 C) (08/11 0610) Pulse Rate:  [68-76] 75 (08/11 0610) Resp:  [14-18] 14 (08/11 0610) BP: (97-128)/(49-81) 125/58 mmHg (08/11 0610) SpO2:  [88 %-100 %] 91 % (08/11 0610) Weight:  [86.637 kg (191 lb)] 86.637 kg (191 lb) (08/10 1145)  Intake/Output from previous day:  Intake/Output Summary (Last 24 hours) at 10/17/13 0713 Last data filed at 10/17/13 0649  Gross per 24 hour  Intake 3112.5 ml  Output   1640 ml  Net 1472.5 ml    Intake/Output this shift: 575 cc since MN  Labs:  Recent Labs  10/17/13 0450  HGB 11.3*    Recent Labs  10/17/13 0450  WBC 9.5  RBC 3.94  HCT 35.5*  PLT 188    Recent Labs  10/17/13 0450  NA 137  K 4.7  CL 104  CO2 22  BUN 17  CREATININE 1.14*  GLUCOSE 141*  CALCIUM 8.9   No results found for this basename: LABPT, INR,  in the last 72 hours  EXAM General - Patient is Alert, Appropriate and Oriented Extremity - Neurovascular intact Sensation intact distally Dorsiflexion/Plantar flexion intact Dressing - dressing C/D/I Motor Function - intact, moving foot and toes well on exam.  Hemovac pulled without difficulty.  Past Medical History  Diagnosis Date  . Sleep apnea     associated with hypersomnia  . Amiodarone pulmonary toxicity   . Chronic renal insufficiency, stage III (moderate)     CrCl about 60 ml/min  . Diabetes   . Tobacco abuse   . Diastolic heart failure     Acute on Chronic  . Pacemaker     Permanent  . AF (atrial fibrillation)      AV ablation 9/09 [place] per Dr  Ola Spurr - AV node ablation 9/11 Dr Caryl Comes  . Hyperlipidemia   . GERD (gastroesophageal reflux disease)   . [name] (coronary artery disease)     (not sure of this 11/10  . COPD (chronic obstructive pulmonary disease)     emphysema -FeV1 73% DLCO 53% 5/09  . Invasive ductal carcinoma of breast 2011    LEFT   . OA (osteoarthritis) of knee     RIGHT  . Right sided sciatica   . Sinoatrial node dysfunction   . CHF (congestive heart failure)   . Mental disorder   . Acute blood loss anemia 04/20/2012  . Depression 06/06/2012  . Hx of cardiovascular stress test     Lexiscan Myoview (09/2013):  No definite ischemia, EF 67%; low risk  . Complication of anesthesia     "psycotic episode" after hip surg - resolved  . PAD (peripheral artery disease)     w/hx right iliac/SFA stenting and left and rt leg PTA  . CVA (cerebral vascular accident)     no residual effects evident to pt   . History of skin cancer   . Eczema     Assessment/Plan: 1 Day Post-Op Procedure(s) (LRB): RIGHT TOTAL KNEE ARTHROPLASTY (Right) Principal Problem:   OA (osteoarthritis) of knee  Estimated body mass index  is 31.78 kg/(m^2) as calculated from the following:   Height as of this encounter: 5\' 5"  (1.651 m).   Weight as of this encounter: 86.637 kg (191 lb). Advance diet Up with therapy Discharge to SNF - [facility]  DVT Prophylaxis - Xarelto Weight-Bearing as tolerated to right leg D/C O2 and Pulse OX and try on Room Air  Please note that the patient is on Eliquis at home for her A.Fib.  She was started on Xarelto postop for DVT prophylaxis in order to satisfy SCIP protocols when in house.  She will be switched back to Eliquis at home following discharge.  [name], PA-C Orthopaedic Surgery 10/17/2013, 7:13 AM

## 2013-10-17 NOTE — Progress Notes (Signed)
10/17/13 1700  PT Visit Information  Last PT Received On 10/17/13  Assistance Needed +2  History of Present Illness R TKA  PT Time Calculation  PT Start Time 1600  PT Stop Time 1613  PT Time Calculation (min) 13 min  Subjective Data  Subjective I just don't know  Patient Stated Goal to get better  Precautions  Precautions Fall  Restrictions  Other Position/Activity Restrictions WBAT  Cognition  Arousal/Alertness Awake/alert  Behavior During Therapy WFL for tasks assessed/performed  Overall Cognitive Status Within Functional Limits for tasks assessed  Total Joint Exercises  Ankle Circles/Pumps AROM;Both;10 reps  Quad Sets 10 reps;AROM;Both  Heel Slides AAROM;Right;10 reps  Hip ABduction/ADduction AAROM;Right;10 reps  Straight Leg Raises AAROM;10 reps;Right  PT - End of Session  Activity Tolerance Patient tolerated treatment well  Pt with sats 85-89% throughout exercise session 2L O2; family reports replacing O2 due to monitor alarming; pt also with productive cough; reviewed use of IS with pt and family; pt completed 10 breaths;  Patient left in bed;with call bell/phone within reach;with family/visitor present (returned to [facility])  Nurse Communication Mobility status   PT - Assessment/Plan  PT Plan Current plan remains appropriate  PT Frequency 7X/week  Follow Up Recommendations SNF  PT equipment None recommended by PT  PT Goal Progression  Progress towards PT goals Progressing toward goals  Acute Rehab PT Goals  PT Goal Formulation With patient  Time For Goal Achievement 10/24/13  Potential to Achieve Goals Good  PT General Charges  $$ ACUTE PT VISIT 1 Procedure  PT Treatments  $Therapeutic Exercise 8-22 mins

## 2013-10-17 NOTE — Progress Notes (Signed)
Pts Oxygen sats dipping into low 80's while on 2l [place], Lung sounds have become wet/crackles. Notified PA, await orders and cont to monitor

## 2013-10-17 NOTE — Progress Notes (Signed)
OT Cancellation Note  Patient Details Name: [name] MRN: [id number] DOB: 1931/05/19   Cancelled Treatment:    Reason Eval/Treat Not Completed: Other (comment) Note plan for SNF. Will defer to SNF.  Jules Schick [phone] 10/17/2013, 12:24 PM

## 2013-10-18 LAB — BASIC METABOLIC PANEL
Anion gap: 15 (ref 5–15)
BUN: 18 mg/dL (ref 6–23)
CALCIUM: 9.3 mg/dL (ref 8.4–10.5)
CO2: 21 mEq/L (ref 19–32)
Chloride: 103 mEq/L (ref 96–112)
Creatinine, Ser: 0.85 mg/dL (ref 0.50–1.10)
GFR, EST AFRICAN AMERICAN: 73 mL/min — AB (ref >90)
GFR, EST NON AFRICAN AMERICAN: 63 mL/min — AB (ref >90)
GLUCOSE: 269 mg/dL — AB (ref 70–99)
Potassium: 4.5 mEq/L (ref 3.7–5.3)
SODIUM: 139 meq/L (ref 137–147)

## 2013-10-18 LAB — CBC
HCT: 37 % (ref 36.0–46.0)
HEMOGLOBIN: 12 g/dL (ref 12.0–15.0)
MCH: 28.4 pg (ref 26.0–34.0)
MCHC: 32.4 g/dL (ref 30.0–36.0)
MCV: 87.7 fL (ref 78.0–100.0)
PLATELETS: 186 10*3/uL (ref 150–400)
RBC: 4.22 MIL/uL (ref 3.87–5.11)
RDW: 15.5 % (ref 11.5–15.5)
WBC: 11.4 10*3/uL — ABNORMAL HIGH (ref 4.0–10.5)

## 2013-10-18 LAB — GLUCOSE, CAPILLARY
GLUCOSE-CAPILLARY: 209 mg/dL — AB (ref 70–99)
GLUCOSE-CAPILLARY: 167 mg/dL — AB (ref 70–99)
GLUCOSE-CAPILLARY: 191 mg/dL — AB (ref 70–99)
GLUCOSE-CAPILLARY: 136 mg/dL — AB (ref 70–99)

## 2013-10-18 NOTE — Progress Notes (Signed)
Physical Therapy Treatment Patient Details Name: [name] MRN: [id number] DOB: September 22, 1931 Today's Date: 02-Nov-2013    History of Present Illness R TKA    PT Comments    Marked improvement in activity tolerance.  Follow Up Recommendations  SNF     Equipment Recommendations  None recommended by PT    Recommendations for Other Services OT consult     Precautions / Restrictions Precautions Precautions: Fall Restrictions Weight Bearing Restrictions: No Other Position/Activity Restrictions: WBAT    Mobility  Bed Mobility Overal bed mobility: Needs Assistance Bed Mobility: Supine to Sit     Supine to sit: Mod assist     General bed mobility comments: incr time, multi-modal cues for technique  Transfers Overall transfer level: Needs assistance Equipment used: Rolling walker (2 wheeled) Transfers: Sit to/from Stand Sit to Stand: Mod assist;From elevated surface         General transfer comment: cues for LE management and use of UEs to self assist  Ambulation/Gait Ambulation/Gait assistance: Mod assist;+2 safety/equipment Ambulation Distance (Feet): 30 Feet (and 4) Assistive device: Rolling walker (2 wheeled) Gait Pattern/deviations: Step-to pattern;Decreased step length - right;Decreased step length - left;Shuffle;Trunk flexed Gait velocity: decr   General Gait Details: cues for sequence, stride length, posture, position from [employer]            Wheelchair Mobility    Modified Rankin (Stroke Patients Only)       Balance                                    Cognition Arousal/Alertness: Awake/alert Behavior During Therapy: WFL for tasks assessed/performed Overall Cognitive Status: Within Functional Limits for tasks assessed                      Exercises Total Joint Exercises Ankle Circles/Pumps: AROM;Both;10 reps Quad Sets: AROM;Both;15 reps;Supine Heel Slides: AAROM;Right;15 reps;Supine Straight Leg  Raises: AAROM;Right;Supine;15 reps    General Comments        Pertinent Vitals/Pain Pain Assessment: 0-10 Pain Score: 5  Pain Location: R TKR Pain Descriptors / Indicators: Aching Pain Intervention(s): Limited activity within patient's tolerance;Premedicated before session;Ice applied    Home Living                      Prior Function            PT Goals (current goals can now be found in the care plan section) Acute Rehab PT Goals Patient Stated Goal: to get better PT Goal Formulation: With patient Time For Goal Achievement: 10/24/13 Potential to Achieve Goals: Good Progress towards PT goals: Progressing toward goals    Frequency  7X/week    PT Plan Current plan remains appropriate    Co-evaluation             End of Session Equipment Utilized During Treatment: Gait belt;Right knee immobilizer Activity Tolerance: Patient tolerated treatment well Patient left: in chair;with call bell/phone within reach     Time: 1035-1125 PT Time Calculation (min): 50 min  Charges:  $Gait Training: 8-22 mins $Therapeutic Exercise: 8-22 mins $Therapeutic Activity: 8-22 mins                    G Codes:      [name] 11/02/2013, 12:52 PM

## 2013-10-18 NOTE — Progress Notes (Signed)
Physical Therapy Treatment Patient Details Name: [name] MRN: [id number] DOB: 1931/12/28 Today's Date: 10-30-2013    History of Present Illness R TKA    PT Comments      Follow Up Recommendations  SNF     Equipment Recommendations  None recommended by PT    Recommendations for Other Services OT consult     Precautions / Restrictions Precautions Precautions: Fall Restrictions Weight Bearing Restrictions: No Other Position/Activity Restrictions: WBAT    Mobility  Bed Mobility Overal bed mobility: Needs Assistance Bed Mobility: Supine to Sit;Sit to Supine     Supine to sit: Min assist;Mod assist;HOB elevated Sit to supine: Min assist;Mod assist   General bed mobility comments: incr time, multi-modal cues for technique  Transfers Overall transfer level: Needs assistance Equipment used: Rolling walker (2 wheeled) Transfers: Sit to/from Stand Sit to Stand: Mod assist;From elevated surface         General transfer comment: cues for LE management and use of UEs to self assist  Ambulation/Gait Ambulation/Gait assistance: Min assist;Mod assist;+2 safety/equipment Ambulation Distance (Feet): 44 Feet (and 4') Assistive device: Rolling walker (2 wheeled) Gait Pattern/deviations: Step-to pattern;Decreased step length - right;Decreased step length - left;Shuffle;Trunk flexed Gait velocity: decr   General Gait Details: cues for sequence, stride length, posture, position from [employer]            Wheelchair Mobility    Modified Rankin (Stroke Patients Only)       Balance                                    Cognition Arousal/Alertness: Awake/alert Behavior During Therapy: WFL for tasks assessed/performed Overall Cognitive Status: Within Functional Limits for tasks assessed                      Exercises      General Comments        Pertinent Vitals/Pain Pain Assessment: 0-10 Pain Score: 4  Pain Location: R  TKR Pain Descriptors / Indicators: Aching Pain Intervention(s): Limited activity within patient's tolerance;Premedicated before session;Ice applied;Monitored during session    Home Living                      Prior Function            PT Goals (current goals can now be found in the care plan section) Acute Rehab PT Goals Patient Stated Goal: to get better PT Goal Formulation: With patient Time For Goal Achievement: 10/24/13 Potential to Achieve Goals: Good Progress towards PT goals: Progressing toward goals    Frequency  7X/week    PT Plan Current plan remains appropriate    Co-evaluation             End of Session Equipment Utilized During Treatment: Gait belt;Right knee immobilizer Activity Tolerance: Patient tolerated treatment well Patient left: with call bell/phone within reach;in bed     Time: 8938-1017 PT Time Calculation (min): 34 min  Charges:  $Gait Training: 8-22 mins $Therapeutic Activity: 8-22 mins                    G Codes:      Anndee Connett 10/30/13, 4:47 PM

## 2013-10-18 NOTE — Progress Notes (Signed)
Subjective: 2 Days Post-Op Procedure(s) (LRB): RIGHT TOTAL KNEE ARTHROPLASTY (Right) Patient reports pain as mild.   Patient seen in rounds by Dr. Wynelle Link. Patient is well, but has had some minor complaints of pain in the knee, requiring pain medications. O2 sats are improved today, back in the 90's Plan is to go [facility] after hospital stay.  Objective: Vital signs in last 24 hours: Temp:  [97.7 F (36.5 C)-98.4 F (36.9 C)] 97.7 F (36.5 C) (08/12 0445) Pulse Rate:  [69-77] 77 (08/12 0445) Resp:  [16-18] 18 (08/12 0445) BP: (94-143)/(50-70) 143/70 mmHg (08/12 0445) SpO2:  [84 %-93 %] 93 % (08/12 0930)  Intake/Output from previous day:  Intake/Output Summary (Last 24 hours) at 10/18/13 1334 Last data filed at 10/18/13 1051  Gross per 24 hour  Intake 1361.17 ml  Output   2100 ml  Net -738.83 ml    Intake/Output this shift: Total I/O In: 360 [P.O.:360] Out: -   Labs:  Recent Labs  10/17/13 0450 10/18/13 0515  HGB 11.3* 12.0    Recent Labs  10/17/13 0450 10/18/13 0515  WBC 9.5 11.4*  RBC 3.94 4.22  HCT 35.5* 37.0  PLT 188 186    Recent Labs  10/17/13 0450 10/18/13 0515  NA 137 139  K 4.7 4.5  CL 104 103  CO2 22 21  BUN 17 18  CREATININE 1.14* 0.85  GLUCOSE 141* 269*  CALCIUM 8.9 9.3   No results found for this basename: LABPT, INR,  in the last 72 hours  EXAM General - Patient is Alert and Appropriate Extremity - Neurovascular intact Sensation intact distally Dressing/Incision - clean, dry, no drainage Motor Function - intact, moving foot and toes well on exam.   Past Medical History  Diagnosis Date  . Sleep apnea     associated with hypersomnia  . Amiodarone pulmonary toxicity   . Chronic renal insufficiency, stage III (moderate)     CrCl about 60 ml/min  . Diabetes   . Tobacco abuse   . Diastolic heart failure     Acute on Chronic  . Pacemaker     Permanent  . AF (atrial fibrillation)      AV ablation 9/09 [place] per Dr  Ola Spurr - AV node ablation 9/11 Dr Caryl Comes  . Hyperlipidemia   . GERD (gastroesophageal reflux disease)   . [name] (coronary artery disease)     (not sure of this 11/10  . COPD (chronic obstructive pulmonary disease)     emphysema -FeV1 73% DLCO 53% 5/09  . Invasive ductal carcinoma of breast 2011    LEFT   . OA (osteoarthritis) of knee     RIGHT  . Right sided sciatica   . Sinoatrial node dysfunction   . CHF (congestive heart failure)   . Mental disorder   . Acute blood loss anemia 04/20/2012  . Depression 06/06/2012  . Hx of cardiovascular stress test     Lexiscan Myoview (09/2013):  No definite ischemia, EF 67%; low risk  . Complication of anesthesia     "psycotic episode" after hip surg - resolved  . PAD (peripheral artery disease)     w/hx right iliac/SFA stenting and left and rt leg PTA  . CVA (cerebral vascular accident)     no residual effects evident to pt   . History of skin cancer   . Eczema     Assessment/Plan: 2 Days Post-Op Procedure(s) (LRB): RIGHT TOTAL KNEE ARTHROPLASTY (Right) Principal Problem:   OA (osteoarthritis) of  knee  Estimated body mass index is 31.78 kg/(m^2) as calculated from the following:   Height as of this encounter: 5\' 5"  (1.651 m).   Weight as of this encounter: 86.637 kg (191 lb). Up with therapy Plan for discharge tomorrow Discharge to SNF  DVT Prophylaxis - Xarelto Weight-Bearing as tolerated to right leg  Please note that the patient is on Eliquis at home for her A.Fib. She was started on Xarelto postop for DVT prophylaxis in order to satisfy SCIP protocols when in house. She will be switched back to Eliquis at home following discharge.  [name], PA-C Orthopaedic Surgery 10/18/2013, 1:34 PM

## 2013-10-18 NOTE — Progress Notes (Signed)
Pt placed on home CPAP with 4 LPM O2 bleed in.  Pt tolerating well at this time, RT to monitor and assess as needed.

## 2013-10-19 DIAGNOSIS — I5033 Acute on chronic diastolic (congestive) heart failure: Secondary | ICD-10-CM | POA: Diagnosis not present

## 2013-10-19 DIAGNOSIS — M25569 Pain in unspecified knee: Secondary | ICD-10-CM | POA: Diagnosis not present

## 2013-10-19 DIAGNOSIS — Z950 Presence of cardiac pacemaker: Secondary | ICD-10-CM | POA: Diagnosis not present

## 2013-10-19 DIAGNOSIS — K219 Gastro-esophageal reflux disease without esophagitis: Secondary | ICD-10-CM | POA: Diagnosis not present

## 2013-10-19 DIAGNOSIS — M6281 Muscle weakness (generalized): Secondary | ICD-10-CM | POA: Diagnosis not present

## 2013-10-19 DIAGNOSIS — I5032 Chronic diastolic (congestive) heart failure: Secondary | ICD-10-CM | POA: Diagnosis not present

## 2013-10-19 DIAGNOSIS — M1710 Unilateral primary osteoarthritis, unspecified knee: Secondary | ICD-10-CM | POA: Diagnosis not present

## 2013-10-19 DIAGNOSIS — E119 Type 2 diabetes mellitus without complications: Secondary | ICD-10-CM | POA: Diagnosis not present

## 2013-10-19 DIAGNOSIS — J449 Chronic obstructive pulmonary disease, unspecified: Secondary | ICD-10-CM | POA: Diagnosis not present

## 2013-10-19 DIAGNOSIS — R269 Unspecified abnormalities of gait and mobility: Secondary | ICD-10-CM | POA: Diagnosis not present

## 2013-10-19 DIAGNOSIS — I4891 Unspecified atrial fibrillation: Secondary | ICD-10-CM | POA: Diagnosis not present

## 2013-10-19 DIAGNOSIS — G4730 Sleep apnea, unspecified: Secondary | ICD-10-CM | POA: Diagnosis not present

## 2013-10-19 DIAGNOSIS — N183 Chronic kidney disease, stage 3 unspecified: Secondary | ICD-10-CM | POA: Diagnosis not present

## 2013-10-19 DIAGNOSIS — Z96659 Presence of unspecified artificial knee joint: Secondary | ICD-10-CM | POA: Diagnosis not present

## 2013-10-19 DIAGNOSIS — I739 Peripheral vascular disease, unspecified: Secondary | ICD-10-CM | POA: Diagnosis not present

## 2013-10-19 DIAGNOSIS — Z471 Aftercare following joint replacement surgery: Secondary | ICD-10-CM | POA: Diagnosis not present

## 2013-10-19 DIAGNOSIS — R279 Unspecified lack of coordination: Secondary | ICD-10-CM | POA: Diagnosis not present

## 2013-10-19 DIAGNOSIS — B370 Candidal stomatitis: Secondary | ICD-10-CM | POA: Diagnosis not present

## 2013-10-19 DIAGNOSIS — E1159 Type 2 diabetes mellitus with other circulatory complications: Secondary | ICD-10-CM | POA: Diagnosis not present

## 2013-10-19 DIAGNOSIS — S8990XA Unspecified injury of unspecified lower leg, initial encounter: Secondary | ICD-10-CM | POA: Diagnosis not present

## 2013-10-19 DIAGNOSIS — D62 Acute posthemorrhagic anemia: Secondary | ICD-10-CM | POA: Diagnosis not present

## 2013-10-19 LAB — GLUCOSE, CAPILLARY
Glucose-Capillary: 114 mg/dL — ABNORMAL HIGH (ref 70–99)
Glucose-Capillary: 149 mg/dL — ABNORMAL HIGH (ref 70–99)

## 2013-10-19 LAB — CBC
HCT: 34.3 % — ABNORMAL LOW (ref 36.0–46.0)
Hemoglobin: 11.5 g/dL — ABNORMAL LOW (ref 12.0–15.0)
MCH: 29.2 pg (ref 26.0–34.0)
MCHC: 33.5 g/dL (ref 30.0–36.0)
MCV: 87.1 fL (ref 78.0–100.0)
Platelets: 220 10*3/uL (ref 150–400)
RBC: 3.94 MIL/uL (ref 3.87–5.11)
RDW: 15.7 % — AB (ref 11.5–15.5)
WBC: 11.8 10*3/uL — ABNORMAL HIGH (ref 4.0–10.5)

## 2013-10-19 MED ORDER — BISACODYL 10 MG RE SUPP: 10.0000 mg | Freq: Every day | RECTAL | Status: DC | PRN

## 2013-10-19 MED ORDER — HYDROMORPHONE HCL 2 MG PO TABS: 1.0000 mg | ORAL_TABLET | ORAL | Status: DC | PRN

## 2013-10-19 MED ORDER — POLYETHYLENE GLYCOL 3350 17 G PO PACK: 17.0000 g | PACK | Freq: Every day | ORAL | Status: DC | PRN

## 2013-10-19 MED ORDER — METOCLOPRAMIDE HCL 5 MG PO TABS: 5.0000 mg | ORAL_TABLET | Freq: Three times a day (TID) | ORAL | Status: DC | PRN

## 2013-10-19 MED ORDER — ONDANSETRON HCL 4 MG PO TABS: 4.0000 mg | ORAL_TABLET | Freq: Four times a day (QID) | ORAL | Status: DC | PRN

## 2013-10-19 MED ORDER — DSS 100 MG PO CAPS: 100.0000 mg | ORAL_CAPSULE | Freq: Two times a day (BID) | ORAL | Status: DC

## 2013-10-19 MED ORDER — ACETAMINOPHEN 325 MG PO TABS: 650.0000 mg | ORAL_TABLET | Freq: Four times a day (QID) | ORAL | Status: DC | PRN

## 2013-10-19 MED ORDER — METHOCARBAMOL 500 MG PO TABS: 500.0000 mg | ORAL_TABLET | Freq: Four times a day (QID) | ORAL | Status: DC | PRN

## 2013-10-19 MED ORDER — TRAMADOL HCL 50 MG PO TABS: 50.0000 mg | ORAL_TABLET | Freq: Four times a day (QID) | ORAL | Status: DC | PRN

## 2013-10-19 NOTE — Progress Notes (Signed)
CARE MANAGEMENT NOTE 10/19/2013  Patient:  [name], [name]   Account Number:  [id number]  Date Initiated:  10/16/2013  Documentation initiated by:  DAVIS,RHONDA  Subjective/Objective Assessment:   rt total knee     Action/Plan:   snf   Anticipated DC Date:  10/19/2013   Anticipated DC Plan:  SKILLED NURSING FACILITY  In-house referral  Clinical Social Worker      DC Planning Services  NA      [facility] Choice  NA   Choice offered to / List presented to:  NA   DME arranged  NA      DME agency  NA     [place] arranged  NA      [place] agency  NA   Status of service:  Completed, signed off Medicare Important Message given?  NA - LOS <3 / Initial given by admissions (If response is "NO", the following Medicare IM given date fields will be blank) Date Medicare IM given:  10/19/2013 Medicare IM given by:  DAVIS,RHONDA Date Additional Medicare IM given:   Additional Medicare IM given by:    Discharge Disposition:  [place]  Per UR Regulation:  Reviewed for med. necessity/level of care/duration of stay  If discussed at [place] of Stay Meetings, dates discussed:    Comments:  Velva Harman, RN,BSN,CCM

## 2013-10-19 NOTE — Progress Notes (Signed)
Physical Therapy Treatment Patient Details Name: [name] MRN: [id number] DOB: [birth year] Today's Date: 2013/11/04    History of Present Illness R TKA    PT Comments    Progressing steadily  Follow Up Recommendations  SNF     Equipment Recommendations  None recommended by PT    Recommendations for Other Services OT consult     Precautions / Restrictions Precautions Precautions: Fall Restrictions Weight Bearing Restrictions: No Other Position/Activity Restrictions: WBAT    Mobility  Bed Mobility Overal bed mobility: Needs Assistance Bed Mobility: Supine to Sit     Supine to sit: Min assist;HOB elevated     General bed mobility comments: incr time, multi-modal cues for technique  Transfers Overall transfer level: Needs assistance Equipment used: Rolling walker (2 wheeled) Transfers: Sit to/from Stand Sit to Stand: Min assist;From elevated surface         General transfer comment: cues for LE management and use of UEs to self assist  Ambulation/Gait Ambulation/Gait assistance: Min assist Ambulation Distance (Feet): 76 Feet Assistive device: Rolling walker (2 wheeled) Gait Pattern/deviations: Step-to pattern;Decreased step length - right;Decreased step length - left;Shuffle;Trunk flexed Gait velocity: decr   General Gait Details: cues for sequence, stride length, posture, position from [employer]            Wheelchair Mobility    Modified Rankin (Stroke Patients Only)       Balance                                    Cognition Arousal/Alertness: Awake/alert Behavior During Therapy: WFL for tasks assessed/performed Overall Cognitive Status: Within Functional Limits for tasks assessed                      Exercises Total Joint Exercises Ankle Circles/Pumps: AROM;Both;20 reps;Supine Quad Sets: AROM;Both;Supine;20 reps Heel Slides: AAROM;Right;Supine;20 reps Straight Leg Raises: AAROM;Right;Supine;20  reps    General Comments        Pertinent Vitals/Pain Pain Assessment: 0-10 Pain Score: 5  Pain Location: R knee Pain Descriptors / Indicators: Aching Pain Intervention(s): Limited activity within patient's tolerance;Monitored during session;Ice applied;Premedicated before session    Home Living                      Prior Function            PT Goals (current goals can now be found in the care plan section) Acute Rehab PT Goals Patient Stated Goal: to get better PT Goal Formulation: With patient Time For Goal Achievement: 10/24/13 Potential to Achieve Goals: Good Progress towards PT goals: Progressing toward goals    Frequency  7X/week    PT Plan Current plan remains appropriate    Co-evaluation             End of Session Equipment Utilized During Treatment: Gait belt;Right knee immobilizer Activity Tolerance: Patient tolerated treatment well Patient left: with call bell/phone within reach;in chair     Time: 2330-0762 PT Time Calculation (min): 43 min  Charges:  $Gait Training: 8-22 mins $Therapeutic Exercise: 8-22 mins $Therapeutic Activity: 8-22 mins                    G Codes:      Floree Zuniga 2013/11/04, 10:13 AM

## 2013-10-19 NOTE — Progress Notes (Addendum)
Subjective: 3 Days Post-Op Procedure(s) (LRB): RIGHT TOTAL KNEE ARTHROPLASTY (Right) Patient reports pain as mild.   Patient seen in rounds by Dr. Wynelle Link. Patient is well, and has had no acute complaints or problems Patient is ready to go to [facility] today.  Objective: Vital signs in last 24 hours: Temp:  [97.9 F (36.6 C)-98.2 F (36.8 C)] 98.2 F (36.8 C) (08/13 0550) Pulse Rate:  [76-77] 77 (08/13 0550) Resp:  [16-20] 16 (08/13 0550) BP: (133-142)/(56-63) 133/61 mmHg (08/13 0550) SpO2:  [93 %-99 %] 97 % (08/13 0550)  Intake/Output from previous day:  Intake/Output Summary (Last 24 hours) at 10/19/13 0726 Last data filed at 10/19/13 0550  Gross per 24 hour  Intake    720 ml  Output   2075 ml  Net  -1355 ml    Intake/Output this shift:    Labs:  Recent Labs  10/17/13 0450 10/18/13 0515 10/19/13 0432  HGB 11.3* 12.0 11.5*    Recent Labs  10/18/13 0515 10/19/13 0432  WBC 11.4* 11.8*  RBC 4.22 3.94  HCT 37.0 34.3*  PLT 186 220    Recent Labs  10/17/13 0450 10/18/13 0515  NA 137 139  K 4.7 4.5  CL 104 103  CO2 22 21  BUN 17 18  CREATININE 1.14* 0.85  GLUCOSE 141* 269*  CALCIUM 8.9 9.3   No results found for this basename: LABPT, INR,  in the last 72 hours  EXAM: General - Patient is Alert and Appropriate Extremity - Neurovascular intact Sensation intact distally Dorsiflexion/Plantar flexion intact Incision - clean, dry, no drainage, healing Motor Function - intact, moving foot and toes well on exam.   Assessment/Plan: 3 Days Post-Op Procedure(s) (LRB): RIGHT TOTAL KNEE ARTHROPLASTY (Right) Procedure(s) (LRB): RIGHT TOTAL KNEE ARTHROPLASTY (Right) Past Medical History  Diagnosis Date  . Sleep apnea     associated with hypersomnia  . Amiodarone pulmonary toxicity   . Chronic renal insufficiency, stage III (moderate)     CrCl about 60 ml/min  . Diabetes   . Tobacco abuse   . Diastolic heart failure     Acute on Chronic  .  Pacemaker     Permanent  . AF (atrial fibrillation)      AV ablation 9/09 [facility] per Dr Ola Spurr - AV node ablation 9/11 Dr Caryl Comes  . Hyperlipidemia   . GERD (gastroesophageal reflux disease)   . [name] (coronary artery disease)     (not sure of this 11/10  . COPD (chronic obstructive pulmonary disease)     emphysema -FeV1 73% DLCO 53% 5/09  . Invasive ductal carcinoma of breast 2011    LEFT   . OA (osteoarthritis) of knee     RIGHT  . Right sided sciatica   . Sinoatrial node dysfunction   . CHF (congestive heart failure)   . Mental disorder   . Acute blood loss anemia 04/20/2012  . Depression 06/06/2012  . Hx of cardiovascular stress test     Lexiscan Myoview (09/2013):  No definite ischemia, EF 67%; low risk  . Complication of anesthesia     "psycotic episode" after hip surg - resolved  . PAD (peripheral artery disease)     w/hx right iliac/SFA stenting and left and rt leg PTA  . CVA (cerebral vascular accident)     no residual effects evident to pt   . History of skin cancer   . Eczema    Principal Problem:   OA (osteoarthritis) of knee  Estimated body  mass index is 31.78 kg/(m^2) as calculated from the following:   Height as of this encounter: 5\' 5"  (1.651 m).   Weight as of this encounter: 86.637 kg (191 lb). Up with therapy Discharge to SNF - [place] and Diabetic diet Follow up - in 1 week on Thursday 10/26/2013 Activity - WBAT Disposition - Skilled nursing facility Condition Upon Discharge - Good D/C Meds - See DC Summary DVT Prophylaxis - Xarelto while in hospital, will switch back to Eliquis following discharge  [name], PA-C Orthopaedic Surgery 10/19/2013, 7:26 AM

## 2013-10-19 NOTE — Clinical Social Work Note (Signed)
Patient for d/c today to SNF bed at [facility].Family and patient agreeable to this plan- patient eager to continue her rehab and get back home with her husband who has some early dementia and staying with daughter currently. Will plan transfer via EMS. Eduard Clos, MSW, [place]

## 2013-10-19 NOTE — Discharge Summary (Signed)
Physician Discharge Summary   Patient ID: [name] MRN: [id number] DOB/AGE: Jul 01, 1931 80 y.o.  Admit date: 10/16/2013 Discharge date: 10/19/2013  Primary Diagnosis:  Osteoarthritis Right knee(s)  Admission Diagnoses:  Past Medical History  Diagnosis Date  . Sleep apnea     associated with hypersomnia  . Amiodarone pulmonary toxicity   . Chronic renal insufficiency, stage III (moderate)     CrCl about 60 ml/min  . Diabetes   . Tobacco abuse   . Diastolic heart failure     Acute on Chronic  . Pacemaker     Permanent  . AF (atrial fibrillation)      AV ablation 9/09 [place] per Dr Ola Spurr - AV node ablation 9/11 Dr Caryl Comes  . Hyperlipidemia   . GERD (gastroesophageal reflux disease)   . [name] (coronary artery disease)     (not sure of this 11/10  . COPD (chronic obstructive pulmonary disease)     emphysema -FeV1 73% DLCO 53% 5/09  . Invasive ductal carcinoma of breast 2011    LEFT   . OA (osteoarthritis) of knee     RIGHT  . Right sided sciatica   . Sinoatrial node dysfunction   . CHF (congestive heart failure)   . Mental disorder   . Acute blood loss anemia 04/20/2012  . Depression 06/06/2012  . Hx of cardiovascular stress test     Lexiscan Myoview (09/2013):  No definite ischemia, EF 67%; low risk  . Complication of anesthesia     "psycotic episode" after hip surg - resolved  . PAD (peripheral artery disease)     w/hx right iliac/SFA stenting and left and rt leg PTA  . CVA (cerebral vascular accident)     no residual effects evident to pt   . History of skin cancer   . Eczema    Discharge Diagnoses:   Principal Problem:   OA (osteoarthritis) of knee  Estimated body mass index is 31.78 kg/(m^2) as calculated from the following:   Height as of this encounter: _0  (1.651 m).   Weight as of this encounter: 86.637 kg (191 lb).  Procedure:  Procedure(s) (LRB): RIGHT TOTAL KNEE ARTHROPLASTY (Right)   Consults: None  HPI: [name] is a 80 y.o.  year old female with end stage OA of her right knee with progressively worsening pain and dysfunction. She has constant pain, with activity and at rest and significant functional deficits with difficulties even with ADLs. She has had extensive non-op management including analgesics, injections of cortisone and viscosupplements, and home exercise program, but remains in significant pain with significant dysfunction.Radiographs show bone on bone arthritis lateral and patellofemoral with valgus deformity. She presents now for right Total Knee Arthroplasty.   Laboratory Data: Admission on 10/16/2013  Component Date Value Ref Range Status  . Glucose-Capillary 10/16/2013 149* 70 - 99 mg/dL Final  . Comment 1 10/16/2013 Notify RN   Final  . Glucose-Capillary 10/16/2013 106* 70 - 99 mg/dL Final  . WBC 10/17/2013 9.5  4.0 - 10.5 K/uL Final  . RBC 10/17/2013 3.94  3.87 - 5.11 MIL/uL Final  . Hemoglobin 10/17/2013 11.3* 12.0 - 15.0 g/dL Final  . HCT 10/17/2013 35.5* 36.0 - 46.0 % Final  . MCV 10/17/2013 90.1  78.0 - 100.0 fL Final  . MCH 10/17/2013 28.7  26.0 - 34.0 pg Final  . MCHC 10/17/2013 31.8  30.0 - 36.0 g/dL Final  . RDW 10/17/2013 15.6* 11.5 - 15.5 % Final  . Platelets 10/17/2013 188  150 - 400 K/uL Final  . Sodium 10/17/2013 137  137 - 147 mEq/L Final  . Potassium 10/17/2013 4.7  3.7 - 5.3 mEq/L Final  . Chloride 10/17/2013 104  96 - 112 mEq/L Final  . CO2 10/17/2013 22  19 - 32 mEq/L Final  . Glucose, Bld 10/17/2013 141* 70 - 99 mg/dL Final  . BUN 10/17/2013 17  6 - 23 mg/dL Final  . Creatinine, Ser 10/17/2013 1.14* 0.50 - 1.10 mg/dL Final  . Calcium 10/17/2013 8.9  8.4 - 10.5 mg/dL Final  . GFR calc non Af Amer 10/17/2013 44* >90 mL/min Final  . GFR calc Af Amer 10/17/2013 51* >90 mL/min Final   Comment: (NOTE)                          The eGFR has been calculated using the CKD EPI equation.                          This calculation has not been validated in all clinical situations.                           eGFR's persistently <90 mL/min signify possible Chronic Kidney                          Disease.  . Anion gap 10/17/2013 11  5 - 15 Final  . Glucose-Capillary 10/16/2013 165* 70 - 99 mg/dL Final  . Glucose-Capillary 10/16/2013 160* 70 - 99 mg/dL Final  . Glucose-Capillary 10/17/2013 130* 70 - 99 mg/dL Final  . Glucose-Capillary 10/17/2013 152* 70 - 99 mg/dL Final  . WBC 10/18/2013 11.4* 4.0 - 10.5 K/uL Final   WHITE COUNT CONFIRMED ON SMEAR  . RBC 10/18/2013 4.22  3.87 - 5.11 MIL/uL Final  . Hemoglobin 10/18/2013 12.0  12.0 - 15.0 g/dL Final  . HCT 10/18/2013 37.0  36.0 - 46.0 % Final  . MCV 10/18/2013 87.7  78.0 - 100.0 fL Final  . MCH 10/18/2013 28.4  26.0 - 34.0 pg Final  . MCHC 10/18/2013 32.4  30.0 - 36.0 g/dL Final  . RDW 10/18/2013 15.5  11.5 - 15.5 % Final  . Platelets 10/18/2013 186  150 - 400 K/uL Final   Comment: SPECIMEN CHECKED FOR CLOTS                          REPEATED TO VERIFY                          PLATELET COUNT CONFIRMED BY SMEAR  . Sodium 10/18/2013 139  137 - 147 mEq/L Final  . Potassium 10/18/2013 4.5  3.7 - 5.3 mEq/L Final  . Chloride 10/18/2013 103  96 - 112 mEq/L Final  . CO2 10/18/2013 21  19 - 32 mEq/L Final  . Glucose, Bld 10/18/2013 269* 70 - 99 mg/dL Final  . BUN 10/18/2013 18  6 - 23 mg/dL Final  . Creatinine, Ser 10/18/2013 0.85  0.50 - 1.10 mg/dL Final  . Calcium 10/18/2013 9.3  8.4 - 10.5 mg/dL Final  . GFR calc non Af Amer 10/18/2013 63* >90 mL/min Final  . GFR calc Af Amer 10/18/2013 73* >90 mL/min Final   Comment: (NOTE)  The eGFR has been calculated using the CKD EPI equation.                          This calculation has not been validated in all clinical situations.                          eGFR's persistently <90 mL/min signify possible Chronic Kidney                          Disease.  . Anion gap 10/18/2013 15  5 - 15 Final  . Glucose-Capillary 10/17/2013 221* 70 - 99 mg/dL Final  .  Glucose-Capillary 10/17/2013 216* 70 - 99 mg/dL Final  . Glucose-Capillary 10/18/2013 209* 70 - 99 mg/dL Final  . Glucose-Capillary 10/18/2013 167* 70 - 99 mg/dL Final  . WBC 10/19/2013 11.8* 4.0 - 10.5 K/uL Final  . RBC 10/19/2013 3.94  3.87 - 5.11 MIL/uL Final  . Hemoglobin 10/19/2013 11.5* 12.0 - 15.0 g/dL Final  . HCT 10/19/2013 34.3* 36.0 - 46.0 % Final  . MCV 10/19/2013 87.1  78.0 - 100.0 fL Final  . MCH 10/19/2013 29.2  26.0 - 34.0 pg Final  . MCHC 10/19/2013 33.5  30.0 - 36.0 g/dL Final  . RDW 10/19/2013 15.7* 11.5 - 15.5 % Final  . Platelets 10/19/2013 220  150 - 400 K/uL Final  . Glucose-Capillary 10/18/2013 191* 70 - 99 mg/dL Final  . Glucose-Capillary 10/18/2013 136* 70 - 99 mg/dL Final  . Comment 1 10/18/2013 Documented in Chart   Final  . Comment 2 10/18/2013 Notify RN   Final  . Glucose-Capillary 10/19/2013 114* 70 - 99 mg/dL Final  Hospital Outpatient Visit on 10/09/2013  Component Date Value Ref Range Status  . MRSA, PCR 10/09/2013 NEGATIVE  NEGATIVE Final  . Staphylococcus aureus 10/09/2013 NEGATIVE  NEGATIVE Final   Comment:                                 The Xpert SA Assay (FDA                          approved for NASAL specimens                          in patients over 44 years of age),                          is one component of                          a comprehensive surveillance                          program.  Test performance has                          been validated by [employer] for patients greater  than or equal to 47 year old.                          It is not intended                          to diagnose infection nor to                          guide or monitor treatment.  Marland Kitchen aPTT 10/09/2013 37  24 - 37 seconds Final   Comment:                                 IF BASELINE aPTT IS ELEVATED,                          SUGGEST PATIENT RISK ASSESSMENT                          BE USED TO  DETERMINE APPROPRIATE                          ANTICOAGULANT THERAPY.  . WBC 10/09/2013 9.4  4.0 - 10.5 K/uL Final  . RBC 10/09/2013 4.58  3.87 - 5.11 MIL/uL Final  . Hemoglobin 10/09/2013 13.1  12.0 - 15.0 g/dL Final  . HCT 10/09/2013 41.5  36.0 - 46.0 % Final  . MCV 10/09/2013 90.6  78.0 - 100.0 fL Final  . MCH 10/09/2013 28.6  26.0 - 34.0 pg Final  . MCHC 10/09/2013 31.6  30.0 - 36.0 g/dL Final  . RDW 10/09/2013 15.6* 11.5 - 15.5 % Final  . Platelets 10/09/2013 234  150 - 400 K/uL Final  . Sodium 10/09/2013 141  137 - 147 mEq/L Final  . Potassium 10/09/2013 4.9  3.7 - 5.3 mEq/L Final  . Chloride 10/09/2013 106  96 - 112 mEq/L Final  . CO2 10/09/2013 24  19 - 32 mEq/L Final  . Glucose, Bld 10/09/2013 132* 70 - 99 mg/dL Final  . BUN 10/09/2013 16  6 - 23 mg/dL Final  . Creatinine, Ser 10/09/2013 0.97  0.50 - 1.10 mg/dL Final  . Calcium 10/09/2013 9.8  8.4 - 10.5 mg/dL Final  . Total Protein 10/09/2013 7.5  6.0 - 8.3 g/dL Final  . Albumin 10/09/2013 3.4* 3.5 - 5.2 g/dL Final  . AST 10/09/2013 23  0 - 37 U/L Final  . ALT 10/09/2013 12  0 - 35 U/L Final  . Alkaline Phosphatase 10/09/2013 81  39 - 117 U/L Final  . Total Bilirubin 10/09/2013 0.3  0.3 - 1.2 mg/dL Final  . GFR calc non Af Amer 10/09/2013 54* >90 mL/min Final  . GFR calc Af Amer 10/09/2013 62* >90 mL/min Final   Comment: (NOTE)                          The eGFR has been calculated using the CKD EPI equation.                          This calculation has not been validated in all clinical situations.  eGFR's persistently <90 mL/min signify possible Chronic Kidney                          Disease.  . Anion gap 10/09/2013 11  5 - 15 Final  . Prothrombin Time 10/09/2013 13.9  11.6 - 15.2 seconds Final  . INR 10/09/2013 1.07  0.00 - 1.49 Final  . ABO/RH(D) 10/09/2013 A POS   Final  . Antibody Screen 10/09/2013 NEG   Final  . Sample Expiration 10/09/2013 10/19/2013   Final  . Color, Urine 10/09/2013  YELLOW  YELLOW Final  . APPearance 10/09/2013 CLOUDY* CLEAR Final  . Specific Gravity, Urine 10/09/2013 1.019  1.005 - 1.030 Final  . pH 10/09/2013 7.0  5.0 - 8.0 Final  . Glucose, UA 10/09/2013 NEGATIVE  NEGATIVE mg/dL Final  . Hgb urine dipstick 10/09/2013 TRACE* NEGATIVE Final  . Bilirubin Urine 10/09/2013 NEGATIVE  NEGATIVE Final  . Ketones, ur 10/09/2013 NEGATIVE  NEGATIVE mg/dL Final  . Protein, ur 10/09/2013 NEGATIVE  NEGATIVE mg/dL Final  . Urobilinogen, UA 10/09/2013 0.2  0.0 - 1.0 mg/dL Final  . Nitrite 10/09/2013 NEGATIVE  NEGATIVE Final  . Leukocytes, UA 10/09/2013 LARGE* NEGATIVE Final  . ABO/RH(D) 10/09/2013 A POS   Final  . Squamous Epithelial / LPF 10/09/2013 RARE  RARE Final  . WBC, UA 10/09/2013 11-20  <3 WBC/hpf Final  . RBC / HPF 10/09/2013 0-2  <3 RBC/hpf Final  . Bacteria, UA 10/09/2013 FEW* RARE Final  . Edwina Barth 10/09/2013 WBCS IN CLUMPS PRESENT   Final  Office Visit on 09/12/2013  Component Date Value Ref Range Status  . Date Time Interrogation Session 09/12/2013 [id number]   Final  . Pulse Generator Manufacturer 09/12/2013 [employer]   Final  . Pulse Gen Model 09/12/2013 5386 Identity ADx XL DR   Final  . Pulse Gen Serial Number 09/12/2013 [id number]   Final  . RV Sense Sensitivity 09/12/2013 4.0   Final  . RA Pace Amplitude 09/12/2013 2.00   Final  . RV Pace PulseWidth 09/12/2013 0.5   Final  . RA Impedance 09/12/2013 484   Final  . RA Pacing Amplitude 09/12/2013 0.50   Final  . RA Pacing PulseWidth 09/12/2013 0.4   Final  . RV IMPEDANCE 09/12/2013 518   Final  . Battery Status 09/12/2013 Unknown   Final  . Battery Longevity 09/12/2013 1.25 - 1.50 years   Final  . Battery Voltage 09/12/2013 2.74   Final  . Battery Impedance 09/12/2013 5700.0   Final  . Loletha Grayer RA Perc Paced 09/12/2013 91   Final  . Loletha Grayer RV Perc Paced 09/12/2013 99*  Final  . Eval Rhythm 09/12/2013 Vp _0    Final  . Miscellaneous Comment 09/12/2013    Final                    Value:Pacemaker check in clinic. Normal device function. Thresholds, sensing, impedances consistent with previous measurements. Device programmed to maximize longevity. 32% mode switch + eliquis. Device programmed at appropriate safety margins---increased RA                          sensitivity from 0.5 to 0.65m. Histogram distribution appropriate for patient activity level. Device programmed to optimize intrinsic conduction. Estimated longevity 1.25-1.541yr ROV w/ device clinic in 34m78mow/ SK in 78m22moppointment on 09/05/2013  Component Date Value Ref Range Status  . TSH 09/05/2013 2.02  0.35 - 4.50 uIU/mL Final     X-Rays:Dg Chest 2 View  10/17/2013   CLINICAL DATA:  Postop hypoxia.  EXAM: CHEST  2 VIEW  COMPARISON:  Prior radiograph from 10/09/2013  FINDINGS: Dual lead transvenous left-sided pacemaker/ AICD with electrodes overlying the right atrium and right ventricle is stable. Allowing for AP technique, transverse heart size is likely unchanged. Tortuosity of the intrathoracic aorta noted. Mediastinal silhouette within normal limits.  Lungs are normally inflated. There is subtly increased pulmonary vascular congestion as compared to prior study, suggesting early or developing pulmonary edema. Minimal left basilar atelectasis present. Small bilateral pleural effusions are present. No focal infiltrate. No pneumothorax.  No acute osseous abnormality  IMPRESSION: 1. Increased pulmonary vascular congestion as compared to prior study, suggesting early/developing pulmonary edema. 2. Small bilateral pleural effusions. 3. Mild left basilar atelectasis.   Electronically Signed   By: Jeannine Boga M.D.   On: 10/17/2013 18:21   Dg Chest 2 View  10/09/2013   CLINICAL DATA:  Pre-op for total knee replacement. History of diabetes and smoking.  EXAM: CHEST  2 VIEW  COMPARISON:  04/24/2012 and 12/16/2011.  FINDINGS: Left subclavian pacemaker leads appear unchanged within the right atrium and right ventricle.  The heart size and mediastinal contours are stable. The accentuated interstitial markings noted on the most recent examination have improved. There is no confluent airspace opacity or pleural effusion. Posttraumatic deformity of the left humeral neck is noted.  IMPRESSION: No active cardiopulmonary process.   Electronically Signed   By: Camie Patience M.D.   On: 10/09/2013 13:34    EKG: Orders placed in visit on 09/12/13  . EKG 12-LEAD     Hospital Course: [name] is a 80 y.o. who was admitted to [facility]. They were brought to the operating room on 10/16/2013 and underwent Procedure(s): RIGHT TOTAL KNEE ARTHROPLASTY.  Patient tolerated the procedure well and was later transferred to the recovery room and then to the orthopaedic floor for postoperative care.  They were given PO and IV analgesics for pain control following their surgery.  They were given 24 hours of postoperative antibiotics of  Anti-infectives   Start     Dose/Rate Route Frequency Ordered Stop   10/16/13 1430  ceFAZolin (ANCEF) IVPB 2 g/50 mL premix     2 g 100 mL/hr over 30 Minutes Intravenous Every 6 hours 10/16/13 1209 10/16/13 2050   10/16/13 0605  ceFAZolin (ANCEF) IVPB 2 g/50 mL premix     2 g 100 mL/hr over 30 Minutes Intravenous On call to O.R. 10/16/13 2409 10/16/13 0815     and started on DVT prophylaxis in the form of Xarelto. She took Eliquis at home but started on Xarelto in order to satisfy SCIP protocols.  PT and OT were ordered for total joint protocol.  Discharge planning consulted to help with postop disposition and equipment needs. Social worker got involved to assist with placement of the patient.  Patient had a decent night on the evening of surgery.  They started to get up OOB with therapy on day one. Hemovac drain was pulled without difficulty.  Continued to work with therapy into day two.  Dressing was changed on day two and the incision was healing well.  By day three, the patient had  progressed with therapy and meeting their goals.  Incision was healing well.  Patient was seen in rounds and was ready to go to [facility] for [facility] therapy  Discharge to SNF -  [facility]  Diet - Cardiac diet and Diabetic diet  Follow up - in 1 week on Thursday 10/26/2013  Activity - WBAT  Disposition - Skilled nursing facility  Condition Upon Discharge - Good  D/C Meds - See DC Summary  DVT Prophylaxis - Xarelto while in hospital, will switch back to Eliquis following discharge   Discharge Instructions   Call MD / Call 911    Complete by:  As directed   If you experience chest pain or shortness of breath, CALL 911 and be transported to the hospital emergency room.  If you develope a fever above 101 F, pus (white drainage) or increased drainage or redness at the wound, or calf pain, call your surgeon's office.     Change dressing    Complete by:  As directed   Change dressing daily with sterile 4 x 4 inch gauze dressing and apply TED hose. Do not submerge the incision under water.     Constipation Prevention    Complete by:  As directed   Drink plenty of fluids.  Prune juice may be helpful.  You may use a stool softener, such as Colace (over the counter) 100 mg twice a day.  Use MiraLax (over the counter) for constipation as needed.     Diet - low sodium heart healthy    Complete by:  As directed      Diet Carb Modified    Complete by:  As directed      Discharge instructions    Complete by:  As directed   Pick up stool softner and laxative for home. Do not submerge incision under water. May shower. Continue to use ice for pain and swelling from surgery. Resume the Eliquis at transfer to [facility]  Follow up with Dr. Wynelle Link on Thursday August 20th.  Call office at [phone] to set up appointment time for the patient to be seen on Thursday.  When discharged from the skilled rehab facility, please have the facility set up the patient's [place] prior to  being released.  Also provide the patient with their medications at time of release from the facility to include their pain medication, the muscle relaxants, and their blood thinner medication.  If the patient is still at the rehab facility at time of follow up appointment, please also assist the patient in arranging follow up appointment in our office and any transportation needs.     Do not put a pillow under the knee. Place it under the heel.    Complete by:  As directed      Do not sit on low chairs, stoools or toilet seats, as it may be difficult to get up from low surfaces    Complete by:  As directed      Driving restrictions    Complete by:  As directed   No driving until released by the physician.     Increase activity slowly as tolerated    Complete by:  As directed      Lifting restrictions    Complete by:  As directed   No lifting until released by the physician.     Patient may shower    Complete by:  As directed   You may shower without a dressing once there is no drainage.  Do not wash over the wound.  If drainage remains, do not shower until drainage stops.     TED hose    Complete by:  As directed  Use stockings (TED hose) for 3 weeks on both leg(s).  You may remove them at night for sleeping.     Weight bearing as tolerated    Complete by:  As directed             Medication List    STOP taking these medications       acidophilus Caps capsule      TAKE these medications       acetaminophen 325 MG tablet  Commonly known as:  TYLENOL  Take 2 tablets (650 mg total) by mouth every 6 (six) hours as needed for mild pain (or Fever >/= 101).     apixaban 5 MG Tabs tablet  Commonly known as:  ELIQUIS  Take 1 tablet (5 mg total) by mouth 2 (two) times daily.     atenolol 25 MG tablet  Commonly known as:  TENORMIN  Take 25 mg by mouth every morning.     atorvastatin 20 MG tablet  Commonly known as:  LIPITOR  Take 20 mg by mouth every evening.     bisacodyl  10 MG suppository  Commonly known as:  DULCOLAX  Place 1 suppository (10 mg total) rectally daily as needed for moderate constipation.     CALCIUM + D PO  Take 1 tablet by mouth 3 (three) times a week.     cholecalciferol 1000 UNITS tablet  Commonly known as:  VITAMIN D  Take 1,000 Units by mouth daily.     DSS 100 MG Caps  Take 100 mg by mouth 2 (two) times daily.     Fluticasone-Salmeterol 250-50 MCG/DOSE Aepb  Commonly known as:  ADVAIR  Inhale 1 puff into the lungs 2 (two) times daily.     HYDROmorphone 2 MG tablet  Commonly known as:  DILAUDID  Take 0.5-1 tablets (1-2 mg total) by mouth every 4 (four) hours as needed for moderate pain or severe pain.     letrozole 2.5 MG tablet  Commonly known as:  FEMARA  Take 1 tablet (2.5 mg total) by mouth daily.     levalbuterol 45 MCG/ACT inhaler  Commonly known as:  XOPENEX HFA  Inhale 2 puffs into the lungs every 6 (six) hours as needed. For shortness of breath     magnesium oxide 400 MG tablet  Commonly known as:  MAG-OX  Take 400 mg by mouth daily.     metFORMIN 500 MG tablet  Commonly known as:  GLUCOPHAGE  Take 1 tablet (500 mg total) by mouth 2 (two) times daily with a meal.     methocarbamol 500 MG tablet  Commonly known as:  ROBAXIN  Take 1 tablet (500 mg total) by mouth every 6 (six) hours as needed for muscle spasms.     metoCLOPramide 5 MG tablet  Commonly known as:  REGLAN  Take 1-2 tablets (5-10 mg total) by mouth every 8 (eight) hours as needed for nausea (if ondansetron (ZOFRAN) ineffective.).     mometasone 50 MCG/ACT nasal spray  Commonly known as:  NASONEX  Place 2 sprays into the nose daily as needed (allergies).     ondansetron 4 MG tablet  Commonly known as:  ZOFRAN  Take 1 tablet (4 mg total) by mouth every 6 (six) hours as needed for nausea.     OVER THE COUNTER MEDICATION  Take 1 tablet by mouth daily. procera     OVER THE COUNTER MEDICATION  Take 1 tablet by mouth daily. ceraplex  polyethylene glycol packet  Commonly known as:  MIRALAX / GLYCOLAX  Take 17 g by mouth daily as needed for mild constipation.     potassium chloride SA 20 MEQ tablet  Commonly known as:  K-DUR,KLOR-CON  Take 40-60 mEq by mouth 2 (two) times daily. Take 3 tablets in morning and Take 2 tablets every evening     tiotropium 18 MCG inhalation capsule  Commonly known as:  SPIRIVA HANDIHALER  Place 1 capsule (18 mcg total) into inhaler and inhale daily.     torsemide 10 MG tablet  Commonly known as:  DEMADEX  Take 10 mg by mouth every morning.     traMADol 50 MG tablet  Commonly known as:  ULTRAM  Take 1-2 tablets (50-100 mg total) by mouth every 6 (six) hours as needed for moderate pain.     VITAMIN B-12 CR PO  Take 1 tablet by mouth daily.     VITAMIN E PO  Take 1 capsule by mouth every Monday, Wednesday, and Friday.           Follow-up Information   Follow up with Gearlean Alf, MD. Schedule an appointment as soon as possible for a visit on 10/26/2013. (Call the office at [phone] to set up an appointment time for the patient of Thursday the 20th.)    Specialty:  Orthopedic Surgery   Contact information:   [address] [place] [address] [phone]       Signed: [name], PA-C Orthopaedic Surgery 10/19/2013, 7:38 AM

## 2013-10-19 NOTE — Discharge Instructions (Signed)
Dr. Gaynelle Arabian Total Joint Specialist [facility] [address] [place], [place] [address] [phone]  TOTAL KNEE REPLACEMENT POSTOPERATIVE DIRECTIONS    Knee Rehabilitation, Guidelines Following Surgery  Results after knee surgery are often greatly improved when you follow the exercise, range of motion and muscle strengthening exercises prescribed by your doctor. Safety measures are also important to protect the knee from further injury. Any time any of these exercises cause you to have increased pain or swelling in your knee joint, decrease the amount until you are comfortable again and slowly increase them. If you have problems or questions, call your caregiver or physical therapist for advice.   HOME CARE INSTRUCTIONS  Remove items at home which could result in a fall. This includes throw rugs or furniture in walking pathways.  Continue medications as instructed at time of discharge. You may have some home medications which will be placed on hold until you complete the course of blood thinner medication.  You may start showering once you are discharged home but do not submerge the incision under water. Just pat the incision dry and apply a dry gauze dressing on daily. Walk with walker as instructed.  You may resume a sexual relationship in one month or when given the OK by  your doctor.   Use walker as long as suggested by your caregivers.  Avoid periods of inactivity such as sitting longer than an hour when not asleep. This helps prevent blood clots.  You may put full weight on your legs and walk as much as is comfortable.  You may return to work once you are cleared by your doctor.  Do not drive a car for 6 weeks or until released by you surgeon.   Do not drive while taking narcotics.  Wear the elastic stockings for three weeks following surgery during the day but you may remove then at night. Make sure you keep all of your appointments after your  operation with all of your doctors and caregivers. You should call the office at the above phone number and make an appointment for approximately two weeks after the date of your surgery. Change the dressing daily and reapply a dry dressing each time. Please pick up a stool softener and laxative for home use as long as you are requiring pain medications.  Continue to use ice on the knee for pain and swelling from surgery. You may notice swelling that will progress down to the foot and ankle.  This is normal after surgery.  Elevate the leg when you are not up walking on it.   It is important for you to complete the blood thinner medication as prescribed by your doctor.  Continue to use the breathing machine which will help keep your temperature down.  It is common for your temperature to cycle up and down following surgery, especially at night when you are not up moving around and exerting yourself.  The breathing machine keeps your lungs expanded and your temperature down.  RANGE OF MOTION AND STRENGTHENING EXERCISES  Rehabilitation of the knee is important following a knee injury or an operation. After just a few days of immobilization, the muscles of the thigh which control the knee become weakened and shrink (atrophy). Knee exercises are designed to build up the tone and strength of the thigh muscles and to improve knee motion. Often times heat used for twenty to thirty minutes before working out will loosen up your tissues and help with improving the  range of motion but do not use heat for the first two weeks following surgery. These exercises can be done on a training (exercise) mat, on the floor, on a table or on a bed. Use what ever works the best and is most comfortable for you Knee exercises include:  Leg Lifts - While your knee is still immobilized in a splint or cast, you can do straight leg raises. Lift the leg to 60 degrees, hold for 3 sec, and slowly lower the leg. Repeat 10-20 times 2-3  times daily. Perform this exercise against resistance later as your knee gets better.  Quad and Hamstring Sets - Tighten up the muscle on the front of the thigh (Quad) and hold for 5-10 sec. Repeat this 10-20 times hourly. Hamstring sets are done by pushing the foot backward against an object and holding for 5-10 sec. Repeat as with quad sets.  A rehabilitation program following serious knee injuries can speed recovery and prevent re-injury in the future due to weakened muscles. Contact your doctor or a physical therapist for more information on knee rehabilitation.   SKILLED REHAB INSTRUCTIONS: If the patient is transferred to a skilled rehab facility following release from the hospital, a list of the current medications will be sent to the facility for the patient to continue.  When discharged from the skilled rehab facility, please have the facility set up the patient's [place] prior to being released. Also, the skilled facility will be responsible for providing the patient with their medications at time of release from the facility to include their pain medication, the muscle relaxants, and their blood thinner medication. If the patient is still at the rehab facility at time of the two week follow up appointment, the skilled rehab facility will also need to assist the patient in arranging follow up appointment in our office and any transportation needs.  MAKE SURE YOU:  Understand these instructions.  Will watch your condition.  Will get help right away if you are not doing well or get worse.    Pick up stool softner and laxative for home. Do not submerge incision under water. May shower. Continue to use ice for pain and swelling from surgery.  When discharged from the skilled rehab facility, please have the facility set up the patient's [place] prior to being released.  Also provide the patient with their medications at time of release from the  facility to include their pain medication, the muscle relaxants, and their blood thinner medication.  If the patient is still at the rehab facility at time of follow up appointment, please also assist the patient in arranging follow up appointment in our office and any transportation needs.

## 2013-10-19 NOTE — Progress Notes (Signed)
Pt to d/c to [facility].

## 2013-10-19 NOTE — Clinical Social Work Note (Signed)
Clinical Social Work Department CLINICAL SOCIAL WORK PLACEMENT NOTE 10/19/2013  Patient:  [name], [name]  Account Number:  [id number] Admit date:  10/16/2013  Clinical Social Worker:  Werner Lean, LCSW  Date/time:  10/16/2013 03:28 PM  Clinical Social Work is seeking post-discharge placement for this patient at the following level of care:   SKILLED NURSING   (*CSW will update this form in Epic as items are completed)     Patient/family provided with [facility] Department of Clinical Social Work's list of facilities offering this level of care within the geographic area requested by the patient (or if unable, by the patient's family).  10/16/2013  Patient/family informed of their freedom to choose among providers that offer the needed level of care, that participate in Medicare, Medicaid or managed care program needed by the patient, have an available bed and are willing to accept the patient.    Patient/family informed of [facility]' ownership interest in [facility], as well as of the fact that they are under no obligation to receive care at this facility.  PASARR submitted to EDS on 10/16/2013 PASARR number received on 10/16/2013  FL2 transmitted to all facilities in geographic area requested by pt/family on  10/16/2013 FL2 transmitted to all facilities within larger geographic area on   Patient informed that his/her managed care company has contracts with or will negotiate with  certain facilities, including the following:     Patient/family informed of bed offers received:  10/16/2013 Patient chooses bed at [facility] Physician recommends and patient chooses bed at    Patient to be transferred to [facility] on  10/19/2013 Patient to be transferred to facility by EMS Patient and family notified of transfer on 10/19/2013 Name of family member notified:  PATIENT, SONInocente Salles  The following physician request were entered in Epic:   Additional  Comments:  Eduard Clos, MSW, [place]

## 2013-10-20 ENCOUNTER — Non-Acute Institutional Stay (SKILLED_NURSING_FACILITY): Payer: Medicare Other | Admitting: Adult Health

## 2013-10-20 ENCOUNTER — Encounter: Payer: Self-pay | Admitting: Adult Health

## 2013-10-20 ENCOUNTER — Other Ambulatory Visit: Payer: Self-pay | Admitting: *Deleted

## 2013-10-20 DIAGNOSIS — I5032 Chronic diastolic (congestive) heart failure: Secondary | ICD-10-CM | POA: Diagnosis not present

## 2013-10-20 DIAGNOSIS — I482 Chronic atrial fibrillation, unspecified: Secondary | ICD-10-CM

## 2013-10-20 DIAGNOSIS — M1710 Unilateral primary osteoarthritis, unspecified knee: Secondary | ICD-10-CM | POA: Diagnosis not present

## 2013-10-20 DIAGNOSIS — I4891 Unspecified atrial fibrillation: Secondary | ICD-10-CM

## 2013-10-20 DIAGNOSIS — J449 Chronic obstructive pulmonary disease, unspecified: Secondary | ICD-10-CM

## 2013-10-20 DIAGNOSIS — M1711 Unilateral primary osteoarthritis, right knee: Secondary | ICD-10-CM

## 2013-10-20 DIAGNOSIS — E1159 Type 2 diabetes mellitus with other circulatory complications: Secondary | ICD-10-CM | POA: Diagnosis not present

## 2013-10-20 DIAGNOSIS — I509 Heart failure, unspecified: Secondary | ICD-10-CM

## 2013-10-20 DIAGNOSIS — E785 Hyperlipidemia, unspecified: Secondary | ICD-10-CM

## 2013-10-20 DIAGNOSIS — C50919 Malignant neoplasm of unspecified site of unspecified female breast: Secondary | ICD-10-CM

## 2013-10-20 DIAGNOSIS — J4489 Other specified chronic obstructive pulmonary disease: Secondary | ICD-10-CM

## 2013-10-20 DIAGNOSIS — E559 Vitamin D deficiency, unspecified: Secondary | ICD-10-CM

## 2013-10-20 MED ORDER — HYDROMORPHONE HCL 2 MG PO TABS: ORAL_TABLET | ORAL | Status: DC

## 2013-10-20 NOTE — Telephone Encounter (Signed)
[facility] 

## 2013-10-20 NOTE — Progress Notes (Signed)
Patient ID: [name], female   DOB: 07/05/1931, 80 y.o.   MRN: [id number]               PROGRESS NOTE  DATE: 10/20/2013  FACILITY: Nursing Home Location: [facility]  LEVEL OF CARE: SNF (31)  Acute Visit  CHIEF COMPLAINT:  Follow-up Hospitalization  HISTORY OF PRESENT ILLNESS: This is an 80 year old female who has been admitted to [facility] on 10/19/13 from [facility] with Osteoarthritis S/P right total knee arthroplasty. She has been admitted for a short-term rehabilitation.  REASSESSMENT OF ONGOING PROBLEM(S):  ATRIAL FIBRILLATION: the patients atrial fibrillation remains stable.  The patient denies DOE, tachycardia, orthopnea, transient neurological sx, palpitations & PNDs.  No complications noted from the medications currently being used.  CHF:The patient does not relate significant weight changes, denies sob, DOE, orthopnea, PNDs, pedal edema, palpitations or chest pain.  CHF remains stable.  No complications form the medications being used.  DM:pt's DM remains stable.  Pt denies polyuria, polydipsia, polyphagia, changes in vision or hypoglycemic episodes.  No complications noted from the medication presently being used.   5/15 hemoglobin A1c is: 6.8  PAST MEDICAL HISTORY : Reviewed.  No changes/see problem list  CURRENT MEDICATIONS: Reviewed per MAR/see medication list  REVIEW OF SYSTEMS:  GENERAL: no change in appetite, no fatigue, no weight changes, no fever, chills or weakness RESPIRATORY: no cough, SOB, DOE, wheezing, hemoptysis CARDIAC: no chest pain, edema or palpitations GI: no abdominal pain, diarrhea, constipation, heart burn, nausea or vomiting  PHYSICAL EXAMINATION  GENERAL: no acute distress, normal body habitus SKIN; right knee surgical wound is dry, no redness EYES: conjunctivae normal, sclerae normal, normal eye lids NECK: supple, trachea midline, no neck masses, no thyroid tenderness, no thyromegaly LYMPHATICS: no  LAN in the neck, no supraclavicular LAN RESPIRATORY: breathing is even & unlabored, BS CTAB CARDIAC: RRR, no murmur,no extra heart sounds, no edema GI: abdomen soft, normal BS, no masses, no tenderness, no hepatomegaly, no splenomegaly EXTREMITIES: able to move all 4 extremities with limited ROM on RLE due to surgery PSYCHIATRIC: the patient is alert & oriented to person, affect & behavior appropriate  LABS/RADIOLOGY: Labs reviewed: Basic Metabolic Panel:  Recent Labs  10/09/13 1130 10/17/13 0450 10/18/13 0515  NA 141 137 139  K 4.9 4.7 4.5  CL 106 104 103  CO2 24 22 21   GLUCOSE 132* 141* 269*  BUN 16 17 18   CREATININE 0.97 1.14* 0.85  CALCIUM 9.8 8.9 9.3   Liver Function Tests:  Recent Labs  05/24/13 0924 10/09/13 1130  AST 15 23  ALT 12 12  ALKPHOS 78 81  BILITOT 0.40 0.3  PROT 7.1 7.5  ALBUMIN 3.2* 3.4*   CBC:  Recent Labs  05/24/13 0924  10/17/13 0450 10/18/13 0515 10/19/13 0432  WBC 11.3*  < > 9.5 11.4* 11.8*  NEUTROABS 7.3*  --   --   --   --   HGB 12.1  < > 11.3* 12.0 11.5*  HCT 37.1  < > 35.5* 37.0 34.3*  MCV 88.9  < > 90.1 87.7 87.1  PLT 243  < > 188 186 220  < > = values in this interval not displayed.  CBG:  Recent Labs  10/18/13 2053 10/19/13 0727 10/19/13 1118  GLUCAP 136* 114* 149*    EXAM: CHEST  2 VIEW   COMPARISON:  Prior radiograph from 10/09/2013   FINDINGS: Dual lead transvenous left-sided pacemaker/ AICD with electrodes overlying the right  atrium and right ventricle is stable. Allowing for AP technique, transverse heart size is likely unchanged. Tortuosity of the intrathoracic aorta noted. Mediastinal silhouette within normal limits.   Lungs are normally inflated. There is subtly increased pulmonary vascular congestion as compared to prior study, suggesting early or developing pulmonary edema. Minimal left basilar atelectasis present. Small bilateral pleural effusions are present. No focal infiltrate. No  pneumothorax.   No acute osseous abnormality   IMPRESSION: 1. Increased pulmonary vascular congestion as compared to prior study, suggesting early/developing pulmonary edema. 2. Small bilateral pleural effusions. 3. Mild left basilar atelectasis.    ASSESSMENT/PLAN:  Osteoarthritis status post right total knee arthroplasty - for rehabilitation Diabetes mellitus, type II - well controlled; continue Glucophage COPD - continue Spiriva and Advair Diastolic heart failure - stable; continue Demadex Hyperlipidemia - continue Lipitor History of left breast cancer - continue Femara Chronic atrial fibrillation - continue Elequis and atenolol Vitamin D deficiency - continue supplementation   CPT CODE: 50569  Kamill Fulbright Vargas - NP [facility] [phone]

## 2013-10-23 ENCOUNTER — Encounter: Payer: Self-pay | Admitting: Adult Health

## 2013-10-23 ENCOUNTER — Non-Acute Institutional Stay (SKILLED_NURSING_FACILITY): Payer: Medicare Other | Admitting: Adult Health

## 2013-10-23 DIAGNOSIS — B370 Candidal stomatitis: Secondary | ICD-10-CM | POA: Diagnosis not present

## 2013-10-24 ENCOUNTER — Non-Acute Institutional Stay (SKILLED_NURSING_FACILITY): Payer: Medicare Other | Admitting: Internal Medicine

## 2013-10-24 DIAGNOSIS — D62 Acute posthemorrhagic anemia: Secondary | ICD-10-CM | POA: Diagnosis not present

## 2013-10-24 DIAGNOSIS — M1710 Unilateral primary osteoarthritis, unspecified knee: Secondary | ICD-10-CM | POA: Diagnosis not present

## 2013-10-24 DIAGNOSIS — J449 Chronic obstructive pulmonary disease, unspecified: Secondary | ICD-10-CM | POA: Diagnosis not present

## 2013-10-24 DIAGNOSIS — M1711 Unilateral primary osteoarthritis, right knee: Secondary | ICD-10-CM

## 2013-10-24 DIAGNOSIS — I4891 Unspecified atrial fibrillation: Secondary | ICD-10-CM

## 2013-10-24 DIAGNOSIS — I482 Chronic atrial fibrillation, unspecified: Secondary | ICD-10-CM

## 2013-10-24 NOTE — Progress Notes (Signed)
Patient ID: [name], female   DOB: [birth year], 80 y.o.   MRN: [id number]              PROGRESS NOTE  DATE:  10/23/13  FACILITY: Nursing Home Location: [facility]  LEVEL OF CARE: SNF (31)  Acute Visit  CHIEF COMPLAINT:  Manage Oral Candida  HISTORY OF PRESENT ILLNESS: This is an 80 year old female who complains of oral pain especially when eating. Noted to have whitish cheesy coating on her tongue.    PAST MEDICAL HISTORY : Reviewed.  No changes/see problem list  CURRENT MEDICATIONS: Reviewed per MAR/see medication list  REVIEW OF SYSTEMS:  GENERAL: no change in appetite, no fatigue, no weight changes, no fever, chills or weakness RESPIRATORY: no cough, SOB, DOE, wheezing, hemoptysis CARDIAC: no chest pain, edema or palpitations GI: no abdominal pain, diarrhea, constipation, heart burn, nausea or vomiting  PHYSICAL EXAMINATION  GENERAL: no acute distress, normal body habitus EYES: conjunctivae normal, sclerae normal, normal eye lids NECK: supple, trachea midline, no neck masses, no thyroid tenderness, no thyromegaly LYMPHATICS: no LAN in the neck, no supraclavicular LAN RESPIRATORY: breathing is even & unlabored, BS CTAB CARDIAC: RRR, no murmur,no extra heart sounds, no edema GI: abdomen soft, normal BS, no masses, no tenderness, no hepatomegaly, no splenomegaly EXTREMITIES: able to move all 4 extremities with limited ROM on RLE due to surgery PSYCHIATRIC: the patient is alert & oriented to person, affect & behavior appropriate  LABS/RADIOLOGY: Labs reviewed: Basic Metabolic Panel:  Recent Labs  10/09/13 1130 10/17/13 0450 10/18/13 0515  NA 141 137 139  K 4.9 4.7 4.5  CL 106 104 103  CO2 24 22 21   GLUCOSE 132* 141* 269*  BUN 16 17 18   CREATININE 0.97 1.14* 0.85  CALCIUM 9.8 8.9 9.3   Liver Function Tests:  Recent Labs  05/24/13 0924 10/09/13 1130  AST 15 23  ALT 12 12  ALKPHOS 78 81  BILITOT 0.40 0.3  PROT 7.1 7.5  ALBUMIN  3.2* 3.4*   CBC:  Recent Labs  05/24/13 0924  10/17/13 0450 10/18/13 0515 10/19/13 0432  WBC 11.3*  < > 9.5 11.4* 11.8*  NEUTROABS 7.3*  --   --   --   --   HGB 12.1  < > 11.3* 12.0 11.5*  HCT 37.1  < > 35.5* 37.0 34.3*  MCV 88.9  < > 90.1 87.7 87.1  PLT 243  < > 188 186 220  < > = values in this interval not displayed.  CBG:  Recent Labs  10/18/13 2053 10/19/13 0727 10/19/13 1118  GLUCAP 136* 114* 149*    EXAM: CHEST  2 VIEW   COMPARISON:  Prior radiograph from 10/09/2013   FINDINGS: Dual lead transvenous left-sided pacemaker/ AICD with electrodes overlying the right atrium and right ventricle is stable. Allowing for AP technique, transverse heart size is likely unchanged. Tortuosity of the intrathoracic aorta noted. Mediastinal silhouette within normal limits.   Lungs are normally inflated. There is subtly increased pulmonary vascular congestion as compared to prior study, suggesting early or developing pulmonary edema. Minimal left basilar atelectasis present. Small bilateral pleural effusions are present. No focal infiltrate. No pneumothorax.   No acute osseous abnormality   IMPRESSION: 1. Increased pulmonary vascular congestion as compared to prior study, suggesting early/developing pulmonary edema. 2. Small bilateral pleural effusions. 3. Mild left basilar atelectasis.    ASSESSMENT/PLAN:  Oral Candida - start Nystatin 100, 000 units/ml give 5 ml = 500,000 units swish  and swallow QID X 2 weeks  CPT CODE: 36122  Seth Bake - NP [facility] [phone]

## 2013-10-26 NOTE — Progress Notes (Signed)
HISTORY & PHYSICAL  DATE: 10/24/2013   FACILITY: [facility]  LEVEL OF CARE: SNF (31)  ALLERGIES:  Allergies  Allergen Reactions  . Cholestatin     RAGWEED SEASON  . Sulfur Itching    CHIEF COMPLAINT:  Manage right knee osteoarthritis, COPD and atrial fibrillation  HISTORY OF PRESENT ILLNESS: Patient is an 80 year old Caucasian female.  KNEE OSTEOARTHRITIS: Patient had a history of pain and functional disability in the knee due to end-stage osteoarthritis and has failed nonsurgical conservative treatments. Patient had worsening of pain with activity and weight bearing, pain that interfered with activities of daily living & pain with passive range of motion. Therefore patient underwent total knee arthroplasty and tolerated the procedure well. Patient is admitted to this facility for sort short-term rehabilitation. Patient denies knee pain.  ATRIAL FIBRILLATION: the patients atrial fibrillation remains stable.  The patient denies DOE, tachycardia, orthopnea, transient neurological sx, pedal edema, palpitations, & PNDs.  No complications noted from the medications currently being used.  COPD: the COPD remains stable.  Pt denies sob, cough, wheezing or declining exercise tolerance.  No complications from the medications presently being used.  PAST MEDICAL HISTORY :  Past Medical History  Diagnosis Date  . Sleep apnea     associated with hypersomnia  . Amiodarone pulmonary toxicity   . Chronic renal insufficiency, stage III (moderate)     CrCl about 60 ml/min  . Diabetes   . Tobacco abuse   . Diastolic heart failure     Acute on Chronic  . Pacemaker     Permanent  . AF (atrial fibrillation)      AV ablation 9/09 [place] [facility] per Dr Ola Spurr - AV node ablation 9/11 Dr Caryl Comes  . Hyperlipidemia   . GERD (gastroesophageal reflux disease)   . [name] (coronary artery disease)     (not sure of this 11/10  . COPD (chronic obstructive pulmonary disease)    emphysema -FeV1 73% DLCO 53% 5/09  . Invasive ductal carcinoma of breast 2011    LEFT   . OA (osteoarthritis) of knee     RIGHT  . Right sided sciatica   . Sinoatrial node dysfunction   . CHF (congestive heart failure)   . Mental disorder   . Acute blood loss anemia 04/20/2012  . Depression 06/06/2012  . Hx of cardiovascular stress test     Lexiscan Myoview (09/2013):  No definite ischemia, EF 67%; low risk  . Complication of anesthesia     "psycotic episode" after hip surg - resolved  . PAD (peripheral artery disease)     w/hx right iliac/SFA stenting and left and rt leg PTA  . CVA (cerebral vascular accident)     no residual effects evident to pt   . History of skin cancer   . Eczema     PAST SURGICAL HISTORY: Past Surgical History  Procedure Laterality Date  . Mastectomy, partial  02/03/2010    Left/Dr Rosenbower  . Cataract extraction, bilateral      with IOL/Dr Groat  . Breast lumpectomy      left breast  . Orif acetabular fracture Left 04/19/2012    Procedure: OPEN REDUCTION INTERNAL FIXATION (ORIF) ACETABULAR FRACTURE;  Surgeon: Rozanna Box, MD;  Location: [facility];  Service: Orthopedics;  Laterality: Left;  . Atrial ablation surgery      x 2 - "did not help - had to have pacemaker"  . Vascular surgery  both legs   . Cardiac catheterization    . Total knee arthroplasty Right 10/16/2013    Procedure: RIGHT TOTAL KNEE ARTHROPLASTY;  Surgeon: Gearlean Alf, MD;  Location: [facility];  Service: Orthopedics;  Laterality: Right;    SOCIAL HISTORY:  reports that she quit smoking about 22 months ago. Her smoking use included Cigarettes. She has a 55 pack-year smoking history. She has never used smokeless tobacco. She reports that she drinks alcohol. She reports that she does not use illicit drugs.  FAMILY HISTORY:  Family History  Problem Relation Age of Onset  . Heart disease Father     CURRENT MEDICATIONS: Reviewed per MAR/see medication list  REVIEW OF SYSTEMS:   See HPI otherwise 14 point ROS is negative.  PHYSICAL EXAMINATION  VS:  See VS section  GENERAL: no acute distress, moderately obese body habitus EYES: conjunctivae normal, sclerae normal, normal eye lids MOUTH/THROAT: lips without lesions,no lesions in the mouth,tongue is without lesions,uvula elevates in midline NECK: supple, trachea midline, no neck masses, no thyroid tenderness, no thyromegaly LYMPHATICS: no LAN in the neck, no supraclavicular LAN RESPIRATORY: breathing is even & unlabored, BS CTAB CARDIAC: Heart rate is irregularly irregular, no murmur,no extra heart sounds, no edema GI:  ABDOMEN: abdomen soft, normal BS, no masses, no tenderness  LIVER/SPLEEN: no hepatomegaly, no splenomegaly MUSCULOSKELETAL: HEAD: normal to inspection  EXTREMITIES: LEFT UPPER EXTREMITY: full range of motion, normal strength & tone RIGHT UPPER EXTREMITY:  full range of motion, normal strength & tone LEFT LOWER EXTREMITY:  full range of motion, normal strength & tone RIGHT LOWER EXTREMITY:  range of motion range of motion not tested due to surgery, normal strength & tone PSYCHIATRIC: the patient is alert & oriented to person, affect & behavior appropriate  LABS/RADIOLOGY:  Labs reviewed: Basic Metabolic Panel:  Recent Labs  10/09/13 1130 10/17/13 0450 10/18/13 0515  NA 141 137 139  K 4.9 4.7 4.5  CL 106 104 103  CO2 24 22 21   GLUCOSE 132* 141* 269*  BUN 16 17 18   CREATININE 0.97 1.14* 0.85  CALCIUM 9.8 8.9 9.3   Liver Function Tests:  Recent Labs  05/24/13 0924 10/09/13 1130  AST 15 23  ALT 12 12  ALKPHOS 78 81  BILITOT 0.40 0.3  PROT 7.1 7.5  ALBUMIN 3.2* 3.4*   CBC:  Recent Labs  05/24/13 0924  10/17/13 0450 10/18/13 0515 10/19/13 0432  WBC 11.3*  < > 9.5 11.4* 11.8*  NEUTROABS 7.3*  --   --   --   --   HGB 12.1  < > 11.3* 12.0 11.5*  HCT 37.1  < > 35.5* 37.0 34.3*  MCV 88.9  < > 90.1 87.7 87.1  PLT 243  < > 188 186 220  < > = values in this interval not  displayed.  CBG:  Recent Labs  10/18/13 2053 10/19/13 0727 10/19/13 1118  GLUCAP 136* 114* 149*    Echocardiography  Patient:    Alivea, Gladson MR #:       [id number] Study Date: 09/27/2013 Gender:     F Age:        42 Height:     165.1 cm Weight:     85.3 kg BSA:        2.01 m^2 Pt. Status: Room:   ATTENDING    Jolyn Nap, M.D.  SONOGRAPHER  Cindy Hazy, RDCS  ORDERING     [name]  Carney Living  PERFORMING  [facility], Outpatient  cc:  ------------------------------------------------------------------- LV EF: 55% -   60%  ------------------------------------------------------------------- Indications:      V728.1 Pre-op evaluation.  Atrial fibrillation - 427.31.  ------------------------------------------------------------------- History:   PMH:  Atrial Fibrillation. Peripheral Vascular Disease. [name]. Obstructive Sleep Apnea-CPAP. Stroke. CHF.  Risk factors: Former tobacco use. Dyslipidemia.  ------------------------------------------------------------------- Study Conclusions  - Left ventricle: Systolic function was normal. The estimated   ejection fraction was in the range of 55% to 60%. Wall motion was   normal; there were no regional wall motion abnormalities.   Features are consistent with a pseudonormal left ventricular   filling pattern, with concomitant abnormal relaxation and   increased filling pressure (grade 2 diastolic dysfunction). - Aortic valve: Severe calcification involving the noncoronary   cusp. Noncoronary cusp mobility was severely restricted. There   was mild stenosis. - Mitral valve: Calcified annulus. Mildly thickened leaflets . - Left atrium: The atrium was moderately dilated. - Tricuspid valve: There was moderate regurgitation. - Pulmonary arteries: PA peak pressure: 36 mm Hg (S).  ------------------------------------------------------------------- Labs, prior tests, procedures, and surgery: Permanent  pacemaker system implantation.  Echocardiography.  M-mode, complete 2D, spectral Doppler, and color Doppler.  Birthdate:  Patient birthdate: 1931/08/20.  Age:  Patient is 80 yr old.  Sex:  Gender: female.  Height:  Height: 165.1 cm. Height: 65 in.  Weight:  Weight: 85.3 kg. Weight: 187.6 lb.  Body mass index:  BMI: 31.3 kg/m^2.  Body surface area:    BSA: 2.01 m^2.  Blood pressure:     121/65  Patient status:  Outpatient. Study date:  Study date: 09/27/2013. Study time: 01:25 PM. Location:  [facility]  -------------------------------------------------------------------  ------------------------------------------------------------------- Left ventricle:  Systolic function was normal. The estimated ejection fraction was in the range of 55% to 60%. Wall motion was normal; there were no regional wall motion abnormalities. Features are consistent with a pseudonormal left ventricular filling pattern, with concomitant abnormal relaxation and increased filling pressure (grade 2 diastolic dysfunction).  ------------------------------------------------------------------- Aortic valve:   Trileaflet; mildly thickened, mildly calcified leaflets.  Severe calcification involving the noncoronary cusp. Noncoronary cusp mobility was severely restricted.  Doppler: There was mild stenosis.   There was no significant regurgitation.   VTI ratio of LVOT to aortic valve: 0.64. Valve area (VTI): 1.95 cm^2. Indexed valve area (VTI): 0.97 cm^2/m^2. Peak velocity ratio of LVOT to aortic valve: 0.59. Valve area (Vmax): 1.8 cm^2. Indexed valve area (Vmax): 0.9 cm^2/m^2.    Mean gradient (S): 10 mm Hg. Peak gradient (S): 18 mm Hg.  ------------------------------------------------------------------- Aorta:  Ascending aorta: The ascending aorta was mildly dilated.  ------------------------------------------------------------------- Mitral valve:   Calcified annulus. Mildly thickened leaflets . Doppler:   There was trivial regurgitation.    Peak gradient (D): 6 mm Hg.  ------------------------------------------------------------------- Left atrium:  The atrium was moderately dilated.  ------------------------------------------------------------------- Right ventricle:  The cavity size was normal. Wall thickness was normal. Pacer wire or catheter noted in right ventricle. Systolic function was normal.  ------------------------------------------------------------------- Pulmonic valve:    The valve appears to be grossly normal. Doppler:  There was no significant regurgitation.  ------------------------------------------------------------------- Tricuspid valve:   Structurally normal valve.   Leaflet separation was normal.  Doppler:  Transvalvular velocity was within the normal range. There was moderate regurgitation.  ------------------------------------------------------------------- Pulmonary artery:   Systolic pressure was at the upper limits of normal.  ------------------------------------------------------------------- Right atrium:  The atrium was normal in size. Pacer wire or catheter noted in right atrium.  ------------------------------------------------------------------- Pericardium:  There was no pericardial effusion.  ------------------------------------------------------------------- Systemic veins: Inferior vena cava: The vessel was normal in size. The respirophasic diameter changes were in the normal range (>= 50%), consistent with normal central venous pressure.  ------------------------------------------------------------------- Prepared and Electronically Authenticated by  Sanda Klein, MD 2015-07-22T15:36:59  ------------------------------------------------------------------- Measurements   Left ventricle                           Value           Reference  LV ID, ED, PLAX chordal          (L)     41     mm       43 - 52  LV ID, ES, PLAX chordal           (N)     25     mm       23 - 38  LV fx shortening, PLAX chordal   (N)     39     %        >=29  LV PW thickness, ED                      10     mm       ---------  IVS/LV PW ratio, ED              (N)     1               <=1.3  Stroke volume, 2D                        72     ml       ---------  Stroke volume/bsa, 2D                    36     ml/m^2   ---------  LV e&', lateral                           7.46   cm/s     ---------  LV E/e&', lateral                         16.89           ---------  LV e&', medial                            8.27   cm/s     ---------  LV E/e&', medial                          15.24           ---------  LV e&', average                           7.87   cm/s     ---------  LV E/e&', average                         16.02           ---------  LV ejection time  320    ms       ---------    Ventricular septum                       Value           Reference  IVS thickness, ED                        10     mm       ---------    LVOT                                     Value           Reference  LVOT ID, S                               19.7   mm       ---------  LVOT area                                3.05   cm^2     ---------  LVOT peak velocity, S                    126.5  cm/s     ---------  LVOT VTI, S                              29.81  cm       ---------  LVOT peak gradient, S                    6      mm Hg    ---------  Stroke volume (SV), LVOT DP              90.9   ml       ---------  Stroke index (SV/bsa), LVOT DP           45.3   ml/m^2   ---------    Aortic valve                             Value           Reference  Aortic valve peak velocity, S            214.33 cm/s     ---------  Aortic valve mean velocity, S            144.48 cm/s     ---------  Aortic valve VTI, S                      46.29  cm       ---------  Aortic mean gradient, S                  10     mm Hg    ---------  Aortic peak gradient, S                   18     mm Hg    ---------  VTI ratio, LVOT/AV  0.64            ---------  Aortic valve area, VTI                   1.95   cm^2     ---------  Aortic valve area/bsa, VTI               0.97   cm^2/m^2 ---------  Velocity ratio, peak, LVOT/AV            0.59            ---------  Aortic valve area, peak velocity         1.8    cm^2     ---------  Aortic valve area/bsa, peak              0.9    cm^2/m^2 ---------  velocity    Aorta                                    Value           Reference  Aortic root ID, ED                       25     mm       ---------  Ascending aorta ID, A-P, S               39     mm       ---------    Left atrium                              Value           Reference  LA ID, A-P, ES                           46     mm       ---------  LA ID/bsa, A-P                   (H)     2.29   cm/m^2   <=2.2    Mitral valve                             Value           Reference  Mitral E-wave peak velocity              126    cm/s     ---------  Mitral A-wave peak velocity              63.4   cm/s     ---------  Mitral deceleration time         (H)     268    ms       150 - 230  Mitral peak gradient, D                  6      mm Hg    ---------  Mitral E/A ratio, peak                   2               ---------  Pulmonary arteries                       Value           Reference  PA pressure, S, DP               (H)     36     mm Hg    <=30    Tricuspid valve                          Value           Reference  Tricuspid regurg peak velocity           287.5  cm/s     ---------  Tricuspid peak RV-RA gradient            33     mm Hg    ---------  Tricuspid maximal regurg                 287.5  cm/s     ---------  velocity, PISA    Systemic veins                           Value           Reference  Estimated CVP                            3      mm Hg    ---------    Right ventricle                          Value           Reference  RV pressure, S, DP                (H)     36     mm Hg    <=30  RV s&', lateral, S                        11.4   cm/s     ---------  CHEST  2 VIEW   COMPARISON:  Prior radiograph from 10/09/2013   FINDINGS: Dual lead transvenous left-sided pacemaker/ AICD with electrodes overlying the right atrium and right ventricle is stable. Allowing for AP technique, transverse heart size is likely unchanged. Tortuosity of the intrathoracic aorta noted. Mediastinal silhouette within normal limits.   Lungs are normally inflated. There is subtly increased pulmonary vascular congestion as compared to prior study, suggesting early or developing pulmonary edema. Minimal left basilar atelectasis present. Small bilateral pleural effusions are present. No focal infiltrate. No pneumothorax.   No acute osseous abnormality   IMPRESSION: 1. Increased pulmonary vascular congestion as compared to prior study, suggesting early/developing pulmonary edema. 2. Small bilateral pleural effusions. 3. Mild left basilar atelectasis.    ASSESSMENT/PLAN:  Right knee osteoarthritis-status post total knee arthroplasty. Continue rehabilitation. Atrial fibrillation-rate controlled COPD-compensated Acute blood loss anemia-check hemoglobin level [name]-stable CHF-compensated Chronic kidney disease stage III-check renal functions Check CBC and BMP  I have reviewed patient's medical records received at admission/from hospitalization.  CPT CODE: 42706  [address] Yulisa [place], [place] [phone]

## 2013-10-31 ENCOUNTER — Encounter: Payer: Self-pay | Admitting: Adult Health

## 2013-10-31 ENCOUNTER — Non-Acute Institutional Stay (SKILLED_NURSING_FACILITY): Payer: Medicare Other | Admitting: Adult Health

## 2013-10-31 DIAGNOSIS — I5032 Chronic diastolic (congestive) heart failure: Secondary | ICD-10-CM

## 2013-10-31 DIAGNOSIS — M1710 Unilateral primary osteoarthritis, unspecified knee: Secondary | ICD-10-CM

## 2013-10-31 DIAGNOSIS — E1159 Type 2 diabetes mellitus with other circulatory complications: Secondary | ICD-10-CM

## 2013-10-31 DIAGNOSIS — I4891 Unspecified atrial fibrillation: Secondary | ICD-10-CM | POA: Diagnosis not present

## 2013-10-31 DIAGNOSIS — J449 Chronic obstructive pulmonary disease, unspecified: Secondary | ICD-10-CM

## 2013-10-31 DIAGNOSIS — C50919 Malignant neoplasm of unspecified site of unspecified female breast: Secondary | ICD-10-CM

## 2013-10-31 DIAGNOSIS — E559 Vitamin D deficiency, unspecified: Secondary | ICD-10-CM

## 2013-10-31 DIAGNOSIS — I509 Heart failure, unspecified: Secondary | ICD-10-CM

## 2013-10-31 DIAGNOSIS — B370 Candidal stomatitis: Secondary | ICD-10-CM | POA: Diagnosis not present

## 2013-10-31 DIAGNOSIS — I482 Chronic atrial fibrillation, unspecified: Secondary | ICD-10-CM

## 2013-10-31 DIAGNOSIS — E785 Hyperlipidemia, unspecified: Secondary | ICD-10-CM

## 2013-10-31 DIAGNOSIS — M1711 Unilateral primary osteoarthritis, right knee: Secondary | ICD-10-CM

## 2013-11-01 DIAGNOSIS — G8918 Other acute postprocedural pain: Secondary | ICD-10-CM | POA: Diagnosis not present

## 2013-11-01 DIAGNOSIS — M25569 Pain in unspecified knee: Secondary | ICD-10-CM | POA: Diagnosis not present

## 2013-11-06 DIAGNOSIS — G8918 Other acute postprocedural pain: Secondary | ICD-10-CM | POA: Diagnosis not present

## 2013-11-06 DIAGNOSIS — M25569 Pain in unspecified knee: Secondary | ICD-10-CM | POA: Diagnosis not present

## 2013-11-06 NOTE — Progress Notes (Signed)
Patient ID: [name], female   DOB: [birth year], 80 y.o.   MRN: [id number]              PROGRESS NOTE  DATE:   10/31/13  FACILITY: Nursing Home Location: [facility]  LEVEL OF CARE: SNF (31)  Acute Visit  CHIEF COMPLAINT:  Discharge Notes  HISTORY OF PRESENT ILLNESS: This is an 80 year old female who is for discharge home with outpatient rehabilitation. She has been admitted to [facility] on 10/19/13 from [facility] with Osteoarthritis S/P right total knee arthroplasty. Patient was admitted to this facility for short-term rehabilitation after the patient's recent hospitalization.  Patient has completed SNF rehabilitation and therapy has cleared the patient for discharge.  REASSESSMENT OF ONGOING PROBLEM(S):  COPD: the COPD remains stable.  Pt denies sob, cough, wheezing or declining exercise tolerance.  No complications from the medications presently being used.  DM:pt's DM remains stable.  Pt denies polyuria, polydipsia, polyphagia, changes in vision or hypoglycemic episodes.  No complications noted from the medication presently being used.   5/15 hemoglobin A1c is: 6.8  HYPERLIPIDEMIA: No complications from the medications presently being used. Last fasting lipid panel showed : not available  PAST MEDICAL HISTORY : Reviewed.  No changes/see problem list  CURRENT MEDICATIONS: Reviewed per MAR/see medication list  REVIEW OF SYSTEMS:  GENERAL: no change in appetite, no fatigue, no weight changes, no fever, chills or weakness RESPIRATORY: no cough, SOB, DOE, wheezing, hemoptysis CARDIAC: no chest pain, edema or palpitations GI: no abdominal pain, diarrhea, constipation, heart burn, nausea or vomiting  PHYSICAL EXAMINATION  GENERAL: no acute distress, normal body habitus SKIN; right knee surgical wound is dry, no redness NECK: supple, trachea midline, no neck masses, no thyroid tenderness, no thyromegaly RESPIRATORY: breathing is even &  unlabored, BS CTAB CARDIAC: RRR, no murmur,no extra heart sounds, no edema GI: abdomen soft, normal BS, no masses, no tenderness, no hepatomegaly, no splenomegaly EXTREMITIES: able to move all 4 extremities  PSYCHIATRIC: the patient is alert & oriented to person, affect & behavior appropriate  LABS/RADIOLOGY: 10/25/13  WBC 9.2 hemoglobin 11.4 hematocrit 36.3 MCV 90.8 sodium 138 potassium 4.5 glucose 137 BUN 22 creatinine 1.0 calcium 8.8 Labs reviewed: Basic Metabolic Panel:  Recent Labs  10/09/13 1130 10/17/13 0450 10/18/13 0515  NA 141 137 139  K 4.9 4.7 4.5  CL 106 104 103  CO2 24 22 21   GLUCOSE 132* 141* 269*  BUN 16 17 18   CREATININE 0.97 1.14* 0.85  CALCIUM 9.8 8.9 9.3   Liver Function Tests:  Recent Labs  05/24/13 0924 10/09/13 1130  AST 15 23  ALT 12 12  ALKPHOS 78 81  BILITOT 0.40 0.3  PROT 7.1 7.5  ALBUMIN 3.2* 3.4*   CBC:  Recent Labs  05/24/13 0924  10/17/13 0450 10/18/13 0515 10/19/13 0432  WBC 11.3*  < > 9.5 11.4* 11.8*  NEUTROABS 7.3*  --   --   --   --   HGB 12.1  < > 11.3* 12.0 11.5*  HCT 37.1  < > 35.5* 37.0 34.3*  MCV 88.9  < > 90.1 87.7 87.1  PLT 243  < > 188 186 220  < > = values in this interval not displayed.  CBG:  Recent Labs  10/18/13 2053 10/19/13 0727 10/19/13 1118  GLUCAP 136* 114* 149*    EXAM: CHEST  2 VIEW   COMPARISON:  Prior radiograph from 10/09/2013   FINDINGS: Dual lead transvenous left-sided pacemaker/  AICD with electrodes overlying the right atrium and right ventricle is stable. Allowing for AP technique, transverse heart size is likely unchanged. Tortuosity of the intrathoracic aorta noted. Mediastinal silhouette within normal limits.   Lungs are normally inflated. There is subtly increased pulmonary vascular congestion as compared to prior study, suggesting early or developing pulmonary edema. Minimal left basilar atelectasis present. Small bilateral pleural effusions are present. No  focal infiltrate. No pneumothorax.   No acute osseous abnormality   IMPRESSION: 1. Increased pulmonary vascular congestion as compared to prior study, suggesting early/developing pulmonary edema. 2. Small bilateral pleural effusions. 3. Mild left basilar atelectasis.    ASSESSMENT/PLAN:  Osteoarthritis status post right total knee arthroplasty - for outpatient rehabilitation Diabetes mellitus, type II - well controlled; continue Glucophage COPD - continue Spiriva and Advair Diastolic heart failure - stable; continue Demadex Hyperlipidemia - continue Lipitor History of left breast cancer - continue Femara Chronic atrial fibrillation - continue Elequis and atenolol Vitamin D deficiency - continue supplementation   I have filled out patient's discharge paperwork and written prescriptions.  Patient will have outpatient rehabilitation.  Total discharge time: Greater than 30 minutes  Discharge time involved coordination of the discharge process with social worker, nursing staff and therapy department.     CPT CODE: 45625   Seth Bake - NP [facility] [phone]

## 2013-11-07 ENCOUNTER — Encounter: Payer: Self-pay | Admitting: Internal Medicine

## 2013-11-07 ENCOUNTER — Ambulatory Visit (INDEPENDENT_AMBULATORY_CARE_PROVIDER_SITE_OTHER): Payer: Medicare Other | Admitting: Internal Medicine

## 2013-11-07 VITALS — BP 122/72 | HR 77 | Temp 97.9°F | Resp 18 | Ht 64.0 in | Wt 189.4 lb

## 2013-11-07 DIAGNOSIS — E1159 Type 2 diabetes mellitus with other circulatory complications: Secondary | ICD-10-CM | POA: Diagnosis not present

## 2013-11-07 DIAGNOSIS — Z23 Encounter for immunization: Secondary | ICD-10-CM

## 2013-11-07 DIAGNOSIS — I4891 Unspecified atrial fibrillation: Secondary | ICD-10-CM

## 2013-11-07 DIAGNOSIS — I2510 Atherosclerotic heart disease of native coronary artery without angina pectoris: Secondary | ICD-10-CM | POA: Diagnosis not present

## 2013-11-07 DIAGNOSIS — I482 Chronic atrial fibrillation, unspecified: Secondary | ICD-10-CM

## 2013-11-07 DIAGNOSIS — J439 Emphysema, unspecified: Secondary | ICD-10-CM

## 2013-11-07 DIAGNOSIS — E785 Hyperlipidemia, unspecified: Secondary | ICD-10-CM

## 2013-11-07 DIAGNOSIS — J438 Other emphysema: Secondary | ICD-10-CM

## 2013-11-07 NOTE — Patient Instructions (Signed)
We will see you back in about 3 months to check on your sugars. Keep working on exercising your knee with therapy. Once you are back on your feet work on walking or exercising for about 30 minutes for 3-4 times per week. If you have any problems before then please call our office. We will give you the flu shot today to help prevent you from getting the flu.   Total Knee Replacement, Care After These instructions give you information on caring for yourself after your procedure. Your doctor also may give you specific instructions. Call your doctor if you have any problems or questions after your procedure. HOME CARE  See a physical therapist as told by your doctor.  Take medicines as told by your doctor.  Do not lift or drive until your doctor says it is okay.  Use crutches, a walker, or a cane as told by your doctor.  If you were given a knee joint motion machine, use it as told. GET HELP IF:  You have trouble breathing.  Your wound is red, puffy, or is getting more painful.  You have yellowish-white fluid (pus) coming from your wound.  You have a bad smell coming from your wound.  Your wound will not stop bleeding.  Your wound opens up after sutures (stitches) or staples are removed.  You have a fever. GET HELP RIGHT AWAY IF:   You have a rash.  You have shortness of breath or chest pain.  You have pain or puffiness (swelling) in your calf or thigh.  Your ability to move your knee is decreasing rather than increasing.  Your knee pain is increasing daily. MAKE SURE YOU:   Understand these instructions.  Will watch your condition.  Will get help right away if you are not doing well or get worse. Document Released: 05/18/2011 Document Revised: 07/10/2013 Document Reviewed: 05/18/2011 [facility] Patient Information 2015 [place], [place]. This information is not intended to replace advice given to you by your health care provider. Make sure you discuss any questions you have  with your health care provider.

## 2013-11-07 NOTE — Progress Notes (Signed)
Pre visit review using our clinic review tool, if applicable. No additional management support is needed unless otherwise documented below in the visit note. 

## 2013-11-07 NOTE — Assessment & Plan Note (Addendum)
No problems with chest pains during or after surgery. She continues on lipitor.

## 2013-11-07 NOTE — Progress Notes (Signed)
   Subjective:    Patient ID: [name], female    DOB: [birth year], 80 y.o.   MRN: [id number]  HPI The patient is coming in today for a follow up of a nursing home stay. She is an 80 YO woman who had right knee replacement on 10/16/13 and underwent therapy in nursing home until the end of August. She is doing fairly well at home and has had no falls. She is still using a walker to get around and starts doing physical therapy soon. She has PMH of type 2 DM, A fib, OSA, pacemaker, COPD. She continues to be a nonsmoker although she does miss smoking. She would like to lose some weight once she is able to move around again without pain. Previously her knee pain limited her movement. She denies any increase in weight and is having some mild pedal edema since she is walking around again. No worsening problems with her breathing and she uses her spiriva and advair daily. She has not needed her xopenex in some time. She is still taking 1 pain medication and she cannot recall which one it is. She continues to take metformin for her diabetes without problem. She has been taking miralax lately to help her bowels move with the pain medications and recent surgery.    Review of Systems  Constitutional: Positive for activity change. Negative for fever, chills, appetite change, fatigue and unexpected weight change.  HENT: Negative for congestion and rhinorrhea.   Respiratory: Negative for cough, chest tightness, shortness of breath, wheezing and stridor.   Cardiovascular: Positive for leg swelling. Negative for chest pain and palpitations.       Mild pedal edema  Gastrointestinal: Positive for constipation. Negative for nausea, vomiting, diarrhea and abdominal distention.  Musculoskeletal: Positive for arthralgias and gait problem.  Skin: Positive for wound.       Healing wound on right knee s/p knee replacement. Appears to be well healing and no signs of infection or erythema or pus.  Neurological: Negative  for dizziness, tremors, syncope, weakness, numbness and headaches.       Objective:   Physical Exam  Constitutional: She is oriented to person, place, and time. She appears well-developed and well-nourished. No distress.  HENT:  Head: Normocephalic and atraumatic.  Eyes: EOM are normal. Pupils are equal, round, and reactive to light.  Neck: Normal range of motion. Neck supple. No JVD present.  Cardiovascular: Normal rate.   Paced  Pulmonary/Chest: Effort normal and breath sounds normal. No respiratory distress. She has no wheezes. She has no rales. She exhibits no tenderness.  Abdominal: Soft. Bowel sounds are normal. She exhibits no distension. There is no tenderness. There is no rebound and no guarding.  Neurological: She is alert and oriented to person, place, and time. No cranial nerve deficit.  Skin: She is not diaphoretic.  Healing wound on right knee s/p knee replacement. Appears to be well healing and no signs of infection or erythema or pus.          Assessment & Plan:

## 2013-11-07 NOTE — Assessment & Plan Note (Addendum)
Patient still abstaining from cigarettes and encouraged her. She is taking spiriva and advair. She has xopenex and has not needed it in some time.

## 2013-11-07 NOTE — Assessment & Plan Note (Addendum)
She does follow with Dr. Caryl Comes and is on eliquis and has permanent pacemaker. She is also on atenolol. Rate at target today.

## 2013-11-07 NOTE — Assessment & Plan Note (Addendum)
Last HgA1c 6.8 and patient deferred HgA1c today given extreme amounts of testing prior to her knee surgery. She will return in 2-3 months for HgA1c and return visit to check on her progress with her knee rehabilitation. Foot exam performed today and no abnormalities. Continue on metformin (she still has room to increase dosing if needed).

## 2013-11-08 DIAGNOSIS — M25569 Pain in unspecified knee: Secondary | ICD-10-CM | POA: Diagnosis not present

## 2013-11-08 DIAGNOSIS — G8918 Other acute postprocedural pain: Secondary | ICD-10-CM | POA: Diagnosis not present

## 2013-11-08 NOTE — Assessment & Plan Note (Signed)
Continue lipitor  ?

## 2013-11-15 DIAGNOSIS — G8918 Other acute postprocedural pain: Secondary | ICD-10-CM | POA: Diagnosis not present

## 2013-11-15 DIAGNOSIS — M25569 Pain in unspecified knee: Secondary | ICD-10-CM | POA: Diagnosis not present

## 2013-11-16 ENCOUNTER — Other Ambulatory Visit: Payer: Self-pay | Admitting: Geriatric Medicine

## 2013-11-16 MED ORDER — APIXABAN 5 MG PO TABS: 5.0000 mg | ORAL_TABLET | Freq: Two times a day (BID) | ORAL | Status: DC

## 2013-11-17 DIAGNOSIS — Z96659 Presence of unspecified artificial knee joint: Secondary | ICD-10-CM | POA: Diagnosis not present

## 2013-11-17 DIAGNOSIS — Z471 Aftercare following joint replacement surgery: Secondary | ICD-10-CM | POA: Diagnosis not present

## 2013-11-20 DIAGNOSIS — M25569 Pain in unspecified knee: Secondary | ICD-10-CM | POA: Diagnosis not present

## 2013-11-20 DIAGNOSIS — G8918 Other acute postprocedural pain: Secondary | ICD-10-CM | POA: Diagnosis not present

## 2013-11-22 ENCOUNTER — Telehealth: Payer: Self-pay | Admitting: Internal Medicine

## 2013-11-22 ENCOUNTER — Other Ambulatory Visit: Payer: Self-pay | Admitting: Oncology

## 2013-11-22 DIAGNOSIS — G8918 Other acute postprocedural pain: Secondary | ICD-10-CM | POA: Diagnosis not present

## 2013-11-22 DIAGNOSIS — M25569 Pain in unspecified knee: Secondary | ICD-10-CM | POA: Diagnosis not present

## 2013-11-22 MED ORDER — SENNOSIDES-DOCUSATE SODIUM 8.6-50 MG PO TABS: 1.0000 | ORAL_TABLET | Freq: Two times a day (BID) | ORAL | Status: DC

## 2013-11-22 MED ORDER — POLYETHYLENE GLYCOL 3350 17 G PO PACK: 17.0000 g | PACK | Freq: Every day | ORAL | Status: DC | PRN

## 2013-11-22 MED ORDER — LETROZOLE 2.5 MG PO TABS: 2.5000 mg | ORAL_TABLET | Freq: Every day | ORAL | Status: DC

## 2013-11-22 NOTE — Telephone Encounter (Signed)
Have sent in senokot which should help to move her bowels. She can take 1 pill twice a day until she moves her bowels then once a day while on the medicine. She should stop for loose stools. She can also try taking the miralax twice a day to see if this helps. Make sure she is drinking lots of water.

## 2013-11-22 NOTE — Telephone Encounter (Signed)
Patient is having really bad constipation.  She has had it off and on since she had leg surgery in August.  She is also on pain meds that she believes is interfering with her bowel movements.  She has tried eating prunes but his has not helped.  She would like a call back in regards to what she might be able to do.

## 2013-11-22 NOTE — Addendum Note (Signed)
Addended by: Vertell Novak A on: 11/22/2013 11:40 AM   Modules accepted: Orders

## 2013-11-22 NOTE — Telephone Encounter (Signed)
Will send in refill of miralax. She can use 1 packet daily and would continue while she is on the pain medication.  Dr. Doug Sou

## 2013-11-22 NOTE — Telephone Encounter (Signed)
Pt states she already has miralax and it is not working. Please advise

## 2013-11-24 NOTE — Telephone Encounter (Signed)
[email or web address]

## 2013-11-27 DIAGNOSIS — G8918 Other acute postprocedural pain: Secondary | ICD-10-CM | POA: Diagnosis not present

## 2013-11-27 DIAGNOSIS — M25569 Pain in unspecified knee: Secondary | ICD-10-CM | POA: Diagnosis not present

## 2013-11-29 DIAGNOSIS — G8918 Other acute postprocedural pain: Secondary | ICD-10-CM | POA: Diagnosis not present

## 2013-11-29 DIAGNOSIS — M25569 Pain in unspecified knee: Secondary | ICD-10-CM | POA: Diagnosis not present

## 2013-12-04 DIAGNOSIS — M25569 Pain in unspecified knee: Secondary | ICD-10-CM | POA: Diagnosis not present

## 2013-12-04 DIAGNOSIS — G8918 Other acute postprocedural pain: Secondary | ICD-10-CM | POA: Diagnosis not present

## 2013-12-06 DIAGNOSIS — G8918 Other acute postprocedural pain: Secondary | ICD-10-CM | POA: Diagnosis not present

## 2013-12-06 DIAGNOSIS — M25569 Pain in unspecified knee: Secondary | ICD-10-CM | POA: Diagnosis not present

## 2013-12-11 DIAGNOSIS — M25561 Pain in right knee: Secondary | ICD-10-CM | POA: Diagnosis not present

## 2013-12-11 DIAGNOSIS — G8918 Other acute postprocedural pain: Secondary | ICD-10-CM | POA: Diagnosis not present

## 2013-12-13 ENCOUNTER — Ambulatory Visit (INDEPENDENT_AMBULATORY_CARE_PROVIDER_SITE_OTHER): Payer: Medicare Other | Admitting: Critical Care Medicine

## 2013-12-13 ENCOUNTER — Encounter: Payer: Self-pay | Admitting: Critical Care Medicine

## 2013-12-13 VITALS — BP 100/68 | HR 68 | Ht 65.5 in | Wt 183.0 lb

## 2013-12-13 DIAGNOSIS — I2510 Atherosclerotic heart disease of native coronary artery without angina pectoris: Secondary | ICD-10-CM | POA: Diagnosis not present

## 2013-12-13 DIAGNOSIS — J439 Emphysema, unspecified: Secondary | ICD-10-CM

## 2013-12-13 DIAGNOSIS — J449 Chronic obstructive pulmonary disease, unspecified: Secondary | ICD-10-CM | POA: Diagnosis not present

## 2013-12-13 DIAGNOSIS — Z23 Encounter for immunization: Secondary | ICD-10-CM

## 2013-12-13 NOTE — Assessment & Plan Note (Signed)
Gold stage C. COPD stable at this time Note no smoking since 2013 successful basis Plan Prevnar 13 vaccine was given Tdap was given (whooping cough vaccine) Refills on inhalers sent to include nasonex Return 6 months

## 2013-12-13 NOTE — Patient Instructions (Signed)
Prevnar 13 vaccine was given Tdap was given (whooping cough vaccine) Refills on inhalers sent to include nasonex Return 6 months

## 2013-12-13 NOTE — Progress Notes (Signed)
Subjective:    Patient ID: [name], female    DOB: 1932-01-23, 80 y.o.   MRN: [id number]  HPI  12/13/2013 Chief Complaint  Patient presents with  . Follow-up    Pt states her breathing is at baseline.  SOB with exertion, prod cough with creamy mucus, worse in mornings.  Had knee sx in August which has slowed her down some.  Pt tolerating cpap/nocturnal 02 well.    Cough is at baseline.  occ creamy mucus, in the AM.  On cpap qhs plus oxygen.   ?MOLD issue.  Pt had R TKR.  Helped the pain.  Mobility is worse , pain is better.  Pt denies any significant sore throat, nasal congestion or excess secretions, fever, chills, sweats, unintended weight loss, pleurtic or exertional chest pain, orthopnea PND, or leg swelling Pt denies any increase in rescue therapy over baseline, denies waking up needing it or having any early am or nocturnal exacerbations of coughing/wheezing/or dyspnea. Pt also denies any obvious fluctuation in symptoms with  weather or environmental change or other alleviating or aggravating factors   Review of Systems  neg for any significant sore throat, dysphagia, itching, sneezing, nasal congestion or excess/ purulent secretions, fever, chills, sweats, unintended wt loss, pleuritic or exertional cp, hempoptysis, orthopnea pnd or change in chronic leg swelling. Also denies presyncope, palpitations, heartburn, abdominal pain, nausea, vomiting, diarrhea or change in bowel or urinary habits, dysuria,hematuria, rash, arthralgias, visual complaints, headache, numbness weakness or ataxia.     Objective:   Physical Exam  BP 100/68  Pulse 68  Ht 5' 5.5" (1.664 m)  Wt 183 lb (83.008 kg)  BMI 29.98 kg/m2  SpO2 99%  Gen. Pleasant, well-nourished, in no distress ENT - no lesions, no post nasal drip Neck: No JVD, no thyromegaly, no carotid bruits Lungs: no use of accessory muscles, no dullness to percussion, distant breath sounds Cardiovascular: Rhythm regular, heart  sounds  normal, no murmurs or gallops, no peripheral edema Musculoskeletal: No deformities, no cyanosis or clubbing        Assessment & Plan:   Gold stage C. COPD with frequent exacerbations Gold stage C. COPD stable at this time Note no smoking since 2013 successful basis Plan Prevnar 13 vaccine was given Tdap was given (whooping cough vaccine) Refills on inhalers sent to include nasonex Return 6 months     Updated Medication List Outpatient Encounter Prescriptions as of 12/13/2013  Medication Sig  . acetaminophen (TYLENOL) 325 MG tablet Take 2 tablets (650 mg total) by mouth every 6 (six) hours as needed for mild pain (or Fever >/= 101).  Marland Kitchen apixaban (ELIQUIS) 5 MG TABS tablet Take 1 tablet (5 mg total) by mouth 2 (two) times daily.  Marland Kitchen atenolol (TENORMIN) 25 MG tablet Take 25 mg by mouth every morning.   Marland Kitchen atorvastatin (LIPITOR) 20 MG tablet Take 20 mg by mouth every evening.  . Calcium Carbonate-Vitamin D (CALCIUM + D PO) Take 1 tablet by mouth 3 (three) times a week.  . cholecalciferol (VITAMIN D) 1000 UNITS tablet Take 1,000 Units by mouth daily.  . Cyanocobalamin (VITAMIN B-12 CR PO) Take 1 tablet by mouth daily.  Marland Kitchen docusate sodium (COLACE) 100 MG capsule Take 100 mg by mouth 2 (two) times daily.  . Fluticasone-Salmeterol (ADVAIR) 250-50 MCG/DOSE AEPB Inhale 1 puff into the lungs 2 (two) times daily.  Marland Kitchen HYDROmorphone (DILAUDID) 2 MG tablet Take 1/2 tablet by mouth every 4 hours as needed for moderate pain; Take one tablet  by mouth every 4 hours as needed for severe pain  . letrozole (FEMARA) 2.5 MG tablet TAKE 1 TABLET (2.5 MG TOTAL) BY MOUTH DAILY.  Marland Kitchen letrozole (FEMARA) 2.5 MG tablet Take 1 tablet (2.5 mg total) by mouth daily.  Marland Kitchen levalbuterol (XOPENEX HFA) 45 MCG/ACT inhaler Inhale 2 puffs into the lungs every 6 (six) hours as needed. For shortness of breath  . magnesium oxide (MAG-OX) 400 MG tablet Take 400 mg by mouth daily.  . metFORMIN (GLUCOPHAGE) 500 MG tablet Take 1  tablet (500 mg total) by mouth 2 (two) times daily with a meal.  . potassium chloride SA (K-DUR,KLOR-CON) 20 MEQ tablet Take 40-60 mEq by mouth 2 (two) times daily. Take 3 tablets in morning and Take 2 tablets every evening  . tiotropium (SPIRIVA HANDIHALER) 18 MCG inhalation capsule Place 1 capsule (18 mcg total) into inhaler and inhale daily.  Marland Kitchen torsemide (DEMADEX) 10 MG tablet Take 10 mg by mouth every morning.  . traMADol (ULTRAM) 50 MG tablet Take 1-2 tablets (50-100 mg total) by mouth every 6 (six) hours as needed for moderate pain.  . mometasone (NASONEX) 50 MCG/ACT nasal spray Place 2 sprays into the nose daily as needed (allergies).   . senna-docusate (SENOKOT-S) 8.6-50 MG per tablet Take 1 tablet by mouth 2 (two) times daily. Stop for diarrhea or loose stools  . [DISCONTINUED] bisacodyl (DULCOLAX) 10 MG suppository Place 1 suppository (10 mg total) rectally daily as needed for moderate constipation.  . [DISCONTINUED] docusate sodium 100 MG CAPS Take 100 mg by mouth 2 (two) times daily.  . [DISCONTINUED] methocarbamol (ROBAXIN) 500 MG tablet Take 1 tablet (500 mg total) by mouth every 6 (six) hours as needed for muscle spasms.  . [DISCONTINUED] metoCLOPramide (REGLAN) 5 MG tablet Take 1-2 tablets (5-10 mg total) by mouth every 8 (eight) hours as needed for nausea (if ondansetron (ZOFRAN) ineffective.).  . [DISCONTINUED] ondansetron (ZOFRAN) 4 MG tablet Take 1 tablet (4 mg total) by mouth every 6 (six) hours as needed for nausea.  . [DISCONTINUED] OVER THE COUNTER MEDICATION Take 1 tablet by mouth daily. procera  . [DISCONTINUED] OVER THE COUNTER MEDICATION Take 1 tablet by mouth daily. ceraplex  . [DISCONTINUED] polyethylene glycol (MIRALAX / GLYCOLAX) packet Take 17 g by mouth daily as needed for mild constipation.  . [DISCONTINUED] VITAMIN E PO Take 1 capsule by mouth every Monday, Wednesday, and Friday.

## 2013-12-14 ENCOUNTER — Encounter: Payer: Self-pay | Admitting: Internal Medicine

## 2013-12-14 DIAGNOSIS — Z950 Presence of cardiac pacemaker: Secondary | ICD-10-CM | POA: Diagnosis not present

## 2013-12-14 DIAGNOSIS — I495 Sick sinus syndrome: Secondary | ICD-10-CM | POA: Diagnosis not present

## 2013-12-15 DIAGNOSIS — M25561 Pain in right knee: Secondary | ICD-10-CM | POA: Diagnosis not present

## 2013-12-15 DIAGNOSIS — G8918 Other acute postprocedural pain: Secondary | ICD-10-CM | POA: Diagnosis not present

## 2013-12-18 DIAGNOSIS — G8918 Other acute postprocedural pain: Secondary | ICD-10-CM | POA: Diagnosis not present

## 2013-12-18 DIAGNOSIS — M25561 Pain in right knee: Secondary | ICD-10-CM | POA: Diagnosis not present

## 2013-12-20 DIAGNOSIS — G8918 Other acute postprocedural pain: Secondary | ICD-10-CM | POA: Diagnosis not present

## 2013-12-20 DIAGNOSIS — M25561 Pain in right knee: Secondary | ICD-10-CM | POA: Diagnosis not present

## 2013-12-29 ENCOUNTER — Ambulatory Visit
Admission: RE | Admit: 2013-12-29 | Discharge: 2013-12-29 | Disposition: A | Discharge status: Home / Self Care | Payer: Medicare Other | Source: Ambulatory Visit | Attending: Oncology | Admitting: Oncology

## 2013-12-29 DIAGNOSIS — M810 Age-related osteoporosis without current pathological fracture: Secondary | ICD-10-CM | POA: Diagnosis not present

## 2013-12-29 DIAGNOSIS — C50919 Malignant neoplasm of unspecified site of unspecified female breast: Secondary | ICD-10-CM

## 2013-12-29 DIAGNOSIS — Z9889 Other specified postprocedural states: Secondary | ICD-10-CM

## 2013-12-29 DIAGNOSIS — Z853 Personal history of malignant neoplasm of breast: Secondary | ICD-10-CM | POA: Diagnosis not present

## 2013-12-29 DIAGNOSIS — M8580 Other specified disorders of bone density and structure, unspecified site: Secondary | ICD-10-CM

## 2014-01-09 ENCOUNTER — Other Ambulatory Visit: Payer: Self-pay | Admitting: *Deleted

## 2014-01-09 DIAGNOSIS — N2581 Secondary hyperparathyroidism of renal origin: Secondary | ICD-10-CM | POA: Diagnosis not present

## 2014-01-09 DIAGNOSIS — N189 Chronic kidney disease, unspecified: Secondary | ICD-10-CM | POA: Diagnosis not present

## 2014-01-09 DIAGNOSIS — C50919 Malignant neoplasm of unspecified site of unspecified female breast: Secondary | ICD-10-CM

## 2014-01-09 DIAGNOSIS — Z0000 Encounter for general adult medical examination without abnormal findings: Secondary | ICD-10-CM | POA: Diagnosis not present

## 2014-01-09 DIAGNOSIS — N182 Chronic kidney disease, stage 2 (mild): Secondary | ICD-10-CM | POA: Diagnosis not present

## 2014-01-09 DIAGNOSIS — I129 Hypertensive chronic kidney disease with stage 1 through stage 4 chronic kidney disease, or unspecified chronic kidney disease: Secondary | ICD-10-CM | POA: Diagnosis not present

## 2014-01-09 DIAGNOSIS — D631 Anemia in chronic kidney disease: Secondary | ICD-10-CM | POA: Diagnosis not present

## 2014-01-10 ENCOUNTER — Telehealth: Payer: Self-pay | Admitting: *Deleted

## 2014-01-10 ENCOUNTER — Other Ambulatory Visit (HOSPITAL_BASED_OUTPATIENT_CLINIC_OR_DEPARTMENT_OTHER): Payer: Medicare Other

## 2014-01-10 ENCOUNTER — Encounter: Payer: Self-pay | Admitting: Nurse Practitioner

## 2014-01-10 ENCOUNTER — Telehealth: Payer: Self-pay | Admitting: Nurse Practitioner

## 2014-01-10 ENCOUNTER — Ambulatory Visit (HOSPITAL_BASED_OUTPATIENT_CLINIC_OR_DEPARTMENT_OTHER): Payer: Medicare Other | Admitting: Nurse Practitioner

## 2014-01-10 VITALS — BP 136/63 | HR 69 | Temp 98.4°F | Resp 18 | Ht 65.5 in | Wt 180.9 lb

## 2014-01-10 DIAGNOSIS — C50912 Malignant neoplasm of unspecified site of left female breast: Secondary | ICD-10-CM

## 2014-01-10 DIAGNOSIS — Z170 Estrogen receptor positive status [ER+]: Secondary | ICD-10-CM | POA: Diagnosis not present

## 2014-01-10 DIAGNOSIS — M810 Age-related osteoporosis without current pathological fracture: Secondary | ICD-10-CM

## 2014-01-10 DIAGNOSIS — R609 Edema, unspecified: Secondary | ICD-10-CM | POA: Diagnosis not present

## 2014-01-10 DIAGNOSIS — C50919 Malignant neoplasm of unspecified site of unspecified female breast: Secondary | ICD-10-CM

## 2014-01-10 DIAGNOSIS — C50412 Malignant neoplasm of upper-outer quadrant of left female breast: Secondary | ICD-10-CM

## 2014-01-10 LAB — CBC WITH DIFFERENTIAL/PLATELET
BASO%: 0.9 % (ref 0.0–2.0)
BASOS ABS: 0.1 10*3/uL (ref 0.0–0.1)
EOS ABS: 0.3 10*3/uL (ref 0.0–0.5)
EOS%: 2.9 % (ref 0.0–7.0)
HEMATOCRIT: 37.5 % (ref 34.8–46.6)
HGB: 12.1 g/dL (ref 11.6–15.9)
LYMPH%: 20.3 % (ref 14.0–49.7)
MCH: 28.1 pg (ref 25.1–34.0)
MCHC: 32.2 g/dL (ref 31.5–36.0)
MCV: 87.2 fL (ref 79.5–101.0)
MONO#: 0.8 10*3/uL (ref 0.1–0.9)
MONO%: 9.3 % (ref 0.0–14.0)
NEUT%: 66.6 % (ref 38.4–76.8)
NEUTROS ABS: 5.9 10*3/uL (ref 1.5–6.5)
Platelets: 247 10*3/uL (ref 145–400)
RBC: 4.3 10*6/uL (ref 3.70–5.45)
RDW: 16.8 % — ABNORMAL HIGH (ref 11.2–14.5)
WBC: 8.9 10*3/uL (ref 3.9–10.3)
lymph#: 1.8 10*3/uL (ref 0.9–3.3)

## 2014-01-10 LAB — COMPREHENSIVE METABOLIC PANEL (CC13)
ALT: 7 U/L (ref 0–55)
ANION GAP: 8 meq/L (ref 3–11)
AST: 13 U/L (ref 5–34)
Albumin: 3.4 g/dL — ABNORMAL LOW (ref 3.5–5.0)
Alkaline Phosphatase: 82 U/L (ref 40–150)
BILIRUBIN TOTAL: 0.41 mg/dL (ref 0.20–1.20)
BUN: 10.6 mg/dL (ref 7.0–26.0)
CO2: 24 meq/L (ref 22–29)
Calcium: 9.9 mg/dL (ref 8.4–10.4)
Chloride: 107 mEq/L (ref 98–109)
Creatinine: 0.8 mg/dL (ref 0.6–1.1)
GLUCOSE: 133 mg/dL (ref 70–140)
Potassium: 4.3 mEq/L (ref 3.5–5.1)
SODIUM: 139 meq/L (ref 136–145)
Total Protein: 7.3 g/dL (ref 6.4–8.3)

## 2014-01-10 NOTE — Telephone Encounter (Signed)
, °

## 2014-01-10 NOTE — Telephone Encounter (Signed)
Per staff message and POF I have scheduled appts. Advised scheduler of appts. JMW  

## 2014-01-10 NOTE — Progress Notes (Signed)
ID: [name]   DOB: 16-Sep-1931  MR#: [id number]  PPI#:[id number]  PCP: Olga Millers, MD GYN: SU:  OTHER MD: Saralyn Pilar right  CHIEF COMPLAINT: left breast cancer  CURRENT TREATMENT: letrozole daily  BREAST CANCER HISTORY: [name] had screening mammography September 29th 2011 which showed possible masses in both breasts (the prior mammogram that I have is from October of 2008).  She was recalled for further views on October 11th.  In the right breast, there was an oval circumscribed nodule in the anterior lower outer quadrant which by ultrasound was felt possibly to represent either an intraductal lesion or debris in a dilated cyst.  In the left breast, Dr. Sadie Haber found a small spiculated mass at 2 o'clock in the subareolar region which by ultrasound was hypoechoic and measured 1 cm.  The left axilla showed normal axillary lymph node anatomy.  With this information, after appropriate discussion on the same day, Dr. Sadie Haber proceeded to aspiration of the right breast cyst which histologically proved to be benign adipose tissue.  Biopsy of the left breast, however ([id number]) showed an invasive ductal carcinoma which was 100% ER positive, 0% PR positive with a low proliferation marker at 7% and no Her-2 amplification with a ratio by CISH of 1.21.    With this information, the patient was referred to Dr. Zella Richer.  Bilateral breast MRIs could not be obtained because the patient has a pacemaker in place.  So she proceeded to [name] localization lumpectomy of the left breast mass February 03, 2010.  The pathology showed [phone]) a 1.2 cm invasive ductal carcinoma, grade 2, with negative margins and only focal lymphovascular invasion.  Her-2 was repeated on this tissue and was again negative.  Her subsequent history is as detailed below.  INTERVAL HISTORY: [name] returns today for follow up of her breast cancer. She has been on Letrozole since December 2011 and is tolerating it  well with no side effect that she is aware of. Specifically, she denies hot flashes, vaginal changes, or joint aches. The interval history is remarkable for a right knee replacement on 10/16/13. She has completed physical therapy and misses it dearly. She is currently mobile with a walker.   REVIEW OF SYSTEMS: [name] denies fevers, chills, nausea, or vomiting. She is occasionally constipated and uses senokot to manage this with good results. She denies shortness of breath, chest pain, cough, palpitations, or fatigue. She wears 2LO2 for sleep only. She has no headaches, dizziness, or unexplained weight loss. She has bilateral pedal edema and is currently on torsemide daily. A detailed review of systems is otherwise noncontributory.   PAST MEDICAL HISTORY: Past Medical History  Diagnosis Date  . Sleep apnea     associated with hypersomnia  . Amiodarone pulmonary toxicity   . Chronic renal insufficiency, stage III (moderate)     CrCl about 60 ml/min  . Diabetes   . Tobacco abuse   . Diastolic heart failure     Acute on Chronic  . Pacemaker     Permanent  . AF (atrial fibrillation)      AV ablation 9/09 [place] per Dr Ola Spurr - AV node ablation 9/11 Dr Caryl Comes  . Hyperlipidemia   . GERD (gastroesophageal reflux disease)   . [name] (coronary artery disease)     (not sure of this 11/10  . COPD (chronic obstructive pulmonary disease)     emphysema -FeV1 73% DLCO 53% 5/09  . Invasive ductal carcinoma of breast 2011    LEFT   .  OA (osteoarthritis) of knee     RIGHT  . Right sided sciatica   . Sinoatrial node dysfunction   . CHF (congestive heart failure)   . Mental disorder   . Acute blood loss anemia 04/20/2012  . Depression 06/06/2012  . Hx of cardiovascular stress test     Lexiscan Myoview (09/2013):  No definite ischemia, EF 67%; low risk  . Complication of anesthesia     "psycotic episode" after hip surg - resolved  . PAD (peripheral artery disease)     w/hx right iliac/SFA stenting  and left and rt leg PTA  . CVA (cerebral vascular accident)     no residual effects evident to pt   . History of skin cancer   . Eczema     PAST SURGICAL HISTORY: Past Surgical History  Procedure Laterality Date  . Mastectomy, partial  02/03/2010    Left/Dr Rosenbower  . Cataract extraction, bilateral      with IOL/Dr Groat  . Breast lumpectomy      left breast  . Orif acetabular fracture Left 04/19/2012    Procedure: OPEN REDUCTION INTERNAL FIXATION (ORIF) ACETABULAR FRACTURE;  Surgeon: Rozanna Box, MD;  Location: [place];  Service: Orthopedics;  Laterality: Left;  . Atrial ablation surgery      x 2 - "did not help - had to have pacemaker"  . Vascular surgery      both legs   . Cardiac catheterization    . Total knee arthroplasty Right 10/16/2013    Procedure: RIGHT TOTAL KNEE ARTHROPLASTY;  Surgeon: Gearlean Alf, MD;  Location: [facility];  Service: Orthopedics;  Laterality: Right;    FAMILY HISTORY Family History  Problem Relation Age of Onset  . Heart disease Father   The patient's father died from myocardial infarction at the age of 34.  The patient's mother died from unclear causes in the setting of COPD and ETOH abuse at the age of 7.  The patient had two sisters.  There is no history of breast or ovarian cancer in the family.   GYNECOLOGIC HISTORY: She is GX P4.  First pregnancy to term she believes was age 89.  The patient underwent the change of life in her late thirties.  She never took hormone therapy. She does not recall her age at menarche.    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  Her husband, Inocente Salles, was in industrial supplies.  Her son, Inocente Salles, is completing a PhD in Psychology . Daughter Enid Derry teaches kindergarten in the [place] area.  Daughter Zigmund Daniel is currently working for a Brewing technologist at [employer].  Son Clair Gulling lives in [place] and works at [employer]. The patient has 6 grandchildren, no  great grandchildren.  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     ADVANCED DIRECTIVES: in place  HEALTH MAINTENANCE: History  Substance Use Topics  . Smoking status: Former Smoker -- 1.00 packs/day for 55 years    Types: Cigarettes    Quit date: 12/19/2011  . Smokeless tobacco: Never Used     Comment: started smoking at age 72--4 cigs per day  . Alcohol Use: Yes     Comment: wine occasionally     Colonoscopy: due  PAP:  Bone density: T - 1.17 Mar 2010  Lipid panel:  Allergies  Allergen Reactions  . Cholestatin     RAGWEED SEASON  . Sulfur Itching  Current Outpatient Prescriptions  Medication Sig Dispense Refill  . apixaban (ELIQUIS) 5 MG TABS tablet Take 1 tablet (5 mg total) by mouth 2 (two) times daily. 180 tablet 3  . atenolol (TENORMIN) 25 MG tablet Take 25 mg by mouth every morning.     Marland Kitchen atorvastatin (LIPITOR) 20 MG tablet Take 20 mg by mouth every evening.    . cholecalciferol (VITAMIN D) 1000 UNITS tablet Take 1,000 Units by mouth daily.    . Cyanocobalamin (VITAMIN B-12 CR PO) Take 1 tablet by mouth daily.    . Fluticasone-Salmeterol (ADVAIR) 250-50 MCG/DOSE AEPB Inhale 1 puff into the lungs 2 (two) times daily. 3 each 1  . letrozole (FEMARA) 2.5 MG tablet TAKE 1 TABLET (2.5 MG TOTAL) BY MOUTH DAILY. 90 tablet 3  . magnesium oxide (MAG-OX) 400 MG tablet Take 400 mg by mouth daily.    . metFORMIN (GLUCOPHAGE) 500 MG tablet Take 1 tablet (500 mg total) by mouth 2 (two) times daily with a meal. 180 tablet 0  . mometasone (NASONEX) 50 MCG/ACT nasal spray Place 2 sprays into the nose daily as needed (allergies).     . potassium chloride SA (K-DUR,KLOR-CON) 20 MEQ tablet Take 40-60 mEq by mouth 2 (two) times daily. Take 3 tablets in morning and Take 2 tablets every evening    . senna-docusate (SENOKOT-S) 8.6-50 MG per tablet Take 1 tablet by mouth 2 (two) times daily. Stop for diarrhea or loose stools 60 tablet 0  . tiotropium (SPIRIVA  HANDIHALER) 18 MCG inhalation capsule Place 1 capsule (18 mcg total) into inhaler and inhale daily. 30 capsule 4  . torsemide (DEMADEX) 10 MG tablet Take 10 mg by mouth every morning.    Marland Kitchen acetaminophen (TYLENOL) 325 MG tablet Take 2 tablets (650 mg total) by mouth every 6 (six) hours as needed for mild pain (or Fever >/= 101). 40 tablet 0  . Calcium Carbonate-Vitamin D (CALCIUM + D PO) Take 1 tablet by mouth 3 (three) times a week.    . docusate sodium (COLACE) 100 MG capsule Take 100 mg by mouth 2 (two) times daily.    Marland Kitchen HYDROmorphone (DILAUDID) 2 MG tablet Take 1/2 tablet by mouth every 4 hours as needed for moderate pain; Take one tablet by mouth every 4 hours as needed for severe pain 180 tablet 0  . levalbuterol (XOPENEX HFA) 45 MCG/ACT inhaler Inhale 2 puffs into the lungs every 6 (six) hours as needed. For shortness of breath    . traMADol (ULTRAM) 50 MG tablet Take 1-2 tablets (50-100 mg total) by mouth every 6 (six) hours as needed for moderate pain. 60 tablet 1   No current facility-administered medications for this visit.    OBJECTIVE: Elderly white woman in no acute distress Filed Vitals:   01/10/14 1124  BP: 136/63  Pulse: 69  Temp: 98.4 F (36.9 C)  Resp: 18     Body mass index is 29.63 kg/(m^2).    ECOG FS: 1  Skin: warm, dry  HEENT: sclerae anicteric, conjunctivae pink, oropharynx clear. No thrush or mucositis. Upper and lower denture plates. Lymph Nodes: No cervical or supraclavicular lymphadenopathy  Lungs: clear to auscultation bilaterally, no rales, wheezes, or rhonci  Heart: regular rate and rhythm  Abdomen: round, soft, non tender, positive bowel sounds  Musculoskeletal: No focal spinal tenderness, no peripheral edema  Neuro: non focal, well oriented, positive affect  Breasts: left breast status post lumpectomy. No skin or nipple changes. No evidence of local recurrence. Left axilla  benign.  LAB RESULTS: Lab Results  Component Value Date   WBC 8.9 01/10/2014    NEUTROABS 5.9 01/10/2014   HGB 12.1 01/10/2014   HCT 37.5 01/10/2014   MCV 87.2 01/10/2014   PLT 247 01/10/2014      Chemistry      Component Value Date/Time   NA 139 01/10/2014 1105   NA 139 10/18/2013 0515   K 4.3 01/10/2014 1105   K 4.5 10/18/2013 0515   CL 103 10/18/2013 0515   CO2 24 01/10/2014 1105   CO2 21 10/18/2013 0515   BUN 10.6 01/10/2014 1105   BUN 18 10/18/2013 0515   BUN 18 07/14/2010 0922   CREATININE 0.8 01/10/2014 1105   CREATININE 0.85 10/18/2013 0515   CREATININE 1.0 07/14/2010 0922      Component Value Date/Time   CALCIUM 9.9 01/10/2014 1105   CALCIUM 9.3 10/18/2013 0515   CALCIUM 7.7* 04/21/2012 0330   ALKPHOS 82 01/10/2014 1105   ALKPHOS 81 10/09/2013 1130   AST 13 01/10/2014 1105   AST 23 10/09/2013 1130   ALT 7 01/10/2014 1105   ALT 12 10/09/2013 1130   BILITOT 0.41 01/10/2014 1105   BILITOT 0.3 10/09/2013 1130       Lab Results  Component Value Date   LABCA2 22 07/14/2011    No components found for: [id number]  No results for input(s): INR in the last 168 hours.  Urinalysis    Component Value Date/Time   COLORURINE YELLOW 10/09/2013 1136   APPEARANCEUR CLOUDY* 10/09/2013 1136   LABSPEC 1.019 10/09/2013 1136   PHURINE 7.0 10/09/2013 1136   GLUCOSEU NEGATIVE 10/09/2013 1136   HGBUR TRACE* 10/09/2013 1136   BILIRUBINUR NEGATIVE 10/09/2013 1136   KETONESUR NEGATIVE 10/09/2013 1136   PROTEINUR NEGATIVE 10/09/2013 1136   UROBILINOGEN 0.2 10/09/2013 1136   NITRITE NEGATIVE 10/09/2013 1136   LEUKOCYTESUR LARGE* 10/09/2013 1136    STUDIES: Most recent mammogram on 12/29/13 was unremarkable.   Most recent bone density scan on 12/29/13 showed a t-score of -2.8 at femoral neck, last measured at -1.9 on 04/05/10  ASSESSMENT: 80 y.o.  [name] woman   (1) status post left lumpectomy November 2011 for a pT1c cN0, stage I invasive ductal carcinoma, grade 2, strongly estrogen receptor positive, with a low proliferation fraction at  7%, progesterone receptor and HER-2 negative, on letrozole since December 2011 with excellent tolerance  (2) Osteoporosis - to begin zometa q 4mo X 4 starting November 2015  PLAN:  As far as her breast cancer is concerned, FEmmajeanis doing well. The labs were reviewed in detail and were stable. She is now 4 years out from her definitive surgery with no evidence of recurrent disease. She is tolerating the letrozole well and the plan is to continue this until next year to complete 5 total years of antiestrogen therapy.   Katharine's most recent bone density scan now shows osteoporosis, a significant change from her last scan in 2012. She is already on calcium and vitamin D supplements. I suggested she begin a bisphosphonate treatment to strengthen her bones.  I advised her of the possible risks including hypocalcemia and osteonecrosis of the jaw. She currently has full upper and lower denture plates and continues to have regular dental visits to have these adjusted.We discussed her options, and she agreed to start on zolendronate every 6 months for the next 2 years beginning this month.   Laurelin will return next year for labs, an office visit, and what will be her  3rd dose of zolendronate by that time. At this point she will be eligible to "graduate" from follow up visits. She understands and agrees with this plan. She knows the goal of treatment in her case is cure. She has been encouraged to call with any issues that might arise before her next visit here.   Marcelino Duster    01/10/2014

## 2014-01-11 NOTE — Telephone Encounter (Signed)
Received request from Nurse fax box, documents faxed for surgical clearance. To: [employer] Fax number: [phone] Attention: 7.31.15/S. March Rummage

## 2014-01-12 DIAGNOSIS — Z7901 Long term (current) use of anticoagulants: Secondary | ICD-10-CM | POA: Diagnosis not present

## 2014-01-12 DIAGNOSIS — S41102A Unspecified open wound of left upper arm, initial encounter: Secondary | ICD-10-CM | POA: Diagnosis not present

## 2014-01-14 ENCOUNTER — Encounter (HOSPITAL_COMMUNITY): Payer: Self-pay | Admitting: *Deleted

## 2014-01-14 ENCOUNTER — Emergency Department (INDEPENDENT_AMBULATORY_CARE_PROVIDER_SITE_OTHER)
Admission: EM | Admit: 2014-01-14 | Discharge: 2014-01-14 | Disposition: A | Discharge status: Home / Self Care | Payer: Medicare Other | Source: Home / Self Care | Attending: Family Medicine | Admitting: Family Medicine

## 2014-01-14 DIAGNOSIS — S40812A Abrasion of left upper arm, initial encounter: Secondary | ICD-10-CM | POA: Diagnosis not present

## 2014-01-14 DIAGNOSIS — Z5189 Encounter for other specified aftercare: Secondary | ICD-10-CM

## 2014-01-14 NOTE — ED Notes (Deleted)
Pt  Reports   She  Sustained  A  Laceration /     Avulsion  To  l  Arm   3  Days  Ago  She  Was seen   At  [facility] [place] /  [place]       She  Is  Here  Today   For  A  Wound  Check /  Dressing  Change

## 2014-01-14 NOTE — Discharge Instructions (Signed)
Keep clean and dry, return on tues for recheck

## 2014-01-14 NOTE — ED Notes (Signed)
Here  Today for  A   Wound  Check  Seen  3  Days  Ago  [name]  For an  Avulsion  /  Skin tear  To l   Arm     -  Pt  denys  Any other  Injury       She  Is  Awake  And  Alert

## 2014-01-14 NOTE — ED Provider Notes (Signed)
CSN: [id number]     Arrival date & time 01/14/14  1417 History   First MD Initiated Contact with Patient 01/14/14 1446     Chief Complaint  Patient presents with  . Wound Check   (Consider location/radiation/quality/duration/timing/severity/associated sxs/prior Treatment) Patient is a 80 y.o. female presenting with wound check. The history is provided by the patient.  Wound Check This is a new problem. The current episode started more than 2 days ago. The problem has been gradually improving. Associated symptoms comments: Skin avulsion to left upper arm, no problems..    Past Medical History  Diagnosis Date  . Sleep apnea     associated with hypersomnia  . Amiodarone pulmonary toxicity   . Chronic renal insufficiency, stage III (moderate)     CrCl about 60 ml/min  . Diabetes   . Tobacco abuse   . Diastolic heart failure     Acute on Chronic  . Pacemaker     Permanent  . AF (atrial fibrillation)      AV ablation 9/09 [place] per Dr Ola Spurr - AV node ablation 9/11 Dr Caryl Comes  . Hyperlipidemia   . GERD (gastroesophageal reflux disease)   . [name] (coronary artery disease)     (not sure of this 11/10  . COPD (chronic obstructive pulmonary disease)     emphysema -FeV1 73% DLCO 53% 5/09  . Invasive ductal carcinoma of breast 2011    LEFT   . OA (osteoarthritis) of knee     RIGHT  . Right sided sciatica   . Sinoatrial node dysfunction   . CHF (congestive heart failure)   . Mental disorder   . Acute blood loss anemia 04/20/2012  . Depression 06/06/2012  . Hx of cardiovascular stress test     Lexiscan Myoview (09/2013):  No definite ischemia, EF 67%; low risk  . Complication of anesthesia     "psycotic episode" after hip surg - resolved  . PAD (peripheral artery disease)     w/hx right iliac/SFA stenting and left and rt leg PTA  . CVA (cerebral vascular accident)     no residual effects evident to pt   . History of skin cancer   . Eczema    Past Surgical History  Procedure  Laterality Date  . Mastectomy, partial  02/03/2010    Left/Dr Rosenbower  . Cataract extraction, bilateral      with IOL/Dr Groat  . Breast lumpectomy      left breast  . Orif acetabular fracture Left 04/19/2012    Procedure: OPEN REDUCTION INTERNAL FIXATION (ORIF) ACETABULAR FRACTURE;  Surgeon: Rozanna Box, MD;  Location: [facility];  Service: Orthopedics;  Laterality: Left;  . Atrial ablation surgery      x 2 - "did not help - had to have pacemaker"  . Vascular surgery      both legs   . Cardiac catheterization    . Total knee arthroplasty Right 10/16/2013    Procedure: RIGHT TOTAL KNEE ARTHROPLASTY;  Surgeon: Gearlean Alf, MD;  Location: [facility];  Service: Orthopedics;  Laterality: Right;   Family History  Problem Relation Age of Onset  . Heart disease Father    History  Substance Use Topics  . Smoking status: Former Smoker -- 1.00 packs/day for 55 years    Types: Cigarettes    Quit date: 12/19/2011  . Smokeless tobacco: Never Used     Comment: started smoking at age 19--4 cigs per day  . Alcohol Use: Yes  Comment: wine occasionally   OB History    No data available     Review of Systems  Constitutional: Negative.   Musculoskeletal: Negative.   Skin: Positive for wound.    Allergies  Cholestatin and Sulfur  Home Medications   Prior to Admission medications   Medication Sig Start Date End Date Taking? Authorizing Provider  acetaminophen (TYLENOL) 325 MG tablet Take 2 tablets (650 mg total) by mouth every 6 (six) hours as needed for mild pain (or Fever >/= 101). 10/19/13   [name], PA-C  apixaban (ELIQUIS) 5 MG TABS tablet Take 1 tablet (5 mg total) by mouth 2 (two) times daily. 11/16/13   Olga Millers, MD  atenolol (TENORMIN) 25 MG tablet Take 25 mg by mouth every morning.     Historical Provider, MD  atorvastatin (LIPITOR) 20 MG tablet Take 20 mg by mouth every evening.    Historical Provider, MD  Calcium Carbonate-Vitamin D (CALCIUM + D PO) Take 1  tablet by mouth 3 (three) times a week.    Historical Provider, MD  cholecalciferol (VITAMIN D) 1000 UNITS tablet Take 1,000 Units by mouth daily.    Historical Provider, MD  Cyanocobalamin (VITAMIN B-12 CR PO) Take 1 tablet by mouth daily.    Historical Provider, MD  docusate sodium (COLACE) 100 MG capsule Take 100 mg by mouth 2 (two) times daily.    Historical Provider, MD  Fluticasone-Salmeterol (ADVAIR) 250-50 MCG/DOSE AEPB Inhale 1 puff into the lungs 2 (two) times daily. 10/04/13   Elsie Stain, MD  HYDROmorphone (DILAUDID) 2 MG tablet Take 1/2 tablet by mouth every 4 hours as needed for moderate pain; Take one tablet by mouth every 4 hours as needed for severe pain 10/20/13   Blanchie Serve, MD  letrozole (FEMARA) 2.5 MG tablet TAKE 1 TABLET (2.5 MG TOTAL) BY MOUTH DAILY. 11/22/13   Chauncey Cruel, MD  levalbuterol [facility] HFA) 45 MCG/ACT inhaler Inhale 2 puffs into the lungs every 6 (six) hours as needed. For shortness of breath    Historical Provider, MD  magnesium oxide (MAG-OX) 400 MG tablet Take 400 mg by mouth daily.    Historical Provider, MD  metFORMIN (GLUCOPHAGE) 500 MG tablet Take 1 tablet (500 mg total) by mouth 2 (two) times daily with a meal. 10/09/13   Hendricks Limes, MD  mometasone (NASONEX) 50 MCG/ACT nasal spray Place 2 sprays into the nose daily as needed (allergies).  06/28/13   Elsie Stain, MD  potassium chloride SA (K-DUR,KLOR-CON) 20 MEQ tablet Take 40-60 mEq by mouth 2 (two) times daily. Take 3 tablets in morning and Take 2 tablets every evening 01/16/13   Burnell Blanks, MD  senna-docusate (SENOKOT-S) 8.6-50 MG per tablet Take 1 tablet by mouth 2 (two) times daily. Stop for diarrhea or loose stools 11/22/13   Olga Millers, MD  tiotropium (SPIRIVA HANDIHALER) 18 MCG inhalation capsule Place 1 capsule (18 mcg total) into inhaler and inhale daily. 02/06/13   Elsie Stain, MD  torsemide (DEMADEX) 10 MG tablet Take 10 mg by mouth every morning.     Historical Provider, MD  traMADol (ULTRAM) 50 MG tablet Take 1-2 tablets (50-100 mg total) by mouth every 6 (six) hours as needed for moderate pain. 10/19/13   [name], PA-C   BP 137/71 mmHg  Pulse 77  Temp(Src) 97.9 F (36.6 C) (Oral)  Resp 18  SpO2 95% Physical Exam  Constitutional: She is oriented to person, place, and time. She  appears well-developed and well-nourished.  Neurological: She is alert and oriented to person, place, and time.  Skin: Skin is warm and dry.  3x3cm superficial skin avulsion to left upper arm, minor bleeding, no infection. dsg changed.  Nursing note and vitals reviewed.   ED Course  Procedures (including critical care time) Labs Review Labs Reviewed - No data to display  Imaging Review No results found.   MDM   1. Encounter for wound re-check    dsg changed    Billy Fischer, MD 01/14/14 1536

## 2014-01-16 ENCOUNTER — Encounter (HOSPITAL_COMMUNITY): Payer: Self-pay | Admitting: *Deleted

## 2014-01-16 ENCOUNTER — Emergency Department (INDEPENDENT_AMBULATORY_CARE_PROVIDER_SITE_OTHER)
Admission: EM | Admit: 2014-01-16 | Discharge: 2014-01-16 | Disposition: A | Discharge status: Home / Self Care | Payer: Medicare Other | Source: Home / Self Care | Attending: Family Medicine | Admitting: Family Medicine

## 2014-01-16 DIAGNOSIS — T148 Other injury of unspecified body region: Secondary | ICD-10-CM | POA: Diagnosis not present

## 2014-01-16 DIAGNOSIS — T148XXA Other injury of unspecified body region, initial encounter: Secondary | ICD-10-CM

## 2014-01-16 NOTE — ED Provider Notes (Signed)
CSN: [id number]     Arrival date & time 01/16/14  4235 History   First MD Initiated Contact with Patient 01/16/14 [phone]     Chief Complaint  Patient presents with  . Wound Check   (Consider location/radiation/quality/duration/timing/severity/associated sxs/prior Treatment) Patient is a 80 y.o. female presenting with wound check. The history is provided by the patient.  Wound Check This is a new problem. The current episode started more than 2 days ago (improving wound healing to left upper arm). The problem has been gradually improving.    Past Medical History  Diagnosis Date  . Sleep apnea     associated with hypersomnia  . Amiodarone pulmonary toxicity   . Chronic renal insufficiency, stage III (moderate)     CrCl about 60 ml/min  . Diabetes   . Tobacco abuse   . Diastolic heart failure     Acute on Chronic  . Pacemaker     Permanent  . AF (atrial fibrillation)      AV ablation 9/09 [facility] per Dr Ola Spurr - AV node ablation 9/11 Dr Caryl Comes  . Hyperlipidemia   . GERD (gastroesophageal reflux disease)   . [name] (coronary artery disease)     (not sure of this 11/10  . COPD (chronic obstructive pulmonary disease)     emphysema -FeV1 73% DLCO 53% 5/09  . Invasive ductal carcinoma of breast 2011    LEFT   . OA (osteoarthritis) of knee     RIGHT  . Right sided sciatica   . Sinoatrial node dysfunction   . CHF (congestive heart failure)   . Mental disorder   . Acute blood loss anemia 04/20/2012  . Depression 06/06/2012  . Hx of cardiovascular stress test     Lexiscan Myoview (09/2013):  No definite ischemia, EF 67%; low risk  . Complication of anesthesia     "psycotic episode" after hip surg - resolved  . PAD (peripheral artery disease)     w/hx right iliac/SFA stenting and left and rt leg PTA  . CVA (cerebral vascular accident)     no residual effects evident to pt   . History of skin cancer   . Eczema    Past Surgical History  Procedure Laterality Date  . Mastectomy,  partial  02/03/2010    Left/Dr Rosenbower  . Cataract extraction, bilateral      with IOL/Dr Groat  . Breast lumpectomy      left breast  . Orif acetabular fracture Left 04/19/2012    Procedure: OPEN REDUCTION INTERNAL FIXATION (ORIF) ACETABULAR FRACTURE;  Surgeon: Rozanna Box, MD;  Location: [facility];  Service: Orthopedics;  Laterality: Left;  . Atrial ablation surgery      x 2 - "did not help - had to have pacemaker"  . Vascular surgery      both legs   . Cardiac catheterization    . Total knee arthroplasty Right 10/16/2013    Procedure: RIGHT TOTAL KNEE ARTHROPLASTY;  Surgeon: Gearlean Alf, MD;  Location: [facility];  Service: Orthopedics;  Laterality: Right;   Family History  Problem Relation Age of Onset  . Heart disease Father    History  Substance Use Topics  . Smoking status: Former Smoker -- 1.00 packs/day for 55 years    Types: Cigarettes    Quit date: 12/19/2011  . Smokeless tobacco: Never Used     Comment: started smoking at age 72--4 cigs per day  . Alcohol Use: Yes     Comment: wine occasionally  OB History    No data available     Review of Systems  Constitutional: Negative.   Skin: Positive for wound. Negative for rash.    Allergies  Cholestatin and Sulfur  Home Medications   Prior to Admission medications   Medication Sig Start Date End Date Taking? Authorizing Provider  acetaminophen (TYLENOL) 325 MG tablet Take 2 tablets (650 mg total) by mouth every 6 (six) hours as needed for mild pain (or Fever >/= 101). 10/19/13   [name], PA-C  apixaban (ELIQUIS) 5 MG TABS tablet Take 1 tablet (5 mg total) by mouth 2 (two) times daily. 11/16/13   Olga Millers, MD  atenolol (TENORMIN) 25 MG tablet Take 25 mg by mouth every morning.     Historical Provider, MD  atorvastatin (LIPITOR) 20 MG tablet Take 20 mg by mouth every evening.    Historical Provider, MD  Calcium Carbonate-Vitamin D (CALCIUM + D PO) Take 1 tablet by mouth 3 (three) times a week.     Historical Provider, MD  cholecalciferol (VITAMIN D) 1000 UNITS tablet Take 1,000 Units by mouth daily.    Historical Provider, MD  Cyanocobalamin (VITAMIN B-12 CR PO) Take 1 tablet by mouth daily.    Historical Provider, MD  docusate sodium (COLACE) 100 MG capsule Take 100 mg by mouth 2 (two) times daily.    Historical Provider, MD  Fluticasone-Salmeterol (ADVAIR) 250-50 MCG/DOSE AEPB Inhale 1 puff into the lungs 2 (two) times daily. 10/04/13   Elsie Stain, MD  HYDROmorphone (DILAUDID) 2 MG tablet Take 1/2 tablet by mouth every 4 hours as needed for moderate pain; Take one tablet by mouth every 4 hours as needed for severe pain 10/20/13   Blanchie Serve, MD  letrozole (FEMARA) 2.5 MG tablet TAKE 1 TABLET (2.5 MG TOTAL) BY MOUTH DAILY. 11/22/13   Chauncey Cruel, MD  levalbuterol [facility] HFA) 45 MCG/ACT inhaler Inhale 2 puffs into the lungs every 6 (six) hours as needed. For shortness of breath    Historical Provider, MD  magnesium oxide (MAG-OX) 400 MG tablet Take 400 mg by mouth daily.    Historical Provider, MD  metFORMIN (GLUCOPHAGE) 500 MG tablet Take 1 tablet (500 mg total) by mouth 2 (two) times daily with a meal. 10/09/13   Hendricks Limes, MD  mometasone (NASONEX) 50 MCG/ACT nasal spray Place 2 sprays into the nose daily as needed (allergies).  06/28/13   Elsie Stain, MD  potassium chloride SA (K-DUR,KLOR-CON) 20 MEQ tablet Take 40-60 mEq by mouth 2 (two) times daily. Take 3 tablets in morning and Take 2 tablets every evening 01/16/13   Burnell Blanks, MD  senna-docusate (SENOKOT-S) 8.6-50 MG per tablet Take 1 tablet by mouth 2 (two) times daily. Stop for diarrhea or loose stools 11/22/13   Olga Millers, MD  tiotropium (SPIRIVA HANDIHALER) 18 MCG inhalation capsule Place 1 capsule (18 mcg total) into inhaler and inhale daily. 02/06/13   Elsie Stain, MD  torsemide (DEMADEX) 10 MG tablet Take 10 mg by mouth every morning.    Historical Provider, MD  traMADol (ULTRAM) 50  MG tablet Take 1-2 tablets (50-100 mg total) by mouth every 6 (six) hours as needed for moderate pain. 10/19/13   [name], PA-C   BP 124/75 mmHg  Pulse 70  Temp(Src) 97.5 F (36.4 C) (Oral)  Resp 16  SpO2 95% Physical Exam  Constitutional: She is oriented to person, place, and time. She appears well-developed and well-nourished.  Musculoskeletal:  She exhibits no tenderness.  Neurological: She is alert and oriented to person, place, and time.  Skin: Skin is warm and dry. No rash noted. No erythema.  Skin abrasion healing well, no infection, re-epithel  Nursing note and vitals reviewed.   ED Course  Procedures (including critical care time) Labs Review Labs Reviewed - No data to display  Imaging Review No results found.   MDM  No diagnosis found. Wound care given.    Billy Fischer, MD 01/16/14 [phone]

## 2014-01-16 NOTE — Discharge Instructions (Signed)
Care twice a day as discussed starting on wed. Return as needed.

## 2014-01-16 NOTE — ED Notes (Signed)
Pt is  Here for  A   Wound  Check         Seen  ucc  sev days ago         Wound    Appears  Healing

## 2014-01-17 ENCOUNTER — Other Ambulatory Visit (HOSPITAL_COMMUNITY): Payer: Self-pay | Admitting: *Deleted

## 2014-01-17 DIAGNOSIS — I6523 Occlusion and stenosis of bilateral carotid arteries: Secondary | ICD-10-CM

## 2014-01-19 ENCOUNTER — Ambulatory Visit (HOSPITAL_BASED_OUTPATIENT_CLINIC_OR_DEPARTMENT_OTHER): Payer: Medicare Other

## 2014-01-19 DIAGNOSIS — M810 Age-related osteoporosis without current pathological fracture: Secondary | ICD-10-CM | POA: Diagnosis not present

## 2014-01-19 MED ORDER — SODIUM CHLORIDE 0.9 % IV SOLN
Freq: Once | INTRAVENOUS | Status: AC
  Administered 2014-01-19: 14:00:00 via INTRAVENOUS

## 2014-01-19 MED ORDER — ZOLEDRONIC ACID 4 MG/100ML IV SOLN
4.0000 mg | Freq: Once | INTRAVENOUS | Status: AC
  Administered 2014-01-19: 4 mg via INTRAVENOUS
  Filled 2014-01-19: qty 100

## 2014-01-19 NOTE — Patient Instructions (Signed)

## 2014-01-29 ENCOUNTER — Encounter: Payer: Self-pay | Admitting: Oncology

## 2014-02-04 ENCOUNTER — Encounter (HOSPITAL_COMMUNITY): Payer: Self-pay | Admitting: Emergency Medicine

## 2014-02-04 ENCOUNTER — Emergency Department (INDEPENDENT_AMBULATORY_CARE_PROVIDER_SITE_OTHER)
Admission: EM | Admit: 2014-02-04 | Discharge: 2014-02-04 | Disposition: A | Discharge status: Home / Self Care | Payer: Medicare Other | Source: Home / Self Care | Attending: Family Medicine | Admitting: Family Medicine

## 2014-02-04 DIAGNOSIS — S51012A Laceration without foreign body of left elbow, initial encounter: Secondary | ICD-10-CM

## 2014-02-04 DIAGNOSIS — W1809XA Striking against other object with subsequent fall, initial encounter: Secondary | ICD-10-CM

## 2014-02-04 NOTE — ED Notes (Signed)
Pt states that she fell trying to pick up her grandchild and hit her arm on the table. Pt is in no acute distress at this time.

## 2014-02-04 NOTE — ED Provider Notes (Signed)
CSN: [id number]     Arrival date & time 02/04/14  1508 History   First MD Initiated Contact with Patient 02/04/14 1522     Chief Complaint  Patient presents with  . Extremity Laceration   (Consider location/radiation/quality/duration/timing/severity/associated sxs/prior Treatment) HPI Comments: Pt stumbled while lifting baby and struck her left elbow on a table corner producing a skin tear . Denies other injury. No neck , head or spinal pain . No other injuries Reported.   Past Medical History  Diagnosis Date  . Sleep apnea     associated with hypersomnia  . Amiodarone pulmonary toxicity   . Chronic renal insufficiency, stage III (moderate)     CrCl about 60 ml/min  . Diabetes   . Tobacco abuse   . Diastolic heart failure     Acute on Chronic  . Pacemaker     Permanent  . AF (atrial fibrillation)      AV ablation 9/09 [facility] per Dr Ola Spurr - AV node ablation 9/11 Dr Caryl Comes  . Hyperlipidemia   . GERD (gastroesophageal reflux disease)   . [name] (coronary artery disease)     (not sure of this 11/10  . COPD (chronic obstructive pulmonary disease)     emphysema -FeV1 73% DLCO 53% 5/09  . Invasive ductal carcinoma of breast 2011    LEFT   . OA (osteoarthritis) of knee     RIGHT  . Right sided sciatica   . Sinoatrial node dysfunction   . CHF (congestive heart failure)   . Mental disorder   . Acute blood loss anemia 04/20/2012  . Depression 06/06/2012  . Hx of cardiovascular stress test     Lexiscan Myoview (09/2013):  No definite ischemia, EF 67%; low risk  . Complication of anesthesia     "psycotic episode" after hip surg - resolved  . PAD (peripheral artery disease)     w/hx right iliac/SFA stenting and left and rt leg PTA  . CVA (cerebral vascular accident)     no residual effects evident to pt   . History of skin cancer   . Eczema    Past Surgical History  Procedure Laterality Date  . Mastectomy, partial  02/03/2010    Left/Dr Rosenbower  . Cataract extraction,  bilateral      with IOL/Dr Groat  . Breast lumpectomy      left breast  . Orif acetabular fracture Left 04/19/2012    Procedure: OPEN REDUCTION INTERNAL FIXATION (ORIF) ACETABULAR FRACTURE;  Surgeon: Rozanna Box, MD;  Location: [facility];  Service: Orthopedics;  Laterality: Left;  . Atrial ablation surgery      x 2 - "did not help - had to have pacemaker"  . Vascular surgery      both legs   . Cardiac catheterization    . Total knee arthroplasty Right 10/16/2013    Procedure: RIGHT TOTAL KNEE ARTHROPLASTY;  Surgeon: Gearlean Alf, MD;  Location: [facility];  Service: Orthopedics;  Laterality: Right;   Family History  Problem Relation Age of Onset  . Heart disease Father    History  Substance Use Topics  . Smoking status: Former Smoker -- 1.00 packs/day for 55 years    Types: Cigarettes    Quit date: 12/19/2011  . Smokeless tobacco: Never Used     Comment: started smoking at age 84--4 cigs per day  . Alcohol Use: Yes     Comment: wine occasionally   OB History    No data available  Review of Systems  Constitutional: Negative.   HENT: Negative.   Respiratory: Negative for shortness of breath.   Cardiovascular: Negative for chest pain.  Musculoskeletal: Negative for back pain, neck pain and neck stiffness.  Skin: Positive for wound.  Neurological: Negative for dizziness, tremors, syncope, light-headedness and headaches.  Psychiatric/Behavioral: Negative.     Allergies  Cholestatin and Sulfur  Home Medications   Prior to Admission medications   Medication Sig Start Date End Date Taking? Authorizing Provider  acetaminophen (TYLENOL) 325 MG tablet Take 2 tablets (650 mg total) by mouth every 6 (six) hours as needed for mild pain (or Fever >/= 101). 10/19/13   [name], PA-C  apixaban (ELIQUIS) 5 MG TABS tablet Take 1 tablet (5 mg total) by mouth 2 (two) times daily. 11/16/13   Olga Millers, MD  atenolol (TENORMIN) 25 MG tablet Take 25 mg by mouth every morning.      Historical Provider, MD  atorvastatin (LIPITOR) 20 MG tablet Take 20 mg by mouth every evening.    Historical Provider, MD  Calcium Carbonate-Vitamin D (CALCIUM + D PO) Take 1 tablet by mouth 3 (three) times a week.    Historical Provider, MD  cholecalciferol (VITAMIN D) 1000 UNITS tablet Take 1,000 Units by mouth daily.    Historical Provider, MD  Cyanocobalamin (VITAMIN B-12 CR PO) Take 1 tablet by mouth daily.    Historical Provider, MD  docusate sodium (COLACE) 100 MG capsule Take 100 mg by mouth 2 (two) times daily.    Historical Provider, MD  Fluticasone-Salmeterol (ADVAIR) 250-50 MCG/DOSE AEPB Inhale 1 puff into the lungs 2 (two) times daily. 10/04/13   Elsie Stain, MD  HYDROmorphone (DILAUDID) 2 MG tablet Take 1/2 tablet by mouth every 4 hours as needed for moderate pain; Take one tablet by mouth every 4 hours as needed for severe pain 10/20/13   Blanchie Serve, MD  letrozole (FEMARA) 2.5 MG tablet TAKE 1 TABLET (2.5 MG TOTAL) BY MOUTH DAILY. 11/22/13   Chauncey Cruel, MD  levalbuterol [facility] HFA) 45 MCG/ACT inhaler Inhale 2 puffs into the lungs every 6 (six) hours as needed. For shortness of breath    Historical Provider, MD  magnesium oxide (MAG-OX) 400 MG tablet Take 400 mg by mouth daily.    Historical Provider, MD  metFORMIN (GLUCOPHAGE) 500 MG tablet Take 1 tablet (500 mg total) by mouth 2 (two) times daily with a meal. 10/09/13   Hendricks Limes, MD  mometasone (NASONEX) 50 MCG/ACT nasal spray Place 2 sprays into the nose daily as needed (allergies).  06/28/13   Elsie Stain, MD  potassium chloride SA (K-DUR,KLOR-CON) 20 MEQ tablet Take 40-60 mEq by mouth 2 (two) times daily. Take 3 tablets in morning and Take 2 tablets every evening 01/16/13   Burnell Blanks, MD  senna-docusate (SENOKOT-S) 8.6-50 MG per tablet Take 1 tablet by mouth 2 (two) times daily. Stop for diarrhea or loose stools 11/22/13   Olga Millers, MD  tiotropium (SPIRIVA HANDIHALER) 18 MCG inhalation  capsule Place 1 capsule (18 mcg total) into inhaler and inhale daily. 02/06/13   Elsie Stain, MD  torsemide (DEMADEX) 10 MG tablet Take 10 mg by mouth every morning.    Historical Provider, MD  traMADol (ULTRAM) 50 MG tablet Take 1-2 tablets (50-100 mg total) by mouth every 6 (six) hours as needed for moderate pain. 10/19/13   [name], PA-C   BP 140/52 mmHg  Pulse 70  Temp(Src) 97.4 F (  36.3 C) (Oral)  Resp 18  SpO2 97% Physical Exam  Constitutional: She is oriented to person, place, and time. She appears well-developed and well-nourished. No distress.  Eyes: EOM are normal.  Neck: Normal range of motion. Neck supple.  Pulmonary/Chest: Effort normal. No respiratory distress.  Lymphadenopathy:    She has no cervical adenopathy.  Neurological: She is alert and oriented to person, place, and time. No cranial nerve deficit. She exhibits normal muscle tone.  Skin: Skin is warm and dry.  3 cm curvilinear skin tear, superficial to posterior left elbow. Minimal bleeding, well controlled. Full ROM of elbow and LUE. Distal N/V, M/S intact.  Psychiatric: She has a normal mood and affect.  Nursing note and vitals reviewed.   ED Course  LACERATION REPAIR Date/Time: 02/04/2014 3:52 PM Performed by: Marcha Dutton, Demetri Goshert Authorized by: Ihor Gully D Consent: Verbal consent obtained. Risks and benefits: risks, benefits and alternatives were discussed Consent given by: patient Patient understanding: patient states understanding of the procedure being performed Patient identity confirmed: verbally with patient Body area: upper extremity Location details: left elbow Laceration length: 3 cm Foreign bodies: no foreign bodies Tendon involvement: none Nerve involvement: none Vascular damage: no Patient sedated: no Irrigation solution: saline Irrigation method: syringe Amount of cleaning: standard Debridement: none Degree of undermining: none Skin closure: Steri-Strips Technique:  simple Approximation: loose Approximation difficulty: simple Comments: nonadhesive dressing   (including critical care time) Labs Review Labs Reviewed - No data to display  Imaging Review No results found.   MDM   1. Skin tear of elbow without complication, left, initial encounter   2. Fall against object, initial encounter    Return fo problems Keep strips on, will fall off with time For infection return promptly.    Janne Napoleon, NP 02/04/14 1555

## 2014-02-04 NOTE — Discharge Instructions (Signed)
Stitches, Staples, or Skin Adhesive Strips  Stitches (sutures), staples, and skin adhesive strips hold the skin together as it heals. They will usually be in place for 7 days or less. HOME CARE  Wash your hands with soap and water before and after you touch your wound.  Only take medicine as told by your doctor.  Cover your wound only if your doctor told you to. Otherwise, leave it open to air.  Do not get your steristrips wet or dirty. . Do not rub. Pat them dry gently.  Do not put medicine or medicated cream on your stitches unless your doctor told you to.  Do not take out your own stitches or staples. Skin adhesive strips will fall off by themselves.  Do not pick at the wound. Picking can cause an infection.  Do not miss your follow-up appointment.  If you have problems or questions, call your doctor. GET HELP RIGHT AWAY IF:   You have a temperature by mouth above 102 F (38.9 C), not controlled by medicine.  You have chills.  You have redness or pain around your stitches.  There is puffiness (swelling) around your stitches.  You notice fluid (drainage) from your stitches.  There is a bad smell coming from your wound. MAKE SURE YOU:  Understand these instructions.  Will watch your condition.  Will get help if you are not doing well or get worse. Document Released: 12/21/2008 Document Revised: 05/18/2011 Document Reviewed: 12/21/2008 [facility] Patient Information 2015 [place], [place]. This information is not intended to replace advice given to you by your health care provider. Make sure you discuss any questions you have with your health care provider.

## 2014-02-06 ENCOUNTER — Other Ambulatory Visit (INDEPENDENT_AMBULATORY_CARE_PROVIDER_SITE_OTHER): Payer: Medicare Other

## 2014-02-06 ENCOUNTER — Encounter: Payer: Self-pay | Admitting: Internal Medicine

## 2014-02-06 ENCOUNTER — Ambulatory Visit (INDEPENDENT_AMBULATORY_CARE_PROVIDER_SITE_OTHER): Payer: Medicare Other | Admitting: Internal Medicine

## 2014-02-06 VITALS — BP 120/80 | HR 81 | Temp 97.4°F | Ht 64.0 in | Wt 176.0 lb

## 2014-02-06 DIAGNOSIS — E119 Type 2 diabetes mellitus without complications: Secondary | ICD-10-CM

## 2014-02-06 DIAGNOSIS — E118 Type 2 diabetes mellitus with unspecified complications: Secondary | ICD-10-CM | POA: Diagnosis not present

## 2014-02-06 DIAGNOSIS — I2510 Atherosclerotic heart disease of native coronary artery without angina pectoris: Secondary | ICD-10-CM | POA: Diagnosis not present

## 2014-02-06 LAB — HEMOGLOBIN A1C: Hgb A1c MFr Bld: 7.3 % — ABNORMAL HIGH (ref 4.6–6.5)

## 2014-02-06 MED ORDER — SENNOSIDES-DOCUSATE SODIUM 8.6-50 MG PO TABS: 1.0000 | ORAL_TABLET | Freq: Two times a day (BID) | ORAL | Status: DC

## 2014-02-06 MED ORDER — HYDROCORTISONE 1 % EX CREA: 1.0000 "application " | TOPICAL_CREAM | Freq: Two times a day (BID) | CUTANEOUS | Status: DC

## 2014-02-06 NOTE — Progress Notes (Signed)
   Subjective:    Patient ID: [name], female    DOB: 16-Feb-1932, 80 y.o.   MRN: [id number]  HPI the patient is an 80 year old female who comes in today to follow-up on her knee rehabilitation as well as her diabetes. She was also seen recently in the emergency room for fall with skin tear. She is still having some difficulty with her knee and is concerned that it's not progressing as appropriately. She is still doing physical therapy however is back in her house. She states that her most recent fall her husband was trying to help her down the stairs and put her off balance. She denies tripping on anything and otherwise does well.  Review of Systems  Constitutional: Positive for activity change. Negative for fever, chills, appetite change, fatigue and unexpected weight change.  HENT: Negative for congestion and rhinorrhea.   Respiratory: Negative for cough, chest tightness, shortness of breath, wheezing and stridor.   Cardiovascular: Negative for chest pain and palpitations.  Gastrointestinal: Negative for nausea, vomiting, diarrhea and abdominal distention.  Musculoskeletal: Positive for arthralgias and gait problem.  Skin: Positive for wound.       Healing wound on right knee s/p knee replacement. Appears to be well healing and no signs of infection or erythema or pus.  Neurological: Negative for dizziness, tremors, syncope, weakness, numbness and headaches.     Objective:   Physical Exam  Constitutional: She is oriented to person, place, and time. She appears well-developed and well-nourished. No distress.  HENT:  Head: Normocephalic and atraumatic.  Eyes: EOM are normal. Pupils are equal, round, and reactive to light.  Neck: Normal range of motion. Neck supple. No JVD present.  Cardiovascular: Normal rate.   Paced  Pulmonary/Chest: Effort normal and breath sounds normal. No respiratory distress. She has no wheezes. She has no rales. She exhibits no tenderness.  Abdominal:  Soft. Bowel sounds are normal. She exhibits no distension. There is no tenderness. There is no rebound and no guarding.  Neurological: She is alert and oriented to person, place, and time. No cranial nerve deficit.  Skin: She is not diaphoretic.  Skin tear on left forearm covered with bandage. Bruise on left hand.   Filed Vitals:   02/06/14 1311  BP: 120/80  Pulse: 81  Temp: 97.4 F (36.3 C)  TempSrc: Oral  Height: 5\' 4"  (1.626 m)  Weight: 176 lb (79.833 kg)  SpO2: 93%      Assessment & Plan:

## 2014-02-06 NOTE — Patient Instructions (Addendum)
I have given you information about the skin condition on your face. I have also sent in a cream to her pharmacy that you can use once to 2 times daily to help that go away.  We will check on your diabetes today with blood work and then see you back in about 6 months.  Seborrheic Dermatitis Seborrheic dermatitis involves pink or red skin with greasy, flaky scales. This is often found on the scalp, eyebrows, nose, bearded area, and on or behind the ears. It can also occur on the central chest. It often occurs where there are more oil (sebaceous) glands. This condition is also known as dandruff. When this condition affects a baby's scalp, it is called cradle cap. It may come and go for no known reason. It can occur at any time of life from infancy to old age. CAUSES  The cause is unknown. It is not the result of too little moisture or too much oil. In some people, seborrheic dermatitis flare-ups seem to be triggered by stress. It also commonly occurs in people with certain diseases such as Parkinson's disease or HIV/AIDS. SYMPTOMS   Thick scales on the scalp.  Redness on the face or in the armpits.  The skin may seem oily or dry, but moisturizers do not help.  In infants, seborrheic dermatitis appears as scaly redness that does not seem to bother the baby. In some babies, it affects only the scalp. In others, it also affects the neck creases, armpits, groin, or behind the ears.  In adults and adolescents, seborrheic dermatitis may affect only the scalp. It may look patchy or spread out, with areas of redness and flaking. Other areas commonly affected include:  Eyebrows.  Eyelids.  Forehead.  Skin behind the ears.  Outer ears.  Chest.  Armpits.  Nose creases.  Skin creases under the breasts.  Skin between the buttocks.  Groin.  Some adults and adolescents feel itching or burning in the affected areas. DIAGNOSIS  Your caregiver can usually tell what the problem is by doing a  physical exam. TREATMENT   Cortisone (steroid) ointments, creams, and lotions can help decrease inflammation.  Babies can be treated with baby oil to soften the scales, then they may be washed with baby shampoo. If this does not help, a prescription topical steroid medicine may work.  Adults can use medicated shampoos.  Your caregiver may prescribe corticosteroid cream and shampoo containing an antifungal or yeast medicine (ketoconazole). Hydrocortisone or anti-yeast cream can be rubbed directly onto seborrheic dermatitis patches. Yeast does not cause seborrheic dermatitis, but it seems to add to the problem. In infants, seborrheic dermatitis is often worst during the first year of life. It tends to disappear on its own as the child grows. However, it may return during the teenage years. In adults and adolescents, seborrheic dermatitis tends to be a long-lasting condition that comes and goes over many years. HOME CARE INSTRUCTIONS   Use prescribed medicines as directed.  In infants, do not aggressively remove the scales or flakes on the scalp with a comb or by other means. This may lead to hair loss. SEEK MEDICAL CARE IF:   The problem does not improve from the medicated shampoos, lotions, or other medicines given by your caregiver.  You have any other questions or concerns. Document Released: 02/23/2005 Document Revised: 08/25/2011 Document Reviewed: 07/15/2009 [facility] Patient Information 2015 [place], [place]. This information is not intended to replace advice given to you by your health care provider. Make sure  you discuss any questions you have with your health care provider.

## 2014-02-06 NOTE — Progress Notes (Signed)
Pre visit review using our clinic review tool, if applicable. No additional management support is needed unless otherwise documented below in the visit note. 

## 2014-02-07 NOTE — Assessment & Plan Note (Signed)
Check hemoglobin A1c. Continue metformin. Foot exam performed.

## 2014-02-14 ENCOUNTER — Other Ambulatory Visit: Payer: Self-pay | Admitting: *Deleted

## 2014-02-14 MED ORDER — POTASSIUM CHLORIDE CRYS ER 20 MEQ PO TBCR: EXTENDED_RELEASE_TABLET | ORAL | Status: DC

## 2014-02-14 MED ORDER — ATENOLOL 25 MG PO TABS: 25.0000 mg | ORAL_TABLET | Freq: Every morning | ORAL | Status: DC

## 2014-02-14 NOTE — Telephone Encounter (Signed)
Received fax from [facility] requesting refill, however sig was different then what was in patient's chart. I called the patient and verified the directions with her.

## 2014-02-14 NOTE — Telephone Encounter (Signed)
Left msg on triage needing her atenolol sent to [facility]. Called pt back no answer x's 10 rings. Med has sent to mail service...Johny Chess

## 2014-02-22 ENCOUNTER — Other Ambulatory Visit: Payer: Self-pay | Admitting: Critical Care Medicine

## 2014-02-22 ENCOUNTER — Other Ambulatory Visit: Payer: Self-pay | Admitting: Internal Medicine

## 2014-03-15 ENCOUNTER — Encounter: Payer: Self-pay | Admitting: Internal Medicine

## 2014-03-15 ENCOUNTER — Ambulatory Visit (INDEPENDENT_AMBULATORY_CARE_PROVIDER_SITE_OTHER): Payer: Medicare Other | Admitting: Internal Medicine

## 2014-03-15 VITALS — BP 112/64 | HR 96 | Temp 98.1°F | Resp 20 | Wt 178.4 lb

## 2014-03-15 DIAGNOSIS — I6523 Occlusion and stenosis of bilateral carotid arteries: Secondary | ICD-10-CM

## 2014-03-15 DIAGNOSIS — J302 Other seasonal allergic rhinitis: Secondary | ICD-10-CM

## 2014-03-15 NOTE — Progress Notes (Signed)
Pre visit review using our clinic review tool, if applicable. No additional management support is needed unless otherwise documented below in the visit note. 

## 2014-03-15 NOTE — Patient Instructions (Signed)
Start taking the nasonex that you have at home. Use 2 puffs in each nostril once a day. The other thing you can do is use vaseline on the rim of each nostril at night time to keep the nose moisturized and prevent it from bleeding.   If you have fevers or chills or start to get problems breathing or bad cough please call us back.

## 2014-03-16 ENCOUNTER — Encounter: Payer: Self-pay | Admitting: Internal Medicine

## 2014-03-16 ENCOUNTER — Ambulatory Visit (INDEPENDENT_AMBULATORY_CARE_PROVIDER_SITE_OTHER): Payer: Medicare Other | Admitting: Cardiovascular Disease

## 2014-03-16 ENCOUNTER — Encounter: Payer: Self-pay | Admitting: Cardiovascular Disease

## 2014-03-16 ENCOUNTER — Ambulatory Visit (HOSPITAL_COMMUNITY): Payer: Medicare Other | Attending: Cardiovascular Disease | Admitting: Cardiology

## 2014-03-16 VITALS — BP 102/60 | HR 70 | Ht 65.0 in | Wt 177.8 lb

## 2014-03-16 DIAGNOSIS — I2510 Atherosclerotic heart disease of native coronary artery without angina pectoris: Secondary | ICD-10-CM

## 2014-03-16 DIAGNOSIS — I6523 Occlusion and stenosis of bilateral carotid arteries: Secondary | ICD-10-CM | POA: Diagnosis not present

## 2014-03-16 NOTE — Progress Notes (Signed)
   Subjective:    Patient ID: [name], female    DOB: Nov 03, 1931, 81 y.o.   MRN: [id number]  HPI The patient is an 81 YO female who is coming in for sinus drainage. She does not have facial tenderness or fevers or chills. Denies any SOB. Denies chest pains. Denies ear pain or drainage. She is blowing her nose several times per day and greenish snot. She is taking her medications but not taking any nose product.   Review of Systems  Constitutional: Negative for fever, chills, activity change, appetite change and fatigue.  HENT: Positive for congestion. Negative for ear discharge, ear pain, postnasal drip, rhinorrhea, sinus pressure and sore throat.   Respiratory: Negative for cough, chest tightness, shortness of breath and wheezing.   Cardiovascular: Negative for chest pain, palpitations and leg swelling.  Gastrointestinal: Negative.   Musculoskeletal: Negative.   Neurological: Negative.       Objective:   Physical Exam  Constitutional: She appears well-developed and well-nourished.  HENT:  Head: Normocephalic and atraumatic.  Right Ear: External ear normal.  Left Ear: External ear normal.  Mouth/Throat: Oropharynx is clear and moist.  Nose with some mild green crusting. No drainage in the oropharynx.  Eyes: EOM are normal.  Neck: Normal range of motion.  Cardiovascular: Normal rate and regular rhythm.   Pulmonary/Chest: Effort normal and breath sounds normal. No respiratory distress. She has no wheezes. She has no rales.  Abdominal: Soft. Bowel sounds are normal.  Musculoskeletal: She exhibits no edema.  Lymphadenopathy:    She has no cervical adenopathy.  Skin: Skin is warm and dry.   Filed Vitals:   03/15/14 1357  BP: 112/64  Pulse: 96  Temp: 98.1 F (36.7 C)  TempSrc: Oral  Resp: 20  Weight: 178 lb 6.4 oz (80.922 kg)  SpO2: 95%      Assessment & Plan:

## 2014-03-16 NOTE — Progress Notes (Signed)
Carotid duplex performed 

## 2014-03-16 NOTE — Assessment & Plan Note (Signed)
Advised to start the nasocort that she has at home. No indication for antibiotic at this time. Can use vaseline on the rim of nostrils to keep moisturized.

## 2014-03-16 NOTE — Progress Notes (Signed)
Background: the patient is followed for nonobstructive coronary artery disease, chronic diastolic heart failure, atrial fibrillation status post radiofrequency ablation 2 now with history of AV node ablation and permanent pacemaker, and lower extremity peripheral arterial disease with remote tibial angioplasty and right iliac and SFA stenting. Dr Caryl Comes is her primary cardiologist and I follow her for PAD.  She has a history of amiodarone pulmonary toxicity. Other medical problems include type 2 diabetes, obstructive sleep apnea, and prior stroke.  HPI:  81 year-old woman presents for follow-up evaluation. Overall she is doing ok from a vascular standpoint. Has knee pains but no claudication symptoms. Not doing much walking. She no longer drives and this has been really tough for. No chest pain or leg swelling. No stroke or TIA symptoms. She's had left knee replacement surgery since I've last seen her.   Studies:  - LHC (12/2002): Mid LAD 40%, prox AV CFX 30%, irreg in RCA up to 30%, EF 50%.  - Chest CT (05/2009): Calcified atherosclerotic disease in LAD and CFX - coronary arteries patent - Echo (06/2007): EF 50-55%, mild asc aortic dilation, mod MR, mod LAE, mod TR, mod RAE. - Carotid US (01/2013): R 40-59%, L 1-39% - f/u 1 yr. - Nuclear (02/2009 at [facility]): Normal  2D Echo 09/27/2013: Study Conclusions  - Left ventricle: Systolic function was normal. The estimated ejection fraction was in the range of 55% to 60%. Wall motion was normal; there were no regional wall motion abnormalities. Features are consistent with a pseudonormal left ventricular filling pattern, with concomitant abnormal relaxation and increased filling pressure (grade 2 diastolic dysfunction). - Aortic valve: Severe calcification involving the noncoronary cusp. Noncoronary cusp mobility was severely restricted. There was mild stenosis. - Mitral valve: Calcified annulus. Mildly thickened leaflets . -  Left atrium: The atrium was moderately dilated. - Tricuspid valve: There was moderate regurgitation. - Pulmonary arteries: PA peak pressure: 36 mm Hg (S).  Outpatient Encounter Prescriptions as of 03/16/2014  Medication Sig  . apixaban (ELIQUIS) 5 MG TABS tablet Take 1 tablet (5 mg total) by mouth 2 (two) times daily.  Marland Kitchen atenolol (TENORMIN) 25 MG tablet Take 1 tablet (25 mg total) by mouth every morning.  Marland Kitchen atorvastatin (LIPITOR) 20 MG tablet TAKE 1 TABLET EVERY DAY  . Calcium Carbonate-Vitamin D (CALCIUM + D PO) Take 1 tablet by mouth 3 (three) times a week.  . cholecalciferol (VITAMIN D) 1000 UNITS tablet Take 1,000 Units by mouth daily.  . Cyanocobalamin (VITAMIN B-12 CR PO) Take 1 tablet by mouth daily.  . Fluticasone-Salmeterol (ADVAIR) 250-50 MCG/DOSE AEPB Inhale 1 puff into the lungs 2 (two) times daily.  . hydrocortisone cream 1 % Apply 1 application topically 2 (two) times daily.  Marland Kitchen letrozole (FEMARA) 2.5 MG tablet TAKE 1 TABLET (2.5 MG TOTAL) BY MOUTH DAILY.  Marland Kitchen levalbuterol (XOPENEX HFA) 45 MCG/ACT inhaler Inhale 2 puffs into the lungs every 6 (six) hours as needed. For shortness of breath  . magnesium oxide (MAG-OX) 400 MG tablet Take 400 mg by mouth daily.  . metFORMIN (GLUCOPHAGE) 500 MG tablet Take 1 tablet (500 mg total) by mouth 2 (two) times daily with a meal.  . mometasone (NASONEX) 50 MCG/ACT nasal spray Place 2 sprays into the nose daily as needed (allergies).   . potassium chloride SA (K-DUR,KLOR-CON) 20 MEQ tablet Take 3 tablets (65meq) by mouth every morning and 2 tablets (49meq) every evening.  . senna-docusate (SENOKOT-S) 8.6-50 MG per tablet Take 1 tablet by mouth 2 (two)  times daily. Stop for diarrhea or loose stools  . SPIRIVA HANDIHALER 18 MCG inhalation capsule INHALE THE CONTENTS OF 1 CAPSULE EVERY DAY  . torsemide (DEMADEX) 10 MG tablet TAKE 1 TABLET EVERY DAY    Allergies  Allergen Reactions  . Cholestatin     RAGWEED SEASON...sneezing   . Sulfur Itching      Past Medical History  Diagnosis Date  . Sleep apnea     associated with hypersomnia  . Amiodarone pulmonary toxicity   . Chronic renal insufficiency, stage III (moderate)     CrCl about 60 ml/min  . Diabetes   . Tobacco abuse   . Diastolic heart failure     Acute on Chronic  . Pacemaker     Permanent  . AF (atrial fibrillation)      AV ablation 9/09 [name] per Dr Ola Spurr - AV node ablation 9/11 Dr Caryl Comes  . Hyperlipidemia   . GERD (gastroesophageal reflux disease)   . [name] (coronary artery disease)     (not sure of this 11/10  . COPD (chronic obstructive pulmonary disease)     emphysema -FeV1 73% DLCO 53% 5/09  . Invasive ductal carcinoma of breast 2011    LEFT   . OA (osteoarthritis) of knee     RIGHT  . Right sided sciatica   . Sinoatrial node dysfunction   . CHF (congestive heart failure)   . Mental disorder   . Acute blood loss anemia 04/20/2012  . Depression 06/06/2012  . Hx of cardiovascular stress test     Lexiscan Myoview (09/2013):  No definite ischemia, EF 67%; low risk  . Complication of anesthesia     "psycotic episode" after hip surg - resolved  . PAD (peripheral artery disease)     w/hx right iliac/SFA stenting and left and rt leg PTA  . CVA (cerebral vascular accident)     no residual effects evident to pt   . History of skin cancer   . Eczema     family history includes Heart disease in her father.   BP 102/60 mmHg  Pulse 70  Ht 5\' 5"  (1.651 m)  Wt 177 lb 12.8 oz (80.65 kg)  BMI 29.59 kg/m2  PHYSICAL EXAM: Pt is alert and oriented, NAD HEENT: normal Neck: JVP - normal, carotids 2+= without bruits Lungs: CTA bilaterally CV: RRR without murmur or gallop Abd: soft, NT, Positive BS, no hepatomegaly Ext: no C/C/E, DP pulses 2+ R and 1+ L Skin: warm/dry no rash  EKG:  AV dual paced rhythm 70 bpm.  ASSESSMENT AND PLAN: 1. PAD - no claudication symptoms. No antiplatelet Rx because of chronic anticoagulation. Continue with annual clinical  follow-up. Reserve imaging for change in symptoms.  2. Carotid stenosis - 00-76% RICA and < 22% LICA stenosis. 2 year f/u recommended  3. Persistent AF - s/p PPM. On chronic AC with apixaban. Followed by Dr Caryl Comes.  Pt is on appropriate Rx with apixaban, statin drug. Not limited by claudication symptoms. Will see back in one year.  Sherren Mocha, MD 03/16/2014 11:10 AM

## 2014-03-16 NOTE — Patient Instructions (Signed)
Your physician wants you to follow-up in: 1 year with Dr. Burt Knack.  You will receive a reminder letter in the mail two months in advance. If you don't receive a letter, please call our office to schedule the follow-up appointment.  Your physician recommends that you continue on your current medications as directed. Please refer to the Current Medication list given to you today.

## 2014-03-26 ENCOUNTER — Ambulatory Visit (INDEPENDENT_AMBULATORY_CARE_PROVIDER_SITE_OTHER): Payer: Medicare Other | Admitting: *Deleted

## 2014-03-26 DIAGNOSIS — I480 Paroxysmal atrial fibrillation: Secondary | ICD-10-CM

## 2014-03-26 LAB — MDC_IDC_ENUM_SESS_TYPE_INCLINIC
Battery Voltage: 2.69 V
Brady Statistic RA Percent Paced: 90 %
Brady Statistic RV Percent Paced: 99 %
Implantable Pulse Generator Model: 5386
Implantable Pulse Generator Serial Number: 1409809
Lead Channel Impedance Value: 510 Ohm
Lead Channel Pacing Threshold Amplitude: 0.625 V
Lead Channel Pacing Threshold Amplitude: 0.75 V
Lead Channel Pacing Threshold Pulse Width: 0.4 ms
Lead Channel Pacing Threshold Pulse Width: 0.5 ms
Lead Channel Sensing Intrinsic Amplitude: 1.4 mV
Lead Channel Setting Pacing Amplitude: 2 V
Lead Channel Setting Sensing Sensitivity: 4 mV
MDC IDC MSMT BATTERY IMPEDANCE: 8900 Ohm
MDC IDC MSMT LEADCHNL RA IMPEDANCE VALUE: 487 Ohm
MDC IDC SESS DTM: 20160118160808
MDC IDC SET LEADCHNL RV PACING PULSEWIDTH: 0.5 ms

## 2014-03-26 NOTE — Progress Notes (Signed)
Pacemaker check in clinic. Normal device function. Thresholds, sensing, impedances consistent with previous measurements. Device programmed to maximize longevity. 1265 mode switches (42%)---max dur. 41 days (7/30), Max A 591, last 1/14 + Eliquis. No high ventricular rates noted. Device programmed at appropriate safety margins. Histogram distribution appropriate for patient activity level. Device programmed to optimize intrinsic conduction. Estimated longevity 6-12 months. Patient will follow up via [employer] [facility], device clinic in 3 months, and 6 months w/SK.

## 2014-04-09 ENCOUNTER — Encounter: Payer: Self-pay | Admitting: Internal Medicine

## 2014-04-25 DIAGNOSIS — I495 Sick sinus syndrome: Secondary | ICD-10-CM

## 2014-04-25 DIAGNOSIS — Z950 Presence of cardiac pacemaker: Secondary | ICD-10-CM | POA: Diagnosis not present

## 2014-04-27 DIAGNOSIS — Z96651 Presence of right artificial knee joint: Secondary | ICD-10-CM | POA: Diagnosis not present

## 2014-04-27 DIAGNOSIS — M1712 Unilateral primary osteoarthritis, left knee: Secondary | ICD-10-CM | POA: Diagnosis not present

## 2014-04-27 DIAGNOSIS — Z471 Aftercare following joint replacement surgery: Secondary | ICD-10-CM | POA: Diagnosis not present

## 2014-05-14 ENCOUNTER — Ambulatory Visit (INDEPENDENT_AMBULATORY_CARE_PROVIDER_SITE_OTHER): Payer: Medicare Other | Admitting: Internal Medicine

## 2014-05-14 ENCOUNTER — Encounter: Payer: Self-pay | Admitting: Internal Medicine

## 2014-05-14 VITALS — BP 110/58 | HR 77 | Temp 97.8°F | Resp 18 | Ht 65.0 in | Wt 186.4 lb

## 2014-05-14 DIAGNOSIS — Z0000 Encounter for general adult medical examination without abnormal findings: Secondary | ICD-10-CM

## 2014-05-14 DIAGNOSIS — I5032 Chronic diastolic (congestive) heart failure: Secondary | ICD-10-CM

## 2014-05-14 DIAGNOSIS — J4489 Other specified chronic obstructive pulmonary disease: Secondary | ICD-10-CM

## 2014-05-14 DIAGNOSIS — J449 Chronic obstructive pulmonary disease, unspecified: Secondary | ICD-10-CM

## 2014-05-14 DIAGNOSIS — I480 Paroxysmal atrial fibrillation: Secondary | ICD-10-CM

## 2014-05-14 DIAGNOSIS — E118 Type 2 diabetes mellitus with unspecified complications: Secondary | ICD-10-CM

## 2014-05-14 MED ORDER — FLUTICASONE-SALMETEROL 250-50 MCG/DOSE IN AEPB
1.0000 | INHALATION_SPRAY | Freq: Two times a day (BID) | RESPIRATORY_TRACT | Status: DC
Start: 2014-05-14 — End: 2014-08-17

## 2014-05-14 NOTE — Patient Instructions (Signed)
We will have you start taking a women's one a day 65+ multivitamin or the generic that is the same. You can stop taking the calcium/vitamin D, vitamin D, B12, magnesium.  For the potassium you can take 1 pill once a day. We will recheck the blood work once you are on the new dose of the potassium for about 2-3 weeks.   The Alysia Penna will stop in December this year and that will another thing you can stop.   The senokot you can try to come off of, this is a medicine to help keep your bowels moving well. If you get constipated start taking it again.   Health Maintenance Adopting a healthy lifestyle and getting preventive care can go a long way to promote health and wellness. Talk with your health care provider about what schedule of regular examinations is right for you. This is a good chance for you to check in with your provider about disease prevention and staying healthy. In between checkups, there are plenty of things you can do on your own. Experts have done a lot of research about which lifestyle changes and preventive measures are most likely to keep you healthy. Ask your health care provider for more information. WEIGHT AND DIET  Eat a healthy diet  Be sure to include plenty of vegetables, fruits, low-fat dairy products, and lean protein.  Do not eat a lot of foods high in solid fats, added sugars, or salt.  Get regular exercise. This is one of the most important things you can do for your health.  Most adults should exercise for at least 150 minutes each week. The exercise should increase your heart rate and make you sweat (moderate-intensity exercise).  Most adults should also do strengthening exercises at least twice a week. This is in addition to the moderate-intensity exercise.  Maintain a healthy weight  Body mass index (BMI) is a measurement that can be used to identify possible weight problems. It estimates body fat based on height and weight. Your health care provider can help  determine your BMI and help you achieve or maintain a healthy weight.  For females 13 years of age and older:   A BMI below 18.5 is considered underweight.  A BMI of 18.5 to 24.9 is normal.  A BMI of 25 to 29.9 is considered overweight.  A BMI of 30 and above is considered obese.  Watch levels of cholesterol and blood lipids  You should start having your blood tested for lipids and cholesterol at 79 years of age, then have this test every 5 years.  You may need to have your cholesterol levels checked more often if:  Your lipid or cholesterol levels are high.  You are older than 79 years of age.  You are at high risk for heart disease.  CANCER SCREENING   Lung Cancer  Lung cancer screening is recommended for adults 45-41 years old who are at high risk for lung cancer because of a history of smoking.  A yearly low-dose CT scan of the lungs is recommended for people who:  Currently smoke.  Have quit within the past 15 years.  Have at least a 30-pack-year history of smoking. A pack year is smoking an average of one pack of cigarettes a day for 1 year.  Yearly screening should continue until it has been 15 years since you quit.  Yearly screening should stop if you develop a health problem that would prevent you from having lung cancer treatment.  Breast Cancer  Practice breast self-awareness. This means understanding how your breasts normally appear and feel.  It also means doing regular breast self-exams. Let your health care provider know about any changes, no matter how small.  If you are in your 20s or 30s, you should have a clinical breast exam (CBE) by a health care provider every 1-3 years as part of a regular health exam.  If you are 57 or older, have a CBE every year. Also consider having a breast X-ray (mammogram) every year.  If you have a family history of breast cancer, talk to your health care provider about genetic screening.  If you are at high risk  for breast cancer, talk to your health care provider about having an MRI and a mammogram every year.  Breast cancer gene (BRCA) assessment is recommended for women who have family members with BRCA-related cancers. BRCA-related cancers include:  Breast.  Ovarian.  Tubal.  Peritoneal cancers.  Results of the assessment will determine the need for genetic counseling and BRCA1 and BRCA2 testing. Cervical Cancer Routine pelvic examinations to screen for cervical cancer are no longer recommended for nonpregnant women who are considered low risk for cancer of the pelvic organs (ovaries, uterus, and vagina) and who do not have symptoms. A pelvic examination may be necessary if you have symptoms including those associated with pelvic infections. Ask your health care provider if a screening pelvic exam is right for you.   The Pap test is the screening test for cervical cancer for women who are considered at risk.  If you had a hysterectomy for a problem that was not cancer or a condition that could lead to cancer, then you no longer need Pap tests.  If you are older than 65 years, and you have had normal Pap tests for the past 10 years, you no longer need to have Pap tests.  If you have had past treatment for cervical cancer or a condition that could lead to cancer, you need Pap tests and screening for cancer for at least 20 years after your treatment.  If you no longer get a Pap test, assess your risk factors if they change (such as having a new sexual partner). This can affect whether you should start being screened again.  Some women have medical problems that increase their chance of getting cervical cancer. If this is the case for you, your health care provider may recommend more frequent screening and Pap tests.  The human papillomavirus (HPV) test is another test that may be used for cervical cancer screening. The HPV test looks for the virus that can cause cell changes in the cervix. The  cells collected during the Pap test can be tested for HPV.  The HPV test can be used to screen women 92 years of age and older. Getting tested for HPV can extend the interval between normal Pap tests from three to five years.  An HPV test also should be used to screen women of any age who have unclear Pap test results.  After 79 years of age, women should have HPV testing as often as Pap tests.  Colorectal Cancer  This type of cancer can be detected and often prevented.  Routine colorectal cancer screening usually begins at 79 years of age and continues through 79 years of age.  Your health care provider may recommend screening at an earlier age if you have risk factors for colon cancer.  Your health care provider may also recommend using  home test kits to check for hidden blood in the stool.  A small camera at the end of a tube can be used to examine your colon directly (sigmoidoscopy or colonoscopy). This is done to check for the earliest forms of colorectal cancer.  Routine screening usually begins at age 17.  Direct examination of the colon should be repeated every 5-10 years through 79 years of age. However, you may need to be screened more often if early forms of precancerous polyps or small growths are found. Skin Cancer  Check your skin from head to toe regularly.  Tell your health care provider about any new moles or changes in moles, especially if there is a change in a mole's shape or color.  Also tell your health care provider if you have a mole that is larger than the size of a pencil eraser.  Always use sunscreen. Apply sunscreen liberally and repeatedly throughout the day.  Protect yourself by wearing long sleeves, pants, a wide-brimmed hat, and sunglasses whenever you are outside. HEART DISEASE, DIABETES, AND HIGH BLOOD PRESSURE   Have your blood pressure checked at least every 1-2 years. High blood pressure causes heart disease and increases the risk of  stroke.  If you are between 62 years and 71 years old, ask your health care provider if you should take aspirin to prevent strokes.  Have regular diabetes screenings. This involves taking a blood sample to check your fasting blood sugar level.  If you are at a normal weight and have a low risk for diabetes, have this test once every three years after 79 years of age.  If you are overweight and have a high risk for diabetes, consider being tested at a younger age or more often. PREVENTING INFECTION  Hepatitis B  If you have a higher risk for hepatitis B, you should be screened for this virus. You are considered at high risk for hepatitis B if:  You were born in a country where hepatitis B is common. Ask your health care provider which countries are considered high risk.  Your parents were born in a high-risk country, and you have not been immunized against hepatitis B (hepatitis B vaccine).  You have HIV or AIDS.  You use needles to inject street drugs.  You live with someone who has hepatitis B.  You have had sex with someone who has hepatitis B.  You get hemodialysis treatment.  You take certain medicines for conditions, including cancer, organ transplantation, and autoimmune conditions. Hepatitis C  Blood testing is recommended for:  Everyone born from [birth year] through 1963.  Anyone with known risk factors for hepatitis C. Sexually transmitted infections (STIs)  You should be screened for sexually transmitted infections (STIs) including gonorrhea and chlamydia if:  You are sexually active and are younger than 79 years of age.  You are older than 79 years of age and your health care provider tells you that you are at risk for this type of infection.  Your sexual activity has changed since you were last screened and you are at an increased risk for chlamydia or gonorrhea. Ask your health care provider if you are at risk.  If you do not have HIV, but are at risk, it may be  recommended that you take a prescription medicine daily to prevent HIV infection. This is called pre-exposure prophylaxis (PrEP). You are considered at risk if:  You are sexually active and do not regularly use condoms or know the HIV status of your  partner(s).  You take drugs by injection.  You are sexually active with a partner who has HIV. Talk with your health care provider about whether you are at high risk of being infected with HIV. If you choose to begin PrEP, you should first be tested for HIV. You should then be tested every 3 months for as long as you are taking PrEP.  PREGNANCY   If you are premenopausal and you may become pregnant, ask your health care provider about preconception counseling.  If you may become pregnant, take 400 to 800 micrograms (mcg) of folic acid every day.  If you want to prevent pregnancy, talk to your health care provider about birth control (contraception). OSTEOPOROSIS AND MENOPAUSE   Osteoporosis is a disease in which the bones lose minerals and strength with aging. This can result in serious bone fractures. Your risk for osteoporosis can be identified using a bone density scan.  If you are 51 years of age or older, or if you are at risk for osteoporosis and fractures, ask your health care provider if you should be screened.  Ask your health care provider whether you should take a calcium or vitamin D supplement to lower your risk for osteoporosis.  Menopause may have certain physical symptoms and risks.  Hormone replacement therapy may reduce some of these symptoms and risks. Talk to your health care provider about whether hormone replacement therapy is right for you.  HOME CARE INSTRUCTIONS   Schedule regular health, dental, and eye exams.  Stay current with your immunizations.   Do not use any tobacco products including cigarettes, chewing tobacco, or electronic cigarettes.  If you are pregnant, do not drink alcohol.  If you are  breastfeeding, limit how much and how often you drink alcohol.  Limit alcohol intake to no more than 1 drink per day for nonpregnant women. One drink equals 12 ounces of beer, 5 ounces of wine, or 1 ounces of hard liquor.  Do not use street drugs.  Do not share needles.  Ask your health care provider for help if you need support or information about quitting drugs.  Tell your health care provider if you often feel depressed.  Tell your health care provider if you have ever been abused or do not feel safe at home. Document Released: 09/08/2010 Document Revised: 07/10/2013 Document Reviewed: 01/25/2013 [facility] Patient Information 2015 [place], [place]. This information is not intended to replace advice given to you by your health care provider. Make sure you discuss any questions you have with your health care provider.

## 2014-05-14 NOTE — Progress Notes (Signed)
   Subjective:    Patient ID: [name], female    DOB: 1931-11-09, 81 y.o.   MRN: [id number]  HPI The patient is an 81 YO female here for medicare wellness. She denies any new complaints since last visit. She has recently been to see the cardiologist. Her pacemaker battery is getting low and she is not sure why they will not just replace it right now.   Diet: DM since diabetic Physical activity: sedentary Depression/mood screen: negative Hearing: intact to whispered voice Visual acuity: grossly normal, performs annual eye exam  ADLs: capable Fall risk: none Home safety: good Cognitive evaluation: intact to orientation, naming, recall and repetition EOL planning: adv directives discussed with her today  I have personally reviewed and have noted today 1. The patient's medical and social history - no updates reviewed with patient 2. Their use of alcohol, tobacco or illicit drugs 3. Their current medications and supplements 4. The patient's functional ability including ADL's, fall risks, home safety risks and hearing or visual impairment. 5. Diet and physical activities 6. Evidence for depression or mood disorders 7. Care team reviewed and updated (available in snapshot view)  Review of Systems  Constitutional: Negative for fever, chills, activity change, appetite change and fatigue.  HENT: Negative for ear discharge, ear pain, postnasal drip, rhinorrhea, sinus pressure and sore throat.   Eyes: Negative.   Respiratory: Negative for cough, chest tightness, shortness of breath and wheezing.   Cardiovascular: Negative for chest pain, palpitations and leg swelling.  Gastrointestinal: Negative.   Musculoskeletal: Negative.   Skin: Negative.   Neurological: Negative.   Psychiatric/Behavioral: Negative.       Objective:   Physical Exam  Constitutional: She appears well-developed and well-nourished.  HENT:  Head: Normocephalic and atraumatic.  Right Ear: External ear normal.    Left Ear: External ear normal.  Mouth/Throat: Oropharynx is clear and moist.  Eyes: EOM are normal.  Neck: Normal range of motion.  Cardiovascular: Normal rate and regular rhythm.   Pulmonary/Chest: Effort normal and breath sounds normal. No respiratory distress. She has no wheezes. She has no rales.  Abdominal: Soft. Bowel sounds are normal.  Musculoskeletal: She exhibits no edema.  Lymphadenopathy:    She has no cervical adenopathy.  Skin: Skin is warm and dry.   Filed Vitals:   05/14/14 1305  BP: 110/58  Pulse: 77  Temp: 97.8 F (36.6 C)  TempSrc: Oral  Resp: 18  Height: 5\' 5"  (1.651 m)  Weight: 186 lb 6.4 oz (84.55 kg)  SpO2: 94%      Assessment & Plan:

## 2014-05-14 NOTE — Progress Notes (Signed)
Pre visit review using our clinic review tool, if applicable. No additional management support is needed unless otherwise documented below in the visit note. 

## 2014-05-15 ENCOUNTER — Encounter: Payer: Self-pay | Admitting: Internal Medicine

## 2014-05-15 DIAGNOSIS — Z0000 Encounter for general adult medical examination without abnormal findings: Secondary | ICD-10-CM | POA: Insufficient documentation

## 2014-05-15 NOTE — Assessment & Plan Note (Signed)
No exacerbation today, taking advair and spiriva with good control. Non-smoker right now and no intention to resume.

## 2014-05-15 NOTE — Assessment & Plan Note (Signed)
Continue beta blocker, fluid pill per medication list. She is doing well and not in exacerbation.

## 2014-05-15 NOTE — Assessment & Plan Note (Signed)
On eliqius currently without problems, talked to her about need to seek care for any bleeding she is not able to control or compress. Rate control on beta blocker.

## 2014-05-15 NOTE — Assessment & Plan Note (Addendum)
Aged out of colon cancer screening. For her polypharmacy today we have consolidated her OTC vitamins to a multivitamin and STOP vitamin D, calcium, magnesium, B12. Will decrease her potassium to 20 mEq daily. She is on quite a significant amount and she does not admit to significant urination with torsemide. Will recheck at next visit and can change back if needed. She will be able to stop femara in Dec (finish her course then). Up to date on her shots except shingles which she declines. She was given list of screening tests. Talked to her about home safety today during our visit.

## 2014-05-15 NOTE — Assessment & Plan Note (Addendum)
Reviewed HgA1c and BMP today. On metformin single agent with good control.

## 2014-05-17 ENCOUNTER — Encounter: Payer: Self-pay | Admitting: Internal Medicine

## 2014-05-24 ENCOUNTER — Encounter: Payer: Self-pay | Admitting: Internal Medicine

## 2014-05-24 DIAGNOSIS — I495 Sick sinus syndrome: Secondary | ICD-10-CM | POA: Diagnosis not present

## 2014-06-05 ENCOUNTER — Other Ambulatory Visit: Payer: Self-pay | Admitting: Internal Medicine

## 2014-06-06 ENCOUNTER — Telehealth: Payer: Self-pay

## 2014-06-06 ENCOUNTER — Other Ambulatory Visit (INDEPENDENT_AMBULATORY_CARE_PROVIDER_SITE_OTHER): Payer: Medicare Other

## 2014-06-06 DIAGNOSIS — I5032 Chronic diastolic (congestive) heart failure: Secondary | ICD-10-CM | POA: Diagnosis not present

## 2014-06-06 LAB — BASIC METABOLIC PANEL
BUN: 18 mg/dL (ref 6–23)
CO2: 32 mEq/L (ref 19–32)
Calcium: 9.9 mg/dL (ref 8.4–10.5)
Chloride: 103 mEq/L (ref 96–112)
Creatinine, Ser: 0.92 mg/dL (ref 0.40–1.20)
GFR: 62.27 mL/min (ref >60.00)
Glucose, Bld: 210 mg/dL — ABNORMAL HIGH (ref 70–99)
Potassium: 4 mEq/L (ref 3.5–5.1)
Sodium: 137 mEq/L (ref 135–145)

## 2014-06-06 NOTE — Telephone Encounter (Signed)
Pt went to the lab today and demanded to have lab work done. Pt did not know why. Lab called and I picked up the call. [facility] stated pt was there. I reviewed the last visit notes and it stated to recheck K after 2-3 weeks to check potassium.  Pt stated that she had not been taking her potassium. I entered in the order per the [facility] AVS.

## 2014-06-07 ENCOUNTER — Ambulatory Visit (INDEPENDENT_AMBULATORY_CARE_PROVIDER_SITE_OTHER): Payer: Medicare Other | Admitting: Pulmonary Disease

## 2014-06-07 ENCOUNTER — Encounter: Payer: Self-pay | Admitting: Pulmonary Disease

## 2014-06-07 VITALS — BP 122/62 | HR 70 | Temp 98.5°F | Ht 65.0 in | Wt 183.6 lb

## 2014-06-07 DIAGNOSIS — Z950 Presence of cardiac pacemaker: Secondary | ICD-10-CM

## 2014-06-07 DIAGNOSIS — J449 Chronic obstructive pulmonary disease, unspecified: Secondary | ICD-10-CM

## 2014-06-07 DIAGNOSIS — I6523 Occlusion and stenosis of bilateral carotid arteries: Secondary | ICD-10-CM | POA: Diagnosis not present

## 2014-06-07 DIAGNOSIS — R0789 Other chest pain: Secondary | ICD-10-CM | POA: Insufficient documentation

## 2014-06-07 DIAGNOSIS — G4733 Obstructive sleep apnea (adult) (pediatric): Secondary | ICD-10-CM | POA: Diagnosis not present

## 2014-06-07 DIAGNOSIS — I4891 Unspecified atrial fibrillation: Secondary | ICD-10-CM

## 2014-06-07 DIAGNOSIS — I5032 Chronic diastolic (congestive) heart failure: Secondary | ICD-10-CM

## 2014-06-07 DIAGNOSIS — I2510 Atherosclerotic heart disease of native coronary artery without angina pectoris: Secondary | ICD-10-CM | POA: Diagnosis not present

## 2014-06-07 MED ORDER — TRAMADOL HCL 50 MG PO TABS: ORAL_TABLET | ORAL | Status: DC

## 2014-06-07 NOTE — Patient Instructions (Signed)
Today we updated your med list in our EPIC system...    Continue your current medications the same including your Advair & spiriva  For the pain in your right chest>    Rest the chest- do not do any lifting etc...    Apply HEAT- hot towel, heating pad, deep heat cream or patch...    Try th TRAMADOL prescription- 1/2 to 1 tab up to every 6h as needed for pain...  Call for any questions...  Keep your follow up appt as sched w/ DrWright in April.Marland KitchenMarland Kitchen

## 2014-06-09 ENCOUNTER — Encounter: Payer: Self-pay | Admitting: Pulmonary Disease

## 2014-06-09 NOTE — Progress Notes (Signed)
Subjective:    Patient ID: [name], female    DOB: 03-09-1932, 81 y.o.   MRN: [id number]  HPI  12/13/2013 ~ [place] Chief Complaint  Patient presents with  . Follow-up    Pt states her breathing is at baseline.  SOB with exertion, prod cough with creamy mucus, worse in mornings.  Had knee sx in August which has slowed her down some.  Pt tolerating cpap/nocturnal 02 well.    Cough is at baseline.  occ creamy mucus, in the AM.  On cpap qhs plus oxygen.   ?MOLD issue.  Pt had R TKR.  Helped the pain.  Mobility is worse , pain is better.  Pt denies any significant sore throat, nasal congestion or excess secretions, fever, chills, sweats, unintended weight loss, pleurtic or exertional chest pain, orthopnea PND, or leg swelling Pt denies any increase in rescue therapy over baseline, denies waking up needing it or having any early am or nocturnal exacerbations of coughing/wheezing/or dyspnea. Pt also denies any obvious fluctuation in symptoms with  weather or environmental change or other alleviating or aggravating factors  ~  June 07, 2014:  Add-on appt requested for right chest pain> presents w/ 1d hx sharp right CP located along the lower right sternal border & costal margin; states she awoke w/ the pain, dull/ tender/ est 5-6/10 severity, seemed to diminish over the day; she realized that she slept on a diff side of the bed & didn't wear her bra and she thinks that this may have had something to do w/ it since there was no known trauma;  The pain is well localized & sl tender to palpation; she has assoc min cough, clear sput, no hemoptysis, denies fever but notes some chills & sweats from her Femara; she denies much SOB... She gives quite a rambling hx & spent a lot of time voicing her concern about not getting her pacemaker battery changed yet...    She is an ex-smoker w/ hx GOLD Stage 2 COPD, Pt Group B- based on Spirometry reported in 2009 (FEV1=73% predicted) and more symptoms (based on  CAT score and exacerbations); on ADVAIR250-Bid & SPIRIVA daily & ProairHFA    She has OSA on CPAP nightly EXAM reveals Afeb, VSS, O2sat=92% on RA at rest;  Chest exam shows clear lungs w/o w/r/r but tender right costal margin on palpation... PLAN>> MrsTurner appears to have costochondritis w/ sore/ sl tender area at right costal margin; her breathing appears to be at baseline; she is rec to continue her cureent meds/ inhalers and for the CWP- rest the chest, apply heat and try TRAMADOL 50mg - 1/2 to 1 tab Q6h as needed for pain   Past Medical History  Diagnosis Date  . Sleep apnea     associated with hypersomnia  . Amiodarone pulmonary toxicity   . Chronic renal insufficiency, stage III (moderate)     CrCl about 60 ml/min  . Diabetes   . Tobacco abuse   . Diastolic heart failure     Acute on Chronic  . Pacemaker     Permanent  . AF (atrial fibrillation)      AV ablation 9/09 [place] per Dr Ola Spurr - AV node ablation 9/11 Dr Caryl Comes  . Hyperlipidemia   . GERD (gastroesophageal reflux disease)   . [name] (coronary artery disease)     (not sure of this 11/10  . COPD (chronic obstructive pulmonary disease)     emphysema -FeV1 73% DLCO 53% 5/09  .  Invasive ductal carcinoma of breast 2011    LEFT   . OA (osteoarthritis) of knee     RIGHT  . Right sided sciatica   . Sinoatrial node dysfunction   . CHF (congestive heart failure)   . Mental disorder   . Acute blood loss anemia 04/20/2012  . Depression 06/06/2012  . Hx of cardiovascular stress test     Lexiscan Myoview (09/2013):  No definite ischemia, EF 67%; low risk  . Complication of anesthesia     "psycotic episode" after hip surg - resolved  . PAD (peripheral artery disease)     w/hx right iliac/SFA stenting and left and rt leg PTA  . CVA (cerebral vascular accident)     no residual effects evident to pt   . History of skin cancer   . Eczema     Outpatient Encounter Prescriptions as of 06/07/2014  Medication Sig  . apixaban  (ELIQUIS) 5 MG TABS tablet Take 1 tablet (5 mg total) by mouth 2 (two) times daily.  Marland Kitchen atorvastatin (LIPITOR) 20 MG tablet TAKE 1 TABLET EVERY DAY  . Fluticasone-Salmeterol (ADVAIR) 250-50 MCG/DOSE AEPB Inhale 1 puff into the lungs 2 (two) times daily.  Marland Kitchen letrozole (FEMARA) 2.5 MG tablet TAKE 1 TABLET (2.5 MG TOTAL) BY MOUTH DAILY.  Marland Kitchen levalbuterol (XOPENEX HFA) 45 MCG/ACT inhaler Inhale 2 puffs into the lungs every 6 (six) hours as needed. For shortness of breath  . metFORMIN (GLUCOPHAGE) 500 MG tablet TAKE 1 TABLET TWICE DAILY WITH A MEAL  . mometasone (NASONEX) 50 MCG/ACT nasal spray Place 2 sprays into the nose daily as needed (allergies).   . Multiple Vitamins-Minerals (MULTIVITAMIN WITH MINERALS) tablet Take 1 tablet by mouth daily.  . potassium chloride SA (K-DUR,KLOR-CON) 20 MEQ tablet Take 3 tablets (32meq) by mouth every morning and 2 tablets (13meq) every evening. (Patient taking differently: 20 mEq daily. Take 3 tablets (6meq) by mouth every morning and 2 tablets (75meq) every evening.)  . senna-docusate (SENOKOT-S) 8.6-50 MG per tablet Take 1 tablet by mouth 2 (two) times daily. Stop for diarrhea or loose stools  . SPIRIVA HANDIHALER 18 MCG inhalation capsule INHALE THE CONTENTS OF 1 CAPSULE EVERY DAY  . torsemide (DEMADEX) 10 MG tablet TAKE 1 TABLET EVERY DAY  . atenolol (TENORMIN) 25 MG tablet Take 1 tablet (25 mg total) by mouth every morning. (Patient not taking: Reported on 06/07/2014)  . hydrocortisone cream 1 % Apply 1 application topically 2 (two) times daily. (Patient not taking: Reported on 06/07/2014)  . traMADol (ULTRAM) 50 MG tablet Take 1/2 to 1 tablet by mouth every 6 hours as needed for pain    Allergies  Allergen Reactions  . Cholestatin     RAGWEED SEASON...sneezing   . Sulfur Itching    Current Medications, Allergies, Past Medical History, Past Surgical History, Family History, and Social History were reviewed in [employer]  record.   Review of Systems  neg for any significant sore throat, dysphagia, itching, sneezing, nasal congestion or excess/ purulent secretions, fever, chills, sweats, unintended wt loss, pleuritic or exertional cp, hempoptysis, orthopnea pnd or change in chronic leg swelling. Also denies presyncope, palpitations, heartburn, abdominal pain, nausea, vomiting, diarrhea or change in bowel or urinary habits, dysuria,hematuria, rash, arthralgias, visual complaints, headache, numbness weakness or ataxia.     Objective:   Physical Exam  BP 122/62 mmHg  Pulse 70  Temp(Src) 98.5 F (36.9 C) (Oral)  Ht 5\' 5"  (1.651 m)  Wt 183 lb 9.6 oz (  83.28 kg)  BMI 30.55 kg/m2  SpO2 92%  Gen. Pleasant, well-nourished, in no distress ENT - no lesions, no post nasal drip Neck: No JVD, no thyromegaly, no carotid bruits Lungs: no use of accessory muscles, no dullness to percussion, clear w/o wheezing/ rales/ rhonchi Tender chest wall on palpation right costal margin Cardiovascular: Rhythm regular, heart sounds  normal, no murmurs or gallops, no peripheral edema Musculoskeletal: No deformities, no cyanosis or clubbing      Assessment & Plan:    CHEST WALL PAIN >> MrsTurner appears to have costochondritis w/ sore/ sl tender area at right costal margin; her breathing appears to be at baseline; she is rec to continue her cureent meds/ inhalers and for the CWP- rest the chest, apply heat and try TRAMADOL 50mg - 1/2 to 1 tab Q6h as needed for pain.  COPD, ex-smoker>  on ADVAIR250-Bid & SPIRIVA daily & ProairHFA- continue same...  OSA on CPAP> continue CPAP   Patient's Medications  New Prescriptions   TRAMADOL (ULTRAM) 50 MG TABLET    Take 1/2 to 1 tablet by mouth every 6 hours as needed for pain  Previous Medications   APIXABAN (ELIQUIS) 5 MG TABS TABLET    Take 1 tablet (5 mg total) by mouth 2 (two) times daily.   ATENOLOL (TENORMIN) 25 MG TABLET    Take 1 tablet (25 mg total) by mouth every morning.    ATORVASTATIN (LIPITOR) 20 MG TABLET    TAKE 1 TABLET EVERY DAY   FLUTICASONE-SALMETEROL (ADVAIR) 250-50 MCG/DOSE AEPB    Inhale 1 puff into the lungs 2 (two) times daily.   HYDROCORTISONE CREAM 1 %    Apply 1 application topically 2 (two) times daily.   LETROZOLE (FEMARA) 2.5 MG TABLET    TAKE 1 TABLET (2.5 MG TOTAL) BY MOUTH DAILY.   LEVALBUTEROL (XOPENEX HFA) 45 MCG/ACT INHALER    Inhale 2 puffs into the lungs every 6 (six) hours as needed. For shortness of breath   METFORMIN (GLUCOPHAGE) 500 MG TABLET    TAKE 1 TABLET TWICE DAILY WITH A MEAL   MOMETASONE (NASONEX) 50 MCG/ACT NASAL SPRAY    Place 2 sprays into the nose daily as needed (allergies).    MULTIPLE VITAMINS-MINERALS (MULTIVITAMIN WITH MINERALS) TABLET    Take 1 tablet by mouth daily.   POTASSIUM CHLORIDE SA (K-DUR,KLOR-CON) 20 MEQ TABLET    Take 3 tablets (65meq) by mouth every morning and 2 tablets (54meq) every evening.   SENNA-DOCUSATE (SENOKOT-S) 8.6-50 MG PER TABLET    Take 1 tablet by mouth 2 (two) times daily. Stop for diarrhea or loose stools   SPIRIVA HANDIHALER 18 MCG INHALATION CAPSULE    INHALE THE CONTENTS OF 1 CAPSULE EVERY DAY   TORSEMIDE (DEMADEX) 10 MG TABLET    TAKE 1 TABLET EVERY DAY  Modified Medications   No medications on file  Discontinued Medications   No medications on file

## 2014-06-25 ENCOUNTER — Ambulatory Visit (INDEPENDENT_AMBULATORY_CARE_PROVIDER_SITE_OTHER): Payer: Medicare Other | Admitting: Critical Care Medicine

## 2014-06-25 ENCOUNTER — Encounter: Payer: Self-pay | Admitting: Critical Care Medicine

## 2014-06-25 VITALS — BP 100/60 | HR 70 | Temp 97.9°F | Ht 65.0 in | Wt 183.0 lb

## 2014-06-25 DIAGNOSIS — J449 Chronic obstructive pulmonary disease, unspecified: Secondary | ICD-10-CM

## 2014-06-25 NOTE — Assessment & Plan Note (Signed)
Stable gold C copd exsmoker Plan No change in inhaled or maintenance medications. Return in  4 months

## 2014-06-25 NOTE — Progress Notes (Signed)
Subjective:    Patient ID: [name], female    DOB: [birth year], 81 y.o.   MRN: [id number]  HPI  06/25/2014 Chief Complaint  Patient presents with  . Follow-up    Pt states breathing is the same. C/o prod cough with clear mucus, sinus drainage. Denies any wheezing, chest tightness/congestion.   Pt had chest pain now is better.  Dyspnea is at baseline.  Notes some clear mucus.  Notes pndrip with pollen.  No wheezing .  No fever/chills/sweats. No smoking.    Pt denies any significant sore throat, nasal congestion or excess secretions, fever, chills, sweats, unintended weight loss, pleurtic or exertional chest pain, orthopnea PND, or leg swelling Pt denies any increase in rescue therapy over baseline, denies waking up needing it or having any early am or nocturnal exacerbations of coughing/wheezing/or dyspnea. Pt also denies any obvious fluctuation in symptoms with  weather or environmental change or other alleviating or aggravating factors    Current Medications, Allergies, Past Medical History, Past Surgical History, Family History, and Social History were reviewed in [employer] record.   Review of Systems neg for any significant sore throat, dysphagia, itching, sneezing, nasal congestion or excess/ purulent secretions, fever, chills, sweats, unintended wt loss, pleuritic or exertional cp, hempoptysis, orthopnea pnd or change in chronic leg swelling. Also denies presyncope, palpitations, heartburn, abdominal pain, nausea, vomiting, diarrhea or change in bowel or urinary habits, dysuria,hematuria, rash, arthralgias, visual complaints, headache, numbness weakness or ataxia.     Objective:   Physical Exam BP 100/60 mmHg  Pulse 70  Temp(Src) 97.9 F (36.6 C) (Oral)  Ht 5\' 5"  (1.651 m)  Wt 183 lb (83.008 kg)  BMI 30.45 kg/m2  SpO2 93%  Gen. Pleasant, well-nourished, in no distress ENT - no lesions, no post nasal drip Neck: No JVD, no thyromegaly, no  carotid bruits Lungs: no use of accessory muscles, no dullness to percussion, clear w/o wheezing/ rales/ rhonchi, distant bs Cardiovascular: Rhythm regular, heart sounds  normal, no murmurs or gallops, no peripheral edema Musculoskeletal: No deformities, no cyanosis or clubbing      Assessment & Plan:   Gold stage C. COPD with frequent exacerbations Stable gold C copd exsmoker Plan No change in inhaled or maintenance medications. Return in  4 months    Updated Medication List Outpatient Encounter Prescriptions as of 06/25/2014  Medication Sig  . apixaban (ELIQUIS) 5 MG TABS tablet Take 1 tablet (5 mg total) by mouth 2 (two) times daily.  Marland Kitchen atenolol (TENORMIN) 25 MG tablet Take 1 tablet (25 mg total) by mouth every morning.  Marland Kitchen atorvastatin (LIPITOR) 20 MG tablet TAKE 1 TABLET EVERY DAY  . Fluticasone-Salmeterol (ADVAIR) 250-50 MCG/DOSE AEPB Inhale 1 puff into the lungs 2 (two) times daily.  Marland Kitchen letrozole (FEMARA) 2.5 MG tablet TAKE 1 TABLET (2.5 MG TOTAL) BY MOUTH DAILY.  Marland Kitchen levalbuterol (XOPENEX HFA) 45 MCG/ACT inhaler Inhale 2 puffs into the lungs every 6 (six) hours as needed. For shortness of breath  . metFORMIN (GLUCOPHAGE) 500 MG tablet TAKE 1 TABLET TWICE DAILY WITH A MEAL  . mometasone (NASONEX) 50 MCG/ACT nasal spray Place 2 sprays into the nose daily as needed (allergies).   . Multiple Vitamins-Minerals (MULTIVITAMIN WITH MINERALS) tablet Take 1 tablet by mouth daily.  . potassium chloride SA (K-DUR,KLOR-CON) 20 MEQ tablet Take 3 tablets (67meq) by mouth every morning and 2 tablets (84meq) every evening. (Patient taking differently: 20 mEq daily. Take 3 tablets (68meq) by mouth every morning  and 2 tablets (90meq) every evening.)  . senna-docusate (SENOKOT-S) 8.6-50 MG per tablet Take 1 tablet by mouth 2 (two) times daily. Stop for diarrhea or loose stools  . SPIRIVA HANDIHALER 18 MCG inhalation capsule INHALE THE CONTENTS OF 1 CAPSULE EVERY DAY  . torsemide (DEMADEX) 10 MG tablet  TAKE 1 TABLET EVERY DAY  . hydrocortisone cream 1 % Apply 1 application topically 2 (two) times daily. (Patient not taking: Reported on 06/07/2014)  . traMADol (ULTRAM) 50 MG tablet Take 1/2 to 1 tablet by mouth every 6 hours as needed for pain (Patient not taking: Reported on 06/25/2014)

## 2014-06-25 NOTE — Patient Instructions (Addendum)
No change in medications. Return in        6 months        

## 2014-06-27 ENCOUNTER — Ambulatory Visit (INDEPENDENT_AMBULATORY_CARE_PROVIDER_SITE_OTHER): Payer: Medicare Other | Admitting: *Deleted

## 2014-06-27 DIAGNOSIS — Z45010 Encounter for checking and testing of cardiac pacemaker pulse generator [battery]: Secondary | ICD-10-CM

## 2014-06-27 DIAGNOSIS — Z950 Presence of cardiac pacemaker: Secondary | ICD-10-CM | POA: Diagnosis not present

## 2014-06-27 DIAGNOSIS — I481 Persistent atrial fibrillation: Secondary | ICD-10-CM | POA: Diagnosis not present

## 2014-06-27 DIAGNOSIS — I4819 Other persistent atrial fibrillation: Secondary | ICD-10-CM

## 2014-06-27 LAB — MDC_IDC_ENUM_SESS_TYPE_INCLINIC
Battery Impedance: 13200 Ohm
Brady Statistic RA Percent Paced: 87 %
Brady Statistic RV Percent Paced: 99 %
Lead Channel Impedance Value: 503 Ohm
Lead Channel Impedance Value: 506 Ohm
Lead Channel Setting Pacing Amplitude: 2 V
Lead Channel Setting Pacing Pulse Width: 0.5 ms
Lead Channel Setting Sensing Sensitivity: 4 mV
MDC IDC MSMT BATTERY VOLTAGE: 2.66 V
MDC IDC PG SERIAL: 1409809
MDC IDC SESS DTM: 20160420133441

## 2014-06-27 NOTE — Progress Notes (Signed)
Battery check only. Estimated longevity 0.5-0.33yrs. 53% mode switch + eliquis. ROV w/ Dr. Caryl Comes 10/05/14.

## 2014-07-02 ENCOUNTER — Telehealth: Payer: Self-pay | Admitting: Oncology

## 2014-07-02 NOTE — Telephone Encounter (Signed)
pt cld & left vm wanting to ck appts-cld pt back & adv pt that appt was 5/2-pt understood

## 2014-07-09 ENCOUNTER — Telehealth: Payer: Self-pay | Admitting: Oncology

## 2014-07-09 ENCOUNTER — Ambulatory Visit: Payer: Medicare Other

## 2014-07-09 ENCOUNTER — Other Ambulatory Visit: Payer: Medicare Other

## 2014-07-09 ENCOUNTER — Encounter: Payer: Self-pay | Admitting: Internal Medicine

## 2014-07-09 DIAGNOSIS — N2581 Secondary hyperparathyroidism of renal origin: Secondary | ICD-10-CM | POA: Diagnosis not present

## 2014-07-09 DIAGNOSIS — I129 Hypertensive chronic kidney disease with stage 1 through stage 4 chronic kidney disease, or unspecified chronic kidney disease: Secondary | ICD-10-CM | POA: Diagnosis not present

## 2014-07-09 DIAGNOSIS — N189 Chronic kidney disease, unspecified: Secondary | ICD-10-CM | POA: Diagnosis not present

## 2014-07-09 DIAGNOSIS — N183 Chronic kidney disease, stage 3 (moderate): Secondary | ICD-10-CM | POA: Diagnosis not present

## 2014-07-09 DIAGNOSIS — E1129 Type 2 diabetes mellitus with other diabetic kidney complication: Secondary | ICD-10-CM | POA: Diagnosis not present

## 2014-07-09 DIAGNOSIS — N182 Chronic kidney disease, stage 2 (mild): Secondary | ICD-10-CM | POA: Diagnosis not present

## 2014-07-09 DIAGNOSIS — D631 Anemia in chronic kidney disease: Secondary | ICD-10-CM | POA: Diagnosis not present

## 2014-07-09 NOTE — Telephone Encounter (Signed)
Appointments made  Per staff message  Webb Silversmith

## 2014-07-09 NOTE — Telephone Encounter (Signed)
Returned Advertising account executive. Confirm appointment changed to 05/04.

## 2014-07-11 ENCOUNTER — Ambulatory Visit (HOSPITAL_BASED_OUTPATIENT_CLINIC_OR_DEPARTMENT_OTHER): Payer: Medicare Other

## 2014-07-11 ENCOUNTER — Other Ambulatory Visit: Payer: Medicare Other

## 2014-07-11 VITALS — BP 132/81 | HR 70 | Temp 99.0°F

## 2014-07-11 DIAGNOSIS — C50412 Malignant neoplasm of upper-outer quadrant of left female breast: Secondary | ICD-10-CM | POA: Diagnosis not present

## 2014-07-11 DIAGNOSIS — D509 Iron deficiency anemia, unspecified: Secondary | ICD-10-CM | POA: Diagnosis not present

## 2014-07-11 DIAGNOSIS — M810 Age-related osteoporosis without current pathological fracture: Secondary | ICD-10-CM | POA: Diagnosis present

## 2014-07-11 DIAGNOSIS — M19212 Secondary osteoarthritis, left shoulder: Secondary | ICD-10-CM | POA: Diagnosis not present

## 2014-07-11 DIAGNOSIS — M167 Other unilateral secondary osteoarthritis of hip: Secondary | ICD-10-CM | POA: Diagnosis not present

## 2014-07-11 DIAGNOSIS — C50912 Malignant neoplasm of unspecified site of left female breast: Secondary | ICD-10-CM

## 2014-07-11 MED ORDER — ZOLEDRONIC ACID 4 MG/100ML IV SOLN
4.0000 mg | Freq: Once | INTRAVENOUS | Status: AC
  Administered 2014-07-11: 4 mg via INTRAVENOUS
  Filled 2014-07-11: qty 100

## 2014-07-11 MED ORDER — SODIUM CHLORIDE 0.9 % IV SOLN
Freq: Once | INTRAVENOUS | Status: AC
  Administered 2014-07-11: 12:00:00 via INTRAVENOUS

## 2014-07-11 NOTE — Patient Instructions (Signed)

## 2014-07-16 ENCOUNTER — Other Ambulatory Visit: Payer: Self-pay | Admitting: Orthopedic Surgery

## 2014-07-16 DIAGNOSIS — M1652 Unilateral post-traumatic osteoarthritis, left hip: Secondary | ICD-10-CM

## 2014-07-16 DIAGNOSIS — M25552 Pain in left hip: Secondary | ICD-10-CM

## 2014-07-16 DIAGNOSIS — M25512 Pain in left shoulder: Secondary | ICD-10-CM

## 2014-07-20 DIAGNOSIS — N390 Urinary tract infection, site not specified: Secondary | ICD-10-CM | POA: Diagnosis not present

## 2014-07-26 DIAGNOSIS — I495 Sick sinus syndrome: Secondary | ICD-10-CM | POA: Diagnosis not present

## 2014-07-26 DIAGNOSIS — Z950 Presence of cardiac pacemaker: Secondary | ICD-10-CM | POA: Diagnosis not present

## 2014-07-30 DIAGNOSIS — M1652 Unilateral post-traumatic osteoarthritis, left hip: Secondary | ICD-10-CM | POA: Diagnosis not present

## 2014-08-08 DIAGNOSIS — E119 Type 2 diabetes mellitus without complications: Secondary | ICD-10-CM | POA: Diagnosis not present

## 2014-08-08 DIAGNOSIS — H26493 Other secondary cataract, bilateral: Secondary | ICD-10-CM | POA: Diagnosis not present

## 2014-08-08 DIAGNOSIS — Z961 Presence of intraocular lens: Secondary | ICD-10-CM | POA: Diagnosis not present

## 2014-08-14 ENCOUNTER — Other Ambulatory Visit: Payer: Self-pay | Admitting: Critical Care Medicine

## 2014-08-14 ENCOUNTER — Other Ambulatory Visit: Payer: Self-pay | Admitting: Internal Medicine

## 2014-08-17 ENCOUNTER — Telehealth: Payer: Self-pay | Admitting: Critical Care Medicine

## 2014-08-17 MED ORDER — FLUTICASONE-SALMETEROL 250-50 MCG/DOSE IN AEPB: 1.0000 | INHALATION_SPRAY | Freq: Two times a day (BID) | RESPIRATORY_TRACT | Status: DC

## 2014-08-17 NOTE — Telephone Encounter (Signed)
Spoke with pt. She states [employer] is stating she needed to contact us for refills on Advair. Refills sent to her pharmacy. Nothing further needed at this time.

## 2014-08-17 NOTE — Telephone Encounter (Signed)
Received fax from [facility].  Pt is scheduled to have left hip THA on 11/28/14.  Requesting pulmonary clearance.   [name] completed form to clear pt with recommendations to have pt stay on inhalers. Form faxed back to Pollyann Savoy at [phone] and placed in scan folder.

## 2014-08-21 ENCOUNTER — Other Ambulatory Visit: Payer: Self-pay | Admitting: Internal Medicine

## 2014-08-21 DIAGNOSIS — H26493 Other secondary cataract, bilateral: Secondary | ICD-10-CM | POA: Diagnosis not present

## 2014-08-29 DIAGNOSIS — M1612 Unilateral primary osteoarthritis, left hip: Secondary | ICD-10-CM | POA: Diagnosis not present

## 2014-08-30 ENCOUNTER — Encounter: Payer: Self-pay | Admitting: Podiatry

## 2014-08-30 ENCOUNTER — Ambulatory Visit (INDEPENDENT_AMBULATORY_CARE_PROVIDER_SITE_OTHER): Payer: Medicare Other | Admitting: Podiatry

## 2014-08-30 VITALS — BP 100/53 | HR 69 | Resp 16

## 2014-08-30 DIAGNOSIS — I6523 Occlusion and stenosis of bilateral carotid arteries: Secondary | ICD-10-CM | POA: Diagnosis not present

## 2014-08-30 DIAGNOSIS — M79673 Pain in unspecified foot: Secondary | ICD-10-CM | POA: Diagnosis not present

## 2014-08-30 DIAGNOSIS — B351 Tinea unguium: Secondary | ICD-10-CM | POA: Diagnosis not present

## 2014-08-30 LAB — HM DIABETES FOOT EXAM

## 2014-08-30 NOTE — Progress Notes (Signed)
   Subjective:    Patient ID: [name], female    DOB: 02/06/1932, 81 y.o.   MRN: [id number]  HPI Comments: "I need the nails cut"  Patient states that she needed her toenails cut.     Review of Systems  Musculoskeletal: Positive for arthralgias and gait problem.  All other systems reviewed and are negative.      Objective:   Physical Exam: I have reviewed her past history medications allergies surgery social history and review of systems. Pulses are strongly palpable bilateral. Neurologic sensorium is intact for Semmes-Weinstein monofilament. Deep tendon reflexes are intact bilateral muscle strength +5 over 5 dorsiflexion plantar flexors and inverters everters all of his musculature is intact. Orthopedic evaluation and x-rays all joints distal to the ankle full range of motion without crepitus. Cutaneous evaluation of a straight supple well-hydrated use dorsum of the foot dry xerotic skin to the plantar aspect of the foot with thick yellow dystrophic onychomycotic nails painful in nature bilateral. Severe onychodystrophy hallux bilateral.        Assessment & Plan:  Assessment: Onychodystrophy hallux bilateral with pain in limb secondary onychomycosis 1 through 5 bilateral.  Plan: Debridement of nails 1 through 5 bilateral.

## 2014-08-31 DIAGNOSIS — H26492 Other secondary cataract, left eye: Secondary | ICD-10-CM | POA: Diagnosis not present

## 2014-09-04 DIAGNOSIS — M25552 Pain in left hip: Secondary | ICD-10-CM | POA: Diagnosis not present

## 2014-09-06 ENCOUNTER — Telehealth: Payer: Self-pay

## 2014-09-06 NOTE — Telephone Encounter (Signed)
Patient requesting 10 days supply of Eliquis '5mg'$  until her mail order arrives. One box of 14 tablets given.

## 2014-09-14 ENCOUNTER — Ambulatory Visit: Payer: Medicare Other | Admitting: Internal Medicine

## 2014-09-14 DIAGNOSIS — Z0289 Encounter for other administrative examinations: Secondary | ICD-10-CM

## 2014-09-28 ENCOUNTER — Telehealth: Payer: Self-pay | Admitting: Internal Medicine

## 2014-09-28 NOTE — Telephone Encounter (Signed)
ERROR.  Documenting the call   Pt called having surgery on Hip. For her Hip Replacement. States that her battery is low on her device and requests a call back to determine if its ok for her to have the surgery. Informed pt to have the office that is completing the surgery to call back and complete the clearance on her behalf per new office policy.   Pt verbalized understanding and states that she will have their office to call.

## 2014-10-05 ENCOUNTER — Encounter: Payer: Self-pay | Admitting: Internal Medicine

## 2014-10-05 ENCOUNTER — Ambulatory Visit (INDEPENDENT_AMBULATORY_CARE_PROVIDER_SITE_OTHER): Payer: Medicare Other | Admitting: Internal Medicine

## 2014-10-05 VITALS — BP 112/50 | HR 78 | Ht 65.5 in

## 2014-10-05 DIAGNOSIS — I4819 Other persistent atrial fibrillation: Secondary | ICD-10-CM

## 2014-10-05 DIAGNOSIS — I481 Persistent atrial fibrillation: Secondary | ICD-10-CM

## 2014-10-05 DIAGNOSIS — I6523 Occlusion and stenosis of bilateral carotid arteries: Secondary | ICD-10-CM | POA: Diagnosis not present

## 2014-10-05 NOTE — Patient Instructions (Addendum)
Medication Instructions:  Your physician recommends that you continue on your current medications as directed. Please refer to the Current Medication list given to you today.  Labwork: NONE  Testing/Procedures: NONE  Follow-Up: Your physician recommends that you schedule a follow-up appointment in: 1 months with the [facility] and in 12 months with Dr.Klein    Any Other Special Instructions Will Be Listed Below (If Applicable).
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
## 2014-10-08 ENCOUNTER — Ambulatory Visit: Payer: Medicare Other | Admitting: Internal Medicine

## 2014-10-12 LAB — CUP PACEART INCLINIC DEVICE CHECK
Battery Remaining Longevity: 0.25
Battery Voltage: 2.61 V
Brady Statistic RA Percent Paced: 85 %
Brady Statistic RV Percent Paced: 99 %
Date Time Interrogation Session: 20160805115336
Lead Channel Impedance Value: 478 Ohm
Lead Channel Impedance Value: 497 Ohm
Lead Channel Pacing Threshold Amplitude: 0.625 V
Lead Channel Sensing Intrinsic Amplitude: 3.5 mV
Lead Channel Setting Pacing Amplitude: 2 V
Lead Channel Setting Sensing Sensitivity: 4 mV
MDC IDC MSMT LEADCHNL RV PACING THRESHOLD PULSEWIDTH: 0.4 ms
MDC IDC PG SERIAL: 1409809
MDC IDC SET LEADCHNL RV PACING PULSEWIDTH: 0.5 ms

## 2014-10-15 ENCOUNTER — Other Ambulatory Visit: Payer: Self-pay

## 2014-10-15 ENCOUNTER — Other Ambulatory Visit: Payer: Self-pay | Admitting: Internal Medicine

## 2014-10-15 MED ORDER — POTASSIUM CHLORIDE CRYS ER 20 MEQ PO TBCR: 20.0000 meq | EXTENDED_RELEASE_TABLET | Freq: Every day | ORAL | Status: DC

## 2014-10-19 ENCOUNTER — Ambulatory Visit: Payer: Medicare Other | Admitting: Internal Medicine

## 2014-10-22 ENCOUNTER — Ambulatory Visit: Payer: Self-pay | Admitting: Orthopedic Surgery

## 2014-10-22 NOTE — Progress Notes (Signed)
Preoperative surgical orders have been place into the Epic hospital system for [name] on 10/22/2014, 12:11 PM  by Mickel Crow for surgery on 10/31/2014.  Preop Total Hip orders including Experel Injecion, IV Tylenol, and IV Decadron as long as there are no contraindications to the above medications. [name], PA-C

## 2014-10-24 ENCOUNTER — Encounter (HOSPITAL_COMMUNITY)
Admission: RE | Admit: 2014-10-24 | Discharge: 2014-10-24 | Disposition: A | Discharge status: Home / Self Care | Payer: Medicare Other | Source: Ambulatory Visit | Attending: Orthopedic Surgery | Admitting: Orthopedic Surgery

## 2014-10-24 ENCOUNTER — Ambulatory Visit (HOSPITAL_COMMUNITY)
Admission: RE | Admit: 2014-10-24 | Discharge: 2014-10-24 | Disposition: A | Discharge status: Home / Self Care | Payer: Medicare Other | Source: Ambulatory Visit | Attending: Anesthesiology | Admitting: Anesthesiology

## 2014-10-24 ENCOUNTER — Encounter (INDEPENDENT_AMBULATORY_CARE_PROVIDER_SITE_OTHER): Payer: Self-pay

## 2014-10-24 ENCOUNTER — Encounter (HOSPITAL_COMMUNITY): Payer: Self-pay

## 2014-10-24 DIAGNOSIS — Z01812 Encounter for preprocedural laboratory examination: Secondary | ICD-10-CM | POA: Insufficient documentation

## 2014-10-24 DIAGNOSIS — Z0183 Encounter for blood typing: Secondary | ICD-10-CM | POA: Insufficient documentation

## 2014-10-24 DIAGNOSIS — R9389 Abnormal findings on diagnostic imaging of other specified body structures: Secondary | ICD-10-CM

## 2014-10-24 DIAGNOSIS — M1612 Unilateral primary osteoarthritis, left hip: Secondary | ICD-10-CM | POA: Diagnosis not present

## 2014-10-24 DIAGNOSIS — E119 Type 2 diabetes mellitus without complications: Secondary | ICD-10-CM | POA: Insufficient documentation

## 2014-10-24 DIAGNOSIS — Z01818 Encounter for other preprocedural examination: Secondary | ICD-10-CM | POA: Insufficient documentation

## 2014-10-24 DIAGNOSIS — Z950 Presence of cardiac pacemaker: Secondary | ICD-10-CM | POA: Insufficient documentation

## 2014-10-24 DIAGNOSIS — I4891 Unspecified atrial fibrillation: Secondary | ICD-10-CM | POA: Diagnosis not present

## 2014-10-24 HISTORY — DX: Pneumonia, unspecified organism: J18.9

## 2014-10-24 HISTORY — DX: Unspecified asthma, uncomplicated: J45.909

## 2014-10-24 HISTORY — DX: Tremor, unspecified: R25.1

## 2014-10-24 HISTORY — DX: Pain, unspecified: R52

## 2014-10-24 HISTORY — DX: Bronchitis, not specified as acute or chronic: J40

## 2014-10-24 HISTORY — DX: Reserved for inherently not codable concepts without codable children: IMO0001

## 2014-10-24 LAB — URINALYSIS, ROUTINE W REFLEX MICROSCOPIC
Bilirubin Urine: NEGATIVE
GLUCOSE, UA: NEGATIVE mg/dL
HGB URINE DIPSTICK: NEGATIVE
KETONES UR: NEGATIVE mg/dL
Leukocytes, UA: NEGATIVE
Nitrite: NEGATIVE
PROTEIN: NEGATIVE mg/dL
Specific Gravity, Urine: 1.007 (ref 1.005–1.030)
Urobilinogen, UA: 0.2 mg/dL (ref 0.0–1.0)
pH: 5 (ref 5.0–8.0)

## 2014-10-24 LAB — APTT: APTT: 44 s — AB (ref 24–37)

## 2014-10-24 LAB — COMPREHENSIVE METABOLIC PANEL
ALBUMIN: 3.3 g/dL — AB (ref 3.5–5.0)
ALT: 14 U/L (ref 14–54)
ANION GAP: 10 (ref 5–15)
AST: 18 U/L (ref 15–41)
Alkaline Phosphatase: 72 U/L (ref 38–126)
BUN: 18 mg/dL (ref 6–20)
CHLORIDE: 102 mmol/L (ref 101–111)
CO2: 25 mmol/L (ref 22–32)
CREATININE: 0.9 mg/dL (ref 0.44–1.00)
Calcium: 9.7 mg/dL (ref 8.9–10.3)
GFR calc non Af Amer: 58 mL/min — ABNORMAL LOW (ref >60)
Glucose, Bld: 113 mg/dL — ABNORMAL HIGH (ref 65–99)
Potassium: 4.3 mmol/L (ref 3.5–5.1)
SODIUM: 137 mmol/L (ref 135–145)
Total Bilirubin: 0.4 mg/dL (ref 0.3–1.2)
Total Protein: 8.1 g/dL (ref 6.5–8.1)

## 2014-10-24 LAB — PROTIME-INR
INR: 1.48 (ref 0.00–1.49)
Prothrombin Time: 18 seconds — ABNORMAL HIGH (ref 11.6–15.2)

## 2014-10-24 LAB — CBC
HCT: 34 % — ABNORMAL LOW (ref 36.0–46.0)
Hemoglobin: 10.7 g/dL — ABNORMAL LOW (ref 12.0–15.0)
MCH: 25.8 pg — AB (ref 26.0–34.0)
MCHC: 31.5 g/dL (ref 30.0–36.0)
MCV: 81.9 fL (ref 78.0–100.0)
PLATELETS: 385 10*3/uL (ref 150–400)
RBC: 4.15 MIL/uL (ref 3.87–5.11)
RDW: 18 % — ABNORMAL HIGH (ref 11.5–15.5)
WBC: 9 10*3/uL (ref 4.0–10.5)

## 2014-10-24 LAB — SURGICAL PCR SCREEN
MRSA, PCR: NEGATIVE
Staphylococcus aureus: NEGATIVE

## 2014-10-24 NOTE — H&P (Signed)
TOTAL HIP ADMISSION H&P  Patient is admitted for left total hip arthroplasty.  Subjective:  Chief Complaint: left hip pain  HPI: [name], 81 y.o. female, has a history of pain and functional disability in the left hip(s) due to trauma and arthritis and patient has failed non-surgical conservative treatments for greater than 12 weeks to include NSAID's and/or analgesics, corticosteriod injections and activity modification.  Onset of symptoms was gradual starting 3 years ago with gradually worsening course since that time.The patient noted prior procedures of the hip to include ORIF on the left hip(s).  Patient currently rates pain in the left hip at 9 out of 10 with activity. Patient has night pain, worsening of pain with activity and weight bearing, pain that interfers with activities of daily living and pain with passive range of motion. Patient has evidence of subchondral cysts, periarticular osteophytes and joint space narrowing by imaging studies. This condition presents safety issues increasing the risk of falls. This patient has had acetabular fracture.  There is no current active infection.  Patient Active Problem List   Diagnosis Date Noted  . Chest pain, atypical 06/07/2014  . Nicotine dependence in remission 06/28/2013  . GERD (gastroesophageal reflux disease) 07/19/2012  . Depression 06/06/2012  . Secondary hyperparathyroidism 04/25/2012  . Unspecified vitamin D deficiency 04/20/2012  . Decreased bone density 04/20/2012  . Gold stage C. COPD with frequent exacerbations 12/15/2011  . Diastolic CHF, chronic 42/59/5638  . Routine health maintenance 08/27/2011  . Malignant neoplasm of female breast 02/21/2010  . History of stroke 02/21/2010  . PACEMAKER, PERMANENT 02/21/2010  . Allergic rhinitis 12/31/2009  . Carotid stenosis 08/17/2008  . Controlled diabetes mellitus type 2 with complications 75/64/3329  . Obstructive sleep apnea 06/01/2008  . Atrial fibrillation  06/01/2008  . [name] 06/01/2008  . HYPERLIPIDEMIA 12/30/2006  . Coronary atherosclerosis 12/30/2006   Past Medical History  Diagnosis Date  . Sleep apnea     associated with hypersomnia  . Amiodarone pulmonary toxicity   . Chronic renal insufficiency, stage III (moderate)     CrCl about 60 ml/min  . Diabetes   . Tobacco abuse   . Diastolic heart failure     Acute on Chronic  . Pacemaker     Permanent  . AF (atrial fibrillation)      AV ablation 9/09 [place] per Dr Ola Spurr - AV node ablation 9/11 Dr Caryl Comes  . Hyperlipidemia   . GERD (gastroesophageal reflux disease)   . [name] (coronary artery disease)     (not sure of this 11/10  . COPD (chronic obstructive pulmonary disease)     emphysema -FeV1 73% DLCO 53% 5/09  . Invasive ductal carcinoma of breast 2011    LEFT   . OA (osteoarthritis) of knee     RIGHT  . Right sided sciatica   . Sinoatrial node dysfunction   . CHF (congestive heart failure)   . Mental disorder   . Acute blood loss anemia 04/20/2012  . Depression 06/06/2012  . Hx of cardiovascular stress test     Lexiscan Myoview (09/2013):  No definite ischemia, EF 67%; low risk  . Complication of anesthesia     "psycotic episode" after hip surg - resolved  . PAD (peripheral artery disease)     w/hx right iliac/SFA stenting and left and rt leg PTA  . CVA (cerebral vascular accident)     no residual effects evident to pt   . History of skin cancer   . Eczema  Past Surgical History  Procedure Laterality Date  . Mastectomy, partial  02/03/2010    Left/Dr Rosenbower  . Cataract extraction, bilateral      with IOL/Dr Groat  . Breast lumpectomy      left breast  . Orif acetabular fracture Left 04/19/2012    Procedure: OPEN REDUCTION INTERNAL FIXATION (ORIF) ACETABULAR FRACTURE;  Surgeon: Rozanna Box, MD;  Location: [facility];  Service: Orthopedics;  Laterality: Left;  . Atrial ablation surgery      x 2 - "did not help - had to have pacemaker"  . Vascular surgery       both legs   . Cardiac catheterization    . Total knee arthroplasty Right 10/16/2013    Procedure: RIGHT TOTAL KNEE ARTHROPLASTY;  Surgeon: Gearlean Alf, MD;  Location: [facility];  Service: Orthopedics;  Laterality: Right;      Current outpatient prescriptions:  .  apixaban (ELIQUIS) 5 MG TABS tablet, Take 1 tablet (5 mg total) by mouth 2 (two) times daily., Disp: 180 tablet, Rfl: 3 .  atenolol (TENORMIN) 25 MG tablet, Take 1 tablet (25 mg total) by mouth every morning., Disp: 90 tablet, Rfl: 3 .  atorvastatin (LIPITOR) 20 MG tablet, TAKE 1 TABLET EVERY DAY, Disp: 90 tablet, Rfl: 1 .  Fluticasone-Salmeterol (ADVAIR) 250-50 MCG/DOSE AEPB, Inhale 1 puff into the lungs 2 (two) times daily., Disp: 180 each, Rfl: 1 .  hydrocortisone cream 1 %, Apply 1 application topically 2 (two) times daily., Disp: 30 g, Rfl: 0 .  letrozole (FEMARA) 2.5 MG tablet, TAKE 1 TABLET (2.5 MG TOTAL) BY MOUTH DAILY., Disp: 90 tablet, Rfl: 3 .  levalbuterol (XOPENEX HFA) 45 MCG/ACT inhaler, Inhale 2 puffs into the lungs every 6 (six) hours as needed. For shortness of breath, Disp: , Rfl:  .  metFORMIN (GLUCOPHAGE) 500 MG tablet, TAKE 1 TABLET TWICE DAILY WITH A MEAL, Disp: 180 tablet, Rfl: 3 .  mometasone (NASONEX) 50 MCG/ACT nasal spray, Place 2 sprays into the nose daily as needed (allergies). , Disp: , Rfl:  .  Multiple Vitamins-Minerals (MULTIVITAMIN WITH MINERALS) tablet, Take 1 tablet by mouth daily., Disp: , Rfl:  .  potassium chloride SA (K-DUR,KLOR-CON) 20 MEQ tablet, Take 1 tablet (20 mEq total) by mouth daily., Disp: 28 tablet, Rfl: 0 .  senna-docusate (SENOKOT-S) 8.6-50 MG per tablet, Take 1 tablet by mouth 2 (two) times daily. Stop for diarrhea or loose stools, Disp: 60 tablet, Rfl: 12 .  SPIRIVA HANDIHALER 18 MCG inhalation capsule, INHALE THE CONTENTS OF 1 CAPSULE EVERY DAY, Disp: 90 capsule, Rfl: 3 .  torsemide (DEMADEX) 10 MG tablet, Take 1 tablet (10 mg total) by mouth daily., Disp: 90 tablet, Rfl: 1 .   traMADol (ULTRAM) 50 MG tablet, Take 1/2 to 1 tablet by mouth every 6 hours as needed for pain, Disp: 20 tablet, Rfl: 0  Allergies  Allergen Reactions  . Cholestatin     RAGWEED SEASON...sneezing   . Sulfur Itching    Social History  Substance Use Topics  . Smoking status: Former Smoker -- 1.00 packs/day for 55 years    Types: Cigarettes    Quit date: 12/19/2011  . Smokeless tobacco: Never Used     Comment: started smoking at age 72--4 cigs per day  . Alcohol Use: Yes     Comment: wine occasionally    Family History  Problem Relation Age of Onset  . Heart disease Father      Review of Systems  Constitutional: Positive  for chills. Negative for fever, weight loss, malaise/fatigue and diaphoresis.  HENT: Negative.   Eyes: Negative.   Respiratory: Positive for shortness of breath. Negative for cough, hemoptysis, sputum production and wheezing.   Cardiovascular: Negative.   Gastrointestinal: Positive for constipation. Negative for heartburn, nausea, vomiting, abdominal pain, diarrhea, blood in stool and melena.  Genitourinary: Negative.   Musculoskeletal: Positive for joint pain. Negative for myalgias, back pain, falls and neck pain.  Skin: Negative.   Neurological: Negative.  Negative for weakness.  Endo/Heme/Allergies: Negative.   Psychiatric/Behavioral: Negative.     Objective:  Physical Exam  Constitutional: She is oriented to person, place, and time. She appears well-developed and well-nourished. No distress.  HENT:  Head: Normocephalic and atraumatic.  Right Ear: External ear normal.  Left Ear: External ear normal.  Nose: Nose normal.  Mouth/Throat: Oropharynx is clear and moist.  Eyes: Conjunctivae and EOM are normal.  Neck: Normal range of motion. Neck supple.  Cardiovascular: Normal rate, regular rhythm, normal heart sounds and intact distal pulses.   No murmur heard. Respiratory: Effort normal and breath sounds normal. No respiratory distress. She has no  wheezes.  GI: Soft. She exhibits no distension. There is no tenderness.  Musculoskeletal:       Right hip: Normal.       Left hip: She exhibits decreased range of motion.       Right knee: Normal.       Left knee: Normal.  Right hip normal range of motion. No discomfort. Left hip flexion about 100, no internal rotation, about 20 external rotation, and 20 abduction. She is slightly tender on the lateral aspect of the left hip. There is no tenderness about her knee.  Neurological: She is alert and oriented to person, place, and time. She has normal strength. No sensory deficit.  Skin: No rash noted. She is not diaphoretic. No erythema.  Psychiatric: She has a normal mood and affect. Her behavior is normal.     Vitals Weight: 175 lb Height: 65.5in Body Surface Area: 1.88 m Body Mass Index: 28.68 kg/m  Pulse: 72 (Regular)  BP: 114/72 (Sitting, Left Arm, Standard)  Imaging Review Plain radiographs demonstrate severe degenerative joint disease of the left hip(s). The bone quality appears to be fair for age and reported activity level. She has evidence of two plates from previous fixation in the acetabulum, which are in excellent position. The acetabular appears healed; however, she is now bone-on-bone in the left hip with subchondral sclerosis and some loss of the normal contour of the femoral head with subchondral cystic changes.  Assessment/Plan:  End stage post-traumatic osteoarthritis, left hip(s)  The patient history, physical examination, clinical judgement of the provider and imaging studies are consistent with end stage degenerative joint disease of the left hip(s) and total hip arthroplasty is deemed medically necessary. The treatment options including medical management, injection therapy, arthroscopy and arthroplasty were discussed at length. The risks and benefits of total hip arthroplasty were presented and reviewed. The risks due to aseptic loosening, infection,  stiffness, dislocation/subluxation,  thromboembolic complications and other imponderables were discussed.  The patient acknowledged the explanation, agreed to proceed with the plan and consent was signed. Patient is being admitted for inpatient treatment for surgery, pain control, PT, OT, prophylactic antibiotics, VTE prophylaxis, progressive ambulation and ADL's and discharge planning.The patient is planning to be discharged home vs SNF depending on progress while inpatient    Topical TXA Cardio: Dr. Caryl Comes PCP: Dr. Marva Panda,  PA-C

## 2014-10-24 NOTE — Progress Notes (Addendum)
PT/PTT results / epic per PAT visit 10/24/2014 sent to Dr Wynelle Link

## 2014-10-24 NOTE — Progress Notes (Signed)
Spoke with Dr Kalman Shan / anesthesia in regards to pts H&P an if needs to hold Pletal medication. Pt instructed to hold Pletal morning of surgery/anethesia. Pt verbalized understanding. Instruction also placed on instruction sheet.

## 2014-10-24 NOTE — Patient Instructions (Addendum)
[name]  10/24/2014   Your procedure is scheduled on: Wednesday October 31, 2014  Report to [facility]  Entrance take [place]  elevators to 3rd floor to  [place] at 7:30 AM.  Call this number if you have problems the morning of surgery [phone]   Remember: ONLY 1 PERSON MAY GO WITH YOU TO SHORT STAY TO GET  READY MORNING OF [place].  Do not eat food or drink liquids :After Midnight.     Take these medicines the morning of surgery with A SIP OF WATER: Atenolol (Tenormin);Advair Inhaler; Xopenex; Nasonex; Spiriva (bring Inhalers with you day of surgery)             HOLD ELIQUIS 36 HOURS PRIOR TO SURGERY PER DR KLEIN'S INSTRUCTION; ALSO HOLD CILOSTAZOL (Pletal) morning of surgery              BRING CPAP MASK AND TUBING DAY OF SURGERY                               You may not have any metal on your body including hair pins and              piercings  Do not wear jewelry, make-up, lotions, powders or perfumes, deodorant             Do not wear nail polish.  Do not shave  48 hours prior to surgery.                Do not bring valuables to the hospital. [place].  Contacts, dentures or bridgework may not be worn into surgery.  Leave suitcase in the car. After surgery it may be brought to your room.                  Please read over the following fact sheets you were given:MRSA INFORMATION SHEET; INCENTIVE SPIROMETER; BLOOD TRANSFUSION INFORMATION SHEET _____________________________________________________________________             [facility] - Preparing for Surgery Before surgery, you can play an important role.  Because skin is not sterile, your skin needs to be as free of germs as possible.  You can reduce the number of germs on your skin by washing with CHG (chlorahexidine gluconate) soap before surgery.  CHG is an antiseptic cleaner which kills germs and bonds with the skin to  continue killing germs even after washing. Please DO NOT use if you have an allergy to CHG or antibacterial soaps.  If your skin becomes reddened/irritated stop using the CHG and inform your nurse when you arrive at Short Stay. Do not shave (including legs and underarms) for at least 48 hours prior to the first CHG shower.  You may shave your face/neck. Please follow these instructions carefully:  1.  Shower with CHG Soap the night before surgery and the  morning of Surgery.  2.  If you choose to wash your hair, wash your hair first as usual with your  normal  shampoo.  3.  After you shampoo, rinse your hair and body thoroughly to remove the  shampoo.  4.  Use CHG as you would any other liquid soap.  You can apply chg directly  to the skin and wash                       Gently with a scrungie or clean washcloth.  5.  Apply the CHG Soap to your body ONLY FROM THE NECK DOWN.   Do not use on face/ open                           Wound or open sores. Avoid contact with eyes, ears mouth and genitals (private parts).                       Wash face,  Genitals (private parts) with your normal soap.             6.  Wash thoroughly, paying special attention to the area where your surgery  will be performed.  7.  Thoroughly rinse your body with warm water from the neck down.  8.  DO NOT shower/wash with your normal soap after using and rinsing off  the CHG Soap.                9.  Pat yourself dry with a clean towel.            10.  Wear clean pajamas.            11.  Place clean sheets on your bed the night of your first shower and do not  sleep with pets. Day of Surgery : Do not apply any lotions/deodorants the morning of surgery.  Please wear clean clothes to the hospital/surgery center.  FAILURE TO FOLLOW THESE INSTRUCTIONS MAY RESULT IN THE CANCELLATION OF YOUR SURGERY PATIENT SIGNATURE_________________________________  NURSE  SIGNATURE__________________________________  ________________________________________________________________________   [name]  An incentive spirometer is a tool that can help keep your lungs clear and active. This tool measures how well you are filling your lungs with each breath. Taking long deep breaths may help reverse or decrease the chance of developing breathing (pulmonary) problems (especially infection) following:  A long period of time when you are unable to move or be active. BEFORE THE PROCEDURE   If the spirometer includes an indicator to show your best effort, your nurse or respiratory therapist will set it to a desired goal.  If possible, sit up straight or lean slightly forward. Try not to slouch.  Hold the incentive spirometer in an upright position. INSTRUCTIONS FOR USE   Sit on the edge of your bed if possible, or sit up as far as you can in bed or on a chair.  Hold the incentive spirometer in an upright position.  Breathe out normally.  Place the mouthpiece in your mouth and seal your lips tightly around it.  Breathe in slowly and as deeply as possible, raising the piston or the ball toward the top of the column.  Hold your breath for 3-5 seconds or for as long as possible. Allow the piston or ball to fall to the bottom of the column.  Remove the mouthpiece from your mouth and breathe out normally.  Rest for a few seconds and repeat Steps 1 through 7 at least 10 times every 1-2 hours when you are awake. Take your time and take a few normal breaths between deep breaths.  The spirometer may include an indicator to show  your best effort. Use the indicator as a goal to work toward during each repetition.  After each set of 10 deep breaths, practice coughing to be sure your lungs are clear. If you have an incision (the cut made at the time of surgery), support your incision when coughing by placing a pillow or rolled up towels firmly against it. Once  you are able to get out of bed, walk around indoors and cough well. You may stop using the incentive spirometer when instructed by your caregiver.  RISKS AND COMPLICATIONS  Take your time so you do not get dizzy or light-headed.  If you are in pain, you may need to take or ask for pain medication before doing incentive spirometry. It is harder to take a deep breath if you are having pain. AFTER USE  Rest and breathe slowly and easily.  It can be helpful to keep track of a log of your progress. Your caregiver can provide you with a simple table to help with this. If you are using the spirometer at home, follow these instructions: [place] IF:   You are having difficultly using the spirometer.  You have trouble using the spirometer as often as instructed.  Your pain medication is not giving enough relief while using the spirometer.  You develop fever of 100.5 F (38.1 C) or higher. SEEK IMMEDIATE MEDICAL CARE IF:   You cough up bloody sputum that had not been present before.  You develop fever of 102 F (38.9 C) or greater.  You develop worsening pain at or near the incision site. MAKE SURE YOU:   Understand these instructions.  Will watch your condition.  Will get help right away if you are not doing well or get worse. Document Released: 07/06/2006 Document Revised: 05/18/2011 Document Reviewed: 09/06/2006 ExitCare Patient Information 2014 Exit[employer], [place].   ________________________________________________________________________  WHAT IS A BLOOD TRANSFUSION? Blood Transfusion Information  A transfusion is the replacement of blood or some of its parts. Blood is made up of multiple cells which provide different functions.  Red blood cells carry oxygen and are used for blood loss replacement.  White blood cells fight against infection.  Platelets control bleeding.  Plasma helps clot blood.  Other blood products are available for specialized needs, such as  hemophilia or other clotting disorders. BEFORE THE TRANSFUSION  Who gives blood for transfusions?   Healthy volunteers who are fully evaluated to make sure their blood is safe. This is blood bank blood. Transfusion therapy is the safest it has ever been in the practice of medicine. Before blood is taken from a donor, a complete history is taken to make sure that person has no history of diseases nor engages in risky social behavior (examples are intravenous drug use or sexual activity with multiple partners). The donor's travel history is screened to minimize risk of transmitting infections, such as malaria. The donated blood is tested for signs of infectious diseases, such as HIV and hepatitis. The blood is then tested to be sure it is compatible with you in order to minimize the chance of a transfusion reaction. If you or a relative donates blood, this is often done in anticipation of surgery and is not appropriate for emergency situations. It takes many days to process the donated blood. RISKS AND COMPLICATIONS Although transfusion therapy is very safe and saves many lives, the main dangers of transfusion include:   Getting an infectious disease.  Developing a transfusion reaction. This is an allergic reaction to  something in the blood you were given. Every precaution is taken to prevent this. The decision to have a blood transfusion has been considered carefully by your caregiver before blood is given. Blood is not given unless the benefits outweigh the risks. AFTER THE TRANSFUSION  Right after receiving a blood transfusion, you will usually feel much better and more energetic. This is especially true if your red blood cells have gotten low (anemic). The transfusion raises the level of the red blood cells which carry oxygen, and this usually causes an energy increase.  The nurse administering the transfusion will monitor you carefully for complications. HOME CARE INSTRUCTIONS  No special  instructions are needed after a transfusion. You may find your energy is better. Speak with your caregiver about any limitations on activity for underlying diseases you may have. SEEK MEDICAL CARE IF:   Your condition is not improving after your transfusion.  You develop redness or irritation at the intravenous (IV) site. SEEK IMMEDIATE MEDICAL CARE IF:  Any of the following symptoms occur over the next 12 hours:  Shaking chills.  You have a temperature by mouth above 102 F (38.9 C), not controlled by medicine.  Chest, back, or muscle pain.  People around you feel you are not acting correctly or are confused.  Shortness of breath or difficulty breathing.  Dizziness and fainting.  You get a rash or develop hives.  You have a decrease in urine output.  Your urine turns a dark color or changes to pink, red, or brown. Any of the following symptoms occur over the next 10 days:  You have a temperature by mouth above 102 F (38.9 C), not controlled by medicine.  Shortness of breath.  Weakness after normal activity.  The white part of the eye turns yellow (jaundice).  You have a decrease in the amount of urine or are urinating less often.  Your urine turns a dark color or changes to pink, red, or brown. Document Released: 02/21/2000 Document Revised: 05/18/2011 Document Reviewed: 10/10/2007 [facility] Patient Information 2014 [place], [place].  _______________________________________________________________________

## 2014-10-24 NOTE — Progress Notes (Addendum)
Clearance note per chart per Dr Cliffton Asters Clearance note per epic per Dr Klein/cardiology 10/05/2014  EKG epic 03/16/2014 Stress Test epic 09/06/2013 ECHO/epic 09/27/2013

## 2014-10-25 DIAGNOSIS — Z950 Presence of cardiac pacemaker: Secondary | ICD-10-CM | POA: Diagnosis not present

## 2014-10-31 ENCOUNTER — Inpatient Hospital Stay (HOSPITAL_COMMUNITY): Payer: Medicare Other | Admitting: Anesthesiology

## 2014-10-31 ENCOUNTER — Inpatient Hospital Stay (HOSPITAL_COMMUNITY)
Admission: RE | Admit: 2014-10-31 | Discharge: 2014-11-03 | DRG: 470 | Disposition: A | Discharge status: Skilled Nursing Facility | Payer: Medicare Other | Source: Ambulatory Visit | Attending: Orthopedic Surgery | Admitting: Orthopedic Surgery

## 2014-10-31 ENCOUNTER — Encounter (HOSPITAL_COMMUNITY)
Admission: RE | Disposition: A | Discharge status: Skilled Nursing Facility | Payer: Self-pay | Source: Ambulatory Visit | Attending: Orthopedic Surgery

## 2014-10-31 ENCOUNTER — Encounter (HOSPITAL_COMMUNITY): Payer: Self-pay | Admitting: *Deleted

## 2014-10-31 ENCOUNTER — Inpatient Hospital Stay (HOSPITAL_COMMUNITY): Payer: Medicare Other

## 2014-10-31 DIAGNOSIS — Z96642 Presence of left artificial hip joint: Secondary | ICD-10-CM | POA: Diagnosis not present

## 2014-10-31 DIAGNOSIS — Z8249 Family history of ischemic heart disease and other diseases of the circulatory system: Secondary | ICD-10-CM | POA: Diagnosis not present

## 2014-10-31 DIAGNOSIS — Z8673 Personal history of transient ischemic attack (TIA), and cerebral infarction without residual deficits: Secondary | ICD-10-CM

## 2014-10-31 DIAGNOSIS — M1652 Unilateral post-traumatic osteoarthritis, left hip: Secondary | ICD-10-CM | POA: Diagnosis not present

## 2014-10-31 DIAGNOSIS — K219 Gastro-esophageal reflux disease without esophagitis: Secondary | ICD-10-CM | POA: Diagnosis present

## 2014-10-31 DIAGNOSIS — I739 Peripheral vascular disease, unspecified: Secondary | ICD-10-CM | POA: Diagnosis present

## 2014-10-31 DIAGNOSIS — I5032 Chronic diastolic (congestive) heart failure: Secondary | ICD-10-CM | POA: Diagnosis present

## 2014-10-31 DIAGNOSIS — M6281 Muscle weakness (generalized): Secondary | ICD-10-CM | POA: Diagnosis not present

## 2014-10-31 DIAGNOSIS — Z87891 Personal history of nicotine dependence: Secondary | ICD-10-CM

## 2014-10-31 DIAGNOSIS — E559 Vitamin D deficiency, unspecified: Secondary | ICD-10-CM | POA: Diagnosis not present

## 2014-10-31 DIAGNOSIS — Z853 Personal history of malignant neoplasm of breast: Secondary | ICD-10-CM | POA: Diagnosis not present

## 2014-10-31 DIAGNOSIS — N183 Chronic kidney disease, stage 3 (moderate): Secondary | ICD-10-CM | POA: Diagnosis present

## 2014-10-31 DIAGNOSIS — M25552 Pain in left hip: Secondary | ICD-10-CM | POA: Diagnosis present

## 2014-10-31 DIAGNOSIS — E785 Hyperlipidemia, unspecified: Secondary | ICD-10-CM | POA: Diagnosis present

## 2014-10-31 DIAGNOSIS — R2681 Unsteadiness on feet: Secondary | ICD-10-CM | POA: Diagnosis not present

## 2014-10-31 DIAGNOSIS — G4733 Obstructive sleep apnea (adult) (pediatric): Secondary | ICD-10-CM | POA: Diagnosis present

## 2014-10-31 DIAGNOSIS — Z471 Aftercare following joint replacement surgery: Secondary | ICD-10-CM | POA: Diagnosis not present

## 2014-10-31 DIAGNOSIS — I509 Heart failure, unspecified: Secondary | ICD-10-CM | POA: Diagnosis not present

## 2014-10-31 DIAGNOSIS — M169 Osteoarthritis of hip, unspecified: Secondary | ICD-10-CM | POA: Diagnosis present

## 2014-10-31 DIAGNOSIS — M1612 Unilateral primary osteoarthritis, left hip: Secondary | ICD-10-CM | POA: Diagnosis not present

## 2014-10-31 DIAGNOSIS — Z96652 Presence of left artificial knee joint: Secondary | ICD-10-CM | POA: Diagnosis present

## 2014-10-31 DIAGNOSIS — Z950 Presence of cardiac pacemaker: Secondary | ICD-10-CM | POA: Diagnosis not present

## 2014-10-31 DIAGNOSIS — Z79899 Other long term (current) drug therapy: Secondary | ICD-10-CM | POA: Diagnosis not present

## 2014-10-31 DIAGNOSIS — I4891 Unspecified atrial fibrillation: Secondary | ICD-10-CM | POA: Diagnosis not present

## 2014-10-31 DIAGNOSIS — R278 Other lack of coordination: Secondary | ICD-10-CM | POA: Diagnosis not present

## 2014-10-31 DIAGNOSIS — F329 Major depressive disorder, single episode, unspecified: Secondary | ICD-10-CM | POA: Diagnosis present

## 2014-10-31 DIAGNOSIS — E119 Type 2 diabetes mellitus without complications: Secondary | ICD-10-CM | POA: Diagnosis present

## 2014-10-31 DIAGNOSIS — J449 Chronic obstructive pulmonary disease, unspecified: Secondary | ICD-10-CM

## 2014-10-31 DIAGNOSIS — R531 Weakness: Secondary | ICD-10-CM | POA: Diagnosis not present

## 2014-10-31 DIAGNOSIS — Z96649 Presence of unspecified artificial hip joint: Secondary | ICD-10-CM

## 2014-10-31 DIAGNOSIS — I259 Chronic ischemic heart disease, unspecified: Secondary | ICD-10-CM | POA: Diagnosis not present

## 2014-10-31 DIAGNOSIS — K210 Gastro-esophageal reflux disease with esophagitis: Secondary | ICD-10-CM | POA: Diagnosis not present

## 2014-10-31 DIAGNOSIS — E118 Type 2 diabetes mellitus with unspecified complications: Secondary | ICD-10-CM | POA: Diagnosis not present

## 2014-10-31 HISTORY — PX: TOTAL HIP ARTHROPLASTY: SHX124

## 2014-10-31 LAB — GLUCOSE, CAPILLARY
GLUCOSE-CAPILLARY: 179 mg/dL — AB (ref 65–99)
GLUCOSE-CAPILLARY: 222 mg/dL — AB (ref 65–99)
Glucose-Capillary: 129 mg/dL — ABNORMAL HIGH (ref 65–99)
Glucose-Capillary: 197 mg/dL — ABNORMAL HIGH (ref 65–99)

## 2014-10-31 LAB — TYPE AND SCREEN
ABO/RH(D): A POS
Antibody Screen: NEGATIVE

## 2014-10-31 SURGERY — ARTHROPLASTY, HIP, TOTAL, ANTERIOR APPROACH
Anesthesia: General | Site: Hip | Laterality: Left

## 2014-10-31 MED ORDER — ACETAMINOPHEN 10 MG/ML IV SOLN
1000.0000 mg | Freq: Once | INTRAVENOUS | Status: AC
  Administered 2014-10-31: 1000 mg via INTRAVENOUS

## 2014-10-31 MED ORDER — CEFAZOLIN SODIUM-DEXTROSE 2-3 GM-% IV SOLR
INTRAVENOUS | Status: AC
  Filled 2014-10-31: qty 50

## 2014-10-31 MED ORDER — METHOCARBAMOL 1000 MG/10ML IJ SOLN
500.0000 mg | Freq: Four times a day (QID) | INTRAVENOUS | Status: DC | PRN
  Administered 2014-10-31: 500 mg via INTRAVENOUS
  Filled 2014-10-31 (×2): qty 5

## 2014-10-31 MED ORDER — METFORMIN HCL 500 MG PO TABS
500.0000 mg | ORAL_TABLET | Freq: Two times a day (BID) | ORAL | Status: DC
  Filled 2014-10-31 (×3): qty 1

## 2014-10-31 MED ORDER — METOCLOPRAMIDE HCL 5 MG/ML IJ SOLN: 5.0000 mg | Freq: Three times a day (TID) | INTRAMUSCULAR | Status: DC | PRN

## 2014-10-31 MED ORDER — METHOCARBAMOL 500 MG PO TABS
500.0000 mg | ORAL_TABLET | Freq: Four times a day (QID) | ORAL | Status: DC | PRN
  Administered 2014-11-03: 500 mg via ORAL
  Filled 2014-10-31: qty 1

## 2014-10-31 MED ORDER — OXYCODONE HCL 5 MG PO TABS
5.0000 mg | ORAL_TABLET | ORAL | Status: DC | PRN
Start: 2014-10-31 — End: 2014-11-03
  Administered 2014-10-31 – 2014-11-01 (×3): 10 mg via ORAL
  Administered 2014-11-03: 5 mg via ORAL
  Filled 2014-10-31 (×4): qty 2

## 2014-10-31 MED ORDER — DEXAMETHASONE SODIUM PHOSPHATE 10 MG/ML IJ SOLN
INTRAMUSCULAR | Status: AC
  Filled 2014-10-31: qty 1

## 2014-10-31 MED ORDER — CHLORHEXIDINE GLUCONATE 4 % EX LIQD: 60.0000 mL | Freq: Once | CUTANEOUS | Status: DC

## 2014-10-31 MED ORDER — PROPOFOL 10 MG/ML IV BOLUS
INTRAVENOUS | Status: AC
  Filled 2014-10-31: qty 20

## 2014-10-31 MED ORDER — PROPOFOL 10 MG/ML IV BOLUS
INTRAVENOUS | Status: DC | PRN
  Administered 2014-10-31: 100 mg via INTRAVENOUS

## 2014-10-31 MED ORDER — TRAMADOL HCL 50 MG PO TABS
50.0000 mg | ORAL_TABLET | Freq: Four times a day (QID) | ORAL | Status: DC | PRN
  Administered 2014-11-01 – 2014-11-02 (×5): 100 mg via ORAL
  Filled 2014-10-31 (×5): qty 2

## 2014-10-31 MED ORDER — FENTANYL CITRATE (PF) 100 MCG/2ML IJ SOLN
INTRAMUSCULAR | Status: DC | PRN
  Administered 2014-10-31 (×4): 25 ug via INTRAVENOUS
  Administered 2014-10-31 (×2): 50 ug via INTRAVENOUS
  Administered 2014-10-31: 25 ug via INTRAVENOUS

## 2014-10-31 MED ORDER — DIPHENHYDRAMINE HCL 12.5 MG/5ML PO ELIX: 12.5000 mg | ORAL_SOLUTION | ORAL | Status: DC | PRN

## 2014-10-31 MED ORDER — DOCUSATE SODIUM 100 MG PO CAPS
100.0000 mg | ORAL_CAPSULE | Freq: Two times a day (BID) | ORAL | Status: DC
  Administered 2014-10-31 – 2014-11-03 (×6): 100 mg via ORAL

## 2014-10-31 MED ORDER — ALBUTEROL SULFATE (2.5 MG/3ML) 0.083% IN NEBU: 3.0000 mL | INHALATION_SOLUTION | Freq: Four times a day (QID) | RESPIRATORY_TRACT | Status: DC | PRN

## 2014-10-31 MED ORDER — MOMETASONE FURO-FORMOTEROL FUM 100-5 MCG/ACT IN AERO
2.0000 | INHALATION_SPRAY | Freq: Two times a day (BID) | RESPIRATORY_TRACT | Status: DC
  Administered 2014-10-31 – 2014-11-03 (×6): 2 via RESPIRATORY_TRACT
  Filled 2014-10-31: qty 8.8

## 2014-10-31 MED ORDER — ATORVASTATIN CALCIUM 20 MG PO TABS
20.0000 mg | ORAL_TABLET | Freq: Every day | ORAL | Status: DC
  Administered 2014-10-31 – 2014-11-03 (×4): 20 mg via ORAL
  Filled 2014-10-31 (×4): qty 1

## 2014-10-31 MED ORDER — BUPIVACAINE HCL (PF) 0.25 % IJ SOLN
INTRAMUSCULAR | Status: DC | PRN
  Administered 2014-10-31: 30 mL

## 2014-10-31 MED ORDER — FENTANYL CITRATE (PF) 250 MCG/5ML IJ SOLN
INTRAMUSCULAR | Status: AC
  Filled 2014-10-31: qty 25

## 2014-10-31 MED ORDER — ACETAMINOPHEN 500 MG PO TABS
1000.0000 mg | ORAL_TABLET | Freq: Four times a day (QID) | ORAL | Status: AC
  Administered 2014-10-31 – 2014-11-01 (×3): 1000 mg via ORAL
  Filled 2014-10-31 (×4): qty 2

## 2014-10-31 MED ORDER — CILOSTAZOL 100 MG PO TABS
100.0000 mg | ORAL_TABLET | Freq: Two times a day (BID) | ORAL | Status: DC
  Administered 2014-10-31 – 2014-11-03 (×6): 100 mg via ORAL
  Filled 2014-10-31 (×7): qty 1

## 2014-10-31 MED ORDER — ONDANSETRON HCL 4 MG/2ML IJ SOLN
INTRAMUSCULAR | Status: AC
  Filled 2014-10-31: qty 2

## 2014-10-31 MED ORDER — POTASSIUM CHLORIDE CRYS ER 20 MEQ PO TBCR
40.0000 meq | EXTENDED_RELEASE_TABLET | Freq: Two times a day (BID) | ORAL | Status: DC
  Administered 2014-10-31 – 2014-11-03 (×6): 40 meq via ORAL
  Filled 2014-10-31 (×7): qty 2

## 2014-10-31 MED ORDER — MENTHOL 3 MG MT LOZG
1.0000 | LOZENGE | OROMUCOSAL | Status: DC | PRN
  Filled 2014-10-31: qty 9

## 2014-10-31 MED ORDER — MIDAZOLAM HCL 2 MG/2ML IJ SOLN
INTRAMUSCULAR | Status: AC
  Filled 2014-10-31: qty 4

## 2014-10-31 MED ORDER — LIDOCAINE HCL (CARDIAC) 20 MG/ML IV SOLN
INTRAVENOUS | Status: DC | PRN
  Administered 2014-10-31: 40 mg via INTRAVENOUS

## 2014-10-31 MED ORDER — METOCLOPRAMIDE HCL 10 MG PO TABS: 5.0000 mg | ORAL_TABLET | Freq: Three times a day (TID) | ORAL | Status: DC | PRN

## 2014-10-31 MED ORDER — SODIUM CHLORIDE 0.9 % IR SOLN
Status: DC | PRN
  Administered 2014-10-31: 1000 mL

## 2014-10-31 MED ORDER — ATENOLOL 25 MG PO TABS
25.0000 mg | ORAL_TABLET | Freq: Every morning | ORAL | Status: DC
  Administered 2014-11-01 – 2014-11-03 (×3): 25 mg via ORAL
  Filled 2014-10-31 (×3): qty 1

## 2014-10-31 MED ORDER — TIOTROPIUM BROMIDE MONOHYDRATE 18 MCG IN CAPS
18.0000 ug | ORAL_CAPSULE | Freq: Every day | RESPIRATORY_TRACT | Status: DC
  Administered 2014-11-01 – 2014-11-03 (×3): 18 ug via RESPIRATORY_TRACT
  Filled 2014-10-31: qty 5

## 2014-10-31 MED ORDER — NEOSTIGMINE METHYLSULFATE 10 MG/10ML IV SOLN
INTRAVENOUS | Status: DC | PRN
  Administered 2014-10-31: 3 mg via INTRAVENOUS

## 2014-10-31 MED ORDER — FLEET ENEMA 7-19 GM/118ML RE ENEM: 1.0000 | ENEMA | Freq: Once | RECTAL | Status: DC | PRN

## 2014-10-31 MED ORDER — CEFAZOLIN SODIUM-DEXTROSE 2-3 GM-% IV SOLR
2.0000 g | INTRAVENOUS | Status: AC
  Administered 2014-10-31: 2 g via INTRAVENOUS

## 2014-10-31 MED ORDER — ACETAMINOPHEN 325 MG PO TABS: 650.0000 mg | ORAL_TABLET | Freq: Four times a day (QID) | ORAL | Status: DC | PRN

## 2014-10-31 MED ORDER — BUPIVACAINE HCL (PF) 0.25 % IJ SOLN
INTRAMUSCULAR | Status: AC
  Filled 2014-10-31: qty 30

## 2014-10-31 MED ORDER — GLYCOPYRROLATE 0.2 MG/ML IJ SOLN
INTRAMUSCULAR | Status: DC | PRN
  Administered 2014-10-31: 0.4 mg via INTRAVENOUS

## 2014-10-31 MED ORDER — ACETAMINOPHEN 10 MG/ML IV SOLN
INTRAVENOUS | Status: AC
Start: 2014-10-31 — End: 2014-10-31
  Filled 2014-10-31: qty 100

## 2014-10-31 MED ORDER — LIDOCAINE HCL (CARDIAC) 20 MG/ML IV SOLN
INTRAVENOUS | Status: AC
  Filled 2014-10-31: qty 5

## 2014-10-31 MED ORDER — TORSEMIDE 10 MG PO TABS
10.0000 mg | ORAL_TABLET | Freq: Every day | ORAL | Status: DC
  Administered 2014-11-01 – 2014-11-03 (×3): 10 mg via ORAL
  Filled 2014-10-31 (×3): qty 1

## 2014-10-31 MED ORDER — ACETAMINOPHEN 500 MG PO TABS: 500.0000 mg | ORAL_TABLET | Freq: Four times a day (QID) | ORAL | Status: DC | PRN

## 2014-10-31 MED ORDER — ONDANSETRON HCL 4 MG/2ML IJ SOLN
INTRAMUSCULAR | Status: DC | PRN
  Administered 2014-10-31: 4 mg via INTRAVENOUS

## 2014-10-31 MED ORDER — ONDANSETRON HCL 4 MG/2ML IJ SOLN: 4.0000 mg | Freq: Four times a day (QID) | INTRAMUSCULAR | Status: DC | PRN

## 2014-10-31 MED ORDER — TRANEXAMIC ACID 1000 MG/10ML IV SOLN
2000.0000 mg | Freq: Once | INTRAVENOUS | Status: AC
  Administered 2014-10-31: 2000 mg via TOPICAL
  Filled 2014-10-31: qty 20

## 2014-10-31 MED ORDER — ACETAMINOPHEN 650 MG RE SUPP: 650.0000 mg | Freq: Four times a day (QID) | RECTAL | Status: DC | PRN

## 2014-10-31 MED ORDER — POLYETHYLENE GLYCOL 3350 17 G PO PACK
17.0000 g | PACK | Freq: Every day | ORAL | Status: DC | PRN
  Administered 2014-11-02: 17 g via ORAL
  Filled 2014-10-31: qty 1

## 2014-10-31 MED ORDER — HYDROMORPHONE HCL 1 MG/ML IJ SOLN
0.2500 mg | INTRAMUSCULAR | Status: DC | PRN
  Administered 2014-10-31 (×4): 0.25 mg via INTRAVENOUS

## 2014-10-31 MED ORDER — ONDANSETRON HCL 4 MG PO TABS: 4.0000 mg | ORAL_TABLET | Freq: Four times a day (QID) | ORAL | Status: DC | PRN

## 2014-10-31 MED ORDER — CEFAZOLIN SODIUM-DEXTROSE 2-3 GM-% IV SOLR
2.0000 g | Freq: Four times a day (QID) | INTRAVENOUS | Status: AC
  Administered 2014-10-31 (×2): 2 g via INTRAVENOUS
  Filled 2014-10-31 (×3): qty 50

## 2014-10-31 MED ORDER — LACTATED RINGERS IV SOLN
INTRAVENOUS | Status: DC | PRN
  Administered 2014-10-31 (×2): via INTRAVENOUS

## 2014-10-31 MED ORDER — HYDROMORPHONE HCL 1 MG/ML IJ SOLN
INTRAMUSCULAR | Status: AC
  Filled 2014-10-31: qty 1

## 2014-10-31 MED ORDER — DEXAMETHASONE SODIUM PHOSPHATE 10 MG/ML IJ SOLN
10.0000 mg | Freq: Once | INTRAMUSCULAR | Status: DC
  Filled 2014-10-31: qty 1

## 2014-10-31 MED ORDER — ROCURONIUM BROMIDE 100 MG/10ML IV SOLN
INTRAVENOUS | Status: DC | PRN
  Administered 2014-10-31: 40 mg via INTRAVENOUS

## 2014-10-31 MED ORDER — BISACODYL 10 MG RE SUPP: 10.0000 mg | Freq: Every day | RECTAL | Status: DC | PRN

## 2014-10-31 MED ORDER — APIXABAN 2.5 MG PO TABS
2.5000 mg | ORAL_TABLET | Freq: Two times a day (BID) | ORAL | Status: DC
  Administered 2014-11-01 – 2014-11-03 (×5): 2.5 mg via ORAL
  Filled 2014-10-31 (×8): qty 1

## 2014-10-31 MED ORDER — FLUTICASONE PROPIONATE 50 MCG/ACT NA SUSP
1.0000 | Freq: Every day | NASAL | Status: DC | PRN
  Filled 2014-10-31: qty 16

## 2014-10-31 MED ORDER — SODIUM CHLORIDE 0.9 % IV SOLN: INTRAVENOUS | Status: DC

## 2014-10-31 MED ORDER — MORPHINE SULFATE (PF) 2 MG/ML IV SOLN: 1.0000 mg | INTRAVENOUS | Status: DC | PRN

## 2014-10-31 MED ORDER — PHENOL 1.4 % MT LIQD: 1.0000 | OROMUCOSAL | Status: DC | PRN

## 2014-10-31 MED ORDER — SODIUM CHLORIDE 0.9 % IV SOLN
INTRAVENOUS | Status: DC
  Administered 2014-10-31 – 2014-11-01 (×2): via INTRAVENOUS

## 2014-10-31 MED ORDER — INSULIN ASPART 100 UNIT/ML ~~LOC~~ SOLN
0.0000 [IU] | Freq: Three times a day (TID) | SUBCUTANEOUS | Status: DC
  Administered 2014-10-31: 3 [IU] via SUBCUTANEOUS
  Administered 2014-11-01: 5 [IU] via SUBCUTANEOUS
  Administered 2014-11-01: 3 [IU] via SUBCUTANEOUS
  Administered 2014-11-02 – 2014-11-03 (×2): 2 [IU] via SUBCUTANEOUS

## 2014-10-31 MED ORDER — DEXAMETHASONE SODIUM PHOSPHATE 10 MG/ML IJ SOLN
10.0000 mg | Freq: Once | INTRAMUSCULAR | Status: AC
  Administered 2014-10-31: 10 mg via INTRAVENOUS

## 2014-10-31 SURGICAL SUPPLY — 65 items
BAG DECANTER FOR FLEXI CONT (MISCELLANEOUS) ×3 IMPLANT
BAG SPEC THK2 15X12 ZIP CLS (MISCELLANEOUS) ×2
BAG ZIPLOCK 12X15 (MISCELLANEOUS) ×5 IMPLANT
BIT DRILL 2.8X128 (BIT) ×1 IMPLANT
BIT DRILL 2.8X128MM (BIT) ×1
BLADE EXTENDED COATED 6.5IN (ELECTRODE) ×3 IMPLANT
BLADE SAG 18X100X1.27 (BLADE) ×3 IMPLANT
BLADE SAW SAG 73X25 THK (BLADE) ×2
BLADE SAW SGTL 73X25 THK (BLADE) IMPLANT
CAPT HIP TOTAL 2 ×2 IMPLANT
CLOSURE WOUND 1/2 X4 (GAUZE/BANDAGES/DRESSINGS) ×1
COVER PERINEAL POST (MISCELLANEOUS) ×1 IMPLANT
DECANTER SPIKE VIAL GLASS SM (MISCELLANEOUS) ×3 IMPLANT
DRAPE C-ARM 42X120 X-RAY (DRAPES) ×1 IMPLANT
DRAPE INCISE IOBAN 66X45 STRL (DRAPES) ×3 IMPLANT
DRAPE ORTHO SPLIT 77X108 STRL (DRAPES) ×6
DRAPE STERI IOBAN 125X83 (DRAPES) ×3 IMPLANT
DRAPE SURG ORHT 6 SPLT 77X108 (DRAPES) ×2 IMPLANT
DRAPE U-SHAPE 47X51 STRL (DRAPES) ×12 IMPLANT
DRSG ADAPTIC 3X8 NADH LF (GAUZE/BANDAGES/DRESSINGS) ×4 IMPLANT
DRSG MEPILEX BORDER 4X4 (GAUZE/BANDAGES/DRESSINGS) ×4 IMPLANT
DRSG MEPILEX BORDER 4X8 (GAUZE/BANDAGES/DRESSINGS) ×4 IMPLANT
DURAPREP 26ML APPLICATOR (WOUND CARE) ×6 IMPLANT
ELECT REM PT RETURN 9FT ADLT (ELECTROSURGICAL) ×6
ELECTRODE REM PT RTRN 9FT ADLT (ELECTROSURGICAL) ×2 IMPLANT
EVACUATOR 1/8 PVC DRAIN (DRAIN) ×6 IMPLANT
FACESHIELD WRAPAROUND (MASK) ×12 IMPLANT
FACESHIELD WRAPAROUND OR TEAM (MASK) ×4 IMPLANT
GAUZE SPONGE 4X4 12PLY STRL (GAUZE/BANDAGES/DRESSINGS) ×3 IMPLANT
GLOVE BIO SURGEON STRL SZ7.5 (GLOVE) ×6 IMPLANT
GLOVE BIO SURGEON STRL SZ8 (GLOVE) ×12 IMPLANT
GLOVE BIOGEL PI IND STRL 8 (GLOVE) ×5 IMPLANT
GLOVE BIOGEL PI INDICATOR 8 (GLOVE) ×10
GOWN STRL REUS W/TWL LRG LVL3 (GOWN DISPOSABLE) ×6 IMPLANT
GOWN STRL REUS W/TWL XL LVL3 (GOWN DISPOSABLE) ×6 IMPLANT
HANDPIECE INTERPULSE COAX TIP (DISPOSABLE) ×3
IMMOBILIZER KNEE 20 (SOFTGOODS) ×2 IMPLANT
KIT BASIN OR (CUSTOM PROCEDURE TRAY) ×6 IMPLANT
MANIFOLD NEPTUNE II (INSTRUMENTS) ×3 IMPLANT
NDL SAFETY ECLIPSE 18X1.5 (NEEDLE) ×1 IMPLANT
NEEDLE HYPO 18GX1.5 SHARP (NEEDLE) ×3
PACK TOTAL JOINT (CUSTOM PROCEDURE TRAY) ×6 IMPLANT
PADDING CAST COTTON 6X4 STRL (CAST SUPPLIES) ×6 IMPLANT
PASSER SUT SWANSON 36MM LOOP (INSTRUMENTS) ×2 IMPLANT
PEN SKIN MARKING BROAD (MISCELLANEOUS) ×3 IMPLANT
POSITIONER SURGICAL ARM (MISCELLANEOUS) ×3 IMPLANT
SET HNDPC FAN SPRY TIP SCT (DISPOSABLE) ×1 IMPLANT
STAPLER VISISTAT 35W (STAPLE) ×3 IMPLANT
STRIP CLOSURE SKIN 1/2X4 (GAUZE/BANDAGES/DRESSINGS) ×2 IMPLANT
SUT ETHIBOND NAB CT1 #1 30IN (SUTURE) ×3 IMPLANT
SUT MNCRL AB 4-0 PS2 18 (SUTURE) ×6 IMPLANT
SUT VIC AB 1 CT1 27 (SUTURE) ×9
SUT VIC AB 1 CT1 27XBRD ANTBC (SUTURE) ×3 IMPLANT
SUT VIC AB 2-0 CT1 27 (SUTURE) ×15
SUT VIC AB 2-0 CT1 TAPERPNT 27 (SUTURE) ×5 IMPLANT
SUT VLOC 180 0 24IN GS25 (SUTURE) ×5 IMPLANT
SWAB COLLECTION DEVICE MRSA (MISCELLANEOUS) ×1 IMPLANT
SYR 30ML LL (SYRINGE) ×3 IMPLANT
SYR 50ML LL SCALE MARK (SYRINGE) IMPLANT
TOWEL OR 17X26 10 PK STRL BLUE (TOWEL DISPOSABLE) ×9 IMPLANT
TOWEL OR NON WOVEN STRL DISP B (DISPOSABLE) ×2 IMPLANT
TRAY FOLEY W/METER SILVER 14FR (SET/KITS/TRAYS/PACK) ×3 IMPLANT
TRAY FOLEY W/METER SILVER 16FR (SET/KITS/TRAYS/PACK) ×1 IMPLANT
TUBE ANAEROBIC SPECIMEN COL (MISCELLANEOUS) ×1 IMPLANT
YANKAUER SUCT BULB TIP 10FT TU (MISCELLANEOUS) IMPLANT

## 2014-10-31 NOTE — Progress Notes (Addendum)
Pt stated she wants to use her nasal mask and tubing from home.  Pt currently on PCA pump with etco2 and 4l o2 attached.  Patient's home nasal mask was ineffective as she kept her mouth open while wearing and o2 sats dropped below 90%.  Mask changed to full face mask with etco2 underneath and 4l o2.  Multiple adjustments made to cpap in attempts to make pt comfortable.  Pt stated her home cpap pressure is 3.5cm h2o.  Cpap was previously set on automode 10cm-max20cm h2o.   Pt stated she was choking and was not getting enough air.  Settings adjusted to 12, 15, then 18cm h2o with no improvement.  Pt asked that I remove mask stating she could not breathe.  Cpap removed per pt request.  Pt remains on 4lnc/etco2, HR71, spo2 94%.  Rn notified.

## 2014-10-31 NOTE — Anesthesia Procedure Notes (Signed)
Procedure Name: Intubation Date/Time: 10/31/2014 9:35 AM Performed by: Glory Buff Pre-anesthesia Checklist: Patient identified, Emergency Drugs available, Suction available and Patient being monitored Patient Re-evaluated:Patient Re-evaluated prior to inductionOxygen Delivery Method: Circle System Utilized Preoxygenation: Pre-oxygenation with 100% oxygen Intubation Type: IV induction Ventilation: Mask ventilation without difficulty Laryngoscope Size: Miller and 3 Grade View: Grade I Tube type: Oral Tube size: 7.0 mm Number of attempts: 1 Airway Equipment and Method: Stylet and Oral airway Placement Confirmation: ETT inserted through vocal cords under direct vision,  positive ETCO2 and breath sounds checked- equal and bilateral Secured at: 20 cm Tube secured with: Tape Dental Injury: Teeth and Oropharynx as per pre-operative assessment

## 2014-10-31 NOTE — Progress Notes (Signed)
Placed patient on cpap 10 cmh20 with 10 L o2 bled in with home nasal mask. O2 saturation were dropping on and off, pt was sleeping with her mouth open. RN removed cpap and placed back on simple mask. Will pass on to night shift to evaluate pt for full face mask for sleep tonight.

## 2014-10-31 NOTE — Op Note (Signed)
Pre-operative diagnosis- Osteoarthritis Left hip  Post-operative diagnosis- Osteoarthritis  Left hip  Procedure-  LeftTotal Hip Arthroplasty- Posterior approach  Surgeon- Dione Plover. Tiffany Noxon, MD  Assistant- [name], PA-C   Anesthesia  General  EBL- 200 ml  Drain Hemovac   Complication- None  Condition-PACU - hemodynamically stable.   Brief Clinical Note-  [name] is a 81 y.o. female with end stage arthritis of her left hip with progressively worsening pain and dysfunction. She had a previous acetabular ORIF and has developed severe OA with femoral head collapse.Pain occurs with activity and rest including pain at night. She has tried analgesics, protected weight bearing and rest without benefit. Pain is too severe to attempt physical therapy. Radiographs demonstrate bone on bone arthritis with subchondral cyst formation. She presents now for left THA.  Procedure in detail-   The patient is brought into the operating room and placed on the operating table. After successful administration of General  anesthesia, the patient is placed in the  Right lateral decubitus position with the  Left side up and held in place with the hip positioner. The lower extremity is isolated from the perineum with plastic drapes and time-out is performed by the surgical team. The lower extremity is then prepped and draped in the usual sterile fashion. A short posterolateral incision is made with a ten blade through the subcutaneous tissue to the level of the fascia lata which is incised in line with the skin incision. The sciatic nerve is palpated and protected and the short external rotators and capsule are isolated from the femur. The hip is then dislocated and the center of the femoral head is marked. A trial prosthesis is placed such that the trial head corresponds to the center of the patients' native femoral head. The resection level is marked on the femoral neck and the resection is made with an  oscillating saw. The femoral head is removed and femoral retractors placed to gain access to the femoral canal.      The canal finder is passed into the femoral canal and the canal is thoroughly irrigated with sterile saline to remove the fatty contents. Axial reaming is performed to 15.5  mm, proximal reaming to 20 D  and the sleeve machined to a small. A 20 D small trial sleeve is placed into the proximal femur.      The femur is then retracted anteriorly to gain acetabular exposure. Acetabular retractors are placed and the labrum and osteophytes are removed, Acetabular reaming is performed to 47  mm and a 48  mm Pinnacle acetabular shell is placed in anatomic position with excellent purchase. Additional dome screws were not needed. The permanent 28 mm neutral + 4 Marathon liner is placed into the acetabular shell.      The trial femur is then placed into the femoral canal. The size is 20 x 15  stem with a 36 + 8  neck and a 28 + 0 head with the neck version matching  the patients' native anteversion. The hip is reduced with excellent stability with full extension and full external rotation, 70 degrees flexion with 40 degrees adduction and 90 degrees internal rotation and 90 degrees of flexion with 70 degrees of internal rotation. The operative leg is placed on top of the non-operative leg and the leg lengths are found to be equal. The trials are then removed and the permanent implant of the same size is impacted into the femoral canal. The ceramic femoral head of  the same size as the trial is placed and the hip is reduced with the same stability parameters. The operative leg is again placed on top of the non-operative leg and the leg lengths are found to be equal.      The wound is then copiously irrigated with saline solution and the capsule and short external rotators are re-attached to the femur through drill holes with Ethibond suture. The fascia lata is closed over a hemovac drain with #1 vicryl suture  and the fascia lata, gluteal muscles and subcutaneous tissues are injected with 30 ml of .25% Marcaine. The subcutaneous tissues are closed with #1 and2-0 vicryl and the subcuticular layer closed with running 4-0 Monocryl. The drain is hooked to suction, incision cleaned and dried, and steri-srips and a bulky sterile dressing applied. The limb is placed into a knee immobilizer and the patient is awakened and transported to recovery in stable condition.      Please note that a surgical assistant was a medical necessity for this procedure in order to perform it in a safe and expeditious manner. The assistant was necessary to provide retraction to the vital neurovascular structures and to retract and position the limb to allow for anatomic placement of the prosthetic components.  Dione Plover Tiffany Kaspar, MD    10/31/2014, 10:48 AM

## 2014-10-31 NOTE — Interval H&P Note (Signed)
History and Physical Interval Note:  10/31/2014 8:52 AM  [name]  has presented today for surgery, with the diagnosis of LEFT HIP OA  The various methods of treatment have been discussed with the patient and family. After consideration of risks, benefits and other options for treatment, the patient has consented to  Procedure(s): LEFT TOTAL HIP ARTHROPLASTY POSTERIOR  APPROACH (Left) as a surgical intervention .  The patient's history has been reviewed, patient examined, no change in status, stable for surgery.  I have reviewed the patient's chart and labs.  Questions were answered to the patient's satisfaction.     Gearlean Alf

## 2014-10-31 NOTE — Progress Notes (Signed)
Utilization review completed.  

## 2014-10-31 NOTE — Transfer of Care (Signed)
Immediate Anesthesia Transfer of Care Note  Patient: [name]  Procedure(s) Performed: Procedure(s): LEFT TOTAL HIP ARTHROPLASTY POSTERIOR  APPROACH (Left)  Patient Location: PACU  Anesthesia Type:General  Level of Consciousness: awake, alert , oriented and patient cooperative  Airway & Oxygen Therapy: Patient Spontanous Breathing and Patient connected to face mask oxygen  Post-op Assessment: Report given to RN, Post -op Vital signs reviewed and stable and Patient moving all extremities  Post vital signs: Reviewed and stable  Last Vitals:  Filed Vitals:   10/31/14 0740  BP: 120/79  Pulse: 75  Temp: 36.9 C  Resp: 16    Complications: No apparent anesthesia complications

## 2014-10-31 NOTE — Anesthesia Preprocedure Evaluation (Addendum)
Anesthesia Evaluation  Patient identified by MRN, date of birth, ID band Patient awake    Reviewed: Allergy & Precautions, NPO status , Patient's Chart, lab work & pertinent test results  History of Anesthesia Complications (+) history of anesthetic complications  Airway Mallampati: II  TM Distance: >3 FB Neck ROM: Full    Dental no notable dental hx.    Pulmonary shortness of breath, asthma , sleep apnea , pneumonia -, COPDformer smoker,  breath sounds clear to auscultation  Pulmonary exam normal       Cardiovascular + [name], + Peripheral Vascular Disease and +CHF Normal cardiovascular exam+ pacemaker Rhythm:Regular Rate:Normal     Neuro/Psych PSYCHIATRIC DISORDERS Depression  Neuromuscular disease CVA    GI/Hepatic Neg liver ROS, GERD-  ,  Endo/Other  diabetes, Type 2, Oral Hypoglycemic Agents  Renal/GU Renal disease  negative genitourinary   Musculoskeletal  (+) Arthritis -,   Abdominal   Peds negative pediatric ROS (+)  Hematology negative hematology ROS (+) anemia ,   Anesthesia Other Findings   Reproductive/Obstetrics negative OB ROS                          Anesthesia Physical Anesthesia Plan  ASA: III  Anesthesia Plan: General   Post-op Pain Management:    Induction: Intravenous  Airway Management Planned: Oral ETT  Additional Equipment:   Intra-op Plan:   Post-operative Plan: Extubation in OR  Informed Consent: I have reviewed the patients History and Physical, chart, labs and discussed the procedure including the risks, benefits and alternatives for the proposed anesthesia with the patient or authorized representative who has indicated his/her understanding and acceptance.   Dental advisory given  Plan Discussed with: CRNA  Anesthesia Plan Comments: (Off Eliquis since 10-29-14. ASRA recommendation is 3 days. Plan general.)       Anesthesia Quick Evaluation

## 2014-10-31 NOTE — Anesthesia Postprocedure Evaluation (Signed)
  Anesthesia Post-op Note  Patient: [name]  Procedure(s) Performed: Procedure(s) (LRB): LEFT TOTAL HIP ARTHROPLASTY POSTERIOR  APPROACH (Left)  Patient Location: PACU  Anesthesia Type: General  Level of Consciousness: awake and alert   Airway and Oxygen Therapy: Patient Spontanous Breathing  Post-op Pain: mild  Post-op Assessment: Post-op Vital signs reviewed, Patient's Cardiovascular Status Stable, Respiratory Function Stable, Patent Airway and No signs of Nausea or vomiting. Sleep apnea orders written. She expressed to me that she uses her CPAP with oxygen at home when napping and at night. She prefers CPAP when she is in bed. Her room air saturations preoperatively were not great being in the low 80s.  Last Vitals:  Filed Vitals:   10/31/14 1215  BP: 119/48  Pulse: 70  Temp:   Resp: 20    Post-op Vital Signs: stable   Complications: No apparent anesthesia complications

## 2014-11-01 ENCOUNTER — Encounter (HOSPITAL_COMMUNITY): Payer: Self-pay | Admitting: Orthopedic Surgery

## 2014-11-01 LAB — BASIC METABOLIC PANEL
Anion gap: 8 (ref 5–15)
BUN: 13 mg/dL (ref 6–20)
CALCIUM: 8.4 mg/dL — AB (ref 8.9–10.3)
CHLORIDE: 107 mmol/L (ref 101–111)
CO2: 22 mmol/L (ref 22–32)
CREATININE: 0.74 mg/dL (ref 0.44–1.00)
GFR calc non Af Amer: >60 mL/min (ref >60)
Glucose, Bld: 175 mg/dL — ABNORMAL HIGH (ref 65–99)
Potassium: 4.5 mmol/L (ref 3.5–5.1)
SODIUM: 137 mmol/L (ref 135–145)

## 2014-11-01 LAB — CBC
HCT: 27.5 % — ABNORMAL LOW (ref 36.0–46.0)
Hemoglobin: 8.6 g/dL — ABNORMAL LOW (ref 12.0–15.0)
MCH: 25.9 pg — AB (ref 26.0–34.0)
MCHC: 31.3 g/dL (ref 30.0–36.0)
MCV: 82.8 fL (ref 78.0–100.0)
PLATELETS: 290 10*3/uL (ref 150–400)
RBC: 3.32 MIL/uL — ABNORMAL LOW (ref 3.87–5.11)
RDW: 18 % — AB (ref 11.5–15.5)
WBC: 8.2 10*3/uL (ref 4.0–10.5)

## 2014-11-01 LAB — GLUCOSE, CAPILLARY
GLUCOSE-CAPILLARY: 161 mg/dL — AB (ref 65–99)
GLUCOSE-CAPILLARY: 114 mg/dL — AB (ref 65–99)
Glucose-Capillary: 221 mg/dL — ABNORMAL HIGH (ref 65–99)
Glucose-Capillary: 209 mg/dL — ABNORMAL HIGH (ref 65–99)

## 2014-11-01 MED ORDER — SODIUM CHLORIDE 0.9 % IV BOLUS (SEPSIS)
250.0000 mL | Freq: Once | INTRAVENOUS | Status: AC
  Administered 2014-11-01: 250 mL via INTRAVENOUS

## 2014-11-01 NOTE — Progress Notes (Signed)
Physical Therapy Treatment Patient Details Name: [name] MRN: [id number] DOB: 1931-10-16 Today's Date: 11/01/2014    History of Present Illness L THR - posterior; hx of CVA, CHF, PAD, COPD, pacemaker, L hip fx, and R TKR     PT Comments    Pt progressing slowly with mobility - fatigues easily and with continued low BP 95/45.  Follow Up Recommendations  SNF     Equipment Recommendations  None recommended by PT    Recommendations for Other Services OT consult     Precautions / Restrictions Precautions Precautions: Fall;Posterior Hip Precaution Booklet Issued: Yes (comment) Precaution Comments: Pt recalls NO THP Restrictions Weight Bearing Restrictions: No Other Position/Activity Restrictions: WBAT    Mobility  Bed Mobility Overal bed mobility: Needs Assistance;+2 for physical assistance Bed Mobility: Supine to Sit     Supine to sit: Mod assist;+2 for physical assistance;+2 for safety/equipment     General bed mobility comments: cues for sequence, adherence to THP and use of R LE to self assist  Transfers Overall transfer level: Needs assistance Equipment used: Rolling walker (2 wheeled) Transfers: Sit to/from Stand Sit to Stand: Mod assist;+2 physical assistance;+2 safety/equipment         General transfer comment: cues for LE management, adherence to THP and use of UEs to self assist  Ambulation/Gait Ambulation/Gait assistance: Mod assist;+2 physical assistance;+2 safety/equipment Ambulation Distance (Feet): 5 Feet Assistive device: Rolling walker (2 wheeled) Gait Pattern/deviations: Step-to pattern;Decreased step length - right;Decreased step length - left;Shuffle;Trunk flexed     General Gait Details: cues for posture, position from RW, sequence and THP; ltd by fatigue and low BP   Stairs            Wheelchair Mobility    Modified Rankin (Stroke Patients Only)       Balance                                     Cognition Arousal/Alertness: Awake/alert Behavior During Therapy: WFL for tasks assessed/performed Overall Cognitive Status: Within Functional Limits for tasks assessed       Memory: Decreased recall of precautions              Exercises      General Comments        Pertinent Vitals/Pain Pain Assessment: 0-10 Pain Score: 4  Pain Location: L hip Pain Descriptors / Indicators: Aching;Sore Pain Intervention(s): Limited activity within patient's tolerance;Monitored during session;Premedicated before session;Ice applied    Home Living                      Prior Function            PT Goals (current goals can now be found in the care plan section) Acute Rehab PT Goals Patient Stated Goal: Rehab and then home to resume previous lifestyle with decreased pain PT Goal Formulation: With patient Time For Goal Achievement: 11/08/14 Potential to Achieve Goals: Good Progress towards PT goals: Progressing toward goals    Frequency  7X/week    PT Plan Current plan remains appropriate    Co-evaluation             End of Session Equipment Utilized During Treatment: Gait belt Activity Tolerance: Patient tolerated treatment well;Patient limited by fatigue Patient left: in bed;with call bell/phone within reach;with family/visitor present     Time: 3419-3790 PT Time Calculation (min) (ACUTE ONLY): 29  min  Charges:  $Gait Training: 8-22 mins $Therapeutic Activity: 8-22 mins                    G Codes:      Jeyden Coffelt 02-Nov-2014, 2:21 PM

## 2014-11-01 NOTE — Clinical Social Work Placement (Signed)
   CLINICAL SOCIAL WORK PLACEMENT  NOTE  Date:  11/01/2014  Patient Details  Name: MADISYN MAWHINNEY MRN: [id number] Date of Birth: 27-Nov-1931  Clinical Social Work is seeking post-discharge placement for this patient at the [place] level of care (*CSW will initial, date and re-position this form in  chart as items are completed):  No   Patient/family provided with [place] Work Department's list of facilities offering this level of care within the geographic area requested by the patient (or if unable, by the patient's family).  Yes   Patient/family informed of their freedom to choose among providers that offer the needed level of care, that participate in Medicare, Medicaid or managed care program needed by the patient, have an available bed and are willing to accept the patient.  No   Patient/family informed of [facility]'s ownership interest in [facility] and [facility], as well as of the fact that they are under no obligation to receive care at these facilities.  PASRR submitted to EDS on       PASRR number received on       Existing PASRR number confirmed on 10/31/14     FL2 transmitted to all facilities in geographic area requested by pt/family on 11/01/14     FL2 transmitted to all facilities within larger geographic area on       Patient informed that his/her managed care company has contracts with or will negotiate with certain facilities, including the following:        Yes   Patient/family informed of bed offers received.  Patient chooses bed at [facility]     Physician recommends and patient chooses bed at      Patient to be transferred to [facility] on  .  Patient to be transferred to facility by       Patient family notified on   of transfer.  Name of family member notified:        PHYSICIAN       Additional Comment:    _______________________________________________ Luretha Rued, [place] 11/01/2014, 1:53 PM

## 2014-11-01 NOTE — Care Management Note (Signed)
Case Management Note  Patient Details  Name: KONNIE NOFFSINGER MRN: [id number] Date of Birth: 12-Sep-1931  Subjective/Objective:                   LEFT TOTAL HIP ARTHROPLASTY POSTERIOR APPROACH (Left) Action/Plan:  Discharge planning Expected Discharge Date:                  Expected Discharge Plan:  [place]  In-House Referral:     Discharge planning Services  CM Consult  Post Acute Care Choice:    Choice offered to:     DME Arranged:    DME Agency:     HH Arranged:    [place] Agency:     Status of Service:  Completed, signed off  Medicare Important Message Given:    Date Medicare IM Given:    Medicare IM give by:    Date Additional Medicare IM Given:    Additional Medicare Important Message give by:     If discussed at [place] of Stay Meetings, dates discussed:    Additional Comments: CM notes pt to go to SNF for rehab; CSW arranging.  No other CM needs were communicated. Dellie Catholic, RN 11/01/2014, 9:40 AM

## 2014-11-01 NOTE — Progress Notes (Signed)
Subjective: 1 Day Post-Op Procedure(s) (LRB): LEFT TOTAL HIP ARTHROPLASTY POSTERIOR  APPROACH (Left) Patient reports pain as mild and moderate.   Patient seen in rounds for Dr. Wynelle Link. Patient is having problems with pain in the hip, requiring pain medications.  She states the pain was down to the lower leg last night and now just radiating down to just above the knee. We will start therapy today. Allow to be WBAT Plan is to go SNF versus Home after hospital stay.  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  She will discuss with Dr. Wynelle Link. If she goes to rehab, she wants to look into [facility].   Objective: Vital signs in last 24 hours: Temp:  [97.2 F (36.2 C)-98.7 F (37.1 C)] 97.6 F (36.4 C) (08/25 0640) Pulse Rate:  [70-76] 75 (08/25 0640) Resp:  [13-32] 16 (08/25 0640) BP: (100-123)/(43-64) 104/52 mmHg (08/25 0640) SpO2:  [91 %-99 %] 97 % (08/25 0640)  Intake/Output from previous day:  Intake/Output Summary (Last 24 hours) at 11/01/14 0912 Last data filed at 11/01/14 0641  Gross per 24 hour  Intake 2708.75 ml  Output   1285 ml  Net 1423.75 ml    Intake/Output this shift: UOP 400 since around [place]  Labs:  Recent Labs  11/01/14 0415  HGB 8.6*    Recent Labs  11/01/14 0415  WBC 8.2  RBC 3.32*  HCT 27.5*  PLT 290    Recent Labs  11/01/14 0415  NA 137  K 4.5  CL 107  CO2 22  BUN 13  CREATININE 0.74  GLUCOSE 175*  CALCIUM 8.4*   No results for input(s): LABPT, INR in the last 72 hours.  EXAM General - Patient is Alert and Appropriate Extremity - Neurovascular intact Sensation intact distally Dorsiflexion/Plantar flexion intact Dressing - dressing C/D/I Motor Function - intact, moving foot and toes well on exam.  Hemovac pulled without difficulty.  Past Medical History  Diagnosis Date  . Amiodarone pulmonary toxicity    . Chronic renal insufficiency, stage III (moderate)     CrCl about 60 ml/min  . Diabetes   . Tobacco abuse   . Diastolic heart failure     Acute on Chronic  . Pacemaker     Permanent  . AF (atrial fibrillation)      AV ablation 9/09 [facility] per Dr Ola Spurr - AV node ablation 9/11 Dr Caryl Comes  . Hyperlipidemia   . GERD (gastroesophageal reflux disease)   . [name] (coronary artery disease)     (not sure of this 11/10  . COPD (chronic obstructive pulmonary disease)     emphysema -FeV1 73% DLCO 53% 5/09  . Invasive ductal carcinoma of breast 2011    LEFT   . OA (osteoarthritis) of knee     RIGHT  . Right sided sciatica   . Sinoatrial node dysfunction   . CHF (congestive heart failure)   . Mental disorder   . Acute blood loss anemia 04/20/2012  . Depression 06/06/2012  . Hx of cardiovascular stress test     Lexiscan Myoview (09/2013):  No definite ischemia, EF 67%; low risk  . PAD (peripheral artery disease)     w/hx right iliac/SFA stenting and left and rt leg PTA  . CVA (cerebral vascular accident)     no residual effects evident to pt   .  History of skin cancer   . Eczema   . Complication of anesthesia     "psycotic episode" after hip surg - resolved  . Sleep apnea     associated with hypersomnia uses O2 2L/M at night and during naps also uses CPAP  . Shortness of breath dyspnea     walking distances   . Pneumonia     hx of   . Asthma   . Bronchitis     hx of   . Pain     pt states has pain in fingertips per right hand pt states has been told may be carpal tunnel  . Tremors of nervous system     hands bilat     Assessment/Plan: 1 Day Post-Op Procedure(s) (LRB): LEFT TOTAL HIP ARTHROPLASTY POSTERIOR  APPROACH (Left) Principal Problem:   OA (osteoarthritis) of hip  Estimated body mass index is 28.75 kg/(m^2) as calculated from the following:   Height as of this encounter: 5' 5.5" (1.664 m).   Weight as of this encounter: 79.606 kg (175 lb 8 oz). Advance diet Up  with therapy Discharge home with home health versus Rehab  DVT Prophylaxis - Eliquis Weight Bearing As Tolerated left Leg D/C Knee Immobilizer Hemovac Pulled Begin Therapy Hip Preacutions  Tiffany Muslim, PA-C Orthopaedic Surgery 11/01/2014, 9:12 AM

## 2014-11-01 NOTE — Evaluation (Signed)
Physical Therapy Evaluation Patient Details Name: [name] MRN: [id number] DOB: 28-Jan-1932 Today's Date: 11/01/2014   History of Present Illness  L THR - posterior; hx of CVA, CHF, PAD, COPD, pacemaker, L hip fx, and R TKR   Clinical Impression  Pt s/p L THR presents with decreased L LE strength/ROM, post op pain, generalized weakness/deconditioining, and posterior THP lmiting functional mobility.  Pt would benefit from follow up rehab at SNF level to maximize IND and safety prior to return home with ltd assist.    Follow Up Recommendations SNF    Equipment Recommendations  None recommended by PT    Recommendations for Other Services OT consult     Precautions / Restrictions Precautions Precautions: Fall;Posterior Hip Precaution Booklet Issued: Yes (comment) Precaution Comments: Sign hung on wall Restrictions Weight Bearing Restrictions: No Other Position/Activity Restrictions: WBAT      Mobility  Bed Mobility Overal bed mobility: Needs Assistance;+2 for physical assistance Bed Mobility: Supine to Sit     Supine to sit: Mod assist;+2 for physical assistance;+2 for safety/equipment     General bed mobility comments: cues for sequence, adherence to THP and use of R LE to self assist  Transfers Overall transfer level: Needs assistance Equipment used: Rolling walker (2 wheeled) Transfers: Sit to/from Stand Sit to Stand: Mod assist;+2 physical assistance;+2 safety/equipment            Ambulation/Gait Ambulation/Gait assistance: Mod assist;+2 physical assistance;+2 safety/equipment Ambulation Distance (Feet): 3 Feet Assistive device: Rolling walker (2 wheeled) Gait Pattern/deviations: Step-to pattern;Decreased step length - right;Decreased step length - left;Shuffle;Trunk flexed Gait velocity: decr   General Gait Details: cues for posture, position from RW, sequence and THP; ltd by fatigue and low BP  Stairs            Wheelchair Mobility     Modified Rankin (Stroke Patients Only)       Balance Overall balance assessment: Needs assistance Sitting-balance support: No upper extremity supported Sitting balance-Leahy Scale: Fair     Standing balance support: Bilateral upper extremity supported Standing balance-Leahy Scale: Poor                               Pertinent Vitals/Pain Pain Assessment: 0-10 Pain Score: 4  Pain Location: L hip Pain Descriptors / Indicators: Aching;Sore Pain Intervention(s): Limited activity within patient's tolerance;Monitored during session;Premedicated before session;Ice applied    Home Living Family/patient expects to be discharged to:: Skilled nursing facility Living Arrangements: Spouse/significant other                    Prior Function Level of Independence: Independent with assistive device(s)               Hand Dominance   Dominant Hand: Right    Extremity/Trunk Assessment   Upper Extremity Assessment: Generalized weakness           Lower Extremity Assessment: LLE deficits/detail;RLE deficits/detail RLE Deficits / Details: Knee flex ltd ~ 80 LLE Deficits / Details: 2/5 strength at hip with AAROM at hip to 75 flex and 15 abd  Cervical / Trunk Assessment: Kyphotic  Communication   Communication: No difficulties  Cognition Arousal/Alertness: Awake/alert Behavior During Therapy: WFL for tasks assessed/performed Overall Cognitive Status: Within Functional Limits for tasks assessed       Memory: Decreased recall of precautions              General Comments  Exercises Total Joint Exercises Ankle Circles/Pumps: AROM;Both;15 reps;Supine Quad Sets: AROM;Both;10 reps;Supine Heel Slides: AAROM;15 reps;Supine;Left Hip ABduction/ADduction: AAROM;Left;15 reps;Supine      Assessment/Plan    PT Assessment Patient needs continued PT services  PT Diagnosis Difficulty walking   PT Problem List Decreased strength;Decreased range of  motion;Decreased activity tolerance;Decreased balance;Decreased mobility;Decreased knowledge of use of DME;Obesity;Pain  PT Treatment Interventions DME instruction;Gait training;Functional mobility training;Therapeutic activities;Therapeutic exercise;Balance training;Patient/family education   PT Goals (Current goals can be found in the Care Plan section) Acute Rehab PT Goals Patient Stated Goal: Rehab and then home to resume previous lifestyle with decreased pain PT Goal Formulation: With patient Time For Goal Achievement: 11/08/14 Potential to Achieve Goals: Good    Frequency 7X/week   Barriers to discharge        Co-evaluation               End of Session Equipment Utilized During Treatment: Gait belt Activity Tolerance: Patient tolerated treatment well Patient left: in chair;with call bell/phone within reach Nurse Communication: Mobility status         Time: 5638-9373 PT Time Calculation (min) (ACUTE ONLY): 47 min   Charges:   PT Evaluation $Initial PT Evaluation Tier I: 1 Procedure PT Treatments $Gait Training: 8-22 mins $Therapeutic Exercise: 8-22 mins   PT G Codes:        [name] November 27, 2014, 2:15 PM

## 2014-11-01 NOTE — Progress Notes (Signed)
OT Cancellation Note  Patient Details Name: [name] MRN: [id number] DOB: Jul 18, 1931   Cancelled Treatment:    Reason Eval/Treat Not Completed: Other (comment).  Pt limited by BP with PT. Will check another time.  [address], OTR/L [phone] 11/01/2014 11/01/2014, 10:06 AM

## 2014-11-01 NOTE — Clinical Social Work Note (Signed)
Clinical Social Work Assessment  Patient Details  Name: [name] MRN: [id number] Date of Birth: 01-16-1932  Date of referral:  11/01/14               Reason for consult:  Discharge Planning, Facility Placement                Permission sought to share information with:  Facility Art therapist granted to share information::  Yes, Verbal Permission Granted  Name::        Agency::     Relationship::     Contact Information:     Housing/Transportation Living arrangements for the past 2 months:  [place] of Information:  Patient Patient Interpreter Needed:  None Criminal Activity/Legal Involvement Pertinent to Current Situation/Hospitalization:  No - Comment as needed Significant Relationships:  Spouse Lives with:  Spouse Do you feel safe going back to the place where you live?   (ST Rehab needed.) Need for family participation in patient care:  No (Coment)  Care giving concerns:  Pt's care cannot be managed at home following hospital d/c.   Social Worker assessment / plan:  Pt hospitalized on 10/31/14 for pre planned left total hip arthroplasty. CSW met with pt to assist with d/c planning. Pt is part of the Medicare Bundle program. Pt reports she has made prior arrangements, with the assistance of RNCM from MD office, to have [place] [facility] following hospital d/c. CSW has confirmed D/C plan with SNF. CSW will continue to follow to assist with d/c planning to SNF.   Employment status:  Retired Forensic scientist:  Medicare PT Recommendations:  [place] / Referral to community resources:  [place]  Patient/Family's Response to care:  Pt feels ST rehab is needed.  Patient/Family's Understanding of and Emotional Response to Diagnosis, Current Treatment, and Prognosis: Pt is aware of her medical status. She is motivated to work with therapy and is looking forward to having ST Rehab  at [facility].  Emotional Assessment Appearance:  Appears stated age Attitude/Demeanor/Rapport:  Other (cooperative) Affect (typically observed):  Appropriate, Pleasant Orientation:  Oriented to Self, Oriented to Place, Oriented to  Time, Oriented to Situation Alcohol / Substance use:  Not Applicable Psych involvement (Current and /or in the community):  No (Comment)  Discharge Needs  Concerns to be addressed:  Discharge Planning Concerns Readmission within the last 30 days:  No Current discharge risk:  None Barriers to Discharge:  No Barriers Identified   [name], [place] 11/01/2014, 1:42 PM

## 2014-11-02 LAB — BASIC METABOLIC PANEL
Anion gap: 8 (ref 5–15)
BUN: 15 mg/dL (ref 6–20)
CALCIUM: 8.4 mg/dL — AB (ref 8.9–10.3)
CO2: 22 mmol/L (ref 22–32)
CREATININE: 0.77 mg/dL (ref 0.44–1.00)
Chloride: 107 mmol/L (ref 101–111)
GFR calc Af Amer: >60 mL/min (ref >60)
GLUCOSE: 110 mg/dL — AB (ref 65–99)
Potassium: 4.6 mmol/L (ref 3.5–5.1)
Sodium: 137 mmol/L (ref 135–145)

## 2014-11-02 LAB — CBC
HEMATOCRIT: 29.5 % — AB (ref 36.0–46.0)
Hemoglobin: 9.2 g/dL — ABNORMAL LOW (ref 12.0–15.0)
MCH: 26 pg (ref 26.0–34.0)
MCHC: 31.2 g/dL (ref 30.0–36.0)
MCV: 83.3 fL (ref 78.0–100.0)
PLATELETS: 338 10*3/uL (ref 150–400)
RBC: 3.54 MIL/uL — ABNORMAL LOW (ref 3.87–5.11)
RDW: 18.2 % — AB (ref 11.5–15.5)
WBC: 11.8 10*3/uL — AB (ref 4.0–10.5)

## 2014-11-02 LAB — GLUCOSE, CAPILLARY
GLUCOSE-CAPILLARY: 133 mg/dL — AB (ref 65–99)
GLUCOSE-CAPILLARY: 150 mg/dL — AB (ref 65–99)
GLUCOSE-CAPILLARY: 154 mg/dL — AB (ref 65–99)
Glucose-Capillary: 105 mg/dL — ABNORMAL HIGH (ref 65–99)

## 2014-11-02 MED ORDER — METOCLOPRAMIDE HCL 5 MG PO TABS: 5.0000 mg | ORAL_TABLET | Freq: Three times a day (TID) | ORAL | Status: DC | PRN

## 2014-11-02 MED ORDER — METHOCARBAMOL 500 MG PO TABS: 500.0000 mg | ORAL_TABLET | Freq: Four times a day (QID) | ORAL | Status: DC | PRN

## 2014-11-02 MED ORDER — OXYCODONE HCL 5 MG PO TABS: 5.0000 mg | ORAL_TABLET | ORAL | Status: DC | PRN

## 2014-11-02 MED ORDER — ONDANSETRON HCL 4 MG PO TABS: 4.0000 mg | ORAL_TABLET | Freq: Four times a day (QID) | ORAL | Status: DC | PRN

## 2014-11-02 MED ORDER — POLYETHYLENE GLYCOL 3350 17 G PO PACK: 17.0000 g | PACK | Freq: Every day | ORAL | Status: DC | PRN

## 2014-11-02 MED ORDER — DOCUSATE SODIUM 100 MG PO CAPS: 100.0000 mg | ORAL_CAPSULE | Freq: Two times a day (BID) | ORAL | Status: DC | PRN

## 2014-11-02 MED ORDER — FLEET ENEMA 7-19 GM/118ML RE ENEM: 1.0000 | ENEMA | Freq: Once | RECTAL | Status: DC | PRN

## 2014-11-02 MED ORDER — BISACODYL 10 MG RE SUPP: 10.0000 mg | Freq: Every day | RECTAL | Status: DC | PRN

## 2014-11-02 MED ORDER — TRAMADOL HCL 50 MG PO TABS: 50.0000 mg | ORAL_TABLET | Freq: Four times a day (QID) | ORAL | Status: DC | PRN

## 2014-11-02 NOTE — Discharge Instructions (Addendum)
Dr. Gaynelle Arabian Total Joint Specialist [facility] [address] [place], [place] [address] [phone]   POSTERIOR TOTAL HIP REPLACEMENT POSTOPERATIVE DIRECTIONS  Hip Rehabilitation, Guidelines Following Surgery  The results of a hip operation are greatly improved after range of motion and muscle strengthening exercises. Follow all safety measures which are given to protect your hip. If any of these exercises cause increased pain or swelling in your joint, decrease the amount until you are comfortable again. Then slowly increase the exercises. Call your caregiver if you have problems or questions.   HOME CARE INSTRUCTIONS  Remove items at home which could result in a fall. This includes throw rugs or furniture in walking pathways.   ICE to the affected hip every three hours for 30 minutes at a time and then as needed for pain and swelling.  Continue to use ice on the hip for pain and swelling from surgery. You may notice swelling that will progress down to the foot and ankle.  This is normal after surgery.  Elevate the leg when you are not up walking on it.    Continue to use the breathing machine which will help keep your temperature down.  It is common for your temperature to cycle up and down following surgery, especially at night when you are not up moving around and exerting yourself.  The breathing machine keeps your lungs expanded and your temperature down.  DIET You may resume your previous home diet once your are discharged from the hospital.  DRESSING / WOUND CARE / SHOWERING You may shower 3 days after surgery, but keep the wounds dry during showering.  You may use an occlusive plastic wrap (Press'n Seal for example), NO SOAKING/SUBMERGING IN THE BATHTUB.  If the bandage gets wet, change with a clean dry gauze.  If the incision gets wet, pat the wound dry with a clean towel. You may start showering once you are discharged home but do not submerge  the incision under water. Just pat the incision dry and apply a dry gauze dressing on daily. Change the surgical dressing daily and reapply a dry dressing each time.    ACTIVITY Walk with your walker as instructed. Use walker as long as suggested by your caregivers. Avoid periods of inactivity such as sitting longer than an hour when not asleep. This helps prevent blood clots.  You may resume a sexual relationship in one month or when given the OK by your doctor.  You may return to work once you are cleared by your doctor.  Do not drive a car for 6 weeks or until released by you surgeon.  Do not drive while taking narcotics.  WEIGHT BEARING Weight bearing as tolerated with assist device (walker, cane, etc) as directed, use it as long as suggested by your surgeon or therapist, typically at least 4-6 weeks.  POSTOPERATIVE CONSTIPATION PROTOCOL Constipation - defined medically as fewer than three stools per week and severe constipation as less than one stool per week.  One of the most common issues patients have following surgery is constipation.  Even if you have a regular bowel pattern at home, your normal regimen is likely to be disrupted due to multiple reasons following surgery.  Combination of anesthesia, postoperative narcotics, change in appetite and fluid intake all can affect your bowels.  In order to avoid complications following surgery, here are some recommendations in order to help you during your recovery period.  Colace (docusate) - Pick up an over-the-counter  form of Colace or another stool softener and take twice a day as long as you are requiring postoperative pain medications.  Take with a full glass of water daily.  If you experience loose stools or diarrhea, hold the colace until you stool forms back up.  If your symptoms do not get better within 1 week or if they get worse, check with your doctor.  Dulcolax (bisacodyl) - Pick up over-the-counter and take as directed by the  product packaging as needed to assist with the movement of your bowels.  Take with a full glass of water.  Use this product as needed if not relieved by Colace only.   MiraLax (polyethylene glycol) - Pick up over-the-counter to have on hand.  MiraLax is a solution that will increase the amount of water in your bowels to assist with bowel movements.  Take as directed and can mix with a glass of water, juice, soda, coffee, or tea.  Take if you go more than two days without a movement. Do not use MiraLax more than once per day. Call your doctor if you are still constipated or irregular after using this medication for 7 days in a row.  If you continue to have problems with postoperative constipation, please contact the office for further assistance and recommendations.  If you experience "the worst abdominal pain ever" or develop nausea or vomiting, please contact the office immediatly for further recommendations for treatment.  ITCHING  If you experience itching with your medications, try taking only a single pain pill, or even half a pain pill at a time.  You can also use Benadryl over the counter for itching or also to help with sleep.   TED HOSE STOCKINGS Wear the elastic stockings on both legs for three weeks following surgery during the day but you may remove then at night for sleeping.  MEDICATIONS See your medication summary on the After Visit Summary that the nursing staff will review with you prior to discharge.  You may have some home medications which will be placed on hold until you complete the course of blood thinner medication.  It is important for you to complete the blood thinner medication as prescribed by your surgeon.  Continue your approved medications as instructed at time of discharge.  PRECAUTIONS If you experience chest pain or shortness of breath - call 911 immediately for transfer to the hospital emergency department.  If you develop a fever greater that 101 F, purulent  drainage from wound, increased redness or drainage from wound, foul odor from the wound/dressing, or calf pain - CONTACT YOUR SURGEON.                                                   FOLLOW-UP APPOINTMENTS Make sure you keep all of your appointments after your operation with your surgeon and caregivers. You should call the office at the above phone number and make an appointment for approximately two weeks after the date of your surgery or on the date instructed by your surgeon outlined in the "After Visit Summary".  RANGE OF MOTION AND STRENGTHENING EXERCISES  These exercises are designed to help you keep full movement of your hip joint. Follow your caregiver's or physical therapist's instructions. Perform all exercises about fifteen times, three times per day or as directed. Exercise both hips, even if you  have had only one joint replacement. These exercises can be done on a training (exercise) mat, on the floor, on a table or on a bed. Use whatever works the best and is most comfortable for you. Use music or television while you are exercising so that the exercises are a pleasant break in your day. This will make your life better with the exercises acting as a break in routine you can look forward to.  Lying on your back, slowly slide your foot toward your buttocks, raising your knee up off the floor. Then slowly slide your foot back down until your leg is straight again.  Lying on your back spread your legs as far apart as you can without causing discomfort.  Lying on your side, raise your upper leg and foot straight up from the floor as far as is comfortable. Slowly lower the leg and repeat.  Lying on your back, tighten up the muscle in the front of your thigh (quadriceps muscles). You can do this by keeping your leg straight and trying to raise your heel off the floor. This helps strengthen the largest muscle supporting your knee.  Lying on your back, tighten up the muscles of your buttocks both  with the legs straight and with the knee bent at a comfortable angle while keeping your heel on the floor.      IF YOU ARE TRANSFERRED TO A SKILLED REHAB FACILITY If the patient is transferred to a skilled rehab facility following release from the hospital, a list of the current medications will be sent to the facility for the patient to continue.  When discharged from the skilled rehab facility, please have the facility set up the patient's [place] prior to being released. Also, the skilled facility will be responsible for providing the patient with their medications at time of release from the facility to include their pain medication, the muscle relaxants, and their blood thinner medication. If the patient is still at the rehab facility at time of the two week follow up appointment, the skilled rehab facility will also need to assist the patient in arranging follow up appointment in our office and any transportation needs.  MAKE SURE YOU:  Understand these instructions.  Get help right away if you are not doing well or get worse.    Pick up stool softner and laxative for home use following surgery while on pain medications. Do not submerge incision under water. Please use good hand washing techniques while changing dressing each day. May shower starting three days after surgery. Please use a clean towel to pat the incision dry following showers. Continue to use ice for pain and swelling after surgery. Do not use any lotions or creams on the incision until instructed by your surgeon.  Resume the Eliquis 2.5 mg twice a day.  KEEP HEELS OFF BED WHEN LAYING DOWN.  Patient has previous history of heel ulcer / skin breakdown.  CPAP Instructions: Pt was on O2 sat monitor and 4l o2 attached. Patient's home nasal mask was ineffective as she kept her mouth open while wearing and o2 sats dropped below 90%. Mask changed to full face mask with etco2 underneath and 4l o2.  Multiple adjustments made to cpap in attempts to make pt comfortable. Pt stated her home cpap pressure is 3.5cm h2o. Cpap was previously set on automode 10cm-max20cm h2o. Pt stated she was choking and was not getting enough air. Settings adjusted to 12, 15, then 18cm h2o with no improvement.  Pt remains on 4lnc/etco2, HR71, spo2 94%.  Uses CPAP with Oxygen at home at night and she places it back on during the day when she takes naps but does not use during the day when she is awake.

## 2014-11-02 NOTE — Progress Notes (Signed)
Subjective: 2 Days Post-Op Procedure(s) (LRB): LEFT TOTAL HIP ARTHROPLASTY POSTERIOR  APPROACH (Left) Patient reports pain as mild and moderate.   Patient seen in rounds for Dr. Wynelle Link. Walked only 3 and 5 feet yesterday with PT. Patient is well, but has had some minor complaints of pain in the hip and thigh, requiring pain medications. Her left heel was sore yesterday but she has kept it on a pillow and feels better today. Still on oxygen.  She uses oxygen at night with her CPAP and during the day when she takes naps but not during the day when she is awake. Will try and ween off O2 today while awake but can replace when sleeping. Plan is to go home versus SNF after hospital stay.  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  Not ready today for either home or rehab.  Probably tomorrow at the earliest.  Will tentatively setup a discharge/transfer for Saturday.  Objective: Vital signs in last 24 hours: Temp:  [96.6 F (35.9 C)-98.4 F (36.9 C)] 98.4 F (36.9 C) (08/26 0630) Pulse Rate:  [70-77] 76 (08/26 0630) Resp:  [16-18] 18 (08/26 0630) BP: (84-110)/(37-78) 110/78 mmHg (08/26 0630) SpO2:  [88 %-100 %] 90 % (08/26 0630) FiO2 (%):  [4 %] 4 % (08/25 1440)  Intake/Output from previous day:  Intake/Output Summary (Last 24 hours) at 11/02/14 0849 Last data filed at 11/02/14 0617  Gross per 24 hour  Intake 1091.5 ml  Output   1300 ml  Net -208.5 ml    Intake/Output this shift: UOP 500 since MN Output has improved.  Low output yesterday but has picked back up and better last night and this morning.  Labs:  Recent Labs  11/01/14 0415 11/02/14 0532  HGB 8.6* 9.2*    Recent Labs  11/01/14 0415 11/02/14 0532  WBC 8.2 11.8*  RBC 3.32* 3.54*  HCT 27.5* 29.5*  PLT 290 338    Recent Labs  11/01/14 0415 11/02/14 0532  NA 137 137  K 4.5 4.6  CL  107 107  CO2 22 22  BUN 13 15  CREATININE 0.74 0.77  GLUCOSE 175* 110*  CALCIUM 8.4* 8.4*   No results for input(s): LABPT, INR in the last 72 hours.  EXAM General - Patient is Alert, Appropriate and Oriented Extremity - Neurovascular intact Sensation intact distally Dorsiflexion/Plantar flexion intact No heel sore Dressing/Incision - clean, dry, no drainage, healing Motor Function - intact, moving foot and toes well on exam.   Past Medical History  Diagnosis Date  . Amiodarone pulmonary toxicity   . Chronic renal insufficiency, stage III (moderate)     CrCl about 60 ml/min  . Diabetes   . Tobacco abuse   . Diastolic heart failure     Acute on Chronic  . Pacemaker     Permanent  . AF (atrial fibrillation)      AV ablation 9/09 [place] per Dr Ola Spurr - AV node ablation 9/11 Dr Caryl Comes  . Hyperlipidemia   . GERD (gastroesophageal reflux disease)   . [name] (coronary artery disease)     (not sure of this 11/10  . COPD (chronic obstructive pulmonary disease)     emphysema -FeV1 73% DLCO 53% 5/09  . Invasive ductal carcinoma of breast 2011    LEFT   . OA (osteoarthritis) of knee  RIGHT  . Right sided sciatica   . Sinoatrial node dysfunction   . CHF (congestive heart failure)   . Mental disorder   . Acute blood loss anemia 04/20/2012  . Depression 06/06/2012  . Hx of cardiovascular stress test     Lexiscan Myoview (09/2013):  No definite ischemia, EF 67%; low risk  . PAD (peripheral artery disease)     w/hx right iliac/SFA stenting and left and rt leg PTA  . CVA (cerebral vascular accident)     no residual effects evident to pt   . History of skin cancer   . Eczema   . Complication of anesthesia     "psycotic episode" after hip surg - resolved  . Sleep apnea     associated with hypersomnia uses O2 2L/M at night and during naps also uses CPAP  . Shortness of breath dyspnea     walking distances   . Pneumonia     hx of   . Asthma   . Bronchitis     hx of   .  Pain     pt states has pain in fingertips per right hand pt states has been told may be carpal tunnel  . Tremors of nervous system     hands bilat     Assessment/Plan: 2 Days Post-Op Procedure(s) (LRB): LEFT TOTAL HIP ARTHROPLASTY POSTERIOR  APPROACH (Left) Principal Problem:   OA (osteoarthritis) of hip  Estimated body mass index is 28.75 kg/(m^2) as calculated from the following:   Height as of this encounter: 5' 5.5" (1.664 m).   Weight as of this encounter: 79.606 kg (175 lb 8 oz). Up with therapy Plan for discharge tomorrow  DVT Prophylaxis - Eliquis 2.5 mg twice a day Weight Bearing As Tolerated left Leg Ween from O2 today. Keep heels off bed. Past history of heel ulcer/sores UOP improved from yesterday. Low blood pressures yesterday but stable and improved today. Responded to the fluids yesterday.  Tentative Plan:  Likely transfer to SNF tomorrow Meds - See DC Summary Diet - Cardiac Diet F/U - next Thursday 11/08/2014 with Dr. Wynelle Link Disposition - Pending at this time. COD - Pending at time of note, probably discharged tomorrow if improving.   [name], PA-C Orthopaedic Surgery 11/02/2014, 8:49 AM

## 2014-11-02 NOTE — Care Management Important Message (Signed)
Important Message  Patient Details  Name: [name] MRN: [id number] Date of Birth: 27-Jun-1931   Medicare Important Message Given:  Yes-second notification given    Camillo Flaming 11/02/2014, 1:37 [place] Message  Patient Details  Name: [name] MRN: [id number] Date of Birth: [birth year]   Medicare Important Message Given:  Yes-second notification given    Camillo Flaming 11/02/2014, 1:37 PM

## 2014-11-02 NOTE — Evaluation (Signed)
Occupational Therapy Evaluation Patient Details Name: JIMMIE DATTILIO MRN: [id number] DOB: Mar 02, 1932 Today's Date: 11/02/2014    History of Present Illness L THR - posterior; hx of CVA, CHF, PAD, COPD, pacemaker, L hip fx, and R TKR    Clinical Impression   Pt up with +2 assist for functional mobility just outside of room and then chair pulled up. Pt's O2 drops to 85% on RA with activity but increases with rest and encouragement for PLB to 95%. Left O2 off at end of session with nursing aware. Will follow pt to progress ADL independence for next venue. She needs frequent reminders for THPs and is not able to state any on her own despite repeated education on these.     Follow Up Recommendations  SNF;Supervision/Assistance - 24 hour    Equipment Recommendations  3 in 1 bedside comode    Recommendations for Other Services       Precautions / Restrictions Precautions Precautions: Fall;Posterior Hip Precaution Booklet Issued: Yes (comment) Precaution Comments: Pt not able to recall any THPs. Reviewed 3/3 Restrictions Weight Bearing Restrictions: No Other Position/Activity Restrictions: WBAT      Mobility Bed Mobility Overal bed mobility: Needs Assistance Bed Mobility: Supine to Sit     Supine to sit: +2 for physical assistance;+2 for safety/equipment;Mod assist     General bed mobility comments: cues for sequence, THPs and increased time. use of pad to help scoot hips around to EOB.   Transfers Overall transfer level: Needs assistance Equipment used: Rolling walker (2 wheeled) Transfers: Sit to/from Stand Sit to Stand: Mod assist;+2 physical assistance;+2 safety/equipment         General transfer comment: cues for hand placement and use, cues for THPs. +2 to rise and steady.    Balance     Sitting balance-Leahy Scale: Fair       Standing balance-Leahy Scale: Poor                              ADL Overall ADL's : Needs  assistance/impaired Eating/Feeding: Set up;Sitting   Grooming: Wash/dry hands;Set up;Sitting   Upper Body Bathing: Minimal assitance;Sitting   Lower Body Bathing: +2 for safety/equipment;+2 for physical assistance;Total assistance;Sit to/from stand   Upper Body Dressing : Sitting;Minimal assistance   Lower Body Dressing: +2 for safety/equipment;+2 for physical assistance;Total assistance;Sit to/from stand   Toilet Transfer: +2 for physical assistance;+2 for safety/equipment;Ambulation;RW;Moderate assistance   Toileting- Clothing Manipulation and Hygiene: +2 for safety/equipment;+2 for physical assistance;Total assistance;Sit to/from stand         General ADL Comments: Pt not able to recall any THPs despite PT reporting that precautions were reviewed with pt prior to OT arrival. Reviewed 3/3 precautions with pt and reminded pt of handout with precautions on bedside table so she can review them. Pt groggy toward the end of session as OT helping to set up meal tray but able to arouse to voice. O2 sats on RA fluctuates between 85-95% with reminders given for PLB and relaxation. Nursing aware that O2 left off at end of session.      Vision     Perception     Praxis      Pertinent Vitals/Pain Pain Assessment: Faces Faces Pain Scale: Hurts a little bit Pain Location: L hip Pain Descriptors / Indicators: Discomfort Pain Intervention(s): Repositioned;Ice applied     Hand Dominance Right   Extremity/Trunk Assessment Upper Extremity Assessment Upper Extremity Assessment: RUE deficits/detail;LUE deficits/detail RUE  Deficits / Details: AROM R shoulder flexion limited to approximately 60 degrees. elbow grossly 3+/5. weak grip. pt wears a soft splint at night for painful digits.  LUE Deficits / Details: same as above for ROM and strength.            Communication Communication Communication: No difficulties   Cognition Arousal/Alertness: Awake/alert (later in session became a  little groggy but able to arouse to voice again) Behavior During Therapy: WFL for tasks assessed/performed Overall Cognitive Status: Within Functional Limits for tasks assessed       Memory: Decreased recall of precautions             General Comments       Exercises       Shoulder Instructions      Home Living Family/patient expects to be discharged to:: Skilled nursing facility Living Arrangements: Spouse/significant other                                      Prior Functioning/Environment Level of Independence: Independent with assistive device(s)             OT Diagnosis: Generalized weakness   OT Problem List: Decreased strength;Decreased knowledge of use of DME or AE;Decreased knowledge of precautions;Decreased activity tolerance   OT Treatment/Interventions: Self-care/ADL training;Patient/family education;Therapeutic activities;DME and/or AE instruction    OT Goals(Current goals can be found in the care plan section) Acute Rehab OT Goals Patient Stated Goal: rehab to increase independence for return home. OT Goal Formulation: With patient Time For Goal Achievement: 11/09/14 Potential to Achieve Goals: Good  OT Frequency: Min 2X/week   Barriers to D/C:            Co-evaluation PT/OT/SLP Co-Evaluation/Treatment: Yes Reason for Co-Treatment: For patient/therapist safety   OT goals addressed during session: ADL's and self-care;Proper use of Adaptive equipment and DME      End of Session Equipment Utilized During Treatment: Gait belt;Rolling walker  Activity Tolerance: Patient limited by fatigue Patient left: in chair;with call bell/phone within reach   Time: 2979-8921 OT Time Calculation (min): 36 min Charges:  OT General Charges $OT Visit: 1 Procedure OT Evaluation $Initial OT Evaluation Tier I: 1 Procedure G-Codes:    Jules Schick  [phone] 11/02/2014, 12:27 PM

## 2014-11-02 NOTE — Discharge Summary (Signed)
Physician Discharge Summary   Patient ID: [name] MRN: [id number] DOB/AGE: Nov 05, 1931 81 y.o.  Admit date: 10/31/2014 Discharge date: Tentative Date of Discharge - Saturday - 11/03/2014  Primary Diagnosis:  Osteoarthritis Left hip  Admission Diagnoses:  Past Medical History  Diagnosis Date  . Amiodarone pulmonary toxicity   . Chronic renal insufficiency, stage III (moderate)     CrCl about 60 ml/min  . Diabetes   . Tobacco abuse   . Diastolic heart failure     Acute on Chronic  . Pacemaker     Permanent  . AF (atrial fibrillation)      AV ablation 9/09 [place] per Dr Ola Spurr - AV node ablation 9/11 Dr Caryl Comes  . Hyperlipidemia   . GERD (gastroesophageal reflux disease)   . [name] (coronary artery disease)     (not sure of this 11/10  . COPD (chronic obstructive pulmonary disease)     emphysema -FeV1 73% DLCO 53% 5/09  . Invasive ductal carcinoma of breast 2011    LEFT   . OA (osteoarthritis) of knee     RIGHT  . Right sided sciatica   . Sinoatrial node dysfunction   . CHF (congestive heart failure)   . Mental disorder   . Acute blood loss anemia 04/20/2012  . Depression 06/06/2012  . Hx of cardiovascular stress test     Lexiscan Myoview (09/2013):  No definite ischemia, EF 67%; low risk  . PAD (peripheral artery disease)     w/hx right iliac/SFA stenting and left and rt leg PTA  . CVA (cerebral vascular accident)     no residual effects evident to pt   . History of skin cancer   . Eczema   . Complication of anesthesia     "psycotic episode" after hip surg - resolved  . Sleep apnea     associated with hypersomnia uses O2 2L/M at night and during naps also uses CPAP  . Shortness of breath dyspnea     walking distances   . Pneumonia     hx of   . Asthma   . Bronchitis     hx of   . Pain     pt states has pain in fingertips per right hand pt states has been told may be carpal tunnel  . Tremors of nervous system     hands bilat    Discharge Diagnoses:     Principal Problem:   OA (osteoarthritis) of hip  Estimated body mass index is 28.75 kg/(m^2) as calculated from the following:   Height as of this encounter: 5' 5.5" (1.664 m).   Weight as of this encounter: 79.606 kg (175 lb 8 oz).  Procedure(s) (LRB): LEFT TOTAL HIP ARTHROPLASTY POSTERIOR  APPROACH (Left)   Consults: None  HPI: [name] is a 81 y.o. female with end stage arthritis of her left hip with progressively worsening pain and dysfunction. She had a previous acetabular ORIF and has developed severe OA with femoral head collapse.Pain occurs with activity and rest including pain at night. She has tried analgesics, protected weight bearing and rest without benefit. Pain is too severe to attempt physical therapy. Radiographs demonstrate bone on bone arthritis with subchondral cyst formation. She presents now for left THA.  Laboratory Data: Admission on 10/31/2014  Component Date Value Ref Range Status  . Glucose-Capillary 10/31/2014 129* 65 - 99 mg/dL Final  . Comment 1 10/31/2014 Notify RN   Final  . Glucose-Capillary 10/31/2014 179* 65 - 99 mg/dL  Final  . Comment 1 10/31/2014 Notify RN   Final  . Comment 2 10/31/2014 Document in Chart   Final  . WBC 11/01/2014 8.2  4.0 - 10.5 K/uL Final  . RBC 11/01/2014 3.32* 3.87 - 5.11 MIL/uL Final  . Hemoglobin 11/01/2014 8.6* 12.0 - 15.0 g/dL Final  . HCT 11/01/2014 27.5* 36.0 - 46.0 % Final  . MCV 11/01/2014 82.8  78.0 - 100.0 fL Final  . MCH 11/01/2014 25.9* 26.0 - 34.0 pg Final  . MCHC 11/01/2014 31.3  30.0 - 36.0 g/dL Final  . RDW 11/01/2014 18.0* 11.5 - 15.5 % Final  . Platelets 11/01/2014 290  150 - 400 K/uL Final  . Sodium 11/01/2014 137  135 - 145 mmol/L Final  . Potassium 11/01/2014 4.5  3.5 - 5.1 mmol/L Final  . Chloride 11/01/2014 107  101 - 111 mmol/L Final  . CO2 11/01/2014 22  22 - 32 mmol/L Final  . Glucose, Bld 11/01/2014 175* 65 - 99 mg/dL Final  . BUN 11/01/2014 13  6 - 20 mg/dL Final  . Creatinine, Ser  11/01/2014 0.74  0.44 - 1.00 mg/dL Final  . Calcium 11/01/2014 8.4* 8.9 - 10.3 mg/dL Final  . GFR calc non Af Amer 11/01/2014 >60  >60 mL/min Final  . GFR calc Af Amer 11/01/2014 >60  >60 mL/min Final   Comment: (NOTE) The eGFR has been calculated using the CKD EPI equation. This calculation has not been validated in all clinical situations. eGFR's persistently <60 mL/min signify possible Chronic Kidney Disease.   . Anion gap 11/01/2014 8  5 - 15 Final  . Glucose-Capillary 10/31/2014 197* 65 - 99 mg/dL Final  . Glucose-Capillary 10/31/2014 222* 65 - 99 mg/dL Final  . Comment 1 10/31/2014 Notify RN   Final  . Glucose-Capillary 11/01/2014 161* 65 - 99 mg/dL Final  . Comment 1 11/01/2014 Notify RN   Final  . Glucose-Capillary 11/01/2014 221* 65 - 99 mg/dL Final  . WBC 11/02/2014 11.8* 4.0 - 10.5 K/uL Final  . RBC 11/02/2014 3.54* 3.87 - 5.11 MIL/uL Final  . Hemoglobin 11/02/2014 9.2* 12.0 - 15.0 g/dL Final  . HCT 11/02/2014 29.5* 36.0 - 46.0 % Final  . MCV 11/02/2014 83.3  78.0 - 100.0 fL Final  . MCH 11/02/2014 26.0  26.0 - 34.0 pg Final  . MCHC 11/02/2014 31.2  30.0 - 36.0 g/dL Final  . RDW 11/02/2014 18.2* 11.5 - 15.5 % Final  . Platelets 11/02/2014 338  150 - 400 K/uL Final  . Sodium 11/02/2014 137  135 - 145 mmol/L Final  . Potassium 11/02/2014 4.6  3.5 - 5.1 mmol/L Final  . Chloride 11/02/2014 107  101 - 111 mmol/L Final  . CO2 11/02/2014 22  22 - 32 mmol/L Final  . Glucose, Bld 11/02/2014 110* 65 - 99 mg/dL Final  . BUN 11/02/2014 15  6 - 20 mg/dL Final  . Creatinine, Ser 11/02/2014 0.77  0.44 - 1.00 mg/dL Final  . Calcium 11/02/2014 8.4* 8.9 - 10.3 mg/dL Final  . GFR calc non Af Amer 11/02/2014 >60  >60 mL/min Final  . GFR calc Af Amer 11/02/2014 >60  >60 mL/min Final   Comment: (NOTE) The eGFR has been calculated using the CKD EPI equation. This calculation has not been validated in all clinical situations. eGFR's persistently <60 mL/min signify possible Chronic  Kidney Disease.   . Anion gap 11/02/2014 8  5 - 15 Final  . Glucose-Capillary 11/01/2014 209* 65 - 99 mg/dL Final  .  Glucose-Capillary 11/01/2014 114* 65 - 99 mg/dL Final  . Glucose-Capillary 11/02/2014 105* 65 - 99 mg/dL Final  Hospital Outpatient Visit on 10/24/2014  Component Date Value Ref Range Status  . aPTT 10/24/2014 44* 24 - 37 seconds Final   Comment:        IF BASELINE aPTT IS ELEVATED, SUGGEST PATIENT RISK ASSESSMENT BE USED TO DETERMINE APPROPRIATE ANTICOAGULANT THERAPY.   . WBC 10/24/2014 9.0  4.0 - 10.5 K/uL Final  . RBC 10/24/2014 4.15  3.87 - 5.11 MIL/uL Final  . Hemoglobin 10/24/2014 10.7* 12.0 - 15.0 g/dL Final  . HCT 10/24/2014 34.0* 36.0 - 46.0 % Final  . MCV 10/24/2014 81.9  78.0 - 100.0 fL Final  . MCH 10/24/2014 25.8* 26.0 - 34.0 pg Final  . MCHC 10/24/2014 31.5  30.0 - 36.0 g/dL Final  . RDW 10/24/2014 18.0* 11.5 - 15.5 % Final  . Platelets 10/24/2014 385  150 - 400 K/uL Final  . Sodium 10/24/2014 137  135 - 145 mmol/L Final  . Potassium 10/24/2014 4.3  3.5 - 5.1 mmol/L Final  . Chloride 10/24/2014 102  101 - 111 mmol/L Final  . CO2 10/24/2014 25  22 - 32 mmol/L Final  . Glucose, Bld 10/24/2014 113* 65 - 99 mg/dL Final  . BUN 10/24/2014 18  6 - 20 mg/dL Final  . Creatinine, Ser 10/24/2014 0.90  0.44 - 1.00 mg/dL Final  . Calcium 10/24/2014 9.7  8.9 - 10.3 mg/dL Final  . Total Protein 10/24/2014 8.1  6.5 - 8.1 g/dL Final  . Albumin 10/24/2014 3.3* 3.5 - 5.0 g/dL Final  . AST 10/24/2014 18  15 - 41 U/L Final  . ALT 10/24/2014 14  14 - 54 U/L Final  . Alkaline Phosphatase 10/24/2014 72  38 - 126 U/L Final  . Total Bilirubin 10/24/2014 0.4  0.3 - 1.2 mg/dL Final  . GFR calc non Af Amer 10/24/2014 58* >60 mL/min Final  . GFR calc Af Amer 10/24/2014 >60  >60 mL/min Final   Comment: (NOTE) The eGFR has been calculated using the CKD EPI equation. This calculation has not been validated in all clinical situations. eGFR's persistently <60 mL/min signify  possible Chronic Kidney Disease.   . Anion gap 10/24/2014 10  5 - 15 Final  . Prothrombin Time 10/24/2014 18.0* 11.6 - 15.2 seconds Final  . INR 10/24/2014 1.48  0.00 - 1.49 Final  . ABO/RH(D) 10/24/2014 A POS   Final  . Antibody Screen 10/24/2014 NEG   Final  . Sample Expiration 10/24/2014 11/03/2014   Final  . Color, Urine 10/24/2014 YELLOW  YELLOW Final  . APPearance 10/24/2014 CLEAR  CLEAR Final  . Specific Gravity, Urine 10/24/2014 1.007  1.005 - 1.030 Final  . pH 10/24/2014 5.0  5.0 - 8.0 Final  . Glucose, UA 10/24/2014 NEGATIVE  NEGATIVE mg/dL Final  . Hgb urine dipstick 10/24/2014 NEGATIVE  NEGATIVE Final  . Bilirubin Urine 10/24/2014 NEGATIVE  NEGATIVE Final  . Ketones, ur 10/24/2014 NEGATIVE  NEGATIVE mg/dL Final  . Protein, ur 10/24/2014 NEGATIVE  NEGATIVE mg/dL Final  . Urobilinogen, UA 10/24/2014 0.2  0.0 - 1.0 mg/dL Final  . Nitrite 10/24/2014 NEGATIVE  NEGATIVE Final  . Leukocytes, UA 10/24/2014 NEGATIVE  NEGATIVE Final   MICROSCOPIC NOT DONE ON URINES WITH NEGATIVE PROTEIN, BLOOD, LEUKOCYTES, NITRITE, OR GLUCOSE <1000 mg/dL.  Marland Kitchen MRSA, PCR 10/24/2014 NEGATIVE  NEGATIVE Final  . Staphylococcus aureus 10/24/2014 NEGATIVE  NEGATIVE Final   Comment:  The Xpert SA Assay (FDA approved for NASAL specimens in patients over 59 years of age), is one component of a comprehensive surveillance program.  Test performance has been validated by [facility] for patients greater than or equal to 28 year old. It is not intended to diagnose infection nor to guide or monitor treatment.   Office Visit on 10/05/2014  Component Date Value Ref Range Status  . Pulse Generator Manufacturer 10/05/2014 S[facility]   Final  . Date Time Interrogation Session 10/05/2014 [id number]   Preliminary  . Pulse Gen Model 10/05/2014 5386 Identity ADx XL DR   Final  . Pulse Gen Serial Number 10/05/2014 [id number]   Final  . Implantable Pulse Generator Type 10/05/2014 Implantable Pulse Generator    Final  . Implantable Pulse Generator Implan* 10/05/2014 [id number]   Final  . Lead Channel Setting Sensing Sensi* 10/05/2014 4   Final  . Lead Channel Setting Pacing Amplit* 10/05/2014 2   Final  . Lead Channel Setting Pacing Pulse * 10/05/2014 0.5   Final  . Lead Channel Impedance Value 10/05/2014 478   Final  . Lead Channel Sensing Intrinsic Amp* 10/05/2014 3.5   Final  . Lead Channel Impedance Value 10/05/2014 497   Final  . Lead Channel Pacing Threshold Ampl* 10/05/2014 0.625   Final  . Lead Channel Pacing Threshold Puls* 10/05/2014 0.4   Final  . Battery Remaining Longevity 10/05/2014 0.25 years   Final  . Battery Voltage 10/05/2014 2.61   Final  . Loletha Grayer Statistic RA Percent Paced 10/05/2014 85   Final  . Loletha Grayer Statistic RV Percent Paced 10/05/2014 99*  Final  . Eval Rhythm 10/05/2014 AFL/Vp 30   Final     X-Rays:Dg Chest 2 View  10/24/2014   CLINICAL DATA:  Preop hip replacement. Diabetes, history of atrial fibrillation.  EXAM: CHEST  2 VIEW  COMPARISON:  10/17/2013  FINDINGS: Left pacer remains in place, unchanged. Heart is borderline in size. No confluent airspace opacities or effusions. No acute bony abnormality.  IMPRESSION: No active cardiopulmonary disease.   Electronically Signed   By: Rolm Baptise M.D.   On: 10/24/2014 15:59   Dg Pelvis Portable  10/31/2014   CLINICAL DATA:  Status post left hip replacement.  EXAM: PORTABLE PELVIS 1-2 VIEWS  COMPARISON:  April 25, 2012.  FINDINGS: Status post left total hip arthroplasty. Old surgical fixation of left acetabular fracture is again noted. The femoral and acetabular components of the prosthesis appear intact. Surgical drain and expected amount of postoperative soft tissue gas is noted.  IMPRESSION: Status post left total hip arthroplasty.   Electronically Signed   By: Marijo Conception, M.D.   On: 10/31/2014 12:05    EKG: Orders placed or performed in visit on 03/16/14  . EKG 12-Lead   *Note: Due to a large number of  results and/or encounters for the requested time period, some results have not been displayed. A complete set of results can be found in Results Review.     Hospital Course: Patient was admitted to [facility] and taken to the OR and underwent the above state procedure without complications.  Patient tolerated the procedure well and was later transferred to the recovery room and then to the orthopaedic floor for postoperative care.  They were given PO and IV analgesics for pain control following their surgery.  They were given 24 hours of postoperative antibiotics of  Anti-infectives    Start     Dose/Rate Route Frequency Ordered Stop  10/31/14 1530  ceFAZolin (ANCEF) IVPB 2 g/50 mL premix     2 g 100 mL/hr over 30 Minutes Intravenous Every 6 hours 10/31/14 1406 10/31/14 2132   10/31/14 0735  ceFAZolin (ANCEF) IVPB 2 g/50 mL premix     2 g 100 mL/hr over 30 Minutes Intravenous On call to O.R. 10/31/14 2542 10/31/14 0937     and started on DVT prophylaxis in the form of Eliquis.   PT and OT were ordered for total hip protocol.  The patient was allowed to be WBAT with therapy. Discharge planning was consulted to help with postop disposition and equipment needs.  Patient had a tough night on the evening of surgery.   Seen on POD 1 and was having problems with pain in the hip, requiring pain medications. She stated the pain was down to the lower leg th night of surgery and was just radiating down to just above the knee on day one.  Started therapy and allowed to be WBAT.  They started to get up OOB with therapy on day one.  Only walked only 3 and 5 feet that day with PT.  Hemovac drain was pulled without difficulty.  The knee immobilizer was removed and discontinued.  Continued to work with therapy into day two.  Dressing was changed on day two and the incision was healing well.  Patient was seen in rounds on day two and was doing better. She was still on oxygen. She used oxygen at night with  her CPAP and during the day when she took naps but not during the day when she was awake. It was felt that as long as the patient continued to improve, they would be ready to transfer the following day, Saturday 11/03/2014. At time of summary, was trying to ween off O2 while awake but could replace when sleeping. The patient will evaluated by the weekend coverage staff and will discharge if doing well.  This summary was prepared in anticipation of the the patient's transfer over the weekend.   Diet: Cardiac diet Activity:WBAT No bending hip over 90 degrees- A "L" Angle Do not cross legs Do not let foot roll inward When turning these patients a pillow should be placed between the patient's legs to prevent crossing. Patients should have the affected knee fully extended when trying to sit or stand from all surfaces to prevent excessive hip flexion. When ambulating and turning toward the affected side the affected leg should have the toes turned out prior to moving the walker and the rest of patient's body as to prevent internal rotation/ turning in of the leg. Abduction pillows are the most effective way to prevent a patient from not crossing legs or turning toes in at rest. If an abduction pillow is not ordered placing a regular pillow length wise between the patient's legs is also an effective reminder. It is imperative that these precautions be maintained so that the surgical hip does not dislocate. Follow-up: next Thursday 11/08/2014 with Dr. Wynelle Link  Disposition - [place] Discharged Condition: Pending at time of summary, Transfer on Saturday 11/03/2014 if doing well on weekend rounds.   Discharge Instructions    Call MD / Call 911    Complete by:  As directed   If you experience chest pain or shortness of breath, CALL 911 and be transported to the hospital emergency room.  If you develope a fever above 101 F, pus (white drainage) or increased drainage or redness at the wound, or calf pain, call  your surgeon's office.     Change dressing    Complete by:  As directed   You may change your dressing dressing daily with sterile 4 x 4 inch gauze dressing and paper tape.  Do not submerge the incision under water.     Constipation Prevention    Complete by:  As directed   Drink plenty of fluids.  Prune juice may be helpful.  You may use a stool softener, such as Colace (over the counter) 100 mg twice a day.  Use MiraLax (over the counter) for constipation as needed.     Diet - low sodium heart healthy    Complete by:  As directed      Discharge instructions    Complete by:  As directed   Pick up stool softner and laxative for home use following surgery while on pain medications. Do not submerge incision under water. Please use good hand washing techniques while changing dressing each day. May shower starting three days after surgery on Saturday 11/03/2014 Please use a clean towel to pat the incision dry following showers. Continue to use ice for pain and swelling after surgery. Do not use any lotions or creams on the incision until instructed by your surgeon. Hip precautions.  Total Hip Protocol.  Resume the Eliquis 2.5 mg twice a day.  KEEP HEELS OFF BED WHEN LAYING DOWN.  Patient has previous history of heel ulcer / skin breakdown.  CPAP Instructions: Pt was on O2 sat monitor and 4l o2 attached. Patient's home nasal mask was ineffective as she kept her mouth open while wearing and o2 sats dropped below 90%. Mask changed to full face mask with etco2 underneath and 4l o2. Multiple adjustments made to cpap in attempts to make pt comfortable. Pt stated her home cpap pressure is 3.5cm h2o. Cpap was previously set on automode 10cm-max20cm h2o. Pt stated she was choking and was not getting enough air. Settings adjusted to 12, 15, then 18cm h2o with no improvement. Pt remains on 4lnc/etco2, HR71, spo2 94%.  Uses CPAP with Oxygen at home at night and she places it back on during the  day when she takes naps but does not use during the day when she is awake.  Postoperative Constipation Protocol  Constipation - defined medically as fewer than three stools per week and severe constipation as less than one stool per week.  One of the most common issues patients have following surgery is constipation.  Even if you have a regular bowel pattern at home, your normal regimen is likely to be disrupted due to multiple reasons following surgery.  Combination of anesthesia, postoperative narcotics, change in appetite and fluid intake all can affect your bowels.  In order to avoid complications following surgery, here are some recommendations in order to help you during your recovery period.  Colace (docusate) - Pick up an over-the-counter form of Colace or another stool softener and take twice a day as long as you are requiring postoperative pain medications.  Take with a full glass of water daily.  If you experience loose stools or diarrhea, hold the colace until you stool forms back up.  If your symptoms do not get better within 1 week or if they get worse, check with your doctor.  Dulcolax (bisacodyl) - Pick up over-the-counter and take as directed by the product packaging as needed to assist with the movement of your bowels.  Take with a full glass of water.  Use this product as needed if  not relieved by Colace only.   MiraLax (polyethylene glycol) - Pick up over-the-counter to have on hand.  MiraLax is a solution that will increase the amount of water in your bowels to assist with bowel movements.  Take as directed and can mix with a glass of water, juice, soda, coffee, or tea.  Take if you go more than two days without a movement. Do not use MiraLax more than once per day. Call your doctor if you are still constipated or irregular after using this medication for 7 days in a row.  If you continue to have problems with postoperative constipation, please contact the office for further  assistance and recommendations.  If you experience "the worst abdominal pain ever" or develop nausea or vomiting, please contact the office immediatly for further recommendations for treatment.  When discharged from the skilled rehab facility, please have the facility set up the patient's [place] prior to being released.  Please make sure this gets set up prior to release in order to avoid any lapse of therapy following the rehab stay.  Also provide the patient with their medications at time of release from the facility to include their pain medication, the muscle relaxants, and their blood thinner medication.  If the patient is still at the rehab facility at time of follow up appointment, please also assist the patient in arranging follow up appointment in our office and any transportation needs. ICE to the affected knee or hip every three hours for 30 minutes at a time and then as needed for pain and swelling.     Do not sit on low chairs, stoools or toilet seats, as it may be difficult to get up from low surfaces    Complete by:  As directed      Driving restrictions    Complete by:  As directed   No driving until released by the physician.     Follow the hip precautions as taught in Physical Therapy    Complete by:  As directed      Increase activity slowly as tolerated    Complete by:  As directed      Lifting restrictions    Complete by:  As directed   No lifting until released by the physician.     Patient may shower    Complete by:  As directed   You may shower without a dressing once there is no drainage.  Do not wash over the wound.  If drainage remains, do not shower until drainage stops.     TED hose    Complete by:  As directed   Use stockings (TED hose) for 3 weeks on both leg(s).  You may remove them at night for sleeping.  KEEP HEELS OFF BED WHEN LAYING DOWN.     Weight bearing as tolerated    Complete by:  As directed   Laterality:  left  Extremity:   Lower            Medication List    STOP taking these medications        hydrocortisone cream 1 %      TAKE these medications        acetaminophen 650 MG CR tablet  Commonly known as:  TYLENOL  Take 1,300 mg by mouth every 8 (eight) hours as needed for pain.     apixaban 5 MG Tabs tablet  Commonly known as:  ELIQUIS  Take 1 tablet (5 mg total) by  mouth 2 (two) times daily.     atenolol 25 MG tablet  Commonly known as:  TENORMIN  Take 1 tablet (25 mg total) by mouth every morning.     atorvastatin 20 MG tablet  Commonly known as:  LIPITOR  TAKE 1 TABLET EVERY DAY     BIOTIN 5000 5 MG Caps  Generic drug:  Biotin  Take 1 capsule by mouth at bedtime.     bisacodyl 10 MG suppository  Commonly known as:  DULCOLAX  Place 1 suppository (10 mg total) rectally daily as needed for moderate constipation.     cilostazol 100 MG tablet  Commonly known as:  PLETAL  Take 100 mg by mouth 2 (two) times daily.     docusate sodium 100 MG capsule  Commonly known as:  COLACE  Take 1 capsule (100 mg total) by mouth 2 (two) times daily as needed for mild constipation.     Fluticasone-Salmeterol 250-50 MCG/DOSE Aepb  Commonly known as:  ADVAIR  Inhale 1 puff into the lungs 2 (two) times daily.     letrozole 2.5 MG tablet  Commonly known as:  FEMARA  TAKE 1 TABLET (2.5 MG TOTAL) BY MOUTH DAILY.     levalbuterol 45 MCG/ACT inhaler  Commonly known as:  XOPENEX HFA  Inhale 2 puffs into the lungs every 6 (six) hours as needed. For shortness of breath     metFORMIN 500 MG tablet  Commonly known as:  GLUCOPHAGE  TAKE 1 TABLET TWICE DAILY WITH A MEAL     methocarbamol 500 MG tablet  Commonly known as:  ROBAXIN  Take 1 tablet (500 mg total) by mouth every 6 (six) hours as needed for muscle spasms.     metoCLOPramide 5 MG tablet  Commonly known as:  REGLAN  Take 1 tablet (5 mg total) by mouth every 8 (eight) hours as needed for nausea (if ondansetron (ZOFRAN) ineffective.).      mometasone 50 MCG/ACT nasal spray  Commonly known as:  NASONEX  Place 2 sprays into the nose daily as needed (allergies).     multivitamin with minerals tablet  Take 1 tablet by mouth 2 (two) times daily.     NON FORMULARY  Take 1 capsule by mouth 2 (two) times daily. procera     ondansetron 4 MG tablet  Commonly known as:  ZOFRAN  Take 1 tablet (4 mg total) by mouth every 6 (six) hours as needed for nausea.     oxyCODONE 5 MG immediate release tablet  Commonly known as:  Oxy IR/ROXICODONE  Take 1-2 tablets (5-10 mg total) by mouth every 3 (three) hours as needed for moderate pain, severe pain or breakthrough pain.     polyethylene glycol packet  Commonly known as:  MIRALAX / GLYCOLAX  Take 17 g by mouth daily as needed for mild constipation.     potassium chloride SA 20 MEQ tablet  Commonly known as:  K-DUR,KLOR-CON  Take 1 tablet (20 mEq total) by mouth daily.     senna-docusate 8.6-50 MG per tablet  Commonly known as:  Senokot-S  Take 1 tablet by mouth 2 (two) times daily. Stop for diarrhea or loose stools     sodium phosphate 7-19 GM/118ML Enem  Place 133 mLs (1 enema total) rectally once as needed for severe constipation.     SPIRIVA HANDIHALER 18 MCG inhalation capsule  Generic drug:  tiotropium  INHALE THE CONTENTS OF 1 CAPSULE EVERY DAY     torsemide 10 MG tablet  Commonly known as:  DEMADEX  Take 1 tablet (10 mg total) by mouth daily.     traMADol 50 MG tablet  Commonly known as:  ULTRAM  Take 1-2 tablets (50-100 mg total) by mouth every 6 (six) hours as needed (mild pain).           Follow-up Information    Follow up with Gearlean Alf, MD On 11/08/2014.   Specialty:  Orthopedic Surgery   Why:  Call office ASAP at [phone] to setup appointment on next Thursday 11/08/2014 with Dr. Wynelle Link.   Contact information:   [address] [place] [address] [phone]       Signed: [name], PA-C Orthopaedic Surgery 11/02/2014, 9:20  AM

## 2014-11-02 NOTE — Progress Notes (Signed)
Physical Therapy Treatment Patient Details Name: [name] MRN: [id number] DOB: [birth year] Today's Date: 11/02/2014    History of Present Illness L THR - posterior; hx of CVA, CHF, PAD, COPD, pacemaker, L hip fx, and R TKR     PT Comments    Progressing slowly with mobility with min carry over between sessions.  Follow Up Recommendations  SNF     Equipment Recommendations  None recommended by PT    Recommendations for Other Services OT consult     Precautions / Restrictions Precautions Precautions: Fall;Posterior Hip Precaution Booklet Issued: Yes (comment) Precaution Comments: Pt not able to recall any THPs. Reviewed 3/3 Restrictions Weight Bearing Restrictions: No Other Position/Activity Restrictions: WBAT    Mobility  Bed Mobility Overal bed mobility: Needs Assistance Bed Mobility: Supine to Sit     Supine to sit: +2 for physical assistance;+2 for safety/equipment;Mod assist     General bed mobility comments: cues for sequence, THPs and increased time. use of pad to help scoot hips around to EOB.   Transfers Overall transfer level: Needs assistance Equipment used: Rolling walker (2 wheeled) Transfers: Sit to/from Stand Sit to Stand: Mod assist;+2 physical assistance;+2 safety/equipment         General transfer comment: cues for hand placement and use, cues for THPs. +2 to rise and steady.  Ambulation/Gait Ambulation/Gait assistance: Mod assist;+2 safety/equipment Ambulation Distance (Feet): 14 Feet Assistive device: Rolling walker (2 wheeled) Gait Pattern/deviations: Step-to pattern;Decreased step length - right;Decreased step length - left;Shuffle;Trunk flexed Gait velocity: decr   General Gait Details: cues for posture, position from RW, sequence and THP; ltd by fatigue   Stairs            Wheelchair Mobility    Modified Rankin (Stroke Patients Only)       Balance     Sitting balance-Leahy Scale: Fair       Standing  balance-Leahy Scale: Poor                      Cognition Arousal/Alertness: Awake/alert (later in session became a little groggy but able to arouse to voice again) Behavior During Therapy: [facility] for tasks assessed/performed Overall Cognitive Status: Within Functional Limits for tasks assessed       Memory: Decreased recall of precautions              Exercises Total Joint Exercises Ankle Circles/Pumps: AROM;Both;15 reps;Supine Quad Sets: AROM;Both;10 reps;Supine Heel Slides: AAROM;Supine;Left;20 reps Hip ABduction/ADduction: AAROM;Left;Supine;20 reps    General Comments        Pertinent Vitals/Pain Pain Assessment: Faces Faces Pain Scale: Hurts a little bit Pain Location: L hip Pain Descriptors / Indicators: Discomfort Pain Intervention(s): Limited activity within patient's tolerance;Monitored during session;Premedicated before session;Ice applied    Home Living Family/patient expects to be discharged to:: Skilled nursing facility Living Arrangements: Spouse/significant other                  Prior Function Level of Independence: Independent with assistive device(s)          PT Goals (current goals can now be found in the care plan section) Acute Rehab PT Goals Patient Stated Goal: rehab to increase independence for return home. PT Goal Formulation: With patient Time For Goal Achievement: 11/08/14 Potential to Achieve Goals: Good Progress towards PT goals: Progressing toward goals    Frequency  7X/week    PT Plan Current plan remains appropriate    Co-evaluation PT/OT/SLP Co-Evaluation/Treatment: Yes Reason for Co-Treatment: For  patient/therapist safety PT goals addressed during session: Mobility/safety with mobility OT goals addressed during session: ADL's and self-care;Proper use of Adaptive equipment and DME     End of Session Equipment Utilized During Treatment: Gait belt Activity Tolerance: Patient tolerated treatment well;Patient  limited by fatigue Patient left: in chair;with call bell/phone within reach     Time: 0847-0928 PT Time Calculation (min) (ACUTE ONLY): 41 min  Charges:  $Gait Training: 8-22 mins $Therapeutic Exercise: 8-22 mins                    G Codes:      Korynne Dols 11/12/2014, 12:59 PM

## 2014-11-02 NOTE — Progress Notes (Signed)
Physical Therapy Treatment Patient Details Name: [name] MRN: [id number] DOB: [birth year] Today's Date: 11/02/2014    History of Present Illness L THR - posterior; hx of CVA, CHF, PAD, COPD, pacemaker, L hip fx, and R TKR     PT Comments    Pt progressing steadily with mobility.  Follow Up Recommendations  SNF     Equipment Recommendations  None recommended by PT    Recommendations for Other Services OT consult     Precautions / Restrictions Precautions Precautions: Fall;Posterior Hip Precaution Booklet Issued: Yes (comment) Precaution Comments: Pt recalls 1/3 THP with min cues Restrictions Weight Bearing Restrictions: No Other Position/Activity Restrictions: WBAT    Mobility  Bed Mobility Overal bed mobility: Needs Assistance Bed Mobility: Supine to Sit     Supine to sit: +2 for physical assistance;Min assist     General bed mobility comments: cues for sequence, THPs and increased time. use of pad to help scoot hips around to EOB.   Transfers Overall transfer level: Needs assistance Equipment used: Rolling walker (2 wheeled) Transfers: Sit to/from Stand Sit to Stand: Min assist;Mod assist;+2 safety/equipment Stand pivot transfers: Min assist;Mod assist       General transfer comment: cues for hand placement and use, cues for THPs.   Ambulation/Gait Ambulation/Gait assistance: Min assist;Mod assist Ambulation Distance (Feet): 26 Feet Assistive device: Rolling walker (2 wheeled) Gait Pattern/deviations: Step-to pattern;Decreased step length - left;Decreased step length - right;Shuffle;Trunk flexed Gait velocity: decr   General Gait Details: cues for posture, position from RW, sequence and THP; ltd by fatigue   Stairs            Wheelchair Mobility    Modified Rankin (Stroke Patients Only)       Balance                                    Cognition Arousal/Alertness: Awake/alert Behavior During Therapy: WFL for  tasks assessed/performed Overall Cognitive Status: Within Functional Limits for tasks assessed       Memory: Decreased recall of precautions              Exercises      General Comments        Pertinent Vitals/Pain Pain Assessment: Faces Faces Pain Scale: Hurts a little bit Pain Location: L hip Pain Descriptors / Indicators: Aching;Sore Pain Intervention(s): Limited activity within patient's tolerance;Monitored during session;Premedicated before session;Ice applied    Home Living                      Prior Function            PT Goals (current goals can now be found in the care plan section) Acute Rehab PT Goals Patient Stated Goal: rehab to increase independence for return home. PT Goal Formulation: With patient Time For Goal Achievement: 11/08/14 Potential to Achieve Goals: Good Progress towards PT goals: Progressing toward goals    Frequency  7X/week    PT Plan Current plan remains appropriate    Co-evaluation             End of Session Equipment Utilized During Treatment: Gait belt Activity Tolerance: Patient tolerated treatment well;Patient limited by fatigue Patient left: in chair;with call bell/phone within reach     Time: 7829-5621 PT Time Calculation (min) (ACUTE ONLY): 30 min  Charges:  $Gait Training: 8-22 mins $Therapeutic Activity: 8-22 mins  G Codes:      Maliha Outten Nov 04, 2014, 5:01 PM

## 2014-11-02 NOTE — Progress Notes (Signed)
[facility] assisting with d/c planning. [facility] is able to accept pt on SAT. Tentative D/C summary has been sent to SNF today. Weekend CSW will assist with d/c planning to SNF.  Werner Lean LCSW [phone]

## 2014-11-02 NOTE — Progress Notes (Signed)
Pt placed on Auto CPAP 4-20 CMH20 via home nasal mask and tubing with 2 LPM O2 bleed in.  Pt tolerating well at this time, RT to monitor and assess as needed.

## 2014-11-03 DIAGNOSIS — Z471 Aftercare following joint replacement surgery: Secondary | ICD-10-CM | POA: Diagnosis not present

## 2014-11-03 DIAGNOSIS — R2681 Unsteadiness on feet: Secondary | ICD-10-CM | POA: Diagnosis not present

## 2014-11-03 DIAGNOSIS — E118 Type 2 diabetes mellitus with unspecified complications: Secondary | ICD-10-CM | POA: Diagnosis not present

## 2014-11-03 DIAGNOSIS — F329 Major depressive disorder, single episode, unspecified: Secondary | ICD-10-CM | POA: Diagnosis not present

## 2014-11-03 DIAGNOSIS — I4891 Unspecified atrial fibrillation: Secondary | ICD-10-CM | POA: Diagnosis not present

## 2014-11-03 DIAGNOSIS — N183 Chronic kidney disease, stage 3 (moderate): Secondary | ICD-10-CM | POA: Diagnosis not present

## 2014-11-03 DIAGNOSIS — I259 Chronic ischemic heart disease, unspecified: Secondary | ICD-10-CM | POA: Diagnosis not present

## 2014-11-03 DIAGNOSIS — Z96642 Presence of left artificial hip joint: Secondary | ICD-10-CM | POA: Diagnosis not present

## 2014-11-03 DIAGNOSIS — K5900 Constipation, unspecified: Secondary | ICD-10-CM | POA: Diagnosis not present

## 2014-11-03 DIAGNOSIS — D62 Acute posthemorrhagic anemia: Secondary | ICD-10-CM | POA: Diagnosis not present

## 2014-11-03 DIAGNOSIS — M6281 Muscle weakness (generalized): Secondary | ICD-10-CM | POA: Diagnosis not present

## 2014-11-03 DIAGNOSIS — E119 Type 2 diabetes mellitus without complications: Secondary | ICD-10-CM | POA: Diagnosis not present

## 2014-11-03 DIAGNOSIS — J449 Chronic obstructive pulmonary disease, unspecified: Secondary | ICD-10-CM | POA: Diagnosis not present

## 2014-11-03 DIAGNOSIS — E785 Hyperlipidemia, unspecified: Secondary | ICD-10-CM | POA: Diagnosis not present

## 2014-11-03 DIAGNOSIS — C50912 Malignant neoplasm of unspecified site of left female breast: Secondary | ICD-10-CM | POA: Diagnosis not present

## 2014-11-03 DIAGNOSIS — G4733 Obstructive sleep apnea (adult) (pediatric): Secondary | ICD-10-CM | POA: Diagnosis not present

## 2014-11-03 DIAGNOSIS — E559 Vitamin D deficiency, unspecified: Secondary | ICD-10-CM | POA: Diagnosis not present

## 2014-11-03 DIAGNOSIS — R531 Weakness: Secondary | ICD-10-CM | POA: Diagnosis not present

## 2014-11-03 DIAGNOSIS — R278 Other lack of coordination: Secondary | ICD-10-CM | POA: Diagnosis not present

## 2014-11-03 DIAGNOSIS — I509 Heart failure, unspecified: Secondary | ICD-10-CM | POA: Diagnosis not present

## 2014-11-03 DIAGNOSIS — E876 Hypokalemia: Secondary | ICD-10-CM | POA: Diagnosis not present

## 2014-11-03 DIAGNOSIS — M25552 Pain in left hip: Secondary | ICD-10-CM | POA: Diagnosis not present

## 2014-11-03 DIAGNOSIS — I5032 Chronic diastolic (congestive) heart failure: Secondary | ICD-10-CM | POA: Diagnosis not present

## 2014-11-03 DIAGNOSIS — M1612 Unilateral primary osteoarthritis, left hip: Secondary | ICD-10-CM | POA: Diagnosis not present

## 2014-11-03 DIAGNOSIS — K210 Gastro-esophageal reflux disease with esophagitis: Secondary | ICD-10-CM | POA: Diagnosis not present

## 2014-11-03 LAB — CBC
HEMATOCRIT: 28.8 % — AB (ref 36.0–46.0)
HEMOGLOBIN: 9 g/dL — AB (ref 12.0–15.0)
MCH: 25.8 pg — AB (ref 26.0–34.0)
MCHC: 31.3 g/dL (ref 30.0–36.0)
MCV: 82.5 fL (ref 78.0–100.0)
Platelets: 321 10*3/uL (ref 150–400)
RBC: 3.49 MIL/uL — AB (ref 3.87–5.11)
RDW: 18.6 % — ABNORMAL HIGH (ref 11.5–15.5)
WBC: 9.5 10*3/uL (ref 4.0–10.5)

## 2014-11-03 LAB — GLUCOSE, CAPILLARY
GLUCOSE-CAPILLARY: 154 mg/dL — AB (ref 65–99)
Glucose-Capillary: 127 mg/dL — ABNORMAL HIGH (ref 65–99)

## 2014-11-03 NOTE — Clinical Social Work Placement (Signed)
   CLINICAL SOCIAL WORK PLACEMENT  NOTE  Date:  11/03/2014  Patient Details  Name: [name] MRN: [id number] Date of Birth: February 13, 1932  Clinical Social Work is seeking post-discharge placement for this patient at the [place] level of care (*CSW will initial, date and re-position this form in  chart as items are completed):  No   Patient/family provided with [place] Work Department's list of facilities offering this level of care within the geographic area requested by the patient (or if unable, by the patient's family).  Yes   Patient/family informed of their freedom to choose among providers that offer the needed level of care, that participate in Medicare, Medicaid or managed care program needed by the patient, have an available bed and are willing to accept the patient.  No   Patient/family informed of [facility]'s ownership interest in [facility] and [facility], as well as of the fact that they are under no obligation to receive care at these facilities.  PASRR submitted to EDS on       PASRR number received on       Existing PASRR number confirmed on 10/31/14     FL2 transmitted to all facilities in geographic area requested by pt/family on 11/01/14     FL2 transmitted to all facilities within larger geographic area on       Patient informed that his/her managed care company has contracts with or will negotiate with certain facilities, including the following:        Yes   Patient/family informed of bed offers received.  Patient chooses bed at [facility]     Physician recommends and patient chooses bed at      Patient to be transferred to [facility] on  .November 03, 2014  Patient to be transferred to facility by    ambulance   Patient family notified on  November 03, 2014 of transfer.  Name of family member notified:   Samuel/son     PHYSICIAN       Additional Comment:     _______________________________________________ Carlean Jews, LCSW 11/03/2014, 4:18 PM

## 2014-11-03 NOTE — Progress Notes (Signed)
Physical Therapy Treatment Patient Details Name: [name] MRN: [id number] DOB: August 07, 1931 Today's Date: 11/03/2014    History of Present Illness L THR - posterior; hx of CVA, CHF, PAD, COPD, pacemaker, L hip fx, and R TKR     PT Comments    Pt cooperative but ltd this am by fatigue  Follow Up Recommendations  SNF     Equipment Recommendations  None recommended by PT    Recommendations for Other Services OT consult     Precautions / Restrictions Precautions Precautions: Fall;Posterior Hip Precaution Booklet Issued: Yes (comment) Precaution Comments: Pt recalls 2/3 THP with min cues but continues to attempt to break rules frequently Restrictions Weight Bearing Restrictions: No Other Position/Activity Restrictions: WBAT    Mobility  Bed Mobility               General bed mobility comments: Pt OOB with nursing and requests back to chair  Transfers Overall transfer level: Needs assistance Equipment used: Rolling walker (2 wheeled) Transfers: Sit to/from Stand Sit to Stand: Min assist;Mod assist;+2 safety/equipment Stand pivot transfers: Min assist       General transfer comment: cues for hand placement and use, cues for THPs.   Ambulation/Gait Ambulation/Gait assistance: Min assist Ambulation Distance (Feet): 13 Feet Assistive device: Rolling walker (2 wheeled) Gait Pattern/deviations: Step-to pattern;Decreased step length - right;Decreased step length - left;Shuffle;Trunk flexed Gait velocity: decr   General Gait Details: cues for posture, position from RW, sequence and THP; ltd by fatigue   Stairs            Wheelchair Mobility    Modified Rankin (Stroke Patients Only)       Balance                                    Cognition Arousal/Alertness: Awake/alert Behavior During Therapy: WFL for tasks assessed/performed Overall Cognitive Status: Within Functional Limits for tasks assessed       Memory: Decreased  recall of precautions              Exercises      General Comments        Pertinent Vitals/Pain Pain Assessment: 0-10 Pain Score: 2  Pain Location: L hip Pain Descriptors / Indicators: Aching;Sore Pain Intervention(s): Limited activity within patient's tolerance;Monitored during session;Premedicated before session    Home Living                      Prior Function            PT Goals (current goals can now be found in the care plan section) Acute Rehab PT Goals Patient Stated Goal: rehab to increase independence for return home. PT Goal Formulation: With patient Time For Goal Achievement: 11/08/14 Potential to Achieve Goals: Good Progress towards PT goals: Progressing toward goals    Frequency  7X/week    PT Plan Current plan remains appropriate    Co-evaluation             End of Session Equipment Utilized During Treatment: Gait belt Activity Tolerance: Patient limited by fatigue Patient left: in chair;with call bell/phone within reach     Time: 7494-4967 PT Time Calculation (min) (ACUTE ONLY): 47 min  Charges:  $Gait Training: 8-22 mins $Therapeutic Activity: 8-22 mins                    G Codes:  [name] 11/03/2014, 11:41 AM

## 2014-11-03 NOTE — Progress Notes (Signed)
Tiffany Leaf, RN supervisor at [facility] given report via phone regarding pt status, recent vitals and reason for transport to facility. [name] aware of recent CBG 154 and need to recheck upon arrival. Pt had no  lunch and just finished breakfast less than 2 hrs ago. Pt denied pain prior to leaving. [name] aware of all medications given prior to dc.

## 2014-11-03 NOTE — Progress Notes (Signed)
Assessment unchanged. Pt verbalized understanding of need for transport via ambulance to [facility]. Family aware of pt's discharge today. [facility] here to transport. VSS obtained for paramedic. Pt denied pain. Packet prepared by SW given to EMS paramedic. Discharged via stretcher accompanied by EMS staff x 2 to meet ambulance to transport to [facility].

## 2014-11-03 NOTE — Progress Notes (Signed)
   Subjective: 3 Days Post-Op Procedure(s) (LRB): LEFT TOTAL HIP ARTHROPLASTY POSTERIOR  APPROACH (Left)  Pt with mild pain in the left hip States she is not walking as well as she would like but improving slowly Denies any new symptoms or issues Plan for d/c to [facility] place today Patient reports pain as mild.  Objective:   VITALS:   Filed Vitals:   11/03/14 0548  BP: 114/60  Pulse: 77  Temp: 98.6 F (37 C)  Resp: 16    Left hip incision healing well nv intact distally No rashes or edema  LABS  Recent Labs  11/01/14 0415 11/02/14 0532 11/03/14 0432  HGB 8.6* 9.2* 9.0*  HCT 27.5* 29.5* 28.8*  WBC 8.2 11.8* 9.5  PLT 290 338 321     Recent Labs  11/01/14 0415 11/02/14 0532  NA 137 137  K 4.5 4.6  BUN 13 15  CREATININE 0.74 0.77  GLUCOSE 175* 110*     Assessment/Plan: 3 Days Post-Op Procedure(s) (LRB): LEFT TOTAL HIP ARTHROPLASTY POSTERIOR  APPROACH (Left) Plan to d/c to [facility] place today after therapy Pain management as needed F/u in 2 weeks in the office    Brad Emmanuela Ghazi, MPAS, PA-C  11/03/2014, 7:40 AM

## 2014-11-07 ENCOUNTER — Non-Acute Institutional Stay (SKILLED_NURSING_FACILITY): Payer: Medicare Other | Admitting: Adult Health

## 2014-11-07 ENCOUNTER — Encounter: Payer: Self-pay | Admitting: Adult Health

## 2014-11-07 DIAGNOSIS — I5032 Chronic diastolic (congestive) heart failure: Secondary | ICD-10-CM

## 2014-11-07 DIAGNOSIS — E785 Hyperlipidemia, unspecified: Secondary | ICD-10-CM

## 2014-11-07 DIAGNOSIS — E118 Type 2 diabetes mellitus with unspecified complications: Secondary | ICD-10-CM | POA: Diagnosis not present

## 2014-11-07 DIAGNOSIS — K5900 Constipation, unspecified: Secondary | ICD-10-CM | POA: Diagnosis not present

## 2014-11-07 DIAGNOSIS — C50912 Malignant neoplasm of unspecified site of left female breast: Secondary | ICD-10-CM | POA: Diagnosis not present

## 2014-11-07 DIAGNOSIS — E876 Hypokalemia: Secondary | ICD-10-CM

## 2014-11-07 DIAGNOSIS — J449 Chronic obstructive pulmonary disease, unspecified: Secondary | ICD-10-CM | POA: Diagnosis not present

## 2014-11-07 DIAGNOSIS — D62 Acute posthemorrhagic anemia: Secondary | ICD-10-CM

## 2014-11-07 DIAGNOSIS — M1612 Unilateral primary osteoarthritis, left hip: Secondary | ICD-10-CM | POA: Diagnosis not present

## 2014-11-07 NOTE — Progress Notes (Signed)
Patient ID: [name], female   DOB: January 22, 1932, 81 y.o.   MRN: [id number]    DATE:  11/07/2014 MRN:  [id number]  BIRTHDAY: [birth year]  Facility:  Nursing Home Location:  [place] Room Number: 1202-P  LEVEL OF CARE:  SNF [phone])  Contact Information    Name [place] Work [address]   Dorthey, Depace Son [phone]  [phone]   Silveria, Botz Relative [phone]  [phone]   Kymberley, Raz Daughter [phone]    Hardaway,[name] Daughter [phone]         Chief Complaint  Patient presents with  . Hospitalization Follow-up    Osteoarthritis S/P left total hip arthroplasty, hyperlipidemia, constipation, left breast cancer, diabetes mellitus, hypokalemia and chronic diastolic heart failure    HISTORY OF PRESENT ILLNESS:  This is an 81 year old female was being admitted to [facility] on 11/03/14 from [facility]. She has PMH of chronic renal insufficiency stage III, diabetes mellitus, tobacco abuse, chronic diastolic heart failure, pacemaker, atrial fibrillation, hyperlipidemia, GERD, COPD and left invasive ductal carcinoma of the breast. She has osteoarthritis for which she had left total hip arthroplasty on 10/31/14.  She has been admitted for a short-term rehabilitation.  PAST MEDICAL HISTORY:  Past Medical History  Diagnosis Date  . Amiodarone pulmonary toxicity   . Chronic renal insufficiency, stage III (moderate)     CrCl about 60 ml/min  . Diabetes   . Tobacco abuse   . Diastolic heart failure     Acute on Chronic  . Pacemaker     Permanent  . AF (atrial fibrillation)      AV ablation 9/09 [place] per Dr Ola Spurr - AV node ablation 9/11 Dr Caryl Comes  . Hyperlipidemia   . GERD (gastroesophageal reflux disease)   . [name] (coronary artery disease)     (not sure of this 11/10  . COPD (chronic obstructive pulmonary disease)     emphysema -FeV1 73% DLCO 53% 5/09  . Invasive ductal carcinoma of breast  2011    LEFT   . OA (osteoarthritis) of knee     RIGHT  . Right sided sciatica   . Sinoatrial node dysfunction   . CHF (congestive heart failure)   . Mental disorder   . Acute blood loss anemia 04/20/2012  . Depression 06/06/2012  . Hx of cardiovascular stress test     Lexiscan Myoview (09/2013):  No definite ischemia, EF 67%; low risk  . PAD (peripheral artery disease)     w/hx right iliac/SFA stenting and left and rt leg PTA  . CVA (cerebral vascular accident)     no residual effects evident to pt   . History of skin cancer   . Eczema   . Complication of anesthesia     "psycotic episode" after hip surg - resolved  . Sleep apnea     associated with hypersomnia uses O2 2L/M at night and during naps also uses CPAP  . Shortness of breath dyspnea     walking distances   . Pneumonia     hx of   . Asthma   . Bronchitis     hx of   . Pain     pt states has pain in fingertips per right hand pt states has been told may be carpal tunnel  . Tremors of nervous system     hands bilat      CURRENT MEDICATIONS: Reviewed  Patient's Medications  New Prescriptions   No medications on  file  Previous Medications   ACETAMINOPHEN (TYLENOL) 650 MG CR TABLET    Take 1,300 mg by mouth every 8 (eight) hours as needed for pain.   APIXABAN (ELIQUIS) 5 MG TABS TABLET    Take 1 tablet (5 mg total) by mouth 2 (two) times daily.   ATENOLOL (TENORMIN) 25 MG TABLET    Take 1 tablet (25 mg total) by mouth every morning.   ATORVASTATIN (LIPITOR) 20 MG TABLET    TAKE 1 TABLET EVERY DAY   BIOTIN (BIOTIN 5000) 5 MG CAPS    Take 1 capsule by mouth at bedtime.   BISACODYL (DULCOLAX) 10 MG SUPPOSITORY    Place 1 suppository (10 mg total) rectally daily as needed for moderate constipation.   CILOSTAZOL (PLETAL) 100 MG TABLET    Take 100 mg by mouth 2 (two) times daily.   DOCUSATE SODIUM (COLACE) 100 MG CAPSULE    Take 1 capsule (100 mg total) by mouth 2 (two) times daily as needed for mild constipation.    FLUTICASONE-SALMETEROL (ADVAIR) 250-50 MCG/DOSE AEPB    Inhale 1 puff into the lungs 2 (two) times daily.   LETROZOLE (FEMARA) 2.5 MG TABLET    TAKE 1 TABLET (2.5 MG TOTAL) BY MOUTH DAILY.   LEVALBUTEROL (XOPENEX HFA) 45 MCG/ACT INHALER    Inhale 2 puffs into the lungs every 6 (six) hours as needed. For shortness of breath   METFORMIN (GLUCOPHAGE) 500 MG TABLET    TAKE 1 TABLET TWICE DAILY WITH A MEAL   METHOCARBAMOL (ROBAXIN) 500 MG TABLET    Take 1 tablet (500 mg total) by mouth every 6 (six) hours as needed for muscle spasms.   METOCLOPRAMIDE (REGLAN) 5 MG TABLET    Take 1 tablet (5 mg total) by mouth every 8 (eight) hours as needed for nausea (if ondansetron (ZOFRAN) ineffective.).   MOMETASONE (NASONEX) 50 MCG/ACT NASAL SPRAY    Place 2 sprays into the nose daily as needed (allergies).    MULTIPLE VITAMINS-MINERALS (MULTIVITAMIN WITH MINERALS) TABLET    Take 1 tablet by mouth 2 (two) times daily.    NON FORMULARY    Take 1 capsule by mouth 2 (two) times daily. procera   ONDANSETRON (ZOFRAN) 4 MG TABLET    Take 1 tablet (4 mg total) by mouth every 6 (six) hours as needed for nausea.   OXYCODONE (OXY IR/ROXICODONE) 5 MG IMMEDIATE RELEASE TABLET    Take 1-2 tablets (5-10 mg total) by mouth every 3 (three) hours as needed for moderate pain, severe pain or breakthrough pain.   POLYETHYLENE GLYCOL (MIRALAX / GLYCOLAX) PACKET    Take 17 g by mouth daily as needed for mild constipation.   POTASSIUM CHLORIDE SA (K-DUR,KLOR-CON) 20 MEQ TABLET    Take 1 tablet (20 mEq total) by mouth daily.   SENNA-DOCUSATE (SENOKOT-S) 8.6-50 MG PER TABLET    Take 1 tablet by mouth 2 (two) times daily. Stop for diarrhea or loose stools   SODIUM PHOSPHATE (FLEET) 7-19 GM/118ML ENEM    Place 133 mLs (1 enema total) rectally once as needed for severe constipation.   SPIRIVA HANDIHALER 18 MCG INHALATION CAPSULE    INHALE THE CONTENTS OF 1 CAPSULE EVERY DAY   TORSEMIDE (DEMADEX) 10 MG TABLET    Take 1 tablet (10 mg total) by  mouth daily.   TRAMADOL (ULTRAM) 50 MG TABLET    Take 1-2 tablets (50-100 mg total) by mouth every 6 (six) hours as needed (mild pain).  Modified Medications  No medications on file  Discontinued Medications   No medications on file     Allergies  Allergen Reactions  . Cholestatin     RAGWEED SEASON...sneezing   . Sulfur Itching     REVIEW OF SYSTEMS:  GENERAL: no change in appetite, no fatigue, no weight changes, no fever, chills or weakness EYES: Denies change in vision, dry eyes, eye pain, itching or discharge EARS: Denies change in hearing, ringing in ears, or earache NOSE: Denies nasal congestion or epistaxis MOUTH and THROAT: Denies oral discomfort, gingival pain or bleeding, pain from teeth or hoarseness   RESPIRATORY: no cough, SOB, DOE, wheezing, hemoptysis CARDIAC: no chest pain, edema or palpitations GI: no abdominal pain, diarrhea, constipation, heart burn, nausea or vomiting GU: Denies dysuria, frequency, hematuria, incontinence, or discharge PSYCHIATRIC: Denies feeling of depression or anxiety. No report of hallucinations, insomnia, paranoia, or agitation   PHYSICAL EXAMINATION  GENERAL APPEARANCE: Well nourished. In no acute distress. Obese SKIN:  Left hip with steri-strips, dry, no erythema HEAD: Normal in size and contour. No evidence of trauma EYES: Lids open and close normally. No blepharitis, entropion or ectropion. PERRL. Conjunctivae are clear and sclerae are white. Lenses are without opacity EARS: Pinnae are normal. Patient hears normal voice tunes of the examiner MOUTH and THROAT: Lips are without lesions. Oral mucosa is moist and without lesions. Tongue is normal in shape, size, and color and without lesions NECK: supple, trachea midline, no neck masses, no thyroid tenderness, no thyromegaly LYMPHATICS: no LAN in the neck, no supraclavicular LAN RESPIRATORY: breathing is even & unlabored, BS CTAB CARDIAC: RRR, no murmur,no extra heart sounds, nLLE  edema 1+; left chest pacemaker GI: abdomen soft, normal BS, no masses, no tenderness, no hepatomegaly, no splenomegaly EXTREMITIES:  Able to move 4 extremities PSYCHIATRIC: Alert and oriented X 3. Affect and behavior are appropriate  LABS/RADIOLOGY: Labs reviewed: Basic Metabolic Panel:  Recent Labs  10/24/14 1500 11/01/14 0415 11/02/14 0532  NA 137 137 137  K 4.3 4.5 4.6  CL 102 107 107  CO2 '25 22 22  '$ GLUCOSE 113* 175* 110*  BUN '18 13 15  '$ CREATININE 0.90 0.74 0.77  CALCIUM 9.7 8.4* 8.4*   Liver Function Tests:  Recent Labs  01/10/14 1105 10/24/14 1500  AST 13 18  ALT 7 14  ALKPHOS 82 72  BILITOT 0.41 0.4  PROT 7.3 8.1  ALBUMIN 3.4* 3.3*   CBC:  Recent Labs  01/10/14 1105  11/01/14 0415 11/02/14 0532 11/03/14 0432  WBC 8.9  < > 8.2 11.8* 9.5  NEUTROABS 5.9  --   --   --   --   HGB 12.1  < > 8.6* 9.2* 9.0*  HCT 37.5  < > 27.5* 29.5* 28.8*  MCV 87.2  < > 82.8 83.3 82.5  PLT 247  < > 290 338 321  < > = values in this interval not displayed.  CBG:  Recent Labs  11/02/14 2225 11/03/14 0745 11/03/14 1146  GLUCAP 154* 127* 154*      Dg Chest 2 View  10/24/2014   CLINICAL DATA:  Preop hip replacement. Diabetes, history of atrial fibrillation.  EXAM: CHEST  2 VIEW  COMPARISON:  10/17/2013  FINDINGS: Left pacer remains in place, unchanged. Heart is borderline in size. No confluent airspace opacities or effusions. No acute bony abnormality.  IMPRESSION: No active cardiopulmonary disease.   Electronically Signed   By: Rolm Baptise M.D.   On: 10/24/2014 15:59   Dg Pelvis Portable  10/31/2014   CLINICAL DATA:  Status post left hip replacement.  EXAM: PORTABLE PELVIS 1-2 VIEWS  COMPARISON:  April 25, 2012.  FINDINGS: Status post left total hip arthroplasty. Old surgical fixation of left acetabular fracture is again noted. The femoral and acetabular components of the prosthesis appear intact. Surgical drain and expected amount of postoperative soft tissue gas  is noted.  IMPRESSION: Status post left total hip arthroplasty.   Electronically Signed   By: Marijo Conception, M.D.   On: 10/31/2014 12:05    ASSESSMENT/PLAN:  Osteoarthritis S/P left total hip arthroplasty - for rehabilitation; continue Robaxin 500 mg 1 tab by mouth every 6 hours when necessary for muscle spasm; oxycodone 5 mg 1-2 tabs by mouth every 3 hours when necessary and tramadol 50 mg 1-2 tabs by mouth every 6 hours when necessary for pain; Eliquis 5 mg 1 tab by mouth twice a day for DVT prophylaxis; LLE WBAT: Follow-up with Dr. Wynelle Link, orthopedic surgeon; on 11/13/14  Hyperlipidemia - continue atorvastatin 20 mg 1 tab by mouth daily  Constipation - continue bisacodyl 10 mg suppository 1 rectally daily when necessary, Colace 100 mg 1 capsule by mouth twice a day when necessary, MiraLAX 17 g by mouth daily when necessary and senna S1 tab by mouth twice a day  COPD - continue Advair 250-50 g/dose 1 puff into the lungs twice a day and Spiriva handihaler 18 g inhale 1 capsule content daily  Left breast cancer - continue Femara 2.5 mg 1 tab by mouth daily  Diabetes mellitus, type II - hemoglobin A1c 7.3; continue metformin 500 mg 1 tab by mouth twice a day  Hypokalemia - K4.6; continue K-Dur 20 meq one tab by mouth daily  Chronic diastolic failure - stable; continue Demadex 10 mg 1 tab by mouth daily; check BMP  Anemia, acute blood loss - hemoglobin 9.0; check CBC     Goals of care:  Short-term    [facility], NP [place] [phone]

## 2014-11-08 ENCOUNTER — Non-Acute Institutional Stay (SKILLED_NURSING_FACILITY): Payer: Medicare Other | Admitting: Internal Medicine

## 2014-11-08 DIAGNOSIS — K5900 Constipation, unspecified: Secondary | ICD-10-CM | POA: Diagnosis not present

## 2014-11-08 DIAGNOSIS — C50912 Malignant neoplasm of unspecified site of left female breast: Secondary | ICD-10-CM

## 2014-11-08 DIAGNOSIS — E785 Hyperlipidemia, unspecified: Secondary | ICD-10-CM

## 2014-11-08 DIAGNOSIS — I5032 Chronic diastolic (congestive) heart failure: Secondary | ICD-10-CM

## 2014-11-08 DIAGNOSIS — J449 Chronic obstructive pulmonary disease, unspecified: Secondary | ICD-10-CM | POA: Diagnosis not present

## 2014-11-08 DIAGNOSIS — G4733 Obstructive sleep apnea (adult) (pediatric): Secondary | ICD-10-CM

## 2014-11-08 DIAGNOSIS — M1612 Unilateral primary osteoarthritis, left hip: Secondary | ICD-10-CM | POA: Diagnosis not present

## 2014-11-08 DIAGNOSIS — E118 Type 2 diabetes mellitus with unspecified complications: Secondary | ICD-10-CM

## 2014-11-09 ENCOUNTER — Encounter: Payer: Self-pay | Admitting: Adult Health

## 2014-11-09 ENCOUNTER — Non-Acute Institutional Stay (SKILLED_NURSING_FACILITY): Payer: Medicare Other | Admitting: Adult Health

## 2014-11-09 DIAGNOSIS — J449 Chronic obstructive pulmonary disease, unspecified: Secondary | ICD-10-CM

## 2014-11-09 DIAGNOSIS — I5032 Chronic diastolic (congestive) heart failure: Secondary | ICD-10-CM

## 2014-11-09 DIAGNOSIS — E785 Hyperlipidemia, unspecified: Secondary | ICD-10-CM | POA: Diagnosis not present

## 2014-11-09 DIAGNOSIS — K5900 Constipation, unspecified: Secondary | ICD-10-CM | POA: Diagnosis not present

## 2014-11-09 DIAGNOSIS — M1612 Unilateral primary osteoarthritis, left hip: Secondary | ICD-10-CM

## 2014-11-09 DIAGNOSIS — E118 Type 2 diabetes mellitus with unspecified complications: Secondary | ICD-10-CM | POA: Diagnosis not present

## 2014-11-09 DIAGNOSIS — D62 Acute posthemorrhagic anemia: Secondary | ICD-10-CM

## 2014-11-09 DIAGNOSIS — C50912 Malignant neoplasm of unspecified site of left female breast: Secondary | ICD-10-CM | POA: Diagnosis not present

## 2014-11-09 DIAGNOSIS — E876 Hypokalemia: Secondary | ICD-10-CM | POA: Diagnosis not present

## 2014-11-12 NOTE — Progress Notes (Signed)
Patient ID: [name], female   DOB: 22-Nov-1931, 81 y.o.   MRN: [id number]    DATE:  11/09/14 MRN:  [id number]  BIRTHDAY: 1931/11/24  Facility:  Nursing Home Location:  [place] Room Number: 1202-P  LEVEL OF CARE:  SNF [phone])  Contact Information    Name Relation Home Work [address]   Reed, Eifert Son [phone]   Deshara, Rossi Relative [phone]  [phone]   Journei, [place] Daughter [phone]    Hardaway,[name] Daughter [phone]       Chief Complaint  Patient presents with  . Discharge Note    Osteoarthritis S/P left total hip arthroplasty, hyperlipidemia, constipation, left breast cancer, diabetes mellitus, hypokalemia and chronic diastolic heart failure    HISTORY OF PRESENT ILLNESS:  This is an 81 year old female is for discharge home with Home health PT and OT. She has been admitted to [facility] on 11/03/14 from [facility]. She has PMH of chronic renal insufficiency stage III, diabetes mellitus, tobacco abuse, chronic diastolic heart failure, pacemaker, atrial fibrillation, hyperlipidemia, GERD, COPD and left invasive ductal carcinoma of the breast. She has osteoarthritis for which she had left total hip arthroplasty on 10/31/14.  Noted patient to be very sleepy but arouseable. She is able to answer questions appropriately. She verbalized that her pain is well-controlled and she has just taken pain medication. She is currently taking Oxycodone and Tramadol for pain.  Patient was admitted to this facility for short-term rehabilitation after the patient's recent hospitalization.  Patient has completed SNF rehabilitation and therapy has cleared the patient for discharge.   PAST MEDICAL HISTORY:  Past Medical History  Diagnosis Date  . Amiodarone pulmonary toxicity   . Chronic renal insufficiency, stage III (moderate)     CrCl about 60 ml/min  .  Diabetes   . Tobacco abuse   . Diastolic heart failure     Acute on Chronic  . Pacemaker     Permanent  . AF (atrial fibrillation)      AV ablation 9/09 [place] per Dr Ola Spurr - AV node ablation 9/11 Dr Caryl Comes  . Hyperlipidemia   . GERD (gastroesophageal reflux disease)   . [name] (coronary artery disease)     (not sure of this 11/10  . COPD (chronic obstructive pulmonary disease)     emphysema -FeV1 73% DLCO 53% 5/09  . Invasive ductal carcinoma of breast 2011    LEFT   . OA (osteoarthritis) of knee     RIGHT  . Right sided sciatica   . Sinoatrial node dysfunction   . CHF (congestive heart failure)   . Mental disorder   . Acute blood loss anemia 04/20/2012  . Depression 06/06/2012  . Hx of cardiovascular stress test     Lexiscan Myoview (09/2013):  No definite ischemia, EF 67%; low risk  . PAD (peripheral artery disease)     w/hx right iliac/SFA stenting and left and rt leg PTA  . CVA (cerebral vascular accident)     no residual effects evident to pt   . History of skin cancer   . Eczema   . Complication of anesthesia     "psycotic episode" after hip surg - resolved  . Sleep apnea     associated with hypersomnia uses O2 2L/M at night and during naps also uses CPAP  . Shortness of breath dyspnea     walking distances   . Pneumonia     hx of   .  Asthma   . Bronchitis     hx of   . Pain     pt states has pain in fingertips per right hand pt states has been told may be carpal tunnel  . Tremors of nervous system     hands bilat      CURRENT MEDICATIONS: Reviewed  Patient's Medications  New Prescriptions   No medications on file  Previous Medications   ACETAMINOPHEN (TYLENOL) 650 MG CR TABLET    Take 1,300 mg by mouth every 8 (eight) hours as needed for pain.   APIXABAN (ELIQUIS) 5 MG TABS TABLET    Take 1 tablet (5 mg total) by mouth 2 (two) times daily.   ATENOLOL (TENORMIN) 25 MG TABLET    Take 1 tablet (25 mg total) by mouth every morning.   ATORVASTATIN (LIPITOR)  20 MG TABLET    TAKE 1 TABLET EVERY DAY   BIOTIN (BIOTIN 5000) 5 MG CAPS    Take 1 capsule by mouth at bedtime.   BISACODYL (DULCOLAX) 10 MG SUPPOSITORY    Place 1 suppository (10 mg total) rectally daily as needed for moderate constipation.   BUDESONIDE-FORMOTEROL (SYMBICORT) 160-4.5 MCG/ACT INHALER    Inhale 2 puffs into the lungs 2 (two) times daily.   CILOSTAZOL (PLETAL) 100 MG TABLET    Take 100 mg by mouth 2 (two) times daily.   DOCUSATE SODIUM (COLACE) 100 MG CAPSULE    Take 1 capsule (100 mg total) by mouth 2 (two) times daily as needed for mild constipation.   FLUTICASONE (FLONASE) 50 MCG/ACT NASAL SPRAY    Place 2 sprays into both nostrils daily as needed for allergies or rhinitis.   LETROZOLE (FEMARA) 2.5 MG TABLET    TAKE 1 TABLET (2.5 MG TOTAL) BY MOUTH DAILY.   LEVALBUTEROL (XOPENEX HFA) 45 MCG/ACT INHALER    Inhale 2 puffs into the lungs every 6 (six) hours as needed. For shortness of breath   METFORMIN (GLUCOPHAGE) 500 MG TABLET    TAKE 1 TABLET TWICE DAILY WITH A MEAL   METHOCARBAMOL (ROBAXIN) 500 MG TABLET    Take 1 tablet (500 mg total) by mouth every 6 (six) hours as needed for muscle spasms.   METOCLOPRAMIDE (REGLAN) 5 MG TABLET    Take 1 tablet (5 mg total) by mouth every 8 (eight) hours as needed for nausea (if ondansetron (ZOFRAN) ineffective.).   MULTIPLE VITAMINS-MINERALS (MULTIVITAMIN WITH MINERALS) TABLET    Take 1 tablet by mouth 2 (two) times daily.    ONDANSETRON (ZOFRAN) 4 MG TABLET    Take 1 tablet (4 mg total) by mouth every 6 (six) hours as needed for nausea.   OXYCODONE (OXY IR/ROXICODONE) 5 MG IMMEDIATE RELEASE TABLET    Take 1-2 tablets (5-10 mg total) by mouth every 3 (three) hours as needed for moderate pain, severe pain or breakthrough pain.   POLYETHYLENE GLYCOL (MIRALAX / GLYCOLAX) PACKET    Take 17 g by mouth daily as needed for mild constipation.   POTASSIUM CHLORIDE SA (K-DUR,KLOR-CON) 20 MEQ TABLET    Take 1 tablet (20 mEq total) by mouth daily.    SENNA-DOCUSATE (SENOKOT-S) 8.6-50 MG PER TABLET    Take 1 tablet by mouth 2 (two) times daily. Stop for diarrhea or loose stools   SODIUM PHOSPHATE (FLEET) 7-19 GM/118ML ENEM    Place 133 mLs (1 enema total) rectally once as needed for severe constipation.   SPIRIVA HANDIHALER 18 MCG INHALATION CAPSULE    INHALE THE CONTENTS OF 1  CAPSULE EVERY DAY   TORSEMIDE (DEMADEX) 10 MG TABLET    Take 1 tablet (10 mg total) by mouth daily.   TRAMADOL (ULTRAM) 50 MG TABLET    Take 1-2 tablets (50-100 mg total) by mouth every 6 (six) hours as needed (mild pain).  Modified Medications   No medications on file  Discontinued Medications   No medications on file     Allergies  Allergen Reactions  . Cholestatin     RAGWEED SEASON...sneezing   . Sulfur Itching     REVIEW OF SYSTEMS:  GENERAL: no change in appetite, no fatigue, no weight changes, no fever, chills or weakness EYES: Denies change in vision, dry eyes, eye pain, itching or discharge EARS: Denies change in hearing, ringing in ears, or earache NOSE: Denies nasal congestion or epistaxis MOUTH and THROAT: Denies oral discomfort, gingival pain or bleeding, pain from teeth or hoarseness   RESPIRATORY: no cough, SOB, DOE, wheezing, hemoptysis CARDIAC: no chest pain, edema or palpitations GI: no abdominal pain, diarrhea, constipation, heart burn, nausea or vomiting GU: Denies dysuria, frequency, hematuria, incontinence, or discharge PSYCHIATRIC: Denies feeling of depression or anxiety. No report of hallucinations, insomnia, paranoia, or agitation   PHYSICAL EXAMINATION  GENERAL APPEARANCE: Well nourished. In no acute distress. Obese SKIN:  Left hip with steri-strips, dry, no erythema HEAD: Normal in size and contour. No evidence of trauma EYES: Lids open and close normally. No blepharitis, entropion or ectropion. PERRL. Conjunctivae are clear and sclerae are white. Lenses are without opacity EARS: Pinnae are normal. Patient hears normal  voice tunes of the examiner MOUTH and THROAT: Lips are without lesions. Oral mucosa is moist and without lesions. Tongue is normal in shape, size, and color and without lesions NECK: supple, trachea midline, no neck masses, no thyroid tenderness, no thyromegaly LYMPHATICS: no LAN in the neck, no supraclavicular LAN RESPIRATORY: breathing is even & unlabored, BS CTAB CARDIAC: RRR, no murmur,no extra heart sounds, nLLE edema 1+; left chest pacemaker GI: abdomen soft, normal BS, no masses, no tenderness, no hepatomegaly, no splenomegaly EXTREMITIES:  Able to move 4 extremities PSYCHIATRIC: Alert and oriented X 3. Affect and behavior are appropriate  LABS/RADIOLOGY: Labs reviewed: 11/08/14   WBC 9.0 hemoglobin 8.3 hematocrit 28.0 MCV 84.3 platelet 370 sodium 140 potassium 4.1 glucose 128 BUN 9 creatinine 0.85 calcium 8.4 Basic Metabolic Panel:  Recent Labs  10/24/14 1500 11/01/14 0415 11/02/14 0532  NA 137 137 137  K 4.3 4.5 4.6  CL 102 107 107  CO2 '25 22 22  '$ GLUCOSE 113* 175* 110*  BUN '18 13 15  '$ CREATININE 0.90 0.74 0.77  CALCIUM 9.7 8.4* 8.4*   Liver Function Tests:  Recent Labs  01/10/14 1105 10/24/14 1500  AST 13 18  ALT 7 14  ALKPHOS 82 72  BILITOT 0.41 0.4  PROT 7.3 8.1  ALBUMIN 3.4* 3.3*   CBC:  Recent Labs  01/10/14 1105  11/01/14 0415 11/02/14 0532 11/03/14 0432  WBC 8.9  < > 8.2 11.8* 9.5  NEUTROABS 5.9  --   --   --   --   HGB 12.1  < > 8.6* 9.2* 9.0*  HCT 37.5  < > 27.5* 29.5* 28.8*  MCV 87.2  < > 82.8 83.3 82.5  PLT 247  < > 290 338 321  < > = values in this interval not displayed.  CBG:  Recent Labs  11/02/14 2225 11/03/14 0745 11/03/14 1146  GLUCAP 154* 127* 154*      Dg Chest  2 View  10/24/2014   CLINICAL DATA:  Preop hip replacement. Diabetes, history of atrial fibrillation.  EXAM: CHEST  2 VIEW  COMPARISON:  10/17/2013  FINDINGS: Left pacer remains in place, unchanged. Heart is borderline in size. No confluent airspace opacities or  effusions. No acute bony abnormality.  IMPRESSION: No active cardiopulmonary disease.   Electronically Signed   By: Rolm Baptise M.D.   On: 10/24/2014 15:59   Dg Pelvis Portable  10/31/2014   CLINICAL DATA:  Status post left hip replacement.  EXAM: PORTABLE PELVIS 1-2 VIEWS  COMPARISON:  April 25, 2012.  FINDINGS: Status post left total hip arthroplasty. Old surgical fixation of left acetabular fracture is again noted. The femoral and acetabular components of the prosthesis appear intact. Surgical drain and expected amount of postoperative soft tissue gas is noted.  IMPRESSION: Status post left total hip arthroplasty.   Electronically Signed   By: Marijo Conception, M.D.   On: 10/31/2014 12:05    ASSESSMENT/PLAN:  Osteoarthritis S/P left total hip arthroplasty - for Home health PT and OT; continue Robaxin 500 mg 1 tab by mouth every 6 hours when necessary for muscle spasm; discontinue oxycodone and tramadol and start Tylenol 500 mg 2 tabs =1,000 mg by mouth every 8 hours when necessary for pain; Eliquis 5 mg 1 tab by mouth twice a day for DVT prophylaxis; LLE WBAT: Follow-up with Dr. Wynelle Link, orthopedic surgeon; on 11/13/14  Hyperlipidemia - continue atorvastatin 20 mg 1 tab by mouth daily  Constipation - continue bisacodyl 10 mg suppository 1 rectally daily when necessary, Colace 100 mg 1 capsule by mouth twice a day when necessary, MiraLAX 17 g by mouth daily when necessary and senna S1 tab by mouth twice a day  COPD - continue Advair 250-50 g/dose 1 puff into the lungs twice a day and Spiriva handihaler 18 g inhale 1 capsule content daily  Left breast cancer - continue Femara 2.5 mg 1 tab by mouth daily  Diabetes mellitus, type II - hemoglobin A1c 7.3; continue metformin 500 mg 1 tab by mouth twice a day  Hypokalemia - K4.1; continue K-Dur 20 meq one tab by mouth BID  Chronic diastolic failure - stable; continue Demadex 10 mg 1 tab by mouth daily; check BMP  Anemia, acute blood loss -  hemoglobin 8.3 ; check CBC in 1 week      I have filled out patient's discharge paperwork and written prescriptions.  Patient will receive home health PT and OT.  Total discharge time: Less than 30 minutes  Discharge time involved coordination of the discharge process with Education officer, museum, nursing staff and therapy department. Medical justification for home health services verified.   [facility], NP [employer] [phone]

## 2014-11-13 ENCOUNTER — Telehealth: Payer: Self-pay | Admitting: Internal Medicine

## 2014-11-13 DIAGNOSIS — Z96642 Presence of left artificial hip joint: Secondary | ICD-10-CM | POA: Diagnosis not present

## 2014-11-13 DIAGNOSIS — Z471 Aftercare following joint replacement surgery: Secondary | ICD-10-CM | POA: Diagnosis not present

## 2014-11-13 DIAGNOSIS — I739 Peripheral vascular disease, unspecified: Secondary | ICD-10-CM | POA: Diagnosis not present

## 2014-11-13 DIAGNOSIS — I2510 Atherosclerotic heart disease of native coronary artery without angina pectoris: Secondary | ICD-10-CM | POA: Diagnosis not present

## 2014-11-13 DIAGNOSIS — I5032 Chronic diastolic (congestive) heart failure: Secondary | ICD-10-CM | POA: Diagnosis not present

## 2014-11-13 DIAGNOSIS — E119 Type 2 diabetes mellitus without complications: Secondary | ICD-10-CM | POA: Diagnosis not present

## 2014-11-13 DIAGNOSIS — J449 Chronic obstructive pulmonary disease, unspecified: Secondary | ICD-10-CM | POA: Diagnosis not present

## 2014-11-13 NOTE — Telephone Encounter (Signed)
Kim from [place]  Need verbals for PT  2 week 8  For Nursing services med management, Social work for [employer] rescourse and OT 1 month 1

## 2014-11-14 NOTE — Telephone Encounter (Signed)
Called Kim and LVM for her to call back.

## 2014-11-14 NOTE — Telephone Encounter (Signed)
[name] has returned your call

## 2014-11-15 NOTE — Telephone Encounter (Signed)
Gave verbal orders for social work, OT, and medication management.

## 2014-11-16 ENCOUNTER — Other Ambulatory Visit (INDEPENDENT_AMBULATORY_CARE_PROVIDER_SITE_OTHER): Payer: Medicare Other

## 2014-11-16 ENCOUNTER — Encounter: Payer: Self-pay | Admitting: Internal Medicine

## 2014-11-16 ENCOUNTER — Ambulatory Visit (INDEPENDENT_AMBULATORY_CARE_PROVIDER_SITE_OTHER): Payer: Medicare Other | Admitting: Internal Medicine

## 2014-11-16 VITALS — BP 80/58 | HR 70 | Temp 98.3°F | Resp 16 | Ht 65.0 in | Wt 168.2 lb

## 2014-11-16 DIAGNOSIS — E118 Type 2 diabetes mellitus with unspecified complications: Secondary | ICD-10-CM

## 2014-11-16 DIAGNOSIS — I5032 Chronic diastolic (congestive) heart failure: Secondary | ICD-10-CM | POA: Diagnosis not present

## 2014-11-16 DIAGNOSIS — F17211 Nicotine dependence, cigarettes, in remission: Secondary | ICD-10-CM

## 2014-11-16 DIAGNOSIS — M8580 Other specified disorders of bone density and structure, unspecified site: Secondary | ICD-10-CM

## 2014-11-16 DIAGNOSIS — M1612 Unilateral primary osteoarthritis, left hip: Secondary | ICD-10-CM

## 2014-11-16 DIAGNOSIS — I6523 Occlusion and stenosis of bilateral carotid arteries: Secondary | ICD-10-CM

## 2014-11-16 DIAGNOSIS — J449 Chronic obstructive pulmonary disease, unspecified: Secondary | ICD-10-CM | POA: Diagnosis not present

## 2014-11-16 DIAGNOSIS — D62 Acute posthemorrhagic anemia: Secondary | ICD-10-CM

## 2014-11-16 DIAGNOSIS — I739 Peripheral vascular disease, unspecified: Secondary | ICD-10-CM | POA: Diagnosis not present

## 2014-11-16 DIAGNOSIS — E119 Type 2 diabetes mellitus without complications: Secondary | ICD-10-CM

## 2014-11-16 DIAGNOSIS — Z471 Aftercare following joint replacement surgery: Secondary | ICD-10-CM | POA: Diagnosis not present

## 2014-11-16 DIAGNOSIS — J4489 Other specified chronic obstructive pulmonary disease: Secondary | ICD-10-CM

## 2014-11-16 DIAGNOSIS — I2510 Atherosclerotic heart disease of native coronary artery without angina pectoris: Secondary | ICD-10-CM | POA: Diagnosis not present

## 2014-11-16 LAB — HEMOGLOBIN A1C: HEMOGLOBIN A1C: 6.7 % — AB (ref 4.6–6.5)

## 2014-11-16 LAB — CBC
HCT: 31.1 % — ABNORMAL LOW (ref 36.0–46.0)
Hemoglobin: 10 g/dL — ABNORMAL LOW (ref 12.0–15.0)
MCHC: 32.1 g/dL (ref 30.0–36.0)
MCV: 80.2 fl (ref 78.0–100.0)
Platelets: 442 10*3/uL — ABNORMAL HIGH (ref 150.0–400.0)
RBC: 3.88 Mil/uL (ref 3.87–5.11)
RDW: 20.7 % — AB (ref 11.5–15.5)
WBC: 8.9 10*3/uL (ref 4.0–10.5)

## 2014-11-16 LAB — FERRITIN: Ferritin: 269.7 ng/mL (ref 10.0–291.0)

## 2014-11-16 NOTE — Progress Notes (Signed)
Pre visit review using our clinic review tool, if applicable. No additional management support is needed unless otherwise documented below in the visit note. 

## 2014-11-16 NOTE — Patient Instructions (Addendum)
We think the medicines that you do not need are:  Reglan (metoclopramide)  No Enemas  Robaxin (methocarbamol)  Colace (docusate) pills or suppository  Miralax  Zofran (except if you have nausea)  Oxycodone  Tramadol (except for if you have bad pain that tylenol does not help)  Use the senokot-d once per day. This should help with the bowels and not give you diarrhea.   WE are checking the blood work today and will call you back with the results. We will also work on getting the bed ordered.   Come back in about 2-3 months so we can see how you are doing. If you have any new problems or questions before then please feel free to call us.

## 2014-11-17 ENCOUNTER — Encounter: Payer: Self-pay | Admitting: Internal Medicine

## 2014-11-17 NOTE — Assessment & Plan Note (Signed)
Due to her breathing condition she requires a bed with adjustable head region. In addition she needs

## 2014-11-17 NOTE — Assessment & Plan Note (Signed)
No flare today, does contribute to her need for an adjustable hospital bed because she needs leg region adjustable for her edema.

## 2014-11-17 NOTE — Assessment & Plan Note (Signed)
No recent fractures and last dexa 2015 not due yet. No falls at home and taking vitamin D and calcium.

## 2014-11-17 NOTE — Assessment & Plan Note (Signed)
Still in recovery of hip replacement. Overall mobility is improving but not great. Checking CBC and ferritin for stability of blood counts. Start iron if needed.

## 2014-11-17 NOTE — Assessment & Plan Note (Signed)
Currently on metformin and checking HgA1c today. Due to other acute concerns not able to address fully and will re-address at next visit.

## 2014-11-17 NOTE — Assessment & Plan Note (Signed)
Still not smoking and encouraged her to remain smoke free.

## 2014-11-17 NOTE — Progress Notes (Signed)
   Subjective:    Patient ID: [name], female    DOB: [birth year], 81 y.o.   MRN: [id number]  HPI The patient is an 81 YO female coming in for hospital follow of  (in for hip replacement and then stayed in rehab for several days afterwards). She is now home and recovering. Still working with PT and OT at home. Not able to get up from her bed. She does have a very old adjustable bed which was started due to her breathing requirements and swelling in her legs which required this kind of bed. No falls since being home although not back to her energy levels. Mobility is still severely impaired.   Due to current medical condition of COPD stage gold C and chronic diastolic heart failure the patient requires positioning of the body in ways not feasible with an ordinary bed. Pillows and wedges have been considered and ruled out. Patient requires a bed height different than a fixed height hospital bed to permit transfers. The patient also requires frequent changes in body position due to their condition.   Review of Systems  Constitutional: Positive for activity change. Negative for fever, chills, appetite change, fatigue and unexpected weight change.  HENT: Negative for congestion and rhinorrhea.   Respiratory: Negative for cough, chest tightness, shortness of breath, wheezing and stridor.   Cardiovascular: Positive for leg swelling. Negative for chest pain and palpitations.  Gastrointestinal: Negative for nausea, vomiting, diarrhea and abdominal distention.  Musculoskeletal: Positive for arthralgias and gait problem.  Skin: Negative.   Neurological: Positive for weakness. Negative for dizziness, tremors, syncope, numbness and headaches.      Objective:   Physical Exam  Constitutional: She is oriented to person, place, and time. She appears well-developed and well-nourished. No distress.  HENT:  Head: Normocephalic and atraumatic.  Eyes: EOM are normal. Pupils are equal, round, and reactive  to light.  Neck: Normal range of motion. Neck supple. No JVD present.  Cardiovascular: Normal rate.   Paced  Pulmonary/Chest: Effort normal and breath sounds normal. No respiratory distress. She has no wheezes. She has no rales. She exhibits no tenderness.  Abdominal: Soft. Bowel sounds are normal. She exhibits no distension. There is no tenderness. There is no rebound and no guarding.  Neurological: She is alert and oriented to person, place, and time. No cranial nerve deficit.  Skin: Skin is warm and dry. She is not diaphoretic.    Filed Vitals:   11/16/14 1308  BP: 80/58  Pulse: 70  Temp: 98.3 F (36.8 C)  TempSrc: Oral  Resp: 16  Height: '5\' 5"'$  (1.651 m)  Weight: 168 lb 3.2 oz (76.295 kg)  SpO2: 97%      Assessment & Plan:

## 2014-11-19 DIAGNOSIS — J449 Chronic obstructive pulmonary disease, unspecified: Secondary | ICD-10-CM | POA: Diagnosis not present

## 2014-11-19 DIAGNOSIS — I2510 Atherosclerotic heart disease of native coronary artery without angina pectoris: Secondary | ICD-10-CM | POA: Diagnosis not present

## 2014-11-19 DIAGNOSIS — I5032 Chronic diastolic (congestive) heart failure: Secondary | ICD-10-CM | POA: Diagnosis not present

## 2014-11-19 DIAGNOSIS — Z471 Aftercare following joint replacement surgery: Secondary | ICD-10-CM | POA: Diagnosis not present

## 2014-11-19 DIAGNOSIS — E119 Type 2 diabetes mellitus without complications: Secondary | ICD-10-CM | POA: Diagnosis not present

## 2014-11-19 DIAGNOSIS — I739 Peripheral vascular disease, unspecified: Secondary | ICD-10-CM | POA: Diagnosis not present

## 2014-11-21 DIAGNOSIS — Z471 Aftercare following joint replacement surgery: Secondary | ICD-10-CM | POA: Diagnosis not present

## 2014-11-21 DIAGNOSIS — I739 Peripheral vascular disease, unspecified: Secondary | ICD-10-CM | POA: Diagnosis not present

## 2014-11-21 DIAGNOSIS — I5032 Chronic diastolic (congestive) heart failure: Secondary | ICD-10-CM | POA: Diagnosis not present

## 2014-11-21 DIAGNOSIS — J449 Chronic obstructive pulmonary disease, unspecified: Secondary | ICD-10-CM | POA: Diagnosis not present

## 2014-11-21 DIAGNOSIS — E119 Type 2 diabetes mellitus without complications: Secondary | ICD-10-CM | POA: Diagnosis not present

## 2014-11-21 DIAGNOSIS — I2510 Atherosclerotic heart disease of native coronary artery without angina pectoris: Secondary | ICD-10-CM | POA: Diagnosis not present

## 2014-11-23 ENCOUNTER — Telehealth: Payer: Self-pay | Admitting: Internal Medicine

## 2014-11-23 DIAGNOSIS — I2510 Atherosclerotic heart disease of native coronary artery without angina pectoris: Secondary | ICD-10-CM | POA: Diagnosis not present

## 2014-11-23 DIAGNOSIS — Z471 Aftercare following joint replacement surgery: Secondary | ICD-10-CM | POA: Diagnosis not present

## 2014-11-23 DIAGNOSIS — I739 Peripheral vascular disease, unspecified: Secondary | ICD-10-CM | POA: Diagnosis not present

## 2014-11-23 DIAGNOSIS — J449 Chronic obstructive pulmonary disease, unspecified: Secondary | ICD-10-CM | POA: Diagnosis not present

## 2014-11-23 DIAGNOSIS — I5032 Chronic diastolic (congestive) heart failure: Secondary | ICD-10-CM | POA: Diagnosis not present

## 2014-11-23 DIAGNOSIS — E119 Type 2 diabetes mellitus without complications: Secondary | ICD-10-CM | POA: Diagnosis not present

## 2014-11-23 NOTE — Telephone Encounter (Signed)
[name] called in and said that she needs a call back asap about some paperwork about pt hosp bed  Care taker  Best [phone]

## 2014-11-26 DIAGNOSIS — J449 Chronic obstructive pulmonary disease, unspecified: Secondary | ICD-10-CM | POA: Diagnosis not present

## 2014-11-26 DIAGNOSIS — E119 Type 2 diabetes mellitus without complications: Secondary | ICD-10-CM | POA: Diagnosis not present

## 2014-11-26 DIAGNOSIS — I5032 Chronic diastolic (congestive) heart failure: Secondary | ICD-10-CM | POA: Diagnosis not present

## 2014-11-26 DIAGNOSIS — I739 Peripheral vascular disease, unspecified: Secondary | ICD-10-CM | POA: Diagnosis not present

## 2014-11-26 DIAGNOSIS — Z471 Aftercare following joint replacement surgery: Secondary | ICD-10-CM | POA: Diagnosis not present

## 2014-11-26 DIAGNOSIS — I2510 Atherosclerotic heart disease of native coronary artery without angina pectoris: Secondary | ICD-10-CM | POA: Diagnosis not present

## 2014-11-26 NOTE — Telephone Encounter (Signed)
Left message for patient to call me back. 

## 2014-11-28 DIAGNOSIS — J449 Chronic obstructive pulmonary disease, unspecified: Secondary | ICD-10-CM | POA: Diagnosis not present

## 2014-11-28 DIAGNOSIS — I739 Peripheral vascular disease, unspecified: Secondary | ICD-10-CM | POA: Diagnosis not present

## 2014-11-28 DIAGNOSIS — Z471 Aftercare following joint replacement surgery: Secondary | ICD-10-CM | POA: Diagnosis not present

## 2014-11-28 DIAGNOSIS — E119 Type 2 diabetes mellitus without complications: Secondary | ICD-10-CM | POA: Diagnosis not present

## 2014-11-28 DIAGNOSIS — I2510 Atherosclerotic heart disease of native coronary artery without angina pectoris: Secondary | ICD-10-CM | POA: Diagnosis not present

## 2014-11-28 DIAGNOSIS — I5032 Chronic diastolic (congestive) heart failure: Secondary | ICD-10-CM | POA: Diagnosis not present

## 2014-11-30 DIAGNOSIS — I739 Peripheral vascular disease, unspecified: Secondary | ICD-10-CM | POA: Diagnosis not present

## 2014-11-30 DIAGNOSIS — I5032 Chronic diastolic (congestive) heart failure: Secondary | ICD-10-CM | POA: Diagnosis not present

## 2014-11-30 DIAGNOSIS — I2510 Atherosclerotic heart disease of native coronary artery without angina pectoris: Secondary | ICD-10-CM | POA: Diagnosis not present

## 2014-11-30 DIAGNOSIS — Z471 Aftercare following joint replacement surgery: Secondary | ICD-10-CM | POA: Diagnosis not present

## 2014-11-30 DIAGNOSIS — J449 Chronic obstructive pulmonary disease, unspecified: Secondary | ICD-10-CM | POA: Diagnosis not present

## 2014-11-30 DIAGNOSIS — E119 Type 2 diabetes mellitus without complications: Secondary | ICD-10-CM | POA: Diagnosis not present

## 2014-12-05 ENCOUNTER — Other Ambulatory Visit: Payer: Self-pay | Admitting: Oncology

## 2014-12-05 ENCOUNTER — Telehealth: Payer: Self-pay | Admitting: *Deleted

## 2014-12-05 ENCOUNTER — Other Ambulatory Visit: Payer: Self-pay

## 2014-12-05 DIAGNOSIS — Z853 Personal history of malignant neoplasm of breast: Secondary | ICD-10-CM

## 2014-12-05 DIAGNOSIS — Z9889 Other specified postprocedural states: Secondary | ICD-10-CM

## 2014-12-05 NOTE — Telephone Encounter (Signed)
Did she keep the bed or did [facility] take it back? If she kept the bed likely insurance will not pay for another bed.

## 2014-12-05 NOTE — Telephone Encounter (Signed)
Left msg on triage stating [facility] brought out hospital bed, but its not the correct bed. Needing md to send another rx for a [facility] bed. The other bed she is not able to get in or out...Johny Chess

## 2014-12-06 NOTE — Telephone Encounter (Signed)
Faxed script to [facility] & caregiver is made aware...Johny Chess

## 2014-12-06 NOTE — Telephone Encounter (Signed)
Signed. Given to Amy.

## 2014-12-06 NOTE — Telephone Encounter (Signed)
Called caregiver Daine Floras) she stated that yes [facility] drop the bed off 3 weeks ago, but she can not get in the bed. They called [facility] and was told that if md can write rx for [facility] hospital bed that they will pick the old one back up, and bring her new bed...Johny Chess

## 2014-12-07 DIAGNOSIS — Z96642 Presence of left artificial hip joint: Secondary | ICD-10-CM | POA: Diagnosis not present

## 2014-12-07 DIAGNOSIS — Z471 Aftercare following joint replacement surgery: Secondary | ICD-10-CM | POA: Diagnosis not present

## 2014-12-11 ENCOUNTER — Encounter: Payer: Self-pay | Admitting: *Deleted

## 2014-12-12 ENCOUNTER — Other Ambulatory Visit: Payer: Self-pay

## 2014-12-12 MED ORDER — TORSEMIDE 10 MG PO TABS: 10.0000 mg | ORAL_TABLET | Freq: Every day | ORAL | Status: DC

## 2014-12-12 MED ORDER — ATENOLOL 25 MG PO TABS: 25.0000 mg | ORAL_TABLET | Freq: Every morning | ORAL | Status: DC

## 2014-12-12 MED ORDER — LETROZOLE 2.5 MG PO TABS: ORAL_TABLET | ORAL | Status: DC

## 2014-12-12 MED ORDER — ATORVASTATIN CALCIUM 20 MG PO TABS: 20.0000 mg | ORAL_TABLET | Freq: Every day | ORAL | Status: DC

## 2014-12-24 ENCOUNTER — Encounter: Payer: Self-pay | Admitting: Pulmonary Disease

## 2014-12-24 ENCOUNTER — Ambulatory Visit (INDEPENDENT_AMBULATORY_CARE_PROVIDER_SITE_OTHER): Payer: Medicare Other | Admitting: Pulmonary Disease

## 2014-12-24 VITALS — BP 118/62 | HR 69 | Temp 98.2°F | Ht 65.5 in | Wt 169.4 lb

## 2014-12-24 DIAGNOSIS — I6523 Occlusion and stenosis of bilateral carotid arteries: Secondary | ICD-10-CM | POA: Diagnosis not present

## 2014-12-24 DIAGNOSIS — J449 Chronic obstructive pulmonary disease, unspecified: Secondary | ICD-10-CM | POA: Diagnosis not present

## 2014-12-24 DIAGNOSIS — Z23 Encounter for immunization: Secondary | ICD-10-CM | POA: Diagnosis not present

## 2014-12-24 NOTE — Progress Notes (Signed)
Subjective:    Patient ID: [name], female    DOB: 1931-08-19, 81 y.o.   MRN: [id number]  PROBLEM LIST COPD Heavy ex smoker OSA on CPAP  HPI [name] returns today for follow-up of COPD with frequent exacerbations in the past. She is a former patient of [name] and a heavy former smoker. She smoked one and half to 3 packs per day for nearly 60 years. She quit 3 years ago. She is actually improved after quitting in terms of her respiratory status. In the clinic today she does not have any complaints of dyspnea, cough, sputum production, wheezing.  She underwent right hip and total knee replacement to the past year. She has apparently tolerated these procedures well. She has history of atrial fibrillation and failed several ablations. She has a permanent pacemaker. There is a mention of amiodarone pulmonary toxicity in her problem list but I could not obtain any details of this in her past pulmonary notes. She is not on amiodarone at present.  Past Medical History  Diagnosis Date  . 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 Chronic renal insufficiency, stage III (moderate)     CrCl about 60 ml/min  . Diabetes ([place])   . Tobacco abuse   . Diastolic heart failure     Acute on Chronic  . Pacemaker     Permanent  . AF (atrial fibrillation) ([place])      AV ablation 9/09 [place] per Dr Ola Spurr - AV node ablation 9/11 Dr Caryl Comes  . Hyperlipidemia   . GERD (gastroesophageal reflux disease)   . [name] (coronary artery disease)     (not sure of this 11/10  . COPD (chronic obstructive pulmonary disease) (HCC)     emphysema -FeV1 73% DLCO 53% 5/09  . Invasive ductal carcinoma of breast ([place]) 2011    LEFT   . OA (osteoarthritis) of knee     RIGHT  . Right sided sciatica   . Sinoatrial node dysfunction (HCC)   . CHF (congestive heart failure) ([place])   . Mental disorder   . Acute blood loss anemia 04/20/2012  . Depression 06/06/2012  . Hx of cardiovascular stress test     Lexiscan  Myoview (09/2013):  No definite ischemia, EF 67%; low risk  . PAD (peripheral artery disease) (HCC)     w/hx right iliac/SFA stenting and left and rt leg PTA  . CVA (cerebral vascular accident) ([place])     no residual effects evident to pt   . History of skin cancer   . Eczema   . Complication of anesthesia     "psycotic episode" after hip surg - resolved  . Sleep apnea     associated with hypersomnia uses O2 2L/M at night and during naps also uses CPAP  . Shortness of breath dyspnea     walking distances   . Pneumonia     hx of   . Asthma   . Bronchitis     hx of   . Pain     pt states has pain in fingertips per right hand pt states has been told may be carpal tunnel  . Tremors of nervous system     hands bilat     Current outpatient prescriptions:  .  acetaminophen (TYLENOL) 650 MG CR tablet, Take 1,300 mg by mouth every 8 (eight) hours as needed for pain., Disp: , Rfl:  .  apixaban (ELIQUIS) 5 MG TABS tablet, Take 1 tablet (5 mg total) by  mouth 2 (two) times daily., Disp: 180 tablet, Rfl: 3 .  atenolol (TENORMIN) 25 MG tablet, Take 1 tablet (25 mg total) by mouth every morning., Disp: 90 tablet, Rfl: 3 .  atorvastatin (LIPITOR) 20 MG tablet, Take 1 tablet (20 mg total) by mouth daily., Disp: 90 tablet, Rfl: 3 .  Biotin (BIOTIN 5000) 5 MG CAPS, Take 1 capsule by mouth at bedtime., Disp: , Rfl:  .  budesonide-formoterol (SYMBICORT) 160-4.5 MCG/ACT inhaler, Inhale 2 puffs into the lungs 2 (two) times daily., Disp: , Rfl:  .  cilostazol (PLETAL) 100 MG tablet, Take 100 mg by mouth 2 (two) times daily., Disp: , Rfl:  .  fluticasone (FLONASE) 50 MCG/ACT nasal spray, Place 2 sprays into both nostrils daily as needed for allergies or rhinitis., Disp: , Rfl:  .  letrozole (FEMARA) 2.5 MG tablet, TAKE 1 TABLET (2.5 MG TOTAL) BY MOUTH DAILY., Disp: 90 tablet, Rfl: 3 .  levalbuterol (XOPENEX HFA) 45 MCG/ACT inhaler, Inhale 2 puffs into the lungs every 6 (six) hours as needed. For shortness of  breath, Disp: , Rfl:  .  metFORMIN (GLUCOPHAGE) 500 MG tablet, TAKE 1 TABLET TWICE DAILY WITH A MEAL, Disp: 180 tablet, Rfl: 3 .  Multiple Vitamins-Minerals (MULTIVITAMIN WITH MINERALS) tablet, Take 1 tablet by mouth 2 (two) times daily. , Disp: , Rfl:  .  potassium chloride SA (K-DUR,KLOR-CON) 20 MEQ tablet, Take 1 tablet (20 mEq total) by mouth daily. (Patient taking differently: Take 20 mEq by mouth 2 (two) times daily. ), Disp: 28 tablet, Rfl: 0 .  senna-docusate (SENOKOT-S) 8.6-50 MG per tablet, Take 1 tablet by mouth 2 (two) times daily. Stop for diarrhea or loose stools, Disp: 60 tablet, Rfl: 12 .  SPIRIVA HANDIHALER 18 MCG inhalation capsule, INHALE THE CONTENTS OF 1 CAPSULE EVERY DAY, Disp: 90 capsule, Rfl: 3 .  torsemide (DEMADEX) 10 MG tablet, Take 1 tablet (10 mg total) by mouth daily., Disp: 90 tablet, Rfl: 3 .  traMADol (ULTRAM) 50 MG tablet, Take 1-2 tablets (50-100 mg total) by mouth every 6 (six) hours as needed (mild pain)., Disp: 60 tablet, Rfl: 1   Review of Systems  Denies any dyspnea on exertion, cough, sputum production, wheezing. Denies any chest pain, palpitations. Denies any nausea, vomiting, diarrhea, constipation. No malaise, weight loss, fatigue. All other review of systems are negative.  CXR (10/24/14) No active cardiopulmonary disease.  PFTs (02/20/10) FVC 1.66 L/S (56%) FEV1 1.289 L/S (59%) F/F 74     Objective:   Physical Exam  Blood pressure 118/62, pulse 69, temperature 98.2 F (36.8 C), temperature source Oral, height 5' 5.5" (1.664 m), weight 169 lb 6.4 oz (76.839 kg), SpO2 99 %.  Gen.: Pleasant elderly female. No apparent distress Neuro: No gross focal deficits. Neck: No JVD, lymphadenopathy, thyromegaly. RS: Clear antr. No wheeze or crackles.  CVS: S1-S2 heard, no murmurs rubs gallops. Abdomen: Soft, positive bowel sounds. Extremities: No edema    Assessment & Plan:   COPD. She is doing well on her current inhalers. We'll continue the  same. I do not have any recent PFTs, spirometry to take a look at. She will get a set of PFTs at the time of her next visit.  There is a question of history of amiodarone pulmonary toxicity. I will reassess this after getting her PFTs. Her lung imaging is clear.  Heavy ex smoker. She quit 3 years ago. She misses her cigarettes but is able to keep of them. She will qualify for a screening  CT program of the chest. I will address this with her at her next visit.  OSA She is compliant with his CPAP with no issues.  Return to clinic in 6 months  Marshell Garfinkel MD [facility] Pager [phone] If no answer or after 3pm call: [phone] 12/24/2014, 2:14 PM

## 2014-12-24 NOTE — Addendum Note (Signed)
Addended by: Maryanna Shape A on: 12/24/2014 02:45 PM   Modules accepted: Orders

## 2014-12-24 NOTE — Patient Instructions (Signed)
Continue your current inhalers We will schedule you for a lung function test We will give you a flu vaccine today.  Return to clinic in 6 months.

## 2014-12-28 ENCOUNTER — Other Ambulatory Visit: Payer: Self-pay | Admitting: Internal Medicine

## 2015-01-02 ENCOUNTER — Ambulatory Visit (INDEPENDENT_AMBULATORY_CARE_PROVIDER_SITE_OTHER): Payer: Medicare Other | Admitting: *Deleted

## 2015-01-02 DIAGNOSIS — I481 Persistent atrial fibrillation: Secondary | ICD-10-CM

## 2015-01-02 DIAGNOSIS — Z950 Presence of cardiac pacemaker: Secondary | ICD-10-CM

## 2015-01-02 DIAGNOSIS — I4819 Other persistent atrial fibrillation: Secondary | ICD-10-CM

## 2015-01-02 LAB — CUP PACEART INCLINIC DEVICE CHECK
Implantable Lead Implant Date: 20051116
Implantable Lead Location: 753859
Implantable Lead Location: 753860
Lead Channel Impedance Value: 474 Ohm
Lead Channel Pacing Threshold Pulse Width: 0.5 ms
Lead Channel Sensing Intrinsic Amplitude: 3.4 mV
MDC IDC LEAD IMPLANT DT: 20051116
MDC IDC MSMT BATTERY IMPEDANCE: 28500 Ohm
MDC IDC MSMT BATTERY VOLTAGE: 2.58 V
MDC IDC MSMT LEADCHNL RA IMPEDANCE VALUE: 464 Ohm
MDC IDC MSMT LEADCHNL RV PACING THRESHOLD AMPLITUDE: 0.75 V
MDC IDC SESS DTM: 20161026113118
MDC IDC SET LEADCHNL RA PACING AMPLITUDE: 2 V
MDC IDC SET LEADCHNL RV PACING PULSEWIDTH: 0.5 ms
MDC IDC SET LEADCHNL RV SENSING SENSITIVITY: 4 mV
MDC IDC STAT BRADY RA PERCENT PACED: 46 %
MDC IDC STAT BRADY RV PERCENT PACED: 99 % — AB
Pulse Gen Serial Number: 1409809

## 2015-01-02 NOTE — Progress Notes (Signed)
Pacemaker check in clinic. Normal device function. Threshold, sensing, impedances consistent with previous measurements. Device programmed to maximize longevity. >99% mode switched- taking eliquis. No high ventricular rates noted. Device programmed at appropriate safety margins. Histogram distribution appropriate for patient activity level. Device programmed to optimize intrinsic conduction. Estimated longevity 0-0.25 year. Return to device clinic 01/28/15, [place] with SK in July.

## 2015-01-03 NOTE — Progress Notes (Signed)
Patient ID: [name], female   DOB: [birth year], 81 y.o.   MRN: [id number]    [facility]  Chief Complaint  Patient presents with  . New Admit To SNF    New Admission    Allergies  Allergen Reactions  . Cholestatin     RAGWEED SEASON...sneezing   . Sulfur Itching   HPI 81 y/o female patient is here for STR post hospital admission with left hip OA. she underwent left hip arthroplasty with posterior approach on 10/31/14. she is seen in his room today. she has cognitive impairment and poor insight into her medical issues. Denies any pain this visit.   Review of Systems  Constitutional: Negative for fever, chills HENT: Negative for congestion Eyes: Negative for blurred vision, double vision and discharge.  Respiratory: Negative for cough, sputum production, shortness of breath and wheezing.   Cardiovascular: Negative for chest pain, palpitations  Gastrointestinal: Negative for heartburn, nausea, vomiting, abdominal pain, had bowel movement yesterday Genitourinary: Negative for dysuria Musculoskeletal: Negative for back pain Skin: Negative for itching and rash.  Neurological: Positive for weakness. Negative for dizziness  Psychiatric/Behavioral: Negative for depression  Past Medical History  Diagnosis Date  . 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 Chronic renal insufficiency, stage III (moderate)     CrCl about 60 ml/min  . Diabetes ([place])   . Tobacco abuse   . Diastolic heart failure     Acute on Chronic  . Pacemaker     Permanent  . AF (atrial fibrillation) ([place])      AV ablation 9/09 [name] per Dr Ola Spurr - AV node ablation 9/11 Dr Caryl Comes  . Hyperlipidemia   . GERD (gastroesophageal reflux disease)   . [name] (coronary artery disease)     (not sure of this 11/10  . COPD (chronic obstructive pulmonary disease) (HCC)     emphysema -FeV1 73% DLCO 53% 5/09  . Invasive ductal carcinoma of breast ([place]) 2011    LEFT   . OA  (osteoarthritis) of knee     RIGHT  . Right sided sciatica   . Sinoatrial node dysfunction (HCC)   . CHF (congestive heart failure) ([place])   . Mental disorder   . Acute blood loss anemia 04/20/2012  . Depression 06/06/2012  . Hx of cardiovascular stress test     Lexiscan Myoview (09/2013):  No definite ischemia, EF 67%; low risk  . PAD (peripheral artery disease) (HCC)     w/hx right iliac/SFA stenting and left and rt leg PTA  . CVA (cerebral vascular accident) ([place])     no residual effects evident to pt   . History of skin cancer   . Eczema   . Complication of anesthesia     "psycotic episode" after hip surg - resolved  . Sleep apnea     associated with hypersomnia uses O2 2L/M at night and during naps also uses CPAP  . Shortness of breath dyspnea     walking distances   . Pneumonia     hx of   . Asthma   . Bronchitis     hx of   . Pain     pt states has pain in fingertips per right hand pt states has been told may be carpal tunnel  . Tremors of nervous system     hands bilat    Past Surgical History  Procedure Laterality Date  . Mastectomy, partial  02/03/2010    Left/Dr Rosenbower  . Cataract  extraction, bilateral      with IOL/Dr Groat  . Breast lumpectomy      left breast  . Orif acetabular fracture Left 04/19/2012    Procedure: OPEN REDUCTION INTERNAL FIXATION (ORIF) ACETABULAR FRACTURE;  Surgeon: Rozanna Box, MD;  Location: [facility];  Service: Orthopedics;  Laterality: Left;  . Atrial ablation surgery      x 2 - "did not help - had to have pacemaker"  . Vascular surgery      both legs   . Cardiac catheterization    . Total knee arthroplasty Right 10/16/2013    Procedure: RIGHT TOTAL KNEE ARTHROPLASTY;  Surgeon: Gearlean Alf, MD;  Location: [facility];  Service: Orthopedics;  Laterality: Right;  . Sun spot removed       forhead  . Total hip arthroplasty Left 10/31/2014    Procedure: LEFT TOTAL HIP ARTHROPLASTY POSTERIOR  APPROACH;  Surgeon: Gaynelle Arabian, MD;   Location: [facility];  Service: Orthopedics;  Laterality: Left;   Family History  Problem Relation Age of Onset  . Heart disease Father    Social History   Social History  . Marital Status: Married    Spouse Name: N/A  . Number of Children: N/A  . Years of Education: N/A   Occupational History  . Not on file.   Social History Main Topics  . Smoking status: Former Smoker -- 1.00 packs/day for 55 years    Types: Cigarettes    Quit date: 12/19/2011  . Smokeless tobacco: Never Used     Comment: started smoking at age 4--4 cigs per day  . Alcohol Use: Yes     Comment: wine occasionally  . Drug Use: No  . Sexual Activity: No   Other Topics Concern  . Not on file   Social History Narrative   HS Graduate; [employer].  Married '61.  2 sons - '70, '71; Dtrs - '64,'68;    6 grandchildren.  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 Interests -Tai-chi & water aerobics, gardening, active lifestyle.  Marriage a bit stressful - SO w/membory problems and difficult behavior. She denies any personal safety concerns. End of life care: need to address at next OV               Medication List       This list is accurate as of: 11/08/14 11:59 PM.  Always use your most recent med list.               acetaminophen 650 MG CR tablet  Commonly known as:  TYLENOL  Take 1,300 mg by mouth every 8 (eight) hours as needed for pain.     apixaban 5 MG Tabs tablet  Commonly known as:  ELIQUIS  Take 1 tablet (5 mg total) by mouth 2 (two) times daily.     atenolol 25 MG tablet  Commonly known as:  TENORMIN  Take 1 tablet (25 mg total) by mouth every morning.     atorvastatin 20 MG tablet  Commonly known as:  LIPITOR  TAKE 1 TABLET EVERY DAY     BIOTIN 5000 5 MG Caps  Generic drug:  Biotin  Take 1 capsule by mouth at bedtime.      bisacodyl 10 MG suppository  Commonly known as:  DULCOLAX  Place 1 suppository (10 mg total) rectally daily as needed  for moderate constipation.     budesonide-formoterol 160-4.5 MCG/ACT inhaler  Commonly known as:  SYMBICORT  Inhale 2 puffs into the lungs 2 (two) times daily.     cilostazol 100 MG tablet  Commonly known as:  PLETAL  Take 100 mg by mouth 2 (two) times daily.     docusate sodium 100 MG capsule  Commonly known as:  COLACE  Take 1 capsule (100 mg total) by mouth 2 (two) times daily as needed for mild constipation.     fluticasone 50 MCG/ACT nasal spray  Commonly known as:  FLONASE  Place 2 sprays into both nostrils daily as needed for allergies or rhinitis.     letrozole 2.5 MG tablet  Commonly known as:  FEMARA  TAKE 1 TABLET (2.5 MG TOTAL) BY MOUTH DAILY.     levalbuterol 45 MCG/ACT inhaler  Commonly known as:  XOPENEX HFA  Inhale 2 puffs into the lungs every 6 (six) hours as needed. For shortness of breath     metFORMIN 500 MG tablet  Commonly known as:  GLUCOPHAGE  TAKE 1 TABLET TWICE DAILY WITH A MEAL     methocarbamol 500 MG tablet  Commonly known as:  ROBAXIN  Take 1 tablet (500 mg total) by mouth every 6 (six) hours as needed for muscle spasms.     metoCLOPramide 5 MG tablet  Commonly known as:  REGLAN  Take 1 tablet (5 mg total) by mouth every 8 (eight) hours as needed for nausea (if ondansetron (ZOFRAN) ineffective.).     multivitamin with minerals tablet  Take 1 tablet by mouth 2 (two) times daily.     ondansetron 4 MG tablet  Commonly known as:  ZOFRAN  Take 1 tablet (4 mg total) by mouth every 6 (six) hours as needed for nausea.     oxyCODONE 5 MG immediate release tablet  Commonly known as:  Oxy IR/ROXICODONE  Take 1-2 tablets (5-10 mg total) by mouth every 3 (three) hours as needed for moderate pain, severe pain or breakthrough pain.     polyethylene glycol packet  Commonly known as:  MIRALAX / GLYCOLAX  Take 17 g by mouth daily as  needed for mild constipation.     potassium chloride SA 20 MEQ tablet  Commonly known as:  K-DUR,KLOR-CON  Take 1 tablet (20 mEq total) by mouth daily.     senna-docusate 8.6-50 MG tablet  Commonly known as:  Senokot-S  Take 1 tablet by mouth 2 (two) times daily. Stop for diarrhea or loose stools     sodium phosphate 7-19 GM/118ML Enem  Place 133 mLs (1 enema total) rectally once as needed for severe constipation.     SPIRIVA HANDIHALER 18 MCG inhalation capsule  Generic drug:  tiotropium  INHALE THE CONTENTS OF 1 CAPSULE EVERY DAY     torsemide 10 MG tablet  Commonly known as:  DEMADEX  Take 1 tablet (10 mg total) by mouth daily.     traMADol 50 MG tablet  Commonly known as:  ULTRAM  Take 1-2 tablets (50-100 mg total) by mouth every 6 (six) hours as needed (mild pain).        Physical exam BP 101/60 mmHg  Pulse 79  Temp(Src) 98.9 F (37.2 C) (Oral)  Resp 16  Ht _0  (1.651 m)  Wt 188 lb 6.4 oz (85.458 kg)  BMI 31.35 kg/m2  SpO2 94%  General- elderly female in no acute distress Head- atraumatic, normocephalic Eyes- PERRLA, EOMI, no pallor, no icterus Neck-  no lymphadenopathy Cardiovascular- normal s1,s2, no murmurs Respiratory- bilateral clear to auscultation, no wheeze, no rhonchi, no crackles Abdomen- bowel sounds present, soft, non tender Musculoskeletal- able to move all 4 extremities, limited ROM to left leg Neurological- no focal deficit Skin- left hip steristrips in place Psychiatry- alert and oriented to person, place and time, poor insight noted to her medical issues  Labs reviewed   Assessment/plan  Left hip Osteoarthritis  S/P left total hip arthroplasty. Has follow up with orthopedics. Will have her work with physical therapy and occupational therapy team to help with gait training and muscle strengthening exercises.fall precautions. Skin care. Encourage to be out of bed. Continue tramadol 50 mg 1-2 tab q6h prn pain with oxycodone 5 mg 1-2 tab q3h  prn pain. Continue robaxin 500 mg q6h prn muscle spasm. Continue eliquis 5 mg bid for dvt prophylaxis.  Blood loss anemia Post op, monitor h&h  Constipation Stable, continue senna s bid with prn colace, miralax and dulcolax suppository  Dm type 2 Continue metformin 500 mg bid, monitor cbg  Hyperlipidemia  continue atorvastatin 20 mg daily  Chronic diastolic failure Continue demadex 10 mg daily, monitor bmp and continue kcl supplement  OSA Continue home CPAP at night  COPD continue Advair and spiriva, stable  Left breast cancer continue Femara 2.5 mg daily   Goals of care: short term rehabilitation  [place], MD  [facility] [phone] (Monday-Friday 8 am - 5 pm) [phone] (afterhours)

## 2015-01-04 ENCOUNTER — Ambulatory Visit
Admission: RE | Admit: 2015-01-04 | Discharge: 2015-01-04 | Disposition: A | Discharge status: Home / Self Care | Payer: Medicare Other | Source: Ambulatory Visit | Attending: Oncology | Admitting: Oncology

## 2015-01-04 DIAGNOSIS — Z853 Personal history of malignant neoplasm of breast: Secondary | ICD-10-CM

## 2015-01-04 DIAGNOSIS — Z9889 Other specified postprocedural states: Secondary | ICD-10-CM

## 2015-01-04 DIAGNOSIS — R928 Other abnormal and inconclusive findings on diagnostic imaging of breast: Secondary | ICD-10-CM | POA: Diagnosis not present

## 2015-01-09 ENCOUNTER — Other Ambulatory Visit: Payer: Self-pay | Admitting: *Deleted

## 2015-01-09 NOTE — Consult Note (Signed)
   [facility] CM Inpatient Consult   01/09/2015  IMAAN PADGETT 1931-05-06 [id number]   Referral received from family member for [place] Management services. Called Mrs. Kau to discuss and explain [facility] Care Management. Mrs. Bateson states she prefers to have home visit planned and that she does not want to discuss medications and the like over the phone. Verbal consent obtained for [facility] Care Management and will request for patient to be assigned to [facility] for follow up. Will request for [facility] RNCM assessment to be made for whether patient needs social worker referral and or pharmacy referral upon contact with Mrs Noguchi. Confirmed best contact number as [phone].  Marthenia Rolling, MSN-Ed, RN,BSN [facility] Liaison [phone]

## 2015-01-10 NOTE — Patient Outreach (Signed)
[facility]) Care Management  01/10/2015  [name] 1931-06-01 [id number]   Referral from Marthenia Rolling, RN to assign Community RN, assigned Erenest Rasher, RN.  Thanks, Ronnell Freshwater. [place], [place] Assistant Phone: [phone] Fax: [phone]

## 2015-01-11 ENCOUNTER — Other Ambulatory Visit: Payer: Self-pay

## 2015-01-11 DIAGNOSIS — Z96642 Presence of left artificial hip joint: Secondary | ICD-10-CM | POA: Diagnosis not present

## 2015-01-11 DIAGNOSIS — Z471 Aftercare following joint replacement surgery: Secondary | ICD-10-CM | POA: Diagnosis not present

## 2015-01-11 DIAGNOSIS — I10 Essential (primary) hypertension: Secondary | ICD-10-CM

## 2015-01-11 NOTE — Patient Outreach (Signed)
Unsuccessful in speaking to patient with the initial telephone call: call made to number listed above, female sounding voice answered stated patient was still sleeping.  This RNCM advised I will call back this afternoon.    Second attempt successful in making contact with patient.  Patient agreed to home visit on November 10 for community care coordination assessment as patient states she would rather talk in person about her needs.

## 2015-01-14 ENCOUNTER — Other Ambulatory Visit (HOSPITAL_BASED_OUTPATIENT_CLINIC_OR_DEPARTMENT_OTHER): Payer: Medicare Other

## 2015-01-14 ENCOUNTER — Ambulatory Visit (HOSPITAL_BASED_OUTPATIENT_CLINIC_OR_DEPARTMENT_OTHER): Payer: Medicare Other

## 2015-01-14 ENCOUNTER — Telehealth: Payer: Self-pay | Admitting: Oncology

## 2015-01-14 ENCOUNTER — Ambulatory Visit (HOSPITAL_BASED_OUTPATIENT_CLINIC_OR_DEPARTMENT_OTHER): Payer: Medicare Other | Admitting: Oncology

## 2015-01-14 VITALS — BP 113/49 | HR 70 | Temp 97.9°F | Resp 18 | Ht 65.5 in | Wt 164.9 lb

## 2015-01-14 DIAGNOSIS — M810 Age-related osteoporosis without current pathological fracture: Secondary | ICD-10-CM

## 2015-01-14 DIAGNOSIS — C50511 Malignant neoplasm of lower-outer quadrant of right female breast: Secondary | ICD-10-CM | POA: Diagnosis not present

## 2015-01-14 DIAGNOSIS — C50912 Malignant neoplasm of unspecified site of left female breast: Secondary | ICD-10-CM

## 2015-01-14 LAB — BASIC METABOLIC PANEL (CC13)
Anion Gap: 11 mEq/L (ref 3–11)
BUN: 17.5 mg/dL (ref 7.0–26.0)
CHLORIDE: 104 meq/L (ref 98–109)
CO2: 23 mEq/L (ref 22–29)
CREATININE: 1 mg/dL (ref 0.6–1.1)
Calcium: 10.7 mg/dL — ABNORMAL HIGH (ref 8.4–10.4)
EGFR: 55 mL/min/{1.73_m2} — ABNORMAL LOW (ref >90)
Glucose: 134 mg/dl (ref 70–140)
Potassium: 4.8 mEq/L (ref 3.5–5.1)
Sodium: 138 mEq/L (ref 136–145)

## 2015-01-14 MED ORDER — SODIUM CHLORIDE 0.9 % IV SOLN
Freq: Once | INTRAVENOUS | Status: AC
  Administered 2015-01-14: 15:00:00 via INTRAVENOUS

## 2015-01-14 MED ORDER — ZOLEDRONIC ACID 4 MG/100ML IV SOLN
4.0000 mg | Freq: Once | INTRAVENOUS | Status: AC
  Administered 2015-01-14: 4 mg via INTRAVENOUS
  Filled 2015-01-14: qty 100

## 2015-01-14 NOTE — Patient Instructions (Signed)

## 2015-01-14 NOTE — Patient Outreach (Signed)
[facility] Care Management  01/14/2015  [name] 1931/05/03 [id number]   Request from Erenest Rasher, RN to assign pharmacy, assigned Deanne Coffer, PharmD for patient outreach.  Kateleen Encarnacion L. Maxx Pham, [place] Care Management Assistant

## 2015-01-14 NOTE — Telephone Encounter (Signed)
Gave patient avs report and appointments for May and November 2017.

## 2015-01-14 NOTE — Patient Outreach (Signed)
[place] [facility]) Care Management  01/14/2015  SWEETIE GIEBLER 1931/03/23 [id number]   Request from Erenest Rasher, RN to assign SW, assigned [name], LCSW for patient outreach.  [place] L. Cecily [place]horne, [place] Care Management Assistant

## 2015-01-14 NOTE — Progress Notes (Signed)
ID: [name]   DOB: 10/27/1931  MR#: [id number]  CSN#:[id number]  PCP: Elizabeth A Crawford, MD GYN: SU:  OTHER MD: Murali Ramaswami MD  CHIEF COMPLAINT: left breast cancer   CURRENT TREATMENT: Completed 5 years of letrozole as of November 2016  BREAST CANCER HISTORY: From the original intake note:  [name] had screening mammography September 29th 2011 which showed possible masses in both breasts (the prior mammogram that I have is from October of 2008).  She was recalled for further views on October 11th.  In the right breast, there was an oval circumscribed nodule in the anterior lower outer quadrant which by ultrasound was felt possibly to represent either an intraductal lesion or debris in a dilated cyst.  In the left breast, Dr. Eagle found a small spiculated mass at 2 o'clock in the subareolar region which by ultrasound was hypoechoic and measured 1 cm.  The left axilla showed normal axillary lymph node anatomy.  With this information, after appropriate discussion on the same day, Dr. Eagle proceeded to aspiration of the right breast cyst which histologically proved to be benign adipose tissue.  Biopsy of the left breast, however ([id number]) showed an invasive ductal carcinoma which was 100% ER positive, 0% PR positive with a low proliferation marker at 7% and no Her-2 amplification with a ratio by CISH of 1.21.    With this information, the patient was referred to Dr. Rosenbower.  Bilateral breast MRIs could not be obtained because the patient has a pacemaker in place.  So she proceeded to needle localization lumpectomy of the left breast mass February 03, 2010.  The pathology showed ([id number]) a 1.2 cm invasive ductal carcinoma, grade 2, with negative margins and only focal lymphovascular invasion.  Her-2 was repeated on this tissue and was again negative.  Her subsequent history is as detailed below.  INTERVAL HISTORY: [name] returns today for follow up of her  estrogen receptor positive breast cancer. The interval history is unremarkable. She is doing "fine". However she did have right knee replacement and left hip replacement under Dr. Lucio. She is very pleased with the results although she tells me she had to wait 3 hours to see him yesterday and she was not pleased with that portion of it. From a breast cancer point of view, she continues on letrozole, with good tolerance. Hot flashes, vaginal dryness, and arthralgias/myalgias not major issues for her.   REVIEW OF SYSTEMS: [name] lives by herself and is "board to death". She has a helper who drives her around but also who argues with her. She tells me all her doctors have retired and she does not know her new doctors very well. Aside from these issues a detailed review of systems today was stable  PAST MEDICAL HISTORY: Past Medical History  Diagnosis Date  . Amiodarone pulmonary toxicity (HCC)   . Chronic renal insufficiency, stage III (moderate)     CrCl about 60 ml/min  . Diabetes (HCC)   . Tobacco abuse   . Diastolic heart failure     Acute on Chronic  . Pacemaker     Permanent  . AF (atrial fibrillation) (HCC)      AV ablation 9/09 [facility] per Dr Fitzgerald - AV node ablation 9/11 Dr Klein  . Hyperlipidemia   . GERD (gastroesophageal reflux disease)   . [name] (coronary artery disease)     (not sure of this 11/10  . COPD (chronic obstructive pulmonary disease) (HCC)     emphysema -FeV1   73% DLCO 53% 5/09  . Invasive ductal carcinoma of breast (HCC) 2011    LEFT   . OA (osteoarthritis) of knee     RIGHT  . Right sided sciatica   . Sinoatrial node dysfunction (HCC)   . CHF (congestive heart failure) (HCC)   . Mental disorder   . Acute blood loss anemia 04/20/2012  . Depression 06/06/2012  . Hx of cardiovascular stress test     Lexiscan Myoview (09/2013):  No definite ischemia, EF 67%; low risk  . PAD (peripheral artery disease) (HCC)     w/hx right iliac/SFA stenting and left and rt  leg PTA  . CVA (cerebral vascular accident) (HCC)     no residual effects evident to pt   . History of skin cancer   . Eczema   . Complication of anesthesia     "psycotic episode" after hip surg - resolved  . Sleep apnea     associated with hypersomnia uses O2 2L/M at night and during naps also uses CPAP  . Shortness of breath dyspnea     walking distances   . Pneumonia     hx of   . Asthma   . Bronchitis     hx of   . Pain     pt states has pain in fingertips per right hand pt states has been told may be carpal tunnel  . Tremors of nervous system     hands bilat     PAST SURGICAL HISTORY: Past Surgical History  Procedure Laterality Date  . Mastectomy, partial  02/03/2010    Left/Dr Rosenbower  . Cataract extraction, bilateral      with IOL/Dr Groat  . Breast lumpectomy      left breast  . Orif acetabular fracture Left 04/19/2012    Procedure: OPEN REDUCTION INTERNAL FIXATION (ORIF) ACETABULAR FRACTURE;  Surgeon: Michael H Handy, MD;  Location: [facility] OR;  Service: Orthopedics;  Laterality: Left;  . Atrial ablation surgery      x 2 - "did not help - had to have pacemaker"  . Vascular surgery      both legs   . Cardiac catheterization    . Total knee arthroplasty Right 10/16/2013    Procedure: RIGHT TOTAL KNEE ARTHROPLASTY;  Surgeon: Frank Aluisio V, MD;  Location: [facility] ORS;  Service: Orthopedics;  Laterality: Right;  . Sun spot removed       forhead  . Total hip arthroplasty Left 10/31/2014    Procedure: LEFT TOTAL HIP ARTHROPLASTY POSTERIOR  APPROACH;  Surgeon: Frank Aluisio, MD;  Location: [facility] ORS;  Service: Orthopedics;  Laterality: Left;    FAMILY HISTORY Family History  Problem Relation Age of Onset  . Heart disease Father   The patient's father died from myocardial infarction at the age of 65.  The patient's mother died from unclear causes in the setting of COPD and ETOH abuse at the age of 83.  The patient had two sisters.  There is no history of breast or ovarian  cancer in the family.   GYNECOLOGIC HISTORY: She is GX P4.  First pregnancy to term she believes was age 32.  The patient underwent the change of life in her late thirties.  She never took hormone therapy. She does not recall her age at menarche.    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  Her husband, [name], was in industrial supplies.  Her son, [name], is   completing a PhD in Psychology . Daughter Enid Derry teaches kindergarten in the [place] area.  Daughter Zigmund Daniel is currently working for a Brewing technologist at [employer].  Son Clair Gulling lives in [place] and works at [employer]. The patient has 6 grandchildren, no great grandchildren.  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     ADVANCED DIRECTIVES: in place  HEALTH MAINTENANCE: Social History  Substance Use Topics  . Smoking status: Former Smoker -- 1.00 packs/day for 55 years    Types: Cigarettes    Quit date: 12/19/2011  . Smokeless tobacco: Never Used     Comment: started smoking at age 65--4 cigs per day  . Alcohol Use: Yes     Comment: wine occasionally     Colonoscopy: due  PAP:  Bone density: T - 1.17 Mar 2010  Lipid panel:  Allergies  Allergen Reactions  . Cholestatin     RAGWEED SEASON...sneezing   . Sulfur Itching    Current Outpatient Prescriptions  Medication Sig Dispense Refill  . acetaminophen (TYLENOL) 650 MG CR tablet Take 1,300 mg by mouth every 8 (eight) hours as needed for pain.    Marland Kitchen atenolol (TENORMIN) 25 MG tablet Take 1 tablet (25 mg total) by mouth every morning. 90 tablet 3  . atorvastatin (LIPITOR) 20 MG tablet Take 1 tablet (20 mg total) by mouth daily. 90 tablet 3  . Biotin (BIOTIN 5000) 5 MG CAPS Take 1 capsule by mouth at bedtime.    . budesonide-formoterol (SYMBICORT) 160-4.5 MCG/ACT inhaler Inhale 2 puffs into the lungs 2 (two) times daily.    . cilostazol (PLETAL) 100 MG tablet Take 100 mg by mouth 2 (two)  times daily.    Marland Kitchen ELIQUIS 5 MG TABS tablet TAKE 1 TABLET TWICE DAILY  (MUST  ESTABLISH  NEW  PRIMARY CARE PROVIDER  FOR FUTURE REFILLS) 180 tablet 3  . fluticasone (FLONASE) 50 MCG/ACT nasal spray Place 2 sprays into both nostrils daily as needed for allergies or rhinitis.    Marland Kitchen letrozole (FEMARA) 2.5 MG tablet TAKE 1 TABLET (2.5 MG TOTAL) BY MOUTH DAILY. 90 tablet 3  . levalbuterol (XOPENEX HFA) 45 MCG/ACT inhaler Inhale 2 puffs into the lungs every 6 (six) hours as needed. For shortness of breath    . metFORMIN (GLUCOPHAGE) 500 MG tablet TAKE 1 TABLET TWICE DAILY WITH A MEAL 180 tablet 3  . Multiple Vitamins-Minerals (MULTIVITAMIN WITH MINERALS) tablet Take 1 tablet by mouth 2 (two) times daily.     . potassium chloride SA (K-DUR,KLOR-CON) 20 MEQ tablet Take 1 tablet (20 mEq total) by mouth daily. (Patient taking differently: Take 20 mEq by mouth 2 (two) times daily. ) 28 tablet 0  . senna-docusate (SENOKOT-S) 8.6-50 MG per tablet Take 1 tablet by mouth 2 (two) times daily. Stop for diarrhea or loose stools 60 tablet 12  . SPIRIVA HANDIHALER 18 MCG inhalation capsule INHALE THE CONTENTS OF 1 CAPSULE EVERY DAY 90 capsule 3  . torsemide (DEMADEX) 10 MG tablet TAKE 1 TABLET EVERY DAY 90 tablet 1  . traMADol (ULTRAM) 50 MG tablet Take 1-2 tablets (50-100 mg total) by mouth every 6 (six) hours as needed (mild pain). 60 tablet 1   No current facility-administered medications for this visit.    OBJECTIVE: Elderly white woman who appears stated age 41 Vitals:   01/14/15 1322  BP: 113/49  Pulse: 70  Temp: 97.9 F (36.6 C)  Resp: 18     Body mass index is  27.01 kg/(m^2).    ECOG FS: 2  Sclerae unicteric, EOMs intact Oropharynx clear, dentition in good repair No cervical or supraclavicular adenopathy Lungs no rales or rhonchi Heart regular rate and rhythm Abd soft, nontender, positive bowel sounds MSK no focal spinal tenderness, no upper extremity lymphedema Neuro: nonfocal, well oriented,  appropriate affect Breasts: The right breast is unremarkable. The left breast is status post lumpectomy. There is no evidence of local recurrence. The left axilla is benign.   LAB RESULTS: Lab Results  Component Value Date   WBC 8.9 11/16/2014   NEUTROABS 5.9 01/10/2014   HGB 10.0* 11/16/2014   HCT 31.1* 11/16/2014   MCV 80.2 11/16/2014   PLT 442.0* 11/16/2014      Chemistry      Component Value Date/Time   NA 137 11/02/2014 0532   NA 139 01/10/2014 1105   K 4.6 11/02/2014 0532   K 4.3 01/10/2014 1105   CL 107 11/02/2014 0532   CO2 22 11/02/2014 0532   CO2 24 01/10/2014 1105   BUN 15 11/02/2014 0532   BUN 10.6 01/10/2014 1105   BUN 18 07/14/2010 0922   CREATININE 0.77 11/02/2014 0532   CREATININE 0.8 01/10/2014 1105   CREATININE 1.0 07/14/2010 0922      Component Value Date/Time   CALCIUM 8.4* 11/02/2014 0532   CALCIUM 9.9 01/10/2014 1105   CALCIUM 7.7* 04/21/2012 0330   ALKPHOS 72 10/24/2014 1500   ALKPHOS 82 01/10/2014 1105   AST 18 10/24/2014 1500   AST 13 01/10/2014 1105   ALT 14 10/24/2014 1500   ALT 7 01/10/2014 1105   BILITOT 0.4 10/24/2014 1500   BILITOT 0.41 01/10/2014 1105       Lab Results  Component Value Date   LABCA2 22 07/14/2011    No components found for: LABCA125  No results for input(s): INR in the last 168 hours.  Urinalysis    Component Value Date/Time   COLORURINE YELLOW 10/24/2014 1433   APPEARANCEUR CLEAR 10/24/2014 1433   LABSPEC 1.007 10/24/2014 1433   PHURINE 5.0 10/24/2014 1433   GLUCOSEU NEGATIVE 10/24/2014 1433   HGBUR NEGATIVE 10/24/2014 1433   BILIRUBINUR NEGATIVE 10/24/2014 1433   KETONESUR NEGATIVE 10/24/2014 1433   PROTEINUR NEGATIVE 10/24/2014 1433   UROBILINOGEN 0.2 10/24/2014 1433   NITRITE NEGATIVE 10/24/2014 1433   LEUKOCYTESUR NEGATIVE 10/24/2014 1433    STUDIES: Mm Diag Breast Tomo Bilateral  01/04/2015  CLINICAL DATA:  History of left lumpectomy 2011. EXAM: DIGITAL DIAGNOSTIC BILATERAL MAMMOGRAM  WITH 3D TOMOSYNTHESIS AND [name] COMPARISON:  12/29/2013 and earlier ACR Breast Density Category a: The breast tissue is almost entirely fatty. FINDINGS: Postoperative changes are seen in the left breast. No suspicious mass, distortion, or microcalcifications are identified to suggest presence of malignancy. Mammographic images were processed with [name]. IMPRESSION: No mammographic evidence for malignancy. RECOMMENDATION: Diagnostic mammogram is suggested in 1 year. (Code:[id number]-B-01Y) I have discussed the findings and recommendations with the patient. Results were also provided in writing at the conclusion of the visit. If applicable, a reminder letter will be sent to the patient regarding the next appointment. BI-RADS CATEGORY  2: Benign. Electronically Signed   By: Elizabeth  Brown M.D.   On: 01/04/2015 15:19   Most recent bone density scan on 12/29/13 showed a t-score of -2.8 at femoral neck, last measured at -1.9 on 04/05/10  ASSESSMENT: 81 y.o.  [place] woman   (1) status post left lumpectomy November 2011 for a pT1c cN0, stage I invasive ductal carcinoma, grade 2,   strongly estrogen receptor positive, with a low proliferation fraction at 7%, progesterone receptor and HER-2 negative, on letrozole since December 2011 with excellent tolerance  (2) Osteoporosis - zometa started November 2015, repeated November 2016  PLAN:  Tracee has completed 5 years of letrozole and is now ready to stop that medication. I think the benefit she might derive from an additional 5 years of treatment would be minimal and I do not recommended it in her case.  I would be comfortable with her "graduating" but she has just lost all her other doctors, who have retired. She is just getting to know her new or doctors. She would like to continue to see us and in fact she would like to see us twice a year rather than yearly.  We can facilitate this through our survivorship program and she will see our survivorship nurse practitioner  in February. She will then return to see us next October.  She will receive zolendronate today. She has tolerated that well and hopefully that will have helped her osteoporosis. She will need a new bone density October 2017. Depending on those results we can decide whether she will need zolendronate again a year from now.  She has a good understanding of the overall plan. She agrees with it. She will call with any problems that may develop before her next visit here.   , C    01/14/2015   

## 2015-01-16 ENCOUNTER — Other Ambulatory Visit: Payer: Self-pay | Admitting: Licensed Clinical Social Worker

## 2015-01-16 NOTE — Patient Outreach (Signed)
[place] [facility]) Care Management  01/16/2015  [name] 11/09/1931 [id number]   Assessment-CSW received referral from [place] to assist patient with social work needs. CSW completed initial outreach to patient's residence. Someone answered and stated patient was currently taking a nap.  Plan-CSW will make second outreach at a different time.   [name], BSW, MSW, [email or web address] Phone: [phone] Fax: [phone]

## 2015-01-17 ENCOUNTER — Other Ambulatory Visit: Payer: Self-pay

## 2015-01-17 DIAGNOSIS — M25562 Pain in left knee: Principal | ICD-10-CM

## 2015-01-17 DIAGNOSIS — M25561 Pain in right knee: Secondary | ICD-10-CM

## 2015-01-18 ENCOUNTER — Other Ambulatory Visit: Payer: Self-pay | Admitting: Pharmacist

## 2015-01-18 NOTE — Patient Outreach (Signed)
[facility]) Care Management  [place]   01/18/2015  [name] 1931-06-19 [id number]  Subjective: [name] is a 81yo referred to [place] for medication review.    Objective:   Current Medications: Current Outpatient Prescriptions  Medication Sig Dispense Refill  . acetaminophen (TYLENOL) 650 MG CR tablet Take 1,300 mg by mouth every 8 (eight) hours as needed for pain.    Marland Kitchen atenolol (TENORMIN) 25 MG tablet Take 1 tablet (25 mg total) by mouth every morning. 90 tablet 3  . atorvastatin (LIPITOR) 20 MG tablet Take 1 tablet (20 mg total) by mouth daily. 90 tablet 3  . Biotin (BIOTIN 5000) 5 MG CAPS Take 1 capsule by mouth at bedtime.    . budesonide-formoterol (SYMBICORT) 160-4.5 MCG/ACT inhaler Inhale 2 puffs into the lungs 2 (two) times daily.    . cilostazol (PLETAL) 100 MG tablet Take 100 mg by mouth 2 (two) times daily.    Marland Kitchen ELIQUIS 5 MG TABS tablet TAKE 1 TABLET TWICE DAILY  (MUST  ESTABLISH  NEW  PRIMARY CARE PROVIDER  FOR FUTURE REFILLS) 180 tablet 3  . levalbuterol (XOPENEX HFA) 45 MCG/ACT inhaler Inhale 2 puffs into the lungs every 6 (six) hours as needed. For shortness of breath    . metFORMIN (GLUCOPHAGE) 500 MG tablet TAKE 1 TABLET TWICE DAILY WITH A MEAL 180 tablet 3  . Multiple Vitamins-Minerals (MULTIVITAMIN WITH MINERALS) tablet Take 1 tablet by mouth 2 (two) times daily.     . potassium chloride SA (K-DUR,KLOR-CON) 20 MEQ tablet Take 1 tablet (20 mEq total) by mouth daily. (Patient taking differently: Take 20 mEq by mouth 2 (two) times daily. ) 28 tablet 0  . senna-docusate (SENOKOT-S) 8.6-50 MG per tablet Take 1 tablet by mouth 2 (two) times daily. Stop for diarrhea or loose stools 60 tablet 12  . SPIRIVA HANDIHALER 18 MCG inhalation capsule INHALE THE CONTENTS OF 1 CAPSULE EVERY DAY 90 capsule 3  . torsemide (DEMADEX) 10 MG tablet TAKE 1 TABLET EVERY DAY 90 tablet 1  . traMADol (ULTRAM) 50 MG tablet Take 1-2 tablets (50-100  mg total) by mouth every 6 (six) hours as needed (mild pain). 60 tablet 1   No current facility-administered medications for this visit.    Functional Status: In your present state of health, do you have any difficulty performing the following activities: 01/17/2015 10/31/2014  Hearing? Y N  Vision? Y N  Difficulty concentrating or making decisions? N N  Walking or climbing stairs? Y Y  Dressing or bathing? Y Y  Doing errands, shopping? [name]  Preparing Food and eating ? Y -  Using the Toilet? N -  In the past six months, have you accidently leaked urine? N -  Do you have problems with loss of bowel control? N -  Managing your Medications? (No Data) -  Managing your Finances? N -  Housekeeping or managing your Housekeeping? Y -    Fall/Depression Screening: PHQ 2/9 Scores 01/17/2015  PHQ - 2 Score 0    Assessment: 1.  Medication review:   Drugs sorted by system:  Neurologic/Psychologic: none  Cardiovascular: apixaban, atenolol, atorvastatin, cilostazol, potassium, torsemide  Pulmonary/Allergy: budesonide-formoterol, levalbuterol, tiotropium  Gastrointestinal: sennosides-docusate  Endocrine: metformin  Renal: none  Topical: none  Pain: acetaminophen, tramadol  Vitamins/Minerals: biotin, multivitamin  Infectious Diseases: none  Miscellaneous: none   Duplications in therapy: none noted Gaps in therapy: ACEi/ARB for diabetes Medications to avoid in the elderly: none noted Drug  interactions: none noted Other issues noted: none   Plan: 1.  Medication review:  All medications appear appropriate based on patient's problem list.  Patient has diabetes which is indication for ACEi/ARB.  Please consider use of ACEi or ARB in this patient if patient is able to tolerate.  Will send a fax to Dr. Nathanial Millman office with this information.  Will close out of pharmacy program.   [name], Pharm.D. Pharmacy Resident [place] [phone]

## 2015-01-21 ENCOUNTER — Other Ambulatory Visit: Payer: Self-pay | Admitting: Licensed Clinical Social Worker

## 2015-01-21 NOTE — Patient Outreach (Signed)
[place] [facility]) Care Management  01/21/2015  [name] [birth year] [id number]   Request from Erenest Rasher, RN to assign Pharmacy and SW, assigned Deanne Coffer, PharmD and [name], [place].  Thanks, Ronnell Freshwater. [place], [place] Assistant Phone: [phone] Fax: [phone]

## 2015-01-21 NOTE — Patient Outreach (Signed)
[facility] [facility]) Care Management  01/21/2015  JAMAYA SLEETH 1932-03-04 [id number]   Assessment-CSW completed second outreach to patient's residence. A female answer, Glorie Dowlen and shared that she was not in at the moment and to try back on 01/22/15.  Plan-CSW will make third outreach on 01/22/15.  [name], BSW, MSW, [email or web address] Phone: [phone] Fax: [phone]

## 2015-01-24 ENCOUNTER — Other Ambulatory Visit: Payer: Self-pay | Admitting: Pharmacist

## 2015-01-24 NOTE — Patient Outreach (Signed)
[facility]) Care Management  [place]   01/24/2015  [name] [birth year] [id number]  [name] is a 81yo who was referred to [place] for medication review and education.  Medication review completed on 01/18/15.  I made outreach call today to discuss medications with the patient.  Patient answered but states that now is not a good time to talk.  She requests a call back at another time.  Will plan a second outreach call next week.    [name], Pharm.D. Pharmacy Resident [place] [phone]

## 2015-01-25 ENCOUNTER — Other Ambulatory Visit: Payer: Self-pay | Admitting: Licensed Clinical Social Worker

## 2015-01-25 NOTE — Patient Outreach (Signed)
[facility]) Care Management  01/25/2015  [name] 1931/07/24 [id number]   Assessment-CSW completed outreach to patient on 01/25/15. Patient answered and provided HIPPA verifications. CSW introduced self, reason for call and of [facility] social work services. Patient reports that she is interested in receiving information on the assisted living placement process but is not ready to be placed at this time. CSW explained process for assisted living placement, insurance coverage, what services are provided at facilities, senior line advisor services free of charge through a place for mom and how to choose a facility that fits her needs. Patient unsure of what her income is each month or how much she receives from social security. Patient reports an interest in having both her husband and herself move together in a facility eventually. CSW educated patient on independent living apartments within the area and the difference between those and assisted living facilities. Patient agreeable to CSW making referral to a place for mom. Patient expresses understanding that they will contact her and provide information and senior advice on assisted living placement. Patient agreeable to this referral. Patient understands that they will also help them choose a facility that is based on their income. Patient appreciative of information. Patient aware that service is free of charge. CSW made referral to [facility]. [name] made plans to contact patient to provide information and assistance. CSW received call back from [name] who stated that patient was not interested in receiving assistance through [place] as she does not wish to talk on the phone about her needs. CSW completed outreach to patient and patient shares that she does not wish to continue receiving calls from people and agencies that she does not know. CSW expressed understanding but informed her that [name] is a Retail banker from [employer] for [employer], a referral that she previously agreed to on the phone. Patient shares "I have too many people calling here. I just don't want to talk about it over the phone. I rather do that in person." CSW questioned if she would like to schedule a home visit with Education officer, museum. Patient shares that she has enough information on assisted living placement. Patient states that she will "think it over until the time is right for [name] to go." Patient not agreeable to social work services through [facility] at this time.  Plan-CSW updated RNCM Erenest Rasher. CSW will discharge patient at this time.  [name], BSW, MSW, [email or web address] Phone: [phone] Fax: [phone]

## 2015-01-28 ENCOUNTER — Encounter: Payer: Self-pay | Admitting: Internal Medicine

## 2015-01-28 ENCOUNTER — Ambulatory Visit (INDEPENDENT_AMBULATORY_CARE_PROVIDER_SITE_OTHER): Payer: Medicare Other | Admitting: Internal Medicine

## 2015-01-28 VITALS — BP 130/80 | HR 95 | Temp 97.6°F | Wt 166.0 lb

## 2015-01-28 DIAGNOSIS — K5900 Constipation, unspecified: Secondary | ICD-10-CM

## 2015-01-28 DIAGNOSIS — L219 Seborrheic dermatitis, unspecified: Secondary | ICD-10-CM | POA: Diagnosis not present

## 2015-01-28 DIAGNOSIS — I6523 Occlusion and stenosis of bilateral carotid arteries: Secondary | ICD-10-CM | POA: Diagnosis not present

## 2015-01-28 DIAGNOSIS — D62 Acute posthemorrhagic anemia: Secondary | ICD-10-CM

## 2015-01-28 DIAGNOSIS — I442 Atrioventricular block, complete: Secondary | ICD-10-CM

## 2015-01-28 LAB — CUP PACEART INCLINIC DEVICE CHECK
Battery Remaining Longevity: 0 mo
Battery Voltage: 2.56 V
Implantable Lead Implant Date: 20051116
Implantable Lead Location: 753859
Lead Channel Impedance Value: 457 Ohm
Lead Channel Setting Pacing Amplitude: 1 V
Lead Channel Setting Pacing Amplitude: 2 V
Lead Channel Setting Pacing Pulse Width: 0.5 ms
Lead Channel Setting Sensing Sensitivity: 4 mV
MDC IDC LEAD IMPLANT DT: 20051116
MDC IDC LEAD LOCATION: 753860
MDC IDC MSMT BATTERY IMPEDANCE: 29700 Ohm
MDC IDC MSMT LEADCHNL RV IMPEDANCE VALUE: 454 Ohm
MDC IDC SESS DTM: 20161121130850
Pulse Gen Model: 5386
Pulse Gen Serial Number: 1409809

## 2015-01-28 MED ORDER — KETOCONAZOLE 2 % EX CREA: 1.0000 "application " | TOPICAL_CREAM | Freq: Every day | CUTANEOUS | Status: DC

## 2015-01-28 MED ORDER — LUBIPROSTONE 24 MCG PO CAPS: 24.0000 ug | ORAL_CAPSULE | Freq: Two times a day (BID) | ORAL | Status: DC

## 2015-01-28 NOTE — Assessment & Plan Note (Signed)
Blood counts improved at last visit, may be also some anemia of chronic disease. Her iron levels were back up previously and stopped her iron pills. No new symptoms and will recheck CBC at next visit.

## 2015-01-28 NOTE — Progress Notes (Signed)
Patient Care Team: Hoyt Koch, MD as PCP - General (Internal Medicine) Clent Jacks, MD (Ophthalmology) Elsie Stain, MD (Pulmonary Disease) Deboraha Sprang, MD (Cardiology) Jackolyn Confer, MD (General Surgery) Chauncey Cruel, MD (Hematology and Oncology) Mauricia Area, MD (Nephrology) Particia Nearing, MD (Dermatology) Laurence Spates, MD (Gastroenterology) Almedia Balls, MD (Orthopedic Surgery) Greg Cutter, LCSW as [place] (Licensed Clinical Social Worker) Cyndia Skeeters, [facility] as [place] Management (Pharmacist)   HPI  [name] is a 81 y.o. female Seen in followup for a pacemaker implanted for bradycardia. She antecedent history of atrial fibrillation for which she underwent pulmonary vein ablation first at [facility]. Recurrent atrial arrhythmias more frequent symptomatic and she underwent junction ablation with significant interval improvement.    She is here as an addon because of ERI on her device  Echo normal LV function 7/15; Myoview scan 7/15 normal LV function and no ischemia matched defects question artifact  Past Medical History  Diagnosis Date  . 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 Chronic renal insufficiency, stage III (moderate)     CrCl about 60 ml/min  . Diabetes ([place])   . Tobacco abuse   . Diastolic heart failure     Acute on Chronic  . Pacemaker     Permanent  . AF (atrial fibrillation) ([place])      AV ablation 9/09 [place] per Dr Ola Spurr - AV node ablation 9/11 Dr Caryl Comes  . Hyperlipidemia   . GERD (gastroesophageal reflux disease)   . [name] (coronary artery disease)     (not sure of this 11/10  . COPD (chronic obstructive pulmonary disease) (HCC)     emphysema -FeV1 73% DLCO 53% 5/09  . Invasive ductal carcinoma of breast ([place]) 2011    LEFT   . OA (osteoarthritis) of knee     RIGHT  . Right sided sciatica   . Sinoatrial node dysfunction (HCC)   . CHF  (congestive heart failure) ([place])   . Mental disorder   . Acute blood loss anemia 04/20/2012  . Depression 06/06/2012  . Hx of cardiovascular stress test     Lexiscan Myoview (09/2013):  No definite ischemia, EF 67%; low risk  . PAD (peripheral artery disease) (HCC)     w/hx right iliac/SFA stenting and left and rt leg PTA  . CVA (cerebral vascular accident) ([place])     no residual effects evident to pt   . History of skin cancer   . Eczema   . Complication of anesthesia     "psycotic episode" after hip surg - resolved  . Sleep apnea     associated with hypersomnia uses O2 2L/M at night and during naps also uses CPAP  . Shortness of breath dyspnea     walking distances   . Pneumonia     hx of   . Asthma   . Bronchitis     hx of   . Pain     pt states has pain in fingertips per right hand pt states has been told may be carpal tunnel  . Tremors of nervous system     hands bilat     Past Surgical History  Procedure Laterality Date  . Mastectomy, partial  02/03/2010    Left/Dr Rosenbower  . Cataract extraction, bilateral      with IOL/Dr Groat  . Breast lumpectomy      left breast  . Orif acetabular fracture  Left 04/19/2012    Procedure: OPEN REDUCTION INTERNAL FIXATION (ORIF) ACETABULAR FRACTURE;  Surgeon: Rozanna Box, MD;  Location: [facility];  Service: Orthopedics;  Laterality: Left;  . Atrial ablation surgery      x 2 - "did not help - had to have pacemaker"  . Vascular surgery      both legs   . Cardiac catheterization    . Total knee arthroplasty Right 10/16/2013    Procedure: RIGHT TOTAL KNEE ARTHROPLASTY;  Surgeon: Gearlean Alf, MD;  Location: [facility];  Service: Orthopedics;  Laterality: Right;  . Sun spot removed       forhead  . Total hip arthroplasty Left 10/31/2014    Procedure: LEFT TOTAL HIP ARTHROPLASTY POSTERIOR  APPROACH;  Surgeon: Gaynelle Arabian, MD;  Location: [facility];  Service: Orthopedics;  Laterality: Left;    Current Outpatient Prescriptions  Medication  Sig Dispense Refill  . acetaminophen (TYLENOL) 650 MG CR tablet Take 1,300 mg by mouth every 8 (eight) hours as needed for pain.    Marland Kitchen atenolol (TENORMIN) 25 MG tablet Take 1 tablet (25 mg total) by mouth every morning. 90 tablet 3  . atorvastatin (LIPITOR) 20 MG tablet Take 1 tablet (20 mg total) by mouth daily. 90 tablet 3  . Biotin (BIOTIN 5000) 5 MG CAPS Take 1 capsule by mouth at bedtime.    . budesonide-formoterol (SYMBICORT) 160-4.5 MCG/ACT inhaler Inhale 2 puffs into the lungs 2 (two) times daily.    . cilostazol (PLETAL) 100 MG tablet Take 100 mg by mouth 2 (two) times daily.    Marland Kitchen ELIQUIS 5 MG TABS tablet TAKE 1 TABLET TWICE DAILY  (MUST  ESTABLISH  NEW  PRIMARY CARE PROVIDER  FOR FUTURE REFILLS) 180 tablet 3  . ketoconazole (NIZORAL) 2 % cream Apply 1 application topically daily. 30 g 6  . levalbuterol (XOPENEX HFA) 45 MCG/ACT inhaler Inhale 2 puffs into the lungs every 6 (six) hours as needed. For shortness of breath    . lubiprostone (AMITIZA) 24 MCG capsule Take 1 capsule (24 mcg total) by mouth 2 (two) times daily with a meal. 60 capsule 6  . metFORMIN (GLUCOPHAGE) 500 MG tablet TAKE 1 TABLET TWICE DAILY WITH A MEAL 180 tablet 3  . Multiple Vitamins-Minerals (MULTIVITAMIN WITH MINERALS) tablet Take 1 tablet by mouth 2 (two) times daily.     . potassium chloride SA (K-DUR,KLOR-CON) 20 MEQ tablet Take 1 tablet (20 mEq total) by mouth daily. (Patient taking differently: Take 20 mEq by mouth 2 (two) times daily. ) 28 tablet 0  . senna-docusate (SENOKOT-S) 8.6-50 MG per tablet Take 1 tablet by mouth 2 (two) times daily. Stop for diarrhea or loose stools 60 tablet 12  . SPIRIVA HANDIHALER 18 MCG inhalation capsule INHALE THE CONTENTS OF 1 CAPSULE EVERY DAY 90 capsule 3  . torsemide (DEMADEX) 10 MG tablet TAKE 1 TABLET EVERY DAY 90 tablet 1  . traMADol (ULTRAM) 50 MG tablet Take 1-2 tablets (50-100 mg total) by mouth every 6 (six) hours as needed (mild pain). 60 tablet 1   No current  facility-administered medications for this visit.    Allergies  Allergen Reactions  . Cholestatin     RAGWEED SEASON...sneezing   . Sulfur Itching    Review of Systems negative except from HPI and PMH  Physical Exam There were no vitals taken for this visit. Well developed and well nourished in no acute distress HENT normal E scleral and icterus clear Neck Supple JVP flat; carotids  brisk and full Clear to ausculation Device pocket well healed; without hematoma or erythema.  There is no tethering Regular rate and rhythm, no murmurs gallops or rub Soft with active bowel sounds No clubbing cyanosis  Edema Alert and oriented, grossly normal motor and sensory function Skin Warm and Dry  ECG demonstrates AV pacing  Assessment and  Plan  Atrial fibrillation-persitent  Sinus bradycardia  Complete heart block S./P. AV junction ablation  COPD  Cardiomyopathy  Pacemaker-St. Jude   Her device has reached ERI.  We have reviewed the benefits and risks of generator replacement.  These include but are not limited to lead fracture and infection.  The patient understands, agrees and is willing to proceed.

## 2015-01-28 NOTE — Assessment & Plan Note (Signed)
She is not taking the senokot as we discussed last time (1 pill BID then 2 pills BID if no improvement). She does not wish to try that. Rx for amitiza to see if this helps. Also talked to her about exercise and more water and fiber intake to help with her regularity. Reviewed medication list for anything that could be worsening. Her tramadol could and she states she is not using often but her daughter disagrees and states that she uses it quite often. No signs of impaction or SBO on exam today (no pain and good BS).

## 2015-01-28 NOTE — Patient Instructions (Addendum)
We have sent in a medicine for the constipation called amitiza. This is a medicine that you take twice a day for constipation. It can take 1-2 weeks for it to get to full effect and should make it easier to go more often. If you get diarrhea call our office and we can change the dose or switch medicines.   You can start taking the metformin only once per day.   We have sent in a new cream for the eczema on the scalp called ketoconazole cream that you use daily on the area.

## 2015-01-28 NOTE — Assessment & Plan Note (Signed)
She has tried and failed hydrocortisone lotion. Rx for ketoconazole cream and she will try that.

## 2015-01-28 NOTE — Progress Notes (Signed)
   Subjective:    Patient ID: [name], female    DOB: May 02, 1931, 81 y.o.   MRN: [id number]  HPI The patient is an 81 YO female coming in for follow up of her blood loss anemia. She has stopped her iron pills as requested last visit. Denies fatigue, SOB, syncope or pre-syncope. She denies any new complaints or side effects of medicines. She has finished her breast cancer treatment and is now 5 years out. She is having some other concerns and complaints. She is still constipated and is using the senokot daily without good results. She sometimes takes it twice a day. She gets a lot of gas with it and goes about 1-2 weeks between bowel movements. She is not taking otc fiber and is drinking about 3 glasses of water per day. Eating less and not as much.  Next concern is that she still has rash on her scalp line. She tried the hydrocortisone cream which did nothing. She wants to try something else. Minimal itching. She does not like the way it looks. No worsening but feels it has not improved.   Review of Systems  Constitutional: Positive for activity change and appetite change. Negative for fever, chills, fatigue and unexpected weight change.  Respiratory: Negative for cough, chest tightness, shortness of breath, wheezing and stridor.   Cardiovascular: Positive for leg swelling. Negative for chest pain and palpitations.  Gastrointestinal: Positive for constipation. Negative for nausea, vomiting, diarrhea, blood in stool and abdominal distention.  Musculoskeletal: Positive for arthralgias and gait problem.  Skin: Positive for rash. Negative for color change and wound.  Neurological: Positive for weakness. Negative for dizziness, tremors, syncope, numbness and headaches.      Objective:   Physical Exam  Constitutional: She appears well-developed and well-nourished.  HENT:  Head: Normocephalic and atraumatic.  Eyes: EOM are normal. Pupils are equal, round, and reactive to light.  Neck: Normal  range of motion. Neck supple. No JVD present.  Cardiovascular: Normal rate.   Paced  Pulmonary/Chest: Effort normal and breath sounds normal. No respiratory distress. She has no wheezes. She has no rales. She exhibits no tenderness.  Abdominal: Soft. Bowel sounds are normal. She exhibits no distension. There is no tenderness. There is no rebound and no guarding.  Neurological: She is alert. No cranial nerve deficit.  Skin: Skin is warm and dry. Rash noted.  Seborrheic dermatitis at the scalp line and by the left ear.   Filed Vitals:   01/28/15 1056  BP: 130/80  Pulse: 95  Temp: 97.6 F (36.4 C)  TempSrc: Oral  Weight: 166 lb (75.297 kg)      Assessment & Plan:

## 2015-01-28 NOTE — Progress Notes (Signed)
Pre visit review using our clinic review tool, if applicable. No additional management support is needed unless otherwise documented below in the visit note. 

## 2015-01-29 ENCOUNTER — Other Ambulatory Visit: Payer: Self-pay | Admitting: Pharmacist

## 2015-01-29 NOTE — Patient Outreach (Signed)
[facility]) Care Management  01/29/2015  [name] [birth year] [id number]   [name] is a 81yo who was referred to [place] for medication review and education. Medication review completed on 01/18/15. I made second outreach call today to discuss medications with the patient. Patient reports she is interested in reviewing her medications but does not wish to review medications over the phone.  I set up a home visit for 02/05/15 at [place], Pharm.D. Pharmacy Resident [place] [phone]

## 2015-02-04 ENCOUNTER — Encounter: Payer: Self-pay | Admitting: Internal Medicine

## 2015-02-05 ENCOUNTER — Other Ambulatory Visit: Payer: Self-pay | Admitting: Pharmacist

## 2015-02-05 NOTE — Patient Outreach (Signed)
[facility]) Care Management  02/05/2015  [name] 12/05/1931 [id number]   [name] is a 81yo who was referred to [place] for medication review and education.  I made home visit today as scheduled to review patient's medications with her.  Patient answered the door but states "I wish I had not told you to come today, my husband is in the hospital.  I do not have all of our medications here for you to review."  I confirmed with patient that she had all of her medications available, but that her husbands medications were not in the home.  I informed patient that I was only referred to review her medications.  Patient still wishes to reschedule visit.  Patient denies any concerns with her medications at this time.  Initial home visit rescheduled for December 13th, 2016 at 2:30 PM.     [name], Pharm.D. Pharmacy Resident [place] [phone]

## 2015-02-06 ENCOUNTER — Encounter: Payer: Self-pay | Admitting: Pulmonary Disease

## 2015-02-06 ENCOUNTER — Ambulatory Visit (INDEPENDENT_AMBULATORY_CARE_PROVIDER_SITE_OTHER): Payer: Medicare Other | Admitting: Pulmonary Disease

## 2015-02-06 ENCOUNTER — Telehealth: Payer: Self-pay | Admitting: Internal Medicine

## 2015-02-06 VITALS — BP 94/60 | HR 63 | Temp 97.9°F | Ht 65.5 in | Wt 163.8 lb

## 2015-02-06 DIAGNOSIS — Z01812 Encounter for preprocedural laboratory examination: Secondary | ICD-10-CM

## 2015-02-06 DIAGNOSIS — I6523 Occlusion and stenosis of bilateral carotid arteries: Secondary | ICD-10-CM

## 2015-02-06 DIAGNOSIS — R001 Bradycardia, unspecified: Secondary | ICD-10-CM

## 2015-02-06 DIAGNOSIS — J449 Chronic obstructive pulmonary disease, unspecified: Secondary | ICD-10-CM

## 2015-02-06 MED ORDER — DOXYCYCLINE HYCLATE 100 MG PO TABS: 100.0000 mg | ORAL_TABLET | Freq: Two times a day (BID) | ORAL | Status: DC

## 2015-02-06 MED ORDER — PREDNISONE 10 MG PO TABS: ORAL_TABLET | ORAL | Status: DC

## 2015-02-06 NOTE — Addendum Note (Signed)
Addended by: Par[place] Poisson E on: 02/06/2015 02:31 PM   Modules accepted: Orders, Medications

## 2015-02-06 NOTE — Patient Instructions (Signed)
We will start you on doxycycline 100 mg twice a day for 7 days. Prednisone 40 mg taper. Reduce dose by 10 mg every 2 days.

## 2015-02-06 NOTE — Progress Notes (Signed)
Subjective:    Patient ID: [name], female    DOB: 02/06/1932, 81 y.o.   MRN: [id number]  PROBLEM LIST COPD Heavy ex smoker OSA on CPAP with 2Lt O2  HPI [name] has history of COPD with frequent exacerbations in the past. She is a former patient of [name] and a heavy former smoker. She smoked one and half to 3 packs per day for nearly 60 years. She quit 2013. She is actually improved after quitting in terms of her respiratory status. In the clinic today she she appears to be having an acute exacerbation. The symptoms started with the sniffles. Does not progress to more shortness of breath, wheezing she has only occasional cough, no sputum production and denies any fevers or chills. He feels that her O2 levels have dropped during the day. She is on nocturnal O2 at 2 L which she is continuing.  She underwent right hip and total knee replacement to the past year. She has apparently tolerated these procedures well. She has history of atrial fibrillation and failed several ablations. She has a permanent pacemaker. There is a mention of amiodarone pulmonary toxicity in her problem list but I could not obtain any details of this in her past pulmonary notes. She is not on amiodarone at present.  Past Medical History  Diagnosis Date  . 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 Chronic renal insufficiency, stage III (moderate)     CrCl about 60 ml/min  . Diabetes ([place])   . Tobacco abuse   . Diastolic heart failure     Acute on Chronic  . Pacemaker     Permanent  . AF (atrial fibrillation) ([place])      AV ablation 9/09 [facility] per Dr Ola Spurr - AV node ablation 9/11 Dr Caryl Comes  . Hyperlipidemia   . GERD (gastroesophageal reflux disease)   . [name] (coronary artery disease)     (not sure of this 11/10  . COPD (chronic obstructive pulmonary disease) (HCC)     emphysema -FeV1 73% DLCO 53% 5/09  . Invasive ductal carcinoma of breast ([place]) 2011    LEFT   . OA (osteoarthritis) of knee       RIGHT  . Right sided sciatica   . Sinoatrial node dysfunction (HCC)   . CHF (congestive heart failure) ([place])   . Mental disorder   . Acute blood loss anemia 04/20/2012  . Depression 06/06/2012  . Hx of cardiovascular stress test     Lexiscan Myoview (09/2013):  No definite ischemia, EF 67%; low risk  . PAD (peripheral artery disease) (HCC)     w/hx right iliac/SFA stenting and left and rt leg PTA  . CVA (cerebral vascular accident) ([place])     no residual effects evident to pt   . History of skin cancer   . Eczema   . Complication of anesthesia     "psycotic episode" after hip surg - resolved  . Sleep apnea     associated with hypersomnia uses O2 2L/M at night and during naps also uses CPAP  . Shortness of breath dyspnea     walking distances   . Pneumonia     hx of   . Asthma   . Bronchitis     hx of   . Pain     pt states has pain in fingertips per right hand pt states has been told may be carpal tunnel  . Tremors of nervous system     hands  bilat      Review of Systems  C/O dyspnea on exertion, cough, sputum production, wheezing. Denies any chest pain, palpitations. Denies any nausea, vomiting, diarrhea, constipation. No malaise, weight loss, fatigue. All other review of systems are negative.  CXR (10/24/14) No active cardiopulmonary disease.  PFTs (02/20/10) FVC 1.66 L/S (56%) FEV1 1.289 L/S (59%) F/F 74     Objective:   Physical Exam  Blood pressure 94/60, pulse 63, temperature 97.9 F (36.6 C), temperature source Oral, height 5' 5.5" (1.664 m), weight 163 lb 12.8 oz (74.299 kg), SpO2 98 % at rest, 98% on exertion  Gen.: Pleasant elderly female. No apparent distress Neuro: No gross focal deficits. Neck: No JVD, lymphadenopathy, thyromegaly. RS: Clear antr. No wheeze or crackles.  CVS: S1-S2 heard, no murmurs rubs gallops. Abdomen: Soft, positive bowel sounds. Extremities: No edema    Assessment & Plan:   COPD. She is having an acute exacerbation  probably set off by a respiratory tract infection. Her O2 levels remained at 98% at rest and on exertion. I will give her doxycycline for 7 days and a prednisone taper.  She is continuing on her current inhalers. However she feels she cannot afford anymore. She will try to get in touch with her insurance company to see which ones are covered. I do not have any recent PFTs, spirometry to take a look at. She will get a set of PFTs at the time of her next visit.  There is a question of history of amiodarone pulmonary toxicity. I will reassess this after getting her PFTs. Her lung imaging is clear.  Heavy ex smoker. She quit 3 years ago. She misses her cigarettes but is able to keep of them. She will qualify for a screening CT program of the chest. I will address this with her at her next visit.  OSA She is compliant with his CPAP with no issues.  Plan: - Doxycycline 100 mg twice a day X 70s - Prednisone 40 mg a day. Reduce dose by 10 mg every 2 days and he completely.  Return to clinic in 6 months  Marshell Garfinkel MD [facility] Pager [phone] If no answer or after 3pm call: [phone] 02/06/2015, 1:53 PM

## 2015-02-06 NOTE — Telephone Encounter (Signed)
New Message  02/08/2015 at 9:30a is the time they chose for the surgery. Please call back to discuss

## 2015-02-06 NOTE — Telephone Encounter (Signed)
Called care giver back.  She was just sending the date and time that they would like the procedure done.

## 2015-02-07 ENCOUNTER — Other Ambulatory Visit: Payer: Self-pay

## 2015-02-07 NOTE — Telephone Encounter (Signed)
I left a message for [name] to call.  02/08/15 was not a date given (Dr. Caryl Comes out that day)- ? If they meant 12/12.

## 2015-02-08 ENCOUNTER — Encounter: Payer: Self-pay | Admitting: Internal Medicine

## 2015-02-08 ENCOUNTER — Encounter: Payer: Self-pay | Admitting: *Deleted

## 2015-02-08 ENCOUNTER — Telehealth: Payer: Self-pay | Admitting: Internal Medicine

## 2015-02-08 NOTE — Telephone Encounter (Signed)
I spoke with [name] today and made her aware I had to cancel a patient on 02/13/15 at 12:30 pm. The patient was agreeable with this date and time. [name] was given verbal instructions for the patient's procedure and she is aware a copy of the instructions will be placed at the front desk for pickup when the patient comes for labs on 02/11/15.

## 2015-02-08 NOTE — Telephone Encounter (Signed)
I called and spoke with the patient and answered her questions.

## 2015-02-08 NOTE — Patient Outreach (Signed)
[facility]) Care Management  February 07, 2015   [name] 12/02/1931 [id number]    This RNCM completed home visit with patient and her husband. Patient states she recently visited her doctor because she has a respiratory infection.  Patient states she was prescribed an antibiotic and prednisone which she has both.     Patient and this RNCM reviewed her care plan, patient stated she is still interested in moving to an assisted living facility.  Currently patient and husband have a privately paid person who assist she and husband by grocery shopping, taking them on trips and running other errands.  Patient stated this person also assist them by filling their medication boxes weekly.  Patients husband was recently discharged from acute care after being admitted with pneumonia.   Patient confirms having an appointment with [facility] Pharmacist [name] to explore lower cost inhalers.  Patient continues to admit the need to move into an assisted living facility because she recognize she and husbands growing need for assistance with ADLs, IADLs.  Plan is to move in the beginning of 2017.  [facility] LCSW, [name] has sent information out to patient on area assisted living facilities.  .  Patient and this RNCM agreed to meet again on December 22 to update on her progress in selection of an assisted living.

## 2015-02-08 NOTE — Telephone Encounter (Signed)
New Message  Pt calling to speak w/ RN about upcoming surgery next week. Please call back and discuss.

## 2015-02-11 ENCOUNTER — Other Ambulatory Visit (INDEPENDENT_AMBULATORY_CARE_PROVIDER_SITE_OTHER): Payer: Medicare Other | Admitting: *Deleted

## 2015-02-11 DIAGNOSIS — Z01812 Encounter for preprocedural laboratory examination: Secondary | ICD-10-CM

## 2015-02-11 DIAGNOSIS — I498 Other specified cardiac arrhythmias: Secondary | ICD-10-CM | POA: Diagnosis not present

## 2015-02-11 DIAGNOSIS — R001 Bradycardia, unspecified: Secondary | ICD-10-CM

## 2015-02-11 LAB — CBC WITH DIFFERENTIAL/PLATELET
BASOS ABS: 0 10*3/uL (ref 0.0–0.1)
Basophils Relative: 0 % (ref 0–1)
EOS ABS: 0.2 10*3/uL (ref 0.0–0.7)
Eosinophils Relative: 1 % (ref 0–5)
HEMATOCRIT: 33.7 % — AB (ref 36.0–46.0)
Hemoglobin: 10.5 g/dL — ABNORMAL LOW (ref 12.0–15.0)
LYMPHS ABS: 2.1 10*3/uL (ref 0.7–4.0)
LYMPHS PCT: 13 % (ref 12–46)
MCH: 24.2 pg — ABNORMAL LOW (ref 26.0–34.0)
MCHC: 31.2 g/dL (ref 30.0–36.0)
MCV: 77.8 fL — ABNORMAL LOW (ref 78.0–100.0)
MPV: 9.4 fL (ref 8.6–12.4)
Monocytes Absolute: 0.8 10*3/uL (ref 0.1–1.0)
Monocytes Relative: 5 % (ref 3–12)
NEUTROS PCT: 81 % — AB (ref 43–77)
Neutro Abs: 13.2 10*3/uL — ABNORMAL HIGH (ref 1.7–7.7)
PLATELETS: 450 10*3/uL — AB (ref 150–400)
RBC: 4.33 MIL/uL (ref 3.87–5.11)
RDW: 18.7 % — AB (ref 11.5–15.5)
WBC: 16.3 10*3/uL — AB (ref 4.0–10.5)

## 2015-02-11 LAB — BASIC METABOLIC PANEL
BUN: 30 mg/dL — AB (ref 7–25)
CHLORIDE: 103 mmol/L (ref 98–110)
CO2: 23 mmol/L (ref 20–31)
Calcium: 10.1 mg/dL (ref 8.6–10.4)
Creat: 1.14 mg/dL — ABNORMAL HIGH (ref 0.60–0.88)
Glucose, Bld: 106 mg/dL — ABNORMAL HIGH (ref 65–99)
POTASSIUM: 4.8 mmol/L (ref 3.5–5.3)
Sodium: 139 mmol/L (ref 135–146)

## 2015-02-13 ENCOUNTER — Ambulatory Visit (HOSPITAL_COMMUNITY)
Admission: RE | Admit: 2015-02-13 | Discharge: 2015-02-13 | Disposition: A | Discharge status: Home / Self Care | Payer: Medicare Other | Source: Ambulatory Visit | Attending: Internal Medicine | Admitting: Internal Medicine

## 2015-02-13 ENCOUNTER — Encounter (HOSPITAL_COMMUNITY)
Admission: RE | Disposition: A | Discharge status: Home / Self Care | Payer: Medicare Other | Source: Ambulatory Visit | Attending: Internal Medicine

## 2015-02-13 DIAGNOSIS — J45909 Unspecified asthma, uncomplicated: Secondary | ICD-10-CM | POA: Insufficient documentation

## 2015-02-13 DIAGNOSIS — Z96642 Presence of left artificial hip joint: Secondary | ICD-10-CM | POA: Diagnosis not present

## 2015-02-13 DIAGNOSIS — I2510 Atherosclerotic heart disease of native coronary artery without angina pectoris: Secondary | ICD-10-CM | POA: Diagnosis not present

## 2015-02-13 DIAGNOSIS — K219 Gastro-esophageal reflux disease without esophagitis: Secondary | ICD-10-CM | POA: Insufficient documentation

## 2015-02-13 DIAGNOSIS — J449 Chronic obstructive pulmonary disease, unspecified: Secondary | ICD-10-CM | POA: Diagnosis not present

## 2015-02-13 DIAGNOSIS — Z7951 Long term (current) use of inhaled steroids: Secondary | ICD-10-CM | POA: Insufficient documentation

## 2015-02-13 DIAGNOSIS — I442 Atrioventricular block, complete: Secondary | ICD-10-CM | POA: Diagnosis not present

## 2015-02-13 DIAGNOSIS — N183 Chronic kidney disease, stage 3 (moderate): Secondary | ICD-10-CM | POA: Insufficient documentation

## 2015-02-13 DIAGNOSIS — E1151 Type 2 diabetes mellitus with diabetic peripheral angiopathy without gangrene: Secondary | ICD-10-CM | POA: Diagnosis not present

## 2015-02-13 DIAGNOSIS — I130 Hypertensive heart and chronic kidney disease with heart failure and stage 1 through stage 4 chronic kidney disease, or unspecified chronic kidney disease: Secondary | ICD-10-CM | POA: Diagnosis not present

## 2015-02-13 DIAGNOSIS — Z96651 Presence of right artificial knee joint: Secondary | ICD-10-CM | POA: Diagnosis not present

## 2015-02-13 DIAGNOSIS — Z7984 Long term (current) use of oral hypoglycemic drugs: Secondary | ICD-10-CM | POA: Diagnosis not present

## 2015-02-13 DIAGNOSIS — E1122 Type 2 diabetes mellitus with diabetic chronic kidney disease: Secondary | ICD-10-CM | POA: Insufficient documentation

## 2015-02-13 DIAGNOSIS — Z45010 Encounter for checking and testing of cardiac pacemaker pulse generator [battery]: Secondary | ICD-10-CM | POA: Insufficient documentation

## 2015-02-13 DIAGNOSIS — Z8673 Personal history of transient ischemic attack (TIA), and cerebral infarction without residual deficits: Secondary | ICD-10-CM | POA: Insufficient documentation

## 2015-02-13 DIAGNOSIS — E785 Hyperlipidemia, unspecified: Secondary | ICD-10-CM | POA: Diagnosis not present

## 2015-02-13 DIAGNOSIS — I5032 Chronic diastolic (congestive) heart failure: Secondary | ICD-10-CM | POA: Insufficient documentation

## 2015-02-13 DIAGNOSIS — G4730 Sleep apnea, unspecified: Secondary | ICD-10-CM | POA: Diagnosis not present

## 2015-02-13 DIAGNOSIS — Z7901 Long term (current) use of anticoagulants: Secondary | ICD-10-CM | POA: Insufficient documentation

## 2015-02-13 DIAGNOSIS — Z85828 Personal history of other malignant neoplasm of skin: Secondary | ICD-10-CM | POA: Insufficient documentation

## 2015-02-13 DIAGNOSIS — Z950 Presence of cardiac pacemaker: Secondary | ICD-10-CM | POA: Diagnosis present

## 2015-02-13 DIAGNOSIS — F329 Major depressive disorder, single episode, unspecified: Secondary | ICD-10-CM | POA: Diagnosis not present

## 2015-02-13 DIAGNOSIS — R001 Bradycardia, unspecified: Secondary | ICD-10-CM

## 2015-02-13 DIAGNOSIS — I495 Sick sinus syndrome: Secondary | ICD-10-CM | POA: Diagnosis not present

## 2015-02-13 DIAGNOSIS — Z853 Personal history of malignant neoplasm of breast: Secondary | ICD-10-CM | POA: Insufficient documentation

## 2015-02-13 DIAGNOSIS — I429 Cardiomyopathy, unspecified: Secondary | ICD-10-CM | POA: Diagnosis not present

## 2015-02-13 DIAGNOSIS — Z720 Tobacco use: Secondary | ICD-10-CM | POA: Insufficient documentation

## 2015-02-13 DIAGNOSIS — I4891 Unspecified atrial fibrillation: Secondary | ICD-10-CM | POA: Diagnosis present

## 2015-02-13 HISTORY — PX: EP IMPLANTABLE DEVICE: SHX172B

## 2015-02-13 LAB — GLUCOSE, CAPILLARY: GLUCOSE-CAPILLARY: 112 mg/dL — AB (ref 65–99)

## 2015-02-13 LAB — SURGICAL PCR SCREEN
MRSA, PCR: NEGATIVE
STAPHYLOCOCCUS AUREUS: NEGATIVE

## 2015-02-13 SURGERY — PPM/BIV PPM GENERATOR CHANGEOUT
Anesthesia: LOCAL

## 2015-02-13 MED ORDER — MUPIROCIN 2 % EX OINT
TOPICAL_OINTMENT | CUTANEOUS | Status: AC
  Administered 2015-02-13: 1 via TOPICAL
  Filled 2015-02-13: qty 22

## 2015-02-13 MED ORDER — LIDOCAINE HCL (PF) 1 % IJ SOLN
INTRAMUSCULAR | Status: AC
  Filled 2015-02-13: qty 60

## 2015-02-13 MED ORDER — SODIUM CHLORIDE 0.9 % IV SOLN: INTRAVENOUS | Status: DC

## 2015-02-13 MED ORDER — MUPIROCIN 2 % EX OINT
1.0000 "application " | TOPICAL_OINTMENT | Freq: Once | CUTANEOUS | Status: AC
  Administered 2015-02-13: 1 via TOPICAL

## 2015-02-13 MED ORDER — SODIUM CHLORIDE 0.9 % IV SOLN
INTRAVENOUS | Status: DC
  Administered 2015-02-13: 12:00:00 via INTRAVENOUS

## 2015-02-13 MED ORDER — ACETAMINOPHEN 325 MG PO TABS
325.0000 mg | ORAL_TABLET | ORAL | Status: DC | PRN
  Filled 2015-02-13: qty 2

## 2015-02-13 MED ORDER — ONDANSETRON HCL 4 MG/2ML IJ SOLN: 4.0000 mg | Freq: Four times a day (QID) | INTRAMUSCULAR | Status: DC | PRN

## 2015-02-13 MED ORDER — FENTANYL CITRATE (PF) 100 MCG/2ML IJ SOLN
INTRAMUSCULAR | Status: AC
  Filled 2015-02-13: qty 2

## 2015-02-13 MED ORDER — CEFAZOLIN SODIUM-DEXTROSE 2-3 GM-% IV SOLR
2.0000 g | INTRAVENOUS | Status: AC
  Administered 2015-02-13: 2 g via INTRAVENOUS

## 2015-02-13 MED ORDER — MIDAZOLAM HCL 5 MG/5ML IJ SOLN
INTRAMUSCULAR | Status: AC
  Filled 2015-02-13: qty 5

## 2015-02-13 MED ORDER — SODIUM CHLORIDE 0.9 % IR SOLN
Status: AC
  Filled 2015-02-13: qty 2

## 2015-02-13 MED ORDER — SODIUM CHLORIDE 0.9 % IR SOLN
80.0000 mg | Status: AC
  Administered 2015-02-13: 80 mg

## 2015-02-13 MED ORDER — CHLORHEXIDINE GLUCONATE 4 % EX LIQD: 60.0000 mL | Freq: Once | CUTANEOUS | Status: DC

## 2015-02-13 MED ORDER — CEFAZOLIN SODIUM-DEXTROSE 2-3 GM-% IV SOLR
INTRAVENOUS | Status: AC
  Filled 2015-02-13: qty 50

## 2015-02-13 SURGICAL SUPPLY — 5 items
CABLE SURGICAL S-101-97-12 (CABLE) ×1 IMPLANT
HEMOSTAT SURGICEL 2X4 FIBR (HEMOSTASIS) ×1 IMPLANT
PAD DEFIB LIFELINK (PAD) ×1 IMPLANT
PPM ASSURITY DR PM2240 (Pacemaker) ×1 IMPLANT
TRAY PACEMAKER INSERTION (PACKS) ×1 IMPLANT

## 2015-02-13 NOTE — H&P (View-Only) (Signed)
Patient Care Team: Hoyt Koch, MD as PCP - General (Internal Medicine) Clent Jacks, MD (Ophthalmology) Elsie Stain, MD (Pulmonary Disease) Deboraha Sprang, MD (Cardiology) Jackolyn Confer, MD (General Surgery) Chauncey Cruel, MD (Hematology and Oncology) Mauricia Area, MD (Nephrology) Particia Nearing, MD (Dermatology) Laurence Spates, MD (Gastroenterology) Almedia Balls, MD (Orthopedic Surgery) Greg Cutter, LCSW as [place] (Licensed Clinical Social Worker) Cyndia Skeeters, [facility] as [place] (Pharmacist)   HPI  [name] is a 81 y.o. female Seen in followup for a pacemaker implanted for bradycardia. She antecedent history of atrial fibrillation for which she underwent pulmonary vein ablation first at [facility]. Recurrent atrial arrhythmias more frequent symptomatic and she underwent junction ablation with significant interval improvement.    She is here as an addon because of ERI on her device  Echo normal LV function 7/15; Myoview scan 7/15 normal LV function and no ischemia matched defects question artifact  Past Medical History  Diagnosis Date  . 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 Chronic renal insufficiency, stage III (moderate)     CrCl about 60 ml/min  . Diabetes ([place])   . Tobacco abuse   . Diastolic heart failure     Acute on Chronic  . Pacemaker     Permanent  . AF (atrial fibrillation) ([place])      AV ablation 9/09 [place] per Dr Ola Spurr - AV node ablation 9/11 Dr Caryl Comes  . Hyperlipidemia   . GERD (gastroesophageal reflux disease)   . [name] (coronary artery disease)     (not sure of this 11/10  . COPD (chronic obstructive pulmonary disease) (HCC)     emphysema -FeV1 73% DLCO 53% 5/09  . Invasive ductal carcinoma of breast ([place]) 2011    LEFT   . OA (osteoarthritis) of knee     RIGHT  . Right sided sciatica   . Sinoatrial node dysfunction (HCC)   . CHF  (congestive heart failure) ([place])   . Mental disorder   . Acute blood loss anemia 04/20/2012  . Depression 06/06/2012  . Hx of cardiovascular stress test     Lexiscan Myoview (09/2013):  No definite ischemia, EF 67%; low risk  . PAD (peripheral artery disease) (HCC)     w/hx right iliac/SFA stenting and left and rt leg PTA  . CVA (cerebral vascular accident) ([place])     no residual effects evident to pt   . History of skin cancer   . Eczema   . Complication of anesthesia     "psycotic episode" after hip surg - resolved  . Sleep apnea     associated with hypersomnia uses O2 2L/M at night and during naps also uses CPAP  . Shortness of breath dyspnea     walking distances   . Pneumonia     hx of   . Asthma   . Bronchitis     hx of   . Pain     pt states has pain in fingertips per right hand pt states has been told may be carpal tunnel  . Tremors of nervous system     hands bilat     Past Surgical History  Procedure Laterality Date  . Mastectomy, partial  02/03/2010    Left/Dr Rosenbower  . Cataract extraction, bilateral      with IOL/Dr Groat  . Breast lumpectomy      left breast  . Orif acetabular fracture  Left 04/19/2012    Procedure: OPEN REDUCTION INTERNAL FIXATION (ORIF) ACETABULAR FRACTURE;  Surgeon: Rozanna Box, MD;  Location: [place];  Service: Orthopedics;  Laterality: Left;  . Atrial ablation surgery      x 2 - "did not help - had to have pacemaker"  . Vascular surgery      both legs   . Cardiac catheterization    . Total knee arthroplasty Right 10/16/2013    Procedure: RIGHT TOTAL KNEE ARTHROPLASTY;  Surgeon: Gearlean Alf, MD;  Location: [facility];  Service: Orthopedics;  Laterality: Right;  . Sun spot removed       forhead  . Total hip arthroplasty Left 10/31/2014    Procedure: LEFT TOTAL HIP ARTHROPLASTY POSTERIOR  APPROACH;  Surgeon: Gaynelle Arabian, MD;  Location: [facility];  Service: Orthopedics;  Laterality: Left;    Current Outpatient Prescriptions  Medication  Sig Dispense Refill  . acetaminophen (TYLENOL) 650 MG CR tablet Take 1,300 mg by mouth every 8 (eight) hours as needed for pain.    Marland Kitchen atenolol (TENORMIN) 25 MG tablet Take 1 tablet (25 mg total) by mouth every morning. 90 tablet 3  . atorvastatin (LIPITOR) 20 MG tablet Take 1 tablet (20 mg total) by mouth daily. 90 tablet 3  . Biotin (BIOTIN 5000) 5 MG CAPS Take 1 capsule by mouth at bedtime.    . budesonide-formoterol (SYMBICORT) 160-4.5 MCG/ACT inhaler Inhale 2 puffs into the lungs 2 (two) times daily.    . cilostazol (PLETAL) 100 MG tablet Take 100 mg by mouth 2 (two) times daily.    Marland Kitchen ELIQUIS 5 MG TABS tablet TAKE 1 TABLET TWICE DAILY  (MUST  ESTABLISH  NEW  PRIMARY CARE PROVIDER  FOR FUTURE REFILLS) 180 tablet 3  . ketoconazole (NIZORAL) 2 % cream Apply 1 application topically daily. 30 g 6  . levalbuterol (XOPENEX HFA) 45 MCG/ACT inhaler Inhale 2 puffs into the lungs every 6 (six) hours as needed. For shortness of breath    . lubiprostone (AMITIZA) 24 MCG capsule Take 1 capsule (24 mcg total) by mouth 2 (two) times daily with a meal. 60 capsule 6  . metFORMIN (GLUCOPHAGE) 500 MG tablet TAKE 1 TABLET TWICE DAILY WITH A MEAL 180 tablet 3  . Multiple Vitamins-Minerals (MULTIVITAMIN WITH MINERALS) tablet Take 1 tablet by mouth 2 (two) times daily.     . potassium chloride SA (K-DUR,KLOR-CON) 20 MEQ tablet Take 1 tablet (20 mEq total) by mouth daily. (Patient taking differently: Take 20 mEq by mouth 2 (two) times daily. ) 28 tablet 0  . senna-docusate (SENOKOT-S) 8.6-50 MG per tablet Take 1 tablet by mouth 2 (two) times daily. Stop for diarrhea or loose stools 60 tablet 12  . SPIRIVA HANDIHALER 18 MCG inhalation capsule INHALE THE CONTENTS OF 1 CAPSULE EVERY DAY 90 capsule 3  . torsemide (DEMADEX) 10 MG tablet TAKE 1 TABLET EVERY DAY 90 tablet 1  . traMADol (ULTRAM) 50 MG tablet Take 1-2 tablets (50-100 mg total) by mouth every 6 (six) hours as needed (mild pain). 60 tablet 1   No current  facility-administered medications for this visit.    Allergies  Allergen Reactions  . Cholestatin     RAGWEED SEASON...sneezing   . Sulfur Itching    Review of Systems negative except from HPI and PMH  Physical Exam There were no vitals taken for this visit. Well developed and well nourished in no acute distress HENT normal E scleral and icterus clear Neck Supple JVP flat; carotids  brisk and full Clear to ausculation Device pocket well healed; without hematoma or erythema.  There is no tethering Regular rate and rhythm, no murmurs gallops or rub Soft with active bowel sounds No clubbing cyanosis  Edema Alert and oriented, grossly normal motor and sensory function Skin Warm and Dry  ECG demonstrates AV pacing  Assessment and  Plan  Atrial fibrillation-persitent  Sinus bradycardia  Complete heart block S./P. AV junction ablation  COPD  Cardiomyopathy  Pacemaker-St. Jude   Her device has reached ERI.  We have reviewed the benefits and risks of generator replacement.  These include but are not limited to lead fracture and infection.  The patient understands, agrees and is willing to proceed.

## 2015-02-13 NOTE — Interval H&P Note (Signed)
History and Physical Interval Note:  02/13/2015 3:08 PM  [name]  has presented today for surgery, with the diagnosis of end of life generator  The various methods of treatment have been discussed with the patient and family. After consideration of risks, benefits and other options for treatment, the patient has consented to  Procedure(s): PPM Generator Changeout-St. Jude device (N/A) as a surgical intervention .  The patient's history has been reviewed, patient examined, no change in status, stable for surgery.  I have reviewed the patient's chart and labs.  Questions were answered to the patient's satisfaction.     [name]  Stable  VS normal  Procedure reviewed

## 2015-02-13 NOTE — Discharge Instructions (Signed)
Pacemaker Battery Change, Care After Refer to this sheet in the next few weeks. These instructions provide you with information on caring for yourself after your procedure. Your health care provider may also give you more specific instructions. Your treatment has been planned according to current medical practices, but problems sometimes occur. Call your health care provider if you have any problems or questions after your procedure. WHAT TO EXPECT AFTER THE PROCEDURE After your procedure, it is typical to have the following sensations:  Soreness at the pacemaker site. HOME CARE INSTRUCTIONS   Keep the incision clean and dry.  Remove outer dressing tomorrow and leave steri-strips on ; do not get  Site wet for one week.  For the first week after the replacement, avoid stretching motions that pull at the incision site, and avoid heavy exercise with the arm that is on the same side as the incision.  Take medicines only as directed by your health care provider.  Keep all follow-up visits as directed by your health care provider. SEEK MEDICAL CARE IF:   You have pain at the incision site that is not relieved by over-the-counter or prescription medicine.  There is drainage or pus from the incision site.  There is swelling larger than a lime at the incision site.  You develop red streaking that extends above or below the incision site.  You feel brief, intermittent palpitations, light-headedness, or any symptoms that you feel might be related to your heart. SEEK IMMEDIATE MEDICAL CARE IF:   You experience chest pain that is different than the pain at the pacemaker site.  You experience shortness of breath.  You have palpitations or irregular heartbeat.  You have light-headedness that does not go away quickly.  You faint.  You have pain that gets worse and is not relieved by medicine.   This information is not intended to replace advice given to you by your health care provider.  Make sure you discuss any questions you have with your health care provider.   Document Released: 12/14/2012 Document Revised: 07/14/202016 Document Reviewed: 12/14/2012 Elsevier Interactive Patient Education [employer].

## 2015-02-13 NOTE — Interval H&P Note (Signed)
History and Physical Interval Note:  02/13/2015 3:07 PM  [name]  has presented today for surgery, with the diagnosis of end of life generator  The various methods of treatment have been discussed with the patient and family. After consideration of risks, benefits and other options for treatment, the patient has consented to  Procedure(s): PPM Generator Changeout-St. Jude device (N/A) as a surgical intervention .  The patient's history has been reviewed, patient examined, no change in status, stable for surgery.  I have reviewed the patient's chart and labs.  Questions were answered to the patient's satisfaction.     [name]

## 2015-02-14 ENCOUNTER — Encounter (HOSPITAL_COMMUNITY): Payer: Self-pay | Admitting: Internal Medicine

## 2015-02-15 MED FILL — Lidocaine HCl Local Preservative Free (PF) Inj 1%: INTRAMUSCULAR | Qty: 30 | Status: AC

## 2015-02-19 ENCOUNTER — Other Ambulatory Visit: Payer: Self-pay | Admitting: Pharmacist

## 2015-02-19 ENCOUNTER — Encounter: Payer: Self-pay | Admitting: Internal Medicine

## 2015-02-19 ENCOUNTER — Ambulatory Visit (INDEPENDENT_AMBULATORY_CARE_PROVIDER_SITE_OTHER): Payer: Medicare Other | Admitting: Internal Medicine

## 2015-02-19 VITALS — BP 108/60 | HR 93 | Temp 98.4°F | Resp 18 | Ht 65.5 in | Wt 166.0 lb

## 2015-02-19 DIAGNOSIS — E118 Type 2 diabetes mellitus with unspecified complications: Secondary | ICD-10-CM | POA: Diagnosis not present

## 2015-02-19 DIAGNOSIS — F17211 Nicotine dependence, cigarettes, in remission: Secondary | ICD-10-CM | POA: Diagnosis not present

## 2015-02-19 DIAGNOSIS — I6523 Occlusion and stenosis of bilateral carotid arteries: Secondary | ICD-10-CM | POA: Diagnosis not present

## 2015-02-19 DIAGNOSIS — D62 Acute posthemorrhagic anemia: Secondary | ICD-10-CM

## 2015-02-19 DIAGNOSIS — F329 Major depressive disorder, single episode, unspecified: Secondary | ICD-10-CM | POA: Diagnosis not present

## 2015-02-19 DIAGNOSIS — F32A Depression, unspecified: Secondary | ICD-10-CM

## 2015-02-19 MED ORDER — ATENOLOL 25 MG PO TABS: 25.0000 mg | ORAL_TABLET | Freq: Every morning | ORAL | Status: DC

## 2015-02-19 NOTE — Progress Notes (Signed)
Pre visit review using our clinic review tool, if applicable. No additional management support is needed unless otherwise documented below in the visit note. 

## 2015-02-19 NOTE — Patient Instructions (Addendum)
We will have you take metformin only once per day.   Stop taking letrozole when you run out.   Think about trying a medicine to help keep the mood from going up and down so often.   Stress and Stress Management Stress is a normal reaction to life events. It is what you feel when life demands more than you are used to or more than you can handle. Some stress can be useful. For example, the stress reaction can help you catch the last bus of the day, study for a test, or meet a deadline at work. But stress that occurs too often or for too long can cause problems. It can affect your emotional health and interfere with relationships and normal daily activities. Too much stress can weaken your immune system and increase your risk for physical illness. If you already have a medical problem, stress can make it worse. CAUSES  All sorts of life events may cause stress. An event that causes stress for one person may not be stressful for another person. Major life events commonly cause stress. These may be positive or negative. Examples include losing your job, moving into a new home, getting married, having a baby, or losing a loved one. Less obvious life events may also cause stress, especially if they occur day after day or in combination. Examples include working long hours, driving in traffic, caring for children, being in debt, or being in a difficult relationship. SIGNS AND SYMPTOMS Stress may cause emotional symptoms including, the following:  Anxiety. This is feeling worried, afraid, on edge, overwhelmed, or out of control.  Anger. This is feeling irritated or impatient.  Depression. This is feeling sad, down, helpless, or guilty.  Difficulty focusing, remembering, or making decisions. Stress may cause physical symptoms, including the following:   Aches and pains. These may affect your head, neck, back, stomach, or other areas of your body.  Tight muscles or clenched jaw.  Low energy or  trouble sleeping. Stress may cause unhealthy behaviors, including the following:   Eating to feel better (overeating) or skipping meals.  Sleeping too little, too much, or both.  Working too much or putting off tasks (procrastination).  Smoking, drinking alcohol, or using drugs to feel better. DIAGNOSIS  Stress is diagnosed through an assessment by your health care provider. Your health care provider will ask questions about your symptoms and any stressful life events.Your health care provider will also ask about your medical history and may order blood tests or other tests. Certain medical conditions and medicine can cause physical symptoms similar to stress. Mental illness can cause emotional symptoms and unhealthy behaviors similar to stress. Your health care provider may refer you to a mental health professional for further evaluation.  TREATMENT  Stress management is the recommended treatment for stress.The goals of stress management are reducing stressful life events and coping with stress in healthy ways.  Techniques for reducing stressful life events include the following:  Stress identification. Self-monitor for stress and identify what causes stress for you. These skills may help you to avoid some stressful events.  Time management. Set your priorities, keep a calendar of events, and learn to say "no." These tools can help you avoid making too many commitments. Techniques for coping with stress include the following:  Rethinking the problem. Try to think realistically about stressful events rather than ignoring them or overreacting. Try to find the positives in a stressful situation rather than focusing on the negatives.  Exercise. Physical  exercise can release both physical and emotional tension. The key is to find a form of exercise you enjoy and do it regularly.  Relaxation techniques. These relax the body and mind. Examples include yoga, meditation, tai chi, biofeedback,  deep breathing, progressive muscle relaxation, listening to music, being out in nature, journaling, and other hobbies. Again, the key is to find one or more that you enjoy and can do regularly.  Healthy lifestyle. Eat a balanced diet, get plenty of sleep, and do not smoke. Avoid using alcohol or drugs to relax.  Strong support network. Spend time with family, friends, or other people you enjoy being around.Express your feelings and talk things over with someone you trust. Counseling or talktherapy with a mental health professional may be helpful if you are having difficulty managing stress on your own. Medicine is typically not recommended for the treatment of stress.Talk to your health care provider if you think you need medicine for symptoms of stress. HOME CARE INSTRUCTIONS  Keep all follow-up visits as directed by your health care provider.  Take all medicines as directed by your health care provider. SEEK MEDICAL CARE IF:  Your symptoms get worse or you start having new symptoms.  You feel overwhelmed by your problems and can no longer manage them on your own. SEEK IMMEDIATE MEDICAL CARE IF:  You feel like hurting yourself or someone else.   This information is not intended to replace advice given to you by your health care provider. Make sure you discuss any questions you have with your health care provider.   Document Released: 08/19/2000 Document Revised: 08-05-2014 Document Reviewed: 10/18/2012 Elsevier Interactive Patient Education [employer].

## 2015-02-19 NOTE — Patient Outreach (Signed)
[facility]) Care Management  [place]   02/19/2015  CRISTIANA YOCHIM 12-26-1931 [id number]  Subjective: Jazarah Capili is a 81yo who was referred to [facility] for medication review and education.  I made home visit today as scheduled to review patient's medications with her.  Patient had just woken from a nap and reports she is doing fine.  Patient reports she had an appointment with her primary care provider this morning.  Metformin was decreased at primary care today to '500mg'$  once daily.  Patient currently uses a weekly pill box with morning and evening slots.  Pill box is filled by a caregiver that comes to the home three days per week.  Patient reports compliance with medications.  Patient reports she does not understand what all of her medications are for, but she takes them because they are prescribed.  I reviewed pill box and all medications were placed in pill box except acetaminophen and sennosides-docusate which patient takes as needed and amitiza which patient reports she has not started taking yet.  Patient reports she is still taking sennosides-docusate instead of amitiza with good results.  Patient still had metformin in the pill box twice daily.  I offered to take one of the twice daily doses of metformin out of pill box; however, patient reports she will have her caregiver update her pill box when she fills it for next week.   I also noted that pill box only had cilostazole placed in the morning slot instead of twice daily as prescribed.  Patient reports she is only taking the medication once a day per her preference.  Counseled patient on prescribed cilostazol dose.   Patient reports her biggest concern at this time is medication cost.  I called [employer] insurance during the visit and [employer] confirmed that patient is currently in the coverage gap or "donut hole."  She is not eligible for catastrophic coverage at this time as she still has to pay $400 out of  pocket before reaching catastrophic phase.  Patient has enough medications to last her until the end of the year.  Discussed patient assistance options to help with medication cost in the future such as Extra Help or patient assistance programs.  Discussed income requirements for Extra Help and patient assistance programs.  Patient was unsure if she would qualify for the income limits as her and her husband have separate finances and she does not know their overall finances.  Explained that both her and her husbands financial information would be needed to complete the applications.  She states she does not have all of her income information available at this time and requests time to gather the paperwork.     Objective:   Current Medications: Current Outpatient Prescriptions  Medication Sig Dispense Refill  . acetaminophen (TYLENOL) 650 MG CR tablet Take 1,300 mg by mouth every 8 (eight) hours as needed for pain.    Marland Kitchen atenolol (TENORMIN) 25 MG tablet Take 1 tablet (25 mg total) by mouth every morning. 90 tablet 3  . atorvastatin (LIPITOR) 20 MG tablet Take 1 tablet (20 mg total) by mouth daily. 90 tablet 3  . Biotin (BIOTIN 5000) 5 MG CAPS Take 5 mg by mouth at bedtime.     . calcium carbonate (OS-CAL - DOSED IN MG OF ELEMENTAL CALCIUM) 1250 (500 CA) MG tablet Take 1 tablet by mouth daily with breakfast.    . cilostazol (PLETAL) 100 MG tablet Take 100 mg by mouth 2 (  two) times daily.    Marland Kitchen ELIQUIS 5 MG TABS tablet TAKE 1 TABLET TWICE DAILY  (MUST  ESTABLISH  NEW  PRIMARY CARE PROVIDER  FOR FUTURE REFILLS) 180 tablet 3  . Fluticasone-Salmeterol (ADVAIR) 250-50 MCG/DOSE AEPB Inhale 1 puff into the lungs 2 (two) times daily.    Marland Kitchen levalbuterol (XOPENEX HFA) 45 MCG/ACT inhaler Inhale 2 puffs into the lungs every 6 (six) hours as needed. For shortness of breath    . lubiprostone (AMITIZA) 24 MCG capsule Take 1 capsule (24 mcg total) by mouth 2 (two) times daily with a meal. 60 capsule 6  . metFORMIN  (GLUCOPHAGE) 500 MG tablet TAKE 1 TABLET TWICE DAILY WITH A MEAL (Patient taking differently: Taking 1 tablet daily) 180 tablet 3  . Multiple Vitamins-Minerals (MULTIVITAMIN WITH MINERALS) tablet Take 1 tablet by mouth 2 (two) times daily.     . potassium chloride SA (K-DUR,KLOR-CON) 20 MEQ tablet Take 40 mEq by mouth 2 (two) times daily.    Marland Kitchen senna-docusate (SENOKOT-S) 8.6-50 MG per tablet Take 1 tablet by mouth 2 (two) times daily. Stop for diarrhea or loose stools 60 tablet 12  . SPIRIVA HANDIHALER 18 MCG inhalation capsule INHALE THE CONTENTS OF 1 CAPSULE EVERY DAY 90 capsule 3  . torsemide (DEMADEX) 10 MG tablet TAKE 1 TABLET EVERY DAY 90 tablet 1  . ketoconazole (NIZORAL) 2 % cream Apply 1 application topically daily. (Patient not taking: Reported on 02/19/2015) 30 g 6   No current facility-administered medications for this visit.    Functional Status: In your present state of health, do you have any difficulty performing the following activities: 02/13/2015 01/17/2015  Hearing? N Y  Vision? N Y  Difficulty concentrating or making decisions? N N  Walking or climbing stairs? Y Y  Dressing or bathing? Y Y  Doing errands, shopping? - Y  Preparing Food and eating ? - Y  Using the Toilet? - N  In the past six months, have you accidently leaked urine? - N  Do you have problems with loss of bowel control? - N  Managing your Medications? - (No Data)  Managing your Finances? - N  Housekeeping or managing your Housekeeping? - Y    Fall/Depression Screening: PHQ 2/9 Scores 01/17/2015  PHQ - 2 Score 0    Assessment: 1.  Medication assistance:  Patient reports concern regarding medication expenses for brand name medications such as Spiriva, Advair, and Eliquis.  Patient is currently in the donut hole, but she has all medications at this time.  Medication price will return to patient's normal copay in 2017 when patient is out of the donut hole.  Discussed patient assistance options to help  with medication cost in the future such as Extra Help or patient assistance programs.  Spiriva patient assistance program is currently accepting applications for 3762 and does not require patient to have paid a set amount out of pocket prior to approval.  Patient may be eligible for Spiriva assistance.  For Advair patient assistance, patient must spend $600 out of pocket on medication expenses in 2017 before she would be eligible for patient assistance.  Per Eliquis patient assistance program, patient is not eligible since she has Medicare part D.  Patient may be eligible in the future if she goes in the donut hole again.  I discussed income requirements for Extra Help and patient assistance programs.  Patient was unsure if she would qualify for the income limits.  She states she does not have all of  her income paperwork available at this time and requests time to gather the paperwork.    2.  Medication management:   Patient reports she does not have a good understanding of what all of her medications are for.  I reviewed purpose and proper use of all of patients medications with her and wrote down what each medication was for on the medication list that was provided by her primary care office today.  Patient voiced understanding.  Patient is not currently taking amitiza and she is only taking cilostazol once daily per patient preference.  Of note, patient has diastolic heart failure and cilostazol is not indicated in patients with heart failure.  There is evidence of decreased survival rates with phosphodiesterase inhibitors in patients with class III-IV heart failure, but warning is for patients with heart failure of any severity.    Plan: 1.  Patient will take medications as prescribed using weekly pill box filled by her caregiver.  Patient will have caregiver update pill box to reflect metformin dose decrease by primary care provider to '500mg'$  daily.   2.  Medication assistance:  I provided patient with  information on what income documentation is required to complete applications.    Patient asked for me to reach out again in two weeks regarding Extra Help and patient assistance applications.  Will plan a telephone call in 2 weeks and will then schedule a follow up visit if needed to assist patient in filling out Extra Help and Spiriva patient assistance applications. 3.  Will send initial barriers letter to patient's primary care provider to update PCP that patient is not taking amitiza and with recommendation to please continue to reassess use of cilostazol weighing the risks versus benefits.   [facility] CM Care Plan Problem One        Most Recent Value   Care Plan Problem One  Medication assistance   Role Documenting the Problem One  Clinical Pharmacist   Care Plan for Problem One  Active   THN CM Short Term Goal #1 (0-30 days)  Patient will gather financial information required for Extra Help and patient assistance applications and will work on completing applications in the next 30 days.    THN CM Short Term Goal #1 Start Date  02/20/15   Interventions for Short Term Goal #1  I discussed options for patient assistance such as Extra Help and patient assistance programs.  I reviewed application requirements for Extra Help and patient assistance applications. Patient will work on Statistician information for both her and her husband.      [name], Pharm.D. Pharmacy Resident [place] [phone]

## 2015-02-20 ENCOUNTER — Ambulatory Visit (INDEPENDENT_AMBULATORY_CARE_PROVIDER_SITE_OTHER): Payer: Medicare Other | Admitting: *Deleted

## 2015-02-20 DIAGNOSIS — Z950 Presence of cardiac pacemaker: Secondary | ICD-10-CM | POA: Diagnosis not present

## 2015-02-20 DIAGNOSIS — I442 Atrioventricular block, complete: Secondary | ICD-10-CM | POA: Diagnosis not present

## 2015-02-20 DIAGNOSIS — I481 Persistent atrial fibrillation: Secondary | ICD-10-CM | POA: Diagnosis not present

## 2015-02-20 DIAGNOSIS — I4819 Other persistent atrial fibrillation: Secondary | ICD-10-CM

## 2015-02-20 LAB — CUP PACEART INCLINIC DEVICE CHECK
Brady Statistic RV Percent Paced: 99.78 %
Date Time Interrogation Session: 20161214141758
Implantable Lead Implant Date: 20051116
Implantable Lead Location: 753859
Lead Channel Pacing Threshold Amplitude: 0.75 V
Lead Channel Pacing Threshold Pulse Width: 0.5 ms
Lead Channel Setting Pacing Amplitude: 0.875
MDC IDC LEAD IMPLANT DT: 20051116
MDC IDC LEAD LOCATION: 753860
MDC IDC MSMT BATTERY REMAINING LONGEVITY: 134.4
MDC IDC MSMT BATTERY VOLTAGE: 3.05 V
MDC IDC MSMT LEADCHNL RV IMPEDANCE VALUE: 587.5 Ohm
MDC IDC MSMT LEADCHNL RV PACING THRESHOLD AMPLITUDE: 0.75 V
MDC IDC MSMT LEADCHNL RV PACING THRESHOLD PULSEWIDTH: 0.5 ms
MDC IDC SET LEADCHNL RV PACING PULSEWIDTH: 0.5 ms
MDC IDC SET LEADCHNL RV SENSING SENSITIVITY: 5 mV
MDC IDC STAT BRADY RA PERCENT PACED: 0 %
Pulse Gen Model: 2240
Pulse Gen Serial Number: 7838791

## 2015-02-20 NOTE — Progress Notes (Signed)
Wound check appointment s/p generator replacement. Steri-strips removed. Wound without redness or edema. Incision edges approximated, wound well healed. Normal device function. Thresholds, sensing, and impedances consistent with implant measurements. Histogram distribution appropriate for patient and level of activity. No  high ventricular rates noted. Patient educated about wound care, arm mobility, lifting restrictions. Merlin 05/22/15, ROV with SK in 12 months per SK.

## 2015-02-21 ENCOUNTER — Encounter: Payer: Self-pay | Admitting: Pharmacist

## 2015-02-21 NOTE — Assessment & Plan Note (Signed)
Not smoking but is tempted to restart with all the additional stress. Did remind her about the risks to her health if she does restart smoking.

## 2015-02-21 NOTE — Assessment & Plan Note (Signed)
No recurrent bleeding and last CBC stable. No SOB or fatigue symptoms to suggest worsening.

## 2015-02-21 NOTE — Progress Notes (Signed)
   Subjective:    Patient ID: [name], female    DOB: [birth year], 81 y.o.   MRN: [id number]  HPI The patient is an 81 YO female coming in for several concerns. Her son is with her and he has some of the concerns. Her husband's health is declining and this is taking a toll on her. She is doing more around the house. She does resent that she has to do things for him and is not sure she wants to continue doing things for him. They have discussed her going to an ALF but she does not want to spend the money. She is able to do her ADLs. Her mood has been worse recently and she is feeling irritable most of the time. Her son wants her to try counseling or depression medicine. She is not sure about that.   Review of Systems  Constitutional: Positive for activity change. Negative for fever, chills, appetite change, fatigue and unexpected weight change.  Respiratory: Negative for cough, chest tightness, shortness of breath, wheezing and stridor.   Cardiovascular: Negative for chest pain, palpitations and leg swelling.  Gastrointestinal: Negative for nausea, vomiting, diarrhea, constipation, blood in stool and abdominal distention.  Musculoskeletal: Positive for arthralgias and gait problem.  Skin: Positive for rash. Negative for color change and wound.  Neurological: Positive for weakness. Negative for dizziness, tremors, syncope, numbness and headaches.  Psychiatric/Behavioral: Positive for dysphoric mood and decreased concentration. The patient is nervous/anxious.       Objective:   Physical Exam  Constitutional: She appears well-developed and well-nourished.  HENT:  Head: Normocephalic and atraumatic.  Eyes: EOM are normal. Pupils are equal, round, and reactive to light.  Neck: Normal range of motion. Neck supple. No JVD present.  Cardiovascular: Normal rate.   Paced  Pulmonary/Chest: Effort normal and breath sounds normal. No respiratory distress. She has no wheezes. She has no rales. She  exhibits no tenderness.  Abdominal: Soft. Bowel sounds are normal. She exhibits no distension. There is no tenderness. There is no rebound and no guarding.  Neurological: She is alert. No cranial nerve deficit.  Skin: Skin is warm and dry.   Filed Vitals:   02/19/15 1028  BP: 108/60  Pulse: 93  Temp: 98.4 F (36.9 C)  TempSrc: Oral  Resp: 18  Height: 5' 5.5" (1.664 m)  Weight: 166 lb (75.297 kg)  SpO2: 94%      Assessment & Plan:

## 2015-02-21 NOTE — Assessment & Plan Note (Signed)
Decrease metformin to daily given her previous good HgA1c.

## 2015-02-21 NOTE — Assessment & Plan Note (Signed)
Would appear that this is flared up right now and she is having significant stress with her husband and being his primary caregiver. Agreed to fill out FL-2 if needed. Her son has encouraged her to get counseling which she adamantly declines. She also does not want to start any kind of medicine for her mood.

## 2015-02-28 ENCOUNTER — Other Ambulatory Visit: Payer: Self-pay

## 2015-02-28 ENCOUNTER — Telehealth: Payer: Self-pay | Admitting: Internal Medicine

## 2015-02-28 DIAGNOSIS — I5032 Chronic diastolic (congestive) heart failure: Secondary | ICD-10-CM

## 2015-02-28 DIAGNOSIS — E118 Type 2 diabetes mellitus with unspecified complications: Secondary | ICD-10-CM

## 2015-02-28 DIAGNOSIS — M5441 Lumbago with sciatica, right side: Secondary | ICD-10-CM | POA: Diagnosis not present

## 2015-02-28 NOTE — Telephone Encounter (Signed)
Requesting order for PT.  Patient having difficulty with ambulating and has hip pain.  Can fax order to [phone]

## 2015-02-28 NOTE — Patient Outreach (Signed)
[facility]) Care Management  02/28/2015  [name] 1931/03/18 [id number]   This RNCM contacted patient to remind her of today's appointment. Patient identified herself using HIPPA identifiers by giving her date of birth and address.     Patient requested home visit be rescheduled for after the new year, as she is preparing for her daughter to come in from [place] with her grandchild, she states she wants to spend as much time as possible with her daughter and grandchild. patient asked to be called after the new year to schedule appointments with her and her husband.  Plan: March 13, 2015 for telephone contact and to schedule home visit.

## 2015-03-05 ENCOUNTER — Other Ambulatory Visit: Payer: Self-pay | Admitting: Pharmacist

## 2015-03-05 ENCOUNTER — Telehealth: Payer: Self-pay | Admitting: Internal Medicine

## 2015-03-05 DIAGNOSIS — M069 Rheumatoid arthritis, unspecified: Secondary | ICD-10-CM

## 2015-03-05 NOTE — Telephone Encounter (Signed)
Placed another referral.

## 2015-03-05 NOTE — Telephone Encounter (Signed)
Order placed in computer.

## 2015-03-05 NOTE — Patient Outreach (Signed)
Entered in error

## 2015-03-05 NOTE — Patient Outreach (Signed)
[facility]) Care Management  [place]   03/05/2015  [name] [birth year] [id number]   [name] is a 81 yo who I am following for medication assistance.  I made outreach call to patient as scheduled to follow up.  Patient reports she has not been able to gather her financial information yet.  She reports she still has family in town for the holidays and requested a call back again in one week.  Patient reports she has all her medications at this time.  I will reach out to patient on Tuesday, 03/11/14.     [name], Pharm.D. Pharmacy Resident [place] [phone]

## 2015-03-05 NOTE — Telephone Encounter (Signed)
Patient was referred to Dr. Gladstone Lighter at [facility] orthopedic for rheumatoid arthritis.  Dr. Gladstone Lighter notified patient that she had a dislocated vertebra due to the arthritis.  Patient and daughter could not understand provider or what the options were.  States he did not have a good bed side manor.  Would like to be referred to someone else.

## 2015-03-12 ENCOUNTER — Other Ambulatory Visit: Payer: Self-pay | Admitting: Pharmacist

## 2015-03-12 DIAGNOSIS — Z471 Aftercare following joint replacement surgery: Secondary | ICD-10-CM | POA: Diagnosis not present

## 2015-03-12 DIAGNOSIS — M1652 Unilateral post-traumatic osteoarthritis, left hip: Secondary | ICD-10-CM | POA: Diagnosis not present

## 2015-03-12 DIAGNOSIS — M5441 Lumbago with sciatica, right side: Secondary | ICD-10-CM | POA: Diagnosis not present

## 2015-03-12 DIAGNOSIS — Z96642 Presence of left artificial hip joint: Secondary | ICD-10-CM | POA: Diagnosis not present

## 2015-03-12 NOTE — Patient Outreach (Signed)
[facility]) Care Management  03/12/2015  [name] Jul 19, 1931 [id number]   [name] is a 82 yo who I am following for medication assistance. I made outreach call to patient to follow up. Patient reports she has not been able to gather her financial information yet. Patient seems slightly confused on what information she needs to be gathering and why information is needed.  I reminded patient the information is needed to complete Extra Help or patient assistance applications for her medications.  I explained to patient what information is needed.  Patient states she will look for financial information today.  Patient reports she has all her medications at this time. Patient asked for me to reach out to her again tomorrow.  I will reach out to patient on Wednesday, 03/12/14.    [name], Pharm.D. Pharmacy Resident [place] [phone]

## 2015-03-13 ENCOUNTER — Other Ambulatory Visit: Payer: Self-pay | Admitting: Pharmacist

## 2015-03-13 NOTE — Patient Outreach (Signed)
[facility]) Care Management  03/13/2015  NEESHA LANGTON 1932-01-23 [id number]   Don Giarrusso is a 82 yo who I am following for medication assistance. I made outreach call to patient today as requested. Patient reports she still has not been able to gather her financial information yet.  She reports her children are helping her get the financial information together.  Patient and husband have separate finances.  She reports her children have the information for her husband ready but not her. I offered to reach out to one of patient's children to get their assistance with completing applications.  However, patient reports she will call me once she has information and once she is ready to complete Extra Help and patient assistance applications.  Patient reports she has all her medications at this time. I counseled patient to continue to take medications as prescribed.  I provided patient with my name and phone number and encouraged her to call me when she is ready to complete applications.  Will close pharmacy program at this time, but will remain available to assist patient with Extra Help and patient assisatnce forms when she is ready.   I will update [facility] CMRN Loni Muse.   [name], Pharm.D. Pharmacy Resident [place] [phone]

## 2015-03-16 ENCOUNTER — Other Ambulatory Visit: Payer: Self-pay | Admitting: Pharmacist

## 2015-03-16 NOTE — Patient Outreach (Signed)
[facility]) Care Management  03/16/2015  [name] 11-09-1931 [id number]   I received a phone call from patient [name] on 03/15/15 at 5:00 PM.  Patient asked if we had appointment scheduled for 4:00 PM that day.  I informed [name] that we did not have an appointment scheduled.  Per our last conversation I told patient the plan was for her to call me when she had the information on her annual income and was ready to submit paperwork for Extra Help and patient assistance programs.  Patient voiced understanding and reports she is still gathering information.  Patient reports she will call me when she has information.     [name], Pharm.D. Pharmacy Resident [place] [phone]

## 2015-03-19 ENCOUNTER — Other Ambulatory Visit: Payer: Self-pay | Admitting: Critical Care Medicine

## 2015-03-19 ENCOUNTER — Telehealth: Payer: Self-pay | Admitting: Pulmonary Disease

## 2015-03-19 MED ORDER — FLUTICASONE-SALMETEROL 250-50 MCG/DOSE IN AEPB: 1.0000 | INHALATION_SPRAY | Freq: Two times a day (BID) | RESPIRATORY_TRACT | Status: DC

## 2015-03-19 MED ORDER — TIOTROPIUM BROMIDE MONOHYDRATE 18 MCG IN CAPS: ORAL_CAPSULE | RESPIRATORY_TRACT | Status: DC

## 2015-03-19 NOTE — Telephone Encounter (Signed)
Spoke with pt. Needs refills on Spiriva and Advair sent to [facility]. These have been sent in. Advised her that we do not have samples at this time. Nothing further was needed.

## 2015-03-21 ENCOUNTER — Telehealth: Payer: Self-pay | Admitting: Internal Medicine

## 2015-03-21 NOTE — Telephone Encounter (Signed)
[name] [phone] ext [phone]  Need PT order, patient is having trouble ambulating and hip pain .  Fax order and last office to [phone]

## 2015-03-22 NOTE — Telephone Encounter (Signed)
Called and spoke to [facility] and gave verbal orders for PT.

## 2015-03-25 ENCOUNTER — Other Ambulatory Visit: Payer: Self-pay

## 2015-03-25 ENCOUNTER — Encounter: Payer: Self-pay | Admitting: Internal Medicine

## 2015-03-26 ENCOUNTER — Other Ambulatory Visit: Payer: Self-pay | Admitting: Pharmacist

## 2015-03-26 NOTE — Patient Outreach (Signed)
[place] [facility]) Care Management  03/26/2015  [name] September 08, 1931 [id number]   [name] is a 82yo who was referred to [place] for medication assistance.  I received notification from [place] that patient's daughter is in town and has financial information ready to apply for Extra Help and patient assistance program for patient's medications.  I made outreach call to patient but there was no answer.  I left a HIPAA complaint voicemail for patient to return my phone call.  Will reach out to patient again within one week if she does not return my phone call.     [name], Pharm.D. Pharmacy Resident [place] [phone]

## 2015-03-27 ENCOUNTER — Telehealth: Payer: Self-pay | Admitting: Internal Medicine

## 2015-03-27 DIAGNOSIS — Z96642 Presence of left artificial hip joint: Secondary | ICD-10-CM | POA: Diagnosis not present

## 2015-03-27 DIAGNOSIS — M5441 Lumbago with sciatica, right side: Secondary | ICD-10-CM | POA: Diagnosis not present

## 2015-03-27 DIAGNOSIS — Z7901 Long term (current) use of anticoagulants: Secondary | ICD-10-CM | POA: Diagnosis not present

## 2015-03-27 DIAGNOSIS — Z950 Presence of cardiac pacemaker: Secondary | ICD-10-CM | POA: Diagnosis not present

## 2015-03-27 DIAGNOSIS — M4316 Spondylolisthesis, lumbar region: Secondary | ICD-10-CM | POA: Diagnosis not present

## 2015-03-27 DIAGNOSIS — E119 Type 2 diabetes mellitus without complications: Secondary | ICD-10-CM | POA: Diagnosis not present

## 2015-03-27 NOTE — Telephone Encounter (Signed)
[facility] called to advise that the patient refused multiple attempts to see them before today. She states that the patient finally agreed today to see them, so the plan of care begins on todays date

## 2015-03-28 NOTE — Telephone Encounter (Signed)
fine

## 2015-03-29 ENCOUNTER — Other Ambulatory Visit: Payer: Self-pay | Admitting: Pharmacist

## 2015-03-29 ENCOUNTER — Telehealth: Payer: Self-pay | Admitting: Internal Medicine

## 2015-03-29 NOTE — Telephone Encounter (Signed)
Please call Mechele Claude with [facility] at [phone]

## 2015-03-29 NOTE — Patient Outreach (Signed)
[facility]) Care Management  03/29/2015  [name] 1931-04-25 [id number]   [name] is a 82yo who was referred to [place] for medication assistance. I received notification from [place] that patient's daughter is in town and has financial information ready to apply for Extra Help and patient assistance program for patient's medications. I made outreach call to patient who reports her daughter [name] has the financial information but she has already left town.  [name] provided me with [name]'s phone number [phone]) and gave me permission to reach out to her.  I called patient's daughter [name] but there was no answer. I left a HIPAA complaint voicemail for [name] to return my phone call.  I called [name] to update her and informed [name] I will try to reach out to [facility] again next week if she does not return my call.  Patient voiced understanding.  [name] confirms she has all medications at this time and is now out of the donut hole but is still interested in applying for Extra Help to reduce her medication cost. I will reach out to patient's daughter again within one week if she does not return my phone call.   [name], Pharm.D. Pharmacy Resident [place] [phone]

## 2015-03-29 NOTE — Telephone Encounter (Signed)
Dr. Cay Schillings will be overseeing this patient's PT.

## 2015-04-01 ENCOUNTER — Other Ambulatory Visit: Payer: Self-pay | Admitting: Pharmacist

## 2015-04-01 NOTE — Patient Outreach (Signed)
[facility]) Care Management  04/01/2015  OWEN PAGNOTTA 19-Mar-1931 [id number]   [name] is a 82yo who was referred to [place] for medication assistance.  I made an outreach call to patient's daughter Grier Czerwinski (phone number: [phone]) regarding financial information for Extra Help and patient assistance application for Spiriva.  Patient's daughter did not have much time to talk today, but I provided her with the income cut offs to qualify for Extra Help and the Spiriva patient assistance program.  Zigmund Daniel reports her mother would not qualify for Extra Help due to liquid assets.  Zigmund Daniel reports she will review the patient's tax information tonight to determine if patient would qualify for Spiriva patient assistance program and requested a return call tomorrow.    Plan:  I will reach out to patient's daughter Kaleiyah Polsky on 04/02/15 regarding Spiriva patient assistance application.     [name], Pharm.D. Pharmacy Resident [place] [phone]

## 2015-04-02 ENCOUNTER — Other Ambulatory Visit: Payer: Self-pay | Admitting: Pharmacist

## 2015-04-02 DIAGNOSIS — M5441 Lumbago with sciatica, right side: Secondary | ICD-10-CM | POA: Diagnosis not present

## 2015-04-02 DIAGNOSIS — Z96642 Presence of left artificial hip joint: Secondary | ICD-10-CM | POA: Diagnosis not present

## 2015-04-02 DIAGNOSIS — M4316 Spondylolisthesis, lumbar region: Secondary | ICD-10-CM | POA: Diagnosis not present

## 2015-04-02 DIAGNOSIS — Z950 Presence of cardiac pacemaker: Secondary | ICD-10-CM | POA: Diagnosis not present

## 2015-04-02 DIAGNOSIS — Z7901 Long term (current) use of anticoagulants: Secondary | ICD-10-CM | POA: Diagnosis not present

## 2015-04-02 DIAGNOSIS — E119 Type 2 diabetes mellitus without complications: Secondary | ICD-10-CM | POA: Diagnosis not present

## 2015-04-02 NOTE — Patient Outreach (Signed)
[place] [facility]) Care Management  04/02/2015  [name] March 03, 1932 [id number]   [name] is a 82yo who was referred to [place] for medication assistance. I made an outreach call to patient's daughter [name] (phone number: [phone]) today as scheduled regarding financial information for Extra Help and patient assistance application for Spiriva. Patient's daughter reports she has not had time to look at the tax information to determine if patient would qualify for Spiriva patient assistance program.  She quested a return call tomorrow.    Plan: I will reach out to patient's daughter [name] on 04/03/15 regarding Spiriva patient assistance application.   [name], Pharm.D. Pharmacy Resident [place] [phone]

## 2015-04-02 NOTE — Patient Outreach (Signed)
Unsuccessful attempt made to contact patient via telephone.  Will make an attempt to contact patient on April 10, 2015

## 2015-04-03 ENCOUNTER — Other Ambulatory Visit: Payer: Self-pay | Admitting: Pharmacist

## 2015-04-03 NOTE — Patient Outreach (Signed)
[facility]) Care Management  04/03/2015  EMILENE ROMA [birth year] [id number]   [name] is a 82yo who was referred to [place] for medication assistance. I made an outreach call to patient's daughter [name] (phone number: [phone]) today as scheduled regarding financial information for Extra Help and patient assistance application for Spiriva. There was no answer.  I left a HIPAA compliant voicemail for patient's daughter to return my call.    Plan: I will reach out to patient's daughter [name] within one week regarding Spiriva patient assistance application if she does not return my call.   [name], Pharm.D. Pharmacy Resident [place] [phone]

## 2015-04-04 ENCOUNTER — Other Ambulatory Visit: Payer: Self-pay | Admitting: Pharmacist

## 2015-04-04 DIAGNOSIS — M4316 Spondylolisthesis, lumbar region: Secondary | ICD-10-CM | POA: Diagnosis not present

## 2015-04-04 DIAGNOSIS — Z7901 Long term (current) use of anticoagulants: Secondary | ICD-10-CM | POA: Diagnosis not present

## 2015-04-04 DIAGNOSIS — Z950 Presence of cardiac pacemaker: Secondary | ICD-10-CM | POA: Diagnosis not present

## 2015-04-04 DIAGNOSIS — Z96642 Presence of left artificial hip joint: Secondary | ICD-10-CM | POA: Diagnosis not present

## 2015-04-04 DIAGNOSIS — E119 Type 2 diabetes mellitus without complications: Secondary | ICD-10-CM | POA: Diagnosis not present

## 2015-04-04 DIAGNOSIS — M5441 Lumbago with sciatica, right side: Secondary | ICD-10-CM | POA: Diagnosis not present

## 2015-04-05 NOTE — Patient Outreach (Signed)
[facility]) Care Management  04/05/2015  [name] 08-17-1931 [id number]   [name] is a 82yo who was referred to [place] for medication assistance.  I received a return phone call from patient's daughter Jakaya Jacobowitz regarding financial information for the Spiriva patient assistance program.  Zigmund Daniel confirmed that her mother would not qualify for the Spiriva patient assistance program based on gross monthly income from pension/retirement.  I called Jennylee Rosenwald to update her on my conversations with her daughter, Ronica Vivian.  I informed the patient that I reviewed the income cut offs to qualify for Extra Help and Spiriva patient assistance program with her daughter, her daughter reviewed the patient's financial information and determine that patient would not qualify for either Extra Help or the patient assistance program.  Patient voices understanding and thanked me for my help.  Patient confirms that she has all her medications and she is able to afford them at this time.  She denies any further pharmacy needs.  Provided patient with my phone number and encouraged her to call me in the future if she has any questions or concerns regarding her medications.  Will close pharmacy program at this time.  I will update [facility] CMRN Loni Muse.     [name], Pharm.D. Pharmacy Resident [place] [phone]

## 2015-04-09 ENCOUNTER — Other Ambulatory Visit: Payer: Self-pay

## 2015-04-10 ENCOUNTER — Other Ambulatory Visit: Payer: Self-pay | Admitting: Orthopedic Surgery

## 2015-04-10 DIAGNOSIS — M5441 Lumbago with sciatica, right side: Principal | ICD-10-CM

## 2015-04-10 DIAGNOSIS — G8929 Other chronic pain: Secondary | ICD-10-CM

## 2015-04-12 DIAGNOSIS — M4316 Spondylolisthesis, lumbar region: Secondary | ICD-10-CM | POA: Diagnosis not present

## 2015-04-12 DIAGNOSIS — E119 Type 2 diabetes mellitus without complications: Secondary | ICD-10-CM | POA: Diagnosis not present

## 2015-04-12 DIAGNOSIS — Z96642 Presence of left artificial hip joint: Secondary | ICD-10-CM | POA: Diagnosis not present

## 2015-04-12 DIAGNOSIS — M5441 Lumbago with sciatica, right side: Secondary | ICD-10-CM | POA: Diagnosis not present

## 2015-04-12 DIAGNOSIS — Z7901 Long term (current) use of anticoagulants: Secondary | ICD-10-CM | POA: Diagnosis not present

## 2015-04-12 DIAGNOSIS — Z950 Presence of cardiac pacemaker: Secondary | ICD-10-CM | POA: Diagnosis not present

## 2015-04-17 ENCOUNTER — Telehealth: Payer: Self-pay | Admitting: Internal Medicine

## 2015-04-17 NOTE — Telephone Encounter (Signed)
Calling about a clearance

## 2015-04-17 NOTE — Telephone Encounter (Signed)
Faxed clearance to [facility].

## 2015-04-25 ENCOUNTER — Telehealth: Payer: Self-pay

## 2015-04-25 NOTE — Telephone Encounter (Signed)
Paperwork for admittance to [facility] received, signed and faxed Vanessa Kick [phone]. Pt's daughter Zigmund Daniel advised of same. Copy to scan, original in cabinet for pick up

## 2015-05-03 DIAGNOSIS — M5441 Lumbago with sciatica, right side: Secondary | ICD-10-CM | POA: Diagnosis not present

## 2015-05-16 ENCOUNTER — Ambulatory Visit (INDEPENDENT_AMBULATORY_CARE_PROVIDER_SITE_OTHER): Payer: Medicare Other | Admitting: Podiatry

## 2015-05-16 ENCOUNTER — Encounter: Payer: Self-pay | Admitting: Podiatry

## 2015-05-16 DIAGNOSIS — M79676 Pain in unspecified toe(s): Secondary | ICD-10-CM

## 2015-05-16 DIAGNOSIS — Q828 Other specified congenital malformations of skin: Secondary | ICD-10-CM | POA: Diagnosis not present

## 2015-05-16 DIAGNOSIS — B351 Tinea unguium: Secondary | ICD-10-CM

## 2015-05-19 NOTE — Progress Notes (Signed)
She presents today with chief complaint of painful elongated toenails and a painful corn to the fourth toe right foot.  Objective: Vital signs are stable she is alert and oriented 3. Pulses are palpable. Neurologic sensorium is intact per Semmes-Weinstein monofilament. Deep tendon reflexes are intact bilateral and muscle strength +5 over 5 dorsiflexion plantar flexors and inverters everters on physical musculature is intact. Orthopedic evaluation images onto Asst. Lingle full range of motion without crepitation. Cutaneous evaluation was traced thick yellow dystrophic with mycotic nails bilaterally. These are painful on palpation as well as debridement. Reactive hyperkeratotic lesion plantar aspect fourth toe right foot.  Assessment: Pain in limb secondary to onychomycosis and porokeratotic lesion.  Plan: Debridement of all reactive hyperkeratotic tissues and toenails 1 through 5 bilateral. Follow up with her in 3 months

## 2015-05-20 ENCOUNTER — Ambulatory Visit (INDEPENDENT_AMBULATORY_CARE_PROVIDER_SITE_OTHER): Payer: Medicare Other | Admitting: Internal Medicine

## 2015-05-20 ENCOUNTER — Encounter: Payer: Self-pay | Admitting: Internal Medicine

## 2015-05-20 VITALS — BP 90/60 | HR 76 | Temp 97.8°F | Resp 16 | Ht 65.5 in | Wt 169.1 lb

## 2015-05-20 DIAGNOSIS — R251 Tremor, unspecified: Secondary | ICD-10-CM | POA: Diagnosis not present

## 2015-05-20 DIAGNOSIS — D62 Acute posthemorrhagic anemia: Secondary | ICD-10-CM | POA: Diagnosis not present

## 2015-05-20 DIAGNOSIS — M1612 Unilateral primary osteoarthritis, left hip: Secondary | ICD-10-CM | POA: Diagnosis not present

## 2015-05-20 DIAGNOSIS — E118 Type 2 diabetes mellitus with unspecified complications: Secondary | ICD-10-CM | POA: Diagnosis not present

## 2015-05-20 DIAGNOSIS — J449 Chronic obstructive pulmonary disease, unspecified: Secondary | ICD-10-CM

## 2015-05-20 MED ORDER — LINACLOTIDE 145 MCG PO CAPS: 145.0000 ug | ORAL_CAPSULE | Freq: Every day | ORAL | Status: DC

## 2015-05-20 NOTE — Patient Instructions (Signed)
It is okay to take the atenolol twice a day to see if this helps with your tremors more.   If you start feeling dizzy or lightheaded or you might pass out call the office and go back to once a day on the atenolol.  We have sent in linzess to replace the amitiza for the stomach. This is a once a day medicine for the bowels to help you go.

## 2015-05-20 NOTE — Progress Notes (Signed)
   Subjective:    Patient ID: [name], female    DOB: 1931/10/11, 82 y.o.   MRN: [id number]  HPI The patient is an 82 YO female coming in for follow up of her medical problems including her anemia (no new bleeding or black stools, no SOB but some mild fatigue, taking her vitamins daily), her arthritis (worsening in her arms, still present in her legs as well, taking otc tylenol), and new problem of tremor (she has not noticed except the past several months, no changes to her meds, no change to diet or exercise, she does have tremors in her history, worse lately and taking atenolol once a day). She does not want to change her medicines but she wants her problems to be gone. We discussed that we can make some things better with medicines but we cannot get rid of the problems likely.   Review of Systems  Constitutional: Positive for activity change and fatigue. Negative for fever, chills, appetite change and unexpected weight change.  HENT: Negative.   Eyes: Negative.   Respiratory: Negative for cough, chest tightness, shortness of breath, wheezing and stridor.   Cardiovascular: Negative for chest pain, palpitations and leg swelling.  Gastrointestinal: Negative for nausea, vomiting, diarrhea, constipation, blood in stool and abdominal distention.  Musculoskeletal: Positive for arthralgias and gait problem.  Skin: Positive for rash. Negative for color change and wound.  Neurological: Positive for tremors and weakness. Negative for dizziness, syncope, numbness and headaches.  Psychiatric/Behavioral: Positive for dysphoric mood.      Objective:   Physical Exam  Constitutional: She appears well-developed and well-nourished.  HENT:  Head: Normocephalic and atraumatic.  Eyes: EOM are normal. Pupils are equal, round, and reactive to light.  Neck: Normal range of motion. Neck supple. No JVD present.  Cardiovascular: Normal rate.   Paced  Pulmonary/Chest: Effort normal and breath sounds  normal. No respiratory distress. She has no wheezes. She has no rales. She exhibits no tenderness.  Abdominal: Soft. Bowel sounds are normal. She exhibits no distension. There is no tenderness. There is no rebound and no guarding.  Neurological: She is alert. No cranial nerve deficit.  No tremor noted on exam at rest  Skin: Skin is warm and dry.   Filed Vitals:   05/20/15 1328  BP: 90/60  Pulse: 76  Temp: 97.8 F (36.6 C)  TempSrc: Oral  Resp: 16  Height: 5' 5.5" (1.664 m)  Weight: 169 lb 1.9 oz (76.712 kg)  SpO2: 94%      Assessment & Plan:

## 2015-05-21 DIAGNOSIS — R251 Tremor, unspecified: Secondary | ICD-10-CM | POA: Insufficient documentation

## 2015-05-21 NOTE — Assessment & Plan Note (Signed)
She is on atenolol and taking in the morning. Have advised that she can increase to BID if she wants and initially she is resistant to more medicines but then informed that the tremor would likely not change if her medicines were not changed she is willing to try a second dose. I have asked her to stop the torsemide since her BP is low and we are increasing the atenolol but she declines. She will let us know if she is having any lightheadedness, or dizziness or pre-syncope.

## 2015-05-21 NOTE — Assessment & Plan Note (Signed)
No exacerbation right now. Continue advair, xopenex, spiriva.

## 2015-05-21 NOTE — Assessment & Plan Note (Signed)
Checking CBC today to follow for improvement or worsening.

## 2015-05-21 NOTE — Assessment & Plan Note (Signed)
She is taking some tylenol but only at night. She is okay to take in the morning as well and talked to her about the safe dose limits.

## 2015-05-21 NOTE — Assessment & Plan Note (Signed)
On metformin and checking HgA1c today, adjust as needed. No on ACE-I or ARB right now due to recent volume depletion. She does not want to change her medicines today. BP low normal.

## 2015-05-22 ENCOUNTER — Ambulatory Visit (INDEPENDENT_AMBULATORY_CARE_PROVIDER_SITE_OTHER): Payer: Medicare Other | Admitting: *Deleted

## 2015-05-22 DIAGNOSIS — I495 Sick sinus syndrome: Secondary | ICD-10-CM | POA: Diagnosis not present

## 2015-05-22 DIAGNOSIS — Z950 Presence of cardiac pacemaker: Secondary | ICD-10-CM | POA: Diagnosis not present

## 2015-05-22 NOTE — Progress Notes (Signed)
Remote pacemaker transmission.   

## 2015-05-23 DIAGNOSIS — Z950 Presence of cardiac pacemaker: Secondary | ICD-10-CM | POA: Diagnosis not present

## 2015-06-24 LAB — CUP PACEART REMOTE DEVICE CHECK
Implantable Lead Implant Date: 20051116
Implantable Lead Location: 753859
Lead Channel Pacing Threshold Amplitude: 0.625 V
Lead Channel Pacing Threshold Pulse Width: 0.5 ms
Lead Channel Setting Pacing Amplitude: 0.875
MDC IDC LEAD IMPLANT DT: 20051116
MDC IDC LEAD LOCATION: 753860
MDC IDC MSMT LEADCHNL RV IMPEDANCE VALUE: 580 Ohm
MDC IDC PG SERIAL: 7838791
MDC IDC SESS DTM: 20170417120018
MDC IDC SET LEADCHNL RV PACING PULSEWIDTH: 0.5 ms
MDC IDC SET LEADCHNL RV SENSING SENSITIVITY: 5 mV
MDC IDC STAT BRADY RV PERCENT PACED: 99 %
Pulse Gen Model: 2240

## 2015-06-25 ENCOUNTER — Encounter: Payer: Self-pay | Admitting: Cardiology

## 2015-07-09 ENCOUNTER — Encounter: Payer: Self-pay | Admitting: Cardiology

## 2015-07-12 ENCOUNTER — Ambulatory Visit (INDEPENDENT_AMBULATORY_CARE_PROVIDER_SITE_OTHER): Payer: Medicare Other | Admitting: Pulmonary Disease

## 2015-07-12 ENCOUNTER — Telehealth: Payer: Self-pay | Admitting: Internal Medicine

## 2015-07-12 ENCOUNTER — Encounter: Payer: Self-pay | Admitting: Pulmonary Disease

## 2015-07-12 VITALS — BP 118/68 | HR 81 | Ht 66.0 in | Wt 170.0 lb

## 2015-07-12 DIAGNOSIS — Z853 Personal history of malignant neoplasm of breast: Secondary | ICD-10-CM | POA: Diagnosis not present

## 2015-07-12 DIAGNOSIS — I5042 Chronic combined systolic (congestive) and diastolic (congestive) heart failure: Secondary | ICD-10-CM | POA: Diagnosis not present

## 2015-07-12 DIAGNOSIS — N2581 Secondary hyperparathyroidism of renal origin: Secondary | ICD-10-CM | POA: Diagnosis not present

## 2015-07-12 DIAGNOSIS — E1129 Type 2 diabetes mellitus with other diabetic kidney complication: Secondary | ICD-10-CM | POA: Diagnosis not present

## 2015-07-12 DIAGNOSIS — I480 Paroxysmal atrial fibrillation: Secondary | ICD-10-CM | POA: Diagnosis not present

## 2015-07-12 DIAGNOSIS — J449 Chronic obstructive pulmonary disease, unspecified: Secondary | ICD-10-CM

## 2015-07-12 DIAGNOSIS — E782 Mixed hyperlipidemia: Secondary | ICD-10-CM | POA: Diagnosis not present

## 2015-07-12 DIAGNOSIS — N183 Chronic kidney disease, stage 3 (moderate): Secondary | ICD-10-CM | POA: Diagnosis not present

## 2015-07-12 DIAGNOSIS — I739 Peripheral vascular disease, unspecified: Secondary | ICD-10-CM | POA: Diagnosis not present

## 2015-07-12 DIAGNOSIS — I129 Hypertensive chronic kidney disease with stage 1 through stage 4 chronic kidney disease, or unspecified chronic kidney disease: Secondary | ICD-10-CM | POA: Diagnosis not present

## 2015-07-12 DIAGNOSIS — J439 Emphysema, unspecified: Secondary | ICD-10-CM | POA: Diagnosis not present

## 2015-07-12 DIAGNOSIS — G4733 Obstructive sleep apnea (adult) (pediatric): Secondary | ICD-10-CM | POA: Diagnosis not present

## 2015-07-12 DIAGNOSIS — Z950 Presence of cardiac pacemaker: Secondary | ICD-10-CM | POA: Diagnosis not present

## 2015-07-12 DIAGNOSIS — D631 Anemia in chronic kidney disease: Secondary | ICD-10-CM | POA: Diagnosis not present

## 2015-07-12 NOTE — Telephone Encounter (Signed)
Page: 1 of 1 Call Id: [id number] [place] Patient Name: [name] DOB: 1931-09-20 Initial Comment [name] says she saw a kidney doctor this AM. Her sugar level was 205 this AM. Does not feel bad Nurse Assessment Nurse: Germain Osgood, RN, Opal Sidles Date/Time Eilene Ghazi Time): 07/12/2015 4:19:38 PM Confirm and document reason for call. If symptomatic, describe symptoms. You must click the next button to save text entered. ---[name] states her Blood sugar was 205 this A.M. States she ate a bag of candy at lunch time after blood sugar drawn. Has the patient traveled out of the country within the last 30 days? ---No Does the patient have any new or worsening symptoms? ---Yes Will a triage be completed? ---Yes Related visit to physician within the last 2 weeks? ---Yes Does the PT have any chronic conditions? (i.e. diabetes, asthma, etc.) ---Yes List chronic conditions. ---82 Is this a behavioral health or substance abuse call? ---No Guidelines Guideline Title Affirmed Question Affirmed Notes Diabetes - High Blood Sugar Blood glucose 60-240 mg/dl (3.5 -13 mmol/ l) (all triage questions negative) Final Disposition User Arthur, RN, Opal Sidles Disagree/Comply

## 2015-07-12 NOTE — Patient Instructions (Signed)
Continue using yes Spiriva and Advair inhaler. We'll call in a prescription for these. We'll also call in a prescription for Xopenex nebs to be used as needed.  Return to clinic in 6 months.

## 2015-07-12 NOTE — Progress Notes (Signed)
Subjective:    Patient ID: [name], female    DOB: October 02, 1931, 82 y.o.   MRN: [id number]  PROBLEM LIST COPD Heavy ex smoker OSA on CPAP with 2Lt O2  HPI [name] has history of COPD with frequent exacerbations in the past. She is a former patient of [name] and a heavy former smoker. She smoked one and half to 3 packs per day for nearly 60 years. She quit 2013. She is actually improved after quitting in terms of her respiratory status. At last visit she was given doxy and steroids for an exacerbation. She feels well now and back to baseline.  She underwent right hip and total knee replacement in the past. She has apparently tolerated these procedures well. She has history of atrial fibrillation and failed several ablations. She has a permanent pacemaker. 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 She is not on amiodarone at present.  Past Medical History  Diagnosis Date  . 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 Chronic renal insufficiency, stage III (moderate)     CrCl about 60 ml/min  . Diabetes ([place])   . Tobacco abuse   . Diastolic heart failure     Acute on Chronic  . Pacemaker     Permanent  . AF (atrial fibrillation) ([place])      AV ablation 9/09 [place] per Dr Ola Spurr - AV node ablation 9/11 Dr Caryl Comes  . Hyperlipidemia   . GERD (gastroesophageal reflux disease)   . [name] (coronary artery disease)     (not sure of this 11/10  . COPD (chronic obstructive pulmonary disease) (HCC)     emphysema -FeV1 73% DLCO 53% 5/09  . Invasive ductal carcinoma of breast ([place]) 2011    LEFT   . OA (osteoarthritis) of knee     RIGHT  . Right sided sciatica   . Sinoatrial node dysfunction (HCC)   . CHF (congestive heart failure) ([place])   . Mental disorder   . Acute blood loss anemia 04/20/2012  . Depression 06/06/2012  . Hx of cardiovascular stress test     Lexiscan Myoview  (09/2013):  No definite ischemia, EF 67%; low risk  . PAD (peripheral artery disease) (HCC)     w/hx right iliac/SFA stenting and left and rt leg PTA  . CVA (cerebral vascular accident) ([place])     no residual effects evident to pt   . History of skin cancer   . Eczema   . Complication of anesthesia     "psycotic episode" after hip surg - resolved  . Sleep apnea     associated with hypersomnia uses O2 2L/M at night and during naps also uses CPAP  . Shortness of breath dyspnea     walking distances   . Pneumonia     hx of   . Asthma   . Bronchitis     hx of   . Pain     pt states has pain in fingertips per right hand pt states has been told may be carpal tunnel  . Tremors of nervous system     hands bilat     Review of Systems  C/O dyspnea on exertion, cough, sputum production, wheezing. Denies any chest pain, palpitations. Denies any nausea, vomiting, diarrhea, constipation. No malaise, weight loss, fatigue. All other review of systems are negative.  CXR (10/24/14) No active cardiopulmonary disease.  PFTs (02/20/10) FVC 1.66 L/S (56%) FEV1 1.289 L/S (59%) F/F 74     Objective:   Physical Exam Blood pressure 118/68, pulse 81, height '5\' 6"'$  (1.676 m), weight 170 lb (77.111 kg), SpO2 96 %. Gen.: Pleasant elderly female. No apparent distress Neuro: No gross focal deficits. Neck: No JVD, lymphadenopathy, thyromegaly. RS: Clear antr. No wheeze or crackles.  CVS: S1-S2 heard, no murmurs rubs gallops. Abdomen: Soft, positive bowel sounds. Extremities: No edema    Assessment & Plan:  COPD. She is doing well with no issues. Stable on spiriva and advair.  She will continue her current inhalers. I do not have any recent PFTs, spirometry to take a look at. She will get a set of PFTs at the time of her next visit. There is a question of history of amiodarone pulmonary toxicity. I will reassess this after getting her PFTs. Her lung imaging is clear.  Heavy ex smoker. She quit 3  years ago. She misses her cigarettes but is able to keep off them. She will not qualify for a screening CT program of the chest given her age.  OSA She is compliant with his CPAP with no issues.  Plan: - Continue advair and spiriva. - Continue CPAP  Return in 6 months.  Return to clinic in 6 months  Marshell Garfinkel MD [facility] Pager [phone] If no answer or after 3pm call: [phone] 07/12/2015, 10:36 AM

## 2015-07-15 ENCOUNTER — Encounter: Payer: Medicare Other | Admitting: Nurse Practitioner

## 2015-07-15 ENCOUNTER — Other Ambulatory Visit: Payer: Medicare Other

## 2015-07-15 ENCOUNTER — Encounter: Payer: Self-pay | Admitting: Nurse Practitioner

## 2015-07-15 MED ORDER — LEVALBUTEROL HCL 0.63 MG/3ML IN NEBU: 0.6300 mg | INHALATION_SOLUTION | Freq: Four times a day (QID) | RESPIRATORY_TRACT | Status: DC | PRN

## 2015-07-15 MED ORDER — FLUTICASONE-SALMETEROL 250-50 MCG/DOSE IN AEPB: 1.0000 | INHALATION_SPRAY | Freq: Two times a day (BID) | RESPIRATORY_TRACT | Status: DC

## 2015-07-15 MED ORDER — TIOTROPIUM BROMIDE MONOHYDRATE 18 MCG IN CAPS: ORAL_CAPSULE | RESPIRATORY_TRACT | Status: DC

## 2015-07-15 NOTE — Addendum Note (Signed)
Addended by: Parke Poisson E on: 07/15/2015 09:37 AM   Modules accepted: Orders

## 2015-07-16 ENCOUNTER — Telehealth: Payer: Self-pay | Admitting: Oncology

## 2015-07-16 ENCOUNTER — Telehealth: Payer: Self-pay | Admitting: Internal Medicine

## 2015-07-16 NOTE — Telephone Encounter (Signed)
Tried to reach patient to ask her what brand glucometer she has, so I can send in the test strips. Left message for patient to call back.

## 2015-07-16 NOTE — Telephone Encounter (Signed)
Pt called in and would like her test strips sent to [facility]

## 2015-07-16 NOTE — Telephone Encounter (Signed)
returned call and s.w. pt husbad and advised that pt call back when she gets home

## 2015-07-16 NOTE — Telephone Encounter (Signed)
Need to verify which test strips pt uses did not find on current med list or in history

## 2015-07-17 ENCOUNTER — Other Ambulatory Visit: Payer: Self-pay | Admitting: Internal Medicine

## 2015-07-17 ENCOUNTER — Telehealth: Payer: Self-pay | Admitting: *Deleted

## 2015-07-17 MED ORDER — POTASSIUM CHLORIDE CRYS ER 20 MEQ PO TBCR: EXTENDED_RELEASE_TABLET | ORAL | Status: DC

## 2015-07-17 NOTE — Telephone Encounter (Signed)
It's ok to fill, but can you please call her to confirm how she is taking this?  Thanks!

## 2015-07-17 NOTE — Telephone Encounter (Signed)
Patient asking for refill. Has not seen Caryl Comes. Please advise.

## 2015-07-17 NOTE — Telephone Encounter (Signed)
LM for pt to rtn call to reschedule Survivorship appt missed on 07/15/15

## 2015-07-18 ENCOUNTER — Other Ambulatory Visit: Payer: Self-pay | Admitting: *Deleted

## 2015-07-18 MED ORDER — GLUCOSE BLOOD VI STRP: 1.0000 | ORAL_STRIP | Freq: Every day | Status: AC

## 2015-07-18 MED ORDER — GLUCOSE BLOOD VI STRP: 1.0000 | ORAL_STRIP | Freq: Every day | Status: DC

## 2015-07-18 MED ORDER — BD LANCET ULTRAFINE 33G MISC: Status: DC

## 2015-07-18 NOTE — Telephone Encounter (Signed)
Since pt is out of strips inform will send 30 day to [facility], and 90 day on strips & lancets to [facility]. Fax scripts...Johny Chess

## 2015-07-18 NOTE — Telephone Encounter (Signed)
Pt walk in requesting refill on her testing strips " [facility]" Inform Anderson Malta to verify which pharmacy, and I will send prescription...Johny Chess

## 2015-07-19 ENCOUNTER — Telehealth: Payer: Self-pay | Admitting: *Deleted

## 2015-07-19 NOTE — Telephone Encounter (Signed)
Initiated PA for [employer] thru CMM. Key: [id number]  Pt ID: [id number]  [facility] [phone] (Phone) [phone] (Fax)

## 2015-07-22 NOTE — Telephone Encounter (Signed)
Received fax from [facility] stating that it needs to be ran through Part B. Called pharmacy and they state that the dx on rx will not cover the medication. Gave dx code of COPD and it covered medication. Pharmacy states they will inform pt when ready for pick up.

## 2015-07-24 ENCOUNTER — Telehealth: Payer: Self-pay | Admitting: Oncology

## 2015-07-24 NOTE — Telephone Encounter (Signed)
s.w. pt and r/s appt per pt....pt ok and aware

## 2015-07-25 ENCOUNTER — Other Ambulatory Visit: Payer: Self-pay | Admitting: Nurse Practitioner

## 2015-07-25 DIAGNOSIS — C50212 Malignant neoplasm of upper-inner quadrant of left female breast: Secondary | ICD-10-CM

## 2015-07-26 ENCOUNTER — Other Ambulatory Visit (HOSPITAL_BASED_OUTPATIENT_CLINIC_OR_DEPARTMENT_OTHER): Payer: Medicare Other

## 2015-07-26 ENCOUNTER — Ambulatory Visit (HOSPITAL_COMMUNITY)
Admission: RE | Admit: 2015-07-26 | Discharge: 2015-07-26 | Disposition: A | Discharge status: Home / Self Care | Payer: Medicare Other | Source: Ambulatory Visit | Attending: Nurse Practitioner | Admitting: Nurse Practitioner

## 2015-07-26 ENCOUNTER — Ambulatory Visit (HOSPITAL_BASED_OUTPATIENT_CLINIC_OR_DEPARTMENT_OTHER): Payer: Medicare Other | Admitting: Nurse Practitioner

## 2015-07-26 ENCOUNTER — Telehealth: Payer: Self-pay | Admitting: Nurse Practitioner

## 2015-07-26 ENCOUNTER — Encounter: Payer: Self-pay | Admitting: Nurse Practitioner

## 2015-07-26 VITALS — BP 103/56 | HR 73 | Temp 98.0°F | Resp 19 | Ht 66.0 in | Wt 170.7 lb

## 2015-07-26 DIAGNOSIS — M25512 Pain in left shoulder: Secondary | ICD-10-CM | POA: Diagnosis not present

## 2015-07-26 DIAGNOSIS — M25511 Pain in right shoulder: Secondary | ICD-10-CM | POA: Insufficient documentation

## 2015-07-26 DIAGNOSIS — C50912 Malignant neoplasm of unspecified site of left female breast: Secondary | ICD-10-CM

## 2015-07-26 DIAGNOSIS — Z853 Personal history of malignant neoplasm of breast: Secondary | ICD-10-CM

## 2015-07-26 DIAGNOSIS — M19011 Primary osteoarthritis, right shoulder: Secondary | ICD-10-CM | POA: Diagnosis not present

## 2015-07-26 DIAGNOSIS — M84412A Pathological fracture, left shoulder, initial encounter for fracture: Secondary | ICD-10-CM | POA: Diagnosis not present

## 2015-07-26 DIAGNOSIS — Z780 Asymptomatic menopausal state: Secondary | ICD-10-CM

## 2015-07-26 DIAGNOSIS — C50212 Malignant neoplasm of upper-inner quadrant of left female breast: Secondary | ICD-10-CM

## 2015-07-26 LAB — CBC WITH DIFFERENTIAL/PLATELET
BASO%: 0.8 % (ref 0.0–2.0)
BASOS ABS: 0.1 10*3/uL (ref 0.0–0.1)
EOS ABS: 0.3 10*3/uL (ref 0.0–0.5)
EOS%: 3 % (ref 0.0–7.0)
HEMATOCRIT: 38.8 % (ref 34.8–46.6)
HGB: 12.6 g/dL (ref 11.6–15.9)
LYMPH#: 1.7 10*3/uL (ref 0.9–3.3)
LYMPH%: 16.1 % (ref 14.0–49.7)
MCH: 27.5 pg (ref 25.1–34.0)
MCHC: 32.5 g/dL (ref 31.5–36.0)
MCV: 84.4 fL (ref 79.5–101.0)
MONO#: 1 10*3/uL — AB (ref 0.1–0.9)
MONO%: 9.4 % (ref 0.0–14.0)
NEUT#: 7.3 10*3/uL — ABNORMAL HIGH (ref 1.5–6.5)
NEUT%: 70.7 % (ref 38.4–76.8)
PLATELETS: 266 10*3/uL (ref 145–400)
RBC: 4.6 10*6/uL (ref 3.70–5.45)
RDW: 18.7 % — ABNORMAL HIGH (ref 11.2–14.5)
WBC: 10.4 10*3/uL — ABNORMAL HIGH (ref 3.9–10.3)

## 2015-07-26 LAB — COMPREHENSIVE METABOLIC PANEL
ALT: 11 U/L (ref 0–55)
ANION GAP: 6 meq/L (ref 3–11)
AST: 18 U/L (ref 5–34)
Albumin: 3.3 g/dL — ABNORMAL LOW (ref 3.5–5.0)
Alkaline Phosphatase: 69 U/L (ref 40–150)
BUN: 21.4 mg/dL (ref 7.0–26.0)
CHLORIDE: 110 meq/L — AB (ref 98–109)
CO2: 26 mEq/L (ref 22–29)
CREATININE: 1 mg/dL (ref 0.6–1.1)
Calcium: 10 mg/dL (ref 8.4–10.4)
EGFR: 52 mL/min/{1.73_m2} — AB (ref >90)
Glucose: 91 mg/dl (ref 70–140)
Potassium: 4.8 mEq/L (ref 3.5–5.1)
Sodium: 141 mEq/L (ref 136–145)
Total Bilirubin: 0.36 mg/dL (ref 0.20–1.20)
Total Protein: 7.5 g/dL (ref 6.4–8.3)

## 2015-07-26 NOTE — Telephone Encounter (Signed)
appt made and scheduled mailed to patient

## 2015-07-26 NOTE — Patient Instructions (Addendum)
Thank you for coming in today!  As we discussed, please continue to perform your self breast exam and report any changes. If you note any new symptoms (please see below), be sure to notify us ASAP. I will call you with the results of your xrays ASAP.  Your mammogram will be due in October 2017 along with your next bone density test.  We will enter orders for these today and have the schedulers call you to set it up.  You are scheduled to see Dr. Jana Hakim in November 2017 and we'll have you return to see [name] in Survivorship in one year's time for your next appointment or sooner if you have any problems. Looking forward to working with you in the future!  Let us know if you have any questions!  Symptoms to Watch for and Report to Your Provider  . Return of the cancer symptoms you had before - such as a lump or new growth where your cancer first started . New or unusual pain that seems unrelated to an injury and does not go away, including back pain or bone pain . Weight loss without trying/intending . Unexplained bleeding . A rash or allergic reaction, such as swelling, severe itching or wheezing . Chills or fevers . Persistent headaches . Shortness of breath or difficulty breathing . Bloody stools or blood in your urine . Lumps, bumps, swelling and/or nipple discharge . Nausea, vomiting, diarrhea, loss of appetite, or trouble swallowing . A cough that doesn't go away . Abdominal pain . Swelling in your arms or legs . Fractures . Hot flashes or other menopausal symptoms . Any other signs mentioned by your doctor or nurse or any unusual symptoms                 that you just can't explain   NOTE: Just because you have certain symptoms, it doesn't mean the cancer has come back or you have a new cancer. Symptoms can be due to other problems that need to be addressed.  It is important to watch for these symptoms and report them to your provider so you can be medically evaluated for any of these  concerns!     Living a Life of Wellness After Cancer:  *Note: Please consult your health care provider before using any medications, supplements, over-the-counter products, or other interventions.  Also, please consult your primary care provider before you begin any lifestyle program (diet, exercise, etc.).  Your safety is our top priority and we want to make sure you continue to live a long and healthy life!    Healthy Lifestyle Recommendations  As a cancer survivor, it is important develop a lifelong commitment to a healthy lifestyle. A healthy lifestyle can prevent cancer from returning as well as prevent other diseases like heart disease, diabetes and high blood pressure.  These are some things that you can do to have a healthy lifestyle:  Marland Kitchen Maintain a healthy weight.  . Exercise daily per your doctor's orders. . Eat a balanced diet high in fruits, vegetables, bran, and fiber. Limit intake of red meat      and processed foods.  . Limit how much alcohol you consume, if at all. Ali Lowe regular bone mineral density testing for osteoporosis.  . Talk to your doctor about cardiovascular disease or "heart disease" screening. . Stop smoking (if you smoke). . Know your family history. . Be mindful of your emotional, social, and spiritual needs. . Meet regularly with a Primary Care  Provider (PCP). Find a PCP if you do not             already have one. . Talk to your doctor about regular cancer screening including screening for colon           cancer, GYN cancers, and skin cancer.

## 2015-07-26 NOTE — Progress Notes (Signed)
CLINIC:  Cancer Survivorship   REASON FOR VISIT:  Routine follow-up post-treatment for history of breast cancer.  BRIEF ONCOLOGIC HISTORY:    Malignant neoplasm of female breast ([place]) (Resolved)    Breast cancer of upper-inner quadrant of left female breast ([place])   02/03/2010 Definitive Surgery Left lumpectomy: Invasive ductal carcinoma, grade 2, DCIS, ER+ (100%), PR- (0%), HER2/neu negative, Ki67 7%   02/03/2010 Pathologic Stage Stage IA: T1c pNx    02/2010 - 01/2015 Anti-estrogen oral therapy Letrozole 2.5 mg daily    INTERVAL HISTORY:  Ms. Shorb presents to the [facility] today for ongoing follow up regarding her history of breast cancer. Overall, Ms. Mckercher reports feeling doing okay since her last visit with Dr. Jana Hakim.  She has multiple medical problems and sees a variety of specialists including a cardiologist, pulmonologist, and podiatrist.  She denies any change within her breast but reports that she has had increasing pain along her bilateral shoulders over the last 4-6 months.  She states that the pain is almost constant and will occasionally be worse in one arm vs. the other.  Today, it is worse in her left shoulder / upper arm.  She states that occasionally it will feel as if it is in the muscle.  She does not take anything regularly for it.  She denies any recent injury (although does have a history of remote injury to the left arm).  She denies any numbness or tingling in her bilateral hands that accompanies the pain.  She denies any headache or cough. She does have some shortness of breath with walking. She uses a cane to ambulate.  She reports a good appetite and denies any weight loss.    REVIEW OF SYSTEMS:  General: Denies fever, chills, unintentional weight loss, or generalized fatigue.  HEENT: Wears glasses and dentures. Denies visual changes, hearing loss, mouth sores, or difficulty swallowing. Cardiac: Denies palpitations or chest pain. Respiratory: Some  dyspnea with acitivity, otherwise, denies wheeze or shortness of breath. Breast: As above. GI: Denies abdominal pain, constipation, diarrhea, nausea, or vomiting.  GU: Denies dysuria, hematuria, vaginal bleeding, vaginal discharge, or vaginal dryness.  Musculoskeletal: As above. Neuro: Denies recent fall or numbness / tingling in her extremities.  Skin: Denies rash, pruritis, or open wounds.  Psych: Denies depression, anxiety, insomnia, or memory loss.   A 14-point review of systems was completed and was negative, except as noted above.   ONCOLOGY TREATMENT TEAM:  1. Surgeon:  Dr. Zella Richer at [facility]  2. Medical Oncologist: Dr. Jana Hakim     PAST MEDICAL/SURGICAL HISTORY:  Past Medical History  Diagnosis Date  . 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 Chronic renal insufficiency, stage III (moderate)     CrCl about 60 ml/min  . Diabetes ([place])   . Tobacco abuse   . Diastolic heart failure     Acute on Chronic  . Pacemaker     Permanent  . AF (atrial fibrillation) ([place])      AV ablation 9/09 [place] per Dr Ola Spurr - AV node ablation 9/11 Dr Caryl Comes  . Hyperlipidemia   . GERD (gastroesophageal reflux disease)   . [name] (coronary artery disease)     (not sure of this 11/10  . COPD (chronic obstructive pulmonary disease) (HCC)     emphysema -FeV1 73% DLCO 53% 5/09  . Invasive ductal carcinoma of breast ([place]) 2011    LEFT   . OA (osteoarthritis) of knee     RIGHT  .  Right sided sciatica   . Sinoatrial node dysfunction (HCC)   . CHF (congestive heart failure) ([place])   . Mental disorder   . Acute blood loss anemia 04/20/2012  . Depression 06/06/2012  . Hx of cardiovascular stress test     Lexiscan Myoview (09/2013):  No definite ischemia, EF 67%; low risk  . PAD (peripheral artery disease) (HCC)     w/hx right iliac/SFA stenting and left and rt leg PTA  . CVA (cerebral vascular accident) ([place])     no residual effects evident to pt   . History of skin cancer     . Eczema   . Complication of anesthesia     "psycotic episode" after hip surg - resolved  . Sleep apnea     associated with hypersomnia uses O2 2L/M at night and during naps also uses CPAP  . Shortness of breath dyspnea     walking distances   . Pneumonia     hx of   . Asthma   . Bronchitis     hx of   . Pain     pt states has pain in fingertips per right hand pt states has been told may be carpal tunnel  . Tremors of nervous system     hands bilat    Past Surgical History  Procedure Laterality Date  . Mastectomy, partial  02/03/2010    Left/Dr Rosenbower  . Cataract extraction, bilateral      with IOL/Dr Groat  . Breast lumpectomy      left breast  . Orif acetabular fracture Left 04/19/2012    Procedure: OPEN REDUCTION INTERNAL FIXATION (ORIF) ACETABULAR FRACTURE;  Surgeon: Rozanna Box, MD;  Location: [facility];  Service: Orthopedics;  Laterality: Left;  . Atrial ablation surgery      x 2 - "did not help - had to have pacemaker"  . Vascular surgery      both legs   . Cardiac catheterization    . Total knee arthroplasty Right 10/16/2013    Procedure: RIGHT TOTAL KNEE ARTHROPLASTY;  Surgeon: Gearlean Alf, MD;  Location: [facility];  Service: Orthopedics;  Laterality: Right;  . Sun spot removed       forhead  . Total hip arthroplasty Left 10/31/2014    Procedure: LEFT TOTAL HIP ARTHROPLASTY POSTERIOR  APPROACH;  Surgeon: Gaynelle Arabian, MD;  Location: [facility];  Service: Orthopedics;  Laterality: Left;  . Ep implantable device N/A 02/13/2015    Procedure: PPM Generator Changeout-St. Jude device;  Surgeon: Deboraha Sprang, MD;  Location: [facility];  Service: Cardiovascular;  Laterality: N/A;     ALLERGIES:  Allergies  Allergen Reactions  . Cholestatin Other (See Comments)    RAGWEED SEASON...sneezing   . Sulfur Itching     CURRENT MEDICATIONS:  Current Outpatient Prescriptions on File Prior to Visit  Medication Sig Dispense Refill  . acetaminophen (TYLENOL) 650  MG CR tablet Take 1,300 mg by mouth every 8 (eight) hours as needed for pain.    Marland Kitchen atenolol (TENORMIN) 25 MG tablet Take 25 mg by mouth 2 (two) times daily.    Marland Kitchen atorvastatin (LIPITOR) 20 MG tablet Take 1 tablet (20 mg total) by mouth daily. 90 tablet 3  . B-D ULTRA-FINE 33 LANCETS MISC Use to help check blood sugars daily Dx E11.9 100 each 2  . Biotin (BIOTIN 5000) 5 MG CAPS Take 5 mg by mouth at bedtime.     . calcium carbonate (OS-CAL - DOSED  IN MG OF ELEMENTAL CALCIUM) 1250 (500 CA) MG tablet Take 1 tablet by mouth daily with breakfast.    . cilostazol (PLETAL) 100 MG tablet Take 100 mg by mouth daily.     Marland Kitchen ELIQUIS 5 MG TABS tablet TAKE 1 TABLET TWICE DAILY  (MUST  ESTABLISH  NEW  PRIMARY CARE PROVIDER  FOR FUTURE REFILLS) 180 tablet 3  . Fluticasone-Salmeterol (ADVAIR) 250-50 MCG/DOSE AEPB Inhale 1 puff into the lungs 2 (two) times daily. 180 each 3  . glucose blood (BAYER CONTOUR TEST) test strip 1 each by Other route daily. Use to check blood sugars every day Dx E11.9 90 each 3  . ketoconazole (NIZORAL) 2 % cream Apply 1 application topically daily. 30 g 6  . levalbuterol (XOPENEX HFA) 45 MCG/ACT inhaler Inhale 2 puffs into the lungs every 6 (six) hours as needed. For shortness of breath    . levalbuterol (XOPENEX) 0.63 MG/3ML nebulizer solution Take 3 mLs (0.63 mg total) by nebulization every 6 (six) hours as needed for wheezing or shortness of breath. 75 mL 5  . Linaclotide (LINZESS) 145 MCG CAPS capsule Take 1 capsule (145 mcg total) by mouth daily. 30 capsule 3  . metFORMIN (GLUCOPHAGE) 500 MG tablet TAKE 1 TABLET TWICE DAILY WITH A MEAL (Patient taking differently: Taking 1 tablet daily) 180 tablet 3  . Multiple Vitamins-Minerals (MULTIVITAMIN WITH MINERALS) tablet Take 1 tablet by mouth 2 (two) times daily.     . potassium chloride SA (K-DUR,KLOR-CON) 20 MEQ tablet Take 40 mEq by mouth 2 (two) times daily.    . potassium chloride SA (KLOR-CON M20) 20 MEQ tablet Patient takes 2  tablets in the am and 2 tablets in the pm by mouth. 360 tablet 3  . tiotropium (SPIRIVA HANDIHALER) 18 MCG inhalation capsule INHALE THE CONTENTS OF 1 CAPSULE EVERY DAY 90 capsule 3  . torsemide (DEMADEX) 10 MG tablet TAKE 1 TABLET EVERY DAY 90 tablet 1   No current facility-administered medications on file prior to visit.     ONCOLOGIC FAMILY HISTORY:  Family History  Problem Relation Age of Onset  . Heart disease Father      GENETIC COUNSELING/TESTING: No   SOCIAL HISTORY:  LATTIE RIEGE is married and lives with her husband in [place], [place].  She has 4 children. 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  She is a former smoker, having quit in 2013.  She uses alcohol on occasion and denies any current or history of illicit drug use.     PHYSICAL EXAMINATION:  Vital Signs: Filed Vitals:   07/26/15 1204  BP: 103/56  Pulse: 73  Temp: 98 F (36.7 C)  Resp: 19   Weight: 170.7 (increase of 6.6 pounds since November 2016) ECOG performance status: 1 General: Well-nourished, well-appearing female in no acute distress.  She is accompanied in clinic  today by her caregiver. HEENT: Head is atraumatic and normocephalic.  Pupils equal and reactive to light and accomodation. Conjunctivae clear without exudate.  Sclerae anicteric. Oral mucosa is pink, moist, and intact without lesions.  Oropharynx is pink without lesions or erythema.  Lymph: No cervical, supraclavicular, infraclavicular, or axillary lymphadenopathy noted on palpation.  Cardiovascular: Regular rate and rhythm without murmurs, rubs, or gallops. Respiratory: Clear to auscultation bilaterally. Chest expansion symmetric without accessory muscle use on inspiration or expiration.  Breast: Bilateral breast exam performed.  No mass or lesion in either breast bilaterally.  Left lumpectomy scar intact without nodularity.  GI: Abdomen soft and round. No tenderness to palpation. Bowel  sounds normoactive in 4 quadrants. GU: Deferred.  Musculoskeletal: Pain to palpation in bilateral shoulders, left greater than right, as well as along upper aspect of humerus. Neuro: No focal deficits. Uses cane to assist with ambulation. Psych: Mood and affect normal and appropriate for situation. Extremities: Slight swelling in left ankle, related to previous hip surgery. Stable per patient.  Skin: Warm and dry. No open lesions noted.   LABORATORY DATA:  Recent Results (from the past 2160 hour(s))  Implantable device - remote     Status: None   Collection Time: 06/24/15  3:53 PM  Result Value Ref Range   Pulse Generator Manufacturer SJ[employer]    Date Time Interrogation Session [phone]    Pulse Gen Model 2240 Assurity    Pulse Gen Serial Number [id number]    Implantable Pulse Generator Type Implantable Pulse Generator    Implantable Pulse Generator Implant Date [id number]    Implantable Lead Manufacturer PACE    Implantable Lead Model 1642T    Implantable Lead Serial Number [id number]    Implantable Lead Implant Date [id number]    Implantable Lead Location [id number]    Implantable Lead Manufacturer [facility]    Implantable Lead Model 1646T IsoFlex S    Implantable Lead Serial Number [id number]    Implantable Lead Implant Date [id number]    Implantable Lead Location [id number]    Lead Channel Setting Sensing Sensitivity 5.0 mV   Lead Channel Setting Pacing Pulse Width 0.5 ms   Lead Channel Setting Pacing Amplitude 0.875    Lead Channel Impedance Value 580 ohm   Lead Channel Pacing Threshold Amplitude 0.625 V   Lead Channel Pacing Threshold Pulse Width 0.5 ms   Brady Statistic RV Percent Paced 99 %   Eval Rhythm Vp   CBC with Differential     Status: Abnormal   Collection Time: 07/26/15 11:47 AM  Result Value Ref Range   WBC 10.4 (H) 3.9 - 10.3 10e3/uL   NEUT# 7.3 (H) 1.5 - 6.5 10e3/uL   HGB 12.6 11.6 - 15.9 g/dL   HCT 38.8 34.8 - 46.6 %   Platelets 266 145 - 400 10e3/uL   MCV 84.4 79.5  - 101.0 fL   MCH 27.5 25.1 - 34.0 pg   MCHC 32.5 31.5 - 36.0 g/dL   RBC 4.60 3.70 - 5.45 10e6/uL   RDW 18.7 (H) 11.2 - 14.5 %   lymph# 1.7 0.9 - 3.3 10e3/uL   MONO# 1.0 (H) 0.1 - 0.9 10e3/uL   Eosinophils Absolute 0.3 0.0 - 0.5 10e3/uL   Basophils Absolute 0.1 0.0 - 0.1 10e3/uL   NEUT% 70.7 38.4 - 76.8 %   LYMPH% 16.1 14.0 - 49.7 %   MONO% 9.4 0.0 - 14.0 %   EOS% 3.0 0.0 - 7.0 %   BASO% 0.8 0.0 - 2.0 %  Comprehensive metabolic panel     Status: Abnormal   Collection Time: 07/26/15 11:47 AM  Result Value Ref Range   Sodium 141 136 - 145 mEq/L   Potassium 4.8 3.5 - 5.1 mEq/L   Chloride 110 (H) 98 - 109 mEq/L   CO2 26 22 - 29 mEq/L   Glucose 91 70 - 140 mg/dl    Comment: Glucose reference range is for nonfasting patients. Fasting glucose reference range is 70- 100.   BUN 21.4 7.0 - 26.0 mg/dL   Creatinine 1.0 0.6 - 1.1 mg/dL   Total Bilirubin 0.36 0.20 - 1.20 mg/dL  Alkaline Phosphatase 69 40 - 150 U/L   AST 18 5 - 34 U/L   ALT 11 0 - 55 U/L   Total Protein 7.5 6.4 - 8.3 g/dL   Albumin 3.3 (L) 3.5 - 5.0 g/dL   Calcium 10.0 8.4 - 10.4 mg/dL   Anion Gap 6 3 - 11 mEq/L   EGFR 52 (L) >90 ml/min/1.73 m2    Comment: eGFR is calculated using the CKD-EPI Creatinine Equation (2009)      ASSESSMENT AND PLAN:   1. History of breast cancer: Stage IA invasive ductal carcinoma of the left breast (01/2010), ER positive, PR negative, HER2/neu negative, S/P lumpectomy followed by five years of adjuvant endocrine therapy with letrozole completed 01/2015 now followed in a program of surveillance.  Ms. Mccard is doing well with no clinical symptoms worrisome for cancer recurrence at this time within her breast.  As below, I believe her complaints of bone pain are more likely related to arthritis vs. Malignancy. Her labwork is stable today and she has no focal symptoms of infection.  Her white count is slightly elevated, however, it has been elevated in the past with no infectious etiology.  Her  hemoglobin is higher today.   I have reviewed the recommendations for ongoing surveillance with her and she will follow-up with her medical oncologist,  Dr. Jana Hakim, in November 2017 with history and physical exam per surveillance protocol.  At that time, she will most likely be ready to "graduate" and I have discussed with her and her caregiver the possibility of her being seen here in the Survivorship program moving forward.  She is in agreement with this plan and I will therefore make her an appointment to return to see me in one year's time.  She was instructed to make [name] aware if she notes any change within her breast, any new symptoms such as pain, shortness of breath, weight loss, or fatigue.    2. Bilateral shoulder pain: Given the fact that Ms. Fullington's shoulder pain has been increasing over the last 4-6 months, I have recommended to her that we proceed with plain films.  It sounds more arthritic in nature rather than related to her cancer, but I believe it is prudent to evaluate it at this time. Calcium stable on labs from today. Further disposition pending those results.    3. Bone health:  Given Ms. Handlin's age/history of breast cancer and her recent treatment regimen including endocrine therapy with letrozole, she is at risk for increased bone demineralization. She is taking zoledronic acid and is scheduled for her next annual injection in November 2017.  Per our records, her last DEXA scan was performed in 2015.  We will update this along with her mammography, as above, in October 2017 and pending those results, will make further decisions about her bisphosphonate therapy. In the meantime, she was encouraged to increase her consumption of foods rich in calcium and vitamin D as well as to increase her weight-bearing activities.      4. Support services/counseling: Ms. Lasker was offered support today through active listening and expressive supportive counseling.   A total of 30 minutes of  face-to-face time was spent with this patient with greater than 50% of that time in counseling and care-coordination.   Sylvan Cheese, NP  Survivorship Program [facility] [phone]   Note: PRIMARY CARE PROVIDER Hoyt Koch, [place] [phone]

## 2015-07-29 ENCOUNTER — Telehealth: Payer: Self-pay | Admitting: Internal Medicine

## 2015-07-29 NOTE — Telephone Encounter (Signed)
Pt called in and needs   B-D ULTRA-FINE 33 LANCETS MISC [[id number]]     Sent to [facility] [address]

## 2015-07-30 ENCOUNTER — Other Ambulatory Visit: Payer: Self-pay | Admitting: Geriatric Medicine

## 2015-07-30 MED ORDER — BD LANCET ULTRAFINE 33G MISC: Status: AC

## 2015-07-30 NOTE — Telephone Encounter (Signed)
Sent to pharmacy 

## 2015-07-31 ENCOUNTER — Encounter: Payer: Self-pay | Admitting: Nurse Practitioner

## 2015-08-08 ENCOUNTER — Other Ambulatory Visit: Payer: Self-pay | Admitting: Internal Medicine

## 2015-08-21 ENCOUNTER — Ambulatory Visit (INDEPENDENT_AMBULATORY_CARE_PROVIDER_SITE_OTHER): Payer: Medicare Other | Admitting: *Deleted

## 2015-08-21 DIAGNOSIS — I495 Sick sinus syndrome: Secondary | ICD-10-CM | POA: Diagnosis not present

## 2015-08-21 NOTE — Progress Notes (Signed)
Remote pacemaker transmission.   

## 2015-08-23 ENCOUNTER — Ambulatory Visit (INDEPENDENT_AMBULATORY_CARE_PROVIDER_SITE_OTHER): Payer: Medicare Other | Admitting: Podiatry

## 2015-08-23 ENCOUNTER — Encounter: Payer: Self-pay | Admitting: Podiatry

## 2015-08-23 DIAGNOSIS — M129 Arthropathy, unspecified: Secondary | ICD-10-CM

## 2015-08-23 DIAGNOSIS — M79676 Pain in unspecified toe(s): Secondary | ICD-10-CM

## 2015-08-23 DIAGNOSIS — B351 Tinea unguium: Secondary | ICD-10-CM | POA: Diagnosis not present

## 2015-08-23 MED ORDER — MELOXICAM 7.5 MG PO TABS: 7.5000 mg | ORAL_TABLET | Freq: Two times a day (BID) | ORAL | Status: DC

## 2015-08-23 NOTE — Progress Notes (Signed)
Patient ID: [name], female   DOB: November 27, 1931, 82 y.o.   MRN: [id number] Complaint:  Visit Type: Patient returns to my office for continued preventative foot care services. Complaint: Patient states" my nails have grown long and thick and become painful to walk and wear shoes" Patient has been diagnosed with DM with no foot complications. The patient presents for preventative foot care services. No changes to ROS  Podiatric Exam: Vascular: dorsalis pedis and posterior tibial pulses are palpable bilateral. Capillary return is immediate. Temperature gradient is WNL. Skin turgor WNL  Sensorium: Normal Semmes Weinstein monofilament test. Normal tactile sensation bilaterally. Nail Exam: Pt has thick disfigured discolored nails with subungual debris noted bilateral entire nail hallux through fifth toenails Ulcer Exam: There is no evidence of ulcer or pre-ulcerative changes or infection. Orthopedic Exam: Muscle tone and strength are WNL. No limitations in general ROM. No crepitus or effusions noted. Foot type and digits show no abnormalities. Bony prominences are unremarkable. Skin: No Porokeratosis. No infection or ulcers  Diagnosis:  Onychomycosis, , Pain in right toe, pain in left toes  Treatment & Plan Procedures and Treatment: Consent by patient was obtained for treatment procedures. The patient understood the discussion of treatment and procedures well. All questions were answered thoroughly reviewed. Debridement of mycotic and hypertrophic toenails, 1 through 5 bilateral and clearing of subungual debris. No ulceration, no infection noted.  Patient has occasional pain right foot brom back problem  She requests medicine to help reduce this occasional pain. Return Visit-Office Procedure: Patient instructed to return to the office for a follow up visit 3 months for continued evaluation and treatment.    Gardiner Barefoot DPM

## 2015-08-26 DIAGNOSIS — Z950 Presence of cardiac pacemaker: Secondary | ICD-10-CM | POA: Diagnosis not present

## 2015-08-27 LAB — CUP PACEART REMOTE DEVICE CHECK
Brady Statistic RV Percent Paced: 99 %
Date Time Interrogation Session: 20170620110106
Implantable Lead Implant Date: 20051116
Implantable Lead Location: 753860
Lead Channel Setting Pacing Amplitude: 0.875
Lead Channel Setting Sensing Sensitivity: 5 mV
MDC IDC LEAD IMPLANT DT: 20051116
MDC IDC LEAD LOCATION: 753859
MDC IDC MSMT BATTERY REMAINING LONGEVITY: 128 mo
MDC IDC MSMT BATTERY REMAINING PERCENTAGE: 95 % — AB
MDC IDC MSMT BATTERY VOLTAGE: 3.02 V
MDC IDC MSMT LEADCHNL RV IMPEDANCE VALUE: 560 Ohm
MDC IDC MSMT LEADCHNL RV PACING THRESHOLD AMPLITUDE: 0.625 V
MDC IDC MSMT LEADCHNL RV PACING THRESHOLD PULSEWIDTH: 0.5 ms
MDC IDC SET LEADCHNL RV PACING PULSEWIDTH: 0.5 ms
Pulse Gen Model: 2240
Pulse Gen Serial Number: 7838791

## 2015-08-28 ENCOUNTER — Encounter: Payer: Self-pay | Admitting: Cardiology

## 2015-08-29 ENCOUNTER — Telehealth: Payer: Self-pay | Admitting: *Deleted

## 2015-08-29 NOTE — Telephone Encounter (Signed)
Tried calling pt x's 2 line keep being busy...[name]

## 2015-08-29 NOTE — Telephone Encounter (Signed)
Left msg on triage stating went to see her Podiatrist inform him about the pain sxs in her legs/foot. He has given her rx for Meloxicam 7.5 to take twice a day. Pt is wanting to ask md will med not interfere with her other medications...[name]

## 2015-08-29 NOTE — Telephone Encounter (Signed)
With her eliquis she probably should not take this medicine due to the increased risk of bleeding and stomach problems.

## 2015-09-04 ENCOUNTER — Telehealth: Payer: Self-pay

## 2015-09-04 NOTE — Telephone Encounter (Signed)
Spoke with patient. She is concerned that gabapentin may not be safe for her to take along with her other medications. She said the foot doctor did not ask her for a list of the current medications that she is taking. Please advise if gabapentin has an contraindications with her other medications, thanks.

## 2015-09-04 NOTE — Telephone Encounter (Signed)
meloxicam (MOBIC) 7.5 MG tablet [[id number]]      Patient called and has some questions about this medication. Her foot doctor gave it to her. And she is not going to take it unless Dr. Sharlet Salina says its ok too.. Can you please follow up.

## 2015-09-05 NOTE — Telephone Encounter (Signed)
I called patient and informed her that gabapentin is safe to take.

## 2015-09-05 NOTE — Telephone Encounter (Signed)
Gabapentin is safe to try with her current medications.

## 2015-09-12 ENCOUNTER — Encounter: Payer: Self-pay | Admitting: Cardiology

## 2015-09-23 ENCOUNTER — Ambulatory Visit (INDEPENDENT_AMBULATORY_CARE_PROVIDER_SITE_OTHER): Payer: Medicare Other | Admitting: Internal Medicine

## 2015-09-23 ENCOUNTER — Encounter: Payer: Self-pay | Admitting: Internal Medicine

## 2015-09-23 VITALS — BP 106/58 | HR 73 | Temp 97.3°F | Resp 22 | Ht 65.5 in | Wt 169.0 lb

## 2015-09-23 DIAGNOSIS — J449 Chronic obstructive pulmonary disease, unspecified: Secondary | ICD-10-CM | POA: Diagnosis not present

## 2015-09-23 NOTE — Patient Instructions (Signed)
We will get you in with the physical therapy department to get evaluated for the electric scooter.   Based on our conversation today and the information you provided I do not think it is likely that you will qualify. If they decide that you will qualify you may have to return here for another visit since we were not able to get many of the questions we needed answered.

## 2015-09-23 NOTE — Progress Notes (Signed)
Pre visit review using our clinic review tool, if applicable. No additional management support is needed unless otherwise documented below in the visit note. 

## 2015-09-23 NOTE — Progress Notes (Signed)
   Subjective:    Patient ID: [name], female    DOB: 1931-11-08, 82 y.o.   MRN: [id number]  HPI The patient is an 82 YO female coming in for evaluation for electric wheelchair. She is currently using a cane. She is able to walk normally with the cane. She does get winded and has to rest. This is an inconvenience for her. She is not able to shop or go anywhere. She is contemplating move to assisted living facility and fears she will not be able to make it to the dining hall. When she is asked about other mobility aids such as walker with seat or manual wheelchair she states "I have all those" and that they "are no good". When I try to ask her why they did not work or why she was not able to use them effectively she is not able to answer and she is angry with me stating that "she is not malingering and she needs the electric wheelchair and she deserves it and she does not know why I am asking her these questions when she needs the wheelchair". She is not able to get back on conversation and the visit was prematurely ended due to lack of productivity and insults directed toward the provider.   Review of Systems  Constitutional: Positive for activity change and fatigue. Negative for fever, chills, appetite change and unexpected weight change.  HENT: Negative.   Eyes: Negative.   Respiratory: Positive for shortness of breath. Negative for cough, chest tightness, wheezing and stridor.   Cardiovascular: Negative for chest pain, palpitations and leg swelling.  Gastrointestinal: Negative for nausea, vomiting, diarrhea, constipation, blood in stool and abdominal distention.  Musculoskeletal: Positive for arthralgias and gait problem.       Slow gait  Skin: Negative for color change, rash and wound.  Neurological: Negative for dizziness, syncope, numbness and headaches.  Psychiatric/Behavioral: Positive for dysphoric mood.      Objective:   Physical Exam  Constitutional: She appears  well-developed and well-nourished.  HENT:  Head: Normocephalic and atraumatic.  Eyes: EOM are normal. Pupils are equal, round, and reactive to light.  Neck: Normal range of motion. Neck supple. No JVD present.  Cardiovascular: Normal rate.   Paced  Pulmonary/Chest: Effort normal and breath sounds normal. No respiratory distress. She has no wheezes. She has no rales. She exhibits no tenderness.  Abdominal: Soft. Bowel sounds are normal. She exhibits no distension. There is no tenderness. There is no rebound and no guarding.  Neurological: She is alert. No cranial nerve deficit.  Able to walk slowly with cane and no instability or falls in the office. She is able to walk from waiting area to room without stopping to rest.   Skin: Skin is warm and dry.   Filed Vitals:   09/23/15 1506  BP: 106/58  Pulse: 73  Temp: 97.3 F (36.3 C)  TempSrc: Oral  Resp: 22  Height: 5' 5.5" (1.664 m)  Weight: 169 lb (76.658 kg)  SpO2: 91%      Assessment & Plan:  Visit time 25 minutes: greater than 50% of that time was spent in counseling and coordination of care with the patient: counseling on the regulations of medicare and the need for information about necessity for electric wheelchair requirements as well as de-escalation of the conversation as she was deeply offended with the questions that I was asking to get information about her mobility.

## 2015-09-24 NOTE — Assessment & Plan Note (Signed)
Having to stop to rest is her main concern. It is my opinion that a walker with seat would be ideal as she could stop and rest while walking distances longer than her endurance. She is not able to answer any of my questions about her mobility during the visit as she is demanding an electric wheelchair (although I was able to clarify she wants an Transport planner instead). I did try to educate her on the regulations and necessity requirements for medicare in order to get this paid for and also educated her that she could buy an electric scooter without going through this process if she desired. Referral to PT for evaluation for need of electric scooter device as she is insistent on getting evaluation. If she does qualify she would need an additional visit in which I can get some information about her mobility.

## 2015-10-09 DIAGNOSIS — D0439 Carcinoma in situ of skin of other parts of face: Secondary | ICD-10-CM | POA: Diagnosis not present

## 2015-10-09 DIAGNOSIS — D225 Melanocytic nevi of trunk: Secondary | ICD-10-CM | POA: Diagnosis not present

## 2015-10-09 DIAGNOSIS — D692 Other nonthrombocytopenic purpura: Secondary | ICD-10-CM | POA: Diagnosis not present

## 2015-10-09 DIAGNOSIS — D485 Neoplasm of uncertain behavior of skin: Secondary | ICD-10-CM | POA: Diagnosis not present

## 2015-10-09 DIAGNOSIS — C44319 Basal cell carcinoma of skin of other parts of face: Secondary | ICD-10-CM | POA: Diagnosis not present

## 2015-10-09 DIAGNOSIS — D2239 Melanocytic nevi of other parts of face: Secondary | ICD-10-CM | POA: Diagnosis not present

## 2015-10-09 DIAGNOSIS — L821 Other seborrheic keratosis: Secondary | ICD-10-CM | POA: Diagnosis not present

## 2015-10-09 DIAGNOSIS — D224 Melanocytic nevi of scalp and neck: Secondary | ICD-10-CM | POA: Diagnosis not present

## 2015-10-09 DIAGNOSIS — D1801 Hemangioma of skin and subcutaneous tissue: Secondary | ICD-10-CM | POA: Diagnosis not present

## 2015-10-09 DIAGNOSIS — Z85828 Personal history of other malignant neoplasm of skin: Secondary | ICD-10-CM | POA: Diagnosis not present

## 2015-10-15 ENCOUNTER — Telehealth: Payer: Self-pay | Admitting: Internal Medicine

## 2015-10-15 NOTE — Telephone Encounter (Signed)
Patient called to advise that she has not heard anything yet about the motorized scooter that she is supposed to be getting.

## 2015-10-16 NOTE — Telephone Encounter (Signed)
Spoke with patient and advised her to call [facility] because that's where the referral was sent for the wheelchair assessment.

## 2015-10-26 DIAGNOSIS — H00016 Hordeolum externum left eye, unspecified eyelid: Secondary | ICD-10-CM | POA: Diagnosis not present

## 2015-11-01 ENCOUNTER — Encounter: Payer: Medicare Other | Admitting: Podiatry

## 2015-11-04 NOTE — Progress Notes (Signed)
This encounter was created in error - please disregard.

## 2015-11-08 ENCOUNTER — Telehealth: Payer: Self-pay | Admitting: Emergency Medicine

## 2015-11-08 NOTE — Telephone Encounter (Signed)
Pt would like [facility] to come pick up some portalable oxygen equipment that is not being used. [facility] needs a prescription or a letter stating pt doesn't need the equipment anymore so they can pick it up. Please advise thanks.

## 2015-11-12 NOTE — Telephone Encounter (Signed)
This would need to go through her pulmonologist as likely they have ordered the oxygen as I have not ordered oxygen for her. If we were to issue that letter we would need a visit to assess oxygen levels with activity.

## 2015-11-12 NOTE — Telephone Encounter (Signed)
Patient aware and will call her pulmonologist.

## 2015-11-13 DIAGNOSIS — H00024 Hordeolum internum left upper eyelid: Secondary | ICD-10-CM | POA: Diagnosis not present

## 2015-11-14 ENCOUNTER — Other Ambulatory Visit: Payer: Self-pay | Admitting: Internal Medicine

## 2015-11-18 ENCOUNTER — Ambulatory Visit: Payer: Medicare Other | Admitting: Internal Medicine

## 2015-11-20 ENCOUNTER — Encounter: Payer: Self-pay | Admitting: Cardiology

## 2015-11-20 ENCOUNTER — Telehealth: Payer: Self-pay | Admitting: Cardiology

## 2015-11-20 ENCOUNTER — Encounter: Payer: Medicare Other | Admitting: *Deleted

## 2015-11-20 NOTE — Telephone Encounter (Signed)
Spoke with pt and reminded pt of remote transmission that is due today. Pt doesn't understand.  I asked if I could mail instructions and pt said this is ok. Pt has appt w/ MD in December 2017. She said if she couldn't figure it out she would bring her monitor to this appt.

## 2015-11-22 ENCOUNTER — Ambulatory Visit (INDEPENDENT_AMBULATORY_CARE_PROVIDER_SITE_OTHER): Payer: Medicare Other | Admitting: Internal Medicine

## 2015-11-22 ENCOUNTER — Other Ambulatory Visit (INDEPENDENT_AMBULATORY_CARE_PROVIDER_SITE_OTHER): Payer: Medicare Other

## 2015-11-22 ENCOUNTER — Encounter: Payer: Self-pay | Admitting: Internal Medicine

## 2015-11-22 ENCOUNTER — Encounter: Payer: Self-pay | Admitting: Cardiology

## 2015-11-22 VITALS — HR 72 | Temp 98.1°F | Ht 65.5 in | Wt 171.0 lb

## 2015-11-22 DIAGNOSIS — E118 Type 2 diabetes mellitus with unspecified complications: Secondary | ICD-10-CM | POA: Diagnosis not present

## 2015-11-22 DIAGNOSIS — Z23 Encounter for immunization: Secondary | ICD-10-CM

## 2015-11-22 DIAGNOSIS — M79622 Pain in left upper arm: Secondary | ICD-10-CM

## 2015-11-22 DIAGNOSIS — M25522 Pain in left elbow: Secondary | ICD-10-CM

## 2015-11-22 DIAGNOSIS — M25529 Pain in unspecified elbow: Secondary | ICD-10-CM | POA: Insufficient documentation

## 2015-11-22 LAB — LIPID PANEL
CHOL/HDL RATIO: 3
Cholesterol: 133 mg/dL (ref 0–200)
HDL: 43.7 mg/dL (ref >39.00)
NONHDL: 89.37
Triglycerides: 205 mg/dL — ABNORMAL HIGH (ref 0.0–149.0)
VLDL: 41 mg/dL — AB (ref 0.0–40.0)

## 2015-11-22 LAB — HEMOGLOBIN A1C: HEMOGLOBIN A1C: 7.1 % — AB (ref 4.6–6.5)

## 2015-11-22 LAB — LDL CHOLESTEROL, DIRECT: LDL DIRECT: 67 mg/dL

## 2015-11-22 MED ORDER — LIDOCAINE 5 % EX PTCH: 1.0000 | MEDICATED_PATCH | CUTANEOUS | 0 refills | Status: DC

## 2015-11-22 NOTE — Assessment & Plan Note (Signed)
On metformin and needs HgA1c today.

## 2015-11-22 NOTE — Progress Notes (Signed)
   Subjective:    Patient ID: [name], female    DOB: 05-22-1931, 82 y.o.   MRN: [id number]  HPI The patient is an 82 YO female coming in for arm pain going on for about 1-2 years off and on. She takes tylenol at home with partial relief. Overall she does not like to take medicines and does not want to try other pills. She has fallen a couple of times in the last 6 months and has fallen on the left arm. She denies that she injured it where it is hurting. It hurts in the upper arm and shoulder bilaterally. Denies swelling or numbness or weakness. She does have decreased ROM.   Review of Systems  Constitutional: Positive for activity change and fatigue. Negative for appetite change, chills, fever and unexpected weight change.  Eyes: Negative.   Respiratory: Negative for cough, chest tightness, wheezing and stridor.   Cardiovascular: Negative for chest pain, palpitations and leg swelling.  Gastrointestinal: Negative for abdominal distention, blood in stool, constipation, diarrhea, nausea and vomiting.  Musculoskeletal: Positive for arthralgias, gait problem and myalgias.       Slow gait  Skin: Negative for color change, rash and wound.  Neurological: Negative for dizziness, syncope, numbness and headaches.      Objective:   Physical Exam  Constitutional: She appears well-developed and well-nourished.  HENT:  Head: Normocephalic and atraumatic.  Eyes: EOM are normal. Pupils are equal, round, and reactive to light.  Neck: Normal range of motion. Neck supple. No JVD present.  Cardiovascular: Normal rate.   Paced  Pulmonary/Chest: Effort normal and breath sounds normal. No respiratory distress. She has no wheezes. She has no rales. She exhibits no tenderness.  Abdominal: Soft. Bowel sounds are normal. She exhibits no distension. There is no tenderness. There is no rebound and no guarding.  Musculoskeletal: She exhibits tenderness.  Some pain in the biceps region and shoulder as well  as the upper back bilaterally, no focal tenderness. Does not want to perform ROM in the office.   Neurological: She is alert. No cranial nerve deficit.  Skin: Skin is warm and dry.   Vitals:   11/22/15 1321  Pulse: 72  Temp: 98.1 F (36.7 C)  TempSrc: Oral  SpO2: 92%  Weight: 171 lb (77.6 kg)  Height: 5' 5.5" (1.664 m)      Assessment & Plan:  Flu and pneumonia shot given at visit.

## 2015-11-22 NOTE — Assessment & Plan Note (Signed)
Refer to sports medicine and rx for lidoderm patches for pain as she does not want to take oral meds.

## 2015-11-22 NOTE — Progress Notes (Signed)
Pre visit review using our clinic review tool, if applicable. No additional management support is needed unless otherwise documented below in the visit note. 

## 2015-11-22 NOTE — Patient Instructions (Signed)
We will have you schedule with Dr. Tamala Julian of sports medicine to help with the arm pain.  We have given you the pneumonia and flu shot today.

## 2015-11-26 ENCOUNTER — Telehealth: Payer: Self-pay

## 2015-11-26 NOTE — Telephone Encounter (Signed)
PA initiated via CoverMyMeds key [id number]

## 2015-11-26 NOTE — Telephone Encounter (Signed)
PA DENIED, please advise on alternative treatment. Thanks!

## 2015-11-27 ENCOUNTER — Telehealth: Payer: Self-pay | Admitting: Internal Medicine

## 2015-11-27 MED ORDER — LIDOCAINE 5 % EX OINT: 1.0000 "application " | TOPICAL_OINTMENT | CUTANEOUS | 6 refills | Status: DC | PRN

## 2015-11-27 NOTE — Telephone Encounter (Signed)
Pt called stating [employer] said we have to start PA for lidocaine (XYLOCAINE) 5 % ointment. Please help

## 2015-11-27 NOTE — Telephone Encounter (Signed)
Would recommend to try PA and continue other treatment for now.

## 2015-11-27 NOTE — Telephone Encounter (Signed)
Please advise Dr Sharlet Salina. Thanks!

## 2015-11-27 NOTE — Telephone Encounter (Signed)
Pt advise and agrees with treatment

## 2015-11-27 NOTE — Telephone Encounter (Signed)
Sent in lidocaine ointment instead which she can use as needed during the day.

## 2015-11-28 DIAGNOSIS — I495 Sick sinus syndrome: Secondary | ICD-10-CM | POA: Diagnosis not present

## 2015-11-28 DIAGNOSIS — Z950 Presence of cardiac pacemaker: Secondary | ICD-10-CM | POA: Diagnosis not present

## 2015-12-02 NOTE — Telephone Encounter (Signed)
PA Case: [id number], Status: Denied

## 2015-12-04 DIAGNOSIS — H00024 Hordeolum internum left upper eyelid: Secondary | ICD-10-CM | POA: Diagnosis not present

## 2015-12-04 DIAGNOSIS — Z961 Presence of intraocular lens: Secondary | ICD-10-CM | POA: Diagnosis not present

## 2015-12-07 NOTE — Progress Notes (Signed)
[name] [address], [place] [address] Phone: [phone] Subjective:    I'm seeing this patient by the request  of:  Hoyt Koch, MD   CC: Arm pain and shoulder pain left  QPR:[id number]  [name] is a 82 y.o. female coming in with complaint of left arm pain. Has been going on for a couple years. Seems to be intermittent but now seems to be worsening. Has been taking Tylenol for relief. Does not seem to be helping as much. Has unfortunately had a couple falls over the course last 6-8 months. Does not remember any injury though. Seems to be more the upper arm and shoulder but now is starting to be bilaterally. Noticing some mild decreased range of motion as well as weakness. Rates the severity of pain a 7 out of 10 and worsening.   previous imaging: X-rays taken 07/26/2015 show moderate glenohumeral degenerative changes bilaterally with a chronic fracture of the humeral head and surgical neck on the left side  Past Medical History:  Diagnosis Date  . Acute blood loss anemia 04/20/2012  . AF (atrial fibrillation) ([place])     AV ablation 9/09 [place] per Dr Ola Spurr - AV node ablation 9/11 Dr Caryl Comes  . Amiodarone pulmonary toxicity ([place])   . Asthma   . Bronchitis    hx of   . [name] (coronary artery disease)    (not sure of this 11/10  . CHF (congestive heart failure) ([place])   . Chronic renal insufficiency, stage III (moderate)    CrCl about 60 ml/min  . Complication of anesthesia    "psycotic episode" after hip surg - resolved  . COPD (chronic obstructive pulmonary disease) ([facility])    emphysema -FeV1 73% DLCO 53% 5/09  . CVA (cerebral vascular accident) ([place])    no residual effects evident to pt   . Depression 06/06/2012  . Diabetes ([place])   . Diastolic heart failure    Acute on Chronic  . Eczema   . GERD (gastroesophageal reflux disease)   . History of skin cancer   . Hx of cardiovascular stress test    Lexiscan Myoview  (09/2013):  No definite ischemia, EF 67%; low risk  . Hyperlipidemia   . Invasive ductal carcinoma of breast ([place]) 2011   LEFT   . Mental disorder   . OA (osteoarthritis) of knee    RIGHT  . Pacemaker    Permanent  . PAD (peripheral artery disease) ([facility])    w/hx right iliac/SFA stenting and left and rt leg PTA  . Pain    pt states has pain in fingertips per right hand pt states has been told may be carpal tunnel  . Pneumonia    hx of   . Right sided sciatica   . Shortness of breath dyspnea    walking distances   . Sinoatrial node dysfunction ([facility])   . Sleep apnea    associated with hypersomnia uses O2 2L/M at night and during naps also uses CPAP  . Tobacco abuse   . Tremors of nervous system    hands bilat    Past Surgical History:  Procedure Laterality Date  . ATRIAL ABLATION SURGERY     x 2 - "did not help - had to have pacemaker"  . BREAST LUMPECTOMY     left breast  . CARDIAC CATHETERIZATION    . CATARACT EXTRACTION, BILATERAL     with IOL/Dr Katy Fitch  . EP IMPLANTABLE DEVICE  N/A 02/13/2015   Procedure: PPM Generator Changeout-St. Jude device;  Surgeon: Deboraha Sprang, MD;  Location: [facility];  Service: Cardiovascular;  Laterality: N/A;  . MASTECTOMY, PARTIAL  02/03/2010   Left/Dr Rosenbower  . ORIF ACETABULAR FRACTURE Left 04/19/2012   Procedure: OPEN REDUCTION INTERNAL FIXATION (ORIF) ACETABULAR FRACTURE;  Surgeon: Rozanna Box, MD;  Location: [facility];  Service: Orthopedics;  Laterality: Left;  . sun spot removed      forhead  . TOTAL HIP ARTHROPLASTY Left 10/31/2014   Procedure: LEFT TOTAL HIP ARTHROPLASTY POSTERIOR  APPROACH;  Surgeon: Gaynelle Arabian, MD;  Location: [facility];  Service: Orthopedics;  Laterality: Left;  . TOTAL KNEE ARTHROPLASTY Right 10/16/2013   Procedure: RIGHT TOTAL KNEE ARTHROPLASTY;  Surgeon: Gearlean Alf, MD;  Location: [facility];  Service: Orthopedics;  Laterality: Right;  Marland Kitchen VASCULAR SURGERY     both legs    Social History   Social  History  . Marital status: Married    Spouse name: N/A  . Number of children: N/A  . Years of education: N/A   Social History Main Topics  . Smoking status: Former Smoker    Packs/day: 1.00    Years: 55.00    Types: Cigarettes    Quit date: 12/19/2011  . Smokeless tobacco: Never Used     Comment: started smoking at age 89--4 cigs per day  . Alcohol use Yes     Comment: wine occasionally  . Drug use: No  . Sexual activity: No   Other Topics Concern  . Not on file   Social History Narrative   HS Graduate; [employer].  Married '61.  2 sons - '70, '71; Dtrs - '64,'68;    6 grandchildren.  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 Interests -Tai-chi & water aerobics, gardening, active lifestyle.  Marriage a bit stressful - SO w/membory problems and difficult behavior. She denies any personal safety concerns. End of life care: need to address at next OV             Allergies  Allergen Reactions  . Cholestatin Other (See Comments)    RAGWEED SEASON...sneezing   . Sulfur Itching   Family History  Problem Relation Age of Onset  . Heart disease Father     Past medical history, social, surgical and family history all reviewed in electronic medical record.  No pertanent information unless stated regarding to the chief complaint.   Review of Systems: No headache, visual changes, nausea, vomiting, diarrhea, constipation, dizziness, abdominal pain, skin rash, fevers, chills, night sweats, weight loss, swollen lymph nodes, body aches, joint swelling, muscle aches, chest pain, shortness of breath, mood changes.   Objective  There were no vitals taken for this visit.  General: No apparent distress alert and oriented x3 mood and affect normal, dressed appropriately.  HEENT: Pupils equal, extraocular movements intact    Respiratory: Patient's speak in full sentences and does not appear short of breath  Cardiovascular: No lower extremity edema, non tender, no erythema  Skin: Warm dry intact with no signs of infection or rash on extremities or on axial skeleton.  Abdomen: Soft nontender  Neuro: Cranial nerves II through XII are intact, neurovascularly intact in all extremities with 2+ DTRs and 2+ pulses.  Lymph: No lymphadenopathy of posterior or anterior cervical chain or axillae bilaterally.  Gait antalgic gait  walks with the aid of a cane MSK:  Non tender with full range of motion and good stability and symmetric strength and tone of  elbows, wrist, hip, knee and ankles bilaterally.  Shoulder: Bilateral Severe atrophy noted. Diffuse tenderness bilaterally left couldn't and right Unable to do full testing secondary to pain and significant crepitus Rotator cuff strength to out of 5 on the left side and 3 out of 5 on the right side Speeds and Yergason's tests normal. Positive painful arc and drop arm  Neck: Inspection unremarkable. No palpable stepoffs. Positive Spurling's maneuver. Limited range of motion lacking the last 10 of extension as well as 10 of side bending bilaterally Grip strength and sensation normal in bilateral hands Strength good C4 to T1 distribution No sensory change to C4 to T1 Negative Hoffman sign bilaterally Reflexes normal  Procedure: Real-time Ultrasound Guided Injection of right glenohumeral joint Device: GE Logiq E  Ultrasound guided injection is preferred based studies that show increased duration, increased effect, greater accuracy, decreased procedural pain, increased response rate with ultrasound guided versus blind injection.  Verbal informed consent obtained.  Time-out conducted.  Noted no overlying erythema, induration, or other signs of local infection.  Skin prepped in a sterile fashion.  Local anesthesia: Topical Ethyl chloride.  With sterile technique and  under real time ultrasound guidance:  Joint visualized.  23g 1  inch needle inserted posterior approach. Pictures taken for needle placement. Patient did have injection of 2 cc of 1% lidocaine, 2 cc of 0.5% Marcaine, and 1.0 cc of Kenalog 40 mg/dL. Completed without difficulty  Pain immediately resolved suggesting accurate placement of the medication.  Advised to call if fevers/chills, erythema, induration, drainage, or persistent bleeding.  Images permanently stored and available for review in the ultrasound unit.  Impression: Technically successful ultrasound guided injection.   Procedure: Real-time Ultrasound Guided Injection of left glenohumeral joint Device: GE Logiq E  Ultrasound guided injection is preferred based studies that show increased duration, increased effect, greater accuracy, decreased procedural pain, increased response rate with ultrasound guided versus blind injection.  Verbal informed consent obtained.  Time-out conducted.  Noted no overlying erythema, induration, or other signs of local infection.  Skin prepped in a sterile fashion.  Local anesthesia: Topical Ethyl chloride.  With sterile technique and under real time ultrasound guidance:  Joint visualized.  23g 1  inch needle inserted posterior approach. Pictures taken for needle placement. Patient did have injection of 2 cc of 1% lidocaine, 2 cc of 0.5% Marcaine, and 1cc of Kenalog 40 mg/dL. Completed without difficulty  Pain immediately resolved suggesting accurate placement of the medication.  Advised to call if fevers/chills, erythema, induration, drainage, or persistent bleeding.  Images permanently stored and available for review in the ultrasound unit.  Impression: Technically successful ultrasound guided injection.     Impression and Recommendations:     This case required medical decision making of moderate complexity.      Note: This dictation was prepared with Dragon dictation along with smaller  phrase technology. Any transcriptional errors that result from this process are unintentional.

## 2015-12-09 ENCOUNTER — Encounter: Payer: Self-pay | Admitting: Family Medicine

## 2015-12-09 ENCOUNTER — Other Ambulatory Visit: Payer: Self-pay

## 2015-12-09 ENCOUNTER — Ambulatory Visit (INDEPENDENT_AMBULATORY_CARE_PROVIDER_SITE_OTHER): Payer: Medicare Other | Admitting: Family Medicine

## 2015-12-09 VITALS — BP 108/72 | HR 82 | Wt 170.0 lb

## 2015-12-09 DIAGNOSIS — M12811 Other specific arthropathies, not elsewhere classified, right shoulder: Secondary | ICD-10-CM

## 2015-12-09 DIAGNOSIS — M12812 Other specific arthropathies, not elsewhere classified, left shoulder: Secondary | ICD-10-CM | POA: Diagnosis not present

## 2015-12-09 DIAGNOSIS — M25511 Pain in right shoulder: Secondary | ICD-10-CM

## 2015-12-09 DIAGNOSIS — M75101 Unspecified rotator cuff tear or rupture of right shoulder, not specified as traumatic: Secondary | ICD-10-CM | POA: Insufficient documentation

## 2015-12-09 DIAGNOSIS — M75102 Unspecified rotator cuff tear or rupture of left shoulder, not specified as traumatic: Secondary | ICD-10-CM

## 2015-12-09 MED ORDER — GABAPENTIN 100 MG PO CAPS: 100.0000 mg | ORAL_CAPSULE | Freq: Every day | ORAL | 3 refills | Status: DC

## 2015-12-09 NOTE — Patient Instructions (Signed)
Good to see you.  Ice 20 minutes 2 times daily. Usually after activity and before bed. pennsaid pinkie amount topically 2 times daily as needed.  One prescription- gabapentin '100mg'$  at night Over the counter.  Tylenol 325 mg 3 times a day  Vitamin D 2000 IU  Daily  Turmeric '500mg'$  daily  Keep hands within peripheral vision.  This is arthritis and I will not be able to cure it but if injections help I can repeat every 3 months.  See me again in 6 weeks.

## 2015-12-09 NOTE — Assessment & Plan Note (Signed)
Patient given bilateral injections. Tolerated the procedure well. We discussed icing regimen and home exercises. Given a trial topical anti-inflammatories. We discussed which activities to do an which was to avoid. We'll have to avoid anti-inflammatories and patient being on a blood thinner. Patient declined formal physical therapy. We discussed continuing curative measure for her pain would be shoulder replacement which patient wants to avoid. Follow up with me again in 4 weeks.

## 2015-12-24 DIAGNOSIS — C44319 Basal cell carcinoma of skin of other parts of face: Secondary | ICD-10-CM | POA: Diagnosis not present

## 2015-12-24 DIAGNOSIS — Z85828 Personal history of other malignant neoplasm of skin: Secondary | ICD-10-CM | POA: Diagnosis not present

## 2015-12-26 ENCOUNTER — Other Ambulatory Visit: Payer: Medicare Other

## 2015-12-26 DIAGNOSIS — M25511 Pain in right shoulder: Secondary | ICD-10-CM

## 2016-01-06 ENCOUNTER — Other Ambulatory Visit: Payer: Self-pay | Admitting: Internal Medicine

## 2016-01-06 ENCOUNTER — Other Ambulatory Visit: Payer: Self-pay | Admitting: Nurse Practitioner

## 2016-01-06 ENCOUNTER — Other Ambulatory Visit: Payer: Medicare Other

## 2016-01-06 DIAGNOSIS — E2839 Other primary ovarian failure: Secondary | ICD-10-CM

## 2016-01-06 NOTE — Telephone Encounter (Signed)
Pt has a new rx refill for Atenolol but the phamacy currently has it on back order. Pt is able to get  - Carvedilol or Meoprolol instead she would just need a new rx with the dosage.

## 2016-01-06 NOTE — Telephone Encounter (Signed)
Will send refill request to Dr Caryl Comes and RN to review, advise, and follow-up with.  Pts pharmacy has Atenolol on backorder and Dr Caryl Comes will need to advise on a different regimen in its place, such as Metoprolol or Carvedilol.

## 2016-01-07 ENCOUNTER — Ambulatory Visit (INDEPENDENT_AMBULATORY_CARE_PROVIDER_SITE_OTHER): Payer: Medicare Other | Admitting: Podiatry

## 2016-01-07 ENCOUNTER — Encounter: Payer: Self-pay | Admitting: Podiatry

## 2016-01-07 VITALS — Ht 60.5 in | Wt 171.0 lb

## 2016-01-07 DIAGNOSIS — B351 Tinea unguium: Secondary | ICD-10-CM

## 2016-01-07 DIAGNOSIS — M79676 Pain in unspecified toe(s): Secondary | ICD-10-CM

## 2016-01-07 NOTE — Progress Notes (Addendum)
Patient ID: [name], female   DOB: 02/08/1932, 82 y.o.   MRN: [id number] Complaint:  Visit Type: Patient returns to my office for continued preventative foot care services. Complaint: Patient states" my nails have grown long and thick and become painful to walk and wear shoes" Patient has been diagnosed with DM with no foot complications. The patient presents for preventative foot care services. No changes to ROS  Podiatric Exam: Vascular: dorsalis pedis and posterior tibial pulses are palpable bilateral. Capillary return is immediate. Temperature gradient is WNL. Skin turgor WNL  Sensorium: Normal Semmes Weinstein monofilament test. Normal tactile sensation bilaterally. Nail Exam: Pt has thick disfigured discolored nails with subungual debris noted bilateral entire nail hallux through fifth toenails Ulcer Exam: There is no evidence of ulcer or pre-ulcerative changes or infection. Orthopedic Exam: Muscle tone and strength are WNL. No limitations in general ROM. No crepitus or effusions noted. Foot type and digits show no abnormalities. Bony prominences are unremarkable. Skin: No Porokeratosis. No infection or ulcers  Diagnosis:  Onychomycosis, , Pain in right toe, pain in left toes  Treatment & Plan Procedures and Treatment: Consent by patient was obtained for treatment procedures. The patient understood the discussion of treatment and procedures well. All questions were answered thoroughly reviewed. Debridement of mycotic and hypertrophic toenails, 1 through 5 bilateral and clearing of subungual debris. No ulceration, no infection. Return Visit-Office Procedure: Patient instructed to return to the office for a follow up visit 3 months for continued evaluation and treatment.    Gardiner Barefoot DPM

## 2016-01-09 ENCOUNTER — Ambulatory Visit: Payer: Medicare Other | Admitting: Rehabilitative and Restorative Service Providers"

## 2016-01-09 NOTE — Telephone Encounter (Signed)
Reviewed atenolol back order with Dr. Caryl Comes- he stated that since the patient's SBP was 108 in early October, she could just try staying off of this. I called her today to notify her of this. She reports that Dr. Caryl Comes had put her on this due to her shaking. She was still having some shaking on this medication, but it is worse in the 3 weeks that she has been off of it. I advised I will need to review with Dr. Caryl Comes and call her back with further recommendations. She is agreeable.

## 2016-01-11 ENCOUNTER — Telehealth: Payer: Self-pay | Admitting: Oncology

## 2016-01-11 NOTE — Telephone Encounter (Signed)
MOVED 11/6 LAB/HB/[facility]OM[facility] FROM 11/6 TO 11/27 W/KC AFTER PATIENT HAS BONE DENSITY/MAMMO 11/20.   SPOKE WITH PATIENT RE NEW DATE/TIME.

## 2016-01-13 ENCOUNTER — Ambulatory Visit: Payer: Medicare Other | Admitting: Oncology

## 2016-01-13 ENCOUNTER — Ambulatory Visit: Payer: Medicare Other

## 2016-01-13 ENCOUNTER — Ambulatory Visit: Payer: Medicare Other | Admitting: Nurse Practitioner

## 2016-01-13 ENCOUNTER — Other Ambulatory Visit: Payer: Medicare Other

## 2016-01-15 MED ORDER — METOPROLOL SUCCINATE ER 25 MG PO TB24: 25.0000 mg | ORAL_TABLET | Freq: Every day | ORAL | 3 refills | Status: DC

## 2016-01-15 NOTE — Addendum Note (Signed)
Addended by: Alvis Lemmings C on: 01/15/2016 03:32 PM   Modules accepted: Orders

## 2016-01-15 NOTE — Telephone Encounter (Signed)
I discussed the patient's tremors with Dr. Caryl Comes. Per Dr. Caryl Comes- since atenolol on backorder, then switch to metoprolol succinate 25 mg once daily. I have notified the patient of this and she is agreeable with he change. RX sent to [facility] per her request.

## 2016-01-16 ENCOUNTER — Ambulatory Visit: Payer: Medicare Other | Attending: Internal Medicine | Admitting: Rehabilitative and Restorative Service Providers"

## 2016-01-20 NOTE — Progress Notes (Signed)
[name] [place], [name] [address] Phone: [phone] Subjective:    I'm seeing this patient by the request  of:  Hoyt Koch, MD   CC: Arm pain and shoulder pain left f/u   RNH:[id number]  [name] is a 82 y.o. female coming in with complaint of left arm pain. atient was found to have a chronic fracture on the humerus as well as bilateral rotator cuff arthropathy. Injections 6 weeks ago. Was to do conservative therapy. Patient states no improvement.  Still having pain overall.  Still unable to lift the arms Not taking any of the medicines.     previous imaging: X-rays taken 07/26/2015 show moderate glenohumeral degenerative changes bilaterally with a chronic fracture of the humeral head and surgical neck on the left side  Past Medical History:  Diagnosis Date  . Acute blood loss anemia 04/20/2012  . AF (atrial fibrillation) ([place])     AV ablation 9/09 [place] per Dr Ola Spurr - AV node ablation 9/11 Dr Caryl Comes  . Amiodarone pulmonary toxicity   . Asthma   . Bronchitis    hx of   . [name] (coronary artery disease)    (not sure of this 11/10  . CHF (congestive heart failure) ([facility])   . Chronic renal insufficiency, stage III (moderate)    CrCl about 60 ml/min  . Complication of anesthesia    "psycotic episode" after hip surg - resolved  . COPD (chronic obstructive pulmonary disease) ([facility])    emphysema -FeV1 73% DLCO 53% 5/09  . CVA (cerebral vascular accident) ([place] [facility])    no residual effects evident to pt   . Depression 06/06/2012  . Diabetes ([place])   . Diastolic heart failure    Acute on Chronic  . Eczema   . GERD (gastroesophageal reflux disease)   . History of skin cancer   . Hx of cardiovascular stress test    Lexiscan Myoview (09/2013):  No definite ischemia, EF 67%; low risk  . Hyperlipidemia   . Invasive ductal carcinoma of breast ([facility]) 2011   LEFT   . Mental disorder   . OA (osteoarthritis) of knee    RIGHT   . Pacemaker    Permanent  . PAD (peripheral artery disease) ([facility])    w/hx right iliac/SFA stenting and left and rt leg PTA  . Pain    pt states has pain in fingertips per right hand pt states has been told may be carpal tunnel  . Pneumonia    hx of   . Right sided sciatica   . Shortness of breath dyspnea    walking distances   . Sinoatrial node dysfunction ([facility])   . Sleep apnea    associated with hypersomnia uses O2 2L/M at night and during naps also uses CPAP  . Tobacco abuse   . Tremors of nervous system    hands bilat    Past Surgical History:  Procedure Laterality Date  . ATRIAL ABLATION SURGERY     x 2 - "did not help - had to have pacemaker"  . BREAST LUMPECTOMY     left breast  . CARDIAC CATHETERIZATION    . CATARACT EXTRACTION, BILATERAL     with IOL/Dr Katy Fitch  . EP IMPLANTABLE DEVICE N/A 02/13/2015   Procedure: PPM Generator Changeout-St. Jude device;  Surgeon: Deboraha Sprang, MD;  Location: [facility];  Service: Cardiovascular;  Laterality: N/A;  . MASTECTOMY, PARTIAL  02/03/2010   Left/Dr  Rosenbower  . ORIF ACETABULAR FRACTURE Left 04/19/2012   Procedure: OPEN REDUCTION INTERNAL FIXATION (ORIF) ACETABULAR FRACTURE;  Surgeon: Rozanna Box, MD;  Location: [facility];  Service: Orthopedics;  Laterality: Left;  . sun spot removed      forhead  . TOTAL HIP ARTHROPLASTY Left 10/31/2014   Procedure: LEFT TOTAL HIP ARTHROPLASTY POSTERIOR  APPROACH;  Surgeon: Gaynelle Arabian, MD;  Location: [facility];  Service: Orthopedics;  Laterality: Left;  . TOTAL KNEE ARTHROPLASTY Right 10/16/2013   Procedure: RIGHT TOTAL KNEE ARTHROPLASTY;  Surgeon: Gearlean Alf, MD;  Location: [facility];  Service: Orthopedics;  Laterality: Right;  [facility] VASCULAR SURGERY     both legs    Social History   Social History  . Marital status: Married    Spouse name: N/A  . Number of children: N/A  . Years of education: N/A   Social History Main Topics  . Smoking status: Former Smoker    Packs/day:  1.00    Years: 55.00    Types: Cigarettes    Quit date: 12/19/2011  . Smokeless tobacco: Never Used     Comment: started smoking at age 6--4 cigs per day  . Alcohol use Yes     Comment: wine occasionally  . Drug use: No  . Sexual activity: No   Other Topics Concern  . None   Social History Narrative   HS Graduate; [employer].  Married '61.  2 sons - '70, '71; Dtrs - '64,'68;    6 grandchildren.  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 Interests -Tai-chi & water aerobics, gardening, active lifestyle.  Marriage a bit stressful - SO w/membory problems and difficult behavior. She denies any personal safety concerns. End of life care: need to address at next OV             Allergies  Allergen Reactions  . Cholestatin Other (See Comments)    RAGWEED SEASON...sneezing   . Sulfur Itching   Family History  Problem Relation Age of Onset  . Heart disease Father     Past medical history, social, surgical and family history all reviewed in electronic medical record.  No pertanent information unless stated regarding to the chief complaint.   Review of Systems: No headache, visual changes, nausea, vomiting, diarrhea, constipation, dizziness, abdominal pain, skin rash, fevers, chills, night sweats, weight loss, swollen lymph nodes, body aches, joint swelling, muscle aches, chest pain, shortness of breath, mood changes.   Objective  Blood pressure 108/80, pulse 74, SpO2 92 %.  Systems examined below as of 01/21/16 General: No apparent distress alert and oriented x2 mood and affect normal, dressed appropriately.  HEENT: Pupils equal, extraocular movements intact  Respiratory: Patient's speak in full sentences and does not appear short of breath  Cardiovascular: No lower extremity edema, non tender, no erythema  Skin: Warm dry  intact with no signs of infection or rash on extremities or on axial skeleton.  Abdomen: Soft nontender  Neuro: Cranial nerves II through XII are intact, neurovascularly intact in all extremities with 2+ DTRs and 2+ pulses.  Lymph: No lymphadenopathy of posterior or anterior cervical chain or axillae bilaterally.  Gait antalgic gait walks with the aid of a cane MSK:  Non tender with full range of motion and good stability and symmetric strength and tone of  elbows, wrist, hip, knee and ankles bilaterally. Arthritic  changes of multiple joints.   Shoulder: Bilateral Severe atrophy noted. Diffuse tenderness bilaterally left couldn't and right Unable to do full testing secondary to pain and significant crepitus Rotator cuff strength to out of 5 on the left side and 3 out of 5 on the right side Speeds and Yergason's tests normal. Positive painful arc and drop arm No change   Neck: Inspection unremarkable. No palpable stepoffs. Positive Spurling's maneuver. Still present.  Limited range of motion lacking the last 10 of extension as well as 10 of side bending bilaterally no improvement.  Grip strength and sensation normal in bilateral hands Strength good C4 to T1 distribution No sensory change to C4 to T1 Negative Hoffman sign bilaterally Reflexes normal      Impression and Recommendations:     This case required medical decision making of moderate complexity.      Note: This dictation was prepared with Dragon dictation along with smaller phrase technology. Any transcriptional errors that result from this process are unintentional.

## 2016-01-21 ENCOUNTER — Encounter: Payer: Self-pay | Admitting: Family Medicine

## 2016-01-21 ENCOUNTER — Ambulatory Visit (INDEPENDENT_AMBULATORY_CARE_PROVIDER_SITE_OTHER): Payer: Medicare Other | Admitting: Family Medicine

## 2016-01-21 DIAGNOSIS — M75101 Unspecified rotator cuff tear or rupture of right shoulder, not specified as traumatic: Principal | ICD-10-CM

## 2016-01-21 DIAGNOSIS — M12812 Other specific arthropathies, not elsewhere classified, left shoulder: Secondary | ICD-10-CM

## 2016-01-21 DIAGNOSIS — M75102 Unspecified rotator cuff tear or rupture of left shoulder, not specified as traumatic: Principal | ICD-10-CM

## 2016-01-21 DIAGNOSIS — M12811 Other specific arthropathies, not elsewhere classified, right shoulder: Secondary | ICD-10-CM

## 2016-01-21 NOTE — Patient Instructions (Signed)
Good to see you  Please try the gabapentin '100mg'$  at night I think that would be great  pennsaid pinkie amount topically 2 times daily as needed.  I am sorry for the bad news and that this will not get better See me again in 6 weeks and we can repeat injections.

## 2016-01-21 NOTE — Assessment & Plan Note (Signed)
iscussed with patient again at great length. We discussed on this is something that we'll likely get better. We discussed we can repeat injections every 12 weeks. I would like to not do this any sooner than that. Patient does not remember if she had any significant impairment from the injections anyhow. Was noncompliant on the medications. We discussed trying not on a more regular basis. Patient will do this as well  We discussed lifting mechanics.

## 2016-01-27 ENCOUNTER — Inpatient Hospital Stay: Admission: RE | Admit: 2016-01-27 | Payer: Medicare Other | Source: Ambulatory Visit

## 2016-01-29 ENCOUNTER — Other Ambulatory Visit: Payer: Self-pay | Admitting: *Deleted

## 2016-01-29 DIAGNOSIS — C50212 Malignant neoplasm of upper-inner quadrant of left female breast: Secondary | ICD-10-CM

## 2016-02-03 ENCOUNTER — Ambulatory Visit
Admission: RE | Admit: 2016-02-03 | Discharge: 2016-02-03 | Disposition: A | Discharge status: Home / Self Care | Payer: Medicare Other | Source: Ambulatory Visit | Attending: Nurse Practitioner | Admitting: Nurse Practitioner

## 2016-02-03 ENCOUNTER — Ambulatory Visit (HOSPITAL_BASED_OUTPATIENT_CLINIC_OR_DEPARTMENT_OTHER): Payer: Medicare Other

## 2016-02-03 ENCOUNTER — Encounter: Payer: Self-pay | Admitting: Oncology

## 2016-02-03 ENCOUNTER — Ambulatory Visit
Admission: RE | Admit: 2016-02-03 | Discharge: 2016-02-03 | Disposition: A | Discharge status: Home / Self Care | Payer: Medicare Other | Source: Ambulatory Visit | Attending: Internal Medicine | Admitting: Internal Medicine

## 2016-02-03 ENCOUNTER — Other Ambulatory Visit (HOSPITAL_BASED_OUTPATIENT_CLINIC_OR_DEPARTMENT_OTHER): Payer: Medicare Other

## 2016-02-03 ENCOUNTER — Other Ambulatory Visit: Payer: Self-pay | Admitting: Oncology

## 2016-02-03 ENCOUNTER — Ambulatory Visit (HOSPITAL_BASED_OUTPATIENT_CLINIC_OR_DEPARTMENT_OTHER): Payer: Medicare Other | Admitting: Oncology

## 2016-02-03 VITALS — Temp 98.0°F | Ht 60.5 in | Wt 174.0 lb

## 2016-02-03 DIAGNOSIS — R928 Other abnormal and inconclusive findings on diagnostic imaging of breast: Secondary | ICD-10-CM | POA: Diagnosis not present

## 2016-02-03 DIAGNOSIS — M810 Age-related osteoporosis without current pathological fracture: Secondary | ICD-10-CM

## 2016-02-03 DIAGNOSIS — C50912 Malignant neoplasm of unspecified site of left female breast: Secondary | ICD-10-CM

## 2016-02-03 DIAGNOSIS — Z853 Personal history of malignant neoplasm of breast: Secondary | ICD-10-CM

## 2016-02-03 DIAGNOSIS — C50212 Malignant neoplasm of upper-inner quadrant of left female breast: Secondary | ICD-10-CM

## 2016-02-03 DIAGNOSIS — M8000XA Age-related osteoporosis with current pathological fracture, unspecified site, initial encounter for fracture: Secondary | ICD-10-CM

## 2016-02-03 DIAGNOSIS — E2839 Other primary ovarian failure: Secondary | ICD-10-CM

## 2016-02-03 LAB — CBC WITH DIFFERENTIAL/PLATELET
BASO%: 0.9 % (ref 0.0–2.0)
BASOS ABS: 0.1 10*3/uL (ref 0.0–0.1)
EOS ABS: 0.2 10*3/uL (ref 0.0–0.5)
EOS%: 1.6 % (ref 0.0–7.0)
HEMATOCRIT: 43.7 % (ref 34.8–46.6)
HEMOGLOBIN: 14.2 g/dL (ref 11.6–15.9)
LYMPH#: 2.2 10*3/uL (ref 0.9–3.3)
LYMPH%: 21.8 % (ref 14.0–49.7)
MCH: 29.2 pg (ref 25.1–34.0)
MCHC: 32.6 g/dL (ref 31.5–36.0)
MCV: 89.5 fL (ref 79.5–101.0)
MONO#: 0.7 10*3/uL (ref 0.1–0.9)
MONO%: 7.2 % (ref 0.0–14.0)
NEUT#: 6.9 10*3/uL — ABNORMAL HIGH (ref 1.5–6.5)
NEUT%: 68.5 % (ref 38.4–76.8)
PLATELETS: 251 10*3/uL (ref 145–400)
RBC: 4.88 10*6/uL (ref 3.70–5.45)
RDW: 17.6 % — AB (ref 11.2–14.5)
WBC: 10.1 10*3/uL (ref 3.9–10.3)

## 2016-02-03 LAB — COMPREHENSIVE METABOLIC PANEL
ALBUMIN: 3.3 g/dL — AB (ref 3.5–5.0)
ALK PHOS: 94 U/L (ref 40–150)
ALT: 13 U/L (ref 0–55)
AST: 21 U/L (ref 5–34)
Anion Gap: 12 mEq/L — ABNORMAL HIGH (ref 3–11)
BILIRUBIN TOTAL: 0.44 mg/dL (ref 0.20–1.20)
BUN: 16.2 mg/dL (ref 7.0–26.0)
CALCIUM: 9.8 mg/dL (ref 8.4–10.4)
CO2: 25 mEq/L (ref 22–29)
Chloride: 104 mEq/L (ref 98–109)
Creatinine: 1.2 mg/dL — ABNORMAL HIGH (ref 0.6–1.1)
EGFR: 44 mL/min/{1.73_m2} — AB (ref >90)
Glucose: 153 mg/dl — ABNORMAL HIGH (ref 70–140)
POTASSIUM: 4.2 meq/L (ref 3.5–5.1)
Sodium: 142 mEq/L (ref 136–145)
TOTAL PROTEIN: 7.8 g/dL (ref 6.4–8.3)

## 2016-02-03 MED ORDER — SODIUM CHLORIDE 0.9 % IV SOLN
INTRAVENOUS | Status: DC
  Administered 2016-02-03: 15:00:00 via INTRAVENOUS

## 2016-02-03 MED ORDER — ZOLEDRONIC ACID 4 MG/5ML IV CONC
3.3000 mg | Freq: Once | INTRAVENOUS | Status: AC
  Administered 2016-02-03: 3.3 mg via INTRAVENOUS
  Filled 2016-02-03: qty 4.13

## 2016-02-03 NOTE — Progress Notes (Signed)
ID: [name]   DOB: 1932/01/22  MR#: [id number]  CSN#:[id number]  PCP: Tiffany Koch, MD GYN: SU:  OTHER MD: Tiffany Glaser MD  CHIEF COMPLAINT: left breast cancer   CURRENT TREATMENT: Completed 5 years of letrozole as of November 2016  BREAST CANCER HISTORY: From the original intake note:  [name] had screening mammography September 29th 2011 which showed possible masses in both breasts (the prior mammogram that I have is from October of 2008).  She was recalled for further views on October 11th.  In the right breast, there was an oval circumscribed nodule in the anterior lower outer quadrant which by ultrasound was felt possibly to represent either an intraductal lesion or debris in a dilated cyst.  In the left breast, Tiffany. Sadie [name] found a small spiculated mass at 2 o'clock in the subareolar region which by ultrasound was hypoechoic and measured 1 cm.  The left axilla showed normal axillary lymph node anatomy.  With this information, after appropriate discussion on the same day, Tiffany. Sadie [name] proceeded to aspiration of the right breast cyst which histologically proved to be benign adipose tissue.  Biopsy of the left breast, however ([id number]) showed an invasive ductal carcinoma which was 100% ER positive, 0% PR positive with a low proliferation marker at 7% and no Her-2 amplification with a ratio by CISH of 1.21.    With this information, the patient was referred to Tiffany. Zella [name].  Bilateral breast MRIs could not be obtained because the patient has a pacemaker in place.  So she proceeded to [name] localization lumpectomy of the left breast mass February 03, 2010.  The pathology showed [id number]) a 1.2 cm invasive ductal carcinoma, grade 2, with negative margins and only focal lymphovascular invasion.  Her-2 was repeated on this tissue and was again negative.  Her subsequent history is as detailed below.  INTERVAL HISTORY: [name] returns today for follow up of her  estrogen receptor positive breast cancer. The interval history is unremarkable. She is doing "fine". She recently moved to [facility] in their independent living area. She realized that she could no longer take care of her husband by herself who has dementia. She continues to have arthritis in her bilateral shoulders. She is being followed by sports medicine for this. The patient has a lengthy list of medications which she has reviewed and does not think she is taking many of the medications on the list. Completely sure which medication she is taking in not taking. She has stated that she has been given medications that she does not know what therefore and how to take them. She felt as though the combination of all these medications was "dangerous." She declined to let me remove medications from the list that she did not think that she was taking. From a breast cancer point of view, she denies any changes to her breast. She had her annual mammogram performed this morning.  REVIEW OF SYSTEMS: Aside from the issues noted above a detailed review of systems today was stable.  PAST MEDICAL HISTORY: Past Medical History:  Diagnosis Date  . Acute blood loss anemia 04/20/2012  . AF (atrial fibrillation) ([place])     AV ablation 9/09 [place] per [name] - AV node ablation 9/11 [name]  . Amiodarone pulmonary toxicity   . Asthma   . Bronchitis    hx of   . [name] (coronary artery disease)    (not sure of this 11/10  . CHF (congestive heart failure) ([place])   .  Chronic renal insufficiency, stage III (moderate)    CrCl about 60 ml/min  . Complication of anesthesia    "psycotic episode" after hip surg - resolved  . COPD (chronic obstructive pulmonary disease) (HCC)    emphysema -FeV1 73% DLCO 53% 5/09  . CVA (cerebral vascular accident) ([place])    no residual effects evident to pt   . Depression 06/06/2012  . Diabetes ([place])   . Diastolic heart failure    Acute on Chronic  . Eczema   . GERD  (gastroesophageal reflux disease)   . History of skin cancer   . Hx of cardiovascular stress test    Lexiscan Myoview (09/2013):  No definite ischemia, EF 67%; low risk  . Hyperlipidemia   . Invasive ductal carcinoma of breast ([place]) 2011   LEFT   . Mental disorder   . OA (osteoarthritis) of knee    RIGHT  . Pacemaker    Permanent  . PAD (peripheral artery disease) (HCC)    w/hx right iliac/SFA stenting and left and rt leg PTA  . Pain    pt states has pain in fingertips per right hand pt states has been told may be carpal tunnel  . Pneumonia    hx of   . Right sided sciatica   . Shortness of breath dyspnea    walking distances   . Sinoatrial node dysfunction (HCC)   . Sleep apnea    associated with hypersomnia uses O2 2L/M at night and during naps also uses CPAP  . Tobacco abuse   . Tremors of nervous system    hands bilat     PAST SURGICAL HISTORY: Past Surgical History:  Procedure Laterality Date  . ATRIAL ABLATION SURGERY     x 2 - "did not help - had to have pacemaker"  . BREAST LUMPECTOMY     left breast  . CARDIAC CATHETERIZATION    . CATARACT EXTRACTION, BILATERAL     with IOL/[name]  . EP IMPLANTABLE DEVICE N/A 02/13/2015   Procedure: PPM Generator Changeout-St. Jude device;  Surgeon: Tiffany Sprang, MD;  Location: [facility];  Service: Cardiovascular;  Laterality: N/A;  . MASTECTOMY, PARTIAL  02/03/2010   Left/Tiffany [name]  . ORIF ACETABULAR FRACTURE Left 04/19/2012   Procedure: OPEN REDUCTION INTERNAL FIXATION (ORIF) ACETABULAR FRACTURE;  Surgeon: Tiffany Box, MD;  Location: [facility];  Service: Orthopedics;  Laterality: Left;  . sun spot removed      forhead  . TOTAL HIP ARTHROPLASTY Left 10/31/2014   Procedure: LEFT TOTAL HIP ARTHROPLASTY POSTERIOR  APPROACH;  Surgeon: Tiffany Arabian, MD;  Location: [facility];  Service: Orthopedics;  Laterality: Left;  . TOTAL KNEE ARTHROPLASTY Right 10/16/2013   Procedure: RIGHT TOTAL KNEE ARTHROPLASTY;  Surgeon: Tiffany Alf, MD;  Location: [facility];  Service: Orthopedics;  Laterality: Right;  Marland Kitchen VASCULAR SURGERY     both legs     FAMILY HISTORY Family History  Problem Relation Age of Onset  . Heart disease Father   The patient's father died from myocardial infarction at the age of 28.  The patient's mother died from unclear causes in the setting of COPD and ETOH abuse at the age of 34.  The patient had two sisters.  There is no history of breast or ovarian cancer in the family.   GYNECOLOGIC HISTORY: She is GX P4.  First pregnancy to term she believes was age 48.  The patient underwent the change of life in her late  thirties.  She never took hormone therapy. She does not recall her age at menarche.    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  Her husband, [name], was in industrial supplies.  Her son, [name], is completing a PhD in Psychology . Daughter [name] teaches kindergarten in the [place] area.  Daughter [name] is currently working for a Brewing technologist at [employer].  Son [name] lives in [place] and works at [employer]. The patient has 6 grandchildren, no great grandchildren.  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     ADVANCED DIRECTIVES: in place  HEALTH MAINTENANCE: Social History  Substance Use Topics  . Smoking status: Former Smoker    Packs/day: 1.00    Years: 55.00    Types: Cigarettes    Quit date: 12/19/2011  . Smokeless tobacco: Never Used     Comment: started smoking at age 50--4 cigs per day  . Alcohol use Yes     Comment: wine occasionally     Colonoscopy: due  PAP:  Bone density: T - 1.17 Mar 2010  Lipid panel:  Allergies  Allergen Reactions  . Cholestatin Other (See Comments)    RAGWEED SEASON...sneezing   . Sulfur Itching    Current Outpatient Prescriptions  Medication Sig Dispense Refill  . acetaminophen (TYLENOL) 650 MG CR tablet Take 1,300 mg by mouth every 8 (eight)  hours as needed for pain.    Marland Kitchen atorvastatin (LIPITOR) 20 MG tablet Take 1 tablet (20 mg total) by mouth daily. 90 tablet 3  . B-D ULTRA-FINE 33 LANCETS MISC Use to help check blood sugars daily Dx E11.9 100 each 2  . Biotin (BIOTIN 5000) 5 MG CAPS Take 5 mg by mouth at bedtime.     . calcium carbonate (OS-CAL - DOSED IN MG OF ELEMENTAL CALCIUM) 1250 (500 CA) MG tablet Take 1 tablet by mouth daily with breakfast.    . ELIQUIS 5 MG TABS tablet TAKE 1 TABLET TWICE DAILY  (MUST  ESTABLISH  NEW  PRIMARY CARE PROVIDER  FOR FUTURE REFILLS) 180 tablet 3  . Fluticasone-Salmeterol (ADVAIR) 250-50 MCG/DOSE AEPB Inhale 1 puff into the lungs 2 (two) times daily. 180 each 3  . glucose blood (BAYER CONTOUR TEST) test strip 1 each by Other route daily. Use to check blood sugars every day Dx E11.9 90 each 3  . lidocaine (LIDODERM) 5 % Place 1 patch onto the skin daily. Remove & Discard patch within 12 hours or as directed by MD 30 patch 0  . meloxicam (MOBIC) 7.5 MG tablet Take 1 tablet (7.5 mg total) by mouth 2 (two) times daily. 30 tablet 0  . metFORMIN (GLUCOPHAGE) 500 MG tablet TAKE 1 TABLET TWICE DAILY WITH A MEAL 180 tablet 3  . Multiple Vitamins-Minerals (MULTIVITAMIN WITH MINERALS) tablet Take 1 tablet by mouth 2 (two) times daily.     . potassium chloride SA (K-DUR,KLOR-CON) 20 MEQ tablet Take 40 mEq by mouth 2 (two) times daily.    Marland Kitchen tiotropium (SPIRIVA HANDIHALER) 18 MCG inhalation capsule INHALE THE CONTENTS OF 1 CAPSULE EVERY DAY 90 capsule 3  . torsemide (DEMADEX) 10 MG tablet TAKE 1 TABLET EVERY DAY 90 tablet 1  . cilostazol (PLETAL) 100 MG tablet Take 100 mg by mouth daily.     Marland Kitchen gabapentin (NEURONTIN) 100 MG capsule Take 1 capsule (100 mg total) by mouth at bedtime. (Patient not taking: Reported on 02/03/2016) 30  capsule 3  . ketoconazole (NIZORAL) 2 % cream Apply 1 application topically daily. (Patient not taking: Reported on 02/03/2016) 30 g 6  . levalbuterol (XOPENEX HFA) 45 MCG/ACT inhaler  Inhale 2 puffs into the lungs every 6 (six) hours as needed. Reported on 09/23/2015    . levalbuterol (XOPENEX) 0.63 MG/3ML nebulizer solution Take 3 mLs (0.63 mg total) by nebulization every 6 (six) hours as needed for wheezing or shortness of breath. (Patient not taking: Reported on 02/03/2016) 75 mL 5  . lidocaine (XYLOCAINE) 5 % ointment Apply 1 application topically as needed. (Patient not taking: Reported on 02/03/2016) 2500 g 6  . metoprolol succinate (TOPROL-XL) 25 MG 24 hr tablet Take 1 tablet (25 mg total) by mouth daily. (Patient not taking: Reported on 02/03/2016) 90 tablet 3   No current facility-administered medications for this visit.    Facility-Administered Medications Ordered in Other Visits  Medication Dose Route Frequency Provider Last Rate Last Dose  . 0.9 %  sodium chloride infusion   Intravenous Continuous Chauncey Cruel, MD   Stopped at 02/03/16 1530    OBJECTIVE: Elderly white woman who appears stated age 54:   02/03/16 1338  Temp: 32 F (36.7 C)     Body mass index is 33.42 kg/m.    ECOG FS: 2 Refused BP. O2 sat 98% on RA.  Sclerae anicteric, EOMs intact Oropharynx clear, dentition in good repair No cervical or supraclavicular adenopathy Lungs no rales or rhonchi Heart regular rate and rhythm Abd soft, nontender, positive bowel sounds MSK no focal spinal tenderness, no upper extremity lymphedema Neuro: nonfocal, well oriented, appropriate affect Breasts: Refused breast exam since she just had a mammogram this morning.   LAB RESULTS: Lab Results  Component Value Date   WBC 10.1 02/03/2016   NEUTROABS 6.9 (H) 02/03/2016   HGB 14.2 02/03/2016   HCT 43.7 02/03/2016   MCV 89.5 02/03/2016   PLT 251 02/03/2016      Chemistry      Component Value Date/Time   NA 142 02/03/2016 1314   K 4.2 02/03/2016 1314   CL 103 02/11/2015 1158   CO2 25 02/03/2016 1314   BUN 16.2 02/03/2016 1314   CREATININE 1.2 (H) 02/03/2016 1314      Component Value  Date/Time   CALCIUM 9.8 02/03/2016 1314   ALKPHOS 94 02/03/2016 1314   AST 21 02/03/2016 1314   ALT 13 02/03/2016 1314   BILITOT 0.44 02/03/2016 1314       Lab Results  Component Value Date   LABCA2 22 07/14/2011    No components found for: [id number]  No results for input(s): INR in the last 168 hours.  Urinalysis    Component Value Date/Time   COLORURINE YELLOW 10/24/2014 1433   APPEARANCEUR CLEAR 10/24/2014 1433   LABSPEC 1.007 10/24/2014 1433   PHURINE 5.0 10/24/2014 1433   GLUCOSEU NEGATIVE 10/24/2014 1433   HGBUR NEGATIVE 10/24/2014 1433   BILIRUBINUR NEGATIVE 10/24/2014 1433   KETONESUR NEGATIVE 10/24/2014 1433   PROTEINUR NEGATIVE 10/24/2014 1433   UROBILINOGEN 0.2 10/24/2014 1433   NITRITE NEGATIVE 10/24/2014 1433   LEUKOCYTESUR NEGATIVE 10/24/2014 1433    STUDIES: Dg Bone Density  Result Date: 02/03/2016 EXAM: DUAL X-RAY ABSORPTIOMETRY (DXA) FOR BONE MINERAL DENSITY IMPRESSION: Referring Physician:  Benjamine Mola A CRAWFORD PATIENT: Name: [name], Billing Patient ID: [id number] Birth Date: [birth year] Height: 65.0 in. Sex: Female Measured: 02/03/2016 Weight: 176.0 lbs. Indications: Advanced Age, Breast Cancer History, Caucasian, Estrogen Deficient, Hx of tobacco use Fractures: None  Treatments: Calcium (E943.0) ASSESSMENT: The BMD measured at Femur Total is 0.666 g/cm2 with a T-score of -2.7. This patient is considered osteoporotic according to [facility]) criteria. L-4 was excluded due to degenerative changes. Left femur was excluded due to surgical hardware. Per the official positions of the ISCD, it is not possible to quantitatively compare BMD or calculate an [facility] between exams done at different facilities. Site Region Measured Date Measured Age YA BMD Significant CHANGE T-score Right Femur Total  [age]    [age]         -2.7    0.666 g/cm2 AP Spine    L1-L3  4/03/2016    82.8         -1.2    1.039 g/cm2 World Health Organization [facility]) criteria for  post-menopausal, Caucasian Women: Normal       T-score at or above -1 SD Osteopenia   T-score between -1 and -2.5 SD Osteoporosis T-score at or below -2.5 SD RECOMMENDATION: [place] recommends that FDA-approved medical therapies be considered in postmenopausal women and men age 59 or older with a: 1. Hip or vertebral (clinical or morphometric) fracture. 2. T-score of <-2.5 at the spine or hip. 3. Ten-year fracture probability by FRAX of 3% or greater for hip fracture or 20% or greater for major osteoporotic fracture. All treatment decisions require clinical judgment and consideration of individual patient factors, including patient preferences, co-morbidities, previous drug use, risk factors not captured in the FRAX model (e.g. falls, vitamin D deficiency, increased bone turnover, interval significant decline in bone density) and possible under - or over-estimation of fracture risk by FRAX. All patients should ensure an adequate intake of dietary calcium (1200 mg/d) and vitamin D (800 IU daily) unless contraindicated. FOLLOW-UP: People with diagnosed cases of osteoporosis or at high risk for fracture should have regular bone mineral density tests. For patients eligible for Medicare, routine testing is allowed once every 2 years. The testing frequency can be increased to one year for patients who have rapidly progressing disease, those who are receiving or discontinuing medical therapy to restore bone mass, or have additional risk factors. I have reviewed this report, and agree with the above findings. [facility] Electronically Signed   By: Lowella Grip III M.D.   On: 02/03/2016 10:53   Mm Diag Breast Tomo Bilateral  Result Date: 02/03/2016 CLINICAL DATA:  History of left breast cancer status post lumpectomy in 2011. EXAM: 2D DIGITAL DIAGNOSTIC BILATERAL MAMMOGRAM WITH [name] AND ADJUNCT TOMO COMPARISON:  Previous exam(s). ACR Breast Density Category b: There are scattered  areas of fibroglandular density. FINDINGS: Stable lumpectomy changes are seen in the left breast. No suspicious mass or malignant type microcalcifications identified in either breast. Mammographic images were processed with [name]. IMPRESSION: No evidence of malignancy in either breast. RECOMMENDATION: Bilateral diagnostic mammogram in 1 year is recommended. I have discussed the findings and recommendations with the patient. Results were also provided in writing at the conclusion of the visit. If applicable, a reminder letter will be sent to the patient regarding the next appointment. BI-RADS CATEGORY  2: Benign. Electronically Signed   By: Lillia Mountain M.D.   On: 02/03/2016 11:27   ASSESSMENT: 82 y.o.  [place] woman   (1) status post left lumpectomy November 2011 for a pT1c cN0, stage I invasive ductal carcinoma, grade 2, strongly estrogen receptor positive, with a low proliferation fraction at 7%, progesterone receptor and HER-2 negative, on letrozole since December 2011 with excellent tolerance. Completed  in November 2016.  (2) Osteoporosis - zometa started November 2015, repeated November 2016  PLAN:  [name] completed 5 years of letrozole. Mammogram is stable. DEXA bone scan compared to 2 years ago is stable. She would like to continue to see [name] twice a year as opposed to annually.  We can facilitate this through our survivorship program. She will be seen by them in May 2018 and then follow-up with Tiffany. Jana Hakim in 1 year.  She will receive zolendronate today. She has tolerated that well. She will continue the alendronate annually.  I have recommended that she talk with her other providers for prescribed medications to her. I have encouraged her to ask the reason each medication is prescribed and had to correctly take this medication. I will defer to her other providers to discuss with her the need for current medication showing on her medication list in EPIC.  She has a good understanding of the  overall plan. She agrees with it. She will call with any problems that may develop before her next visit here.   Mikey Bussing    02/03/2016

## 2016-02-03 NOTE — Patient Instructions (Signed)

## 2016-02-05 ENCOUNTER — Encounter: Payer: Self-pay | Admitting: Pulmonary Disease

## 2016-02-05 ENCOUNTER — Ambulatory Visit (INDEPENDENT_AMBULATORY_CARE_PROVIDER_SITE_OTHER): Payer: Medicare Other | Admitting: Pulmonary Disease

## 2016-02-05 VITALS — BP 108/66 | HR 75 | Ht 65.0 in | Wt 177.0 lb

## 2016-02-05 DIAGNOSIS — G4733 Obstructive sleep apnea (adult) (pediatric): Secondary | ICD-10-CM | POA: Diagnosis not present

## 2016-02-05 DIAGNOSIS — J449 Chronic obstructive pulmonary disease, unspecified: Secondary | ICD-10-CM

## 2016-02-05 NOTE — Progress Notes (Signed)
[name]    [id number]    10-Jul-1931  Primary Care Physician:Tiffany Becky [place]a, MD  Referring Physician: Hoyt Koch, MD [address] [place], [place] [address]  Chief complaint:   Follow up for COPD Heavy ex smoker OSA on CPAP with 2Lt O2  HPI: [name] has history of COPD with frequent exacerbations in the past. She is a former patient of [name] and a heavy former smoker. She smoked one and half to 3 packs per day for nearly 60 years. She quit 2013. She is actually improved after quitting in terms of her respiratory status. At last visit she was given doxy and steroids for an exacerbation. She feels well now and back to baseline.  She underwent right hip and total knee replacement in the past. She has apparently tolerated these procedures well. She has history of atrial fibrillation and failed several ablations. She has a permanent pacemaker. 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 She is not on amiodarone at present.  Interim History: She is doing well on her current inhalers. She moved to a new nursing home and feels she may be allergic to the carpets there. She reports mildly increased cough and congestion. She has been off the cigarettes but reports sneaking in 3 cigarettes a day for a couple of weeks over the summer.  Outpatient Encounter Prescriptions as of 02/05/2016  Medication Sig  . acetaminophen (TYLENOL) 650 MG CR tablet Take 1,300 mg by mouth every 8 (eight) hours as needed for pain.  Marland Kitchen atorvastatin (LIPITOR) 20 MG tablet Take 1 tablet (20 mg total) by mouth daily.  . B-D ULTRA-FINE 33 LANCETS MISC Use to help check blood sugars daily Dx E11.9  . Biotin (BIOTIN 5000) 5 MG CAPS Take 5 mg by mouth at bedtime.   . calcium carbonate (OS-CAL - DOSED IN MG OF ELEMENTAL CALCIUM) 1250 (500 CA) MG tablet Take 1 tablet by mouth daily  with breakfast.  . cilostazol (PLETAL) 100 MG tablet Take 100 mg by mouth daily.   Marland Kitchen ELIQUIS 5 MG TABS tablet TAKE 1 TABLET TWICE DAILY  (MUST  ESTABLISH  NEW  PRIMARY CARE PROVIDER  FOR FUTURE REFILLS)  . Fluticasone-Salmeterol (ADVAIR) 250-50 MCG/DOSE AEPB Inhale 1 puff into the lungs 2 (two) times daily.  Marland Kitchen gabapentin (NEURONTIN) 100 MG capsule Take 1 capsule (100 mg total) by mouth at bedtime.  Marland Kitchen glucose blood (BAYER CONTOUR TEST) test strip 1 each by Other route daily. Use to check blood sugars every day Dx E11.9  . ketoconazole (NIZORAL) 2 % cream Apply 1 application topically daily.  Marland Kitchen levalbuterol (XOPENEX) 0.63 MG/3ML nebulizer solution Take 3 mLs (0.63 mg total) by nebulization every 6 (six) hours as needed for wheezing or shortness of breath.  . meloxicam (MOBIC) 7.5 MG tablet Take 1 tablet (7.5 mg total) by mouth 2 (two) times daily.  . metFORMIN (GLUCOPHAGE) 500 MG tablet TAKE 1 TABLET TWICE DAILY WITH A MEAL  . Multiple Vitamins-Minerals (MULTIVITAMIN WITH MINERALS) tablet Take 1 tablet by mouth 2 (two) times daily.   . potassium chloride SA (K-DUR,KLOR-CON) 20 MEQ tablet Take 40 mEq by mouth 2 (two) times daily.  Marland Kitchen tiotropium (SPIRIVA HANDIHALER) 18 MCG inhalation capsule INHALE THE CONTENTS OF 1 CAPSULE EVERY DAY  . torsemide (DEMADEX) 10 MG tablet TAKE 1 TABLET EVERY DAY  .  levalbuterol (XOPENEX HFA) 45 MCG/ACT inhaler Inhale 2 puffs into the lungs every 6 (six) hours as needed. Reported on 09/23/2015  . lidocaine (LIDODERM) 5 % Place 1 patch onto the skin daily. Remove & Discard patch within 12 hours or as directed by MD (Patient not taking: Reported on 02/05/2016)  . lidocaine (XYLOCAINE) 5 % ointment Apply 1 application topically as needed. (Patient not taking: Reported on 02/05/2016)  . metoprolol succinate (TOPROL-XL) 25 MG 24 hr tablet Take 1 tablet (25 mg total) by mouth daily. (Patient not taking: Reported on 02/05/2016)   No facility-administered encounter medications on  file as of 02/05/2016.     Allergies as of 02/05/2016 - Review Complete 02/05/2016  Allergen Reaction Noted  . Cholestatin Other (See Comments) 09/02/2010  . Sulfur Itching 01/13/2013    Past Medical History:  Diagnosis Date  . Acute blood loss anemia 04/20/2012  . AF (atrial fibrillation) ([facility])     AV ablation 9/09 [facility] per Dr Ola Spurr - AV node ablation 9/11 Dr Caryl Comes  . Amiodarone pulmonary toxicity   . Asthma   . Bronchitis    hx of   . [name] (coronary artery disease)    (not sure of this 11/10  . CHF (congestive heart failure) ([facility])   . Chronic renal insufficiency, stage III (moderate)    CrCl about 60 ml/min  . Complication of anesthesia    "psycotic episode" after hip surg - resolved  . COPD (chronic obstructive pulmonary disease) ([facility])    emphysema -FeV1 73% DLCO 53% 5/09  . CVA (cerebral vascular accident) ([facility])    no residual effects evident to pt   . Depression 06/06/2012  . Diabetes ([facility])   . Diastolic heart failure    Acute on Chronic  . Eczema   . GERD (gastroesophageal reflux disease)   . History of skin cancer   . Hx of cardiovascular stress test    Lexiscan Myoview (09/2013):  No definite ischemia, EF 67%; low risk  . Hyperlipidemia   . Invasive ductal carcinoma of breast ([facility]) 2011   LEFT   . Mental disorder   . OA (osteoarthritis) of knee    RIGHT  . Pacemaker    Permanent  . PAD (peripheral artery disease) ([facility])    w/hx right iliac/SFA stenting and left and rt leg PTA  . Pain    pt states has pain in fingertips per right hand pt states has been told may be carpal tunnel  . Pneumonia    hx of   . Right sided sciatica   . Shortness of breath dyspnea    walking distances   . Sinoatrial node dysfunction ([facility])   . Sleep apnea    associated with hypersomnia uses O2 2L/M at night and during naps also uses CPAP  . Tobacco abuse   . Tremors of nervous system    hands bilat     Past Surgical History:  Procedure Laterality Date  . ATRIAL  ABLATION SURGERY     x 2 - "did not help - had to have pacemaker"  . BREAST LUMPECTOMY     left breast  . CARDIAC CATHETERIZATION    . CATARACT EXTRACTION, BILATERAL     with IOL/Dr Katy Fitch  . EP IMPLANTABLE DEVICE N/A 02/13/2015   Procedure: PPM Generator Changeout-St. Jude device;  Surgeon: Deboraha Sprang, MD;  Location: [facility];  Service: Cardiovascular;  Laterality: N/A;  . MASTECTOMY, PARTIAL  02/03/2010   Left/Dr Rosenbower  . ORIF  ACETABULAR FRACTURE Left 04/19/2012   Procedure: OPEN REDUCTION INTERNAL FIXATION (ORIF) ACETABULAR FRACTURE;  Surgeon: Rozanna Box, MD;  Location: [facility];  Service: Orthopedics;  Laterality: Left;  . sun spot removed      forhead  . TOTAL HIP ARTHROPLASTY Left 10/31/2014   Procedure: LEFT TOTAL HIP ARTHROPLASTY POSTERIOR  APPROACH;  Surgeon: Gaynelle Arabian, MD;  Location: [facility];  Service: Orthopedics;  Laterality: Left;  . TOTAL KNEE ARTHROPLASTY Right 10/16/2013   Procedure: RIGHT TOTAL KNEE ARTHROPLASTY;  Surgeon: Gearlean Alf, MD;  Location: [facility];  Service: Orthopedics;  Laterality: Right;  Marland Kitchen VASCULAR SURGERY     both legs     Family History  Problem Relation Age of Onset  . Heart disease Father     Social History   Social History  . Marital status: Married    Spouse name: N/A  . Number of children: N/A  . Years of education: N/A   Occupational History  . Not on file.   Social History Main Topics  . Smoking status: Former Smoker    Packs/day: 1.00    Years: 55.00    Types: Cigarettes    Quit date: 12/19/2011  . Smokeless tobacco: Never Used     Comment: started smoking at age 3--4 cigs per day  . Alcohol use Yes     Comment: wine occasionally  . Drug use: No  . Sexual activity: No   Other Topics Concern  . Not on file   Social History Narrative   HS Graduate; [employer].  Married '61.  2 sons - '70, '71; Dtrs - '64,'68;    6 grandchildren.  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 Interests -Tai-chi & water aerobics, gardening, active lifestyle.  Marriage a bit stressful - SO w/membory problems and difficult behavior. She denies any personal safety concerns. End of life care: need to address at next OV              Review of systems: Review of Systems  Constitutional: Negative for fever and chills.  HENT: Negative.   Eyes: Negative for blurred vision.  Respiratory: as per HPI  Cardiovascular: Negative for chest pain and palpitations.  Gastrointestinal: Negative for vomiting, diarrhea, blood per rectum. Genitourinary: Negative for dysuria, urgency, frequency and hematuria.  Musculoskeletal: Negative for myalgias, back pain and joint pain.  Skin: Negative for itching and rash.  Neurological: Negative for dizziness, tremors, focal weakness, seizures and loss of consciousness.  Endo/Heme/Allergies: Negative for environmental allergies.  Psychiatric/Behavioral: Negative for depression, suicidal ideas and hallucinations.  All other systems reviewed and are negative.  Physical Exam: Blood pressure 108/66, pulse 75, height 5' 5"  (1.651 m), weight 177 lb (80.3 kg), SpO2 98 %. Gen:      No acute distress HEENT:  EOMI, sclera anicteric Neck:     No masses; no thyromegaly Lungs:    Clear to auscultation bilaterally; normal respiratory effort CV:         Regular rate and rhythm; no murmurs Abd:      + bowel sounds; soft, non-tender; no palpable masses, no distension Ext:    No edema; adequate peripheral perfusion Skin:      Warm and dry; no rash Neuro: alert and oriented x 3 Psych: normal mood and affect  Data Reviewed: CXR (10/24/14) No active cardiopulmonary disease. Images reviewed  PFTs (  02/20/10) FVC 1.66 L/S (56%) FEV1 1.289 L/S (59%) F/F 74   Assessment:  #1 COPD. She is doing well with no issues. Stable on spiriva and  advair.  She will continue her current inhalers. There is a question of history of amiodarone pulmonary toxicity but lung imaging is clear and she's been off the amiodarone for many years now. This will not need further follow-up.  #2 Heavy ex smoker. She quit 3 years ago. She's sneaked in a few cigarettes over the summer but is now off them completely.Marland Kitchen She will not qualify for a screening CT program of the chest given her age.  #3 OSA She is compliant with his CPAP with no issues  Plan/Recommendations: - Continue Advair, spiriva - Refer for physical therapy  Follow up in 6 months  Marshell Garfinkel MD [facility] Pager [phone] 02/05/2016, 10:09 AM  CC: Hoyt Koch, *

## 2016-02-05 NOTE — Patient Instructions (Signed)
Continue using her Spiriva and Advair as prescribed. Follow up in clinic in 6 months.

## 2016-02-07 NOTE — Progress Notes (Signed)
Chief Complaint  Patient presents with  . Sinusitis    HPI: [name] 82 y.o. withmultiple medical conditions complex medical disease    Here for  Acute visit Saturday clinic  Has hx copd   And seen by pulmonary  Nov 29th  And stable  now  Here for "sisnus infection" maybe      3 days ago  Began having  Redness  Right side of face  Temple to cheek bone right with puffiness and soreness    3 days   Of sx   Concerned  That sinuses are clogged  And now right face  Red in face and full  Congestion on right gray green and then green thick dc    Not as much  On the left side  .     No fever     Breathing about the same   Cough .  Had derm procedure   Over 3 weeks ago right temple skin removal  But healed .    ROS: See pertinent positives and negatives per HPI. No fever   Has copd  Med problems but  Not  [place]   No difference  .   Doesn't drive  Comes by taxi.   Past Medical History:  Diagnosis Date  . Acute blood loss anemia 04/20/2012  . AF (atrial fibrillation) ([place])     AV ablation 9/09 [place] per Dr Ola Spurr - AV node ablation 9/11 Dr Caryl Comes  . Amiodarone pulmonary toxicity   . Asthma   . Bronchitis    hx of   . [name] (coronary artery disease)    (not sure of this 11/10  . CHF (congestive heart failure) ([place])   . Chronic renal insufficiency, stage III (moderate)    CrCl about 60 ml/min  . Complication of anesthesia    "psycotic episode" after hip surg - resolved  . COPD (chronic obstructive pulmonary disease) ([facility])    emphysema -FeV1 73% DLCO 53% 5/09  . CVA (cerebral vascular accident) ([place])    no residual effects evident to pt   . Depression 06/06/2012  . Diabetes ([place])   . Diastolic heart failure    Acute on Chronic  . Eczema   . GERD (gastroesophageal reflux disease)   . History of skin cancer   . Hx of cardiovascular stress test    Lexiscan Myoview (09/2013):  No definite ischemia, EF 67%; low risk  . Hyperlipidemia   . Invasive ductal carcinoma of breast  ([place]) 2011   LEFT   . Mental disorder   . OA (osteoarthritis) of knee    RIGHT  . Pacemaker    Permanent  . PAD (peripheral artery disease) (H[facility])    w/hx right iliac/SFA stenting and left and rt leg PTA  . Pain    pt states has pain in fingertips per right hand pt states has been told may be carpal tunnel  . Pneumonia    hx of   . Right sided sciatica   . Shortness of breath dyspnea    walking distances   . Sinoatrial node dysfunction (H[facility])   . Sleep apnea    associated with hypersomnia uses O2 2L/M at night and during naps also uses CPAP  . Tobacco abuse   . Tremors of nervous system    hands bilat     Family History  Problem Relation Age of Onset  . Heart disease Father     Social History   Social History  .  Marital status: Married    Spouse name: N/A  . Number of children: N/A  . Years of education: N/A   Social History Main Topics  . Smoking status: Former Smoker    Packs/day: 1.00    Years: 55.00    Types: Cigarettes    Quit date: 12/19/2011  . Smokeless tobacco: Never Used     Comment: started smoking at age 78--4 cigs per day  . Alcohol use Yes     Comment: wine (rare)  . Drug use: No  . Sexual activity: No   Other Topics Concern  . None   Social History Narrative   HS Graduate; [employer].  Married '61.  2 sons - '70, '71; Dtrs - '64,'68;    6 grandchildren.  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 Interests -Tai-chi & water aerobics, gardening, active lifestyle.  Marriage a bit stressful - SO w/membory problems and difficult behavior. She denies any personal safety concerns. End of life care: need to address at next OV              Outpatient Medications Prior to Visit  Medication Sig Dispense Refill  . acetaminophen (TYLENOL) 650 MG CR tablet Take 1,300 mg by mouth every 8 (eight)  hours as needed for pain.    Marland Kitchen atorvastatin (LIPITOR) 20 MG tablet Take 1 tablet (20 mg total) by mouth daily. 90 tablet 3  . B-D ULTRA-FINE 33 LANCETS MISC Use to help check blood sugars daily Dx E11.9 100 each 2  . Biotin (BIOTIN 5000) 5 MG CAPS Take 5 mg by mouth at bedtime.     . calcium carbonate (OS-CAL - DOSED IN MG OF ELEMENTAL CALCIUM) 1250 (500 CA) MG tablet Take 1 tablet by mouth daily with breakfast.    . cilostazol (PLETAL) 100 MG tablet Take 100 mg by mouth daily.     Marland Kitchen ELIQUIS 5 MG TABS tablet TAKE 1 TABLET TWICE DAILY  (MUST  ESTABLISH  NEW  PRIMARY CARE PROVIDER  FOR FUTURE REFILLS) 180 tablet 3  . Fluticasone-Salmeterol (ADVAIR) 250-50 MCG/DOSE AEPB Inhale 1 puff into the lungs 2 (two) times daily. 180 each 3  . gabapentin (NEURONTIN) 100 MG capsule Take 1 capsule (100 mg total) by mouth at bedtime. 30 capsule 3  . glucose blood (BAYER CONTOUR TEST) test strip 1 each by Other route daily. Use to check blood sugars every day Dx E11.9 90 each 3  . ketoconazole (NIZORAL) 2 % cream Apply 1 application topically daily. 30 g 6  . levalbuterol (XOPENEX HFA) 45 MCG/ACT inhaler Inhale 2 puffs into the lungs every 6 (six) hours as needed. Reported on 09/23/2015    . levalbuterol (XOPENEX) 0.63 MG/3ML nebulizer solution Take 3 mLs (0.63 mg total) by nebulization every 6 (six) hours as needed for wheezing or shortness of breath. 75 mL 5  . lidocaine (LIDODERM) 5 % Place 1 patch onto the skin daily. Remove & Discard patch within 12 hours or as directed by MD 30 patch 0  . lidocaine (XYLOCAINE) 5 % ointment Apply 1 application topically as needed. 2500 g 6  . meloxicam (MOBIC) 7.5 MG tablet Take 1 tablet (7.5 mg total) by mouth 2 (two) times daily. 30 tablet 0  . metFORMIN (GLUCOPHAGE) 500 MG tablet TAKE 1 TABLET TWICE DAILY WITH A MEAL 180 tablet 3  . metoprolol succinate (TOPROL-XL) 25  MG 24 hr tablet Take 1 tablet (25 mg total) by mouth daily. 90 tablet 3  . Multiple Vitamins-Minerals  (MULTIVITAMIN WITH MINERALS) tablet Take 1 tablet by mouth 2 (two) times daily.     . potassium chloride SA (K-DUR,KLOR-CON) 20 MEQ tablet Take 40 mEq by mouth 2 (two) times daily.    Marland Kitchen tiotropium (SPIRIVA HANDIHALER) 18 MCG inhalation capsule INHALE THE CONTENTS OF 1 CAPSULE EVERY DAY 90 capsule 3  . torsemide (DEMADEX) 10 MG tablet TAKE 1 TABLET EVERY DAY 90 tablet 1   No facility-administered medications prior to visit.      EXAM:  BP 124/70 (BP Location: Right Arm, Patient Position: Sitting, Cuff Size: Normal)   Pulse 75   Temp 97.7 F (36.5 C) (Oral)   Wt 176 lb (79.8 kg)   SpO2 93%   BMI 29.29 kg/m   Body mass index is 29.29 kg/m.  GENERAL: vitals reviewed and listed above, alert, oriented, appears well hydrated and in no acute distress tired but verbal   No resp distress   Puffy periorbital left more than right but no facial redness    well healing   Temple skin area healing  HEENT: , conjunctiva  clear, no obvious abnormalities on in Skin face  Left patches of keratosis left emple spection of external nose and ears tms nl   Nares  Slight congestion    OP : no lesion edema or exudate  NECK: no obvious masses on inspection palpation  LUNGS: clear to auscultation bilaterally, no wheezes, rales or rhonchi, t CV: HRRR, no clubbing cyanosis  nl cap refill  MS: moves all extremities walks with a cane   Independent  PSYCH: pleasant and cooperative  ASSESSMENT AND PLAN:  Discussed the following assessment and plan:  Other acute sinusitis, recurrence not specified Dramatic sx unilateral  Nasal discharge and  Tender ness face  Same side as surgical  Procedure  3 weeks healed   Better today    Expectant management.   Saline nose pray to continue  And if relapses  Then begin antibiotic as discussed .   Fu skin ? With  Her dermatologist  -Patient advised to return or notify health care team  if symptoms worsen ,persist or new concerns arise.  Patient Instructions  This is probably  a sinusitis  That may get better on its won  Saline nose spray   As often as needed to  [place] out the  Discharge . If getting worse again then begin the antibiotic  As we discussed   Fu with your PCP if not getting better    Sinusitis, Adult Sinusitis is soreness and inflammation of your sinuses. Sinuses are hollow spaces in the bones around your face. Your sinuses are located:  Around your eyes.  In the middle of your forehead.  Behind your nose.  In your cheekbones. Your sinuses and nasal passages are lined with a stringy fluid (mucus). Mucus normally drains out of your sinuses. When your nasal tissues become inflamed or swollen, the mucus can become trapped or blocked so air cannot flow through your sinuses. This allows bacteria, viruses, and funguses to grow, which leads to infection. Sinusitis can develop quickly and last for 7?10 days (acute) or for more than 12 weeks (chronic). Sinusitis often develops after a cold. What are the causes? This condition is caused by anything that creates swelling in the sinuses or stops mucus from draining, including:  Allergies.  Asthma.  Bacterial or viral infection.  Abnormally shaped bones between the nasal passages.  Nasal growths that contain mucus (nasal polyps).  Narrow sinus openings.  Pollutants, such as chemicals or irritants in the air.  A foreign object stuck in the nose.  A fungal infection. This is rare. What increases the risk? The following factors may make you more likely to develop this condition:  Having allergies or asthma.  Having had a recent cold or respiratory tract infection.  Having structural deformities or blockages in your nose or sinuses.  Having a weak immune system.  Doing a lot of swimming or diving.  Overusing nasal sprays.  Smoking. What are the signs or symptoms? The main symptoms of this condition are pain and a feeling of pressure around the affected sinuses. Other symptoms  include:  Upper toothache.  Earache.  Headache.  Bad breath.  Decreased sense of smell and taste.  A cough that may get worse at night.  Fatigue.  Fever.  Thick drainage from your nose. The drainage is often green and it may contain pus (purulent).  Stuffy nose or congestion.  Postnasal drip. This is when extra mucus collects in the throat or back of the nose.  Swelling and warmth over the affected sinuses.  Sore throat.  Sensitivity to light. How is this diagnosed? This condition is diagnosed based on symptoms, a medical history, and a physical exam. To find out if your condition is acute or chronic, your health care provider may:  Look in your nose for signs of nasal polyps.  Tap over the affected sinus to check for signs of infection.  View the inside of your sinuses using an imaging device that has a light attached (endoscope). If your health care provider suspects that you have chronic sinusitis, you may also:  Be tested for allergies.  Have a sample of mucus taken from your nose (nasal culture) and checked for bacteria.  Have a mucus sample examined to see if your sinusitis is related to an allergy. If your sinusitis does not respond to treatment and it lasts longer than 8 weeks, you may have an MRI or CT scan to check your sinuses. These scans also help to determine how severe your infection is. In rare cases, a bone biopsy may be done to rule out more serious types of fungal sinus disease. How is this treated? Treatment for sinusitis depends on the cause and whether your condition is chronic or acute. If a virus is causing your sinusitis, your symptoms will go away on their own within 10 days. You may be given medicines to relieve your symptoms, including:  Topical nasal decongestants. They shrink swollen nasal passages and let mucus drain from your sinuses.  Antihistamines. These drugs block inflammation that is triggered by allergies. This can help to ease  swelling in your nose and sinuses.  Topical nasal corticosteroids. These are nasal sprays that ease inflammation and swelling in your nose and sinuses.  Nasal saline washes. These rinses can help to get rid of thick mucus in your nose. If your condition is caused by bacteria, you will be given an antibiotic medicine. If your condition is caused by a fungus, you will be given an antifungal medicine. Surgery may be needed to correct underlying conditions, such as narrow nasal passages. Surgery may also be needed to remove polyps. Follow these instructions at home: Medicines  Take, use, or apply over-the-counter and prescription medicines only as told by your health care provider. These may include nasal sprays.  If you were  prescribed an antibiotic medicine, take it as told by your health care provider. Do not stop taking the antibiotic even if you start to feel better. Hydrate and Humidify  Drink enough water to keep your urine clear or pale yellow. Staying hydrated will help to thin your mucus.  Use a cool mist humidifier to keep the humidity level in your home above 50%.  Inhale steam for 10-15 minutes, 3-4 times a day or as told by your health care provider. You can do this in the bathroom while a hot shower is running.  Limit your exposure to cool or dry air. Rest  Rest as much as possible.  Sleep with your head raised (elevated).  Make sure to get enough sleep each night. General instructions  Apply a warm, moist washcloth to your face 3-4 times a day or as told by your health care provider. This will help with discomfort.  Wash your hands often with soap and water to reduce your exposure to viruses and other germs. If soap and water are not available, use hand sanitizer.  Do not smoke. Avoid being around people who are smoking (secondhand smoke).  Keep all follow-up visits as told by your health care provider. This is important. Contact a health care provider if:  You  have a fever.  Your symptoms get worse.  Your symptoms do not improve within 10 days. Get help right away if:  You have a severe headache.  You have persistent vomiting.  You have pain or swelling around your face or eyes.  You have vision problems.  You develop confusion.  Your neck is stiff.  You have trouble breathing. This information is not intended to replace advice given to you by your health care provider. Make sure you discuss any questions you have with your health care provider. Document Released: 02/23/2005 Document Revised: 10/20/2015 Document Reviewed: 12/19/2014 Elsevier Interactive Patient Education  2017 [place] K. Dennis Killilea M.D.

## 2016-02-08 ENCOUNTER — Ambulatory Visit (INDEPENDENT_AMBULATORY_CARE_PROVIDER_SITE_OTHER): Payer: Medicare Other | Admitting: Internal Medicine

## 2016-02-08 ENCOUNTER — Encounter: Payer: Self-pay | Admitting: Internal Medicine

## 2016-02-08 VITALS — BP 124/70 | HR 75 | Temp 97.7°F | Wt 176.0 lb

## 2016-02-08 DIAGNOSIS — J018 Other acute sinusitis: Secondary | ICD-10-CM

## 2016-02-08 MED ORDER — AMOXICILLIN-POT CLAVULANATE 875-125 MG PO TABS: 1.0000 | ORAL_TABLET | Freq: Two times a day (BID) | ORAL | 0 refills | Status: DC

## 2016-02-08 NOTE — Patient Instructions (Signed)
This is probably a sinusitis  That may get better on its won  Saline nose spray   As often as needed to  [place] out the  Discharge . If getting worse again then begin the antibiotic  As we discussed   Fu with your PCP if not getting better    Sinusitis, Adult Sinusitis is soreness and inflammation of your sinuses. Sinuses are hollow spaces in the bones around your face. Your sinuses are located:  Around your eyes.  In the middle of your forehead.  Behind your nose.  In your cheekbones. Your sinuses and nasal passages are lined with a stringy fluid (mucus). Mucus normally drains out of your sinuses. When your nasal tissues become inflamed or swollen, the mucus can become trapped or blocked so air cannot flow through your sinuses. This allows bacteria, viruses, and funguses to grow, which leads to infection. Sinusitis can develop quickly and last for 7?10 days (acute) or for more than 12 weeks (chronic). Sinusitis often develops after a cold. What are the causes? This condition is caused by anything that creates swelling in the sinuses or stops mucus from draining, including:  Allergies.  Asthma.  Bacterial or viral infection.  Abnormally shaped bones between the nasal passages.  Nasal growths that contain mucus (nasal polyps).  Narrow sinus openings.  Pollutants, such as chemicals or irritants in the air.  A foreign object stuck in the nose.  A fungal infection. This is rare. What increases the risk? The following factors may make you more likely to develop this condition:  Having allergies or asthma.  Having had a recent cold or respiratory tract infection.  Having structural deformities or blockages in your nose or sinuses.  Having a weak immune system.  Doing a lot of swimming or diving.  Overusing nasal sprays.  Smoking. What are the signs or symptoms? The main symptoms of this condition are pain and a feeling of pressure around the affected sinuses. Other  symptoms include:  Upper toothache.  Earache.  Headache.  Bad breath.  Decreased sense of smell and taste.  A cough that may get worse at night.  Fatigue.  Fever.  Thick drainage from your nose. The drainage is often green and it may contain pus (purulent).  Stuffy nose or congestion.  Postnasal drip. This is when extra mucus collects in the throat or back of the nose.  Swelling and warmth over the affected sinuses.  Sore throat.  Sensitivity to light. How is this diagnosed? This condition is diagnosed based on symptoms, a medical history, and a physical exam. To find out if your condition is acute or chronic, your health care provider may:  Look in your nose for signs of nasal polyps.  Tap over the affected sinus to check for signs of infection.  View the inside of your sinuses using an imaging device that has a light attached (endoscope). If your health care provider suspects that you have chronic sinusitis, you may also:  Be tested for allergies.  Have a sample of mucus taken from your nose (nasal culture) and checked for bacteria.  Have a mucus sample examined to see if your sinusitis is related to an allergy. If your sinusitis does not respond to treatment and it lasts longer than 8 weeks, you may have an MRI or CT scan to check your sinuses. These scans also help to determine how severe your infection is. In rare cases, a bone biopsy may be done to rule out more serious types  of fungal sinus disease. How is this treated? Treatment for sinusitis depends on the cause and whether your condition is chronic or acute. If a virus is causing your sinusitis, your symptoms will go away on their own within 10 days. You may be given medicines to relieve your symptoms, including:  Topical nasal decongestants. They shrink swollen nasal passages and let mucus drain from your sinuses.  Antihistamines. These drugs block inflammation that is triggered by allergies. This can  help to ease swelling in your nose and sinuses.  Topical nasal corticosteroids. These are nasal sprays that ease inflammation and swelling in your nose and sinuses.  Nasal saline washes. These rinses can help to get rid of thick mucus in your nose. If your condition is caused by bacteria, you will be given an antibiotic medicine. If your condition is caused by a fungus, you will be given an antifungal medicine. Surgery may be needed to correct underlying conditions, such as narrow nasal passages. Surgery may also be needed to remove polyps. Follow these instructions at home: Medicines  Take, use, or apply over-the-counter and prescription medicines only as told by your health care provider. These may include nasal sprays.  If you were prescribed an antibiotic medicine, take it as told by your health care provider. Do not stop taking the antibiotic even if you start to feel better. Hydrate and Humidify  Drink enough water to keep your urine clear or pale yellow. Staying hydrated will help to thin your mucus.  Use a cool mist humidifier to keep the humidity level in your home above 50%.  Inhale steam for 10-15 minutes, 3-4 times a day or as told by your health care provider. You can do this in the bathroom while a hot shower is running.  Limit your exposure to cool or dry air. Rest  Rest as much as possible.  Sleep with your head raised (elevated).  Make sure to get enough sleep each night. General instructions  Apply a warm, moist washcloth to your face 3-4 times a day or as told by your health care provider. This will help with discomfort.  Wash your hands often with soap and water to reduce your exposure to viruses and other germs. If soap and water are not available, use hand sanitizer.  Do not smoke. Avoid being around people who are smoking (secondhand smoke).  Keep all follow-up visits as told by your health care provider. This is important. Contact a health care provider  if:  You have a fever.  Your symptoms get worse.  Your symptoms do not improve within 10 days. Get help right away if:  You have a severe headache.  You have persistent vomiting.  You have pain or swelling around your face or eyes.  You have vision problems.  You develop confusion.  Your neck is stiff.  You have trouble breathing. This information is not intended to replace advice given to you by your health care provider. Make sure you discuss any questions you have with your health care provider. Document Released: 02/23/2005 Document Revised: 10/20/2015 Document Reviewed: 12/19/2014 Elsevier Interactive Patient Education  2017 [employer].

## 2016-02-08 NOTE — Progress Notes (Signed)
Pre visit review using our clinic review tool, if applicable. No additional management support is needed unless otherwise documented below in the visit note. 

## 2016-02-14 ENCOUNTER — Telehealth: Payer: Self-pay | Admitting: Oncology

## 2016-02-14 NOTE — Telephone Encounter (Signed)
Survivorship appointment provider and time changed, per [place]. Appointment was confirmed with patient. 02/14/16

## 2016-02-19 ENCOUNTER — Ambulatory Visit: Payer: Medicare Other | Admitting: Physical Therapy

## 2016-02-19 DIAGNOSIS — L570 Actinic keratosis: Secondary | ICD-10-CM | POA: Diagnosis not present

## 2016-02-19 DIAGNOSIS — Z85828 Personal history of other malignant neoplasm of skin: Secondary | ICD-10-CM | POA: Diagnosis not present

## 2016-02-21 ENCOUNTER — Ambulatory Visit (INDEPENDENT_AMBULATORY_CARE_PROVIDER_SITE_OTHER): Payer: Medicare Other | Admitting: Internal Medicine

## 2016-02-21 VITALS — HR 74 | Ht 65.0 in | Wt 180.0 lb

## 2016-02-21 DIAGNOSIS — R001 Bradycardia, unspecified: Secondary | ICD-10-CM | POA: Diagnosis not present

## 2016-02-21 DIAGNOSIS — Z950 Presence of cardiac pacemaker: Secondary | ICD-10-CM | POA: Diagnosis not present

## 2016-02-21 DIAGNOSIS — I481 Persistent atrial fibrillation: Secondary | ICD-10-CM

## 2016-02-21 DIAGNOSIS — I442 Atrioventricular block, complete: Secondary | ICD-10-CM | POA: Diagnosis not present

## 2016-02-21 DIAGNOSIS — I4819 Other persistent atrial fibrillation: Secondary | ICD-10-CM

## 2016-02-21 NOTE — Patient Instructions (Signed)
Medication Instructions: - Your physician recommends that you continue on your current medications as directed. Please refer to the Current Medication list given to you today.  Labwork: - none ordered  Procedures/Testing: - none ordered  Follow-Up: - Remote monitoring is used to monitor your Pacemaker of ICD from home. This monitoring reduces the number of office visits required to check your device to one time per year. It allows [name] to keep an eye on the functioning of your device to ensure it is working properly. You are scheduled for a device check from home on 05/25/16. You may send your transmission at any time that day. If you have a wireless device, the transmission will be sent automatically. After your physician reviews your transmission, you will receive a postcard with your next transmission date.  - Your physician wants you to follow-up in: 1 year with Dr. Caryl Comes. You will receive a reminder letter in the mail two months in advance. If you don't receive a letter, please call our office to schedule the follow-up appointment.  Any Additional Special Instructions Will Be Listed Below (If Applicable).     If you need a refill on your cardiac medications before your next appointment, please call your pharmacy.

## 2016-02-21 NOTE — Progress Notes (Signed)
Patient Care Team: Hoyt Koch, MD as PCP - General (Internal Medicine) Clent Jacks, MD (Ophthalmology) Elsie Stain, MD (Pulmonary Disease) Deboraha Sprang, MD (Cardiology) Jackolyn Confer, MD (General Surgery) Chauncey Cruel, MD (Hematology and Oncology) Mauricia Area, MD (Nephrology) Particia Nearing, MD (Dermatology) Laurence Spates, MD (Gastroenterology) Almedia Balls, MD (Orthopedic Surgery)   HPI  [name] is a 82 y.o. female Seen in followup for a pacemaker implanted for bradycardia. She antecedent history of atrial fibrillation for which she underwent pulmonary vein ablation first at [facility]. Recurrent atrial arrhythmias more frequent symptomatic and she underwent junction ablation with significant interval improvement.    She underwent junction generator replacement 2016  She reminds me that there was a problem with doing of her procedure vis a vis delay  TERF stroke-2 age-66 gender-1 HTN -1 DM-1 for a CHADS-VASc score of greater than or equal to 6   Echo normal LV function 7/15; Myoview scan 7/15 normal LV function and no ischemia matched defects question artifact  Past Medical History:  Diagnosis Date  . Acute blood loss anemia 04/20/2012  . AF (atrial fibrillation) ([place])     AV ablation 9/09 [place] per Dr Ola Spurr - AV node ablation 9/11 Dr Caryl Comes  . Amiodarone pulmonary toxicity   . Asthma   . Bronchitis    hx of   . [name] (coronary artery disease)    (not sure of this 11/10  . CHF (congestive heart failure) ([place])   . Chronic renal insufficiency, stage III (moderate)    CrCl about 60 ml/min  . Complication of anesthesia    "psycotic episode" after hip surg - resolved  . COPD (chronic obstructive pulmonary disease) (HCC)    emphysema -FeV1 73% DLCO 53% 5/09  . CVA (cerebral vascular accident) ([place])    no residual effects evident to pt   . Depression 06/06/2012  . Diabetes ([place])   . Diastolic heart failure    Acute on Chronic    . Eczema   . GERD (gastroesophageal reflux disease)   . History of skin cancer   . Hx of cardiovascular stress test    Lexiscan Myoview (09/2013):  No definite ischemia, EF 67%; low risk  . Hyperlipidemia   . Invasive ductal carcinoma of breast ([place]) 2011   LEFT   . Mental disorder   . OA (osteoarthritis) of knee    RIGHT  . Pacemaker    Permanent  . PAD (peripheral artery disease) (HCC)    w/hx right iliac/SFA stenting and left and rt leg PTA  . Pain    pt states has pain in fingertips per right hand pt states has been told may be carpal tunnel  . Pneumonia    hx of   . Right sided sciatica   . Shortness of breath dyspnea    walking distances   . Sinoatrial node dysfunction (HCC)   . Sleep apnea    associated with hypersomnia uses O2 2L/M at night and during naps also uses CPAP  . Tobacco abuse   . Tremors of nervous system    hands bilat     Past Surgical History:  Procedure Laterality Date  . ATRIAL ABLATION SURGERY     x 2 - "did not help - had to have pacemaker"  . BREAST LUMPECTOMY     left breast  . CARDIAC CATHETERIZATION    . CATARACT EXTRACTION, BILATERAL     with IOL/Dr Katy Fitch  . EP  IMPLANTABLE DEVICE N/A 02/13/2015   Procedure: PPM Generator Changeout-St. Jude device;  Surgeon: Deboraha Sprang, MD;  Location: [facility] [facility];  Service: Cardiovascular;  Laterality: N/A;  . MASTECTOMY, PARTIAL  02/03/2010   Left/Dr Rosenbower  . ORIF ACETABULAR FRACTURE Left 04/19/2012   Procedure: OPEN REDUCTION INTERNAL FIXATION (ORIF) ACETABULAR FRACTURE;  Surgeon: Rozanna Box, MD;  Location: [place];  Service: Orthopedics;  Laterality: Left;  . sun spot removed      forhead  . TOTAL HIP ARTHROPLASTY Left 10/31/2014   Procedure: LEFT TOTAL HIP ARTHROPLASTY POSTERIOR  APPROACH;  Surgeon: Gaynelle Arabian, MD;  Location: [facility];  Service: Orthopedics;  Laterality: Left;  . TOTAL KNEE ARTHROPLASTY Right 10/16/2013   Procedure: RIGHT TOTAL KNEE ARTHROPLASTY;  Surgeon: Gearlean Alf, MD;  Location: [facility];  Service: Orthopedics;  Laterality: Right;  Marland Kitchen VASCULAR SURGERY     both legs     Current Outpatient Prescriptions  Medication Sig Dispense Refill  . acetaminophen (TYLENOL) 650 MG CR tablet Take 1,300 mg by mouth every 8 (eight) hours as needed for pain.    Marland Kitchen amoxicillin-clavulanate (AUGMENTIN) 875-125 MG tablet Take 1 tablet by mouth every 12 (twelve) hours. 14 tablet 0  . atorvastatin (LIPITOR) 20 MG tablet Take 1 tablet (20 mg total) by mouth daily. 90 tablet 3  . B-D ULTRA-FINE 33 LANCETS MISC Use to help check blood sugars daily Dx E11.9 100 each 2  . Biotin (BIOTIN 5000) 5 MG CAPS Take 5 mg by mouth at bedtime.     Marland Kitchen ELIQUIS 5 MG TABS tablet TAKE 1 TABLET TWICE DAILY  (MUST  ESTABLISH  NEW  PRIMARY CARE PROVIDER  FOR FUTURE REFILLS) 180 tablet 3  . Fluticasone-Salmeterol (ADVAIR) 250-50 MCG/DOSE AEPB Inhale 1 puff into the lungs 2 (two) times daily. 180 each 3  . gabapentin (NEURONTIN) 100 MG capsule Take 1 capsule (100 mg total) by mouth at bedtime. 30 capsule 3  . glucose blood (BAYER CONTOUR TEST) test strip 1 each by Other route daily. Use to check blood sugars every day Dx E11.9 90 each 3  . ketoconazole (NIZORAL) 2 % cream Apply 1 application topically daily. 30 g 6  . levalbuterol (XOPENEX HFA) 45 MCG/ACT inhaler Inhale 2 puffs into the lungs every 6 (six) hours as needed. Reported on 09/23/2015    . levalbuterol (XOPENEX) 0.63 MG/3ML nebulizer solution Take 3 mLs (0.63 mg total) by nebulization every 6 (six) hours as needed for wheezing or shortness of breath. 75 mL 5  . meloxicam (MOBIC) 7.5 MG tablet Take 1 tablet (7.5 mg total) by mouth 2 (two) times daily. 30 tablet 0  . metFORMIN (GLUCOPHAGE) 500 MG tablet TAKE 1 TABLET TWICE DAILY WITH A MEAL 180 tablet 3  . Multiple Vitamins-Minerals (MULTIVITAMIN WITH MINERALS) tablet Take 1 tablet by mouth 2 (two) times daily.     . potassium chloride SA (K-DUR,KLOR-CON) 20 MEQ tablet Take 40 mEq by  mouth 2 (two) times daily.    Marland Kitchen tiotropium (SPIRIVA HANDIHALER) 18 MCG inhalation capsule INHALE THE CONTENTS OF 1 CAPSULE EVERY DAY 90 capsule 3  . torsemide (DEMADEX) 10 MG tablet TAKE 1 TABLET EVERY DAY 90 tablet 1   No current facility-administered medications for this visit.     Allergies  Allergen Reactions  . Cholestatin Other (See Comments)    RAGWEED SEASON...sneezing   . Sulfur Itching    Review of Systems negative except from HPI and PMH  Physical Exam Pulse 74  Ht '5\' 5"'$  (1.651 m)   Wt 180 lb (81.6 kg)   SpO2 98%   BMI 29.95 kg/m  Well developed and well nourished in no acute distress HENT normal E scleral and icterus clear Neck Supple JVP flat; carotids brisk and full Clear to ausculation Device pocket well healed; without hematoma or erythema.  There is no tethering Regular rate and rhythm, no murmurs gallops or rub Soft with active bowel sounds No clubbing cyanosis  Edema Alert and oriented, grossly normal motor and sensory function  Walking w a cane Skin Warm and Dry  ECG demonstrates V pacing with AV dissociation   Assessment and  Plan  Atrial fibrillation-permanent  Sinus bradycardia  Complete heart block S./P. AV junction ablation  COPD  Cardiomyopathy  Pacemaker-St. Jude    She is stable post pacing  She has AV dissociation but is of no consequence

## 2016-03-03 ENCOUNTER — Telehealth: Payer: Self-pay | Admitting: Internal Medicine

## 2016-03-03 NOTE — Telephone Encounter (Signed)
Tried to get in touch with patient in regard to automated cancellation list.

## 2016-03-03 NOTE — Progress Notes (Deleted)
[name] [address], [place] [address] Phone: [phone] Subjective:    I'm seeing this patient by the request  of:  Hoyt Koch, MD   CC: Arm pain and shoulder pain left f/u   [id number]  [name] is a 82 y.o. female coming in with complaint of left arm pain. atient was found to have a chronic fracture on the humerus as well as bilateral rotator cuff arthropathy. Last injection was 12/09/2015. Patient states.     previous imaging: X-rays taken 07/26/2015 show moderate glenohumeral degenerative changes bilaterally with a chronic fracture of the humeral head and surgical neck on the left side  Past Medical History:  Diagnosis Date  . Acute blood loss anemia 04/20/2012  . AF (atrial fibrillation) ([place])     AV ablation 9/09 [place] per Dr Ola Spurr - AV node ablation 9/11 Dr Caryl Comes  . Amiodarone pulmonary toxicity   . Asthma   . Bronchitis    hx of   . [name] (coronary artery disease)    (not sure of this 11/10  . CHF (congestive heart failure) ([place])   . Chronic renal insufficiency, stage III (moderate)    CrCl about 60 ml/min  . Complication of anesthesia    "psycotic episode" after hip surg - resolved  . COPD (chronic obstructive pulmonary disease) (HCC)    emphysema -FeV1 73% DLCO 53% 5/09  . CVA (cerebral vascular accident) ([place])    no residual effects evident to pt   . Depression 06/06/2012  . Diabetes ([place])   . Diastolic heart failure    Acute on Chronic  . Eczema   . GERD (gastroesophageal reflux disease)   . History of skin cancer   . Hx of cardiovascular stress test    Lexiscan Myoview (09/2013):  No definite ischemia, EF 67%; low risk  . Hyperlipidemia   . Invasive ductal carcinoma of breast ([place]) 2011   LEFT   . Mental disorder   . OA (osteoarthritis) of knee    RIGHT  . Pacemaker    Permanent  . PAD (peripheral artery disease) (HCC)    w/hx right iliac/SFA stenting and left and rt leg PTA  .  Pain    pt states has pain in fingertips per right hand pt states has been told may be carpal tunnel  . Pneumonia    hx of   . Right sided sciatica   . Shortness of breath dyspnea    walking distances   . Sinoatrial node dysfunction (HCC)   . Sleep apnea    associated with hypersomnia uses O2 2L/M at night and during naps also uses CPAP  . Tobacco abuse   . Tremors of nervous system    hands bilat    Past Surgical History:  Procedure Laterality Date  . ATRIAL ABLATION SURGERY     x 2 - "did not help - had to have pacemaker"  . BREAST LUMPECTOMY     left breast  . CARDIAC CATHETERIZATION    . CATARACT EXTRACTION, BILATERAL     with IOL/Dr Katy Fitch  . EP IMPLANTABLE DEVICE N/A 02/13/2015   Procedure: PPM Generator Changeout-St. Jude device;  Surgeon: Deboraha Sprang, MD;  Location: [facility];  Service: Cardiovascular;  Laterality: N/A;  . MASTECTOMY, PARTIAL  02/03/2010   Left/Dr Rosenbower  . ORIF ACETABULAR FRACTURE Left 04/19/2012   Procedure: OPEN REDUCTION INTERNAL FIXATION (ORIF) ACETABULAR FRACTURE;  Surgeon: Rozanna Box, MD;  Location: [facility];  Service: Orthopedics;  Laterality: Left;  . sun spot removed      forhead  . TOTAL HIP ARTHROPLASTY Left 10/31/2014   Procedure: LEFT TOTAL HIP ARTHROPLASTY POSTERIOR  APPROACH;  Surgeon: Gaynelle Arabian, MD;  [facility];  Service: Orthopedics;  Laterality: Left;  . TOTAL KNEE ARTHROPLASTY Right 10/16/2013   Procedure: RIGHT TOTAL KNEE ARTHROPLASTY;  Surgeon: Gearlean Alf, MD;  [facility];  Service: Orthopedics;  Laterality: Right;  Marland Kitchen VASCULAR SURGERY     both legs    Social History   Social History  . Marital status: Married    Spouse name: N/A  . Number of children: N/A  . Years of education: N/A   Social History Main Topics  . Smoking status: Former Smoker    Packs/day: 1.00    Years: 55.00    Types: Cigarettes    Quit date: 12/19/2011  . Smokeless tobacco: Never Used     Comment: started smoking  at age 83--4 cigs per day  . Alcohol use Yes     Comment: wine (rare)  . Drug use: No  . Sexual activity: No   Other Topics Concern  . Not on file   Social History Narrative   HS Graduate; [employer].  Married '61.  2 sons - '70, '71; Dtrs - '64,'68;    6 grandchildren.  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 Interests -Tai-chi & water aerobics, gardening, active lifestyle.  Marriage a bit stressful - SO w/membory problems and difficult behavior. She denies any personal safety concerns. End of life care: need to address at next OV             Allergies  Allergen Reactions  . Cholestatin Other (See Comments)    RAGWEED SEASON...sneezing   . Sulfur Itching   Family History  Problem Relation Age of Onset  . Heart disease Father     Past medical history, social, surgical and family history all reviewed in electronic medical record.  No pertanent information unless stated regarding to the chief complaint.   Review of Systems: No headache, visual changes, nausea, vomiting, diarrhea, constipation, dizziness, abdominal pain, skin rash, fevers, chills, night sweats, weight loss, swollen lymph nodes, body aches, joint swelling, muscle aches, chest pain, shortness of breath, mood changes.   Objective  There were no vitals taken for this visit.  Systems examined below as of 03/03/16 General: No apparent distress alert and oriented x2 mood and affect normal, dressed appropriately.  HEENT: Pupils equal, extraocular movements intact  Respiratory: Patient's speak in full sentences and does not appear short of breath  Cardiovascular: No lower extremity edema, non tender, no erythema  Skin: Warm dry intact with no signs of infection or rash on extremities or on axial skeleton.  Abdomen: Soft nontender  Neuro: Cranial nerves II  through XII are intact, neurovascularly intact in all extremities with 2+ DTRs and 2+ pulses.  Lymph: No lymphadenopathy of posterior or anterior cervical chain or axillae bilaterally.  Gait antalgic gait walks with the aid of a cane MSK:  Non tender with full range of motion and good stability and symmetric strength and tone of  elbows, wrist, hip, knee and ankles bilaterally. Arthritic changes of multiple joints.   Shoulder: Bilateral Severe atrophy noted. Diffuse tenderness bilaterally left couldn't and right Unable to do full testing  secondary to pain and significant crepitus Rotator cuff strength to out of 5 on the left side and 3 out of 5 on the right side Speeds and Yergason's tests normal. Positive painful arc and drop arm No change   Neck: Inspection unremarkable. No palpable stepoffs. Positive Spurling's maneuver. Still present.  Limited range of motion lacking the last 10 of extension as well as 10 of side bending bilaterally no improvement.  Grip strength and sensation normal in bilateral hands Strength good C4 to T1 distribution No sensory change to C4 to T1 Negative Hoffman sign bilaterally Reflexes normal      Impression and Recommendations:     This case required medical decision making of moderate complexity.      Note: This dictation was prepared with Dragon dictation along with smaller phrase technology. Any transcriptional errors that result from this process are unintentional.

## 2016-03-04 ENCOUNTER — Ambulatory Visit: Payer: Medicare Other | Admitting: Family Medicine

## 2016-03-05 ENCOUNTER — Ambulatory Visit: Payer: Medicare Other | Attending: Internal Medicine | Admitting: Physical Therapy

## 2016-03-05 ENCOUNTER — Ambulatory Visit: Payer: Medicare Other | Admitting: Physical Therapy

## 2016-03-05 ENCOUNTER — Encounter: Payer: Self-pay | Admitting: Physical Therapy

## 2016-03-05 DIAGNOSIS — M6249 Contracture of muscle, multiple sites: Secondary | ICD-10-CM

## 2016-03-05 DIAGNOSIS — M6281 Muscle weakness (generalized): Secondary | ICD-10-CM

## 2016-03-05 DIAGNOSIS — R2689 Other abnormalities of gait and mobility: Secondary | ICD-10-CM | POA: Diagnosis not present

## 2016-03-05 NOTE — Therapy (Signed)
[facility] [address] [place], [place], [address] Phone: [phone]   Fax:  [phone]  Physical Therapy Evaluation  Patient Details  Name: NEIRA BENTSEN MRN: [id number] Date of Birth: 1931-12-10 No Data Recorded  Encounter Date: 03/05/2016    Past Medical History:  Diagnosis Date  . Acute blood loss anemia 04/20/2012  . AF (atrial fibrillation) ([facility] [place])     AV ablation 9/09 [place] per Dr Ola Spurr - AV node ablation 9/11 Dr Caryl Comes  . Amiodarone pulmonary toxicity   . Asthma   . Bronchitis    hx of   . [name] (coronary artery disease)    (not sure of this 11/10  . CHF (congestive heart failure) ([place])   . Chronic renal insufficiency, stage III (moderate)    CrCl about 60 ml/min  . Complication of anesthesia    "psycotic episode" after hip surg - resolved  . COPD (chronic obstructive pulmonary disease) (H[facility])    emphysema -FeV1 73% DLCO 53% 5/09  . CVA (cerebral vascular accident) ([facility])    no residual effects evident to pt   . Depression 06/06/2012  . Diabetes ([facility])   . Diastolic heart failure    Acute on Chronic  . Eczema   . GERD (gastroesophageal reflux disease)   . History of skin cancer   . Hx of cardiovascular stress test    Lexiscan Myoview (09/2013):  No definite ischemia, EF 67%; low risk  . Hyperlipidemia   . Invasive ductal carcinoma of breast ([facility]) 2011   LEFT   . Mental disorder   . OA (osteoarthritis) of knee    RIGHT  . Pacemaker    Permanent  . PAD (peripheral artery disease) (H[facility])    w/hx right iliac/SFA stenting and left and rt leg PTA  . Pain    pt states has pain in fingertips per right hand pt states has been told may be carpal tunnel  . Pneumonia    hx of   . Right sided sciatica   . Shortness of breath dyspnea    walking distances   . Sinoatrial node dysfunction ([facility])   . Sleep apnea    associated with hypersomnia uses O2 2L/M at night and during naps also uses CPAP  .  Tobacco abuse   . Tremors of nervous system    hands bilat     Past Surgical History:  Procedure Laterality Date  . ATRIAL ABLATION SURGERY     x 2 - "did not help - had to have pacemaker"  . BREAST LUMPECTOMY     left breast  . CARDIAC CATHETERIZATION    . CATARACT EXTRACTION, BILATERAL     with IOL/Dr Katy Fitch  . EP IMPLANTABLE DEVICE N/A 02/13/2015   Procedure: PPM Generator Changeout-St. Jude device;  Surgeon: Deboraha Sprang, MD;  Location: [facility];  Service: Cardiovascular;  Laterality: N/A;  . MASTECTOMY, PARTIAL  02/03/2010   Left/Dr Rosenbower  . ORIF ACETABULAR FRACTURE Left 04/19/2012   Procedure: OPEN REDUCTION INTERNAL FIXATION (ORIF) ACETABULAR FRACTURE;  Surgeon: Rozanna Box, MD;  Location: [facility];  Service: Orthopedics;  Laterality: Left;  . sun spot removed      forhead  . TOTAL HIP ARTHROPLASTY Left 10/31/2014   Procedure: LEFT TOTAL HIP ARTHROPLASTY POSTERIOR  APPROACH;  Surgeon: Gaynelle Arabian, MD;  Location: [facility];  Service: Orthopedics;  Laterality: Left;  . TOTAL KNEE ARTHROPLASTY Right 10/16/2013   Procedure: RIGHT TOTAL KNEE ARTHROPLASTY;  Surgeon: Pilar Plate  Zella Ball, MD;  Location: [facility];  Service: Orthopedics;  Laterality: Right;  Marland Kitchen VASCULAR SURGERY     both legs     There were no vitals filed for this visit.     Mobility/Seating Evaluation    PATIENT INFORMATION: Name: DANINE HOR DOB: 12-Oct-1941  Sex: Female Date seen: 03/05/2016 Time: 13:15  Address:  [address] [place] [address] Physician: Pricilla Holm, MD This evaluation/justification form will serve as the LMN for the following suppliers: __________________________ Supplier: [facility] Contact Person: Yvone Neu Phone:  [phone]   Seating Therapist: Jamey Reas, PT Phone:   [phone]   Phone: [phone] home    Spouse/Parent/Caregiver name: Jayleena Stille, husband  Phone number: [phone] Insurance/Payer: [place]     Reason for Referral: to get power wheelchair  Patient/Caregiver Goals: She wants to be able to get around her home.   Patient was seen for face-to-face evaluation for new power wheelchair.  Also present was [employer] to discuss recommendations and wheelchair options.  Further paperwork was completed and sent to vendor.  Patient appears to qualify for power mobility device at this time per objective findings.   MEDICAL HISTORY: Diagnosis: Primary Diagnosis: Severe OsteoArthritis Onset: 2014 degenerative changes Diagnosis: [name], A-Fib, COPD, Bronchitis, Asthma, CHF, Chronic renal insufficiency stage III, DM2, Invasive ductual carcinoma of breast, CVA, PAD, sciatica, Chronic left humerus fracture, bil. rotator cuff arthropathy, osteoporosis    _0 Progressive Disease Relevant past and future surgeries: Left Total Hip Arthroplasty 10/31/2014, Right Total knee arthroplasty 10/16/2013, vascular surgery both LEs, Mastectomy, ORIF acetabular fracture 04/19/2012,    Height: 5'5.5" Weight: 174# Explain recent changes or trends in weight: Gained 10#   History including Falls: She has fallen 4+ times in last 6 months. No injuries.     HOME ENVIRONMENT: _1 House  _2 Condo/town home  _3 Apartment  _4 Retirement Community    _5 Lives Alone _6  Lives with Others                                                                                          Hours with caregiver: husband is 53yo & around but no caregivers  _7 Home is accessible to patient           Stairs      _8 Yes _9  No     Ramp _10 Yes _11 No Comments:  she lives [employer] lives on 3rd floor with elevators. The dining room is first floor with ~100' to elevator and then 100' to dining area. Second floor has recreation activity area.    COMMUNITY ADL: TRANSPORTATION: _12 Car    _13 Van    <GHWEXHBZJIRCVELF>_8<\/BOFBPZWCHENIDPOE>_42 Public Transportation    _15 Adapted w/c Lift    _16 Ambulance    _17 Other:       _18 Sits in wheelchair during transport  Employment/School:  ????? Specific requirements pertaining to mobility ?????  Other: [facility] has Lucianne Lei with wheelchair lift.     FUNCTIONAL/SENSORY PROCESSING SKILLS:  Handedness:   _19 Right     _20 Left    _21 NA  Comments:  ?????  Functional Processing Skills for [employer] Skills are adequate for safe wheelchair  operation  Areas of concern than may interfere with safe operation of wheelchair Description of problem   _0  Attention to environment      _1 Judgment      _2  Hearing  _3  Vision or visual processing      _4 Motor Planning  _5  Fluctuations in Behavior  ?????    VERBAL COMMUNICATION: _6 WFL receptive _7  WFL expressive _8 Understandable  _9 Difficult to understand  _10 non-communicative _11  Uses an augmented communication device  CURRENT SEATING / MOBILITY: Current Mobility Base:  _12 None _13 Dependent _14 Manual _15 Scooter _16 Power  Type of Control: ?????  Manufacturer:  ?????Size:  ?????Age: ?????  Current Condition of Mobility Base:  ?????   Current Wheelchair components:  ?????  Describe posture in present seating system:  ?????      SENSATION and SKIN ISSUES: Sensation _17 Intact  _18 Impaired _19 Absent  Level of sensation: ????? Pressure Relief: Able to perform effective pressure relief :    _20 Yes  _21  No Method: stand with support If not, Why?: ?????  Skin Issues/Skin Integrity Current Skin Issues  _22 Yes _23 No _24 Intact _25  Red area_26  Open Area  _27 Scar Tissue _28 At risk from prolonged sitting Where  wound on left knee from recent fall  History of Skin Issues  _29 Yes _30 No Where  ????? When  ?????  Hx of skin flap surgeries  _31 Yes _32 No Where  ????? When  ?????  Limited sitting tolerance _33 Yes _34 No Hours spent sitting in wheelchair daily: limited to 3-4 hrs due to sleep apnea causing her to doze off   Complaint of Pain:  Please describe: Pain in upper back to elbows and hips/knee up to 7/10 limiting standing & walking.    Swelling/Edema: none   ADL STATUS (in reference to  wheelchair use):  Indep Assist Unable Indep with Equip Not assessed Comments  Dressing ????? X ????? ????? ????? She sits to dress. Her husband assist with shirts, shoes/socks.   Eating X ????? ????? ????? ????? Sits with back support  Toileting ????? ????? ????? X ????? needs raised seat  Bathing ????? X ????? X ????? sits on raised seat, needs assist with hair & feet  Grooming/Hygiene ????? X ????? ????? ????? Needs assist to reach head for combing hair  Meal Prep ????? ????? x ????? ????? [facility] provides 2 meals day and she can do simple in room  IADLS X ????? ????? ????? ????? ?????  Bowel Management: _35 Continent  _36 Incontinent  _37 Accidents Comments:  ?????  Bladder Management: _38 Continent  _39 Incontinent  _40 Accidents Comments:  ?????     WHEELCHAIR SKILLS: Manual w/c Propulsion: _41 UE or LE strength and endurance sufficient to participate in ADLs using manual wheelchair Arm : _42 left _43 right   _44 Both      Distance: ????? Foot:  _45 left _46 right   _47 Both  Operate Scooter: _48  Strength, hand grip, balance and transfer appropriate for use _49 Living environment is accessible for use of scooter  Operate Power w/c:  _50  Std. Joystick   _51  Alternative Controls Indep _52  Assist _53  Dependent/unable _54  N/A _55   _56 Safe          _57  Functional      Distance: ?????  Bed confined without wheelchair _58  Yes _59  No   STRENGTH/RANGE OF MOTION:  passive Range of Motion Strength  Shoulder flexion right 54*, left 41*; abduction right 22* left 36* bil. shoulders 2-/5  Elbow WFL bilaterally flexion 4/5 bil. & extension 3/5 bil.  Wrist/Hand WFL bilaterally grip right dominant 9#, left UE 10#  Hip WFL 1 rep grossly flexion 4/5, abduction &  extension 3/5 but fatigues  Knee WFL extension 5/5 but fatigues quickly & flexion 4/5 fatigues   Ankle WFL dorsiflexion grossly 5/5     MOBILITY/BALANCE:  _0  Patient is totally dependent for mobility  ?????    Balance Transfers Ambulation  Sitting Balance:  Standing Balance: _1  Independent _2  Independent/Modified Independent  _3  WFL     _4  WFL _5  Supervision _6  Supervision  _7  Uses UE for balance  _8  Supervision _9  Min Assist _10  Ambulates with Assist  ?????    _11  Min Assist _12  Min assist _13  Mod Assist _14  Ambulates with Device:      _15  RW  _16  StW  _17  Cane  _18  ?????  _19  Mod Assist _20  Mod assist _21  Max assist   _22  Max Assist _23  Max assist _24  Dependent _25  Indep. Short Distance Only  _26  Unable _27  Unable _28  Lift / Sling Required Distance (in feet)  20 then fatigues able to amb 100'   _29  Sliding board _30  Unable to Ambulate (see explanation below)  Cardio Status:  _31 Intact  _32  Impaired   _33  NA     Heart rate increases from 72bpm at rest to 97bpm with gait 100'   Respiratory Status:  _34 Intact   _35 Impaired   _36 NA     Oxygen saturation dropped from 96% at rest to 87% with gait 100' with dyspnea 4/4.   Orthotics/Prosthetics: none  Comments (Address manual vs power w/c vs scooter): Sit to stand requires elevated seat with armrests with multple attempts. Berg Balance Test 8/56 indicating high fall risk & dependence in ADLs. She ambulates with cane with gait deviations indicating high fall risk which increase with fatigue. Gait velocity of 1.61 ft/sec indicates fall risk. Scanning environment and negotiating around furniture causes balance losses with high fall risk. Timed Up & Go with cane 29.88sec indicates fall risk.           Anterior / Posterior Obliquity Rotation-Pelvis decreased lordosis  PELVIS    _37  _38  _39   Neutral Posterior Anterior  _40  _41  _42   WFL Rt elev Lt elev  _43  _44  _45   WFL Right Left                      Anterior    Anterior     _46  Fixed _47  Other _48  Partly Flexible _49  Flexible   _50  Fixed _51  Other _52  Partly Flexible  _53  Flexible  _54  Fixed _55  Other _56  Partly Flexible  _57  Flexible   TRUNK  _58  _59  _60   WFL ? Thoracic ? Lumbar  Kyphosis Lordosis  _61  _62  _63   [facility] Convex Convex  Right Left _64 c-curve _65 s-curve _66 multiple  _67   Neutral _68  Left-anterior _69  Right-anterior     _70  Fixed _71  Flexible _72  Partly Flexible _73  Other  _74  Fixed _75  Flexible _76  Partly Flexible _77  Other  _78  Fixed             _79  Flexible _80  Partly Flexible _81  Other    Position Windswept  ?????  HIPS          _82            _83               _84    Neutral       Abduct        ADduct         _85           _86            _87   Neutral Right           Left      _0  Fixed _1  Subluxed _2  Partly Flexible _3  Dislocated _4  Flexible  _5  Fixed _6  Other _7  Partly Flexible  _8  Flexible                 Foot Positioning Knee Positioning  ?????    _9  WFL  _10 Lt _11 Rt _12  WFL  _13 Lt _14 Rt    KNEES ROM concerns: ROM concerns:    & Dorsi-Flexed _15 Lt _16 Rt ?????    FEET Plantar Flexed _17 Lt _18 Rt      Inversion                 _19 Lt _20 Rt      Eversion                 _21 Lt _22 Rt     HEAD _23  Functional _24  Good Head Control  ?????  & _25  Flexed         _26  Extended _27  Adequate Head Control    NECK _28  Rotated  Lt  _29  Lat Flexed Lt _30  Rotated  Rt _31  Lat Flexed Rt _32  Limited Head Control     _33  Cervical Hyperextension _34  Absent  Head Control     SHOULDERS ELBOWS WRIST& HAND ?????      Left     Right    Left     Right    Left     Right   U/E _35 Functional           _36 Functional WFL WFL _37 Fisting             _38 Fisting      _39 elev   _40 dep      _41 elev   _42 dep       _43 pro -_44 retract     _45 pro  _46 retract _47 subluxed             _48 subluxed           Goals for Wheelchair Mobility  _49  Independence with mobility in the home with motor related ADLs (MRADLs)  _50  Independence with MRADLs in the community _51  Provide dependent mobility  _52  Provide recline     _53 Provide tilt   Goals for Seating system _54  Optimize pressure distribution _55  Provide support needed to facilitate function or safety _56  Provide corrective forces to assist with maintaining or improving posture _57  Accommodate client's posture:   current seated postures and positions are not flexible or will  not tolerate corrective forces _58  Client to be independent with relieving pressure in the wheelchair _59 Enhance physiological function such as breathing, swallowing, digestion  Simulation ideas/Equipment trials:????? State why other equipment was unsuccessful:?????   MOBILITY BASE RECOMMENDATIONS and JUSTIFICATION: MOBILITY COMPONENT JUSTIFICATION  Manufacturer: JazzyModel: Select 6   Size: Width 18"Seat Depth 18" _60 provide transport from point A to B      _61 promote Indep mobility  _62 is not a safe, functional ambulator _63 walker or cane inadequate _64 non-standard width/depth necessary to accommodate anatomical measurement _65  ?????  _66 Manual Mobility Base _67 non-functional ambulator    _68 Scooter/POV  _69 can safely operate  _70 can safely transfer   _71 has adequate trunk stability  _72 cannot functionally propel manual w/c  _73 Power Mobility Base  _74 non-ambulatory  _75 cannot functionally propel manual wheelchair  _76  cannot functionally and safely operate scooter/POV _77 can safely operate and willing to  _78 Stroller Base _79 infant/child  _80 unable to propel manual wheelchair _81 allows for growth _82 non-functional ambulator _83 non-functional UE _84 Indep mobility is not a goal at this time  _85   Tilt  _0 Forward _1 Backward _2 Powered tilt  _3 Manual tilt  _4 change position against gravitational force on head and shoulders  _5 change position for pressure relief/cannot weight shift _6 transfers  _7 management of tone _8 rest periods _9 control edema _10 facilitate postural control  _11  ?????  _12 Recline  _13 Power recline on power base _14 Manual recline on manual base  _15 accommodate femur to back angle  _16 bring to full recline for ADL care  _17 change position for pressure relief/cannot weight shift _18 rest periods _19 repositioning for transfers or clothing/diaper /catheter changes _20 head positioning  _21 Lighter weight required _22 self- propulsion  _23 lifting _24  ?????  _25 Heavy Duty required _26 user weight  greater than 250# _27 extreme tone/ over active movement _28 broken frame on previous chair _29  ?????  _30  Back  _31  Angle Adjustable _32  Custom molded Captain's Seat _33 postural control _34 control of tone/spasticity _35 accommodation of range of motion _36 UE functional control _37 accommodation for seating system _38  ????? _39 provide lateral trunk support _40 accommodate deformity _41 provide posterior trunk support _42 provide lumbar/sacral support _43 support trunk in midline _44 Pressure relief over spinal processes  _45  Seat Cushion Captain's Seat _46 impaired sensation  _47 decubitus ulcers present _48 history of pressure ulceration _49 prevent pelvic extension _50 low maintenance  _51 stabilize pelvis  _52 accommodate obliquity _53 accommodate multiple deformity _54 neutralize lower extremity position _55 increase pressure distribution _56  ?????  _57  Pelvic/thigh support  _58  Lateral thigh guide _59  Distal medial pad  _60  Distal lateral pad _61  pelvis in neutral _62 accommodate pelvis _63  position upper legs _64  alignment _65  accommodate ROM _66  decr adduction _67 accommodate tone _68 removable for transfers _69 decr abduction  _70  Lateral trunk Supports _71  Lt     _72  Rt _73 decrease lateral trunk leaning _74 control tone _75 contour for increased contact _76 safety  _77 accommodate asymmetry _78  ?????  _79  Mounting hardware  _80 lateral trunk supports  _81 back   _82 seat _83 headrest      _84  thigh support _85 fixed   _86 swing away _87 attach seat platform/cushion to w/c frame _88 attach back cushion to w/c frame _89 mount postural supports _90 mount headrest  _91 swing medial thigh support away _92 swing lateral supports away for transfers  _93  ?????    Armrests  _94 fixed _95 adjustable height _96 removable   _97 swing away  _98 flip back   _99 reclining _100 full length pads _101 desk    _102 pads tubular  _103 provide support with elbow at 90   _104 provide support for w/c tray _105 change of height/angles for variable activities _106 remove for transfers _107 allow to  come closer to table top _108 remove for access to tables _109  ?????  Hangers/ Leg rests  _110 60 _111 70 _112 90 _113 elevating _114 heavy duty  _115 articulating _116 fixed _117 lift off _118 swing away     _119 power _120 provide LE support  _121 accommodate to hamstring tightness _122 elevate legs during recline   _123 provide change in position for Legs _124 Maintain placement of feet on footplate _125 durability _126 enable transfers _127 decrease edema _128 Accommodate lower leg length _129  ?????  Foot support Footplate    <VOHYWVPXTGGYIRSW>_5<\/IOEVOJJKKXFGHWEX>_937 Lt  _131  Rt  _132  Center mount _133 flip up     _134 depth/angle adjustable _135 Amputee adapter    _136  Lt     _137  Rt _138 provide foot support _139 accommodate to ankle ROM _140 transfers _141 Provide support for residual extremity _142  allow foot to go under wheelchair base _143  decrease tone  _144  ?????  _145  Ankle strap/heel loops _146 support foot on foot support _147 decrease extraneous movement _148 provide input to heel  _149 protect foot  Tires: _150 pneumatic  _151 flat free inserts  _152 solid  _153 decrease maintenance  _154 prevent frequent flats _155 increase shock absorbency _156 decrease pain from road shock _157 decrease spasms from road shock _158  ?????  _159  Headrest  _160 provide posterior head support _161 provide posterior neck support _162 provide lateral head support _163 provide  anterior head support _0 support during tilt and recline _1 improve feeding   _2 improve respiration _3 placement of switches _4 safety  _5 accommodate ROM  _6 accommodate tone _7 improve visual orientation  _8  Anterior chest strap _9  Vest _10  Shoulder retractors  _11 decrease forward movement of shoulder _12 accommodation of TLSO _13 decrease forward movement of trunk _14 decrease shoulder elevation _15 added abdominal support _16 alignment _17 assistance with shoulder control  _18  ?????  Pelvic Positioner _19 Belt _20 SubASIS bar _21 Dual Pull _22 stabilize tone _23 decrease falling out of chair/ **will not Decr potential for sliding due to pelvic tilting _24 prevent excessive rotation _25 pad for  protection over boney prominence _26 prominence comfort _27 special pull angle to control rotation _28  ?????  Upper Extremity Support _29 L   _30  R _31 Arm trough    _32 hand support _33  tray       _34 full tray _35 swivel mount _36 decrease edema      _37 decrease subluxation   _38 control tone   _39 placement for AAC/Computer/EADL _40 decrease gravitational pull on shoulders _41 provide midline positioning _42 provide support to increase UE function _43 provide hand support in natural position _44 provide work surface   POWER WHEELCHAIR CONTROLS  _45 Proportional  _46 Non-Proportional Type Joystick _47 Left  _48 Right _49 provides access for controlling wheelchair   _50 lacks motor control to operate proportional drive control <EXHBZJIRCVELFYBO>_1<\/BPZWCHENIDPOEUMP>_53 unable to understand proportional controls  Actuator Control Module  _52 Single  _53 Multiple   _54 Allow the client to operate the power seat function(s) through the joystick control   _55 Safety Reset Switches _56 Used to change modes and stop the wheelchair when driving in latch mode    _57 [employer]   _58 programming for accurate control _59 progressive Disease/changing condition _60 non-proportional drive control needed _61 Needed in order to operate power seat functions through joystick control   _62 Display box _63 Allows user to see in which mode and drive the wheelchair is set  _64 necessary for alternate controls    _65 Digital interface electronics _66 Allows w/c to operate when using alternative drive controls  <IRWERXVQMGQQPYPP>_5<\/KDTOIZTIWPYKDXIP>_38 ASL Head Array _68 Allows client to operate wheelchair  through switches placed in tri-panel headrest  _69 Sip and puff with tubing kit _70 needed to operate sip and puff drive controls  <SNKNLZJQBHALPFXT>_0<\/WIOXBDZHGDJMEQAS>_34 Upgraded tracking electronics _72 increase safety when driving <HDQQIWLNLGXQJJHE>_1<\/DEYCXKGYJEHUDJSH>_70 correct tracking when on uneven surfaces  _74 [facility] for switches or joystick _75 Attaches switches to w/c  _76 Swing away for access or transfers _77 midline for optimal placement _78 provides for consistent access  _79 Attendant controlled joystick plus mount  _80 safety _81 long distance driving <YOVZCHYIFOYDXAJO>_8<\/NOMVEHMCNOBSJGGE>_36 operation of seat functions _83 compliance with transportation regulations _84  ?????    Rear wheel placement/Axle adjustability _85 None _86 semi adjustable _87 fully adjustable  _88 improved UE access to wheels _89 improved stability _90 changing angle in space for improvement of postural stability _91 1-arm drive access <OQHUTMLYYTKPTWSF>_6<\/CLEXNTZGYFVCBSWH>_67 amputee pad placement _93  ?????  Wheel rims/ hand rims  _94 metal  _95 plastic coated _96 oblique projections _97 vertical projections _98 Provide ability to propel manual wheelchair  _99  Increase self-propulsion with hand weakness/decreased grasp  Push handles _100 extended  _101 angle adjustable  _102 standard _103 caregiver access _104 caregiver assist _105 allows "hooking" to enable increased ability to perform ADLs or maintain balance  One armed device  _106 Lt   _107 Rt _108 enable propulsion of manual wheelchair with one arm   _109  ?????   Brake/wheel lock extension _110  Lt   _111  Rt _112 increase indep in applying wheel locks   _113 Side guards _114 prevent clothing getting caught in wheel or becoming soiled _115  prevent skin tears/abrasions  Battery: U1 X 2 _116 to power wheelchair ?????  Other: ????? ????? ?????  The above equipment has a life- long use expectancy. Growth and changes in medical and/or functional conditions would be the exceptions. This is to certify that the therapist has  no financial relationship with durable medical provider or manufacturer. The therapist will not receive remuneration of any kind for the equipment recommended in this evaluation.   Patient has mobility limitation that significantly impairs safe, timely participation in one or more mobility related ADL's.  (bathing, toileting, feeding, dressing, grooming, moving from room to room)                                                             _0  Yes _1  No Will mobility device sufficiently improve ability to participate and/or be aided in participation of MRADL's?         _2  Yes _3  No Can limitation be compensated  for with use of a cane or walker?                                                                                _4  Yes _5  No Does patient or caregiver demonstrate ability/potential ability & willingness to safely use the mobility device?   _6  Yes _7  No Does patient's home environment support use of recommended mobility device?                                                    _8  Yes _9  No Does patient have sufficient upper extremity function necessary to functionally propel a manual wheelchair?    _10  Yes _11  No Does patient have sufficient strength and trunk stability to safely operate a POV (scooter)?                                  _12  Yes _13  No Does patient need additional features/benefits provided by a power wheelchair for MRADL's in the home?       _14  Yes _15  No Does the patient demonstrate the ability to safely use a power wheelchair?                                                              _16  Yes _17  No  Therapist Name Printed: Jamey Reas, PT, DPT Date: 03/05/2016  Therapist's Signature:   Date:   Supplier's Name Printed: [employer], [employer] Date: 03/05/2016  Supplier's Signature:   Date:  Patient/Caregiver Signature:   Date:     This is to certify that I have read this evaluation and do agree with the content within:      Physician's Name Printed: Pricilla Holm, MD  Physician's Signature:  Date:     This is to certify that I, the above signed therapist have the following affiliations: _18  This DME provider _19   Manufacturer of recommended equipment _0  Patient's long term care facility _1  None of the above                                      Patient will benefit from skilled therapeutic intervention in order to improve the following deficits and impairments:     Visit Diagnosis: No diagnosis found.     Problem List Patient Active Problem List   Diagnosis Date Noted  . Rotator cuff tear arthropathy of both shoulders  12/09/2015  . Pain in joint, upper arm 11/22/2015  . Tremors of nervous system 05/21/2015  . Complete heart block ([place]) 02/13/2015  . Acute blood loss anemia 01/28/2015  . Constipation 11/07/2014  . OA (osteoarthritis) of hip 10/31/2014  . Chest pain, atypical 06/07/2014  . Osteoporosis 01/10/2014  . Nicotine dependence in remission 06/28/2013  . GERD (gastroesophageal reflux disease) 07/19/2012  . Depression 06/06/2012  . Secondary hyperparathyroidism ([place]) 04/25/2012  . Decreased bone density 04/20/2012  . Gold stage C. COPD with frequent exacerbations 12/15/2011  . Diastolic CHF, chronic ([place]) 12/15/2011  . Routine health maintenance 08/27/2011  . History of stroke 02/21/2010  . PACEMAKER, PERMANENT 02/21/2010  . Breast cancer of upper-inner quadrant of left female breast ([place]) 02/03/2010  . Allergic rhinitis 12/31/2009  . Carotid stenosis 08/17/2008  . Controlled diabetes mellitus type 2 with complications ([place]) 49/17/9150  . Obstructive sleep apnea 06/01/2008  . Atrial fibrillation ([place]) 06/01/2008  . [name] 06/01/2008  . Hyperlipidemia 12/30/2006  . Coronary atherosclerosis 12/30/2006    Kiaja Shorty PT, DPT 03/05/2016, 2:24 PM  [facility] [address] [place], [place], [address] Phone: [phone]   Fax:  [phone]  Name: MIEKO KNEEBONE MRN: [id number] Date of Birth: 03-02-1932

## 2016-03-06 DIAGNOSIS — M6281 Muscle weakness (generalized): Secondary | ICD-10-CM | POA: Diagnosis not present

## 2016-03-06 DIAGNOSIS — R278 Other lack of coordination: Secondary | ICD-10-CM | POA: Diagnosis not present

## 2016-03-07 ENCOUNTER — Other Ambulatory Visit: Payer: Self-pay | Admitting: Internal Medicine

## 2016-03-10 DIAGNOSIS — M6281 Muscle weakness (generalized): Secondary | ICD-10-CM | POA: Diagnosis not present

## 2016-03-10 DIAGNOSIS — R2681 Unsteadiness on feet: Secondary | ICD-10-CM | POA: Diagnosis not present

## 2016-03-10 DIAGNOSIS — R2689 Other abnormalities of gait and mobility: Secondary | ICD-10-CM | POA: Diagnosis not present

## 2016-03-10 DIAGNOSIS — R278 Other lack of coordination: Secondary | ICD-10-CM | POA: Diagnosis not present

## 2016-03-12 ENCOUNTER — Ambulatory Visit (INDEPENDENT_AMBULATORY_CARE_PROVIDER_SITE_OTHER): Payer: Medicare Other | Admitting: Internal Medicine

## 2016-03-12 ENCOUNTER — Encounter: Payer: Self-pay | Admitting: Internal Medicine

## 2016-03-12 DIAGNOSIS — J449 Chronic obstructive pulmonary disease, unspecified: Secondary | ICD-10-CM

## 2016-03-12 MED ORDER — DOXYCYCLINE HYCLATE 100 MG PO TABS: 100.0000 mg | ORAL_TABLET | Freq: Two times a day (BID) | ORAL | 0 refills | Status: DC

## 2016-03-12 MED ORDER — PREDNISONE 20 MG PO TABS: 40.0000 mg | ORAL_TABLET | Freq: Every day | ORAL | 0 refills | Status: DC

## 2016-03-12 MED ORDER — HYDROCODONE-HOMATROPINE 5-1.5 MG/5ML PO SYRP: 5.0000 mL | ORAL_SOLUTION | Freq: Three times a day (TID) | ORAL | 0 refills | Status: DC | PRN

## 2016-03-12 NOTE — Progress Notes (Signed)
   Subjective:    Patient ID: [name], female    DOB: 29-Aug-1931, 83 y.o.   MRN: [id number]  HPI The patient is an 83 YO female coming in for SOB and cough. Started after christmas and has worsened since that time. Several sick contacts over christmas. She is having cough which is productive and green, no fevers or chills. Significant SOB which has limited activity severely. She is still taking her inhalers as usual but has not increased her rescue inhaler or nebulizer due to not being able to get around well. Overall worsening.   Review of Systems  Constitutional: Positive for activity change, appetite change and fatigue. Negative for chills, fever and unexpected weight change.  HENT: Positive for congestion, postnasal drip and rhinorrhea. Negative for ear discharge, ear pain, sinus pain, sinus pressure, sore throat and trouble swallowing.   Eyes: Negative.   Respiratory: Positive for cough and shortness of breath. Negative for chest tightness and wheezing.   Cardiovascular: Negative.   Gastrointestinal: Negative.   Musculoskeletal: Negative.   Neurological: Negative.       Objective:   Physical Exam  Constitutional: She appears well-developed and well-nourished.  HENT:  Head: Normocephalic and atraumatic.  Oropharynx with redness and clear drainage, no nasal crusting.   Eyes: EOM are normal.  Neck: Normal range of motion.  Cardiovascular: Normal rate and regular rhythm.   Pulmonary/Chest: Effort normal. No respiratory distress. She has no wheezes. She has no rales.  Diffuse rhonchi which do not clear with cough  Abdominal: Soft. She exhibits no distension. There is no tenderness.  Skin: Skin is warm and dry.   Vitals:   03/12/16 0933  BP: 130/80  Pulse: 78  Resp: 20  Temp: 98.3 F (36.8 C)  TempSrc: Oral  SpO2: 91%  Weight: 174 lb (78.9 kg)  Height: '5\' 5"'$  (1.651 m)      Assessment & Plan:

## 2016-03-12 NOTE — Assessment & Plan Note (Signed)
With exacerbation today and will treat with doxycycline and prednisone as well as hycodan cough syrup. Oxygen levels are still normal today although lower than her baseline (around 95-96% usually). Given her severe COPD needs early treatment. We talked about scheduling some nebulizer treatments at home to help with her SOB and activity. She will continue spiriva and her advair as well.

## 2016-03-12 NOTE — Progress Notes (Signed)
Pre visit review using our clinic review tool, if applicable. No additional management support is needed unless otherwise documented below in the visit note. 

## 2016-03-12 NOTE — Patient Instructions (Signed)
We have sent in prednisone to start taking. Take 2 pills daily for 1 week.   We have also sent in an antibiotic called doxycycline. Take 1 pill twice a day for 10 days to clear the lungs and the sinuses.   We have given you the cough syrup to use as well for cough as needed.   Call us if you are not getting better or if you get worse.

## 2016-03-13 DIAGNOSIS — M6281 Muscle weakness (generalized): Secondary | ICD-10-CM | POA: Diagnosis not present

## 2016-03-13 DIAGNOSIS — R2681 Unsteadiness on feet: Secondary | ICD-10-CM | POA: Diagnosis not present

## 2016-03-13 DIAGNOSIS — R2689 Other abnormalities of gait and mobility: Secondary | ICD-10-CM | POA: Diagnosis not present

## 2016-03-13 DIAGNOSIS — R278 Other lack of coordination: Secondary | ICD-10-CM | POA: Diagnosis not present

## 2016-03-16 ENCOUNTER — Ambulatory Visit (INDEPENDENT_AMBULATORY_CARE_PROVIDER_SITE_OTHER): Payer: Medicare Other | Admitting: Pulmonary Disease

## 2016-03-16 ENCOUNTER — Encounter: Payer: Self-pay | Admitting: Pulmonary Disease

## 2016-03-16 VITALS — BP 110/68 | HR 71 | Ht 65.0 in | Wt 174.4 lb

## 2016-03-16 DIAGNOSIS — R2681 Unsteadiness on feet: Secondary | ICD-10-CM | POA: Diagnosis not present

## 2016-03-16 DIAGNOSIS — J449 Chronic obstructive pulmonary disease, unspecified: Secondary | ICD-10-CM

## 2016-03-16 DIAGNOSIS — R278 Other lack of coordination: Secondary | ICD-10-CM | POA: Diagnosis not present

## 2016-03-16 DIAGNOSIS — M6281 Muscle weakness (generalized): Secondary | ICD-10-CM | POA: Diagnosis not present

## 2016-03-16 DIAGNOSIS — R2689 Other abnormalities of gait and mobility: Secondary | ICD-10-CM | POA: Diagnosis not present

## 2016-03-16 NOTE — Patient Instructions (Addendum)
Continue using your inhalers as prescribed Finish the course of doxycycline and prednisone  We will check an x-ray. Return to clinic in

## 2016-03-16 NOTE — Progress Notes (Addendum)
[name]    [id number]    12-10-1931  Primary Care Physician:Elizabeth Becky Augusta, MD  Referring Physician: Hoyt Koch, MD [address] [place], [place] [address]  Chief complaint:   Follow up for COPD GOLD C Heavy ex smoker OSA on CPAP with 2Lt O2  HPI: [name] has history of COPD with frequent exacerbations in the past. She is a former patient of [name] and a heavy former smoker. She smoked one and half to 3 packs per day for nearly 60 years. She quit 2013. She is actually improved after quitting in terms of her respiratory status. At last visit she was given doxy and steroids for an exacerbation. She feels well now and back to baseline.  She underwent right hip and total knee replacement in the past. She has apparently tolerated these procedures well. She has history of atrial fibrillation and failed several ablations. She has a permanent pacemaker. 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 She is not on amiodarone at present.  Interim History: She was evaluated at the primary care office on 03/12/16 for increasing dyspnea, cough with sputum production and higher oxygen requirements. She was noted to have diffuse rhonchi and was started on doxycycline and prednisone. She reports improvement in symptoms since then. She was walked in the office today with no desaturations. Her oxygen levels remained at 93%. She moved to a new nursing home [facility] and is participating in physical therapy there  Outpatient Encounter Prescriptions as of 03/16/2016  Medication Sig  . acetaminophen (TYLENOL) 650 MG CR tablet Take 1,300 mg by mouth every 8 (eight) hours as needed for pain.  Marland Kitchen AMITIZA 24 MCG capsule TAKE 1 CAPSULE TWICE DAILY WITH A MEAL  . Biotin (BIOTIN 5000) 5 MG CAPS Take 5 mg by mouth at bedtime.   Marland Kitchen doxycycline (VIBRA-TABS) 100 MG tablet Take  1 tablet (100 mg total) by mouth 2 (two) times daily.  Marland Kitchen ELIQUIS 5 MG TABS tablet TAKE 1 TABLET TWICE DAILY  (MUST  ESTABLISH  NEW  PRIMARY CARE PROVIDER  FOR FUTURE REFILLS)  . Fluticasone-Salmeterol (ADVAIR) 250-50 MCG/DOSE AEPB Inhale 1 puff into the lungs 2 (two) times daily.  Marland Kitchen gabapentin (NEURONTIN) 100 MG capsule Take 1 capsule (100 mg total) by mouth at bedtime.  . metFORMIN (GLUCOPHAGE) 500 MG tablet TAKE 1 TABLET TWICE DAILY WITH A MEAL  . Multiple Vitamins-Minerals (MULTIVITAMIN WITH MINERALS) tablet Take 1 tablet by mouth 2 (two) times daily.   . potassium chloride SA (K-DUR,KLOR-CON) 20 MEQ tablet Take 40 mEq by mouth 2 (two) times daily.  . predniSONE (DELTASONE) 20 MG tablet Take 2 tablets (40 mg total) by mouth daily with breakfast.  . tiotropium (SPIRIVA HANDIHALER) 18 MCG inhalation capsule INHALE THE CONTENTS OF 1 CAPSULE EVERY DAY  . torsemide (DEMADEX) 10 MG tablet TAKE 1 TABLET EVERY DAY  . atorvastatin (LIPITOR) 20 MG tablet TAKE 1 TABLET EVERY DAY (Patient not taking: Reported on 03/16/2016)  . B-D ULTRA-FINE 33 LANCETS MISC Use to help check blood sugars daily Dx E11.9 (Patient not taking: Reported on 03/16/2016)  . glucose blood (BAYER CONTOUR TEST) test strip 1 each by Other route daily. Use to check blood sugars every day Dx E11.9 (Patient not taking: Reported on 03/16/2016)  . HYDROcodone-homatropine (HYCODAN) 5-1.5 MG/5ML syrup Take 5 mLs by mouth every  8 (eight) hours as needed for cough. (Patient not taking: Reported on 03/16/2016)  . ketoconazole (NIZORAL) 2 % cream Apply 1 application topically daily. (Patient not taking: Reported on 03/16/2016)  . levalbuterol (XOPENEX HFA) 45 MCG/ACT inhaler Inhale 2 puffs into the lungs every 6 (six) hours as needed. Reported on 09/23/2015  . levalbuterol (XOPENEX) 0.63 MG/3ML nebulizer solution Take 3 mLs (0.63 mg total) by nebulization every 6 (six) hours as needed for wheezing or shortness of breath. (Patient not taking: Reported on  03/16/2016)  . meloxicam (MOBIC) 7.5 MG tablet Take 1 tablet (7.5 mg total) by mouth 2 (two) times daily. (Patient not taking: Reported on 03/16/2016)   No facility-administered encounter medications on file as of 03/16/2016.     Allergies as of 03/21/202018 - Review Complete 03/21/202018  Allergen Reaction Noted  . Cholestatin Other (See Comments) 09/02/2010  . Sulfur Itching 01/13/2013    Past Medical History:  Diagnosis Date  . Acute blood loss anemia 04/20/2012  . AF (atrial fibrillation) ([place])     AV ablation 9/09 [facility] per Dr Ola Spurr - AV node ablation 9/11 Dr Caryl Comes  . Amiodarone pulmonary toxicity   . Asthma   . Bronchitis    hx of   . [name] (coronary artery disease)    (not sure of this 11/10  . CHF (congestive heart failure) ([place])   . Chronic renal insufficiency, stage III (moderate)    CrCl about 60 ml/min  . Complication of anesthesia    "psycotic episode" after hip surg - resolved  . COPD (chronic obstructive pulmonary disease) ([facility])    emphysema -FeV1 73% DLCO 53% 5/09  . CVA (cerebral vascular accident) ([place])    no residual effects evident to pt   . Depression 06/06/2012  . Diabetes ([facility])   . Diastolic heart failure    Acute on Chronic  . Eczema   . GERD (gastroesophageal reflux disease)   . History of skin cancer   . Hx of cardiovascular stress test    Lexiscan Myoview (09/2013):  No definite ischemia, EF 67%; low risk  . Hyperlipidemia   . Invasive ductal carcinoma of breast ([facility]) 2011   LEFT   . Mental disorder   . OA (osteoarthritis) of knee    RIGHT  . Pacemaker    Permanent  . PAD (peripheral artery disease) ([facility])    w/hx right iliac/SFA stenting and left and rt leg PTA  . Pain    pt states has pain in fingertips per right hand pt states has been told may be carpal tunnel  . Pneumonia    hx of   . Right sided sciatica   . Shortness of breath dyspnea    walking distances   . Sinoatrial node dysfunction ([facility])   . Sleep apnea    associated with  hypersomnia uses O2 2L/M at night and during naps also uses CPAP  . Tobacco abuse   . Tremors of nervous system    hands bilat     Past Surgical History:  Procedure Laterality Date  . ATRIAL ABLATION SURGERY     x 2 - "did not help - had to have pacemaker"  . BREAST LUMPECTOMY     left breast  . CARDIAC CATHETERIZATION    . CATARACT EXTRACTION, BILATERAL     with IOL/Dr Katy Fitch  . EP IMPLANTABLE DEVICE N/A 02/13/2015   Procedure: PPM Generator Changeout-St. Jude device;  Surgeon: Deboraha Sprang, MD;  Location: [facility];  Service:  Cardiovascular;  Laterality: N/A;  . MASTECTOMY, PARTIAL  02/03/2010   Left/Dr Rosenbower  . ORIF ACETABULAR FRACTURE Left 04/19/2012   Procedure: OPEN REDUCTION INTERNAL FIXATION (ORIF) ACETABULAR FRACTURE;  Surgeon: Rozanna Box, MD;  Location: [facility];  Service: Orthopedics;  Laterality: Left;  . sun spot removed      forhead  . TOTAL HIP ARTHROPLASTY Left 10/31/2014   Procedure: LEFT TOTAL HIP ARTHROPLASTY POSTERIOR  APPROACH;  Surgeon: Gaynelle Arabian, MD;  Location: [facility];  Service: Orthopedics;  Laterality: Left;  . TOTAL KNEE ARTHROPLASTY Right 10/16/2013   Procedure: RIGHT TOTAL KNEE ARTHROPLASTY;  Surgeon: Gearlean Alf, MD;  Location: [facility];  Service: Orthopedics;  Laterality: Right;  Marland Kitchen VASCULAR SURGERY     both legs     Family History  Problem Relation Age of Onset  . Heart disease Father     Social History   Social History  . Marital status: Married    Spouse name: N/A  . Number of children: N/A  . Years of education: N/A   Occupational History  . Not on file.   Social History Main Topics  . Smoking status: Former Smoker    Packs/day: 1.00    Years: 55.00    Types: Cigarettes    Quit date: 12/19/2011  . Smokeless tobacco: Never Used     Comment: started smoking at age 23--4 cigs per day  . Alcohol use Yes     Comment: wine (rare)  . Drug use: No  . Sexual activity: No   Other Topics Concern  . Not on file    Social History Narrative   HS Graduate; [employer].  Married '61.  2 sons - '70, '71; Dtrs - '64,'68;    6 grandchildren.  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 Interests -Tai-chi & water aerobics, gardening, active lifestyle.  Marriage a bit stressful - SO w/membory problems and difficult behavior. She denies any personal safety concerns. End of life care: need to address at next OV              Review of systems: Review of Systems  Constitutional: Negative for fever and chills.  HENT: Negative.   Eyes: Negative for blurred vision.  Respiratory: as per HPI  Cardiovascular: Negative for chest pain and palpitations.  Gastrointestinal: Negative for vomiting, diarrhea, blood per rectum. Genitourinary: Negative for dysuria, urgency, frequency and hematuria.  Musculoskeletal: Negative for myalgias, back pain and joint pain.  Skin: Negative for itching and rash.  Neurological: Negative for dizziness, tremors, focal weakness, seizures and loss of consciousness.  Endo/Heme/Allergies: Negative for environmental allergies.  Psychiatric/Behavioral: Negative for depression, suicidal ideas and hallucinations.  All other systems reviewed and are negative.  Physical Exam: Blood pressure 110/68, pulse 71, height 5' 5" (1.651 m), weight 174 lb 6.4 oz (79.1 kg), SpO2 92 %. Gen:      No acute distress HEENT:  EOMI, sclera anicteric Neck:     No masses; no thyromegaly Lungs:    Clear to auscultation bilaterally; normal respiratory effort CV:         Regular rate and rhythm; no murmurs Abd:      + bowel sounds; soft, non-tender; no palpable masses, no distension Ext:    No edema; adequate peripheral perfusion Skin:      Warm and dry; no rash Neuro: alert and oriented x  3 Psych: normal mood and affect  Data Reviewed: CXR  (10/24/14) No active cardiopulmonary disease. Images reviewed  PFTs (02/20/10) FVC 1.66 L/S (56%) FEV1 1.289 L/S (59%) F/F 74   Assessment:  #1 COPD. She is recovering from a recent COPD exacerbation on doxycyline and prednisone. She is feeling better today. No desaturation were noted in office on ambulation today. I have asked her to continue on the spiriva and advair. She will also continue the supplemental O2 at night and with CPAP. There is a question of history of amiodarone pulmonary toxicity but lung imaging has been clear and she's been off the amiodarone for many years now. I will get a CXR today to make sure there is no new infiltrate.   #2 Heavy ex smoker. She quit 3 years ago. She will not qualify for a screening CT program of the chest given her age.  #3 OSA She is compliant with his CPAP with no issues  Plan/Recommendations: - Continue Advair, spiriva - CXR today - Finish your course of prednisone and doxycyline  Follow up in 6 months  Marshell Garfinkel MD [facility] Pager [phone] 03/16/2016, 4:45 PM  CC: Pricilla Holm A, *   Addendum: CXR unable to be done as pt refused

## 2016-03-17 DIAGNOSIS — R2689 Other abnormalities of gait and mobility: Secondary | ICD-10-CM | POA: Diagnosis not present

## 2016-03-17 DIAGNOSIS — R278 Other lack of coordination: Secondary | ICD-10-CM | POA: Diagnosis not present

## 2016-03-17 DIAGNOSIS — M6281 Muscle weakness (generalized): Secondary | ICD-10-CM | POA: Diagnosis not present

## 2016-03-17 DIAGNOSIS — R2681 Unsteadiness on feet: Secondary | ICD-10-CM | POA: Diagnosis not present

## 2016-03-18 ENCOUNTER — Telehealth: Payer: Self-pay | Admitting: *Deleted

## 2016-03-18 DIAGNOSIS — R2689 Other abnormalities of gait and mobility: Secondary | ICD-10-CM | POA: Diagnosis not present

## 2016-03-18 DIAGNOSIS — R2681 Unsteadiness on feet: Secondary | ICD-10-CM | POA: Diagnosis not present

## 2016-03-18 DIAGNOSIS — R278 Other lack of coordination: Secondary | ICD-10-CM | POA: Diagnosis not present

## 2016-03-18 DIAGNOSIS — M6281 Muscle weakness (generalized): Secondary | ICD-10-CM | POA: Diagnosis not present

## 2016-03-18 NOTE — Telephone Encounter (Signed)
Patient called and stated that [name] now has the atenolol in stock. She would like this to be refilled. Medication is not listed on snapshot as it was discontinued on 11/14/15. Okay to refill and if so should it be 25 mg qd? Please advise. Thanks, MI

## 2016-03-19 DIAGNOSIS — M6281 Muscle weakness (generalized): Secondary | ICD-10-CM | POA: Diagnosis not present

## 2016-03-19 DIAGNOSIS — R2689 Other abnormalities of gait and mobility: Secondary | ICD-10-CM | POA: Diagnosis not present

## 2016-03-19 DIAGNOSIS — R278 Other lack of coordination: Secondary | ICD-10-CM | POA: Diagnosis not present

## 2016-03-19 DIAGNOSIS — R2681 Unsteadiness on feet: Secondary | ICD-10-CM | POA: Diagnosis not present

## 2016-03-19 NOTE — Telephone Encounter (Signed)
Dr. Caryl Comes- is there any reason why she needs to be back on atenolol?

## 2016-03-19 NOTE — Telephone Encounter (Signed)
Reviewed with Dr. Caryl Comes- the patient was not taking atenolol when she came in to see him- there is not a reason for her to be on it currently.  I called the patient to discuss atenolol with her. She was taking this for her tremors. She is currently off the atenolol and states she is a "mess at dinner" in regards to her tremors. I reviewed dosing with the patient- she states she was taking atenolol 25 mg BID. I advised I will need to review with Dr. Caryl Comes prior to filling this for her.   She is requesting a call back from him personally stating "I need to speak with him."  She also reports she lost her transmitter box when she moved and needs another one.  I advised her I will notify the device clinic of this. She is agreeable.

## 2016-03-20 ENCOUNTER — Other Ambulatory Visit: Payer: Self-pay | Admitting: *Deleted

## 2016-03-20 DIAGNOSIS — M6281 Muscle weakness (generalized): Secondary | ICD-10-CM | POA: Diagnosis not present

## 2016-03-20 DIAGNOSIS — R2689 Other abnormalities of gait and mobility: Secondary | ICD-10-CM | POA: Diagnosis not present

## 2016-03-20 DIAGNOSIS — R278 Other lack of coordination: Secondary | ICD-10-CM | POA: Diagnosis not present

## 2016-03-20 DIAGNOSIS — R2681 Unsteadiness on feet: Secondary | ICD-10-CM | POA: Diagnosis not present

## 2016-03-20 MED ORDER — ATENOLOL 25 MG PO TABS: 25.0000 mg | ORAL_TABLET | Freq: Two times a day (BID) | ORAL | 3 refills | Status: DC

## 2016-03-20 MED ORDER — ATENOLOL 25 MG PO TABS: 25.0000 mg | ORAL_TABLET | Freq: Two times a day (BID) | ORAL | Status: DC

## 2016-03-20 NOTE — Telephone Encounter (Signed)
Spoke with patient to clarify which pharmacy she wanted this sent to. She requested [facility]. She is aware that I will send rx. She reiterated that she would like a call from Dr Caryl Comes as well.

## 2016-03-20 NOTE — Telephone Encounter (Signed)
Reviewed with Dr. Caryl Comes that tremors is the reason for the patient taking atenolol. He advised to go ahead and re-order for the patient at her last dose of atenolol 25 mg BID.  I advised him to please call the patient per her request.   Mindy, can you please clarify with the patient which pharmacy she wants this sent to? I just came in to catch up a little bit today, but have put the atenolol back on her list.  Thanks!

## 2016-03-23 DIAGNOSIS — M6281 Muscle weakness (generalized): Secondary | ICD-10-CM | POA: Diagnosis not present

## 2016-03-23 DIAGNOSIS — R2689 Other abnormalities of gait and mobility: Secondary | ICD-10-CM | POA: Diagnosis not present

## 2016-03-23 DIAGNOSIS — R2681 Unsteadiness on feet: Secondary | ICD-10-CM | POA: Diagnosis not present

## 2016-03-23 DIAGNOSIS — R278 Other lack of coordination: Secondary | ICD-10-CM | POA: Diagnosis not present

## 2016-03-24 DIAGNOSIS — R2681 Unsteadiness on feet: Secondary | ICD-10-CM | POA: Diagnosis not present

## 2016-03-24 DIAGNOSIS — M6281 Muscle weakness (generalized): Secondary | ICD-10-CM | POA: Diagnosis not present

## 2016-03-24 DIAGNOSIS — R2689 Other abnormalities of gait and mobility: Secondary | ICD-10-CM | POA: Diagnosis not present

## 2016-03-24 DIAGNOSIS — R278 Other lack of coordination: Secondary | ICD-10-CM | POA: Diagnosis not present

## 2016-03-25 DIAGNOSIS — R2681 Unsteadiness on feet: Secondary | ICD-10-CM | POA: Diagnosis not present

## 2016-03-25 DIAGNOSIS — R2689 Other abnormalities of gait and mobility: Secondary | ICD-10-CM | POA: Diagnosis not present

## 2016-03-25 DIAGNOSIS — M6281 Muscle weakness (generalized): Secondary | ICD-10-CM | POA: Diagnosis not present

## 2016-03-25 DIAGNOSIS — R278 Other lack of coordination: Secondary | ICD-10-CM | POA: Diagnosis not present

## 2016-03-26 DIAGNOSIS — M6281 Muscle weakness (generalized): Secondary | ICD-10-CM | POA: Diagnosis not present

## 2016-03-26 DIAGNOSIS — R2681 Unsteadiness on feet: Secondary | ICD-10-CM | POA: Diagnosis not present

## 2016-03-26 DIAGNOSIS — R278 Other lack of coordination: Secondary | ICD-10-CM | POA: Diagnosis not present

## 2016-03-26 DIAGNOSIS — R2689 Other abnormalities of gait and mobility: Secondary | ICD-10-CM | POA: Diagnosis not present

## 2016-03-27 DIAGNOSIS — R2681 Unsteadiness on feet: Secondary | ICD-10-CM | POA: Diagnosis not present

## 2016-03-27 DIAGNOSIS — R278 Other lack of coordination: Secondary | ICD-10-CM | POA: Diagnosis not present

## 2016-03-27 DIAGNOSIS — M6281 Muscle weakness (generalized): Secondary | ICD-10-CM | POA: Diagnosis not present

## 2016-03-27 DIAGNOSIS — R2689 Other abnormalities of gait and mobility: Secondary | ICD-10-CM | POA: Diagnosis not present

## 2016-03-27 NOTE — Telephone Encounter (Signed)
Dr. Caryl Comes was made aware on 1/11 & again on 1/16 that the patient wanted to speak with him. He stated he would call her.

## 2016-03-30 DIAGNOSIS — R2689 Other abnormalities of gait and mobility: Secondary | ICD-10-CM | POA: Diagnosis not present

## 2016-03-30 DIAGNOSIS — R2681 Unsteadiness on feet: Secondary | ICD-10-CM | POA: Diagnosis not present

## 2016-03-30 DIAGNOSIS — R278 Other lack of coordination: Secondary | ICD-10-CM | POA: Diagnosis not present

## 2016-03-30 DIAGNOSIS — M6281 Muscle weakness (generalized): Secondary | ICD-10-CM | POA: Diagnosis not present

## 2016-03-31 DIAGNOSIS — M6281 Muscle weakness (generalized): Secondary | ICD-10-CM | POA: Diagnosis not present

## 2016-03-31 DIAGNOSIS — R2689 Other abnormalities of gait and mobility: Secondary | ICD-10-CM | POA: Diagnosis not present

## 2016-03-31 DIAGNOSIS — R278 Other lack of coordination: Secondary | ICD-10-CM | POA: Diagnosis not present

## 2016-03-31 DIAGNOSIS — R2681 Unsteadiness on feet: Secondary | ICD-10-CM | POA: Diagnosis not present

## 2016-04-01 DIAGNOSIS — M6281 Muscle weakness (generalized): Secondary | ICD-10-CM | POA: Diagnosis not present

## 2016-04-01 DIAGNOSIS — R2689 Other abnormalities of gait and mobility: Secondary | ICD-10-CM | POA: Diagnosis not present

## 2016-04-01 DIAGNOSIS — R2681 Unsteadiness on feet: Secondary | ICD-10-CM | POA: Diagnosis not present

## 2016-04-01 DIAGNOSIS — R278 Other lack of coordination: Secondary | ICD-10-CM | POA: Diagnosis not present

## 2016-04-02 DIAGNOSIS — R278 Other lack of coordination: Secondary | ICD-10-CM | POA: Diagnosis not present

## 2016-04-02 DIAGNOSIS — R2681 Unsteadiness on feet: Secondary | ICD-10-CM | POA: Diagnosis not present

## 2016-04-02 DIAGNOSIS — R2689 Other abnormalities of gait and mobility: Secondary | ICD-10-CM | POA: Diagnosis not present

## 2016-04-02 DIAGNOSIS — M6281 Muscle weakness (generalized): Secondary | ICD-10-CM | POA: Diagnosis not present

## 2016-04-03 ENCOUNTER — Other Ambulatory Visit: Payer: Self-pay | Admitting: Internal Medicine

## 2016-04-03 DIAGNOSIS — R278 Other lack of coordination: Secondary | ICD-10-CM | POA: Diagnosis not present

## 2016-04-03 DIAGNOSIS — R2689 Other abnormalities of gait and mobility: Secondary | ICD-10-CM | POA: Diagnosis not present

## 2016-04-03 DIAGNOSIS — M6281 Muscle weakness (generalized): Secondary | ICD-10-CM | POA: Diagnosis not present

## 2016-04-03 DIAGNOSIS — R2681 Unsteadiness on feet: Secondary | ICD-10-CM | POA: Diagnosis not present

## 2016-04-06 DIAGNOSIS — R2681 Unsteadiness on feet: Secondary | ICD-10-CM | POA: Diagnosis not present

## 2016-04-06 DIAGNOSIS — R2689 Other abnormalities of gait and mobility: Secondary | ICD-10-CM | POA: Diagnosis not present

## 2016-04-06 DIAGNOSIS — M6281 Muscle weakness (generalized): Secondary | ICD-10-CM | POA: Diagnosis not present

## 2016-04-06 DIAGNOSIS — R278 Other lack of coordination: Secondary | ICD-10-CM | POA: Diagnosis not present

## 2016-04-07 ENCOUNTER — Telehealth: Payer: Self-pay | Admitting: Internal Medicine

## 2016-04-07 DIAGNOSIS — R278 Other lack of coordination: Secondary | ICD-10-CM | POA: Diagnosis not present

## 2016-04-07 DIAGNOSIS — R2681 Unsteadiness on feet: Secondary | ICD-10-CM | POA: Diagnosis not present

## 2016-04-07 DIAGNOSIS — M6281 Muscle weakness (generalized): Secondary | ICD-10-CM | POA: Diagnosis not present

## 2016-04-07 DIAGNOSIS — R2689 Other abnormalities of gait and mobility: Secondary | ICD-10-CM | POA: Diagnosis not present

## 2016-04-07 NOTE — Telephone Encounter (Signed)
error 

## 2016-04-08 DIAGNOSIS — M6281 Muscle weakness (generalized): Secondary | ICD-10-CM | POA: Diagnosis not present

## 2016-04-08 DIAGNOSIS — R2689 Other abnormalities of gait and mobility: Secondary | ICD-10-CM | POA: Diagnosis not present

## 2016-04-08 DIAGNOSIS — R278 Other lack of coordination: Secondary | ICD-10-CM | POA: Diagnosis not present

## 2016-04-08 DIAGNOSIS — R2681 Unsteadiness on feet: Secondary | ICD-10-CM | POA: Diagnosis not present

## 2016-04-09 DIAGNOSIS — M6281 Muscle weakness (generalized): Secondary | ICD-10-CM | POA: Diagnosis not present

## 2016-04-09 DIAGNOSIS — R2689 Other abnormalities of gait and mobility: Secondary | ICD-10-CM | POA: Diagnosis not present

## 2016-04-09 DIAGNOSIS — R278 Other lack of coordination: Secondary | ICD-10-CM | POA: Diagnosis not present

## 2016-04-09 DIAGNOSIS — R2681 Unsteadiness on feet: Secondary | ICD-10-CM | POA: Diagnosis not present

## 2016-04-10 ENCOUNTER — Ambulatory Visit: Payer: Medicare Other | Admitting: Internal Medicine

## 2016-04-13 DIAGNOSIS — R278 Other lack of coordination: Secondary | ICD-10-CM | POA: Diagnosis not present

## 2016-04-13 DIAGNOSIS — M6281 Muscle weakness (generalized): Secondary | ICD-10-CM | POA: Diagnosis not present

## 2016-04-13 DIAGNOSIS — R2681 Unsteadiness on feet: Secondary | ICD-10-CM | POA: Diagnosis not present

## 2016-04-13 DIAGNOSIS — R2689 Other abnormalities of gait and mobility: Secondary | ICD-10-CM | POA: Diagnosis not present

## 2016-04-14 DIAGNOSIS — R2689 Other abnormalities of gait and mobility: Secondary | ICD-10-CM | POA: Diagnosis not present

## 2016-04-14 DIAGNOSIS — M6281 Muscle weakness (generalized): Secondary | ICD-10-CM | POA: Diagnosis not present

## 2016-04-14 DIAGNOSIS — R2681 Unsteadiness on feet: Secondary | ICD-10-CM | POA: Diagnosis not present

## 2016-04-14 DIAGNOSIS — R278 Other lack of coordination: Secondary | ICD-10-CM | POA: Diagnosis not present

## 2016-04-20 ENCOUNTER — Encounter: Payer: Self-pay | Admitting: Internal Medicine

## 2016-04-20 ENCOUNTER — Ambulatory Visit (INDEPENDENT_AMBULATORY_CARE_PROVIDER_SITE_OTHER): Payer: Medicare Other | Admitting: Internal Medicine

## 2016-04-20 DIAGNOSIS — M6281 Muscle weakness (generalized): Secondary | ICD-10-CM | POA: Diagnosis not present

## 2016-04-20 DIAGNOSIS — J449 Chronic obstructive pulmonary disease, unspecified: Secondary | ICD-10-CM

## 2016-04-20 DIAGNOSIS — R2681 Unsteadiness on feet: Secondary | ICD-10-CM | POA: Diagnosis not present

## 2016-04-20 DIAGNOSIS — R278 Other lack of coordination: Secondary | ICD-10-CM | POA: Diagnosis not present

## 2016-04-20 DIAGNOSIS — R2689 Other abnormalities of gait and mobility: Secondary | ICD-10-CM | POA: Diagnosis not present

## 2016-04-20 MED ORDER — MONTELUKAST SODIUM 10 MG PO TABS: 10.0000 mg | ORAL_TABLET | Freq: Every day | ORAL | 3 refills | Status: DC

## 2016-04-20 NOTE — Progress Notes (Signed)
Pre visit review using our clinic review tool, if applicable. No additional management support is needed unless otherwise documented below in the visit note. 

## 2016-04-20 NOTE — Progress Notes (Signed)
   Subjective:    Patient ID: [name], female    DOB: 1931-07-22, 83 y.o.   MRN: [id number]  HPI The patient is an 83 YO female coming in for wheelchair evaluation but she is feeling sick and wants sick visit for her breathing instead. After our last visit she took the doxycycline and prednisone and felt some better. She saw the lung specialist but it was too soon after and she was still feeling poorly. She has had consistent drainage since that time in her nose which is bothering her chest. She is getting out of breath more easily. Denies fevers or chills.   Review of Systems  Constitutional: Positive for activity change and fatigue. Negative for appetite change, chills, fever and unexpected weight change.  HENT: Positive for congestion, postnasal drip and rhinorrhea. Negative for ear discharge, ear pain, sinus pain, sinus pressure, sore throat and trouble swallowing.   Eyes: Negative.   Respiratory: Positive for cough and shortness of breath. Negative for chest tightness and wheezing.   Cardiovascular: Negative.   Gastrointestinal: Negative.   Musculoskeletal: Negative.       Objective:   Physical Exam  Constitutional: She appears well-developed and well-nourished.  HENT:  Head: Normocephalic and atraumatic.  Oropharynx with redness and clear drainage.   Eyes: EOM are normal.  Cardiovascular: Normal rate and regular rhythm.   Pulmonary/Chest: Effort normal. No respiratory distress. She has no wheezes.  Some expiratory wheezing which is stable from prior.   Abdominal: Soft.  Skin: Skin is warm and dry.   Vitals:   04/20/16 1040  BP: 98/70  Pulse: 90  Temp: 97.9 F (36.6 C)  TempSrc: Oral  SpO2: 91%  Weight: 172 lb (78 kg)  Height: '5\' 5"'$  (1.651 m)      Assessment & Plan:

## 2016-04-20 NOTE — Patient Instructions (Addendum)
We have sent in the singulair for the breathing to help dry up the mucus.   We can see you back for the wheelchair assessment.

## 2016-04-21 DIAGNOSIS — R2689 Other abnormalities of gait and mobility: Secondary | ICD-10-CM | POA: Diagnosis not present

## 2016-04-21 DIAGNOSIS — M6281 Muscle weakness (generalized): Secondary | ICD-10-CM | POA: Diagnosis not present

## 2016-04-21 DIAGNOSIS — R278 Other lack of coordination: Secondary | ICD-10-CM | POA: Diagnosis not present

## 2016-04-21 DIAGNOSIS — R2681 Unsteadiness on feet: Secondary | ICD-10-CM | POA: Diagnosis not present

## 2016-04-21 NOTE — Assessment & Plan Note (Signed)
She does not need antibiotics or steroids today. She will start taking singulair for congestion and drainage. She will continue her xopenex and spiriva. If still having SOB can change spiriva to anoro.

## 2016-04-22 DIAGNOSIS — R2689 Other abnormalities of gait and mobility: Secondary | ICD-10-CM | POA: Diagnosis not present

## 2016-04-22 DIAGNOSIS — R278 Other lack of coordination: Secondary | ICD-10-CM | POA: Diagnosis not present

## 2016-04-22 DIAGNOSIS — M6281 Muscle weakness (generalized): Secondary | ICD-10-CM | POA: Diagnosis not present

## 2016-04-22 DIAGNOSIS — R2681 Unsteadiness on feet: Secondary | ICD-10-CM | POA: Diagnosis not present

## 2016-04-23 DIAGNOSIS — R2689 Other abnormalities of gait and mobility: Secondary | ICD-10-CM | POA: Diagnosis not present

## 2016-04-23 DIAGNOSIS — R278 Other lack of coordination: Secondary | ICD-10-CM | POA: Diagnosis not present

## 2016-04-23 DIAGNOSIS — M6281 Muscle weakness (generalized): Secondary | ICD-10-CM | POA: Diagnosis not present

## 2016-04-23 DIAGNOSIS — R2681 Unsteadiness on feet: Secondary | ICD-10-CM | POA: Diagnosis not present

## 2016-04-24 DIAGNOSIS — M6281 Muscle weakness (generalized): Secondary | ICD-10-CM | POA: Diagnosis not present

## 2016-04-24 DIAGNOSIS — R2689 Other abnormalities of gait and mobility: Secondary | ICD-10-CM | POA: Diagnosis not present

## 2016-04-24 DIAGNOSIS — R2681 Unsteadiness on feet: Secondary | ICD-10-CM | POA: Diagnosis not present

## 2016-04-24 DIAGNOSIS — R278 Other lack of coordination: Secondary | ICD-10-CM | POA: Diagnosis not present

## 2016-04-25 ENCOUNTER — Observation Stay (HOSPITAL_COMMUNITY): Payer: Medicare Other

## 2016-04-25 ENCOUNTER — Inpatient Hospital Stay (HOSPITAL_COMMUNITY)
Admission: EM | Admit: 2016-04-25 | Discharge: 2016-05-03 | DRG: 871 | Disposition: A | Discharge status: Skilled Nursing Facility | Payer: Medicare Other | Attending: Internal Medicine | Admitting: Internal Medicine

## 2016-04-25 ENCOUNTER — Emergency Department (HOSPITAL_COMMUNITY): Payer: Medicare Other

## 2016-04-25 ENCOUNTER — Encounter (HOSPITAL_COMMUNITY): Payer: Self-pay | Admitting: Emergency Medicine

## 2016-04-25 DIAGNOSIS — R6521 Severe sepsis with septic shock: Secondary | ICD-10-CM | POA: Diagnosis not present

## 2016-04-25 DIAGNOSIS — R059 Cough, unspecified: Secondary | ICD-10-CM

## 2016-04-25 DIAGNOSIS — J9621 Acute and chronic respiratory failure with hypoxia: Secondary | ICD-10-CM | POA: Diagnosis not present

## 2016-04-25 DIAGNOSIS — E1122 Type 2 diabetes mellitus with diabetic chronic kidney disease: Secondary | ICD-10-CM | POA: Diagnosis present

## 2016-04-25 DIAGNOSIS — Z853 Personal history of malignant neoplasm of breast: Secondary | ICD-10-CM

## 2016-04-25 DIAGNOSIS — I5043 Acute on chronic combined systolic (congestive) and diastolic (congestive) heart failure: Secondary | ICD-10-CM | POA: Diagnosis not present

## 2016-04-25 DIAGNOSIS — J439 Emphysema, unspecified: Secondary | ICD-10-CM | POA: Diagnosis present

## 2016-04-25 DIAGNOSIS — I4891 Unspecified atrial fibrillation: Secondary | ICD-10-CM | POA: Diagnosis present

## 2016-04-25 DIAGNOSIS — J9690 Respiratory failure, unspecified, unspecified whether with hypoxia or hypercapnia: Secondary | ICD-10-CM

## 2016-04-25 DIAGNOSIS — D631 Anemia in chronic kidney disease: Secondary | ICD-10-CM | POA: Diagnosis present

## 2016-04-25 DIAGNOSIS — E860 Dehydration: Secondary | ICD-10-CM | POA: Diagnosis present

## 2016-04-25 DIAGNOSIS — J189 Pneumonia, unspecified organism: Secondary | ICD-10-CM | POA: Diagnosis not present

## 2016-04-25 DIAGNOSIS — E118 Type 2 diabetes mellitus with unspecified complications: Secondary | ICD-10-CM

## 2016-04-25 DIAGNOSIS — Z96651 Presence of right artificial knee joint: Secondary | ICD-10-CM | POA: Diagnosis present

## 2016-04-25 DIAGNOSIS — Z9842 Cataract extraction status, left eye: Secondary | ICD-10-CM

## 2016-04-25 DIAGNOSIS — A419 Sepsis, unspecified organism: Secondary | ICD-10-CM | POA: Diagnosis not present

## 2016-04-25 DIAGNOSIS — F329 Major depressive disorder, single episode, unspecified: Secondary | ICD-10-CM | POA: Diagnosis present

## 2016-04-25 DIAGNOSIS — Z9841 Cataract extraction status, right eye: Secondary | ICD-10-CM

## 2016-04-25 DIAGNOSIS — N183 Chronic kidney disease, stage 3 (moderate): Secondary | ICD-10-CM | POA: Diagnosis present

## 2016-04-25 DIAGNOSIS — Z79899 Other long term (current) drug therapy: Secondary | ICD-10-CM

## 2016-04-25 DIAGNOSIS — Z7984 Long term (current) use of oral hypoglycemic drugs: Secondary | ICD-10-CM

## 2016-04-25 DIAGNOSIS — R0602 Shortness of breath: Secondary | ICD-10-CM | POA: Diagnosis not present

## 2016-04-25 DIAGNOSIS — Z8673 Personal history of transient ischemic attack (TIA), and cerebral infarction without residual deficits: Secondary | ICD-10-CM

## 2016-04-25 DIAGNOSIS — R1011 Right upper quadrant pain: Secondary | ICD-10-CM | POA: Diagnosis not present

## 2016-04-25 DIAGNOSIS — R10811 Right upper quadrant abdominal tenderness: Secondary | ICD-10-CM

## 2016-04-25 DIAGNOSIS — F32A Depression, unspecified: Secondary | ICD-10-CM | POA: Diagnosis present

## 2016-04-25 DIAGNOSIS — I5032 Chronic diastolic (congestive) heart failure: Secondary | ICD-10-CM

## 2016-04-25 DIAGNOSIS — Z9981 Dependence on supplemental oxygen: Secondary | ICD-10-CM

## 2016-04-25 DIAGNOSIS — I248 Other forms of acute ischemic heart disease: Secondary | ICD-10-CM | POA: Diagnosis present

## 2016-04-25 DIAGNOSIS — Z950 Presence of cardiac pacemaker: Secondary | ICD-10-CM

## 2016-04-25 DIAGNOSIS — I959 Hypotension, unspecified: Secondary | ICD-10-CM | POA: Diagnosis not present

## 2016-04-25 DIAGNOSIS — R05 Cough: Secondary | ICD-10-CM

## 2016-04-25 DIAGNOSIS — G894 Chronic pain syndrome: Secondary | ICD-10-CM | POA: Diagnosis present

## 2016-04-25 DIAGNOSIS — Z96642 Presence of left artificial hip joint: Secondary | ICD-10-CM | POA: Diagnosis present

## 2016-04-25 DIAGNOSIS — I5021 Acute systolic (congestive) heart failure: Secondary | ICD-10-CM

## 2016-04-25 DIAGNOSIS — Z22322 Carrier or suspected carrier of Methicillin resistant Staphylococcus aureus: Secondary | ICD-10-CM

## 2016-04-25 DIAGNOSIS — Z85828 Personal history of other malignant neoplasm of skin: Secondary | ICD-10-CM

## 2016-04-25 DIAGNOSIS — E1151 Type 2 diabetes mellitus with diabetic peripheral angiopathy without gangrene: Secondary | ICD-10-CM | POA: Diagnosis present

## 2016-04-25 DIAGNOSIS — R570 Cardiogenic shock: Secondary | ICD-10-CM | POA: Diagnosis present

## 2016-04-25 DIAGNOSIS — G4733 Obstructive sleep apnea (adult) (pediatric): Secondary | ICD-10-CM | POA: Diagnosis present

## 2016-04-25 DIAGNOSIS — J9600 Acute respiratory failure, unspecified whether with hypoxia or hypercapnia: Secondary | ICD-10-CM | POA: Diagnosis present

## 2016-04-25 DIAGNOSIS — K5900 Constipation, unspecified: Secondary | ICD-10-CM | POA: Diagnosis not present

## 2016-04-25 DIAGNOSIS — N179 Acute kidney failure, unspecified: Secondary | ICD-10-CM | POA: Diagnosis present

## 2016-04-25 DIAGNOSIS — I481 Persistent atrial fibrillation: Secondary | ICD-10-CM | POA: Diagnosis present

## 2016-04-25 DIAGNOSIS — E785 Hyperlipidemia, unspecified: Secondary | ICD-10-CM | POA: Diagnosis present

## 2016-04-25 DIAGNOSIS — I130 Hypertensive heart and chronic kidney disease with heart failure and stage 1 through stage 4 chronic kidney disease, or unspecified chronic kidney disease: Secondary | ICD-10-CM | POA: Diagnosis present

## 2016-04-25 DIAGNOSIS — R6520 Severe sepsis without septic shock: Secondary | ICD-10-CM

## 2016-04-25 DIAGNOSIS — E872 Acidosis: Secondary | ICD-10-CM | POA: Diagnosis present

## 2016-04-25 DIAGNOSIS — Z87891 Personal history of nicotine dependence: Secondary | ICD-10-CM

## 2016-04-25 DIAGNOSIS — K219 Gastro-esophageal reflux disease without esophagitis: Secondary | ICD-10-CM | POA: Diagnosis present

## 2016-04-25 DIAGNOSIS — I2510 Atherosclerotic heart disease of native coronary artery without angina pectoris: Secondary | ICD-10-CM | POA: Diagnosis present

## 2016-04-25 DIAGNOSIS — N390 Urinary tract infection, site not specified: Secondary | ICD-10-CM | POA: Diagnosis present

## 2016-04-25 DIAGNOSIS — E876 Hypokalemia: Secondary | ICD-10-CM | POA: Diagnosis not present

## 2016-04-25 DIAGNOSIS — E878 Other disorders of electrolyte and fluid balance, not elsewhere classified: Secondary | ICD-10-CM | POA: Diagnosis not present

## 2016-04-25 DIAGNOSIS — B952 Enterococcus as the cause of diseases classified elsewhere: Secondary | ICD-10-CM | POA: Diagnosis present

## 2016-04-25 DIAGNOSIS — Y95 Nosocomial condition: Secondary | ICD-10-CM | POA: Diagnosis present

## 2016-04-25 DIAGNOSIS — Z66 Do not resuscitate: Secondary | ICD-10-CM | POA: Diagnosis present

## 2016-04-25 DIAGNOSIS — Z7901 Long term (current) use of anticoagulants: Secondary | ICD-10-CM

## 2016-04-25 HISTORY — DX: Chronic kidney disease, stage 3 (moderate): N18.3

## 2016-04-25 HISTORY — DX: Chronic kidney disease, stage 3 unspecified: N18.30

## 2016-04-25 LAB — CBC WITH DIFFERENTIAL/PLATELET
BASOS ABS: 0 10*3/uL (ref 0.0–0.1)
BASOS PCT: 0 %
EOS ABS: 0 10*3/uL (ref 0.0–0.7)
EOS PCT: 0 %
HCT: 41.2 % (ref 36.0–46.0)
HEMOGLOBIN: 13.4 g/dL (ref 12.0–15.0)
LYMPHS ABS: 0.8 10*3/uL (ref 0.7–4.0)
Lymphocytes Relative: 5 %
MCH: 28.9 pg (ref 26.0–34.0)
MCHC: 32.5 g/dL (ref 30.0–36.0)
MCV: 88.8 fL (ref 78.0–100.0)
Monocytes Absolute: 0.9 10*3/uL (ref 0.1–1.0)
Monocytes Relative: 6 %
NEUTROS PCT: 89 %
Neutro Abs: 14.6 10*3/uL — ABNORMAL HIGH (ref 1.7–7.7)
PLATELETS: 209 10*3/uL (ref 150–400)
RBC: 4.64 MIL/uL (ref 3.87–5.11)
RDW: 15.5 % (ref 11.5–15.5)
WBC: 16.4 10*3/uL — AB (ref 4.0–10.5)

## 2016-04-25 LAB — COMPREHENSIVE METABOLIC PANEL
ALT: 12 U/L — AB (ref 14–54)
AST: 26 U/L (ref 15–41)
Albumin: 3.4 g/dL — ABNORMAL LOW (ref 3.5–5.0)
Alkaline Phosphatase: 67 U/L (ref 38–126)
Anion gap: 13 (ref 5–15)
BILIRUBIN TOTAL: 1.3 mg/dL — AB (ref 0.3–1.2)
BUN: 13 mg/dL (ref 6–20)
CALCIUM: 9.4 mg/dL (ref 8.9–10.3)
CHLORIDE: 106 mmol/L (ref 101–111)
CO2: 22 mmol/L (ref 22–32)
CREATININE: 1.19 mg/dL — AB (ref 0.44–1.00)
GFR, EST AFRICAN AMERICAN: 48 mL/min — AB (ref >60)
GFR, EST NON AFRICAN AMERICAN: 41 mL/min — AB (ref >60)
Glucose, Bld: 210 mg/dL — ABNORMAL HIGH (ref 65–99)
Potassium: 4.4 mmol/L (ref 3.5–5.1)
Sodium: 141 mmol/L (ref 135–145)
TOTAL PROTEIN: 7.1 g/dL (ref 6.5–8.1)

## 2016-04-25 LAB — GLUCOSE, CAPILLARY: Glucose-Capillary: 119 mg/dL — ABNORMAL HIGH (ref 65–99)

## 2016-04-25 LAB — URINALYSIS, ROUTINE W REFLEX MICROSCOPIC
BILIRUBIN URINE: NEGATIVE
GLUCOSE, UA: NEGATIVE mg/dL
HGB URINE DIPSTICK: NEGATIVE
Ketones, ur: NEGATIVE mg/dL
Leukocytes, UA: NEGATIVE
Nitrite: NEGATIVE
PROTEIN: NEGATIVE mg/dL
Specific Gravity, Urine: 1.019 (ref 1.005–1.030)
pH: 5 (ref 5.0–8.0)

## 2016-04-25 LAB — LACTIC ACID, PLASMA
LACTIC ACID, VENOUS: 2.9 mmol/L — AB (ref 0.5–1.9)
Lactic Acid, Venous: 3.2 mmol/L (ref 0.5–1.9)

## 2016-04-25 LAB — MRSA PCR SCREENING: MRSA by PCR: POSITIVE — AB

## 2016-04-25 LAB — INFLUENZA PANEL BY PCR (TYPE A & B)
INFLAPCR: NEGATIVE
Influenza B By PCR: NEGATIVE

## 2016-04-25 LAB — I-STAT CG4 LACTIC ACID, ED: Lactic Acid, Venous: 2.19 mmol/L (ref 0.5–1.9)

## 2016-04-25 LAB — CBG MONITORING, ED: Glucose-Capillary: 152 mg/dL — ABNORMAL HIGH (ref 65–99)

## 2016-04-25 MED ORDER — SODIUM CHLORIDE 0.9% FLUSH
3.0000 mL | Freq: Two times a day (BID) | INTRAVENOUS | Status: DC
  Administered 2016-04-25 – 2016-04-28 (×7): 3 mL via INTRAVENOUS

## 2016-04-25 MED ORDER — APIXABAN 5 MG PO TABS
5.0000 mg | ORAL_TABLET | Freq: Two times a day (BID) | ORAL | Status: DC
  Administered 2016-04-25 – 2016-05-03 (×16): 5 mg via ORAL
  Filled 2016-04-25 (×16): qty 1

## 2016-04-25 MED ORDER — ACETAMINOPHEN 650 MG RE SUPP: 650.0000 mg | Freq: Four times a day (QID) | RECTAL | Status: DC | PRN

## 2016-04-25 MED ORDER — ACETAMINOPHEN 325 MG PO TABS
650.0000 mg | ORAL_TABLET | Freq: Four times a day (QID) | ORAL | Status: DC | PRN
  Administered 2016-04-25 – 2016-04-29 (×2): 650 mg via ORAL
  Filled 2016-04-25 (×4): qty 2

## 2016-04-25 MED ORDER — CHLORHEXIDINE GLUCONATE CLOTH 2 % EX PADS
6.0000 | MEDICATED_PAD | Freq: Every day | CUTANEOUS | Status: DC
  Administered 2016-04-26: 6 via TOPICAL

## 2016-04-25 MED ORDER — ORAL CARE MOUTH RINSE
15.0000 mL | Freq: Two times a day (BID) | OROMUCOSAL | Status: DC
  Administered 2016-04-26 – 2016-04-27 (×3): 15 mL via OROMUCOSAL

## 2016-04-25 MED ORDER — PIPERACILLIN-TAZOBACTAM 3.375 G IVPB
3.3750 g | Freq: Three times a day (TID) | INTRAVENOUS | Status: DC
  Administered 2016-04-25 – 2016-04-28 (×8): 3.375 g via INTRAVENOUS
  Filled 2016-04-25 (×9): qty 50

## 2016-04-25 MED ORDER — GUAIFENESIN ER 600 MG PO TB12
600.0000 mg | ORAL_TABLET | Freq: Two times a day (BID) | ORAL | Status: DC
  Administered 2016-04-25 – 2016-05-03 (×16): 600 mg via ORAL
  Filled 2016-04-25 (×16): qty 1

## 2016-04-25 MED ORDER — SODIUM CHLORIDE 0.9 % IV BOLUS (SEPSIS)
500.0000 mL | Freq: Once | INTRAVENOUS | Status: AC
  Administered 2016-04-25: 500 mL via INTRAVENOUS

## 2016-04-25 MED ORDER — ATENOLOL 25 MG PO TABS
25.0000 mg | ORAL_TABLET | Freq: Two times a day (BID) | ORAL | Status: DC
  Filled 2016-04-25 (×2): qty 1

## 2016-04-25 MED ORDER — SODIUM CHLORIDE 0.9 % IV SOLN
Freq: Once | INTRAVENOUS | Status: AC
  Administered 2016-04-25: 22:00:00 via INTRAVENOUS

## 2016-04-25 MED ORDER — ACETAMINOPHEN 325 MG PO TABS
650.0000 mg | ORAL_TABLET | Freq: Once | ORAL | Status: AC
  Administered 2016-04-25: 650 mg via ORAL

## 2016-04-25 MED ORDER — INSULIN ASPART 100 UNIT/ML ~~LOC~~ SOLN
SUBCUTANEOUS | Status: AC
  Administered 2016-04-25: 22:00:00
  Filled 2016-04-25: qty 1

## 2016-04-25 MED ORDER — ACETAMINOPHEN 325 MG PO TABS
650.0000 mg | ORAL_TABLET | Freq: Once | ORAL | Status: AC
  Administered 2016-04-25: 650 mg via ORAL
  Filled 2016-04-25: qty 2

## 2016-04-25 MED ORDER — SODIUM CHLORIDE 0.9 % IV BOLUS (SEPSIS)
2500.0000 mL | Freq: Once | INTRAVENOUS | Status: AC
  Administered 2016-04-25: 1000 mL via INTRAVENOUS

## 2016-04-25 MED ORDER — ONDANSETRON HCL 4 MG/2ML IJ SOLN
4.0000 mg | Freq: Four times a day (QID) | INTRAMUSCULAR | Status: DC | PRN
  Administered 2016-04-29: 4 mg via INTRAVENOUS
  Filled 2016-04-25: qty 2

## 2016-04-25 MED ORDER — VANCOMYCIN HCL 10 G IV SOLR
1750.0000 mg | Freq: Once | INTRAVENOUS | Status: AC
  Administered 2016-04-25: 1750 mg via INTRAVENOUS
  Filled 2016-04-25: qty 1750

## 2016-04-25 MED ORDER — CHLORHEXIDINE GLUCONATE 0.12 % MT SOLN
15.0000 mL | Freq: Two times a day (BID) | OROMUCOSAL | Status: DC
  Administered 2016-04-26 – 2016-05-03 (×14): 15 mL via OROMUCOSAL
  Filled 2016-04-25 (×13): qty 15

## 2016-04-25 MED ORDER — ATORVASTATIN CALCIUM 20 MG PO TABS
20.0000 mg | ORAL_TABLET | Freq: Every day | ORAL | Status: DC
  Administered 2016-04-26 – 2016-05-02 (×7): 20 mg via ORAL
  Filled 2016-04-25 (×7): qty 1

## 2016-04-25 MED ORDER — SODIUM CHLORIDE 0.9 % IV SOLN
INTRAVENOUS | Status: AC
  Administered 2016-04-25 (×2): via INTRAVENOUS

## 2016-04-25 MED ORDER — ONDANSETRON HCL 4 MG PO TABS: 4.0000 mg | ORAL_TABLET | Freq: Four times a day (QID) | ORAL | Status: DC | PRN

## 2016-04-25 MED ORDER — MUPIROCIN 2 % EX OINT
1.0000 "application " | TOPICAL_OINTMENT | Freq: Two times a day (BID) | CUTANEOUS | Status: AC
  Administered 2016-04-26 – 2016-04-30 (×9): 1 via NASAL
  Filled 2016-04-25 (×2): qty 22

## 2016-04-25 MED ORDER — INSULIN ASPART 100 UNIT/ML ~~LOC~~ SOLN
0.0000 [IU] | Freq: Three times a day (TID) | SUBCUTANEOUS | Status: DC
  Administered 2016-04-25: 4 [IU] via SUBCUTANEOUS
  Administered 2016-04-26: 3 [IU] via SUBCUTANEOUS
  Administered 2016-04-26 – 2016-04-27 (×4): 4 [IU] via SUBCUTANEOUS
  Administered 2016-04-27 – 2016-04-28 (×2): 3 [IU] via SUBCUTANEOUS
  Administered 2016-04-28: 4 [IU] via SUBCUTANEOUS
  Administered 2016-04-29 – 2016-05-03 (×4): 3 [IU] via SUBCUTANEOUS

## 2016-04-25 MED ORDER — SODIUM CHLORIDE 0.9 % IV SOLN
0.0000 ug/min | INTRAVENOUS | Status: DC
  Administered 2016-04-25: 50 ug/min via INTRAVENOUS
  Administered 2016-04-26: 90 ug/min via INTRAVENOUS
  Administered 2016-04-26: 105 ug/min via INTRAVENOUS
  Administered 2016-04-26: 110 ug/min via INTRAVENOUS
  Administered 2016-04-26: 105 ug/min via INTRAVENOUS
  Administered 2016-04-26: 115 ug/min via INTRAVENOUS
  Administered 2016-04-27: 20 ug/min via INTRAVENOUS
  Filled 2016-04-25 (×9): qty 1

## 2016-04-25 MED ORDER — VANCOMYCIN HCL IN DEXTROSE 1-5 GM/200ML-% IV SOLN: 1000.0000 mg | Freq: Once | INTRAVENOUS | Status: DC

## 2016-04-25 MED ORDER — PIPERACILLIN-TAZOBACTAM 3.375 G IVPB 30 MIN
3.3750 g | Freq: Once | INTRAVENOUS | Status: AC
  Administered 2016-04-25: 3.375 g via INTRAVENOUS
  Filled 2016-04-25: qty 50

## 2016-04-25 MED ORDER — LEVALBUTEROL HCL 0.63 MG/3ML IN NEBU
0.6300 mg | INHALATION_SOLUTION | RESPIRATORY_TRACT | Status: DC | PRN
  Administered 2016-04-26: 0.63 mg via RESPIRATORY_TRACT
  Filled 2016-04-25 (×2): qty 3

## 2016-04-25 MED ORDER — IPRATROPIUM-ALBUTEROL 0.5-2.5 (3) MG/3ML IN SOLN
3.0000 mL | Freq: Four times a day (QID) | RESPIRATORY_TRACT | Status: DC
  Administered 2016-04-25 – 2016-04-26 (×3): 3 mL via RESPIRATORY_TRACT
  Filled 2016-04-25 (×4): qty 3

## 2016-04-25 MED ORDER — HYDRALAZINE HCL 20 MG/ML IJ SOLN: 10.0000 mg | Freq: Three times a day (TID) | INTRAMUSCULAR | Status: DC | PRN

## 2016-04-25 MED ORDER — VANCOMYCIN HCL IN DEXTROSE 1-5 GM/200ML-% IV SOLN
1000.0000 mg | INTRAVENOUS | Status: DC
  Administered 2016-04-26 – 2016-04-27 (×2): 1000 mg via INTRAVENOUS
  Filled 2016-04-25 (×3): qty 200

## 2016-04-25 NOTE — ED Provider Notes (Signed)
[facility] DEPT Provider Note   CSN: [id number] Arrival date & time: 04/25/16  1118     History   Chief Complaint Chief Complaint  Patient presents with  . Shortness of Breath   Level V caveat acuity of situation. She is obtained from patient's son and husband who accompany her. Patient brought by EMS HPI AVEA MCGOWEN is a 83 y.o. female. Patient with "upper respiratory infection" since Christmas 2017. This morning she was talking in nonsensical terms. She's been coughing for several weeks. No vomiting. Patient denies pain anywhere. Treated by EMS with supplemental oxygen. She typically wears oxygen 2 L at home at night. Also wears CPAP No vomiting. No other associated symptoms.  HPI  Past Medical History:  Diagnosis Date  . Acute blood loss anemia 04/20/2012  . AF (atrial fibrillation) ([place])     AV ablation 9/09 [facility] per Dr Ola Spurr - AV node ablation 9/11 Dr Caryl Comes  . Amiodarone pulmonary toxicity   . Asthma   . Bronchitis    hx of   . [name] (coronary artery disease)    (not sure of this 11/10  . CHF (congestive heart failure) ([place])   . Chronic renal insufficiency, stage III (moderate)    CrCl about 60 ml/min  . Complication of anesthesia    "psycotic episode" after hip surg - resolved  . COPD (chronic obstructive pulmonary disease) (HCC)    emphysema -FeV1 73% DLCO 53% 5/09  . CVA (cerebral vascular accident) ([place])    no residual effects evident to pt   . Depression 06/06/2012  . Diabetes ([place])   . Diastolic heart failure    Acute on Chronic  . Eczema   . GERD (gastroesophageal reflux disease)   . History of skin cancer   . Hx of cardiovascular stress test    Lexiscan Myoview (09/2013):  No definite ischemia, EF 67%; low risk  . Hyperlipidemia   . Invasive ductal carcinoma of breast ([place]) 2011   LEFT   . Mental disorder   . OA (osteoarthritis) of knee    RIGHT  . Pacemaker    Permanent  . PAD (peripheral artery disease) (HCC)    w/hx right iliac/SFA  stenting and left and rt leg PTA  . Pain    pt states has pain in fingertips per right hand pt states has been told may be carpal tunnel  . Pneumonia    hx of   . Right sided sciatica   . Shortness of breath dyspnea    walking distances   . Sinoatrial node dysfunction (HCC)   . Sleep apnea    associated with hypersomnia uses O2 2L/M at night and during naps also uses CPAP  . Tobacco abuse   . Tremors of nervous system    hands bilat     Patient Active Problem List   Diagnosis Date Noted  . Rotator cuff tear arthropathy of both shoulders 12/09/2015  . Pain in joint, upper arm 11/22/2015  . Tremors of nervous system 05/21/2015  . Complete heart block ([place]) 02/13/2015  . Acute blood loss anemia 01/28/2015  . Constipation 11/07/2014  . OA (osteoarthritis) of hip 10/31/2014  . Chest pain, atypical 06/07/2014  . Osteoporosis 01/10/2014  . Nicotine dependence in remission 06/28/2013  . GERD (gastroesophageal reflux disease) 07/19/2012  . Depression 06/06/2012  . Secondary hyperparathyroidism ([place]) 04/25/2012  . Decreased bone density 04/20/2012  . Gold stage C. COPD with frequent exacerbations 12/15/2011  . Diastolic CHF, chronic ([place]) 12/15/2011  .  Routine health maintenance 08/27/2011  . History of stroke 02/21/2010  . PACEMAKER, PERMANENT 02/21/2010  . Breast cancer of upper-inner quadrant of left female breast ([place]) 02/03/2010  . Allergic rhinitis 12/31/2009  . Carotid stenosis 08/17/2008  . Controlled diabetes mellitus type 2 with complications ([place]) 00/76/2263  . Obstructive sleep apnea 06/01/2008  . Atrial fibrillation ([place]) 06/01/2008  . [name] 06/01/2008  . Hyperlipidemia 12/30/2006  . Coronary atherosclerosis 12/30/2006    Past Surgical History:  Procedure Laterality Date  . ATRIAL ABLATION SURGERY     x 2 - "did not help - had to have pacemaker"  . BREAST LUMPECTOMY     left breast  . CARDIAC CATHETERIZATION    . CATARACT EXTRACTION, BILATERAL     with IOL/Dr  Katy Fitch  . EP IMPLANTABLE DEVICE N/A 02/13/2015   Procedure: PPM Generator Changeout-St. Jude device;  Surgeon: Deboraha Sprang, MD;  Location: [facility];  Service: Cardiovascular;  Laterality: N/A;  . MASTECTOMY, PARTIAL  02/03/2010   Left/Dr Rosenbower  . ORIF ACETABULAR FRACTURE Left 04/19/2012   Procedure: OPEN REDUCTION INTERNAL FIXATION (ORIF) ACETABULAR FRACTURE;  Surgeon: Rozanna Box, MD;  Location: [facility];  Service: Orthopedics;  Laterality: Left;  . sun spot removed      forhead  . TOTAL HIP ARTHROPLASTY Left 10/31/2014   Procedure: LEFT TOTAL HIP ARTHROPLASTY POSTERIOR  APPROACH;  Surgeon: Gaynelle Arabian, MD;  Location: [facility];  Service: Orthopedics;  Laterality: Left;  . TOTAL KNEE ARTHROPLASTY Right 10/16/2013   Procedure: RIGHT TOTAL KNEE ARTHROPLASTY;  Surgeon: Gearlean Alf, MD;  Location: [facility];  Service: Orthopedics;  Laterality: Right;  Marland Kitchen VASCULAR SURGERY     both legs     OB History    No data available       Home Medications    Prior to Admission medications   Medication Sig Start Date End Date Taking? Authorizing Provider  acetaminophen (TYLENOL) 650 MG CR tablet Take 1,300 mg by mouth every 8 (eight) hours as needed for pain.    Historical Provider, MD  AMITIZA 24 MCG capsule TAKE 1 CAPSULE TWICE DAILY WITH A MEAL 03/10/16   Hoyt Koch, MD  atenolol (TENORMIN) 25 MG tablet Take 1 tablet (25 mg total) by mouth 2 (two) times daily. 03/20/16 06/18/16  Deboraha Sprang, MD  atorvastatin (LIPITOR) 20 MG tablet TAKE 1 TABLET EVERY DAY 03/10/16   Deboraha Sprang, MD  B-D ULTRA-FINE 33 LANCETS MISC Use to help check blood sugars daily Dx E11.9 07/30/15   Hoyt Koch, MD  Biotin (BIOTIN 5000) 5 MG CAPS Take 5 mg by mouth at bedtime.     Historical Provider, MD  ELIQUIS 5 MG TABS tablet TAKE 1 TABLET TWICE DAILY  (MUST  ESTABLISH  NEW  PRIMARY CARE PROVIDER  FOR FUTURE REFILLS) 11/14/15   Hoyt Koch, MD  Fluticasone-Salmeterol (ADVAIR) 250-50  MCG/DOSE AEPB Inhale 1 puff into the lungs 2 (two) times daily. 07/15/15   Praveen Mannam, MD  gabapentin (NEURONTIN) 100 MG capsule Take 1 capsule (100 mg total) by mouth at bedtime. 12/09/15   Lyndal Pulley, DO  glucose blood (BAYER CONTOUR TEST) test strip 1 each by Other route daily. Use to check blood sugars every day Dx E11.9 07/18/15   Hoyt Koch, MD  HYDROcodone-homatropine [facility]) 5-1.5 MG/5ML syrup Take 5 mLs by mouth every 8 (eight) hours as needed for cough. Patient not taking: Reported on 04/20/2016 03/12/16   Real Cons  Sharlet Salina, MD  ketoconazole (NIZORAL) 2 % cream Apply 1 application topically daily. 01/28/15   Hoyt Koch, MD  levalbuterol [facility] HFA) 45 MCG/ACT inhaler Inhale 2 puffs into the lungs every 6 (six) hours as needed. Reported on 09/23/2015    Historical Provider, MD  levalbuterol Penne Lash) 0.63 MG/3ML nebulizer solution Take 3 mLs (0.63 mg total) by nebulization every 6 (six) hours as needed for wheezing or shortness of breath. 07/15/15   Praveen Mannam, MD  meloxicam (MOBIC) 7.5 MG tablet Take 1 tablet (7.5 mg total) by mouth 2 (two) times daily. 08/23/15   Gardiner Barefoot, DPM  metFORMIN (GLUCOPHAGE) 500 MG tablet TAKE 1 TABLET TWICE DAILY WITH A MEAL 08/08/15   Hoyt Koch, MD  montelukast (SINGULAIR) 10 MG tablet Take 1 tablet (10 mg total) by mouth at bedtime. 04/20/16   Hoyt Koch, MD  Multiple Vitamins-Minerals (MULTIVITAMIN WITH MINERALS) tablet Take 1 tablet by mouth 2 (two) times daily.     Historical Provider, MD  potassium chloride SA (K-DUR,KLOR-CON) 20 MEQ tablet Take 40 mEq by mouth 2 (two) times daily.    Historical Provider, MD  tiotropium (SPIRIVA HANDIHALER) 18 MCG inhalation capsule INHALE THE CONTENTS OF 1 CAPSULE EVERY DAY 07/15/15   Marshell Garfinkel, MD  torsemide (DEMADEX) 10 MG tablet TAKE 1 TABLET EVERY DAY 01/06/16   Hoyt Koch, MD    Family History Family History  Problem Relation Age of Onset  . Heart  disease Father     Social History Social History  Substance Use Topics  . Smoking status: Former Smoker    Packs/day: 1.00    Years: 55.00    Types: Cigarettes    Quit date: 12/19/2011  . Smokeless tobacco: Never Used     Comment: started smoking at age 33--4 cigs per day  . Alcohol use Yes     Comment: wine (rare)     Allergies   Cholestatin and Sulfur   Review of Systems Review of Systems  Unable to perform ROS: Acuity of condition  Respiratory: Positive for cough.   Allergic/Immunologic: Positive for immunocompromised state.     Physical Exam Updated Vital Signs BP (!) 103/45 (BP Location: Right Arm)   Temp (!) 104 F (40 C) (Rectal)   Resp 22   Wt 171 lb (77.6 kg)   SpO2 92%   BMI 28.46 kg/m   Physical Exam  Constitutional:  Chronically and acutely ill-appearing  HENT:  Head: Normocephalic and atraumatic.  Mucous membranes dry  Eyes: Conjunctivae are normal. Pupils are equal, round, and reactive to light.  Neck: Neck supple. No tracheal deviation present. No thyromegaly present.  Cardiovascular: Normal rate.   No murmur heard. Irregularly irregular  Pulmonary/Chest: Effort normal.  Rales posteriorly right side  Abdominal: Soft. Bowel sounds are normal. She exhibits no distension. There is no tenderness.  Musculoskeletal: Normal range of motion. She exhibits no edema or tenderness.  Neurological: She is alert. Coordination normal.  Skin: Skin is warm and dry. No rash noted.  Psychiatric: She has a normal mood and affect.  Nursing note and vitals reviewed.    ED Treatments / Results  Labs (all labs ordered are listed, but only abnormal results are displayed) Labs Reviewed  CULTURE, BLOOD (ROUTINE X 2)  CULTURE, BLOOD (ROUTINE X 2)  URINE CULTURE  COMPREHENSIVE METABOLIC PANEL  CBC WITH DIFFERENTIAL/PLATELET  URINALYSIS, ROUTINE W REFLEX MICROSCOPIC  I-STAT CG4 LACTIC ACID, ED    EKG  EKG Interpretation  Date/Time:  Saturday  April 25 2016 11:20:04 EST Ventricular Rate:  70 PR Interval:    QRS Duration: 142 QT Interval:  462 QTC Calculation: 499 R Axis:   -75 Text Interpretation:  Accelerated junctional rhythm Nonspecific IVCD with LAD LVH with secondary repolarization abnormality Probable inferior infarct, recent Anterolateral infarct, age indeterminate Baseline wander in lead(s) V2 No significant change since last tracing Confirmed by Winfred Leeds  MD, Clara Smolen [phone]) on 04/25/2016 11:33:03 AM       Radiology No results found.  Procedures Procedures (including critical care time)  Medications Ordered in ED Medications  piperacillin-tazobactam (ZOSYN) IVPB 3.375 g (not administered)  vancomycin (VANCOCIN) IVPB 1000 mg/200 mL premix (not administered)  acetaminophen (TYLENOL) tablet 650 mg (not administered)  acetaminophen (TYLENOL) tablet 650 mg (650 mg Oral Given 04/25/16 1139)     Initial Impression / Assessment and Plan / ED Course  I have reviewed the triage vital signs and the nursing notes.  Pertinent labs & imaging results that were available during my care of the patient were reviewed by me and considered in my medical decision making (see chart for details).     Code sepsis called based on Sirs criteria of fever, tachypnea. Source unclear however possible sources respiratory Ministered IV fluid bolus 30 mL/kg due to 2 separate MAPs <65 Sepsis - Repeat Assessment  Performed at:      Vitals     Blood pressure 119/60, pulse 72, temperature (!) 104 F (40 C), temperature source Rectal, resp. rate (!) 29, weight 171 lb (77.6 kg), SpO2 (!) 88 %.  Heart:     Irregular, normal rate  Lungs:    Rales right base posteriorly my exam unchanged  Capillary Refill:   <2 sec  Peripheral Pulse:   Radial pulse palpable  Skin:     Normal Color  Chest x-ray viewed by me Results for orders placed or performed during the hospital encounter of 04/25/16  Blood Culture (routine x 2)  Result Value Ref Range    Specimen Description BLOOD RIGHT ANTECUBITAL    Special Requests BOTTLES DRAWN AEROBIC AND ANAEROBIC  5CC    Culture PENDING    Report Status PENDING   Comprehensive metabolic panel  Result Value Ref Range   Sodium 141 135 - 145 mmol/L   Potassium 4.4 3.5 - 5.1 mmol/L   Chloride 106 101 - 111 mmol/L   CO2 22 22 - 32 mmol/L   Glucose, Bld 210 (H) 65 - 99 mg/dL   BUN 13 6 - 20 mg/dL   Creatinine, Ser 1.19 (H) 0.44 - 1.00 mg/dL   Calcium 9.4 8.9 - 10.3 mg/dL   Total Protein 7.1 6.5 - 8.1 g/dL   Albumin 3.4 (L) 3.5 - 5.0 g/dL   AST 26 15 - 41 U/L   ALT 12 (L) 14 - 54 U/L   Alkaline Phosphatase 67 38 - 126 U/L   Total Bilirubin 1.3 (H) 0.3 - 1.2 mg/dL   GFR calc non Af Amer 41 (L) >60 mL/min   GFR calc Af Amer 48 (L) >60 mL/min   Anion gap 13 5 - 15  CBC WITH DIFFERENTIAL  Result Value Ref Range   WBC 16.4 (H) 4.0 - 10.5 K/uL   RBC 4.64 3.87 - 5.11 MIL/uL   Hemoglobin 13.4 12.0 - 15.0 g/dL   HCT 41.2 36.0 - 46.0 %   MCV 88.8 78.0 - 100.0 fL   MCH 28.9 26.0 - 34.0 pg   MCHC 32.5 30.0 - 36.0 g/dL  RDW 15.5 11.5 - 15.5 %   Platelets 209 150 - 400 K/uL   Neutrophils Relative % 89 %   Neutro Abs 14.6 (H) 1.7 - 7.7 K/uL   Lymphocytes Relative 5 %   Lymphs Abs 0.8 0.7 - 4.0 K/uL   Monocytes Relative 6 %   Monocytes Absolute 0.9 0.1 - 1.0 K/uL   Eosinophils Relative 0 %   Eosinophils Absolute 0.0 0.0 - 0.7 K/uL   Basophils Relative 0 %   Basophils Absolute 0.0 0.0 - 0.1 K/uL  Urinalysis, Routine w reflex microscopic  Result Value Ref Range   Color, Urine YELLOW YELLOW   APPearance CLEAR CLEAR   Specific Gravity, Urine 1.019 1.005 - 1.030   pH 5.0 5.0 - 8.0   Glucose, UA NEGATIVE NEGATIVE mg/dL   Hgb urine dipstick NEGATIVE NEGATIVE   Bilirubin Urine NEGATIVE NEGATIVE   Ketones, ur NEGATIVE NEGATIVE mg/dL   Protein, ur NEGATIVE NEGATIVE mg/dL   Nitrite NEGATIVE NEGATIVE   Leukocytes, UA NEGATIVE NEGATIVE  Influenza panel by PCR (type A & B)  Result Value Ref Range    Influenza A By PCR NEGATIVE NEGATIVE   Influenza B By PCR NEGATIVE NEGATIVE  I-Stat CG4 Lactic Acid, ED  (not at  [facility])  Result Value Ref Range   Lactic Acid, Venous 2.19 (HH) 0.5 - 1.9 mmol/L   Comment NOTIFIED PHYSICIAN    *Note: Due to a large number of results and/or encounters for the requested time period, some results have not been displayed. A complete set of results can be found in Results Review.   Dg Chest 2 View  Result Date: 04/25/2016 CLINICAL DATA:  Shortness of Breath EXAM: CHEST  2 VIEW COMPARISON:  October 24, 2014 FINDINGS: There is airspace consolidation in the posterior left base. The lungs elsewhere are clear. Heart size and pulmonary vascularity are normal. No adenopathy. Pacemaker leads are attached to the right atrium and middle cardiac vein. No pneumothorax. There is aortic atherosclerosis. Mean no bone lesions evident. IMPRESSION: Airspace consolidation consistent with pneumonia posterior aspect left lower lobe. Lungs elsewhere clear. Aortic atherosclerosis present. Followup PA and lateral chest radiographs recommended in 3-4 weeks following trial of antibiotic therapy to ensure resolution and exclude underlying malignancy. Electronically Signed   By: Lowella Grip III M.D.   On: 04/25/2016 12:43  Chest x-ray viewed by me. Dr. Aggie Moats consulted and will see patient in hospital. Final Clinical Impressions(s) / ED Diagnoses  Diagnoses #1 severe sepsis #2 community acquired pneumonia #3 hyperglycemia CRITICAL CARE Performed by: Orlie Dakin Total critical care time: 35 minutes Critical care time was exclusive of separately billable procedures and treating other patients. Critical care was necessary to treat or prevent imminent or life-threatening deterioration. Critical care was time spent personally by me on the following activities: development of treatment plan with patient and/or surrogate as well as nursing, discussions with consultants, evaluation of patient's  response to treatment, examination of patient, obtaining history from patient or surrogate, ordering and performing treatments and interventions, ordering and review of laboratory studies, ordering and review of radiographic studies, pulse oximetry and re-evaluation of patient's condition. Final diagnoses:  None    New Prescriptions New Prescriptions   No medications on file     Orlie Dakin, MD 04/25/16 1425

## 2016-04-25 NOTE — ED Triage Notes (Signed)
Sick since Tuesday with respiratory symptoms.  O2 sat on scene <86.  Had one neb tx en route, sat up to 92 on 2L [place]

## 2016-04-25 NOTE — H&P (Addendum)
[facility] History and Physical  HESSIE VARONE WUJ:[id number] DOB: [birth year] DOA: 04/25/2016  Referring physician:  PCP: Hoyt Koch, MD   Chief Complaint: "She was talking nonsensical."-Son  HPI: [name] is a 83 y.o. female  with past medical history significant for atrial fibrillation, asthma, coronary disease, heart failure, CK D, COPD, depression, diabetes, reflux, pacemaker placement presents emergency room with chief complaint of shortness of breath. Per family patient has been with a respiratory infection and cough since December 2017. This is not improved. Patient went and saw her primary care doctor recently was prescribed Singulair. This had no effect. Patient continued to worsen at home. She spoke on the phone to her son on the day of admission and reportedly was making no sense at all. Her son activated EMS. Patient was found to be hypoxic and her oxygen was titrated up from 2 L which she is on chronically to 4 liters. And patient responded. Patient has not been on any antibiotics recently. Patient was transferred to the emergency room.  Denies fever, chills, nausea, vomiting, rash. Positive for weakness in legs, cough, shortness of breath and confusion.  ED course: Chest x-ray was done that showed patient had a pneumonia. Vital signs were difficult to get due to patient's pain syndrome, lead to difficulties with blood pressure cuff. Patient got antibiotics in the emergency room. Hospitalist was consulted for admission.  Review of Systems:  As per HPI otherwise 10 point review of systems negative.    Past Medical History:  Diagnosis Date  . Acute blood loss anemia 04/20/2012  . AF (atrial fibrillation) ([place])     AV ablation 9/09 [place] per Dr Ola Spurr - AV node ablation 9/11 Dr Caryl Comes  . Amiodarone pulmonary toxicity   . Asthma   . Bronchitis    hx of   . [name] (coronary artery disease)    (not sure of this 11/10  . CHF (congestive heart failure)  ([place])   . Chronic renal insufficiency, stage III (moderate)    CrCl about 60 ml/min  . CKD (chronic kidney disease) stage 3, GFR 30-59 ml/min   . Complication of anesthesia    "psycotic episode" after hip surg - resolved  . COPD (chronic obstructive pulmonary disease) (HCC)    emphysema -FeV1 73% DLCO 53% 5/09  . CVA (cerebral vascular accident) ([place])    no residual effects evident to pt   . Depression 06/06/2012  . Diabetes ([place])   . Diastolic heart failure    Acute on Chronic  . Eczema   . GERD (gastroesophageal reflux disease)   . History of skin cancer   . Hx of cardiovascular stress test    Lexiscan Myoview (09/2013):  No definite ischemia, EF 67%; low risk  . Hyperlipidemia   . Invasive ductal carcinoma of breast ([place]) 2011   LEFT   . Mental disorder   . OA (osteoarthritis) of knee    RIGHT  . Pacemaker    Permanent  . PAD (peripheral artery disease) (HCC)    w/hx right iliac/SFA stenting and left and rt leg PTA  . Pain    pt states has pain in fingertips per right hand pt states has been told may be carpal tunnel  . Pneumonia    hx of   . Right sided sciatica   . Shortness of breath dyspnea    walking distances   . Sinoatrial node dysfunction (HCC)   . Sleep apnea    associated with hypersomnia uses  O2 2L/M at night and during naps also uses CPAP  . Tobacco abuse   . Tremors of nervous system    hands bilat    Past Surgical History:  Procedure Laterality Date  . ATRIAL ABLATION SURGERY     x 2 - "did not help - had to have pacemaker"  . BREAST LUMPECTOMY     left breast  . CARDIAC CATHETERIZATION    . CATARACT EXTRACTION, BILATERAL     with IOL/Dr Katy Fitch  . EP IMPLANTABLE DEVICE N/A 02/13/2015   Procedure: PPM Generator Changeout-St. Jude device;  Surgeon: Deboraha Sprang, MD;  Location: [facility];  Service: Cardiovascular;  Laterality: N/A;  . MASTECTOMY, PARTIAL  02/03/2010   Left/Dr Rosenbower  . ORIF ACETABULAR FRACTURE Left 04/19/2012    Procedure: OPEN REDUCTION INTERNAL FIXATION (ORIF) ACETABULAR FRACTURE;  Surgeon: Rozanna Box, MD;  Location: [facility];  Service: Orthopedics;  Laterality: Left;  . sun spot removed      forhead  . TOTAL HIP ARTHROPLASTY Left 10/31/2014   Procedure: LEFT TOTAL HIP ARTHROPLASTY POSTERIOR  APPROACH;  Surgeon: Gaynelle Arabian, MD;  Location: [facility];  Service: Orthopedics;  Laterality: Left;  . TOTAL KNEE ARTHROPLASTY Right 10/16/2013   Procedure: RIGHT TOTAL KNEE ARTHROPLASTY;  Surgeon: Gearlean Alf, MD;  Location: [facility];  Service: Orthopedics;  Laterality: Right;  Marland Kitchen VASCULAR SURGERY     both legs    Social History:  reports that she quit smoking about 4 years ago. Her smoking use included Cigarettes. She has a 55.00 pack-year smoking history. She has never used smokeless tobacco. She reports that she drinks alcohol. She reports that she does not use drugs.  Allergies  Allergen Reactions  . Cholestatin Other (See Comments)    RAGWEED SEASON...sneezing   . Sulfur Itching    Family History  Problem Relation Age of Onset  . Heart disease Father      Prior to Admission medications   Medication Sig Start Date End Date Taking? Authorizing Provider  acetaminophen (TYLENOL) 650 MG CR tablet Take 1,300 mg by mouth every 8 (eight) hours as needed for pain.   Yes Historical Provider, MD  AMITIZA 24 MCG capsule TAKE 1 CAPSULE TWICE DAILY WITH A MEAL 03/10/16  Yes Hoyt Koch, MD  atenolol (TENORMIN) 25 MG tablet Take 1 tablet (25 mg total) by mouth 2 (two) times daily. 03/20/16 06/18/16 Yes Deboraha Sprang, MD  atorvastatin (LIPITOR) 20 MG tablet TAKE 1 TABLET EVERY DAY 03/10/16  Yes Deboraha Sprang, MD  B-D ULTRA-FINE 33 LANCETS MISC Use to help check blood sugars daily Dx E11.9 07/30/15  Yes Hoyt Koch, MD  Biotin (BIOTIN 5000) 5 MG CAPS Take 5 mg by mouth at bedtime.    Yes Historical Provider, MD  ELIQUIS 5 MG TABS tablet TAKE 1 TABLET TWICE DAILY  (MUST  ESTABLISH  NEW  PRIMARY  CARE PROVIDER  FOR FUTURE REFILLS) 11/14/15  Yes Hoyt Koch, MD  Fluticasone-Salmeterol (ADVAIR) 250-50 MCG/DOSE AEPB Inhale 1 puff into the lungs 2 (two) times daily. 07/15/15  Yes Praveen Mannam, MD  glucose blood (BAYER CONTOUR TEST) test strip 1 each by Other route daily. Use to check blood sugars every day Dx E11.9 07/18/15  Yes Hoyt Koch, MD  HYDROcodone-homatropine [facility]) 5-1.5 MG/5ML syrup Take 5 mLs by mouth every 8 (eight) hours as needed for cough. 03/12/16  Yes Hoyt Koch, MD  levalbuterol [facility]) 45 MCG/ACT inhaler Inhale 2  puffs into the lungs every 6 (six) hours as needed. Reported on 09/23/2015   Yes Historical Provider, MD  levalbuterol (XOPENEX) 0.63 MG/3ML nebulizer solution Take 3 mLs (0.63 mg total) by nebulization every 6 (six) hours as needed for wheezing or shortness of breath. 07/15/15  Yes Praveen Mannam, MD  metFORMIN (GLUCOPHAGE) 500 MG tablet TAKE 1 TABLET TWICE DAILY WITH A MEAL 08/08/15  Yes Hoyt Koch, MD  montelukast (SINGULAIR) 10 MG tablet Take 1 tablet (10 mg total) by mouth at bedtime. 04/20/16  Yes Hoyt Koch, MD  Multiple Vitamins-Minerals (MULTIVITAMIN WITH MINERALS) tablet Take 1 tablet by mouth 2 (two) times daily.    Yes Historical Provider, MD  potassium chloride SA (K-DUR,KLOR-CON) 20 MEQ tablet Take 40-60 mEq by mouth See admin instructions. 3mq in am, 449m in pm   Yes Historical Provider, MD  tiotropium (SPIRIVA HANDIHALER) 18 MCG inhalation capsule INHALE THE CONTENTS OF 1 CAPSULE EVERY DAY 07/15/15  Yes PrMarshell GarfinkelMD  torsemide (DEMADEX) 10 MG tablet TAKE 1 TABLET EVERY DAY 01/06/16  Yes ElHoyt KochMD   Physical Exam: Vitals:   04/25/16 1500 04/25/16 1518 04/25/16 1543 04/25/16 1545  BP: (!) '90/24 97/72 97/72 '$ 97/85  Pulse: 77 73  76  Resp: 24 (!) 34 (!) 34 23  Temp:   102.7 F (39.3 C)   TempSrc:      SpO2: 100% 96% (!) 88% 92%  Weight:        Wt Readings from Last 3 Encounters:   04/25/16 77.6 kg (171 lb)  04/20/16 78 kg (172 lb)  03/16/16 79.1 kg (174 lb 6.4 oz)    General:  Appears calm and comfortable, A&Ox3 Eyes:  PERRL, EOMI, normal lids, iris ENT:  grossly normal hearing, lips & tongue, dry mouth Neck:  no LAD, masses or thyromegaly Cardiovascular:  RRR, no m/r/g. No LE edema.  Respiratory:  Tachypnea, use of accessory muscles, decreased air movement, scattered rales inferiorly on right/left. Abdomen:  soft, ntnd Skin:  no rash or induration seen on limited exam Musculoskeletal:  grossly normal tone BUE/BLE Psychiatric:  grossly normal mood and affect, speech fluent and appropriate Neurologic:  CN 2-12 grossly intact, moves all extremities in coordinated fashion.          Labs on Admission:  Basic Metabolic Panel:  Recent Labs Lab 04/25/16 1140  NA 141  K 4.4  CL 106  CO2 22  GLUCOSE 210*  BUN 13  CREATININE 1.19*  CALCIUM 9.4   Liver Function Tests:  Recent Labs Lab 04/25/16 1140  AST 26  ALT 12*  ALKPHOS 67  BILITOT 1.3*  PROT 7.1  ALBUMIN 3.4*   No results for input(s): LIPASE, AMYLASE in the last 168 hours. No results for input(s): AMMONIA in the last 168 hours. CBC:  Recent Labs Lab 04/25/16 1140  WBC 16.4*  NEUTROABS 14.6*  HGB 13.4  HCT 41.2  MCV 88.8  PLT 209   Cardiac Enzymes: No results for input(s): CKTOTAL, CKMB, CKMBINDEX, TROPONINI in the last 168 hours.  BNP (last 3 results) No results for input(s): BNP in the last 8760 hours.  ProBNP (last 3 results) No results for input(s): PROBNP in the last 8760 hours.   Serum creatinine: 1.19 mg/dL High 04/25/16 1140 Estimated creatinine clearance: 37.5 mL/min  CBG: No results for input(s): GLUCAP in the last 168 hours.  Radiological Exams on Admission: Dg Chest 2 View  Result Date: 04/25/2016 CLINICAL DATA:  Shortness of Breath EXAM: CHEST  2 VIEW COMPARISON:  October 24, 2014 FINDINGS: There is airspace consolidation in the posterior left base. The  lungs elsewhere are clear. Heart size and pulmonary vascularity are normal. No adenopathy. Pacemaker leads are attached to the right atrium and middle cardiac vein. No pneumothorax. There is aortic atherosclerosis. Mean no bone lesions evident. IMPRESSION: Airspace consolidation consistent with pneumonia posterior aspect left lower lobe. Lungs elsewhere clear. Aortic atherosclerosis present. Followup PA and lateral chest radiographs recommended in 3-4 weeks following trial of antibiotic therapy to ensure resolution and exclude underlying malignancy. Electronically Signed   By: Lowella Grip III M.D.   On: 04/25/2016 12:43    EKG: Independently reviewed. Paced rhythm.  EKG in December 2017 for comparison and shows no acute changes.  Assessment/Plan Principal Problem:   Acute on chronic respiratory failure with hypoxia (HCC) Active Problems:   Controlled diabetes mellitus type 2 with complications (HCC)   Hyperlipidemia   Obstructive sleep apnea   Coronary atherosclerosis   Atrial fibrillation (HCC)   Diastolic CHF, chronic (HCC)   Depression   GERD (gastroesophageal reflux disease)   Community acquired pneumonia  Acute on chronic respiratory failure with hypoxia secondary to pneumonia/sepsis Scheduled DuoNeb's When necessary albuterol Antibiotic-vancomycin and Zosyn Oxygen therapy Continuous pulse oximetry His next twice a day Senna spirometer, flutter valve Respiratory therapy consult Patient given 2 L of fluid in the emergency room, will give continuous fluids @ rate of 120m/hr Sputum cult ordered Flu neg Blood cult x2 pending Ua neg RT consult  DM A1c ordered, last 11/2015 7.1 Hold metformin SSI  Hx of Heart failure and patient that is dehydrated Hold Demadex, can likely be resumed tomorrow Echo tomorrow, last 2015  Seasonal allergies  hold Singulair  Hypertension  continue Tenormin  Constipation Holding amitiza RUQ pain, stool? Checking KUB  Atrial  fibrillation  continue eliquis  Hyperlipidemia Continue statin  COPD  hold Advair and Spiriva  CKD 3 Monitor Cr daily Cr at baseline,  ~1.2   Code Status: FULL CODE  DVT Prophylaxis: eliquis Family Communication: husband at bedside Disposition Plan: Pending Improvement  Status: sdu inpt  PElwin Mocha MD Family Medicine [facility] [email or web address] Password [email or web address]

## 2016-04-25 NOTE — Progress Notes (Signed)
Patient ID: [name], female   DOB: 1931-08-12, 83 y.o.   MRN: [id number]                                                                  PROGRESS NOTE                                                                                                                                                                                                             Patient Demographics:    [name], is a 83 y.o. female, DOB - 06-01-1931, GGY:[id number]  Admit date - 04/25/2016   Admitting Physician Tiffany Mocha, MD  Outpatient Primary MD for the patient is Tiffany Koch, MD  LOS - 0  Outpatient Specialists     Chief Complaint  Patient presents with  . Shortness of Breath       Brief Narrative 83 y.o. female  with past medical history significant for atrial fibrillation, asthma, coronary disease, heart failure, CK D, COPD, depression, diabetes, reflux, pacemaker placement presents emergency room with chief complaint of shortness of breath. Per family patient has been with a respiratory infection and cough since December 2017. This is not improved. Patient went and saw her primary care doctor recently was prescribed Singulair. This had no effect. Patient continued to worsen at home. She spoke on the phone to her son on the day of admission and reportedly was making no sense at all. Her son activated EMS. Patient was found to be hypoxic and her oxygen was titrated up from 2 L which she is on chronically to 4 liters. And patient responded. Patient has not been on any antibiotics recently. Patient was transferred to the emergency room.  Denies fever, chills, nausea, vomiting, rash. Positive for weakness in legs, cough, shortness of breath and confusion.  ED course: Chest x-ray was done that showed patient had a pneumonia. Vital signs were difficult to get due to patient's pain syndrome, lead to difficulties with blood pressure cuff. Patient got antibiotics in the emergency room.  Hospitalist was consulted for admission.   Subjective:    [name]  Per RN hypotensive even after 3L of ns iv.  Pt denies cp, palp.  Pt is not complaining of dyspnea at this time.  appears anxious.  D/w her  son, [name] to use pressors.     Assessment  & Plan :    Principal Problem:   Acute on chronic respiratory failure with hypoxia (HCC) Active Problems:   Controlled diabetes mellitus type 2 with complications (HCC)   Hyperlipidemia   Obstructive sleep apnea   Coronary atherosclerosis   Atrial fibrillation (HCC)   Diastolic CHF, chronic (HCC)   Depression   GERD (gastroesophageal reflux disease)   Community acquired pneumonia   Hypotension secondary to ? sepsis Check trop I q6h x3 Check cortisol level Check cardiac echo  Ns bolus 1L iv x1 almost finished and pt still hypotensive Start neosynephrine peripherally  Iv til able to obtain central line Transfer to [facility], PCCM has accepted the patient.   Pneumonia/ sepsis Cont vanco Cont Zosyn Cont o2 2L  Blood culture pending  CHF (EF 55%) Holding demadex If bp improves can restart Foley catheter Strict I and O Echo pending  Pafib Cont eliquis, Hold atenolol due to hypotension  Hyperlipidemia Cont lipitor  Dm2 Metformin on hold,  Relatively contraindicated with hx of CHF Cont ssi  CKD stage 3 ? (baseline cr 1.2) Check cmp in am  Copd  Cont Duoneb  (in place of advair/spiriva as per admission physician)  Code Status :  DNI/ DNR, pressors OK  Family Communication  :  D/w son  Disposition Plan  : pending improvement  Barriers For Discharge :   Consults  :  Transfer to PCCM  Procedures  :  Central line pending   DVT Prophylaxis  :  On eliquis.   Lab Results  Component Value Date   PLT 209 04/25/2016    Antibiotics  :  Vanco=> 2/17=> Zosyn=> 2/17=>  Anti-infectives    Start     Dose/Rate Route Frequency Ordered Stop   04/26/16 1300  vancomycin (VANCOCIN) IVPB 1000 mg/200 mL premix     1,000  mg 200 mL/hr over 60 Minutes Intravenous Every 24 hours 04/25/16 1319     04/25/16 2000  piperacillin-tazobactam (ZOSYN) IVPB 3.375 g     3.375 g 12.5 mL/hr over 240 Minutes Intravenous Every 8 hours 04/25/16 1319     04/25/16 1230  vancomycin (VANCOCIN) 1,750 mg in sodium chloride 0.9 % 500 mL IVPB     1,750 mg 250 mL/hr over 120 Minutes Intravenous  Once 04/25/16 1214 04/25/16 1521   04/25/16 1200  piperacillin-tazobactam (ZOSYN) IVPB 3.375 g     3.375 g 100 mL/hr over 30 Minutes Intravenous  Once 04/25/16 1145 04/25/16 1254   04/25/16 1200  vancomycin (VANCOCIN) IVPB 1000 mg/200 mL premix  Status:  Discontinued     1,000 mg 200 mL/hr over 60 Minutes Intravenous  Once 04/25/16 1145 04/25/16 1214        Objective:   Vitals:   04/25/16 2108 04/25/16 2132 04/25/16 2148 04/25/16 2200  BP: (!) 76/45 (!) 69/39 (!) 58/44 (!) 74/54  Pulse: 72 75 70 74  Resp: (!) 26 (!) 25 (!) 25 (!) 26  Temp:    (!) 100.6 F (38.1 C)  TempSrc:    Rectal  SpO2: 94% 95% 95% 94%  Weight:      Height:        Wt Readings from Last 3 Encounters:  04/25/16 80.9 kg (178 lb 5.6 oz)  04/20/16 78 kg (172 lb)  03/16/16 79.1 kg (174 lb 6.4 oz)     Intake/Output Summary (Last 24 hours) at 04/25/16 2255 Last data filed at 04/25/16 1800  Gross per 24  hour  Intake             5040 ml  Output               40 ml  Net             5000 ml     Physical Exam  Awake Alert, No new F.N deficits, Normal affect [place].AT,PERRAL Supple Neck,No JVD, No cervical lymphadenopathy appriciated.  Symmetrical Chest wall movement, Good air movement bilaterally, slight rhonchi RRR,No Gallops,Rubs or new Murmurs, No Parasternal Heave +ve B.Sounds, Abd Soft, No tenderness, No organomegaly appriciated, No rebound - guarding or rigidity. No Cyanosis, Clubbing or edema, No new Rash or bruise      Data Review:    CBC  Recent Labs Lab 04/25/16 1140  WBC 16.4*  HGB 13.4  HCT 41.2  PLT 209  MCV 88.8  MCH 28.9  MCHC  32.5  RDW 15.5  LYMPHSABS 0.8  MONOABS 0.9  EOSABS 0.0  BASOSABS 0.0    Chemistries   Recent Labs Lab 04/25/16 1140  NA 141  K 4.4  CL 106  CO2 22  GLUCOSE 210*  BUN 13  CREATININE 1.19*  CALCIUM 9.4  AST 26  ALT 12*  ALKPHOS 67  BILITOT 1.3*   ------------------------------------------------------------------------------------------------------------------ No results for input(s): CHOL, HDL, LDLCALC, TRIG, CHOLHDL, LDLDIRECT in the last 72 hours.  Lab Results  Component Value Date   HGBA1C 7.1 (H) 11/22/2015   ------------------------------------------------------------------------------------------------------------------ No results for input(s): TSH, T4TOTAL, T3FREE, THYROIDAB in the last 72 hours.  Invalid input(s): FREET3 ------------------------------------------------------------------------------------------------------------------ No results for input(s): VITAMINB12, FOLATE, FERRITIN, TIBC, IRON, RETICCTPCT in the last 72 hours.  Coagulation profile No results for input(s): INR, PROTIME in the last 168 hours.  No results for input(s): DDIMER in the last 72 hours.  Cardiac Enzymes No results for input(s): CKMB, TROPONINI, MYOGLOBIN in the last 168 hours.  Invalid input(s): CK ------------------------------------------------------------------------------------------------------------------ No results found for: BNP  Inpatient Medications  Scheduled Meds: . sodium chloride   Intravenous Once  . apixaban  5 mg Oral BID  . atenolol  25 mg Oral BID  . [START ON 04/26/2016] atorvastatin  20 mg Oral q1800  . guaiFENesin  600 mg Oral BID  . insulin aspart  0-20 Units Subcutaneous TID WC  . ipratropium-albuterol  3 mL Nebulization Q6H  . piperacillin-tazobactam (ZOSYN)  IV  3.375 g Intravenous Q8H  . sodium chloride flush  3 mL Intravenous Q12H  . [START ON 04/26/2016] vancomycin  1,000 mg Intravenous Q24H   Continuous Infusions: . sodium chloride 100  mL/hr at 04/25/16 1557  . phenylephrine (NEO-SYNEPHRINE) Adult infusion     PRN Meds:.acetaminophen **OR** acetaminophen, hydrALAZINE, levalbuterol, ondansetron **OR** ondansetron (ZOFRAN) IV  Micro Results Recent Results (from the past 240 hour(s))  Blood Culture (routine x 2)     Status: None (Preliminary result)   Collection Time: 04/25/16 11:40 AM  Result Value Ref Range Status   Specimen Description BLOOD RIGHT ANTECUBITAL  Final   Special Requests BOTTLES DRAWN AEROBIC AND ANAEROBIC  5CC  Final   Culture PENDING  Incomplete   Report Status PENDING  Incomplete    Radiology Reports Dg Chest 2 View  Result Date: 04/25/2016 CLINICAL DATA:  Shortness of Breath EXAM: CHEST  2 VIEW COMPARISON:  October 24, 2014 FINDINGS: There is airspace consolidation in the posterior left base. The lungs elsewhere are clear. Heart size and pulmonary vascularity are normal. No adenopathy. Pacemaker leads are attached to the right  atrium and middle cardiac vein. No pneumothorax. There is aortic atherosclerosis. Mean no bone lesions evident. IMPRESSION: Airspace consolidation consistent with pneumonia posterior aspect left lower lobe. Lungs elsewhere clear. Aortic atherosclerosis present. Followup PA and lateral chest radiographs recommended in 3-4 weeks following trial of antibiotic therapy to ensure resolution and exclude underlying malignancy. Electronically Signed   By: Lowella Grip III M.D.   On: 04/25/2016 12:43   Dg Abd Portable 1 View  Result Date: 04/25/2016 CLINICAL DATA:  RIGHT upper quadrant pain for 5 days EXAM: PORTABLE ABDOMEN - 1 VIEW COMPARISON:  04/25/2012 FINDINGS: Single air-filled upper normal caliber loops of small bowel in the mid abdomen. Scattered gas and stool throughout colon. No definite bowel dilatation or bowel wall thickening. Numerous vascular stents throughout the RIGHT iliac system with atherosclerotic calcifications of the LEFT iliac system noted. Bones demineralized with  degenerative changes lumbar spine. Prior and LEFT acetabular reconstruction and LEFT hip replacement. IMPRESSION: Nonspecific bowel gas pattern. Electronically Signed   By: Lavonia Dana M.D.   On: 04/25/2016 16:47    Time Spent in minutes  30 critical care   Jani Gravel M.D on 04/25/2016 at 10:55 PM  Between 7am to 7pm - Pager - [phone]  After 7pm go to [email or web address] - password [facility] [place]  [facility] -  Office  [phone]

## 2016-04-25 NOTE — ED Notes (Signed)
Portable in room.  

## 2016-04-25 NOTE — Progress Notes (Signed)
Spoke with Dr. Maudie Mercury regarding low BP regarding low BP of 65/48 18mp 54). Ordered another 509mbolus. Will continue to monitor closely.

## 2016-04-25 NOTE — ED Notes (Signed)
Returned from xray

## 2016-04-25 NOTE — ED Notes (Signed)
Pt not tolerating BP cuff well.  Moves during obtaining BP, inaccurate last two readings.  Moved cuff to each extremity in effort to calm patient which did not work.  Finally able to obtain accurate pressure.  Admitting physician into room.  Pt has received 2092m NS.  Last 5018mNS turned down to 50 ml/hr per okay admitting physician.

## 2016-04-25 NOTE — Progress Notes (Signed)
Pharmacy Antibiotic Note  [name] is a 83 y.o. female admitted on 04/25/2016 with sepsis.  Pharmacy has been consulted for vancomycin and zosyn dosing. -WBC 16.4, febrile 104F -SCr 1.19 (this is baseline), CrCl 37.5  Plan: -Vancomycin 1750 mg x1, then '1000mg'$  Q24H -Zosyn 3.375g Q8H -Monitor renal function, cultures, clinical progress, VT at steady state  Weight: 171 lb (77.6 kg)  Temp (24hrs), Avg:104 F (40 C), Min:104 F (40 C), Max:104 F (40 C)   Recent Labs Lab 04/25/16 1140 04/25/16 1205  WBC 16.4*  --   CREATININE 1.19*  --   LATICACIDVEN  --  2.19*    Estimated Creatinine Clearance: 37.5 mL/min (by C-G formula based on SCr of 1.19 mg/dL (H)).    Allergies  Allergen Reactions  . Cholestatin Other (See Comments)    RAGWEED SEASON...sneezing   . Sulfur Itching    Antimicrobials this admission: Vanc 2/17 >> Zosyn 2/17 >>  Dose adjustments this admission: n/a  Microbiology results: 2/17 BCx: sent 2/17 UCx: sent  Thank you for allowing pharmacy to be a part of this patient's care.  Myer Peer Grayland Ormond), PharmD  [id number] Pharmacy Resident Pager: [phone] 04/25/2016 1:20 PM

## 2016-04-25 NOTE — ED Notes (Signed)
Tylenol '650mg'$  po double documented.  Only given once.

## 2016-04-25 NOTE — ED Notes (Signed)
Lactic acid result given to Dr. Winfred Leeds

## 2016-04-25 NOTE — Progress Notes (Signed)
Spoke with Dr. Jani Gravel regarding concerns for sepsis, specifically BP 79/44 (map 55), no urine output since 12 noon today when ED staff performed an In/Out cath to obtain urine specimen, lactic acid still 2.9, and temp still 103.3 rectally even though she had Tylenol at 1749. He ordered a 554m/hr NS bolus X 1. Will continue to monitor closely.

## 2016-04-25 NOTE — Progress Notes (Signed)
Notified Dr. Maudie Mercury about low BP 58/44 (map 50). Ordered 1 Liter bolus stat. Dr.Kim is going to consult CCMD regarding a central line and possible move to ICU and will call me back.

## 2016-04-25 NOTE — ED Notes (Signed)
Patient transported to X-ray 

## 2016-04-25 NOTE — ED Notes (Signed)
Tray arrived.  Pt set up to eat.  Husband remains at bedside.

## 2016-04-26 ENCOUNTER — Inpatient Hospital Stay (HOSPITAL_COMMUNITY): Payer: Medicare Other

## 2016-04-26 DIAGNOSIS — R278 Other lack of coordination: Secondary | ICD-10-CM | POA: Diagnosis not present

## 2016-04-26 DIAGNOSIS — E785 Hyperlipidemia, unspecified: Secondary | ICD-10-CM | POA: Diagnosis present

## 2016-04-26 DIAGNOSIS — J449 Chronic obstructive pulmonary disease, unspecified: Secondary | ICD-10-CM | POA: Diagnosis not present

## 2016-04-26 DIAGNOSIS — E1122 Type 2 diabetes mellitus with diabetic chronic kidney disease: Secondary | ICD-10-CM | POA: Diagnosis present

## 2016-04-26 DIAGNOSIS — R570 Cardiogenic shock: Secondary | ICD-10-CM | POA: Diagnosis present

## 2016-04-26 DIAGNOSIS — J189 Pneumonia, unspecified organism: Secondary | ICD-10-CM

## 2016-04-26 DIAGNOSIS — E119 Type 2 diabetes mellitus without complications: Secondary | ICD-10-CM | POA: Diagnosis not present

## 2016-04-26 DIAGNOSIS — E872 Acidosis: Secondary | ICD-10-CM | POA: Diagnosis present

## 2016-04-26 DIAGNOSIS — E1151 Type 2 diabetes mellitus with diabetic peripheral angiopathy without gangrene: Secondary | ICD-10-CM | POA: Diagnosis present

## 2016-04-26 DIAGNOSIS — Z5181 Encounter for therapeutic drug level monitoring: Secondary | ICD-10-CM | POA: Diagnosis not present

## 2016-04-26 DIAGNOSIS — J9690 Respiratory failure, unspecified, unspecified whether with hypoxia or hypercapnia: Secondary | ICD-10-CM | POA: Diagnosis not present

## 2016-04-26 DIAGNOSIS — I248 Other forms of acute ischemic heart disease: Secondary | ICD-10-CM | POA: Diagnosis present

## 2016-04-26 DIAGNOSIS — J9600 Acute respiratory failure, unspecified whether with hypoxia or hypercapnia: Secondary | ICD-10-CM | POA: Diagnosis not present

## 2016-04-26 DIAGNOSIS — N179 Acute kidney failure, unspecified: Secondary | ICD-10-CM | POA: Diagnosis present

## 2016-04-26 DIAGNOSIS — I130 Hypertensive heart and chronic kidney disease with heart failure and stage 1 through stage 4 chronic kidney disease, or unspecified chronic kidney disease: Secondary | ICD-10-CM | POA: Diagnosis present

## 2016-04-26 DIAGNOSIS — J439 Emphysema, unspecified: Secondary | ICD-10-CM | POA: Diagnosis present

## 2016-04-26 DIAGNOSIS — I481 Persistent atrial fibrillation: Secondary | ICD-10-CM

## 2016-04-26 DIAGNOSIS — J181 Lobar pneumonia, unspecified organism: Secondary | ICD-10-CM | POA: Diagnosis not present

## 2016-04-26 DIAGNOSIS — I5021 Acute systolic (congestive) heart failure: Secondary | ICD-10-CM | POA: Diagnosis not present

## 2016-04-26 DIAGNOSIS — A419 Sepsis, unspecified organism: Secondary | ICD-10-CM | POA: Diagnosis present

## 2016-04-26 DIAGNOSIS — G4733 Obstructive sleep apnea (adult) (pediatric): Secondary | ICD-10-CM

## 2016-04-26 DIAGNOSIS — E118 Type 2 diabetes mellitus with unspecified complications: Secondary | ICD-10-CM | POA: Diagnosis not present

## 2016-04-26 DIAGNOSIS — Z950 Presence of cardiac pacemaker: Secondary | ICD-10-CM | POA: Diagnosis not present

## 2016-04-26 DIAGNOSIS — R748 Abnormal levels of other serum enzymes: Secondary | ICD-10-CM | POA: Diagnosis not present

## 2016-04-26 DIAGNOSIS — I10 Essential (primary) hypertension: Secondary | ICD-10-CM | POA: Diagnosis not present

## 2016-04-26 DIAGNOSIS — R6521 Severe sepsis with septic shock: Secondary | ICD-10-CM

## 2016-04-26 DIAGNOSIS — B952 Enterococcus as the cause of diseases classified elsewhere: Secondary | ICD-10-CM | POA: Diagnosis present

## 2016-04-26 DIAGNOSIS — K219 Gastro-esophageal reflux disease without esophagitis: Secondary | ICD-10-CM | POA: Diagnosis present

## 2016-04-26 DIAGNOSIS — N390 Urinary tract infection, site not specified: Secondary | ICD-10-CM | POA: Diagnosis not present

## 2016-04-26 DIAGNOSIS — N183 Chronic kidney disease, stage 3 (moderate): Secondary | ICD-10-CM | POA: Diagnosis present

## 2016-04-26 DIAGNOSIS — J9621 Acute and chronic respiratory failure with hypoxia: Secondary | ICD-10-CM | POA: Diagnosis not present

## 2016-04-26 DIAGNOSIS — I480 Paroxysmal atrial fibrillation: Secondary | ICD-10-CM | POA: Diagnosis not present

## 2016-04-26 DIAGNOSIS — R6520 Severe sepsis without septic shock: Secondary | ICD-10-CM | POA: Diagnosis not present

## 2016-04-26 DIAGNOSIS — I4891 Unspecified atrial fibrillation: Secondary | ICD-10-CM | POA: Diagnosis not present

## 2016-04-26 DIAGNOSIS — I5043 Acute on chronic combined systolic (congestive) and diastolic (congestive) heart failure: Secondary | ICD-10-CM | POA: Diagnosis present

## 2016-04-26 DIAGNOSIS — I482 Chronic atrial fibrillation: Secondary | ICD-10-CM | POA: Diagnosis not present

## 2016-04-26 DIAGNOSIS — R2689 Other abnormalities of gait and mobility: Secondary | ICD-10-CM | POA: Diagnosis not present

## 2016-04-26 DIAGNOSIS — J9601 Acute respiratory failure with hypoxia: Secondary | ICD-10-CM | POA: Diagnosis not present

## 2016-04-26 DIAGNOSIS — J90 Pleural effusion, not elsewhere classified: Secondary | ICD-10-CM | POA: Diagnosis not present

## 2016-04-26 DIAGNOSIS — Y95 Nosocomial condition: Secondary | ICD-10-CM | POA: Diagnosis present

## 2016-04-26 DIAGNOSIS — F329 Major depressive disorder, single episode, unspecified: Secondary | ICD-10-CM | POA: Diagnosis present

## 2016-04-26 DIAGNOSIS — R0602 Shortness of breath: Secondary | ICD-10-CM | POA: Diagnosis not present

## 2016-04-26 DIAGNOSIS — I2510 Atherosclerotic heart disease of native coronary artery without angina pectoris: Secondary | ICD-10-CM | POA: Diagnosis present

## 2016-04-26 DIAGNOSIS — R1313 Dysphagia, pharyngeal phase: Secondary | ICD-10-CM | POA: Diagnosis not present

## 2016-04-26 LAB — BLOOD GAS, ARTERIAL
Acid-base deficit: 9.9 mmol/L — ABNORMAL HIGH (ref 0.0–2.0)
Bicarbonate: 15.1 mmol/L — ABNORMAL LOW (ref 20.0–28.0)
DELIVERY SYSTEMS: POSITIVE
Drawn by: 42624
Expiratory PAP: 8
FIO2: 60
Inspiratory PAP: 16
LHR: 12 {breaths}/min
Mode: POSITIVE
O2 Saturation: 95.1 %
PATIENT TEMPERATURE: 98.6
PCO2 ART: 30.7 mmHg — AB (ref 32.0–48.0)
PO2 ART: 86.9 mmHg (ref 83.0–108.0)
pH, Arterial: 7.312 — ABNORMAL LOW (ref 7.350–7.450)

## 2016-04-26 LAB — COMPREHENSIVE METABOLIC PANEL
ALBUMIN: 2.8 g/dL — AB (ref 3.5–5.0)
ALT: 26 U/L (ref 14–54)
AST: 56 U/L — AB (ref 15–41)
Alkaline Phosphatase: 60 U/L (ref 38–126)
Anion gap: 13 (ref 5–15)
BILIRUBIN TOTAL: 1.3 mg/dL — AB (ref 0.3–1.2)
BUN: 15 mg/dL (ref 6–20)
CHLORIDE: 113 mmol/L — AB (ref 101–111)
CO2: 15 mmol/L — ABNORMAL LOW (ref 22–32)
Calcium: 7.8 mg/dL — ABNORMAL LOW (ref 8.9–10.3)
Creatinine, Ser: 1.29 mg/dL — ABNORMAL HIGH (ref 0.44–1.00)
GFR calc Af Amer: 43 mL/min — ABNORMAL LOW (ref >60)
GFR calc non Af Amer: 37 mL/min — ABNORMAL LOW (ref >60)
GLUCOSE: 151 mg/dL — AB (ref 65–99)
POTASSIUM: 4.3 mmol/L (ref 3.5–5.1)
Sodium: 141 mmol/L (ref 135–145)
Total Protein: 6.4 g/dL — ABNORMAL LOW (ref 6.5–8.1)

## 2016-04-26 LAB — CBC
HCT: 42.4 % (ref 36.0–46.0)
Hemoglobin: 14.1 g/dL (ref 12.0–15.0)
MCH: 29.9 pg (ref 26.0–34.0)
MCHC: 33.3 g/dL (ref 30.0–36.0)
MCV: 90 fL (ref 78.0–100.0)
PLATELETS: 172 10*3/uL (ref 150–400)
RBC: 4.71 MIL/uL (ref 3.87–5.11)
RDW: 16.3 % — AB (ref 11.5–15.5)
WBC: 28.3 10*3/uL — AB (ref 4.0–10.5)

## 2016-04-26 LAB — GLUCOSE, CAPILLARY
Glucose-Capillary: 137 mg/dL — ABNORMAL HIGH (ref 65–99)
Glucose-Capillary: 161 mg/dL — ABNORMAL HIGH (ref 65–99)
Glucose-Capillary: 196 mg/dL — ABNORMAL HIGH (ref 65–99)
Glucose-Capillary: 145 mg/dL — ABNORMAL HIGH (ref 65–99)

## 2016-04-26 LAB — CK TOTAL AND CKMB (NOT AT ARMC)
CK, MB: 3.1 ng/mL (ref 0.5–5.0)
Relative Index: 1.6 (ref 0.0–2.5)
Total CK: 193 U/L (ref 38–234)

## 2016-04-26 LAB — CORTISOL: CORTISOL PLASMA: 42.5 ug/dL

## 2016-04-26 LAB — TROPONIN I
TROPONIN I: 0.04 ng/mL — AB (ref <0.03)
Troponin I: 0.09 ng/mL (ref <0.03)
Troponin I: 0.52 ng/mL (ref <0.03)

## 2016-04-26 LAB — LACTIC ACID, PLASMA
LACTIC ACID, VENOUS: 3.3 mmol/L — AB (ref 0.5–1.9)
Lactic Acid, Venous: 3.4 mmol/L (ref 0.5–1.9)

## 2016-04-26 MED ORDER — SODIUM CHLORIDE 0.9 % IV BOLUS (SEPSIS): 500.0000 mL | Freq: Once | INTRAVENOUS | Status: DC

## 2016-04-26 MED ORDER — SODIUM CHLORIDE 0.9% FLUSH
10.0000 mL | Freq: Two times a day (BID) | INTRAVENOUS | Status: DC
  Administered 2016-04-26: 10 mL
  Administered 2016-04-26: 40 mL
  Administered 2016-04-27 – 2016-05-03 (×10): 10 mL

## 2016-04-26 MED ORDER — BUDESONIDE 0.25 MG/2ML IN SUSP
0.2500 mg | Freq: Two times a day (BID) | RESPIRATORY_TRACT | Status: DC
  Administered 2016-04-26 – 2016-04-27 (×3): 0.25 mg via RESPIRATORY_TRACT
  Filled 2016-04-26 (×2): qty 2

## 2016-04-26 MED ORDER — PANTOPRAZOLE SODIUM 40 MG PO TBEC
40.0000 mg | DELAYED_RELEASE_TABLET | Freq: Every day | ORAL | Status: DC
  Administered 2016-04-26 – 2016-05-03 (×8): 40 mg via ORAL
  Filled 2016-04-26 (×8): qty 1

## 2016-04-26 MED ORDER — SODIUM CHLORIDE 0.9 % IV SOLN: 250.0000 mL | INTRAVENOUS | Status: DC | PRN

## 2016-04-26 MED ORDER — CHLORHEXIDINE GLUCONATE CLOTH 2 % EX PADS
6.0000 | MEDICATED_PAD | Freq: Every day | CUTANEOUS | Status: DC
Start: 2016-04-26 — End: 2016-05-03
  Administered 2016-04-26 – 2016-05-03 (×7): 6 via TOPICAL

## 2016-04-26 MED ORDER — METHYLPREDNISOLONE SODIUM SUCC 125 MG IJ SOLR
60.0000 mg | Freq: Every day | INTRAMUSCULAR | Status: DC
  Administered 2016-04-26: 60 mg via INTRAVENOUS
  Filled 2016-04-26: qty 2

## 2016-04-26 MED ORDER — SODIUM CHLORIDE 0.9% FLUSH
10.0000 mL | INTRAVENOUS | Status: DC | PRN
  Administered 2016-04-29 – 2016-04-30 (×2): 20 mL
  Administered 2016-05-02: 10 mL
  Administered 2016-05-03: 20 mL
  Filled 2016-04-26 (×4): qty 40

## 2016-04-26 MED ORDER — FUROSEMIDE 10 MG/ML IJ SOLN
40.0000 mg | Freq: Once | INTRAMUSCULAR | Status: AC
  Administered 2016-04-26: 40 mg via INTRAVENOUS
  Filled 2016-04-26: qty 4

## 2016-04-26 MED ORDER — MONTELUKAST SODIUM 10 MG PO TABS
10.0000 mg | ORAL_TABLET | Freq: Every day | ORAL | Status: DC
  Administered 2016-04-26 – 2016-05-02 (×7): 10 mg via ORAL
  Filled 2016-04-26 (×7): qty 1

## 2016-04-26 MED ORDER — IPRATROPIUM-ALBUTEROL 0.5-2.5 (3) MG/3ML IN SOLN
3.0000 mL | Freq: Four times a day (QID) | RESPIRATORY_TRACT | Status: DC
  Administered 2016-04-26 – 2016-04-29 (×12): 3 mL via RESPIRATORY_TRACT
  Filled 2016-04-26 (×11): qty 3

## 2016-04-26 NOTE — Progress Notes (Addendum)
PULMONARY / CRITICAL CARE MEDICINE   Name: [name] MRN: [id number] DOB: June 15, 1931    ADMISSION DATE:  04/25/2016  CHIEF COMPLAINT:  Dyspnea  HISTORY OF PRESENT ILLNESS:   For complete details, see H&P by Dr. Maudie Mercury from earlier this evening, but briefly - [name] is a pleasant 83 y/o woman with a complex PMHx most notable for COPD and AF who presented to the ED with PNA. She he significant hypotension and was brought to the ICU for BiPAP and for peripheral pressors. She is DNR/DNI.  Subjective / Interval Events:  Comfortable on BiPAp overnight Phenylephrine at 95 via PIV Wants something to drink  BP (!) 126/96   Pulse 71   Temp (!) 101.8 F (38.8 C) (Core (Comment))   Resp (!) 30   Ht '5\' 5"'$  (1.651 m)   Wt 85.4 kg (188 lb 4.4 oz)   SpO2 100%   BMI 31.33 kg/m   HEMODYNAMICS:    VENTILATOR SETTINGS:    INTAKE / OUTPUT: I/O last 3 completed shifts: In: 7401.8 [P.O.:440; I.V.:4911.8; IV CVELFYBOF:7510] Out: 740 [Urine:740]  PHYSICAL EXAMINATION: General:  Awake, comfortable on BiPAP Neuro:  Wakes easily, answers questions and follows commands.  HEENT:  OP clear, BiPAP on  Cardiovascular:  irreg irreg, 75 Lungs:  Bilateral low pitched wheeze, prolonged exp, L > R Abdomen: soft, benign, + BS Musculoskeletal:  No deformities Skin: no rash  LABS:  BMET  Recent Labs Lab 04/25/16 1140  NA 141  K 4.4  CL 106  CO2 22  BUN 13  CREATININE 1.19*  GLUCOSE 210*    Electrolytes  Recent Labs Lab 04/25/16 1140  CALCIUM 9.4    CBC  Recent Labs Lab 04/25/16 1140  WBC 16.4*  HGB 13.4  HCT 41.2  PLT 209    Coag's No results for input(s): APTT, INR in the last 168 hours.  Sepsis Markers  Recent Labs Lab 04/25/16 1834 04/26/16 0034 04/26/16 0310  LATICACIDVEN 2.9* 3.4* 3.3*    ABG  Recent Labs Lab 04/26/16 0005  PHART 7.312*  PCO2ART 30.7*  PO2ART 86.9    Liver Enzymes  Recent Labs Lab 04/25/16 1140  AST 26  ALT 12*   ALKPHOS 67  BILITOT 1.3*  ALBUMIN 3.4*    Cardiac Enzymes  Recent Labs Lab 04/26/16 0034 04/26/16 0310  TROPONINI 0.04* 0.09*    Glucose  Recent Labs Lab 04/25/16 1720 04/25/16 2048  GLUCAP 152* 119*    Imaging Dg Chest 2 View  Result Date: 04/25/2016 CLINICAL DATA:  Shortness of Breath EXAM: CHEST  2 VIEW COMPARISON:  October 24, 2014 FINDINGS: There is airspace consolidation in the posterior left base. The lungs elsewhere are clear. Heart size and pulmonary vascularity are normal. No adenopathy. Pacemaker leads are attached to the right atrium and middle cardiac vein. No pneumothorax. There is aortic atherosclerosis. Mean no bone lesions evident. IMPRESSION: Airspace consolidation consistent with pneumonia posterior aspect left lower lobe. Lungs elsewhere clear. Aortic atherosclerosis present. Followup PA and lateral chest radiographs recommended in 3-4 weeks following trial of antibiotic therapy to ensure resolution and exclude underlying malignancy. Electronically Signed   By: Lowella Grip III M.D.   On: 04/25/2016 12:43   Dg Chest Port 1 View  Result Date: 04/26/2016 CLINICAL DATA:  Respiratory failure. EXAM: PORTABLE CHEST 1 VIEW COMPARISON:  04/25/2016 FINDINGS: Cardiac pacemaker. Slightly shallow inspiration. Heart size and pulmonary vascularity are normal. Infiltration in the left lower lung is again demonstrated consistent with pneumonia. No significant  progression, lying for differences in technique. Right lung is clear. Calcification of the aorta. No pneumothorax. Degenerative changes in the spine and shoulders. IMPRESSION: Airspace infiltration in the left lower lung consistent with pneumonia. No significant progression. Electronically Signed   By: Lucienne Capers M.D.   On: 04/26/2016 00:27   Dg Abd Portable 1 View  Result Date: 04/25/2016 CLINICAL DATA:  RIGHT upper quadrant pain for 5 days EXAM: PORTABLE ABDOMEN - 1 VIEW COMPARISON:  04/25/2012 FINDINGS:  Single air-filled upper normal caliber loops of small bowel in the mid abdomen. Scattered gas and stool throughout colon. No definite bowel dilatation or bowel wall thickening. Numerous vascular stents throughout the RIGHT iliac system with atherosclerotic calcifications of the LEFT iliac system noted. Bones demineralized with degenerative changes lumbar spine. Prior and LEFT acetabular reconstruction and LEFT hip replacement. IMPRESSION: Nonspecific bowel gas pattern. Electronically Signed   By: Lavonia Dana M.D.   On: 04/25/2016 16:47   STUDIES:  CXR 2/17 >> LLL PNA TTE 2/18 >>   CULTURES: Blood 2/17 x2 >> in process Flu swab 2/17 >> neg Urine 2/17 >> in process Sputum >> needs to be collected  ANTIBIOTICS: Zosyn 2/17 >> Vanc 2/18 >>  SIGNIFICANT EVENTS: 2/17 Hypotension in setting LLL PNA, started pressors.   LINES/TUBES: PIVs Foley 2/18 >>  DISCUSSION: 83 y/o woman with septic shock, LLL PNA  ASSESSMENT / PLAN:  PULMONARY A: Pneumonia, LLL HCAP COPD, no clear exacerbation currently Metabolic acidosis with respiratory compensation Hx OSA on CPAP at home P:   Agree with BiPAP for WOB, try a break this am continue BiPAP qhs given hx OSA and CPAP use abx as below Duo Neb scheduled; on home spiriva + Advair Solumedrol to off - no clear AE-COPD Start budesonide nebs Home singulair  CARDIOVASCULAR A:  Septic Shock Afib [name] CHF, last EF 55% P:  Continue home eliquis, lipitor Hold b-blockade and diuresis for now, restart when BP stabilizing Place PICC for phenylephrine, wean as able. Would also allow CVP which could help with fluid management TTE ordered and pending  RENAL A:   Metabolic Acidosis, likely lactic, w/ some Nongap Acute renal failure P:   Follow lactate for clearance Follow BMP and UOP  GASTROINTESTINAL A:   SUP P:   Start PPI   HEMATOLOGIC A:   AC for AF P:  Cont. Home eliquuis  INFECTIOUS A:   Septic Shock due to PNA P:   MRSA  positive, vanco + Zosyn, adjust based on cx data  ENDOCRINE A:   DM2 P:   Cont insulin regimen  NEUROLOGIC A:   No active issues P:   RASS goal: 0  FAMILY  - Updates: none present currently, will speak with them today 2/18 when available  - Inter-disciplinary family meet or Palliative Care meeting due by:  05/02/16   Independent CC time 33 minutes   Baltazar Apo, MD, PhD 04/26/2016, 7:34 AM [facility] [phone] or if no answer [phone]

## 2016-04-26 NOTE — Progress Notes (Signed)
eLink Physician-Brief Progress Note Patient Name: [name] DOB: [birth year] MRN: [id number]   Date of Service  04/26/2016  HPI/Events of Note  Troponin = 0.04 >>  0.09  >  0.52. Spoke with cardiology on call, Dr. Ashok Norris (who is actually the patient's daughter-in-law), about the patient's elevated troponin. She feels that given the patient's sepsis that she would not be a candidate for PCI at this time and that the elevated troponin most likely represents demand ischemia. She recommended to continue to trend the troponin and get a cardiac echo to assess wall motion. The cardiac echo has already been ordered.  eICU Interventions  Will order: 1. Continue to cycle Troponin.      Intervention Category Intermediate Interventions: Diagnostic test evaluation  Kolina Kube Eugene 04/26/2016, 6:05 PM

## 2016-04-26 NOTE — Progress Notes (Signed)
CRITICAL VALUE ALERT  Critical value received:  Lactic 3.4  Date of notification:  04/26/2016  Time of notification:  0111  Critical value read back:Yes.    Nurse who received alert:  Epimenio Sarin, RN  MD notified (1st page):  Corrie Dandy, MD  Time of first page:  0111  MD notified (2nd page):  Time of second page:  Responding MD:  Corrie Dandy, MD  Time MD responded:  48

## 2016-04-26 NOTE — H&P (Signed)
PULMONARY / CRITICAL CARE MEDICINE   Name: [name] MRN: [id number] DOB: 02-Mar-1932    ADMISSION DATE:  04/25/2016  CHIEF COMPLAINT:  Dyspnea  HISTORY OF PRESENT ILLNESS:   For complete details, see H&P by Dr. Maudie [name] from earlier this evening, but briefly - [name] is a pleasant 83 y/o woman with a complex PMHx most notable for COPD and AF who presented to the ED with PNA. She he significant hypotension and was brought to the ICU for BiPAP and for peripheral pressors. She is DNR/DNI.  PAST MEDICAL HISTORY :  She  has a past medical history of Acute blood loss anemia (04/20/2012); AF (atrial fibrillation) ([name]); Amiodarone pulmonary toxicity; Asthma; Bronchitis; [name] (coronary artery disease); CHF (congestive heart failure) ([name]); Chronic renal insufficiency, stage III (moderate); CKD (chronic kidney disease) stage 3, GFR 30-59 ml/min; Complication of anesthesia; COPD (chronic obstructive pulmonary disease) ([name]); CVA (cerebral vascular accident) ([name]); Depression (06/06/2012); Diabetes ([name]); Diastolic heart failure; Eczema; GERD (gastroesophageal reflux disease); History of skin cancer; cardiovascular stress test; Hyperlipidemia; Invasive ductal carcinoma of breast ([name]) (2011); Mental disorder; OA (osteoarthritis) of knee; Pacemaker; PAD (peripheral artery disease) ([name]); Pain; Pneumonia; Right sided sciatica; Shortness of breath dyspnea; Sinoatrial node dysfunction ([name]); Sleep apnea; Tobacco abuse; and Tremors of nervous system.  PAST SURGICAL HISTORY: She  has a past surgical history that includes Mastectomy, partial (02/03/2010); Cataract extraction, bilateral; Breast lumpectomy; ORIF acetabular fracture (Left, 04/19/2012); Atrial ablation surgery; Vascular surgery; Cardiac catheterization; Total knee arthroplasty (Right, 10/16/2013); sun spot removed ; Total hip arthroplasty (Left, 10/31/2014); and Cardiac catheterization (N/A, 02/13/2015).  Allergies  Allergen Reactions  . Cholestatin  Other (See Comments)    RAGWEED SEASON...sneezing   . Sulfur Itching    No current facility-administered medications on file prior to encounter.    Current Outpatient Prescriptions on File Prior to Encounter  Medication Sig  . acetaminophen (TYLENOL) 650 MG CR tablet Take 1,300 mg by mouth every 8 (eight) hours as needed for pain.  Marland Kitchen AMITIZA 24 MCG capsule TAKE 1 CAPSULE TWICE DAILY WITH A MEAL  . atenolol (TENORMIN) 25 MG tablet Take 1 tablet (25 mg total) by mouth 2 (two) times daily.  Marland Kitchen atorvastatin (LIPITOR) 20 MG tablet TAKE 1 TABLET EVERY DAY  . B-D ULTRA-FINE 33 LANCETS MISC Use to help check blood sugars daily Dx E11.9  . Biotin (BIOTIN 5000) 5 MG CAPS Take 5 mg by mouth at bedtime.   Marland Kitchen ELIQUIS 5 MG TABS tablet TAKE 1 TABLET TWICE DAILY  (MUST  ESTABLISH  NEW  PRIMARY CARE PROVIDER  FOR FUTURE REFILLS)  . Fluticasone-Salmeterol (ADVAIR) 250-50 MCG/DOSE AEPB Inhale 1 puff into the lungs 2 (two) times daily.  Marland Kitchen glucose blood (BAYER CONTOUR TEST) test strip 1 each by Other route daily. Use to check blood sugars every day Dx E11.9  . HYDROcodone-homatropine (HYCODAN) 5-1.5 MG/5ML syrup Take 5 mLs by mouth every 8 (eight) hours as needed for cough.  . levalbuterol (XOPENEX HFA) 45 MCG/ACT inhaler Inhale 2 puffs into the lungs every 6 (six) hours as needed. Reported on 09/23/2015  . levalbuterol (XOPENEX) 0.63 MG/3ML nebulizer solution Take 3 mLs (0.63 mg total) by nebulization every 6 (six) hours as needed for wheezing or shortness of breath.  . metFORMIN (GLUCOPHAGE) 500 MG tablet TAKE 1 TABLET TWICE DAILY WITH A MEAL  . montelukast (SINGULAIR) 10 MG tablet Take 1 tablet (10 mg total) by mouth at bedtime.  . Multiple Vitamins-Minerals (MULTIVITAMIN WITH MINERALS) tablet Take 1 tablet by  mouth 2 (two) times daily.   . potassium chloride SA (K-DUR,KLOR-CON) 20 MEQ tablet Take 40-60 mEq by mouth See admin instructions. 81mq in am, 457m in pm  . tiotropium (SPIRIVA HANDIHALER) 18 MCG  inhalation capsule INHALE THE CONTENTS OF 1 CAPSULE EVERY DAY  . torsemide (DEMADEX) 10 MG tablet TAKE 1 TABLET EVERY DAY    FAMILY HISTORY:  Her indicated that her mother is deceased. She indicated that her father is deceased.    SOCIAL HISTORY: She  reports that she quit smoking about 4 years ago. Her smoking use included Cigarettes. She has a 55.00 pack-year smoking history. She has never used smokeless tobacco. She reports that she drinks alcohol. She reports that she does not use drugs.  REVIEW OF SYSTEMS:   Per HPI  VITAL SIGNS: BP (!) 74/45 (BP Location: Left Arm)   Pulse 78   Temp 100 F (37.8 C) (Rectal)   Resp (!) 30   Ht '5\' 5"'$  (1.651 m)   Wt 188 lb 4.4 oz (85.4 kg)   SpO2 91%   BMI 31.33 kg/m   HEMODYNAMICS:    VENTILATOR SETTINGS:    INTAKE / OUTPUT: I/O last 3 completed shifts: In: 5040 [P.O.:440; I.V.:2600; IV Piggyback:2000] Out: 4047Urine:40]  PHYSICAL EXAMINATION: General:  Awake, alert, in moderate respiratory distress Neuro:  No focal deficits HEENT:  MMM Cardiovascular:  No murmurs, normal S1/S2 Lungs:  Rhonchi throughout, worse on bases Abdomen:  soft Musculoskeletal:  No deformities Skin:  No rashes on visible skin  LABS:  BMET  Recent Labs Lab 04/25/16 1140  NA 141  K 4.4  CL 106  CO2 22  BUN 13  CREATININE 1.19*  GLUCOSE 210*    Electrolytes  Recent Labs Lab 04/25/16 1140  CALCIUM 9.4    CBC  Recent Labs Lab 04/25/16 1140  WBC 16.4*  HGB 13.4  HCT 41.2  PLT 209    Coag's No results for input(s): APTT, INR in the last 168 hours.  Sepsis Markers  Recent Labs Lab 04/25/16 1205 04/25/16 1611 04/25/16 1834  LATICACIDVEN 2.19* 3.2* 2.9*    ABG No results for input(s): PHART, PCO2ART, PO2ART in the last 168 hours.  Liver Enzymes  Recent Labs Lab 04/25/16 1140  AST 26  ALT 12*  ALKPHOS 67  BILITOT 1.3*  ALBUMIN 3.4*    Cardiac Enzymes No results for input(s): TROPONINI, PROBNP in the last  168 hours.  Glucose  Recent Labs Lab 04/25/16 1720 04/25/16 2048  GLUCAP 152* 119*    Imaging Dg Chest 2 View  Result Date: 04/25/2016 CLINICAL DATA:  Shortness of Breath EXAM: CHEST  2 VIEW COMPARISON:  October 24, 2014 FINDINGS: There is airspace consolidation in the posterior left base. The lungs elsewhere are clear. Heart size and pulmonary vascularity are normal. No adenopathy. Pacemaker leads are attached to the right atrium and middle cardiac vein. No pneumothorax. There is aortic atherosclerosis. Mean no bone lesions evident. IMPRESSION: Airspace consolidation consistent with pneumonia posterior aspect left lower lobe. Lungs elsewhere clear. Aortic atherosclerosis present. Followup PA and lateral chest radiographs recommended in 3-4 weeks following trial of antibiotic therapy to ensure resolution and exclude underlying malignancy. Electronically Signed   By: WiLowella GripII M.D.   On: 04/25/2016 12:43   Dg Abd Portable 1 View  Result Date: 04/25/2016 CLINICAL DATA:  RIGHT upper quadrant pain for 5 days EXAM: PORTABLE ABDOMEN - 1 VIEW COMPARISON:  04/25/2012 FINDINGS: Single air-filled upper normal caliber loops of small bowel  in the mid abdomen. Scattered gas and stool throughout colon. No definite bowel dilatation or bowel wall thickening. Numerous vascular stents throughout the RIGHT iliac system with atherosclerotic calcifications of the LEFT iliac system noted. Bones demineralized with degenerative changes lumbar spine. Prior and LEFT acetabular reconstruction and LEFT hip replacement. IMPRESSION: Nonspecific bowel gas pattern. Electronically Signed   By: Lavonia Dana M.D.   On: 04/25/2016 16:47   STUDIES:  CXR 2/17 >> LLL PNA  CULTURES: Blood 2/17 x2 >> in process Flu swab 2/17 >> neg Urine 2/17 >> in process Sputum >> needs to be collected  ANTIBIOTICS: Zosyn 2/17 >> Vanc 2/18 (not yet started) >>  SIGNIFICANT EVENTS: Brought to ICU and started on phenyl, currently  85  LINES/TUBES: PIVs Foley 2/18 >>  DISCUSSION: 83 y/o woman with septic shock of respiratory origin  ASSESSMENT / PLAN:  PULMONARY A: Pneumonia COPD Metabolic acidosis with respiratory compensation P:   BiPAP to decrease WOB Vanc/Zosyn for PNA Continue home tx for COPD  CARDIOVASCULAR A:  Septic Shock Afib [name] CHF, last EF 55% P:  Continue home eliquuis Is s/p 5L NS, more fluids likely to pulm edema -> resp failure (and pt is DNI)  RENAL A:   Metabolic Acidosis, likely lactic, w/ some Nongap Renal insuffiencey  P:   Repeat chemistries to eval gap vs nongap component. Likely some degree of both  GASTROINTESTINAL A:   No active issues P:    HEMATOLOGIC A:   AC for AF P:  Cont. Home eliquuis  INFECTIOUS A:   Septic Shock due to PNA P:   MRSA positive, will add vanc to zosyn  ENDOCRINE A:   DM2 P:   Cont insulin regimen  NEUROLOGIC A:   No active issues P:   RASS goal: 0  FAMILY  - Updates: [facility] updated on transfer  - Inter-disciplinary family meet or Palliative Care meeting due by:  05/02/16  CRITICAL CARE Performed by: Luz Brazen   Total critical care time: 50 minutes  Critical care time was exclusive of separately billable procedures and treating other patients.  Critical care was necessary to treat or prevent imminent or life-threatening deterioration.  Critical care was time spent personally by me on the following activities: development of treatment plan with patient and/or surrogate as well as nursing, discussions with consultants, evaluation of patient's response to treatment, examination of patient, obtaining history from patient or surrogate, ordering and performing treatments and interventions, ordering and review of laboratory studies, ordering and review of radiographic studies, pulse oximetry and re-evaluation of patient's condition.  Luz Brazen, MD [facility] [place] Pager:  [phone]  04/26/2016, 12:24 AM

## 2016-04-26 NOTE — Progress Notes (Signed)
CRITICAL VALUE ALERT  Critical value received:  Troponin 0.04  Date of notification:  04/26/2016  Time of notification:  0120  Critical value read back:Yes.    Nurse who received alert:  Epimenio Sarin, RN  MD notified (1st page):  Corrie Dandy, MD  Time of first page:  0120  MD notified (2nd page):  Time of second page:  Responding MD:  Corrie Dandy, MD  Time MD responded:  [phone]

## 2016-04-26 NOTE — Progress Notes (Signed)
Called son Shelda Truby on cell phone at number listed and got voicemail. Left him a voicemail to let him know she transferred to room 17 on the ICU side.

## 2016-04-26 NOTE — Progress Notes (Signed)
eLink Physician-Brief Progress Note Patient Name: JUNE RODE DOB: 04-04-1931 MRN: [id number]   Date of Service  04/26/2016  HPI/Events of Note  Nurse feels patient may be volume overloaded. Crackles at bases. Sat = 92%, RR = 15. BP = 90/40 w/MAP = 56.  eICU Interventions  Will order: 1. Patient needs to go back on BiPAP.  2. Lasix 40 mg IV X 1 now.     Intervention Category Major Interventions: Other:  Lysle Dingwall 04/26/2016, 9:24 PM

## 2016-04-26 NOTE — Progress Notes (Signed)
Peripherally Inserted Central Catheter/Midline Placement  The IV Nurse has discussed with the patient and/or persons authorized to consent for the patient, the purpose of this procedure and the potential benefits and risks involved with this procedure.  The benefits include less needle sticks, lab draws from the catheter, and the patient may be discharged home with the catheter. Risks include, but not limited to, infection, bleeding, blood clot (thrombus formation), and puncture of an artery; nerve damage and irregular heartbeat and possibility to perform a PICC exchange if needed/ordered by physician.  Alternatives to this procedure were also discussed.  Bard Power PICC patient education guide, fact sheet on infection prevention and patient information card has been provided to patient /or left at bedside.  Son signed consent due to patient's confusion.  PICC/Midline Placement Documentation  PICC Double Lumen 87/57/97 PICC Right Basilic 40 cm 0 cm (Active)  Indication for Insertion or Continuance of Line Vasoactive infusions;Poor Vasculature-patient has had multiple peripheral attempts or PIVs lasting less than 24 hours;Administration of hyperosmolar/irritating solutions (i.e. TPN, Vancomycin, etc.);Chronic illness with exacerbations (CF, Sickle Cell, etc.);Prolonged intravenous therapies;Limited venous access - need for IV therapy >5 days (PICC only) 04/26/2016 12:00 PM  Exposed Catheter (cm) 0 cm 04/26/2016 12:00 PM  Site Assessment Clean;Dry;Intact 04/26/2016 12:00 PM  Lumen #1 Status Flushed;Blood return noted;Saline locked 04/26/2016 12:00 PM  Lumen #2 Status Flushed;Blood return noted;Saline locked 04/26/2016 12:00 PM  Dressing Type Transparent 04/26/2016 12:00 PM  Dressing Status Clean;Dry;Intact;Antimicrobial disc in place 04/26/2016 12:00 PM  Line Care Connections checked and tightened 04/26/2016 12:00 PM  Dressing Intervention New dressing 04/26/2016 12:00 PM  Dressing Change Due 05/03/16 04/26/2016  12:00 PM       [name] 04/26/2016, 12:37 PM

## 2016-04-26 NOTE — Procedures (Signed)
CVP set up

## 2016-04-26 NOTE — Progress Notes (Signed)
eLink Physician-Brief Progress Note Patient Name: [name] DOB: October 13, 1931 MRN: [id number]   Date of Service  04/26/2016  HPI/Events of Note  Follow up on the patient.   Currently asleep. Less distress on the BiPAP.  134/60, 73, 23, 100%.   On Neo-Synephrine.   Lactic acid 3.4. Mildly elevated from previous.   Repeat chest x-ray without significant evidence of pulmonary edema. Still with left lower lobe pneumonia.   eICU Interventions  Continue present management.  Continue antibiotics.  Continue BiPAP.  Continue Neo-Synephrine peripherally.  Avoiding placing CVL in this patient who is a DO NOT RESUSCITATE.   Will dec IVF to 75 mls/hr >> WOF congestion     Intervention Category Evaluation Type: Other  [place] 04/26/2016, 1:54 AM

## 2016-04-26 NOTE — Progress Notes (Signed)
Patient finished NS 1L bolus. Patients' BP now 63/44 (map 52) once back on NS @ 158m/hr. Dr. KMaudie Mercuryat bedside and has spoken to ICU and wants to start Neo-Synephrine to keep MAP above 65. Charge nurse aware and wants patient moved to ICU. Lab called and states patients MRSA PCR is positive. Dr.Kim made aware. Will transfer patient to room 17 asap.

## 2016-04-27 ENCOUNTER — Inpatient Hospital Stay (HOSPITAL_COMMUNITY): Payer: Medicare Other

## 2016-04-27 DIAGNOSIS — J181 Lobar pneumonia, unspecified organism: Secondary | ICD-10-CM

## 2016-04-27 DIAGNOSIS — R748 Abnormal levels of other serum enzymes: Secondary | ICD-10-CM

## 2016-04-27 DIAGNOSIS — J9601 Acute respiratory failure with hypoxia: Secondary | ICD-10-CM

## 2016-04-27 DIAGNOSIS — I4891 Unspecified atrial fibrillation: Secondary | ICD-10-CM

## 2016-04-27 DIAGNOSIS — I5021 Acute systolic (congestive) heart failure: Secondary | ICD-10-CM

## 2016-04-27 LAB — GLUCOSE, CAPILLARY
GLUCOSE-CAPILLARY: 168 mg/dL — AB (ref 65–99)
Glucose-Capillary: 149 mg/dL — ABNORMAL HIGH (ref 65–99)
Glucose-Capillary: 184 mg/dL — ABNORMAL HIGH (ref 65–99)
Glucose-Capillary: 148 mg/dL — ABNORMAL HIGH (ref 65–99)

## 2016-04-27 LAB — URINE CULTURE

## 2016-04-27 LAB — CBC
HEMATOCRIT: 32.7 % — AB (ref 36.0–46.0)
HEMOGLOBIN: 11.1 g/dL — AB (ref 12.0–15.0)
MCH: 29.4 pg (ref 26.0–34.0)
MCHC: 33.9 g/dL (ref 30.0–36.0)
MCV: 86.7 fL (ref 78.0–100.0)
Platelets: 174 10*3/uL (ref 150–400)
RBC: 3.77 MIL/uL — AB (ref 3.87–5.11)
RDW: 16.3 % — ABNORMAL HIGH (ref 11.5–15.5)
WBC: 25.3 10*3/uL — AB (ref 4.0–10.5)

## 2016-04-27 LAB — BASIC METABOLIC PANEL
Anion gap: 12 (ref 5–15)
BUN: 17 mg/dL (ref 6–20)
CO2: 18 mmol/L — ABNORMAL LOW (ref 22–32)
Calcium: 7.3 mg/dL — ABNORMAL LOW (ref 8.9–10.3)
Chloride: 112 mmol/L — ABNORMAL HIGH (ref 101–111)
Creatinine, Ser: 1.17 mg/dL — ABNORMAL HIGH (ref 0.44–1.00)
GFR calc Af Amer: 49 mL/min — ABNORMAL LOW (ref >60)
GFR, EST NON AFRICAN AMERICAN: 42 mL/min — AB (ref >60)
Glucose, Bld: 173 mg/dL — ABNORMAL HIGH (ref 65–99)
POTASSIUM: 2.9 mmol/L — AB (ref 3.5–5.1)
SODIUM: 142 mmol/L (ref 135–145)

## 2016-04-27 LAB — ECHOCARDIOGRAM COMPLETE
HEIGHTINCHES: 65 in
Weight: 3089.97 oz

## 2016-04-27 LAB — HEMOGLOBIN A1C
Hgb A1c MFr Bld: 7 % — ABNORMAL HIGH (ref 4.8–5.6)
Mean Plasma Glucose: 154 mg/dL

## 2016-04-27 LAB — LACTIC ACID, PLASMA
LACTIC ACID, VENOUS: 1.7 mmol/L (ref 0.5–1.9)
Lactic Acid, Venous: 1.9 mmol/L (ref 0.5–1.9)

## 2016-04-27 LAB — MAGNESIUM: Magnesium: 1.7 mg/dL (ref 1.7–2.4)

## 2016-04-27 LAB — TROPONIN I: TROPONIN I: 0.35 ng/mL — AB (ref <0.03)

## 2016-04-27 MED ORDER — BUDESONIDE 0.5 MG/2ML IN SUSP
0.5000 mg | Freq: Two times a day (BID) | RESPIRATORY_TRACT | Status: DC
  Administered 2016-04-27 – 2016-04-29 (×4): 0.5 mg via RESPIRATORY_TRACT
  Filled 2016-04-27 (×4): qty 2

## 2016-04-27 MED ORDER — FUROSEMIDE 10 MG/ML IJ SOLN
40.0000 mg | Freq: Once | INTRAMUSCULAR | Status: AC
  Administered 2016-04-27: 40 mg via INTRAVENOUS

## 2016-04-27 MED ORDER — FUROSEMIDE 10 MG/ML IJ SOLN
INTRAMUSCULAR | Status: AC
  Filled 2016-04-27: qty 4

## 2016-04-27 MED ORDER — GUAIFENESIN-DM 100-10 MG/5ML PO SYRP: 5.0000 mL | ORAL_SOLUTION | ORAL | Status: DC | PRN

## 2016-04-27 MED ORDER — DEXTROSE 5 % IV SOLN
0.0000 ug/min | INTRAVENOUS | Status: DC
  Administered 2016-04-27: 5 ug/min via INTRAVENOUS
  Administered 2016-04-28: 1 ug/min via INTRAVENOUS
  Filled 2016-04-27 (×2): qty 4

## 2016-04-27 NOTE — Progress Notes (Signed)
[facility]  Echocardiogram shows LVEF 20-25%, this is a new finding  Consult cardiology Add levophed, stop neo  Roselie Awkward, MD [facility] Pager: [phone] Cell: [phone] After 3pm or if no response, call [phone]

## 2016-04-27 NOTE — Progress Notes (Signed)
Pt given flutter valve with verbal instructions. Pt able to use it well and coughed up some sputum after using it.

## 2016-04-27 NOTE — Progress Notes (Signed)
Patient resting comfortably on 5L nasal cannula.  Bipap not needed at this time.  Will continue to monitor.

## 2016-04-27 NOTE — Progress Notes (Signed)
PULMONARY / CRITICAL CARE MEDICINE   Name: [name] MRN: [id number] DOB: 02-03-1932    ADMISSION DATE:  04/25/2016  CHIEF COMPLAINT:  Dyspnea  HISTORY OF PRESENT ILLNESS:   For complete details, see H&P by Dr. Maudie Mercury from earlier this evening, but briefly - [name] is a pleasant 83 y/o woman with a complex PMHx most notable for COPD and AF who presented to the ED with PNA. She he significant hypotension and was brought to the ICU for BiPAP and for peripheral pressors. She is DNR/DNI.  Subjective / Interval Events:  Off BIPAP this AM Feels better Has a lot of chest congestion Still some dyspnea  BP (!) 81/47 (BP Location: Left Arm)   Pulse 73   Temp 99.7 F (37.6 C) (Oral)   Resp (!) 27   Ht '5\' 5"'$  (1.651 m)   Wt 87.6 kg (193 lb 2 oz)   SpO2 96%   BMI 32.14 kg/m   HEMODYNAMICS: CVP:  [14 mmHg-15 mmHg] 15 mmHg  VENTILATOR SETTINGS: FiO2 (%):  [45 %] 45 %  INTAKE / OUTPUT: I/O last 3 completed shifts: In: 3990.7 [P.O.:400; I.V.:3140.7; IV Piggyback:450] Out: 2905 [Urine:2905]  PHYSICAL EXAMINATION: General:  Awake, comfortable HEENT: NCAT, OP clear PULM: Rhonchi bilaterally, normal effort  CV: RRR, no mgr GI: BS+, soft, nontender MSK: normal bulk and tone Derm: thin skin, some ankle edema Neuro: Awake, alert, no distress  LABS:  BMET  Recent Labs Lab 04/25/16 1140 04/26/16 0716  NA 141 141  K 4.4 4.3  CL 106 113*  CO2 22 15*  BUN 13 15  CREATININE 1.19* 1.29*  GLUCOSE 210* 151*    Electrolytes  Recent Labs Lab 04/25/16 1140 04/26/16 0716  CALCIUM 9.4 7.8*    CBC  Recent Labs Lab 04/25/16 1140 04/26/16 0716  WBC 16.4* 28.3*  HGB 13.4 14.1  HCT 41.2 42.4  PLT 209 172    Coag's No results for input(s): APTT, INR in the last 168 hours.  Sepsis Markers  Recent Labs Lab 04/25/16 1834 04/26/16 0034 04/26/16 0310  LATICACIDVEN 2.9* 3.4* 3.3*    ABG  Recent Labs Lab 04/26/16 0005  PHART 7.312*  PCO2ART 30.7*  PO2ART  86.9    Liver Enzymes  Recent Labs Lab 04/25/16 1140 04/26/16 0716  AST 26 56*  ALT 12* 26  ALKPHOS 67 60  BILITOT 1.3* 1.3*  ALBUMIN 3.4* 2.8*    Cardiac Enzymes  Recent Labs Lab 04/26/16 0034 04/26/16 0310 04/26/16 1524  TROPONINI 0.04* 0.09* 0.52*    Glucose  Recent Labs Lab 04/25/16 2048 04/26/16 0826 04/26/16 1257 04/26/16 1616 04/26/16 2220 04/27/16 0817  GLUCAP 119* 137* 161* 196* 145* 168*    Imaging Dg Chest Port 1 View  Result Date: 04/27/2016 CLINICAL DATA:  Follow-up examination for pneumonia, shortness of breath. EXAM: PORTABLE CHEST 1 VIEW COMPARISON:  Prior radiograph from 04/26/2016. FINDINGS: Left-sided pacemaker/AICD in place. Right-sided PICC catheter in place with tip overlying the cavoatrial junction, stable. Stable cardiomegaly. Mediastinal silhouette within normal limits. Aortic atherosclerosis. Lung somewhat hypoinflated. Diffuse pulmonary vascular congestion with interstitial prominence, most compatible with pulmonary edema, worsened as compared to previous. Persistent dense retrocardiac left lower lobe opacity. Small left pleural effusion is similar. No pneumothorax. Osseous structures unchanged. Severe degenerative changes about the shoulders bilaterally. IMPRESSION: 1. Stable cardiomegaly with worsened pulmonary edema as compared to 04/26/16. 2. Persistent small left pleural effusion. Associated left basilar opacity may reflect atelectasis and/or infiltrate or edema. Electronically Signed   By:  Jeannine Boga M.D.   On: 04/27/2016 04:25   Dg Chest Port 1 View  Result Date: 04/26/2016 CLINICAL DATA:  Confirm central line placement EXAM: PORTABLE CHEST 1 VIEW COMPARISON:  Yesterday FINDINGS: New right upper extremity PICC with tip at the upper cavoatrial junction. Dual-chamber pacer leads from the left are in stable position. Cardiomegaly with generalized interstitial coarsening and small left effusion. IMPRESSION: 1. New right upper  extremity PICC with tip at the upper cavoatrial junction. 2. Pulmonary edema and small left effusion. Electronically Signed   By: Monte Fantasia M.D.   On: 04/26/2016 13:08   STUDIES:  CXR 2/17 >> LLL PNA TTE 2/18 >>   CULTURES: Blood 2/17 x2 >> in process Flu swab 2/17 >> neg Urine 2/17 >> in process Sputum >> needs to be collected  ANTIBIOTICS: Zosyn 2/17 >> Vanc 2/18 >>  SIGNIFICANT EVENTS: 2/17 Hypotension in setting LLL PNA, started pressors.   LINES/TUBES: PIVs Foley 2/18 >>  DISCUSSION: 83 y/o female with a diagnosis of COPD (no baseline PFT) admitted with acute respiratory failure with hypoxemia due to HCAP.  Had demand ischemia.  ASSESSMENT / PLAN:  PULMONARY A: HCAP Acute respiratory failure with hypoxemia> improving COPD? Chest congestion Aspiration risk? Chronic cough P:   Stop solumedrol Continue bronchodilators and inhaled steroids for now (will increase budesonide) Needs outpatient PFT NPO until speech evaluation BIPAP PRN Titrate O2 to maintain O2 saturation > 90% Flutter valve Chest PT  CARDIOVASCULAR A:  Shock: presumably septic but doesn't appear toxic, tolerating diuresis AFib [name] Demand ischemia Diastolic heart failure, acute  P:  Tele F/u echo Continue neosynephrine for MAP > 60 Repeat lactic acid KVO fluids Lasix again today  RENAL A:   AKI > needs BMET today P:   Monitor BMET and UOP Replace electrolytes as needed   GASTROINTESTINAL A:   Stress ulcer prophylaxis Aspiration risk P:   SLP evaluation NPO  HEMATOLOGIC A:   Anticoagulation for atrial fibrillation P:  Continue eliquis  INFECTIOUS A:   HCAP Suspect leukocytosis is due to soluemedrol P:   Continue Vanc/Zosyn again 2/18, consider narrowing 2/20 Stop solumedrol  ENDOCRINE A:   DM2 P:   SSI  NEUROLOGIC A:   No active issues P:   RASS goal: 0  FAMILY  - Updates: None bedside, called daughter in law [name] 04/27/16 for update  -  Inter-disciplinary family meet or Palliative Care meeting due by:  05/02/16   CCM time 35 minutes  Roselie Awkward, MD [place] [facility] Pager: [phone] Cell: [phone] After 3pm or if no response, call [phone]

## 2016-04-27 NOTE — Progress Notes (Signed)
Started patient on CPT through bed, however patient heart rate began to show vtach and patient began to complain of a headache.  CPT stopped and patient heart rate began to return to normal.

## 2016-04-27 NOTE — Progress Notes (Signed)
Unable to perform CPT at this time due to patient going down for swallow study.

## 2016-04-27 NOTE — Progress Notes (Signed)
Call MD [facility] concerning pts Echo results, orders placed.

## 2016-04-27 NOTE — Progress Notes (Signed)
Modified Barium Swallow Progress Note  Patient Details  Name: [name] MRN: [id number] Date of Birth: [birth year]  Today's Date: 04/27/2016  Modified Barium Swallow completed.  Full report located under Chart Review in the Imaging Section.  Brief recommendations include the following:  Clinical Impression  Pt demonstratess a mild dysphagia, likely respiratory based, characterized by slighty delayed, incomplete laryngeal closure during the swallow. This results in flash penetration before/during the swallow and trace frank penetration and aspiraiton when taking larger sips or consecutive sips.  Inconsistent sensation noted. Advised pt to continue current diet (regular/thin) with particular precaution to take small, single sips, avoid straws and take meds whole in puree. Pt agreeable to plan. Will f/u for tolerance.    Swallow Evaluation Recommendations       SLP Diet Recommendations: Regular solids;Thin liquid   Liquid Administration via: Cup;No straw   Medication Administration: Whole meds with puree   Supervision: Staff to assist with self feeding   Compensations: Slow rate;Small sips/bites   Postural Changes: Seated upright at 90 degrees   Oral Care Recommendations: Oral care BID       Herbie Baltimore, MA CCC-SLP [phone]  Lynann Beaver 04/27/2016,2:13 PM

## 2016-04-27 NOTE — Progress Notes (Signed)
PULMONARY / CRITICAL CARE MEDICINE   Name: [name] MRN: [id number] DOB: March 06, 1932    ADMISSION DATE:  04/25/2016  CHIEF COMPLAINT:  Dyspnea  HISTORY OF PRESENT ILLNESS:   For complete details, see H&P by Dr. Maudie Mercury from earlier this evening, but briefly - [name] is a pleasant 83 y/o woman with a complex PMHx most notable for COPD and AF who presented to the ED with PNA. She he significant hypotension and was brought to the ICU for BiPAP and for peripheral pressors. She is DNR/DNI.  Subjective / Interval Events:  Still pressor dependent  C/o multiple complaints Not wanting to get OOB much d/t fatigue  BP (!) 104/48 (BP Location: Left Arm)   Pulse 71   Temp 98.2 F (36.8 C) (Oral)   Resp (!) 25   Ht '5\' 5"'$  (1.651 m)   Wt 185 lb 6.5 oz (84.1 kg)   SpO2 94%   BMI 30.85 kg/m   5 liters [place] HEMODYNAMICS:   . norepinephrine (LEVOPHED) Adult infusion 2 mcg/min (04/28/16 0800)   VENTILATOR SETTINGS:    INTAKE / OUTPUT:  Intake/Output Summary (Last 24 hours) at 04/28/16 1035 Last data filed at 04/28/16 0900  Gross per 24 hour  Intake          1424.77 ml  Output             3905 ml  Net         -2480.23 ml    General appearance:  83 Year old  female, well nourished NAD  conversant  Eyes: anicteric sclerae, moist conjunctivae; PERRL, EOMI bilaterally. Mouth:  membranes and no mucosal ulcerations; normal hard and soft palate Neck: Trachea midline; neck supple, no JVD Lungs/chest: scattered rhonchi, basilar rales, with normal respiratory effort and no intercostal retractions CV: Regular irreg  no MRGs  Abdomen: Soft, non-tender; no masses or HSM Extremities: trace peripheral edema or extremity lymphadenopathy Skin: Normal temperature, turgor and texture; no rash, ulcers or subcutaneous nodules Psych: Appropriate affect, alert and oriented to person, place and time   LABS:  BMET  Recent Labs Lab 04/26/16 0716 04/27/16 0930 04/28/16 0400  NA 141 142 138  K  4.3 2.9* 2.7*  CL 113* 112* 106  CO2 15* 18* 22  BUN '15 17 15  '$ CREATININE 1.29* 1.17* 1.03*  GLUCOSE 151* 173* 157*    Electrolytes  Recent Labs Lab 04/26/16 0716 04/27/16 0930 04/28/16 0400  CALCIUM 7.8* 7.3* 7.5*  MG  --  1.7 1.6*    CBC  Recent Labs Lab 04/26/16 0716 04/27/16 0930 04/28/16 0400  WBC 28.3* 25.3* 23.4*  HGB 14.1 11.1* 11.1*  HCT 42.4 32.7* 33.2*  PLT 172 174 203    Coag's No results for input(s): APTT, INR in the last 168 hours.  Sepsis Markers  Recent Labs Lab 04/26/16 0310 04/27/16 0930 04/27/16 1511  LATICACIDVEN 3.3* 1.7 1.9    ABG  Recent Labs Lab 04/26/16 0005  PHART 7.312*  PCO2ART 30.7*  PO2ART 86.9    Liver Enzymes  Recent Labs Lab 04/25/16 1140 04/26/16 0716  AST 26 56*  ALT 12* 26  ALKPHOS 67 60  BILITOT 1.3* 1.3*  ALBUMIN 3.4* 2.8*    Cardiac Enzymes  Recent Labs Lab 04/26/16 0310 04/26/16 1524 04/27/16 0930  TROPONINI 0.09* 0.52* 0.35*    Glucose  Recent Labs Lab 04/26/16 2220 04/27/16 0817 04/27/16 1154 04/27/16 1527 04/27/16 2109 04/28/16 0737  GLUCAP 145* 168* 149* 184* 148* 143*  Imaging Dg Swallowing Func-speech Pathology  Result Date: 04/27/2016 Objective Swallowing Evaluation: Type of Study: MBS-Modified Barium Swallow Study Patient Details Name: [name] MRN: [id number] Date of Birth: [birth year] Today's Date: 04/27/2016 Time: SLP Start Time (ACUTE ONLY): 1315-SLP Stop Time (ACUTE ONLY): 1340 SLP Time Calculation (min) (ACUTE ONLY): 25 min Past Medical History: Past Medical History: Diagnosis Date . Acute blood loss anemia 04/20/2012 . AF (atrial fibrillation) ([place])    AV ablation 9/09 [place] per Dr Ola Spurr - AV node ablation 9/11 Dr Caryl Comes . Amiodarone pulmonary toxicity  . Asthma  . Bronchitis   hx of  . [name] (coronary artery disease)   (not sure of this 11/10 . CHF (congestive heart failure) ([place])  . Chronic renal insufficiency, stage III (moderate)   CrCl about 60 ml/min .  CKD (chronic kidney disease) stage 3, GFR 30-59 ml/min  . Complication of anesthesia   "psycotic episode" after hip surg - resolved . COPD (chronic obstructive pulmonary disease) (HCC)   emphysema -FeV1 73% DLCO 53% 5/09 . CVA (cerebral vascular accident) ([place])   no residual effects evident to pt  . Depression 06/06/2012 . Diabetes ([place])  . Diastolic heart failure   Acute on Chronic . Eczema  . GERD (gastroesophageal reflux disease)  . History of skin cancer  . Hx of cardiovascular stress test   Lexiscan Myoview (09/2013):  No definite ischemia, EF 67%; low risk . Hyperlipidemia  . Invasive ductal carcinoma of breast ([place]) 2011  LEFT  . Mental disorder  . OA (osteoarthritis) of knee   RIGHT . Pacemaker   Permanent . PAD (peripheral artery disease) (HCC)   w/hx right iliac/SFA stenting and left and rt leg PTA . Pain   pt states has pain in fingertips per right hand pt states has been told may be carpal tunnel . Pneumonia   hx of  . Right sided sciatica  . Shortness of breath dyspnea   walking distances  . Sinoatrial node dysfunction (HCC)  . Sleep apnea   associated with hypersomnia uses O2 2L/M at night and during naps also uses CPAP . Tobacco abuse  . Tremors of nervous system   hands bilat  Past Surgical History: Past Surgical History: Procedure Laterality Date . ATRIAL ABLATION SURGERY    x 2 - "did not help - had to have pacemaker" . BREAST LUMPECTOMY    left breast . CARDIAC CATHETERIZATION   . CATARACT EXTRACTION, BILATERAL    with IOL/Dr Katy Fitch . EP IMPLANTABLE DEVICE N/A 02/13/2015  Procedure: PPM Generator Changeout-St. Jude device;  Surgeon: Deboraha Sprang, MD;  Location: [facility];  Service: Cardiovascular;  Laterality: N/A; . MASTECTOMY, PARTIAL  02/03/2010  Left/Dr Rosenbower . ORIF ACETABULAR FRACTURE Left 04/19/2012  Procedure: OPEN REDUCTION INTERNAL FIXATION (ORIF) ACETABULAR FRACTURE;  Surgeon: Rozanna Box, MD;  Location: [facility];  Service: Orthopedics;  Laterality: Left; . sun spot removed      forhead . TOTAL HIP ARTHROPLASTY Left 10/31/2014  Procedure: LEFT TOTAL HIP ARTHROPLASTY POSTERIOR  APPROACH;  Surgeon: Gaynelle Arabian, MD;  Location: [facility];  Service: Orthopedics;  Laterality: Left; . TOTAL KNEE ARTHROPLASTY Right 10/16/2013  Procedure: RIGHT TOTAL KNEE ARTHROPLASTY;  Surgeon: Gearlean Alf, MD;  Location: [facility];  Service: Orthopedics;  Laterality: Right; Marland Kitchen VASCULAR SURGERY    both legs  HPI: Ms. Krenz is a pleasant 83 y/o woman with a complex PMHx most notable for COPD and AF who presented to the ED with PNA. She he significant  hypotension and was brought to the ICU for BiPAP and for peripheral pressors. concern for HCAP, aspiration. Pt observed to cough with RN No Data Recorded Assessment / Plan / Recommendation CHL IP CLINICAL IMPRESSIONS 04/27/2016 Clinical Impression Pt demonstrates a mild dysphagia, likely respiratory based, characterized by slighty delayed, incomplete laryngeal closure during the swallow. This results in flash penetration before/during the swallow and trace frank penetration and aspiration when taking larger sips or consecutive sips.  Inconsistent sensation noted. Advised pt to continue current diet (regular/thin) with particular precaution to take small, single sips, avoid straws and take meds whole in puree. Pt agreeable to plan.  May be beneficial to encourage intermittent throat clearing as well. Will f/u for tolerance.  SLP Visit Diagnosis Dysphagia, pharyngeal phase (R13.13) Attention and concentration deficit following -- Frontal lobe and executive function deficit following -- Impact on safety and function Mild aspiration risk   CHL IP TREATMENT RECOMMENDATION 04/27/2016 Treatment Recommendations Therapy as outlined in treatment plan below   Prognosis 04/27/2016 Prognosis for Safe Diet Advancement Good Barriers to Reach Goals -- Barriers/Prognosis Comment -- CHL IP DIET RECOMMENDATION 04/27/2016 SLP Diet Recommendations Regular solids;Thin liquid Liquid  Administration via Cup;No straw Medication Administration Whole meds with puree Compensations Slow rate;Small sips/bites Postural Changes Seated upright at 90 degrees   CHL IP OTHER RECOMMENDATIONS 04/27/2016 Recommended Consults -- Oral Care Recommendations Oral care BID Other Recommendations --   CHL IP FOLLOW UP RECOMMENDATIONS 04/27/2016 Follow up Recommendations 24 hour supervision/assistance   CHL IP FREQUENCY AND DURATION 04/27/2016 Speech Therapy Frequency (ACUTE ONLY) min 2x/week Treatment Duration 1 week      CHL IP ORAL PHASE 04/27/2016 Oral Phase WFL Oral - Pudding Teaspoon -- Oral - Pudding Cup -- Oral - Honey Teaspoon -- Oral - Honey Cup -- Oral - Nectar Teaspoon -- Oral - Nectar Cup -- Oral - Nectar Straw -- Oral - Thin Teaspoon -- Oral - Thin Cup -- Oral - Thin Straw -- Oral - Puree -- Oral - Mech Soft -- Oral - Regular -- Oral - Multi-Consistency -- Oral - Pill -- Oral Phase - Comment --  CHL IP PHARYNGEAL PHASE 04/27/2016 Pharyngeal Phase Impaired Pharyngeal- Pudding Teaspoon -- Pharyngeal -- Pharyngeal- Pudding Cup -- Pharyngeal -- Pharyngeal- Honey Teaspoon -- Pharyngeal -- Pharyngeal- Honey Cup -- Pharyngeal -- Pharyngeal- Nectar Teaspoon -- Pharyngeal -- Pharyngeal- Nectar Cup -- Pharyngeal -- Pharyngeal- Nectar Straw -- Pharyngeal -- Pharyngeal- Thin Teaspoon -- Pharyngeal -- Pharyngeal- Thin Cup Penetration/Aspiration before swallow;Penetration/Aspiration during swallow;Reduced airway/laryngeal closure Pharyngeal Material enters airway, CONTACTS cords and then ejected out Pharyngeal- Thin Straw Penetration/Aspiration before swallow;Penetration/Aspiration during swallow;Reduced airway/laryngeal closure;Trace aspiration Pharyngeal Material enters airway, passes BELOW cords then ejected out;Material enters airway, CONTACTS cords and not ejected out;Material enters airway, CONTACTS cords and then ejected out Pharyngeal- Puree WFL Pharyngeal -- Pharyngeal- Mechanical Soft -- Pharyngeal -- Pharyngeal-  Regular WFL Pharyngeal -- Pharyngeal- Multi-consistency -- Pharyngeal -- Pharyngeal- Pill Penetration/Aspiration before swallow;Penetration/Aspiration during swallow Pharyngeal Material enters airway, CONTACTS cords and then ejected out Pharyngeal Comment --  No flowsheet data found. No flowsheet data found. Herbie Baltimore, [place] CCC-SLP [phone] Lynann Beaver 04/27/2016, 2:14 PM              STUDIES:  CXR 2/17 >> LLL PNA TTE 2/18 >> EF 25-30%; ? takatsubo mid and distal inferior/anterior walls apex and septum hypokinesis. PAP 56 mmHg  CULTURES: Blood 2/17 x2 >>  Flu swab 2/17 >> neg Urine 2/17: enterococcus faecalis (amp sens) Sputum >> needs to be  collected  ANTIBIOTICS: Zosyn 2/17 >>2/20 Vanc 2/18 >>2/20 levaquin 2/20>>>  SIGNIFICANT EVENTS: 2/17 Hypotension in setting LLL PNA, started pressors.   LINES/TUBES: PIVs Foley 2/18 >>  ASSESSMENT / PLAN: PULMONARY  Acute hypoxic respiratory failure in the setting of presumed HCAP + pulmonary edema Possible COPD Chronic cough Dysphagia Aspiration risk  Plan Cont wean O2 Cont BDs (ICS and LABA) Aspiration precautions  Flutter valve and pulm hygiene  Lasix again today (2/20) OOB Repeat CXR in am 2/20  Cardiology Cardiogenic shock ? Component of sepsis although cardiac dysfxn appears to be the major issue Acute diastolic and systolic HF.  ? Takatsubo; EF acutely 20-25% Demand ischemia (troponin peak .52) Atrial fib  ->cards consulted 2/19 ->weaning levopehd Plan Cont tele Cont NOAC  Levophed for SBP >100 Lasix again today (2/20) Eventually consider low dose coreg  Further recs per cards  RENAL AKI-->improved NAGMA and now resolved lactic acidosis. Favor NAGMA component being driven by hyperchloremia  Hypokalemia & Hypomagnesemia  Plan Replace K & Mg Recheck labs in am Strict I&O Renal dose meds   HEMATOLOGY Anemia  Plan  Cont noac Trend CBC  INFECTIOUS DISEASE Possible HCAP Enterococcus  Faecalis UTI (amp sens) Plan Change to levaquin (per pharmacy) complete 10d total abx  ENDOCRINE Diabetes type II Plan ssi   GI Constipation  Plan LOC   Best practice  Diet dysphagia  On NOAC which will cover for DVT as well Activity: OOB SUP w/ PPI   Discussion  This is a 83 year old female admitted w/ HCAP further c/b demand ischemia, new acute systolic HF, and cardiogenic shock. Looking a little better. Will repeat lasix again today, narrow abx and mobilize.   My critical care time is 32 minutes Erick Colace ACNP-BC [place] Pager # [phone] OR # [phone] if no answer   STAFF NOTE: I, Merrie Roof, MD FACP have personally reviewed patient's available data, including medical history, events of note, physical examination and test results as part of my evaluation. I have discussed with resident/NP and other care providers such as pharmacist, RN and RRT. In addition, I personally evaluated patient and elicited key findings of: awake, fc well, some diffuse weakness, strong cough, ronchi, pcxr c/w edema, never too imppresed defined infiltrate, enteroccus noted, for 8 days ABX course for lung ( int changed left mild - atypica;?) and urine, she tolerated neg balance very well, crt down, continued diuresis, K supp, mag supp, phos assessment rerpeat lytes in afteroon, levophed to MAP 55 and sys 90 with good MS given low EF now, mobizlie, I updated pt in full at bedside, no role vasopressin, if unable to get off levophed may need to reduce diuresis The patient is critically ill with multiple organ systems failure and requires high complexity decision making for assessment and support, frequent evaluation and titration of therapies, application of advanced monitoring technologies and extensive interpretation of multiple databases.   Critical Care Time devoted to patient care services described in this note is 30  Minutes. This time reflects time of care of  this signee: Merrie Roof, MD FACP. This critical care time does not reflect procedure time, or teaching time or supervisory time of PA/NP/Med student/Med Resident etc but could involve care discussion time. Rest per NP/medical resident whose note is outlined above and that I agree with   Lavon Paganini. Titus Mould, MD, [place] Pgr: [facility] 04/28/2016 11:20 AM

## 2016-04-27 NOTE — Evaluation (Signed)
Clinical/Bedside Swallow Evaluation Patient Details  Name: [name] MRN: [id number] Date of Birth: [birth year]  Today's Date: 04/27/2016 Time: SLP Start Time (ACUTE ONLY): 62 SLP Stop Time (ACUTE ONLY): 1056 SLP Time Calculation (min) (ACUTE ONLY): 16 min  Past Medical History:  Past Medical History:  Diagnosis Date  . Acute blood loss anemia 04/20/2012  . AF (atrial fibrillation) ([place])     AV ablation 9/09 [place] per Dr Ola Spurr - AV node ablation 9/11 Dr Caryl Comes  . Amiodarone pulmonary toxicity   . Asthma   . Bronchitis    hx of   . [name] (coronary artery disease)    (not sure of this 11/10  . CHF (congestive heart failure) ([place])   . Chronic renal insufficiency, stage III (moderate)    CrCl about 60 ml/min  . CKD (chronic kidney disease) stage 3, GFR 30-59 ml/min   . Complication of anesthesia    "psycotic episode" after hip surg - resolved  . COPD (chronic obstructive pulmonary disease) (H[facility])    emphysema -FeV1 73% DLCO 53% 5/09  . CVA (cerebral vascular accident) ([place])    no residual effects evident to pt   . Depression 06/06/2012  . Diabetes ([place])   . Diastolic heart failure    Acute on Chronic  . Eczema   . GERD (gastroesophageal reflux disease)   . History of skin cancer   . Hx of cardiovascular stress test    Lexiscan Myoview (09/2013):  No definite ischemia, EF 67%; low risk  . Hyperlipidemia   . Invasive ductal carcinoma of breast ([place]) 2011   LEFT   . Mental disorder   . OA (osteoarthritis) of knee    RIGHT  . Pacemaker    Permanent  . PAD (peripheral artery disease) (HCC)    w/hx right iliac/SFA stenting and left and rt leg PTA  . Pain    pt states has pain in fingertips per right hand pt states has been told may be carpal tunnel  . Pneumonia    hx of   . Right sided sciatica   . Shortness of breath dyspnea    walking distances   . Sinoatrial node dysfunction (HCC)   . Sleep apnea    associated with hypersomnia uses O2 2L/M at night and during  naps also uses CPAP  . Tobacco abuse   . Tremors of nervous system    hands bilat    Past Surgical History:  Past Surgical History:  Procedure Laterality Date  . ATRIAL ABLATION SURGERY     x 2 - "did not help - had to have pacemaker"  . BREAST LUMPECTOMY     left breast  . CARDIAC CATHETERIZATION    . CATARACT EXTRACTION, BILATERAL     with IOL/Dr Katy Fitch  . EP IMPLANTABLE DEVICE N/A 02/13/2015   Procedure: PPM Generator Changeout-[employer] device;  Surgeon: Deboraha Sprang, MD;  Location: [facility];  Service: Cardiovascular;  Laterality: N/A;  . MASTECTOMY, PARTIAL  02/03/2010   Left/Dr Rosenbower  . ORIF ACETABULAR FRACTURE Left 04/19/2012   Procedure: OPEN REDUCTION INTERNAL FIXATION (ORIF) ACETABULAR FRACTURE;  Surgeon: Rozanna Box, MD;  Location: [facility];  Service: Orthopedics;  Laterality: Left;  . sun spot removed      forhead  . TOTAL HIP ARTHROPLASTY Left 10/31/2014   Procedure: LEFT TOTAL HIP ARTHROPLASTY POSTERIOR  APPROACH;  Surgeon: Gaynelle Arabian, MD;  Location: [facility];  Service: Orthopedics;  Laterality: Left;  . TOTAL KNEE ARTHROPLASTY  Right 10/16/2013   Procedure: RIGHT TOTAL KNEE ARTHROPLASTY;  Surgeon: Gearlean Alf, MD;  Location: [facility];  Service: Orthopedics;  Laterality: Right;  Marland Kitchen VASCULAR SURGERY     both legs    HPI:  [name] is a pleasant 83 y/o woman with a complex PMHx most notable for COPD and AF who presented to the ED with PNA. She he significant hypotension and was brought to the ICU for BiPAP and for peripheral pressors. concern for HCAP, aspiration. Pt observed to cough with RN   Assessment / Plan / Recommendation Clinical Impression  Pt demonstrates inconclusive signs of aspiration including immediate or slightly delayed coughing following 20% of thin liquid trials. Pt also coughs intermittently when not consuming PO, mkaing subjective assessment futile. Given no complaint of history of choking per pt as well as good strength of swallow  and timing, recommend pt resume PO intake with plan for MBS later today or in am tomorrow for objective assessment of swallowing.  SLP Visit Diagnosis: Dysphagia, unspecified (R13.10)    Aspiration Risk       Diet Recommendation Regular;Thin liquid   Medication Administration: Whole meds with liquid    Other  Recommendations Oral Care Recommendations: Oral care BID   Follow up Recommendations 24 hour supervision/assistance      Frequency and Duration            Prognosis Prognosis for Safe Diet Advancement: Good      Swallow Study   General HPI: [name] is a pleasant 83 y/o woman with a complex PMHx most notable for COPD and AF who presented to the ED with PNA. She he significant hypotension and was brought to the ICU for BiPAP and for peripheral pressors. concern for HCAP, aspiration. Pt observed to cough with RN Type of Study: Bedside Swallow Evaluation Diet Prior to this Study: NPO Temperature Spikes Noted: No Respiratory Status: Nasal cannula History of Recent Intubation: No Behavior/Cognition: Alert;Cooperative;Pleasant mood Oral Cavity Assessment: Within Functional Limits Oral Cavity - Dentition: Dentures, top;Dentures, bottom Self-Feeding Abilities: Needs assist Patient Positioning: Upright in bed Baseline Vocal Quality: Normal Volitional Cough: Congested Volitional Swallow: Able to elicit    Oral/Motor/Sensory Function Overall Oral Motor/Sensory Function: Within functional limits   Ice Chips Ice chips: Not tested   Thin Liquid Thin Liquid: Impaired Presentation: Cup;Straw;Self Fed Pharyngeal  Phase Impairments: Cough - Immediate;Cough - Delayed    Nectar Thick Nectar Thick Liquid: Not tested   Honey Thick Honey Thick Liquid: Not tested   Puree Puree: Within functional limits   Solid   GO   Solid: Within functional limits        Jasara Corrigan, Katherene Ponto 04/27/2016,11:42 AM

## 2016-04-27 NOTE — Consult Note (Signed)
Cardiology Consult    Patient ID: [name] MRN: [id number], DOB/AGE: Nov 21, 1931   Admit date: 04/25/2016 Date of Consult: 04/27/2016  Primary Physician: Tiffany Koch, MD Reason for Consult: Reduced EF, Elevated Troponin Primary Cardiologist: Dr. Caryl [name] PAD Followed by  Dr. Burt [name] Requesting Provider: Dr. Lamonte [name]   History of Present Illness    [name] is a 83 y.o. female with past medical history of nonobstructive [name] (by cath in 12/2002, low-risk NST in 2015), persistent AF (s/p ablation x2, known amiodarone pulmonary toxicity, on Eliquis), symptomatic bradycardia (s/p PPM placement, gen change in 2016), COPD (on 2L [name] nocturnally), [name] (s/p R iliac and SFA stenting), Stage 3 CKD, carotid stenosis, and prior CVA who presented to [name] ED on 04/25/2016 for worsening dyspnea.   Reports having worsening URI symptoms since 02/2016. Seen by PCP and prescribed Singulair with minimal improvement in her symptoms. Symptoms worsened and by review of notes, she was found to have AMS changes on 2/17 upon talking with her son on the phone, therefore he called 911. Upon EMS arrival, she was placed on 2L [name] as oxygen saturations were at 86%.   Initial labs showed WBC 16.4, Hgb 13.4, and platelets 209. K+ 4.4. Creatinine 1.19. Lactic Acid 2.19 (peak of 3.2). Cyclic troponin values have been 0.04, 0.09, 0.52, and 0.35. CXR showed LLL airspace consolidation consistent with PNA.   Was started on broad-spectrum antibiotics and admitted for sepsis secondary to PNA. Was initially hypotensive (SBP in 60's) despote receiving over 5L normal saline. Started on Neo-synephrine at that time.   An echocardiogram was obtained in the setting of her elevated troponin values which shows questionable takotsubo cardiomyopathy with an EF of 25-30% with mid and distal inferior/anterior walls and hypokinetic apex and septum. Mild MR along with moderately dilated LA noted. Neo-synephrine has been  switched to Levophed.  Has been started on IV Lasix as she is +4.4L since admission with weight up from 178 lbs to 193 lbs today.   In talking with the patient, she denies any recent chest discomfort, palpitations, orthopnea, PND or lower extremity edema. Has dyspnea with exertion at baseline which she says is unchanged. She does report being under increased stress recently as she has made the transition of independent living to ALF.    Past Medical History   Past Medical History:  Diagnosis Date  . Acute blood loss anemia 04/20/2012  . AF (atrial fibrillation) ([place])     AV ablation 9/09 [place] per Dr Ola Spurr - AV node ablation 9/11 Dr [name]  . Amiodarone pulmonary toxicity   . Asthma   . Bronchitis    hx of   . [name] (coronary artery disease)    (not sure of this 11/10  . CHF (congestive heart failure) ([place])   . Chronic renal insufficiency, stage III (moderate)    CrCl about 60 ml/min  . CKD (chronic kidney disease) stage 3, GFR 30-59 ml/min   . Complication of anesthesia    "psycotic episode" after hip surg - resolved  . COPD (chronic obstructive pulmonary disease) (HCC)    emphysema -FeV1 73% DLCO 53% 5/09  . CVA (cerebral vascular accident) ([place])    no residual effects evident to pt   . Depression 06/06/2012  . Diabetes ([place])   . Diastolic heart failure    Acute on Chronic  . Eczema   . GERD (gastroesophageal reflux disease)   . History of skin cancer   . Hx of cardiovascular  stress test    Lexiscan Myoview (09/2013):  No definite ischemia, EF 67%; low risk  . Hyperlipidemia   . Invasive ductal carcinoma of breast ([place]) 2011   LEFT   . Mental disorder   . OA (osteoarthritis) of knee    RIGHT  . Pacemaker    Permanent  . PAD (peripheral artery disease) (HCC)    w/hx right iliac/SFA stenting and left and rt leg PTA  . Pain    pt states has pain in fingertips per right hand pt states has been told may be carpal tunnel  . Pneumonia    hx of   . Right sided sciatica     . Shortness of breath dyspnea    walking distances   . Sinoatrial node dysfunction (HCC)   . Sleep apnea    associated with hypersomnia uses O2 2L/M at night and during naps also uses CPAP  . Tobacco abuse   . Tremors of nervous system    hands bilat     Past Surgical History:  Procedure Laterality Date  . ATRIAL ABLATION SURGERY     x 2 - "did not help - had to have pacemaker"  . BREAST LUMPECTOMY     left breast  . CARDIAC CATHETERIZATION    . CATARACT EXTRACTION, BILATERAL     with IOL/Dr Katy Fitch  . EP IMPLANTABLE DEVICE N/A 02/13/2015   Procedure: PPM Generator Changeout-[employer] Jude device;  Surgeon: Deboraha Sprang, MD;  Location: [facility];  Service: Cardiovascular;  Laterality: N/A;  . MASTECTOMY, PARTIAL  02/03/2010   Left/Dr Rosenbower  . ORIF ACETABULAR FRACTURE Left 04/19/2012   Procedure: OPEN REDUCTION INTERNAL FIXATION (ORIF) ACETABULAR FRACTURE;  Surgeon: Rozanna Box, MD;  Location: [facility];  Service: Orthopedics;  Laterality: Left;  . sun spot removed      forhead  . TOTAL HIP ARTHROPLASTY Left 10/31/2014   Procedure: LEFT TOTAL HIP ARTHROPLASTY POSTERIOR  APPROACH;  Surgeon: Gaynelle Arabian, MD;  Location: [facility];  Service: Orthopedics;  Laterality: Left;  . TOTAL KNEE ARTHROPLASTY Right 10/16/2013   Procedure: RIGHT TOTAL KNEE ARTHROPLASTY;  Surgeon: Gearlean Alf, MD;  Location: [facility];  Service: Orthopedics;  Laterality: Right;  Marland Kitchen VASCULAR SURGERY     both legs      Allergies  Allergies  Allergen Reactions  . Cholestatin Other (See Comments)    RAGWEED SEASON...sneezing   . Sulfur Itching    Inpatient Medications    . apixaban  5 mg Oral BID  . atorvastatin  20 mg Oral q1800  . budesonide (PULMICORT) nebulizer solution  0.5 mg Nebulization BID  . chlorhexidine  15 mL Mouth Rinse BID  . Chlorhexidine Gluconate Cloth  6 each Topical Daily  . furosemide      . guaiFENesin  600 mg Oral BID  . insulin aspart  0-20 Units Subcutaneous TID WC  .  ipratropium-albuterol  3 mL Nebulization QID  . mouth rinse  15 mL Mouth Rinse q12n4p  . montelukast  10 mg Oral QHS  . mupirocin ointment  1 application Nasal BID  . pantoprazole  40 mg Oral Daily  . piperacillin-tazobactam (ZOSYN)  IV  3.375 g Intravenous Q8H  . sodium chloride flush  10-40 mL Intracatheter Q12H  . sodium chloride flush  3 mL Intravenous Q12H  . vancomycin  1,000 mg Intravenous Q24H    Family History    Family History  Problem Relation Age of Onset  . Heart disease Father  Social History    Social History   Social History  . Marital status: Married    Spouse name: N/A  . Number of children: N/A  . Years of education: N/A   Occupational History  . Not on file.   Social History Main Topics  . Smoking status: Former Smoker    Packs/day: 1.00    Years: 55.00    Types: Cigarettes    Quit date: 12/19/2011  . Smokeless tobacco: Never Used     Comment: started smoking at age 46--4 cigs per day  . Alcohol use Yes     Comment: wine (rare)  . Drug use: No  . Sexual activity: No   Other Topics Concern  . Not on file   Social History Narrative   HS Graduate; [employer].  Married '61.  2 sons - '70, '71; Dtrs - '64,'68;    6 grandchildren.  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 Interests -Tai-chi & water aerobics, gardening, active lifestyle.  Marriage a bit stressful - SO w/membory problems and difficult behavior. She denies any personal safety concerns. End of life care: need to address at next OV               Review of Systems    General:  No chills, fever, night sweats or weight changes.  Cardiovascular:  No chest pain, edema, orthopnea, palpitations, paroxysmal nocturnal dyspnea. Positive for dyspnea with exertion.  Dermatological: No rash, lesions/masses Respiratory: Positive  for cough and dyspnea Urologic: No hematuria, dysuria Abdominal:   No nausea, vomiting, diarrhea, bright red blood per rectum, melena, or hematemesis Neurologic:  No visual changes, wkns, Positive for changes in mental status. All other systems reviewed and are otherwise negative except as noted above.  Physical Exam    Blood pressure (!) 86/53, pulse 77, temperature 97.7 F (36.5 C), temperature source Oral, resp. rate (!) 22, height _0  (1.651 m), weight 193 lb 2 oz (87.6 kg), SpO2 96 %.  General: Pleasant, Caucasian female appearing in NAD. Psych: Normal affect. Neuro: Alert and oriented X 2 (person, place). Moves all extremities spontaneously. HEENT: Normal  Neck: Supple without bruits. JVD at 9cm. Lungs:  Resp regular and unlabored, rales at bases and rhonchi throughout. Heart: RRR no s3, s4, or murmurs. Abdomen: Soft, non-tender, non-distended, BS + x 4.  Extremities: No clubbing, cyanosis or edema. DP/PT/Radials 2+ and equal bilaterally.  Labs    Troponin (Point of Care Test) No results for input(s): TROPIPOC in the last 72 hours.  Recent Labs  04/26/16 0034 04/26/16 0310 04/26/16 1524 04/27/16 0930  CKTOTAL 193  --   --   --   CKMB 3.1  --   --   --   TROPONINI 0.04* 0.09* 0.52* 0.35*   Lab Results  Component Value Date   WBC 25.3 (H) 04/27/2016   HGB 11.1 (L) 04/27/2016   HCT 32.7 (L) 04/27/2016   MCV 86.7 04/27/2016   PLT 174 04/27/2016    Recent Labs Lab 04/26/16 0716 04/27/16 0930  NA 141 142  K 4.3 2.9*  CL 113* 112*  CO2 15* 18*  BUN 15 17  CREATININE 1.29* 1.17*  CALCIUM 7.8* 7.3*  PROT 6.4*  --   BILITOT 1.3*  --   ALKPHOS 60  --   ALT 26  --   AST 56*  --  GLUCOSE 151* 173*   Lab Results  Component Value Date   CHOL 133 11/22/2015   HDL 43.70 11/22/2015   LDLCALC 53 08/27/2011   TRIG 205.0 (H) 11/22/2015   No results found for: [facility]   Radiology Studies    Dg Chest 2 View  Result Date: 04/25/2016 CLINICAL DATA:  Shortness  of Breath EXAM: CHEST  2 VIEW COMPARISON:  October 24, 2014 FINDINGS: There is airspace consolidation in the posterior left base. The lungs elsewhere are clear. Heart size and pulmonary vascularity are normal. No adenopathy. Pacemaker leads are attached to the right atrium and middle cardiac vein. No pneumothorax. There is aortic atherosclerosis. Mean no bone lesions evident. IMPRESSION: Airspace consolidation consistent with pneumonia posterior aspect left lower lobe. Lungs elsewhere clear. Aortic atherosclerosis present. Followup PA and lateral chest radiographs recommended in 3-4 weeks following trial of antibiotic therapy to ensure resolution and exclude underlying malignancy. Electronically Signed   By: Lowella Grip III M.D.   On: 04/25/2016 12:43   Dg Chest Port 1 View  Result Date: 04/27/2016 CLINICAL DATA:  Follow-up examination for pneumonia, shortness of breath. EXAM: PORTABLE CHEST 1 VIEW COMPARISON:  Prior radiograph from 04/26/2016. FINDINGS: Left-sided pacemaker/AICD in place. Right-sided PICC catheter in place with tip overlying the cavoatrial junction, stable. Stable cardiomegaly. Mediastinal silhouette within normal limits. Aortic atherosclerosis. Lung somewhat hypoinflated. Diffuse pulmonary vascular congestion with interstitial prominence, most compatible with pulmonary edema, worsened as compared to previous. Persistent dense retrocardiac left lower lobe opacity. Small left pleural effusion is similar. No pneumothorax. Osseous structures unchanged. Severe degenerative changes about the shoulders bilaterally. IMPRESSION: 1. Stable cardiomegaly with worsened pulmonary edema as compared to 04/26/16. 2. Persistent small left pleural effusion. Associated left basilar opacity may reflect atelectasis and/or infiltrate or edema. Electronically Signed   By: Jeannine Boga M.D.   On: 04/27/2016 04:25   Dg Chest Port 1 View  Result Date: 04/26/2016 CLINICAL DATA:  Confirm central line  placement EXAM: PORTABLE CHEST 1 VIEW COMPARISON:  Yesterday FINDINGS: New right upper extremity PICC with tip at the upper cavoatrial junction. Dual-chamber pacer leads from the left are in stable position. Cardiomegaly with generalized interstitial coarsening and small left effusion. IMPRESSION: 1. New right upper extremity PICC with tip at the upper cavoatrial junction. 2. Pulmonary edema and small left effusion. Electronically Signed   By: Monte Fantasia M.D.   On: 04/26/2016 13:08   Dg Chest Port 1 View  Result Date: 04/26/2016 CLINICAL DATA:  Respiratory failure. EXAM: PORTABLE CHEST 1 VIEW COMPARISON:  04/25/2016 FINDINGS: Cardiac pacemaker. Slightly shallow inspiration. Heart size and pulmonary vascularity are normal. Infiltration in the left lower lung is again demonstrated consistent with pneumonia. No significant progression, lying for differences in technique. Right lung is clear. Calcification of the aorta. No pneumothorax. Degenerative changes in the spine and shoulders. IMPRESSION: Airspace infiltration in the left lower lung consistent with pneumonia. No significant progression. Electronically Signed   By: Lucienne Capers M.D.   On: 04/26/2016 00:27   Dg Abd Portable 1 View  Result Date: 04/25/2016 CLINICAL DATA:  RIGHT upper quadrant pain for 5 days EXAM: PORTABLE ABDOMEN - 1 VIEW COMPARISON:  04/25/2012 FINDINGS: Single air-filled upper normal caliber loops of small bowel in the mid abdomen. Scattered gas and stool throughout colon. No definite bowel dilatation or bowel wall thickening. Numerous vascular stents throughout the RIGHT iliac system with atherosclerotic calcifications of the LEFT iliac system noted. Bones demineralized with degenerative changes lumbar spine. Prior and LEFT acetabular  reconstruction and LEFT hip replacement. IMPRESSION: Nonspecific bowel gas pattern. Electronically Signed   By: Lavonia Dana M.D.   On: 04/25/2016 16:47    EKG & Cardiac Imaging    EKG: Paced,  HR 70.  Echocardiogram: 04/27/2016  Study Conclusions  - Left ventricle: ? Takatsubo mid and distal inferior/anterior   walls apex and septum hypokinetic. Wall thickness was increased   in a pattern of mild LVH. Systolic function was severely reduced.   The estimated ejection fraction was in the range of 25% to 30%.   Doppler parameters are consistent with both elevated ventricular   end-diastolic filling pressure and elevated left atrial filling   pressure. - Mitral valve: Severely calcified annulus. Moderately thickened,   moderately calcified leaflets . There was mild regurgitation.   Valve area by continuity equation (using LVOT flow): 2.48 cm^2. - Left atrium: The atrium was moderately dilated. - Atrial septum: No defect or patent foramen ovale was identified. - Pulmonary arteries: PA peak pressure: 56 mm Hg (S). - Impressions: If high suspicion for SBE consider TEE given degree   of MAC and pacing wires.  Impressions:  - If high suspicion for SBE consider TEE given degree of MAC and   pacing wires.  Nuclear Stress Test: 09/2013 Overall Impression:  This is a low-risk scan. There is question of a mild fixed defect affecting parts of the inferior wall and inferolateral wall. However there is interference from nuclear activity from structures below the diaphragm. I believe that this interference causes the defects seen on the images. There is no definite ischemia. There is excellent wall motion. This argues against significant scar.  LV Ejection Fraction: 67%.  LV Wall Motion:  Wall motion is normal.   Assessment & Plan    1. New Systolic CHF - EF previously 55-60% in 09/2013. Admitted for Sepsis secondary to HCAP and repeat echo shows questionable takotsubo cardiomyopathy with an EF of 25-30% with mid and distal inferior/anterior walls and hypokinetic apex and septum - has been receiving IVF in the setting of hypotension and sepsis. Overall, +4.4L since admission. Weight  up from 178 lbs to 193 lbs today (at 180 lbs during office visit in 02/2016). Started on IV Lasix yesterday with over -2.0L output. Continue to obtain daily weights and strict I&O's.  - BP variable at 64/40 - 113/77 in the past 24 hours. Has been switched from Neo-synephrine to Levophed. Not a candidate for BB/ACE-I/ARB/Spiro at this time secondary to hypotension. Once BP improves, consider addition of low-dose Coreg (on Atenolol PTA).   2. Elevated Troponin  - cyclic troponin values have been 0.04, 0.09, 0.52, and 0.35.  - echo shows reduced EF of 25-30% concerning for takotsubo cardiomyopathy. -has a history of nonobstructive [name] (by cath in 12/2002) and low-risk NST in 2015. She denies any recent chest discomfort or palpitations. Will obtain repeat EKG as initial tracing has artifact. Not a candidate for invasive evaluation at this time in the setting of her acute illness.   3. Persistent AF - s/p ablation x2, history of pulmonary toxicity with Amiodarone - This patients CHA2DS2-VASc Score and unadjusted Ischemic Stroke Rate (% per year) is equal to 12.2 % stroke rate/year from a score of 9 (CHF, HTN, DM, PAD, Female, Age (2), CVA (2)). Continue Eliquis 66m BID.   4. Symptomatic bradycardia - s/p PPM placement, gen change in 2016. - followed by Dr. KCaryl [name] 5. Stage 3 CKD - baseline 1.1 - 1.2 - stable at 1.17 today.  6. HCAP/ Septic Shock - presented with worsening respiratory failure and hypoxia.  - CXR showed LLL airspace consolidation consistent with PNA. WBC at 25.3. - on Vancomycin and Zosyn. Neo-synephrine has been switched to Levophed in the setting of reduced EF.    Signed, Erma Heritage, PA-C 04/27/2016, 1:37 PM Pager: [phone]  Patient seen, examined. Available data reviewed. Agree with findings, assessment, and plan as outlined by Bernerd Pho, PA-C. The patient is known to me from the outpatient setting. She is hospitalized with pneumonia complicated by shock  requiring vasopressor support. Her serum lactate has been elevated. She has elevated troponin without clinical chest pain or pressure. Her symptoms have primarily been respiratory in nature with cough and shortness of breath requiring supplemental oxygen to maintain an adequate oxygen saturation.  On my evaluation, the patient is alert and oriented. She is in no acute distress. JVP appears normal, lungs are coarse with diffuse rhonchi. Heart is regular rate and rhythm without murmur or gallop. Abdomen is soft and nontender, extremities have no edema.  The EKG demonstrates ventricular pacing. Troponin peak is 0.52. The patient's echocardiogram is reviewed and demonstrates severe LV systolic dysfunction with LVEF estimated 25-30%. This is a new finding from previous studies. I think the most likely etiology for her documented LV dysfunction is either stress-induced cardiomyopathy (Takotsubo) or sepsis-related cardiomyopathy. She is currently not a candidate for either invasive cardiac evaluation or aggressive medical therapy because of ongoing hypotension and respiratory failure. Would simply continue with supportive care and vasopressors as you are currently doing in order to maintain an adequate systemic arterial pressure. The patient is anticoagulated with apixaban (no role for IV heparin). Otherwise plan as outlined above. Will follow along with you.  Sherren Mocha, M.D. 04/27/2016 3:55 PM

## 2016-04-28 DIAGNOSIS — J9600 Acute respiratory failure, unspecified whether with hypoxia or hypercapnia: Secondary | ICD-10-CM

## 2016-04-28 DIAGNOSIS — R6520 Severe sepsis without septic shock: Secondary | ICD-10-CM

## 2016-04-28 DIAGNOSIS — R0602 Shortness of breath: Secondary | ICD-10-CM

## 2016-04-28 LAB — MAGNESIUM
Magnesium: 1.6 mg/dL — ABNORMAL LOW (ref 1.7–2.4)
Magnesium: 1.9 mg/dL (ref 1.7–2.4)

## 2016-04-28 LAB — BASIC METABOLIC PANEL
ANION GAP: 10 (ref 5–15)
Anion gap: 10 (ref 5–15)
BUN: 15 mg/dL (ref 6–20)
BUN: 12 mg/dL (ref 6–20)
CO2: 22 mmol/L (ref 22–32)
CO2: 24 mmol/L (ref 22–32)
CREATININE: 0.91 mg/dL (ref 0.44–1.00)
Calcium: 7.5 mg/dL — ABNORMAL LOW (ref 8.9–10.3)
Calcium: 8.1 mg/dL — ABNORMAL LOW (ref 8.9–10.3)
Chloride: 106 mmol/L (ref 101–111)
Chloride: 107 mmol/L (ref 101–111)
Creatinine, Ser: 1.03 mg/dL — ABNORMAL HIGH (ref 0.44–1.00)
GFR calc Af Amer: >60 mL/min (ref >60)
GFR, EST AFRICAN AMERICAN: 57 mL/min — AB (ref >60)
GFR, EST NON AFRICAN AMERICAN: 49 mL/min — AB (ref >60)
GFR, EST NON AFRICAN AMERICAN: 57 mL/min — AB (ref >60)
GLUCOSE: 169 mg/dL — AB (ref 65–99)
Glucose, Bld: 157 mg/dL — ABNORMAL HIGH (ref 65–99)
POTASSIUM: 2.7 mmol/L — AB (ref 3.5–5.1)
Potassium: 4 mmol/L (ref 3.5–5.1)
SODIUM: 138 mmol/L (ref 135–145)
SODIUM: 141 mmol/L (ref 135–145)

## 2016-04-28 LAB — GLUCOSE, CAPILLARY
GLUCOSE-CAPILLARY: 143 mg/dL — AB (ref 65–99)
Glucose-Capillary: 117 mg/dL — ABNORMAL HIGH (ref 65–99)
Glucose-Capillary: 161 mg/dL — ABNORMAL HIGH (ref 65–99)
Glucose-Capillary: 117 mg/dL — ABNORMAL HIGH (ref 65–99)

## 2016-04-28 LAB — CBC
HEMATOCRIT: 33.2 % — AB (ref 36.0–46.0)
HEMOGLOBIN: 11.1 g/dL — AB (ref 12.0–15.0)
MCH: 28.5 pg (ref 26.0–34.0)
MCHC: 33.4 g/dL (ref 30.0–36.0)
MCV: 85.3 fL (ref 78.0–100.0)
Platelets: 203 10*3/uL (ref 150–400)
RBC: 3.89 MIL/uL (ref 3.87–5.11)
RDW: 15.9 % — ABNORMAL HIGH (ref 11.5–15.5)
WBC: 23.4 10*3/uL — AB (ref 4.0–10.5)

## 2016-04-28 LAB — PHOSPHORUS: PHOSPHORUS: 1 mg/dL — AB (ref 2.5–4.6)

## 2016-04-28 MED ORDER — POLYETHYLENE GLYCOL 3350 17 G PO PACK
17.0000 g | PACK | Freq: Every day | ORAL | Status: DC
  Administered 2016-04-28 – 2016-05-03 (×5): 17 g via ORAL
  Filled 2016-04-28 (×6): qty 1

## 2016-04-28 MED ORDER — FUROSEMIDE 10 MG/ML IJ SOLN
40.0000 mg | Freq: Once | INTRAMUSCULAR | Status: AC
  Administered 2016-04-28: 40 mg via INTRAVENOUS
  Filled 2016-04-28: qty 4

## 2016-04-28 MED ORDER — POTASSIUM & SODIUM PHOSPHATES 280-160-250 MG PO PACK
2.0000 | PACK | Freq: Three times a day (TID) | ORAL | Status: AC
  Administered 2016-04-28 – 2016-04-29 (×3): 2 via ORAL
  Filled 2016-04-28 (×3): qty 2

## 2016-04-28 MED ORDER — LEVOFLOXACIN 750 MG PO TABS
750.0000 mg | ORAL_TABLET | ORAL | Status: DC
  Administered 2016-04-28: 750 mg via ORAL
  Filled 2016-04-28: qty 1

## 2016-04-28 MED ORDER — MAGNESIUM HYDROXIDE 400 MG/5ML PO SUSP: 30.0000 mL | Freq: Every day | ORAL | Status: DC | PRN

## 2016-04-28 MED ORDER — POTASSIUM CHLORIDE CRYS ER 20 MEQ PO TBCR
40.0000 meq | EXTENDED_RELEASE_TABLET | ORAL | Status: AC
  Administered 2016-04-28 (×2): 40 meq via ORAL
  Filled 2016-04-28 (×2): qty 2

## 2016-04-28 MED ORDER — POTASSIUM CHLORIDE 20 MEQ/15ML (10%) PO SOLN
40.0000 meq | Freq: Once | ORAL | Status: DC
  Filled 2016-04-28: qty 30

## 2016-04-28 MED ORDER — POTASSIUM CHLORIDE CRYS ER 20 MEQ PO TBCR
40.0000 meq | EXTENDED_RELEASE_TABLET | Freq: Once | ORAL | Status: AC
  Administered 2016-04-28: 40 meq via ORAL
  Filled 2016-04-28: qty 2

## 2016-04-28 MED ORDER — FLEET ENEMA 7-19 GM/118ML RE ENEM
1.0000 | ENEMA | Freq: Every day | RECTAL | Status: DC | PRN
  Filled 2016-04-28: qty 1

## 2016-04-28 MED ORDER — MAGNESIUM SULFATE 2 GM/50ML IV SOLN
2.0000 g | Freq: Once | INTRAVENOUS | Status: AC
  Administered 2016-04-28: 2 g via INTRAVENOUS
  Filled 2016-04-28: qty 50

## 2016-04-28 NOTE — Progress Notes (Signed)
CRITICAL VALUE ALERT  Critical value received:  K 2.7  Date of notification:  04/28/2016  Time of notification:  1003  Critical value read back:Yes.    Nurse who received alert:  Arnetha Courser, RN  MD notified (1st Siearra Amberg): [phone], [facility] nurse aware, to pass info on to MD

## 2016-04-28 NOTE — Progress Notes (Signed)
eLink Physician-Brief Progress Note Patient Name: CERIA SUMINSKI DOB: May 11, 1931 MRN: [id number]   Date of Service  04/28/2016  HPI/Events of Note    eICU Interventions  hypophos -repleted CPAP q hs ordered     Intervention Category Intermediate Interventions: Electrolyte abnormality - evaluation and management  ALVA,RAKESH V. 04/28/2016, 8:27 PM

## 2016-04-28 NOTE — Progress Notes (Signed)
Pt cries and screams out in pain during BP checks, refuses to wear BP cuff, bargained with patient that we would reduce checks to every 3-4 hours. Recent BP checks have been within appropriate parameters.

## 2016-04-28 NOTE — Progress Notes (Signed)
RT note: patient resting comfortably on nasal cannula, with no distress.  Bipap not needed at this time.  Will continue to monitor.

## 2016-04-28 NOTE — Progress Notes (Signed)
Pharmacy Antibiotic Note  [name] is a 83 y.o. female with possible HCAP.  Pharmacy has been consulted for vancomycin and zosyn dosing. Blood cultures ngtd, urine cultures with enterococcus. Changing to levaquin for a total of 10 days antibiotics -WBC 23.4, afebrile -SCr 1.03, CrCl ~ 45  Plan: -Levaquin '750mg'$  po q48hr (last dose 2/26) -Will follow renal function and patient progress  Height: '5\' 5"'$  (165.1 cm) Weight: 185 lb 6.5 oz (84.1 kg) IBW/kg (Calculated) : 57  Temp (24hrs), Avg:98.3 F (36.8 C), Min:97.7 F (36.5 C), Max:98.9 F (37.2 C)   Recent Labs Lab 04/25/16 1140  04/25/16 1834 04/26/16 0034 04/26/16 0310 04/26/16 0716 04/27/16 0930 04/27/16 1511 04/28/16 0400  WBC 16.4*  --   --   --   --  28.3* 25.3*  --  23.4*  CREATININE 1.19*  --   --   --   --  1.29* 1.17*  --  1.03*  LATICACIDVEN  --   < > 2.9* 3.4* 3.3*  --  1.7 1.9  --   < > = values in this interval not displayed.  Estimated Creatinine Clearance: 45.1 mL/min (by C-G formula based on SCr of 1.03 mg/dL (H)).    Allergies  Allergen Reactions  . Cholestatin Other (See Comments)    RAGWEED SEASON...sneezing   . Sulfur Itching    Antimicrobials this admission: Vanc 2/17 >> 2/20 Zosyn 2/17 >>2/20 LQ 2/20>>2/26  Dose adjustments this admission: n/a  Microbiology results: 2/17 BCx: ngtd 2/17 UCx: enterococcus (20K). Pan-S 2/17 MRSA pcr +  Thank you for allowing pharmacy to be a part of this patient's care.  Hildred Laser, Pharm D 04/28/2016 11:04 AM

## 2016-04-28 NOTE — Progress Notes (Signed)
   Complete cardiology consultation was done yesterday. There is nothing to add today.  Daryel November, MD

## 2016-04-28 NOTE — Progress Notes (Signed)
Pharmacy Antibiotic Note  [name] is a 83 y.o. female with possible HCAP.  Pharmacy has been consulted for vancomycin and zosyn dosing. Blood cultures ngtd, urine cultures with enterococcus (20K; probably insignificant) -WBC 23.4, afebrile -SCr 1.03, CrCl ~ 24  Plan: -No zosyn dose changes needed (consider narrowing therapy) -Consider discontinuing vancomycin? -Monitor renal function, cultures, clinical progress, VT at steady state  Height: '5\' 5"'$  (165.1 cm) Weight: 185 lb 6.5 oz (84.1 kg) IBW/kg (Calculated) : 57  Temp (24hrs), Avg:98.3 F (36.8 C), Min:97.7 F (36.5 C), Max:98.9 F (37.2 C)   Recent Labs Lab 04/25/16 1140  04/25/16 1834 04/26/16 0034 04/26/16 0310 04/26/16 0716 04/27/16 0930 04/27/16 1511 04/28/16 0400  WBC 16.4*  --   --   --   --  28.3* 25.3*  --  23.4*  CREATININE 1.19*  --   --   --   --  1.29* 1.17*  --  1.03*  LATICACIDVEN  --   < > 2.9* 3.4* 3.3*  --  1.7 1.9  --   < > = values in this interval not displayed.  Estimated Creatinine Clearance: 45.1 mL/min (by C-G formula based on SCr of 1.03 mg/dL (H)).    Allergies  Allergen Reactions  . Cholestatin Other (See Comments)    RAGWEED SEASON...sneezing   . Sulfur Itching    Antimicrobials this admission: Vanc 2/17 >> Zosyn 2/17 >>  Dose adjustments this admission: n/a  Microbiology results: 2/17 BCx: ngtd 2/17 UCx: enterococcus (20K). Pan-S 2/17 MRSA pcr +  Thank you for allowing pharmacy to be a part of this patient's care.  Hildred Laser, Pharm D 04/28/2016 8:24 AM

## 2016-04-28 NOTE — Progress Notes (Signed)
[facility] Electrolyte Replacement Protocol  Patient Name: [name] DOB: October 21, 1931 MRN: [id number]  Date of Service  04/28/2016   HPI/Events of Note    Recent Labs Lab 04/25/16 1140 04/26/16 0716 04/27/16 0930 04/28/16 0400  NA 141 141 142 138  K 4.4 4.3 2.9* 2.7*  CL 106 113* 112* 106  CO2 22 15* 18* 22  GLUCOSE 210* 151* 173* 157*  BUN '13 15 17 15  '$ CREATININE 1.19* 1.29* 1.17* 1.03*  CALCIUM 9.4 7.8* 7.3* 7.5*  MG  --   --  1.7 1.6*    Estimated Creatinine Clearance: 45.1 mL/min (by C-G formula based on SCr of 1.03 mg/dL (H)).  Intake/Output      02/19 0701 - 02/20 0700   P.O. 360   I.V. (mL/kg) 394.6 (4.7)   IV Piggyback 300   Total Intake(mL/kg) 1054.6 (12.5)   Urine (mL/kg/hr) 3555 (1.8)   Total Output 3555   Net -2500.4        - I/O DETAILED x24h    Total I/O In: 163 [I.V.:113; IV Piggyback:50] Out: 705 [Urine:705] - I/O THIS SHIFT    ASSESSMENT   eICURN Interventions  Abnormal electrolytes replaced using ICU protocol   ASSESSMENT: [name], [name] 04/28/2016, 4:57 AM

## 2016-04-28 NOTE — Progress Notes (Signed)
This note also relates to the following rows which could not be included: Pulse Rate - Cannot attach notes to unvalidated device data SpO2 - Cannot attach notes to unvalidated device data  Pt placed on CPAP of 5cmH2O via FFM with 4LO2 bleed in.  Patient tolerating at this time.    04/28/16 2307  BiPAP/CPAP/SIPAP  BiPAP/CPAP/SIPAP Pt Type Adult  Mask Type Full face mask  Mask Size Small  Respiratory Rate 21 breaths/min  EPAP 5 cmH2O  Flow Rate 4 lpm  BiPAP/CPAP/SIPAP CPAP  Patient Home Equipment No  Auto Titrate No  BiPAP/CPAP /SiPAP Vitals  Resp (!) 21  Bilateral Breath Sounds Clear;Diminished

## 2016-04-29 ENCOUNTER — Inpatient Hospital Stay (HOSPITAL_COMMUNITY): Payer: Medicare Other

## 2016-04-29 LAB — MAGNESIUM: MAGNESIUM: 1.8 mg/dL (ref 1.7–2.4)

## 2016-04-29 LAB — GLUCOSE, CAPILLARY
GLUCOSE-CAPILLARY: 123 mg/dL — AB (ref 65–99)
GLUCOSE-CAPILLARY: 139 mg/dL — AB (ref 65–99)
Glucose-Capillary: 143 mg/dL — ABNORMAL HIGH (ref 65–99)
Glucose-Capillary: 90 mg/dL (ref 65–99)

## 2016-04-29 LAB — BASIC METABOLIC PANEL
ANION GAP: 8 (ref 5–15)
BUN: 10 mg/dL (ref 6–20)
CO2: 23 mmol/L (ref 22–32)
Calcium: 7.8 mg/dL — ABNORMAL LOW (ref 8.9–10.3)
Chloride: 105 mmol/L (ref 101–111)
Creatinine, Ser: 0.9 mg/dL (ref 0.44–1.00)
GFR calc Af Amer: >60 mL/min (ref >60)
GFR, EST NON AFRICAN AMERICAN: 58 mL/min — AB (ref >60)
Glucose, Bld: 136 mg/dL — ABNORMAL HIGH (ref 65–99)
POTASSIUM: 3.8 mmol/L (ref 3.5–5.1)
SODIUM: 136 mmol/L (ref 135–145)

## 2016-04-29 LAB — PHOSPHORUS: PHOSPHORUS: 1.5 mg/dL — AB (ref 2.5–4.6)

## 2016-04-29 MED ORDER — POTASSIUM CHLORIDE CRYS ER 20 MEQ PO TBCR
20.0000 meq | EXTENDED_RELEASE_TABLET | Freq: Every day | ORAL | Status: DC
  Administered 2016-04-29 – 2016-05-03 (×5): 20 meq via ORAL
  Filled 2016-04-29 (×5): qty 1

## 2016-04-29 MED ORDER — MOMETASONE FURO-FORMOTEROL FUM 200-5 MCG/ACT IN AERO
2.0000 | INHALATION_SPRAY | Freq: Two times a day (BID) | RESPIRATORY_TRACT | Status: DC
  Administered 2016-05-01 – 2016-05-03 (×4): 2 via RESPIRATORY_TRACT
  Filled 2016-04-29 (×2): qty 8.8

## 2016-04-29 MED ORDER — TIOTROPIUM BROMIDE MONOHYDRATE 18 MCG IN CAPS
18.0000 ug | ORAL_CAPSULE | Freq: Every day | RESPIRATORY_TRACT | Status: DC
  Administered 2016-05-02 – 2016-05-03 (×2): 18 ug via RESPIRATORY_TRACT
  Filled 2016-04-29 (×2): qty 5

## 2016-04-29 MED ORDER — FUROSEMIDE 40 MG PO TABS
40.0000 mg | ORAL_TABLET | Freq: Every day | ORAL | Status: DC
  Administered 2016-04-30 – 2016-05-03 (×4): 40 mg via ORAL
  Filled 2016-04-29 (×4): qty 1

## 2016-04-29 MED ORDER — LEVOFLOXACIN 750 MG PO TABS
750.0000 mg | ORAL_TABLET | Freq: Every day | ORAL | Status: AC
  Administered 2016-04-29 – 2016-05-01 (×3): 750 mg via ORAL
  Filled 2016-04-29 (×4): qty 1

## 2016-04-29 NOTE — Progress Notes (Addendum)
PULMONARY / CRITICAL CARE MEDICINE   Name: [name] MRN: [id number] DOB: 1931/12/15    ADMISSION DATE:  04/25/2016  CHIEF COMPLAINT:  Dyspnea  HISTORY OF PRESENT ILLNESS:   For complete details, see H&P by Dr. Maudie Mercury from earlier this evening, but briefly - [name] is a pleasant 83 y/o [name] with a complex PMHx most notable for COPD and AF who presented to [facility] ED with PNA. [name] he significant hypotension and was brought to [facility] ICU for BiPAP and for peripheral pressors. [name] is DNR/DNI.  Subjective / Interval Events:  Feels much better.  BP (!) 100/57   Pulse Tiffany   Temp 97.8 F (36.6 C) (Oral)   Resp (!) 26   Ht '5\' 5"'$  (1.651 m)   Wt 181 lb Tiffany oz (82.3 kg)   SpO2 94%   BMI 30.19 kg/m  3 liters [place]    . norepinephrine (LEVOPHED) Adult infusion Stopped (04/29/16 0600)   VENTILATOR SETTINGS:    INTAKE / OUTPUT:  Intake/Output Summary (Last 24 hours) at 04/29/16 0932 Last data filed at 04/29/16 0600  Gross per 24 hour  Intake            316.5 ml  Output             3300 ml  Net          -2983.5 ml    General appearance:  83 Year old [name], well nourished  NAD, conversant  Eyes: anicteric sclerae, moist conjunctivae; PERRL, EOMI bilaterally. Mouth:  membranes and no mucosal ulcerations; normal hard and soft palate Neck: Trachea midline; neck supple, no JVD Lungs/chest: crackles both bases, with normal respiratory effort and no intercostal retractions CV: Regular irreg , no MRGs  Abdomen: Soft, non-tender; no masses or HSM Extremities: No peripheral edema or extremity lymphadenopathy Skin: Normal temperature, turgor and texture; no rash, ulcers or subcutaneous nodules Psych: Appropriate affect, alert and oriented to person, place and time   LABS:  BMET  Recent Labs Lab 04/28/16 0400 04/28/16 1815 04/29/16 0400  NA 138 141 136  K 2.Tiffany* 4.0 3.8  CL 106 107 105  CO2 '22 24 23  '$ BUN '15 12 10  '$ CREATININE 1.03* 0.91 0.90  GLUCOSE 157* 169* 136*     Electrolytes  Recent Labs Lab 04/28/16 0400 04/28/16 1815 04/29/16 0400  CALCIUM Tiffany.5* 8.1* Tiffany.8*  MG 1.6* 1.9 1.8  PHOS  --  1.0* 1.5*    CBC  Recent Labs Lab 04/26/16 0716 04/27/16 0930 04/28/16 0400  WBC 28.3* 25.3* 23.4*  HGB 14.1 11.1* 11.1*  HCT 42.4 32.Tiffany* 33.2*  PLT 172 174 203    Coag's No results for input(s): APTT, INR in Tiffany last 168 hours.  Sepsis Markers  Recent Labs Lab 04/26/16 0310 04/27/16 0930 04/27/16 1511  LATICACIDVEN 3.3* 1.Tiffany 1.9    ABG  Recent Labs Lab 04/26/16 0005  PHART Tiffany.312*  PCO2ART 30.Tiffany*  PO2ART 86.9    Liver Enzymes  Recent Labs Lab 04/25/16 1140 04/26/16 0716  AST 26 56*  ALT 12* 26  ALKPHOS 67 60  BILITOT 1.3* 1.3*  ALBUMIN 3.4* 2.8*    Cardiac Enzymes  Recent Labs Lab 04/26/16 0310 04/26/16 1524 04/27/16 0930  TROPONINI 0.09* 0.52* 0.35*    Glucose  Recent Labs Lab 04/27/16 2109 04/28/16 0737 04/28/16 1133 04/28/16 1632 04/28/16 2156 04/29/16 0806  GLUCAP 148* 143* 117* 161* 117* 123*    Imaging Dg Chest Port 1 View  Result Date: 04/29/2016 CLINICAL DATA:  83 year old [name] with shortness of breath and pneumonia. EXAM: PORTABLE CHEST 1 VIEW COMPARISON:  Chest radiograph dated 04/27/2016 FINDINGS: Right-sided PICC with tip over right atrium in stable positioning. Left pectoral dual lead pacemaker device. There is mild cardiomegaly with pulmonary vascular congestion and mild interstitial edema. Overall Tiffany degree of vascular congestion and edema is relatively similar or slightly improved since Tiffany prior radiograph. There is a small left pleural effusion with left lung base hazy density, likely atelectasis versus infiltrate. No pneumothorax. There is degenerative changes of Tiffany spine and shoulders. No acute osseous pathology. IMPRESSION: 1. Cardiomegaly with mild vascular congestion and pulmonary edema, relatively similar prior study. 2. Small left pleural effusion with left lung base  atelectasis versus infiltrate. Electronically Signed   By: Anner Crete M.D.   On: 04/29/2016 06:06   STUDIES:  CXR 2/17 >> LLL PNA TTE 2/18 >> EF 25-30%; ? takatsubo mid and distal inferior/anterior walls apex and septum hypokinesis. PAP 56 mmHg  CULTURES: Blood 2/17 x2 >>  Flu swab 2/17 >> neg Urine 2/17: enterococcus faecalis (amp sens) Sputum >> needs to be collected  ANTIBIOTICS: Zosyn 2/17 >>2/20 Vanc 2/18 >>2/20 levaquin 2/20>>>  SIGNIFICANT EVENTS: 2/17 Hypotension in setting LLL PNA, started pressors.   LINES/TUBES: PIVs Foley 2/18 >>  ASSESSMENT / PLAN:  Resolved issues AKI Lactic acidosis NAGMA Hyperchloremia  Hypomagnesemia and Hypokalemia  Cardiogenic shock  Constipation   Active issues  Acute hypoxic respiratory failure in [facility] setting of presumed HCAP + pulmonary edema Possible COPD Dysphagia Aspiration risk  CXR & O2 requirements improved.  Plan Cont wean O2 Resume home BDs Aspiration precautions  Flutter valve and pulm hygiene Day 4/8 abx OOB   Acute diastolic and systolic HF.  ? Takatsubo; EF acutely 20-25% Demand ischemia (troponin peak .52) Atrial fib  ->cards consulted 2/19 ->off levophed Plan Cont tele Cont NOAC  SBP goal > 90  Eventually consider low dose coreg -not able Further recs per cards   Anemia Plan  Cont NOAC Intermittent CBC  Enterococcus Faecalis UTI Plan Day 4/8 abx  DM type II Plan ssi    Best practice  Diet dysphagia  On NOAC which will cover for DVT as well Activity: OOB SUP w/ PPI   Discussion  Looks much better. Aeration improved on CXR. Will accept BP in 90s. Mobilize. Complete 8d abx. Ready for tele. Will s/o triad assume care w/ further recs from cards pending.   Erick Colace ACNP-BC [place] Pager # [phone] OR # [phone] if no answer  STAFF NOTE: I, Merrie Roof, MD FACP have personally reviewed [name]'s available data, including medical history,  events of note, physical examination and test results as part of my evaluation. I have discussed with resident/NP and other care providers such as pharmacist, RN and RRT. In addition, I personally evaluated [name] and elicited key findings of: awake alert, good spirits, no distress, neg 3 liters, no chest pain reported, pcxr with residual pulm edema and likely left effusion, [name] tolerated lasix very well, now borderline BP, consider reduction lasix, [name] is on min O2, [name] perfuses well with MAp 50, sysd 80, would accept those BP, and also attempt different cuff location, reasonable for ABX course Tiffany days then dc for enteroccus and empiric pNA left LL  Lavon Paganini. Titus Mould, MD, [place] Pgr: [facility] 04/29/2016 11:31 AM

## 2016-04-29 NOTE — Progress Notes (Signed)
The patient was seen by Dr. Burt Knack for cardiology today.  Daryel November, MD

## 2016-04-29 NOTE — Care Management Important Message (Signed)
Important Message  Patient Details  Name: [name] MRN: [id number] Date of Birth: 1931/04/01   Medicare Important Message Given:  Yes    Lacretia Leigh, RN 04/29/2016, 11:28 AM

## 2016-04-29 NOTE — Evaluation (Signed)
Physical Therapy Evaluation Patient Details Name: [name] MRN: [id number] DOB: [birth year] Today's Date: 04/29/2016   History of Present Illness   83 y/o woman with a complex PMHx most notable for COPD and AF who presented to the ED with PNA and ARF.  Clinical Impression  Patient demonstrates deficits in functional mobility as indicated below. Will need continued skilled PT to address deficits and maximize function. Will see as indicated and progress as tolerated.   Recommend RETURN TO ALF with HHPT  Follow Up Recommendations RETURN TO ALF with Home health PT;Supervision/Assistance - 24 hour;Supervision for mobility/OOB    Equipment Recommendations  None recommended by PT    Recommendations for Other Services       Precautions / Restrictions Precautions Precautions: Fall Restrictions Weight Bearing Restrictions: No      Mobility  Bed Mobility Overal bed mobility: Needs Assistance Bed Mobility: Supine to Sit;Sit to Supine     Supine to sit: Min assist Sit to supine: Min assist   General bed mobility comments: increased time and effort to perform, min assist to elevate to upright, and scoot to EOB, assist to elevate LEs back to bed  Transfers Overall transfer level: Needs assistance Equipment used: Rolling walker (2 wheeled) Transfers: Sit to/from Stand Sit to Stand: Min assist         General transfer comment: Min assist to power up to standing and for stability  Ambulation/Gait Ambulation/Gait assistance: Min assist Ambulation Distance (Feet): 16 Feet Assistive device: Rolling walker (2 wheeled) Gait Pattern/deviations: Step-to pattern;Step-through pattern     General Gait Details: limited by patient anxiety  Stairs            Wheelchair Mobility    Modified Rankin (Stroke Patients Only)       Balance Overall balance assessment: Needs assistance Sitting-balance support: Bilateral upper extremity supported;Feet supported Sitting  balance-Leahy Scale: Fair       Standing balance-Leahy Scale: Poor Standing balance comment: reliance on UE support and min assist                             Pertinent Vitals/Pain      Home Living Family/patient expects to be discharged to:: Assisted living Living Arrangements: Spouse/significant other Available Help at Discharge: Family;Available 24 hours/day Type of Home: House       Home Layout: One level Home Equipment: [place] - 2 wheels;Walker - 4 wheels Additional Comments: patient poor historian    Prior Function Level of Independence: Independent with assistive device(s)               Hand Dominance   Dominant Hand: Right    Extremity/Trunk Assessment   Upper Extremity Assessment Upper Extremity Assessment: Generalized weakness    Lower Extremity Assessment Lower Extremity Assessment: Generalized weakness       Communication   Communication: No difficulties  Cognition Arousal/Alertness: Awake/alert Behavior During Therapy: Anxious Overall Cognitive Status: No family/caregiver present to determine baseline cognitive functioning                 General Comments: patient very confused re: home information, states she just "cant focus or remember"    General Comments      Exercises     Assessment/Plan    PT Assessment Patient needs continued PT services  PT Problem List Decreased strength;Decreased activity tolerance;Decreased balance;Decreased mobility;Decreased coordination;Decreased cognition;Decreased knowledge of use of DME;Cardiopulmonary status limiting activity  PT Treatment Interventions DME instruction;Gait training;Functional mobility training;Therapeutic activities;Therapeutic exercise;Balance training;Patient/family education    PT Goals (Current goals can be found in the Care Plan section)  Acute Rehab PT Goals Patient Stated Goal: none stated PT Goal Formulation: With patient Time For Goal  Achievement: 05/13/16 Potential to Achieve Goals: Good    Frequency Min 3X/week   Barriers to discharge        Co-evaluation               End of Session Equipment Utilized During Treatment: Gait belt;Oxygen Activity Tolerance: Patient limited by fatigue Patient left: in bed;with call bell/phone within reach;with bed alarm set Nurse Communication: Mobility status PT Visit Diagnosis: Muscle weakness (generalized) (M62.81)         Time: 4656-8127 PT Time Calculation (min) (ACUTE ONLY): 21 min   Charges:   PT Evaluation $PT Eval Moderate Complexity: 1 Procedure     PT G Codes:         Duncan Dull 2016-05-04, 5:04 PM Alben Deeds, [place] DPT  [phone]

## 2016-04-29 NOTE — Progress Notes (Signed)
    Subjective:  Feeling a little better. Still short of breath. Sitting up in chair. No chest pain.  Objective:  Vital Signs in the last 24 hours: Temp:  [97.8 F (36.6 C)-98.4 F (36.9 C)] 98.4 F (36.9 C) (02/21 1129) Pulse Rate:  [67-78] 74 (02/21 0744) Resp:  [16-34] 26 (02/21 0744) BP: (85-120)/(45-76) 100/57 (02/21 0744) SpO2:  [89 %-100 %] 94 % (02/21 0744) Weight:  [181 lb 7 oz (82.3 kg)] 181 lb 7 oz (82.3 kg) (02/21 0625)  Intake/Output from previous day: 02/20 0701 - 02/21 0700 In: 455.3 [P.O.:400; I.V.:55.3] Out: 3550 [Urine:3550]  Physical Exam: Pt is alert and oriented, pleasant elderly woman in NAD HEENT: normal Neck: JVP - normal Lungs: diffuse rhonchi bilaterally CV: RRR  Abd: soft, NT Ext: no C/C/E Skin: warm/dry no rash   Lab Results:  Recent Labs  04/27/16 0930 04/28/16 0400  WBC 25.3* 23.4*  HGB 11.1* 11.1*  PLT 174 203    Recent Labs  04/28/16 1815 04/29/16 0400  NA 141 136  K 4.0 3.8  CL 107 105  CO2 24 23  GLUCOSE 169* 136*  BUN 12 10  CREATININE 0.91 0.90    Recent Labs  04/26/16 1524 04/27/16 0930  TROPONINI 0.52* 0.35*    Tele: Paced rhythm 70 bpm  Assessment/Plan:  1. Acute respiratory failure: HCAP/COPD/pulmonary edema/effusion 2. Acute systolic heart failure: LVEF < 30% by echo likely sepsis-related versus Takotsubo cardiomyopathy 3. Elevated troponin: suspect secondary to critical illness (demand ischemia) 4. Permanent atrial fibrillation - on apixaban 5. Shock: mixed cardiogenic/critical illness: improved now off of pressors  Overall stable from CV perspective. She is now off of vasoactive medication. Lung exam still with diffuse rhonchi. With borderline BP and rhonchi on lung exam, would wait another 24 hours to start beta-blockade to avoid a setback in her progress. BP too low for ACE/ARB today. Ultimately will benefit from both beta-blocker/ACE or ARB as tolerated. Will follow with you.  Sherren Mocha,  M.D. 04/29/2016, 12:41 PM

## 2016-04-29 NOTE — Progress Notes (Signed)
Pharmacy Antibiotic Note  [name] is a 83 y.o. female with possible HCAP.  Pharmacy has been consulted for vancomycin and zosyn dosing. Blood cultures ngtd, urine cultures with enterococcus. Changing to levaquin for a total of 10 days antibiotics -WBC 23.4, afebrile -SCr 0.9, CrCl ~ 50  Plan: -Change Levaquin to '750mg'$  po q24hr (last dose 2/26) -Will sign off. Please contact pharmacy with any other needs.   Height: '5\' 5"'$  (165.1 cm) Weight: 181 lb 7 oz (82.3 kg) IBW/kg (Calculated) : 57  Temp (24hrs), Avg:98 F (36.7 C), Min:97.8 F (36.6 C), Max:98.2 F (36.8 C)   Recent Labs Lab 04/25/16 1140  04/25/16 1834 04/26/16 0034 04/26/16 0310 04/26/16 0716 04/27/16 0930 04/27/16 1511 04/28/16 0400 04/28/16 1815 04/29/16 0400  WBC 16.4*  --   --   --   --  28.3* 25.3*  --  23.4*  --   --   CREATININE 1.19*  --   --   --   --  1.29* 1.17*  --  1.03* 0.91 0.90  LATICACIDVEN  --   < > 2.9* 3.4* 3.3*  --  1.7 1.9  --   --   --   < > = values in this interval not displayed.  Estimated Creatinine Clearance: 51 mL/min (by C-G formula based on SCr of 0.9 mg/dL).    Allergies  Allergen Reactions  . Cholestatin Other (See Comments)    RAGWEED SEASON...sneezing   . Sulfur Itching    Antimicrobials this admission: Vanc 2/17 >> 2/20 Zosyn 2/17 >>2/20 LQ 2/20>>2/26  Dose adjustments this admission: n/a  Microbiology results: 2/17 BCx: ngtd 2/17 UCx: enterococcus (20K). Pan-S 2/17 MRSA pcr +  Thank you for allowing pharmacy to be a part of this patient's care.  Hildred Laser, Pharm D 04/29/2016 9:12 AM

## 2016-04-30 DIAGNOSIS — N390 Urinary tract infection, site not specified: Secondary | ICD-10-CM

## 2016-04-30 DIAGNOSIS — I480 Paroxysmal atrial fibrillation: Secondary | ICD-10-CM

## 2016-04-30 DIAGNOSIS — I5021 Acute systolic (congestive) heart failure: Secondary | ICD-10-CM

## 2016-04-30 LAB — GLUCOSE, CAPILLARY
GLUCOSE-CAPILLARY: 106 mg/dL — AB (ref 65–99)
GLUCOSE-CAPILLARY: 134 mg/dL — AB (ref 65–99)
Glucose-Capillary: 111 mg/dL — ABNORMAL HIGH (ref 65–99)
Glucose-Capillary: 96 mg/dL (ref 65–99)

## 2016-04-30 LAB — CULTURE, BLOOD (ROUTINE X 2)
CULTURE: NO GROWTH
CULTURE: NO GROWTH

## 2016-04-30 LAB — BASIC METABOLIC PANEL
ANION GAP: 9 (ref 5–15)
BUN: 10 mg/dL (ref 6–20)
CHLORIDE: 105 mmol/L (ref 101–111)
CO2: 24 mmol/L (ref 22–32)
CREATININE: 0.84 mg/dL (ref 0.44–1.00)
Calcium: 8.2 mg/dL — ABNORMAL LOW (ref 8.9–10.3)
GFR calc non Af Amer: >60 mL/min (ref >60)
Glucose, Bld: 103 mg/dL — ABNORMAL HIGH (ref 65–99)
Potassium: 4.1 mmol/L (ref 3.5–5.1)
SODIUM: 138 mmol/L (ref 135–145)

## 2016-04-30 LAB — MAGNESIUM: MAGNESIUM: 2 mg/dL (ref 1.7–2.4)

## 2016-04-30 MED ORDER — LOSARTAN POTASSIUM 25 MG PO TABS
12.5000 mg | ORAL_TABLET | Freq: Every day | ORAL | Status: DC
  Administered 2016-04-30 – 2016-05-03 (×4): 12.5 mg via ORAL
  Filled 2016-04-30 (×4): qty 1

## 2016-04-30 NOTE — Discharge Instructions (Signed)

## 2016-04-30 NOTE — Progress Notes (Addendum)
PROGRESS NOTE  [name] MCN:[id number] DOB: 11-15-1931 DOA: 04/25/2016 PCP: Hoyt Koch, MD  HPI/Recap of past 24 hours:  Sitting up in chair, on 4liter oxygen, denies chest pain, no sob, no cough, no fever, no edema  Assessment/Plan: Principal Problem:   Acute respiratory failure (HCC) Active Problems:   Controlled diabetes mellitus type 2 with complications ([place])   Hyperlipidemia   Obstructive sleep apnea   Coronary atherosclerosis   Atrial fibrillation (HCC)   Hypotension   Diastolic CHF, chronic (HCC)   Depression   GERD (gastroesophageal reflux disease)   Community acquired pneumonia   Severe sepsis ([place])   Septic shock (HCC)   SOB (shortness of breath)  Acute on chronic hypoxic respiratory failure/ from pneumonia and acute systolic heart failure, septic shock and cardiogenic shock: She presented to the ED with confusion, hypotension on 2/17, she is admitted to hospitalist service initially, due to persistent hypoxia, hypotension despite fluids resuscitation and iv abx. she is transferred to ICU, she was started on pressors with levophed and neo and bipap, she has improved, she is weaned off pressors on 2/20.  She is transferred to hospitalist service on 2/22  Sepsis/septic shock from pna,  Enterococcus uti Patient report upper respiratory symptom for a few weeks prior to coming to the hospital Blood culture negative, urine culture 20,000 enterococcus sensitive, sputum sample was not collected Still with leukocytosis, no fever, bp stable She is treated with vanc/zosyn initially, currently she is on levaquin, last dose end on 6/29  Acute systolic chf, UT65-46%, from sepsis-related versus Takotsubo cardiomyopathy per cardiology  troponin elevation, troponin peaked at 0.52, likely demand ischemia per cardiology CXR on 2/21: 1. Cardiomegaly with mild vascular congestion and pulmonary edema,relatively similar prior study. 2. Small left pleural effusion with  left lung base atelectasis versus infiltrate. No lower extremity edema on exam Currently on cozaar (started on 2/22), lasix '40mg'$  daily, urine output 1.2 liter last 24hrs  Permanent afib: s/p pulmonary vein ablation and junction ablation in the past on apixaban, rate controlled without meds, home meds atenolol held, cardiology following H/o bradycardia s/p pacemaker placement  AKI on CKD III, cr 1.29 on admission, cr improving, cr 0.8on 2/22  Hypokalemia/hypomagnesemia: replaced, monitor  COPD , quit smoking 39yrago, on home o2 at night, on cpap at night  currently no wheezing. Continue advair (dulera inhouse)/spiriva/singulair  noninsulin dependent dm2, a1c 7, home med metfromin held, on ssi here  MRSA colonization: on contact precaution and decolonization protocol  Code Status: DNR  Family Communication: patient   Disposition Plan: remain in the hospital,  need cardiology clearance prior to discharge  Need to remove picc line prior to discharge Need to continue home o2 at discharge  ALF with home health/SNF  At discharge   Consultants:  [facility], transfer to icu on 2/17  [facility]  Procedures:  bipap  picc line placement on 2/18  Antibiotics:  vanc on 2/17 and 2/18  Zosyn from 2/17 to 2/20  levaquin from 2/20   Objective: BP 106/75 (BP Location: Left Arm)   Pulse 73   Temp 98.2 F (36.8 C) (Oral)   Resp 18   Ht '5\' 5"'$  (1.651 m)   Wt 80.3 kg (177 lb)   SpO2 94%   BMI 29.45 kg/m   Intake/Output Summary (Last 24 hours) at 04/30/16 1611 Last data filed at 04/30/16 1458  Gross per 24 hour  Intake  1100 ml  Output             1200 ml  Net             -100 ml   Filed Weights   04/29/16 0625 04/29/16 1634 04/30/16 0500  Weight: 82.3 kg (181 lb 7 oz) 80.2 kg (176 lb 14.4 oz) 80.3 kg (177 lb)    Exam:   General:  NAD  Cardiovascular: RRR  Respiratory: diminished at basis, mild scattered rhonchi, no  wheezing  Abdomen: Soft/ND/NT, positive BS  Musculoskeletal: No Edema  Neuro: aaox3  Data Reviewed: Basic Metabolic Panel:  Recent Labs Lab 04/27/16 0930 04/28/16 0400 04/28/16 1815 04/29/16 0400 04/30/16 0407  NA 142 138 141 136 138  K 2.9* 2.7* 4.0 3.8 4.1  CL 112* 106 107 105 105  CO2 18* '22 24 23 24  '$ GLUCOSE 173* 157* 169* 136* 103*  BUN '17 15 12 10 10  '$ CREATININE 1.17* 1.03* 0.91 0.90 0.84  CALCIUM 7.3* 7.5* 8.1* 7.8* 8.2*  MG 1.7 1.6* 1.9 1.8 2.0  PHOS  --   --  1.0* 1.5*  --    Liver Function Tests:  Recent Labs Lab 04/25/16 1140 04/26/16 0716  AST 26 56*  ALT 12* 26  ALKPHOS 67 60  BILITOT 1.3* 1.3*  PROT 7.1 6.4*  ALBUMIN 3.4* 2.8*   No results for input(s): LIPASE, AMYLASE in the last 168 hours. No results for input(s): AMMONIA in the last 168 hours. CBC:  Recent Labs Lab 04/25/16 1140 04/26/16 0716 04/27/16 0930 04/28/16 0400  WBC 16.4* 28.3* 25.3* 23.4*  NEUTROABS 14.6*  --   --   --   HGB 13.4 14.1 11.1* 11.1*  HCT 41.2 42.4 32.7* 33.2*  MCV 88.8 90.0 86.7 85.3  PLT 209 172 174 203   Cardiac Enzymes:    Recent Labs Lab 04/26/16 0034 04/26/16 0310 04/26/16 1524 04/27/16 0930  CKTOTAL 193  --   --   --   CKMB 3.1  --   --   --   TROPONINI 0.04* 0.09* 0.52* 0.35*   BNP (last 3 results) No results for input(s): BNP in the last 8760 hours.  ProBNP (last 3 results) No results for input(s): PROBNP in the last 8760 hours.  CBG:  Recent Labs Lab 04/29/16 1130 04/29/16 1648 04/29/16 2142 04/30/16 0736 04/30/16 1144  GLUCAP 139* 143* 90 106* 134*    Recent Results (from the past 240 hour(s))  Blood Culture (routine x 2)     Status: None   Collection Time: 04/25/16 11:40 AM  Result Value Ref Range Status   Specimen Description BLOOD RIGHT ANTECUBITAL  Final   Special Requests BOTTLES DRAWN AEROBIC AND ANAEROBIC  5CC  Final   Culture NO GROWTH 5 DAYS  Final   Report Status 04/30/2016 FINAL  Final  Blood Culture (routine  x 2)     Status: None   Collection Time: 04/25/16 11:58 AM  Result Value Ref Range Status   Specimen Description BLOOD RIGHT FOREARM  Final   Special Requests BOTTLES DRAWN AEROBIC AND ANAEROBIC 5CC  Final   Culture NO GROWTH 5 DAYS  Final   Report Status 04/30/2016 FINAL  Final  Urine culture     Status: Abnormal   Collection Time: 04/25/16 12:16 PM  Result Value Ref Range Status   Specimen Description URINE, RANDOM  Final   Special Requests Immunocompromised  Final   Culture 20,000 COLONIES/mL ENTEROCOCCUS FAECALIS (A)  Final   Report  Status 04/27/2016 FINAL  Final   Organism ID, Bacteria ENTEROCOCCUS FAECALIS (A)  Final      Susceptibility   Enterococcus faecalis - MIC*    AMPICILLIN <=2 SENSITIVE Sensitive     LEVOFLOXACIN 1 SENSITIVE Sensitive     NITROFURANTOIN <=16 SENSITIVE Sensitive     VANCOMYCIN 1 SENSITIVE Sensitive     * 20,000 COLONIES/mL ENTEROCOCCUS FAECALIS  MRSA PCR Screening     Status: Abnormal   Collection Time: 04/25/16  7:44 PM  Result Value Ref Range Status   MRSA by PCR POSITIVE (A) NEGATIVE Final    Comment:        The GeneXpert MRSA Assay (FDA approved for NASAL specimens only), is one component of a comprehensive MRSA colonization surveillance program. It is not intended to diagnose MRSA infection nor to guide or monitor treatment for MRSA infections. RESULT CALLED TO, READ BACK BY AND VERIFIED WITH: MCKIBBEN,K RN [id number] 04/25/16 MITCHELL,L      Studies: No results found.  Scheduled Meds: . apixaban  5 mg Oral BID  . atorvastatin  20 mg Oral q1800  . chlorhexidine  15 mL Mouth Rinse BID  . Chlorhexidine Gluconate Cloth  6 each Topical Daily  . furosemide  40 mg Oral Daily  . guaiFENesin  600 mg Oral BID  . insulin aspart  0-20 Units Subcutaneous TID WC  . levofloxacin  750 mg Oral Daily  . losartan  12.5 mg Oral Daily  . mometasone-formoterol  2 puff Inhalation BID  . montelukast  10 mg Oral QHS  . mupirocin ointment  1 application  Nasal BID  . pantoprazole  40 mg Oral Daily  . polyethylene glycol  17 g Oral Daily  . potassium chloride  40 mEq Oral Once  . potassium chloride  20 mEq Oral Daily  . sodium chloride flush  10-40 mL Intracatheter Q12H  . tiotropium  18 mcg Inhalation Daily    Continuous Infusions:   Time spent: 20mns  Jnyah Brazee MD, PhD  [facility] Pager [phone] If 7PM-7AM, please contact night-coverage at [email or web address], password [facility]2/22/2018, 4:11 PM  LOS: 4 days

## 2016-04-30 NOTE — Progress Notes (Signed)
RT spoke with RN, Romelle Starcher concerning CVP set up. Per Claiborne Rigg- PT no longer has need for CVP.

## 2016-04-30 NOTE — Progress Notes (Signed)
Physical Therapy Treatment Patient Details Name: [name] MRN: [id number] DOB: [birth year] Today's Date: 04/30/2016    History of Present Illness  83 y/o woman with a complex PMHx most notable for COPD and AF who presented to the ED with PNA and ARF.    PT Comments    Pt was seen for gait and transfer training, bed mob with assistance and noted pregait sats 93% with decline to 81% after a short walk on 3L O2.  Her needs to get mobility assistance are significant, but primarily due to sats dropping with effort.  Reported to nursing and they are following up with her as she sits up in thechair.  Follow acutely for mobility and strengthening as tolerated.   Follow Up Recommendations  Home health PT;Supervision for mobility/OOB     Equipment Recommendations  None recommended by PT    Recommendations for Other Services       Precautions / Restrictions Precautions Precautions: Fall Restrictions Weight Bearing Restrictions: No    Mobility  Bed Mobility Overal bed mobility: Needs Assistance Bed Mobility: Supine to Sit     Supine to sit: Min guard;Min assist     General bed mobility comments: minor help under trunk and cues to scoot out  Transfers Overall transfer level: Needs assistance Equipment used: Rolling walker (2 wheeled) Transfers: Sit to/from Stand Sit to Stand: Min assist            Ambulation/Gait Ambulation/Gait assistance: Min assist Ambulation Distance (Feet): 18 Feet Assistive device: Rolling walker (2 wheeled);1 person hand held assist Gait Pattern/deviations: Step-through pattern;Wide base of support;Trunk flexed;Decreased stride length Gait velocity: reduced Gait velocity interpretation: Below normal speed for age/gender General Gait Details: pt was able to maneuver walker with help for O2 tank   Stairs            Wheelchair Mobility    Modified Rankin (Stroke Patients Only)       Balance     Sitting balance-Leahy Scale:  Good       Standing balance-Leahy Scale: Poor                      Cognition Arousal/Alertness: Awake/alert Behavior During Therapy: WFL for tasks assessed/performed Overall Cognitive Status: Within Functional Limits for tasks assessed                      Exercises      General Comments        Pertinent Vitals/Pain Pain Assessment: No/denies pain    Home Living                      Prior Function            PT Goals (current goals can now be found in the care plan section) Acute Rehab PT Goals Patient Stated Goal: none stated    Frequency    Min 3X/week      PT Plan Current plan remains appropriate    Co-evaluation             End of Session Equipment Utilized During Treatment: Gait belt;Oxygen Activity Tolerance: Patient limited by fatigue;Treatment limited secondary to medical complications (Comment) (O2 sats on 3L were down to 81% and recovered to 89% ) Patient left: in chair;with call bell/phone within reach;with chair alarm set Nurse Communication: Mobility status PT Visit Diagnosis: Muscle weakness (generalized) (M62.81);Unsteadiness on feet (R26.81)     Time: 4650-3546 PT Time Calculation (  min) (ACUTE ONLY): 27 min  Charges:  $Gait Training: 8-22 mins $Therapeutic Activity: 8-22 mins                    G Codes:       Ramond Dial 2016/05/18, 11:28 AM  Mee Hives, PT MS Acute Rehab Dept. Number: [name]

## 2016-04-30 NOTE — Clinical Social Work Note (Signed)
Discussed SNF with MD and patient's son, Inocente Salles. Son's first preference is [facility]. Patient is agreeable to SNF placement. Her first preference is [facility] but she does not know if they will let her return. [facility] is second preference.  [place] Scrape, [place]

## 2016-04-30 NOTE — NC FL2 (Signed)
[place] MEDICAID FL2 LEVEL OF CARE SCREENING TOOL     IDENTIFICATION  Patient Name: ANY MCNEICE Birthdate: 25-Dec-1931 Sex: female Admission Date (Current Location): 04/25/2016  [facility] and [place] Number:  Herbalist and Address:  [facility], [place] [address] [place], [place] [address]      Provider Number: [id number]  Attending Physician Name and Address:  Florencia Reasons, MD  Relative Name and Phone Number:       Current Level of Care: Hospital Recommended Level of Care: [place] Prior Approval Number:    Date Approved/Denied:   PASRR Number: [id number]  Discharge Plan: SNF    Current Diagnoses: Patient Active Problem List   Diagnosis Date Noted  . SOB (shortness of breath)   . Septic shock ([place]) 04/26/2016  . Acute respiratory failure ([place]) 04/25/2016  . Community acquired pneumonia 04/25/2016  . Severe sepsis ([place])   . Rotator cuff tear arthropathy of both shoulders 12/09/2015  . Pain in joint, upper arm 11/22/2015  . Tremors of nervous system 05/21/2015  . Complete heart block ([place]) 02/13/2015  . Acute blood loss anemia 01/28/2015  . Constipation 11/07/2014  . OA (osteoarthritis) of hip 10/31/2014  . Chest pain, atypical 06/07/2014  . Osteoporosis 01/10/2014  . Nicotine dependence in remission 06/28/2013  . GERD (gastroesophageal reflux disease) 07/19/2012  . Depression 06/06/2012  . Secondary hyperparathyroidism ([place]) 04/25/2012  . Decreased bone density 04/20/2012  . Gold stage C. COPD with frequent exacerbations 12/15/2011  . Diastolic CHF, chronic ([place]) 12/15/2011  . Routine health maintenance 08/27/2011  . History of stroke 02/21/2010  . PACEMAKER, PERMANENT 02/21/2010  . Breast cancer of upper-inner quadrant of left female breast ([place]) 02/03/2010  . Allergic rhinitis 12/31/2009  . Hypotension 09/05/2009  . Carotid stenosis 08/17/2008  . Controlled diabetes mellitus type 2 with complications ([place])  25/42/7062  . Obstructive sleep apnea 06/01/2008  . Atrial fibrillation ([place]) 06/01/2008  . [name] 06/01/2008  . Hyperlipidemia 12/30/2006  . Coronary atherosclerosis 12/30/2006    Orientation RESPIRATION BLADDER Height & Weight     Self, Time, Situation, Place  O2 (Nasal Cannula; 4L) Continent Weight: 177 lb (80.3 kg) Height:  '5\' 5"'$  (165.1 cm)  BEHAVIORAL SYMPTOMS/MOOD NEUROLOGICAL BOWEL NUTRITION STATUS      Continent  (Please see discharge summary)  AMBULATORY STATUS COMMUNICATION OF NEEDS Skin   Limited Assist Verbally Normal                       Personal Care Assistance Level of Assistance  Bathing, Feeding, Dressing Bathing Assistance: Limited assistance Feeding assistance: Independent Dressing Assistance: Limited assistance     Functional Limitations Info  Sight, Hearing, Speech Sight Info: Adequate Hearing Info: Adequate Speech Info: Adequate    SPECIAL CARE FACTORS FREQUENCY  PT (By licensed PT), OT (By licensed OT)     PT Frequency: 3x week OT Frequency: 3x week            Contractures Contractures Info: Not present    Additional Factors Info  Code Status, Allergies, Isolation Precautions Code Status Info: DNR Allergies Info: Chlestatin, Sulfur     Isolation Precautions Info: Contact precaution; MRSA     Current Medications (04/30/2016):  This is the current hospital active medication list Current Facility-Administered Medications  Medication Dose Route Frequency Provider Last Rate Last Dose  . 0.9 %  sodium chloride infusion  250 mL Intravenous PRN Luz Brazen, MD      .  acetaminophen (TYLENOL) tablet 650 mg  650 mg Oral Q6H PRN Elwin Mocha, MD   650 mg at 04/29/16 0402   Or  . acetaminophen (TYLENOL) suppository 650 mg  650 mg Rectal Q6H PRN Elwin Mocha, MD      . apixaban Arne [place]) tablet 5 mg  5 mg Oral BID Elwin Mocha, MD   5 mg at 04/30/16 1008  . atorvastatin (LIPITOR) tablet 20 mg  20 mg Oral q1800 Elwin Mocha, MD    20 mg at 04/29/16 1801  . chlorhexidine (PERIDEX) 0.12 % solution 15 mL  15 mL Mouth Rinse BID Elwin Mocha, MD   15 mL at 04/29/16 2132  . Chlorhexidine Gluconate Cloth 2 % PADS 6 each  6 each Topical Daily Collene Gobble, MD   6 each at 04/30/16 1009  . furosemide (LASIX) tablet 40 mg  40 mg Oral Daily Erick Colace, NP   40 mg at 04/30/16 1008  . guaiFENesin (MUCINEX) 12 hr tablet 600 mg  600 mg Oral BID Elwin Mocha, MD   600 mg at 04/30/16 1008  . guaiFENesin-dextromethorphan (ROBITUSSIN DM) 100-10 MG/5ML syrup 5 mL  5 mL Oral Q4H PRN Juanito Doom, MD      . insulin aspart (novoLOG) injection 0-20 Units  0-20 Units Subcutaneous TID WC Elwin Mocha, MD   3 Units at 04/30/16 1211  . levofloxacin (LEVAQUIN) tablet 750 mg  750 mg Oral Daily Raylene Miyamoto, MD   750 mg at 04/30/16 1008  . losartan (COZAAR) tablet 12.5 mg  12.5 mg Oral Daily Sherren Mocha, MD   12.5 mg at 04/30/16 1211  . magnesium hydroxide (MILK OF MAGNESIA) suspension 30 mL  30 mL Oral Daily PRN Erick Colace, NP      . mometasone-formoterol (DULERA) 200-5 MCG/ACT inhaler 2 puff  2 puff Inhalation BID Erick Colace, NP      . montelukast (SINGULAIR) tablet 10 mg  10 mg Oral QHS Collene Gobble, MD   10 mg at 04/29/16 2132  . mupirocin ointment (BACTROBAN) 2 % 1 application  1 application Nasal BID Elwin Mocha, MD   1 application at 24/58/09 1009  . ondansetron (ZOFRAN) tablet 4 mg  4 mg Oral Q6H PRN Elwin Mocha, MD      . pantoprazole (PROTONIX) EC tablet 40 mg  40 mg Oral Daily Collene Gobble, MD   40 mg at 04/30/16 1008  . polyethylene glycol (MIRALAX / GLYCOLAX) packet 17 g  17 g Oral Daily Erick Colace, NP   17 g at 04/30/16 1008  . potassium chloride 20 MEQ/15ML (10%) solution 40 mEq  40 mEq Oral Once Erick Colace, NP      . potassium chloride SA (K-DUR,KLOR-CON) CR tablet 20 mEq  20 mEq Oral Daily Erick Colace, NP   20 mEq at 04/30/16 1008  . sodium chloride flush (NS) 0.9 %  injection 10-40 mL  10-40 mL Intracatheter Q12H Collene Gobble, MD   10 mL at 04/30/16 1008  . sodium chloride flush (NS) 0.9 % injection 10-40 mL  10-40 mL Intracatheter PRN Collene Gobble, MD   20 mL at 04/30/16 0420  . sodium phosphate (FLEET) 7-19 GM/118ML enema 1 enema  1 enema Rectal Daily PRN Erick Colace, NP      . tiotropium [facility]) inhalation capsule 18 mcg  18 mcg Inhalation Daily Erick Colace, NP  Discharge Medications: Please see discharge summary for a list of discharge medications.  Relevant Imaging Results:  Relevant Lab Results:   Additional Information SSN: [id number]  Alla German, LCSW

## 2016-04-30 NOTE — Care Management Note (Signed)
Case Management Note  Patient Details  Name: [name] MRN: [id number] Date of Birth: August 09, 1931  Subjective/Objective:       Admitted with Acute Resp Failure             Action/Plan: Patient lives in [place] with her spouse; she received physical therapy at the facility 2 x a day 5 days a week; she also has oxygen at home. TCT to her son Mikeal Hawthorne with patient's permission to identify other needs; CM informed Mikeal Hawthorne of physical therapy recommendations- continuation of therapy at the [facility]. CM will continue to follow for DCP  Expected Discharge Date:    possibly 05/04/2016              Expected Discharge Plan:  [place]  Discharge planning Services  CM Consult  Status of Service:  In process, will continue to follow  Sherrilyn Rist [phone] 04/30/2016, 10:53 AM

## 2016-04-30 NOTE — Progress Notes (Signed)
  Speech Language Pathology Treatment: Dysphagia  Patient Details Name: [name] MRN: [id number] DOB: Jul 21, 1931 Today's Date: 04/30/2016 Time: 0915-0930 SLP Time Calculation (min) (ACUTE ONLY): 15 min  Assessment / Plan / Recommendation Clinical Impression  Pt seen at bedside for assessment of diet tolerance and education regarding safe swallow strategies. Pt had water (with straw) and coffee on bedside table in front of her. MBS results were reviewed with pt, including education/encouragement to take small individual sips WITHOUT STRAW. Safe swallow precautions were posted at [facility]. RN informed.    HPI HPI: [name] is a pleasant 83 y/o woman with a complex PMHx most notable for COPD and AF who presented to the ED with PNA. She he significant hypotension and was brought to the ICU for BiPAP and for peripheral pressors. concern for HCAP, aspiration. Pt observed to cough with RN. MBS completed 04/27/16 with recommendation for regular diet, thin liquids, no straws. ST in for assessment of diet tolerance and education.      SLP Plan   Discharge ST intervention : All goals met; education completed      Recommendations  Diet recommendations: Regular;Thin liquid Liquids provided via: Cup;No straw Medication Administration: Whole meds with liquid Supervision: Patient able to self feed Compensations: Slow rate;Small sips/bites;Minimize environmental distractions Postural Changes and/or Swallow Maneuvers: Seated upright 90 degrees         Oral Care Recommendations: Oral care BID Follow up Recommendations: 24 hour supervision/assistance SLP Visit Diagnosis: Dysphagia, pharyngeal phase (R13.13) Plan: All goals met;Discharge SLP treatment due to (comment)       [name] B. [name], CCC-SLP [phone]   [name] 04/30/2016, 9:46 AM

## 2016-04-30 NOTE — Progress Notes (Signed)
    Subjective:  No chest pain. Had to increase O2 to 4L to maintain adequate O2 sats. Sitting upin chair without complaints at present.   Objective:  Vital Signs in the last 24 hours: Temp:  [98 F (36.7 C)-98.6 F (37 C)] 98.2 F (36.8 C) (02/22 0737) Pulse Rate:  [68-82] 68 (02/22 0737) Resp:  [13-27] 18 (02/22 0737) BP: (93-118)/(52-91) 114/61 (02/22 0737) SpO2:  [89 %-96 %] 91 % (02/22 0737) Weight:  [176 lb 14.4 oz (80.2 kg)-177 lb (80.3 kg)] 177 lb (80.3 kg) (02/22 0500)  Intake/Output from previous day: 02/21 0701 - 02/22 0700 In: 780 [P.O.:760; I.V.:20] Out: 1225 [Urine:1225]  Physical Exam: Pt is alert and oriented, NAD HEENT: normal Neck: JVP - normal Lungs: scattered rhonchi bilaterally CV: RRR without murmur or gallop Abd: soft, NT, Positive BS, no hepatomegaly Ext: no C/C/E Skin: warm/dry no rash   Lab Results:  Recent Labs  04/28/16 0400  WBC 23.4*  HGB 11.1*  PLT 203    Recent Labs  04/29/16 0400 04/30/16 0407  NA 136 138  K 3.8 4.1  CL 105 105  CO2 23 24  GLUCOSE 136* 103*  BUN 10 10  CREATININE 0.90 0.84   No results for input(s): TROPONINI in the last 72 hours.  Invalid input(s): CK, MB  Tele: V-paced 70 bpm  Assessment/Plan:  1. Acute respiratory failure: HCAP/COPD/pulmonary edema/effusion - slowly improving 2. Acute systolic heart failure: LVEF < 30% by echo likely sepsis-related versus Takotsubo cardiomyopathy. BP has now improved running in low-normal range. Will start losartan 12.5 mg daily. No beta-blocker today as BP remains marginal.  3. Elevated troponin: suspect secondary to critical illness (demand ischemia) 4. Permanent atrial fibrillation - on apixaban - paced at 70 bpm 5. Shock: mixed cardiogenic/critical illness: resolved  Making slow progress. Will add losartan 12.5 mg daily in the setting of her cardiomyopathy. Starting at low dose with borderline BP readings.   Sherren Mocha, M.D. 04/30/2016, 9:55  AM

## 2016-04-30 NOTE — Progress Notes (Signed)
PULMONARY / CRITICAL CARE MEDICINE   Name: MADELENA MATURIN MRN: [id number] DOB: October 07, 1931    ADMISSION DATE:  04/25/2016  CHIEF COMPLAINT:  Dyspnea  HISTORY OF PRESENT ILLNESS:   For complete details, see H&P by Dr. Maudie Mercury from earlier this evening, but briefly - Ms. Silverstein is a pleasant 83 y/o woman with a complex PMHx most notable for COPD and AF who presented to the ED with PNA. She he significant hypotension and was brought to the ICU for BiPAP and for peripheral pressors. She is DNR/DNI.  Subjective / Interval Events:  Neg 600 cc, no distress SLP PT active    BP 106/75 (BP Location: Left Arm)   Pulse 73   Temp 98.2 F (36.8 C) (Oral)   Resp 18   Ht '5\' 5"'$  (1.651 m)   Wt 80.3 kg (177 lb)   SpO2 94%   BMI 29.45 kg/m  3 liters [place]     VENTILATOR SETTINGS:    INTAKE / OUTPUT:  Intake/Output Summary (Last 24 hours) at 04/30/16 1238 Last data filed at 04/30/16 5176  Gross per 24 hour  Intake              820 ml  Output              965 ml  Net             -145 ml    General: in bed awake, in good spirits Neuro: nonfocal HEENT: jvd wnl PULM: CTA CV: s1 s2 rrr HY:WVPX, mild obese, nt, nd Extremities: edema imporved    LABS:  BMET  Recent Labs Lab 04/28/16 1815 04/29/16 0400 04/30/16 0407  NA 141 136 138  K 4.0 3.8 4.1  CL 107 105 105  CO2 '24 23 24  '$ BUN '12 10 10  '$ CREATININE 0.91 0.90 0.84  GLUCOSE 169* 136* 103*    Electrolytes  Recent Labs Lab 04/28/16 1815 04/29/16 0400 04/30/16 0407  CALCIUM 8.1* 7.8* 8.2*  MG 1.9 1.8 2.0  PHOS 1.0* 1.5*  --     CBC  Recent Labs Lab 04/26/16 0716 04/27/16 0930 04/28/16 0400  WBC 28.3* 25.3* 23.4*  HGB 14.1 11.1* 11.1*  HCT 42.4 32.7* 33.2*  PLT 172 174 203    Coag's No results for input(s): APTT, INR in the last 168 hours.  Sepsis Markers  Recent Labs Lab 04/26/16 0310 04/27/16 0930 04/27/16 1511  LATICACIDVEN 3.3* 1.7 1.9    ABG  Recent Labs Lab 04/26/16 0005  PHART 7.312*   PCO2ART 30.7*  PO2ART 86.9    Liver Enzymes  Recent Labs Lab 04/25/16 1140 04/26/16 0716  AST 26 56*  ALT 12* 26  ALKPHOS 67 60  BILITOT 1.3* 1.3*  ALBUMIN 3.4* 2.8*    Cardiac Enzymes  Recent Labs Lab 04/26/16 0310 04/26/16 1524 04/27/16 0930  TROPONINI 0.09* 0.52* 0.35*    Glucose  Recent Labs Lab 04/29/16 0806 04/29/16 1130 04/29/16 1648 04/29/16 2142 04/30/16 0736 04/30/16 1144  GLUCAP 123* 139* 143* 90 106* 134*    Imaging No results found. STUDIES:  CXR 2/17 >> LLL PNA TTE 2/18 >> EF 25-30%; ? takatsubo mid and distal inferior/anterior walls apex and septum hypokinesis. PAP 56 mmHg  CULTURES: Blood 2/17 x2 >>  Flu swab 2/17 >> neg Urine 2/17: enterococcus faecalis (amp sens) Sputum >> needs to be collected  ANTIBIOTICS: Zosyn 2/17 >>2/20 Vanc 2/18 >>2/20 levaquin 2/20>>>end 23rd planned  SIGNIFICANT EVENTS: 2/17 Hypotension in setting LLL PNA, started pressors.  LINES/TUBES: PIVs Foley 2/18 >>should have been dc 2/22  ASSESSMENT / PLAN:  Resolved issues AKI Lactic acidosis NAGMA Hyperchloremia  Hypomagnesemia and Hypokalemia  Cardiogenic shock  Constipation   Active issues  Acute hypoxic respiratory failure in the setting of presumed HCAP + pulmonary edema Possible COPD Dysphagia Aspiration risk  CXR & O2 requirements improved.  Plan To RA as able When to RA then will need to check pulse ox with ambulation May need to dc on Home o2 with f.u [facility] pccm in 2 weeks- will will make this appt pcxr none new IS Out of bed  Acute diastolic and systolic HF.  ? Takatsubo; EF acutely 20-25% Demand ischemia (troponin peak .52) Atrial fib  ->cards consulted 2/19 ->off levophed Plan ARB started for chf No BB, BP imporved overall but likely not able to tolerate BB as of now Lasix tolerated well, maintain same dosages  Anemia Plan  Cont NOAC Intermittent CBC  Enterococcus Faecalis UTI Plan Levo stop date in place  2/23  DM type II Plan ssi   SLP done PT on going Updated [name] son and pt in full Appears that she cannot be dc home as unsafe for her care plan, meds, mobility etc D/w social work , and pt , she is willing to go to SNF  / rehab She is UNABLE to dc  Now, O2 needs  Will sign off, call if needed  Lavon Paganini. Titus Mould, MD, [place] Pgr: [facility] 04/30/2016 12:38 PM

## 2016-04-30 NOTE — Clinical Social Work Note (Signed)
Clinical Social Work Assessment  Patient Details  Name: [name] MRN: [id number] Date of Birth: March 19, 1931  Date of referral:  04/30/16               Reason for consult:  Discharge Planning                Permission sought to share information with:  Facility Sport and exercise psychologist, Family Supports Permission granted to share information::  Yes, Verbal Permission Granted  Name::     Fransico Him, MD  Agency::  [facility]  Relationship::  Daughter-in-law  Contact Information:  [phone]  Housing/Transportation Living arrangements for the past 2 months:  [place] of Information:  Patient, Medical Team Patient Interpreter Needed:  None Criminal Activity/Legal Involvement Pertinent to Current Situation/Hospitalization:  No - Comment as needed Significant Relationships:  Adult Children, Spouse Lives with:  Facility Resident, Spouse Do you feel safe going back to the place where you live?  Yes Need for family participation in patient care:  Yes (Comment)  Care giving concerns:  Patient is from [facility].   Social Worker assessment / plan:  CSW met with patient. No supports at bedside. CSW introduced role and explained that discharge planning would be discussed. Patient confirmed that she was admitted from [facility] where she lives with her husband and plans to return once stable for discharge. CSW informed patient of consult asking CSW to contact her daughter-in-law, Dr. Fransico Him. Patient gave verbal permission. CSW called and left a voicemail for Dr. Radford Pax. No further concerns. CSW encouraged patient to contact CSW as needed. CSW will continue to follow patient for support and facilitate discharge back to ALF once medically stable.  Employment status:  Retired Forensic scientist:  Medicare PT Recommendations:  Home with [place] / Referral to community resources:  Other (Comment Required) (Patient plans to return to  ALF)  Patient/Family's Response to care:  Patient agreeable to return to ALF. Patient's family supportive and involved in patient's care. Patient appreciated social work intervention.  Patient/Family's Understanding of and Emotional Response to Diagnosis, Current Treatment, and Prognosis:  Patient appears to have a good understanding of the reason for admission. Patient appears happy with hospital care.  Emotional Assessment Appearance:  Appears stated age Attitude/Demeanor/Rapport:  Other (Pleasant) Affect (typically observed):  Accepting, Appropriate, Calm, Pleasant Orientation:  Oriented to Self, Oriented to Place, Oriented to  Time, Oriented to Situation Alcohol / Substance use:  Never Used Psych involvement (Current and /or in the community):  No (Comment)  Discharge Needs  Concerns to be addressed:  Care Coordination Readmission within the last 30 days:  No Current discharge risk:  Dependent with Mobility Barriers to Discharge:  Continued Medical Work up   Candie Chroman, LCSW 04/30/2016, 12:10 PM

## 2016-05-01 ENCOUNTER — Inpatient Hospital Stay (HOSPITAL_COMMUNITY): Payer: Medicare Other

## 2016-05-01 LAB — CBC WITH DIFFERENTIAL/PLATELET
Basophils Absolute: 0 10*3/uL (ref 0.0–0.1)
Basophils Relative: 0 %
EOS PCT: 2 %
Eosinophils Absolute: 0.3 10*3/uL (ref 0.0–0.7)
HEMATOCRIT: 35.3 % — AB (ref 36.0–46.0)
HEMOGLOBIN: 11.6 g/dL — AB (ref 12.0–15.0)
LYMPHS ABS: 2 10*3/uL (ref 0.7–4.0)
Lymphocytes Relative: 14 %
MCH: 28.6 pg (ref 26.0–34.0)
MCHC: 32.9 g/dL (ref 30.0–36.0)
MCV: 86.9 fL (ref 78.0–100.0)
MONO ABS: 1.4 10*3/uL — AB (ref 0.1–1.0)
Monocytes Relative: 10 %
NEUTROS ABS: 10.4 10*3/uL — AB (ref 1.7–7.7)
Neutrophils Relative %: 74 %
Platelets: 250 10*3/uL (ref 150–400)
RBC: 4.06 MIL/uL (ref 3.87–5.11)
RDW: 16.3 % — AB (ref 11.5–15.5)
WBC: 14.1 10*3/uL — ABNORMAL HIGH (ref 4.0–10.5)

## 2016-05-01 LAB — GLUCOSE, CAPILLARY
GLUCOSE-CAPILLARY: 115 mg/dL — AB (ref 65–99)
Glucose-Capillary: 115 mg/dL — ABNORMAL HIGH (ref 65–99)
Glucose-Capillary: 115 mg/dL — ABNORMAL HIGH (ref 65–99)

## 2016-05-01 LAB — COMPREHENSIVE METABOLIC PANEL
ALT: 46 U/L (ref 14–54)
ANION GAP: 7 (ref 5–15)
AST: 36 U/L (ref 15–41)
Albumin: 2.1 g/dL — ABNORMAL LOW (ref 3.5–5.0)
Alkaline Phosphatase: 72 U/L (ref 38–126)
BILIRUBIN TOTAL: 0.9 mg/dL (ref 0.3–1.2)
BUN: 9 mg/dL (ref 6–20)
CO2: 26 mmol/L (ref 22–32)
Calcium: 8.3 mg/dL — ABNORMAL LOW (ref 8.9–10.3)
Chloride: 106 mmol/L (ref 101–111)
Creatinine, Ser: 0.93 mg/dL (ref 0.44–1.00)
GFR, EST NON AFRICAN AMERICAN: 56 mL/min — AB (ref >60)
Glucose, Bld: 122 mg/dL — ABNORMAL HIGH (ref 65–99)
POTASSIUM: 3.9 mmol/L (ref 3.5–5.1)
Sodium: 139 mmol/L (ref 135–145)
TOTAL PROTEIN: 5.8 g/dL — AB (ref 6.5–8.1)

## 2016-05-01 LAB — MAGNESIUM: MAGNESIUM: 1.8 mg/dL (ref 1.7–2.4)

## 2016-05-01 NOTE — Consult Note (Signed)
   [name] CM Inpatient Consult   05/01/2016  [name] 08-Feb-1932 [id number]   Patient was screened for [facility] as a benefit of the Medicare ACO Registry and her primary care provider listed as Dr. Pricilla Holm and with a history of [facility] [facility] a year ago with the [facility] Pharmacist and [facility]. MD notes reveals the patient is [name] is a 83 y.o. female with past medical history of nonobstructive [name] (by cath in 12/2002, low-risk NST in 2015), persistent AF (s/p ablation x2, known amiodarone pulmonary toxicity, on Eliquis), symptomatic bradycardia (s/p PPM placement, gen change in 2016), COPD (on 2L [place] nocturnally), [name] (s/p R iliac and SFA stenting), Stage 3 CKD, carotid stenosis, and prior CVA who presented to [facility] ED on 04/25/2016 for worsening dyspnea.  Chart review reveals the patient is a resident of [facility] facility and the patient and family are currently pursuing the skilled nursing at disposition.  A signed consent is on file.  Came by to see the patient but she was off of the unit @ 11:00 am.  For questions or referrals please contact:  Natividad Brood, RN BSN [facility] Liaison  [phone] business mobile phone Toll free office [phone]

## 2016-05-01 NOTE — Progress Notes (Signed)
PROGRESS NOTE  CASON DABNEY KZS:[id number] DOB: [birth year] DOA: 04/25/2016 PCP: Hoyt Koch, MD  HPI/Recap of past 24 hours:  Sitting up in chair, on 4liter oxygen, report productive cough, blood tinged sputum, she denies chest pain,  no fever, no edema  Assessment/Plan: Principal Problem:   Acute respiratory failure (HCC) Active Problems:   Controlled diabetes mellitus type 2 with complications ([place])   Hyperlipidemia   Obstructive sleep apnea   Coronary atherosclerosis   Atrial fibrillation (HCC)   Hypotension   Diastolic CHF, chronic (HCC)   Depression   GERD (gastroesophageal reflux disease)   Community acquired pneumonia   Severe sepsis ([place])   Septic shock (HCC)   SOB (shortness of breath)   Acute systolic CHF (congestive heart failure) (HCC)   Urinary tract infection without hematuria  Acute on chronic hypoxic respiratory failure/ from pneumonia and acute systolic heart failure, septic shock and cardiogenic shock: She presented to the ED with confusion, hypotension on 2/17, she is admitted to hospitalist service initially, due to persistent hypoxia, hypotension despite fluids resuscitation and iv abx. she is transferred to ICU, she was started on pressors with levophed and neo and bipap, she has improved, she is weaned off pressors on 2/20.  She is transferred to hospitalist service on 2/22  Sepsis/septic shock from pna,  Enterococcus uti Patient report upper respiratory symptom for a few weeks prior to coming to the hospital Blood culture negative, urine culture 20,000 enterococcus sensitive, sputum sample pending collection Leukocytosis improving, wbc 14, no fever, bp stable, still with productive cough, consider discharge on doxycycline.  She is treated with vanc/zosyn initially, abx changed to levaquin, last dose end on 2/23.   Acute systolic chf, DD22-02%, from sepsis-related versus Takotsubo cardiomyopathy per cardiology  troponin elevation, troponin  peaked at 0.52, likely demand ischemia per cardiology CXR on 2/21: 1. Cardiomegaly with mild vascular congestion and pulmonary edema,relatively similar prior study.2. Small left pleural effusion with left lung base atelectasis versus infiltrate. Repeat cxr on 2/23: 1. CHF pattern is borderline improved since 2 days ago. 2. Small left pleural effusion which could obscure atelectasis or pneumonia. No lower extremity edema on exam Currently on cozaar (started on 2/22), lasix '40mg'$  daily, urine output 1.2 liter last 24hrs  Permanent afib: s/p pulmonary vein ablation and junction ablation in the past on apixaban, rate controlled without meds, home meds atenolol held, cardiology following H/o bradycardia s/p pacemaker placement  AKI on CKD III, cr 1.29 on admission, cr improving, cr 0.8-0.9 for the last few days  Hypokalemia/hypomagnesemia: replaced, monitor  COPD , quit smoking 49yrago, on home o2 at night, on cpap at night  currently no wheezing. Continue advair (dulera inhouse)/spiriva/singulair  noninsulin dependent dm2, a1c 7, home med metfromin held, on ssi here  MRSA colonization: on contact precaution and decolonization protocol  Code Status: DNR  Family Communication: patient   Disposition Plan:  need cardiology clearance prior to discharge  Need to remove picc line prior to discharge Need to continue home o2 at discharge  SNF  At discharge, likely on 2/24   Consultants:  [place], transfer to [place]17  [place]  cardiology  Procedures:  bipap  picc line placement on 2/18  Antibiotics:  vanc on 2/17 and 2/18  Zosyn from 2/17 to 2/20  levaquin from 2/20   Objective: BP 94/60 (BP Location: Left Arm)   Pulse 72   Temp 98.4 F (36.9 C) (Oral)  Resp 18   Ht '5\' 5"'$  (1.651 m)   Wt 79.7 kg (175 lb 9.6 oz)   SpO2 97%   BMI 29.22 kg/m   Intake/Output Summary (Last 24 hours) at 05/01/16 1433 Last data filed at 05/01/16 1412  Gross  per 24 hour  Intake             1440 ml  Output             2051 ml  Net             -611 ml   Filed Weights   04/29/16 1634 04/30/16 0500 05/01/16 0432  Weight: 80.2 kg (176 lb 14.4 oz) 80.3 kg (177 lb) 79.7 kg (175 lb 9.6 oz)    Exam:   General:  NAD  Cardiovascular: RRR  Respiratory: diminished at basis, mild scattered rhonchi, no wheezing  Abdomen: Soft/ND/NT, positive BS  Musculoskeletal: No Edema  Neuro: aaox3  Data Reviewed: Basic Metabolic Panel:  Recent Labs Lab 04/28/16 0400 04/28/16 1815 04/29/16 0400 04/30/16 0407 05/01/16 0439  NA 138 141 136 138 139  K 2.7* 4.0 3.8 4.1 3.9  CL 106 107 105 105 106  CO2 '22 24 23 24 26  '$ GLUCOSE 157* 169* 136* 103* 122*  BUN '15 12 10 10 9  '$ CREATININE 1.03* 0.91 0.90 0.84 0.93  CALCIUM 7.5* 8.1* 7.8* 8.2* 8.3*  MG 1.6* 1.9 1.8 2.0 1.8  PHOS  --  1.0* 1.5*  --   --    Liver Function Tests:  Recent Labs Lab 04/25/16 1140 04/26/16 0716 05/01/16 0439  AST 26 56* 36  ALT 12* 26 46  ALKPHOS 67 60 72  BILITOT 1.3* 1.3* 0.9  PROT 7.1 6.4* 5.8*  ALBUMIN 3.4* 2.8* 2.1*   No results for input(s): LIPASE, AMYLASE in the last 168 hours. No results for input(s): AMMONIA in the last 168 hours. CBC:  Recent Labs Lab 04/25/16 1140 04/26/16 0716 04/27/16 0930 04/28/16 0400 05/01/16 0439  WBC 16.4* 28.3* 25.3* 23.4* 14.1*  NEUTROABS 14.6*  --   --   --  10.4*  HGB 13.4 14.1 11.1* 11.1* 11.6*  HCT 41.2 42.4 32.7* 33.2* 35.3*  MCV 88.8 90.0 86.7 85.3 86.9  PLT 209 172 174 203 250   Cardiac Enzymes:    Recent Labs Lab 04/26/16 0034 04/26/16 0310 04/26/16 1524 04/27/16 0930  CKTOTAL 193  --   --   --   CKMB 3.1  --   --   --   TROPONINI 0.04* 0.09* 0.52* 0.35*   BNP (last 3 results) No results for input(s): BNP in the last 8760 hours.  ProBNP (last 3 results) No results for input(s): PROBNP in the last 8760 hours.  CBG:  Recent Labs Lab 04/30/16 1144 04/30/16 1642 04/30/16 2139 05/01/16 0729  05/01/16 1133  GLUCAP 134* 111* 96 115* 115*    Recent Results (from the past 240 hour(s))  Blood Culture (routine x 2)     Status: None   Collection Time: 04/25/16 11:40 AM  Result Value Ref Range Status   Specimen Description BLOOD RIGHT ANTECUBITAL  Final   Special Requests BOTTLES DRAWN AEROBIC AND ANAEROBIC  5CC  Final   Culture NO GROWTH 5 DAYS  Final   Report Status 04/30/2016 FINAL  Final  Blood Culture (routine x 2)     Status: None   Collection Time: 04/25/16 11:58 AM  Result Value Ref Range Status   Specimen Description BLOOD RIGHT FOREARM  Final  Special Requests BOTTLES DRAWN AEROBIC AND ANAEROBIC 5CC  Final   Culture NO GROWTH 5 DAYS  Final   Report Status 04/30/2016 FINAL  Final  Urine culture     Status: Abnormal   Collection Time: 04/25/16 12:16 PM  Result Value Ref Range Status   Specimen Description URINE, RANDOM  Final   Special Requests Immunocompromised  Final   Culture 20,000 COLONIES/mL ENTEROCOCCUS FAECALIS (A)  Final   Report Status 04/27/2016 FINAL  Final   Organism ID, Bacteria ENTEROCOCCUS FAECALIS (A)  Final      Susceptibility   Enterococcus faecalis - MIC*    AMPICILLIN <=2 SENSITIVE Sensitive     LEVOFLOXACIN 1 SENSITIVE Sensitive     NITROFURANTOIN <=16 SENSITIVE Sensitive     VANCOMYCIN 1 SENSITIVE Sensitive     * 20,000 COLONIES/mL ENTEROCOCCUS FAECALIS  MRSA PCR Screening     Status: Abnormal   Collection Time: 04/25/16  7:44 PM  Result Value Ref Range Status   MRSA by PCR POSITIVE (A) NEGATIVE Final    Comment:        The GeneXpert MRSA Assay (FDA approved for NASAL specimens only), is one component of a comprehensive MRSA colonization surveillance program. It is not intended to diagnose MRSA infection nor to guide or monitor treatment for MRSA infections. RESULT CALLED TO, READ BACK BY AND VERIFIED WITH: MCKIBBEN,K RN [id number] 04/25/16 MITCHELL,L      Studies: Dg Chest 2 View  Result Date: 05/01/2016 CLINICAL DATA:  Shortness  of breath and pneumonia. EXAM: CHEST  2 VIEW COMPARISON:  Two days ago FINDINGS: Mildly improved aeration. There is still generalized hazy opacity with discrete pleural effusion on the left. Chronic cardiomegaly. Unremarkable positioning of right upper extremity PICC in dual-chamber pacer leads. IMPRESSION: 1. CHF pattern is borderline improved since 2 days ago. 2. Small left pleural effusion which could obscure atelectasis or pneumonia. Electronically Signed   By: Monte Fantasia M.D.   On: 05/01/2016 11:16    Scheduled Meds: . apixaban  5 mg Oral BID  . atorvastatin  20 mg Oral q1800  . chlorhexidine  15 mL Mouth Rinse BID  . Chlorhexidine Gluconate Cloth  6 each Topical Daily  . furosemide  40 mg Oral Daily  . guaiFENesin  600 mg Oral BID  . insulin aspart  0-20 Units Subcutaneous TID WC  . losartan  12.5 mg Oral Daily  . mometasone-formoterol  2 puff Inhalation BID  . montelukast  10 mg Oral QHS  . pantoprazole  40 mg Oral Daily  . polyethylene glycol  17 g Oral Daily  . potassium chloride  40 mEq Oral Once  . potassium chloride  20 mEq Oral Daily  . sodium chloride flush  10-40 mL Intracatheter Q12H  . tiotropium  18 mcg Inhalation Daily    Continuous Infusions:   Time spent: 77mns  Gissele Narducci MD, PhD  [facility] Pager [phone] If 7PM-7AM, please contact night-coverage at [email or web address], password [email or web address] [address]2/23/2018, 2:33 PM  LOS: 5 days

## 2016-05-01 NOTE — Progress Notes (Signed)
Patient Name: [name] Date of Encounter: 05/01/2016  Primary Cardiologist: Dr. Caryl Comes PAD Followed by  Dr. Tyrell Antonio Problem List     Principal Problem:   Acute respiratory failure [facility]) Active Problems:   Controlled diabetes mellitus type 2 with complications ([place])   Hyperlipidemia   Obstructive sleep apnea   Coronary atherosclerosis   Atrial fibrillation (HCC)   Hypotension   Diastolic CHF, chronic (HCC)   Depression   GERD (gastroesophageal reflux disease)   Community acquired pneumonia   Severe sepsis (HCC)   Septic shock (HCC)   SOB (shortness of breath)   Acute systolic CHF (congestive heart failure) ([place])   Urinary tract infection without hematuria     Subjective   Feeling okay. Just weak all over . No chest pain or SOB.  Inpatient Medications    Scheduled Meds: . apixaban  5 mg Oral BID  . atorvastatin  20 mg Oral q1800  . chlorhexidine  15 mL Mouth Rinse BID  . Chlorhexidine Gluconate Cloth  6 each Topical Daily  . furosemide  40 mg Oral Daily  . guaiFENesin  600 mg Oral BID  . insulin aspart  0-20 Units Subcutaneous TID WC  . losartan  12.5 mg Oral Daily  . mometasone-formoterol  2 puff Inhalation BID  . montelukast  10 mg Oral QHS  . pantoprazole  40 mg Oral Daily  . polyethylene glycol  17 g Oral Daily  . potassium chloride  40 mEq Oral Once  . potassium chloride  20 mEq Oral Daily  . sodium chloride flush  10-40 mL Intracatheter Q12H  . tiotropium  18 mcg Inhalation Daily   Continuous Infusions:  PRN Meds: sodium chloride, acetaminophen **OR** acetaminophen, guaiFENesin-dextromethorphan, magnesium hydroxide, ondansetron **OR** [DISCONTINUED] ondansetron (ZOFRAN) IV, sodium chloride flush, sodium phosphate   Vital Signs    Vitals:   04/30/16 2100 05/01/16 0432 05/01/16 0702 05/01/16 0822  BP: 129/72 97/62  (!) 104/53  Pulse: 89 70  70  Resp: _0 Temp: 97.5 F (36.4 C) 98.4 F (36.9 C)    TempSrc: Oral Oral      SpO2:  93% 100% 100%  Weight:  175 lb 9.6 oz (79.7 kg)    Height:        Intake/Output Summary (Last 24 hours) at 05/01/16 1043 Last data filed at 05/01/16 1000  Gross per 24 hour  Intake             1200 ml  Output             1851 ml  Net             -651 ml   Filed Weights   04/29/16 1634 04/30/16 0500 05/01/16 0432  Weight: 176 lb 14.4 oz (80.2 kg) 177 lb (80.3 kg) 175 lb 9.6 oz (79.7 kg)    Physical Exam    GEN: chronically ill appearing HEENT: Grossly normal.  Neck: Supple, no JVD, carotid bruits, or masses. Cardiac: RRR, no murmurs, rubs, or gallops. No clubbing, cyanosis, edema.  Radials/DP/PT 2+ and equal bilaterally.  Respiratory:  Respirations regular and unlabored, mild crackles at bases GI: Soft, nontender, nondistended, BS + x 4. MS: no deformity or atrophy. Skin: warm and dry, no rash. Neuro:  Strength and sensation are intact. Psych: AAOx3.  Normal affect.  Labs    CBC  Recent Labs  05/01/16 0439  WBC 14.1*  NEUTROABS 10.4*  HGB 11.6*  HCT 35.3*  MCV  86.9  PLT 573   Basic Metabolic Panel  Recent Labs  04/28/16 1815 04/29/16 0400 04/30/16 0407 05/01/16 0439  NA 141 136 138 139  K 4.0 3.8 4.1 3.9  CL 107 105 105 106  CO2 _0 GLUCOSE 169* 136* 103* 122*  BUN _1 CREATININE 0.91 0.90 0.84 0.93  CALCIUM 8.1* 7.8* 8.2* 8.3*  MG 1.9 1.8 2.0 1.8  PHOS 1.0* 1.5*  --   --    Liver Function Tests  Recent Labs  05/01/16 0439  AST 36  ALT 46  ALKPHOS 72  BILITOT 0.9  PROT 5.8*  ALBUMIN 2.1*   No results for input(s): LIPASE, AMYLASE in the last 72 hours. Cardiac Enzymes No results for input(s): CKTOTAL, CKMB, CKMBINDEX, TROPONINI in the last 72 hours. BNP Invalid input(s): POCBNP D-Dimer No results for input(s): DDIMER in the last 72 hours. Hemoglobin A1C No results for input(s): HGBA1C in the last 72 hours. Fasting Lipid Panel No results for input(s): CHOL, HDL, LDLCALC, TRIG, CHOLHDL, LDLDIRECT in the last 72  hours. Thyroid Function Tests No results for input(s): TSH, T4TOTAL, T3FREE, THYROIDAB in the last 72 hours.  Invalid input(s): FREET3  Telemetry    V Paced  - Personally Reviewed  ECG    V paced- Personally Reviewed  Radiology    No results found.  Cardiac Studies   Echocardiogram: 04/27/2016 Study Conclusions - Left ventricle: ? Takatsubo mid and distal inferior/anterior walls apex and septum hypokinetic. Wall thickness was increased in a pattern of mild LVH. Systolic function was severely reduced. The estimated ejection fraction was in the range of 25% to 30%. Doppler parameters are consistent with both elevated ventricular end-diastolic filling pressure and elevated left atrial filling pressure. - Mitral valve: Severely calcified annulus. Moderately thickened, moderately calcified leaflets . There was mild regurgitation. Valve area by continuity equation (using LVOT flow): 2.48 cm^2. - Left atrium: The atrium was moderately dilated. - Atrial septum: No defect or patent foramen ovale was identified. - Pulmonary arteries: PA peak pressure: 56 mm Hg (S). - Impressions: If high suspicion for SBE consider TEE given degree of MAC and pacing wires. Impressions: - If high suspicion for SBE consider TEE given degree of MAC and pacing wires.  Patient Profile     ERA PARR is a 83 y.o. female with past medical history of nonobstructive [name] (by cath in 12/2002, low-risk NST in 2015), persistent AF (s/p ablation x2, known amiodarone pulmonary toxicity, on Eliquis), symptomatic bradycardia (s/p PPM placement, gen change in 2016), COPD (on 2L [place] nocturnally), [name] (s/p R iliac and SFA stenting), Stage 3 CKD, carotid stenosis, and prior CVA who presented to [facility] ED on 04/25/2016 for worsening dyspnea.   Assessment & Plan    Acute respiratory failure: HCAP/COPD/pulmonary edema/effusion - on Vancomycin and Zosyn.  New Systolic CHF - EF previously  55-60% in 09/2013. Admitted for Sepsis secondary to HCAP and repeat echo shows questionable takotsubo cardiomyopathy with an EF of 25-30% with mid and distal inferior/anterior walls and hypokinetic apex and septum - Ultimately will benefit from both beta-blocker/ACE or ARB as tolerated. BP is improving. Now off all pressors. BP still soft  Elevated Troponin  - cyclic troponin values have been 0.04, 0.09, 0.52, and 0.35.  - echo shows reduced EF of 25-30% concerning for takotsubo cardiomyopathy. - suspect secondary to critical illness (demand ischemia). No plans for further ischemic w/u at this time  Persistent AF - s/p ablation  x2, history of pulmonary toxicity with Amiodarone - This patients CHA2DS2-VASc Score and unadjusted Ischemic Stroke Rate (% per year) is equal to 12.2 % stroke rate/year from a score of 9 (CHF, HTN, DM, PAD, Female, Age (2), CVA (2)). Continue Eliquis 38m BID.   Symptomatic bradycardia - s/p PPM placement, gen change in 2016. - followed by Dr. KCaryl Comes Stage 3 CKD - baseline 1.1 - 1.2 - stable at 0.93 today.   Signed, KAngelena Form PA-C  05/01/2016, 10:43 AM   Patient seen, examined. Available data reviewed. Agree with findings, assessment, and plan as outlined by KAngelena Form PA-C. Exam reveals an alert, oriented, elderly woman in NAD. JVP mildly elevated. Lungs with diminished BS in bases, heart RRR without murmur, extremities without edema. Vitals and lab data reviewed. Would continue low-dose losartan and daily furosemide at current doses. BP is too soft to add a beta-blocker or increase losartan at this point. She is stable from a cardiac perspective. Will need a FU echo in a few months. Otherwise issues as outlined above. Seems to be improving slowly each day. Will continue to follow while she is hospitalized.  MSherren Mocha M.D. 05/01/2016 1:23 PM

## 2016-05-01 NOTE — Progress Notes (Signed)
Pt sitting up in recliner for lunch. Pt son at [place]

## 2016-05-01 NOTE — Progress Notes (Signed)
Sputum culture container given to pt and explained  [name]

## 2016-05-02 DIAGNOSIS — I482 Chronic atrial fibrillation: Secondary | ICD-10-CM

## 2016-05-02 DIAGNOSIS — Z950 Presence of cardiac pacemaker: Secondary | ICD-10-CM

## 2016-05-02 LAB — GLUCOSE, CAPILLARY
GLUCOSE-CAPILLARY: 117 mg/dL — AB (ref 65–99)
GLUCOSE-CAPILLARY: 145 mg/dL — AB (ref 65–99)
Glucose-Capillary: 142 mg/dL — ABNORMAL HIGH (ref 65–99)
Glucose-Capillary: 91 mg/dL (ref 65–99)

## 2016-05-02 LAB — BASIC METABOLIC PANEL
ANION GAP: 10 (ref 5–15)
BUN: 11 mg/dL (ref 6–20)
CALCIUM: 8.8 mg/dL — AB (ref 8.9–10.3)
CHLORIDE: 103 mmol/L (ref 101–111)
CO2: 27 mmol/L (ref 22–32)
Creatinine, Ser: 0.92 mg/dL (ref 0.44–1.00)
GFR calc non Af Amer: 56 mL/min — ABNORMAL LOW (ref >60)
GLUCOSE: 130 mg/dL — AB (ref 65–99)
POTASSIUM: 3.7 mmol/L (ref 3.5–5.1)
Sodium: 140 mmol/L (ref 135–145)

## 2016-05-02 LAB — CBC WITH DIFFERENTIAL/PLATELET
BASOS PCT: 1 %
Basophils Absolute: 0.1 10*3/uL (ref 0.0–0.1)
EOS PCT: 3 %
Eosinophils Absolute: 0.4 10*3/uL (ref 0.0–0.7)
HEMATOCRIT: 37.8 % (ref 36.0–46.0)
HEMOGLOBIN: 12.4 g/dL (ref 12.0–15.0)
Lymphocytes Relative: 18 %
Lymphs Abs: 2.2 10*3/uL (ref 0.7–4.0)
MCH: 28.5 pg (ref 26.0–34.0)
MCHC: 32.8 g/dL (ref 30.0–36.0)
MCV: 86.9 fL (ref 78.0–100.0)
MONOS PCT: 10 %
Monocytes Absolute: 1.2 10*3/uL — ABNORMAL HIGH (ref 0.1–1.0)
NEUTROS PCT: 68 %
Neutro Abs: 8.1 10*3/uL — ABNORMAL HIGH (ref 1.7–7.7)
Platelets: 284 10*3/uL (ref 150–400)
RBC: 4.35 MIL/uL (ref 3.87–5.11)
RDW: 16 % — ABNORMAL HIGH (ref 11.5–15.5)
WBC: 12 10*3/uL — AB (ref 4.0–10.5)

## 2016-05-02 LAB — MAGNESIUM: Magnesium: 1.8 mg/dL (ref 1.7–2.4)

## 2016-05-02 MED ORDER — ALTEPLASE 2 MG IJ SOLR
2.0000 mg | Freq: Once | INTRAMUSCULAR | Status: AC
  Administered 2016-05-02: 2 mg

## 2016-05-02 MED ORDER — DOXYCYCLINE HYCLATE 100 MG PO TABS
100.0000 mg | ORAL_TABLET | Freq: Two times a day (BID) | ORAL | Status: DC
  Administered 2016-05-02 – 2016-05-03 (×2): 100 mg via ORAL
  Filled 2016-05-02 (×2): qty 1

## 2016-05-02 NOTE — Progress Notes (Signed)
PROGRESS NOTE  [name] MCN:[id number] DOB: 05-14-1931 DOA: 04/25/2016 PCP: Hoyt Koch, MD  HPI/Recap of past 24 hours:  Sitting up in chair, on 3liter oxygen, report less productive cough,   she denies chest pain,  no fever, no edema Over all feeling better  Assessment/Plan: Principal Problem:   Acute respiratory failure (HCC) Active Problems:   Controlled diabetes mellitus type 2 with complications ([place])   Hyperlipidemia   Obstructive sleep apnea   Coronary atherosclerosis   Atrial fibrillation (HCC)   Hypotension   Diastolic CHF, chronic (HCC)   Depression   GERD (gastroesophageal reflux disease)   Community acquired pneumonia   Severe sepsis ([place])   Septic shock (HCC)   SOB (shortness of breath)   Acute systolic CHF (congestive heart failure) (HCC)   Urinary tract infection without hematuria  Acute on chronic hypoxic respiratory failure/ from pneumonia and acute systolic heart failure, septic shock and cardiogenic shock: She presented to the ED with confusion, hypotension on 2/17, she is admitted to hospitalist service initially, due to persistent hypoxia, hypotension despite fluids resuscitation and iv abx. she is transferred to ICU, she was started on pressors with levophed and neo and bipap, she has improved, she is weaned off pressors on 2/20.  She is transferred to hospitalist service on 2/22  Sepsis/septic shock from pna,  Enterococcus uti Patient report upper respiratory symptom for a few weeks prior to coming to the hospital Blood culture negative, urine culture 20,000 enterococcus sensitive, sputum sample pending collection Leukocytosis improving, wbc 12, no fever, bp stable, still with productive cough, She is treated with vanc/zosyn initially, abx changed to levaquin, last dose end on 2/23. will treat with doxycycline for additional three days   Acute systolic chf, [id number]%, from sepsis-related versus Takotsubo cardiomyopathy per cardiology  troponin elevation, troponin peaked at 0.52, likely demand ischemia per cardiology CXR on 2/21: 1. Cardiomegaly with mild vascular congestion and pulmonary edema,relatively similar prior study.2. Small left pleural effusion with left lung base atelectasis versus infiltrate. Repeat cxr on 2/23: 1. CHF pattern is borderline improved since 2 days ago. 2. Small left pleural effusion which could obscure atelectasis or pneumonia. No lower extremity edema on exam Currently on cozaar12.'5mg'$  (started on 2/22), lasix '40mg'$  daily, urine output 1.2 liter last 24hrs  Permanent afib: s/p pulmonary vein ablation and junction ablation in the past on apixaban, rate controlled without meds, home meds atenolol held, cardiology following H/o bradycardia s/p pacemaker placement  AKI on CKD III, cr 1.29 on admission, cr improving, cr 0.8-0.9 for the last few days  Hypokalemia/hypomagnesemia: replaced, monitor  COPD , quit smoking 83yrago, on home o2 at night, on cpap at night  currently no wheezing. Continue advair (dulera inhouse)/spiriva/singulair  noninsulin dependent dm2, a1c 7, home med metfromin held, on ssi here Resume metformin at discharge  MRSA colonization: on contact precaution and decolonization protocol  Code Status: DNR  Family Communication: patient at bedside, son over the phone on 2/24  Disposition Plan:  I have discussed with cardiology on 2/24 who state patient will on current medication for cardiac issues, patient is cleared to discharge from cardiac stand point  Need to remove picc line prior to discharge Need to continue home o2 at discharge  SNF  At discharge, likely on 2/25   Consultants:  [facility]mit, transfer to icu on 2/17  [facility]  Pcc[facility], signed off  cardiology  Procedures:  bipap  picc line placement on 2/18  Antibiotics:  vanc on 2/17 and 2/18  Zosyn from 2/17 to 2/20  levaquin from 2/20 to 2/23  Doxycycline from 2/24-   Objective: BP (!)  103/53 (BP Location: Left Arm)   Pulse 76   Temp 97.9 F (36.6 C) (Oral)   Resp 18   Ht '5\' 5"'$  (1.651 m)   Wt 78.2 kg (172 lb 8 oz) Comment: c scale  SpO2 99%   BMI 28.71 kg/m   Intake/Output Summary (Last 24 hours) at 05/02/16 1313 Last data filed at 05/02/16 1158  Gross per 24 hour  Intake             1067 ml  Output             1650 ml  Net             -583 ml   Filed Weights   04/30/16 0500 05/01/16 0432 05/02/16 0652  Weight: 80.3 kg (177 lb) 79.7 kg (175 lb 9.6 oz) 78.2 kg (172 lb 8 oz)    Exam:   General:  NAD  Cardiovascular: RRR  Respiratory: diminished at basis, mild scattered rhonchi, no wheezing  Abdomen: Soft/ND/NT, positive BS  Musculoskeletal: No Edema  Neuro: aaox3  Data Reviewed: Basic Metabolic Panel:  Recent Labs Lab 04/28/16 1815 04/29/16 0400 04/30/16 0407 05/01/16 0439 05/02/16 0451  NA 141 136 138 139 140  K 4.0 3.8 4.1 3.9 3.7  CL 107 105 105 106 103  CO2 '24 23 24 26 27  '$ GLUCOSE 169* 136* 103* 122* 130*  BUN '12 10 10 9 11  '$ CREATININE 0.91 0.90 0.84 0.93 0.92  CALCIUM 8.1* 7.8* 8.2* 8.3* 8.8*  MG 1.9 1.8 2.0 1.8 1.8  PHOS 1.0* 1.5*  --   --   --    Liver Function Tests:  Recent Labs Lab 04/26/16 0716 05/01/16 0439  AST 56* 36  ALT 26 46  ALKPHOS 60 72  BILITOT 1.3* 0.9  PROT 6.4* 5.8*  ALBUMIN 2.8* 2.1*   No results for input(s): LIPASE, AMYLASE in the last 168 hours. No results for input(s): AMMONIA in the last 168 hours. CBC:  Recent Labs Lab 04/26/16 0716 04/27/16 0930 04/28/16 0400 05/01/16 0439 05/02/16 0451  WBC 28.3* 25.3* 23.4* 14.1* 12.0*  NEUTROABS  --   --   --  10.4* 8.1*  HGB 14.1 11.1* 11.1* 11.6* 12.4  HCT 42.4 32.7* 33.2* 35.3* 37.8  MCV 90.0 86.7 85.3 86.9 86.9  PLT 172 174 203 250 284   Cardiac Enzymes:    Recent Labs Lab 04/26/16 0034 04/26/16 0310 04/26/16 1524 04/27/16 0930  CKTOTAL 193  --   --   --   CKMB 3.1  --   --   --   TROPONINI 0.04* 0.09* 0.52* 0.35*   BNP (last 3  results) No results for input(s): BNP in the last 8760 hours.  ProBNP (last 3 results) No results for input(s): PROBNP in the last 8760 hours.  CBG:  Recent Labs Lab 05/01/16 0729 05/01/16 1133 05/01/16 1629 05/02/16 0744 05/02/16 1130  GLUCAP 115* 115* 115* 117* 142*    Recent Results (from the past 240 hour(s))  Blood Culture (routine x 2)     Status: None   Collection Time: 04/25/16 11:40 AM  Result Value Ref Range Status   Specimen Description BLOOD RIGHT ANTECUBITAL  Final   Special Requests BOTTLES DRAWN AEROBIC AND ANAEROBIC  5CC  Final   Culture NO GROWTH 5 DAYS  Final   Report Status  04/30/2016 FINAL  Final  Blood Culture (routine x 2)     Status: None   Collection Time: 04/25/16 11:58 AM  Result Value Ref Range Status   Specimen Description BLOOD RIGHT FOREARM  Final   Special Requests BOTTLES DRAWN AEROBIC AND ANAEROBIC 5CC  Final   Culture NO GROWTH 5 DAYS  Final   Report Status 04/30/2016 FINAL  Final  Urine culture     Status: Abnormal   Collection Time: 04/25/16 12:16 PM  Result Value Ref Range Status   Specimen Description URINE, RANDOM  Final   Special Requests Immunocompromised  Final   Culture 20,000 COLONIES/mL ENTEROCOCCUS FAECALIS (A)  Final   Report Status 04/27/2016 FINAL  Final   Organism ID, Bacteria ENTEROCOCCUS FAECALIS (A)  Final      Susceptibility   Enterococcus faecalis - MIC*    AMPICILLIN <=2 SENSITIVE Sensitive     LEVOFLOXACIN 1 SENSITIVE Sensitive     NITROFURANTOIN <=16 SENSITIVE Sensitive     VANCOMYCIN 1 SENSITIVE Sensitive     * 20,000 COLONIES/mL ENTEROCOCCUS FAECALIS  MRSA PCR Screening     Status: Abnormal   Collection Time: 04/25/16  7:44 PM  Result Value Ref Range Status   MRSA by PCR POSITIVE (A) NEGATIVE Final    Comment:        The GeneXpert MRSA Assay (FDA approved for NASAL specimens only), is one component of a comprehensive MRSA colonization surveillance program. It is not intended to diagnose  MRSA infection nor to guide or monitor treatment for MRSA infections. RESULT CALLED TO, READ BACK BY AND VERIFIED WITH: MCKIBBEN,K RN [id number] 04/25/16 MITCHELL,L      Studies: No results found.  Scheduled Meds: . apixaban  5 mg Oral BID  . atorvastatin  20 mg Oral q1800  . chlorhexidine  15 mL Mouth Rinse BID  . Chlorhexidine Gluconate Cloth  6 each Topical Daily  . furosemide  40 mg Oral Daily  . guaiFENesin  600 mg Oral BID  . insulin aspart  0-20 Units Subcutaneous TID WC  . losartan  12.5 mg Oral Daily  . mometasone-formoterol  2 puff Inhalation BID  . montelukast  10 mg Oral QHS  . pantoprazole  40 mg Oral Daily  . polyethylene glycol  17 g Oral Daily  . potassium chloride  40 mEq Oral Once  . potassium chloride  20 mEq Oral Daily  . sodium chloride flush  10-40 mL Intracatheter Q12H  . tiotropium  18 mcg Inhalation Daily    Continuous Infusions:   Time spent: 90mns  Alayzia Pavlock MD, PhD  [facility] Pager [phone] If 7PM-7AM, please contact night-coverage at [email or web address], password [facility]2/24/2018, 1:13 PM  LOS: 6 days

## 2016-05-02 NOTE — Progress Notes (Signed)
Subjective:     Still with some SOB this AM Objective:   Temp:  [97.9 F (36.6 C)-98.2 F (36.8 C)] 97.9 F (36.6 C) (02/24 1130) Pulse Rate:  [70-76] 76 (02/24 1130) Resp:  [18-20] 18 (02/24 1130) BP: (96-107)/(48-56) 103/53 (02/24 1130) SpO2:  [95 %-99 %] 99 % (02/24 1130) Weight:  [172 lb 8 oz (78.2 kg)] 172 lb 8 oz (78.2 kg) (02/24 0652) Last BM Date: 04/30/16  Filed Weights   04/30/16 0500 05/01/16 0432 05/02/16 1443  Weight: 177 lb (80.3 kg) 175 lb 9.6 oz (79.7 kg) 172 lb 8 oz (78.2 kg)    Intake/Output Summary (Last 24 hours) at 05/02/16 1208 Last data filed at 05/02/16 1158  Gross per 24 hour  Intake             1067 ml  Output             1650 ml  Net             -583 ml    Telemetry: A-sensed V-paced  Exam:  General: NAD  HEENT: sclera clear, throat clear  Resp: mild bilateral coarse breath sounds  Cardiac: RRR, no m/r/g, no jvd  GI: abdomen soft, NT, ND  MSK: no LE edema  Neuro: no focal deficits  Psych: appropriate affect  Lab Results:  Basic Metabolic Panel:  Recent Labs Lab 04/30/16 0407 05/01/16 0439 05/02/16 0451  NA 138 139 140  K 4.1 3.9 3.7  CL 105 106 103  CO2 '24 26 27  '$ GLUCOSE 103* 122* 130*  BUN '10 9 11  '$ CREATININE 0.84 0.93 0.92  CALCIUM 8.2* 8.3* 8.8*  MG 2.0 1.8 1.8    Liver Function Tests:  Recent Labs Lab 04/26/16 0716 05/01/16 0439  AST 56* 36  ALT 26 46  ALKPHOS 60 72  BILITOT 1.3* 0.9  PROT 6.4* 5.8*  ALBUMIN 2.8* 2.1*    CBC:  Recent Labs Lab 04/28/16 0400 05/01/16 0439 05/02/16 0451  WBC 23.4* 14.1* 12.0*  HGB 11.1* 11.6* 12.4  HCT 33.2* 35.3* 37.8  MCV 85.3 86.9 86.9  PLT 203 250 284    Cardiac Enzymes:  Recent Labs Lab 04/26/16 0034 04/26/16 0310 04/26/16 1524 04/27/16 0930  CKTOTAL 193  --   --   --   CKMB 3.1  --   --   --   TROPONINI 0.04* 0.09* 0.52* 0.35*    BNP: No results for input(s): PROBNP in the last 8760 hours.  Coagulation: No results for input(s):  INR in the last 168 hours.  ECG:   Medications:   Scheduled Medications: . apixaban  5 mg Oral BID  . atorvastatin  20 mg Oral q1800  . chlorhexidine  15 mL Mouth Rinse BID  . Chlorhexidine Gluconate Cloth  6 each Topical Daily  . furosemide  40 mg Oral Daily  . guaiFENesin  600 mg Oral BID  . insulin aspart  0-20 Units Subcutaneous TID WC  . losartan  12.5 mg Oral Daily  . mometasone-formoterol  2 puff Inhalation BID  . montelukast  10 mg Oral QHS  . pantoprazole  40 mg Oral Daily  . polyethylene glycol  17 g Oral Daily  . potassium chloride  40 mEq Oral Once  . potassium chloride  20 mEq Oral Daily  . sodium chloride flush  10-40 mL Intracatheter Q12H  . tiotropium  18 mcg Inhalation Daily     Infusions:   PRN Medications:  sodium chloride,  acetaminophen **OR** acetaminophen, guaiFENesin-dextromethorphan, magnesium hydroxide, ondansetron **OR** [DISCONTINUED] ondansetron (ZOFRAN) IV, sodium chloride flush, sodium phosphate      Assessment/Plan    1. Acute respiratory failure - multifactorial with HCAP, COPD, and acute heart failure - abx per primary team  2. Acute systolic HF - 03/2749 echo LVEF 25-30%, down from 55-60% in 09/2013 - concern for possible acute stress induced CM - current therapy with losartan 12.'5mg'$  daily. medical therapy limited by soft blood pressures, no changes at this time - no plans for ischemic testing per prior cardiology rounding note - continue current medical therapy.   3. Afib - history of prior ablations - history of amio toxicity - continue eliquis. Currently not requiring av nodal agents.   4. DNR status   Carlyle Dolly, M.D.

## 2016-05-03 DIAGNOSIS — N390 Urinary tract infection, site not specified: Secondary | ICD-10-CM | POA: Diagnosis not present

## 2016-05-03 DIAGNOSIS — I2510 Atherosclerotic heart disease of native coronary artery without angina pectoris: Secondary | ICD-10-CM | POA: Diagnosis not present

## 2016-05-03 DIAGNOSIS — J9601 Acute respiratory failure with hypoxia: Secondary | ICD-10-CM | POA: Diagnosis not present

## 2016-05-03 DIAGNOSIS — R2689 Other abnormalities of gait and mobility: Secondary | ICD-10-CM | POA: Diagnosis not present

## 2016-05-03 DIAGNOSIS — E118 Type 2 diabetes mellitus with unspecified complications: Secondary | ICD-10-CM | POA: Diagnosis not present

## 2016-05-03 DIAGNOSIS — I482 Chronic atrial fibrillation: Secondary | ICD-10-CM | POA: Diagnosis not present

## 2016-05-03 DIAGNOSIS — I5040 Unspecified combined systolic (congestive) and diastolic (congestive) heart failure: Secondary | ICD-10-CM | POA: Diagnosis not present

## 2016-05-03 DIAGNOSIS — E1122 Type 2 diabetes mellitus with diabetic chronic kidney disease: Secondary | ICD-10-CM | POA: Diagnosis not present

## 2016-05-03 DIAGNOSIS — J449 Chronic obstructive pulmonary disease, unspecified: Secondary | ICD-10-CM | POA: Diagnosis not present

## 2016-05-03 DIAGNOSIS — E0822 Diabetes mellitus due to underlying condition with diabetic chronic kidney disease: Secondary | ICD-10-CM | POA: Diagnosis not present

## 2016-05-03 DIAGNOSIS — Z5181 Encounter for therapeutic drug level monitoring: Secondary | ICD-10-CM | POA: Diagnosis not present

## 2016-05-03 DIAGNOSIS — J189 Pneumonia, unspecified organism: Secondary | ICD-10-CM | POA: Diagnosis not present

## 2016-05-03 DIAGNOSIS — R1084 Generalized abdominal pain: Secondary | ICD-10-CM | POA: Diagnosis not present

## 2016-05-03 DIAGNOSIS — R1313 Dysphagia, pharyngeal phase: Secondary | ICD-10-CM | POA: Diagnosis not present

## 2016-05-03 DIAGNOSIS — I5032 Chronic diastolic (congestive) heart failure: Secondary | ICD-10-CM | POA: Diagnosis not present

## 2016-05-03 DIAGNOSIS — J9600 Acute respiratory failure, unspecified whether with hypoxia or hypercapnia: Secondary | ICD-10-CM | POA: Diagnosis not present

## 2016-05-03 DIAGNOSIS — F322 Major depressive disorder, single episode, severe without psychotic features: Secondary | ICD-10-CM | POA: Diagnosis not present

## 2016-05-03 DIAGNOSIS — R531 Weakness: Secondary | ICD-10-CM | POA: Diagnosis not present

## 2016-05-03 DIAGNOSIS — R278 Other lack of coordination: Secondary | ICD-10-CM | POA: Diagnosis not present

## 2016-05-03 DIAGNOSIS — I509 Heart failure, unspecified: Secondary | ICD-10-CM | POA: Diagnosis not present

## 2016-05-03 DIAGNOSIS — A419 Sepsis, unspecified organism: Secondary | ICD-10-CM | POA: Diagnosis not present

## 2016-05-03 DIAGNOSIS — R6521 Severe sepsis with septic shock: Secondary | ICD-10-CM | POA: Diagnosis not present

## 2016-05-03 DIAGNOSIS — N183 Chronic kidney disease, stage 3 (moderate): Secondary | ICD-10-CM | POA: Diagnosis not present

## 2016-05-03 DIAGNOSIS — R6520 Severe sepsis without septic shock: Secondary | ICD-10-CM | POA: Diagnosis not present

## 2016-05-03 DIAGNOSIS — I4891 Unspecified atrial fibrillation: Secondary | ICD-10-CM | POA: Diagnosis not present

## 2016-05-03 DIAGNOSIS — E119 Type 2 diabetes mellitus without complications: Secondary | ICD-10-CM | POA: Diagnosis not present

## 2016-05-03 DIAGNOSIS — Z950 Presence of cardiac pacemaker: Secondary | ICD-10-CM | POA: Diagnosis not present

## 2016-05-03 DIAGNOSIS — I10 Essential (primary) hypertension: Secondary | ICD-10-CM | POA: Diagnosis not present

## 2016-05-03 DIAGNOSIS — G4733 Obstructive sleep apnea (adult) (pediatric): Secondary | ICD-10-CM | POA: Diagnosis not present

## 2016-05-03 DIAGNOSIS — K219 Gastro-esophageal reflux disease without esophagitis: Secondary | ICD-10-CM | POA: Diagnosis not present

## 2016-05-03 DIAGNOSIS — J9611 Chronic respiratory failure with hypoxia: Secondary | ICD-10-CM | POA: Diagnosis not present

## 2016-05-03 DIAGNOSIS — E785 Hyperlipidemia, unspecified: Secondary | ICD-10-CM | POA: Diagnosis not present

## 2016-05-03 DIAGNOSIS — I442 Atrioventricular block, complete: Secondary | ICD-10-CM | POA: Diagnosis not present

## 2016-05-03 DIAGNOSIS — I5021 Acute systolic (congestive) heart failure: Secondary | ICD-10-CM | POA: Diagnosis not present

## 2016-05-03 LAB — BASIC METABOLIC PANEL
Anion gap: 11 (ref 5–15)
BUN: 12 mg/dL (ref 6–20)
CALCIUM: 8.7 mg/dL — AB (ref 8.9–10.3)
CO2: 26 mmol/L (ref 22–32)
CREATININE: 0.99 mg/dL (ref 0.44–1.00)
Chloride: 103 mmol/L (ref 101–111)
GFR calc Af Amer: 60 mL/min — ABNORMAL LOW (ref >60)
GFR, EST NON AFRICAN AMERICAN: 52 mL/min — AB (ref >60)
GLUCOSE: 127 mg/dL — AB (ref 65–99)
Potassium: 3.4 mmol/L — ABNORMAL LOW (ref 3.5–5.1)
Sodium: 140 mmol/L (ref 135–145)

## 2016-05-03 LAB — GLUCOSE, CAPILLARY
GLUCOSE-CAPILLARY: 148 mg/dL — AB (ref 65–99)
Glucose-Capillary: 120 mg/dL — ABNORMAL HIGH (ref 65–99)

## 2016-05-03 LAB — CBC
HEMATOCRIT: 35.9 % — AB (ref 36.0–46.0)
Hemoglobin: 11.8 g/dL — ABNORMAL LOW (ref 12.0–15.0)
MCH: 28.6 pg (ref 26.0–34.0)
MCHC: 32.9 g/dL (ref 30.0–36.0)
MCV: 86.9 fL (ref 78.0–100.0)
PLATELETS: 292 10*3/uL (ref 150–400)
RBC: 4.13 MIL/uL (ref 3.87–5.11)
RDW: 16.5 % — AB (ref 11.5–15.5)
WBC: 9.9 10*3/uL (ref 4.0–10.5)

## 2016-05-03 LAB — MAGNESIUM: Magnesium: 1.9 mg/dL (ref 1.7–2.4)

## 2016-05-03 MED ORDER — LOSARTAN POTASSIUM 25 MG PO TABS: 12.5000 mg | ORAL_TABLET | Freq: Every day | ORAL | 0 refills | Status: DC

## 2016-05-03 MED ORDER — FUROSEMIDE 40 MG PO TABS: 40.0000 mg | ORAL_TABLET | Freq: Every day | ORAL | 0 refills | Status: DC

## 2016-05-03 MED ORDER — POTASSIUM CHLORIDE CRYS ER 20 MEQ PO TBCR
40.0000 meq | EXTENDED_RELEASE_TABLET | Freq: Once | ORAL | Status: AC
  Administered 2016-05-03: 40 meq via ORAL
  Filled 2016-05-03: qty 2

## 2016-05-03 MED ORDER — DOXYCYCLINE HYCLATE 100 MG PO TABS: 100.0000 mg | ORAL_TABLET | Freq: Two times a day (BID) | ORAL | 0 refills | Status: AC

## 2016-05-03 MED ORDER — GUAIFENESIN ER 600 MG PO TB12: 600.0000 mg | ORAL_TABLET | Freq: Two times a day (BID) | ORAL | 0 refills | Status: DC

## 2016-05-03 MED ORDER — POTASSIUM CHLORIDE CRYS ER 20 MEQ PO TBCR: 20.0000 meq | EXTENDED_RELEASE_TABLET | Freq: Every day | ORAL | 0 refills | Status: DC

## 2016-05-03 NOTE — Progress Notes (Signed)
Pt discharged via [facility]. Transporters at bedside. [facility] has all paperwork including DNR form and AVS form. Pt on 3 litters nasal canula, IV discontinued and telemetry removed. Pt has all belongings including cell phone and mail.    [name]

## 2016-05-03 NOTE — Progress Notes (Signed)
Call [facility], report given to [facility]. Pt telemetry removed and peripheral Iv, catheter intact. IV team removed PICC line. DNR form signed by MD and AVS completed. Awaiting [facility] transportation. Pt has all belongings. Family took clothing and stated they would bring new clothing to [facility].   Phuong Moffatt Leory Plowman

## 2016-05-03 NOTE — Progress Notes (Signed)
Family at bedside asking about discharge. Stated social work will be in [employer] with [facility].   [name]

## 2016-05-03 NOTE — Discharge Summary (Addendum)
Discharge Summary  [name] WNU:[id number] DOB: 01-Mar-1932  PCP: Tiffany Koch, MD  Admit date: 04/25/2016 Discharge date: 05/03/2016  Time spent: >68mns  Recommendations for Outpatient Follow-up:  1. F/u with SNF MD  for hospital discharge follow up, repeat cbc/bmp at follow up 2. F/u with cardiology in two weeks 3. F/u with pulmonology    Discharge Diagnoses:  Active Hospital Problems   Diagnosis Date Noted  . Acute respiratory failure ([facility] 04/25/2016  . Acute systolic CHF (congestive heart failure) ([facility]   . Urinary tract infection without hematuria   . SOB (shortness of breath)   . Septic shock ([facility] 04/26/2016  . Community acquired pneumonia 04/25/2016  . Severe sepsis ([facility]   . GERD (gastroesophageal reflux disease) 07/19/2012  . Depression 06/06/2012  . Diastolic CHF, chronic ([facility] 12/15/2011  . Hypotension 09/05/2009  . Controlled diabetes mellitus type 2 with complications ([facility] [id number] . Obstructive sleep apnea 06/01/2008  . Atrial fibrillation ([facility] 06/01/2008  . Hyperlipidemia 12/30/2006  . Coronary atherosclerosis 12/30/2006    Resolved Hospital Problems   Diagnosis Date Noted Date Resolved  No resolved problems to display.    Discharge Condition: stable  Diet recommendation: heart healthy/carb modified  Filed Weights   05/01/16 0432 05/02/16 [id number]/25/18 0425  Weight: 79.7 kg (175 lb 9.6 oz) 78.2 kg (172 lb 8 oz) 77.2 kg (170 lb 3.1 oz)    History of present illness:  PCP: Tiffany Koch MD   Chief Complaint: "She was talking nonsensical."-Son  HPI: Tiffany MESINAis a 83y.o. female  with past medical history significant for atrial fibrillation, asthma, coronary disease, heart failure, CK D, COPD, depression, diabetes, reflux, pacemaker placement presents emergency room with chief complaint of shortness of breath. Per family patient has been with a respiratory infection and cough since December 2017. This is not  improved. Patient went and saw her primary care doctor recently was prescribed Singulair. This had no effect. Patient continued to worsen at home. She spoke on the phone to her son on the day of admission and reportedly was making no sense at all. Her son activated EMS. Patient was found to be hypoxic and her oxygen was titrated up from 2 L which she is on chronically to 4 liters. And patient responded. Patient has not been on any antibiotics recently. Patient was transferred to the emergency room.  Denies fever, chills, nausea, vomiting, rash. Positive for weakness in legs, cough, shortness of breath and confusion.  ED course: Chest x-ray was done that showed patient had a pneumonia. Vital signs were difficult to get due to patient's pain syndrome, lead to difficulties with blood pressure cuff. Patient got antibiotics in the emergency room. Hospitalist was consulted for admission.  Hospital Course:  Principal Problem:   Acute respiratory failure (HCC) Active Problems:   Controlled diabetes mellitus type 2 with complications ([facility]   Hyperlipidemia   Obstructive sleep apnea   Coronary atherosclerosis   Atrial fibrillation (HCC)   Hypotension   Diastolic CHF, chronic (HCC)   Depression   GERD (gastroesophageal reflux disease)   Community acquired pneumonia   Severe sepsis (HCC)   Septic shock (HCC)   SOB (shortness of breath)   Acute systolic CHF (congestive heart failure) (HCC)   Urinary tract infection without hematuria   Acute on chronic hypoxic respiratory failure/ from pneumonia and acute systolic heart failure, septic shock and cardiogenic shock: She presented to the ED with confusion, hypotension on 2/17, she is admitted to  hospitalist service initially, due to persistent hypoxia, hypotension despite fluids resuscitation and iv abx. she is transferred to ICU, she was started on pressors with levophed and neo and bipap, she has improved, she is weaned off pressors on 2/20.  She is  transferred to hospitalist service on 2/22 Improving, continue to wean oxygen to baseline. ( she report baseline she use o2 2liter prn)  Sepsis/septic shock from pna,  Enterococcus uti Patient report upper respiratory symptom for a few weeks prior to coming to the hospital Blood culture negative, urine culture 20,000 enterococcus sensitive, sputum sample was not collected Leukocytosis resolved, wbc 9.9 at discharge, no fever, bp stable, less  productive cough, She is treated with vanc/zosyn initially, abx changed to levaquin, last dose end on 2/23. will treat with doxycycline for additional three days to finish total of 10 days treatment   Acute systolic chf, DZ32-99%, from sepsis-related versus Takotsubo cardiomyopathy per cardiology  troponin elevation, troponin peaked at 0.52, likely demand ischemia per cardiology CXR on 2/21: 1. Cardiomegaly with mild vascular congestion and pulmonary edema,relatively similar prior study.2. Small left pleural effusion with left lung base atelectasis versus infiltrate. Repeat cxr on 2/23: 1. CHF pattern is borderline improved since 2 days ago. 2. Small left pleural effusion which could obscure atelectasis or pneumonia. No lower extremity edema on exam Currently on cozaar12.'5mg'$  (started on 2/22), lasix '40mg'$  daily, good urine output, no edema at discharge, cr 0.99 at discharge  Permanent afib: s/p pulmonary vein ablation and junction ablation in the past on apixaban, rate controlled without meds, home meds atenolol held, cardiology follow up H/o bradycardia s/p pacemaker placement  AKI on CKD III, cr 1.29 on admission, cr improving, cr 0.8-0.9 for the last few days  Hypokalemia/hypomagnesemia: replaced, monitor She is discharged on potassium supplement 74mq daily,  I have talked to the pharmacy to confirmed this dose  ( it appeared on discharge meds section "Take 1 tablet (20 mEq total) by mouth daily. 670m in am, 4025min pm, likely a computer  generated error)  COPD , quit smoking 51yr60yr, on home o2 at night, on cpap at night  currently no wheezing. Continue advair (dulera inhouse)/spiriva/singulair Follow up with pulmonology  noninsulin dependent dm2, a1c 7, home med metfromin held, on ssi here Resume metformin at discharge  MRSA colonization: on contact precaution and decolonization protocol  Code Status: DNR  Family Communication: patient at bedside, son over the phone on 2/24  Disposition Plan:  I have discussed with cardiology on 2/24 who state patient will on current medication for cardiac issues, patient is cleared to discharge from cardiac stand point   picc line removed prior to discharge Continue home o2 at discharge  SNF  on 2/25   Consultants:  [facility] [place], transfer to icu on 2/17  [facility]  [facility], signed off  cardiology  Procedures:  bipap  picc line placement on 2/18  Antibiotics:  vanc on 2/17 and 2/18  Zosyn from 2/17 to 2/20  levaquin from 2/20 to 2/23  Doxycycline from 2/24-   Discharge Exam: BP (!) 112/56 (BP Location: Left Arm)   Pulse 80   Temp 97.7 F (36.5 C) (Oral)   Resp 18   Ht '5\' 5"'$  (1.651 m)   Wt 77.2 kg (170 lb 3.1 oz)   SpO2 95%   BMI 28.32 kg/m    General:  NAD  Cardiovascular: RRR, paced  Respiratory: diminished at basis, mild scattered rhonchi has almost resolved, no wheezing  Abdomen: Soft/ND/NT, positive BS  Musculoskeletal: No Edema  Neuro: aaox3  Discharge Instructions You were cared for by a hospitalist during your hospital stay. If you have any questions about your discharge medications or the care you received while you were in the hospital after you are discharged, you can call the unit and asked to speak with the hospitalist on call if the hospitalist that took care of you is not available. Once you are discharged, your primary care physician will handle any further medical issues. Please note that NO REFILLS for any  discharge medications will be authorized once you are discharged, as it is imperative that you return to your primary care physician (or establish a relationship with a primary care physician if you do not have one) for your aftercare needs so that they can reassess your need for medications and monitor your lab values.  Discharge Instructions    Diet - low sodium heart healthy    Complete by:  As directed    Increase activity slowly    Complete by:  As directed      Allergies as of 05/03/2016      Reactions   Cholestatin Other (See Comments)   RAGWEED SEASON...sneezing   Sulfur Itching      Medication List    STOP taking these medications   atenolol 25 MG tablet Commonly known as:  TENORMIN   HYDROcodone-homatropine 5-1.5 MG/5ML syrup Commonly known as:  HYCODAN   torsemide 10 MG tablet Commonly known as:  DEMADEX     TAKE these medications   acetaminophen 650 MG CR tablet Commonly known as:  TYLENOL Take 1,300 mg by mouth every 8 (eight) hours as needed for pain.   AMITIZA 24 MCG capsule Generic drug:  lubiprostone TAKE 1 CAPSULE TWICE DAILY WITH A MEAL   atorvastatin 20 MG tablet Commonly known as:  LIPITOR TAKE 1 TABLET EVERY DAY   B-D ULTRA-FINE 33 LANCETS Misc Use to help check blood sugars daily Dx E11.9   BIOTIN 5000 5 MG Caps Generic drug:  Biotin Take 5 mg by mouth at bedtime.   doxycycline 100 MG tablet Commonly known as:  VIBRA-TABS Take 1 tablet (100 mg total) by mouth every 12 (twelve) hours.   ELIQUIS 5 MG Tabs tablet Generic drug:  apixaban TAKE 1 TABLET TWICE DAILY  (MUST  ESTABLISH  NEW  PRIMARY CARE PROVIDER  FOR FUTURE REFILLS)   Fluticasone-Salmeterol 250-50 MCG/DOSE Aepb Commonly known as:  ADVAIR Inhale 1 puff into the lungs 2 (two) times daily.   furosemide 40 MG tablet Commonly known as:  LASIX Take 1 tablet (40 mg total) by mouth daily. Start taking on:  05/04/2016   glucose blood test strip Commonly known as:  BAYER CONTOUR  TEST 1 each by Other route daily. Use to check blood sugars every day Dx E11.9   guaiFENesin 600 MG 12 hr tablet Commonly known as:  MUCINEX Take 1 tablet (600 mg total) by mouth 2 (two) times daily.   levalbuterol 0.63 MG/3ML nebulizer solution Commonly known as:  XOPENEX Take 3 mLs (0.63 mg total) by nebulization every 6 (six) hours as needed for wheezing or shortness of breath.   levalbuterol 45 MCG/ACT inhaler Commonly known as:  XOPENEX HFA Inhale 2 puffs into the lungs every 6 (six) hours as needed. Reported on 09/23/2015   losartan 25 MG tablet Commonly known as:  COZAAR Take 0.5 tablets (12.5 mg total) by mouth daily. Start taking on:  05/04/2016   metFORMIN  500 MG tablet Commonly known as:  GLUCOPHAGE TAKE 1 TABLET TWICE DAILY WITH A MEAL   montelukast 10 MG tablet Commonly known as:  SINGULAIR Take 1 tablet (10 mg total) by mouth at bedtime.   multivitamin with minerals tablet Take 1 tablet by mouth 2 (two) times daily.   potassium chloride SA 20 MEQ tablet Commonly known as:  K-DUR,KLOR-CON Take 1 tablet (20 mEq total) by mouth daily. 64mq in am, 416m in pm What changed:  how much to take  when to take this   tiotropium 18 MCG inhalation capsule Commonly known as:  SPIRIVA HANDIHALER INHALE THE CONTENTS OF 1 CAPSULE EVERY DAY      Allergies  Allergen Reactions  . Cholestatin Other (See Comments)    RAGWEED SEASON...sneezing   . Sulfur Itching   Follow-up Information    TaRexene EdisonNP Follow up on 05/15/2016.   Specialty:  Pulmonary Disease Why:  1130 for check in and then chest xray, followed by 1145 w/ Tammy P Contact information: [phone]          The results of significant diagnostics from this hospitalization (including imaging, microbiology, ancillary and laboratory) are listed below for reference.    Significant Diagnostic Studies: Dg Chest 2 View  Result Date: 05/01/2016 CLINICAL  DATA:  Shortness of breath and pneumonia. EXAM: CHEST  2 VIEW COMPARISON:  Two days ago FINDINGS: Mildly improved aeration. There is still generalized hazy opacity with discrete pleural effusion on the left. Chronic cardiomegaly. Unremarkable positioning of right upper extremity PICC in dual-chamber pacer leads. IMPRESSION: 1. CHF pattern is borderline improved since 2 days ago. 2. Small left pleural effusion which could obscure atelectasis or pneumonia. Electronically Signed   By: JoMonte Fantasia.D.   On: 05/01/2016 11:16   Dg Chest 2 View  Result Date: 04/25/2016 CLINICAL DATA:  Shortness of Breath EXAM: CHEST  2 VIEW COMPARISON:  October 24, 2014 FINDINGS: There is airspace consolidation in the posterior left base. The lungs elsewhere are clear. Heart size and pulmonary vascularity are normal. No adenopathy. Pacemaker leads are attached to the right atrium and middle cardiac vein. No pneumothorax. There is aortic atherosclerosis. Mean no bone lesions evident. IMPRESSION: Airspace consolidation consistent with pneumonia posterior aspect left lower lobe. Lungs elsewhere clear. Aortic atherosclerosis present. Followup PA and lateral chest radiographs recommended in 3-4 weeks following trial of antibiotic therapy to ensure resolution and exclude underlying malignancy. Electronically Signed   By: WiLowella GripII M.D.   On: 04/25/2016 12:43   Dg Chest Port 1 View  Result Date: 04/29/2016 CLINICAL DATA:  [age]ear old female with shortness of breath and pneumonia. EXAM: PORTABLE CHEST 1 VIEW COMPARISON:  Chest radiograph dated 04/27/2016 FINDINGS: Right-sided PICC with tip over right atrium in stable positioning. Left pectoral dual lead pacemaker device. There is mild cardiomegaly with pulmonary vascular congestion and mild interstitial edema. Overall the degree of vascular congestion and edema is relatively similar or slightly improved since the prior radiograph. There is a small left pleural effusion with  left lung base hazy density, likely atelectasis versus infiltrate. No pneumothorax. There is degenerative changes of the spine and shoulders. No acute osseous pathology. IMPRESSION: 1. Cardiomegaly with mild vascular congestion and pulmonary edema, relatively similar prior study. 2. Small left pleural effusion with left lung base atelectasis versus infiltrate. Electronically Signed   By: ArAnner Crete.D.   On: 04/29/2016 06:06   [facility] [address]  Result Date: 04/27/2016 CLINICAL DATA:  Follow-up examination for pneumonia, shortness of breath. EXAM: PORTABLE CHEST 1 VIEW COMPARISON:  Prior radiograph from 04/26/2016. FINDINGS: Left-sided pacemaker/AICD in place. Right-sided PICC catheter in place with tip overlying the cavoatrial junction, stable. Stable cardiomegaly. Mediastinal silhouette within normal limits. Aortic atherosclerosis. Lung somewhat hypoinflated. Diffuse pulmonary vascular congestion with interstitial prominence, most compatible with pulmonary edema, worsened as compared to previous. Persistent dense retrocardiac left lower lobe opacity. Small left pleural effusion is similar. No pneumothorax. Osseous structures unchanged. Severe degenerative changes about the shoulders bilaterally. IMPRESSION: 1. Stable cardiomegaly with worsened pulmonary edema as compared to 04/26/16. 2. Persistent small left pleural effusion. Associated left basilar opacity may reflect atelectasis and/or infiltrate or edema. Electronically Signed   By: Jeannine Boga M.D.   On: 04/27/2016 04:25   Dg Chest Port 1 View  Result Date: 04/26/2016 CLINICAL DATA:  Confirm central line placement EXAM: PORTABLE CHEST 1 VIEW COMPARISON:  Yesterday FINDINGS: New right upper extremity PICC with tip at the upper cavoatrial junction. Dual-chamber pacer leads from the left are in stable position. Cardiomegaly with generalized interstitial coarsening and small left effusion. IMPRESSION: 1. New right upper extremity PICC  with tip at the upper cavoatrial junction. 2. Pulmonary edema and small left effusion. Electronically Signed   By: Monte Fantasia M.D.   On: 04/26/2016 13:08   Dg Chest Port 1 View  Result Date: 04/26/2016 CLINICAL DATA:  Respiratory failure. EXAM: PORTABLE CHEST 1 VIEW COMPARISON:  04/25/2016 FINDINGS: Cardiac pacemaker. Slightly shallow inspiration. Heart size and pulmonary vascularity are normal. Infiltration in the left lower lung is again demonstrated consistent with pneumonia. No significant progression, lying for differences in technique. Right lung is clear. Calcification of the aorta. No pneumothorax. Degenerative changes in the spine and shoulders. IMPRESSION: Airspace infiltration in the left lower lung consistent with pneumonia. No significant progression. Electronically Signed   By: Lucienne Capers M.D.   On: 04/26/2016 00:27   Dg Abd Portable 1 View  Result Date: 04/25/2016 CLINICAL DATA:  RIGHT upper quadrant pain for 5 days EXAM: PORTABLE ABDOMEN - 1 VIEW COMPARISON:  04/25/2012 FINDINGS: Single air-filled upper normal caliber loops of small bowel in the mid abdomen. Scattered gas and stool throughout colon. No definite bowel dilatation or bowel wall thickening. Numerous vascular stents throughout the RIGHT iliac system with atherosclerotic calcifications of the LEFT iliac system noted. Bones demineralized with degenerative changes lumbar spine. Prior and LEFT acetabular reconstruction and LEFT hip replacement. IMPRESSION: Nonspecific bowel gas pattern. Electronically Signed   By: Lavonia Dana M.D.   On: 04/25/2016 16:47   Dg Swallowing Func-speech Pathology  Result Date: 04/27/2016 Objective Swallowing Evaluation: Type of Study: MBS-Modified Barium Swallow Study Patient Details Name: REYNE FALCONI MRN: [id number] Date of Birth: [birth year] Today's Date: 04/27/2016 Time: SLP Start Time (ACUTE ONLY): 1315-SLP Stop Time (ACUTE ONLY): 1340 SLP Time Calculation (min) (ACUTE ONLY): 25 min Past  Medical History: Past Medical History: Diagnosis Date . Acute blood loss anemia 04/20/2012 . AF (atrial fibrillation) ([place])    AV ablation 9/09 [place] per Dr Ola Spurr - AV node ablation 9/11 Dr Caryl Comes . Amiodarone pulmonary toxicity  . Asthma  . Bronchitis   hx of  . [name] (coronary artery disease)   (not sure of this 11/10 . CHF (congestive heart failure) ([place])  . Chronic renal insufficiency, stage III (moderate)   CrCl about 60 ml/min . CKD (chronic kidney disease) stage 3, GFR 30-59 ml/min  . Complication of anesthesia   "  psycotic episode" after hip surg - resolved . COPD (chronic obstructive pulmonary disease) (HCC)   emphysema -FeV1 73% DLCO 53% 5/09 . CVA (cerebral vascular accident) ([place])   no residual effects evident to pt  . Depression 06/06/2012 . Diabetes ([place])  . Diastolic heart failure   Acute on Chronic . Eczema  . GERD (gastroesophageal reflux disease)  . History of skin cancer  . Hx of cardiovascular stress test   Lexiscan Myoview (09/2013):  No definite ischemia, EF 67%; low risk . Hyperlipidemia  . Invasive ductal carcinoma of breast ([place]) 2011  LEFT  . Mental disorder  . OA (osteoarthritis) of knee   RIGHT . Pacemaker   Permanent . PAD (peripheral artery disease) (HCC)   w/hx right iliac/SFA stenting and left and rt leg PTA . Pain   pt states has pain in fingertips per right hand pt states has been told may be carpal tunnel . Pneumonia   hx of  . Right sided sciatica  . Shortness of breath dyspnea   walking distances  . Sinoatrial node dysfunction (HCC)  . Sleep apnea   associated with hypersomnia uses O2 2L/M at night and during naps also uses CPAP . Tobacco abuse  . Tremors of nervous system   hands bilat  Past Surgical History: Past Surgical History: Procedure Laterality Date . ATRIAL ABLATION SURGERY    x 2 - "did not help - had to have pacemaker" . BREAST LUMPECTOMY    left breast . CARDIAC CATHETERIZATION   . CATARACT EXTRACTION, BILATERAL    with IOL/Dr Katy Fitch . EP IMPLANTABLE DEVICE N/A  02/13/2015  Procedure: PPM Generator Changeout-St. Jude device;  Surgeon: Deboraha Sprang, MD;  Location: [place] [facility];  Service: Cardiovascular;  Laterality: N/A; . MASTECTOMY, PARTIAL  02/03/2010  Left/Dr Rosenbower . ORIF ACETABULAR FRACTURE Left 04/19/2012  Procedure: OPEN REDUCTION INTERNAL FIXATION (ORIF) ACETABULAR FRACTURE;  Surgeon: Rozanna Box, MD;  Location: [place];  Service: Orthopedics;  Laterality: Left; . sun spot removed     forhead . TOTAL HIP ARTHROPLASTY Left 10/31/2014  Procedure: LEFT TOTAL HIP ARTHROPLASTY POSTERIOR  APPROACH;  Surgeon: Gaynelle Arabian, MD;  Location: [facility];  Service: Orthopedics;  Laterality: Left; . TOTAL KNEE ARTHROPLASTY Right 10/16/2013  Procedure: RIGHT TOTAL KNEE ARTHROPLASTY;  Surgeon: Gearlean Alf, MD;  Location: [facility];  Service: Orthopedics;  Laterality: Right; Marland Kitchen VASCULAR SURGERY    both legs  HPI: [name] is a pleasant 83 y/o woman with a complex PMHx most notable for COPD and AF who presented to the ED with PNA. She he significant hypotension and was brought to the ICU for BiPAP and for peripheral pressors. concern for HCAP, aspiration. Pt observed to cough with RN No Data Recorded Assessment / Plan / Recommendation CHL IP CLINICAL IMPRESSIONS 04/27/2016 Clinical Impression Pt demonstrates a mild dysphagia, likely respiratory based, characterized by slighty delayed, incomplete laryngeal closure during the swallow. This results in flash penetration before/during the swallow and trace frank penetration and aspiration when taking larger sips or consecutive sips.  Inconsistent sensation noted. Advised pt to continue current diet (regular/thin) with particular precaution to take small, single sips, avoid straws and take meds whole in puree. Pt agreeable to plan.  May be beneficial to encourage intermittent throat clearing as well. Will f/u for tolerance.  SLP Visit Diagnosis Dysphagia, pharyngeal phase (R13.13) Attention and concentration deficit following --  Frontal lobe and executive function deficit following -- Impact on safety and function Mild aspiration risk  CHL IP TREATMENT RECOMMENDATION 04/27/2016 Treatment Recommendations Therapy as outlined in treatment plan below   Prognosis 04/27/2016 Prognosis for Safe Diet Advancement Good Barriers to Reach Goals -- Barriers/Prognosis Comment -- CHL IP DIET RECOMMENDATION 04/27/2016 SLP Diet Recommendations Regular solids;Thin liquid Liquid Administration via Cup;No straw Medication Administration Whole meds with puree Compensations Slow rate;Small sips/bites Postural Changes Seated upright at 90 degrees   CHL IP OTHER RECOMMENDATIONS 04/27/2016 Recommended Consults -- Oral Care Recommendations Oral care BID Other Recommendations --   CHL IP FOLLOW UP RECOMMENDATIONS 04/27/2016 Follow up Recommendations 24 hour supervision/assistance   CHL IP FREQUENCY AND DURATION 04/27/2016 Speech Therapy Frequency (ACUTE ONLY) min 2x/week Treatment Duration 1 week      CHL IP ORAL PHASE 04/27/2016 Oral Phase WFL Oral - Pudding Teaspoon -- Oral - Pudding Cup -- Oral - Honey Teaspoon -- Oral - Honey Cup -- Oral - Nectar Teaspoon -- Oral - Nectar Cup -- Oral - Nectar Straw -- Oral - Thin Teaspoon -- Oral - Thin Cup -- Oral - Thin Straw -- Oral - Puree -- Oral - Mech Soft -- Oral - Regular -- Oral - Multi-Consistency -- Oral - Pill -- Oral Phase - Comment --  CHL IP PHARYNGEAL PHASE 04/27/2016 Pharyngeal Phase Impaired Pharyngeal- Pudding Teaspoon -- Pharyngeal -- Pharyngeal- Pudding Cup -- Pharyngeal -- Pharyngeal- Honey Teaspoon -- Pharyngeal -- Pharyngeal- Honey Cup -- Pharyngeal -- Pharyngeal- Nectar Teaspoon -- Pharyngeal -- Pharyngeal- Nectar Cup -- Pharyngeal -- Pharyngeal- Nectar Straw -- Pharyngeal -- Pharyngeal- Thin Teaspoon -- Pharyngeal -- Pharyngeal- Thin Cup Penetration/Aspiration before swallow;Penetration/Aspiration during swallow;Reduced airway/laryngeal closure Pharyngeal Material enters airway, CONTACTS cords and then ejected  out Pharyngeal- Thin Straw Penetration/Aspiration before swallow;Penetration/Aspiration during swallow;Reduced airway/laryngeal closure;Trace aspiration Pharyngeal Material enters airway, passes BELOW cords then ejected out;Material enters airway, CONTACTS cords and not ejected out;Material enters airway, CONTACTS cords and then ejected out Pharyngeal- Puree WFL Pharyngeal -- Pharyngeal- Mechanical Soft -- Pharyngeal -- Pharyngeal- Regular WFL Pharyngeal -- Pharyngeal- Multi-consistency -- Pharyngeal -- Pharyngeal- Pill Penetration/Aspiration before swallow;Penetration/Aspiration during swallow Pharyngeal Material enters airway, CONTACTS cords and then ejected out Pharyngeal Comment --  No flowsheet data found. No flowsheet data found. Herbie [place], [place] [phone] DeBlois, Katherene Ponto 04/27/2016, 2:14 PM               Microbiology: Recent Results (from the past 240 hour(s))  Blood Culture (routine x 2)     Status: None   Collection Time: 04/25/16 11:40 AM  Result Value Ref Range Status   Specimen Description BLOOD RIGHT ANTECUBITAL  Final   Special Requests BOTTLES DRAWN AEROBIC AND ANAEROBIC  5CC  Final   Culture NO GROWTH 5 DAYS  Final   Report Status 04/30/2016 FINAL  Final  Blood Culture (routine x 2)     Status: None   Collection Time: 04/25/16 11:58 AM  Result Value Ref Range Status   Specimen Description BLOOD RIGHT FOREARM  Final   Special Requests BOTTLES DRAWN AEROBIC AND ANAEROBIC 5CC  Final   Culture NO GROWTH 5 DAYS  Final   Report Status 04/30/2016 FINAL  Final  Urine culture     Status: Abnormal   Collection Time: 04/25/16 12:16 PM  Result Value Ref Range Status   Specimen Description URINE, RANDOM  Final   Special Requests Immunocompromised  Final   Culture 20,000 COLONIES/mL ENTEROCOCCUS FAECALIS (A)  Final   Report Status 04/27/2016 FINAL  Final   Organism ID, Bacteria ENTEROCOCCUS FAECALIS (A)  Final  Susceptibility   Enterococcus faecalis - MIC*     AMPICILLIN <=2 SENSITIVE Sensitive     LEVOFLOXACIN 1 SENSITIVE Sensitive     NITROFURANTOIN <=16 SENSITIVE Sensitive     VANCOMYCIN 1 SENSITIVE Sensitive     * 20,000 COLONIES/mL ENTEROCOCCUS FAECALIS  MRSA PCR Screening     Status: Abnormal   Collection Time: 04/25/16  7:44 PM  Result Value Ref Range Status   MRSA by PCR POSITIVE (A) NEGATIVE Final    Comment:        The GeneXpert MRSA Assay (FDA approved for NASAL specimens only), is one component of a comprehensive MRSA colonization surveillance program. It is not intended to diagnose MRSA infection nor to guide or monitor treatment for MRSA infections. RESULT CALLED TO, READ BACK BY AND VERIFIED WITH: MCKIBBEN,K RN [id number] 04/25/16 MITCHELL,L      Labs: Basic Metabolic Panel:  Recent Labs Lab 04/28/16 1815 04/29/16 0400 04/30/16 0407 05/01/16 0439 05/02/16 0451 05/03/16 0505  NA 141 136 138 139 140 140  K 4.0 3.8 4.1 3.9 3.7 3.4*  CL 107 105 105 106 103 103  CO2 '24 23 24 26 27 26  '$ GLUCOSE 169* 136* 103* 122* 130* 127*  BUN '12 10 10 9 11 12  '$ CREATININE 0.91 0.90 0.84 0.93 0.92 0.99  CALCIUM 8.1* 7.8* 8.2* 8.3* 8.8* 8.7*  MG 1.9 1.8 2.0 1.8 1.8 1.9  PHOS 1.0* 1.5*  --   --   --   --    Liver Function Tests:  Recent Labs Lab 05/01/16 0439  AST 36  ALT 46  ALKPHOS 72  BILITOT 0.9  PROT 5.8*  ALBUMIN 2.1*   No results for input(s): LIPASE, AMYLASE in the last 168 hours. No results for input(s): AMMONIA in the last 168 hours. CBC:  Recent Labs Lab 04/27/16 0930 04/28/16 0400 05/01/16 0439 05/02/16 0451 05/03/16 0505  WBC 25.3* 23.4* 14.1* 12.0* 9.9  NEUTROABS  --   --  10.4* 8.1*  --   HGB 11.1* 11.1* 11.6* 12.4 11.8*  HCT 32.7* 33.2* 35.3* 37.8 35.9*  MCV 86.7 85.3 86.9 86.9 86.9  PLT 174 203 250 284 292   Cardiac Enzymes:  Recent Labs Lab 04/26/16 1524 04/27/16 0930  TROPONINI 0.52* 0.35*   BNP: BNP (last 3 results) No results for input(s): BNP in the last 8760 hours.  ProBNP (last  3 results) No results for input(s): PROBNP in the last 8760 hours.  CBG:  Recent Labs Lab 05/02/16 1130 05/02/16 1633 05/02/16 2234 05/03/16 0750 05/03/16 1146  GLUCAP 142* 91 145* 120* 148*       Signed:  Selmer Adduci MD, PhD  [facility] 05/03/2016, 11:52 AM

## 2016-05-03 NOTE — Clinical Social Work Note (Signed)
Clinical Social Worker facilitated patient discharge including contacting patient family and facility [facility])  to confirm patient discharge plans. Clinical information faxed to facility and family(Son) agreeable with plan.  CSW arranged ambulance transport via [facility] to [facility]. RN to call report [phone] prior to discharge.  Clinical Social Worker will sign off for now as social work intervention is no longer needed. Please consult [name] again if new need arises.  Lakyla Biswas B. Joline Maxcy Clinical Social Work Dept Weekend Social Worker [phone] 11:58 AM

## 2016-05-03 NOTE — Clinical Social Work Placement (Signed)
   CLINICAL SOCIAL WORK PLACEMENT  NOTE  Date:  05/03/2016  Patient Details  Name: [name] MRN: [id number] Date of Birth: 08-26-1931  Clinical Social Work is seeking post-discharge placement for this patient at the [place] level of care (*CSW will initial, date and re-position this form in  chart as items are completed):  Yes   Patient/family provided with [facility] Work Department's list of facilities offering this level of care within the geographic area requested by the patient (or if unable, by the patient's family).  Yes   Patient/family informed of their freedom to choose among providers that offer the needed level of care, that participate in Medicare, Medicaid or managed care program needed by the patient, have an available bed and are willing to accept the patient.  Yes   Patient/family informed of [facility]'s ownership interest in [facility] and [facility], as well as of the fact that they are under no obligation to receive care at these facilities.  PASRR submitted to EDS on 04/30/16     PASRR number received on 04/30/16     Existing PASRR number confirmed on       FL2 transmitted to all facilities in geographic area requested by pt/family on 04/30/16     FL2 transmitted to all facilities within larger geographic area on       Patient informed that his/her managed care company has contracts with or will negotiate with certain facilities, including the following:        Yes   Patient/family informed of bed offers received.  Patient chooses bed at  [facility])     Physician recommends and patient chooses bed at      Patient to be transferred to  ([facility]) on 05/03/16.  Patient to be transferred to facility by  [facility])     Patient family notified on 05/03/16 of transfer.  Name of family member notified:  Son     PHYSICIAN       Additional Comment:     _______________________________________________ Serafina Mitchell, [place] 05/03/2016, 12:00 PM

## 2016-05-03 NOTE — Clinical Social Work Note (Signed)
FU with pt's family to discuss rehab facility options, spoke to Dr. Radford Pax. The family has selected [facility].  CSW contacted [facility], left VM message to advise pt will likely DC today.   CSW will continue to follow.  Quame Spratlin B. Joline Maxcy Clinical Social Work Dept Weekend Social Worker [phone] 9:28 AM

## 2016-05-03 NOTE — Progress Notes (Signed)
Stopped MD Xu in hallway and asked for indication for pt PICC, pt not receiving IV therapy. MD stated she would place an order to remove it  [name]

## 2016-05-04 ENCOUNTER — Telehealth: Payer: Self-pay | Admitting: *Deleted

## 2016-05-04 DIAGNOSIS — I482 Chronic atrial fibrillation: Secondary | ICD-10-CM | POA: Diagnosis not present

## 2016-05-04 DIAGNOSIS — J189 Pneumonia, unspecified organism: Secondary | ICD-10-CM | POA: Diagnosis not present

## 2016-05-04 DIAGNOSIS — J449 Chronic obstructive pulmonary disease, unspecified: Secondary | ICD-10-CM | POA: Diagnosis not present

## 2016-05-04 DIAGNOSIS — I5021 Acute systolic (congestive) heart failure: Secondary | ICD-10-CM | POA: Diagnosis not present

## 2016-05-04 NOTE — Telephone Encounter (Signed)
Pt was on TCM list admitted for Acute respiratory failure, Acute systolic CH, and Community acquired pneumonia. Pt was D/C 05/03/16 to SNF will f/u w.MD @ SNF...Johny Chess

## 2016-05-06 ENCOUNTER — Ambulatory Visit: Payer: Medicare Other | Admitting: Internal Medicine

## 2016-05-07 DIAGNOSIS — J9600 Acute respiratory failure, unspecified whether with hypoxia or hypercapnia: Secondary | ICD-10-CM | POA: Diagnosis not present

## 2016-05-07 DIAGNOSIS — J189 Pneumonia, unspecified organism: Secondary | ICD-10-CM | POA: Diagnosis not present

## 2016-05-07 DIAGNOSIS — N390 Urinary tract infection, site not specified: Secondary | ICD-10-CM | POA: Diagnosis not present

## 2016-05-07 DIAGNOSIS — I4891 Unspecified atrial fibrillation: Secondary | ICD-10-CM | POA: Diagnosis not present

## 2016-05-07 DIAGNOSIS — G4733 Obstructive sleep apnea (adult) (pediatric): Secondary | ICD-10-CM | POA: Diagnosis not present

## 2016-05-07 DIAGNOSIS — N183 Chronic kidney disease, stage 3 (moderate): Secondary | ICD-10-CM | POA: Diagnosis not present

## 2016-05-07 DIAGNOSIS — E0822 Diabetes mellitus due to underlying condition with diabetic chronic kidney disease: Secondary | ICD-10-CM | POA: Diagnosis not present

## 2016-05-07 DIAGNOSIS — F322 Major depressive disorder, single episode, severe without psychotic features: Secondary | ICD-10-CM | POA: Diagnosis not present

## 2016-05-07 DIAGNOSIS — A419 Sepsis, unspecified organism: Secondary | ICD-10-CM | POA: Diagnosis not present

## 2016-05-07 DIAGNOSIS — J449 Chronic obstructive pulmonary disease, unspecified: Secondary | ICD-10-CM | POA: Diagnosis not present

## 2016-05-07 DIAGNOSIS — I509 Heart failure, unspecified: Secondary | ICD-10-CM | POA: Diagnosis not present

## 2016-05-11 DIAGNOSIS — E119 Type 2 diabetes mellitus without complications: Secondary | ICD-10-CM | POA: Diagnosis not present

## 2016-05-11 DIAGNOSIS — I5021 Acute systolic (congestive) heart failure: Secondary | ICD-10-CM | POA: Diagnosis not present

## 2016-05-11 DIAGNOSIS — J189 Pneumonia, unspecified organism: Secondary | ICD-10-CM | POA: Diagnosis not present

## 2016-05-11 DIAGNOSIS — J449 Chronic obstructive pulmonary disease, unspecified: Secondary | ICD-10-CM | POA: Diagnosis not present

## 2016-05-12 ENCOUNTER — Other Ambulatory Visit: Payer: Self-pay | Admitting: *Deleted

## 2016-05-12 NOTE — Patient Outreach (Signed)
[facility]) Care Management  05/12/2016  [name] [birth year] [id number]   Met with patient at bedside to review discharge plans and needs.  Patient was previously active with [facility] care management services.   Patient sitting in chair, wearing her oxygen. Patient reports she moved to an Assisted living facility around 3 months ago, but could not give [facility] name of facility. She reports there is nursing staff at facility and she also has been getting some type of other assistance but is unsure of what program they were from.  Patient did not want to take a brochure at this time stating she is not sure what she will need at discharge and did not want anything else to take home with her.   Plan: RNCM let SW know that patient eligible for [facility] services.  RNCM will monitor for any discharge needs and follow up as needed.  [name]. [name], RN, BSN, [place] [phone]) Business Cell  [phone]) Toll Free Office

## 2016-05-15 ENCOUNTER — Encounter: Payer: Self-pay | Admitting: Adult Health

## 2016-05-15 ENCOUNTER — Ambulatory Visit (INDEPENDENT_AMBULATORY_CARE_PROVIDER_SITE_OTHER)
Admission: RE | Admit: 2016-05-15 | Discharge: 2016-05-15 | Disposition: A | Discharge status: Home / Self Care | Payer: Medicare Other | Source: Ambulatory Visit | Attending: Adult Health | Admitting: Adult Health

## 2016-05-15 ENCOUNTER — Ambulatory Visit (INDEPENDENT_AMBULATORY_CARE_PROVIDER_SITE_OTHER): Payer: Medicare Other | Admitting: Adult Health

## 2016-05-15 VITALS — BP 114/72 | HR 74

## 2016-05-15 DIAGNOSIS — J189 Pneumonia, unspecified organism: Secondary | ICD-10-CM

## 2016-05-15 DIAGNOSIS — J9611 Chronic respiratory failure with hypoxia: Secondary | ICD-10-CM | POA: Diagnosis not present

## 2016-05-15 DIAGNOSIS — J9610 Chronic respiratory failure, unspecified whether with hypoxia or hypercapnia: Secondary | ICD-10-CM | POA: Insufficient documentation

## 2016-05-15 DIAGNOSIS — I5032 Chronic diastolic (congestive) heart failure: Secondary | ICD-10-CM | POA: Diagnosis not present

## 2016-05-15 DIAGNOSIS — J449 Chronic obstructive pulmonary disease, unspecified: Secondary | ICD-10-CM

## 2016-05-15 NOTE — Progress Notes (Signed)
$'@Patient'y$  ID: [name], female    DOB: June 27, 1931, 83 y.o.   MRN: [id number]  Chief Complaint  Patient presents with  . Follow-up    COPD     Referring provider: Hoyt Koch, *  HPI:  83 year old female former smoker followed for Gold C COPD  05/15/2016 Follow up : Post hospital follow up  Patient returns for a post hospital follow-up. Patient was recently admitted for acute respiratory failure with a community-acquired pneumonia, sepsis and acute systolic congestive heart failure. She did require initial ICU care with IV fluid resuscitation and pressor support. Echo showed decreased EF 25-30%. Pulmonary artery pressure was elevated, 56 mmHg. Chest x-ray showed left lower lobe pneumonia. She improved with IV antibiotics and diuresis. She was discharged on oxygen at 2 L.. Since discharge. Patient is feeling some better but remains weak. Appetite is lower.  Cough and congestion are decreased. Says legs have not been swelling.  She remains on Advair and Spiriva. She is in rehab center, she does not like it there. They are doing PT. Wants to go home.       Allergies  Allergen Reactions  . Cholestatin Other (See Comments)    RAGWEED SEASON...sneezing   . Sulfur Itching    Immunization History  Administered Date(s) Administered  . Influenza Split 12/23/2010, 12/24/2011, 01/04/2014  . Influenza Whole 12/11/2008, 12/07/2009  . Influenza, High Dose Seasonal PF 11/22/2015  . Influenza,inj,Quad PF,36+ Mos 12/08/2012, 11/07/2013, 12/24/2014  . PPD Test 11/03/2014  . Pneumococcal Conjugate-13 12/13/2013  . Pneumococcal Polysaccharide-23 12/19/2003, 11/22/2015  . Tdap 12/13/2013    Past Medical History:  Diagnosis Date  . Acute blood loss anemia 04/20/2012  . AF (atrial fibrillation) ([place])     AV ablation 9/09 [facility] per Dr Ola Spurr - AV node ablation 9/11 Dr Caryl Comes  . Amiodarone pulmonary toxicity   . Asthma   . Bronchitis    hx of   . [name] (coronary  artery disease)    (not sure of this 11/10  . CHF (congestive heart failure) ([place])   . Chronic renal insufficiency, stage III (moderate)    CrCl about 60 ml/min  . CKD (chronic kidney disease) stage 3, GFR 30-59 ml/min   . Complication of anesthesia    "psycotic episode" after hip surg - resolved  . COPD (chronic obstructive pulmonary disease) (HCC)    emphysema -FeV1 73% DLCO 53% 5/09  . CVA (cerebral vascular accident) ([place])    no residual effects evident to pt   . Depression 06/06/2012  . Diabetes ([place])   . Diastolic heart failure    Acute on Chronic  . Eczema   . GERD (gastroesophageal reflux disease)   . History of skin cancer   . Hx of cardiovascular stress test    Lexiscan Myoview (09/2013):  No definite ischemia, EF 67%; low risk  . Hyperlipidemia   . Invasive ductal carcinoma of breast ([place]) 2011   LEFT   . Mental disorder   . OA (osteoarthritis) of knee    RIGHT  . Pacemaker    Permanent  . PAD (peripheral artery disease) (HCC)    w/hx right iliac/SFA stenting and left and rt leg PTA  . Pain    pt states has pain in fingertips per right hand pt states has been told may be carpal tunnel  . Pneumonia    hx of   . Right sided sciatica   . Shortness of breath dyspnea    walking distances   .  Sinoatrial node dysfunction (HCC)   . Sleep apnea    associated with hypersomnia uses O2 2L/M at night and during naps also uses CPAP  . Tobacco abuse   . Tremors of nervous system    hands bilat     Tobacco History: History  Smoking Status  . Former Smoker  . Packs/day: 1.00  . Years: 55.00  . Types: Cigarettes  . Quit date: 12/19/2011  Smokeless Tobacco  . Never Used    Comment: started smoking at age 36--4 cigs per day.  smoked 2ppd the last 10 yrs   Counseling given: Not Answered   Outpatient Encounter Prescriptions as of 05/15/2016  Medication Sig  . acetaminophen (TYLENOL) 650 MG CR tablet Take 1,300 mg by mouth every 8 (eight) hours as needed for pain.  Marland Kitchen  AMITIZA 24 MCG capsule TAKE 1 CAPSULE TWICE DAILY WITH A MEAL  . atorvastatin (LIPITOR) 20 MG tablet TAKE 1 TABLET EVERY DAY  . B-D ULTRA-FINE 33 LANCETS MISC Use to help check blood sugars daily Dx E11.9  . Biotin (BIOTIN 5000) 5 MG CAPS Take 5 mg by mouth at bedtime.   Marland Kitchen ELIQUIS 5 MG TABS tablet TAKE 1 TABLET TWICE DAILY  (MUST  ESTABLISH  NEW  PRIMARY CARE PROVIDER  FOR FUTURE REFILLS)  . Fluticasone-Salmeterol (ADVAIR) 250-50 MCG/DOSE AEPB Inhale 1 puff into the lungs 2 (two) times daily.  . furosemide (LASIX) 40 MG tablet Take 1 tablet (40 mg total) by mouth daily.  Marland Kitchen glucose blood (BAYER CONTOUR TEST) test strip 1 each by Other route daily. Use to check blood sugars every day Dx E11.9  . guaiFENesin (MUCINEX) 600 MG 12 hr tablet Take 1 tablet (600 mg total) by mouth 2 (two) times daily.  Marland Kitchen levalbuterol (XOPENEX HFA) 45 MCG/ACT inhaler Inhale 2 puffs into the lungs every 6 (six) hours as needed. Reported on 09/23/2015  . levalbuterol (XOPENEX) 0.63 MG/3ML nebulizer solution Take 3 mLs (0.63 mg total) by nebulization every 6 (six) hours as needed for wheezing or shortness of breath.  . losartan (COZAAR) 25 MG tablet Take 0.5 tablets (12.5 mg total) by mouth daily.  . metFORMIN (GLUCOPHAGE) 500 MG tablet TAKE 1 TABLET TWICE DAILY WITH A MEAL  . montelukast (SINGULAIR) 10 MG tablet Take 1 tablet (10 mg total) by mouth at bedtime.  . Multiple Vitamins-Minerals (MULTIVITAMIN WITH MINERALS) tablet Take 1 tablet by mouth 2 (two) times daily.   . potassium chloride SA (K-DUR,KLOR-CON) 20 MEQ tablet Take 1 tablet (20 mEq total) by mouth daily. 26mq in am, 460m in pm  . tiotropium (SPIRIVA HANDIHALER) 18 MCG inhalation capsule INHALE THE CONTENTS OF 1 CAPSULE EVERY DAY   No facility-administered encounter medications on file as of 05/15/2016.      Review of Systems  Constitutional:   No  weight loss, night sweats,  Fevers, chills, + fatigue, or  lassitude.  HEENT:   No headaches,  Difficulty  swallowing,  Tooth/dental problems, or  Sore throat,                No sneezing, itching, ear ache,  +nasal congestion, post nasal drip,   CV:  No chest pain,  Orthopnea, PND, swelling in lower extremities, anasarca, dizziness, palpitations, syncope.   GI  No heartburn, indigestion, abdominal pain, nausea, vomiting, diarrhea, change in bowel habits, loss of appetite, bloody stools.   Resp:  .  No chest wall deformity  Skin: no rash or lesions.  GU: no dysuria, change in color  of urine, no urgency or frequency.  No flank pain, no hematuria   MS:  No joint pain or swelling.  No decreased range of motion.  No back pain.    Physical Exam  BP 114/72 (BP Location: Right Arm, Cuff Size: Normal)   Pulse 74   SpO2 97%    GEN: A/Ox3; pleasant , NAD, elderly , on O2    HEENT:  [place]/AT,  EACs-clear, TMs-wnl, NOSE-clear, THROAT-clear, no lesions, no postnasal drip or exudate noted.   NECK:  Supple w/ fair ROM; no JVD; normal carotid impulses w/o bruits; no thyromegaly or nodules palpated; no lymphadenopathy.    RESP  Drecreased BS in bases , . no accessory muscle use, no dullness to percussion  CARD:  RRR, no m/r/g, tr  peripheral edema, pulses intact, no cyanosis or clubbing.  GI:   Soft & nt; nml bowel sounds; no organomegaly or masses detected.   Musco: Warm bil, no deformities or joint swelling noted.   Neuro: alert, no focal deficits noted.    Skin: Warm, no lesions or rashes    Lab Results:  CBC   BNP No results found for: BNP   Imaging: Dg Chest 2 View  Result Date: 05/15/2016 CLINICAL DATA:  83 year old female with a history of pneumonia EXAM: CHEST  2 VIEW COMPARISON:  05/01/2016, 04/29/2016 FINDINGS: Cardiomediastinal silhouette unchanged. Cardiac pacing device on left chest wall with 2 leads in place. No large pleural effusion. No pneumothorax. No confluent airspace disease, with improved aeration compared to the prior plain film. Chronic interstitial opacities.  IMPRESSION: Improved aeration compared to the recent plain film, with no evidence of acute cardiopulmonary disease. Background chronic lung changes. Unchanged cardiac pacing device. Electronically Signed   By: Corrie Mckusick D.O.   On: 05/15/2016 13:19   Dg Chest 2 View  Result Date: 05/01/2016 CLINICAL DATA:  Shortness of breath and pneumonia. EXAM: CHEST  2 VIEW COMPARISON:  Two days ago FINDINGS: Mildly improved aeration. There is still generalized hazy opacity with discrete pleural effusion on the left. Chronic cardiomegaly. Unremarkable positioning of right upper extremity PICC in dual-chamber pacer leads. IMPRESSION: 1. CHF pattern is borderline improved since 2 days ago. 2. Small left pleural effusion which could obscure atelectasis or pneumonia. Electronically Signed   By: Monte Fantasia M.D.   On: 05/01/2016 11:16   Dg Chest 2 View  Result Date: 04/25/2016 CLINICAL DATA:  Shortness of Breath EXAM: CHEST  2 VIEW COMPARISON:  October 24, 2014 FINDINGS: There is airspace consolidation in the posterior left base. The lungs elsewhere are clear. Heart size and pulmonary vascularity are normal. No adenopathy. Pacemaker leads are attached to the right atrium and middle cardiac vein. No pneumothorax. There is aortic atherosclerosis. Mean no bone lesions evident. IMPRESSION: Airspace consolidation consistent with pneumonia posterior aspect left lower lobe. Lungs elsewhere clear. Aortic atherosclerosis present. Followup PA and lateral chest radiographs recommended in 3-4 weeks following trial of antibiotic therapy to ensure resolution and exclude underlying malignancy. Electronically Signed   By: Lowella Grip III M.D.   On: 04/25/2016 12:43   Dg Chest Port 1 View  Result Date: 04/29/2016 CLINICAL DATA:  83 year old female with shortness of breath and pneumonia. EXAM: PORTABLE CHEST 1 VIEW COMPARISON:  Chest radiograph dated 04/27/2016 FINDINGS: Right-sided PICC with tip over right atrium in stable  positioning. Left pectoral dual lead pacemaker device. There is mild cardiomegaly with pulmonary vascular congestion and mild interstitial edema. Overall the degree of vascular congestion and edema is  relatively similar or slightly improved since the prior radiograph. There is a small left pleural effusion with left lung base hazy density, likely atelectasis versus infiltrate. No pneumothorax. There is degenerative changes of the spine and shoulders. No acute osseous pathology. IMPRESSION: 1. Cardiomegaly with mild vascular congestion and pulmonary edema, relatively similar prior study. 2. Small left pleural effusion with left lung base atelectasis versus infiltrate. Electronically Signed   By: Anner Crete M.D.   On: 04/29/2016 06:06   Dg Chest Port 1 View  Result Date: 04/27/2016 CLINICAL DATA:  Follow-up examination for pneumonia, shortness of breath. EXAM: PORTABLE CHEST 1 VIEW COMPARISON:  Prior radiograph from 04/26/2016. FINDINGS: Left-sided pacemaker/AICD in place. Right-sided PICC catheter in place with tip overlying the cavoatrial junction, stable. Stable cardiomegaly. Mediastinal silhouette within normal limits. Aortic atherosclerosis. Lung somewhat hypoinflated. Diffuse pulmonary vascular congestion with interstitial prominence, most compatible with pulmonary edema, worsened as compared to previous. Persistent dense retrocardiac left lower lobe opacity. Small left pleural effusion is similar. No pneumothorax. Osseous structures unchanged. Severe degenerative changes about the shoulders bilaterally. IMPRESSION: 1. Stable cardiomegaly with worsened pulmonary edema as compared to 04/26/16. 2. Persistent small left pleural effusion. Associated left basilar opacity may reflect atelectasis and/or infiltrate or edema. Electronically Signed   By: Jeannine Boga M.D.   On: 04/27/2016 04:25   Dg Chest Port 1 View  Result Date: 04/26/2016 CLINICAL DATA:  Confirm central line placement EXAM:  PORTABLE CHEST 1 VIEW COMPARISON:  Yesterday FINDINGS: New right upper extremity PICC with tip at the upper cavoatrial junction. Dual-chamber pacer leads from the left are in stable position. Cardiomegaly with generalized interstitial coarsening and small left effusion. IMPRESSION: 1. New right upper extremity PICC with tip at the upper cavoatrial junction. 2. Pulmonary edema and small left effusion. Electronically Signed   By: Monte Fantasia M.D.   On: 04/26/2016 13:08   Dg Chest Port 1 View  Result Date: 04/26/2016 CLINICAL DATA:  Respiratory failure. EXAM: PORTABLE CHEST 1 VIEW COMPARISON:  04/25/2016 FINDINGS: Cardiac pacemaker. Slightly shallow inspiration. Heart size and pulmonary vascularity are normal. Infiltration in the left lower lung is again demonstrated consistent with pneumonia. No significant progression, lying for differences in technique. Right lung is clear. Calcification of the aorta. No pneumothorax. Degenerative changes in the spine and shoulders. IMPRESSION: Airspace infiltration in the left lower lung consistent with pneumonia. No significant progression. Electronically Signed   By: Lucienne Capers M.D.   On: 04/26/2016 00:27   Dg Abd Portable 1 View  Result Date: 04/25/2016 CLINICAL DATA:  RIGHT upper quadrant pain for 5 days EXAM: PORTABLE ABDOMEN - 1 VIEW COMPARISON:  04/25/2012 FINDINGS: Single air-filled upper normal caliber loops of small bowel in the mid abdomen. Scattered gas and stool throughout colon. No definite bowel dilatation or bowel wall thickening. Numerous vascular stents throughout the RIGHT iliac system with atherosclerotic calcifications of the LEFT iliac system noted. Bones demineralized with degenerative changes lumbar spine. Prior and LEFT acetabular reconstruction and LEFT hip replacement. IMPRESSION: Nonspecific bowel gas pattern. Electronically Signed   By: Lavonia Dana M.D.   On: 04/25/2016 16:47   Dg Swallowing Func-speech Pathology  Result Date:  04/27/2016 Objective Swallowing Evaluation: Type of Study: MBS-Modified Barium Swallow Study Patient Details Name: PATRICIA FARGO MRN: [id number] Date of Birth: 04/30/1931 Today's Date: 04/27/2016 Time: SLP Start Time (ACUTE ONLY): 1315-SLP Stop Time (ACUTE ONLY): 1340 SLP Time Calculation (min) (ACUTE ONLY): 25 min Past Medical History: Past Medical History: Diagnosis Date . Acute blood  loss anemia 04/20/2012 . AF (atrial fibrillation) ([place])    AV ablation 9/09 [place] per Dr Ola Spurr - AV node ablation 9/11 Dr Caryl Comes . Amiodarone pulmonary toxicity  . Asthma  . Bronchitis   hx of  . [name] (coronary artery disease)   (not sure of this 11/10 . CHF (congestive heart failure) ([place])  . Chronic renal insufficiency, stage III (moderate)   CrCl about 60 ml/min . CKD (chronic kidney disease) stage 3, GFR 30-59 ml/min  . Complication of anesthesia   "psycotic episode" after hip surg - resolved . COPD (chronic obstructive pulmonary disease) ([facility])   emphysema -FeV1 73% DLCO 53% 5/09 . CVA (cerebral vascular accident) ([place])   no residual effects evident to pt  . Depression 06/06/2012 . Diabetes ([place])  . Diastolic heart failure   Acute on Chronic . Eczema  . GERD (gastroesophageal reflux disease)  . History of skin cancer  . Hx of cardiovascular stress test   Lexiscan Myoview (09/2013):  No definite ischemia, EF 67%; low risk . Hyperlipidemia  . Invasive ductal carcinoma of breast ([place]) 2011  LEFT  . Mental disorder  . OA (osteoarthritis) of knee   RIGHT . Pacemaker   Permanent . PAD (peripheral artery disease) (HCC)   w/hx right iliac/SFA stenting and left and rt leg PTA . Pain   pt states has pain in fingertips per right hand pt states has been told may be carpal tunnel . Pneumonia   hx of  . Right sided sciatica  . Shortness of breath dyspnea   walking distances  . Sinoatrial node dysfunction (HCC)  . Sleep apnea   associated with hypersomnia uses O2 2L/M at night and during naps also uses CPAP . Tobacco abuse  . Tremors of  nervous system   hands bilat  Past Surgical History: Past Surgical History: Procedure Laterality Date . ATRIAL ABLATION SURGERY    x 2 - "did not help - had to have pacemaker" . BREAST LUMPECTOMY    left breast . CARDIAC CATHETERIZATION   . CATARACT EXTRACTION, BILATERAL    with IOL/Dr Katy Fitch . EP IMPLANTABLE DEVICE N/A 02/13/2015  Procedure: PPM Generator Changeout-St. Jude device;  Surgeon: Deboraha Sprang, MD;  Location: [facility];  Service: Cardiovascular;  Laterality: N/A; . MASTECTOMY, PARTIAL  02/03/2010  Left/Dr Rosenbower . ORIF ACETABULAR FRACTURE Left 04/19/2012  Procedure: OPEN REDUCTION INTERNAL FIXATION (ORIF) ACETABULAR FRACTURE;  Surgeon: Rozanna Box, MD;  Location: [facility];  Service: Orthopedics;  Laterality: Left; . sun spot removed     forhead . TOTAL HIP ARTHROPLASTY Left 10/31/2014  Procedure: LEFT TOTAL HIP ARTHROPLASTY POSTERIOR  APPROACH;  Surgeon: Gaynelle Arabian, MD;  Location: [facility];  Service: Orthopedics;  Laterality: Left; . TOTAL KNEE ARTHROPLASTY Right 10/16/2013  Procedure: RIGHT TOTAL KNEE ARTHROPLASTY;  Surgeon: Gearlean Alf, MD;  Location: [facility];  Service: Orthopedics;  Laterality: Right; Marland Kitchen VASCULAR SURGERY    both legs  HPI: Ms. Esterline is a pleasant 83 y/o woman with a complex PMHx most notable for COPD and AF who presented to the ED with PNA. She he significant hypotension and was brought to the ICU for BiPAP and for peripheral pressors. concern for HCAP, aspiration. Pt observed to cough with RN No Data Recorded Assessment / Plan / Recommendation CHL IP CLINICAL IMPRESSIONS 04/27/2016 Clinical Impression Pt demonstrates a mild dysphagia, likely respiratory based, characterized by slighty delayed, incomplete laryngeal closure during the swallow. This results in flash penetration before/during the swallow and trace  frank penetration and aspiration when taking larger sips or consecutive sips.  Inconsistent sensation noted. Advised pt to continue current diet (regular/thin)  with particular precaution to take small, single sips, avoid straws and take meds whole in puree. Pt agreeable to plan.  May be beneficial to encourage intermittent throat clearing as well. Will f/u for tolerance.  SLP Visit Diagnosis Dysphagia, pharyngeal phase (R13.13) Attention and concentration deficit following -- Frontal lobe and executive function deficit following -- Impact on safety and function Mild aspiration risk   CHL IP TREATMENT RECOMMENDATION 04/27/2016 Treatment Recommendations Therapy as outlined in treatment plan below   Prognosis 04/27/2016 Prognosis for Safe Diet Advancement Good Barriers to Reach Goals -- Barriers/Prognosis Comment -- CHL IP DIET RECOMMENDATION 04/27/2016 SLP Diet Recommendations Regular solids;Thin liquid Liquid Administration via Cup;No straw Medication Administration Whole meds with puree Compensations Slow rate;Small sips/bites Postural Changes Seated upright at 90 degrees   CHL IP OTHER RECOMMENDATIONS 04/27/2016 Recommended Consults -- Oral Care Recommendations Oral care BID Other Recommendations --   CHL IP FOLLOW UP RECOMMENDATIONS 04/27/2016 Follow up Recommendations 24 hour supervision/assistance   CHL IP FREQUENCY AND DURATION 04/27/2016 Speech Therapy Frequency (ACUTE ONLY) min 2x/week Treatment Duration 1 week      CHL IP ORAL PHASE 04/27/2016 Oral Phase WFL Oral - Pudding Teaspoon -- Oral - Pudding Cup -- Oral - Honey Teaspoon -- Oral - Honey Cup -- Oral - Nectar Teaspoon -- Oral - Nectar Cup -- Oral - Nectar Straw -- Oral - Thin Teaspoon -- Oral - Thin Cup -- Oral - Thin Straw -- Oral - Puree -- Oral - Mech Soft -- Oral - Regular -- Oral - Multi-Consistency -- Oral - Pill -- Oral Phase - Comment --  CHL IP PHARYNGEAL PHASE 04/27/2016 Pharyngeal Phase Impaired Pharyngeal- Pudding Teaspoon -- Pharyngeal -- Pharyngeal- Pudding Cup -- Pharyngeal -- Pharyngeal- Honey Teaspoon -- Pharyngeal -- Pharyngeal- Honey Cup -- Pharyngeal -- Pharyngeal- Nectar Teaspoon -- Pharyngeal --  Pharyngeal- Nectar Cup -- Pharyngeal -- Pharyngeal- Nectar Straw -- Pharyngeal -- Pharyngeal- Thin Teaspoon -- Pharyngeal -- Pharyngeal- Thin Cup Penetration/Aspiration before swallow;Penetration/Aspiration during swallow;Reduced airway/laryngeal closure Pharyngeal Material enters airway, CONTACTS cords and then ejected out Pharyngeal- Thin Straw Penetration/Aspiration before swallow;Penetration/Aspiration during swallow;Reduced airway/laryngeal closure;Trace aspiration Pharyngeal Material enters airway, passes BELOW cords then ejected out;Material enters airway, CONTACTS cords and not ejected out;Material enters airway, CONTACTS cords and then ejected out Pharyngeal- Puree WFL Pharyngeal -- Pharyngeal- Mechanical Soft -- Pharyngeal -- Pharyngeal- Regular WFL Pharyngeal -- Pharyngeal- Multi-consistency -- Pharyngeal -- Pharyngeal- Pill Penetration/Aspiration before swallow;Penetration/Aspiration during swallow Pharyngeal Material enters airway, CONTACTS cords and then ejected out Pharyngeal Comment --  No flowsheet data found. No flowsheet data found. Herbie Baltimore, MA CCC-SLP [phone] Lynann Beaver 04/27/2016, 2:14 PM                Assessment & Plan:   Gold stage C. COPD with frequent exacerbations Recent flare with CAP and CHF now appears to be getting back to baseline   Plan  Patient Instructions  Continue on Spiriva daily  Continue on Advair 1 puff Twice daily   Continue on Oxygen 2l/m .  Check chest xray today .  Need to follow up with Cardiology for post hospital follow up . -Dr. Caryl Comes.  Follow up Dr. Vaughan Browner in 2 months and As needed   Please contact office for sooner follow up if symptoms do not improve or worsen or seek emergency care        Diastolic CHF,  chronic ([place]) Recent flare with new onset systolic CHF  Now imrpvoing , appears euvolemic today  Check cxr  Refer to cards for post hospital follow up   Chronic respiratory failure ([place]) Cont on O2 2l/m       Rexene Edison, NP 05/15/2016

## 2016-05-15 NOTE — Assessment & Plan Note (Signed)
Cont on O2 2l/m

## 2016-05-15 NOTE — Assessment & Plan Note (Signed)
Recent flare with new onset systolic CHF  Now imrpvoing , appears euvolemic today  Check cxr  Refer to cards for post hospital follow up

## 2016-05-15 NOTE — Patient Instructions (Addendum)
Continue on Spiriva daily  Continue on Advair 1 puff Twice daily   Continue on Oxygen 2l/m .  Check chest xray today .  Need to follow up with Cardiology for post hospital follow up . -Dr. Caryl Comes.  Follow up Dr. Vaughan Browner in 2 months and As needed   Please contact office for sooner follow up if symptoms do not improve or worsen or seek emergency care

## 2016-05-15 NOTE — Assessment & Plan Note (Signed)
Recent flare with CAP and CHF now appears to be getting back to baseline   Plan  Patient Instructions  Continue on Spiriva daily  Continue on Advair 1 puff Twice daily   Continue on Oxygen 2l/m .  Check chest xray today .  Need to follow up with Cardiology for post hospital follow up . -Dr. Caryl Comes.  Follow up Dr. Vaughan Browner in 2 months and As needed   Please contact office for sooner follow up if symptoms do not improve or worsen or seek emergency care

## 2016-05-18 DIAGNOSIS — I5021 Acute systolic (congestive) heart failure: Secondary | ICD-10-CM | POA: Diagnosis not present

## 2016-05-18 DIAGNOSIS — J189 Pneumonia, unspecified organism: Secondary | ICD-10-CM | POA: Diagnosis not present

## 2016-05-18 DIAGNOSIS — J449 Chronic obstructive pulmonary disease, unspecified: Secondary | ICD-10-CM | POA: Diagnosis not present

## 2016-05-18 DIAGNOSIS — I482 Chronic atrial fibrillation: Secondary | ICD-10-CM | POA: Diagnosis not present

## 2016-05-25 ENCOUNTER — Encounter: Payer: Medicare Other | Admitting: *Deleted

## 2016-05-25 ENCOUNTER — Ambulatory Visit (INDEPENDENT_AMBULATORY_CARE_PROVIDER_SITE_OTHER): Payer: Medicare Other | Admitting: *Deleted

## 2016-05-25 DIAGNOSIS — I442 Atrioventricular block, complete: Secondary | ICD-10-CM | POA: Diagnosis not present

## 2016-05-25 NOTE — Progress Notes (Signed)
Device interrogation only, in place of remote transmission. ROV w/ Device clinic 08/24/16 for device check.

## 2016-05-27 DIAGNOSIS — I482 Chronic atrial fibrillation: Secondary | ICD-10-CM | POA: Diagnosis not present

## 2016-05-27 DIAGNOSIS — E119 Type 2 diabetes mellitus without complications: Secondary | ICD-10-CM | POA: Diagnosis not present

## 2016-05-27 DIAGNOSIS — J189 Pneumonia, unspecified organism: Secondary | ICD-10-CM | POA: Diagnosis not present

## 2016-05-27 DIAGNOSIS — J449 Chronic obstructive pulmonary disease, unspecified: Secondary | ICD-10-CM | POA: Diagnosis not present

## 2016-05-29 DIAGNOSIS — I2510 Atherosclerotic heart disease of native coronary artery without angina pectoris: Secondary | ICD-10-CM | POA: Diagnosis not present

## 2016-05-29 DIAGNOSIS — J449 Chronic obstructive pulmonary disease, unspecified: Secondary | ICD-10-CM | POA: Diagnosis not present

## 2016-05-29 DIAGNOSIS — I10 Essential (primary) hypertension: Secondary | ICD-10-CM | POA: Diagnosis not present

## 2016-05-29 DIAGNOSIS — E119 Type 2 diabetes mellitus without complications: Secondary | ICD-10-CM | POA: Diagnosis not present

## 2016-05-29 DIAGNOSIS — N183 Chronic kidney disease, stage 3 (moderate): Secondary | ICD-10-CM | POA: Diagnosis not present

## 2016-05-29 DIAGNOSIS — I509 Heart failure, unspecified: Secondary | ICD-10-CM | POA: Diagnosis not present

## 2016-05-29 DIAGNOSIS — G4733 Obstructive sleep apnea (adult) (pediatric): Secondary | ICD-10-CM | POA: Diagnosis not present

## 2016-05-29 DIAGNOSIS — F322 Major depressive disorder, single episode, severe without psychotic features: Secondary | ICD-10-CM | POA: Diagnosis not present

## 2016-05-29 DIAGNOSIS — R531 Weakness: Secondary | ICD-10-CM | POA: Diagnosis not present

## 2016-05-29 DIAGNOSIS — I4891 Unspecified atrial fibrillation: Secondary | ICD-10-CM | POA: Diagnosis not present

## 2016-06-04 ENCOUNTER — Other Ambulatory Visit: Payer: Self-pay | Admitting: *Deleted

## 2016-06-04 NOTE — Patient Outreach (Signed)
[facility]) Care Management  06/04/2016  [name] March 16, 1931 [id number]   Spoke with [name], SW at facility. Patient to discharge 06/06/16 to [facility]  No [facility] care management needs noted.  Plan to sign off. [name]. [name], RN, BSN, [place] [phone]) Business Cell  [phone]) Toll Free Office

## 2016-06-04 NOTE — Progress Notes (Signed)
Pt discharged to [facility]. Medication reconciliation completed by PharmD with [place] on 06/04/2016. Pt with Advair on discharge summary but was not started at [facility]. Notified nurse Jarold Song to confirm with MD that medication was not to be started.  Angela Burke, PharmD, BCPS Pharmacy Resident Pager: [phone]

## 2016-06-14 DIAGNOSIS — J440 Chronic obstructive pulmonary disease with acute lower respiratory infection: Secondary | ICD-10-CM | POA: Diagnosis not present

## 2016-06-14 DIAGNOSIS — J189 Pneumonia, unspecified organism: Secondary | ICD-10-CM | POA: Diagnosis not present

## 2016-06-15 DIAGNOSIS — J189 Pneumonia, unspecified organism: Secondary | ICD-10-CM | POA: Diagnosis not present

## 2016-06-15 DIAGNOSIS — J440 Chronic obstructive pulmonary disease with acute lower respiratory infection: Secondary | ICD-10-CM | POA: Diagnosis not present

## 2016-06-16 ENCOUNTER — Telehealth: Payer: Self-pay | Admitting: Internal Medicine

## 2016-06-16 DIAGNOSIS — J440 Chronic obstructive pulmonary disease with acute lower respiratory infection: Secondary | ICD-10-CM | POA: Diagnosis not present

## 2016-06-16 DIAGNOSIS — J189 Pneumonia, unspecified organism: Secondary | ICD-10-CM | POA: Diagnosis not present

## 2016-06-16 NOTE — Telephone Encounter (Signed)
LVM with verbal ok and to call back if she has any questions

## 2016-06-16 NOTE — Telephone Encounter (Signed)
Add a medical social worker to see what she may quality for for free for home assistance and add speech therapists consultant to help understand how to use oxygen.   Needs verbals  Ena Dawley  [phone]

## 2016-06-16 NOTE — Telephone Encounter (Signed)
fine

## 2016-06-17 ENCOUNTER — Encounter: Payer: Self-pay | Admitting: Internal Medicine

## 2016-06-17 ENCOUNTER — Ambulatory Visit (INDEPENDENT_AMBULATORY_CARE_PROVIDER_SITE_OTHER)
Admission: RE | Admit: 2016-06-17 | Discharge: 2016-06-17 | Disposition: A | Discharge status: Home / Self Care | Payer: Medicare Other | Source: Ambulatory Visit | Attending: Internal Medicine | Admitting: Internal Medicine

## 2016-06-17 ENCOUNTER — Ambulatory Visit (INDEPENDENT_AMBULATORY_CARE_PROVIDER_SITE_OTHER): Payer: Medicare Other | Admitting: Internal Medicine

## 2016-06-17 VITALS — BP 124/76 | HR 80 | Temp 98.6°F | Resp 14 | Ht 65.0 in | Wt 164.0 lb

## 2016-06-17 DIAGNOSIS — J189 Pneumonia, unspecified organism: Secondary | ICD-10-CM

## 2016-06-17 DIAGNOSIS — J181 Lobar pneumonia, unspecified organism: Secondary | ICD-10-CM

## 2016-06-17 DIAGNOSIS — R0602 Shortness of breath: Secondary | ICD-10-CM

## 2016-06-17 DIAGNOSIS — E118 Type 2 diabetes mellitus with unspecified complications: Secondary | ICD-10-CM

## 2016-06-17 DIAGNOSIS — J9611 Chronic respiratory failure with hypoxia: Secondary | ICD-10-CM | POA: Diagnosis not present

## 2016-06-17 DIAGNOSIS — J440 Chronic obstructive pulmonary disease with acute lower respiratory infection: Secondary | ICD-10-CM | POA: Diagnosis not present

## 2016-06-17 NOTE — Progress Notes (Signed)
   Subjective:    Patient ID: [name], female    DOB: June 01, 1931, 83 y.o.   MRN: [id number]  HPI The patient is an 83 YO female coming in for hospital and rehab follow up (in hospital in February for sepsis and ARF with pneumonia, she was treated with pressors for BP support and antibiotics as well as breathing treatments, went to rehab afterwards which she did not like well). She has gone home in the last several days and is happy to be home. Still some weak from the hospital. She denies falls since being home. Breathing is stable, no chest pains, nausea, vomiting, constipation, diarrhea. She is having some confusion regarding her oxygen at home and home health is working with her but this is frustrating her as well. She is also concerned that she is not able to see her lung specialist until mid May.   PMH, [facility], social history reviewed and updated.   Review of Systems  Constitutional: Positive for activity change and fatigue. Negative for appetite change, chills, fever and unexpected weight change.  HENT: Negative.   Eyes: Negative.   Respiratory: Positive for cough and shortness of breath. Negative for chest tightness.   Cardiovascular: Negative for chest pain, palpitations and leg swelling.  Gastrointestinal: Negative for abdominal distention, abdominal pain, constipation, diarrhea, nausea and vomiting.  Musculoskeletal: Positive for arthralgias. Negative for back pain, gait problem, myalgias, neck pain and neck stiffness.  Skin: Negative.   Neurological: Positive for weakness. Negative for dizziness, syncope, facial asymmetry, speech difficulty, light-headedness and numbness.  Psychiatric/Behavioral: Negative.       Objective:   Physical Exam  Constitutional: She is oriented to person, place, and time. She appears well-developed and well-nourished.  Frail appearing  HENT:  Head: Normocephalic and atraumatic.  Eyes: EOM are normal.  Neck: Normal range of motion. No JVD  present.  Cardiovascular: Normal rate and regular rhythm.   Pulmonary/Chest: Effort normal. No respiratory distress. She has no wheezes.  Scattered rhonchi which partially clears with cough, good airflow.  Abdominal: Soft. She exhibits no distension. There is no tenderness. There is no rebound.  Musculoskeletal: She exhibits no edema.  Lymphadenopathy:    She has no cervical adenopathy.  Neurological: She is alert and oriented to person, place, and time. Coordination abnormal.  Uses wheeled walker  Skin: Skin is warm and dry.    Vitals:   06/17/16 0908  BP: 124/76  Pulse: 80  Resp: 14  Temp: 98.6 F (37 C)  TempSrc: Oral  SpO2: 96%  Weight: 164 lb (74.4 kg)  Height: '5\' 5"'$  (1.651 m)      Assessment & Plan:

## 2016-06-17 NOTE — Progress Notes (Signed)
Pre visit review using our clinic review tool, if applicable. No additional management support is needed unless otherwise documented below in the visit note. 

## 2016-06-17 NOTE — Patient Instructions (Addendum)
We will have you take only 1/2 pill of the fluid pill called lasix (also called furosemide).   You can schedule the wheelchair visit and that has to be the only thing we talk about at the visit otherwise medicare will not approve the wheelchair.

## 2016-06-17 NOTE — Assessment & Plan Note (Signed)
Checking CXR for resolution today.

## 2016-06-17 NOTE — Assessment & Plan Note (Signed)
Taking metformin for the diabetes without complications and no diarrhea symptoms.

## 2016-06-17 NOTE — Assessment & Plan Note (Signed)
Needs follow up with pulmonary sooner than 1 month and will message her provider to see if they can move up the visit. She is stable and using oxygen and needs more teaching with home health which was approved.

## 2016-06-17 NOTE — Assessment & Plan Note (Signed)
Using oxygen and needs follow up with pulmonary for some concerns about her long term breathing situation.

## 2016-06-18 DIAGNOSIS — J440 Chronic obstructive pulmonary disease with acute lower respiratory infection: Secondary | ICD-10-CM | POA: Diagnosis not present

## 2016-06-18 DIAGNOSIS — J189 Pneumonia, unspecified organism: Secondary | ICD-10-CM | POA: Diagnosis not present

## 2016-06-19 DIAGNOSIS — J440 Chronic obstructive pulmonary disease with acute lower respiratory infection: Secondary | ICD-10-CM | POA: Diagnosis not present

## 2016-06-19 DIAGNOSIS — J189 Pneumonia, unspecified organism: Secondary | ICD-10-CM | POA: Diagnosis not present

## 2016-06-22 ENCOUNTER — Ambulatory Visit (INDEPENDENT_AMBULATORY_CARE_PROVIDER_SITE_OTHER): Payer: Medicare Other | Admitting: Internal Medicine

## 2016-06-22 ENCOUNTER — Telehealth: Payer: Self-pay | Admitting: Internal Medicine

## 2016-06-22 DIAGNOSIS — J440 Chronic obstructive pulmonary disease with acute lower respiratory infection: Secondary | ICD-10-CM | POA: Diagnosis not present

## 2016-06-22 DIAGNOSIS — R2681 Unsteadiness on feet: Secondary | ICD-10-CM | POA: Diagnosis not present

## 2016-06-22 DIAGNOSIS — J189 Pneumonia, unspecified organism: Secondary | ICD-10-CM | POA: Diagnosis not present

## 2016-06-22 DIAGNOSIS — R296 Repeated falls: Secondary | ICD-10-CM

## 2016-06-22 NOTE — Assessment & Plan Note (Signed)
Evaluation done for power wheelchair and she appears to qualify. Papers signed and filled out and will be [facility]ed to [facility] for assistance in obtaining this.

## 2016-06-22 NOTE — Assessment & Plan Note (Signed)
Still with falls even with her aid of wheeled walker with seat, she does need to ambulate multiple times per day to the dining hall which is a significant distance away and she is not able to ambulate that far safely.

## 2016-06-22 NOTE — Telephone Encounter (Signed)
Yes, we did contact them already and they should be in touch with her.

## 2016-06-22 NOTE — Progress Notes (Signed)
   Subjective:    Patient ID: [name], female    DOB: 03-23-1931, 83 y.o.   MRN: [id number]  HPI The patient is an 83 YO female coming in for the purpose of discussion about power wheelchair mobility.   She has undergone PT evaluation and deemed to be a good candidate for power wheelchair. I have personally read and agree with the PT evaluation of the patient.   The patient is not stable with ambulation and assistance with cane or walker is not enough to correct her problems. She has had several falls in the past year due to instability of balance and COPD with recurrent exacerbations.  She is not able to self propel a manual wheelchair and a power scooter is not enough to correct her balance problems.   She does have mental capacity to operate a power wheelchair safely and there are no cognitive concerns.   She will use this to assist in her ADLs of self feeding and preparing meals. She does need to ambulate a significant distance to the dining hall to feed herself which she is not able to do currently due to her gait instability.   PMH, [facility], social history reviewed and updated.   Review of Systems  Constitutional: Positive for activity change and fatigue. Negative for appetite change, chills, fever and unexpected weight change.  HENT: Negative.   Eyes: Negative.   Respiratory: Negative.   Cardiovascular: Negative.   Gastrointestinal: Negative.   Musculoskeletal: Positive for arthralgias and gait problem. Negative for back pain, joint swelling, myalgias and neck pain.  Skin: Negative.   Neurological: Positive for dizziness and weakness. Negative for facial asymmetry, speech difficulty, light-headedness, numbness and headaches.  Psychiatric/Behavioral: Negative.       Objective:   Physical Exam  Constitutional: She is oriented to person, place, and time. She appears well-developed and well-nourished.  thin  HENT:  Head: Normocephalic and atraumatic.  Eyes: EOM are  normal.  Neck: Normal range of motion.  Cardiovascular: Normal rate and regular rhythm.   Pulmonary/Chest: Effort normal and breath sounds normal. No respiratory distress. She has no wheezes. She has no rales.  Abdominal: Soft. Bowel sounds are normal. She exhibits no distension. There is no tenderness. There is no rebound.  Musculoskeletal: She exhibits no edema.  Neurological: She is alert and oriented to person, place, and time. Coordination abnormal.  Wheeled walker and gait still unsteady with aid  Skin: Skin is warm and dry.  Psychiatric: She has a normal mood and affect.   Vitals:   06/22/16 0937  Pulse: 85  Resp: 16  Temp: 97.9 F (36.6 C)  TempSrc: Oral  SpO2: 97%  Weight: 160 lb (72.6 kg)  Height: '5\' 5"'$  (1.651 m)      Assessment & Plan:  Visit time 25 minutes: greater than 50% of that time was spent in face to face with the patient discussing her mobility and reviewing her PT evaluation form as well as discussing balance and mobility modifications and why they would not be appropriate for her.

## 2016-06-22 NOTE — Telephone Encounter (Signed)
Pt came by front desk after her visit with you and wanted to ask if you were going to contact Pulmonary to see if her visit with Dr. Vaughan Browner could get moved up sooner than next month.  Please let pt know.

## 2016-06-22 NOTE — Progress Notes (Signed)
Pre visit review using our clinic review tool, if applicable. No additional management support is needed unless otherwise documented below in the visit note. 

## 2016-06-22 NOTE — Patient Instructions (Signed)
We will fill out the papers today and send them back to [facility] for the wheelchair.

## 2016-06-23 ENCOUNTER — Telehealth: Payer: Self-pay | Admitting: Internal Medicine

## 2016-06-23 DIAGNOSIS — J440 Chronic obstructive pulmonary disease with acute lower respiratory infection: Secondary | ICD-10-CM | POA: Diagnosis not present

## 2016-06-23 DIAGNOSIS — J189 Pneumonia, unspecified organism: Secondary | ICD-10-CM | POA: Diagnosis not present

## 2016-06-23 NOTE — Telephone Encounter (Signed)
Pt was prescribed furosemide (LASIX) 40 MG tablet in hosp do you want Pt to continue taking this? If so she needs a refill. Please send to [facility] mail order.    94 O2 level with O2 on at rest  RN from [employer] [phone]

## 2016-06-23 NOTE — Telephone Encounter (Signed)
I would not recommend for long term use.

## 2016-06-23 NOTE — Telephone Encounter (Signed)
Rn notified

## 2016-06-24 DIAGNOSIS — J440 Chronic obstructive pulmonary disease with acute lower respiratory infection: Secondary | ICD-10-CM | POA: Diagnosis not present

## 2016-06-24 DIAGNOSIS — J189 Pneumonia, unspecified organism: Secondary | ICD-10-CM | POA: Diagnosis not present

## 2016-06-26 DIAGNOSIS — J440 Chronic obstructive pulmonary disease with acute lower respiratory infection: Secondary | ICD-10-CM | POA: Diagnosis not present

## 2016-06-26 DIAGNOSIS — J189 Pneumonia, unspecified organism: Secondary | ICD-10-CM | POA: Diagnosis not present

## 2016-06-30 ENCOUNTER — Telehealth: Payer: Self-pay | Admitting: Internal Medicine

## 2016-06-30 DIAGNOSIS — J440 Chronic obstructive pulmonary disease with acute lower respiratory infection: Secondary | ICD-10-CM | POA: Diagnosis not present

## 2016-06-30 DIAGNOSIS — J189 Pneumonia, unspecified organism: Secondary | ICD-10-CM | POA: Diagnosis not present

## 2016-06-30 NOTE — Telephone Encounter (Signed)
Pt called stating she was returning your call. I tried to ask what is was about and she did not know and  I did not find any phone notes.  Please call back.

## 2016-07-01 DIAGNOSIS — J440 Chronic obstructive pulmonary disease with acute lower respiratory infection: Secondary | ICD-10-CM | POA: Diagnosis not present

## 2016-07-01 DIAGNOSIS — J189 Pneumonia, unspecified organism: Secondary | ICD-10-CM | POA: Diagnosis not present

## 2016-07-02 DIAGNOSIS — I5021 Acute systolic (congestive) heart failure: Secondary | ICD-10-CM | POA: Diagnosis not present

## 2016-07-02 DIAGNOSIS — N183 Chronic kidney disease, stage 3 (moderate): Secondary | ICD-10-CM | POA: Diagnosis not present

## 2016-07-02 DIAGNOSIS — J440 Chronic obstructive pulmonary disease with acute lower respiratory infection: Secondary | ICD-10-CM | POA: Diagnosis not present

## 2016-07-02 DIAGNOSIS — E1122 Type 2 diabetes mellitus with diabetic chronic kidney disease: Secondary | ICD-10-CM | POA: Diagnosis not present

## 2016-07-02 DIAGNOSIS — J189 Pneumonia, unspecified organism: Secondary | ICD-10-CM | POA: Diagnosis not present

## 2016-07-02 DIAGNOSIS — Z7984 Long term (current) use of oral hypoglycemic drugs: Secondary | ICD-10-CM

## 2016-07-02 DIAGNOSIS — F329 Major depressive disorder, single episode, unspecified: Secondary | ICD-10-CM | POA: Diagnosis not present

## 2016-07-02 DIAGNOSIS — I4891 Unspecified atrial fibrillation: Secondary | ICD-10-CM | POA: Diagnosis not present

## 2016-07-02 DIAGNOSIS — Z9981 Dependence on supplemental oxygen: Secondary | ICD-10-CM | POA: Diagnosis not present

## 2016-07-02 DIAGNOSIS — Z7901 Long term (current) use of anticoagulants: Secondary | ICD-10-CM | POA: Diagnosis not present

## 2016-07-02 DIAGNOSIS — Z87440 Personal history of urinary (tract) infections: Secondary | ICD-10-CM

## 2016-07-02 DIAGNOSIS — I2510 Atherosclerotic heart disease of native coronary artery without angina pectoris: Secondary | ICD-10-CM | POA: Diagnosis not present

## 2016-07-02 DIAGNOSIS — Z7951 Long term (current) use of inhaled steroids: Secondary | ICD-10-CM

## 2016-07-02 DIAGNOSIS — Z8701 Personal history of pneumonia (recurrent): Secondary | ICD-10-CM

## 2016-07-02 DIAGNOSIS — J9620 Acute and chronic respiratory failure, unspecified whether with hypoxia or hypercapnia: Secondary | ICD-10-CM | POA: Diagnosis not present

## 2016-07-02 DIAGNOSIS — I130 Hypertensive heart and chronic kidney disease with heart failure and stage 1 through stage 4 chronic kidney disease, or unspecified chronic kidney disease: Secondary | ICD-10-CM | POA: Diagnosis not present

## 2016-07-03 DIAGNOSIS — J440 Chronic obstructive pulmonary disease with acute lower respiratory infection: Secondary | ICD-10-CM | POA: Diagnosis not present

## 2016-07-03 DIAGNOSIS — J189 Pneumonia, unspecified organism: Secondary | ICD-10-CM | POA: Diagnosis not present

## 2016-07-06 DIAGNOSIS — J189 Pneumonia, unspecified organism: Secondary | ICD-10-CM | POA: Diagnosis not present

## 2016-07-06 DIAGNOSIS — J440 Chronic obstructive pulmonary disease with acute lower respiratory infection: Secondary | ICD-10-CM | POA: Diagnosis not present

## 2016-07-07 DIAGNOSIS — J189 Pneumonia, unspecified organism: Secondary | ICD-10-CM | POA: Diagnosis not present

## 2016-07-07 DIAGNOSIS — J440 Chronic obstructive pulmonary disease with acute lower respiratory infection: Secondary | ICD-10-CM | POA: Diagnosis not present

## 2016-07-08 DIAGNOSIS — J189 Pneumonia, unspecified organism: Secondary | ICD-10-CM | POA: Diagnosis not present

## 2016-07-08 DIAGNOSIS — J440 Chronic obstructive pulmonary disease with acute lower respiratory infection: Secondary | ICD-10-CM | POA: Diagnosis not present

## 2016-07-09 ENCOUNTER — Telehealth: Payer: Self-pay | Admitting: Internal Medicine

## 2016-07-09 DIAGNOSIS — J440 Chronic obstructive pulmonary disease with acute lower respiratory infection: Secondary | ICD-10-CM | POA: Diagnosis not present

## 2016-07-09 DIAGNOSIS — J189 Pneumonia, unspecified organism: Secondary | ICD-10-CM | POA: Diagnosis not present

## 2016-07-09 NOTE — Telephone Encounter (Signed)
Ok

## 2016-07-09 NOTE — Telephone Encounter (Signed)
Nurse will be discharging her from nurse visits because she will not answer her for door for the last 2 weeks and will not listen to their instructions. has not responded to any instructions.

## 2016-07-10 ENCOUNTER — Encounter: Payer: Self-pay | Admitting: Internal Medicine

## 2016-07-10 ENCOUNTER — Ambulatory Visit (INDEPENDENT_AMBULATORY_CARE_PROVIDER_SITE_OTHER): Payer: Medicare Other | Admitting: Internal Medicine

## 2016-07-10 DIAGNOSIS — J449 Chronic obstructive pulmonary disease, unspecified: Secondary | ICD-10-CM

## 2016-07-10 DIAGNOSIS — J440 Chronic obstructive pulmonary disease with acute lower respiratory infection: Secondary | ICD-10-CM | POA: Diagnosis not present

## 2016-07-10 DIAGNOSIS — J189 Pneumonia, unspecified organism: Secondary | ICD-10-CM | POA: Diagnosis not present

## 2016-07-10 MED ORDER — PREDNISONE 20 MG PO TABS: 40.0000 mg | ORAL_TABLET | Freq: Every day | ORAL | 0 refills | Status: DC

## 2016-07-10 NOTE — Progress Notes (Signed)
   Subjective:    Patient ID: [name], female    DOB: 11/19/1931, 83 y.o.   MRN: [id number]  HPI The patient is an 83 YO female coming in for allergies and nose congestion. They are worsening with the pollen and it is bothering her breathing. She is getting more SOB with doing anything and this is limiting her activity. She is having more cough and sputum production. Still using her oxygen all the time. She is concerned due to multiple exacerbations and hospitalizations. Denies fevers or chills. No headaches. No ear pain. Nose drainage and post nasal drip.   Review of Systems  Constitutional: Positive for activity change and fatigue. Negative for appetite change, chills, fever and unexpected weight change.  Respiratory: Positive for cough, chest tightness and shortness of breath. Negative for wheezing.   Cardiovascular: Negative.   Gastrointestinal: Negative.   Musculoskeletal: Negative.   Skin: Negative.   Neurological: Negative.       Objective:   Physical Exam  Constitutional: She appears well-developed and well-nourished.  Fragile, frail appearing  HENT:  Head: Normocephalic and atraumatic.  Right Ear: External ear normal.  Left Ear: External ear normal.  Oropharynx with redness and clear drainage.   Eyes: EOM are normal. Pupils are equal, round, and reactive to light.  Neck: Normal range of motion.  Cardiovascular: Normal rate and regular rhythm.   Pulmonary/Chest: Effort normal.  With rhonchi and expiratory wheezing more prominent than usual, oxygen at 2L constant.   Abdominal: Soft.  Neurological: She is alert.  Skin: Skin is warm and dry.   Vitals:   07/10/16 1433  Pulse: 86  Resp: 14  Temp: 97.6 F (36.4 C)  TempSrc: Oral  SpO2: 99%  Weight: 162 lb (73.5 kg)  Height: '5\' 5"'$  (1.651 m)      Assessment & Plan:

## 2016-07-10 NOTE — Patient Instructions (Signed)
We have sent in prednisone to help with the breathing and sinuses.   Take 2 pills daily for 1 week.

## 2016-07-10 NOTE — Progress Notes (Signed)
Pre visit review using our clinic review tool, if applicable. No additional management support is needed unless otherwise documented below in the visit note. 

## 2016-07-12 NOTE — Assessment & Plan Note (Signed)
With exacerbation from pollen. Rx for prednisone as antibiotics are not indicated today. She is using her oxygen and inhalers as usual but she is still recovering from last exacerbation and respiratory state is fragile.

## 2016-07-13 DIAGNOSIS — J189 Pneumonia, unspecified organism: Secondary | ICD-10-CM | POA: Diagnosis not present

## 2016-07-13 DIAGNOSIS — D631 Anemia in chronic kidney disease: Secondary | ICD-10-CM | POA: Diagnosis not present

## 2016-07-13 DIAGNOSIS — I739 Peripheral vascular disease, unspecified: Secondary | ICD-10-CM | POA: Diagnosis not present

## 2016-07-13 DIAGNOSIS — Z950 Presence of cardiac pacemaker: Secondary | ICD-10-CM | POA: Diagnosis not present

## 2016-07-13 DIAGNOSIS — I5042 Chronic combined systolic (congestive) and diastolic (congestive) heart failure: Secondary | ICD-10-CM | POA: Diagnosis not present

## 2016-07-13 DIAGNOSIS — E1129 Type 2 diabetes mellitus with other diabetic kidney complication: Secondary | ICD-10-CM | POA: Diagnosis not present

## 2016-07-13 DIAGNOSIS — Z853 Personal history of malignant neoplasm of breast: Secondary | ICD-10-CM | POA: Diagnosis not present

## 2016-07-13 DIAGNOSIS — I129 Hypertensive chronic kidney disease with stage 1 through stage 4 chronic kidney disease, or unspecified chronic kidney disease: Secondary | ICD-10-CM | POA: Diagnosis not present

## 2016-07-13 DIAGNOSIS — J439 Emphysema, unspecified: Secondary | ICD-10-CM | POA: Diagnosis not present

## 2016-07-13 DIAGNOSIS — I480 Paroxysmal atrial fibrillation: Secondary | ICD-10-CM | POA: Diagnosis not present

## 2016-07-13 DIAGNOSIS — N2581 Secondary hyperparathyroidism of renal origin: Secondary | ICD-10-CM | POA: Diagnosis not present

## 2016-07-13 DIAGNOSIS — N183 Chronic kidney disease, stage 3 (moderate): Secondary | ICD-10-CM | POA: Diagnosis not present

## 2016-07-13 DIAGNOSIS — E782 Mixed hyperlipidemia: Secondary | ICD-10-CM | POA: Diagnosis not present

## 2016-07-13 DIAGNOSIS — J440 Chronic obstructive pulmonary disease with acute lower respiratory infection: Secondary | ICD-10-CM | POA: Diagnosis not present

## 2016-07-14 DIAGNOSIS — J189 Pneumonia, unspecified organism: Secondary | ICD-10-CM | POA: Diagnosis not present

## 2016-07-14 DIAGNOSIS — J440 Chronic obstructive pulmonary disease with acute lower respiratory infection: Secondary | ICD-10-CM | POA: Diagnosis not present

## 2016-07-15 DIAGNOSIS — J440 Chronic obstructive pulmonary disease with acute lower respiratory infection: Secondary | ICD-10-CM | POA: Diagnosis not present

## 2016-07-15 DIAGNOSIS — J189 Pneumonia, unspecified organism: Secondary | ICD-10-CM | POA: Diagnosis not present

## 2016-07-16 ENCOUNTER — Other Ambulatory Visit: Payer: Self-pay | Admitting: Internal Medicine

## 2016-07-16 NOTE — Telephone Encounter (Signed)
Please advise 

## 2016-07-17 DIAGNOSIS — J440 Chronic obstructive pulmonary disease with acute lower respiratory infection: Secondary | ICD-10-CM | POA: Diagnosis not present

## 2016-07-17 DIAGNOSIS — J189 Pneumonia, unspecified organism: Secondary | ICD-10-CM | POA: Diagnosis not present

## 2016-07-20 ENCOUNTER — Telehealth: Payer: Self-pay | Admitting: Internal Medicine

## 2016-07-20 DIAGNOSIS — J189 Pneumonia, unspecified organism: Secondary | ICD-10-CM | POA: Diagnosis not present

## 2016-07-20 DIAGNOSIS — J440 Chronic obstructive pulmonary disease with acute lower respiratory infection: Secondary | ICD-10-CM | POA: Diagnosis not present

## 2016-07-20 NOTE — Telephone Encounter (Signed)
Spoke with patient and she stated she is going to fill out a new DPR with the name on it, also patient stated that she stays in her back bedroom and does not hear when home health knocks on door, would still like for them to keep coming.

## 2016-07-20 NOTE — Telephone Encounter (Signed)
Pts daughter-in-law came by with the pt to see about getting a prescription for a portable oxygen concentrator for higher oxygen content from [facility]. She said that tanks are not working well. She is not able to put up the tanks and her husband has dementia so he does not remember filling them or does not turn them on to fill them. Can this be sent in for her? Please advise.

## 2016-07-20 NOTE — Telephone Encounter (Signed)
We cannot give out any health information or talk to her about the patient if not on DPR. If the patient wants her to be informed she can sign new DPR form. We would be happy to talk to the patient to see if she has issues. It would appear that home health has been trying to go to the home and she refuses to answer door per previous phone note.

## 2016-07-20 NOTE — Telephone Encounter (Signed)
Patients daughter in law called wanting to talk to Dr. Sharlet Salina as a family member. Traci was inform she is not on the DPR.   She just wanted [name] to send an ORDER to go to [facility] for a protal O2 tank. She states she spoke with [facility] about this already. They informed her that need a order from patient pcp's.  She states the family is having an issues getting them filled. She believes that her father in law just isn't doing something right when he fills them. She states he has dementia. Patient can not fill them herself because of her shoulder.   Please advise. Daughter in law would like a call back or at less follow up with [facility] on the issues. Thank you.

## 2016-07-20 NOTE — Telephone Encounter (Signed)
LVM with daughter in law stating we are unable to give out any information since she is not on patients DPR, that I will be more than happy to contact patient and see if she wants to sign a new DPR and to check how things are going

## 2016-07-20 NOTE — Telephone Encounter (Signed)
Please advise 

## 2016-07-21 DIAGNOSIS — J440 Chronic obstructive pulmonary disease with acute lower respiratory infection: Secondary | ICD-10-CM | POA: Diagnosis not present

## 2016-07-21 DIAGNOSIS — J189 Pneumonia, unspecified organism: Secondary | ICD-10-CM | POA: Diagnosis not present

## 2016-07-21 NOTE — Telephone Encounter (Signed)
We can call patient back (cannot call DIL if not on DPR) and let her know that we would recommend to go to her pulmonary visit on the 21st (less than 1 week away) as I do not believe her oxygen is meant to be long term. This was ordered in the hospital as a temporary measure until her severe illness cleared. She may even be able to stop using with exertion after the pulmonary visit. If pulmonary thinks she will need oxygen long term then they can order this at the visit.

## 2016-07-21 NOTE — Telephone Encounter (Signed)
LVM with son explaining recommendation and to call back if he has any questions

## 2016-07-22 DIAGNOSIS — J440 Chronic obstructive pulmonary disease with acute lower respiratory infection: Secondary | ICD-10-CM | POA: Diagnosis not present

## 2016-07-22 DIAGNOSIS — J189 Pneumonia, unspecified organism: Secondary | ICD-10-CM | POA: Diagnosis not present

## 2016-07-23 DIAGNOSIS — J189 Pneumonia, unspecified organism: Secondary | ICD-10-CM | POA: Diagnosis not present

## 2016-07-23 DIAGNOSIS — J440 Chronic obstructive pulmonary disease with acute lower respiratory infection: Secondary | ICD-10-CM | POA: Diagnosis not present

## 2016-07-24 ENCOUNTER — Encounter: Payer: Medicare Other | Admitting: Adult Health

## 2016-07-24 DIAGNOSIS — J440 Chronic obstructive pulmonary disease with acute lower respiratory infection: Secondary | ICD-10-CM | POA: Diagnosis not present

## 2016-07-24 DIAGNOSIS — J189 Pneumonia, unspecified organism: Secondary | ICD-10-CM | POA: Diagnosis not present

## 2016-07-27 ENCOUNTER — Ambulatory Visit (INDEPENDENT_AMBULATORY_CARE_PROVIDER_SITE_OTHER)
Admission: RE | Admit: 2016-07-27 | Discharge: 2016-07-27 | Disposition: A | Discharge status: Home / Self Care | Payer: Medicare Other | Source: Ambulatory Visit | Attending: Pulmonary Disease | Admitting: Pulmonary Disease

## 2016-07-27 ENCOUNTER — Ambulatory Visit (INDEPENDENT_AMBULATORY_CARE_PROVIDER_SITE_OTHER): Payer: Medicare Other | Admitting: Pulmonary Disease

## 2016-07-27 ENCOUNTER — Encounter: Payer: Self-pay | Admitting: Adult Health

## 2016-07-27 ENCOUNTER — Encounter: Payer: Self-pay | Admitting: Pulmonary Disease

## 2016-07-27 ENCOUNTER — Ambulatory Visit (HOSPITAL_BASED_OUTPATIENT_CLINIC_OR_DEPARTMENT_OTHER): Payer: Medicare Other | Admitting: Adult Health

## 2016-07-27 VITALS — BP 112/60 | HR 102 | Ht 65.5 in | Wt 161.6 lb

## 2016-07-27 VITALS — BP 111/44 | HR 72 | Temp 98.2°F | Resp 17 | Ht 65.0 in | Wt 162.8 lb

## 2016-07-27 DIAGNOSIS — M810 Age-related osteoporosis without current pathological fracture: Secondary | ICD-10-CM | POA: Diagnosis not present

## 2016-07-27 DIAGNOSIS — Z853 Personal history of malignant neoplasm of breast: Secondary | ICD-10-CM | POA: Diagnosis not present

## 2016-07-27 DIAGNOSIS — G4733 Obstructive sleep apnea (adult) (pediatric): Secondary | ICD-10-CM

## 2016-07-27 DIAGNOSIS — C50212 Malignant neoplasm of upper-inner quadrant of left female breast: Secondary | ICD-10-CM

## 2016-07-27 DIAGNOSIS — J449 Chronic obstructive pulmonary disease, unspecified: Secondary | ICD-10-CM

## 2016-07-27 DIAGNOSIS — J441 Chronic obstructive pulmonary disease with (acute) exacerbation: Secondary | ICD-10-CM | POA: Diagnosis not present

## 2016-07-27 NOTE — Patient Instructions (Signed)
Oxygen levels dropped during exercise today. We will prescribe oxygen at 2 L. We will order a portable concentrator Continue using the CPAP. We will add O2 to CPAP Continue using the inhalers You can use OTC mucinex for congestion We will get a CXR today to make sure there is no new pneumonia.   Return to clinic in 1-2 months.

## 2016-07-27 NOTE — Progress Notes (Signed)
[name]    [id number]    28-Oct-1931  Primary Care Physician:Crawford, Real Cons, MD  Referring Physician: Hoyt Koch, MD [place], [place] [id number]  Chief complaint:   Follow up for COPD GOLD C Heavy ex smoker OSA on CPAP with 2Lt O2  HPI: [name] has history of COPD with frequent exacerbations in the past. She is a former patient of [name] and a heavy former smoker. She smoked one and half to 3 packs per day for nearly 60 years. She quit 2013.  She underwent right hip and total knee replacement in the past. She has apparently tolerated these procedures well. She has history of atrial fibrillation and failed several ablations. She has a permanent pacemaker. 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 She is not on amiodarone at present.  Interim History: [name] was hospitalized in Feb 2018 with left lower lobe community-acquired pneumonia, sepsis, acute systolic heart failure, AKI. She was initially in the ICU requiring pressor, Bipap support. Echo showed EF 25-30 percent and she had elevated troponins which was felt to be secondary to demand ischemia. Seen By Rexene Edison, NP post hospital discharge on 3/9.  Since her discharge she is making a slow recovery. She continues on supplemental oxygen during day and at night. She is unable to use the CPAP at night as oxygen has not been fitted to the CPAP. She reports some mild cough and congestion for the past few weeks. No fevers, chills or worsening of dyspnea. She is frustrated that she is not able to be seen in clinic earlier.  Outpatient Encounter Prescriptions as of 07/27/2016  Medication Sig  . acetaminophen (TYLENOL) 650 MG CR tablet Take 1,300 mg by mouth every 8 (eight) hours as needed for pain.  Marland Kitchen AMITIZA 24 MCG capsule TAKE 1 CAPSULE TWICE DAILY WITH A MEAL  .  atorvastatin (LIPITOR) 20 MG tablet TAKE 1 TABLET EVERY DAY  . B-D ULTRA-FINE 33 LANCETS MISC Use to help check blood sugars daily Dx E11.9  . Biotin (BIOTIN 5000) 5 MG CAPS Take 5 mg by mouth at bedtime.   Marland Kitchen ELIQUIS 5 MG TABS tablet TAKE 1 TABLET TWICE DAILY  (MUST  ESTABLISH  NEW  PRIMARY CARE PROVIDER  FOR FUTURE REFILLS)  . Fluticasone-Salmeterol (ADVAIR) 250-50 MCG/DOSE AEPB Inhale 1 puff into the lungs 2 (two) times daily.  . furosemide (LASIX) 40 MG tablet TAKE ONE TABLET BY MOUTH ONCE DAILY  . glucose blood (BAYER CONTOUR TEST) test strip 1 each by Other route daily. Use to check blood sugars every day Dx E11.9  . guaiFENesin (MUCINEX) 600 MG 12 hr tablet Take 1 tablet (600 mg total) by mouth 2 (two) times daily.  Marland Kitchen levalbuterol (XOPENEX HFA) 45 MCG/ACT inhaler Inhale 2 puffs into the lungs every 6 (six) hours as needed. Reported on 09/23/2015  . levalbuterol (XOPENEX) 0.63 MG/3ML nebulizer solution Take 3 mLs (0.63 mg total) by nebulization every 6 (six) hours as needed for wheezing or shortness of breath.  . losartan (COZAAR) 25 MG tablet Take 0.5 tablets (12.5 mg total) by mouth daily.  . metFORMIN (GLUCOPHAGE) 500 MG tablet TAKE 1 TABLET TWICE DAILY WITH A MEAL  . montelukast (SINGULAIR) 10 MG tablet Take 1 tablet (10 mg total) by mouth at bedtime.  . Multiple Vitamins-Minerals (MULTIVITAMIN WITH MINERALS)  tablet Take 1 tablet by mouth 2 (two) times daily.   . potassium chloride SA (K-DUR,KLOR-CON) 20 MEQ tablet Take 1 tablet (20 mEq total) by mouth daily. 4mq in am, 457m in pm  . tiotropium (SPIRIVA HANDIHALER) 18 MCG inhalation capsule INHALE THE CONTENTS OF 1 CAPSULE EVERY DAY  . [DISCONTINUED] predniSONE (DELTASONE) 20 MG tablet Take 2 tablets (40 mg total) by mouth daily with breakfast.   No facility-administered encounter medications on file as of 07/27/2016.     Allergies as of 07/27/2016 - Review Complete 07/27/2016  Allergen Reaction Noted  . Cholestatin Other (See  Comments) 09/02/2010  . Sulfur Itching 01/13/2013    Past Medical History:  Diagnosis Date  . Acute blood loss anemia 04/20/2012  . AF (atrial fibrillation) ([facility]    [facility] Fi[facility]la Spurr AV node ablation 9/11 Dr KlCaryl Comes. Amiodarone pulmonary toxicity   . Asthma   . Bronchitis    hx of   . [name] (coronary artery disease)    (not sure of this 11/10  . CHF (congestive heart failure) ([facility]  . Chronic renal insufficiency, stage III (moderate)    CrCl about 60 ml/min  . CKD (chronic kidney disease) stage 3, GFR 30-59 ml/min   . Complication of anesthesia    "psycotic episode" after hip surg - resolved  . COPD (chronic obstructive pulmonary disease) (HCC)    emphysema -FeV1 73% DLCO 53% 5/09  . CVA (cerebral vascular accident) ([facility]   no residual effects evident to pt   . Depression 06/06/2012  . Diabetes ([facility]  . Diastolic heart failure    Acute on Chronic  . Eczema   . GERD (gastroesophageal reflux disease)   . History of skin cancer   . Hx of cardiovascular stress test    Lexiscan Myoview (09/2013):  No definite ischemia, EF 67%; low risk  . Hyperlipidemia   . Invasive ductal carcinoma of breast ([facility]2011   LEFT   . Mental disorder   . OA (osteoarthritis) of knee    RIGHT  . Pacemaker    Permanent  . PAD (peripheral artery disease) (HCC)    w/hx right iliac/SFA stenting and left and rt leg PTA  . Pain    pt states has pain in fingertips per right hand pt states has been told may be carpal tunnel  . Pneumonia    hx of   . Right sided sciatica   . Shortness of breath dyspnea    walking distances   . Sinoatrial node dysfunction (HCC)   . Sleep apnea    associated with hypersomnia uses O2 2L/M at night and during naps also uses CPAP  . Tobacco abuse   . Tremors of nervous system    hands bilat     Past Surgical History:  Procedure Laterality Date  . ATRIAL ABLATION SURGERY     x 2 - "did not help - had to have pacemaker"  . BREAST  LUMPECTOMY     left breast  . CARDIAC CATHETERIZATION    . CATARACT EXTRACTION, BILATERAL     with IOL/Dr GrKaty Fitch. EP IMPLANTABLE DEVICE N/A 02/13/2015   Procedure: PPM Generator Changeout-St. Jude device;  Surgeon: StDeboraha SprangMD;  [facility];  Service: Cardiovascular;  Laterality: N/A;  . MASTECTOMY, PARTIAL  02/03/2010   Left/Dr Rosenbower  . ORIF ACETABULAR FRACTURE Left 04/19/2012   Procedure: OPEN REDUCTION INTERNAL FIXATION (ORIF) ACETABULAR  FRACTURE;  Surgeon: Rozanna Box, MD;  Location: [facility];  Service: Orthopedics;  Laterality: Left;  . sun spot removed      forhead  . TOTAL HIP ARTHROPLASTY Left 10/31/2014   Procedure: LEFT TOTAL HIP ARTHROPLASTY POSTERIOR  APPROACH;  Surgeon: Gaynelle Arabian, MD;  [facility];  Service: Orthopedics;  Laterality: Left;  . TOTAL KNEE ARTHROPLASTY Right 10/16/2013   Procedure: RIGHT TOTAL KNEE ARTHROPLASTY;  Surgeon: Gearlean Alf, MD;  [facility];  Service: Orthopedics;  Laterality: Right;  Marland Kitchen VASCULAR SURGERY     both legs     Family History  Problem Relation Age of Onset  . Heart disease Father     Social History   Social History  . Marital status: Married    Spouse name: N/A  . Number of children: N/A  . Years of education: N/A   Occupational History  . Not on file.   Social History Main Topics  . Smoking status: Former Smoker    Packs/day: 1.00    Years: 55.00    Types: Cigarettes    Quit date: 12/19/2011  . Smokeless tobacco: Never Used     Comment: started smoking at age 87--4 cigs per day.  smoked 2ppd the last 10 yrs  . Alcohol use Yes     Comment: wine (rare)  . Drug use: No  . Sexual activity: No   Other Topics Concern  . Not on file   Social History Narrative   HS Graduate; [employer].  Married '61.  2 sons - '70, '71; Dtrs - '64,'68;    6 grandchildren.  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 Interests -Tai-chi & water aerobics, gardening, active lifestyle.  Marriage a bit stressful - SO w/membory problems and difficult behavior. She denies any personal safety concerns. End of life care: need to address at next OV              Review of systems: Review of Systems  Constitutional: Negative for fever and chills.  HENT: Negative.   Eyes: Negative for blurred vision.  Respiratory: as per HPI  Cardiovascular: Negative for chest pain and palpitations.  Gastrointestinal: Negative for vomiting, diarrhea, blood per rectum. Genitourinary: Negative for dysuria, urgency, frequency and hematuria.  Musculoskeletal: Negative for myalgias, back pain and joint pain.  Skin: Negative for itching and rash.  Neurological: Negative for dizziness, tremors, focal weakness, seizures and loss of consciousness.  Endo/Heme/Allergies: Negative for environmental allergies.  Psychiatric/Behavioral: Negative for depression, suicidal ideas and hallucinations.  All other systems reviewed and are negative.  Physical Exam: Blood pressure 112/60, pulse (!) 102, height 5' 5.5" (1.664 m), weight 161 lb 9.6 oz (73.3 kg), SpO2 90 %. Gen:      No acute distress HEENT:  EOMI, sclera anicteric Neck:     No masses; no thyromegaly Lungs:    Mild crackles, no wheeze; normal respiratory effort CV:         Regular rate and rhythm; no murmurs Abd:      + bowel sounds; soft, non-tender; no palpable masses, no distension Ext:    No edema; adequate peripheral perfusion Skin:      Warm and dry; no rash Neuro: alert and oriented x 3 Psych: normal mood and affect  Data Reviewed: Chest x-ray 04/25/16-left lower lobe airspace consolidation. Chest x-ray 05/15/16-improvement in left lower  lobe consolidation Chest x-ray 06/17/16-hyperinflation, chronic interstitial changes. No acute abnormality I have reviewed all images  personally.   PFTs (02/20/10) FVC 1.66 (56%) FEV1 1.289 (59%) F/F 74   Assessment:  #1 COPD GOLD D Recent admission with CAP, sepsis. She is making a slow recovery but still has some congestion. We will get a CXR today to ensure there are no new infiltrates. I don't believe she will need antibiotics or prednisone today. We will continue current therapy with advair, spiriva, nebs. She can use mucinex for congestion She desatted on ambulation today. Continue supplemental O2 at 2 LPM. We will get a portable concentrator Continue close follow up with return in 1-2 months. If there is a change in symptoms then I have instructed her to call office to be seen earlier.   There is a question of history of amiodarone pulmonary toxicity but lung imaging has been clear and she's been off the amiodarone for many years now. Continue to monitor.   #2 Heavy ex smoker. She quit in 2013. Not a candidate for screening CT due to age.   #3 OSA Not using CPAP as the O2 has not been fitted to be bled in. We will contact the DME company to facilitate this and resume CPAP  #4 Code status DNR/DNI  Plan/Recommendations: - Continue Advair, spiriva - CXR today - Continue supplemental o2. Order portable concentrator - Contact DME to get O2 fitted to CPAP - Mucinex for congetion  Follow up in 1-2 months  Marshell Garfinkel MD [facility] Pager [phone] 07/27/2016, 11:51 AM  CC: Hoyt Koch, *

## 2016-07-27 NOTE — Progress Notes (Signed)
CLINIC:  Survivorship   REASON FOR VISIT:  Routine follow-up for history of breast cancer.   BRIEF ONCOLOGIC HISTORY:    Malignant neoplasm of female breast ([place]) (Resolved)    Breast cancer of upper-inner quadrant of left female breast ([place])   02/03/2010 Definitive Surgery    Left lumpectomy: Invasive ductal carcinoma, grade 2, DCIS, ER+ (100%), PR- (0%), HER2/neu negative, Ki67 7%      02/03/2010 Pathologic Stage    Stage IA: T1c pNx       02/2010 - 01/2015 Anti-estrogen oral therapy    Letrozole 2.5 mg daily        INTERVAL HISTORY:  Ms. Kamm presents to the [facility] today for routine follow-up for her history of breast cancer.  Overall, she reports feeling quite well. She did just get out of the hospital a couple of weeks ago due to pneumonia, sepsis and questionably a mild heart attack, following the hospitalization she was at assisted living/rehab x 6 weeks.  She has since then required 24/7 oxygen 3L [place].  She has been keeping close f/u with her PCP-Dr. Sharlet Salina, and her pulmonologist-Dr. Vaughan Browner.    REVIEW OF SYSTEMS:  Review of Systems  Constitutional: Negative for appetite change, chills, diaphoresis, fatigue, fever and unexpected weight change.  HENT:   Negative for hearing loss and lump/mass.   Eyes: Negative for eye problems and icterus.  Respiratory: Negative for chest tightness and cough.   Cardiovascular: Negative for chest pain, leg swelling and palpitations.  Gastrointestinal: Negative for abdominal distention and abdominal pain.  Endocrine: Negative for hot flashes.  Genitourinary: Negative for difficulty urinating and dyspareunia.   Musculoskeletal: Negative for arthralgias.  Skin: Negative for itching and rash.  Neurological: Negative for dizziness, extremity weakness and headaches.  Hematological: Negative for adenopathy. Does not bruise/bleed easily.  Breast: Denies any new nodularity, masses, tenderness, nipple changes, or nipple  discharge.       PAST MEDICAL/SURGICAL HISTORY:  Past Medical History:  Diagnosis Date  . Acute blood loss anemia 04/20/2012  . AF (atrial fibrillation) ([place])     AV ablation 9/09 [place] per Dr Ola Spurr - AV node ablation 9/11 Dr Caryl Comes  . Amiodarone pulmonary toxicity   . Asthma   . Bronchitis    hx of   . [name] (coronary artery disease)    (not sure of this 11/10  . CHF (congestive heart failure) ([place])   . Chronic renal insufficiency, stage III (moderate)    CrCl about 60 ml/min  . CKD (chronic kidney disease) stage 3, GFR 30-59 ml/min   . Complication of anesthesia    "psycotic episode" after hip surg - resolved  . COPD (chronic obstructive pulmonary disease) (HCC)    emphysema -FeV1 73% DLCO 53% 5/09  . CVA (cerebral vascular accident) ([place])    no residual effects evident to pt   . Depression 06/06/2012  . Diabetes ([place])   . Diastolic heart failure    Acute on Chronic  . Eczema   . GERD (gastroesophageal reflux disease)   . History of skin cancer   . Hx of cardiovascular stress test    Lexiscan Myoview (09/2013):  No definite ischemia, EF 67%; low risk  . Hyperlipidemia   . Invasive ductal carcinoma of breast ([place]) 2011   LEFT   . Mental disorder   . OA (osteoarthritis) of knee    RIGHT  . Pacemaker    Permanent  . PAD (peripheral artery disease) (HCC)    w/hx right  iliac/SFA stenting and left and rt leg PTA  . Pain    pt states has pain in fingertips per right hand pt states has been told may be carpal tunnel  . Pneumonia    hx of   . Right sided sciatica   . Shortness of breath dyspnea    walking distances   . Sinoatrial node dysfunction (HCC)   . Sleep apnea    associated with hypersomnia uses O2 2L/M at night and during naps also uses CPAP  . Tobacco abuse   . Tremors of nervous system    hands bilat    Past Surgical History:  Procedure Laterality Date  . ATRIAL ABLATION SURGERY     x 2 - "did not help - had to have pacemaker"  . BREAST LUMPECTOMY      left breast  . CARDIAC CATHETERIZATION    . CATARACT EXTRACTION, BILATERAL     with IOL/Dr Katy Fitch  . EP IMPLANTABLE DEVICE N/A 02/13/2015   Procedure: PPM Generator Changeout-St. Jude device;  Surgeon: Deboraha Sprang, MD;  Location: [facility];  Service: Cardiovascular;  Laterality: N/A;  . MASTECTOMY, PARTIAL  02/03/2010   Left/Dr Rosenbower  . ORIF ACETABULAR FRACTURE Left 04/19/2012   Procedure: OPEN REDUCTION INTERNAL FIXATION (ORIF) ACETABULAR FRACTURE;  Surgeon: Rozanna Box, MD;  Location: [facility];  Service: Orthopedics;  Laterality: Left;  . sun spot removed      forhead  . TOTAL HIP ARTHROPLASTY Left 10/31/2014   Procedure: LEFT TOTAL HIP ARTHROPLASTY POSTERIOR  APPROACH;  Surgeon: Gaynelle Arabian, MD;  [facility];  Service: Orthopedics;  Laterality: Left;  . TOTAL KNEE ARTHROPLASTY Right 10/16/2013   Procedure: RIGHT TOTAL KNEE ARTHROPLASTY;  Surgeon: Gearlean Alf, MD;  [facility];  Service: Orthopedics;  Laterality: Right;  Marland Kitchen VASCULAR SURGERY     both legs      ALLERGIES:  Allergies  Allergen Reactions  . Cholestatin Other (See Comments)    RAGWEED SEASON...sneezing   . Sulfur Itching     CURRENT MEDICATIONS:  Outpatient Encounter Prescriptions as of 07/27/2016  Medication Sig  . acetaminophen (TYLENOL) 650 MG CR tablet Take 1,300 mg by mouth every 8 (eight) hours as needed for pain.  Marland Kitchen AMITIZA 24 MCG capsule TAKE 1 CAPSULE TWICE DAILY WITH A MEAL  . atorvastatin (LIPITOR) 20 MG tablet TAKE 1 TABLET EVERY DAY  . B-D ULTRA-FINE 33 LANCETS MISC Use to help check blood sugars daily Dx E11.9  . Biotin (BIOTIN 5000) 5 MG CAPS Take 5 mg by mouth at bedtime.   Marland Kitchen ELIQUIS 5 MG TABS tablet TAKE 1 TABLET TWICE DAILY  (MUST  ESTABLISH  NEW  PRIMARY CARE PROVIDER  FOR FUTURE REFILLS)  . Fluticasone-Salmeterol (ADVAIR) 250-50 MCG/DOSE AEPB Inhale 1 puff into the lungs 2 (two) times daily.  . furosemide (LASIX) 40 MG tablet TAKE ONE TABLET BY MOUTH ONCE DAILY   . glucose blood (BAYER CONTOUR TEST) test strip 1 each by Other route daily. Use to check blood sugars every day Dx E11.9  . guaiFENesin (MUCINEX) 600 MG 12 hr tablet Take 1 tablet (600 mg total) by mouth 2 (two) times daily.  Marland Kitchen levalbuterol (XOPENEX HFA) 45 MCG/ACT inhaler Inhale 2 puffs into the lungs every 6 (six) hours as needed. Reported on 09/23/2015  . levalbuterol (XOPENEX) 0.63 MG/3ML nebulizer solution Take 3 mLs (0.63 mg total) by nebulization every 6 (six) hours as needed for wheezing or shortness of breath.  . losartan (  COZAAR) 25 MG tablet Take 0.5 tablets (12.5 mg total) by mouth daily.  . metFORMIN (GLUCOPHAGE) 500 MG tablet TAKE 1 TABLET TWICE DAILY WITH A MEAL  . montelukast (SINGULAIR) 10 MG tablet Take 1 tablet (10 mg total) by mouth at bedtime.  . Multiple Vitamins-Minerals (MULTIVITAMIN WITH MINERALS) tablet Take 1 tablet by mouth 2 (two) times daily.   . potassium chloride SA (K-DUR,KLOR-CON) 20 MEQ tablet Take 1 tablet (20 mEq total) by mouth daily. 52mq in am, 444m in pm  . predniSONE (DELTASONE) 20 MG tablet Take 2 tablets (40 mg total) by mouth daily with breakfast.  . tiotropium (SPIRIVA HANDIHALER) 18 MCG inhalation capsule INHALE THE CONTENTS OF 1 CAPSULE EVERY DAY   No facility-administered encounter medications on file as of 07/27/2016.      ONCOLOGIC FAMILY HISTORY:  Family History  Problem Relation Age of Onset  . Heart disease Father      SOCIAL HISTORY:  FlCATHERN T[place]IRs [place] Ms. TuZerns currently retired.  She denies any current or history of tobacco, alcohol, or illicit drug use.     PHYSICAL EXAMINATION:  Vital Signs: Vitals:   07/27/16 0931  BP: (!) 111/44  Pulse: 72  Resp: 17  Temp: 98.2 F (36.8 C)   Filed Weights   07/27/16 0931  Weight: 162 lb 12.8 oz (73.8 kg)   General: Well-nourished, well-appearing female in no acute distress.  Accompanied by her granddaughter today.   HEENT:  Head is normocephalic.  Pupils equal and reactive to light. Conjunctivae clear without exudate.  Sclerae anicteric. Oral mucosa is pink, moist.  Oropharynx is pink without lesions or erythema.  Lymph: No cervical, supraclavicular, or infraclavicular lymphadenopathy noted on palpation.  Cardiovascular: Regular rate and rhythm.. Marland Kitchenespiratory: Clear to auscultation bilaterally. Chest expansion symmetric; breathing non-labored.  Breast Exam:  -Left breast: No appreciable masses on palpation. No skin redness, thickening, or peau d'orange appearance; no nipple retraction or nipple discharge; mild distortion in symmetry at previous lumpectomy site well healed scar without erythema or nodularity.  -Right breast: No appreciable masses on palpation. No skin redness, thickening, or peau d'orange appearance; no nipple retraction or nipple discharge;  -Axilla: No axillary adenopathy bilaterally.  GI: Abdomen soft and round; non-tender, non-distended. Bowel sounds normoactive. No hepatosplenomegaly.   GU: Deferred.  MSK: inability to left bilateral arms up at all due to shoulder ROM limitation Neuro: No focal deficits. Steady gait.  Psych: Mood and affect normal and appropriate for situation.  Extremities: No edema. Skin: Warm and dry.  LABORATORY DATA:  None for this visit   DIAGNOSTIC IMAGING:  Most recent mammogram:      ASSESSMENT AND PLAN:  Ms.. TuBeeds a pleasant [age].o. female with history of Stage IA left breast invasive ductal carcinoma, ER+/PR-/HER2-, diagnosed in 01/2010, treated with lumpectomy, and anti-estrogen therapy with Letrozole x 5 years.   [facility] and routine follow-up.   1. History of breast cancer:  Ms. TuFowles currently clinically and radiographically without evidence of disease or recurrence of breast cancer. She will be due for mammogram in 01/2017 orders placed today.    I encouraged her to call me with any questions or  concerns before her next visit at the cancer center, and I would be happy to see her sooner, if needed.    2. Bilateral shoulder ROM limitation: Meaghan cannot lift her arms up due to rom restriction in  her bilateral shoulders.  This is concerning considering she has been to rehab and had PT and OT, and tells me they haven't addressed it.  She says she typically won't move her arms upward due to pain in her shoulders as well. I don't have any of the documents to look at today from PT, however, her granddaughter is going to f/u with her PCP about addressing the ROM challenges.    3. Bone health:  Given Ms. Artiga's age, history of breast cancer, and her previous anti-estrogen therapy withLetrozole, she is at risk for bone demineralization. Her last DEXA scan was on 02/03/2016 and consistent with osteoporosis.  She is not on any bisphosphanate therapy.  We discussed bone health along with calcium and vitamin d intake.  4. Cancer screening:  Due to Ms. Hoel's history and her age, she should receive screening for skin cancers. She was encouraged to follow-up with her PCP for appropriate cancer screenings.   5. Health maintenance and wellness promotion: Ms. Gallus was encouraged to consume 5-7 servings of fruits and vegetables per day. She was also encouraged to engage in moderate to vigorous exercise for 30 minutes per day most days of the week. She was instructed to limit her alcohol consumption and continue to abstain from tobacco use.    Dispo:  -Return to cancer center in one year for LTS visit   A total of (30) minutes of face-to-face time was spent with this patient with greater than 50% of that time in counseling and care-coordination.   Gardenia Phlegm, [place] [phone]   Note: PRIMARY CARE PROVIDER Hoyt Koch, [place] [phone]

## 2016-07-28 ENCOUNTER — Telehealth: Payer: Self-pay | Admitting: Pulmonary Disease

## 2016-07-28 DIAGNOSIS — J440 Chronic obstructive pulmonary disease with acute lower respiratory infection: Secondary | ICD-10-CM | POA: Diagnosis not present

## 2016-07-28 DIAGNOSIS — J189 Pneumonia, unspecified organism: Secondary | ICD-10-CM | POA: Diagnosis not present

## 2016-07-28 NOTE — Telephone Encounter (Signed)
Please let the patient know that CXR does not show any acute infiltrate or pneumonia. There are changes of COPD that is unchanged from before.   Spoke with the pts son and he is aware of results per PM. Nothing further is needed.

## 2016-07-29 ENCOUNTER — Telehealth: Payer: Self-pay | Admitting: Pulmonary Disease

## 2016-07-29 ENCOUNTER — Telehealth: Payer: Self-pay | Admitting: Internal Medicine

## 2016-07-29 DIAGNOSIS — J189 Pneumonia, unspecified organism: Secondary | ICD-10-CM | POA: Diagnosis not present

## 2016-07-29 DIAGNOSIS — J440 Chronic obstructive pulmonary disease with acute lower respiratory infection: Secondary | ICD-10-CM | POA: Diagnosis not present

## 2016-07-29 NOTE — Telephone Encounter (Signed)
[name] [facility]) pt will be coming in on Friday for a port eval, and they will show her how to bleed her oxygen at that time..ert No call back is needed.Marland KitchenMarland Kitchen

## 2016-07-29 NOTE — Telephone Encounter (Signed)
I'm not sure what she is asking?

## 2016-07-29 NOTE — Telephone Encounter (Signed)
Patient called in wanting to Thank Dr. Sharlet Salina for the wheelchair order.  States her wheelchair will be dropped off on Tuesday.  Would like to know also if Dr. Sharlet Salina is going to dismiss her?

## 2016-07-29 NOTE — Telephone Encounter (Signed)
Noted  

## 2016-07-30 DIAGNOSIS — J440 Chronic obstructive pulmonary disease with acute lower respiratory infection: Secondary | ICD-10-CM | POA: Diagnosis not present

## 2016-07-30 DIAGNOSIS — J189 Pneumonia, unspecified organism: Secondary | ICD-10-CM | POA: Diagnosis not present

## 2016-07-31 DIAGNOSIS — J189 Pneumonia, unspecified organism: Secondary | ICD-10-CM | POA: Diagnosis not present

## 2016-07-31 DIAGNOSIS — J440 Chronic obstructive pulmonary disease with acute lower respiratory infection: Secondary | ICD-10-CM | POA: Diagnosis not present

## 2016-08-04 ENCOUNTER — Telehealth: Payer: Self-pay | Admitting: Pulmonary Disease

## 2016-08-04 DIAGNOSIS — J449 Chronic obstructive pulmonary disease, unspecified: Secondary | ICD-10-CM

## 2016-08-04 NOTE — Telephone Encounter (Signed)
Spoke with pt, states she is wanting a [employer] manager.  Patient will need a new order to [facility]  PM please advise if ok to order a [employer] POC.  Thanks.

## 2016-08-05 DIAGNOSIS — J189 Pneumonia, unspecified organism: Secondary | ICD-10-CM | POA: Diagnosis not present

## 2016-08-05 DIAGNOSIS — J440 Chronic obstructive pulmonary disease with acute lower respiratory infection: Secondary | ICD-10-CM | POA: Diagnosis not present

## 2016-08-05 NOTE — Telephone Encounter (Signed)
Melissa returned call Unfortunately [facility] is not able to go to patient's home to help her figure out if the POC will fit on her scooter.  Pt will need to take her scooter to the store.  Also yes, [facility] will speak with patient prior to ordering the POC.

## 2016-08-05 NOTE — Telephone Encounter (Signed)
Ok to order a Simply Go POC  PM

## 2016-08-05 NOTE — Telephone Encounter (Signed)
PM please advise about the order to [facility].  thanks

## 2016-08-05 NOTE — Telephone Encounter (Signed)
Called spoke with patient to inform her that PM okayed the POC thru [facility], however pt expressed some concern with the SimplyGo because she just received a scooter from [facility] yesterday and is unsure if the SimplyGo will fit on the scooter.  She would also like to know "what kind of deal [facility] will give her."  Advised pt that [facility] may need to run the Rx for the POC through her insurance before knowing cost.  Patient would like to speak with someone from [facility] and have someone come to her home to view her scooter to see if the SimplyGo will fit.  LMOM TCB x1 for Melissa w/ [facility] to see if this is possible.

## 2016-08-06 NOTE — Telephone Encounter (Signed)
Spoke with [name], who states they do not offer the service where someone can come to pt's home that pt will need to come to the office with the scooter when she is being evaluated to see which would fit on her scooter. He also stated pt will not get any deal on the poc if this is being ran through insurance. Contacted pt and informed her of Jason's message. Pt states she has no way of getting her scooter to [facility] and is unsure of if there is other options. I gave her [place] number, she did not want the order being placed until she spoke with [facility] first. Will await her call back

## 2016-08-07 DIAGNOSIS — J189 Pneumonia, unspecified organism: Secondary | ICD-10-CM | POA: Diagnosis not present

## 2016-08-07 DIAGNOSIS — J440 Chronic obstructive pulmonary disease with acute lower respiratory infection: Secondary | ICD-10-CM | POA: Diagnosis not present

## 2016-08-11 ENCOUNTER — Encounter: Payer: Self-pay | Admitting: Cardiovascular Disease

## 2016-08-12 ENCOUNTER — Other Ambulatory Visit: Payer: Self-pay | Admitting: Pulmonary Disease

## 2016-08-12 ENCOUNTER — Telehealth: Payer: Self-pay

## 2016-08-12 ENCOUNTER — Telehealth: Payer: Self-pay | Admitting: Pulmonary Disease

## 2016-08-12 ENCOUNTER — Encounter: Payer: Self-pay | Admitting: Pulmonary Disease

## 2016-08-12 ENCOUNTER — Ambulatory Visit (INDEPENDENT_AMBULATORY_CARE_PROVIDER_SITE_OTHER): Payer: Medicare Other | Admitting: Pulmonary Disease

## 2016-08-12 VITALS — BP 110/70 | HR 83 | Ht 65.5 in | Wt 159.8 lb

## 2016-08-12 DIAGNOSIS — G4733 Obstructive sleep apnea (adult) (pediatric): Secondary | ICD-10-CM

## 2016-08-12 DIAGNOSIS — J449 Chronic obstructive pulmonary disease, unspecified: Secondary | ICD-10-CM

## 2016-08-12 DIAGNOSIS — J4489 Other specified chronic obstructive pulmonary disease: Secondary | ICD-10-CM

## 2016-08-12 DIAGNOSIS — J9611 Chronic respiratory failure with hypoxia: Secondary | ICD-10-CM

## 2016-08-12 DIAGNOSIS — R05 Cough: Secondary | ICD-10-CM | POA: Diagnosis not present

## 2016-08-12 DIAGNOSIS — R059 Cough, unspecified: Secondary | ICD-10-CM

## 2016-08-12 MED ORDER — AZITHROMYCIN 250 MG PO TABS: ORAL_TABLET | ORAL | 0 refills | Status: AC

## 2016-08-12 NOTE — Progress Notes (Signed)
[name]    [id number]    1931-03-14  Primary Care Physician:Crawford, Real Cons, MD  Referring Physician: Hoyt Koch, MD [place], [place] [id number]  Chief complaint:   Follow up for COPD GOLD C Heavy ex smoker OSA on CPAP with 2Lt O2  HPI: [name] has history of COPD with frequent exacerbations in the past. She is a former patient of [name] and a heavy former smoker. She smoked one and half to 3 packs per day for nearly 60 years. She quit 2013.  She underwent right hip and total knee replacement in the past. She has apparently tolerated these procedures well. She has history of atrial fibrillation and failed several ablations. She has a permanent pacemaker. 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 She is not on amiodarone at present.  [name] was hospitalized in Feb 2018 with left lower lobe community-acquired pneumonia, sepsis, acute systolic heart failure, AKI. She was initially in the ICU requiring pressor, Bipap support. Echo showed EF 25-30 percent and she had elevated troponins which was felt to be secondary to demand ischemia.  Interim History: She returns to the clinic as an acute visit with complaints of increased chest congestion, cough with yellow/green sputum production. She denies any fevers, chills. Symptoms associated with sinus congestion, nasal discharge.  Outpatient Encounter Prescriptions as of 08/12/2016  Medication Sig  . acetaminophen (TYLENOL) 650 MG CR tablet Take 1,300 mg by mouth every 8 (eight) hours as needed for pain.  Marland Kitchen AMITIZA 24 MCG capsule TAKE 1 CAPSULE TWICE DAILY WITH A MEAL  . atorvastatin (LIPITOR) 20 MG tablet TAKE 1 TABLET EVERY DAY  . B-D ULTRA-FINE 33 LANCETS MISC Use to help check blood sugars daily Dx E11.9  . Biotin (BIOTIN 5000) 5 MG CAPS Take 5 mg by mouth at bedtime.   Marland Kitchen  ELIQUIS 5 MG TABS tablet TAKE 1 TABLET TWICE DAILY  (MUST  ESTABLISH  NEW  PRIMARY CARE PROVIDER  FOR FUTURE REFILLS)  . Fluticasone-Salmeterol (ADVAIR) 250-50 MCG/DOSE AEPB Inhale 1 puff into the lungs 2 (two) times daily.  . furosemide (LASIX) 40 MG tablet TAKE ONE TABLET BY MOUTH ONCE DAILY  . glucose blood (BAYER CONTOUR TEST) test strip 1 each by Other route daily. Use to check blood sugars every day Dx E11.9  . guaiFENesin (MUCINEX) 600 MG 12 hr tablet Take 1 tablet (600 mg total) by mouth 2 (two) times daily.  Marland Kitchen levalbuterol (XOPENEX HFA) 45 MCG/ACT inhaler Inhale 2 puffs into the lungs every 6 (six) hours as needed. Reported on 09/23/2015  . levalbuterol (XOPENEX) 0.63 MG/3ML nebulizer solution Take 3 mLs (0.63 mg total) by nebulization every 6 (six) hours as needed for wheezing or shortness of breath.  . losartan (COZAAR) 25 MG tablet Take 0.5 tablets (12.5 mg total) by mouth daily.  . metFORMIN (GLUCOPHAGE) 500 MG tablet TAKE 1 TABLET TWICE DAILY WITH A MEAL  . montelukast (SINGULAIR) 10 MG tablet Take 1 tablet (10 mg total) by mouth at bedtime.  . Multiple Vitamins-Minerals (MULTIVITAMIN WITH MINERALS) tablet Take 1 tablet by mouth 2 (two) times daily.   . potassium chloride SA (K-DUR,KLOR-CON) 20 MEQ tablet Take 1 tablet (20 mEq total) by mouth daily. 71mq in am, 426m in pm  . tiotropium (SPIRIVA HANDIHALER) 18 MCG inhalation capsule INHALE THE CONTENTS OF  1 CAPSULE EVERY DAY  . azithromycin (ZITHROMAX) 250 MG tablet Take 2 tablets (500 mg) on  Day 1,  followed by 1 tablet (250 mg) once daily on Days 2 through 5.   No facility-administered encounter medications on file as of 08/12/2016.     Allergies as of 08/12/2016 - Review Complete 08/12/2016  Allergen Reaction Noted  . Cholestatin Other (See Comments) 09/02/2010  . Sulfur Itching 01/13/2013    Past Medical History:  Diagnosis Date  . Acute blood loss anemia 04/20/2012  . AF (atrial fibrillation) ([place])     AV ablation 9/09  [place] per Dr Ola Spurr - AV node ablation 9/11 Dr Caryl Comes  . Amiodarone pulmonary toxicity   . Asthma   . Bronchitis    hx of   . [name] (coronary artery disease)    (not sure of this 11/10  . CHF (congestive heart failure) ([place])   . Chronic renal insufficiency, stage III (moderate)    CrCl about 60 ml/min  . CKD (chronic kidney disease) stage 3, GFR 30-59 ml/min   . Complication of anesthesia    "psycotic episode" after hip surg - resolved  . COPD (chronic obstructive pulmonary disease) (H[facility])    emphysema -FeV1 73% DLCO 53% 5/09  . CVA (cerebral vascular accident) ([place])    no residual effects evident to pt   . Depression 06/06/2012  . Diabetes ([place])   . Diastolic heart failure    Acute on Chronic  . Eczema   . GERD (gastroesophageal reflux disease)   . History of skin cancer   . Hx of cardiovascular stress test    Lexiscan Myoview (09/2013):  No definite ischemia, EF 67%; low risk  . Hyperlipidemia   . Invasive ductal carcinoma of breast ([place]) 2011   LEFT   . Mental disorder   . OA (osteoarthritis) of knee    RIGHT  . Pacemaker    Permanent  . PAD (peripheral artery disease) (HCC)    w/hx right iliac/SFA stenting and left and rt leg PTA  . Pain    pt states has pain in fingertips per right hand pt states has been told may be carpal tunnel  . Pneumonia    hx of   . Right sided sciatica   . Shortness of breath dyspnea    walking distances   . Sinoatrial node dysfunction (HCC)   . Sleep apnea    associated with hypersomnia uses O2 2L/M at night and during naps also uses CPAP  . Tobacco abuse   . Tremors of nervous system    hands bilat     Past Surgical History:  Procedure Laterality Date  . ATRIAL ABLATION SURGERY     x 2 - "did not help - had to have pacemaker"  . BREAST LUMPECTOMY     left breast  . CARDIAC CATHETERIZATION    . CATARACT EXTRACTION, BILATERAL     with IOL/Dr Katy Fitch  . EP IMPLANTABLE DEVICE N/A 02/13/2015   Procedure: PPM Generator Changeout-St.  Jude device;  Surgeon: Deboraha Sprang, MD;  Location: [place] [facility];  Service: Cardiovascular;  Laterality: N/A;  . MASTECTOMY, PARTIAL  02/03/2010   Left/Dr Rosenbower  . ORIF ACETABULAR FRACTURE Left 04/19/2012   Procedure: OPEN REDUCTION INTERNAL FIXATION (ORIF) ACETABULAR FRACTURE;  Surgeon: Rozanna Box, MD;  Location: [place];  Service: Orthopedics;  Laterality: Left;  . sun spot removed      forhead  . TOTAL HIP ARTHROPLASTY Left 10/31/2014  Procedure: LEFT TOTAL HIP ARTHROPLASTY POSTERIOR  APPROACH;  Surgeon: Gaynelle Arabian, MD;  Location: [facility];  Service: Orthopedics;  Laterality: Left;  . TOTAL KNEE ARTHROPLASTY Right 10/16/2013   Procedure: RIGHT TOTAL KNEE ARTHROPLASTY;  Surgeon: Gearlean Alf, MD;  Location: [facility];  Service: Orthopedics;  Laterality: Right;  Marland Kitchen VASCULAR SURGERY     both legs     Family History  Problem Relation Age of Onset  . Heart disease Father     Social History   Social History  . Marital status: Married    Spouse name: N/A  . Number of children: N/A  . Years of education: N/A   Occupational History  . Not on file.   Social History Main Topics  . Smoking status: Former Smoker    Packs/day: 1.00    Years: 55.00    Types: Cigarettes    Quit date: 12/19/2011  . Smokeless tobacco: Never Used     Comment: started smoking at age 29--4 cigs per day.  smoked 2ppd the last 10 yrs  . Alcohol use Yes     Comment: wine (rare)  . Drug use: No  . Sexual activity: No   Other Topics Concern  . Not on file   Social History Narrative   HS Graduate; [employer].  Married '61.  2 sons - '70, '71; Dtrs - '64,'68;    6 grandchildren.  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 Interests -Tai-chi & water aerobics, gardening, active lifestyle.  Marriage a bit stressful - SO w/membory problems  and difficult behavior. She denies any personal safety concerns. End of life care: need to address at next OV              Review of systems: Review of Systems  Constitutional: Negative for fever and chills.  HENT: Negative.   Eyes: Negative for blurred vision.  Respiratory: as per HPI  Cardiovascular: Negative for chest pain and palpitations.  Gastrointestinal: Negative for vomiting, diarrhea, blood per rectum. Genitourinary: Negative for dysuria, urgency, frequency and hematuria.  Musculoskeletal: Negative for myalgias, back pain and joint pain.  Skin: Negative for itching and rash.  Neurological: Negative for dizziness, tremors, focal weakness, seizures and loss of consciousness.  Endo/Heme/Allergies: Negative for environmental allergies.  Psychiatric/Behavioral: Negative for depression, suicidal ideas and hallucinations.  All other systems reviewed and are negative.  Physical Exam: Blood pressure 110/70, pulse 83, height 5' 5.5" (1.664 m), weight 159 lb 12.8 oz (72.5 kg), SpO2 100 %. Gen:      No acute distress HEENT:  EOMI, sclera anicteric Neck:     No masses; no thyromegaly Lungs:    Clear to auscultation bilaterally; normal respiratory effort CV:         Regular rate and rhythm; no murmurs Abd:      + bowel sounds; soft, non-tender; no palpable masses, no distension Ext:    No edema; adequate peripheral perfusion Skin:      Warm and dry; no rash Neuro: alert and oriented x 3 Psych: normal mood and affect  Data Reviewed: Chest x-ray 04/25/16-left lower lobe airspace consolidation. Chest x-ray 05/15/16-improvement in left lower lobe consolidation Chest x-ray 06/17/16-hyperinflation, chronic interstitial changes. No acute abnormality Chest x-ray 07/27/16- hyperinflation, chronic interstitial changes. No change from before. I have reviewed all images personally.  PFTs (02/20/10) FVC 1.66 (56%) FEV1 1.289 (59%) F/F  81   Assessment:  #1 COPD GOLD D Here as an acute visit  for acute bronchitis with congestion. We will treat with Z-Pak. I have asked her take Mucinex twice a day and start using the flutter valve 3 times a day for clearance of secretions. Continue supplemental oxygen.  Plan/Recommendations: - Z pack - Mucinex, flutter valve  Follow up in 1-2 months  Marshell Garfinkel MD [facility] Pager [phone] 08/12/2016, 12:38 PM  CC: [name], *

## 2016-08-12 NOTE — Telephone Encounter (Signed)
Melissa, [facility], returning call, CB [phone]

## 2016-08-12 NOTE — Telephone Encounter (Signed)
Spoke with pt this morning and she stated that she has an appt with PM on Friday and she stated that if she waits until Friday she may not be here.  She is having nasal congestion and cough with green mucus.  She stated that she has so much congestion she is not sure how to get rid of it.  PM please advise.  You are booked up today and tomorrow.    Allergies  Allergen Reactions  . Cholestatin Other (See Comments)    RAGWEED SEASON...sneezing   . Sulfur Itching

## 2016-08-12 NOTE — Telephone Encounter (Signed)
Called Melissa back. [facility] will reach out to patient regarding her simply go.

## 2016-08-12 NOTE — Telephone Encounter (Signed)
During pt's acute visit with PM today, pt states she had some confusion with order that was placed during 5/21 OV for POC and to bled O2 into cpap. Pt states O2 has not been bleed into cpap. I have spoken with [name] with [facility], who states pt is requesting a simply go mini and [facility] does not offered this machine. [name] states pt refused shipping of port for oxygen to be bled into cpap, due to wanting [facility] to come out and plug this up. [facility] typically calls pt and goes over installing of the the port. [name] states he will send a message to RT department to have them call pt to f/u on this.

## 2016-08-12 NOTE — Telephone Encounter (Signed)
Pt has been scheduled for 11:30 with PM on 08/12/16. Nothing further needed.

## 2016-08-12 NOTE — Patient Instructions (Signed)
We will give a prescription for Z-Pak Start using the Mucinex twice daily Use the flutter valve to 3 times daily for clearance of secretions  Return to clinic in 1-2 months.

## 2016-08-12 NOTE — Telephone Encounter (Signed)
I have called and lmomtcb for [name] with [facility]---pt has been trying to call and speak with Hinton Dyer at [facility] and she stated that the person that answers the phone will not let her speak with Hinton Dyer.  Pt is wanting to get a larger POC and she stated that the larger one with wheels would work great for her but she needs this information:  How much is the small, medium and larger POC.  She uses 2 liters of oxygen.  She wants to know the weigh of each one as well.   I have left a message for [name] to call back.

## 2016-08-13 NOTE — Telephone Encounter (Signed)
Staff message sent to [facility] to call and follow up on this pt today.

## 2016-08-14 ENCOUNTER — Ambulatory Visit (INDEPENDENT_AMBULATORY_CARE_PROVIDER_SITE_OTHER): Payer: Medicare Other | Admitting: Cardiovascular Disease

## 2016-08-14 ENCOUNTER — Encounter: Payer: Self-pay | Admitting: Cardiovascular Disease

## 2016-08-14 ENCOUNTER — Ambulatory Visit: Payer: Medicare Other | Admitting: Pulmonary Disease

## 2016-08-14 VITALS — BP 110/56 | HR 70 | Ht 65.5 in | Wt 160.8 lb

## 2016-08-14 DIAGNOSIS — I5022 Chronic systolic (congestive) heart failure: Secondary | ICD-10-CM

## 2016-08-14 DIAGNOSIS — I481 Persistent atrial fibrillation: Secondary | ICD-10-CM

## 2016-08-14 DIAGNOSIS — I4819 Other persistent atrial fibrillation: Secondary | ICD-10-CM

## 2016-08-14 NOTE — Progress Notes (Signed)
Cardiology Office Note Date:  08/14/2016   ID:  Tiffany, [name] 29-Dec-1931, MRN [id number]  PCP:  Hoyt Koch, MD  Cardiologist:  Sherren Mocha, MD    Chief Complaint  Patient presents with  . Shortness of Breath     History of Present Illness: [name] is a 83 y.o. female who presents for follow-up of atrial fibrillation, symptomatic bradycardia status post permanent pacemaker placement, COPD, carotid stenosis, chronic kidney disease stage III, and prior CVA.  The patient was hospitalized in February with pneumonia and was noted to have significant LV systolic dysfunction with LVEF 25-30%. There was a question of Takotsubo versus sepsis-related cardiomyopathy. She was treated medically with slow improvement.  Reports no recent change in her breathing. She is on home O2 24/7. She is checking her oxygen saturation and it is running in the 90-92% range. She weighs daily and her weight is trending down. No chest pain, orthopnea, or PND.   Past Medical History:  Diagnosis Date  . Acute blood loss anemia 04/20/2012  . AF (atrial fibrillation) ([place])     AV ablation 9/09 [place] per Dr Ola Spurr - AV node ablation 9/11 Dr Caryl Comes  . Amiodarone pulmonary toxicity   . Asthma   . Bronchitis    hx of   . [name] (coronary artery disease)    (not sure of this 11/10  . CHF (congestive heart failure) ([place])   . Chronic renal insufficiency, stage III (moderate)    CrCl about 60 ml/min  . CKD (chronic kidney disease) stage 3, GFR 30-59 ml/min   . Complication of anesthesia    "psycotic episode" after hip surg - resolved  . COPD (chronic obstructive pulmonary disease) (HCC)    emphysema -FeV1 73% DLCO 53% 5/09  . CVA (cerebral vascular accident) ([place])    no residual effects evident to pt   . Depression 06/06/2012  . Diabetes ([place])   . Diastolic heart failure    Acute on Chronic  . Eczema   . GERD (gastroesophageal reflux disease)   . History of skin cancer   . Hx of  cardiovascular stress test    Lexiscan Myoview (09/2013):  No definite ischemia, EF 67%; low risk  . Hyperlipidemia   . Invasive ductal carcinoma of breast ([place]) 2011   LEFT   . Mental disorder   . OA (osteoarthritis) of knee    RIGHT  . Pacemaker    Permanent  . PAD (peripheral artery disease) (HCC)    w/hx right iliac/SFA stenting and left and rt leg PTA  . Pain    pt states has pain in fingertips per right hand pt states has been told may be carpal tunnel  . Pneumonia    hx of   . Right sided sciatica   . Shortness of breath dyspnea    walking distances   . Sinoatrial node dysfunction (HCC)   . Sleep apnea    associated with hypersomnia uses O2 2L/M at night and during naps also uses CPAP  . Tobacco abuse   . Tremors of nervous system    hands bilat     Past Surgical History:  Procedure Laterality Date  . ATRIAL ABLATION SURGERY     x 2 - "did not help - had to have pacemaker"  . BREAST LUMPECTOMY     left breast  . CARDIAC CATHETERIZATION    . CATARACT EXTRACTION, BILATERAL     with IOL/Dr Katy Fitch  . EP IMPLANTABLE DEVICE  N/A 02/13/2015   Procedure: PPM Generator Changeout-St. Jude device;  Surgeon: Deboraha Sprang, MD;  Location: [facility];  Service: Cardiovascular;  Laterality: N/A;  . MASTECTOMY, PARTIAL  02/03/2010   Left/Dr Rosenbower  . ORIF ACETABULAR FRACTURE Left 04/19/2012   Procedure: OPEN REDUCTION INTERNAL FIXATION (ORIF) ACETABULAR FRACTURE;  Surgeon: Rozanna Box, MD;  Location: [facility];  Service: Orthopedics;  Laterality: Left;  . sun spot removed      forhead  . TOTAL HIP ARTHROPLASTY Left 10/31/2014   Procedure: LEFT TOTAL HIP ARTHROPLASTY POSTERIOR  APPROACH;  Surgeon: Gaynelle Arabian, MD;  Location: [facility];  Service: Orthopedics;  Laterality: Left;  . TOTAL KNEE ARTHROPLASTY Right 10/16/2013   Procedure: RIGHT TOTAL KNEE ARTHROPLASTY;  Surgeon: Gearlean Alf, MD;  Location: [facility];  Service: Orthopedics;  Laterality: Right;  Marland Kitchen VASCULAR SURGERY      both legs     Current Outpatient Prescriptions  Medication Sig Dispense Refill  . acetaminophen (TYLENOL) 650 MG CR tablet Take 1,300 mg by mouth every 8 (eight) hours as needed for pain.    Marland Kitchen ADVAIR DISKUS 250-50 MCG/DOSE AEPB INHALE 1 PUFF TWICE DAILY  (30  DAY  EXPIRATION) 180 each 3  . AMITIZA 24 MCG capsule TAKE 1 CAPSULE TWICE DAILY WITH A MEAL 180 capsule 6  . atorvastatin (LIPITOR) 20 MG tablet TAKE 1 TABLET EVERY DAY 90 tablet 3  . azithromycin (ZITHROMAX) 250 MG tablet Take 2 tablets (500 mg) on  Day 1,  followed by 1 tablet (250 mg) once daily on Days 2 through 5. 6 each 0  . B-D ULTRA-FINE 33 LANCETS MISC Use to help check blood sugars daily Dx E11.9 100 each 2  . Biotin (BIOTIN 5000) 5 MG CAPS Take 5 mg by mouth at bedtime.     Marland Kitchen ELIQUIS 5 MG TABS tablet TAKE 1 TABLET TWICE DAILY  (MUST  ESTABLISH  NEW  PRIMARY CARE PROVIDER  FOR FUTURE REFILLS) 180 tablet 3  . furosemide (LASIX) 40 MG tablet TAKE ONE TABLET BY MOUTH ONCE DAILY 90 tablet 1  . glucose blood (BAYER CONTOUR TEST) test strip 1 each by Other route daily. Use to check blood sugars every day Dx E11.9 90 each 3  . guaiFENesin (MUCINEX) 600 MG 12 hr tablet Take 1 tablet (600 mg total) by mouth 2 (two) times daily. 30 tablet 0  . levalbuterol (XOPENEX HFA) 45 MCG/ACT inhaler Inhale 2 puffs into the lungs every 6 (six) hours as needed. Reported on 09/23/2015    . levalbuterol (XOPENEX) 0.63 MG/3ML nebulizer solution Take 3 mLs (0.63 mg total) by nebulization every 6 (six) hours as needed for wheezing or shortness of breath. 75 mL 5  . losartan (COZAAR) 25 MG tablet Take 0.5 tablets (12.5 mg total) by mouth daily. 30 tablet 0  . metFORMIN (GLUCOPHAGE) 500 MG tablet TAKE 1 TABLET TWICE DAILY WITH A MEAL 180 tablet 3  . montelukast (SINGULAIR) 10 MG tablet Take 1 tablet (10 mg total) by mouth at bedtime. 30 tablet 3  . Multiple Vitamins-Minerals (MULTIVITAMIN WITH MINERALS) tablet Take 1 tablet by mouth 2 (two) times daily.      . potassium chloride SA (K-DUR,KLOR-CON) 20 MEQ tablet Take 1 tablet (20 mEq total) by mouth daily. 63mq in am, 443m in pm 30 tablet 0  . SPIRIVA HANDIHALER 18 MCG inhalation capsule INHALE THE CONTENTS OF 1 CAPSULE EVERY DAY 90 capsule 3   No current facility-administered medications for this visit.  Allergies:   Cholestatin and Sulfur   Social History:  The patient  reports that she quit smoking about 4 years ago. Her smoking use included Cigarettes. She has a 55.00 pack-year smoking history. She has never used smokeless tobacco. She reports that she drinks alcohol. She reports that she does not use drugs.   Family History:  The patient's  family history includes Heart disease in her father.   ROS:  Please see the history of present illness.  Otherwise, review of systems is positive for fatigue.  All other systems are reviewed and negative.   PHYSICAL EXAM: VS:  BP (!) 110/56   Pulse 70   Ht 5' 5.5" (1.664 m)   Wt 160 lb 12.8 oz (72.9 kg)   SpO2 97%   BMI 26.35 kg/m  , BMI Body mass index is 26.35 kg/m. GEN: Well nourished, well developed, elderly woman in no acute distress  HEENT: normal  Neck: no JVD, no masses. No carotid bruits Cardiac: RRR without murmur or gallop                Respiratory:  clear to auscultation bilaterally, normal work of breathing GI: soft, nontender, nondistended, + BS MS: no deformity or atrophy  Ext: no pretibial edema Skin: warm and dry, no rash Neuro:  Strength and sensation are intact Psych: euthymic mood, full affect  EKG:  EKG is not ordered today.  Recent Labs: 05/01/2016: ALT 46 05/03/2016: BUN 12; Creatinine, Ser 0.99; Hemoglobin 11.8; Magnesium 1.9; Platelets 292; Potassium 3.4; Sodium 140   Lipid Panel     Component Value Date/Time   CHOL 133 11/22/2015 1357   TRIG 205.0 (H) 11/22/2015 1357   HDL 43.70 11/22/2015 1357   CHOLHDL 3 11/22/2015 1357   VLDL 41.0 (H) 11/22/2015 1357   LDLCALC 53 08/27/2011 1144   LDLDIRECT  67.0 11/22/2015 1357      Wt Readings from Last 3 Encounters:  08/14/16 160 lb 12.8 oz (72.9 kg)  08/12/16 159 lb 12.8 oz (72.5 kg)  07/27/16 161 lb 9.6 oz (73.3 kg)     Cardiac Studies Reviewed: 2D Echo 04/27/2016: Study Conclusions  - Left ventricle: ? Takatsubo mid and distal inferior/anterior   walls apex and septum hypokinetic. Wall thickness was increased   in a pattern of mild LVH. Systolic function was severely reduced.   The estimated ejection fraction was in the range of 25% to 30%.   Doppler parameters are consistent with both elevated ventricular   end-diastolic filling pressure and elevated left atrial filling   pressure. - Mitral valve: Severely calcified annulus. Moderately thickened,   moderately calcified leaflets . There was mild regurgitation.   Valve area by continuity equation (using LVOT flow): 2.48 cm^2. - Left atrium: The atrium was moderately dilated. - Atrial septum: No defect or patent foramen ovale was identified. - Pulmonary arteries: PA peak pressure: 56 mm Hg (S). - Impressions: If high suspicion for SBE consider TEE given degree   of MAC and pacing wires.  Impressions:  - If high suspicion for SBE consider TEE given degree of MAC and   pacing wires.  ASSESSMENT AND PLAN: 1.  Persistent atrial fibrillation: The patient is anticoagulated with Ellik was. She has had permanent pacemaker placed. Her heart rate is well controlled.  2. Chronic systolic heart failure: Recently documented LV dysfunction during a hospitalization with pneumonia/sepsis. Considerations include Takotsubo syndrome, pacemaker-related LV dysfunction, sepsis-related cardiomyopathy, or progressive [name]. The patient appears stable. She is not able to tolerate  any medical therapy because of low blood pressure. Volume status looks good on exam today. Will repeat an echocardiogram at the next available time.  3. Complete heart block status post AV junction ablation and permanent  pacing followed by Dr. Caryl Comes  Current medicines are reviewed with the patient today.  The patient does not have concerns regarding medicines.  Labs/ tests ordered today include:   Orders Placed This Encounter  Procedures  . ECHOCARDIOGRAM COMPLETE   Disposition:   FU 6 months  Signed, Sherren Mocha, MD  08/14/2016 1:54 PM    [facility] [place], [place], [place]  [address] Phone: [phone]; Fax: [phone]

## 2016-08-14 NOTE — Telephone Encounter (Signed)
Spoke with pt. She is aware of this information. Nothing further was needed. 

## 2016-08-14 NOTE — Patient Instructions (Addendum)
Medication Instructions:  Your physician recommends that you continue on your current medications as directed. Please refer to the Current Medication list given to you today.  Labwork: No new orders.   Testing/Procedures: Your physician has requested that you have an echocardiogram. Echocardiography is a painless test that uses sound waves to create images of your heart. It provides your doctor with information about the size and shape of your heart and how well your heart's chambers and valves are working. This procedure takes approximately one hour. There are no restrictions for this procedure. (Patient went to check out and advised them that she will call back to make this appointment)  Follow-Up: Your physician wants you to follow-up in: 6 MONTHS with Dr Burt Knack.  You will receive a reminder letter in the mail two months in advance. If you don't receive a letter, please call our office to schedule the follow-up appointment.   Any Other Special Instructions Will Be Listed Below (If Applicable).     If you need a refill on your cardiac medications before your next appointment, please call your pharmacy.

## 2016-08-14 NOTE — Telephone Encounter (Signed)
she just needs to call RT Support at [phone] ext [phone] to schedule that.   Will need to call the pt and let her know that she will need to call and set up appt with RT at [facility]

## 2016-08-17 ENCOUNTER — Telehealth: Payer: Self-pay | Admitting: Internal Medicine

## 2016-08-17 NOTE — Telephone Encounter (Signed)
Ok to wait for Echo.

## 2016-08-17 NOTE — Telephone Encounter (Signed)
Please advise 

## 2016-08-17 NOTE — Telephone Encounter (Signed)
Bethany from [facility] called stating that the pt was suppose to be evaluated for OT and PT today but Jenifer said that she wants to wait until after she has her echocardiogram on June 21st. They wanted to make sure that it was okay to hold off or what we would suggest. Romelle Starcher can be reached at [phone] and would like a call back as soon as possible.

## 2016-08-17 NOTE — Telephone Encounter (Signed)
Bethany aware

## 2016-08-21 ENCOUNTER — Encounter: Payer: Self-pay | Admitting: Nurse Practitioner

## 2016-08-21 ENCOUNTER — Ambulatory Visit (INDEPENDENT_AMBULATORY_CARE_PROVIDER_SITE_OTHER): Payer: Medicare Other | Admitting: Nurse Practitioner

## 2016-08-21 ENCOUNTER — Ambulatory Visit (INDEPENDENT_AMBULATORY_CARE_PROVIDER_SITE_OTHER)
Admission: RE | Admit: 2016-08-21 | Discharge: 2016-08-21 | Disposition: A | Discharge status: Home / Self Care | Payer: Medicare Other | Source: Ambulatory Visit | Attending: Nurse Practitioner | Admitting: Nurse Practitioner

## 2016-08-21 VITALS — BP 108/60 | HR 73 | Temp 97.8°F | Ht 65.5 in | Wt 160.0 lb

## 2016-08-21 DIAGNOSIS — J9801 Acute bronchospasm: Secondary | ICD-10-CM | POA: Diagnosis not present

## 2016-08-21 DIAGNOSIS — R0602 Shortness of breath: Secondary | ICD-10-CM | POA: Diagnosis not present

## 2016-08-21 DIAGNOSIS — R05 Cough: Secondary | ICD-10-CM | POA: Diagnosis not present

## 2016-08-21 DIAGNOSIS — J449 Chronic obstructive pulmonary disease, unspecified: Secondary | ICD-10-CM

## 2016-08-21 MED ORDER — IPRATROPIUM-ALBUTEROL 0.5-2.5 (3) MG/3ML IN SOLN
3.0000 mL | Freq: Once | RESPIRATORY_TRACT | Status: AC
  Administered 2016-08-21: 3 mL via RESPIRATORY_TRACT

## 2016-08-21 MED ORDER — BENZONATATE 100 MG PO CAPS: 100.0000 mg | ORAL_CAPSULE | Freq: Three times a day (TID) | ORAL | 0 refills | Status: DC | PRN

## 2016-08-21 MED ORDER — LEVALBUTEROL TARTRATE 45 MCG/ACT IN AERO: 2.0000 | INHALATION_SPRAY | Freq: Four times a day (QID) | RESPIRATORY_TRACT | 1 refills | Status: DC | PRN

## 2016-08-21 NOTE — Patient Instructions (Addendum)
No pneumonia on CXR.  Use medications as prescribed.  No need for additional oral abx at this time.  We do not have a cheaper version of amitiza.  Based on exam, there is no indication of eye infection.

## 2016-08-21 NOTE — Progress Notes (Signed)
Subjective:  Patient ID: [name], female    DOB: 08-31-1931  Age: 83 y.o. MRN: [id number]  CC: Cough (coughing,swelling under right eye and Amitza---too expensive. )   HPI   Constipation: No improvement with OTC medications (colace, dulcolax, miralax). Has improvement with amitiza but it is expensive. Wants to know if there is a cheaper option to [place].  Swelling under eye has been present for long time. She thinks it might be due to infection.  Cough: Waxing and waning. Productive cough with clear to white sputum. Use of advair and spiriva as prescribed. Does not use xopenex as prescribed by pulmonology. Does not have nebulizer machine at home.  Outpatient Medications Prior to Visit  Medication Sig Dispense Refill  . acetaminophen (TYLENOL) 650 MG CR tablet Take 1,300 mg by mouth every 8 (eight) hours as needed for pain.    Marland Kitchen ADVAIR DISKUS 250-50 MCG/DOSE AEPB INHALE 1 PUFF TWICE DAILY  (30  DAY  EXPIRATION) 180 each 3  . AMITIZA 24 MCG capsule TAKE 1 CAPSULE TWICE DAILY WITH A MEAL 180 capsule 6  . atorvastatin (LIPITOR) 20 MG tablet TAKE 1 TABLET EVERY DAY 90 tablet 3  . B-D ULTRA-FINE 33 LANCETS MISC Use to help check blood sugars daily Dx E11.9 100 each 2  . Biotin (BIOTIN 5000) 5 MG CAPS Take 5 mg by mouth at bedtime.     Marland Kitchen ELIQUIS 5 MG TABS tablet TAKE 1 TABLET TWICE DAILY  (MUST  ESTABLISH  NEW  PRIMARY CARE PROVIDER  FOR FUTURE REFILLS) 180 tablet 3  . furosemide (LASIX) 40 MG tablet TAKE ONE TABLET BY MOUTH ONCE DAILY 90 tablet 1  . glucose blood (BAYER CONTOUR TEST) test strip 1 each by Other route daily. Use to check blood sugars every day Dx E11.9 90 each 3  . guaiFENesin (MUCINEX) 600 MG 12 hr tablet Take 1 tablet (600 mg total) by mouth 2 (two) times daily. 30 tablet 0  . levalbuterol (XOPENEX) 0.63 MG/3ML nebulizer solution Take 3 mLs (0.63 mg total) by nebulization every 6 (six) hours as needed for wheezing or shortness of breath. 75 mL 5  . losartan  (COZAAR) 25 MG tablet Take 0.5 tablets (12.5 mg total) by mouth daily. 30 tablet 0  . metFORMIN (GLUCOPHAGE) 500 MG tablet TAKE 1 TABLET TWICE DAILY WITH A MEAL 180 tablet 3  . montelukast (SINGULAIR) 10 MG tablet Take 1 tablet (10 mg total) by mouth at bedtime. 30 tablet 3  . Multiple Vitamins-Minerals (MULTIVITAMIN WITH MINERALS) tablet Take 1 tablet by mouth 2 (two) times daily.     . potassium chloride SA (K-DUR,KLOR-CON) 20 MEQ tablet Take 1 tablet (20 mEq total) by mouth daily. 76meq in am, 73meq in pm 30 tablet 0  . SPIRIVA HANDIHALER 18 MCG inhalation capsule INHALE THE CONTENTS OF 1 CAPSULE EVERY DAY 90 capsule 3  . levalbuterol (XOPENEX HFA) 45 MCG/ACT inhaler Inhale 2 puffs into the lungs every 6 (six) hours as needed. Reported on 09/23/2015     No facility-administered medications prior to visit.     ROS Review of Systems  HENT: Positive for congestion.   Eyes: Negative for blurred vision, pain, discharge and redness.  Respiratory: Positive for cough, shortness of breath and wheezing.   Cardiovascular: Negative for chest pain, palpitations, orthopnea, leg swelling and PND.  Neurological: Negative for dizziness.   Objective:  BP 108/60   Pulse 73   Temp 97.8 F (36.6 C)   Ht 5' 5.5" (1.664  m)   Wt 160 lb (72.6 kg)   SpO2 94%   BMI 26.22 kg/m   BP Readings from Last 3 Encounters:  08/21/16 108/60  08/14/16 (!) 110/56  08/12/16 110/70    Wt Readings from Last 3 Encounters:  08/21/16 160 lb (72.6 kg)  08/14/16 160 lb 12.8 oz (72.9 kg)  08/12/16 159 lb 12.8 oz (72.5 kg)    Physical Exam  Constitutional: She is oriented to person, place, and time. No distress.  HENT:  Mouth/Throat: No oropharyngeal exudate.  Eyes: Conjunctivae and EOM are normal. Pupils are equal, round, and reactive to light. Right eye exhibits no discharge. Left eye exhibits no discharge. No scleral icterus.  Neck: Normal range of motion. Neck supple.  Cardiovascular: Normal rate.     Pulmonary/Chest: Effort normal. She has wheezes. She has rales.  Musculoskeletal: She exhibits no edema.  Neurological: She is alert and oriented to person, place, and time.  Skin: Skin is warm and dry.  Vitals reviewed.   Lab Results  Component Value Date   WBC 9.9 05/03/2016   HGB 11.8 (L) 05/03/2016   HCT 35.9 (L) 05/03/2016   PLT 292 05/03/2016   GLUCOSE 127 (H) 05/03/2016   CHOL 133 11/22/2015   TRIG 205.0 (H) 11/22/2015   HDL 43.70 11/22/2015   LDLDIRECT 67.0 11/22/2015   LDLCALC 53 08/27/2011   ALT 46 05/01/2016   AST 36 05/01/2016   NA 140 05/03/2016   K 3.4 (L) 05/03/2016   CL 103 05/03/2016   CREATININE 0.99 05/03/2016   BUN 12 05/03/2016   CO2 26 05/03/2016   TSH 2.02 09/05/2013   INR 1.48 10/24/2014   HGBA1C 7.0 (H) 04/26/2016    Dg Chest 2 View  Result Date: 07/27/2016 CLINICAL DATA:  Obstructive bronchitis without exacerbation EXAM: CHEST  2 VIEW COMPARISON:  06/17/2016 FINDINGS: COPD with hyperinflation. Prominent lung markings diffusely and bilaterally unchanged due to chronic lung disease. Negative for pneumonia. No mass or effusion. Dual lead pacemaker unchanged. IMPRESSION: COPD with prominent lung markings. No acute abnormality and no change from the prior chest x-ray. Electronically Signed   By: Franchot Gallo M.D.   On: 07/27/2016 14:40    Assessment & Plan:   Tyjai was seen today for cough.  Diagnoses and all orders for this visit:  Gold stage C. COPD with frequent exacerbations -     DG Chest 2 View; Future -     ipratropium-albuterol (DUONEB) 0.5-2.5 (3) MG/3ML nebulizer solution 3 mL; Take 3 mLs by nebulization once. -     benzonatate (TESSALON) 100 MG capsule; Take 1 capsule (100 mg total) by mouth 3 (three) times daily as needed for cough. -     levalbuterol (XOPENEX HFA) 45 MCG/ACT inhaler; Inhale 2 puffs into the lungs every 6 (six) hours as needed. Reported on 09/23/2015  Cough due to bronchospasm -     DG Chest 2 View; Future -      ipratropium-albuterol (DUONEB) 0.5-2.5 (3) MG/3ML nebulizer solution 3 mL; Take 3 mLs by nebulization once. -     benzonatate (TESSALON) 100 MG capsule; Take 1 capsule (100 mg total) by mouth 3 (three) times daily as needed for cough. -     levalbuterol (XOPENEX HFA) 45 MCG/ACT inhaler; Inhale 2 puffs into the lungs every 6 (six) hours as needed. Reported on 09/23/2015   I am having Ms. Erdmann start on benzonatate. I am also having her maintain her multivitamin with minerals, acetaminophen, Biotin, levalbuterol, glucose blood, B-D  ULTRA-FINE 33 LANCETS, metFORMIN, ELIQUIS, AMITIZA, atorvastatin, montelukast, guaiFENesin, losartan, potassium chloride SA, furosemide, ADVAIR DISKUS, SPIRIVA HANDIHALER, and levalbuterol. We administered ipratropium-albuterol.  Meds ordered this encounter  Medications  . ipratropium-albuterol (DUONEB) 0.5-2.5 (3) MG/3ML nebulizer solution 3 mL  . benzonatate (TESSALON) 100 MG capsule    Sig: Take 1 capsule (100 mg total) by mouth 3 (three) times daily as needed for cough.    Dispense:  20 capsule    Refill:  0    Order Specific Question:   Supervising Provider    Answer:   Cassandria Anger [[id number]]  . levalbuterol (XOPENEX HFA) 45 MCG/ACT inhaler    Sig: Inhale 2 puffs into the lungs every 6 (six) hours as needed. Reported on 09/23/2015    Dispense:  1 Inhaler    Refill:  1    Order Specific Question:   Supervising Provider    Answer:   Cassandria Anger [[id number]]    Follow-up: Return if symptoms worsen or fail to improve.  Wilfred Lacy, NP

## 2016-08-24 ENCOUNTER — Ambulatory Visit (INDEPENDENT_AMBULATORY_CARE_PROVIDER_SITE_OTHER): Payer: Medicare Other | Admitting: *Deleted

## 2016-08-24 DIAGNOSIS — I442 Atrioventricular block, complete: Secondary | ICD-10-CM

## 2016-08-24 NOTE — Progress Notes (Signed)
Pacemaker check in clinic. Normal device function. Threshold and impedance consistent with previous measurements. Device programmed to maximize longevity. No high ventricular rates noted. Device programmed at appropriate safety margins. Histogram distribution appropriate for patient activity level. Device programmed to optimize intrinsic conduction. Estimated longevity 11 years. ROV w/ SK 02/2017. Patient education completed.

## 2016-08-27 ENCOUNTER — Other Ambulatory Visit: Payer: Self-pay

## 2016-08-27 ENCOUNTER — Ambulatory Visit (HOSPITAL_COMMUNITY): Payer: Medicare Other | Attending: Cardiology

## 2016-08-27 DIAGNOSIS — I272 Pulmonary hypertension, unspecified: Secondary | ICD-10-CM | POA: Diagnosis not present

## 2016-08-27 DIAGNOSIS — I481 Persistent atrial fibrillation: Secondary | ICD-10-CM | POA: Diagnosis not present

## 2016-08-27 DIAGNOSIS — I4819 Other persistent atrial fibrillation: Secondary | ICD-10-CM

## 2016-08-27 DIAGNOSIS — I5022 Chronic systolic (congestive) heart failure: Secondary | ICD-10-CM | POA: Insufficient documentation

## 2016-08-27 DIAGNOSIS — I700 Atherosclerosis of aorta: Secondary | ICD-10-CM | POA: Diagnosis not present

## 2016-09-01 ENCOUNTER — Other Ambulatory Visit: Payer: Self-pay | Admitting: Internal Medicine

## 2016-09-01 ENCOUNTER — Telehealth: Payer: Self-pay | Admitting: Internal Medicine

## 2016-09-01 DIAGNOSIS — R2681 Unsteadiness on feet: Secondary | ICD-10-CM | POA: Diagnosis not present

## 2016-09-01 DIAGNOSIS — M25511 Pain in right shoulder: Secondary | ICD-10-CM | POA: Diagnosis not present

## 2016-09-01 DIAGNOSIS — R262 Difficulty in walking, not elsewhere classified: Secondary | ICD-10-CM | POA: Diagnosis not present

## 2016-09-01 DIAGNOSIS — R278 Other lack of coordination: Secondary | ICD-10-CM | POA: Diagnosis not present

## 2016-09-01 DIAGNOSIS — M6281 Muscle weakness (generalized): Secondary | ICD-10-CM | POA: Diagnosis not present

## 2016-09-01 DIAGNOSIS — M25512 Pain in left shoulder: Secondary | ICD-10-CM | POA: Diagnosis not present

## 2016-09-01 NOTE — Telephone Encounter (Signed)
Please call [name] back regarding the Pts AMITIZA 24 MCG capsule  This medication is $300, she would like to know if there is a cheaper option   Please call back

## 2016-09-01 NOTE — Telephone Encounter (Signed)
Please advise 

## 2016-09-02 DIAGNOSIS — R2681 Unsteadiness on feet: Secondary | ICD-10-CM | POA: Diagnosis not present

## 2016-09-02 DIAGNOSIS — M6281 Muscle weakness (generalized): Secondary | ICD-10-CM | POA: Diagnosis not present

## 2016-09-02 DIAGNOSIS — R262 Difficulty in walking, not elsewhere classified: Secondary | ICD-10-CM | POA: Diagnosis not present

## 2016-09-02 DIAGNOSIS — M25511 Pain in right shoulder: Secondary | ICD-10-CM | POA: Diagnosis not present

## 2016-09-02 DIAGNOSIS — R278 Other lack of coordination: Secondary | ICD-10-CM | POA: Diagnosis not present

## 2016-09-02 DIAGNOSIS — M25512 Pain in left shoulder: Secondary | ICD-10-CM | POA: Diagnosis not present

## 2016-09-03 DIAGNOSIS — M25512 Pain in left shoulder: Secondary | ICD-10-CM | POA: Diagnosis not present

## 2016-09-03 DIAGNOSIS — M6281 Muscle weakness (generalized): Secondary | ICD-10-CM | POA: Diagnosis not present

## 2016-09-03 DIAGNOSIS — R278 Other lack of coordination: Secondary | ICD-10-CM | POA: Diagnosis not present

## 2016-09-03 DIAGNOSIS — M25511 Pain in right shoulder: Secondary | ICD-10-CM | POA: Diagnosis not present

## 2016-09-03 DIAGNOSIS — R262 Difficulty in walking, not elsewhere classified: Secondary | ICD-10-CM | POA: Diagnosis not present

## 2016-09-03 DIAGNOSIS — R2681 Unsteadiness on feet: Secondary | ICD-10-CM | POA: Diagnosis not present

## 2016-09-03 NOTE — Telephone Encounter (Signed)
There is no cheaper version of amitiza or linzess. Options are miralax or dulcosate, which are both OTC medications

## 2016-09-03 NOTE — Telephone Encounter (Signed)
Spoke with pt and Terril, verbalized understand.

## 2016-09-04 DIAGNOSIS — M6281 Muscle weakness (generalized): Secondary | ICD-10-CM | POA: Diagnosis not present

## 2016-09-04 DIAGNOSIS — R262 Difficulty in walking, not elsewhere classified: Secondary | ICD-10-CM | POA: Diagnosis not present

## 2016-09-04 DIAGNOSIS — M25512 Pain in left shoulder: Secondary | ICD-10-CM | POA: Diagnosis not present

## 2016-09-04 DIAGNOSIS — R2681 Unsteadiness on feet: Secondary | ICD-10-CM | POA: Diagnosis not present

## 2016-09-04 DIAGNOSIS — M25511 Pain in right shoulder: Secondary | ICD-10-CM | POA: Diagnosis not present

## 2016-09-04 DIAGNOSIS — R278 Other lack of coordination: Secondary | ICD-10-CM | POA: Diagnosis not present

## 2016-09-07 DIAGNOSIS — R278 Other lack of coordination: Secondary | ICD-10-CM | POA: Diagnosis not present

## 2016-09-07 DIAGNOSIS — M6281 Muscle weakness (generalized): Secondary | ICD-10-CM | POA: Diagnosis not present

## 2016-09-07 DIAGNOSIS — R2681 Unsteadiness on feet: Secondary | ICD-10-CM | POA: Diagnosis not present

## 2016-09-07 DIAGNOSIS — M25512 Pain in left shoulder: Secondary | ICD-10-CM | POA: Diagnosis not present

## 2016-09-07 DIAGNOSIS — R262 Difficulty in walking, not elsewhere classified: Secondary | ICD-10-CM | POA: Diagnosis not present

## 2016-09-07 DIAGNOSIS — M25511 Pain in right shoulder: Secondary | ICD-10-CM | POA: Diagnosis not present

## 2016-09-08 DIAGNOSIS — M25512 Pain in left shoulder: Secondary | ICD-10-CM | POA: Diagnosis not present

## 2016-09-08 DIAGNOSIS — M25511 Pain in right shoulder: Secondary | ICD-10-CM | POA: Diagnosis not present

## 2016-09-08 DIAGNOSIS — R262 Difficulty in walking, not elsewhere classified: Secondary | ICD-10-CM | POA: Diagnosis not present

## 2016-09-08 DIAGNOSIS — R2681 Unsteadiness on feet: Secondary | ICD-10-CM | POA: Diagnosis not present

## 2016-09-08 DIAGNOSIS — R278 Other lack of coordination: Secondary | ICD-10-CM | POA: Diagnosis not present

## 2016-09-08 DIAGNOSIS — M6281 Muscle weakness (generalized): Secondary | ICD-10-CM | POA: Diagnosis not present

## 2016-09-10 DIAGNOSIS — M25512 Pain in left shoulder: Secondary | ICD-10-CM | POA: Diagnosis not present

## 2016-09-10 DIAGNOSIS — M6281 Muscle weakness (generalized): Secondary | ICD-10-CM | POA: Diagnosis not present

## 2016-09-10 DIAGNOSIS — R278 Other lack of coordination: Secondary | ICD-10-CM | POA: Diagnosis not present

## 2016-09-10 DIAGNOSIS — R262 Difficulty in walking, not elsewhere classified: Secondary | ICD-10-CM | POA: Diagnosis not present

## 2016-09-10 DIAGNOSIS — Z961 Presence of intraocular lens: Secondary | ICD-10-CM | POA: Diagnosis not present

## 2016-09-10 DIAGNOSIS — R2681 Unsteadiness on feet: Secondary | ICD-10-CM | POA: Diagnosis not present

## 2016-09-10 DIAGNOSIS — E119 Type 2 diabetes mellitus without complications: Secondary | ICD-10-CM | POA: Diagnosis not present

## 2016-09-10 DIAGNOSIS — H04123 Dry eye syndrome of bilateral lacrimal glands: Secondary | ICD-10-CM | POA: Diagnosis not present

## 2016-09-10 DIAGNOSIS — M25511 Pain in right shoulder: Secondary | ICD-10-CM | POA: Diagnosis not present

## 2016-09-11 DIAGNOSIS — M25511 Pain in right shoulder: Secondary | ICD-10-CM | POA: Diagnosis not present

## 2016-09-11 DIAGNOSIS — R262 Difficulty in walking, not elsewhere classified: Secondary | ICD-10-CM | POA: Diagnosis not present

## 2016-09-11 DIAGNOSIS — R2681 Unsteadiness on feet: Secondary | ICD-10-CM | POA: Diagnosis not present

## 2016-09-11 DIAGNOSIS — M25512 Pain in left shoulder: Secondary | ICD-10-CM | POA: Diagnosis not present

## 2016-09-11 DIAGNOSIS — M6281 Muscle weakness (generalized): Secondary | ICD-10-CM | POA: Diagnosis not present

## 2016-09-11 DIAGNOSIS — R278 Other lack of coordination: Secondary | ICD-10-CM | POA: Diagnosis not present

## 2016-09-15 DIAGNOSIS — R278 Other lack of coordination: Secondary | ICD-10-CM | POA: Diagnosis not present

## 2016-09-15 DIAGNOSIS — M6281 Muscle weakness (generalized): Secondary | ICD-10-CM | POA: Diagnosis not present

## 2016-09-15 DIAGNOSIS — M25511 Pain in right shoulder: Secondary | ICD-10-CM | POA: Diagnosis not present

## 2016-09-15 DIAGNOSIS — R262 Difficulty in walking, not elsewhere classified: Secondary | ICD-10-CM | POA: Diagnosis not present

## 2016-09-15 DIAGNOSIS — R2681 Unsteadiness on feet: Secondary | ICD-10-CM | POA: Diagnosis not present

## 2016-09-15 DIAGNOSIS — M25512 Pain in left shoulder: Secondary | ICD-10-CM | POA: Diagnosis not present

## 2016-09-16 DIAGNOSIS — M25511 Pain in right shoulder: Secondary | ICD-10-CM | POA: Diagnosis not present

## 2016-09-16 DIAGNOSIS — R2681 Unsteadiness on feet: Secondary | ICD-10-CM | POA: Diagnosis not present

## 2016-09-16 DIAGNOSIS — M6281 Muscle weakness (generalized): Secondary | ICD-10-CM | POA: Diagnosis not present

## 2016-09-16 DIAGNOSIS — M25512 Pain in left shoulder: Secondary | ICD-10-CM | POA: Diagnosis not present

## 2016-09-16 DIAGNOSIS — R278 Other lack of coordination: Secondary | ICD-10-CM | POA: Diagnosis not present

## 2016-09-16 DIAGNOSIS — R262 Difficulty in walking, not elsewhere classified: Secondary | ICD-10-CM | POA: Diagnosis not present

## 2016-09-17 DIAGNOSIS — M25512 Pain in left shoulder: Secondary | ICD-10-CM | POA: Diagnosis not present

## 2016-09-17 DIAGNOSIS — R262 Difficulty in walking, not elsewhere classified: Secondary | ICD-10-CM | POA: Diagnosis not present

## 2016-09-17 DIAGNOSIS — R2681 Unsteadiness on feet: Secondary | ICD-10-CM | POA: Diagnosis not present

## 2016-09-17 DIAGNOSIS — M6281 Muscle weakness (generalized): Secondary | ICD-10-CM | POA: Diagnosis not present

## 2016-09-17 DIAGNOSIS — R278 Other lack of coordination: Secondary | ICD-10-CM | POA: Diagnosis not present

## 2016-09-17 DIAGNOSIS — M25511 Pain in right shoulder: Secondary | ICD-10-CM | POA: Diagnosis not present

## 2016-09-18 DIAGNOSIS — R278 Other lack of coordination: Secondary | ICD-10-CM | POA: Diagnosis not present

## 2016-09-18 DIAGNOSIS — M6281 Muscle weakness (generalized): Secondary | ICD-10-CM | POA: Diagnosis not present

## 2016-09-18 DIAGNOSIS — M25512 Pain in left shoulder: Secondary | ICD-10-CM | POA: Diagnosis not present

## 2016-09-18 DIAGNOSIS — R2681 Unsteadiness on feet: Secondary | ICD-10-CM | POA: Diagnosis not present

## 2016-09-18 DIAGNOSIS — R262 Difficulty in walking, not elsewhere classified: Secondary | ICD-10-CM | POA: Diagnosis not present

## 2016-09-18 DIAGNOSIS — M25511 Pain in right shoulder: Secondary | ICD-10-CM | POA: Diagnosis not present

## 2016-09-21 ENCOUNTER — Other Ambulatory Visit: Payer: Self-pay | Admitting: Nurse Practitioner

## 2016-09-21 ENCOUNTER — Other Ambulatory Visit: Payer: Self-pay | Admitting: Internal Medicine

## 2016-09-21 DIAGNOSIS — J9801 Acute bronchospasm: Secondary | ICD-10-CM

## 2016-09-21 DIAGNOSIS — J449 Chronic obstructive pulmonary disease, unspecified: Secondary | ICD-10-CM

## 2016-09-21 MED ORDER — BENZONATATE 100 MG PO CAPS: 100.0000 mg | ORAL_CAPSULE | Freq: Three times a day (TID) | ORAL | 0 refills | Status: DC | PRN

## 2016-09-21 NOTE — Telephone Encounter (Signed)
rx for Benzonatate and mucinex sent in but rx for Xopenex HFA is denied because pt has nebulizer for this med already---per Baldo Ash.

## 2016-09-21 NOTE — Telephone Encounter (Signed)
Can we send this in, last Prescribed by provider not in this office

## 2016-09-22 ENCOUNTER — Ambulatory Visit (INDEPENDENT_AMBULATORY_CARE_PROVIDER_SITE_OTHER): Payer: Medicare Other | Admitting: Pulmonary Disease

## 2016-09-22 ENCOUNTER — Encounter: Payer: Self-pay | Admitting: Pulmonary Disease

## 2016-09-22 VITALS — BP 110/62 | HR 86 | Ht 65.5 in | Wt 157.6 lb

## 2016-09-22 DIAGNOSIS — J9611 Chronic respiratory failure with hypoxia: Secondary | ICD-10-CM | POA: Diagnosis not present

## 2016-09-22 DIAGNOSIS — J449 Chronic obstructive pulmonary disease, unspecified: Secondary | ICD-10-CM

## 2016-09-22 DIAGNOSIS — R05 Cough: Secondary | ICD-10-CM | POA: Diagnosis not present

## 2016-09-22 DIAGNOSIS — R059 Cough, unspecified: Secondary | ICD-10-CM

## 2016-09-22 DIAGNOSIS — G4733 Obstructive sleep apnea (adult) (pediatric): Secondary | ICD-10-CM | POA: Diagnosis not present

## 2016-09-22 MED ORDER — ESOMEPRAZOLE MAGNESIUM 20 MG PO CPDR: 20.0000 mg | DELAYED_RELEASE_CAPSULE | Freq: Every day | ORAL | 1 refills | Status: DC

## 2016-09-22 MED ORDER — FLUTICASONE PROPIONATE 50 MCG/ACT NA SUSP: 2.0000 | Freq: Every day | NASAL | 2 refills | Status: DC

## 2016-09-22 MED ORDER — CETIRIZINE HCL 5 MG PO TABS: 5.0000 mg | ORAL_TABLET | Freq: Every day | ORAL | 1 refills | Status: DC

## 2016-09-22 NOTE — Progress Notes (Signed)
[facility]    [id number]    May 13, 1931  Primary Care Physician:Crawford, Real Cons, MD  Referring Physician: Hoyt Koch, MD [place], [place] [address]  Chief complaint:   Follow up for COPD GOLD C Heavy ex smoker OSA on CPAP with 2Lt O2  HPI: Mrs. Pla has history of COPD with frequent exacerbations in the past. She is a former patient of [name] and a heavy former smoker. She smoked one and half to 3 packs per day for nearly 60 years. She quit 2013.  She underwent right hip and total knee replacement in the past. She has apparently tolerated these procedures well. She has history of atrial fibrillation and failed several ablations. She has a permanent pacemaker. 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 She is not on amiodarone at present.  Mrs. Kassner was hospitalized in Feb 2018 with left lower lobe community-acquired pneumonia, sepsis, acute systolic heart failure, AKI. She was initially in the ICU requiring pressor, Bipap support. Echo showed EF 25-30 percent and she had elevated troponins which was felt to be secondary to demand ischemia.  Interim History: Feels about the same. Has some sinus congestion with nasal discharge. Still has cough with white mucus. Denies any wheezing, fevers, chills. She has not started using the CPAP on a regular basis as she feels there is a smell coming out of it.  Outpatient Encounter Prescriptions as of 09/22/2016  Medication Sig  . acetaminophen (TYLENOL) 650 MG CR tablet Take 1,300 mg by mouth every 8 (eight) hours as needed for pain.  Marland Kitchen ADVAIR DISKUS 250-50 MCG/DOSE AEPB INHALE 1 PUFF TWICE DAILY  (30  DAY  EXPIRATION)  . AMITIZA 24 MCG capsule TAKE 1 CAPSULE TWICE DAILY WITH A MEAL  . atorvastatin (LIPITOR) 20 MG tablet TAKE 1 TABLET EVERY DAY  . B-D ULTRA-FINE 33 LANCETS MISC Use to help  check blood sugars daily Dx E11.9  . benzonatate (TESSALON) 100 MG capsule Take 1 capsule (100 mg total) by mouth 3 (three) times daily as needed for cough.  . Biotin (BIOTIN 5000) 5 MG CAPS Take 5 mg by mouth at bedtime.   Marland Kitchen ELIQUIS 5 MG TABS tablet TAKE 1 TABLET TWICE DAILY  (MUST  ESTABLISH  NEW  PRIMARY CARE PROVIDER  FOR FUTURE REFILLS)  . furosemide (LASIX) 40 MG tablet TAKE ONE TABLET BY MOUTH ONCE DAILY  . glucose blood (BAYER CONTOUR TEST) test strip 1 each by Other route daily. Use to check blood sugars every day Dx E11.9  . levalbuterol (XOPENEX HFA) 45 MCG/ACT inhaler Inhale 2 puffs into the lungs every 6 (six) hours as needed. Reported on 09/23/2015  . levalbuterol (XOPENEX) 0.63 MG/3ML nebulizer solution Take 3 mLs (0.63 mg total) by nebulization every 6 (six) hours as needed for wheezing or shortness of breath.  . losartan (COZAAR) 25 MG tablet Take 0.5 tablets (12.5 mg total) by mouth daily.  . metFORMIN (GLUCOPHAGE) 500 MG tablet TAKE 1 TABLET TWICE DAILY WITH A MEAL  . montelukast (SINGULAIR) 10 MG tablet Take 1 tablet (10 mg total) by mouth at bedtime.  . MUCINEX 600 MG 12 hr tablet TAKE ONE TABLET TWICE DAILY  . Multiple Vitamins-Minerals (MULTIVITAMIN WITH MINERALS) tablet Take 1 tablet by mouth 2 (two) times daily.   . Potassium Chloride ER 20 MEQ TBCR TAKE  2 TABLETS IN THE MORNING  AND TAKE 2 TABLETS IN THE EVENING  . SPIRIVA HANDIHALER 18 MCG inhalation capsule INHALE THE CONTENTS OF 1 CAPSULE EVERY DAY   No facility-administered encounter medications on file as of 09/22/2016.     Allergies as of 09/22/2016 - Review Complete 09/22/2016  Allergen Reaction Noted  . Cholestatin Other (See Comments) 09/02/2010  . Sulfur Itching 01/13/2013    Past Medical History:  Diagnosis Date  . Acute blood loss anemia 04/20/2012  . AF (atrial fibrillation) ([facility])     AV ablation 9/09 [facility] per Dr Ola Spurr - AV node ablation 9/11 Dr Caryl Comes  . Amiodarone pulmonary toxicity   .  Asthma   . Bronchitis    hx of   . [name] (coronary artery disease)    (not sure of this 11/10  . CHF (congestive heart failure) ([facility])   . Chronic renal insufficiency, stage III (moderate)    CrCl about 60 ml/min  . CKD (chronic kidney disease) stage 3, GFR 30-59 ml/min   . Complication of anesthesia    "psycotic episode" after hip surg - resolved  . COPD (chronic obstructive pulmonary disease) ([facility])    emphysema -FeV1 73% DLCO 53% 5/09  . CVA (cerebral vascular accident) ([facility])    no residual effects evident to pt   . Depression 06/06/2012  . Diabetes ([facility])   . Diastolic heart failure    Acute on Chronic  . Eczema   . GERD (gastroesophageal reflux disease)   . History of skin cancer   . Hx of cardiovascular stress test    Lexiscan Myoview (09/2013):  No definite ischemia, EF 67%; low risk  . Hyperlipidemia   . Invasive ductal carcinoma of breast ([facility]) 2011   LEFT   . Mental disorder   . OA (osteoarthritis) of knee    RIGHT  . Pacemaker    Permanent  . PAD (peripheral artery disease) ([facility])    w/hx right iliac/SFA stenting and left and rt leg PTA  . Pain    pt states has pain in fingertips per right hand pt states has been told may be carpal tunnel  . Pneumonia    hx of   . Right sided sciatica   . Shortness of breath dyspnea    walking distances   . Sinoatrial node dysfunction ([facility])   . Sleep apnea    associated with hypersomnia uses O2 2L/M at night and during naps also uses CPAP  . Tobacco abuse   . Tremors of nervous system    hands bilat     Past Surgical History:  Procedure Laterality Date  . ATRIAL ABLATION SURGERY     x 2 - "did not help - had to have pacemaker"  . BREAST LUMPECTOMY     left breast  . CARDIAC CATHETERIZATION    . CATARACT EXTRACTION, BILATERAL     with IOL/Dr Katy Fitch  . EP IMPLANTABLE DEVICE N/A 02/13/2015   Procedure: PPM Generator Changeout-St. Jude device;  Surgeon: Deboraha Sprang, MD;  Location: [facility];  Service:  Cardiovascular;  Laterality: N/A;  . MASTECTOMY, PARTIAL  02/03/2010   Left/Dr Rosenbower  . ORIF ACETABULAR FRACTURE Left 04/19/2012   Procedure: OPEN REDUCTION INTERNAL FIXATION (ORIF) ACETABULAR FRACTURE;  Surgeon: Rozanna Box, MD;  Location: [facility];  Service: Orthopedics;  Laterality: Left;  . sun spot removed      forhead  . TOTAL HIP ARTHROPLASTY Left 10/31/2014   Procedure: LEFT TOTAL HIP ARTHROPLASTY  POSTERIOR  APPROACH;  Surgeon: Gaynelle Arabian, MD;  Location: [facility];  Service: Orthopedics;  Laterality: Left;  . TOTAL KNEE ARTHROPLASTY Right 10/16/2013   Procedure: RIGHT TOTAL KNEE ARTHROPLASTY;  Surgeon: Gearlean Alf, MD;  Location: [facility];  Service: Orthopedics;  Laterality: Right;  Marland Kitchen VASCULAR SURGERY     both legs     Family History  Problem Relation Age of Onset  . Heart disease Father     Social History   Social History  . Marital status: Married    Spouse name: N/A  . Number of children: N/A  . Years of education: N/A   Occupational History  . Not on file.   Social History Main Topics  . Smoking status: Former Smoker    Packs/day: 1.00    Years: 55.00    Types: Cigarettes    Quit date: 12/19/2011  . Smokeless tobacco: Never Used     Comment: started smoking at age 66--4 cigs per day.  smoked 2ppd the last 10 yrs  . Alcohol use Yes     Comment: wine (rare)  . Drug use: No  . Sexual activity: No   Other Topics Concern  . Not on file   Social History Narrative   HS Graduate; [employer].  Married '61.  2 sons - '70, '71; Dtrs - '64,'68;    6 grandchildren.  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 Interests -Tai-chi & water aerobics, gardening, active lifestyle.  Marriage a bit stressful - SO w/membory problems and difficult behavior. She denies any personal safety concerns. End of life care:  need to address at next OV              Review of systems: Review of Systems  Constitutional: Negative for fever and chills.  HENT: Negative.   Eyes: Negative for blurred vision.  Respiratory: as per HPI  Cardiovascular: Negative for chest pain and palpitations.  Gastrointestinal: Negative for vomiting, diarrhea, blood per rectum. Genitourinary: Negative for dysuria, urgency, frequency and hematuria.  Musculoskeletal: Negative for myalgias, back pain and joint pain.  Skin: Negative for itching and rash.  Neurological: Negative for dizziness, tremors, focal weakness, seizures and loss of consciousness.  Endo/Heme/Allergies: Negative for environmental allergies.  Psychiatric/Behavioral: Negative for depression, suicidal ideas and hallucinations.  All other systems reviewed and are negative.  Physical Exam: Blood pressure 110/62, pulse 86, height 5' 5.5" (1.664 m), weight 157 lb 9.6 oz (71.5 kg), SpO2 96 %. Gen:      No acute distress HEENT:  EOMI, sclera anicteric Neck:     No masses; no thyromegaly Lungs:    Clear to auscultation bilaterally; normal respiratory effort CV:         Regular rate and rhythm; no murmurs Abd:      + bowel sounds; soft, non-tender; no palpable masses, no distension Ext:    No edema; adequate peripheral perfusion Skin:      Warm and dry; no rash Neuro: alert and oriented x 3 Psych: normal mood and affect  Data Reviewed: Chest x-ray 04/25/16-left lower lobe airspace consolidation. Chest x-ray 05/15/16-improvement in left lower lobe consolidation Chest x-ray 06/17/16-hyperinflation, chronic interstitial changes. No acute abnormality Chest x-ray 07/27/16- hyperinflation, chronic interstitial changes. No change from before. Chest x-ray 08/21/16- hyperinflation, chronic interstitial changes. No change from before. I have reviewed all images personally.  PFTs (02/20/10) FVC  1.66 (56%) FEV1 1.289 (59%) F/F 74   Assessment:  #1 COPD GOLD D Still has some  congestion with cough and mucus production. She continues on Advair, Spiriva and will continue on the same She continues on supplemental oxygen. Sats remained adequate on ambulation in office today Suspect some of her congestion may be from sinus issues, GERD. For this we will start Zyrtec,  Flonase nasal spray and Nexium.  #2 Heavy ex smoker. She quit in 2013. Not a candidate for screening CT due to age.   #3 OSA Has not been using CPAP for the past 6 months. I stressed the importance of her getting back on it. She states that there is some smell from her mask and tubing. We get this cleaned out and use fresh water for the humidifier. If the smell continues then she'll need new equipment.  #4 Code status DNR/DNI  Plan/Recommendations: - Continue Advair, spiriva - Continue supplemental o2.  - Start using CPAP - Start zyrtec, flonase, nexium - Mucinex for congetion  Follow up in 1-2 months  Marshell Garfinkel MD [facility] Pager [phone] 09/22/2016, 1:52 PM  CC: Hoyt Koch, *

## 2016-09-22 NOTE — Patient Instructions (Addendum)
We will start you on flonase nasal spray Start zyrtec 5 mg daily Start nexium 20 mg daily We will check your O2 levels today on ambulation Please start using your CPAP regularly.   Return in 1 month

## 2016-09-23 ENCOUNTER — Telehealth: Payer: Self-pay | Admitting: Pulmonary Disease

## 2016-09-23 DIAGNOSIS — R2681 Unsteadiness on feet: Secondary | ICD-10-CM | POA: Diagnosis not present

## 2016-09-23 DIAGNOSIS — M25511 Pain in right shoulder: Secondary | ICD-10-CM | POA: Diagnosis not present

## 2016-09-23 DIAGNOSIS — J9801 Acute bronchospasm: Secondary | ICD-10-CM

## 2016-09-23 DIAGNOSIS — R262 Difficulty in walking, not elsewhere classified: Secondary | ICD-10-CM | POA: Diagnosis not present

## 2016-09-23 DIAGNOSIS — R278 Other lack of coordination: Secondary | ICD-10-CM | POA: Diagnosis not present

## 2016-09-23 DIAGNOSIS — M25512 Pain in left shoulder: Secondary | ICD-10-CM | POA: Diagnosis not present

## 2016-09-23 DIAGNOSIS — J449 Chronic obstructive pulmonary disease, unspecified: Secondary | ICD-10-CM

## 2016-09-23 DIAGNOSIS — M6281 Muscle weakness (generalized): Secondary | ICD-10-CM | POA: Diagnosis not present

## 2016-09-23 NOTE — Telephone Encounter (Signed)
Called pt, states that her xopenex inhaler is very expensive, wishes to switch to covered alternative Ventolin.  PM please advise if ok to switch from xopenex to ventolin.  Thanks

## 2016-09-23 NOTE — Telephone Encounter (Signed)
Spoke with pt's daughter (dpr on file), states that rx sent in for Nexium was sent to be taken at noon daily.  Pt pays for a medication service fee and wants to know if this rx can be taken in the morning or evening instead to help from paying an additional med administration fee.    If this is ok, we need to fax an order to change rx to [place] at [facility]- services is called [facility] Fax # [phone]   PM please advise.  Thanks.

## 2016-09-24 DIAGNOSIS — M6281 Muscle weakness (generalized): Secondary | ICD-10-CM | POA: Diagnosis not present

## 2016-09-24 DIAGNOSIS — R262 Difficulty in walking, not elsewhere classified: Secondary | ICD-10-CM | POA: Diagnosis not present

## 2016-09-24 DIAGNOSIS — M25511 Pain in right shoulder: Secondary | ICD-10-CM | POA: Diagnosis not present

## 2016-09-24 DIAGNOSIS — R2681 Unsteadiness on feet: Secondary | ICD-10-CM | POA: Diagnosis not present

## 2016-09-24 DIAGNOSIS — R278 Other lack of coordination: Secondary | ICD-10-CM | POA: Diagnosis not present

## 2016-09-24 DIAGNOSIS — M25512 Pain in left shoulder: Secondary | ICD-10-CM | POA: Diagnosis not present

## 2016-09-24 MED ORDER — LEVALBUTEROL TARTRATE 45 MCG/ACT IN AERO: 2.0000 | INHALATION_SPRAY | Freq: Four times a day (QID) | RESPIRATORY_TRACT | 6 refills | Status: DC | PRN

## 2016-09-24 MED ORDER — ESOMEPRAZOLE MAGNESIUM 20 MG PO CPDR: DELAYED_RELEASE_CAPSULE | ORAL | 1 refills | Status: DC

## 2016-09-24 NOTE — Telephone Encounter (Signed)
Spoke with patient's daughter and advised her of PM's recs. New order for the Nexium has been printed and faxed to [facility]. Daughter is aware. Nothing else needed at time of call.

## 2016-09-24 NOTE — Telephone Encounter (Signed)
OK to use the Nexium in morning or evening.  Marshell Garfinkel MD [facility] 09/24/2016, 1:04 PM

## 2016-09-24 NOTE — Telephone Encounter (Signed)
OK to switch from xopenex to ventolin. Just be aware that this can cause increased heart rate. but should be ok to try the switch  Marshell Garfinkel MD [facility] 09/24/2016, 1:07 PM

## 2016-09-24 NOTE — Telephone Encounter (Signed)
Called and spoke with pt and she stated that she did not want to change to the ventolin if this could cause her heart to race.  Pt stated that she wanted to stay on the xopenex HFA. Refills have been sent to the pharmacy.

## 2016-09-25 ENCOUNTER — Telehealth: Payer: Self-pay

## 2016-09-25 NOTE — Telephone Encounter (Signed)
Pt called stating that she is looking at a bank statement where [facility] had taken $1000 from her account for a shot. She was not sure of what date this was for. She did not know what an EOB was, she was not looking at a bill from [facility]. She was requesting her money back. "when you have figured it out you call me". This RN contacted billing dept to have them call the patient.

## 2016-09-25 NOTE — Telephone Encounter (Signed)
Pt requested a call back. No answer x2.

## 2016-09-28 DIAGNOSIS — R2681 Unsteadiness on feet: Secondary | ICD-10-CM | POA: Diagnosis not present

## 2016-09-28 DIAGNOSIS — R262 Difficulty in walking, not elsewhere classified: Secondary | ICD-10-CM | POA: Diagnosis not present

## 2016-09-28 DIAGNOSIS — M6281 Muscle weakness (generalized): Secondary | ICD-10-CM | POA: Diagnosis not present

## 2016-09-28 DIAGNOSIS — M25512 Pain in left shoulder: Secondary | ICD-10-CM | POA: Diagnosis not present

## 2016-09-28 DIAGNOSIS — R278 Other lack of coordination: Secondary | ICD-10-CM | POA: Diagnosis not present

## 2016-09-28 DIAGNOSIS — M25511 Pain in right shoulder: Secondary | ICD-10-CM | POA: Diagnosis not present

## 2016-09-29 DIAGNOSIS — R262 Difficulty in walking, not elsewhere classified: Secondary | ICD-10-CM | POA: Diagnosis not present

## 2016-09-29 DIAGNOSIS — M25512 Pain in left shoulder: Secondary | ICD-10-CM | POA: Diagnosis not present

## 2016-09-29 DIAGNOSIS — R278 Other lack of coordination: Secondary | ICD-10-CM | POA: Diagnosis not present

## 2016-09-29 DIAGNOSIS — M6281 Muscle weakness (generalized): Secondary | ICD-10-CM | POA: Diagnosis not present

## 2016-09-29 DIAGNOSIS — R2681 Unsteadiness on feet: Secondary | ICD-10-CM | POA: Diagnosis not present

## 2016-09-29 DIAGNOSIS — M25511 Pain in right shoulder: Secondary | ICD-10-CM | POA: Diagnosis not present

## 2016-09-30 DIAGNOSIS — M6281 Muscle weakness (generalized): Secondary | ICD-10-CM | POA: Diagnosis not present

## 2016-09-30 DIAGNOSIS — M25512 Pain in left shoulder: Secondary | ICD-10-CM | POA: Diagnosis not present

## 2016-09-30 DIAGNOSIS — R262 Difficulty in walking, not elsewhere classified: Secondary | ICD-10-CM | POA: Diagnosis not present

## 2016-09-30 DIAGNOSIS — R278 Other lack of coordination: Secondary | ICD-10-CM | POA: Diagnosis not present

## 2016-09-30 DIAGNOSIS — M25511 Pain in right shoulder: Secondary | ICD-10-CM | POA: Diagnosis not present

## 2016-09-30 DIAGNOSIS — R2681 Unsteadiness on feet: Secondary | ICD-10-CM | POA: Diagnosis not present

## 2016-10-02 DIAGNOSIS — R2681 Unsteadiness on feet: Secondary | ICD-10-CM | POA: Diagnosis not present

## 2016-10-02 DIAGNOSIS — R278 Other lack of coordination: Secondary | ICD-10-CM | POA: Diagnosis not present

## 2016-10-02 DIAGNOSIS — M25512 Pain in left shoulder: Secondary | ICD-10-CM | POA: Diagnosis not present

## 2016-10-02 DIAGNOSIS — M6281 Muscle weakness (generalized): Secondary | ICD-10-CM | POA: Diagnosis not present

## 2016-10-02 DIAGNOSIS — R262 Difficulty in walking, not elsewhere classified: Secondary | ICD-10-CM | POA: Diagnosis not present

## 2016-10-02 DIAGNOSIS — M25511 Pain in right shoulder: Secondary | ICD-10-CM | POA: Diagnosis not present

## 2016-10-05 DIAGNOSIS — M6281 Muscle weakness (generalized): Secondary | ICD-10-CM | POA: Diagnosis not present

## 2016-10-05 DIAGNOSIS — M25511 Pain in right shoulder: Secondary | ICD-10-CM | POA: Diagnosis not present

## 2016-10-05 DIAGNOSIS — R278 Other lack of coordination: Secondary | ICD-10-CM | POA: Diagnosis not present

## 2016-10-05 DIAGNOSIS — M25512 Pain in left shoulder: Secondary | ICD-10-CM | POA: Diagnosis not present

## 2016-10-05 DIAGNOSIS — R262 Difficulty in walking, not elsewhere classified: Secondary | ICD-10-CM | POA: Diagnosis not present

## 2016-10-05 DIAGNOSIS — R2681 Unsteadiness on feet: Secondary | ICD-10-CM | POA: Diagnosis not present

## 2016-10-06 ENCOUNTER — Ambulatory Visit: Payer: Medicare Other | Admitting: Pulmonary Disease

## 2016-10-06 DIAGNOSIS — R262 Difficulty in walking, not elsewhere classified: Secondary | ICD-10-CM | POA: Diagnosis not present

## 2016-10-06 DIAGNOSIS — M6281 Muscle weakness (generalized): Secondary | ICD-10-CM | POA: Diagnosis not present

## 2016-10-06 DIAGNOSIS — R2681 Unsteadiness on feet: Secondary | ICD-10-CM | POA: Diagnosis not present

## 2016-10-06 DIAGNOSIS — M25511 Pain in right shoulder: Secondary | ICD-10-CM | POA: Diagnosis not present

## 2016-10-06 DIAGNOSIS — M25512 Pain in left shoulder: Secondary | ICD-10-CM | POA: Diagnosis not present

## 2016-10-06 DIAGNOSIS — R278 Other lack of coordination: Secondary | ICD-10-CM | POA: Diagnosis not present

## 2016-10-07 ENCOUNTER — Other Ambulatory Visit: Payer: Self-pay | Admitting: Internal Medicine

## 2016-10-07 DIAGNOSIS — R2681 Unsteadiness on feet: Secondary | ICD-10-CM | POA: Diagnosis not present

## 2016-10-07 DIAGNOSIS — M25512 Pain in left shoulder: Secondary | ICD-10-CM | POA: Diagnosis not present

## 2016-10-07 DIAGNOSIS — R278 Other lack of coordination: Secondary | ICD-10-CM | POA: Diagnosis not present

## 2016-10-07 DIAGNOSIS — R262 Difficulty in walking, not elsewhere classified: Secondary | ICD-10-CM | POA: Diagnosis not present

## 2016-10-07 DIAGNOSIS — M25511 Pain in right shoulder: Secondary | ICD-10-CM | POA: Diagnosis not present

## 2016-10-07 DIAGNOSIS — M6281 Muscle weakness (generalized): Secondary | ICD-10-CM | POA: Diagnosis not present

## 2016-10-12 DIAGNOSIS — R278 Other lack of coordination: Secondary | ICD-10-CM | POA: Diagnosis not present

## 2016-10-12 DIAGNOSIS — M25511 Pain in right shoulder: Secondary | ICD-10-CM | POA: Diagnosis not present

## 2016-10-12 DIAGNOSIS — R2681 Unsteadiness on feet: Secondary | ICD-10-CM | POA: Diagnosis not present

## 2016-10-12 DIAGNOSIS — M6281 Muscle weakness (generalized): Secondary | ICD-10-CM | POA: Diagnosis not present

## 2016-10-12 DIAGNOSIS — M25512 Pain in left shoulder: Secondary | ICD-10-CM | POA: Diagnosis not present

## 2016-10-12 DIAGNOSIS — R262 Difficulty in walking, not elsewhere classified: Secondary | ICD-10-CM | POA: Diagnosis not present

## 2016-10-13 ENCOUNTER — Other Ambulatory Visit: Payer: Self-pay | Admitting: Internal Medicine

## 2016-10-13 DIAGNOSIS — M25512 Pain in left shoulder: Secondary | ICD-10-CM | POA: Diagnosis not present

## 2016-10-13 DIAGNOSIS — M6281 Muscle weakness (generalized): Secondary | ICD-10-CM | POA: Diagnosis not present

## 2016-10-13 DIAGNOSIS — R2681 Unsteadiness on feet: Secondary | ICD-10-CM | POA: Diagnosis not present

## 2016-10-13 DIAGNOSIS — R278 Other lack of coordination: Secondary | ICD-10-CM | POA: Diagnosis not present

## 2016-10-13 DIAGNOSIS — R262 Difficulty in walking, not elsewhere classified: Secondary | ICD-10-CM | POA: Diagnosis not present

## 2016-10-13 DIAGNOSIS — M25511 Pain in right shoulder: Secondary | ICD-10-CM | POA: Diagnosis not present

## 2016-10-14 ENCOUNTER — Telehealth: Payer: Self-pay | Admitting: Pulmonary Disease

## 2016-10-14 NOTE — Telephone Encounter (Signed)
Spoke with the pt  She states she has a CPAP but "it's missing some pieces"  She is unsure what exactly she is needing  [facility] and tried the ext that was given- it's wrong b/c there are only 3 numbers and [facility] ext have 4  L[facility]CB for [facility] with [facility] ext [phone]

## 2016-10-15 DIAGNOSIS — R278 Other lack of coordination: Secondary | ICD-10-CM | POA: Diagnosis not present

## 2016-10-15 DIAGNOSIS — R2681 Unsteadiness on feet: Secondary | ICD-10-CM | POA: Diagnosis not present

## 2016-10-15 DIAGNOSIS — M6281 Muscle weakness (generalized): Secondary | ICD-10-CM | POA: Diagnosis not present

## 2016-10-15 DIAGNOSIS — M25511 Pain in right shoulder: Secondary | ICD-10-CM | POA: Diagnosis not present

## 2016-10-15 DIAGNOSIS — M25512 Pain in left shoulder: Secondary | ICD-10-CM | POA: Diagnosis not present

## 2016-10-15 DIAGNOSIS — R262 Difficulty in walking, not elsewhere classified: Secondary | ICD-10-CM | POA: Diagnosis not present

## 2016-10-15 NOTE — Telephone Encounter (Signed)
Spoke with pt and informed her of Jason's message. Pt understood and will contact us next week if supplies does not come.

## 2016-10-15 NOTE — Telephone Encounter (Signed)
Jason with [facility]-Cpap supplies was shipped to patient's home yesterday. [phone] Ext: [phone]

## 2016-10-16 DIAGNOSIS — M6281 Muscle weakness (generalized): Secondary | ICD-10-CM | POA: Diagnosis not present

## 2016-10-16 DIAGNOSIS — M25511 Pain in right shoulder: Secondary | ICD-10-CM | POA: Diagnosis not present

## 2016-10-16 DIAGNOSIS — R262 Difficulty in walking, not elsewhere classified: Secondary | ICD-10-CM | POA: Diagnosis not present

## 2016-10-16 DIAGNOSIS — R2681 Unsteadiness on feet: Secondary | ICD-10-CM | POA: Diagnosis not present

## 2016-10-16 DIAGNOSIS — M25512 Pain in left shoulder: Secondary | ICD-10-CM | POA: Diagnosis not present

## 2016-10-16 DIAGNOSIS — R278 Other lack of coordination: Secondary | ICD-10-CM | POA: Diagnosis not present

## 2016-10-20 DIAGNOSIS — R2681 Unsteadiness on feet: Secondary | ICD-10-CM | POA: Diagnosis not present

## 2016-10-20 DIAGNOSIS — M25511 Pain in right shoulder: Secondary | ICD-10-CM | POA: Diagnosis not present

## 2016-10-20 DIAGNOSIS — R278 Other lack of coordination: Secondary | ICD-10-CM | POA: Diagnosis not present

## 2016-10-20 DIAGNOSIS — M6281 Muscle weakness (generalized): Secondary | ICD-10-CM | POA: Diagnosis not present

## 2016-10-20 DIAGNOSIS — R262 Difficulty in walking, not elsewhere classified: Secondary | ICD-10-CM | POA: Diagnosis not present

## 2016-10-20 DIAGNOSIS — M25512 Pain in left shoulder: Secondary | ICD-10-CM | POA: Diagnosis not present

## 2016-10-22 DIAGNOSIS — M25511 Pain in right shoulder: Secondary | ICD-10-CM | POA: Diagnosis not present

## 2016-10-22 DIAGNOSIS — R278 Other lack of coordination: Secondary | ICD-10-CM | POA: Diagnosis not present

## 2016-10-22 DIAGNOSIS — R2681 Unsteadiness on feet: Secondary | ICD-10-CM | POA: Diagnosis not present

## 2016-10-22 DIAGNOSIS — M25512 Pain in left shoulder: Secondary | ICD-10-CM | POA: Diagnosis not present

## 2016-10-22 DIAGNOSIS — R262 Difficulty in walking, not elsewhere classified: Secondary | ICD-10-CM | POA: Diagnosis not present

## 2016-10-22 DIAGNOSIS — M6281 Muscle weakness (generalized): Secondary | ICD-10-CM | POA: Diagnosis not present

## 2016-10-27 DIAGNOSIS — R262 Difficulty in walking, not elsewhere classified: Secondary | ICD-10-CM | POA: Diagnosis not present

## 2016-10-27 DIAGNOSIS — M25511 Pain in right shoulder: Secondary | ICD-10-CM | POA: Diagnosis not present

## 2016-10-27 DIAGNOSIS — M6281 Muscle weakness (generalized): Secondary | ICD-10-CM | POA: Diagnosis not present

## 2016-10-27 DIAGNOSIS — R2681 Unsteadiness on feet: Secondary | ICD-10-CM | POA: Diagnosis not present

## 2016-10-27 DIAGNOSIS — M25512 Pain in left shoulder: Secondary | ICD-10-CM | POA: Diagnosis not present

## 2016-10-27 DIAGNOSIS — R278 Other lack of coordination: Secondary | ICD-10-CM | POA: Diagnosis not present

## 2016-10-30 DIAGNOSIS — R2681 Unsteadiness on feet: Secondary | ICD-10-CM | POA: Diagnosis not present

## 2016-10-30 DIAGNOSIS — M25511 Pain in right shoulder: Secondary | ICD-10-CM | POA: Diagnosis not present

## 2016-10-30 DIAGNOSIS — R262 Difficulty in walking, not elsewhere classified: Secondary | ICD-10-CM | POA: Diagnosis not present

## 2016-10-30 DIAGNOSIS — M25512 Pain in left shoulder: Secondary | ICD-10-CM | POA: Diagnosis not present

## 2016-10-30 DIAGNOSIS — M6281 Muscle weakness (generalized): Secondary | ICD-10-CM | POA: Diagnosis not present

## 2016-10-30 DIAGNOSIS — R278 Other lack of coordination: Secondary | ICD-10-CM | POA: Diagnosis not present

## 2016-11-02 DIAGNOSIS — M6281 Muscle weakness (generalized): Secondary | ICD-10-CM | POA: Diagnosis not present

## 2016-11-02 DIAGNOSIS — R2681 Unsteadiness on feet: Secondary | ICD-10-CM | POA: Diagnosis not present

## 2016-11-02 DIAGNOSIS — M25512 Pain in left shoulder: Secondary | ICD-10-CM | POA: Diagnosis not present

## 2016-11-02 DIAGNOSIS — R278 Other lack of coordination: Secondary | ICD-10-CM | POA: Diagnosis not present

## 2016-11-02 DIAGNOSIS — R262 Difficulty in walking, not elsewhere classified: Secondary | ICD-10-CM | POA: Diagnosis not present

## 2016-11-02 DIAGNOSIS — M25511 Pain in right shoulder: Secondary | ICD-10-CM | POA: Diagnosis not present

## 2016-11-03 DIAGNOSIS — R278 Other lack of coordination: Secondary | ICD-10-CM | POA: Diagnosis not present

## 2016-11-03 DIAGNOSIS — M25511 Pain in right shoulder: Secondary | ICD-10-CM | POA: Diagnosis not present

## 2016-11-03 DIAGNOSIS — R2681 Unsteadiness on feet: Secondary | ICD-10-CM | POA: Diagnosis not present

## 2016-11-03 DIAGNOSIS — R262 Difficulty in walking, not elsewhere classified: Secondary | ICD-10-CM | POA: Diagnosis not present

## 2016-11-03 DIAGNOSIS — M25512 Pain in left shoulder: Secondary | ICD-10-CM | POA: Diagnosis not present

## 2016-11-03 DIAGNOSIS — M6281 Muscle weakness (generalized): Secondary | ICD-10-CM | POA: Diagnosis not present

## 2016-11-05 ENCOUNTER — Encounter: Payer: Self-pay | Admitting: Pulmonary Disease

## 2016-11-05 ENCOUNTER — Ambulatory Visit (INDEPENDENT_AMBULATORY_CARE_PROVIDER_SITE_OTHER): Payer: Medicare Other | Admitting: Pulmonary Disease

## 2016-11-05 ENCOUNTER — Other Ambulatory Visit: Payer: Self-pay | Admitting: Pulmonary Disease

## 2016-11-05 VITALS — BP 130/72 | HR 92 | Ht 65.5 in | Wt 156.8 lb

## 2016-11-05 DIAGNOSIS — J449 Chronic obstructive pulmonary disease, unspecified: Secondary | ICD-10-CM

## 2016-11-05 DIAGNOSIS — G4733 Obstructive sleep apnea (adult) (pediatric): Secondary | ICD-10-CM | POA: Diagnosis not present

## 2016-11-05 NOTE — Patient Instructions (Signed)
Please start using your CPAP on a regular basis Continue using the Advair and Spiriva Continue supplemental oxygen Continue Mucinex for congestion. Use flutter valve once a day for chest congestion  Return to clinic in 3 months.

## 2016-11-05 NOTE — Progress Notes (Addendum)
[name]    [id number]    [birth year]  Primary Care Physician:Crawford, Real Cons, MD  Referring Physician: Hoyt Koch, MD [place], [place] [id number]  Chief complaint:   Follow up for COPD GOLD C Heavy ex smoker OSA on CPAP with 2Lt O2  HPI: [name] has history of COPD with frequent exacerbations in the past. She is a former patient of [name] and a heavy former smoker. She smoked one and half to 3 packs per day for nearly 60 years. She quit 2013.  She underwent right hip and total knee replacement in the past. She has apparently tolerated these procedures well. She has history of atrial fibrillation and failed several ablations. She has a permanent pacemaker. 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 She is not on amiodarone at present.  [name] was hospitalized in Feb 2018 with left lower lobe community-acquired pneumonia, sepsis, acute systolic heart failure, AKI. She was initially in the ICU requiring pressor, Bipap support. Echo showed EF 25-30 percent and she had elevated troponins which was felt to be secondary to demand ischemia.  Interim History: [name] has her CPAP fixed with the equipment but has not started using it yet. She is convinced that she does not have OSA. She continues on the Advair and Spiriva. She has periods of increased cough with congestion but today is one of her good days. She denies any fevers, chills, worsening dyspnea over baseline.  She is planning a trip with her daughters is to the mountains, [place] later this month.  Outpatient Encounter Prescriptions as of 11/05/2016  Medication Sig  . acetaminophen (TYLENOL) 650 MG CR tablet Take 1,300 mg by mouth every 8 (eight) hours as needed for pain.  Marland Kitchen ADVAIR DISKUS 250-50 MCG/DOSE AEPB INHALE 1 PUFF TWICE DAILY  (30  DAY  EXPIRATION)  .  AMITIZA 24 MCG capsule TAKE 1 CAPSULE TWICE DAILY WITH A MEAL  . atorvastatin (LIPITOR) 20 MG tablet TAKE 1 TABLET EVERY DAY  . B-D ULTRA-FINE 33 LANCETS MISC Use to help check blood sugars daily Dx E11.9  . benzonatate (TESSALON) 100 MG capsule Take 1 capsule (100 mg total) by mouth 3 (three) times daily as needed for cough.  . Biotin (BIOTIN 5000) 5 MG CAPS Take 5 mg by mouth at bedtime.   . cetirizine (ZYRTEC) 5 MG tablet TAKE ONE TABLET DAILY  . ELIQUIS 5 MG TABS tablet TAKE 1 TABLET TWICE DAILY  (MUST  ESTABLISH  NEW  PRIMARY CARE PROVIDER  FOR FUTURE REFILLS)  . esomeprazole (NEXIUM) 20 MG capsule Take 1 capsule in the morning or afternoon.  Marland Kitchen esomeprazole (NEXIUM) 20 MG capsule TAKE ONE CAPSULE EACH DAY AT 12 NOON  . fluticasone (FLONASE) 50 MCG/ACT nasal spray Place 2 sprays into both nostrils daily.  . furosemide (LASIX) 40 MG tablet TAKE ONE TABLET BY MOUTH ONCE DAILY  . glucose blood (BAYER CONTOUR TEST) test strip 1 each by Other route daily. Use to check blood sugars every day Dx E11.9  . levalbuterol (XOPENEX HFA) 45 MCG/ACT inhaler Inhale 2 puffs into the lungs every 6 (six) hours as needed. Reported on 09/23/2015  . levalbuterol (XOPENEX) 0.63 MG/3ML nebulizer solution Take 3 mLs (0.63 mg total) by nebulization every 6 (six) hours as needed for wheezing or shortness of breath.  Marland Kitchen  losartan (COZAAR) 25 MG tablet Take 0.5 tablets (12.5 mg total) by mouth daily.  . metFORMIN (GLUCOPHAGE) 500 MG tablet TAKE 1 TABLET TWICE DAILY WITH A MEAL  . montelukast (SINGULAIR) 10 MG tablet TAKE ONE TABLET AT BEDTIME  . MUCINEX 600 MG 12 hr tablet TAKE ONE TABLET TWICE DAILY  . Multiple Vitamins-Minerals (MULTIVITAMIN WITH MINERALS) tablet Take 1 tablet by mouth 2 (two) times daily.   . Potassium Chloride ER 20 MEQ TBCR TAKE 2 TABLETS IN THE MORNING  AND TAKE 2 TABLETS IN THE EVENING  . SPIRIVA HANDIHALER 18 MCG inhalation capsule INHALE THE CONTENTS OF 1 CAPSULE EVERY DAY   No  facility-administered encounter medications on file as of 11/05/2016.     Allergies as of 11/05/2016 - Review Complete 11/05/2016  Allergen Reaction Noted  . Cholestatin Other (See Comments) 09/02/2010  . Sulfur Itching 01/13/2013    Past Medical History:  Diagnosis Date  . Acute blood loss anemia 04/20/2012  . AF (atrial fibrillation) ([facility])     AV ablation 9/09 [address] per Dr Ola Spurr - AV node ablation 9/11 Dr Caryl Comes  . Amiodarone pulmonary toxicity   . Asthma   . Bronchitis    hx of   . [name] (coronary artery disease)    (not sure of this 11/10  . CHF (congestive heart failure) ([facility])   . Chronic renal insufficiency, stage III (moderate)    CrCl about 60 ml/min  . CKD (chronic kidney disease) stage 3, GFR 30-59 ml/min   . Complication of anesthesia    "psycotic episode" after hip surg - resolved  . COPD (chronic obstructive pulmonary disease) ([facility])    emphysema -FeV1 73% DLCO 53% 5/09  . CVA (cerebral vascular accident) ([facility])    no residual effects evident to pt   . Depression 06/06/2012  . Diabetes ([facility])   . Diastolic heart failure    Acute on Chronic  . Eczema   . GERD (gastroesophageal reflux disease)   . History of skin cancer   . Hx of cardiovascular stress test    Lexiscan Myoview (09/2013):  No definite ischemia, EF 67%; low risk  . Hyperlipidemia   . Invasive ductal carcinoma of breast ([place]) 2011   LEFT   . Mental disorder   . OA (osteoarthritis) of knee    RIGHT  . Pacemaker    Permanent  . PAD (peripheral artery disease) (H[facility])    w/hx right iliac/SFA stenting and left and rt leg PTA  . Pain    pt states has pain in fingertips per right hand pt states has been told may be carpal tunnel  . Pneumonia    hx of   . Right sided sciatica   . Shortness of breath dyspnea    walking distances   . Sinoatrial node dysfunction (HCC)   . Sleep apnea    associated with hypersomnia uses O2 2L/M at night and during naps also uses CPAP  . Tobacco abuse   . Tremors of  nervous system    hands bilat     Past Surgical History:  Procedure Laterality Date  . ATRIAL ABLATION SURGERY     x 2 - "did not help - had to have pacemaker"  . BREAST LUMPECTOMY     left breast  . CARDIAC CATHETERIZATION    . CATARACT EXTRACTION, BILATERAL     with IOL/Dr Katy Fitch  . EP IMPLANTABLE DEVICE N/A 02/13/2015   Procedure: PPM Generator Changeout-St. Jude device;  Surgeon: Deboraha Sprang,  MD;  Location: [facility];  Service: Cardiovascular;  Laterality: N/A;  . MASTECTOMY, PARTIAL  02/03/2010   Left/Dr Rosenbower  . ORIF ACETABULAR FRACTURE Left 04/19/2012   Procedure: OPEN REDUCTION INTERNAL FIXATION (ORIF) ACETABULAR FRACTURE;  Surgeon: Rozanna Box, MD;  Location: [facility];  Service: Orthopedics;  Laterality: Left;  . sun spot removed      forhead  . TOTAL HIP ARTHROPLASTY Left 10/31/2014   Procedure: LEFT TOTAL HIP ARTHROPLASTY POSTERIOR  APPROACH;  Surgeon: Gaynelle Arabian, MD;  Location: [facility];  Service: Orthopedics;  Laterality: Left;  . TOTAL KNEE ARTHROPLASTY Right 10/16/2013   Procedure: RIGHT TOTAL KNEE ARTHROPLASTY;  Surgeon: Gearlean Alf, MD;  Location: [facility];  Service: Orthopedics;  Laterality: Right;  Marland Kitchen VASCULAR SURGERY     both legs     Family History  Problem Relation Age of Onset  . Heart disease Father     Social History   Social History  . Marital status: Married    Spouse name: N/A  . Number of children: N/A  . Years of education: N/A   Occupational History  . Not on file.   Social History Main Topics  . Smoking status: Former Smoker    Packs/day: 1.00    Years: 55.00    Types: Cigarettes    Quit date: 12/19/2011  . Smokeless tobacco: Never Used     Comment: started smoking at age 53--4 cigs per day.  smoked 2ppd the last 10 yrs  . Alcohol use Yes     Comment: wine (rare)  . Drug use: No  . Sexual activity: No   Other Topics Concern  . Not on file   Social History Narrative   HS Graduate; [employer].   Married '61.  2 sons - '70, '71; Dtrs - '64,'68;    6 grandchildren.  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 Interests -Tai-chi & water aerobics, gardening, active lifestyle.  Marriage a bit stressful - SO w/membory problems and difficult behavior. She denies any personal safety concerns. End of life care: need to address at next OV              Review of systems: Review of Systems  Constitutional: Negative for fever and chills.  HENT: Negative.   Eyes: Negative for blurred vision.  Respiratory: as per HPI  Cardiovascular: Negative for chest pain and palpitations.  Gastrointestinal: Negative for vomiting, diarrhea, blood per rectum. Genitourinary: Negative for dysuria, urgency, frequency and hematuria.  Musculoskeletal: Negative for myalgias, back pain and joint pain.  Skin: Negative for itching and rash.  Neurological: Negative for dizziness, tremors, focal weakness, seizures and loss of consciousness.  Endo/Heme/Allergies: Negative for environmental allergies.  Psychiatric/Behavioral: Negative for depression, suicidal ideas and hallucinations.  All other systems reviewed and are negative.  Physical Exam: Blood pressure 130/72, pulse 92, height 5' 5.5" (1.664 m), weight 156 lb 12.8 oz (71.1 kg), SpO2 93 %. Gen:      No acute distress HEENT:  EOMI, sclera anicteric Neck:     No masses; no thyromegaly Lungs:    Scattered crackles, no wheeze; normal respiratory effort CV:         Regular rate and rhythm; no murmurs Abd:      + bowel sounds; soft, non-tender; no palpable masses, no distension Ext:    No edema; adequate peripheral perfusion  Skin:      Warm and dry; no rash Neuro: alert and oriented x 3 Psych: normal mood and affect  Data Reviewed: Chest x-ray 04/25/16-left lower lobe airspace consolidation. Chest x-ray  05/15/16-improvement in left lower lobe consolidation Chest x-ray 06/17/16-hyperinflation, chronic interstitial changes. No acute abnormality Chest x-ray 07/27/16- hyperinflation, chronic interstitial changes. No change from before. Chest x-ray 08/21/16- hyperinflation, chronic interstitial changes. No change from before. I have reviewed all images personally.  PFTs (02/20/10) FVC 1.66 (56%) FEV1 1.289 (59%) F/F 74   Assessment:  #1 COPD GOLD D Still has some congestion with cough and mucus production. She continues on Advair, Spiriva and will continue on the same She continues on supplemental oxygen. Sats remained adequate on ambulation in office today  #2 Sinusitis, GERD Suspect some of her congestion may be from sinus issues, GERD. For this we will [name] Zyrtec,  Flonase nasal spray and Nexium.  #2 Heavy ex smoker. She quit in 2013. Not a candidate for screening CT due to age.   #3 OSA She continues to be noncompliant with CPAP. I asked her to the noncompliant with that. She is not convinced that she has OSA. We can consider doing a repeat sleep study but she has deferred this for now.  #4 Code status DNR/DNI  Plan/Recommendations: - Continue Advair, spiriva - Continue supplemental o2.  - [name] using CPAP - [name] zyrtec, flonase, nexium - Mucinex for congetion  Follow up in 3 months  Marshell Garfinkel MD [facility] Pager [phone] 11/05/2016, 4:28 PM  CC: Hoyt Koch, *

## 2016-11-12 DIAGNOSIS — R2681 Unsteadiness on feet: Secondary | ICD-10-CM | POA: Diagnosis not present

## 2016-11-12 DIAGNOSIS — R278 Other lack of coordination: Secondary | ICD-10-CM | POA: Diagnosis not present

## 2016-11-12 DIAGNOSIS — M6281 Muscle weakness (generalized): Secondary | ICD-10-CM | POA: Diagnosis not present

## 2016-11-12 DIAGNOSIS — R262 Difficulty in walking, not elsewhere classified: Secondary | ICD-10-CM | POA: Diagnosis not present

## 2016-11-13 ENCOUNTER — Other Ambulatory Visit: Payer: Self-pay | Admitting: Internal Medicine

## 2016-11-13 DIAGNOSIS — R278 Other lack of coordination: Secondary | ICD-10-CM | POA: Diagnosis not present

## 2016-11-13 DIAGNOSIS — M6281 Muscle weakness (generalized): Secondary | ICD-10-CM | POA: Diagnosis not present

## 2016-11-13 DIAGNOSIS — R262 Difficulty in walking, not elsewhere classified: Secondary | ICD-10-CM | POA: Diagnosis not present

## 2016-11-13 DIAGNOSIS — R2681 Unsteadiness on feet: Secondary | ICD-10-CM | POA: Diagnosis not present

## 2016-11-18 DIAGNOSIS — R278 Other lack of coordination: Secondary | ICD-10-CM | POA: Diagnosis not present

## 2016-11-18 DIAGNOSIS — R2681 Unsteadiness on feet: Secondary | ICD-10-CM | POA: Diagnosis not present

## 2016-11-18 DIAGNOSIS — R262 Difficulty in walking, not elsewhere classified: Secondary | ICD-10-CM | POA: Diagnosis not present

## 2016-11-18 DIAGNOSIS — M6281 Muscle weakness (generalized): Secondary | ICD-10-CM | POA: Diagnosis not present

## 2016-11-20 DIAGNOSIS — R278 Other lack of coordination: Secondary | ICD-10-CM | POA: Diagnosis not present

## 2016-11-20 DIAGNOSIS — R262 Difficulty in walking, not elsewhere classified: Secondary | ICD-10-CM | POA: Diagnosis not present

## 2016-11-20 DIAGNOSIS — M6281 Muscle weakness (generalized): Secondary | ICD-10-CM | POA: Diagnosis not present

## 2016-11-20 DIAGNOSIS — R2681 Unsteadiness on feet: Secondary | ICD-10-CM | POA: Diagnosis not present

## 2016-11-24 DIAGNOSIS — R278 Other lack of coordination: Secondary | ICD-10-CM | POA: Diagnosis not present

## 2016-11-24 DIAGNOSIS — M6281 Muscle weakness (generalized): Secondary | ICD-10-CM | POA: Diagnosis not present

## 2016-11-24 DIAGNOSIS — R262 Difficulty in walking, not elsewhere classified: Secondary | ICD-10-CM | POA: Diagnosis not present

## 2016-11-24 DIAGNOSIS — R2681 Unsteadiness on feet: Secondary | ICD-10-CM | POA: Diagnosis not present

## 2016-11-27 ENCOUNTER — Ambulatory Visit: Payer: Medicare Other | Admitting: Nurse Practitioner

## 2016-11-28 ENCOUNTER — Inpatient Hospital Stay (HOSPITAL_COMMUNITY)
Admission: EM | Admit: 2016-11-28 | Discharge: 2016-12-11 | DRG: 871 | Disposition: A | Discharge status: Other Acute Inpatient Hospital | Payer: Medicare Other | Attending: Internal Medicine | Admitting: Internal Medicine

## 2016-11-28 ENCOUNTER — Ambulatory Visit (INDEPENDENT_AMBULATORY_CARE_PROVIDER_SITE_OTHER): Payer: Medicare Other | Admitting: Family Medicine

## 2016-11-28 ENCOUNTER — Encounter: Payer: Self-pay | Admitting: Family Medicine

## 2016-11-28 ENCOUNTER — Encounter (HOSPITAL_COMMUNITY): Payer: Self-pay | Admitting: Emergency Medicine

## 2016-11-28 ENCOUNTER — Emergency Department (HOSPITAL_COMMUNITY): Payer: Medicare Other

## 2016-11-28 VITALS — BP 122/74 | Temp 98.4°F | Wt 151.0 lb

## 2016-11-28 DIAGNOSIS — J9601 Acute respiratory failure with hypoxia: Secondary | ICD-10-CM | POA: Diagnosis not present

## 2016-11-28 DIAGNOSIS — Z9989 Dependence on other enabling machines and devices: Secondary | ICD-10-CM | POA: Diagnosis not present

## 2016-11-28 DIAGNOSIS — Z853 Personal history of malignant neoplasm of breast: Secondary | ICD-10-CM

## 2016-11-28 DIAGNOSIS — Z9189 Other specified personal risk factors, not elsewhere classified: Secondary | ICD-10-CM

## 2016-11-28 DIAGNOSIS — Z950 Presence of cardiac pacemaker: Secondary | ICD-10-CM | POA: Diagnosis not present

## 2016-11-28 DIAGNOSIS — Z79899 Other long term (current) drug therapy: Secondary | ICD-10-CM

## 2016-11-28 DIAGNOSIS — E1151 Type 2 diabetes mellitus with diabetic peripheral angiopathy without gangrene: Secondary | ICD-10-CM | POA: Diagnosis present

## 2016-11-28 DIAGNOSIS — Z23 Encounter for immunization: Secondary | ICD-10-CM | POA: Diagnosis not present

## 2016-11-28 DIAGNOSIS — N183 Chronic kidney disease, stage 3 unspecified: Secondary | ICD-10-CM | POA: Diagnosis present

## 2016-11-28 DIAGNOSIS — R918 Other nonspecific abnormal finding of lung field: Secondary | ICD-10-CM | POA: Diagnosis not present

## 2016-11-28 DIAGNOSIS — R6521 Severe sepsis with septic shock: Secondary | ICD-10-CM | POA: Diagnosis present

## 2016-11-28 DIAGNOSIS — Z794 Long term (current) use of insulin: Secondary | ICD-10-CM

## 2016-11-28 DIAGNOSIS — Z9842 Cataract extraction status, left eye: Secondary | ICD-10-CM

## 2016-11-28 DIAGNOSIS — E785 Hyperlipidemia, unspecified: Secondary | ICD-10-CM | POA: Diagnosis present

## 2016-11-28 DIAGNOSIS — R451 Restlessness and agitation: Secondary | ICD-10-CM | POA: Diagnosis not present

## 2016-11-28 DIAGNOSIS — A419 Sepsis, unspecified organism: Principal | ICD-10-CM | POA: Diagnosis present

## 2016-11-28 DIAGNOSIS — E1122 Type 2 diabetes mellitus with diabetic chronic kidney disease: Secondary | ICD-10-CM | POA: Diagnosis present

## 2016-11-28 DIAGNOSIS — I712 Thoracic aortic aneurysm, without rupture: Secondary | ICD-10-CM | POA: Diagnosis not present

## 2016-11-28 DIAGNOSIS — I2510 Atherosclerotic heart disease of native coronary artery without angina pectoris: Secondary | ICD-10-CM | POA: Diagnosis present

## 2016-11-28 DIAGNOSIS — N170 Acute kidney failure with tubular necrosis: Secondary | ICD-10-CM | POA: Diagnosis present

## 2016-11-28 DIAGNOSIS — N179 Acute kidney failure, unspecified: Secondary | ICD-10-CM | POA: Diagnosis not present

## 2016-11-28 DIAGNOSIS — R4182 Altered mental status, unspecified: Secondary | ICD-10-CM

## 2016-11-28 DIAGNOSIS — R1310 Dysphagia, unspecified: Secondary | ICD-10-CM | POA: Diagnosis present

## 2016-11-28 DIAGNOSIS — G4733 Obstructive sleep apnea (adult) (pediatric): Secondary | ICD-10-CM | POA: Diagnosis present

## 2016-11-28 DIAGNOSIS — E118 Type 2 diabetes mellitus with unspecified complications: Secondary | ICD-10-CM | POA: Diagnosis not present

## 2016-11-28 DIAGNOSIS — Z7189 Other specified counseling: Secondary | ICD-10-CM | POA: Diagnosis not present

## 2016-11-28 DIAGNOSIS — Z515 Encounter for palliative care: Secondary | ICD-10-CM | POA: Diagnosis present

## 2016-11-28 DIAGNOSIS — Z96642 Presence of left artificial hip joint: Secondary | ICD-10-CM | POA: Diagnosis present

## 2016-11-28 DIAGNOSIS — J441 Chronic obstructive pulmonary disease with (acute) exacerbation: Secondary | ICD-10-CM | POA: Diagnosis present

## 2016-11-28 DIAGNOSIS — Z8673 Personal history of transient ischemic attack (TIA), and cerebral infarction without residual deficits: Secondary | ICD-10-CM

## 2016-11-28 DIAGNOSIS — J690 Pneumonitis due to inhalation of food and vomit: Secondary | ICD-10-CM | POA: Diagnosis present

## 2016-11-28 DIAGNOSIS — Z9981 Dependence on supplemental oxygen: Secondary | ICD-10-CM

## 2016-11-28 DIAGNOSIS — I501 Left ventricular failure: Secondary | ICD-10-CM

## 2016-11-28 DIAGNOSIS — J449 Chronic obstructive pulmonary disease, unspecified: Secondary | ICD-10-CM | POA: Diagnosis not present

## 2016-11-28 DIAGNOSIS — J9611 Chronic respiratory failure with hypoxia: Secondary | ICD-10-CM | POA: Diagnosis not present

## 2016-11-28 DIAGNOSIS — R197 Diarrhea, unspecified: Secondary | ICD-10-CM | POA: Diagnosis present

## 2016-11-28 DIAGNOSIS — Z85828 Personal history of other malignant neoplasm of skin: Secondary | ICD-10-CM

## 2016-11-28 DIAGNOSIS — J810 Acute pulmonary edema: Secondary | ICD-10-CM | POA: Diagnosis not present

## 2016-11-28 DIAGNOSIS — I5043 Acute on chronic combined systolic (congestive) and diastolic (congestive) heart failure: Secondary | ICD-10-CM | POA: Diagnosis present

## 2016-11-28 DIAGNOSIS — Z96651 Presence of right artificial knee joint: Secondary | ICD-10-CM | POA: Diagnosis present

## 2016-11-28 DIAGNOSIS — Z7901 Long term (current) use of anticoagulants: Secondary | ICD-10-CM

## 2016-11-28 DIAGNOSIS — I7121 Aneurysm of the ascending aorta, without rupture: Secondary | ICD-10-CM | POA: Diagnosis present

## 2016-11-28 DIAGNOSIS — E860 Dehydration: Secondary | ICD-10-CM | POA: Diagnosis present

## 2016-11-28 DIAGNOSIS — R069 Unspecified abnormalities of breathing: Secondary | ICD-10-CM | POA: Diagnosis not present

## 2016-11-28 DIAGNOSIS — J189 Pneumonia, unspecified organism: Secondary | ICD-10-CM | POA: Diagnosis not present

## 2016-11-28 DIAGNOSIS — J9621 Acute and chronic respiratory failure with hypoxia: Secondary | ICD-10-CM | POA: Diagnosis present

## 2016-11-28 DIAGNOSIS — I495 Sick sinus syndrome: Secondary | ICD-10-CM | POA: Diagnosis not present

## 2016-11-28 DIAGNOSIS — R0602 Shortness of breath: Secondary | ICD-10-CM | POA: Diagnosis not present

## 2016-11-28 DIAGNOSIS — Z9010 Acquired absence of unspecified breast and nipple: Secondary | ICD-10-CM

## 2016-11-28 DIAGNOSIS — R911 Solitary pulmonary nodule: Secondary | ICD-10-CM | POA: Diagnosis present

## 2016-11-28 DIAGNOSIS — K5900 Constipation, unspecified: Secondary | ICD-10-CM | POA: Diagnosis not present

## 2016-11-28 DIAGNOSIS — E876 Hypokalemia: Secondary | ICD-10-CM | POA: Diagnosis not present

## 2016-11-28 DIAGNOSIS — I130 Hypertensive heart and chronic kidney disease with heart failure and stage 1 through stage 4 chronic kidney disease, or unspecified chronic kidney disease: Secondary | ICD-10-CM | POA: Diagnosis present

## 2016-11-28 DIAGNOSIS — Z961 Presence of intraocular lens: Secondary | ICD-10-CM | POA: Diagnosis present

## 2016-11-28 DIAGNOSIS — J440 Chronic obstructive pulmonary disease with acute lower respiratory infection: Secondary | ICD-10-CM | POA: Diagnosis present

## 2016-11-28 DIAGNOSIS — K219 Gastro-esophageal reflux disease without esophagitis: Secondary | ICD-10-CM | POA: Diagnosis not present

## 2016-11-28 DIAGNOSIS — I4891 Unspecified atrial fibrillation: Secondary | ICD-10-CM | POA: Diagnosis present

## 2016-11-28 DIAGNOSIS — I5032 Chronic diastolic (congestive) heart failure: Secondary | ICD-10-CM | POA: Diagnosis present

## 2016-11-28 DIAGNOSIS — J9690 Respiratory failure, unspecified, unspecified whether with hypoxia or hypercapnia: Secondary | ICD-10-CM | POA: Diagnosis not present

## 2016-11-28 DIAGNOSIS — I482 Chronic atrial fibrillation: Secondary | ICD-10-CM | POA: Diagnosis present

## 2016-11-28 DIAGNOSIS — R0902 Hypoxemia: Secondary | ICD-10-CM

## 2016-11-28 DIAGNOSIS — Z87891 Personal history of nicotine dependence: Secondary | ICD-10-CM

## 2016-11-28 DIAGNOSIS — Z66 Do not resuscitate: Secondary | ICD-10-CM | POA: Diagnosis present

## 2016-11-28 DIAGNOSIS — J9600 Acute respiratory failure, unspecified whether with hypoxia or hypercapnia: Secondary | ICD-10-CM

## 2016-11-28 DIAGNOSIS — I480 Paroxysmal atrial fibrillation: Secondary | ICD-10-CM | POA: Diagnosis present

## 2016-11-28 DIAGNOSIS — E1121 Type 2 diabetes mellitus with diabetic nephropathy: Secondary | ICD-10-CM | POA: Diagnosis not present

## 2016-11-28 DIAGNOSIS — J681 Pulmonary edema due to chemicals, gases, fumes and vapors: Secondary | ICD-10-CM | POA: Diagnosis not present

## 2016-11-28 DIAGNOSIS — E1165 Type 2 diabetes mellitus with hyperglycemia: Secondary | ICD-10-CM | POA: Diagnosis present

## 2016-11-28 DIAGNOSIS — I5022 Chronic systolic (congestive) heart failure: Secondary | ICD-10-CM | POA: Diagnosis not present

## 2016-11-28 DIAGNOSIS — J90 Pleural effusion, not elsewhere classified: Secondary | ICD-10-CM | POA: Diagnosis not present

## 2016-11-28 DIAGNOSIS — D631 Anemia in chronic kidney disease: Secondary | ICD-10-CM | POA: Diagnosis present

## 2016-11-28 DIAGNOSIS — Z9841 Cataract extraction status, right eye: Secondary | ICD-10-CM

## 2016-11-28 HISTORY — DX: Thoracic aortic aneurysm, without rupture: I71.2

## 2016-11-28 LAB — COMPREHENSIVE METABOLIC PANEL
ALBUMIN: 2.7 g/dL — AB (ref 3.5–5.0)
ALT: 9 U/L — ABNORMAL LOW (ref 14–54)
ANION GAP: 11 (ref 5–15)
AST: 16 U/L (ref 15–41)
Alkaline Phosphatase: 76 U/L (ref 38–126)
BILIRUBIN TOTAL: 1.1 mg/dL (ref 0.3–1.2)
BUN: 20 mg/dL (ref 6–20)
CALCIUM: 8.8 mg/dL — AB (ref 8.9–10.3)
CO2: 21 mmol/L — ABNORMAL LOW (ref 22–32)
Chloride: 102 mmol/L (ref 101–111)
Creatinine, Ser: 1.53 mg/dL — ABNORMAL HIGH (ref 0.44–1.00)
GFR, EST AFRICAN AMERICAN: 35 mL/min — AB (ref >60)
GFR, EST NON AFRICAN AMERICAN: 30 mL/min — AB (ref >60)
Glucose, Bld: 204 mg/dL — ABNORMAL HIGH (ref 65–99)
POTASSIUM: 4.2 mmol/L (ref 3.5–5.1)
SODIUM: 134 mmol/L — AB (ref 135–145)
TOTAL PROTEIN: 7.9 g/dL (ref 6.5–8.1)

## 2016-11-28 LAB — CBC WITH DIFFERENTIAL/PLATELET
BASOS ABS: 0 10*3/uL (ref 0.0–0.1)
BASOS PCT: 0 %
EOS PCT: 0 %
Eosinophils Absolute: 0 10*3/uL (ref 0.0–0.7)
HEMATOCRIT: 35.4 % — AB (ref 36.0–46.0)
Hemoglobin: 11.7 g/dL — ABNORMAL LOW (ref 12.0–15.0)
Lymphocytes Relative: 8 %
Lymphs Abs: 2 10*3/uL (ref 0.7–4.0)
MCH: 28 pg (ref 26.0–34.0)
MCHC: 33.1 g/dL (ref 30.0–36.0)
MCV: 84.7 fL (ref 78.0–100.0)
MONO ABS: 0.9 10*3/uL (ref 0.1–1.0)
Monocytes Relative: 4 %
NEUTROS ABS: 20.6 10*3/uL — AB (ref 1.7–7.7)
Neutrophils Relative %: 88 %
PLATELETS: 244 10*3/uL (ref 150–400)
RBC: 4.18 MIL/uL (ref 3.87–5.11)
RDW: 17.2 % — AB (ref 11.5–15.5)
WBC: 23.5 10*3/uL — ABNORMAL HIGH (ref 4.0–10.5)

## 2016-11-28 LAB — LACTIC ACID, PLASMA: Lactic Acid, Venous: 2.6 mmol/L (ref 0.5–1.9)

## 2016-11-28 LAB — I-STAT ARTERIAL BLOOD GAS, ED
ACID-BASE DEFICIT: 7 mmol/L — AB (ref 0.0–2.0)
BICARBONATE: 17.1 mmol/L — AB (ref 20.0–28.0)
O2 Saturation: 98 %
PO2 ART: 110 mmHg — AB (ref 83.0–108.0)
TCO2: 18 mmol/L — AB (ref 22–32)
pCO2 arterial: 29.1 mmHg — ABNORMAL LOW (ref 32.0–48.0)
pH, Arterial: 7.376 (ref 7.350–7.450)

## 2016-11-28 LAB — PROTIME-INR
INR: 2.45
PROTHROMBIN TIME: 26.4 s — AB (ref 11.4–15.2)

## 2016-11-28 LAB — I-STAT CG4 LACTIC ACID, ED
LACTIC ACID, VENOUS: 2.53 mmol/L — AB (ref 0.5–1.9)
Lactic Acid, Venous: 3.25 mmol/L (ref 0.5–1.9)

## 2016-11-28 LAB — I-STAT TROPONIN, ED: TROPONIN I, POC: 0 ng/mL (ref 0.00–0.08)

## 2016-11-28 LAB — PROCALCITONIN: PROCALCITONIN: 0.92 ng/mL

## 2016-11-28 LAB — BRAIN NATRIURETIC PEPTIDE: B NATRIURETIC PEPTIDE 5: 156.7 pg/mL — AB (ref 0.0–100.0)

## 2016-11-28 MED ORDER — IPRATROPIUM-ALBUTEROL 0.5-2.5 (3) MG/3ML IN SOLN
3.0000 mL | RESPIRATORY_TRACT | Status: DC
  Administered 2016-11-29 – 2016-11-30 (×8): 3 mL via RESPIRATORY_TRACT
  Filled 2016-11-28 (×11): qty 3

## 2016-11-28 MED ORDER — DEXTROSE 5 % IV SOLN: 500.0000 mg | INTRAVENOUS | Status: DC

## 2016-11-28 MED ORDER — GUAIFENESIN ER 600 MG PO TB12
600.0000 mg | ORAL_TABLET | Freq: Two times a day (BID) | ORAL | Status: DC
  Administered 2016-11-29 – 2016-12-05 (×8): 600 mg via ORAL
  Filled 2016-11-28 (×13): qty 1

## 2016-11-28 MED ORDER — TIOTROPIUM BROMIDE MONOHYDRATE 18 MCG IN CAPS
18.0000 ug | ORAL_CAPSULE | Freq: Every day | RESPIRATORY_TRACT | Status: DC
  Administered 2016-11-29 – 2016-11-30 (×2): 18 ug via RESPIRATORY_TRACT
  Filled 2016-11-28: qty 5

## 2016-11-28 MED ORDER — MONTELUKAST SODIUM 10 MG PO TABS
10.0000 mg | ORAL_TABLET | Freq: Every day | ORAL | Status: DC
  Administered 2016-11-29 – 2016-12-10 (×10): 10 mg via ORAL
  Filled 2016-11-28 (×13): qty 1

## 2016-11-28 MED ORDER — IPRATROPIUM-ALBUTEROL 0.5-2.5 (3) MG/3ML IN SOLN
3.0000 mL | Freq: Once | RESPIRATORY_TRACT | Status: AC
  Administered 2016-11-28: 3 mL via RESPIRATORY_TRACT
  Filled 2016-11-28: qty 3

## 2016-11-28 MED ORDER — ATORVASTATIN CALCIUM 20 MG PO TABS
20.0000 mg | ORAL_TABLET | Freq: Every day | ORAL | Status: DC
  Administered 2016-11-30 – 2016-12-11 (×8): 20 mg via ORAL
  Filled 2016-11-28 (×11): qty 1

## 2016-11-28 MED ORDER — PANTOPRAZOLE SODIUM 40 MG PO TBEC
40.0000 mg | DELAYED_RELEASE_TABLET | Freq: Every day | ORAL | Status: DC
  Administered 2016-11-30 – 2016-12-06 (×5): 40 mg via ORAL
  Filled 2016-11-28 (×5): qty 1

## 2016-11-28 MED ORDER — SODIUM CHLORIDE 0.9 % IV BOLUS (SEPSIS)
1000.0000 mL | Freq: Once | INTRAVENOUS | Status: AC
  Administered 2016-11-28: 1000 mL via INTRAVENOUS

## 2016-11-28 MED ORDER — DEXTROSE 5 % IV SOLN
1.0000 g | Freq: Once | INTRAVENOUS | Status: AC
  Administered 2016-11-28: 1 g via INTRAVENOUS
  Filled 2016-11-28: qty 10

## 2016-11-28 MED ORDER — LORATADINE 10 MG PO TABS
10.0000 mg | ORAL_TABLET | Freq: Every day | ORAL | Status: DC
  Administered 2016-11-30 – 2016-12-11 (×8): 10 mg via ORAL
  Filled 2016-11-28 (×11): qty 1

## 2016-11-28 MED ORDER — VANCOMYCIN HCL IN DEXTROSE 750-5 MG/150ML-% IV SOLN
750.0000 mg | INTRAVENOUS | Status: DC
  Administered 2016-11-29: 750 mg via INTRAVENOUS
  Filled 2016-11-28: qty 150

## 2016-11-28 MED ORDER — SODIUM CHLORIDE 0.9 % IV SOLN
INTRAVENOUS | Status: DC
  Administered 2016-11-29 – 2016-11-30 (×4): via INTRAVENOUS

## 2016-11-28 MED ORDER — VANCOMYCIN HCL IN DEXTROSE 1-5 GM/200ML-% IV SOLN
1000.0000 mg | Freq: Once | INTRAVENOUS | Status: AC
  Administered 2016-11-29: 1000 mg via INTRAVENOUS
  Filled 2016-11-28: qty 200

## 2016-11-28 MED ORDER — SODIUM CHLORIDE 0.9 % IV SOLN
0.0000 ug/min | INTRAVENOUS | Status: DC
  Administered 2016-11-28: 20 ug/min via INTRAVENOUS
  Administered 2016-11-29: 40 ug/min via INTRAVENOUS
  Filled 2016-11-28 (×3): qty 1

## 2016-11-28 MED ORDER — SODIUM CHLORIDE 0.9 % IV BOLUS (SEPSIS)
500.0000 mL | Freq: Once | INTRAVENOUS | Status: AC
  Administered 2016-11-28: 500 mL via INTRAVENOUS

## 2016-11-28 MED ORDER — ALBUTEROL SULFATE (2.5 MG/3ML) 0.083% IN NEBU: 2.5000 mg | INHALATION_SOLUTION | RESPIRATORY_TRACT | Status: DC | PRN

## 2016-11-28 MED ORDER — APIXABAN 5 MG PO TABS
5.0000 mg | ORAL_TABLET | Freq: Two times a day (BID) | ORAL | Status: DC
  Administered 2016-11-29 – 2016-12-11 (×18): 5 mg via ORAL
  Filled 2016-11-28 (×24): qty 1

## 2016-11-28 MED ORDER — ACETAMINOPHEN 325 MG PO TABS: 650.0000 mg | ORAL_TABLET | Freq: Four times a day (QID) | ORAL | Status: DC | PRN

## 2016-11-28 MED ORDER — SODIUM CHLORIDE 0.9 % IV BOLUS (SEPSIS): 250.0000 mL | Freq: Once | INTRAVENOUS | Status: DC

## 2016-11-28 MED ORDER — FLUTICASONE PROPIONATE 50 MCG/ACT NA SUSP: 2.0000 | Freq: Every day | NASAL | Status: DC

## 2016-11-28 MED ORDER — LEVOFLOXACIN 500 MG PO TABS: 500.0000 mg | ORAL_TABLET | Freq: Every day | ORAL | 0 refills | Status: DC

## 2016-11-28 MED ORDER — ZOLPIDEM TARTRATE 5 MG PO TABS
5.0000 mg | ORAL_TABLET | Freq: Every evening | ORAL | Status: DC | PRN
  Administered 2016-12-03 – 2016-12-07 (×4): 5 mg via ORAL
  Filled 2016-11-28 (×4): qty 1

## 2016-11-28 MED ORDER — PIPERACILLIN-TAZOBACTAM 3.375 G IVPB
3.3750 g | Freq: Three times a day (TID) | INTRAVENOUS | Status: DC
  Administered 2016-11-29 – 2016-11-30 (×5): 3.375 g via INTRAVENOUS
  Filled 2016-11-28 (×6): qty 50

## 2016-11-28 MED ORDER — DEXTROSE 5 % IV SOLN
500.0000 mg | Freq: Once | INTRAVENOUS | Status: AC
  Administered 2016-11-28: 500 mg via INTRAVENOUS
  Filled 2016-11-28: qty 500

## 2016-11-28 MED ORDER — INSULIN ASPART 100 UNIT/ML ~~LOC~~ SOLN: 0.0000 [IU] | Freq: Three times a day (TID) | SUBCUTANEOUS | Status: DC

## 2016-11-28 MED ORDER — PREDNISONE 20 MG PO TABS: ORAL_TABLET | ORAL | 0 refills | Status: DC

## 2016-11-28 MED ORDER — ADULT MULTIVITAMIN W/MINERALS CH
1.0000 | ORAL_TABLET | Freq: Two times a day (BID) | ORAL | Status: DC
  Administered 2016-11-29 – 2016-12-10 (×17): 1 via ORAL
  Filled 2016-11-28 (×23): qty 1

## 2016-11-28 MED ORDER — BIOTIN 5 MG PO CAPS: 5.0000 mg | ORAL_CAPSULE | Freq: Every day | ORAL | Status: DC

## 2016-11-28 MED ORDER — INSULIN ASPART 100 UNIT/ML ~~LOC~~ SOLN: 0.0000 [IU] | Freq: Every day | SUBCUTANEOUS | Status: DC

## 2016-11-28 MED ORDER — ONDANSETRON HCL 4 MG/2ML IJ SOLN
4.0000 mg | Freq: Three times a day (TID) | INTRAMUSCULAR | Status: DC | PRN
  Administered 2016-11-30 – 2016-12-08 (×2): 4 mg via INTRAVENOUS
  Filled 2016-11-28 (×2): qty 2

## 2016-11-28 NOTE — ED Notes (Signed)
Attempted report.  Informed that charge nurse had not approved yet.

## 2016-11-28 NOTE — Progress Notes (Signed)
OFFICE VISIT  11/28/2016   CC:  Chief Complaint  Patient presents with  . Cough    productive green/brown sputum... pt reports for months...pt has tried all ofer Rx meds and inhalers with no improvement in Sx  . Fatigue    HPI:    Patient is a 83 y.o.  female who presents accompanied by her daughter for cough and fatigue. Has COPD, is on 2 L oxygen 24/7, usually oxygenates low to mid 90s on this. Unclear duration of sx's--both daughter and pt are extremely vague about this---at least 2 wks in my estimation-- has worsening cough productive of green/brown mucous. Feeling tired--not much luck getting her to use her CPAp in recent weeks.  She is afraid it is unsanitary and will give her pneumonia. No known fever.  Pt very poor historian.  Basically just keeps repeating that she feels bad, says sx's are same as when she had pneumonia in the past. PO intake of solids down but this is purposeful.  Drinking plenty.   No difference in her usual feeling of SOB.   Her pulm (Dr. Wonda Amis) rx'd a nasal spray and a GERD medication at last f/u visit. Pt reports compliance with spiriva and advair. Has xopenex but it is unclear if she is taking it or not.  ROS: No CP, no LE swelling, no abd pain, no n/v, no dizziness.  No focal weakness, no paresthesias.  Past Medical History:  Diagnosis Date  . Acute blood loss anemia 04/20/2012  . AF (atrial fibrillation) ([facility])     AV ablation 9/09 [place] per Dr Ola Spurr - AV node ablation 9/11 Dr Caryl Comes  . Amiodarone pulmonary toxicity   . Asthma   . Bronchitis    hx of   . [name] (coronary artery disease)    (not sure of this 11/10  . CHF (congestive heart failure) ([facility])   . Chronic renal insufficiency, stage III (moderate)    CrCl about 60 ml/min  . CKD (chronic kidney disease) stage 3, GFR 30-59 ml/min   . Complication of anesthesia    "psycotic episode" after hip surg - resolved  . COPD (chronic obstructive pulmonary disease) ([facility])    emphysema -FeV1  73% DLCO 53% 5/09  . CVA (cerebral vascular accident) ([facility])    no residual effects evident to pt   . Depression 06/06/2012  . Diabetes ([place])   . Diastolic heart failure    Acute on Chronic  . Eczema   . GERD (gastroesophageal reflux disease)   . History of skin cancer   . Hx of cardiovascular stress test    Lexiscan Myoview (09/2013):  No definite ischemia, EF 67%; low risk  . Hyperlipidemia   . Invasive ductal carcinoma of breast ([facility]) 2011   LEFT   . Mental disorder   . OA (osteoarthritis) of knee    RIGHT  . Pacemaker    Permanent  . PAD (peripheral artery disease) ([facility])    w/hx right iliac/SFA stenting and left and rt leg PTA  . Pain    pt states has pain in fingertips per right hand pt states has been told may be carpal tunnel  . Pneumonia    hx of   . Right sided sciatica   . Shortness of breath dyspnea    walking distances   . Sinoatrial node dysfunction ([facility])   . Sleep apnea    associated with hypersomnia uses O2 2L/M at night and during naps also uses CPAP  . Tobacco abuse   .  Tremors of nervous system    hands bilat     Past Surgical History:  Procedure Laterality Date  . ATRIAL ABLATION SURGERY     x 2 - "did not help - had to have pacemaker"  . BREAST LUMPECTOMY     left breast  . CARDIAC CATHETERIZATION    . CATARACT EXTRACTION, BILATERAL     with IOL/Dr Katy Fitch  . EP IMPLANTABLE DEVICE N/A 02/13/2015   Procedure: PPM Generator Changeout-St. Jude device;  Surgeon: Deboraha Sprang, MD;  Location: [facility];  Service: Cardiovascular;  Laterality: N/A;  . MASTECTOMY, PARTIAL  02/03/2010   Left/Dr Rosenbower  . ORIF ACETABULAR FRACTURE Left 04/19/2012   Procedure: OPEN REDUCTION INTERNAL FIXATION (ORIF) ACETABULAR FRACTURE;  Surgeon: Rozanna Box, MD;  Location: [facility];  Service: Orthopedics;  Laterality: Left;  . sun spot removed      forhead  . TOTAL HIP ARTHROPLASTY Left 10/31/2014   Procedure: LEFT TOTAL HIP ARTHROPLASTY POSTERIOR  APPROACH;   Surgeon: Gaynelle Arabian, MD;  [facility];  Service: Orthopedics;  Laterality: Left;  . TOTAL KNEE ARTHROPLASTY Right 10/16/2013   Procedure: RIGHT TOTAL KNEE ARTHROPLASTY;  Surgeon: Gearlean Alf, MD;  [facility];  Service: Orthopedics;  Laterality: Right;  Marland Kitchen VASCULAR SURGERY     both legs     Outpatient Medications Prior to Visit  Medication Sig Dispense Refill  . acetaminophen (TYLENOL) 650 MG CR tablet Take 1,300 mg by mouth every 8 (eight) hours as needed for pain.    Marland Kitchen ADVAIR DISKUS 250-50 MCG/DOSE AEPB INHALE 1 PUFF TWICE DAILY  (30  DAY  EXPIRATION) 180 each 3  . AMITIZA 24 MCG capsule TAKE 1 CAPSULE TWICE DAILY WITH A MEAL 180 capsule 6  . atorvastatin (LIPITOR) 20 MG tablet TAKE 1 TABLET EVERY DAY 90 tablet 3  . B-D ULTRA-FINE 33 LANCETS MISC Use to help check blood sugars daily Dx E11.9 100 each 2  . benzonatate (TESSALON) 100 MG capsule Take 1 capsule (100 mg total) by mouth 3 (three) times daily as needed for cough. 20 capsule 0  . Biotin (BIOTIN 5000) 5 MG CAPS Take 5 mg by mouth at bedtime.     . cetirizine (ZYRTEC) 5 MG tablet TAKE ONE TABLET DAILY 30 tablet 3  . ELIQUIS 5 MG TABS tablet TAKE 1 TABLET TWICE DAILY  (MUST  ESTABLISH  NEW  PRIMARY CARE PROVIDER  FOR FUTURE REFILLS) 180 tablet 3  . esomeprazole (NEXIUM) 20 MG capsule TAKE ONE CAPSULE EACH DAY AT 12 NOON 30 capsule 3  . fluticasone (FLONASE) 50 MCG/ACT nasal spray Place 2 sprays into both nostrils daily. 16 g 2  . furosemide (LASIX) 40 MG tablet TAKE ONE TABLET BY MOUTH ONCE DAILY 90 tablet 1  . glucose blood (BAYER CONTOUR TEST) test strip 1 each by Other route daily. Use to check blood sugars every day Dx E11.9 90 each 3  . levalbuterol (XOPENEX HFA) 45 MCG/ACT inhaler Inhale 2 puffs into the lungs every 6 (six) hours as needed. Reported on 09/23/2015 1 Inhaler 6  . losartan (COZAAR) 25 MG tablet Take 0.5 tablets (12.5 mg total) by mouth daily. 30 tablet 0  . metFORMIN (GLUCOPHAGE) 500 MG tablet TAKE  1 TABLET TWICE DAILY WITH A MEAL 180 tablet 1  . montelukast (SINGULAIR) 10 MG tablet TAKE ONE TABLET AT BEDTIME 30 tablet 0  . MUCINEX 600 MG 12 hr tablet TAKE ONE TABLET TWICE DAILY 60 tablet 0  .  Multiple Vitamins-Minerals (MULTIVITAMIN WITH MINERALS) tablet Take 1 tablet by mouth 2 (two) times daily.     . Potassium Chloride ER 20 MEQ TBCR TAKE 2 TABLETS IN THE MORNING  AND TAKE 2 TABLETS IN THE EVENING 360 tablet 3  . SPIRIVA HANDIHALER 18 MCG inhalation capsule INHALE THE CONTENTS OF 1 CAPSULE EVERY DAY 90 capsule 3  . esomeprazole (NEXIUM) 20 MG capsule Take 1 capsule in the morning or afternoon. 30 capsule 1  . levalbuterol (XOPENEX) 0.63 MG/3ML nebulizer solution Take 3 mLs (0.63 mg total) by nebulization every 6 (six) hours as needed for wheezing or shortness of breath. (Patient not taking: Reported on 11/28/2016) 75 mL 5   No facility-administered medications prior to visit.     Allergies  Allergen Reactions  . Cholestatin Other (See Comments)    [place] SEASON...sneezing   . Sulfur Itching    ROS As per HPI  PE: Blood pressure 122/74, temperature 98.4 F (36.9 C), temperature source Oral, weight 151 lb (68.5 kg), SpO2 (!) 88 %. 2L oxyg [place].  This fluctuated during the visit from 88-92%. Gen: Alert, well appearing.  Patient is oriented to person, place, time, and situation. DEY:CXKG: no injection, icteris, swelling, or exudate.  EOMI, PERRLA. Mouth: lips without lesion/swelling.  Oral mucosa pink and dry. Oropharynx without erythema, exudate, or swelling.  CV: RRR, 2/6 systolic murmur, no diastolic murmur.  No rub/gallop. LUNGS: nonlabored resps but RR about 25-28, diffuse insp rhonchi in bases but this clears with coughing. No crackles.  No wheezing but her aeration is diminished with exhalation. EXT: no cyanosis.  Cap refill 1 sec.   LABS:    Chemistry      Component Value Date/Time   NA 140 05/03/2016 0505   NA 142 02/03/2016 1314   K 3.4 (L) 05/03/2016 0505   K  4.2 02/03/2016 1314   CL 103 05/03/2016 0505   CO2 26 05/03/2016 0505   CO2 25 02/03/2016 1314   BUN 12 05/03/2016 0505   BUN 16.2 02/03/2016 1314   CREATININE 0.99 05/03/2016 0505   CREATININE 1.2 (H) 02/03/2016 1314      Component Value Date/Time   CALCIUM 8.7 (L) 05/03/2016 0505   CALCIUM 9.8 02/03/2016 1314   ALKPHOS 72 05/01/2016 0439   ALKPHOS 94 02/03/2016 1314   AST 36 05/01/2016 0439   AST 21 02/03/2016 1314   ALT 46 05/01/2016 0439   ALT 13 02/03/2016 1314   BILITOT 0.9 05/01/2016 0439   BILITOT 0.44 02/03/2016 1314       IMPRESSION AND PLAN:  1) COPD exacerbation, getting progressively worse, with very mild dip in oxygenation from baseline. Prednisone 40 mg qd x 5d, then 20mg  qd x 5d, then 10mg  qd x 6d. Levaquin 500 mg qd x 10d. Continue 2L oxygen [place]---this is the highest her current oxygen tank can flow.  2) OSA, noncompliant with CPAP due to fear of germs it may have.  She has gotten all new tubing and mask. Strongly encouraged pt to start using this.  Spent 30 min with pt today, with >50% of this time spent in counseling and care coordination regarding the above problems.  An After Visit Summary was printed and given to the patient.  FOLLOW UP: Return for make appt with your primary MD in 4-5 days for recheck.  Signed:  Crissie Sickles, MD           11/28/2016

## 2016-11-28 NOTE — ED Notes (Signed)
EDP at bedside  

## 2016-11-28 NOTE — ED Provider Notes (Signed)
[facility] DEPT Provider Note   CSN: [id number] Arrival date & time: 11/28/16  1749     History   Chief Complaint No chief complaint on file.   HPI [name] is a 83 y.o. female.  HPI [name] is a 83 y.o. femalewith multiple medical problems, including coronary disease, CHF, COPD, chronic kidney disease stage III, [name], presents to emergency department from assisted living facility with complaint of shortness of breath. Patient states she has had symptoms for about 2 weeks. Patient states that the symptoms have gotten worse, and today her daughter-in-law talked her into coming to emergency department. Patient was found to be hypoxic with oxygen saturation in the low 80s upon arrival. Patient is on 2 L of oxygen at all times. Patient denies any pain. She states she is having productive cough. She denies any fever or chills. No chest pain, no back pain, no abdominal pain, no any other complaints.  Pt apparently was seen by PCP today, diagnosed with COPD exacerbation. Started on Levaquin (although unclear if pt had a dose today yet), prednisone.    Past Medical History:  Diagnosis Date  . Acute blood loss anemia 04/20/2012  . AF (atrial fibrillation) ([place])     AV ablation 9/09 [place] per Dr Ola Spurr - AV node ablation 9/11 Dr Caryl Comes  . Amiodarone pulmonary toxicity   . Asthma   . Bronchitis    hx of   . [name] (coronary artery disease)    (not sure of this 11/10  . CHF (congestive heart failure) ([place])   . Chronic renal insufficiency, stage III (moderate)    CrCl about 60 ml/min  . CKD (chronic kidney disease) stage 3, GFR 30-59 ml/min   . Complication of anesthesia    "psycotic episode" after hip surg - resolved  . COPD (chronic obstructive pulmonary disease) (HCC)    emphysema -FeV1 73% DLCO 53% 5/09  . CVA (cerebral vascular accident) ([place])    no residual effects evident to pt   . Depression 06/06/2012  . Diabetes ([place])   .  Diastolic heart failure    Acute on Chronic  . Eczema   . GERD (gastroesophageal reflux disease)   . History of skin cancer   . Hx of cardiovascular stress test    Lexiscan Myoview (09/2013):  No definite ischemia, EF 67%; low risk  . Hyperlipidemia   . Invasive ductal carcinoma of breast ([place]) 2011   LEFT   . Mental disorder   . OA (osteoarthritis) of knee    RIGHT  . Pacemaker    Permanent  . PAD (peripheral artery disease) (HCC)    w/hx right iliac/SFA stenting and left and rt leg PTA  . Pain    pt states has pain in fingertips per right hand pt states has been told may be carpal tunnel  . Pneumonia    hx of   . Right sided sciatica   . Shortness of breath dyspnea    walking distances   . Sinoatrial node dysfunction (HCC)   . Sleep apnea    associated with hypersomnia uses O2 2L/M at night and during naps also uses CPAP  . Tobacco abuse   . Tremors of nervous system    hands bilat     Patient Active Problem List   Diagnosis Date Noted  . Gait instability 06/22/2016  . Recurrent falls 06/22/2016  . Chronic respiratory failure ([place]) 05/15/2016  . SOB (shortness of breath)   .  Community acquired pneumonia 04/25/2016  . Rotator cuff tear arthropathy of both shoulders 12/09/2015  . Tremors of nervous system 05/21/2015  . Complete heart block ([place]) 02/13/2015  . Acute blood loss anemia 01/28/2015  . Constipation 11/07/2014  . OA (osteoarthritis) of hip 10/31/2014  . Chest pain, atypical 06/07/2014  . Osteoporosis 01/10/2014  . Nicotine dependence in remission 06/28/2013  . GERD (gastroesophageal reflux disease) 07/19/2012  . Depression 06/06/2012  . Secondary hyperparathyroidism ([place]) 04/25/2012  . Gold stage C. COPD with frequent exacerbations 12/15/2011  . Diastolic CHF, chronic ([place]) 12/15/2011  . Routine health maintenance 08/27/2011  . History of stroke 02/21/2010  . PACEMAKER, PERMANENT 02/21/2010  . Breast cancer of upper-inner quadrant of left female breast  ([place]) 02/03/2010  . Allergic rhinitis 12/31/2009  . Carotid stenosis 08/17/2008  . Controlled diabetes mellitus type 2 with complications ([place]) 16/12/9602  . Obstructive sleep apnea 06/01/2008  . Atrial fibrillation ([place]) 06/01/2008  . [name] 06/01/2008  . Hyperlipidemia 12/30/2006  . Coronary atherosclerosis 12/30/2006    Past Surgical History:  Procedure Laterality Date  . ATRIAL ABLATION SURGERY     x 2 - "did not help - had to have pacemaker"  . BREAST LUMPECTOMY     left breast  . CARDIAC CATHETERIZATION    . CATARACT EXTRACTION, BILATERAL     with IOL/Dr Katy Fitch  . EP IMPLANTABLE DEVICE N/A 02/13/2015   Procedure: PPM Generator Changeout-St[employer] Jude device;  Surgeon: Deboraha Sprang, MD;  Location: [facility];  Service: Cardiovascular;  Laterality: N/A;  . MASTECTOMY, PARTIAL  02/03/2010   Left/Dr Rosenbower  . ORIF ACETABULAR FRACTURE Left 04/19/2012   Procedure: OPEN REDUCTION INTERNAL FIXATION (ORIF) ACETABULAR FRACTURE;  Surgeon: Rozanna Box, MD;  Location: [facility];  Service: Orthopedics;  Laterality: Left;  . sun spot removed      forhead  . TOTAL HIP ARTHROPLASTY Left 10/31/2014   Procedure: LEFT TOTAL HIP ARTHROPLASTY POSTERIOR  APPROACH;  Surgeon: Gaynelle Arabian, MD;  Location: [facility];  Service: Orthopedics;  Laterality: Left;  . TOTAL KNEE ARTHROPLASTY Right 10/16/2013   Procedure: RIGHT TOTAL KNEE ARTHROPLASTY;  Surgeon: Gearlean Alf, MD;  Location: [facility];  Service: Orthopedics;  Laterality: Right;  Marland Kitchen VASCULAR SURGERY     both legs     OB History    No data available       Home Medications    Prior to Admission medications   Medication Sig Start Date End Date Taking? Authorizing Provider  acetaminophen (TYLENOL) 650 MG CR tablet Take 1,300 mg by mouth every 8 (eight) hours as needed for pain.    [provider]  ADVAIR DISKUS 250-50 MCG/DOSE AEPB INHALE 1 PUFF TWICE DAILY  (30  DAY  EXPIRATION) 08/12/16   Mannam, Hart Robinsons, MD  AMITIZA 24 MCG  capsule TAKE 1 CAPSULE TWICE DAILY WITH A MEAL 03/10/16   Hoyt Koch, MD  atorvastatin (LIPITOR) 20 MG tablet TAKE 1 TABLET EVERY DAY 03/10/16   Deboraha Sprang, MD  B-D ULTRA-FINE 33 LANCETS MISC Use to help check blood sugars daily Dx E11.9 07/30/15   Hoyt Koch, MD  benzonatate (TESSALON) 100 MG capsule Take 1 capsule (100 mg total) by mouth 3 (three) times daily as needed for cough. 09/21/16   Nche, Charlene Brooke, NP  Biotin (BIOTIN 5000) 5 MG CAPS Take 5 mg by mouth at bedtime.     [provider]  cetirizine (ZYRTEC) 5 MG tablet TAKE ONE TABLET DAILY 11/05/16  Mannam, Praveen, MD  ELIQUIS 5 MG TABS tablet TAKE 1 TABLET TWICE DAILY  (MUST  ESTABLISH  NEW  PRIMARY CARE PROVIDER  FOR FUTURE REFILLS) 11/14/15   Hoyt Koch, MD  esomeprazole (NEXIUM) 20 MG capsule TAKE ONE CAPSULE EACH DAY AT 12 NOON 11/05/16   Mannam, Praveen, MD  fluticasone (FLONASE) 50 MCG/ACT nasal spray Place 2 sprays into both nostrils daily. 09/22/16   Marshell Garfinkel, MD  furosemide (LASIX) 40 MG tablet TAKE ONE TABLET BY MOUTH ONCE DAILY 07/16/16   Hoyt Koch, MD  glucose blood (BAYER CONTOUR TEST) test strip 1 each by Other route daily. Use to check blood sugars every day Dx E11.9 07/18/15   Hoyt Koch, MD  levalbuterol [facility] HFA) 45 MCG/ACT inhaler Inhale 2 puffs into the lungs every 6 (six) hours as needed. Reported on 09/23/2015 09/24/16   Marshell Garfinkel, MD  levofloxacin (LEVAQUIN) 500 MG tablet Take 1 tablet (500 mg total) by mouth daily. 11/28/16   McGowen, Adrian Blackwater, MD  losartan (COZAAR) 25 MG tablet Take 0.5 tablets (12.5 mg total) by mouth daily. 05/04/16   Florencia Reasons, MD  metFORMIN (GLUCOPHAGE) 500 MG tablet TAKE 1 TABLET TWICE DAILY WITH A MEAL 10/07/16   Golden Circle, FNP  montelukast (SINGULAIR) 10 MG tablet TAKE ONE TABLET AT BEDTIME 11/13/16   Hoyt Koch, MD  MUCINEX 600 MG 12 hr tablet TAKE ONE TABLET TWICE DAILY 09/21/16   Nche, Charlene Brooke,  NP  Multiple Vitamins-Minerals (MULTIVITAMIN WITH MINERALS) tablet Take 1 tablet by mouth 2 (two) times daily.     [provider]  Potassium Chloride ER 20 MEQ TBCR TAKE 2 TABLETS IN THE MORNING  AND TAKE 2 TABLETS IN THE EVENING 09/02/16   Deboraha Sprang, MD  predniSONE (DELTASONE) 20 MG tablet 2 tabs po qd x 5d, then 1 tab po qd x 5d, then 1/2 tab po qd x 6d 11/28/16   McGowen, Adrian Blackwater, MD  SPIRIVA HANDIHALER 18 MCG inhalation capsule INHALE THE CONTENTS OF 1 CAPSULE EVERY DAY 08/12/16   Marshell Garfinkel, MD    Family History Family History  Problem Relation Age of Onset  . Heart disease Father     Social History Social History  Substance Use Topics  . Smoking status: Former Smoker    Packs/day: 1.00    Years: 55.00    Types: Cigarettes    Quit date: 12/19/2011  . Smokeless tobacco: Never Used     Comment: started smoking at age 80--4 cigs per day.  smoked 2ppd the last 10 yrs  . Alcohol use Yes     Comment: wine (rare)     Allergies   Cholestatin and Sulfur   Review of Systems Review of Systems  Constitutional: Negative for chills and fever.  Respiratory: Positive for cough and shortness of breath. Negative for chest tightness.   Cardiovascular: Negative for chest pain, palpitations and leg swelling.  Gastrointestinal: Negative for abdominal pain, diarrhea, nausea and vomiting.  Genitourinary: Negative for dysuria, flank pain and pelvic pain.  Musculoskeletal: Negative for arthralgias, myalgias, neck pain and neck stiffness.  Skin: Negative for rash.  Neurological: Negative for dizziness, weakness and headaches.  All other systems reviewed and are negative.    Physical Exam Updated Vital Signs There were no vitals taken for this visit.  Physical Exam  Constitutional: She is oriented to person, place, and time. She appears well-developed and well-nourished. No distress.  HENT:  Head: Normocephalic.  Eyes: Conjunctivae  are normal.  Neck: Neck supple.    Cardiovascular: Normal rate, regular rhythm and normal heart sounds.   Pulmonary/Chest: She is in respiratory distress. She has rales.  Rales and rhonchi in both lung fields. Patient is tachypneic. No accessory muscle use, speaking full sentences  Abdominal: Soft. Bowel sounds are normal. She exhibits no distension. There is no tenderness. There is no rebound.  Musculoskeletal: She exhibits no edema.  Neurological: She is alert and oriented to person, place, and time.  Skin: Skin is warm and dry.  Psychiatric: She has a normal mood and affect. Her behavior is normal.  Nursing note and vitals reviewed.    ED Treatments / Results  Labs (all labs ordered are listed, but only abnormal results are displayed) Labs Reviewed  CBC WITH DIFFERENTIAL/PLATELET - Abnormal; Notable for the following:       Result Value   WBC 23.5 (*)    Hemoglobin 11.7 (*)    HCT 35.4 (*)    RDW 17.2 (*)    Neutro Abs 20.6 (*)    All other components within normal limits  COMPREHENSIVE METABOLIC PANEL - Abnormal; Notable for the following:    Sodium 134 (*)    CO2 21 (*)    Glucose, Bld 204 (*)    Creatinine, Ser 1.53 (*)    Calcium 8.8 (*)    Albumin 2.7 (*)    ALT 9 (*)    GFR calc non Af Amer 30 (*)    GFR calc Af Amer 35 (*)    All other components within normal limits  BRAIN NATRIURETIC PEPTIDE - Abnormal; Notable for the following:    B Natriuretic Peptide 156.7 (*)    All other components within normal limits  LACTIC ACID, PLASMA - Abnormal; Notable for the following:    Lactic Acid, Venous 2.6 (*)    All other components within normal limits  PROTIME-INR - Abnormal; Notable for the following:    Prothrombin Time 26.4 (*)    All other components within normal limits  I-STAT CG4 LACTIC ACID, ED - Abnormal; Notable for the following:    Lactic Acid, Venous 2.53 (*)    All other components within normal limits  I-STAT ARTERIAL BLOOD GAS, ED - Abnormal; Notable for the following:    pCO2  arterial 29.1 (*)    pO2, Arterial 110.0 (*)    Bicarbonate 17.1 (*)    TCO2 18 (*)    Acid-base deficit 7.0 (*)    All other components within normal limits  I-STAT CG4 LACTIC ACID, ED - Abnormal; Notable for the following:    Lactic Acid, Venous 3.25 (*)    All other components within normal limits  CULTURE, BLOOD (ROUTINE X 2)  CULTURE, BLOOD (ROUTINE X 2)  URINE CULTURE  RESPIRATORY PANEL BY PCR  CULTURE, EXPECTORATED SPUTUM-ASSESSMENT  GRAM STAIN  PROCALCITONIN  URINALYSIS, ROUTINE W REFLEX MICROSCOPIC  CREATININE, URINE, RANDOM  UREA NITROGEN, URINE  STREP PNEUMONIAE URINARY ANTIGEN  LEGIONELLA PNEUMOPHILA SEROGP 1 UR AG  LACTIC ACID, PLASMA  I-STAT TROPONIN, ED    EKG  EKG Interpretation None       Radiology Dg Chest Portable 1 View  Result Date: 11/28/2016 CLINICAL DATA:  Hypoxia. EXAM: PORTABLE CHEST 1 VIEW COMPARISON:  08/11/2016 and 04/25/2016 FINDINGS: Left-sided pacemaker unchanged. Lungs are adequately inflated demonstrate mild hazy opacification over the right base slightly worse. Mild stable chronic interstitial prominence bilaterally. Cardiomediastinal silhouette is within normal. Mild degenerate change of the spine and shoulders.  IMPRESSION: Mild opacification over the right base which may be due to atelectasis or infection. Chronic stable interstitial prominence. Electronically Signed   By: Marin Olp M.D.   On: 11/28/2016 18:20    Procedures Procedures (including critical care time)   CRITICAL CARE Performed by: Jeannett Senior A Total critical care time: 30 minutes Critical care time was exclusive of separately billable procedures and treating other patients. Critical care was necessary to treat or prevent imminent or life-threatening deterioration. Critical care was time spent personally by me on the following activities: development of treatment plan with patient and/or surrogate as well as nursing, discussions with consultants, evaluation  of patient's response to treatment, examination of patient, obtaining history from patient or surrogate, ordering and performing treatments and interventions, ordering and review of laboratory studies, ordering and review of radiographic studies, pulse oximetry and re-evaluation of patient's condition.  Medications Ordered in ED Medications  ipratropium-albuterol (DUONEB) 0.5-2.5 (3) MG/3ML nebulizer solution 3 mL (not administered)     Initial Impression / Assessment and Plan / ED Course  I have reviewed the triage vital signs and the nursing notes.  Pertinent labs & imaging results that were available during my care of the patient were reviewed by me and considered in my medical decision making (see chart for details).     Patient seen and examined, patient with worsening shortness of breath over the last few weeks. Patient is coughing during examination. She is tachypneic, with rhonchi and rales in both lung fields. She received 10 mg of albuterol, 0.5 mg of Atrovent, 125 mg of Solu-Medrol by EMS. She states she is feeling better already. She still hypoxic, 88% on 3 L. Patient is normally 2 L at home. We'll give another treatment, get labs and chest x-ray, will monitor.  Sepsis - Repeat Assessment  Performed at:    8:33 PM  Vitals     Blood pressure (!) 87/49, pulse 74, temperature 98.2 F (36.8 C), temperature source Oral, resp. Rate 19, SpO2 95 %.  Heart:     Regular rate and rhythm  Lungs:    Rales  Capillary Refill:   <2 sec  Peripheral Pulse:   Radial pulse palpable  Skin:     Normal Color   patient's blood pressure continues to be marginal, between 19J and 47W systolic. Spoke with critical care, who advised me to call and talk to medicine for admission. There was a question about patient's CODE STATUS, I have confirmed with patient that she is full code at this time. I will call medicine for admission.   I spoke with trial, who will come by and see patient. I reassessed  the patient again, currently her blood pressure is 295 systolic. Patient is mentating well. She states she feels better. Discussed with Dr. Ellender Hose, will try BiPAP to reduce oxygenation.    Final Clinical Impressions(s) / ED Diagnoses   Final diagnoses:  Sepsis, due to unspecified organism [facility])    New Prescriptions New Prescriptions   No medications on file     Jeannett Senior, Hershal Coria 11/28/16 2330    Duffy Bruce, MD 11/29/16 1135

## 2016-11-28 NOTE — H&P (Addendum)
History and Physical    [name] HAL:[id number] DOB: [birth year] DOA: 11/28/2016  Referring MD/NP/PA:   PCP: Hoyt Koch, MD   Patient coming from:  The patient is coming from SNF  At baseline, pt is dependent for most of ADL.   Chief Complaint: cough and SOB, and diarrhea  HPI: [name] is a 83 y.o. female with medical history significant of chronic respiratory failure on 2 L oxygen, COPD, asthma, hyperlipidemia, stroke, GERD, former smoker, [name], s/p of placement, [name], [name], [name], [name], [name], who presents with cough, shortness breath, diarrhea.  Patient states that she has been having cough for almost 2 weeks, which has worsened today. Patient was noted to have respiratory distress in facility. She coughs up brownish colored sputum. She was found to have hypoxic with oxygen saturation in the low 80s upon arrival. Patient is on 2 L of oxygen at all times and needs more oxygen requirement. Patient denies chest pain, fever or chills. She states that she has diarrhea since last night. She had 3 loose stool bowel movements. No nausea, vomiting, abdominal pain. She is taking Amitiza for constipation. Denies symptoms of UTI or unilateral weakness. Patient was initially hypotensive with a blood pressure 84/65, which improved to 91/43 after 2.75 L normal saline bolus in ED.  ED Course: pt was found to have WBC 23.5, lactic acid 2.53, negative troponin, BNP 156.7, ABG with pH of 7.376, CO2 29 and PO2 110, pending urinalysis, worsening renal function, temperature normal, no tachycardia, has tachypnea. Chest x-ray for possible right basal infiltration. Patient is admitted to stepdown as inpatient. BiPAP was started.  Review of Systems:   General: no fevers, chills, no body weight gain, has poor appetite, has fatigue HEENT: no blurry vision, hearing changes or sore throat Respiratory: has dyspnea, coughing, no wheezing CV: no chest pain,  no palpitations GI: no nausea, vomiting, abdominal pain, has diarrhea, no constipation GU: no dysuria, burning on urination, increased urinary frequency, hematuria  Ext: has trace leg edema Neuro: no unilateral weakness, numbness, or tingling, no vision change or hearing loss Skin: no rash, no skin tear. MSK: No muscle spasm, no deformity, no limitation of range of movement in spin Heme: No easy bruising.  Travel history: No recent long distant travel.  Allergy:  Allergies  Allergen Reactions  . Cholestatin Other (See Comments)    RAGWEED SEASON...sneezing   . Sulfur Itching    Past Medical History:  Diagnosis Date  . Acute blood loss anemia 04/20/2012  . AF (atrial fibrillation) ([place])     AV ablation 9/09 [place] per Dr Ola Spurr - AV node ablation 9/11 Dr Caryl Comes  . Amiodarone pulmonary toxicity   . Asthma   . Bronchitis    hx of   . [name] (coronary artery disease)    (not sure of this 11/10  . CHF (congestive heart failure) ([place])   . Chronic renal insufficiency, stage III (moderate)    CrCl about 60 ml/min  . CKD (chronic kidney disease) stage 3, GFR 30-59 ml/min   . Complication of anesthesia    "psycotic episode" after hip surg - resolved  . COPD (chronic obstructive pulmonary disease) (HCC)    emphysema -FeV1 73% DLCO 53% 5/09  . CVA (cerebral vascular accident) ([place])    no residual effects evident to pt   . Depression 06/06/2012  . Diabetes ([place])   . Diastolic heart failure    Acute on Chronic  . Eczema   .  GERD (gastroesophageal reflux disease)   . History of skin cancer   . Hx of cardiovascular stress test    Lexiscan Myoview (09/2013):  No definite ischemia, EF 67%; low risk  . Hyperlipidemia   . Invasive ductal carcinoma of breast ([place]) 2011   LEFT   . Mental disorder   . OA (osteoarthritis) of knee    RIGHT  . Pacemaker    Permanent  . PAD (peripheral artery disease) (HCC)    w/hx right iliac/SFA stenting and left and rt leg PTA  . Pain    pt states  has pain in fingertips per right hand pt states has been told may be carpal tunnel  . Pneumonia    hx of   . Right sided sciatica   . Shortness of breath dyspnea    walking distances   . Sinoatrial node dysfunction (HCC)   . Sleep apnea    associated with hypersomnia uses O2 2L/M at night and during naps also uses CPAP  . Tobacco abuse   . Tremors of nervous system    hands bilat     Past Surgical History:  Procedure Laterality Date  . ATRIAL ABLATION SURGERY     x 2 - "did not help - had to have pacemaker"  . BREAST LUMPECTOMY     left breast  . CARDIAC CATHETERIZATION    . CATARACT EXTRACTION, BILATERAL     with IOL/Dr Katy Fitch  . EP IMPLANTABLE DEVICE N/A 02/13/2015   Procedure: PPM Generator Changeout-St. Jude device;  Surgeon: Deboraha Sprang, MD;  Location: [facility];  Service: Cardiovascular;  Laterality: N/A;  . MASTECTOMY, PARTIAL  02/03/2010   Left/Dr Rosenbower  . ORIF ACETABULAR FRACTURE Left 04/19/2012   Procedure: OPEN REDUCTION INTERNAL FIXATION (ORIF) ACETABULAR FRACTURE;  Surgeon: Rozanna Box, MD;  Location: [facility];  Service: Orthopedics;  Laterality: Left;  . sun spot removed      forhead  . TOTAL HIP ARTHROPLASTY Left 10/31/2014   Procedure: LEFT TOTAL HIP ARTHROPLASTY POSTERIOR  APPROACH;  Surgeon: Gaynelle Arabian, MD;  Location: [facility];  Service: Orthopedics;  Laterality: Left;  . TOTAL KNEE ARTHROPLASTY Right 10/16/2013   Procedure: RIGHT TOTAL KNEE ARTHROPLASTY;  Surgeon: Gearlean Alf, MD;  Location: [facility];  Service: Orthopedics;  Laterality: Right;  Marland Kitchen VASCULAR SURGERY     both legs     Social History:  reports that she quit smoking about 4 years ago. Her smoking use included Cigarettes. She has a 55.00 pack-year smoking history. She has never used smokeless tobacco. She reports that she drinks alcohol. She reports that she does not use drugs.  Family History:  Family History  Problem Relation Age of Onset  . Heart disease Father      Prior  to Admission medications   Medication Sig Start Date End Date Taking? Authorizing Provider  acetaminophen (TYLENOL) 650 MG CR tablet Take 1,300 mg by mouth every 8 (eight) hours as needed for pain.    [provider]  ADVAIR DISKUS 250-50 MCG/DOSE AEPB INHALE 1 PUFF TWICE DAILY  (30  DAY  EXPIRATION) 08/12/16   Marshell Garfinkel, MD  AMITIZA 24 MCG capsule TAKE 1 CAPSULE TWICE DAILY WITH A MEAL 03/10/16   Hoyt Koch, MD  atorvastatin (LIPITOR) 20 MG tablet TAKE 1 TABLET EVERY DAY 03/10/16   Deboraha Sprang, MD  B-D ULTRA-FINE 33 LANCETS MISC Use to help check blood sugars daily Dx E11.9 07/30/15   Hoyt Koch,  MD  benzonatate (TESSALON) 100 MG capsule Take 1 capsule (100 mg total) by mouth 3 (three) times daily as needed for cough. 09/21/16   Nche, Charlene Brooke, NP  Biotin (BIOTIN 5000) 5 MG CAPS Take 5 mg by mouth at bedtime.     [provider]  cetirizine (ZYRTEC) 5 MG tablet TAKE ONE TABLET DAILY 11/05/16   Mannam, Praveen, MD  ELIQUIS 5 MG TABS tablet TAKE 1 TABLET TWICE DAILY  (MUST  ESTABLISH  NEW  PRIMARY CARE PROVIDER  FOR FUTURE REFILLS) 11/14/15   Hoyt Koch, MD  esomeprazole (NEXIUM) 20 MG capsule TAKE ONE CAPSULE EACH DAY AT 12 NOON 11/05/16   Mannam, Praveen, MD  fluticasone (FLONASE) 50 MCG/ACT nasal spray Place 2 sprays into both nostrils daily. 09/22/16   Marshell Garfinkel, MD  furosemide (LASIX) 40 MG tablet TAKE ONE TABLET BY MOUTH ONCE DAILY 07/16/16   Hoyt Koch, MD  glucose blood (BAYER CONTOUR TEST) test strip 1 each by Other route daily. Use to check blood sugars every day Dx E11.9 07/18/15   Hoyt Koch, MD  levalbuterol [facility] HFA) 45 MCG/ACT inhaler Inhale 2 puffs into the lungs every 6 (six) hours as needed. Reported on 09/23/2015 09/24/16   Marshell Garfinkel, MD  levofloxacin (LEVAQUIN) 500 MG tablet Take 1 tablet (500 mg total) by mouth daily. 11/28/16   McGowen, Adrian Blackwater, MD  losartan (COZAAR) 25 MG tablet Take 0.5  tablets (12.5 mg total) by mouth daily. 05/04/16   Florencia Reasons, MD  metFORMIN (GLUCOPHAGE) 500 MG tablet TAKE 1 TABLET TWICE DAILY WITH A MEAL 10/07/16   Golden Circle, FNP  montelukast (SINGULAIR) 10 MG tablet TAKE ONE TABLET AT BEDTIME 11/13/16   Hoyt Koch, MD  MUCINEX 600 MG 12 hr tablet TAKE ONE TABLET TWICE DAILY 09/21/16   Nche, Charlene Brooke, NP  Multiple Vitamins-Minerals (MULTIVITAMIN WITH MINERALS) tablet Take 1 tablet by mouth 2 (two) times daily.     [provider]  Potassium Chloride ER 20 MEQ TBCR TAKE 2 TABLETS IN THE MORNING  AND TAKE 2 TABLETS IN THE EVENING 09/02/16   Deboraha Sprang, MD  predniSONE (DELTASONE) 20 MG tablet 2 tabs po qd x 5d, then 1 tab po qd x 5d, then 1/2 tab po qd x 6d 11/28/16   McGowen, Adrian Blackwater, MD  SPIRIVA HANDIHALER 18 MCG inhalation capsule INHALE THE CONTENTS OF 1 CAPSULE EVERY DAY 08/12/16   Marshell Garfinkel, MD    Physical Exam: Vitals:   11/28/16 2340 11/29/16 0000 11/29/16 0020 11/29/16 0040  BP: 96/78 (!) 100/53 (!) 105/51 (!) 108/53  Pulse: 72 78 78 78  Resp: (!) 21 (!) 21 (!) 24 (!) 22  Temp:      TempSrc:      SpO2: 95% 97% 96% 97%   General: Not in acute distress HEENT:       Eyes: PERRL, EOMI, no scleral icterus.       ENT: No discharge from the ears and nose, no pharynx injection, no tonsillar enlargement.        Neck: No JVD, no bruit, no mass felt. Heme: No neck lymph node enlargement. Cardiac: S1/S2, Irregularly irregular rhythm, No murmurs, No gallops or rubs. Respiratory: Has diffused rhonchi bilaterally.  GI: Soft, nondistended, nontender, no rebound pain, no organomegaly, BS present. GU: No hematuria Ext: has trace leg edema bilaterally. 2+DP/PT pulse bilaterally. Musculoskeletal: No joint deformities, No joint redness or warmth, no limitation of ROM in spin. Skin:  No rashes.  Neuro: Alert, oriented X3, cranial nerves II-XII grossly intact, moves all extremities normally. Psych: Patient is not psychotic, no  suicidal or hemocidal ideation.  Labs on Admission: I have personally reviewed following labs and imaging studies  CBC:  Recent Labs Lab 11/28/16 1803  WBC 23.5*  NEUTROABS 20.6*  HGB 11.7*  HCT 35.4*  MCV 84.7  PLT 035   Basic Metabolic Panel:  Recent Labs Lab 11/28/16 1803  NA 134*  K 4.2  CL 102  CO2 21*  GLUCOSE 204*  BUN 20  CREATININE 1.53*  CALCIUM 8.8*   GFR: Estimated Creatinine Clearance: 25.6 mL/min (A) (by C-G formula based on SCr of 1.53 mg/dL (H)). Liver Function Tests:  Recent Labs Lab 11/28/16 1803  AST 16  ALT 9*  ALKPHOS 76  BILITOT 1.1  PROT 7.9  ALBUMIN 2.7*   No results for input(s): LIPASE, AMYLASE in the last 168 hours. No results for input(s): AMMONIA in the last 168 hours. Coagulation Profile:  Recent Labs Lab 11/28/16 2217  INR 2.45   Cardiac Enzymes: No results for input(s): CKTOTAL, CKMB, CKMBINDEX, TROPONINI in the last 168 hours. BNP (last 3 results) No results for input(s): PROBNP in the last 8760 hours. HbA1C: No results for input(s): HGBA1C in the last 72 hours. CBG: No results for input(s): GLUCAP in the last 168 hours. Lipid Profile: No results for input(s): CHOL, HDL, LDLCALC, TRIG, CHOLHDL, LDLDIRECT in the last 72 hours. Thyroid Function Tests: No results for input(s): TSH, T4TOTAL, FREET4, T3FREE, THYROIDAB in the last 72 hours. Anemia Panel: No results for input(s): VITAMINB12, FOLATE, FERRITIN, TIBC, IRON, RETICCTPCT in the last 72 hours. Urine analysis:    Component Value Date/Time   COLORURINE YELLOW 04/25/2016 1216   APPEARANCEUR CLEAR 04/25/2016 1216   LABSPEC 1.019 04/25/2016 1216   PHURINE 5.0 04/25/2016 1216   GLUCOSEU NEGATIVE 04/25/2016 1216   HGBUR NEGATIVE 04/25/2016 1216   BILIRUBINUR NEGATIVE 04/25/2016 1216   KETONESUR NEGATIVE 04/25/2016 1216   PROTEINUR NEGATIVE 04/25/2016 1216   UROBILINOGEN 0.2 10/24/2014 1433   NITRITE NEGATIVE 04/25/2016 1216   LEUKOCYTESUR NEGATIVE 04/25/2016  1216   Sepsis Labs: @LABRCNTIP (procalcitonin:4,lacticidven:4) )No results found for this or any previous visit (from the past 240 hour(s)).   Radiological Exams on Admission: Dg Chest Portable 1 View  Result Date: 11/28/2016 CLINICAL DATA:  Hypoxia. EXAM: PORTABLE CHEST 1 VIEW COMPARISON:  08/11/2016 and 04/25/2016 FINDINGS: Left-sided pacemaker unchanged. Lungs are adequately inflated demonstrate mild hazy opacification over the right base slightly worse. Mild stable chronic interstitial prominence bilaterally. Cardiomediastinal silhouette is within normal. Mild degenerate change of the spine and shoulders. IMPRESSION: Mild opacification over the right base which may be due to atelectasis or infection. Chronic stable interstitial prominence. Electronically Signed   By: Marin Olp M.D.   On: 11/28/2016 18:20     EKG: Independently reviewed. Atrial fibrillation, QTC 498, left axis deviation, widening QRS.   Assessment/Plan Principal Problem:   Acute on chronic respiratory failure with hypoxia (HCC) Active Problems:   Controlled diabetes mellitus type 2 with complications (HCC)   Hyperlipidemia   [name] (coronary artery disease)   Atrial fibrillation (HCC)   PACEMAKER, PERMANENT   Diastolic CHF, chronic (HCC)   GERD (gastroesophageal reflux disease)   COPD with acute exacerbation (HCC)   Sepsis ([place])   HCAP (healthcare-associated pneumonia)   Acute renal failure superimposed on stage 3 chronic kidney disease ([place])   Diarrhea   Septic shock ([place])   Sepsis due to acute  on chronic respiratory failure with hypoxia due to combination of COPD exacerbation and possible HCAP: Patient meets criteria for sepsis with hypotension, leukocytosis, tachycardia. Lactic acid is elevated. Blood pressure responded to IV fluid resuscitation, improved to 91/43. Currently hemodynamically stable. PCCM was consulted by EDP, recommended to stepdown admission.  - will admit to SDU as inpt - BiPAP - IV  Vancomycin, Zosyn, azithromycin (pt received one dose of Rocephin in ED) - Mucinex for cough  - prn Albuterol Nebs, DuoNeb prn for SOB - continue Singulair - Urine legionella and S. pneumococcal antigen - Follow up blood culture x2, sputum culture and respiratory virus panel, - will get Procalcitonin and trend lactic acid level per sepsis protocol - IVF: 2.75 L of NS bolus in ED, followed by 75 mL per hour of NS - get SLP  Addendum: pt's Bp initially responded to IV fluid resuscitation, improved from 84/75 to 91/43, but then blood pressure drops again. After 3.5 L normal saline bolus, blood pressure is still low, SBP at 80s. Currently patient's mental status normal. -consulted PCCM, Dr. Oletta Darter recommended to start IV phenylephrine titration  Controlled diabetes mellitus type 2 with renal complications: Last K5G 7.0 on 04/26/16, fairly controled. Patient is taking metformin at home -SSI  HDL: lipiotor  Atrial Fibrillation: CHA2DS2-VASc Score is 7, needs oral anticoagulation. Patient is on Eliquis at home. INR is  on admission. Heart rate is well controlled without using CCB or BB -tele mornitoring  [name]: no Cp. -continue lipitor  GERD: -Protonix  Diastolic CHF, chronic ([place]): 2-D echo on 08/08/16 showed EF 55%. Patient has trace leg edema, no JVD. BNP 156.7 CHF seems to be compensated. -Hold Lasix due to hypotension and sepsis.  HTN: -hold Cozaar and Lasix due to hypotension  Diarrhea; pt states that 3 loose stool bowel movements since yesterday. No nausea, vomiting or abdominal pain. Pt is on Amitiza. -will hold Amitiza. If continue to have diarrhea, will check C diff prn  AoCKD-III: Baseline Cre is 0.8-1.0, pt's Cre is  1.53 on admission. Likely due to prerenal secondary to dehydration and continuation o ARB, diuretics. ATN is also possibly due to hypotension. - IVF as above - Check FeUrea - Follow up renal function by BMP - Hold lasix and [place]   DVT ppx: on Eliquis Code  Status: Full code (pt was on DNR before, but today, I discussed with patient and explained the meaning of [place]. Patient wants to be full code) Family Communication: None at bed side.   Disposition Plan:  Anticipate discharge back to previous SNF Consults called:  PCCM Admission status:  SDU/inpation       Date of Service 11/29/2016    Ivor Costa [facility] Pager [phone]  If 7PM-7AM, please contact night-coverage [email or web address] Password [email or web address] 11/29/2016, 1:14 AM

## 2016-11-28 NOTE — Progress Notes (Addendum)
Pharmacy Antibiotic Note  [name] is a 83 y.o. female admitted on 11/28/2016 with SOB and suspected pneumonia.  Pharmacy has been consulted for vancomycin and zosyn dosing.  Patient is afebrile, WBC elevated at 23.5, and LA elevated at 3.25. SCr 1.53 (BL ~ 0.9-1) for estimated CrCl ~ 25-30 mL/min.   Received CTX 1g IV x1 and Azithromycin 500mg  IV x1 in the ED.   Plan:  Azithromycin 500 mg IV q24 hr per MD Vancomycin 1g IV x1, then 750mg  IV q24hr  Zosyn 3.375g IV q8hr Vancomycin trough at steady state and as needed (goal 15-20 mcg/mL) Monitor renal function, clinical picture, and culture data F/u length of therapy and de-escalation   Temp (24hrs), Avg:98.3 F (36.8 C), Min:98.2 F (36.8 C), Max:98.4 F (36.9 C)   Recent Labs Lab 11/28/16 1803 11/28/16 1833 11/28/16 2132  WBC 23.5*  --   --   CREATININE 1.53*  --   --   LATICACIDVEN  --  2.53* 3.25*    Estimated Creatinine Clearance: 25.6 mL/min (A) (by C-G formula based on SCr of 1.53 mg/dL (H)).    Allergies  Allergen Reactions  . Cholestatin Other (See Comments)    RAGWEED SEASON...sneezing   . Sulfur Itching    Antimicrobials this admission: 9/22 CTX x1  9/22 Azith >> 9/22 Vanc >>  9/22 Zosyn >>   Microbiology results: pending   Argie Ramming, PharmD Clinical Pharmacist 11/28/16 10:27 PM

## 2016-11-28 NOTE — ED Notes (Signed)
Pt placed on NRB after sats dropped to 84% on [place].  EDPA notified of this and pt's hypotension.

## 2016-11-28 NOTE — ED Triage Notes (Signed)
Pt here from nursing home with c/o sob , pt sats 85 with ems , sats remain 85% on 3 liters on arrival to ED , pt received 125 mg solumedrol and albuterol and Atrovent , pt is on 2 liters atc

## 2016-11-28 NOTE — ED Notes (Signed)
Due to patient's unstable blood pressure at this time re evaluation by Dr. Blaine Hamper is requested by the floor.  Dr. Blaine Hamper at bedside.  PCCM to be consulted.

## 2016-11-29 ENCOUNTER — Inpatient Hospital Stay (HOSPITAL_COMMUNITY): Payer: Medicare Other

## 2016-11-29 DIAGNOSIS — A419 Sepsis, unspecified organism: Secondary | ICD-10-CM | POA: Diagnosis present

## 2016-11-29 DIAGNOSIS — R6521 Severe sepsis with septic shock: Secondary | ICD-10-CM

## 2016-11-29 LAB — URINALYSIS, ROUTINE W REFLEX MICROSCOPIC
Bilirubin Urine: NEGATIVE
Glucose, UA: 50 mg/dL — AB
KETONES UR: NEGATIVE mg/dL
Leukocytes, UA: NEGATIVE
Nitrite: NEGATIVE
PH: 5 (ref 5.0–8.0)
PROTEIN: NEGATIVE mg/dL
SQUAMOUS EPITHELIAL / LPF: NONE SEEN
Specific Gravity, Urine: 1.01 (ref 1.005–1.030)

## 2016-11-29 LAB — RESPIRATORY PANEL BY PCR
Adenovirus: NOT DETECTED
BORDETELLA PERTUSSIS-RVPCR: NOT DETECTED
CHLAMYDOPHILA PNEUMONIAE-RVPPCR: NOT DETECTED
CORONAVIRUS NL63-RVPPCR: NOT DETECTED
Coronavirus 229E: NOT DETECTED
Coronavirus HKU1: NOT DETECTED
Coronavirus OC43: NOT DETECTED
INFLUENZA A-RVPPCR: NOT DETECTED
Influenza B: NOT DETECTED
Metapneumovirus: NOT DETECTED
Mycoplasma pneumoniae: NOT DETECTED
PARAINFLUENZA VIRUS 3-RVPPCR: NOT DETECTED
PARAINFLUENZA VIRUS 4-RVPPCR: NOT DETECTED
Parainfluenza Virus 1: NOT DETECTED
Parainfluenza Virus 2: NOT DETECTED
RHINOVIRUS / ENTEROVIRUS - RVPPCR: NOT DETECTED
Respiratory Syncytial Virus: NOT DETECTED

## 2016-11-29 LAB — GLUCOSE, CAPILLARY
GLUCOSE-CAPILLARY: 278 mg/dL — AB (ref 65–99)
GLUCOSE-CAPILLARY: 138 mg/dL — AB (ref 65–99)
GLUCOSE-CAPILLARY: 178 mg/dL — AB (ref 65–99)
Glucose-Capillary: 321 mg/dL — ABNORMAL HIGH (ref 65–99)
Glucose-Capillary: 333 mg/dL — ABNORMAL HIGH (ref 65–99)
Glucose-Capillary: 223 mg/dL — ABNORMAL HIGH (ref 65–99)
Glucose-Capillary: 162 mg/dL — ABNORMAL HIGH (ref 65–99)
Glucose-Capillary: 121 mg/dL — ABNORMAL HIGH (ref 65–99)
Glucose-Capillary: 139 mg/dL — ABNORMAL HIGH (ref 65–99)
Glucose-Capillary: 171 mg/dL — ABNORMAL HIGH (ref 65–99)

## 2016-11-29 LAB — BLOOD GAS, ARTERIAL
ACID-BASE DEFICIT: 9.2 mmol/L — AB (ref 0.0–2.0)
BICARBONATE: 15.5 mmol/L — AB (ref 20.0–28.0)
Drawn by: 51133
O2 Saturation: 96.9 %
PCO2 ART: 30.2 mmHg — AB (ref 32.0–48.0)
PH ART: 7.331 — AB (ref 7.350–7.450)
PO2 ART: 103 mmHg (ref 83.0–108.0)
Patient temperature: 98.6

## 2016-11-29 LAB — VANCOMYCIN, RANDOM: VANCOMYCIN RM: 14

## 2016-11-29 LAB — LACTIC ACID, PLASMA: Lactic Acid, Venous: 2.8 mmol/L (ref 0.5–1.9)

## 2016-11-29 LAB — MRSA PCR SCREENING: MRSA by PCR: NEGATIVE

## 2016-11-29 LAB — CREATININE, URINE, RANDOM: Creatinine, Urine: 59.28 mg/dL

## 2016-11-29 LAB — CBG MONITORING, ED: Glucose-Capillary: 345 mg/dL — ABNORMAL HIGH (ref 65–99)

## 2016-11-29 LAB — STREP PNEUMONIAE URINARY ANTIGEN: STREP PNEUMO URINARY ANTIGEN: NEGATIVE

## 2016-11-29 LAB — CORTISOL: Cortisol, Plasma: 10.4 ug/dL

## 2016-11-29 MED ORDER — SODIUM CHLORIDE 0.9 % IV SOLN: 250.0000 mL | INTRAVENOUS | Status: DC | PRN

## 2016-11-29 MED ORDER — SODIUM CHLORIDE 0.9 % IV SOLN
INTRAVENOUS | Status: DC
  Administered 2016-11-29: 2.7 [IU]/h via INTRAVENOUS
  Filled 2016-11-29: qty 1

## 2016-11-29 MED ORDER — HYDROCORTISONE NA SUCCINATE PF 100 MG IJ SOLR
50.0000 mg | Freq: Four times a day (QID) | INTRAMUSCULAR | Status: AC
  Administered 2016-11-29 (×3): 50 mg via INTRAVENOUS
  Filled 2016-11-29 (×2): qty 1
  Filled 2016-11-29: qty 2

## 2016-11-29 MED ORDER — INSULIN ASPART 100 UNIT/ML ~~LOC~~ SOLN
0.0000 [IU] | SUBCUTANEOUS | Status: DC
  Administered 2016-11-29 (×2): 3 [IU] via SUBCUTANEOUS
  Administered 2016-11-30: 2 [IU] via SUBCUTANEOUS
  Administered 2016-11-30: 3 [IU] via SUBCUTANEOUS
  Administered 2016-11-30: 2 [IU] via SUBCUTANEOUS
  Administered 2016-12-01: 3 [IU] via SUBCUTANEOUS
  Administered 2016-12-01: 2 [IU] via SUBCUTANEOUS
  Administered 2016-12-01 – 2016-12-02 (×4): 3 [IU] via SUBCUTANEOUS
  Administered 2016-12-02: 2 [IU] via SUBCUTANEOUS
  Administered 2016-12-03: 3 [IU] via SUBCUTANEOUS
  Administered 2016-12-03 (×3): 2 [IU] via SUBCUTANEOUS
  Administered 2016-12-03: 3 [IU] via SUBCUTANEOUS
  Administered 2016-12-04: 5 [IU] via SUBCUTANEOUS
  Administered 2016-12-04: 2 [IU] via SUBCUTANEOUS
  Administered 2016-12-04 (×3): 3 [IU] via SUBCUTANEOUS
  Administered 2016-12-05 (×2): 2 [IU] via SUBCUTANEOUS
  Administered 2016-12-05: 3 [IU] via SUBCUTANEOUS
  Administered 2016-12-05: 8 [IU] via SUBCUTANEOUS

## 2016-11-29 MED ORDER — INSULIN ASPART 100 UNIT/ML ~~LOC~~ SOLN: 2.0000 [IU] | SUBCUTANEOUS | Status: DC

## 2016-11-29 NOTE — ED Notes (Signed)
Pt eating turkey sandwich

## 2016-11-29 NOTE — Progress Notes (Signed)
Modified Barium Swallow Progress Note  Patient Details  Name: [name] MRN: [id number] Date of Birth: 22-Dec-1931  Today's Date: 11/29/2016  Modified Barium Swallow completed.  Full report located under Chart Review in the Imaging Section.  Brief recommendations include the following:  Clinical Impression  Patient presents with swallowing function appearing similiar to what was observed on 04/27/16 MBS. No frank aspiration observed on this exam. Due to decreased laryngeal closure and intermittently delayed swallow initiation to pyriform sinuses, pt consistently penetrated thin liquids with larger boluses and straw sips. Shallow penetration was transient or reversible with subsequent swallow, although with deeper penetration to the level of the vocal cords with consecutive sips pt did not sense trace amount of penetration, which she ultimately cleared with a cued throat clear. Recommend regular diet with thin liquids, meds whole with liquid. Pt may benefit from clearing throat intermittently during meals. Avoid straws, talking during mealtimes or eating when experiencing shortness of breath. Pt's respiratory status, COPD puts her at mild risk for aspiration. SLP will follow briefly for diet tolerance, education, training in compensatory throat clear.    Swallow Evaluation Recommendations       SLP Diet Recommendations: Regular solids;Thin liquid   Liquid Administration via: Cup   Medication Administration: Whole meds with liquid   Supervision: Patient able to self feed   Compensations: Slow rate;Small sips/bites       Oral Care Recommendations: Oral care BID     Deneise Lever, [place], CCC-SLP Speech-Language Pathologist [phone]   [name] 11/29/2016,5:10 PM

## 2016-11-29 NOTE — Progress Notes (Signed)
  Additional management  [name] is well known to me She is in NO distress and remains on neo 10 mic pcxr is c/w asp PNA and Ct I reviewed Sepsis is resolving She tolerated fluids well Her history supports silent aspiration with recurrent PNA SLP on going, will need OBJECTIVE testing Rt base infiltrate, NOT c/w atypical, dc azithro I called and updated her [place] family with pt in room  Lavon Paganini. Titus Mould, MD, [place] Pgr: [facility]

## 2016-11-29 NOTE — Evaluation (Signed)
Clinical/Bedside Swallow Evaluation Patient Details  Name: [name] MRN: [id number] Date of Birth: 03/02/1932  Today's Date: 11/29/2016 Time: SLP Start Time (ACUTE ONLY): 0802 SLP Stop Time (ACUTE ONLY): 0825 SLP Time Calculation (min) (ACUTE ONLY): 23 min  Past Medical History:  Past Medical History:  Diagnosis Date  . Acute blood loss anemia 04/20/2012  . AF (atrial fibrillation) ([place])     AV ablation 9/09 [place] per Dr Ola Spurr - AV node ablation 9/11 Dr Caryl Comes  . Amiodarone pulmonary toxicity   . Asthma   . Bronchitis    hx of   . [name] (coronary artery disease)    (not sure of this 11/10  . CHF (congestive heart failure) ([place])   . Chronic renal insufficiency, stage III (moderate)    CrCl about 60 ml/min  . CKD (chronic kidney disease) stage 3, GFR 30-59 ml/min   . Complication of anesthesia    "psycotic episode" after hip surg - resolved  . COPD (chronic obstructive pulmonary disease) (HCC)    emphysema -FeV1 73% DLCO 53% 5/09  . CVA (cerebral vascular accident) ([place])    no residual effects evident to pt   . Depression 06/06/2012  . Diabetes ([place])   . Diastolic heart failure    Acute on Chronic  . Eczema   . GERD (gastroesophageal reflux disease)   . History of skin cancer   . Hx of cardiovascular stress test    Lexiscan Myoview (09/2013):  No definite ischemia, EF 67%; low risk  . Hyperlipidemia   . Invasive ductal carcinoma of breast ([place]) 2011   LEFT   . Mental disorder   . OA (osteoarthritis) of knee    RIGHT  . Pacemaker    Permanent  . PAD (peripheral artery disease) (HCC)    w/hx right iliac/SFA stenting and left and rt leg PTA  . Pain    pt states has pain in fingertips per right hand pt states has been told may be carpal tunnel  . Pneumonia    hx of   . Right sided sciatica   . Shortness of breath dyspnea    walking distances   . Sinoatrial node dysfunction (HCC)   . Sleep apnea    associated with hypersomnia uses O2 2L/M at night and during  naps also uses CPAP  . Tobacco abuse   . Tremors of nervous system    hands bilat    Past Surgical History:  Past Surgical History:  Procedure Laterality Date  . ATRIAL ABLATION SURGERY     x 2 - "did not help - had to have pacemaker"  . BREAST LUMPECTOMY     left breast  . CARDIAC CATHETERIZATION    . CATARACT EXTRACTION, BILATERAL     with IOL/Dr Katy Fitch  . EP IMPLANTABLE DEVICE N/A 02/13/2015   Procedure: PPM Generator Changeout-St. Jude device;  Surgeon: Deboraha Sprang, MD;  Location: [facility];  Service: Cardiovascular;  Laterality: N/A;  . MASTECTOMY, PARTIAL  02/03/2010   Left/Dr Rosenbower  . ORIF ACETABULAR FRACTURE Left 04/19/2012   Procedure: OPEN REDUCTION INTERNAL FIXATION (ORIF) ACETABULAR FRACTURE;  Surgeon: Rozanna Box, MD;  Location: [facility];  Service: Orthopedics;  Laterality: Left;  . sun spot removed      forhead  . TOTAL HIP ARTHROPLASTY Left 10/31/2014   Procedure: LEFT TOTAL HIP ARTHROPLASTY POSTERIOR  APPROACH;  Surgeon: Gaynelle Arabian, MD;  Location: [facility];  Service: Orthopedics;  Laterality: Left;  . TOTAL KNEE ARTHROPLASTY  Right 10/16/2013   Procedure: RIGHT TOTAL KNEE ARTHROPLASTY;  Surgeon: Gearlean Alf, MD;  Location: [facility];  Service: Orthopedics;  Laterality: Right;  Marland Kitchen VASCULAR SURGERY     both legs    HPI:  Tiffany T Turneris a 83 y.o.femalewith medical history significant of chronic respiratory failure on 2 L oxygen, COPD, asthma, hyperlipidemia, stroke, GERD, formersmoker, [name], s/p ofplacement, [name], [name], [name], [name], [name], who presents with cough, shortness breath, diarrhea. Patient states that she has been having cough for almost 2 weeks, which has worsened today. Patient was noted to have respiratory distress in facility. She coughs up brownish colored sputum. She was found to behypoxic with oxygen saturation in the low 80s upon arrival. Patient is on 2 L of oxygen at all times and needs  more oxygen requirement. Patient denies chest pain, fever or chills. She states that she has diarrhea since last night. She had 3 loose stool bowel movements. No nausea, vomiting, abdominal pain. She is taking Amitiza forconstipation. Denies symptoms of UTI or unilateral weakness. Patient was initially hypotensive with a blood pressure 84/65, which improved to 91/43 after 2.75 L normal saline bolus in ED.  Most recent CT of the chest is showing moderate emphysema and ground glass density and moderate consolidation RLL consistent with PNA.  Patient is known to [place] with previous MBS dated 04/27/2016 which did show penetration/aspiration given large or serial sips of thin liquids.  The patient has no recollection of this study.     Assessment / Plan / Recommendation Clinical Impression  Clinical swallowing evaluation was completed using thin liquids, pureed material and dry solids in setting of respiratory failure.  Most recent chest CT is showing ground glass density and moderate consolidation in the RLL c/w PNA.  Patient is known to [place] service from previous admission with MBS dated 04/27/2016 which recommended a regular diet with thin liquids.  Patient was to take small sips of liquids as penetration/aspiration was noted given large and/or serial sips of thin.  The patient has no recollection of this study or recommendations.  Oral mechanism exam was completed and essentially unremarkable except for some mild labial droop on the right at rest that did not cause functional issues.  Given PO's the patient appeared to have a timely swallow trigger and hyo-laryngeal excursion was appreciated to palpation.  Mastication of dry solids appeared to be functional.  Patient with strong delayed cough response on thins following dry solid material.  Given patient history, current reason for admission and clinical presentation recommend MBS to determine current swallowing physiology and least restrictive diet.         Aspiration Risk  Moderate aspiration risk    Diet Recommendation  Continue current diet pending results of MBS   Medication Administration: Whole meds with puree    Other  Recommendations Oral Care Recommendations: Oral care QID   Follow up Recommendations  (TBD)             Prognosis Prognosis for Safe Diet Advancement: Good      Swallow Study   General Date of Onset: 11/28/16 HPI: Tiffany T Turneris a 83 y.o.femalewith medical history significant of chronic respiratory failure on 2 L oxygen, COPD, asthma, hyperlipidemia, stroke, GERD, formersmoker, [name], s/p ofplacement, [name], [name], [name], [name], [name], who presents with cough, shortness breath, diarrhea. Patient states that she has been having cough for almost 2 weeks, which has worsened today. Patient  was noted to have respiratory distress in facility. She coughs up brownish colored sputum. She was found to behypoxic with oxygen saturation in the low 80s upon arrival. Patient is on 2 L of oxygen at all times and needs more oxygen requirement. Patient denies chest pain, fever or chills. She states that she has diarrhea since last night. She had 3 loose stool bowel movements. No nausea, vomiting, abdominal pain. She is taking Amitiza forconstipation. Denies symptoms of UTI or unilateral weakness. Patient was initially hypotensive with a blood pressure 84/65, which improved to 91/43 after 2.75 L normal saline bolus in ED.  Most recent CT of the chest is showing moderate emphysema and ground glass density and moderate consolidation RLL consistent with PNA.  Patient is known to [place] with previous MBS dated 04/27/2016 which did show penetration/aspiration given large or serial sips of thin liquids.  The patient has no recollection of this study.   Type of Study: Bedside Swallow Evaluation Previous Swallow Assessment: 04/27/2016 MBS which rx'd Reg/thin taking small sips.   Penetration/aspiration noted given large or serial sips of thin liquids.   Diet Prior to this Study: Regular;Thin liquids Temperature Spikes Noted: No Respiratory Status: Nasal cannula History of Recent Intubation: No Behavior/Cognition: Alert;Cooperative Oral Cavity Assessment: Dry Oral Care Completed by SLP: No Oral Cavity - Dentition: Dentures, top;Dentures, bottom Vision: Functional for self-feeding Self-Feeding Abilities: Able to feed self Patient Positioning: Upright in bed Baseline Vocal Quality: Normal Volitional Cough: Strong Volitional Swallow: Able to elicit    Oral/Motor/Sensory Function Overall Oral Motor/Sensory Function: Within functional limits   Ice Chips Ice chips: Not tested   Thin Liquid Thin Liquid: Impaired Presentation: Spoon;Cup;Self Fed Pharyngeal  Phase Impairments: Cough - Delayed    Nectar Thick Nectar Thick Liquid: Not tested   Honey Thick Honey Thick Liquid: Not tested   Puree Puree: Within functional limits Presentation: Spoon   Solid   GO   Solid: Within functional limits Presentation: [place], [place], [place] SLP [phone]  Lamar Sprinkles 11/29/2016,8:35 AM

## 2016-11-29 NOTE — Progress Notes (Signed)
eLink Physician-Brief Progress Note Patient Name: [name] DOB: 16-Oct-1931 MRN: [id number]   Date of Service  11/29/2016  HPI/Events of Note  Hyperglycemia - Blood glucose = 321.   eICU Interventions  Will order ICU Phase 2 Insulin IV infusion for control.      Intervention Category Major Interventions: Hyperglycemia - active titration of insulin therapy  Sommer,Steven Cornelia Copa 11/29/2016, 4:56 AM

## 2016-11-29 NOTE — Progress Notes (Signed)
eLink Physician-Brief Progress Note Patient Name: [name] DOB: 09/05/1931 MRN: [id number]   Date of Service  11/29/2016  HPI/Events of Note  Oliguria - bladder scan with 700+ mL residual.   eICU Interventions  Will place Foley Catheter.      Intervention Category Intermediate Interventions: Oliguria - evaluation and management  Ivanka Kirshner [place] 11/29/2016, 3:02 AM

## 2016-11-29 NOTE — Progress Notes (Signed)
Patient states has not voided since 10am on 9/22: attempted multiple times in ED without success : bladder scan reveled >700cc: discussed with ELINK : Orders pending

## 2016-11-29 NOTE — Consult Note (Addendum)
..   Name: [name] MRN: [id number] DOB: 19-Nov-1931    ADMISSION DATE:  11/28/2016 CONSULTATION DATE:  11/28/2016  REFERRING MD : Pricilla Holm MD  CHIEF COMPLAINT: cough, SOB and diarrhea  BRIEF PATIENT DESCRIPTION: 83 yr old female with PMHx of COPD, on Home O2 at 2L,HLD, CVA, GERD, Former smoker, [name], Breast CA, HFpEF, [name], [name],Afib on Eliquis, who presents with cough, shortness breath, diarrhea. Elevated WBC lactate CXR small opacification at Right base. Hypotensive despite 3.5L IVF started on Phenylephrine ggt. PCCM consulted.  SIGNIFICANT EVENTS  CODE SEPSIS HYPOTENSION STARTED ON VASOPRESSOR  STUDIES:  CXR LACTATE BLOOD CX X 2   HISTORY OF PRESENT ILLNESS:  Patient states that she has been having episodes similar to this since her admission on Feb. She has a productive cough for almost 2 weeks,which has worsened significantly today. She states she has not had much to eat or drink today. Patient was noted to have respiratory distress in facility. She coughs up brownish colored sputum. She was found to have hypoxia while receiving 2L oxygen ( her home dose) saturation in the low 80s upon arrival. Patient is on 2 L of oxygen at all times and needs more oxygen requirement. Patient denies chest pain, fever or chills. She states that she has diarrhea since last night. She had 3 loose stool bowel movements. No nausea, vomiting, abdominal pain. She reports taking Amitiza for constipation. Denies symptoms of UTI  Patient was initially hypotensive with a blood pressure 84/65, which improved to 91/43 after 2.75 L normal saline bolus in ED. Giot an additional 1L IVF still hypotensive started on Phenylephrine ggt. PCCM consulted  PAST MEDICAL HISTORY :   has a past medical history of Acute blood loss anemia (04/20/2012); AF (atrial fibrillation) ([place]); Amiodarone pulmonary toxicity; Asthma; Bronchitis; [name] (coronary artery disease); CHF (congestive heart failure) ([place]); Chronic  renal insufficiency, stage III (moderate); CKD (chronic kidney disease) stage 3, GFR 30-59 ml/min; Complication of anesthesia; COPD (chronic obstructive pulmonary disease) ([place]); CVA (cerebral vascular accident) ([place]); Depression (06/06/2012); Diabetes ([place]); Diastolic heart failure; Eczema; GERD (gastroesophageal reflux disease); History of skin cancer; cardiovascular stress test; Hyperlipidemia; Invasive ductal carcinoma of breast ([place]) (2011); Mental disorder; OA (osteoarthritis) of knee; Pacemaker; PAD (peripheral artery disease) ([place]); Pain; Pneumonia; Right sided sciatica; Shortness of breath dyspnea; Sinoatrial node dysfunction ([place]); Sleep apnea; Tobacco abuse; and Tremors of nervous system.  has a past surgical history that includes Mastectomy, partial (02/03/2010); Cataract extraction, bilateral; Breast lumpectomy; ORIF acetabular fracture (Left, 04/19/2012); Atrial ablation surgery; Vascular surgery; Cardiac catheterization; Total knee arthroplasty (Right, 10/16/2013); sun spot removed ; Total hip arthroplasty (Left, 10/31/2014); and Cardiac catheterization (N/A, 02/13/2015). Prior to Admission medications   Medication Sig Start Date End Date Taking? Authorizing Provider  acetaminophen (TYLENOL) 325 MG tablet Take 650 mg by mouth every 6 (six) hours as needed for mild pain or fever.   Yes [provider]  ADVAIR DISKUS 250-50 MCG/DOSE AEPB INHALE 1 PUFF TWICE DAILY  (30  DAY  EXPIRATION) 08/12/16  Yes Mannam, Praveen, MD  AMITIZA 24 MCG capsule TAKE 1 CAPSULE TWICE DAILY WITH A MEAL 03/10/16  Yes Hoyt Koch, MD  atorvastatin (LIPITOR) 20 MG tablet TAKE 1 TABLET EVERY DAY 03/10/16  Yes Deboraha Sprang, MD  Biotin (BIOTIN 5000) 5 MG CAPS Take 5 mg by mouth at bedtime.    Yes [provider]  ELIQUIS 5 MG TABS tablet TAKE 1 TABLET TWICE DAILY  (MUST  ESTABLISH  NEW  PRIMARY  CARE PROVIDER  FOR FUTURE REFILLS) 11/14/15  Yes Hoyt Koch, MD  furosemide (LASIX) 40 MG tablet  TAKE ONE TABLET BY MOUTH ONCE DAILY 07/16/16  Yes Hoyt Koch, MD  levalbuterol [facility] HFA) 45 MCG/ACT inhaler Inhale 2 puffs into the lungs every 6 (six) hours as needed. Reported on 09/23/2015 09/24/16  Yes Mannam, Praveen, MD  levalbuterol (XOPENEX) 0.63 MG/3ML nebulizer solution Take 0.63 mg by nebulization every 4 (four) hours as needed for wheezing or shortness of breath.   Yes [provider]  metFORMIN (GLUCOPHAGE) 500 MG tablet TAKE 1 TABLET TWICE DAILY WITH A MEAL 10/07/16  Yes [place] Circle, FNP  montelukast (SINGULAIR) 10 MG tablet TAKE ONE TABLET AT BEDTIME 11/13/16  Yes Hoyt Koch, MD  MUCINEX 600 MG 12 hr tablet TAKE ONE TABLET TWICE DAILY 09/21/16  Yes Nche, Charlene Brooke, NP  Potassium Chloride ER 20 MEQ TBCR TAKE 2 TABLETS IN THE MORNING  AND TAKE 2 TABLETS IN THE EVENING Patient taking differently: TAKE 2 TABLETS IN THE EVENING 09/02/16  Yes Deboraha Sprang, MD  SPIRIVA HANDIHALER 18 MCG inhalation capsule INHALE THE CONTENTS OF 1 CAPSULE EVERY DAY 08/12/16  Yes Mannam, Praveen, MD  B-D ULTRA-FINE 33 LANCETS MISC Use to help check blood sugars daily Dx E11.9 07/30/15   Hoyt Koch, MD  glucose blood (BAYER CONTOUR TEST) test strip 1 each by Other route daily. Use to check blood sugars every day Dx E11.9 07/18/15   Hoyt Koch, MD  levofloxacin (LEVAQUIN) 500 MG tablet Take 1 tablet (500 mg total) by mouth daily. 11/28/16   McGowen, Adrian Blackwater, MD   Allergies  Allergen Reactions  . Cholestatin Other (See Comments)    RAGWEED SEASON...sneezing   . Sulfur Itching    FAMILY HISTORY:  family history includes Heart disease in her father. SOCIAL HISTORY:  reports that she quit smoking about 4 years ago. Her smoking use included Cigarettes. She has a 55.00 pack-year smoking history. She has never used smokeless tobacco. She reports that she drinks alcohol. She reports that she does not use drugs.  REVIEW OF SYSTEMS:   Constitutional:  Negative for fever, chills, weight loss, malaise/fatigue and diaphoresis.  HENT: Negative for hearing loss, ear pain, nosebleeds, congestion, sore throat, neck pain, tinnitus and ear discharge.   Eyes: Negative for blurred vision, double vision, photophobia, pain, discharge and redness.  Respiratory: Negative for cough, hemoptysis, sputum production, shortness of breath, wheezing and stridor.   Cardiovascular: Negative for chest pain, palpitations, orthopnea, claudication, leg swelling and PND.  Gastrointestinal: Negative for heartburn, nausea, vomiting, abdominal pain, diarrhea, constipation, blood in stool and melena.  Genitourinary: Negative for dysuria, urgency, frequency, hematuria and flank pain.  Musculoskeletal: Negative for myalgias, back pain, joint pain and falls.  Skin: Negative for itching and rash.  Neurological: Negative for dizziness, tingling, tremors, sensory change, speech change, focal weakness, seizures, loss of consciousness, weakness and headaches.  Endo/Heme/Allergies: Negative for environmental allergies and polydipsia. Does not bruise/bleed easily.  SUBJECTIVE:   VITAL SIGNS: Temp:  [98.2 F (36.8 C)-98.4 F (36.9 C)] 98.2 F (36.8 C) (09/22 1839) Pulse Rate:  [70-78] 78 (09/23 0000) Resp:  [16-29] 21 (09/23 0000) BP: (73-122)/(35-78) 100/53 (09/23 0000) SpO2:  [81 %-100 %] 97 % (09/23 0000) FiO2 (%):  [32 %] 32 % (09/22 1826) Weight:  [68.5 kg (151 lb)] 68.5 kg (151 lb) (09/22 1200)  PHYSICAL EXAMINATION: General:  Pleasant well nourished female Neuro: AAOx3 in NAD and no  focal deficits HEENT:  Normocephalic atraumatic dry oral mucosa Cardiovascular:  S1 and S2 Irregularly irregular rhythm Lungs: coarse breath sounds bilaterally with exp wheeze appreciated and upper airway  Abdomen: soft non tender + BS  Musculoskeletal: no deformities +1 edema in lower ext. Skin: intact   Recent Labs Lab 11/28/16 1803  NA 134*  K 4.2  CL 102  CO2 21*  BUN 20    CREATININE 1.53*  GLUCOSE 204*    Recent Labs Lab 11/28/16 1803  HGB 11.7*  HCT 35.4*  WBC 23.5*  PLT 244   Dg Chest Portable 1 View  Result Date: 11/28/2016 CLINICAL DATA:  Hypoxia. EXAM: PORTABLE CHEST 1 VIEW COMPARISON:  08/11/2016 and 04/25/2016 FINDINGS: Left-sided pacemaker unchanged. Lungs are adequately inflated demonstrate mild hazy opacification over the right base slightly worse. Mild stable chronic interstitial prominence bilaterally. Cardiomediastinal silhouette is within normal. Mild degenerate change of the spine and shoulders. IMPRESSION: Mild opacification over the right base which may be due to atelectasis or infection. Chronic stable interstitial prominence. Electronically Signed   By: Marin Olp M.D.   On: 11/28/2016 18:20    ASSESSMENT / PLAN:  NEURO: Alert and oriented With no focal deficits No acute issues   CARDIAC: HFpEF last EF 55% on ECHO H/o SA node dysfunction s/p pacer Afib  on anticoagulation with Eliquis Will continue Eliquis inpatient HR currently in 60s   Septic Shock Continue vasopressors MAP goal >85 Trend BP Trend lactate (2.53->3.25->2.6) BNP elevated at 156.7 hold diuretics .    PULMONARY: COPD on Home oxygen at 2L Acute Exacerbation of COPD Started on bronchodilators Q 6 Covered with antibiotics (Vanomycin. Azithromycin. and Zosyn) Pulmonary toilet CT Chest w/o Contrast    ID: Septic Shock Suspected sources: AECOPD (right base opacity) UA pending  Blood Cultures x 2 Respiratory viral panel Step and Legionella Urine Ag pending  Reviewed chart pt has a h/o Staph aureus and E. faecalis Enterococcus in urine was sensitive to Vancomycin (MIC 1) can continue on Vancomycin Azithromycin in setting of AECOPD Also on Zosyn  Endocrine: H/o insulin dependence On ICU Glycemic protocol BG goal 140-180mg /dl Cortiosl level ordered  GI: Diarrhea- medication induced Pt was constipated and received  Was NPO  pt awake and  alert and protecting her airway HOB 30 degrees Aspiration precautions started on Diet.  Continue PPI   Heme: Patient on anticoagulation with Eliquis Hemoglobin 11.7 Platelets 244 No signs of active bleeding If Hgb<7 transfuse PRBCs .Marland KitchenA POS DVT PPx-> SCDs and pt on eliquis  RENAL Baseline Creatinine 0.9 Acute Kidney Injury  IVF vanc random Urinalysis negative Strict Is and Os Lab Results  Component Value Date   CREATININE 1.53 (H) 11/28/2016   CREATININE 0.99 05/03/2016   CREATININE 0.92 05/02/2016  electrolyte replacement as needed   DISPOSITION: ICU CC TIME 55 mins CODE STATUS: Full     I, Dr Seward Carol have personally reviewed patient's available data, including medical history, events of note, physical examination and test results as part of my evaluation. I have discussed with resident/NP and other care providers such as pharmacist, RN and RRT. The patient is critically ill with multiple organ systems failure and requires high complexity decision making for assessment and support, frequent evaluation and titration of therapies, application of advanced monitoring technologies and extensive interpretation of multiple databases.  Critical Care Time devoted to patient care services described in this note is 57 Minutes. This time reflects time of care of this signee Dr Seward Carol. This critical  care time does not reflect procedure time, or teaching time or supervisory time but could involve care discussion time    Signed Dr Seward Carol Pulmonary Critical Care Locums Pulmonary and [place] Pager: [phone]  11/29/2016, 12:16 AM

## 2016-11-29 NOTE — ED Notes (Signed)
Critical care physician at bedside

## 2016-11-30 ENCOUNTER — Inpatient Hospital Stay (HOSPITAL_COMMUNITY): Payer: Medicare Other

## 2016-11-30 DIAGNOSIS — J9621 Acute and chronic respiratory failure with hypoxia: Secondary | ICD-10-CM

## 2016-11-30 LAB — CBC WITH DIFFERENTIAL/PLATELET
BASOS PCT: 0 %
Basophils Absolute: 0 10*3/uL (ref 0.0–0.1)
EOS ABS: 0 10*3/uL (ref 0.0–0.7)
EOS PCT: 0 %
HCT: 30.9 % — ABNORMAL LOW (ref 36.0–46.0)
Hemoglobin: 9.8 g/dL — ABNORMAL LOW (ref 12.0–15.0)
LYMPHS ABS: 1.2 10*3/uL (ref 0.7–4.0)
Lymphocytes Relative: 8 %
MCH: 27.1 pg (ref 26.0–34.0)
MCHC: 31.7 g/dL (ref 30.0–36.0)
MCV: 85.6 fL (ref 78.0–100.0)
MONOS PCT: 7 %
Monocytes Absolute: 1 10*3/uL (ref 0.1–1.0)
NEUTROS PCT: 85 %
Neutro Abs: 12.2 10*3/uL — ABNORMAL HIGH (ref 1.7–7.7)
PLATELETS: 209 10*3/uL (ref 150–400)
RBC: 3.61 MIL/uL — ABNORMAL LOW (ref 3.87–5.11)
RDW: 17.6 % — AB (ref 11.5–15.5)
WBC: 14.4 10*3/uL — ABNORMAL HIGH (ref 4.0–10.5)

## 2016-11-30 LAB — UREA NITROGEN, URINE: UREA NITROGEN UR: 375 mg/dL

## 2016-11-30 LAB — COMPREHENSIVE METABOLIC PANEL
ALK PHOS: 56 U/L (ref 38–126)
ALT: 9 U/L — AB (ref 14–54)
AST: 22 U/L (ref 15–41)
Albumin: 2.2 g/dL — ABNORMAL LOW (ref 3.5–5.0)
Anion gap: 7 (ref 5–15)
BUN: 13 mg/dL (ref 6–20)
CALCIUM: 7.7 mg/dL — AB (ref 8.9–10.3)
CHLORIDE: 113 mmol/L — AB (ref 101–111)
CO2: 20 mmol/L — AB (ref 22–32)
CREATININE: 0.99 mg/dL (ref 0.44–1.00)
GFR, EST AFRICAN AMERICAN: 59 mL/min — AB (ref >60)
GFR, EST NON AFRICAN AMERICAN: 51 mL/min — AB (ref >60)
Glucose, Bld: 148 mg/dL — ABNORMAL HIGH (ref 65–99)
Potassium: 3.2 mmol/L — ABNORMAL LOW (ref 3.5–5.1)
Sodium: 140 mmol/L (ref 135–145)
Total Bilirubin: 0.7 mg/dL (ref 0.3–1.2)
Total Protein: 6.6 g/dL (ref 6.5–8.1)

## 2016-11-30 LAB — GLUCOSE, CAPILLARY
GLUCOSE-CAPILLARY: 140 mg/dL — AB (ref 65–99)
GLUCOSE-CAPILLARY: 107 mg/dL — AB (ref 65–99)
Glucose-Capillary: 109 mg/dL — ABNORMAL HIGH (ref 65–99)
Glucose-Capillary: 127 mg/dL — ABNORMAL HIGH (ref 65–99)
Glucose-Capillary: 145 mg/dL — ABNORMAL HIGH (ref 65–99)
Glucose-Capillary: 188 mg/dL — ABNORMAL HIGH (ref 65–99)
Glucose-Capillary: 82 mg/dL (ref 65–99)
Glucose-Capillary: 94 mg/dL (ref 65–99)

## 2016-11-30 LAB — URINE CULTURE: CULTURE: NO GROWTH

## 2016-11-30 LAB — LEGIONELLA PNEUMOPHILA SEROGP 1 UR AG: L. PNEUMOPHILA SEROGP 1 UR AG: NEGATIVE

## 2016-11-30 MED ORDER — DEXTROSE 5 % IV SOLN
1.0000 g | INTRAVENOUS | Status: DC
  Administered 2016-11-30: 1 g via INTRAVENOUS
  Filled 2016-11-30: qty 10

## 2016-11-30 MED ORDER — IPRATROPIUM-ALBUTEROL 0.5-2.5 (3) MG/3ML IN SOLN
3.0000 mL | Freq: Four times a day (QID) | RESPIRATORY_TRACT | Status: DC
  Administered 2016-11-30 – 2016-12-06 (×23): 3 mL via RESPIRATORY_TRACT
  Filled 2016-11-30 (×22): qty 3

## 2016-11-30 MED ORDER — POTASSIUM CHLORIDE 10 MEQ/100ML IV SOLN
10.0000 meq | INTRAVENOUS | Status: DC
  Administered 2016-11-30: 10 meq via INTRAVENOUS
  Filled 2016-11-30 (×3): qty 100

## 2016-11-30 MED ORDER — INFLUENZA VAC SPLIT HIGH-DOSE 0.5 ML IM SUSY
0.5000 mL | PREFILLED_SYRINGE | INTRAMUSCULAR | Status: AC
  Administered 2016-12-01: 0.5 mL via INTRAMUSCULAR
  Filled 2016-11-30: qty 0.5

## 2016-11-30 MED ORDER — DEXTROMETHORPHAN POLISTIREX ER 30 MG/5ML PO SUER
30.0000 mg | Freq: Two times a day (BID) | ORAL | Status: DC
  Administered 2016-11-30 – 2016-12-05 (×7): 30 mg via ORAL
  Filled 2016-11-30 (×12): qty 5

## 2016-11-30 MED ORDER — POTASSIUM CHLORIDE CRYS ER 20 MEQ PO TBCR
30.0000 meq | EXTENDED_RELEASE_TABLET | ORAL | Status: AC
  Administered 2016-11-30 (×2): 30 meq via ORAL
  Filled 2016-11-30 (×2): qty 1

## 2016-11-30 MED ORDER — FUROSEMIDE 10 MG/ML IJ SOLN
40.0000 mg | Freq: Two times a day (BID) | INTRAMUSCULAR | Status: AC
  Administered 2016-11-30 (×2): 40 mg via INTRAVENOUS
  Filled 2016-11-30 (×2): qty 4

## 2016-11-30 MED ORDER — POTASSIUM CHLORIDE 20 MEQ/15ML (10%) PO SOLN: 40.0000 meq | Freq: Once | ORAL | Status: DC

## 2016-11-30 MED ORDER — DEXTROSE 5 % IV SOLN
1.0000 g | INTRAVENOUS | Status: DC
  Filled 2016-11-30: qty 10

## 2016-11-30 NOTE — Progress Notes (Signed)
Pt advanced to 15L HFNC, O2 sat 92%. RR 25-30. Notified ELink, received verbal orders from Dr. Nelda Marseille to d/c transfer orders.

## 2016-11-30 NOTE — Progress Notes (Signed)
[facility] ADULT ICU REPLACEMENT PROTOCOL FOR AM LAB REPLACEMENT ONLY  The patient does apply for the [facility] Adult ICU Electrolyte Replacment Protocol based on the criteria listed below:   1. Is GFR >/= 40 ml/min? Yes.    Patient's GFR today is 59 2. Is urine output >/= 0.5 ml/kg/hr for the last 6 hours? Yes.   Patient's UOP is 0.8 ml/kg/hr 3. Is BUN < 60 mg/dL? Yes.    Patient's BUN today is 13 4. Abnormal electrolyte(s):k 3.2 5. Ordered repletion with: protocol 6. If a panic level lab has been reported, has the CCM MD in charge been notified? No..   Physician:    Ronda Fairly A 11/30/2016 6:20 AM

## 2016-11-30 NOTE — Progress Notes (Signed)
RN entered pt's room due to pt yelling out "[name], [name], come help me" repeatedly. Pt was attempting to get out of bed, was unable to say where she was at, visibly agitated. Pt stated that she was trying to go see her son in the other room and needed to use the bathroom. Attempted to redirect the pt back to bed and assist to the bedpan. Notified ELink MD, Sommer, of increase in O2 demand and new onset confusion. Received orders for stat CXR, ABG, and to initiate Bipap.   After speaking to MD, pt became upset with RN about "making her look bad" by telling the doctor about her confusion. Attempted to educate on importance of relaying accurate, objective information to the MD for her care and pt stated "you need to leave because I'm tired of talking to you (RN)."    RT placed pt on BiPAP and pt tolerated it for less than 5 minutes. Took the mask off herself, another RN placed back on 15L HFNC. O2 sat 92%, RR 28-32. RT attempting to obtain ABG now.  Will continue to monitor.

## 2016-11-30 NOTE — Consult Note (Signed)
..   Name: [name] MRN: [id number] DOB: [birth year]    ADMISSION DATE:  11/28/2016 CONSULTATION DATE:  11/28/2016  REFERRING MD : Pricilla Holm MD  CHIEF COMPLAINT: cough, SOB and diarrhea  BRIEF PATIENT DESCRIPTION: 83 yr old female with PMHx of COPD, on Home O2 at 2L,HLD, CVA, GERD, Former smoker, [name], Breast CA, HFpEF, [name], [name],Afib on Eliquis, who presents with cough, shortness breath, diarrhea. Elevated WBC lactate CXR small opacification at Right base. Hypotensive despite 3.5L IVF started on Phenylephrine ggt. PCCM consulted.  SIGNIFICANT EVENTS  Off pressors,  No distress 3 liters O2  STUDIES:  CXR LACTATE BLOOD CX X 2   SUBJECTIVE: no distress  VITAL SIGNS: Temp:  [97.3 F (36.3 C)-98 F (36.7 C)] 97.3 F (36.3 C) (09/24 0459) Pulse Rate:  [67-80] 67 (09/24 0600) Resp:  [16-36] 28 (09/24 0600) BP: (79-139)/(41-124) 108/54 (09/24 0600) SpO2:  [88 %-100 %] 91 % (09/24 0600) Weight:  [75.2 kg (165 lb 12.6 oz)] 75.2 kg (165 lb 12.6 oz) (09/24 0500)  PHYSICAL EXAMINATION: General: awake, alert, talkative Neuro: nonfocal HEENT: jvd is up now PULM: slight coarse rt base, diffuse crackles CV:  s1 s2 RRR no r GI: soft, Bs wnl, no r Extremities:  Edema min    Recent Labs Lab 11/28/16 1803 11/30/16 0340  NA 134* 140  K 4.2 3.2*  CL 102 113*  CO2 21* 20*  BUN 20 13  CREATININE 1.53* 0.99  GLUCOSE 204* 148*    Recent Labs Lab 11/28/16 1803 11/30/16 0340  HGB 11.7* 9.8*  HCT 35.4* 30.9*  WBC 23.5* 14.4*  PLT 244 209   Ct Chest Wo Contrast  Result Date: 11/29/2016 CLINICAL DATA:  COPD exacerbation short of breath EXAM: CT CHEST WITHOUT CONTRAST TECHNIQUE: Multidetector CT imaging of the chest was performed following the standard protocol without IV contrast. COMPARISON:  Radiograph 11/28/2016, CT chest 07/14/2010 FINDINGS: Cardiovascular: Limited evaluation without intravenous contrast. Mild aneurysmal dilatation of the ascending segment  up to 4.1 cm. Dense atherosclerotic calcifications. Coronary artery calcification. Mitral valvular calcification. Intracardiac pacing leads. Borderline cardiomegaly. No large pericardial effusion. Mediastinum/Nodes: Multiple enlarged mediastinal lymph nodes. Pretracheal lymph node measures 11 mm. Midline trachea. Thyroid is normal. Esophagus within normal limits. Lungs/Pleura: Moderate emphysema. Right apical parenchymal scarring. Trace pleural effusions. Ground-glass density and consolidation in the right lower lobe is suspicious for a pneumonia. Small foci of consolidation in the posterior left lung base may reflect additional foci of pneumonia. Development of more nodular appearing density within the apical portion of the left upper lobe measuring 15 x 15 mm. Upper Abdomen: No acute abnormality. Musculoskeletal: Degenerative changes. No acute or suspicious bone lesion. IMPRESSION: 1. Moderate emphysema. Ground-glass density and moderate consolidation in the right lower lobe is most consistent with a pneumonia. Imaging follow-up to resolution is recommended. There are tiny pleural effusions and small infiltrates also noted in the left lung base. 2. 15 mm nodular density in the left apical lung, could represent additional infiltrate although lung nodule is also a concern. Consider one of the following in 3 months for both low-risk and high-risk individuals: (a) repeat chest CT, (b) follow-up PET-CT, or (c) tissue sampling. This recommendation follows the consensus statement: Guidelines for Management of Incidental Pulmonary Nodules Detected on CT Images: From the Fleischner Society 2017; Radiology 2017; 284:228-243. 3. Mediastinal adenopathy, likely reactive 4. Mild aneurysmal dilatation of the ascending aorta up to 4.1 cm. Recommend annual imaging followup by CTA or MRA. This recommendation follows 2010 ACCF/AHA/AATS/ACR/ASA/SCA/SCAI/SIR/STS/SVM  Guidelines for the Diagnosis and Management of Patients with Thoracic  Aortic Disease. Circulation. 2010; 121: Y099-I338 Aortic Atherosclerosis (ICD10-I70.0) and Emphysema (ICD10-J43.9). Electronically Signed   By: Donavan Foil M.D.   On: 11/29/2016 02:27   Dg Chest Port 1 View  Result Date: 11/30/2016 CLINICAL DATA:  Aspiration precautions. EXAM: PORTABLE CHEST 1 VIEW COMPARISON:  CT of 11/29/2016.  Plain film of 11/28/2016 FINDINGS: Midline trachea. Pacer with leads at right atrium and right ventricle. No lead discontinuity. Cardiomegaly accentuated by AP portable technique. Atherosclerosis in the transverse aorta. Small bilateral pleural effusions. No pneumothorax. Mild pulmonary venous congestion, accentuated by low lung volumes. Progressive right and development of left base airspace disease. IMPRESSION: Worsened right lower lobe pneumonia or aspiration. Developing mild pulmonary venous congestion. Small bilateral pleural effusions with new left base atelectasis versus infection or aspiration. Cardiomegaly with aortic atherosclerosis. Electronically Signed   By: Abigail Miyamoto M.D.   On: 11/30/2016 06:41   Dg Chest Portable 1 View  Result Date: 11/28/2016 CLINICAL DATA:  Hypoxia. EXAM: PORTABLE CHEST 1 VIEW COMPARISON:  08/11/2016 and 04/25/2016 FINDINGS: Left-sided pacemaker unchanged. Lungs are adequately inflated demonstrate mild hazy opacification over the right base slightly worse. Mild stable chronic interstitial prominence bilaterally. Cardiomediastinal silhouette is within normal. Mild degenerate change of the spine and shoulders. IMPRESSION: Mild opacification over the right base which may be due to atelectasis or infection. Chronic stable interstitial prominence. Electronically Signed   By: Marin Olp M.D.   On: 11/28/2016 18:20    ASSESSMENT / PLAN:  NEURO: Get PT  CARDIAC: HFpEF last EF 55% on ECHO H/o SA node dysfunction s/p pacer Afib  on anticoagulation with Eliquis Has pulm edema on top Lasix start  Septic Shock Resolved MAP adequate on  own Avoided cvl!! Tele can dc  PULMONARY: COPD on Home oxygen at 2L PNA , I favor aspiration events Pulm edema ABX course 8 days Lasix IS pcxr in am  mobilize  ID: Septic Shock Aspiration PNA concerns -dc  vanc Dc zosyn Add ctx, totoal 8 days SLP  Endocrine: H/o insulin dependence On ICU Glycemic protocol BG goal 140-180mg /dl In NICE  GI: Diarrhea- medication induced Pt was constipated and received  SLP noted aspiration penetration, dysphagia  -Continue PPI  Daily oral x 14 days -Objective SLP done - recs given  Heme: Patient on anticoagulation with Eliquis DVT PPx-> SCDs and pt on eliquis Slight drop hgb, repeat in am , this is dilutional  RENAL Acute Kidney Injury resolving hypok  -supp k  -lasix kvo [place] in am    Lavon Paganini. Titus Mould, MD, [place] Pgr: [facility]

## 2016-11-30 NOTE — Progress Notes (Signed)
  Speech Language Pathology Treatment: Dysphagia  Patient Details Name: [name] MRN: [id number] DOB: 1931-06-10 Today's Date: 11/30/2016 Time: 7510-2585 SLP Time Calculation (min) (ACUTE ONLY): 14 min  Assessment / Plan / Recommendation Clinical Impression  Pt appears to do well with PO trials given at bedside, consisting of mechanical soft solids and thin liquids. Max cues were given for recall of results/recommendations from [facility] on previous date, but only Min cues were provided during intake for appropriate pacing. RN reports increased difficulty during meals when pt is trying to coordinate breathing and swallowing, and shares that she mostly eats the pureed textures off her tray. The patient has complaints of meals being laborious and time consuming. Recommend adjusting diet to Dys 2 textures, continuing thin liquids and intermittent throat clearing. Will f/u for tolerance.    HPI HPI: [name], COPD, asthma, hyperlipidemia, stroke, GERD, formersmoker, [name], s/p ofplacement, [name], [name], [name], [name], [name], who presented with cough, shortness breath, and diarrhea. Pt with acute exacerbation of COPD and CT Chest suggests RLL PNA. Patient is known to [place] with previous MBS dated 04/27/2016 which did show penetration/aspiration given large or serial sips of thin liquids.      SLP Plan  Continue with current plan of care       Recommendations  Diet recommendations: Dysphagia 2 (fine chop);Thin liquid Liquids provided via: Cup;Straw Medication Administration: Whole meds with puree Supervision: Staff to assist with self feeding;Full supervision/cueing for compensatory strategies Compensations: Slow rate;Small sips/bites;Clear throat intermittently Postural Changes and/or Swallow Maneuvers: Seated upright 90 degrees                Oral Care  Recommendations: Oral care QID Follow up Recommendations: Home health SLP SLP Visit Diagnosis: Dysphagia, oropharyngeal phase (R13.12) Plan: Continue with current plan of care       GO                Germain Osgood 11/30/2016, 4:20 PM  Germain Osgood, M.A. CCC-SLP [phone]

## 2016-11-30 NOTE — Progress Notes (Signed)
eLink Physician-Brief Progress Note Patient Name: [name] DOB: [birth year] MRN: [id number]   Date of Service  11/30/2016  HPI/Events of Note  Hypoxia - Sat 89% on 15 L/min HF mask. RR = 27. Nurse reports  increased confusion.   eICU Interventions  Will order: 1. Place patient back on BiPAP. 2. ABG at 12:30 AM. 3. Portable CXR STAT.  4. Will ask ground team to assess at bedside.      Intervention Category Major Interventions: Delirium, psychosis, severe agitation - evaluation and management  Ksean Vale Eugene 11/30/2016, 11:23 PM

## 2016-12-01 ENCOUNTER — Telehealth: Payer: Self-pay | Admitting: Internal Medicine

## 2016-12-01 ENCOUNTER — Inpatient Hospital Stay (HOSPITAL_COMMUNITY): Payer: Medicare Other

## 2016-12-01 DIAGNOSIS — J90 Pleural effusion, not elsewhere classified: Secondary | ICD-10-CM

## 2016-12-01 DIAGNOSIS — J189 Pneumonia, unspecified organism: Secondary | ICD-10-CM

## 2016-12-01 DIAGNOSIS — R6521 Severe sepsis with septic shock: Secondary | ICD-10-CM

## 2016-12-01 DIAGNOSIS — E1121 Type 2 diabetes mellitus with diabetic nephropathy: Secondary | ICD-10-CM

## 2016-12-01 DIAGNOSIS — J690 Pneumonitis due to inhalation of food and vomit: Secondary | ICD-10-CM

## 2016-12-01 DIAGNOSIS — J810 Acute pulmonary edema: Secondary | ICD-10-CM

## 2016-12-01 DIAGNOSIS — I495 Sick sinus syndrome: Secondary | ICD-10-CM

## 2016-12-01 DIAGNOSIS — I5022 Chronic systolic (congestive) heart failure: Secondary | ICD-10-CM

## 2016-12-01 DIAGNOSIS — I480 Paroxysmal atrial fibrillation: Secondary | ICD-10-CM

## 2016-12-01 DIAGNOSIS — E876 Hypokalemia: Secondary | ICD-10-CM

## 2016-12-01 LAB — CBC
HEMATOCRIT: 34 % — AB (ref 36.0–46.0)
HEMOGLOBIN: 10.9 g/dL — AB (ref 12.0–15.0)
MCH: 27.5 pg (ref 26.0–34.0)
MCHC: 32.1 g/dL (ref 30.0–36.0)
MCV: 85.6 fL (ref 78.0–100.0)
Platelets: 246 10*3/uL (ref 150–400)
RBC: 3.97 MIL/uL (ref 3.87–5.11)
RDW: 17.6 % — AB (ref 11.5–15.5)
WBC: 14.6 10*3/uL — ABNORMAL HIGH (ref 4.0–10.5)

## 2016-12-01 LAB — BASIC METABOLIC PANEL
Anion gap: 8 (ref 5–15)
Anion gap: 11 (ref 5–15)
BUN: 13 mg/dL (ref 6–20)
BUN: 11 mg/dL (ref 6–20)
CALCIUM: 8.2 mg/dL — AB (ref 8.9–10.3)
CALCIUM: 8.4 mg/dL — AB (ref 8.9–10.3)
CO2: 24 mmol/L (ref 22–32)
CO2: 24 mmol/L (ref 22–32)
Chloride: 110 mmol/L (ref 101–111)
Chloride: 106 mmol/L (ref 101–111)
Creatinine, Ser: 1.01 mg/dL — ABNORMAL HIGH (ref 0.44–1.00)
Creatinine, Ser: 0.85 mg/dL (ref 0.44–1.00)
GFR calc Af Amer: 58 mL/min — ABNORMAL LOW (ref >60)
GFR calc Af Amer: >60 mL/min (ref >60)
GFR, EST NON AFRICAN AMERICAN: 50 mL/min — AB (ref >60)
GLUCOSE: 150 mg/dL — AB (ref 65–99)
Glucose, Bld: 108 mg/dL — ABNORMAL HIGH (ref 65–99)
Potassium: 3.5 mmol/L (ref 3.5–5.1)
Potassium: 3.4 mmol/L — ABNORMAL LOW (ref 3.5–5.1)
Sodium: 142 mmol/L (ref 135–145)
Sodium: 141 mmol/L (ref 135–145)

## 2016-12-01 LAB — POCT I-STAT 3, ART BLOOD GAS (G3+)
ACID-BASE DEFICIT: 2 mmol/L (ref 0.0–2.0)
ACID-BASE EXCESS: 4 mmol/L — AB (ref 0.0–2.0)
Bicarbonate: 22.5 mmol/L (ref 20.0–28.0)
Bicarbonate: 27.6 mmol/L (ref 20.0–28.0)
O2 SAT: 89 %
O2 SAT: 98 %
Patient temperature: 98
TCO2: 24 mmol/L (ref 22–32)
TCO2: 29 mmol/L (ref 22–32)
pCO2 arterial: 35.7 mmHg (ref 32.0–48.0)
pCO2 arterial: 36.2 mmHg (ref 32.0–48.0)
pH, Arterial: 7.407 (ref 7.350–7.450)
pH, Arterial: 7.489 — ABNORMAL HIGH (ref 7.350–7.450)
pO2, Arterial: 100 mmHg (ref 83.0–108.0)
pO2, Arterial: 56 mmHg — ABNORMAL LOW (ref 83.0–108.0)

## 2016-12-01 LAB — GLUCOSE, CAPILLARY
GLUCOSE-CAPILLARY: 136 mg/dL — AB (ref 65–99)
GLUCOSE-CAPILLARY: 159 mg/dL — AB (ref 65–99)
GLUCOSE-CAPILLARY: 191 mg/dL — AB (ref 65–99)
Glucose-Capillary: 109 mg/dL — ABNORMAL HIGH (ref 65–99)
Glucose-Capillary: 164 mg/dL — ABNORMAL HIGH (ref 65–99)

## 2016-12-01 LAB — MAGNESIUM: MAGNESIUM: 1.6 mg/dL — AB (ref 1.7–2.4)

## 2016-12-01 LAB — PHOSPHORUS
PHOSPHORUS: 1.1 mg/dL — AB (ref 2.5–4.6)
PHOSPHORUS: 3.6 mg/dL (ref 2.5–4.6)

## 2016-12-01 LAB — LACTIC ACID, PLASMA: LACTIC ACID, VENOUS: 1.2 mmol/L (ref 0.5–1.9)

## 2016-12-01 MED ORDER — DEXTROSE 5 % IV SOLN
40.0000 meq | Freq: Once | INTRAVENOUS | Status: AC
  Administered 2016-12-01: 40 meq via INTRAVENOUS
  Filled 2016-12-01: qty 9.09

## 2016-12-01 MED ORDER — FUROSEMIDE 10 MG/ML IJ SOLN
60.0000 mg | Freq: Three times a day (TID) | INTRAMUSCULAR | Status: AC
  Administered 2016-12-01 (×2): 60 mg via INTRAVENOUS
  Filled 2016-12-01 (×2): qty 6

## 2016-12-01 MED ORDER — ORAL CARE MOUTH RINSE
15.0000 mL | Freq: Two times a day (BID) | OROMUCOSAL | Status: DC
  Administered 2016-12-01 – 2016-12-10 (×16): 15 mL via OROMUCOSAL

## 2016-12-01 MED ORDER — LORAZEPAM 2 MG/ML IJ SOLN: 0.5000 mg | Freq: Three times a day (TID) | INTRAMUSCULAR | Status: DC | PRN

## 2016-12-01 MED ORDER — METHYLPREDNISOLONE SODIUM SUCC 125 MG IJ SOLR
60.0000 mg | INTRAMUSCULAR | Status: DC
  Administered 2016-12-01 – 2016-12-04 (×4): 60 mg via INTRAVENOUS
  Filled 2016-12-01: qty 2
  Filled 2016-12-01 (×2): qty 0.96
  Filled 2016-12-01: qty 2

## 2016-12-01 MED ORDER — MAGNESIUM SULFATE 2 GM/50ML IV SOLN
2.0000 g | Freq: Once | INTRAVENOUS | Status: AC
  Administered 2016-12-01: 2 g via INTRAVENOUS
  Filled 2016-12-01: qty 50

## 2016-12-01 MED ORDER — POTASSIUM CHLORIDE 10 MEQ/100ML IV SOLN
10.0000 meq | INTRAVENOUS | Status: DC
  Administered 2016-12-01: 10 meq via INTRAVENOUS
  Filled 2016-12-01: qty 100

## 2016-12-01 MED ORDER — ACETYLCYSTEINE 20 % IN SOLN
2.0000 mL | Freq: Two times a day (BID) | RESPIRATORY_TRACT | Status: DC
  Administered 2016-12-02 – 2016-12-04 (×4): 2 mL via RESPIRATORY_TRACT
  Filled 2016-12-01 (×7): qty 4

## 2016-12-01 MED ORDER — LORAZEPAM 2 MG/ML IJ SOLN
1.0000 mg | INTRAMUSCULAR | Status: DC | PRN
  Filled 2016-12-01: qty 1

## 2016-12-01 MED ORDER — LORAZEPAM 2 MG/ML IJ SOLN
1.0000 mg | Freq: Two times a day (BID) | INTRAMUSCULAR | Status: DC
  Administered 2016-12-01 – 2016-12-04 (×5): 1 mg via INTRAVENOUS
  Filled 2016-12-01 (×4): qty 1

## 2016-12-01 MED ORDER — CHLORHEXIDINE GLUCONATE 0.12 % MT SOLN
15.0000 mL | Freq: Two times a day (BID) | OROMUCOSAL | Status: DC
  Administered 2016-12-02 – 2016-12-11 (×15): 15 mL via OROMUCOSAL
  Filled 2016-12-01 (×14): qty 15

## 2016-12-01 MED ORDER — PIPERACILLIN-TAZOBACTAM 3.375 G IVPB
3.3750 g | Freq: Three times a day (TID) | INTRAVENOUS | Status: DC
  Administered 2016-12-01 – 2016-12-06 (×15): 3.375 g via INTRAVENOUS
  Filled 2016-12-01 (×18): qty 50

## 2016-12-01 NOTE — Telephone Encounter (Signed)
Pt's daughter Scheryl Marten Hardaway) called asking to speak with you as soon as possible. She can be reached at [phone].

## 2016-12-01 NOTE — Evaluation (Signed)
Physical Therapy Evaluation Patient Details Name: [name] MRN: [id number] DOB: [birth year] Today's Date: 12/01/2016   History of Present Illness  Patient is a 83 y/o female who presents with cough, SOB and diarrhea. CXR small opacification at Right base. Found to be in septic shock with concerns for aspiration PNA. PMH includes chronic respiratory failure on 2 L oxygen, COPD, asthma, HLD, stroke, GERD, former smoker, [name], s/p of placement, [name], [name], [name], [name], [name].  Clinical Impression  Patient presents with generalized weakness, dyspnea on exertion, impaired balance and impaired activity tolerance s/p above. Pt now on HF Grandfather and only maintaining Sp02 ranging from 82-92%. Tolerated standing and taking a few steps along side bed but deferred OOB to chair due to 02 sats. Pt from [facility] [facility] and requires assist from aide daily for ADls/IADLs. Will follow acutely to maximize independence and mobility prior to return home.    Follow Up Recommendations Home health PT;Supervision for mobility/OOB;Supervision/Assistance - 24 hour    Equipment Recommendations  None recommended by PT    Recommendations for Other Services OT consult     Precautions / Restrictions Precautions Precautions: Fall Precaution Comments: watch 02 Restrictions Weight Bearing Restrictions: No      Mobility  Bed Mobility Overal bed mobility: Needs Assistance Bed Mobility: Supine to Sit;Sit to Supine     Supine to sit: Supervision;HOB elevated Sit to supine: Supervision;HOB elevated   General bed mobility comments: No assist needed, increased time. Use of rail. No dizziness.   Transfers Overall transfer level: Needs assistance Equipment used: 1 person hand held assist Transfers: Sit to/from Stand Sit to Stand: Min assist         General transfer comment: Assist to power to standing. Stood from [employer].   Ambulation/Gait Ambulation/Gait  assistance: Min assist Ambulation Distance (Feet): 4 Feet Assistive device: 1 person hand held assist Gait Pattern/deviations: Step-to pattern;Shuffle Gait velocity: decreased   General Gait Details: Able to side step along side bed with min A for balance. Sp02 dropped to 85% on HF [place].  Stairs            Wheelchair Mobility    Modified Rankin (Stroke Patients Only)       Balance Overall balance assessment: Needs assistance Sitting-balance support: Feet supported;No upper extremity supported Sitting balance-Leahy Scale: Good Sitting balance - Comments: Able to perform there ex sittin EOB without LOB.   Standing balance support: During functional activity Standing balance-Leahy Scale: Fair Standing balance comment: Able to stand statically without UE support but requires UE support for dynamic standing.                              Pertinent Vitals/Pain Pain Assessment: No/denies pain    Home Living Family/patient expects to be discharged to:: Assisted living ([facility])               Home Equipment: Gilford Rile - 2 wheels;Walker - 4 wheels;Cane - single point;Bedside commode;Shower seat Additional Comments: patient poor historian    Prior Function Level of Independence: Needs assistance   Gait / Transfers Assistance Needed: independent with ambulation  ADL's / Homemaking Assistance Needed: Gets assist with ADls/IADLs from a nurse who comes daily. Does not drive.  Comments: walks to dining hall with 02. Reports no falls.     Hand Dominance   Dominant Hand: Right    Extremity/Trunk Assessment   Upper Extremity  Assessment Upper Extremity Assessment: Defer to OT evaluation    Lower Extremity Assessment Lower Extremity Assessment: Generalized weakness       Communication   Communication: No difficulties  Cognition Arousal/Alertness: Awake/alert Behavior During Therapy: WFL for tasks assessed/performed Overall Cognitive  Status: No family/caregiver present to determine baseline cognitive functioning                                 General Comments: A&Ox4. Thinks we did this to her cause we increased her 02 despite MDs stating she might have aspirated.       General Comments General comments (skin integrity, edema, etc.): Sp02 ranged from 82-92% on HF [place].    Exercises     Assessment/Plan    PT Assessment Patient needs continued PT services  PT Problem List Decreased mobility;Decreased strength;Decreased balance;Cardiopulmonary status limiting activity;Decreased activity tolerance       PT Treatment Interventions Therapeutic activities;Gait training;Therapeutic exercise;Patient/family education;Balance training;Functional mobility training    PT Goals (Current goals can be found in the Care Plan section)  Acute Rehab PT Goals Patient Stated Goal: to be able to breathe better PT Goal Formulation: With patient Time For Goal Achievement: 12/15/16 Potential to Achieve Goals: Fair    Frequency Min 3X/week   Barriers to discharge        Co-evaluation               AM-PAC PT "6 Clicks" Daily Activity  Outcome Measure Difficulty turning over in bed (including adjusting bedclothes, sheets and blankets)?: None Difficulty moving from lying on back to sitting on the side of the bed? : None Difficulty sitting down on and standing up from a chair with arms (e.g., wheelchair, bedside commode, etc,.)?: Unable Help needed moving to and from a bed to chair (including a wheelchair)?: A Little Help needed walking in hospital room?: A Little Help needed climbing 3-5 steps with a railing? : A Lot 6 Click Score: 17    End of Session Equipment Utilized During Treatment: Oxygen Activity Tolerance: Treatment limited secondary to medical complications (Comment) (drop in Sp02) Patient left: in bed;with call bell/phone within reach;with bed alarm set (declined turning on SCDs) Nurse  Communication: Mobility status;Other (comment) (02) PT Visit Diagnosis: Muscle weakness (generalized) (M62.81);Other (comment);Unsteadiness on feet (R26.81) (DOE)    Time: 2637-8588 PT Time Calculation (min) (ACUTE ONLY): 25 min   Charges:   PT Evaluation $PT Eval Moderate Complexity: 1 Mod PT Treatments $Therapeutic Activity: 8-22 mins   PT G Codes:        Wray Kearns, PT, DPT [phone]    Marguarite Arbour A Joleene Burnham 12/01/2016, 11:48 AM

## 2016-12-01 NOTE — Progress Notes (Signed)
Pt. Is refusing ABG at this time. Dr. Leonidas Romberg was made aware.

## 2016-12-01 NOTE — Progress Notes (Signed)
Pharmacy Antibiotic Note  [name] is a 83 y.o. female admitted on 11/28/2016 with aspiratio pneumonia.  Pharmacy has been consulted for zosyn dosing.  WBC 14.6, Scr 1.01, CrCl ~44.   Plan: Zosyn 3.375g IV q8h (4 hour infusion). Monitor for renal function, s/s of clinical improvement.  Height: 5\' 6"  (167.6 cm) Weight: 167 lb 5.3 oz (75.9 kg) IBW/kg (Calculated) : 59.3  Temp (24hrs), Avg:98.3 F (36.8 C), Min:98 F (36.7 C), Max:98.6 F (37 C)   Recent Labs Lab 11/28/16 1803 11/28/16 1833 11/28/16 2132 11/28/16 2217 11/29/16 0138 11/29/16 0625 11/30/16 0340 12/01/16 0258  WBC 23.5*  --   --   --   --   --  14.4* 14.6*  CREATININE 1.53*  --   --   --   --   --  0.99 1.01*  LATICACIDVEN  --  2.53* 3.25* 2.6* 2.8*  --   --   --   VANCORANDOM  --   --   --   --   --  14  --   --     Estimated Creatinine Clearance: 43.9 mL/min (A) (by C-G formula based on SCr of 1.01 mg/dL (H)).    Allergies  Allergen Reactions  . Cholestatin Other (See Comments)    RAGWEED SEASON...sneezing   . Sulfur Itching    Antimicrobials this admission: 9/22 azithromycin >> 9/23 9/22 ceftriaxone x 1 >> 9/25 9/23 zosyn >> 9/24 9/23 vancomycin >> 9/24 9/25 ceftazidime >>   Microbiology results: 9/23 mrsa pcr: neg 9/23 resp cx: neg 9/23 blood cx: 9/23 urine cx: neg 9/23 Res viral panel - neg   Thank you for allowing pharmacy to be a part of this patient's care.  Kourtnie Sachs A Genesis Paget 12/01/2016 11:11 AM

## 2016-12-01 NOTE — Progress Notes (Signed)
..   Name: [name] MRN: [id number] DOB: 02-Oct-1931    ADMISSION DATE:  11/28/2016 CONSULTATION DATE:  11/28/2016  REFERRING MD : Pricilla Holm MD  CHIEF COMPLAINT: cough, SOB and diarrhea  BRIEF PATIENT DESCRIPTION: 83 yr old female with PMHx of COPD, on Home O2 at 2L,HLD, CVA, GERD, Former smoker, [name], Breast CA, HFpEF, [name], [name],Afib on Eliquis, who presents with cough, shortness breath, diarrhea. Elevated WBC lactate CXR small opacification at Right base. Hypotensive despite 3.5L IVF started on Phenylephrine ggt. Came off pressors 9/24  SIGNIFICANT EVENTS  9/24 Off pressors,    STUDIES:     SUBJECTIVE: worsening hypoxia overnight, was on 2-3 L oxygen now on high flow Able to speak in full sentences. Denies chest pain or cough Apparently choked while eating a hamburger yesterday  VITAL SIGNS: Temp:  [98.1 F (36.7 C)-98.6 F (37 C)] 98.5 F (36.9 C) (09/25 1154) Pulse Rate:  [69-80] 75 (09/25 1300) Resp:  [19-34] 20 (09/25 1300) BP: (93-144)/(39-103) 140/76 (09/25 1300) SpO2:  [85 %-98 %] 88 % (09/25 1300) FiO2 (%):  [40 %-100 %] 98 % (09/25 1321) Weight:  [167 lb 5.3 oz (75.9 kg)] 167 lb 5.3 oz (75.9 kg) (09/25 0500)  PHYSICAL EXAMINATION: General: awake, alert, talkative, elderly woman Neuro: nonfocal , alert, interactive HEENT: no  jvd  PULM: slight coarse rt base,decreased BL CV:  s1 s2 RRR no r GI: soft, Bs wnl, no r Extremities:  Edema min    Recent Labs Lab 11/28/16 1803 11/30/16 0340 12/01/16 0258  NA 134* 140 142  K 4.2 3.2* 3.5  CL 102 113* 110  CO2 21* 20* 24  BUN 20 13 13   CREATININE 1.53* 0.99 1.01*  GLUCOSE 204* 148* 108*    Recent Labs Lab 11/28/16 1803 11/30/16 0340 12/01/16 0258  HGB 11.7* 9.8* 10.9*  HCT 35.4* 30.9* 34.0*  WBC 23.5* 14.4* 14.6*  PLT 244 209 246   Dg Chest Port 1 View  Result Date: 12/01/2016 CLINICAL DATA:  Respiratory failure. EXAM: PORTABLE CHEST 1 VIEW COMPARISON:  11/30/2016. FINDINGS:  Cardiac pacer with lead tips in right atrium right ventricle. Cardiomegaly with diffuse bilateral from interstitial prominence and bilateral pleural effusions again noted. Findings consistent with congestive heart failure. Bilateral pneumonitis cannot be excluded. No pneumothorax . IMPRESSION: Cardiac pacer stable position. Cardiomegaly with diffuse bilateral pulmonary interstitial prominence and bilateral pleural effusions. Changes consistent with congestive heart failure. No interim change from prior exam . Electronically Signed   By: Marcello Moores  Register   On: 12/01/2016 06:57   Dg Chest Port 1 View  Result Date: 11/30/2016 CLINICAL DATA:  Dyspnea and hypoxia EXAM: PORTABLE CHEST 1 VIEW COMPARISON:  11/30/2016 CT 0422 hours FINDINGS: Stable borderline cardiomegaly with tortuous thoracic aorta. Aortic atherosclerosis is again noted. Midline trachea. Interval decrease in interstitial edema with small bilateral pleural effusions right greater than left. Superimposed pneumonia and/or atelectasis would be difficult to entirely exclude. Right atrial and right ventricular leads are again noted without complications. No acute nor suspicious osseous abnormality. Osteoarthritic joint space narrowing of both glenohumeral joints. IMPRESSION: Slight interval decrease in interstitial edema. Bilateral pleural effusions right greater than left with probable adjacent areas of atelectasis. Superimposed pneumonia would be difficult to entirely exclude especially at the right lung base. Electronically Signed   By: Ashley Royalty M.D.   On: 11/30/2016 23:40   Dg Chest Port 1 View  Result Date: 11/30/2016 CLINICAL DATA:  Aspiration precautions. EXAM: PORTABLE CHEST 1 VIEW COMPARISON:  CT  of 11/29/2016.  Plain film of 11/28/2016 FINDINGS: Midline trachea. Pacer with leads at right atrium and right ventricle. No lead discontinuity. Cardiomegaly accentuated by AP portable technique. Atherosclerosis in the transverse aorta. Small  bilateral pleural effusions. No pneumothorax. Mild pulmonary venous congestion, accentuated by low lung volumes. Progressive right and development of left base airspace disease. IMPRESSION: Worsened right lower lobe pneumonia or aspiration. Developing mild pulmonary venous congestion. Small bilateral pleural effusions with new left base atelectasis versus infection or aspiration. Cardiomegaly with aortic atherosclerosis. Electronically Signed   By: Abigail Miyamoto M.D.   On: 11/30/2016 06:41    ASSESSMENT / PLAN:  ACute respiratory failure, hypoxemic -worsening hypoxia 9/25? Related to aspiration COPD on Home oxygen at 2L Acute Pulm edema OSA  Plan Broaden antibiotic to Zosyn continue Lasix to negative balance Continue high flow oxygen, BiPAP if work of breathing worsens pcxr in am  CPAP qhs  CARDIAC: HFpEF last EF 55% on ECHO (takatsubo with EF recovery) H/o SA node dysfunction s/p pacer Afib  on anticoagulation with Eliquis  Septic Shock Resolved   RENAL Acute Kidney Injury resolving Hypokalemia/hypophos  -replete  ID: Septic Shock Aspiration PNA concerns Dc ceftx, resume zosyn , total 8 days   Endocrine: H/o insulin dependence On ICU Glycemic protocol BG goal 140-180mg /dl   GI: SLP noted aspiration penetration, dysphagia -Continue PPI  Daily oral x 14 days -dys 2 diet per SLP, but keep npo for now  Heme: Patient on anticoagulation with Eliquis DVT PPx-> SCDs and pt on eliquis   Daughter-in-law, Dr. Radford Pax updated , patient desires full code and reintubation although was DO NOT RESUSCITATE prior admit  The patient is critically ill with multiple organ systems failure and requires high complexity decision making for assessment and support, frequent evaluation and titration of therapies, application of advanced monitoring technologies and extensive interpretation of multiple databases. Critical Care Time devoted to patient care services described in this note  independent of APP time is 30 minutes.    Rigoberto Noel MD

## 2016-12-01 NOTE — Progress Notes (Addendum)
Spoke with Sherral Hammers on [facility] and he stated that the new range for O2 sats will be 89% - 93% with the HFNC. Will continue to monitor. Bobby Rumpf, Yuritzy Zehring E, RN 12/01/2016 10:37 AM  CCM rounded and stated that O2 requirements are > 85%. Modena Morrow E, RN 12/01/2016 11:13 AM

## 2016-12-01 NOTE — Progress Notes (Signed)
PROGRESS NOTE    [name]  KGU:[id number] DOB: [birth year] DOA: 11/28/2016 PCP: Hoyt Koch, MD   Brief Narrative:  83 y.o. WF PMHx CVA, COPD/Chronic Respiratory Failure (on 2 L O2 at home).Asthma, Former smoker, HLD,, GERD, [name], s/p of placement, IDC, Chronic Diastolic CHF, [name], [name], Breast Ca,  CKD stage III,  Presents with cough, shortness breath, diarrhea.   Patient states that she has been having cough for almost 2 weeks, which has worsened today. Patient was noted to have respiratory distress in facility. She coughs up brownish colored sputum. She was found to have hypoxic with oxygen saturation in the low 80s upon arrival. Patient is on 2 L of oxygen at all times and needs more oxygen requirement. Patient denies chest pain, fever or chills. She states that she has diarrhea since last night. She had 3 loose stool bowel movements. No nausea, vomiting, abdominal pain. She is taking Amitiza for constipation. Denies symptoms of UTI or unilateral weakness. Patient was initially hypotensive with a blood pressure 84/65, which improved to 91/43 after 2.75 L normal saline bolus in ED.    Subjective: 9/25  A/O 4, negative CP, positive SOB, positive nonproductive cough (though patient feels she has phlegm she cannot clear),negative abdominal pain, negative N/V    Assessment & Plan:   Principal Problem:   Acute on chronic respiratory failure with hypoxia (HCC) Active Problems:   Controlled diabetes mellitus type 2 with complications (HCC)   Hyperlipidemia   [name] (coronary artery disease)   Atrial fibrillation (HCC)   PACEMAKER, PERMANENT   Diastolic CHF, chronic (HCC)   GERD (gastroesophageal reflux disease)   COPD with acute exacerbation (HCC)   Sepsis ([place])   HCAP (healthcare-associated pneumonia)   Acute renal failure superimposed on stage 3 chronic kidney disease (HCC)   Diarrhea   Septic shock (HCC)   Chronic Systolic CHF (last  SE=83%)/TD node Dysfunction(unknown base weight) -S/P   Pacer. -On Eliquis -strict in and out since admission -1.3 L -Daily weight -9/25 PCXR consistent with fluid overload (bilateral pleural effusion) -Lasix 60 mg TID  Paroxysmal A-fib   -Currently in NSR -On Eliquis  Bilateral Pleural Effusion -See CHF   Septic Shock -Secondary to aspiration pneumonia vs pneumonitis/COPD exacerbation  -see aspiration pneumonia   Aspiration pneumonia/COPD exacerbation -On Home oxygen at 2L - Patient with extreme claustrophobia. Agrees to anxiety medication in order to use a nonrebreather mask. Titrate O2 to maintain SPO2 88-93% -Ativan 1 mg BID -Ativan PRN -Mucomyst -Flutter valve -Solu-Medrol 60 mg daily -DuoNeb QID -Complete 8 day course antibiotics per PCCM   Diabetes type 2 controlled with renal complication  -1/76 Hemoglobin A1c=7.0 -moderate SSI   Acute renal failure -Resolved -Trend creatinine   Recent Labs Lab 11/28/16 1803 11/30/16 0340 12/01/16 0258  CREATININE 1.53* 0.99 1.01*   Hypokalemia -potassium goal> 4 -K-Phos40 mEq  Hypomagnesemia --Magnesium goal>2 --Magnesium IV 2 g  Hypophosphatemia -See hypokalemia      DVT prophylaxis: Eliquis Code Status: Full Family Communication: None Disposition Plan: TBD   Consultants:  [facility]    Procedures/Significant Events:  Off pressors,  No distress 3 liters O2  CXR LACTATE BLOOD CX X 2   I have personally reviewed and interpreted all radiology studies and my findings are as above.  VENTILATOR SETTINGS:    Cultures 9/22 blood NGTD 9/23 MRSA by PCR negative 9/23 urine negative 9/25 sputum pending   Antimicrobials: Anti-infectives    Start     Stop  12/01/16 1400  piperacillin-tazobactam (ZOSYN) IVPB 3.375 g         11/30/16 1800  cefTRIAXone (ROCEPHIN) 1 g in dextrose 5 % 50 mL IVPB  Status:  Discontinued     12/01/16 1051   11/30/16 0900  cefTRIAXone (ROCEPHIN) 1 g in dextrose 5 % 50  mL IVPB  Status:  Discontinued     11/30/16 0901   11/29/16 2300  vancomycin (VANCOCIN) IVPB 750 mg/150 ml premix  Status:  Discontinued     11/30/16 0855   11/29/16 2200  azithromycin (ZITHROMAX) 500 mg in dextrose 5 % 250 mL IVPB  Status:  Discontinued     11/29/16 1311   11/29/16 0000  piperacillin-tazobactam (ZOSYN) IVPB 3.375 g  Status:  Discontinued     11/30/16 0855   11/28/16 2300  vancomycin (VANCOCIN) IVPB 1000 mg/200 mL premix     11/29/16 0128   11/28/16 1830  cefTRIAXone (ROCEPHIN) 1 g in dextrose 5 % 50 mL IVPB     11/28/16 1925   11/28/16 1830  azithromycin (ZITHROMAX) 500 mg in dextrose 5 % 250 mL IVPB     11/28/16 2053       Devices    LINES / TUBES:      Continuous Infusions: . sodium chloride Stopped (12/01/16 0100)  . cefTRIAXone (ROCEPHIN)  IV Stopped (11/30/16 1740)     Objective: Vitals:   12/01/16 0430 12/01/16 0500 12/01/16 0721 12/01/16 0743  BP:  (!) 127/58    Pulse: 70 69    Resp: (!) 24 (!) 29    Temp:   98.6 F (37 C)   TempSrc:   Oral   SpO2: (!) 88% 90%  94%  Weight:  167 lb 5.3 oz (75.9 kg)    Height:        Intake/Output Summary (Last 24 hours) at 12/01/16 0840 Last data filed at 12/01/16 0800  Gross per 24 hour  Intake           578.17 ml  Output             4425 ml  Net         -3846.83 ml   Filed Weights   11/29/16 0300 11/30/16 0500 12/01/16 0500  Weight: 161 lb 9.6 oz (73.3 kg) 165 lb 12.6 oz (75.2 kg) 167 lb 5.3 oz (75.9 kg)    Examination:  General: A/O 4, positive acute on chronic respiratory distress ENT: Positive moderate to severe nasal congestion secondary to high flow O2, negative gingival bleeding, Neck:  Negative scars, masses, torticollis, lymphadenopathy, JVD Lungs: Tachypnea, Diffuse rhonchi,with decreased bibasilar breath sounds, positive expiratory wheeze,  Cardiovascular: Regular rate and rhythm without murmur gallop or rub normal S1 and S2 Abdomen: negative abdominal pain, nondistended, positive  soft, bowel sounds, no rebound, no ascites, no appreciable mass Extremities: No significant cyanosis, clubbing, or edema bilateral lower extremities Skin: Negative rashes, lesions, ulcers Psychiatric:  Negative depression, positive anxiety, negative fatigue, negative mania  Central nervous system:  Cranial nerves II through XII intact, tongue/uvula midline, all extremities muscle strength 5/5, sensation intact throughout, negative dysarthria, negative expressive aphasia, negative receptive aphasia.  .     Data Reviewed: Care during the described time interval was provided by me .  I have reviewed this patient's available data, including medical history, events of note, physical examination, and all test results as part of my evaluation.   CBC:  Recent Labs Lab 11/28/16 1803 11/30/16 0340 12/01/16 0258  WBC 23.5* 14.4* 14.6*  NEUTROABS 20.6* 12.2*  --   HGB 11.7* 9.8* 10.9*  HCT 35.4* 30.9* 34.0*  MCV 84.7 85.6 85.6  PLT 244 209 976   Basic Metabolic Panel:  Recent Labs Lab 11/28/16 1803 11/30/16 0340 12/01/16 0258  NA 134* 140 142  K 4.2 3.2* 3.5  CL 102 113* 110  CO2 21* 20* 24  GLUCOSE 204* 148* 108*  BUN 20 13 13   CREATININE 1.53* 0.99 1.01*  CALCIUM 8.8* 7.7* 8.2*  MG  --   --  1.6*  PHOS  --   --  1.1*   GFR: Estimated Creatinine Clearance: 43.9 mL/min (A) (by C-G formula based on SCr of 1.01 mg/dL (H)). Liver Function Tests:  Recent Labs Lab 11/28/16 1803 11/30/16 0340  AST 16 22  ALT 9* 9*  ALKPHOS 76 56  BILITOT 1.1 0.7  PROT 7.9 6.6  ALBUMIN 2.7* 2.2*   No results for input(s): LIPASE, AMYLASE in the last 168 hours. No results for input(s): AMMONIA in the last 168 hours. Coagulation Profile:  Recent Labs Lab 11/28/16 2217  INR 2.45   Cardiac Enzymes: No results for input(s): CKTOTAL, CKMB, CKMBINDEX, TROPONINI in the last 168 hours. BNP (last 3 results) No results for input(s): PROBNP in the last 8760 hours. HbA1C: No results for  input(s): HGBA1C in the last 72 hours. CBG:  Recent Labs Lab 11/30/16 1125 11/30/16 1559 11/30/16 1942 11/30/16 2324 12/01/16 0719  GLUCAP 94 82 109* 145* 136*   Lipid Profile: No results for input(s): CHOL, HDL, LDLCALC, TRIG, CHOLHDL, LDLDIRECT in the last 72 hours. Thyroid Function Tests: No results for input(s): TSH, T4TOTAL, FREET4, T3FREE, THYROIDAB in the last 72 hours. Anemia Panel: No results for input(s): VITAMINB12, FOLATE, FERRITIN, TIBC, IRON, RETICCTPCT in the last 72 hours. Urine analysis:    Component Value Date/Time   COLORURINE YELLOW 11/29/2016 0331   APPEARANCEUR CLEAR 11/29/2016 0331   LABSPEC 1.010 11/29/2016 0331   PHURINE 5.0 11/29/2016 0331   GLUCOSEU 50 (A) 11/29/2016 0331   HGBUR SMALL (A) 11/29/2016 0331   BILIRUBINUR NEGATIVE 11/29/2016 0331   KETONESUR NEGATIVE 11/29/2016 0331   PROTEINUR NEGATIVE 11/29/2016 0331   UROBILINOGEN 0.2 10/24/2014 1433   NITRITE NEGATIVE 11/29/2016 0331   LEUKOCYTESUR NEGATIVE 11/29/2016 0331   Sepsis Labs: @LABRCNTIP (procalcitonin:4,lacticidven:4)  ) Recent Results (from the past 240 hour(s))  Blood culture (routine x 2)     Status: None (Preliminary result)   Collection Time: 11/28/16  6:40 PM  Result Value Ref Range Status   Specimen Description BLOOD LEFT ANTECUBITAL  Final   Special Requests   Final    BOTTLES DRAWN AEROBIC AND ANAEROBIC Blood Culture adequate volume   Culture NO GROWTH 2 DAYS  Final   Report Status PENDING  Incomplete  Blood culture (routine x 2)     Status: None (Preliminary result)   Collection Time: 11/28/16  6:44 PM  Result Value Ref Range Status   Specimen Description BLOOD RIGHT ANTECUBITAL  Final   Special Requests   Final    BOTTLES DRAWN AEROBIC AND ANAEROBIC Blood Culture adequate volume   Culture NO GROWTH 2 DAYS  Final   Report Status PENDING  Incomplete  Respiratory Panel by PCR     Status: None   Collection Time: 11/29/16  2:00 AM  Result Value Ref Range Status    Adenovirus NOT DETECTED NOT DETECTED Final   Coronavirus 229E NOT DETECTED NOT DETECTED Final   Coronavirus HKU1 NOT DETECTED NOT DETECTED Final   Coronavirus  NL63 NOT DETECTED NOT DETECTED Final   Coronavirus OC43 NOT DETECTED NOT DETECTED Final   Metapneumovirus NOT DETECTED NOT DETECTED Final   Rhinovirus / Enterovirus NOT DETECTED NOT DETECTED Final   Influenza A NOT DETECTED NOT DETECTED Final   Influenza B NOT DETECTED NOT DETECTED Final   Parainfluenza Virus 1 NOT DETECTED NOT DETECTED Final   Parainfluenza Virus 2 NOT DETECTED NOT DETECTED Final   Parainfluenza Virus 3 NOT DETECTED NOT DETECTED Final   Parainfluenza Virus 4 NOT DETECTED NOT DETECTED Final   Respiratory Syncytial Virus NOT DETECTED NOT DETECTED Final   Bordetella pertussis NOT DETECTED NOT DETECTED Final   Chlamydophila pneumoniae NOT DETECTED NOT DETECTED Final   Mycoplasma pneumoniae NOT DETECTED NOT DETECTED Final  Urine culture     Status: None   Collection Time: 11/29/16  3:31 AM  Result Value Ref Range Status   Specimen Description URINE, RANDOM  Final   Special Requests NONE  Final   Culture NO GROWTH  Final   Report Status 11/30/2016 FINAL  Final  MRSA PCR Screening     Status: None   Collection Time: 11/29/16  3:31 AM  Result Value Ref Range Status   MRSA by PCR NEGATIVE NEGATIVE Final    Comment:        The GeneXpert MRSA Assay (FDA approved for NASAL specimens only), is one component of a comprehensive MRSA colonization surveillance program. It is not intended to diagnose MRSA infection nor to guide or monitor treatment for MRSA infections.          Radiology Studies: Dg Chest Port 1 View  Result Date: 12/01/2016 CLINICAL DATA:  Respiratory failure. EXAM: PORTABLE CHEST 1 VIEW COMPARISON:  11/30/2016. FINDINGS: Cardiac pacer with lead tips in right atrium right ventricle. Cardiomegaly with diffuse bilateral from interstitial prominence and bilateral pleural effusions again noted.  Findings consistent with congestive heart failure. Bilateral pneumonitis cannot be excluded. No pneumothorax . IMPRESSION: Cardiac pacer stable position. Cardiomegaly with diffuse bilateral pulmonary interstitial prominence and bilateral pleural effusions. Changes consistent with congestive heart failure. No interim change from prior exam . Electronically Signed   By: Marcello Moores  Register   On: 12/01/2016 06:57   Dg Chest Port 1 View  Result Date: 11/30/2016 CLINICAL DATA:  Dyspnea and hypoxia EXAM: PORTABLE CHEST 1 VIEW COMPARISON:  11/30/2016 CT 0422 hours FINDINGS: Stable borderline cardiomegaly with tortuous thoracic aorta. Aortic atherosclerosis is again noted. Midline trachea. Interval decrease in interstitial edema with small bilateral pleural effusions right greater than left. Superimposed pneumonia and/or atelectasis would be difficult to entirely exclude. Right atrial and right ventricular leads are again noted without complications. No acute nor suspicious osseous abnormality. Osteoarthritic joint space narrowing of both glenohumeral joints. IMPRESSION: Slight interval decrease in interstitial edema. Bilateral pleural effusions right greater than left with probable adjacent areas of atelectasis. Superimposed pneumonia would be difficult to entirely exclude especially at the right lung base. Electronically Signed   By: Ashley Royalty M.D.   On: 11/30/2016 23:40   Dg Chest Port 1 View  Result Date: 11/30/2016 CLINICAL DATA:  Aspiration precautions. EXAM: PORTABLE CHEST 1 VIEW COMPARISON:  CT of 11/29/2016.  Plain film of 11/28/2016 FINDINGS: Midline trachea. Pacer with leads at right atrium and right ventricle. No lead discontinuity. Cardiomegaly accentuated by AP portable technique. Atherosclerosis in the transverse aorta. Small bilateral pleural effusions. No pneumothorax. Mild pulmonary venous congestion, accentuated by low lung volumes. Progressive right and development of left base airspace disease.  IMPRESSION: Worsened right  lower lobe pneumonia or aspiration. Developing mild pulmonary venous congestion. Small bilateral pleural effusions with new left base atelectasis versus infection or aspiration. Cardiomegaly with aortic atherosclerosis. Electronically Signed   By: Abigail Miyamoto M.D.   On: 11/30/2016 06:41        Scheduled Meds: . apixaban  5 mg Oral BID  . atorvastatin  20 mg Oral Daily  . chlorhexidine  15 mL Mouth Rinse BID  . dextromethorphan  30 mg Oral BID  . guaiFENesin  600 mg Oral BID  . Influenza vac split quadrivalent PF  0.5 mL Intramuscular Tomorrow-1000  . insulin aspart  0-15 Units Subcutaneous Q4H  . ipratropium-albuterol  3 mL Nebulization Q6H  . loratadine  10 mg Oral Daily  . mouth rinse  15 mL Mouth Rinse q12n4p  . montelukast  10 mg Oral QHS  . multivitamin with minerals  1 tablet Oral BID  . pantoprazole  40 mg Oral Daily   Continuous Infusions: . sodium chloride Stopped (12/01/16 0100)  . cefTRIAXone (ROCEPHIN)  IV Stopped (11/30/16 1740)     LOS: 3 days     Time spent: 40 minutes    Edyth Glomb, Geraldo Docker, MD [facility] Pager [phone]   If 7PM-7AM, please contact night-coverage [email or web address] Password [email or web address] 12/01/2016, 8:40 AM

## 2016-12-02 ENCOUNTER — Inpatient Hospital Stay (HOSPITAL_COMMUNITY): Payer: Medicare Other

## 2016-12-02 LAB — CBC
HCT: 35.6 % — ABNORMAL LOW (ref 36.0–46.0)
HEMOGLOBIN: 11.6 g/dL — AB (ref 12.0–15.0)
MCH: 27.5 pg (ref 26.0–34.0)
MCHC: 32.6 g/dL (ref 30.0–36.0)
MCV: 84.4 fL (ref 78.0–100.0)
Platelets: 277 10*3/uL (ref 150–400)
RBC: 4.22 MIL/uL (ref 3.87–5.11)
RDW: 17.6 % — ABNORMAL HIGH (ref 11.5–15.5)
WBC: 12.6 10*3/uL — ABNORMAL HIGH (ref 4.0–10.5)

## 2016-12-02 LAB — BASIC METABOLIC PANEL
Anion gap: 13 (ref 5–15)
BUN: 9 mg/dL (ref 6–20)
CHLORIDE: 103 mmol/L (ref 101–111)
CO2: 24 mmol/L (ref 22–32)
Calcium: 8.3 mg/dL — ABNORMAL LOW (ref 8.9–10.3)
Creatinine, Ser: 0.87 mg/dL (ref 0.44–1.00)
GFR calc Af Amer: >60 mL/min (ref >60)
GFR calc non Af Amer: 60 mL/min — ABNORMAL LOW (ref >60)
GLUCOSE: 141 mg/dL — AB (ref 65–99)
POTASSIUM: 3.4 mmol/L — AB (ref 3.5–5.1)
Sodium: 140 mmol/L (ref 135–145)

## 2016-12-02 LAB — GLUCOSE, CAPILLARY
GLUCOSE-CAPILLARY: 132 mg/dL — AB (ref 65–99)
Glucose-Capillary: 117 mg/dL — ABNORMAL HIGH (ref 65–99)
Glucose-Capillary: 136 mg/dL — ABNORMAL HIGH (ref 65–99)
Glucose-Capillary: 153 mg/dL — ABNORMAL HIGH (ref 65–99)
Glucose-Capillary: 187 mg/dL — ABNORMAL HIGH (ref 65–99)
Glucose-Capillary: 93 mg/dL (ref 65–99)

## 2016-12-02 LAB — LACTIC ACID, PLASMA: Lactic Acid, Venous: 1.1 mmol/L (ref 0.5–1.9)

## 2016-12-02 LAB — PHOSPHORUS: Phosphorus: 2.9 mg/dL (ref 2.5–4.6)

## 2016-12-02 LAB — MAGNESIUM: Magnesium: 2 mg/dL (ref 1.7–2.4)

## 2016-12-02 MED ORDER — WHITE PETROLATUM GEL
Status: AC
  Filled 2016-12-02: qty 1

## 2016-12-02 MED ORDER — POTASSIUM CHLORIDE 10 MEQ/100ML IV SOLN
10.0000 meq | INTRAVENOUS | Status: DC
  Administered 2016-12-02: 10 meq via INTRAVENOUS
  Filled 2016-12-02: qty 100

## 2016-12-02 MED ORDER — ACETAMINOPHEN 325 MG PO TABS: 650.0000 mg | ORAL_TABLET | Freq: Four times a day (QID) | ORAL | Status: DC | PRN

## 2016-12-02 NOTE — Progress Notes (Signed)
Physical Therapy Treatment Patient Details Name: [name] MRN: [id number] DOB: Sep 23, 1931 Today's Date: 12/02/2016    History of Present Illness Patient is a 83 y/o female who presents with cough, SOB and diarrhea. CXR small opacification at Right base. Found to be in septic shock with concerns for aspiration PNA. PMH includes chronic respiratory failure on 2 L oxygen, COPD, asthma, HLD, stroke, GERD, former smoker, [name], s/p of placement, [name], [name], [name], [name], [name].    PT Comments    Pt making slow progress. Do feel she will need SNF. Daughter reports she has been to SNF at least 3 prior times.   Follow Up Recommendations  SNF     Equipment Recommendations  None recommended by PT    Recommendations for Other Services       Precautions / Restrictions Precautions Precautions: Fall Precaution Comments: watch 02 Restrictions Weight Bearing Restrictions: No    Mobility  Bed Mobility Overal bed mobility: Needs Assistance Bed Mobility: Supine to Sit;Sit to Supine     Supine to sit: Min assist;HOB elevated     General bed mobility comments: Assist to elevate trunk and bring hips to EOB  Transfers Overall transfer level: Needs assistance Equipment used: 2 person hand held assist Transfers: Sit to/from [facility] Sit to Stand: Min assist;+2 physical assistance         General transfer comment: Assist to bring hips up and for balance. Small pivotal steps from bed to recliner. Pt on non rebreather with SpO2 between 85-90%  Ambulation/Gait             General Gait Details: Not attempted due to respiratory status   Stairs            Wheelchair Mobility    Modified Rankin (Stroke Patients Only)       Balance Overall balance assessment: Needs assistance Sitting-balance support: Feet supported;No upper extremity supported Sitting balance-Leahy Scale: Good     Standing balance support:  During functional activity;Bilateral upper extremity supported Standing balance-Leahy Scale: Poor Standing balance comment: UE support and min assist                            Cognition Arousal/Alertness: Awake/alert Behavior During Therapy: WFL for tasks assessed/performed Overall Cognitive Status: Impaired/Different from baseline Area of Impairment: Safety/judgement;Awareness;Following commands;Memory                     Memory: Decreased short-term memory Following Commands: Follows multi-step commands inconsistently Safety/Judgement: Decreased awareness of safety;Decreased awareness of deficits Awareness: Intellectual   General Comments: Daughter reports pt has been hallucinating at times      Exercises      General Comments        Pertinent Vitals/Pain Pain Assessment: No/denies pain    Home Living                      Prior Function            PT Goals (current goals can now be found in the care plan section) Progress towards PT goals: Progressing toward goals    Frequency    Min 3X/week      PT Plan Discharge plan needs to be updated    Co-evaluation              AM-PAC PT "6 Clicks" Daily Activity  Outcome Measure  Difficulty turning over  in bed (including adjusting bedclothes, sheets and blankets)?: Unable Difficulty moving from lying on back to sitting on the side of the bed? : Unable Difficulty sitting down on and standing up from a chair with arms (e.g., wheelchair, bedside commode, etc,.)?: Unable Help needed moving to and from a bed to chair (including a wheelchair)?: A Little Help needed walking in hospital room?: Total Help needed climbing 3-5 steps with a railing? : Total 6 Click Score: 8    End of Session Equipment Utilized During Treatment: Oxygen Activity Tolerance: Treatment limited secondary to medical complications (Comment) (respiratory status) Patient left: with call bell/phone within reach;in  chair;with chair alarm set;with family/visitor present (declined turning on SCDs) Nurse Communication: Mobility status PT Visit Diagnosis: Muscle weakness (generalized) (M62.81);Other (comment);Unsteadiness on feet (R26.81) (low SpO2)     Time: 1035-1050 PT Time Calculation (min) (ACUTE ONLY): 15 min  Charges:  $Therapeutic Activity: 8-22 mins                    G Codes:       [facility] PT [place] 12/02/2016, 1:50 PM

## 2016-12-02 NOTE — Progress Notes (Signed)
[facility] ADULT ICU REPLACEMENT PROTOCOL FOR AM LAB REPLACEMENT ONLY  The patient does apply for the [facility] Adult ICU Electrolyte Replacment Protocol based on the criteria listed below:   1. Is GFR >/= 40 ml/min? Yes.    Patient's GFR today is >60 2. Is urine output >/= 0.5 ml/kg/hr for the last 6 hours? Yes.   Patient's UOP is 2.1 ml/kg/hr 3. Is BUN < 60 mg/dL? Yes.    Patient's BUN today is 9 4. Abnormal electrolyte(s): Potassium 3.4 5. Ordered repletion with: Potassium per protocol 6. If a panic level lab has been reported, has the CCM MD in charge been notified? No..   Physician:    [name] 12/02/2016 4:51 AM

## 2016-12-02 NOTE — Progress Notes (Signed)
[facility] TEAM 1 - Stepdown/ICU TEAM  [name]  TTS:[id number] DOB: [birth year] DOA: 11/28/2016 PCP: Hoyt Koch, MD    Brief Narrative:  83yo F with Hx of COPD on home O2 at 2L, HLD, CVA, GERD, [name], Breast CA, CHF, CKD stage 3, [name], and Afib on Eliquis who presented with cough, shortness of breath, and diarrhea. ED w/u noted elevated WBC and lactate w/ CXR revealing a small opacification at the R base. She remained hypotensive despite 3.5L IVF and was therefore started on Phenylephrine gtt.    Significant Events: 9/22 admit to [facility] 9/23 PCCM assumed care  9/26 TRH assumed care   Subjective: The pt is somnolent, but able to answer some basic questions.  She denies cp, n/v, abdom pain.  She reports some mild ongoing dyspnea.    Assessment & Plan:  Acute hypoxic respiratory failure BIPAP prn - multifactorial (see issues below) - appears to be slowly improving - wean O2 as able   Aspiration PNA To complete 8 days of Zosyn tx - sepsis indicators are improving   COPD on Home oxygen at 2L Well compensated at this time   OSA CPAP QHS   Diastolic CHF - Acute Pulm edema TTE 08/2016 noted EF 55% - hx of [place] with EF recovery - baseline wgt appears to be ~71-72kg - cont diuresis w/ goal of continued negative balance - net negative ~4L thus far   [employer]   12/01/16 0500 12/01/16 1639 12/02/16 0500  Weight: 75.9 kg (167 lb 5.3 oz) 72.5 kg (159 lb 14.4 oz) 70.7 kg (155 lb 13.8 oz)    SA node dysfunction s/p pacer   Chronic Afib on anticoagulation with Eliquis CHA2DS2-VASc Score is 7 - rate controlled   Septic Shock Resolved  Acute Kidney Injury Resolved - baseline crt 0.8-1.0 - back to baseline   Recent Labs Lab 11/28/16 1803 11/30/16 0340 12/01/16 0258 12/01/16 1817 12/02/16 0301  CREATININE 1.53* 0.99 1.01* 0.85 0.87   Hypokalemia Cont to supplement to goal of 4.0  DM2 BG goal 140-180mg /dl - within range at this time   Dysphagia  SLP  following - D2 diet per SLP but npo for now - consider advancing diet if resp status stabilizes further   HLD   DVT prophylaxis: Eliquis  Code Status: FULL CODE Family Communication: no family present at time of exam  Disposition Plan: SDU - cont PT/OT - advance diet if resp status improves further   Consultants:  PCCM  Antimicrobials:  Zosyn 9/22 >  Objective: Blood pressure (!) 143/68, pulse 70, temperature 97.6 F (36.4 C), temperature source Oral, resp. rate 18, height 5\' 6"  (1.676 m), weight 70.7 kg (155 lb 13.8 oz), SpO2 99 %.  Intake/Output Summary (Last 24 hours) at 12/02/16 0901 Last data filed at 12/02/16 0800  Gross per 24 hour  Intake              750 ml  Output             4060 ml  Net            -3310 ml   Filed Weights   12/01/16 0500 12/01/16 1639 12/02/16 0500  Weight: 75.9 kg (167 lb 5.3 oz) 72.5 kg (159 lb 14.4 oz) 70.7 kg (155 lb 13.8 oz)    Examination: General: no acute respiratory distress at rest on FM O2 Lungs: mild blunting of breath sounds in B bases - no wheezing  Cardiovascular: regular rate and rhythm without  murmur  Abdomen: nontender, nondistended, soft, bowel sounds positive, no rebound, no ascites, no appreciable mass Extremities: no edema bilateral lower extremities  CBC:  Recent Labs Lab 11/28/16 1803 11/30/16 0340 12/01/16 0258 12/02/16 0301  WBC 23.5* 14.4* 14.6* 12.6*  NEUTROABS 20.6* 12.2*  --   --   HGB 11.7* 9.8* 10.9* 11.6*  HCT 35.4* 30.9* 34.0* 35.6*  MCV 84.7 85.6 85.6 84.4  PLT 244 209 246 935   Basic Metabolic Panel:  Recent Labs Lab 11/28/16 1803 11/30/16 0340 12/01/16 0258 12/01/16 1817 12/02/16 0301  NA 134* 140 142 141 140  K 4.2 3.2* 3.5 3.4* 3.4*  CL 102 113* 110 106 103  CO2 21* 20* 24 24 24   GLUCOSE 204* 148* 108* 150* 141*  BUN 20 13 13 11 9   CREATININE 1.53* 0.99 1.01* 0.85 0.87  CALCIUM 8.8* 7.7* 8.2* 8.4* 8.3*  MG  --   --  1.6*  --  2.0  PHOS  --   --  1.1* 3.6 2.9   GFR: Estimated  Creatinine Clearance: 45.9 mL/min (by C-G formula based on SCr of 0.87 mg/dL).  Liver Function Tests:  Recent Labs Lab 11/28/16 1803 11/30/16 0340  AST 16 22  ALT 9* 9*  ALKPHOS 76 56  BILITOT 1.1 0.7  PROT 7.9 6.6  ALBUMIN 2.7* 2.2*    Coagulation Profile:  Recent Labs Lab 11/28/16 2217  INR 2.45    HbA1C: Hgb A1c MFr Bld  Date/Time Value Ref Range Status  04/26/2016 03:10 AM 7.0 (H) 4.8 - 5.6 % Final    Comment:    (NOTE)         Pre-diabetes: 5.7 - 6.4         Diabetes: >6.4         Glycemic control for adults with diabetes: <7.0   11/22/2015 01:57 PM 7.1 (H) 4.6 - 6.5 % Final    Comment:    Glycemic Control Guidelines for People with Diabetes:Non Diabetic:  <6%Goal of Therapy: <7%Additional Action Suggested:  >8%     CBG:  Recent Labs Lab 12/01/16 1515 12/01/16 1944 12/01/16 2357 12/02/16 0350 12/02/16 0747  GLUCAP 159* 191* 164* 153* 132*    Recent Results (from the past 240 hour(s))  Blood culture (routine x 2)     Status: None (Preliminary result)   Collection Time: 11/28/16  6:40 PM  Result Value Ref Range Status   Specimen Description BLOOD LEFT ANTECUBITAL  Final   Special Requests   Final    BOTTLES DRAWN AEROBIC AND ANAEROBIC Blood Culture adequate volume   Culture NO GROWTH 3 DAYS  Final   Report Status PENDING  Incomplete  Blood culture (routine x 2)     Status: None (Preliminary result)   Collection Time: 11/28/16  6:44 PM  Result Value Ref Range Status   Specimen Description BLOOD RIGHT ANTECUBITAL  Final   Special Requests   Final    BOTTLES DRAWN AEROBIC AND ANAEROBIC Blood Culture adequate volume   Culture NO GROWTH 3 DAYS  Final   Report Status PENDING  Incomplete  Respiratory Panel by PCR     Status: None   Collection Time: 11/29/16  2:00 AM  Result Value Ref Range Status   Adenovirus NOT DETECTED NOT DETECTED Final   Coronavirus 229E NOT DETECTED NOT DETECTED Final   Coronavirus HKU1 NOT DETECTED NOT DETECTED Final    Coronavirus NL63 NOT DETECTED NOT DETECTED Final   Coronavirus OC43 NOT DETECTED NOT DETECTED Final  Metapneumovirus NOT DETECTED NOT DETECTED Final   Rhinovirus / Enterovirus NOT DETECTED NOT DETECTED Final   Influenza A NOT DETECTED NOT DETECTED Final   Influenza B NOT DETECTED NOT DETECTED Final   Parainfluenza Virus 1 NOT DETECTED NOT DETECTED Final   Parainfluenza Virus 2 NOT DETECTED NOT DETECTED Final   Parainfluenza Virus 3 NOT DETECTED NOT DETECTED Final   Parainfluenza Virus 4 NOT DETECTED NOT DETECTED Final   Respiratory Syncytial Virus NOT DETECTED NOT DETECTED Final   Bordetella pertussis NOT DETECTED NOT DETECTED Final   Chlamydophila pneumoniae NOT DETECTED NOT DETECTED Final   Mycoplasma pneumoniae NOT DETECTED NOT DETECTED Final  Urine culture     Status: None   Collection Time: 11/29/16  3:31 AM  Result Value Ref Range Status   Specimen Description URINE, RANDOM  Final   Special Requests NONE  Final   Culture NO GROWTH  Final   Report Status 11/30/2016 FINAL  Final  MRSA PCR Screening     Status: None   Collection Time: 11/29/16  3:31 AM  Result Value Ref Range Status   MRSA by PCR NEGATIVE NEGATIVE Final    Comment:        The GeneXpert MRSA Assay (FDA approved for NASAL specimens only), is one component of a comprehensive MRSA colonization surveillance program. It is not intended to diagnose MRSA infection nor to guide or monitor treatment for MRSA infections.      Scheduled Meds: . acetylcysteine  2 mL Nebulization BID  . apixaban  5 mg Oral BID  . atorvastatin  20 mg Oral Daily  . chlorhexidine  15 mL Mouth Rinse BID  . dextromethorphan  30 mg Oral BID  . guaiFENesin  600 mg Oral BID  . insulin aspart  0-15 Units Subcutaneous Q4H  . ipratropium-albuterol  3 mL Nebulization Q6H  . loratadine  10 mg Oral Daily  . LORazepam  1 mg Intravenous BID  . mouth rinse  15 mL Mouth Rinse q12n4p  . methylPREDNISolone (SOLU-MEDROL) injection  60 mg  Intravenous Q24H  . montelukast  10 mg Oral QHS  . multivitamin with minerals  1 tablet Oral BID  . pantoprazole  40 mg Oral Daily     LOS: 4 days   Cherene Altes, MD [facility] Office  [phone] Pager - Text Page per Amion as per below:  On-Call/Text Page:      [email or web address]      password [email or web address]  If 7PM-7AM, please contact night-coverage [email or web address] Password [email or web address] 12/02/2016, 9:01 AM

## 2016-12-03 DIAGNOSIS — J9601 Acute respiratory failure with hypoxia: Secondary | ICD-10-CM

## 2016-12-03 LAB — GLUCOSE, CAPILLARY
Comment 1: 1
GLUCOSE-CAPILLARY: 140 mg/dL — AB (ref 65–99)
Glucose-Capillary: 118 mg/dL — ABNORMAL HIGH (ref 65–99)
Glucose-Capillary: 145 mg/dL — ABNORMAL HIGH (ref 65–99)
Glucose-Capillary: 154 mg/dL — ABNORMAL HIGH (ref 65–99)
Glucose-Capillary: 180 mg/dL — ABNORMAL HIGH (ref 65–99)
Glucose-Capillary: 193 mg/dL — ABNORMAL HIGH (ref 65–99)

## 2016-12-03 LAB — VITAMIN B12: VITAMIN B 12: 704 pg/mL (ref 180–914)

## 2016-12-03 LAB — COMPREHENSIVE METABOLIC PANEL
ALBUMIN: 2.5 g/dL — AB (ref 3.5–5.0)
ALK PHOS: 67 U/L (ref 38–126)
ALT: 10 U/L — ABNORMAL LOW (ref 14–54)
ANION GAP: 13 (ref 5–15)
AST: 15 U/L (ref 15–41)
BUN: 19 mg/dL (ref 6–20)
CALCIUM: 9.2 mg/dL (ref 8.9–10.3)
CO2: 27 mmol/L (ref 22–32)
Chloride: 103 mmol/L (ref 101–111)
Creatinine, Ser: 1.05 mg/dL — ABNORMAL HIGH (ref 0.44–1.00)
GFR calc Af Amer: 55 mL/min — ABNORMAL LOW (ref >60)
GFR calc non Af Amer: 48 mL/min — ABNORMAL LOW (ref >60)
GLUCOSE: 150 mg/dL — AB (ref 65–99)
Potassium: 3.1 mmol/L — ABNORMAL LOW (ref 3.5–5.1)
SODIUM: 143 mmol/L (ref 135–145)
Total Bilirubin: 1 mg/dL (ref 0.3–1.2)
Total Protein: 7.6 g/dL (ref 6.5–8.1)

## 2016-12-03 LAB — CBC
HEMATOCRIT: 35.8 % — AB (ref 36.0–46.0)
HEMOGLOBIN: 11.5 g/dL — AB (ref 12.0–15.0)
MCH: 27.3 pg (ref 26.0–34.0)
MCHC: 32.1 g/dL (ref 30.0–36.0)
MCV: 84.8 fL (ref 78.0–100.0)
Platelets: 310 10*3/uL (ref 150–400)
RBC: 4.22 MIL/uL (ref 3.87–5.11)
RDW: 17.4 % — ABNORMAL HIGH (ref 11.5–15.5)
WBC: 14 10*3/uL — ABNORMAL HIGH (ref 4.0–10.5)

## 2016-12-03 LAB — MAGNESIUM: MAGNESIUM: 2.2 mg/dL (ref 1.7–2.4)

## 2016-12-03 LAB — FERRITIN: FERRITIN: 419 ng/mL — AB (ref 11–307)

## 2016-12-03 LAB — POTASSIUM: POTASSIUM: 4.2 mmol/L (ref 3.5–5.1)

## 2016-12-03 LAB — CULTURE, BLOOD (ROUTINE X 2)
CULTURE: NO GROWTH
Culture: NO GROWTH
SPECIAL REQUESTS: ADEQUATE
Special Requests: ADEQUATE

## 2016-12-03 LAB — IRON AND TIBC
Iron: 35 ug/dL (ref 28–170)
Saturation Ratios: 15 % (ref 10.4–31.8)
TIBC: 228 ug/dL — ABNORMAL LOW (ref 250–450)
UIBC: 193 ug/dL

## 2016-12-03 LAB — PHOSPHORUS: Phosphorus: 2.6 mg/dL (ref 2.5–4.6)

## 2016-12-03 LAB — RETICULOCYTES
RBC.: 4.22 MIL/uL (ref 3.87–5.11)
RETIC CT PCT: 1.2 % (ref 0.4–3.1)
Retic Count, Absolute: 50.6 10*3/uL (ref 19.0–186.0)

## 2016-12-03 LAB — FOLATE: Folate: 11.2 ng/mL (ref >5.9)

## 2016-12-03 MED ORDER — POTASSIUM CHLORIDE 10 MEQ/100ML IV SOLN
10.0000 meq | INTRAVENOUS | Status: AC
  Administered 2016-12-03 (×3): 10 meq via INTRAVENOUS
  Filled 2016-12-03 (×3): qty 100

## 2016-12-03 MED ORDER — SODIUM CHLORIDE 0.9 % IV SOLN
Freq: Once | INTRAVENOUS | Status: AC
  Administered 2016-12-03: 50 mL/h via INTRAVENOUS

## 2016-12-03 MED ORDER — POTASSIUM CHLORIDE 20 MEQ/15ML (10%) PO SOLN
20.0000 meq | Freq: Once | ORAL | Status: AC
  Administered 2016-12-03: 20 meq via ORAL
  Filled 2016-12-03: qty 15

## 2016-12-03 MED ORDER — ALBUMIN HUMAN 25 % IV SOLN
25.0000 g | Freq: Once | INTRAVENOUS | Status: AC
  Administered 2016-12-03: 25 g via INTRAVENOUS
  Filled 2016-12-03: qty 50

## 2016-12-03 MED ORDER — POTASSIUM CHLORIDE 10 MEQ/100ML IV SOLN
10.0000 meq | INTRAVENOUS | Status: AC
  Administered 2016-12-03: 10 meq via INTRAVENOUS
  Filled 2016-12-03: qty 100

## 2016-12-03 MED ORDER — FUROSEMIDE 10 MG/ML IJ SOLN
60.0000 mg | Freq: Two times a day (BID) | INTRAMUSCULAR | Status: DC
  Administered 2016-12-03 – 2016-12-05 (×5): 60 mg via INTRAVENOUS
  Filled 2016-12-03 (×6): qty 6

## 2016-12-03 NOTE — Clinical Social Work Note (Signed)
Clinical Social Work Assessment  Patient Details  Name: [name] MRN: [id number] Date of Birth: 03-30-1931  Date of referral:  12/03/16               Reason for consult:  Facility Placement                Permission sought to share information with:  Family Supports Permission granted to share information::  Yes, Verbal Permission Granted  Name::     [name]  (pt's daughter)  Agency::     Relationship::     Contact Information:     Housing/Transportation Living arrangements for the past 2 months:  [place] ([employer]) Source of Information:  Patient, Adult Children Patient Interpreter Needed:  None Criminal Activity/Legal Involvement Pertinent to Current Situation/Hospitalization:  No - Comment as needed Significant Relationships:  Adult Children Lives with:  Spouse Do you feel safe going back to the place where you live?  Yes Need for family participation in patient care:  Yes (Comment)  Care giving concerns: CSW spoke with pt and pt's daughter at bedside. The main concern at this time appears to be finding out what is pt's medical needs from the doctor.    Social Worker assessment / plan:  CSW spoke with pt and pt's daughter at bedside ([name]). During this time CSW was informed that pt is from [facility] where pt and husband have lived for over a year. Per pt supports include pt's family as well as a caretaker that takes care of pt Monday, Wednesday, and Friday. Per daughter pt will need SNF after discharge as pt has no one to really care for pt while at home. Pt's husband has dementia and will not be able to help in pt's care.   CSW was informed that pt does not want [facility] or [facility] as pt has been at these facilities in the past. CSW informed family that CSW's in the hospital will work on discharge as pt progresses.   Employment status:  Retired Forensic scientist:  Medicare PT Recommendations:  [place] / Referral to community resources:  [place]  Patient/Family's Response to care:  Pt and daughter are understanding of plan of care at this time.   Patient/Family's Understanding of and Emotional Response to Diagnosis, Current Treatment, and Prognosis:  No further questions or concerns have been presented to CSW at this time.   Emotional Assessment Appearance:  Appears stated age Attitude/Demeanor/Rapport:    Affect (typically observed):  Pleasant Orientation:  Oriented to Self Alcohol / Substance use:  Not Applicable Psych involvement (Current and /or in the community):  No (Comment)  Discharge Needs  Concerns to be addressed:  No discharge needs identified Readmission within the last 30 days:  No Current discharge risk:  None Barriers to Discharge:  No Barriers Identified   Wetzel Bjornstad, [place] 12/03/2016, 8:26 AM

## 2016-12-03 NOTE — Progress Notes (Signed)
RT changed patient to high flow cannula at 8L. Vital signs stable at this time. RT will continue to monitor.

## 2016-12-03 NOTE — Progress Notes (Signed)
[facility] ADULT ICU REPLACEMENT PROTOCOL FOR AM LAB REPLACEMENT ONLY  The patient does apply for the [facility] Adult ICU Electrolyte Replacment Protocol based on the criteria listed below:   1. Is GFR >/= 40 ml/min? Yes.    Patient's GFR today is 48 2. Is urine output >/= 0.5 ml/kg/hr for the last 6 hours? Yes.   Patient's UOP is 1.13 ml/kg/hr 3. Is BUN < 60 mg/dL? Yes.    Patient's BUN today is 19 4. Abnormal electrolyte(s): Potassium 3.1 5. Ordered repletion with: Potassium per protocol 6. If a panic level lab has been reported, has the CCM MD in charge been notified? No..   Physician:    [name] 12/03/2016 4:44 AM

## 2016-12-03 NOTE — Progress Notes (Signed)
  Speech Language Pathology Treatment: Dysphagia  Patient Details Name: [name] MRN: [id number] DOB: 12/23/1931 Today's Date: 12/03/2016 Time: 7858-8502 SLP Time Calculation (min) (ACUTE ONLY): 36 min  Assessment / Plan / Recommendation Clinical Impression  SLP followed up for PO trials and readiness for diet. Pt was made NPO by physician following decreased respiratory status. Daughter at bedside this date. Pt on non rebreather at 15 Liters per minute and was able tolerate intermittent removal with replacement of rebreather following each PO trial. Pt with intermittent cough and delayed throat clear following thin liquid cup sips. Cued for smaller cup sips though pt with reduced control with self feeding and intermittent impulsivity with self feeding.  Note prior objective swallow studies revealing reduced airway protection with larger cup sips of thin liquids. Thin liquids by teaspoon without overt s/sx of aspiration. Recommend thin liquids by teaspoon and dysphagia 2 (fine chopped) consistencies with medicines whole in puree. Full supervision indicated with all PO to ensure reinforcement of safe swallow precautions. ST to continue to closely monitor.     HPI HPI: [name], COPD, asthma, hyperlipidemia, stroke, GERD, formersmoker, [name], s/p ofplacement, [name], [name], [name], [name], [name], who presented with cough, shortness breath, and diarrhea. Pt with acute exacerbation of COPD and CT Chest suggests RLL PNA. Patient is known to [place] with previous MBS dated 04/27/2016 which did show penetration/aspiration given large or serial sips of thin liquids.      SLP Plan  Continue with current plan of care       Recommendations  Diet recommendations: Dysphagia 2 (fine chop);Thin liquid (thin liquids by teaspoon) Liquids provided via: Teaspoon Medication  Administration: Whole meds with puree Supervision: Staff to assist with self feeding;Full supervision/cueing for compensatory strategies Compensations: Slow rate;Small sips/bites;Clear throat intermittently Postural Changes and/or Swallow Maneuvers: Seated upright 90 degrees                Oral Care Recommendations: Oral care BID Follow up Recommendations: Skilled Nursing facility;24 hour supervision/assistance SLP Visit Diagnosis: Dysphagia, oropharyngeal phase (R13.12) Plan: Continue with current plan of care       [place] MA, [place] Acute Care Speech Language Pathologist    Levi Aland 12/03/2016, 10:06 AM

## 2016-12-03 NOTE — NC FL2 (Signed)
[place] MEDICAID FL2 LEVEL OF CARE SCREENING TOOL     IDENTIFICATION  Patient Name: [name] Birthdate: [birth year] Sex: female Admission Date (Current Location): 11/28/2016  [facility] and [place] Number:  Herbalist and Address:  [facility], [place] [address] [place], [place] [address]      Provider Number: [id number]  Attending Physician Name and Address:  Allie Bossier, MD  Relative Name and Phone Number:       Current Level of Care: Hospital Recommended Level of Care: [place] Prior Approval Number:    Date Approved/Denied:   PASRR Number:   [id number]  Discharge Plan: SNF    Current Diagnoses: Patient Active Problem List   Diagnosis Date Noted  . Septic shock ([place]) 11/29/2016  . Acute on chronic respiratory failure with hypoxia ([place]) 11/28/2016  . HCAP (healthcare-associated pneumonia) 11/28/2016  . Acute renal failure superimposed on stage 3 chronic kidney disease ([place]) 11/28/2016  . Diarrhea 11/28/2016  . Gait instability 06/22/2016  . Recurrent falls 06/22/2016  . Chronic respiratory failure ([place]) 05/15/2016  . SOB (shortness of breath)   . Community acquired pneumonia 04/25/2016  . Sepsis ([place])   . Rotator cuff tear arthropathy of both shoulders 12/09/2015  . Tremors of nervous system 05/21/2015  . Complete heart block ([place]) 02/13/2015  . Acute blood loss anemia 01/28/2015  . Constipation 11/07/2014  . OA (osteoarthritis) of hip 10/31/2014  . Chest pain, atypical 06/07/2014  . Osteoporosis 01/10/2014  . Nicotine dependence in remission 06/28/2013  . COPD with acute exacerbation ([place]) 05/29/2013  . GERD (gastroesophageal reflux disease) 07/19/2012  . Depression 06/06/2012  . Secondary hyperparathyroidism ([place]) 04/25/2012  . Gold stage C. COPD with frequent exacerbations 12/15/2011  . Diastolic CHF, chronic ([place]) 12/15/2011  . Routine health maintenance 08/27/2011  . History of stroke  02/21/2010  . PACEMAKER, PERMANENT 02/21/2010  . Breast cancer of upper-inner quadrant of left female breast ([place]) 02/03/2010  . Allergic rhinitis 12/31/2009  . Carotid stenosis 08/17/2008  . Controlled diabetes mellitus type 2 with complications ([place]) 16/96/7893  . Obstructive sleep apnea 06/01/2008  . Atrial fibrillation ([place]) 06/01/2008  . [name] 06/01/2008  . Hyperlipidemia 12/30/2006  . [name] (coronary artery disease) 12/30/2006    Orientation RESPIRATION BLADDER Height & Weight     Self  O2 (Partial Rebreather Mask. 15L) Continent Weight: 149 lb 14.6 oz (68 kg) Height:  5\' 6"  (167.6 cm)  BEHAVIORAL SYMPTOMS/MOOD NEUROLOGICAL BOWEL NUTRITION STATUS      Continent Diet (please see discharge summary.)  AMBULATORY STATUS COMMUNICATION OF NEEDS Skin   Limited Assist Verbally Normal                       Personal Care Assistance Level of Assistance  Bathing, Feeding, Dressing Bathing Assistance: Maximum assistance Feeding assistance: Limited assistance Dressing Assistance: Maximum assistance     Functional Limitations Info  Sight, Hearing, Speech Sight Info: Adequate Hearing Info: Adequate Speech Info: Adequate    SPECIAL CARE FACTORS FREQUENCY  PT (By licensed PT), OT (By licensed OT)     PT Frequency: 5 times a week. OT Frequency: 5 times a week            Contractures Contractures Info: Not present    Additional Factors Info  Code Status, Allergies Code Status Info: DNR Allergies Info: Cholestatin, Sulfur           Current Medications (12/03/2016):  This is the current  hospital active medication list Current Facility-Administered Medications  Medication Dose Route Frequency Provider Last Rate Last Dose  . 0.9 %  sodium chloride infusion   Intravenous Continuous Raylene Miyamoto, MD   Stopped at 12/01/16 0100  . acetaminophen (TYLENOL) tablet 650 mg  650 mg Oral Q6H PRN Cherene Altes, MD      . acetylcysteine (MUCOMYST) 20 % nebulizer / oral  solution 2 mL  2 mL Nebulization BID Allie Bossier, MD   2 mL at 12/03/16 0813  . apixaban (ELIQUIS) tablet 5 mg  5 mg Oral BID Ivor Costa, MD   5 mg at 11/30/16 2204  . atorvastatin (LIPITOR) tablet 20 mg  20 mg Oral Daily Ivor Costa, MD   20 mg at 11/30/16 2202  . chlorhexidine (PERIDEX) 0.12 % solution 15 mL  15 mL Mouth Rinse BID Allie Bossier, MD   15 mL at 12/02/16 0915  . dextromethorphan (DELSYM) 30 MG/5ML liquid 30 mg  30 mg Oral BID Raylene Miyamoto, MD   30 mg at 11/30/16 2205  . guaiFENesin (MUCINEX) 12 hr tablet 600 mg  600 mg Oral BID Ivor Costa, MD   600 mg at 11/30/16 2204  . insulin aspart (novoLOG) injection 0-15 Units  0-15 Units Subcutaneous Q4H Raylene Miyamoto, MD   2 Units at 12/03/16 [id number]  . ipratropium-albuterol (DUONEB) 0.5-2.5 (3) MG/3ML nebulizer solution 3 mL  3 mL Nebulization Q6H Raylene Miyamoto, MD   3 mL at 12/03/16 0813  . loratadine (CLARITIN) tablet 10 mg  10 mg Oral Daily Ivor Costa, MD   10 mg at 11/30/16 0623  . LORazepam (ATIVAN) injection 1 mg  1 mg Intravenous BID Allie Bossier, MD   1 mg at 12/02/16 2300  . LORazepam (ATIVAN) injection 1-2 mg  1-2 mg Intravenous Q4H PRN Allie Bossier, MD      . MEDLINE mouth rinse  15 mL Mouth Rinse q12n4p Allie Bossier, MD   15 mL at 12/02/16 1155  . methylPREDNISolone sodium succinate (SOLU-MEDROL) 125 mg/2 mL injection 60 mg  60 mg Intravenous Q24H Allie Bossier, MD   60 mg at 12/02/16 1346  . montelukast (SINGULAIR) tablet 10 mg  10 mg Oral QHS Ivor Costa, MD   10 mg at 11/30/16 2204  . multivitamin with minerals tablet 1 tablet  1 tablet Oral BID Ivor Costa, MD   1 tablet at 11/30/16 2204  . ondansetron (ZOFRAN) injection 4 mg  4 mg Intravenous Q8H PRN Ivor Costa, MD   4 mg at 11/30/16 0932  . pantoprazole (PROTONIX) EC tablet 40 mg  40 mg Oral Daily Raylene Miyamoto, MD   40 mg at 11/30/16 7628  . piperacillin-tazobactam (ZOSYN) IVPB 3.375 g  3.375 g Intravenous Q8H Kara Mead V, MD 12.5  mL/hr at 12/03/16 0623 3.375 g at 12/03/16 0623  . potassium chloride 10 mEq in 100 mL IVPB  10 mEq Intravenous Q1 Hr x 2 Allie Bossier, MD      . zolpidem (AMBIEN) tablet 5 mg  5 mg Oral QHS PRN Ivor Costa, MD         Discharge Medications: Please see discharge summary for a list of discharge medications.  Relevant Imaging Results:  Relevant Lab Results:   Additional Information [id number]  Wetzel Bjornstad, LCSWA

## 2016-12-03 NOTE — Progress Notes (Signed)
PROGRESS NOTE    [name]  FHL:[id number] DOB: 05-05-1931 DOA: 11/28/2016 PCP: Hoyt Koch, MD   Brief Narrative:  83 y.o. WF PMHx CVA, COPD/Chronic Respiratory Failure (on 2 L O2 at home).Asthma, Former smoker, HLD,, GERD, [name], Complete Heart Block s/p of placement, pacer, Chronic Diastolic CHF, [name], [name], Breast Ca,  CKD stage III,  Presents with cough, shortness breath, diarrhea.   Patient states that she has been having cough for almost 2 weeks, which has worsened today. Patient was noted to have respiratory distress in facility. She coughs up brownish colored sputum. She was found to have hypoxic with oxygen saturation in the low 80s upon arrival. Patient is on 2 L of oxygen at all times and needs more oxygen requirement. Patient denies chest pain, fever or chills. She states that she has diarrhea since last night. She had 3 loose stool bowel movements. No nausea, vomiting, abdominal pain. She is taking Amitiza for constipation. Denies symptoms of UTI or unilateral weakness. Patient was initially hypotensive with a blood pressure 84/65, which improved to 91/43 after 2.75 L normal saline bolus in ED.    Subjective: 9/27 A/O 4, negative CP, positive SOB, positive productive cough, negative abdominal pain, negative N/V. Much more awake and verbal today. Biggest complaint is wanting to eat and drink.       Assessment & Plan:   Principal Problem:   Acute on chronic respiratory failure with hypoxia (HCC) Active Problems:   Controlled diabetes mellitus type 2 with complications (HCC)   Hyperlipidemia   [name] (coronary artery disease)   Atrial fibrillation (HCC)   PACEMAKER, PERMANENT   Diastolic CHF, chronic (HCC)   GERD (gastroesophageal reflux disease)   COPD with acute exacerbation (HCC)   Sepsis ([place])   HCAP (healthcare-associated pneumonia)   Acute renal failure superimposed on stage 3 chronic kidney disease (HCC)   Diarrhea   Septic  shock (HCC)   Chronic Systolic CHF (last HT=34%)/KA node Dysfunction(unknown base weight) -sees Dr. Sherren Mocha Cardiologist/ Dr. Caryl Comes EP -S/P   Pacer. -on Eliquis -strict in and out since admission -4.8 L -Daily weight Filed Weights   12/01/16 1639 12/02/16 0500 12/03/16 0500  Weight: 159 lb 14.4 oz (72.5 kg) 155 lb 13.8 oz (70.7 kg) 149 lb 14.6 oz (68 kg)  -9/26 PCXR consistent with fluid overload -Albumin 25 g 1 -Lasix 60 mg BID: NOTE on 40 mg BID at home  Paroxysmal A-fib   -currently NSR -On Eliquis  Bilateral Pleural Effusion -See CHF: Slightly improved   Septic Shock -Secondary to aspiration pneumonia vs pneumonitis/COPD exacerbation  -see aspiration pneumonia   Acute Respiratory Failure with Hypoxia/Aspiration pneumonia/COPD exacerbation -On Home oxygen at 2L. Sees Dr.Praveen Mannam PC CM -multifactorial CHF exacerbation(fluid overload), aspiration pneumonia, COPD exacerbation - doing well on nonrebreather -Titrate O2 to maintain SPO2 88-93%: Currently on HFNC at 8 L -Ativan 1 mg BID -Ativan PRN -Mucomyst per respiratory -Flutter valve -Solu-Medrol 60 mg daily -DuoNeb QID -Complete 8 day course antibiotics per PCCM   Diabetes type 2 controlled with renal complication  -7/68 Hemoglobin A1c=7.0 -moderate SSI   Acute renal failure -Resolved -Trend creatinine    Recent Labs Lab 11/28/16 1803 11/30/16 0340 12/01/16 0258 12/01/16 1817 12/02/16 0301 12/03/16 0223  CREATININE 1.53* 0.99 1.01* 0.85 0.87 1.05*   Hypokalemia -potassium goal> 4 -K-Phos40 mEq -Repeat K/Mg/P04 @ 1300  Hypomagnesemia --Magnesium goal>2   Hypophosphatemia -See hypokalemia      DVT prophylaxis: Eliquis Code Status: Full  Family Communication: daughter present at bedside discussed plan of care Disposition Plan: patient and daughter agree that she will need some rehabilitation at an SNF once stable   Consultants:  [facility]    Procedures/Significant Events:    9/22 admit to [facility] 9/23 PCCM assumed care  9/26 [place] assumed care  9/26 PCXR: Consistent with fluid overload     I have personally reviewed and interpreted all radiology studies and my findings are as above.  VENTILATOR SETTINGS:    Cultures 9/22 blood NGTD 9/23 MRSA by PCR negative 9/23 urine negative 9/25 sputum pending    Antimicrobials: Anti-infectives    Start     Stop   12/01/16 1400  piperacillin-tazobactam (ZOSYN) IVPB 3.375 g         11/30/16 1800  cefTRIAXone (ROCEPHIN) 1 g in dextrose 5 % 50 mL IVPB  Status:  Discontinued     12/01/16 1051   11/30/16 0900  cefTRIAXone (ROCEPHIN) 1 g in dextrose 5 % 50 mL IVPB  Status:  Discontinued     11/30/16 0901   11/29/16 2300  vancomycin (VANCOCIN) IVPB 750 mg/150 ml premix  Status:  Discontinued     11/30/16 0855   11/29/16 2200  azithromycin (ZITHROMAX) 500 mg in dextrose 5 % 250 mL IVPB  Status:  Discontinued     11/29/16 1311   11/29/16 0000  piperacillin-tazobactam (ZOSYN) IVPB 3.375 g  Status:  Discontinued     11/30/16 0855   11/28/16 2300  vancomycin (VANCOCIN) IVPB 1000 mg/200 mL premix     11/29/16 0128   11/28/16 1830  cefTRIAXone (ROCEPHIN) 1 g in dextrose 5 % 50 mL IVPB     11/28/16 1925   11/28/16 1830  azithromycin (ZITHROMAX) 500 mg in dextrose 5 % 250 mL IVPB     11/28/16 2053       Devices    LINES / TUBES:      Continuous Infusions: . sodium chloride Stopped (12/01/16 0100)  . sodium chloride 50 mL/hr (12/03/16 0624)  . piperacillin-tazobactam (ZOSYN)  IV 3.375 g (12/03/16 4696)  . potassium chloride 10 mEq (12/03/16 0627)     Objective: Vitals:   12/03/16 0500 12/03/16 0530 12/03/16 0600 12/03/16 0730  BP: (!) 165/130 120/78 139/85   Pulse: 73 73 74   Resp: (!) 23 (!) 22 (!) 23   Temp:    98.5 F (36.9 C)  TempSrc:    Oral  SpO2: 99% 98% 98%   Weight: 149 lb 14.6 oz (68 kg)     Height:        Intake/Output Summary (Last 24 hours) at 12/03/16 0812 Last data filed at  12/03/16 0400  Gross per 24 hour  Intake              160 ml  Output              630 ml  Net             -470 ml   Filed Weights   12/01/16 1639 12/02/16 0500 12/03/16 0500  Weight: 159 lb 14.4 oz (72.5 kg) 155 lb 13.8 oz (70.7 kg) 149 lb 14.6 oz (68 kg)    Physical Exam:  General: A/O 4, positive acute on chronic respiratory distress(improving now on HFNC) ENT: nasal congestion, resolving, negative gingival bleeding, Neck:  Negative scars, masses, torticollis, lymphadenopathy, JVD Lungs: Tachypnea, positive LLL crackles, decreased/absent breath sounds RLL, negative expiratory wheeze,  Cardiovascular: Regular rate and rhythm without murmur gallop  or rub normal S1 and S2 Abdomen: negative abdominal pain, nondistended, positive soft, bowel sounds, no rebound, no ascites, no appreciable mass Extremities: No significant cyanosis, clubbing, or edema bilateral lower extremities Skin: Negative rashes, lesions, ulcers Psychiatric:  Negative depression, positive anxiety, negative fatigue, negative mania  Central nervous system:  Cranial nerves II through XII intact, tongue/uvula midline, all extremities muscle strength 5/5, sensation intact throughout, negative dysarthria, negative expressive aphasia, negative receptive aphasia. .     Data Reviewed: Care during the described time interval was provided by me .  I have reviewed this patient's available data, including medical history, events of note, physical examination, and all test results as part of my evaluation.   CBC:  Recent Labs Lab 11/28/16 1803 11/30/16 0340 12/01/16 0258 12/02/16 0301 12/03/16 0223  WBC 23.5* 14.4* 14.6* 12.6* 14.0*  NEUTROABS 20.6* 12.2*  --   --   --   HGB 11.7* 9.8* 10.9* 11.6* 11.5*  HCT 35.4* 30.9* 34.0* 35.6* 35.8*  MCV 84.7 85.6 85.6 84.4 84.8  PLT 244 209 246 277 174   Basic Metabolic Panel:  Recent Labs Lab 11/30/16 0340 12/01/16 0258 12/01/16 1817 12/02/16 0301 12/03/16 0223  NA 140  142 141 140 143  K 3.2* 3.5 3.4* 3.4* 3.1*  CL 113* 110 106 103 103  CO2 20* 24 24 24 27   GLUCOSE 148* 108* 150* 141* 150*  BUN 13 13 11 9 19   CREATININE 0.99 1.01* 0.85 0.87 1.05*  CALCIUM 7.7* 8.2* 8.4* 8.3* 9.2  MG  --  1.6*  --  2.0  --   PHOS  --  1.1* 3.6 2.9  --    GFR: Estimated Creatinine Clearance: 38 mL/min (A) (by C-G formula based on SCr of 1.05 mg/dL (H)). Liver Function Tests:  Recent Labs Lab 11/28/16 1803 11/30/16 0340 12/03/16 0223  AST 16 22 15   ALT 9* 9* 10*  ALKPHOS 76 56 67  BILITOT 1.1 0.7 1.0  PROT 7.9 6.6 7.6  ALBUMIN 2.7* 2.2* 2.5*   No results for input(s): LIPASE, AMYLASE in the last 168 hours. No results for input(s): AMMONIA in the last 168 hours. Coagulation Profile:  Recent Labs Lab 11/28/16 2217  INR 2.45   Cardiac Enzymes: No results for input(s): CKTOTAL, CKMB, CKMBINDEX, TROPONINI in the last 168 hours. BNP (last 3 results) No results for input(s): PROBNP in the last 8760 hours. HbA1C: No results for input(s): HGBA1C in the last 72 hours. CBG:  Recent Labs Lab 12/02/16 1602 12/02/16 1936 12/02/16 2348 12/03/16 0334 12/03/16 0728  GLUCAP 117* 187* 136* 145* 140*   Lipid Profile: No results for input(s): CHOL, HDL, LDLCALC, TRIG, CHOLHDL, LDLDIRECT in the last 72 hours. Thyroid Function Tests: No results for input(s): TSH, T4TOTAL, FREET4, T3FREE, THYROIDAB in the last 72 hours. Anemia Panel:  Recent Labs  12/03/16 0223  VITAMINB12 704  FOLATE 11.2  FERRITIN 419*  TIBC 228*  IRON 35  RETICCTPCT 1.2   Urine analysis:    Component Value Date/Time   COLORURINE YELLOW 11/29/2016 0331   APPEARANCEUR CLEAR 11/29/2016 0331   LABSPEC 1.010 11/29/2016 0331   PHURINE 5.0 11/29/2016 0331   GLUCOSEU 50 (A) 11/29/2016 0331   HGBUR SMALL (A) 11/29/2016 0331   BILIRUBINUR NEGATIVE 11/29/2016 0331   KETONESUR NEGATIVE 11/29/2016 0331   PROTEINUR NEGATIVE 11/29/2016 0331   UROBILINOGEN 0.2 10/24/2014 1433   NITRITE  NEGATIVE 11/29/2016 0331   LEUKOCYTESUR NEGATIVE 11/29/2016 0331   Sepsis Labs: @LABRCNTIP (procalcitonin:4,lacticidven:4)  ) Recent Results (  from the past 240 hour(s))  Blood culture (routine x 2)     Status: None (Preliminary result)   Collection Time: 11/28/16  6:40 PM  Result Value Ref Range Status   Specimen Description BLOOD LEFT ANTECUBITAL  Final   Special Requests   Final    BOTTLES DRAWN AEROBIC AND ANAEROBIC Blood Culture adequate volume   Culture NO GROWTH 4 DAYS  Final   Report Status PENDING  Incomplete  Blood culture (routine x 2)     Status: None (Preliminary result)   Collection Time: 11/28/16  6:44 PM  Result Value Ref Range Status   Specimen Description BLOOD RIGHT ANTECUBITAL  Final   Special Requests   Final    BOTTLES DRAWN AEROBIC AND ANAEROBIC Blood Culture adequate volume   Culture NO GROWTH 4 DAYS  Final   Report Status PENDING  Incomplete  Respiratory Panel by PCR     Status: None   Collection Time: 11/29/16  2:00 AM  Result Value Ref Range Status   Adenovirus NOT DETECTED NOT DETECTED Final   Coronavirus 229E NOT DETECTED NOT DETECTED Final   Coronavirus HKU1 NOT DETECTED NOT DETECTED Final   Coronavirus NL63 NOT DETECTED NOT DETECTED Final   Coronavirus OC43 NOT DETECTED NOT DETECTED Final   Metapneumovirus NOT DETECTED NOT DETECTED Final   Rhinovirus / Enterovirus NOT DETECTED NOT DETECTED Final   Influenza A NOT DETECTED NOT DETECTED Final   Influenza B NOT DETECTED NOT DETECTED Final   Parainfluenza Virus 1 NOT DETECTED NOT DETECTED Final   Parainfluenza Virus 2 NOT DETECTED NOT DETECTED Final   Parainfluenza Virus 3 NOT DETECTED NOT DETECTED Final   Parainfluenza Virus 4 NOT DETECTED NOT DETECTED Final   Respiratory Syncytial Virus NOT DETECTED NOT DETECTED Final   Bordetella pertussis NOT DETECTED NOT DETECTED Final   Chlamydophila pneumoniae NOT DETECTED NOT DETECTED Final   Mycoplasma pneumoniae NOT DETECTED NOT DETECTED Final  Urine  culture     Status: None   Collection Time: 11/29/16  3:31 AM  Result Value Ref Range Status   Specimen Description URINE, RANDOM  Final   Special Requests NONE  Final   Culture NO GROWTH  Final   Report Status 11/30/2016 FINAL  Final  MRSA PCR Screening     Status: None   Collection Time: 11/29/16  3:31 AM  Result Value Ref Range Status   MRSA by PCR NEGATIVE NEGATIVE Final    Comment:        The GeneXpert MRSA Assay (FDA approved for NASAL specimens only), is one component of a comprehensive MRSA colonization surveillance program. It is not intended to diagnose MRSA infection nor to guide or monitor treatment for MRSA infections.          Radiology Studies: Dg Chest Port 1 View  Result Date: 12/02/2016 CLINICAL DATA:  Acute respiratory failure and shortness of breath. History of coronary artery disease, permanent pacemaker, COPD, former smoker. EXAM: PORTABLE CHEST 1 VIEW COMPARISON:  Portable chest x-ray of December 01, 2016 FINDINGS: The lungs are reasonably well inflated. There is pleural fluid at both bases greatest on the right. The interstitial markings are increased. The cardiac silhouette remains enlarged and the pulmonary vascularity engorged. The ICD is in stable position. There is calcification in the wall of the thoracic aorta. IMPRESSION: CHF with mild interstitial edema and small bilateral pleural effusions greatest on the right. There has been slight interval improvement in the appearance of the chest since yesterday's study. Electronically Signed  By: David  Martinique M.D.   On: 12/02/2016 07:02        Scheduled Meds: . acetylcysteine  2 mL Nebulization BID  . apixaban  5 mg Oral BID  . atorvastatin  20 mg Oral Daily  . chlorhexidine  15 mL Mouth Rinse BID  . dextromethorphan  30 mg Oral BID  . guaiFENesin  600 mg Oral BID  . insulin aspart  0-15 Units Subcutaneous Q4H  . ipratropium-albuterol  3 mL Nebulization Q6H  . loratadine  10 mg Oral Daily  .  LORazepam  1 mg Intravenous BID  . mouth rinse  15 mL Mouth Rinse q12n4p  . methylPREDNISolone (SOLU-MEDROL) injection  60 mg Intravenous Q24H  . montelukast  10 mg Oral QHS  . multivitamin with minerals  1 tablet Oral BID  . pantoprazole  40 mg Oral Daily   Continuous Infusions: . sodium chloride Stopped (12/01/16 0100)  . sodium chloride 50 mL/hr (12/03/16 0624)  . piperacillin-tazobactam (ZOSYN)  IV 3.375 g (12/03/16 9794)  . potassium chloride 10 mEq (12/03/16 0627)     LOS: 5 days     Time spent: 40 minutes    WOODS, Geraldo Docker, MD [facility] Pager [phone]   If 7PM-7AM, please contact night-coverage [email or web address] Password [email or web address] 12/03/2016, 8:12 AM

## 2016-12-03 NOTE — Care Management Important Message (Signed)
Important Message  Patient Details  Name: [name] MRN: [id number] Date of Birth: [birth year]   Medicare Important Message Given:  Yes    Kweli Grassel Mont[address] Circle 12/03/2016, 1:47 PM

## 2016-12-03 NOTE — Progress Notes (Signed)
Walking by patient room, responding to alarm on monitor to notice sats of 79%. Patient had removed her nasal cannula. Placed patient back on HFNC at 10 Lpm and didn't budge. Placed on NRB mask and sats slowly returned to 95%. RN aware.

## 2016-12-04 LAB — BLOOD GAS, ARTERIAL
Acid-Base Excess: 10.1 mmol/L — ABNORMAL HIGH (ref 0.0–2.0)
Bicarbonate: 33.9 mmol/L — ABNORMAL HIGH (ref 20.0–28.0)
Drawn by: 249101
O2 Content: 15 L/min
O2 Saturation: 78.2 %
PH ART: 7.508 — AB (ref 7.350–7.450)
PO2 ART: 43.5 mmHg — AB (ref 83.0–108.0)
Patient temperature: 98.6
pCO2 arterial: 42.9 mmHg (ref 32.0–48.0)

## 2016-12-04 LAB — GLUCOSE, CAPILLARY
GLUCOSE-CAPILLARY: 118 mg/dL — AB (ref 65–99)
GLUCOSE-CAPILLARY: 158 mg/dL — AB (ref 65–99)
GLUCOSE-CAPILLARY: 164 mg/dL — AB (ref 65–99)
Glucose-Capillary: 123 mg/dL — ABNORMAL HIGH (ref 65–99)
Glucose-Capillary: 219 mg/dL — ABNORMAL HIGH (ref 65–99)
Glucose-Capillary: 291 mg/dL — ABNORMAL HIGH (ref 65–99)

## 2016-12-04 MED ORDER — LORAZEPAM 1 MG PO TABS
1.0000 mg | ORAL_TABLET | Freq: Two times a day (BID) | ORAL | Status: DC
  Administered 2016-12-05 – 2016-12-07 (×5): 1 mg via ORAL
  Filled 2016-12-04 (×5): qty 1

## 2016-12-04 MED ORDER — STARCH (THICKENING) PO POWD
ORAL | Status: DC | PRN
  Filled 2016-12-04: qty 227

## 2016-12-04 MED ORDER — METHYLPREDNISOLONE SODIUM SUCC 40 MG IJ SOLR
40.0000 mg | INTRAMUSCULAR | Status: DC
  Administered 2016-12-05 – 2016-12-06 (×2): 40 mg via INTRAVENOUS
  Filled 2016-12-04 (×2): qty 1

## 2016-12-04 MED ORDER — LORAZEPAM 2 MG/ML IJ SOLN: 1.0000 mg | INTRAMUSCULAR | Status: DC | PRN

## 2016-12-04 MED ORDER — SODIUM CHLORIDE 0.9% FLUSH: 10.0000 mL | INTRAVENOUS | Status: DC | PRN

## 2016-12-04 MED ORDER — POTASSIUM CHLORIDE 20 MEQ/15ML (10%) PO SOLN
40.0000 meq | Freq: Once | ORAL | Status: AC
  Administered 2016-12-04: 40 meq via ORAL
  Filled 2016-12-04: qty 30

## 2016-12-04 NOTE — Progress Notes (Signed)
  Speech Language Pathology Treatment: Dysphagia  Patient Details Name: [name] MRN: [id number] DOB: 25-Nov-1931 Today's Date: 12/04/2016 Time: 1240-1300 SLP Time Calculation (min) (ACUTE ONLY): 20 min  Assessment / Plan / Recommendation Clinical Impression  ST follow up in setting of respiratory decline.  Per nurse patient's respiratory status is good today.  In addition, she reported that the patient ws totally alert this AM and fed herself breakfast.  When ST entered room today the patient was noted to be sleepy but did rouse easily to her name and gentle touch.  She required multiple cues to maintain alertness.  Nursing reported after the fact that she had given her scheduled ativan which most likely explains her lethargy.  Chart reviewed indicated that the patient has been afebrile, lungs are clear but diminished.  Chest xray from 12/02/2016 is showing CHF with mild interstitial edema and small pleural effusions as well as slight interval improvement in appearance of chest since day before.  The patient was requesting water when ST entered room.  She did not recall she was supposed to be using a spoon for all liquid intake.  Given PO intake the patient was noted to have possible delayed oral transit, delayed swallow trigger and intermittent multiple swallows.  Immediate cough was noted given thins via spoon and self fed small cup sips.  It is unclear the role that lethargy was playing.  Cough was not observed given nectar thick liquids via self fed small cup sips.  Recommend a dysphagia 2 diet with nectar thick liquids with repeat MBS next date.    HPI HPI: Ptis an 82 y.o.femalewith medical history significant of chronic respiratory failure on 2 L oxygen, COPD, asthma, hyperlipidemia, stroke, GERD, formersmoker, [name], s/p ofplacement, [name], [name], [name], [name], [name], who presented with cough, shortness breath, and diarrhea. Pt with acute  exacerbation of COPD and CT Chest suggests RLL PNA. Patient is known to [place] with previous MBS dated 04/27/2016 which did show penetration/aspiration given large or serial sips of thin liquids.      SLP Plan  MBS       Recommendations  Diet recommendations: Dysphagia 2 (fine chop);Nectar-thick liquid Liquids provided via: Cup Medication Administration: Whole meds with puree Supervision: Staff to assist with self feeding;Full supervision/cueing for compensatory strategies Compensations: Minimize environmental distractions;Slow rate;Small sips/bites Postural Changes and/or Swallow Maneuvers: Seated upright 90 degrees                Oral Care Recommendations: Oral care BID Follow up Recommendations: Skilled Nursing facility;24 hour supervision/assistance SLP Visit Diagnosis: Dysphagia, oropharyngeal phase (R13.12) Plan: MBS       GO               Shelly Flatten, MA, CCC-SLP Acute Rehab SLP [phone] Lamar Sprinkles 12/04/2016, 1:30 PM

## 2016-12-04 NOTE — Progress Notes (Signed)
ANTIBIOTIC CONSULT NOTE Pharmacy Consult for Zosyn Indication: Aspiration PNA  Allergies  Allergen Reactions  . Cholestatin Other (See Comments)    RAGWEED SEASON...sneezing   . Sulfur Itching    Patient Measurements: Height: 5\' 6"  (167.6 cm) Weight: 150 lb 9.2 oz (68.3 kg) IBW/kg (Calculated) : 59.3 Adjusted Body Weight:    Vital Signs: Temp: 97.8 F (36.6 C) (09/28 1515) Temp Source: Axillary (09/28 1515) BP: 106/58 (09/28 1515) Pulse Rate: 62 (09/28 1515) Intake/Output from previous day: 09/27 0701 - 09/28 0700 In: 330 [I.V.:80; IV Piggyback:250] Out: 1250 [Urine:1250] Intake/Output from this shift: Total I/O In: 50 [IV Piggyback:50] Out: 1525 [Urine:1525]  Labs:  Recent Labs  12/01/16 1817 12/02/16 0301 12/03/16 0223  WBC  --  12.6* 14.0*  HGB  --  11.6* 11.5*  PLT  --  277 310  CREATININE 0.85 0.87 1.05*   Estimated Creatinine Clearance: 38 mL/min (A) (by C-G formula based on SCr of 1.05 mg/dL (H)). No results for input(s): VANCOTROUGH, VANCOPEAK, VANCORANDOM, GENTTROUGH, GENTPEAK, GENTRANDOM, TOBRATROUGH, TOBRAPEAK, TOBRARND, AMIKACINPEAK, AMIKACINTROU, AMIKACIN in the last 72 hours.   Microbiology:   Medical History: Past Medical History:  Diagnosis Date  . Acute blood loss anemia 04/20/2012  . AF (atrial fibrillation) ([place])     AV ablation 9/09 [place] per Dr Ola Spurr - AV node ablation 9/11 Dr Caryl Comes  . Amiodarone pulmonary toxicity   . Asthma   . Bronchitis    hx of   . [name] (coronary artery disease)    (not sure of this 11/10  . CHF (congestive heart failure) ([place])   . Chronic renal insufficiency, stage III (moderate)    CrCl about 60 ml/min  . CKD (chronic kidney disease) stage 3, GFR 30-59 ml/min   . Complication of anesthesia    "psycotic episode" after hip surg - resolved  . COPD (chronic obstructive pulmonary disease) (HCC)    emphysema -FeV1 73% DLCO 53% 5/09  . CVA (cerebral vascular accident) ([place])    no residual effects evident  to pt   . Depression 06/06/2012  . Diabetes ([place])   . Diastolic heart failure    Acute on Chronic  . Eczema   . GERD (gastroesophageal reflux disease)   . History of skin cancer   . Hx of cardiovascular stress test    Lexiscan Myoview (09/2013):  No definite ischemia, EF 67%; low risk  . Hyperlipidemia   . Invasive ductal carcinoma of breast ([place]) 2011   LEFT   . Mental disorder   . OA (osteoarthritis) of knee    RIGHT  . Pacemaker    Permanent  . PAD (peripheral artery disease) (HCC)    w/hx right iliac/SFA stenting and left and rt leg PTA  . Pain    pt states has pain in fingertips per right hand pt states has been told may be carpal tunnel  . Pneumonia    hx of   . Right sided sciatica   . Shortness of breath dyspnea    walking distances   . Sinoatrial node dysfunction (HCC)   . Sleep apnea    associated with hypersomnia uses O2 2L/M at night and during naps also uses CPAP  . Tobacco abuse   . Tremors of nervous system    hands bilat     Assessment:  ID: Abx for aspiration pneumonia. LA 3.2 > 2.8, PCT 0.92, WBC 14, Afebrile, Scr 1.05 with CrCl 38  9/23 mrsa pcr: neg 9/23 resp cx: neg 9/22 blood cx:  Negative 9/23 urine cx: Negative 9/23 Res viral panel - neg   9/22 azithromycin >> 9/23 9/22 ceftriaxone x 1 >> 9/25 9/23 zosyn >> 9/24, 9/24 >> 9/23 vancomycin >> 9/23   Goal of Therapy:  Eradication of infection  Plan:  Zosyn 3.375g IV q 8hrs Pharmacy will sign off. Please reconsult for further dosing assitance.  Alford Highland, [employer] 12/04/2016,3:30 PM

## 2016-12-04 NOTE — Progress Notes (Signed)
[place] TEAM 1 - Stepdown/ICU TEAM  NAEVIA UNTERREINER  UDJ:[id number] DOB: 01-Mar-1932 DOA: 11/28/2016 PCP: Hoyt Koch, MD    Brief Narrative:  83yo F with Hx of COPD on home O2 at 2L, HLD, CVA, GERD, [name], Breast CA, CHF, CKD stage 3, [name], and Afib on Eliquis who presented with cough, shortness of breath, and diarrhea. ED w/u noted elevated WBC and lactate w/ CXR revealing a small opacification at the R base. She remained hypotensive despite 3.5L IVF and was therefore started on Phenylephrine gtt.    Significant Events: 9/22 admit to [facility] 9/23 PCCM assumed care  9/26 TRH assumed care   Subjective: Continues to require high-level oxygen support.  The patient is much more alert and interactive today.  She is not pleased with her restricted diet.  She denies chest pain nausea vomiting or abdominal pain.  Assessment & Plan:  Acute hypoxic respiratory failure BIPAP prn - multifactorial (see issues below) - appears to be slowly improving - wean O2 as able - currently on high flow nasal cannula   Aspiration PNA To complete 8 days of Zosyn tx - sepsis indicators are improving - clinically she is progressing - cont to follow SLP recs on intake   COPD on Home oxygen at 2L Well compensated at this time - followed by Dr. Learta Codding as outpt   OSA CPAP QHS and for prolonged naps - explained importance of this to her today at length   Chronic Diastolic CHF - Acute Pulm edema TTE 08/2016 noted EF 55% - hx of Takatsubo with EF recovery - baseline wgt appears to be ~71-72kg - cont diuresis w/ goal of continued negative balance - net negative ~7L thus far   [facility] Weights   12/02/16 0500 12/03/16 0500 12/04/16 0245  Weight: 70.7 kg (155 lb 13.8 oz) 68 kg (149 lb 14.6 oz) 68.3 kg (150 lb 9.2 oz)    SA node dysfunction s/p pacer   Chronic Afib on anticoagulation with Eliquis CHA2DS2-VASc Score is 7 - rate controlled   Septic Shock Resolved  Acute Kidney Injury Resolved - baseline crt  0.8-1.0 - back to baseline   Recent Labs Lab 11/30/16 0340 12/01/16 0258 12/01/16 1817 12/02/16 0301 12/03/16 0223  CREATININE 0.99 1.01* 0.85 0.87 1.05*   Hypokalemia Recheck in AM w/ ongoing high dose diuretic use   DM2 CBG reasonably controlled at this time   Dysphagia  SLP following - modified diet as per SLP recs   HLD   DVT prophylaxis: Eliquis  Code Status: FULL CODE Family Communication: spoke w/ husband and daughter at bedside  Disposition Plan: SDU - cont PT/OT   Consultants:  PCCM  Antimicrobials:  Zosyn 9/22 >  Objective: Blood pressure (!) 123/56, pulse 72, temperature 97.6 F (36.4 C), temperature source Oral, resp. rate 16, height 5\' 6"  (1.676 m), weight 68.3 kg (150 lb 9.2 oz), SpO2 (!) 88 %.  Intake/Output Summary (Last 24 hours) at 12/04/16 1445 Last data filed at 12/04/16 1153  Gross per 24 hour  Intake              180 ml  Output             2425 ml  Net            -2245 ml   Filed Weights   12/02/16 0500 12/03/16 0500 12/04/16 0245  Weight: 70.7 kg (155 lb 13.8 oz) 68 kg (149 lb 14.6 oz) 68.3 kg (150 lb 9.2 oz)  Examination: General: no acute respiratory distress at rest on HFNC Lungs: mild blunting of breath sounds in B bases persists - no wheezing  Cardiovascular: RRR - no M  Abdomen: NT/ND, soft, BS+, no rebound  Extremities: no edema bilateral lower extremities  CBC:  Recent Labs Lab 11/28/16 1803 11/30/16 0340 12/01/16 0258 12/02/16 0301 12/03/16 0223  WBC 23.5* 14.4* 14.6* 12.6* 14.0*  NEUTROABS 20.6* 12.2*  --   --   --   HGB 11.7* 9.8* 10.9* 11.6* 11.5*  HCT 35.4* 30.9* 34.0* 35.6* 35.8*  MCV 84.7 85.6 85.6 84.4 84.8  PLT 244 209 246 277 027   Basic Metabolic Panel:  Recent Labs Lab 11/30/16 0340 12/01/16 0258 12/01/16 1817 12/02/16 0301 12/03/16 0223 12/03/16 1229  NA 140 142 141 140 143  --   K 3.2* 3.5 3.4* 3.4* 3.1* 4.2  CL 113* 110 106 103 103  --   CO2 20* 24 24 24 27   --   GLUCOSE 148* 108*  150* 141* 150*  --   BUN 13 13 11 9 19   --   CREATININE 0.99 1.01* 0.85 0.87 1.05*  --   CALCIUM 7.7* 8.2* 8.4* 8.3* 9.2  --   MG  --  1.6*  --  2.0  --  2.2  PHOS  --  1.1* 3.6 2.9  --  2.6   GFR: Estimated Creatinine Clearance: 38 mL/min (A) (by C-G formula based on SCr of 1.05 mg/dL (H)).  Liver Function Tests:  Recent Labs Lab 11/28/16 1803 11/30/16 0340 12/03/16 0223  AST 16 22 15   ALT 9* 9* 10*  ALKPHOS 76 56 67  BILITOT 1.1 0.7 1.0  PROT 7.9 6.6 7.6  ALBUMIN 2.7* 2.2* 2.5*    Coagulation Profile:  Recent Labs Lab 11/28/16 2217  INR 2.45    HbA1C: Hgb A1c MFr Bld  Date/Time Value Ref Range Status  04/26/2016 03:10 AM 7.0 (H) 4.8 - 5.6 % Final    Comment:    (NOTE)         Pre-diabetes: 5.7 - 6.4         Diabetes: >6.4         Glycemic control for adults with diabetes: <7.0   11/22/2015 01:57 PM 7.1 (H) 4.6 - 6.5 % Final    Comment:    Glycemic Control Guidelines for People with Diabetes:Non Diabetic:  <6%Goal of Therapy: <7%Additional Action Suggested:  >8%     CBG:  Recent Labs Lab 12/03/16 1943 12/03/16 2343 12/04/16 0316 12/04/16 0826 12/04/16 1151  GLUCAP 193* 180* 219* 123* 118*    Recent Results (from the past 240 hour(s))  Blood culture (routine x 2)     Status: None   Collection Time: 11/28/16  6:40 PM  Result Value Ref Range Status   Specimen Description BLOOD LEFT ANTECUBITAL  Final   Special Requests   Final    BOTTLES DRAWN AEROBIC AND ANAEROBIC Blood Culture adequate volume   Culture NO GROWTH 5 DAYS  Final   Report Status 12/03/2016 FINAL  Final  Blood culture (routine x 2)     Status: None   Collection Time: 11/28/16  6:44 PM  Result Value Ref Range Status   Specimen Description BLOOD RIGHT ANTECUBITAL  Final   Special Requests   Final    BOTTLES DRAWN AEROBIC AND ANAEROBIC Blood Culture adequate volume   Culture NO GROWTH 5 DAYS  Final   Report Status 12/03/2016 FINAL  Final  Respiratory Panel by PCR  Status: None     Collection Time: 11/29/16  2:00 AM  Result Value Ref Range Status   Adenovirus NOT DETECTED NOT DETECTED Final   Coronavirus 229E NOT DETECTED NOT DETECTED Final   Coronavirus HKU1 NOT DETECTED NOT DETECTED Final   Coronavirus NL63 NOT DETECTED NOT DETECTED Final   Coronavirus OC43 NOT DETECTED NOT DETECTED Final   Metapneumovirus NOT DETECTED NOT DETECTED Final   Rhinovirus / Enterovirus NOT DETECTED NOT DETECTED Final   Influenza A NOT DETECTED NOT DETECTED Final   Influenza B NOT DETECTED NOT DETECTED Final   Parainfluenza Virus 1 NOT DETECTED NOT DETECTED Final   Parainfluenza Virus 2 NOT DETECTED NOT DETECTED Final   Parainfluenza Virus 3 NOT DETECTED NOT DETECTED Final   Parainfluenza Virus 4 NOT DETECTED NOT DETECTED Final   Respiratory Syncytial Virus NOT DETECTED NOT DETECTED Final   Bordetella pertussis NOT DETECTED NOT DETECTED Final   Chlamydophila pneumoniae NOT DETECTED NOT DETECTED Final   Mycoplasma pneumoniae NOT DETECTED NOT DETECTED Final  Urine culture     Status: None   Collection Time: 11/29/16  3:31 AM  Result Value Ref Range Status   Specimen Description URINE, RANDOM  Final   Special Requests NONE  Final   Culture NO GROWTH  Final   Report Status 11/30/2016 FINAL  Final  MRSA PCR Screening     Status: None   Collection Time: 11/29/16  3:31 AM  Result Value Ref Range Status   MRSA by PCR NEGATIVE NEGATIVE Final    Comment:        The GeneXpert MRSA Assay (FDA approved for NASAL specimens only), is one component of a comprehensive MRSA colonization surveillance program. It is not intended to diagnose MRSA infection nor to guide or monitor treatment for MRSA infections.      Scheduled Meds: . acetylcysteine  2 mL Nebulization BID  . apixaban  5 mg Oral BID  . atorvastatin  20 mg Oral Daily  . chlorhexidine  15 mL Mouth Rinse BID  . dextromethorphan  30 mg Oral BID  . furosemide  60 mg Intravenous BID  . guaiFENesin  600 mg Oral BID  .  insulin aspart  0-15 Units Subcutaneous Q4H  . ipratropium-albuterol  3 mL Nebulization Q6H  . loratadine  10 mg Oral Daily  . LORazepam  1 mg Intravenous BID  . mouth rinse  15 mL Mouth Rinse q12n4p  . methylPREDNISolone (SOLU-MEDROL) injection  60 mg Intravenous Q24H  . montelukast  10 mg Oral QHS  . multivitamin with minerals  1 tablet Oral BID  . pantoprazole  40 mg Oral Daily     LOS: 6 days   Cherene Altes, MD [facility] Office  [phone] Pager - Text Page per Amion as per below:  On-Call/Text Page:      [email or web address]      password [email or web address]  If 7PM-7AM, please contact night-coverage [email or web address] Password [email or web address] 12/04/2016, 2:45 PM

## 2016-12-04 NOTE — Progress Notes (Signed)
Physical Therapy Treatment Patient Details Name: [name] MRN: [id number] DOB: 1931/09/24 Today's Date: 12/04/2016    History of Present Illness Patient is a 83 y/o female who presents with cough, SOB and diarrhea. CXR small opacification at Right base. Found to be in septic shock with concerns for aspiration PNA. PMH includes chronic respiratory failure on 2 L oxygen, COPD, asthma, HLD, stroke, GERD, former smoker, [name], s/p of placement, [name], [name], [name], [name], [name].    PT Comments    Pt admitted with above diagnosis. Pt currently with functional limitations due to poor endurance and desaturation without movement. Pt limited greatly by decr sats at rest and with activity. Notified nursing that pt did not tolerate much exercise. Will continue to follow as pt able to tolerate.  Pt will benefit from skilled PT to increase their independence and safety with mobility to allow discharge to the venue listed below.     Follow Up Recommendations  SNF     Equipment Recommendations  None recommended by PT    Recommendations for Other Services OT consult     Precautions / Restrictions Precautions Precautions: Fall Precaution Comments: watch 02 Restrictions Weight Bearing Restrictions: No    Mobility  Bed Mobility               General bed mobility comments: Nursing agreed for exercises only.  Transfers                    Ambulation/Gait                 Stairs            Wheelchair Mobility    Modified Rankin (Stroke Patients Only)       Balance                                            Cognition Arousal/Alertness: Awake/alert Behavior During Therapy: WFL for tasks assessed/performed Overall Cognitive Status: Impaired/Different from baseline Area of Impairment: Safety/judgement;Awareness;Following commands;Memory                     Memory: Decreased short-term  memory Following Commands: Follows multi-step commands inconsistently Safety/Judgement: Decreased awareness of safety;Decreased awareness of deficits Awareness: Intellectual          Exercises General Exercises - Upper Extremity Shoulder Flexion: AAROM;Both;5 reps;Supine Shoulder ABduction: AAROM;Both;5 reps;Supine Shoulder Horizontal ABduction: AAROM;Both;5 reps;Supine Elbow Flexion: AAROM;Both;5 reps;Supine Elbow Extension: AAROM;Both;5 reps;Supine General Exercises - Lower Extremity Ankle Circles/Pumps: AROM;Both;5 reps;Supine Heel Slides: AROM;Both;5 reps;Supine Hip ABduction/ADduction: AROM;Both;10 reps;Supine Straight Leg Raises: AROM;Both;5 reps;Supine    General Comments General comments (skin integrity, edema, etc.): Pt was on 6LNC and sats 87% on arrival while sleeping.  Fluctuating once awake between 88-94%.  With exercise, sats 84-88% therefore did not overwork pt.  Let nursing know of pt sats at rest and with activity.       Pertinent Vitals/Pain Pain Assessment: No/denies pain    Home Living                      Prior Function            PT Goals (current goals can now be found in the care plan section) Progress towards PT goals: Not progressing toward goals - comment (limited by desaturation even without movement/exercise)  Frequency    Min 3X/week      PT Plan Current plan remains appropriate    Co-evaluation              AM-PAC PT "6 Clicks" Daily Activity  Outcome Measure  Difficulty turning over in bed (including adjusting bedclothes, sheets and blankets)?: Unable Difficulty moving from lying on back to sitting on the side of the bed? : Unable Difficulty sitting down on and standing up from a chair with arms (e.g., wheelchair, bedside commode, etc,.)?: Unable Help needed moving to and from a bed to chair (including a wheelchair)?: Total Help needed walking in hospital room?: Total Help needed climbing 3-5 steps with a  railing? : Total 6 Click Score: 6    End of Session Equipment Utilized During Treatment: Oxygen Activity Tolerance: Treatment limited secondary to medical complications (Comment) (respiratory status) Patient left: with call bell/phone within reach;in bed;with bed alarm set Nurse Communication: Mobility status PT Visit Diagnosis: Muscle weakness (generalized) (M62.81);Other (comment);Unsteadiness on feet (R26.81) (low SpO2)     Time: 1135-1145 PT Time Calculation (min) (ACUTE ONLY): 10 min  Charges:  $Therapeutic Exercise: 8-22 mins                    G Codes:       Dionisio Aragones,PT Acute Rehabilitation [phone] (pager)    Denice Paradise 12/04/2016, 1:17 PM

## 2016-12-05 LAB — COMPREHENSIVE METABOLIC PANEL
ALBUMIN: 2.9 g/dL — AB (ref 3.5–5.0)
ALT: 12 U/L — ABNORMAL LOW (ref 14–54)
AST: 20 U/L (ref 15–41)
Alkaline Phosphatase: 67 U/L (ref 38–126)
Anion gap: 15 (ref 5–15)
BILIRUBIN TOTAL: 0.9 mg/dL (ref 0.3–1.2)
BUN: 26 mg/dL — AB (ref 6–20)
CHLORIDE: 100 mmol/L — AB (ref 101–111)
CO2: 30 mmol/L (ref 22–32)
Calcium: 9.7 mg/dL (ref 8.9–10.3)
Creatinine, Ser: 1.33 mg/dL — ABNORMAL HIGH (ref 0.44–1.00)
GFR calc Af Amer: 42 mL/min — ABNORMAL LOW (ref >60)
GFR calc non Af Amer: 36 mL/min — ABNORMAL LOW (ref >60)
GLUCOSE: 220 mg/dL — AB (ref 65–99)
POTASSIUM: 2.9 mmol/L — AB (ref 3.5–5.1)
Sodium: 145 mmol/L (ref 135–145)
Total Protein: 7.9 g/dL (ref 6.5–8.1)

## 2016-12-05 LAB — GLUCOSE, CAPILLARY
GLUCOSE-CAPILLARY: 134 mg/dL — AB (ref 65–99)
GLUCOSE-CAPILLARY: 366 mg/dL — AB (ref 65–99)
Glucose-Capillary: 130 mg/dL — ABNORMAL HIGH (ref 65–99)
Glucose-Capillary: 49 mg/dL — ABNORMAL LOW (ref 65–99)
Glucose-Capillary: 101 mg/dL — ABNORMAL HIGH (ref 65–99)
Glucose-Capillary: 67 mg/dL (ref 65–99)
Glucose-Capillary: 157 mg/dL — ABNORMAL HIGH (ref 65–99)

## 2016-12-05 LAB — CBC
HEMATOCRIT: 40.8 % (ref 36.0–46.0)
Hemoglobin: 13.3 g/dL (ref 12.0–15.0)
MCH: 27.8 pg (ref 26.0–34.0)
MCHC: 32.6 g/dL (ref 30.0–36.0)
MCV: 85.2 fL (ref 78.0–100.0)
Platelets: 364 10*3/uL (ref 150–400)
RBC: 4.79 MIL/uL (ref 3.87–5.11)
RDW: 17.6 % — AB (ref 11.5–15.5)
WBC: 13.7 10*3/uL — ABNORMAL HIGH (ref 4.0–10.5)

## 2016-12-05 MED ORDER — POTASSIUM CHLORIDE CRYS ER 20 MEQ PO TBCR
40.0000 meq | EXTENDED_RELEASE_TABLET | ORAL | Status: AC
  Administered 2016-12-05 (×2): 40 meq via ORAL
  Filled 2016-12-05 (×2): qty 2

## 2016-12-05 MED ORDER — INSULIN ASPART 100 UNIT/ML ~~LOC~~ SOLN
0.0000 [IU] | Freq: Three times a day (TID) | SUBCUTANEOUS | Status: DC
  Administered 2016-12-06: 3 [IU] via SUBCUTANEOUS
  Administered 2016-12-06: 2 [IU] via SUBCUTANEOUS
  Administered 2016-12-07: 1 [IU] via SUBCUTANEOUS
  Administered 2016-12-07 – 2016-12-08 (×2): 2 [IU] via SUBCUTANEOUS
  Administered 2016-12-08: 1 [IU] via SUBCUTANEOUS
  Administered 2016-12-08: 5 [IU] via SUBCUTANEOUS
  Administered 2016-12-09 – 2016-12-10 (×2): 1 [IU] via SUBCUTANEOUS
  Administered 2016-12-11: 2 [IU] via SUBCUTANEOUS
  Administered 2016-12-11: 5 [IU] via SUBCUTANEOUS
  Administered 2016-12-11: 2 [IU] via SUBCUTANEOUS

## 2016-12-05 MED ORDER — GUAIFENESIN ER 600 MG PO TB12: 600.0000 mg | ORAL_TABLET | Freq: Two times a day (BID) | ORAL | Status: DC | PRN

## 2016-12-05 MED ORDER — DEXTROMETHORPHAN POLISTIREX ER 30 MG/5ML PO SUER
30.0000 mg | Freq: Two times a day (BID) | ORAL | Status: DC | PRN
  Filled 2016-12-05: qty 5

## 2016-12-05 NOTE — Progress Notes (Signed)
Hypoglycemic Event  CBG: 49  Treatment: Orange Juice 4oz.  Symptoms: asymptomatic  Follow-up CBG: OVAN:1916 CBG Result:67  Possible Reasons for Event: Poor oral intake  Comments/MD notified:Administered four ounces of juice re-checked patients blood sugar continues to be below seventy.  RN administered an additional four ounces of juice blood sugar re-checked at 0910 was up to 101.      [name]

## 2016-12-05 NOTE — Progress Notes (Signed)
[place] TEAM 1 - Stepdown/ICU TEAM  [name]  NKN:[id number] DOB: [birth year] DOA: 11/28/2016 PCP: Hoyt Koch, MD    Brief Narrative:  83yo F with Hx of COPD on home O2 at 2L, HLD, CVA, GERD, [name], Breast CA, CHF, CKD stage 3, [name], and Afib on Eliquis who presented with cough, shortness of breath, and diarrhea. ED w/u noted elevated WBC and lactate w/ CXR revealing a small opacification at the R base. She remained hypotensive despite 3.5L IVF and was therefore started on Phenylephrine gtt.    Significant Events: 9/22 admit to [facility] 9/23 PCCM assumed care  9/26 TRH assumed care   Subjective: The pt is resting comfortably w/ no new complaints.  She denies cp or sob.  No n/v or abdom pain.    Assessment & Plan:  Acute hypoxic respiratory failure BIPAP prn - multifactorial (see issues below) - appears to be slowly improving - wean O2 as able - currently on high flow nasal cannula   Aspiration PNA To complete 8 days of Zosyn tx today - sepsis resolved - clinically she is progressing - cont to follow SLP recs on intake   COPD on Home oxygen at 2L Well compensated at this time - followed by Dr. Learta Codding as outpt   OSA CPAP QHS and for prolonged naps - explained importance of this to her today at length, and yet she refused to comply last night - compliance long term is highly unlikely  Chronic Diastolic CHF - Acute Pulm edema TTE 08/2016 noted EF 55% - hx of Takatsubo with EF recovery - baseline wgt appears to be ~71-72kg - net negative ~8.5L thus far - w/ climbing creatinine will hold diuretic for now  [facility] Weights   12/03/16 0500 12/04/16 0245 12/05/16 0449  Weight: 68 kg (149 lb 14.6 oz) 68.3 kg (150 lb 9.2 oz) 68 kg (149 lb 14.6 oz)    SA node dysfunction s/p pacer   Chronic Afib on anticoagulation with Eliquis CHA2DS2-VASc Score is 7 - rate controlled   Acute Kidney Injury crt had normalized, but is beginning to climb again w/ diuresis - hold diuresis for now  and follow trend   Recent Labs Lab 12/01/16 0258 12/01/16 1817 12/02/16 0301 12/03/16 0223 12/05/16 0307  CREATININE 1.01* 0.85 0.87 1.05* 1.33*   Hypokalemia Due to high dose diuretic - cont to replace    DM2 CBG reasonably controlled at this time   Dysphagia  SLP following - modified diet as per SLP recs   HLD   DVT prophylaxis: Eliquis  Code Status: FULL CODE Family Communication: no family present at time of exam today  Disposition Plan: SDU - cont PT/OT   Consultants:  PCCM  Antimicrobials:  Zosyn 9/22 > 9/29  Objective: Blood pressure 133/71, pulse 74, temperature 98 F (36.7 C), temperature source Oral, resp. rate (!) 25, height 5\' 6"  (1.676 m), weight 68 kg (149 lb 14.6 oz), SpO2 90 %.  Intake/Output Summary (Last 24 hours) at 12/05/16 1717 Last data filed at 12/05/16 1651  Gross per 24 hour  Intake              500 ml  Output             1750 ml  Net            -1250 ml   Filed Weights   12/03/16 0500 12/04/16 0245 12/05/16 0449  Weight: 68 kg (149 lb 14.6 oz) 68.3 kg (150 lb 9.2  oz) 68 kg (149 lb 14.6 oz)    Examination: General: no acute respiratory distress at rest on HFNC - alert and conversant  Lungs: mild blunting of breath sounds in B bases w/o wheezing  Cardiovascular: RRR  Abdomen: NT/ND, soft, BS+, no rebound  Extremities: no edema B LE   CBC:  Recent Labs Lab 11/28/16 1803 11/30/16 0340 12/01/16 0258 12/02/16 0301 12/03/16 0223 12/05/16 0307  WBC 23.5* 14.4* 14.6* 12.6* 14.0* 13.7*  NEUTROABS 20.6* 12.2*  --   --   --   --   HGB 11.7* 9.8* 10.9* 11.6* 11.5* 13.3  HCT 35.4* 30.9* 34.0* 35.6* 35.8* 40.8  MCV 84.7 85.6 85.6 84.4 84.8 85.2  PLT 244 209 246 277 310 119   Basic Metabolic Panel:  Recent Labs Lab 12/01/16 0258 12/01/16 1817 12/02/16 0301 12/03/16 0223 12/03/16 1229 12/05/16 0307  NA 142 141 140 143  --  145  K 3.5 3.4* 3.4* 3.1* 4.2 2.9*  CL 110 106 103 103  --  100*  CO2 24 24 24 27   --  30    GLUCOSE 108* 150* 141* 150*  --  220*  BUN 13 11 9 19   --  26*  CREATININE 1.01* 0.85 0.87 1.05*  --  1.33*  CALCIUM 8.2* 8.4* 8.3* 9.2  --  9.7  MG 1.6*  --  2.0  --  2.2  --   PHOS 1.1* 3.6 2.9  --  2.6  --    GFR: Estimated Creatinine Clearance: 30 mL/min (A) (by C-G formula based on SCr of 1.33 mg/dL (H)).  Liver Function Tests:  Recent Labs Lab 11/28/16 1803 11/30/16 0340 12/03/16 0223 12/05/16 0307  AST 16 22 15 20   ALT 9* 9* 10* 12*  ALKPHOS 76 56 67 67  BILITOT 1.1 0.7 1.0 0.9  PROT 7.9 6.6 7.6 7.9  ALBUMIN 2.7* 2.2* 2.5* 2.9*    Coagulation Profile:  Recent Labs Lab 11/28/16 2217  INR 2.45    HbA1C: Hgb A1c MFr Bld  Date/Time Value Ref Range Status  04/26/2016 03:10 AM 7.0 (H) 4.8 - 5.6 % Final    Comment:    (NOTE)         Pre-diabetes: 5.7 - 6.4         Diabetes: >6.4         Glycemic control for adults with diabetes: <7.0   11/22/2015 01:57 PM 7.1 (H) 4.6 - 6.5 % Final    Comment:    Glycemic Control Guidelines for People with Diabetes:Non Diabetic:  <6%Goal of Therapy: <7%Additional Action Suggested:  >8%     CBG:  Recent Labs Lab 12/05/16 0828 12/05/16 0844 12/05/16 0910 12/05/16 1154 12/05/16 1645  GLUCAP 49* 67 101* 157* 134*    Recent Results (from the past 240 hour(s))  Blood culture (routine x 2)     Status: None   Collection Time: 11/28/16  6:40 PM  Result Value Ref Range Status   Specimen Description BLOOD LEFT ANTECUBITAL  Final   Special Requests   Final    BOTTLES DRAWN AEROBIC AND ANAEROBIC Blood Culture adequate volume   Culture NO GROWTH 5 DAYS  Final   Report Status 12/03/2016 FINAL  Final  Blood culture (routine x 2)     Status: None   Collection Time: 11/28/16  6:44 PM  Result Value Ref Range Status   Specimen Description BLOOD RIGHT ANTECUBITAL  Final   Special Requests   Final    BOTTLES DRAWN AEROBIC  AND ANAEROBIC Blood Culture adequate volume   Culture NO GROWTH 5 DAYS  Final   Report Status 12/03/2016  FINAL  Final  Respiratory Panel by PCR     Status: None   Collection Time: 11/29/16  2:00 AM  Result Value Ref Range Status   Adenovirus NOT DETECTED NOT DETECTED Final   Coronavirus 229E NOT DETECTED NOT DETECTED Final   Coronavirus HKU1 NOT DETECTED NOT DETECTED Final   Coronavirus NL63 NOT DETECTED NOT DETECTED Final   Coronavirus OC43 NOT DETECTED NOT DETECTED Final   Metapneumovirus NOT DETECTED NOT DETECTED Final   Rhinovirus / Enterovirus NOT DETECTED NOT DETECTED Final   Influenza A NOT DETECTED NOT DETECTED Final   Influenza B NOT DETECTED NOT DETECTED Final   Parainfluenza Virus 1 NOT DETECTED NOT DETECTED Final   Parainfluenza Virus 2 NOT DETECTED NOT DETECTED Final   Parainfluenza Virus 3 NOT DETECTED NOT DETECTED Final   Parainfluenza Virus 4 NOT DETECTED NOT DETECTED Final   Respiratory Syncytial Virus NOT DETECTED NOT DETECTED Final   Bordetella pertussis NOT DETECTED NOT DETECTED Final   Chlamydophila pneumoniae NOT DETECTED NOT DETECTED Final   Mycoplasma pneumoniae NOT DETECTED NOT DETECTED Final  Urine culture     Status: None   Collection Time: 11/29/16  3:31 AM  Result Value Ref Range Status   Specimen Description URINE, RANDOM  Final   Special Requests NONE  Final   Culture NO GROWTH  Final   Report Status 11/30/2016 FINAL  Final  MRSA PCR Screening     Status: None   Collection Time: 11/29/16  3:31 AM  Result Value Ref Range Status   MRSA by PCR NEGATIVE NEGATIVE Final    Comment:        The GeneXpert MRSA Assay (FDA approved for NASAL specimens only), is one component of a comprehensive MRSA colonization surveillance program. It is not intended to diagnose MRSA infection nor to guide or monitor treatment for MRSA infections.      Scheduled Meds: . apixaban  5 mg Oral BID  . atorvastatin  20 mg Oral Daily  . chlorhexidine  15 mL Mouth Rinse BID  . dextromethorphan  30 mg Oral BID  . furosemide  60 mg Intravenous BID  . guaiFENesin  600 mg Oral  BID  . insulin aspart  0-15 Units Subcutaneous Q4H  . ipratropium-albuterol  3 mL Nebulization Q6H  . loratadine  10 mg Oral Daily  . LORazepam  1 mg Oral BID  . mouth rinse  15 mL Mouth Rinse q12n4p  . methylPREDNISolone (SOLU-MEDROL) injection  40 mg Intravenous Q24H  . montelukast  10 mg Oral QHS  . multivitamin with minerals  1 tablet Oral BID  . pantoprazole  40 mg Oral Daily     LOS: 7 days   Cherene Altes, MD [facility] Office  [phone] Pager - Text Page per Amion as per below:  On-Call/Text Page:      [email or web address]      password [email or web address]  If 7PM-7AM, please contact night-coverage [email or web address] Password [facility] 12/05/2016, 5:17 PM

## 2016-12-05 NOTE — Progress Notes (Signed)
SLP Cancellation Note  Patient Details Name: [name] MRN: [id number] DOB: 1931-05-03   Cancelled treatment:       Reason Eval/Treat Not Completed: Other (comment). Fluoro schedule limited on weekends; spoke with RN who reports pt tolerating current diet, states pt was lethargic yesterday during SLP tx. Will follow up Monday for diet tolerance and to determine need for repeat instrumental assessment.  Deneise Lever, [place], [place] Speech-Language Pathologist [phone]   [name] 12/05/2016, 1:48 PM

## 2016-12-06 ENCOUNTER — Inpatient Hospital Stay (HOSPITAL_COMMUNITY): Payer: Medicare Other

## 2016-12-06 LAB — BASIC METABOLIC PANEL
Anion gap: 12 (ref 5–15)
BUN: 26 mg/dL — AB (ref 6–20)
CO2: 32 mmol/L (ref 22–32)
CREATININE: 1.42 mg/dL — AB (ref 0.44–1.00)
Calcium: 9.6 mg/dL (ref 8.9–10.3)
Chloride: 99 mmol/L — ABNORMAL LOW (ref 101–111)
GFR calc Af Amer: 38 mL/min — ABNORMAL LOW (ref >60)
GFR, EST NON AFRICAN AMERICAN: 33 mL/min — AB (ref >60)
GLUCOSE: 216 mg/dL — AB (ref 65–99)
Potassium: 3.8 mmol/L (ref 3.5–5.1)
SODIUM: 143 mmol/L (ref 135–145)

## 2016-12-06 LAB — GLUCOSE, CAPILLARY
GLUCOSE-CAPILLARY: 151 mg/dL — AB (ref 65–99)
GLUCOSE-CAPILLARY: 216 mg/dL — AB (ref 65–99)
Glucose-Capillary: 111 mg/dL — ABNORMAL HIGH (ref 65–99)
Glucose-Capillary: 181 mg/dL — ABNORMAL HIGH (ref 65–99)

## 2016-12-06 MED ORDER — TIOTROPIUM BROMIDE MONOHYDRATE 18 MCG IN CAPS
18.0000 ug | ORAL_CAPSULE | Freq: Every day | RESPIRATORY_TRACT | Status: DC
  Administered 2016-12-07 – 2016-12-10 (×4): 18 ug via RESPIRATORY_TRACT
  Filled 2016-12-06 (×3): qty 5

## 2016-12-06 MED ORDER — LEVALBUTEROL HCL 0.63 MG/3ML IN NEBU
0.6300 mg | INHALATION_SOLUTION | Freq: Four times a day (QID) | RESPIRATORY_TRACT | Status: DC
  Administered 2016-12-06 – 2016-12-07 (×4): 0.63 mg via RESPIRATORY_TRACT
  Filled 2016-12-06 (×3): qty 3

## 2016-12-06 MED ORDER — MOMETASONE FURO-FORMOTEROL FUM 200-5 MCG/ACT IN AERO
2.0000 | INHALATION_SPRAY | Freq: Two times a day (BID) | RESPIRATORY_TRACT | Status: DC
  Administered 2016-12-06 – 2016-12-11 (×10): 2 via RESPIRATORY_TRACT
  Filled 2016-12-06 (×2): qty 8.8

## 2016-12-06 NOTE — Progress Notes (Signed)
[facility] TEAM 1 - Stepdown/ICU TEAM  [name]  OZD:[id number] DOB: 1931/08/25 DOA: 11/28/2016 PCP: Hoyt Koch, MD    Brief Narrative:  83yo F with Hx of COPD on home O2 at 2L, HLD, CVA, GERD, [name], Breast CA, CHF, CKD stage 3, [name], and Afib on Eliquis who presented with cough, shortness of breath, and diarrhea. ED w/u noted elevated WBC and lactate w/ CXR revealing a small opacification at the R base. She remained hypotensive despite 3.5L IVF and was therefore started on Phenylephrine gtt.    Significant Events: 9/22 admit to [facility] 9/23 PCCM assumed care  9/26 TRH assumed care   Subjective: The patient is resting comfortably in no acute respiratory distress.  There is no evidence of uncontrolled pain.  She continues to require high flow nasal cannula oxygen  Assessment & Plan:  Acute hypoxic respiratory failure BIPAP prn - multifactorial (see issues below) - wean O2 as able - currently on high flow nasal cannula - progress is very slow  Aspiration PNA Has completed 8 days of Zosyn - sepsis resolved - cont to follow SLP recs on intake   COPD on Home oxygen at 2L compensated at this time w/ no wheezing - followed by Dr. Learta Codding as outpt   OSA CPAP QHS and for prolonged naps - explained importance of this to her at length, and yet she frequently refuses to comply - compliance long term is highly unlikely  Chronic Diastolic CHF - Acute Pulm edema TTE 08/2016 noted EF 55% - hx of Takatsubo with EF recovery - baseline wgt appears to be ~71-72kg - net negative ~8.2L thus far - w/ climbing creatinine I am holding diuretic for now  [facility] Weights   12/04/16 0245 12/05/16 0449 12/06/16 0356  Weight: 68.3 kg (150 lb 9.2 oz) 68 kg (149 lb 14.6 oz) 66.7 kg (147 lb 0.8 oz)    SA node dysfunction s/p pacer   Chronic Afib on anticoagulation with Eliquis CHA2DS2-VASc Score is 7 - rate controlled   Acute Kidney Injury crt had normalized, but is beginning to climb again w/  diuresis - holding diuresis for now - if crt not stable/improved in AM may have to fluid challenge   Recent Labs Lab 12/01/16 1817 12/02/16 0301 12/03/16 0223 12/05/16 0307 12/06/16 0238  CREATININE 0.85 0.87 1.05* 1.33* 1.42*   Hypokalemia Due to high dose diuretic - corrected     DM2 CBG reasonably controlled at this time   Dysphagia  SLP following - modified diet as per SLP recs   HLD  DVT prophylaxis: Eliquis  Code Status: FULL CODE Family Communication: no family present at time of exam today  Disposition Plan: SDU - cont PT/OT - up to chair - incentive spirometer - mobilize   Consultants:  PCCM  Antimicrobials:  Zosyn 9/22 > 9/29  Objective: Blood pressure 136/66, pulse 78, temperature (!) 97.5 F (36.4 C), temperature source Oral, resp. rate 19, height 5\' 6"  (1.676 m), weight 66.7 kg (147 lb 0.8 oz), SpO2 90 %.  Intake/Output Summary (Last 24 hours) at 12/06/16 1059 Last data filed at 12/06/16 0916  Gross per 24 hour  Intake             1240 ml  Output             1525 ml  Net             -285 ml   Filed Weights   12/04/16 0245 12/05/16 0449 12/06/16 0356  Weight: 68.3 kg (150 lb 9.2 oz) 68 kg (149 lb 14.6 oz) 66.7 kg (147 lb 0.8 oz)    Examination: General: no acute respiratory distress on HFNC Lungs: mild blunting of breath sounds in B bases - no active wheezing  Cardiovascular: RRR - no rub   Abdomen: NT/ND, soft, BS+, no rebound  Extremities: no edema bilateral LE   CBC:  Recent Labs Lab 11/30/16 0340 12/01/16 0258 12/02/16 0301 12/03/16 0223 12/05/16 0307  WBC 14.4* 14.6* 12.6* 14.0* 13.7*  NEUTROABS 12.2*  --   --   --   --   HGB 9.8* 10.9* 11.6* 11.5* 13.3  HCT 30.9* 34.0* 35.6* 35.8* 40.8  MCV 85.6 85.6 84.4 84.8 85.2  PLT 209 246 277 310 235   Basic Metabolic Panel:  Recent Labs Lab 12/01/16 0258 12/01/16 1817 12/02/16 0301 12/03/16 0223 12/03/16 1229 12/05/16 0307 12/06/16 0238  NA 142 141 140 143  --  145 143  K  3.5 3.4* 3.4* 3.1* 4.2 2.9* 3.8  CL 110 106 103 103  --  100* 99*  CO2 24 24 24 27   --  30 32  GLUCOSE 108* 150* 141* 150*  --  220* 216*  BUN 13 11 9 19   --  26* 26*  CREATININE 1.01* 0.85 0.87 1.05*  --  1.33* 1.42*  CALCIUM 8.2* 8.4* 8.3* 9.2  --  9.7 9.6  MG 1.6*  --  2.0  --  2.2  --   --   PHOS 1.1* 3.6 2.9  --  2.6  --   --    GFR: Estimated Creatinine Clearance: 28.1 mL/min (A) (by C-G formula based on SCr of 1.42 mg/dL (H)).  Liver Function Tests:  Recent Labs Lab 11/30/16 0340 12/03/16 0223 12/05/16 0307  AST 22 15 20   ALT 9* 10* 12*  ALKPHOS 56 67 67  BILITOT 0.7 1.0 0.9  PROT 6.6 7.6 7.9  ALBUMIN 2.2* 2.5* 2.9*    HbA1C: Hgb A1c MFr Bld  Date/Time Value Ref Range Status  04/26/2016 03:10 AM 7.0 (H) 4.8 - 5.6 % Final    Comment:    (NOTE)         Pre-diabetes: 5.7 - 6.4         Diabetes: >6.4         Glycemic control for adults with diabetes: <7.0   11/22/2015 01:57 PM 7.1 (H) 4.6 - 6.5 % Final    Comment:    Glycemic Control Guidelines for People with Diabetes:Non Diabetic:  <6%Goal of Therapy: <7%Additional Action Suggested:  >8%     CBG:  Recent Labs Lab 12/05/16 0910 12/05/16 1154 12/05/16 1645 12/05/16 2148 12/06/16 0813  GLUCAP 101* 157* 134* 366* 151*    Recent Results (from the past 240 hour(s))  Blood culture (routine x 2)     Status: None   Collection Time: 11/28/16  6:40 PM  Result Value Ref Range Status   Specimen Description BLOOD LEFT ANTECUBITAL  Final   Special Requests   Final    BOTTLES DRAWN AEROBIC AND ANAEROBIC Blood Culture adequate volume   Culture NO GROWTH 5 DAYS  Final   Report Status 12/03/2016 FINAL  Final  Blood culture (routine x 2)     Status: None   Collection Time: 11/28/16  6:44 PM  Result Value Ref Range Status   Specimen Description BLOOD RIGHT ANTECUBITAL  Final   Special Requests   Final    BOTTLES DRAWN AEROBIC AND ANAEROBIC Blood Culture  adequate volume   Culture NO GROWTH 5 DAYS  Final   Report  Status 12/03/2016 FINAL  Final  Respiratory Panel by PCR     Status: None   Collection Time: 11/29/16  2:00 AM  Result Value Ref Range Status   Adenovirus NOT DETECTED NOT DETECTED Final   Coronavirus 229E NOT DETECTED NOT DETECTED Final   Coronavirus HKU1 NOT DETECTED NOT DETECTED Final   Coronavirus NL63 NOT DETECTED NOT DETECTED Final   Coronavirus OC43 NOT DETECTED NOT DETECTED Final   Metapneumovirus NOT DETECTED NOT DETECTED Final   Rhinovirus / Enterovirus NOT DETECTED NOT DETECTED Final   Influenza A NOT DETECTED NOT DETECTED Final   Influenza B NOT DETECTED NOT DETECTED Final   Parainfluenza Virus 1 NOT DETECTED NOT DETECTED Final   Parainfluenza Virus 2 NOT DETECTED NOT DETECTED Final   Parainfluenza Virus 3 NOT DETECTED NOT DETECTED Final   Parainfluenza Virus 4 NOT DETECTED NOT DETECTED Final   Respiratory Syncytial Virus NOT DETECTED NOT DETECTED Final   Bordetella pertussis NOT DETECTED NOT DETECTED Final   Chlamydophila pneumoniae NOT DETECTED NOT DETECTED Final   Mycoplasma pneumoniae NOT DETECTED NOT DETECTED Final  Urine culture     Status: None   Collection Time: 11/29/16  3:31 AM  Result Value Ref Range Status   Specimen Description URINE, RANDOM  Final   Special Requests NONE  Final   Culture NO GROWTH  Final   Report Status 11/30/2016 FINAL  Final  MRSA PCR Screening     Status: None   Collection Time: 11/29/16  3:31 AM  Result Value Ref Range Status   MRSA by PCR NEGATIVE NEGATIVE Final    Comment:        The GeneXpert MRSA Assay (FDA approved for NASAL specimens only), is one component of a comprehensive MRSA colonization surveillance program. It is not intended to diagnose MRSA infection nor to guide or monitor treatment for MRSA infections.      Scheduled Meds: . apixaban  5 mg Oral BID  . atorvastatin  20 mg Oral Daily  . chlorhexidine  15 mL Mouth Rinse BID  . insulin aspart  0-9 Units Subcutaneous TID WC  . ipratropium-albuterol  3 mL  Nebulization Q6H  . loratadine  10 mg Oral Daily  . LORazepam  1 mg Oral BID  . mouth rinse  15 mL Mouth Rinse q12n4p  . methylPREDNISolone (SOLU-MEDROL) injection  40 mg Intravenous Q24H  . montelukast  10 mg Oral QHS  . multivitamin with minerals  1 tablet Oral BID  . pantoprazole  40 mg Oral Daily     LOS: 8 days   Cherene Altes, MD [facility] Office  [phone] Pager - Text Page per Amion as per below:  On-Call/Text Page:      [email or web address]      password [email or web address]  If 7PM-7AM, please contact night-coverage [email or web address] Password [email or web address] 12/06/2016, 10:59 AM

## 2016-12-06 NOTE — Evaluation (Signed)
Occupational Therapy Evaluation Patient Details Name: [name] MRN: [id number] DOB: 08/21/1931 Today's Date: 12/06/2016    History of Present Illness Patient is an 83 y/o female who presents with cough, SOB and diarrhea. CXR small opacification at Right base. Found to be in septic shock with concerns for aspiration PNA. PMH includes chronic respiratory failure on 2 L oxygen, COPD, asthma, HLD, stroke, GERD, former smoker, [name], s/p of placement, [name], [name], [name], [name], [name].   Clinical Impression   PTA, pt reports independence with assistive devices for ADL and functional mobility. She currently requires min assist to thread feet into LB clothing with mod assist for sit<>stand to pull pants up and min assist for UB ADL. Unable to safely pivot this session for simulated toilet transfer but was able to complete 2 sit<>stands as a preparatory activity to ADL transfers with mod assist. Pt presents with decreased activity tolerance for ADL, decreased B UE strength, and decreased cognition impacting her ability to participate in ADL at [facility]. She would benefit from continued OT services while admitted to improve independence and safety with ADL. Recommend short-term SNF placement for continued rehabilitation post-acute D/C. Will continue to follow while admitted.    Follow Up Recommendations  SNF;Supervision/Assistance - 24 hour    Equipment Recommendations  Other (comment) (TBD at next venue of care)    Recommendations for Other Services       Precautions / Restrictions Precautions Precautions: Fall Precaution Comments: watch 02 Restrictions Weight Bearing Restrictions: No      Mobility Bed Mobility Overal bed mobility: Needs Assistance Bed Mobility: Supine to Sit;Sit to Supine     Supine to sit: HOB elevated;Mod assist Sit to supine: Mod assist   General bed mobility comments: Mod assist for lifting trunk from bed.    Transfers Overall transfer level: Needs assistance Equipment used: 2 person hand held assist Transfers: Sit to/from [facility] Sit to Stand: Mod assist         General transfer comment: Mod lifting assist to power into standing.     Balance Overall balance assessment: Needs assistance Sitting-balance support: Feet supported;No upper extremity supported Sitting balance-Leahy Scale: Good     Standing balance support: During functional activity;Bilateral upper extremity supported Standing balance-Leahy Scale: Poor Standing balance comment: UE support and external assistance.                            ADL either performed or assessed with clinical judgement   ADL Overall ADL's : Needs assistance/impaired Eating/Feeding: Supervision/ safety;Sitting   Grooming: Supervision/safety;Set up;Sitting   Upper Body Bathing: Minimal assistance;Sitting   Lower Body Bathing: Minimal assistance Lower Body Bathing Details (indicate cue type and reason): Mod assist for sit<>stand Upper Body Dressing : Minimal assistance;Sitting   Lower Body Dressing: Minimal assistance Lower Body Dressing Details (indicate cue type and reason): Mod assist for sit<>stand               General ADL Comments: Able to complete sit<>stand with mod assist for approximately 1 minute for 2 repetitions. Unsafe to progress to stand-pivot.      Vision   Vision Assessment?: No apparent visual deficits     Perception     Praxis      Pertinent Vitals/Pain Pain Assessment: Faces Faces Pain Scale: Hurts little more Pain Location: Pinching at tape around midline single lumen (R brachial) with arm movement.  Pain Descriptors /  Indicators: Sharp;Discomfort Pain Intervention(s): Limited activity within patient's tolerance;Monitored during session;Repositioned     Hand Dominance Right   Extremity/Trunk Assessment Upper Extremity Assessment Upper Extremity Assessment:  Generalized weakness   Lower Extremity Assessment Lower Extremity Assessment: Generalized weakness       Communication Communication Communication: No difficulties   Cognition Arousal/Alertness: Awake/alert Behavior During Therapy: WFL for tasks assessed/performed Overall Cognitive Status: Impaired/Different from baseline Area of Impairment: Safety/judgement;Awareness;Following commands;Memory;Problem solving;Orientation                 Orientation Level: Disoriented to;Time;Situation   Memory: Decreased short-term memory Following Commands: Follows multi-step commands inconsistently Safety/Judgement: Decreased awareness of safety;Decreased awareness of deficits Awareness: Intellectual Problem Solving: Slow processing General Comments: Pt alert but unable to report the date. Reports it is hard for her to understand that this is a hospital because it looks like a "filling station."   General Comments  On 15 L O2 via HFNC throughout session. SpO2 ranging from 87%-100%. Improved when seated at EOB and upright. Requires cues for pursed lip breathing techniques.     Exercises     Shoulder Instructions      Home Living Family/patient expects to be discharged to:: Assisted living ([facility])                             Home Equipment: Gilford Rile - 2 wheels;Walker - 4 wheels;Cane - single point;Bedside commode;Shower seat   Additional Comments: question reliability of report due to confusion.      Prior Functioning/Environment Level of Independence: Needs assistance  Gait / Transfers Assistance Needed: Reporting use of RW at times to OT but independent with ambulation to PT. ADL's / Homemaking Assistance Needed: Per PT note, gets assist with ADls/IADLs from a nurse who comes daily. Does not drive.            OT Problem List: Decreased strength;Decreased activity tolerance;Impaired balance (sitting and/or standing);Decreased safety  awareness;Decreased knowledge of use of DME or AE;Decreased knowledge of precautions;Cardiopulmonary status limiting activity      OT Treatment/Interventions: Self-care/ADL training;Therapeutic exercise;Energy conservation;DME and/or AE instruction;Therapeutic activities;Patient/family education;Balance training    OT Goals(Current goals can be found in the care plan section) Acute Rehab OT Goals Patient Stated Goal: to be able to breathe better OT Goal Formulation: With patient Time For Goal Achievement: 12/20/16 Potential to Achieve Goals: Good ADL Goals Pt Will Perform Grooming: standing;with supervision Pt Will Perform Lower Body Dressing: with min guard assist;sit to/from stand Pt Will Transfer to Toilet: with min assist;ambulating;bedside commode (BSC over toilet) Pt Will Perform Toileting - Clothing Manipulation and hygiene: with min guard assist;sit to/from stand Additional ADL Goal #1: Pt will demonstrate pursed lip breathing techniques during standing grooming tasks with no more than 1 VC.  OT Frequency: Min 2X/week   Barriers to D/C:            Co-evaluation              AM-PAC PT "6 Clicks" Daily Activity     Outcome Measure Help from another person eating meals?: A Little Help from another person taking care of personal grooming?: A Little Help from another person toileting, which includes using toliet, bedpan, or urinal?: A Lot Help from another person bathing (including washing, rinsing, drying)?: A Lot Help from another person to put on and taking off regular upper body clothing?: A Little Help from another person to put on and taking off  regular lower body clothing?: A Lot 6 Click Score: 15   End of Session Equipment Utilized During Treatment: Gait belt Nurse Communication: Mobility status  Activity Tolerance: Patient tolerated treatment well Patient left: in bed;with call bell/phone within reach  OT Visit Diagnosis: Other abnormalities of gait and  mobility (R26.89);Muscle weakness (generalized) (M62.81)                Time: 8208-1388 OT Time Calculation (min): 23 min Charges:  OT General Charges $OT Visit: 1 Visit OT Evaluation $OT Eval Moderate Complexity: 1 Mod OT Treatments $Self Care/Home Management : 8-22 mins G-Codes:     Norman Herrlich, MS OTR/L  Pager: [facility] 12/06/2016, 1:17 PM

## 2016-12-07 ENCOUNTER — Ambulatory Visit: Payer: Medicare Other | Admitting: Internal Medicine

## 2016-12-07 LAB — BASIC METABOLIC PANEL
Anion gap: 9 (ref 5–15)
BUN: 28 mg/dL — AB (ref 6–20)
CHLORIDE: 102 mmol/L (ref 101–111)
CO2: 30 mmol/L (ref 22–32)
CREATININE: 1.27 mg/dL — AB (ref 0.44–1.00)
Calcium: 9.6 mg/dL (ref 8.9–10.3)
GFR calc Af Amer: 44 mL/min — ABNORMAL LOW (ref >60)
GFR calc non Af Amer: 38 mL/min — ABNORMAL LOW (ref >60)
GLUCOSE: 178 mg/dL — AB (ref 65–99)
POTASSIUM: 4.6 mmol/L (ref 3.5–5.1)
Sodium: 141 mmol/L (ref 135–145)

## 2016-12-07 LAB — GLUCOSE, CAPILLARY
GLUCOSE-CAPILLARY: 135 mg/dL — AB (ref 65–99)
GLUCOSE-CAPILLARY: 151 mg/dL — AB (ref 65–99)
Glucose-Capillary: 118 mg/dL — ABNORMAL HIGH (ref 65–99)
Glucose-Capillary: 162 mg/dL — ABNORMAL HIGH (ref 65–99)

## 2016-12-07 MED ORDER — PREDNISONE 20 MG PO TABS
20.0000 mg | ORAL_TABLET | Freq: Every day | ORAL | Status: DC
  Administered 2016-12-08 – 2016-12-11 (×2): 20 mg via ORAL
  Filled 2016-12-07 (×2): qty 1

## 2016-12-07 MED ORDER — FUROSEMIDE 40 MG PO TABS
40.0000 mg | ORAL_TABLET | Freq: Every day | ORAL | Status: DC
  Administered 2016-12-07 – 2016-12-08 (×2): 40 mg via ORAL
  Filled 2016-12-07 (×2): qty 1

## 2016-12-07 MED ORDER — SENNOSIDES-DOCUSATE SODIUM 8.6-50 MG PO TABS
1.0000 | ORAL_TABLET | Freq: Two times a day (BID) | ORAL | Status: DC
  Administered 2016-12-07 – 2016-12-11 (×6): 1 via ORAL
  Filled 2016-12-07 (×6): qty 1

## 2016-12-07 MED ORDER — LORAZEPAM 0.5 MG PO TABS
0.5000 mg | ORAL_TABLET | Freq: Two times a day (BID) | ORAL | Status: DC
  Administered 2016-12-07: 0.5 mg via ORAL
  Filled 2016-12-07 (×2): qty 1

## 2016-12-07 MED ORDER — LORAZEPAM 2 MG/ML IJ SOLN
0.5000 mg | INTRAMUSCULAR | Status: DC | PRN
  Administered 2016-12-09 (×2): 0.5 mg via INTRAVENOUS
  Filled 2016-12-07 (×2): qty 1

## 2016-12-07 MED ORDER — POLYETHYLENE GLYCOL 3350 17 G PO PACK
17.0000 g | PACK | Freq: Two times a day (BID) | ORAL | Status: DC
  Administered 2016-12-07 – 2016-12-11 (×5): 17 g via ORAL
  Filled 2016-12-07 (×5): qty 1

## 2016-12-07 MED ORDER — LEVALBUTEROL HCL 0.63 MG/3ML IN NEBU
0.6300 mg | INHALATION_SOLUTION | Freq: Four times a day (QID) | RESPIRATORY_TRACT | Status: DC
  Administered 2016-12-07 – 2016-12-09 (×7): 0.63 mg via RESPIRATORY_TRACT
  Filled 2016-12-07 (×7): qty 3

## 2016-12-07 NOTE — Clinical Social Work Note (Signed)
CSW continues to follow for discharge needs. Patient still on HFNC.  [name], [name]

## 2016-12-07 NOTE — Progress Notes (Signed)
Physical Therapy Treatment Patient Details Name: [name] MRN: [id number] DOB: 1932-01-29 Today's Date: 12/07/2016    History of Present Illness Patient is an 83 y/o female who presents with cough, SOB and diarrhea. CXR small opacification at Right base. Found to be in septic shock with concerns for aspiration PNA. PMH includes chronic respiratory failure on 2 L oxygen, COPD, asthma, HLD, stroke, GERD, former smoker, [name], s/p of placement, [name], [name], [name], [name], [name].    PT Comments    Pt admitted with above diagnosis. Pt currently with functional limitations due to balance and endurance deficits. Pt was able to stand and pivot with mod assist to chair. Desats but did recover with breaks.  On less O2 today at [facility].  Will continue to progress as pt able.  Pt will benefit from skilled PT to increase their independence and safety with mobility to allow discharge to the venue listed below.     Follow Up Recommendations  SNF     Equipment Recommendations  None recommended by PT    Recommendations for Other Services OT consult     Precautions / Restrictions Precautions Precautions: Fall Precaution Comments: watch 02 Restrictions Weight Bearing Restrictions: No    Mobility  Bed Mobility Overal bed mobility: Needs Assistance Bed Mobility: Supine to Sit     Supine to sit: HOB elevated;Mod assist     General bed mobility comments: Mod assist for lifting trunk from bed.   Transfers Overall transfer level: Needs assistance Equipment used: 2 person hand held assist Transfers: Sit to/from [facility] Sit to Stand: Mod assist Stand pivot transfers: Mod assist;Min assist       General transfer comment: Mod lifting assist to power into standing. min to mod assist for pt to pivot around needing assist to weight shift.    Ambulation/Gait                 Stairs            Wheelchair Mobility     Modified Rankin (Stroke Patients Only)       Balance Overall balance assessment: Needs assistance Sitting-balance support: Feet supported;No upper extremity supported Sitting balance-Leahy Scale: Good Sitting balance - Comments: Able to perform there ex sittin EOB without LOB.   Standing balance support: During functional activity;Bilateral upper extremity supported Standing balance-Leahy Scale: Poor Standing balance comment: UE support and external assistance.                             Cognition Arousal/Alertness: Awake/alert Behavior During Therapy: WFL for tasks assessed/performed Overall Cognitive Status: Impaired/Different from baseline Area of Impairment: Safety/judgement;Awareness;Following commands;Memory;Problem solving;Orientation                 Orientation Level: Disoriented to;Time;Situation   Memory: Decreased short-term memory Following Commands: Follows multi-step commands inconsistently Safety/Judgement: Decreased awareness of safety;Decreased awareness of deficits Awareness: Intellectual Problem Solving: Slow processing        Exercises General Exercises - Lower Extremity Ankle Circles/Pumps: AROM;Both;Supine;10 reps Quad Sets: AROM;Both;10 reps;Supine Long Arc Quad: AROM;Both;15 reps;Seated Heel Slides: AROM;Both;5 reps;Supine Hip ABduction/ADduction: AROM;Both;Supine;5 reps Hip Flexion/Marching: AROM;Both;10 reps;Seated    General Comments General comments (skin integrity, edema, etc.): On 8LHFNC.  O2 >90% until pt in chair.  DRopped to 84-87% for 30 seconds and then returned to >90%.  Once settled in chair, 95%.  Other VSS      Pertinent Vitals/Pain  Pain Assessment: No/denies pain    Home Living                      Prior Function            PT Goals (current goals can now be found in the care plan section) Progress towards PT goals: Progressing toward goals    Frequency    Min 3X/week      PT Plan  Current plan remains appropriate    Co-evaluation              AM-PAC PT "6 Clicks" Daily Activity  Outcome Measure  Difficulty turning over in bed (including adjusting bedclothes, sheets and blankets)?: Unable Difficulty moving from lying on back to sitting on the side of the bed? : Unable Difficulty sitting down on and standing up from a chair with arms (e.g., wheelchair, bedside commode, etc,.)?: A Lot Help needed moving to and from a bed to chair (including a wheelchair)?: A Lot Help needed walking in hospital room?: Total Help needed climbing 3-5 steps with a railing? : Total 6 Click Score: 8    End of Session Equipment Utilized During Treatment: Oxygen;Gait belt Activity Tolerance: Patient limited by fatigue Patient left: with call bell/phone within reach;in chair Nurse Communication: Mobility status;Need for lift equipment (May need STedy to get pt back to bed.) PT Visit Diagnosis: Muscle weakness (generalized) (M62.81);Other (comment);Unsteadiness on feet (R26.81) (low SpO2)     Time: 3825-0539 PT Time Calculation (min) (ACUTE ONLY): 17 min  Charges:  $Therapeutic Activity: 8-22 mins                    G Codes:       Margretta Zamorano,PT Acute Rehabilitation [phone] (pager)    Denice Paradise 12/07/2016, 11:50 AM

## 2016-12-07 NOTE — Progress Notes (Signed)
[place] TEAM 1 - Stepdown/ICU TEAM  [name]  TGG:[id number] DOB: 10-16-1931 DOA: 11/28/2016 PCP: Hoyt Koch, MD    Brief Narrative:  83yo F with Hx of COPD on home O2 at 2L, HLD, CVA, GERD, [name], Breast CA, CHF, CKD stage 3, [name], and Afib on Eliquis who presented with cough, shortness of breath, and diarrhea. ED w/u noted elevated WBC and lactate w/ CXR revealing a small opacification at the R base. She remained hypotensive despite 3.5L IVF and was therefore started on Phenylephrine gtt.    Significant Events: 9/22 admit to [facility] 9/23 PCCM assumed care  9/26 TRH assumed care   Subjective: The patient is alert and conversant this morning though somewhat confused.  She is in no acute respiratory distress.  She denies shortness of breath chest pain nausea or vomiting.  She does complain of constipation.  Assessment & Plan:  Acute hypoxic respiratory failure on chronic hypoxic resp failure (2L [place] O2 at baseline) BIPAP prn during day and mandatory each night - multifactorial (see issues below) - wean O2 as able - appears to be making slow progress  Aspiration PNA Has completed 8 days of Zosyn - sepsis resolved - cont to follow SLP recs on intake - CXR 9/30 confirms improvement   COPD on Home oxygen at 2L no wheezing presently - followed by Dr. Learta Codding as outpt - resumed home med tx   OSA CPAP QHS and for prolonged naps - explained importance of this to her at length, and yet she frequently refuses to comply - compliance long term is highly unlikely  Chronic Diastolic CHF - Acute Pulm edema TTE 08/2016 noted EF 55% - hx of Takatsubo with EF recovery - baseline wgt appears to be ~71-72kg - net negative ~8.5L - crt has stabilized w/ holding of crt - resume at lower maintenance dose w/ goal to keep weight at [facility] Weights   12/05/16 0449 12/06/16 0356 12/07/16 0239  Weight: 68 kg (149 lb 14.6 oz) 66.7 kg (147 lb 0.8 oz) 70.4 kg (155 lb 3.3 oz)    SA node  dysfunction s/p pacer   Chronic Afib on anticoagulation with Eliquis CHA2DS2-VASc Score is 7 - rate controlled   Acute Kidney Injury crt had normalized, but began to climb again w/ diuresis - crt has stabilized w/ holding of lasix - as discussed above resume maintenance dose diuretic today and follow   Recent Labs Lab 12/02/16 0301 12/03/16 0223 12/05/16 0307 12/06/16 0238 12/07/16 0214  CREATININE 0.87 1.05* 1.33* 1.42* 1.27*   Hypokalemia Due to high dose diuretic - corrected     DM2 CBG reasonably controlled at this time   Dysphagia  SLP following - modified diet as per SLP recs   HLD  DVT prophylaxis: Eliquis  Code Status: FULL CODE Family Communication: no family present at time of exam today  Disposition Plan: SDU due to high O2 needs - cont PT/OT - up to chair - incentive spirometer - mobilize - d/c foley   Consultants:  PCCM  Antimicrobials:  Zosyn 9/22 > 9/29  Objective: Blood pressure 127/69, pulse 78, temperature 98 F (36.7 C), temperature source Oral, resp. rate 19, height 5\' 6"  (1.676 m), weight 70.4 kg (155 lb 3.3 oz), SpO2 96 %.  Intake/Output Summary (Last 24 hours) at 12/07/16 0901 Last data filed at 12/07/16 0805  Gross per 24 hour  Intake              530 ml  Output              750 ml  Net             -220 ml   Filed Weights   12/05/16 0449 12/06/16 0356 12/07/16 0239  Weight: 68 kg (149 lb 14.6 oz) 66.7 kg (147 lb 0.8 oz) 70.4 kg (155 lb 3.3 oz)    Examination: General: no acute respiratory distress - more alert  Lungs: clear to auscultation throughout with no wheezing or focal crackles Cardiovascular: RRR  Abdomen: NT/ND, soft, BS+, no rebound  Extremities: no signif edema bilateral LE   CBC:  Recent Labs Lab 12/01/16 0258 12/02/16 0301 12/03/16 0223 12/05/16 0307  WBC 14.6* 12.6* 14.0* 13.7*  HGB 10.9* 11.6* 11.5* 13.3  HCT 34.0* 35.6* 35.8* 40.8  MCV 85.6 84.4 84.8 85.2  PLT 246 277 310 893   Basic Metabolic  Panel:  Recent Labs Lab 12/01/16 0258 12/01/16 1817 12/02/16 0301 12/03/16 0223 12/03/16 1229 12/05/16 0307 12/06/16 0238 12/07/16 0214  NA 142 141 140 143  --  145 143 141  K 3.5 3.4* 3.4* 3.1* 4.2 2.9* 3.8 4.6  CL 110 106 103 103  --  100* 99* 102  CO2 24 24 24 27   --  30 32 30  GLUCOSE 108* 150* 141* 150*  --  220* 216* 178*  BUN 13 11 9 19   --  26* 26* 28*  CREATININE 1.01* 0.85 0.87 1.05*  --  1.33* 1.42* 1.27*  CALCIUM 8.2* 8.4* 8.3* 9.2  --  9.7 9.6 9.6  MG 1.6*  --  2.0  --  2.2  --   --   --   PHOS 1.1* 3.6 2.9  --  2.6  --   --   --    GFR: Estimated Creatinine Clearance: 31.4 mL/min (A) (by C-G formula based on SCr of 1.27 mg/dL (H)).  Liver Function Tests:  Recent Labs Lab 12/03/16 0223 12/05/16 0307  AST 15 20  ALT 10* 12*  ALKPHOS 67 67  BILITOT 1.0 0.9  PROT 7.6 7.9  ALBUMIN 2.5* 2.9*    HbA1C: Hgb A1c MFr Bld  Date/Time Value Ref Range Status  04/26/2016 03:10 AM 7.0 (H) 4.8 - 5.6 % Final    Comment:    (NOTE)         Pre-diabetes: 5.7 - 6.4         Diabetes: >6.4         Glycemic control for adults with diabetes: <7.0   11/22/2015 01:57 PM 7.1 (H) 4.6 - 6.5 % Final    Comment:    Glycemic Control Guidelines for People with Diabetes:Non Diabetic:  <6%Goal of Therapy: <7%Additional Action Suggested:  >8%     CBG:  Recent Labs Lab 12/06/16 0813 12/06/16 1213 12/06/16 1618 12/06/16 2109 12/07/16 0800  GLUCAP 151* 111* 216* 181* 118*    Recent Results (from the past 240 hour(s))  Blood culture (routine x 2)     Status: None   Collection Time: 11/28/16  6:40 PM  Result Value Ref Range Status   Specimen Description BLOOD LEFT ANTECUBITAL  Final   Special Requests   Final    BOTTLES DRAWN AEROBIC AND ANAEROBIC Blood Culture adequate volume   Culture NO GROWTH 5 DAYS  Final   Report Status 12/03/2016 FINAL  Final  Blood culture (routine x 2)     Status: None   Collection Time: 11/28/16  6:44 PM  Result Value Ref Range  Status    Specimen Description BLOOD RIGHT ANTECUBITAL  Final   Special Requests   Final    BOTTLES DRAWN AEROBIC AND ANAEROBIC Blood Culture adequate volume   Culture NO GROWTH 5 DAYS  Final   Report Status 12/03/2016 FINAL  Final  Respiratory Panel by PCR     Status: None   Collection Time: 11/29/16  2:00 AM  Result Value Ref Range Status   Adenovirus NOT DETECTED NOT DETECTED Final   Coronavirus 229E NOT DETECTED NOT DETECTED Final   Coronavirus HKU1 NOT DETECTED NOT DETECTED Final   Coronavirus NL63 NOT DETECTED NOT DETECTED Final   Coronavirus OC43 NOT DETECTED NOT DETECTED Final   Metapneumovirus NOT DETECTED NOT DETECTED Final   Rhinovirus / Enterovirus NOT DETECTED NOT DETECTED Final   Influenza A NOT DETECTED NOT DETECTED Final   Influenza B NOT DETECTED NOT DETECTED Final   Parainfluenza Virus 1 NOT DETECTED NOT DETECTED Final   Parainfluenza Virus 2 NOT DETECTED NOT DETECTED Final   Parainfluenza Virus 3 NOT DETECTED NOT DETECTED Final   Parainfluenza Virus 4 NOT DETECTED NOT DETECTED Final   Respiratory Syncytial Virus NOT DETECTED NOT DETECTED Final   Bordetella pertussis NOT DETECTED NOT DETECTED Final   Chlamydophila pneumoniae NOT DETECTED NOT DETECTED Final   Mycoplasma pneumoniae NOT DETECTED NOT DETECTED Final  Urine culture     Status: None   Collection Time: 11/29/16  3:31 AM  Result Value Ref Range Status   Specimen Description URINE, RANDOM  Final   Special Requests NONE  Final   Culture NO GROWTH  Final   Report Status 11/30/2016 FINAL  Final  MRSA PCR Screening     Status: None   Collection Time: 11/29/16  3:31 AM  Result Value Ref Range Status   MRSA by PCR NEGATIVE NEGATIVE Final    Comment:        The GeneXpert MRSA Assay (FDA approved for NASAL specimens only), is one component of a comprehensive MRSA colonization surveillance program. It is not intended to diagnose MRSA infection nor to guide or monitor treatment for MRSA infections.      Scheduled  Meds: . apixaban  5 mg Oral BID  . atorvastatin  20 mg Oral Daily  . chlorhexidine  15 mL Mouth Rinse BID  . insulin aspart  0-9 Units Subcutaneous TID WC  . levalbuterol  0.63 mg Nebulization Q6H  . loratadine  10 mg Oral Daily  . LORazepam  1 mg Oral BID  . mouth rinse  15 mL Mouth Rinse q12n4p  . methylPREDNISolone (SOLU-MEDROL) injection  40 mg Intravenous Q24H  . mometasone-formoterol  2 puff Inhalation BID  . montelukast  10 mg Oral QHS  . multivitamin with minerals  1 tablet Oral BID  . tiotropium  18 mcg Inhalation Daily     LOS: 9 days   Cherene Altes, MD [facility] Office  [phone] Pager - Text Page per Amion as per below:  On-Call/Text Page:      [email or web address]      password [email or web address]  If 7PM-7AM, please contact night-coverage [email or web address] Password [email or web address] 12/07/2016, 9:01 AM

## 2016-12-07 NOTE — Progress Notes (Signed)
  Speech Language Pathology Treatment: Dysphagia  Patient Details Name: [name] MRN: [id number] DOB: 03/14/1931 Today's Date: 12/07/2016 Time: 5183-4373 SLP Time Calculation (min) (ACUTE ONLY): 19 min  Assessment / Plan / Recommendation Clinical Impression  Pt alert, pleasant and observed with upgraded (9/28) nectar consistency and Dys 2 without indications of airway compromise. Mild anterior spill (thin) without oral residue following egg bolus. Pt requesting water and given cup sip to diagnostic treatment resulting in immediate cough. SLP provided clinical reasoning and purpose of thickener. Recommend continue Dys 2, nectar thick liquids and continue ST.   HPI HPI: [name], COPD, asthma, hyperlipidemia, stroke, GERD, formersmoker, [name], s/p ofplacement, [name], [name], [name], [name], [name], who presented with cough, shortness breath, and diarrhea. Pt with acute exacerbation of COPD and CT Chest suggests RLL PNA. Patient is known to [place] with previous MBS dated 04/27/2016 which did show penetration/aspiration given large or serial sips of thin liquids.      SLP Plan  Continue with current plan of care       Recommendations  Diet recommendations: Dysphagia 2 (fine chop);Nectar-thick liquid Liquids provided via: Cup;No straw Medication Administration: Whole meds with puree Supervision: Patient able to self feed;Full supervision/cueing for compensatory strategies Compensations: Minimize environmental distractions;Slow rate;Small sips/bites Postural Changes and/or Swallow Maneuvers: Seated upright 90 degrees                Oral Care Recommendations: Oral care BID Follow up Recommendations:  (TBD) SLP Visit Diagnosis: Dysphagia, oropharyngeal phase (R13.12) Plan: Continue with current plan of care       GO                [name] 12/07/2016, 9:19 AM  [name].[employer] [phone]

## 2016-12-07 NOTE — Telephone Encounter (Signed)
Spoke with patient's daughter the same day she made this call. Discussed concerns.

## 2016-12-08 LAB — BASIC METABOLIC PANEL
Anion gap: 10 (ref 5–15)
BUN: 23 mg/dL — ABNORMAL HIGH (ref 6–20)
CHLORIDE: 100 mmol/L — AB (ref 101–111)
CO2: 29 mmol/L (ref 22–32)
Calcium: 9.2 mg/dL (ref 8.9–10.3)
Creatinine, Ser: 1.1 mg/dL — ABNORMAL HIGH (ref 0.44–1.00)
GFR calc non Af Amer: 45 mL/min — ABNORMAL LOW (ref >60)
GFR, EST AFRICAN AMERICAN: 52 mL/min — AB (ref >60)
Glucose, Bld: 153 mg/dL — ABNORMAL HIGH (ref 65–99)
POTASSIUM: 3.4 mmol/L — AB (ref 3.5–5.1)
SODIUM: 139 mmol/L (ref 135–145)

## 2016-12-08 LAB — GLUCOSE, CAPILLARY
GLUCOSE-CAPILLARY: 141 mg/dL — AB (ref 65–99)
GLUCOSE-CAPILLARY: 175 mg/dL — AB (ref 65–99)
Glucose-Capillary: 197 mg/dL — ABNORMAL HIGH (ref 65–99)
Glucose-Capillary: 251 mg/dL — ABNORMAL HIGH (ref 65–99)

## 2016-12-08 MED ORDER — POTASSIUM CHLORIDE CRYS ER 20 MEQ PO TBCR
40.0000 meq | EXTENDED_RELEASE_TABLET | Freq: Once | ORAL | Status: AC
  Administered 2016-12-08: 40 meq via ORAL
  Filled 2016-12-08: qty 2

## 2016-12-08 NOTE — Progress Notes (Signed)
[place] TEAM 1 - Stepdown/ICU TEAM  LALITA EBEL  WGN:[id number] DOB: 1931/05/14 DOA: 11/28/2016 PCP: Hoyt Koch, MD    Brief Narrative:  83yo F with Hx of COPD on home O2 at 2L, HLD, CVA, GERD, [name], Breast CA, CHF, CKD stage 3, [name], and Afib on Eliquis who presented with cough, shortness of breath, and diarrhea. ED w/u noted elevated WBC and lactate w/ CXR revealing a small opacification at the R base. She remained hypotensive despite 3.5L IVF and was therefore started on Phenylephrine gtt.    Significant Events: 9/22 admit to [facility] 9/23 PCCM assumed care  9/26 TRH assumed care   Subjective: It appears the pt is only complying w/ her BIPAP qhs in very short spells each night, if at all.  She denies sob or cp this morning, but c/o dyspepsia.  She is mildly confused but alert and conversant.  She does not appear to be in acute distress at this time.    Assessment & Plan:  Goals of Care pt is making only very slow progress, and this is being greatly limited by her cognitive impairment and noncompliance - her likelihood of recurrent decline is high - we may be approaching end stage in her lung disease process - will consult Palliative Care to assist in establishing clear tx goals and plan for future care/disposition   Acute hypoxic respiratory failure on chronic hypoxic resp failure (2L [place] O2 at baseline) BIPAP prn during day and mandatory each night - multifactorial (see issues below) - wean O2 as able - is only making very slow progress - must mobilize to recruit as many atelectatic alveoli as possible   Aspiration PNA Has completed 8 days of Zosyn - sepsis resolved - cont to follow SLP recs on intake - CXR 9/30 confirms improvement   COPD on Home oxygen at 2L no wheezing presently - followed by Dr. Learta Codding as outpt - resumed home med tx   OSA BIPAP or CPAP QHS and for prolonged naps - explained importance of this to her at length, and yet she frequently refuses to comply -  compliance long term is highly unlikely and I suspect this is contributing to her lack of signif progress  Chronic Diastolic CHF - Acute Pulm edema TTE 08/2016 noted EF 55% - hx of Takatsubo with EF recovery - baseline wgt appears to be ~71-72kg - net negative ~8.3L - crt has stabilized w/ holding of crt - resume at lower maintenance dose w/ goal to keep weight at [facility] Weights   12/06/16 0356 12/07/16 0239 12/08/16 0455  Weight: 66.7 kg (147 lb 0.8 oz) 70.4 kg (155 lb 3.3 oz) 68.6 kg (151 lb 3.8 oz)    SA node dysfunction s/p pacer   Chronic Afib on anticoagulation with Eliquis CHA2DS2-VASc Score is 7 - rate controlled   Acute Kidney Injury crt had normalized, but began to climb again w/ diuresis - crt stabilized w/ holding of lasix - cont to follow trend w/ pt back on maintenance dose diuretic   Recent Labs Lab 12/03/16 0223 12/05/16 0307 12/06/16 0238 12/07/16 0214 12/08/16 0131  CREATININE 1.05* 1.33* 1.42* 1.27* 1.10*   Hypokalemia Due to diuretic and poor intake - cont to supplement    DM2 CBG reasonably controlled at this time   Dysphagia  SLP following - modified diet as per SLP recs   HLD  DVT prophylaxis: Eliquis  Code Status: FULL CODE Family Communication: no family present at time of exam  today - spoke w/ daughter-in-law Dr. Fransico Him via phone 10/1 Disposition Plan: SDU due to high O2 needs - cont PT/OT - up to chair - incentive spirometer - mobilize - d/c foley - will consult Palliative Care   Consultants:  PCCM  Antimicrobials:  Zosyn 9/22 > 9/29  Objective: Blood pressure (!) 125/52, pulse 73, temperature 97.6 F (36.4 C), temperature source Oral, resp. rate 18, height 5\' 6"  (1.676 m), weight 68.6 kg (151 lb 3.8 oz), SpO2 95 %.  Intake/Output Summary (Last 24 hours) at 12/08/16 0924 Last data filed at 12/08/16 0900  Gross per 24 hour  Intake              560 ml  Output              300 ml  Net              260 ml   Filed  Weights   12/06/16 0356 12/07/16 0239 12/08/16 0455  Weight: 66.7 kg (147 lb 0.8 oz) 70.4 kg (155 lb 3.3 oz) 68.6 kg (151 lb 3.8 oz)    Examination: General: no acute respiratory distress - more alert but mildy confused  Lungs: fine diffuse crackles - no wheezing  Cardiovascular: RRR - no gallup or rub  Abdomen: NT/ND, soft, BS+, no rebound  Extremities: no edema bilateral LE   CBC:  Recent Labs Lab 12/02/16 0301 12/03/16 0223 12/05/16 0307  WBC 12.6* 14.0* 13.7*  HGB 11.6* 11.5* 13.3  HCT 35.6* 35.8* 40.8  MCV 84.4 84.8 85.2  PLT 277 310 790   Basic Metabolic Panel:  Recent Labs Lab 12/01/16 1817 12/02/16 0301 12/03/16 0223 12/03/16 1229 12/05/16 0307 12/06/16 0238 12/07/16 0214 12/08/16 0131  NA 141 140 143  --  145 143 141 139  K 3.4* 3.4* 3.1* 4.2 2.9* 3.8 4.6 3.4*  CL 106 103 103  --  100* 99* 102 100*  CO2 24 24 27   --  30 32 30 29  GLUCOSE 150* 141* 150*  --  220* 216* 178* 153*  BUN 11 9 19   --  26* 26* 28* 23*  CREATININE 0.85 0.87 1.05*  --  1.33* 1.42* 1.27* 1.10*  CALCIUM 8.4* 8.3* 9.2  --  9.7 9.6 9.6 9.2  MG  --  2.0  --  2.2  --   --   --   --   PHOS 3.6 2.9  --  2.6  --   --   --   --    GFR: Estimated Creatinine Clearance: 36.3 mL/min (A) (by C-G formula based on SCr of 1.1 mg/dL (H)).  Liver Function Tests:  Recent Labs Lab 12/03/16 0223 12/05/16 0307  AST 15 20  ALT 10* 12*  ALKPHOS 67 67  BILITOT 1.0 0.9  PROT 7.6 7.9  ALBUMIN 2.5* 2.9*    HbA1C: Hgb A1c MFr Bld  Date/Time Value Ref Range Status  04/26/2016 03:10 AM 7.0 (H) 4.8 - 5.6 % Final    Comment:    (NOTE)         Pre-diabetes: 5.7 - 6.4         Diabetes: >6.4         Glycemic control for adults with diabetes: <7.0   11/22/2015 01:57 PM 7.1 (H) 4.6 - 6.5 % Final    Comment:    Glycemic Control Guidelines for People with Diabetes:Non Diabetic:  <6%Goal of Therapy: <7%Additional Action Suggested:  >8%     CBG:  Recent  Labs Lab 12/07/16 0800 12/07/16 1206  12/07/16 1626 12/07/16 2109 12/08/16 0812  GLUCAP 118* 162* 135* 151* 141*    Recent Results (from the past 240 hour(s))  Blood culture (routine x 2)     Status: None   Collection Time: 11/28/16  6:40 PM  Result Value Ref Range Status   Specimen Description BLOOD LEFT ANTECUBITAL  Final   Special Requests   Final    BOTTLES DRAWN AEROBIC AND ANAEROBIC Blood Culture adequate volume   Culture NO GROWTH 5 DAYS  Final   Report Status 12/03/2016 FINAL  Final  Blood culture (routine x 2)     Status: None   Collection Time: 11/28/16  6:44 PM  Result Value Ref Range Status   Specimen Description BLOOD RIGHT ANTECUBITAL  Final   Special Requests   Final    BOTTLES DRAWN AEROBIC AND ANAEROBIC Blood Culture adequate volume   Culture NO GROWTH 5 DAYS  Final   Report Status 12/03/2016 FINAL  Final  Respiratory Panel by PCR     Status: None   Collection Time: 11/29/16  2:00 AM  Result Value Ref Range Status   Adenovirus NOT DETECTED NOT DETECTED Final   Coronavirus 229E NOT DETECTED NOT DETECTED Final   Coronavirus HKU1 NOT DETECTED NOT DETECTED Final   Coronavirus NL63 NOT DETECTED NOT DETECTED Final   Coronavirus OC43 NOT DETECTED NOT DETECTED Final   Metapneumovirus NOT DETECTED NOT DETECTED Final   Rhinovirus / Enterovirus NOT DETECTED NOT DETECTED Final   Influenza A NOT DETECTED NOT DETECTED Final   Influenza B NOT DETECTED NOT DETECTED Final   Parainfluenza Virus 1 NOT DETECTED NOT DETECTED Final   Parainfluenza Virus 2 NOT DETECTED NOT DETECTED Final   Parainfluenza Virus 3 NOT DETECTED NOT DETECTED Final   Parainfluenza Virus 4 NOT DETECTED NOT DETECTED Final   Respiratory Syncytial Virus NOT DETECTED NOT DETECTED Final   Bordetella pertussis NOT DETECTED NOT DETECTED Final   Chlamydophila pneumoniae NOT DETECTED NOT DETECTED Final   Mycoplasma pneumoniae NOT DETECTED NOT DETECTED Final  Urine culture     Status: None   Collection Time: 11/29/16  3:31 AM  Result Value Ref  Range Status   Specimen Description URINE, RANDOM  Final   Special Requests NONE  Final   Culture NO GROWTH  Final   Report Status 11/30/2016 FINAL  Final  MRSA PCR Screening     Status: None   Collection Time: 11/29/16  3:31 AM  Result Value Ref Range Status   MRSA by PCR NEGATIVE NEGATIVE Final    Comment:        The GeneXpert MRSA Assay (FDA approved for NASAL specimens only), is one component of a comprehensive MRSA colonization surveillance program. It is not intended to diagnose MRSA infection nor to guide or monitor treatment for MRSA infections.      Scheduled Meds: . apixaban  5 mg Oral BID  . atorvastatin  20 mg Oral Daily  . chlorhexidine  15 mL Mouth Rinse BID  . furosemide  40 mg Oral Daily  . insulin aspart  0-9 Units Subcutaneous TID WC  . levalbuterol  0.63 mg Nebulization QID  . loratadine  10 mg Oral Daily  . LORazepam  0.5 mg Oral BID  . mouth rinse  15 mL Mouth Rinse q12n4p  . mometasone-formoterol  2 puff Inhalation BID  . montelukast  10 mg Oral QHS  . multivitamin with minerals  1 tablet Oral BID  . polyethylene  glycol  17 g Oral BID  . predniSONE  20 mg Oral Q breakfast  . senna-docusate  1 tablet Oral BID  . tiotropium  18 mcg Inhalation Daily     LOS: 10 days   Cherene Altes, MD [facility] Office  [phone] Pager - Text Page per Amion as per below:  On-Call/Text Page:      [email or web address]      password [email or web address]  If 7PM-7AM, please contact night-coverage [email or web address] Password [email or web address] 12/08/2016, 9:24 AM

## 2016-12-08 NOTE — Care Management Note (Signed)
Case Management Note  Patient Details  Name: [name] MRN: [id number] Date of Birth: 23-Sep-1931  Subjective/Objective:   Pt admitted with acute on chronic resp failure                Action/Plan:   PTA from [place] but will need SNF at discharge.  Palliative Care consulted due to cognitive impairment, non compliance and lack of improvement.  CM will continue to follow for discharge needs   Expected Discharge Date:                  Expected Discharge Plan:  [place]  In-House Referral:  Clinical Social Work  Discharge planning Services  CM Consult  Post Acute Care Choice:    Choice offered to:     DME Arranged:    DME Agency:     HH Arranged:    [place] Agency:     Status of Service:     If discussed at [employer], dates discussed:    Additional Comments:  Maryclare Labrador, RN 12/08/2016, 10:09 AM

## 2016-12-08 NOTE — Progress Notes (Signed)
Desats to 80's, placed on bipap by RT. Sat 92. After few min . Pt pulled out bipap and refused  To be placed back on. HF [place] 10L   Applied desats to 80's on and off. RT placed on 100% NRM sats went up to 90's. Continue to monitor.

## 2016-12-08 NOTE — Progress Notes (Signed)
Desats to 80's on NRM. Placed on bipap. After few min, asked for bipap to be taken off despite explanation given for the needs of it but  claimed she is claustrophobic. Placed on NRM sats on 90's. Continue to monitor.

## 2016-12-09 ENCOUNTER — Inpatient Hospital Stay (HOSPITAL_COMMUNITY): Payer: Medicare Other

## 2016-12-09 DIAGNOSIS — Z515 Encounter for palliative care: Secondary | ICD-10-CM

## 2016-12-09 DIAGNOSIS — Z7189 Other specified counseling: Secondary | ICD-10-CM

## 2016-12-09 DIAGNOSIS — R451 Restlessness and agitation: Secondary | ICD-10-CM

## 2016-12-09 LAB — BLOOD GAS, ARTERIAL
ACID-BASE EXCESS: 5.2 mmol/L — AB (ref 0.0–2.0)
BICARBONATE: 29.2 mmol/L — AB (ref 20.0–28.0)
DELIVERY SYSTEMS: POSITIVE
Drawn by: 105521
Expiratory PAP: 8
FIO2: 100
INSPIRATORY PAP: 14
LHR: 8 {breaths}/min
MODE: POSITIVE
O2 Saturation: 98.8 %
Patient temperature: 98.6
pCO2 arterial: 42.2 mmHg (ref 32.0–48.0)
pH, Arterial: 7.454 — ABNORMAL HIGH (ref 7.350–7.450)
pO2, Arterial: 167 mmHg — ABNORMAL HIGH (ref 83.0–108.0)

## 2016-12-09 LAB — GLUCOSE, CAPILLARY
GLUCOSE-CAPILLARY: 98 mg/dL (ref 65–99)
GLUCOSE-CAPILLARY: 94 mg/dL (ref 65–99)
Glucose-Capillary: 136 mg/dL — ABNORMAL HIGH (ref 65–99)
Glucose-Capillary: 107 mg/dL — ABNORMAL HIGH (ref 65–99)

## 2016-12-09 LAB — BASIC METABOLIC PANEL
ANION GAP: 9 (ref 5–15)
BUN: 21 mg/dL — ABNORMAL HIGH (ref 6–20)
CALCIUM: 9.2 mg/dL (ref 8.9–10.3)
CHLORIDE: 99 mmol/L — AB (ref 101–111)
CO2: 29 mmol/L (ref 22–32)
Creatinine, Ser: 1.2 mg/dL — ABNORMAL HIGH (ref 0.44–1.00)
GFR calc Af Amer: 47 mL/min — ABNORMAL LOW (ref >60)
GFR calc non Af Amer: 41 mL/min — ABNORMAL LOW (ref >60)
GLUCOSE: 140 mg/dL — AB (ref 65–99)
Potassium: 3.8 mmol/L (ref 3.5–5.1)
Sodium: 137 mmol/L (ref 135–145)

## 2016-12-09 MED ORDER — LORAZEPAM 2 MG/ML IJ SOLN
INTRAMUSCULAR | Status: AC
  Filled 2016-12-09: qty 1

## 2016-12-09 MED ORDER — HALOPERIDOL LACTATE 5 MG/ML IJ SOLN
1.0000 mg | Freq: Four times a day (QID) | INTRAMUSCULAR | Status: DC
  Administered 2016-12-09 (×2): 1 mg via INTRAVENOUS
  Filled 2016-12-09 (×2): qty 1

## 2016-12-09 MED ORDER — LORAZEPAM 2 MG/ML IJ SOLN
2.0000 mg | Freq: Once | INTRAMUSCULAR | Status: AC
  Administered 2016-12-09: 2 mg via INTRAVENOUS

## 2016-12-09 MED ORDER — LEVALBUTEROL HCL 0.63 MG/3ML IN NEBU
0.6300 mg | INHALATION_SOLUTION | Freq: Three times a day (TID) | RESPIRATORY_TRACT | Status: DC
  Administered 2016-12-09 – 2016-12-11 (×8): 0.63 mg via RESPIRATORY_TRACT
  Filled 2016-12-09 (×9): qty 3

## 2016-12-09 NOTE — Progress Notes (Signed)
Became combative , pulled out telemetry leads and oxygen, attempted to pull out iv line and trying to get out of bed. Threatened and accusing staff of killing her and stealing from the hospital. Tried to kick and pinched staff by the hand. Pulse ox -80's. Ativan 0.5 mg iv given, mittens applied bil.Placed on bipap, RT .aware. Went to sleep after few min. MD aware, Dr. Radford Pax  Daughter-in law also made aware. Continue to monitor.

## 2016-12-09 NOTE — Progress Notes (Signed)
Woke up started pulling out bipap mask and telemetry leads, seen by MD. Ativan 2mg  iv given. Chest x-ray done , result seen by MD. Calm down at once. Continue to monitor.

## 2016-12-09 NOTE — Progress Notes (Signed)
Placed on 100% NRM desats to 80's. Placed back on bipap with FIO2 70 % sat -90's.

## 2016-12-09 NOTE — Progress Notes (Signed)
PT Cancellation Note  Patient Details Name: [name] MRN: [id number] DOB: January 17, 1932   Cancelled Treatment:    Reason Eval/Treat Not Completed:  (Pt sats mid to high 80's on Bipap. Pt kicking, scratching per nursing.  Nurse told PT to [place] 12/09/2016, 8:50 AM  [facility] [phone] (pager)

## 2016-12-09 NOTE — Progress Notes (Signed)
Kept pulling out bipap mask, placed on 100% NRM sats on 90's. Continue to monitor.

## 2016-12-09 NOTE — Progress Notes (Signed)
SLP Cancellation Note  Patient Details Name: [name] MRN: [id number] DOB: [birth year]   Cancelled treatment:        Reviewed chart notes and spoke with RN re: pt's agitation this am. She presently is on BiPAP. Will continue efforts.    [name] 12/09/2016, 10:16 AM    Orbie Pyo Colvin Caroli.[employer] [phone]

## 2016-12-09 NOTE — Consult Note (Signed)
Consultation Note Date: 12/09/2016   Patient Name: [name]  DOB: [birth year]  MRN: [id number]  Age / Sex: 83 y.o., female  PCP: Hoyt Koch, MD Referring Physician: Allie Bossier, MD  Reason for Consultation: Establishing goals of care  HPI/Patient Profile: 83 y.o. female  with past medical history of acute on chronic respiratory failure, COPD on home oxygen 2L [place], OSA, diastolic CHF, [name], HLD, stroke, GERD, [name], CKD stage 3, [name], pacemaker, and breast cancer admitted on 11/28/2016 with cough, shortness of breath, and diarrhea. Patient found to have respiratory distress and SNF and hypoxic with oxygen saturations low 80's. In ED, patient with hypotension, WBC's 23.5, lactic acid 2.53, BNP 156.7, ABG with pH of 7.376, CO2 29 and PO2 110. Chest xray revealed possible right basal infiltration. BiPAP initiated. Treated for aspiration pneumonia and zosyn x8 days. Patient remains in SDU due to high oxygen requirements--HFNC and BiPAP prn/HS. Patient with intermittent confusion/agitation/delirium requiring prn ativan. Palliative medicine consultation for goals of care.    Clinical Assessment and Goals of Care: I have reviewed medical records, discussed with care team and initially met with patient at bedside. Episode of agitation requiring ativan and safety mitts this AM. She appears comfortable on BiPAP.  Spoke with daughter, Scheryl Marten, via telephone. She will be arriving by tomorrow morning from [place]. Other children coming to [place] from [place] and [place].   Introduced Palliative Medicine as specialized medical care for people living with serious illness.   [name] shares her mother's history of multiple chronic conditions with me. She speaks of her COPD for many years. She quit smoking 3 years ago. [name] had a further decline in health this past February when she  was hospitalized for sepsis due to pneumonia. She has required continuous oxygen since February. She has worn CPAP HS for years but recently not tolerated as well due to claustrophobia. [name] tells me that the family has "almost lost her" in the past and she is not surprised that they are faced with these circumstances again.   Discussed hospital diagnoses and interventions. [name] and sister-in-law ([name]) have had conversations with Dr. Thereasa Solo and aware of worsening clinical condition and need for palliative consultation.   [name] has an advanced directive that I reviewed in Epic. [name] is primary HCPOA. She also has other children listed--[name], [name], and [name]. It is important for children to make decisions together.   [name] speaks of a moment of lucidity last night between her mother and [name]. Her mother asked "how sick am I" and [name] explained that she is very sick. [name] wonders if her mother has been "emotional" this morning because she is upset she is dying or if she even remembers the conversation last night.   Offered continued support from palliative services during hospitalization. [name] has arranged family meeting with palliative provider tomorrow.    SUMMARY OF RECOMMENDATIONS    DNR. Continue current interventions.   Family arriving tomorrow. PMT meeting on 12/10/16 @ 4pm.  Code Status/Advance Care Planning:  DNR  Symptom Management:   Patient may respond better to Haldol for hospital delirium.  May benefit from low-dose Seroquel HS instead of Ambien.   Palliative Prophylaxis:   Aspiration, Delirium Protocol, Frequent Pain Assessment, Oral Care and Turn Reposition  Psycho-social/Spiritual:   Desire for further Chaplaincy support:yes  Additional Recommendations: Caregiving  Support/Resources, Compassionate Wean Education and Education on Hospice  Prognosis:   Unable to determine: guarded with acute on chronic respiratory failure  secondary to COPD requiring HFNC and BiPAP.  Discharge Planning: To Be Determined      Primary Diagnoses: Present on Admission: . Atrial fibrillation ([place]) . Controlled diabetes mellitus type 2 with complications ([place]) . Diastolic CHF, chronic ([place]) . GERD (gastroesophageal reflux disease) . Hyperlipidemia . PACEMAKER, PERMANENT . Acute on chronic respiratory failure with hypoxia ([place]) . HCAP (healthcare-associated pneumonia) . Sepsis ([place]) . Acute renal failure superimposed on stage 3 chronic kidney disease ([place]) . [name] (coronary artery disease) . Diarrhea . COPD with acute exacerbation ([place]) . Septic shock ([place])   I have reviewed the medical record, interviewed the patient and family, and examined the patient. The following aspects are pertinent.  Past Medical History:  Diagnosis Date  . Acute blood loss anemia 04/20/2012  . AF (atrial fibrillation) ([place])     AV ablation 9/09 [place] per Dr Ola Spurr - AV node ablation 9/11 Dr Caryl Comes  . Amiodarone pulmonary toxicity   . Asthma   . Bronchitis    hx of   . [name] (coronary artery disease)    (not sure of this 11/10  . CHF (congestive heart failure) ([place])   . Chronic renal insufficiency, stage III (moderate)    CrCl about 60 ml/min  . CKD (chronic kidney disease) stage 3, GFR 30-59 ml/min   . Complication of anesthesia    "psycotic episode" after hip surg - resolved  . COPD (chronic obstructive pulmonary disease) (HCC)    emphysema -FeV1 73% DLCO 53% 5/09  . CVA (cerebral vascular accident) ([place])    no residual effects evident to pt   . Depression 06/06/2012  . Diabetes ([place])   . Diastolic heart failure    Acute on Chronic  . Eczema   . GERD (gastroesophageal reflux disease)   . History of skin cancer   . Hx of cardiovascular stress test    Lexiscan Myoview (09/2013):  No definite ischemia, EF 67%; low risk  . Hyperlipidemia   . Invasive ductal carcinoma of breast ([place]) 2011   LEFT   . Mental disorder   . OA  (osteoarthritis) of knee    RIGHT  . Pacemaker    Permanent  . PAD (peripheral artery disease) (HCC)    w/hx right iliac/SFA stenting and left and rt leg PTA  . Pain    pt states has pain in fingertips per right hand pt states has been told may be carpal tunnel  . Pneumonia    hx of   . Right sided sciatica   . Shortness of breath dyspnea    walking distances   . Sinoatrial node dysfunction (HCC)   . Sleep apnea    associated with hypersomnia uses O2 2L/M at night and during naps also uses CPAP  . Tobacco abuse   . Tremors of nervous system    hands bilat    Social History   Social History  . Marital status: Married    Spouse name: N/A  . Number of children: N/A  . Years  of education: N/A   Social History Main Topics  . Smoking status: Former Smoker    Packs/day: 1.00    Years: 55.00    Types: Cigarettes    Quit date: 12/19/2011  . Smokeless tobacco: Never Used     Comment: started smoking at age 32--4 cigs per day.  smoked 2ppd the last 10 yrs  . Alcohol use Yes     Comment: wine (rare)  . Drug use: No  . Sexual activity: No   Other Topics Concern  . None   Social History Narrative   HS Graduate; [employer].  Married '61.  2 sons - '70, '71; Dtrs - '64,'68;    6 grandchildren.  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 Interests -Tai-chi & water aerobics, gardening, active lifestyle.  Marriage a bit stressful - SO w/membory problems and difficult behavior. She denies any personal safety concerns. End of life care: need to address at next OV             Family History  Problem Relation Age of Onset  . Heart disease Father    Scheduled Meds: . apixaban  5 mg Oral BID  . atorvastatin  20 mg Oral Daily  . chlorhexidine  15 mL Mouth Rinse BID  . furosemide  40 mg Oral Daily  . haloperidol lactate  1  mg Intravenous Q6H  . insulin aspart  0-9 Units Subcutaneous TID WC  . levalbuterol  0.63 mg Nebulization TID  . loratadine  10 mg Oral Daily  . mouth rinse  15 mL Mouth Rinse q12n4p  . mometasone-formoterol  2 puff Inhalation BID  . montelukast  10 mg Oral QHS  . multivitamin with minerals  1 tablet Oral BID  . polyethylene glycol  17 g Oral BID  . predniSONE  20 mg Oral Q breakfast  . senna-docusate  1 tablet Oral BID  . tiotropium  18 mcg Inhalation Daily   Continuous Infusions: PRN Meds:.acetaminophen, dextromethorphan, food thickener, guaiFENesin, LORazepam, ondansetron (ZOFRAN) IV, sodium chloride flush, zolpidem Medications Prior to Admission:  Prior to Admission medications   Medication Sig Start Date End Date Taking? Authorizing Provider  acetaminophen (TYLENOL) 325 MG tablet Take 650 mg by mouth every 6 (six) hours as needed for mild pain or fever.   Yes [provider]  ADVAIR DISKUS 250-50 MCG/DOSE AEPB INHALE 1 PUFF TWICE DAILY  (30  DAY  EXPIRATION) 08/12/16  Yes Mannam, Praveen, MD  AMITIZA 24 MCG capsule TAKE 1 CAPSULE TWICE DAILY WITH A MEAL 03/10/16  Yes Hoyt Koch, MD  atorvastatin (LIPITOR) 20 MG tablet TAKE 1 TABLET EVERY DAY 03/10/16  Yes Deboraha Sprang, MD  Biotin (BIOTIN 5000) 5 MG CAPS Take 5 mg by mouth at bedtime.    Yes [provider]  ELIQUIS 5 MG TABS tablet TAKE 1 TABLET TWICE DAILY  (MUST  ESTABLISH  NEW  PRIMARY CARE PROVIDER  FOR FUTURE REFILLS) 11/14/15  Yes Hoyt Koch, MD  furosemide (LASIX) 40 MG tablet TAKE ONE TABLET BY MOUTH ONCE DAILY 07/16/16  Yes Hoyt Koch, MD  levalbuterol [facility] HFA) 45 MCG/ACT inhaler Inhale 2 puffs into the lungs every 6 (six) hours as needed. Reported on 09/23/2015 09/24/16  Yes Mannam, Praveen, MD  levalbuterol (XOPENEX) 0.63 MG/3ML nebulizer solution Take 0.63 mg by nebulization every 4 (four) hours as needed  for wheezing or shortness of breath.   Yes [provider]    metFORMIN (GLUCOPHAGE) 500 MG tablet TAKE 1 TABLET TWICE DAILY WITH A MEAL 10/07/16  Yes Golden Circle, FNP  montelukast (SINGULAIR) 10 MG tablet TAKE ONE TABLET AT BEDTIME 11/13/16  Yes Hoyt Koch, MD  MUCINEX 600 MG 12 hr tablet TAKE ONE TABLET TWICE DAILY 09/21/16  Yes Nche, Charlene Brooke, NP  Potassium Chloride ER 20 MEQ TBCR TAKE 2 TABLETS IN THE MORNING  AND TAKE 2 TABLETS IN THE EVENING Patient taking differently: TAKE 2 TABLETS IN THE EVENING 09/02/16  Yes Deboraha Sprang, MD  SPIRIVA HANDIHALER 18 MCG inhalation capsule INHALE THE CONTENTS OF 1 CAPSULE EVERY DAY 08/12/16  Yes Mannam, Praveen, MD  B-D ULTRA-FINE 33 LANCETS MISC Use to help check blood sugars daily Dx E11.9 07/30/15   Hoyt Koch, MD  glucose blood (BAYER CONTOUR TEST) test strip 1 each by Other route daily. Use to check blood sugars every day Dx E11.9 07/18/15   Hoyt Koch, MD  levofloxacin (LEVAQUIN) 500 MG tablet Take 1 tablet (500 mg total) by mouth daily. 11/28/16   McGowen, Adrian Blackwater, MD   Allergies  Allergen Reactions  . Cholestatin Other (See Comments)    RAGWEED SEASON...sneezing   . Sulfur Itching   Review of Systems  Unable to perform ROS: Mental status change   Physical Exam  Constitutional: She is easily aroused.  HENT:  Head: Normocephalic and atraumatic.  Cardiovascular:  V-paced  Pulmonary/Chest: No accessory muscle usage. No tachypnea. No respiratory distress.  Currently on BiPAP  Neurological: She is alert and easily aroused.  Confused/periods of agitation  Skin: Skin is warm and dry. There is pallor.  Psychiatric: She is inattentive.  Nursing note and vitals reviewed.  Vital Signs: BP 124/60 (BP Location: Left Arm)   Pulse 78   Temp 98.3 F (36.8 C) (Oral)   Resp 15   Ht _0  (1.676 m)   Wt 68.4 kg (150 lb 12.7 oz)   SpO2 93%   BMI 24.34 kg/m  Pain Assessment: No/denies pain POSS *See Group Information*: 1-Acceptable,Awake and alert Pain Score:  Asleep   SpO2: SpO2: 93 % O2 Device:SpO2: 93 % O2 Flow Rate: .O2 Flow Rate (L/min): 10 L/min  IO: Intake/output summary:   Intake/Output Summary (Last 24 hours) at 12/09/16 1629 Last data filed at 12/08/16 1800  Gross per 24 hour  Intake              120 ml  Output                0 ml  Net              120 ml    LBM: Last BM Date: 12/08/16 Baseline Weight: Weight: 73.3 kg (161 lb 9.6 oz) Most recent weight: Weight: 68.4 kg (150 lb 12.7 oz)     Palliative Assessment/Data: PPS 40%   Flowsheet Rows     Most Recent Value  Intake Tab  Referral Department  Hospitalist  Unit at Time of Referral  Intermediate Care Unit  Palliative Care Primary Diagnosis  Pulmonary  Palliative Care Type  New Palliative care  Reason for referral  Clarify Goals of Care  Date first seen by Palliative Care  12/09/16  Clinical Assessment  Palliative Performance Scale Score  40%  Psychosocial & Spiritual Assessment  Palliative Care Outcomes  Patient/Family meeting held?  Yes  Who was at the meeting?  daughter via telephone   Palliative Care Outcomes  Clarified goals of care, Provided psychosocial or spiritual support, Provided end of life care assistance      Time In: 1000 Time Out: 1050 Time Total: 2mn Greater than 50%  of this time was spent counseling and coordinating care related to the above assessment and plan.  Signed by:  MIhor Dow FNP-C Palliative Medicine Team  Phone: [phone]: [phone]  Please contact Palliative Medicine Team phone at [phone] questions and concerns.  For individual provider: See AShea Evans

## 2016-12-09 NOTE — Progress Notes (Addendum)
PROGRESS NOTE    KYNZLEE HUCKER  GGE:[id number] DOB: January 01, 1932 DOA: 11/28/2016 PCP: Hoyt Koch, MD   Brief Narrative:  83 y.o. WF PMHx CVA, COPD/Chronic Respiratory Failure (on 2 L O2 at home).Asthma, Former smoker, HLD,, GERD, [name], Complete Heart Block s/p of placement, pacer, Chronic Diastolic CHF, [name], [name], Breast Ca,  CKD stage III,  Presents with cough, shortness breath, diarrhea.   Patient states that she has been having cough for almost 2 weeks, which has worsened today. Patient was noted to have respiratory distress in facility. She coughs up brownish colored sputum. She was found to have hypoxic with oxygen saturation in the low 80s upon arrival. Patient is on 2 L of oxygen at all times and needs more oxygen requirement. Patient denies chest pain, fever or chills. She states that she has diarrhea since last night. She had 3 loose stool bowel movements. No nausea, vomiting, abdominal pain. She is taking Amitiza for constipation. Denies symptoms of UTI or unilateral weakness. Patient was initially hypotensive with a blood pressure 84/65, which improved to 91/43 after 2.75 L normal saline bolus in ED.    Subjective: 10/3 A/O 2 (does not know where, why). Does know she is a hospital. States she was forced to come to the hospital by her family. Currently agitated believes that the staff is attempting to force her to have treatments she does not want to have performed. States wants to just a home, does not want her family to be involved in her care. Threatening to sue the nurses, and myself.            Assessment & Plan:   Principal Problem:   Acute on chronic respiratory failure with hypoxia (HCC) Active Problems:   Controlled diabetes mellitus type 2 with complications (HCC)   Hyperlipidemia   [name] (coronary artery disease)   Atrial fibrillation (HCC)   PACEMAKER, PERMANENT   Diastolic CHF, chronic (HCC)   GERD (gastroesophageal reflux  disease)   COPD with acute exacerbation (HCC)   Sepsis ([place])   HCAP (healthcare-associated pneumonia)   Acute renal failure superimposed on stage 3 chronic kidney disease (HCC)   Diarrhea   Septic shock (HCC)   Chronic Systolic CHF (last YY=50%)/PT node Dysfunction(unknown base weight) -sees Dr. Sherren Mocha Cardiologist/ Dr. Caryl Comes EP -S/P pacer -Eliquis 5 mg BID -Lasix 40 mg daily -strict in and out since admission -8.2 L -Daily weight Filed Weights   12/07/16 0239 12/08/16 0455 12/09/16 0443  Weight: 155 lb 3.3 oz (70.4 kg) 151 lb 3.8 oz (68.6 kg) 150 lb 12.7 oz (68.4 kg)  -10/3 PCXR:    Paroxysmal A-fib   -currently sinus tachycardia(secondary to agitation) -see CHF  Bilateral Pleural Effusion -See CHF: Slightly improved   Septic Shock -Secondary to aspiration pneumonia vs pneumonitis/COPD exacerbation  -see aspiration pneumonia   Acute Respiratory Failure with Hypoxia/Aspiration pneumonia/COPD exacerbation -On Home oxygen at 2L. Sees Dr.Praveen Mannam PC CM -multifactorial CHF exacerbation, aspiration pneumonia, COPD exacerbation -overnight SPO2 in the 80s per nursing staff. -titrate O2 to maintain SPO2 80-93%: Currently on BiPAP -Ativan PRN -Haldol 1 mg QID -Flutter valve -Dulera 200-5 micrograms BID -Completed 8 day course antibiotics per PCCM  Agitation -See acute respiratory failure   Diabetes type 2 controlled with renal complication  -4/65 Hemoglobin A1c=7.0 -sensitive SSI   Acute renal failure -Resolved   -Trend creatinine    Recent Labs Lab 12/03/16 0223 12/05/16 6812 12/06/16 0238 12/07/16 0214 12/08/16 0131 12/09/16 0252  CREATININE 1.05* 1.33* 1.42* 1.27* 1.10* 1.20*  -stable  Hypokalemia -Potassium goal> 4   Hypomagnesemia --magnesium goal>2   Hypophosphatemia -See hypokalemia      DVT prophylaxis: Eliquis Code Status: Full Family Communication: spoke with Dr. Golden Hurter (daughter-in-law), family intends to meet  this afternoon with palliative care and patient to discuss goals of care. Disposition Plan: TBD await recommendation from family meeting with palliative care     Consults [facility]    Procedures/Significant Events:  9/22 admit to [facility] 9/23 PCCM assumed care  9/26 [facility] assumed care  9/26 PCXR: Consistent with fluid overload     I have personally reviewed and interpreted all radiology studies and my findings are as above.  VENTILATOR SETTINGS:    Cultures 9/22 blood NGTD 9/23 MRSA by PCR negative 9/23 urine negative 9/25 sputum pending    Antimicrobials:Anti-infectives    Start     Dose/Rate Stop   12/01/16 1400  piperacillin-tazobactam (ZOSYN) IVPB 3.375 g  Status:  Discontinued     3.375 g 12.5 mL/hr over 240 Minutes 12/06/16 1104   11/30/16 1800  cefTRIAXone (ROCEPHIN) 1 g in dextrose 5 % 50 mL IVPB  Status:  Discontinued     1 g 100 mL/hr over 30 Minutes 12/01/16 1051   11/30/16 0900  cefTRIAXone (ROCEPHIN) 1 g in dextrose 5 % 50 mL IVPB  Status:  Discontinued     1 g 100 mL/hr over 30 Minutes 11/30/16 0901   11/29/16 2300  vancomycin (VANCOCIN) IVPB 750 mg/150 ml premix  Status:  Discontinued     750 mg 150 mL/hr over 60 Minutes 11/30/16 0855   11/29/16 2200  azithromycin (ZITHROMAX) 500 mg in dextrose 5 % 250 mL IVPB  Status:  Discontinued     500 mg 250 mL/hr over 60 Minutes 11/29/16 1311   11/29/16 0000  piperacillin-tazobactam (ZOSYN) IVPB 3.375 g  Status:  Discontinued     3.375 g 12.5 mL/hr over 240 Minutes 11/30/16 0855   11/28/16 2300  vancomycin (VANCOCIN) IVPB 1000 mg/200 mL premix     1,000 mg 200 mL/hr over 60 Minutes 11/29/16 0128   11/28/16 1830  cefTRIAXone (ROCEPHIN) 1 g in dextrose 5 % 50 mL IVPB     1 g 100 mL/hr over 30 Minutes 11/28/16 1925   11/28/16 1830  azithromycin (ZITHROMAX) 500 mg in dextrose 5 % 250 mL IVPB     500 mg 250 mL/hr over 60 Minutes 11/28/16 2053       Devices    LINES / TUBES:      Continuous  Infusions:    Objective: Vitals:   12/08/16 1925 12/08/16 1931 12/08/16 2325 12/09/16 0443  BP: (!) 94/51  (!) 126/58 (!) 101/44  Pulse: 73  71 74  Resp: 19  18 13   Temp: 98.2 F (36.8 C)  97.7 F (36.5 C) (!) 97.5 F (36.4 C)  TempSrc: Oral  Axillary Oral  SpO2: 94% 95% 100% 96%  Weight:    150 lb 12.7 oz (68.4 kg)  Height:        Intake/Output Summary (Last 24 hours) at 12/09/16 0726 Last data filed at 12/08/16 1800  Gross per 24 hour  Intake              440 ml  Output              400 ml  Net               40 ml  Filed Weights   12/07/16 0239 12/08/16 0455 12/09/16 0443  Weight: 155 lb 3.3 oz (70.4 kg) 151 lb 3.8 oz (68.6 kg) 150 lb 12.7 oz (68.4 kg)    Physical Exam:  General: A/O 2 (does not know where, why), more confused this a.m. Along with increasedhostility toward staff, positive acute on chronic respiratory distress Neck:  Negative scars, masses, torticollis, lymphadenopathy, JVD Lungs: tachypnea, positive diffuse decreased breath sounds,negative expiratory wheeze Cardiovascular: tachycardic,Regular rhythm without murmur gallop or rub normal S1 and S2 Abdomen: negative abdominal pain, nondistended, positive soft, bowel sounds, no rebound, no ascites, no appreciable mass Extremities: No significant cyanosis, clubbing, or edema bilateral lower extremities Skin: Negative rashes, lesions, ulcers Psychiatric:  Cannot fully assess secondary to confusion and belligerence. Central nervous system:  Cranial nerves II through XII intact, tongue/uvula midline, all extremities muscle strength 5/5, sensation intact throughout, negative dysarthria, negative expressive aphasia, negative receptive aphasia.  .     Data Reviewed: Care during the described time interval was provided by me .  I have reviewed this patient's available data, including medical history, events of note, physical examination, and all test results as part of my evaluation.   CBC:  Recent  Labs Lab 12/03/16 0223 12/05/16 0307  WBC 14.0* 13.7*  HGB 11.5* 13.3  HCT 35.8* 40.8  MCV 84.8 85.2  PLT 310 101   Basic Metabolic Panel:  Recent Labs Lab 12/03/16 1229 12/05/16 0307 12/06/16 0238 12/07/16 0214 12/08/16 0131 12/09/16 0252  NA  --  145 143 141 139 137  K 4.2 2.9* 3.8 4.6 3.4* 3.8  CL  --  100* 99* 102 100* 99*  CO2  --  30 32 30 29 29   GLUCOSE  --  220* 216* 178* 153* 140*  BUN  --  26* 26* 28* 23* 21*  CREATININE  --  1.33* 1.42* 1.27* 1.10* 1.20*  CALCIUM  --  9.7 9.6 9.6 9.2 9.2  MG 2.2  --   --   --   --   --   PHOS 2.6  --   --   --   --   --    GFR: Estimated Creatinine Clearance: 33.3 mL/min (A) (by C-G formula based on SCr of 1.2 mg/dL (H)). Liver Function Tests:  Recent Labs Lab 12/03/16 0223 12/05/16 0307  AST 15 20  ALT 10* 12*  ALKPHOS 67 67  BILITOT 1.0 0.9  PROT 7.6 7.9  ALBUMIN 2.5* 2.9*   No results for input(s): LIPASE, AMYLASE in the last 168 hours. No results for input(s): AMMONIA in the last 168 hours. Coagulation Profile: No results for input(s): INR, PROTIME in the last 168 hours. Cardiac Enzymes: No results for input(s): CKTOTAL, CKMB, CKMBINDEX, TROPONINI in the last 168 hours. BNP (last 3 results) No results for input(s): PROBNP in the last 8760 hours. HbA1C: No results for input(s): HGBA1C in the last 72 hours. CBG:  Recent Labs Lab 12/07/16 2109 12/08/16 0812 12/08/16 1235 12/08/16 1652 12/08/16 2107  GLUCAP 151* 141* 197* 251* 175*   Lipid Profile: No results for input(s): CHOL, HDL, LDLCALC, TRIG, CHOLHDL, LDLDIRECT in the last 72 hours. Thyroid Function Tests: No results for input(s): TSH, T4TOTAL, FREET4, T3FREE, THYROIDAB in the last 72 hours. Anemia Panel: No results for input(s): VITAMINB12, FOLATE, FERRITIN, TIBC, IRON, RETICCTPCT in the last 72 hours. Urine analysis:    Component Value Date/Time   COLORURINE YELLOW 11/29/2016 [place] 11/29/2016 0331   LABSPEC 1.010  11/29/2016 0331  PHURINE 5.0 11/29/2016 0331   GLUCOSEU 50 (A) 11/29/2016 0331   HGBUR SMALL (A) 11/29/2016 0331   BILIRUBINUR NEGATIVE 11/29/2016 0331   KETONESUR NEGATIVE 11/29/2016 0331   PROTEINUR NEGATIVE 11/29/2016 0331   UROBILINOGEN 0.2 10/24/2014 1433   NITRITE NEGATIVE 11/29/2016 0331   LEUKOCYTESUR NEGATIVE 11/29/2016 0331   Sepsis Labs: @LABRCNTIP (procalcitonin:4,lacticidven:4)  ) No results found for this or any previous visit (from the past 240 hour(s)).       Radiology Studies: No results found.      Scheduled Meds: . apixaban  5 mg Oral BID  . atorvastatin  20 mg Oral Daily  . chlorhexidine  15 mL Mouth Rinse BID  . furosemide  40 mg Oral Daily  . insulin aspart  0-9 Units Subcutaneous TID WC  . levalbuterol  0.63 mg Nebulization QID  . loratadine  10 mg Oral Daily  . mouth rinse  15 mL Mouth Rinse q12n4p  . mometasone-formoterol  2 puff Inhalation BID  . montelukast  10 mg Oral QHS  . multivitamin with minerals  1 tablet Oral BID  . polyethylene glycol  17 g Oral BID  . predniSONE  20 mg Oral Q breakfast  . senna-docusate  1 tablet Oral BID  . tiotropium  18 mcg Inhalation Daily   Continuous Infusions:    LOS: 11 days     Time spent: 40 minutes    Jovian Lembcke, Geraldo Docker, MD [facility] Pager [phone]   If 7PM-7AM, please contact night-coverage [email or web address] Password [email or web address] 12/09/2016, 7:26 AM

## 2016-12-10 LAB — CBC
HEMATOCRIT: 41.7 % (ref 36.0–46.0)
Hemoglobin: 13.3 g/dL (ref 12.0–15.0)
MCH: 27.3 pg (ref 26.0–34.0)
MCHC: 31.9 g/dL (ref 30.0–36.0)
MCV: 85.6 fL (ref 78.0–100.0)
Platelets: 240 10*3/uL (ref 150–400)
RBC: 4.87 MIL/uL (ref 3.87–5.11)
RDW: 17.6 % — AB (ref 11.5–15.5)
WBC: 19.7 10*3/uL — ABNORMAL HIGH (ref 4.0–10.5)

## 2016-12-10 LAB — BASIC METABOLIC PANEL
Anion gap: 15 (ref 5–15)
BUN: 20 mg/dL (ref 6–20)
CALCIUM: 9.2 mg/dL (ref 8.9–10.3)
CO2: 24 mmol/L (ref 22–32)
Chloride: 102 mmol/L (ref 101–111)
Creatinine, Ser: 1.18 mg/dL — ABNORMAL HIGH (ref 0.44–1.00)
GFR calc Af Amer: 48 mL/min — ABNORMAL LOW (ref >60)
GFR, EST NON AFRICAN AMERICAN: 41 mL/min — AB (ref >60)
GLUCOSE: 90 mg/dL (ref 65–99)
Potassium: 5.4 mmol/L — ABNORMAL HIGH (ref 3.5–5.1)
Sodium: 141 mmol/L (ref 135–145)

## 2016-12-10 LAB — GLUCOSE, CAPILLARY
Glucose-Capillary: 94 mg/dL (ref 65–99)
Glucose-Capillary: 100 mg/dL — ABNORMAL HIGH (ref 65–99)
Glucose-Capillary: 127 mg/dL — ABNORMAL HIGH (ref 65–99)
Glucose-Capillary: 113 mg/dL — ABNORMAL HIGH (ref 65–99)

## 2016-12-10 LAB — MAGNESIUM: MAGNESIUM: 2.6 mg/dL — AB (ref 1.7–2.4)

## 2016-12-10 MED ORDER — FUROSEMIDE 40 MG PO TABS
40.0000 mg | ORAL_TABLET | Freq: Two times a day (BID) | ORAL | Status: DC
  Administered 2016-12-10 – 2016-12-11 (×3): 40 mg via ORAL
  Filled 2016-12-10 (×3): qty 1

## 2016-12-10 MED ORDER — ALBUMIN HUMAN 25 % IV SOLN
50.0000 g | Freq: Once | INTRAVENOUS | Status: AC
  Administered 2016-12-10: 50 g via INTRAVENOUS
  Filled 2016-12-10: qty 200

## 2016-12-10 NOTE — Progress Notes (Signed)
PROGRESS NOTE    [name]  TDD:[id number] DOB: 1931/04/20 DOA: 11/28/2016 PCP: Hoyt Koch, MD   Brief Narrative:  83 y.o. WF PMHx CVA, COPD/Chronic Respiratory Failure (on 2 L O2 at home).Asthma, Former smoker, HLD,, GERD, [name], Complete Heart Block s/p of placement, pacer, Chronic Diastolic CHF, [name], [name], Breast Ca,  CKD stage III,   Presents with cough, shortness breath, diarrhea.   Patient states that she has been having cough for almost 2 weeks, which has worsened today. Patient was noted to have respiratory distress in facility. She coughs up brownish colored sputum. She was found to have hypoxic with oxygen saturation in the low 80s upon arrival. Patient is on 2 L of oxygen at all times and needs more oxygen requirement. Patient denies chest pain, fever or chills. She states that she has diarrhea since last night. She had 3 loose stool bowel movements. No nausea, vomiting, abdominal pain. She is taking Amitiza for constipation. Denies symptoms of UTI or unilateral weakness. Patient was initially hypotensive with a blood pressure 84/65, which improved to 91/43 after 2.75 L normal saline bolus in ED.    Subjective: 10/4 A/O 3 (does not know where). Does know she is a hospital. States will do anything we 1 her to do in order to get well.     Assessment & Plan:   Principal Problem:   Acute on chronic respiratory failure with hypoxia (HCC) Active Problems:   Controlled diabetes mellitus type 2 with complications (HCC)   Hyperlipidemia   [name] (coronary artery disease)   Atrial fibrillation (HCC)   PACEMAKER, PERMANENT   Diastolic CHF, chronic (HCC)   GERD (gastroesophageal reflux disease)   COPD exacerbation (HCC)   Sepsis ([place])   HCAP (healthcare-associated pneumonia)   Acute renal failure superimposed on stage 3 chronic kidney disease (HCC)   Diarrhea   Septic shock (HCC)   Agitation   Palliative care by specialist   Chronic  Systolic CHF (last WC=37%)/SE node Dysfunction(unknown base weight) -sees Dr. Sherren Mocha Cardiologist/ Dr. Caryl Comes EP -S/P pacer -Eliquis 5 mg BID -10/4 albumin 50 g 1 -10/4 increased Lasix 40 mg BID -strict in and out since admission -8.1 L -Daily weight      Filed Weights    12/07/16 0239 12/08/16 0455 12/09/16 0443  Weight: 155 lb 3.3 oz (70.4 kg) 151 lb 3.8 oz (68.6 kg) 150 lb 12.7 oz (68.4 kg)  -10/3 PCXR: Persistent infiltrate and small effusion at right lung base -Will try slightly more aggressive diuresis, not convinced will obtain significant improvement.   Paroxysmal A-fib   -NSR -see CHF   Bilateral Pleural Effusion -See CHF: Slightly improved   Septic Shock -Secondary to aspiration pneumonia vs pneumonitis/COPD exacerbation  -see aspiration pneumonia   Acute Respiratory Failure with Hypoxia/Aspiration pneumonia/COPD exacerbation -On Home oxygen at 2L. Sees Dr.Praveen Mannam PC CM -multifactorial CHF exacerbation, aspiration pneumonia, COPD exacerbation -titrate O2 to maintain SPO2 80-93%: Currently alternates between nonrebreather/HFNC -Ativan PRN -Haldol 1 mg QID -Flutter valve -Dulera 200-5 micrograms BID -Completed 8 day course antibiotics per PCCM   Agitation -See acute respiratory failure   Diabetes type 2 controlled with renal complication  -8/31 Hemoglobin A1c=7.0 -Sensitive SSI   Acute renal failure -Resolved   -Trend creatinine    Recent Labs Lab 12/05/16 0307 12/06/16 0238 12/07/16 0214 12/08/16 0131 12/09/16 0252 12/10/16 1208  CREATININE 1.33* 1.42* 1.27* 1.10* 1.20* 1.18*  -stable   Hypokalemia -Potassium goal> 4  Hypomagnesemia --magnesium goal>2     Hypophosphatemia -See hypokalemia    DVT prophylaxis: Eliquis Code Status: DO NOT RESUSCITATE Family Communication: None Disposition Plan: TBD   Consultants:  PC CM    Procedures/Significant Events:  9/22 admit to [facility] 9/23 PCCM assumed care  9/26 [facility]  assumed care  9/26 PCXR: Consistent with fluid overload 10/3 PCXR: Persistent infiltrate small right pleural effusion   I have personally reviewed and interpreted all radiology studies and my findings are as above.  VENTILATOR SETTINGS:    Cultures 9/22 blood NGTD 9/23 MRSA by PCR negative 9/23 urine negative 9/25 sputum pending    Antimicrobials: Anti-infectives    Start     Stop   12/01/16 1400  piperacillin-tazobactam (ZOSYN) IVPB 3.375 g  Status:  Discontinued     12/06/16 1104   11/30/16 1800  cefTRIAXone (ROCEPHIN) 1 g in dextrose 5 % 50 mL IVPB  Status:  Discontinued     12/01/16 1051   11/30/16 0900  cefTRIAXone (ROCEPHIN) 1 g in dextrose 5 % 50 mL IVPB  Status:  Discontinued     11/30/16 0901   11/29/16 2300  vancomycin (VANCOCIN) IVPB 750 mg/150 ml premix  Status:  Discontinued     11/30/16 0855   11/29/16 2200  azithromycin (ZITHROMAX) 500 mg in dextrose 5 % 250 mL IVPB  Status:  Discontinued     11/29/16 1311   11/29/16 0000  piperacillin-tazobactam (ZOSYN) IVPB 3.375 g  Status:  Discontinued     11/30/16 0855   11/28/16 2300  vancomycin (VANCOCIN) IVPB 1000 mg/200 mL premix     11/29/16 0128   11/28/16 1830  cefTRIAXone (ROCEPHIN) 1 g in dextrose 5 % 50 mL IVPB     11/28/16 1925   11/28/16 1830  azithromycin (ZITHROMAX) 500 mg in dextrose 5 % 250 mL IVPB     11/28/16 2053       Devices    LINES / TUBES:      Continuous Infusions:   Objective: Vitals:   12/09/16 2302 12/10/16 0357 12/10/16 0752 12/10/16 0800  BP: (!) 121/53 (!) 114/51  (!) 117/55  Pulse: 77 74  78  Resp: (!) 21 (!) 23  17  Temp: 97.9 F (36.6 C) (!) 97.5 F (36.4 C)  97.6 F (36.4 C)  TempSrc: Oral Oral  Oral  SpO2: 91% 92% 93% (!) 89%  Weight:  157 lb 13.6 oz (71.6 kg)    Height:       No intake or output data in the 24 hours ending 12/10/16 1122 Filed Weights   12/08/16 0455 12/09/16 0443 12/10/16 0357  Weight: 151 lb 3.8 oz (68.6 kg) 150 lb 12.7 oz (68.4 kg) 157  lb 13.6 oz (71.6 kg)    Examination:  General:  A/O 3 (does not know where). Does know she is a hospital., follows commands, positive acute on chronic respiratory distress Neck:  Negative scars, masses, torticollis, lymphadenopathy, JVD Lungs: tachypnea, positive diffuse decreased breath sounds,negative expiratory wheeze Cardiovascular: tachycardic,Regular rhythm without murmur gallop or rub normal S1 and S2 Abdomen: negative abdominal pain, nondistended, positive soft, bowel sounds, no rebound, no ascites, no appreciable mass Extremities: No significant cyanosis, clubbing, or edema bilateral lower extremities Skin: Negative rashes, lesions, ulcers Psychiatric:   patient must less agitated, confesses that she wants to do whatever is necessary to improve her health.  Central nervous system:  Cranial nerves II through XII intact, tongue/uvula midline, all extremities muscle strength 5/5, sensation intact throughout, negative dysarthria,  negative expressive aphasia, negative receptive aphasia.    .     Data Reviewed: Care during the described time interval was provided by me .  I have reviewed this patient's available data, including medical history, events of note, physical examination, and all test results as part of my evaluation.   CBC:  Recent Labs Lab 12/05/16 0307  WBC 13.7*  HGB 13.3  HCT 40.8  MCV 85.2  PLT 518   Basic Metabolic Panel:  Recent Labs Lab 12/03/16 1229 12/05/16 0307 12/06/16 0238 12/07/16 0214 12/08/16 0131 12/09/16 0252  NA  --  145 143 141 139 137  K 4.2 2.9* 3.8 4.6 3.4* 3.8  CL  --  100* 99* 102 100* 99*  CO2  --  30 32 30 29 29   GLUCOSE  --  220* 216* 178* 153* 140*  BUN  --  26* 26* 28* 23* 21*  CREATININE  --  1.33* 1.42* 1.27* 1.10* 1.20*  CALCIUM  --  9.7 9.6 9.6 9.2 9.2  MG 2.2  --   --   --   --   --   PHOS 2.6  --   --   --   --   --    GFR: Estimated Creatinine Clearance: 36 mL/min (A) (by C-G formula based on SCr of 1.2 mg/dL  (H)). Liver Function Tests:  Recent Labs Lab 12/05/16 0307  AST 20  ALT 12*  ALKPHOS 67  BILITOT 0.9  PROT 7.9  ALBUMIN 2.9*   No results for input(s): LIPASE, AMYLASE in the last 168 hours. No results for input(s): AMMONIA in the last 168 hours. Coagulation Profile: No results for input(s): INR, PROTIME in the last 168 hours. Cardiac Enzymes: No results for input(s): CKTOTAL, CKMB, CKMBINDEX, TROPONINI in the last 168 hours. BNP (last 3 results) No results for input(s): PROBNP in the last 8760 hours. HbA1C: No results for input(s): HGBA1C in the last 72 hours. CBG:  Recent Labs Lab 12/09/16 0822 12/09/16 1149 12/09/16 1710 12/09/16 2114 12/10/16 0754  GLUCAP 136* 107* 98 94 94   Lipid Profile: No results for input(s): CHOL, HDL, LDLCALC, TRIG, CHOLHDL, LDLDIRECT in the last 72 hours. Thyroid Function Tests: No results for input(s): TSH, T4TOTAL, FREET4, T3FREE, THYROIDAB in the last 72 hours. Anemia Panel: No results for input(s): VITAMINB12, FOLATE, FERRITIN, TIBC, IRON, RETICCTPCT in the last 72 hours. Urine analysis:    Component Value Date/Time   COLORURINE YELLOW 11/29/2016 0331   APPEARANCEUR CLEAR 11/29/2016 0331   LABSPEC 1.010 11/29/2016 0331   PHURINE 5.0 11/29/2016 0331   GLUCOSEU 50 (A) 11/29/2016 0331   HGBUR SMALL (A) 11/29/2016 0331   BILIRUBINUR NEGATIVE 11/29/2016 0331   KETONESUR NEGATIVE 11/29/2016 0331   PROTEINUR NEGATIVE 11/29/2016 0331   UROBILINOGEN 0.2 10/24/2014 1433   NITRITE NEGATIVE 11/29/2016 0331   LEUKOCYTESUR NEGATIVE 11/29/2016 0331   Sepsis Labs: @LABRCNTIP (procalcitonin:4,lacticidven:4)  )No results found for this or any previous visit (from the past 240 hour(s)).       Radiology Studies: Dg Chest Port 1 View  Result Date: 12/09/2016 CLINICAL DATA:  Altered mental status.  Low O2 saturation. EXAM: PORTABLE CHEST 1 VIEW COMPARISON:  12/06/2016, 12/02/2016, and CT scan dated 11/29/2016 FINDINGS: Heart size and  pulmonary vascularity remain normal. Left lung is clear. Persistent haziness at the right lung base unchanged since 12/02/2016, is consistent with the area of infiltrate and effusion present on the prior CT scan. Pacemaker in place. Aortic atherosclerosis. No acute bone abnormality. IMPRESSION:  Persistent infiltrate and small effusion at the right lung base. No changes since 12/02/2016. Aortic atherosclerosis. Electronically Signed   By: Lorriane Shire M.D.   On: 12/09/2016 11:03        Scheduled Meds: . apixaban  5 mg Oral BID  . atorvastatin  20 mg Oral Daily  . chlorhexidine  15 mL Mouth Rinse BID  . furosemide  40 mg Oral Daily  . haloperidol lactate  1 mg Intravenous Q6H  . insulin aspart  0-9 Units Subcutaneous TID WC  . levalbuterol  0.63 mg Nebulization TID  . loratadine  10 mg Oral Daily  . mouth rinse  15 mL Mouth Rinse q12n4p  . mometasone-formoterol  2 puff Inhalation BID  . montelukast  10 mg Oral QHS  . multivitamin with minerals  1 tablet Oral BID  . polyethylene glycol  17 g Oral BID  . predniSONE  20 mg Oral Q breakfast  . senna-docusate  1 tablet Oral BID  . tiotropium  18 mcg Inhalation Daily   Continuous Infusions:   LOS: 12 days    Time spent: 40 minutes    WOODS, Geraldo Docker, MD [facility] Pager [phone]   If 7PM-7AM, please contact night-coverage [email or web address] Password [email or web address] 12/10/2016, 11:22 AM

## 2016-12-10 NOTE — Progress Notes (Signed)
PT refuses to wear Bipap.  RT will continue to monitor.

## 2016-12-10 NOTE — Progress Notes (Signed)
Daily Progress Note   Patient Name: [name]       Date: 12/10/2016 DOB: Nov 26, 1931  Age: 83 y.o. MRN#: [id number] Attending Physician: Allie Bossier, MD Primary Care Physician: Hoyt Koch, MD Admit Date: 11/28/2016  Reason for Consultation/Follow-up: Establishing goals of care  Subjective:  patient's children are all at the bedside, she is awake alert, off the BIPAP this afternoon, she is able to answer all questions appropriately, family meeting held, see below:   Length of Stay: 12  Current Medications: Scheduled Meds:  . apixaban  5 mg Oral BID  . atorvastatin  20 mg Oral Daily  . chlorhexidine  15 mL Mouth Rinse BID  . furosemide  40 mg Oral Daily  . haloperidol lactate  1 mg Intravenous Q6H  . insulin aspart  0-9 Units Subcutaneous TID WC  . levalbuterol  0.63 mg Nebulization TID  . loratadine  10 mg Oral Daily  . mouth rinse  15 mL Mouth Rinse q12n4p  . mometasone-formoterol  2 puff Inhalation BID  . montelukast  10 mg Oral QHS  . multivitamin with minerals  1 tablet Oral BID  . polyethylene glycol  17 g Oral BID  . predniSONE  20 mg Oral Q breakfast  . senna-docusate  1 tablet Oral BID  . tiotropium  18 mcg Inhalation Daily    Continuous Infusions:   PRN Meds: acetaminophen, dextromethorphan, food thickener, guaiFENesin, LORazepam, ondansetron (ZOFRAN) IV, sodium chloride flush, zolpidem  Physical Exam         Weak elderly lady Awake alert Regular breathing, diminished breath sounds S1 S2 Abdomen soft  Vital Signs: BP (!) 118/53 (BP Location: Left Arm)   Pulse 73   Temp 98.4 F (36.9 C) (Axillary)   Resp (!) 21   Ht 5\' 6"  (1.676 m)   Wt 71.6 kg (157 lb 13.6 oz)   SpO2 92%   BMI 25.48 kg/m  SpO2: SpO2: 92 % O2 Device: O2 Device:  High Flow Nasal Cannula O2 Flow Rate: O2 Flow Rate (L/min): 15 L/min  Intake/output summary:  Intake/Output Summary (Last 24 hours) at 12/10/16 1730 Last data filed at 12/10/16 1500  Gross per 24 hour  Intake              120 ml  Output  0 ml  Net              120 ml   LBM: Last BM Date: 12/08/16 Baseline Weight: Weight: 73.3 kg (161 lb 9.6 oz) Most recent weight: Weight: 71.6 kg (157 lb 13.6 oz)       Palliative Assessment/Data: PPS 40%   Flowsheet Rows     Most Recent Value  Intake Tab  Referral Department  Hospitalist  Unit at Time of Referral  Intermediate Care Unit  Palliative Care Primary Diagnosis  Pulmonary  Palliative Care Type  New Palliative care  Reason for referral  Clarify Goals of Care  Date first seen by Palliative Care  12/09/16  Clinical Assessment  Palliative Performance Scale Score  40%  Psychosocial & Spiritual Assessment  Palliative Care Outcomes  Patient/Family meeting held?  Yes  Who was at the meeting?  daughter via telephone   Palliative Care Outcomes  Clarified goals of care, Provided psychosocial or spiritual support, Provided end of life care assistance      Patient Active Problem List   Diagnosis Date Noted  . Agitation   . Palliative care by specialist   . Septic shock ([place]) 11/29/2016  . Acute on chronic respiratory failure with hypoxia ([place]) 11/28/2016  . HCAP (healthcare-associated pneumonia) 11/28/2016  . Acute renal failure superimposed on stage 3 chronic kidney disease ([place]) 11/28/2016  . Diarrhea 11/28/2016  . Gait instability 06/22/2016  . Recurrent falls 06/22/2016  . Chronic respiratory failure ([place]) 05/15/2016  . SOB (shortness of breath)   . Community acquired pneumonia 04/25/2016  . Sepsis ([place])   . Rotator cuff tear arthropathy of both shoulders 12/09/2015  . Tremors of nervous system 05/21/2015  . Complete heart block ([place]) 02/13/2015  . Acute blood loss anemia 01/28/2015  . Constipation 11/07/2014    . OA (osteoarthritis) of hip 10/31/2014  . Chest pain, atypical 06/07/2014  . Osteoporosis 01/10/2014  . Nicotine dependence in remission 06/28/2013  . COPD exacerbation ([place]) 05/29/2013  . GERD (gastroesophageal reflux disease) 07/19/2012  . Depression 06/06/2012  . Secondary hyperparathyroidism ([place]) 04/25/2012  . Gold stage C. COPD with frequent exacerbations 12/15/2011  . Diastolic CHF, chronic ([place]) 12/15/2011  . Goals of care, counseling/discussion 08/27/2011  . History of stroke 02/21/2010  . PACEMAKER, PERMANENT 02/21/2010  . Breast cancer of upper-inner quadrant of left female breast ([place]) 02/03/2010  . Allergic rhinitis 12/31/2009  . Carotid stenosis 08/17/2008  . Controlled diabetes mellitus type 2 with complications ([place]) 54/11/8117  . Obstructive sleep apnea 06/01/2008  . Atrial fibrillation ([place]) 06/01/2008  . [name] 06/01/2008  . Hyperlipidemia 12/30/2006  . [name] (coronary artery disease) 12/30/2006    Palliative Care Assessment & Plan   Patient Profile:    Assessment:  Admitted with cough, shortness of breath, diarrhea Recent hospitalization in February 2018 with sepsis PNA CHF exacerbation ?asp pna COPD exac Recent BIPAP dependency Agitation, ?acute delirium  Recommendations/Plan:   Family meeting: Patient is awake alert, responding appropriately, her family is at the bedside, including her husband of 77 years, son, 2 daughters. After introducing myself, I asked the patient why she was in the hospital. She stated that she was here because she was not able to swallow well. We discussed about aspiration, silent aspiration, SLP recommendations in detail.  In a broader context, we discussed about patient's wishes and values. She would like to live as well as she can, for as long as she can.  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  We discussed about her most important fears, she is afraid of dying.  PLAN: SNF rehab with palliative care following.   Additionally, outside the room, patient's children wished to discuss about disease trajectory, plans for hospice in the near future. All of their concerns addressed, we discussed in detail about hospice philosophy of care, including the type of care that can be provided in a residential hospice.   PMT will continue to follow along and  Help guide decision making.    Code Status:    Code Status Orders        Start     Ordered   12/02/16 1909  Do not attempt resuscitation (DNR)  Continuous    Question Answer Comment  In the event of cardiac or respiratory ARREST Do not call a "code blue"   In the event of cardiac or respiratory ARREST Do not perform Intubation, CPR, defibrillation or ACLS   In the event of cardiac or respiratory ARREST Use medication by any route, position, wound care, and other measures to relive pain and suffering. May use oxygen, suction and manual treatment of airway obstruction as needed for comfort.      12/02/16 1908    Code Status History    Date Active Date Inactive Code Status Order ID Comments User Context   11/29/2016  1:06 AM 12/02/2016  7:08 PM Full Code [id number]  Kandice Hams, MD ED   11/28/2016 10:00 PM 11/29/2016  1:06 AM Full Code [id number]  Ivor Costa, MD ED   04/26/2016  7:06 AM 05/03/2016  6:05 PM DNR [id number]  Luz Brazen, MD Inpatient   04/25/2016  4:06 PM 04/26/2016  7:06 AM DNR [id number]  Elwin Mocha, MD ED   04/25/2016  1:43 PM 04/25/2016  4:06 PM Full Code [id number]  Elwin Mocha, MD ED   02/13/2015  4:09 PM 02/13/2015  8:29 PM Full Code [id number]  Deboraha Sprang, MD Inpatient   10/31/2014  2:06 PM 11/03/2014  3:57 PM Full Code [id number]  Gaynelle Arabian, MD Inpatient   10/16/2013 12:09 PM 10/19/2013  4:37 PM Full Code [id number]  Gearlean Alf, MD Inpatient   05/14/2012  8:38 PM 05/17/2012  5:27 PM  Full Code [id number]  Etta Quill., DO ED   04/14/2012  5:51 PM 04/27/2012  5:47 PM Full Code [id number]  Iver Nestle, RN Inpatient       Prognosis:   guarded   Discharge Planning:  [place] for rehab with Palliative care service follow-up  Care plan was discussed with  Patient, husband, son, 2 daughters.   Thank you for allowing the Palliative Medicine Team to assist in the care of this patient.   Time In: 1600 Time Out: 1730 Total Time 90 Prolonged Time Billed  yes       Greater than 50%  of this time was spent counseling and coordinating care related to the above assessment and plan.  Loistine Chance, MD [phone]  Please contact Palliative Medicine Team phone at [phone] for questions and concerns.

## 2016-12-10 NOTE — Progress Notes (Signed)
SLP Cancellation Note  Patient Details Name: [name] MRN: [id number] DOB: 05-04-1931   Cancelled treatment:       Reason Eval/Treat Not Completed: Medical issues which prohibited therapy;Fatigue/lethargy limiting ability to participate. Discussed pt with RN - she says that the pt has been very lethargic today and unable to consume even PO meds due to inability to maintain alertness. Will hold swallowing therapy pending improved alertness - would hold POs as well.    Germain Osgood 12/10/2016, 11:59 AM  Germain Osgood, M.A. CCC-SLP [phone]

## 2016-12-10 NOTE — Progress Notes (Signed)
PT Cancellation Note  Patient Details Name: [name] MRN: [id number] DOB: 05-12-1931   Cancelled Treatment:    Reason Eval/Treat Not Completed: Medical issues which prohibited therapy (Desats at rest per nurse.  Combative at times. HOLD.)   Denice Paradise 12/10/2016, 10:43 AM [facility] [phone] (pager)

## 2016-12-10 NOTE — Progress Notes (Signed)
OT Cancellation Note  Patient Details Name: CRYSTEL DEMARCO MRN: [id number] DOB: [birth year]   Cancelled Treatment:    Reason Eval/Treat Not Completed: Medical issues which prohibited therapy.  Pt on BiPAP and desats at rest, also with periods of agitation.  OT will hold off at this time.  [place], OTR/L [phone]   Lucille Passy M 12/10/2016, 11:07 AM

## 2016-12-11 ENCOUNTER — Encounter (HOSPITAL_COMMUNITY): Payer: Self-pay | Admitting: Internal Medicine

## 2016-12-11 DIAGNOSIS — E118 Type 2 diabetes mellitus with unspecified complications: Secondary | ICD-10-CM | POA: Diagnosis not present

## 2016-12-11 DIAGNOSIS — E569 Vitamin deficiency, unspecified: Secondary | ICD-10-CM | POA: Diagnosis not present

## 2016-12-11 DIAGNOSIS — I482 Chronic atrial fibrillation: Secondary | ICD-10-CM | POA: Diagnosis not present

## 2016-12-11 DIAGNOSIS — I4891 Unspecified atrial fibrillation: Secondary | ICD-10-CM | POA: Diagnosis not present

## 2016-12-11 DIAGNOSIS — I480 Paroxysmal atrial fibrillation: Secondary | ICD-10-CM | POA: Diagnosis not present

## 2016-12-11 DIAGNOSIS — R262 Difficulty in walking, not elsewhere classified: Secondary | ICD-10-CM | POA: Diagnosis not present

## 2016-12-11 DIAGNOSIS — J449 Chronic obstructive pulmonary disease, unspecified: Secondary | ICD-10-CM | POA: Diagnosis not present

## 2016-12-11 DIAGNOSIS — Z950 Presence of cardiac pacemaker: Secondary | ICD-10-CM

## 2016-12-11 DIAGNOSIS — I712 Thoracic aortic aneurysm, without rupture: Secondary | ICD-10-CM

## 2016-12-11 DIAGNOSIS — A419 Sepsis, unspecified organism: Principal | ICD-10-CM

## 2016-12-11 DIAGNOSIS — I5030 Unspecified diastolic (congestive) heart failure: Secondary | ICD-10-CM | POA: Diagnosis not present

## 2016-12-11 DIAGNOSIS — I2510 Atherosclerotic heart disease of native coronary artery without angina pectoris: Secondary | ICD-10-CM | POA: Diagnosis present

## 2016-12-11 DIAGNOSIS — N189 Chronic kidney disease, unspecified: Secondary | ICD-10-CM | POA: Diagnosis not present

## 2016-12-11 DIAGNOSIS — Z9012 Acquired absence of left breast and nipple: Secondary | ICD-10-CM | POA: Diagnosis not present

## 2016-12-11 DIAGNOSIS — N183 Chronic kidney disease, stage 3 (moderate): Secondary | ICD-10-CM | POA: Diagnosis not present

## 2016-12-11 DIAGNOSIS — R911 Solitary pulmonary nodule: Secondary | ICD-10-CM | POA: Diagnosis not present

## 2016-12-11 DIAGNOSIS — J441 Chronic obstructive pulmonary disease with (acute) exacerbation: Secondary | ICD-10-CM | POA: Diagnosis present

## 2016-12-11 DIAGNOSIS — N170 Acute kidney failure with tubular necrosis: Secondary | ICD-10-CM | POA: Diagnosis not present

## 2016-12-11 DIAGNOSIS — R05 Cough: Secondary | ICD-10-CM | POA: Diagnosis not present

## 2016-12-11 DIAGNOSIS — F419 Anxiety disorder, unspecified: Secondary | ICD-10-CM | POA: Diagnosis present

## 2016-12-11 DIAGNOSIS — F0631 Mood disorder due to known physiological condition with depressive features: Secondary | ICD-10-CM | POA: Diagnosis not present

## 2016-12-11 DIAGNOSIS — F0632 Mood disorder due to known physiological condition with major depressive-like episode: Secondary | ICD-10-CM | POA: Diagnosis present

## 2016-12-11 DIAGNOSIS — Z95820 Peripheral vascular angioplasty status with implants and grafts: Secondary | ICD-10-CM | POA: Diagnosis not present

## 2016-12-11 DIAGNOSIS — J9600 Acute respiratory failure, unspecified whether with hypoxia or hypercapnia: Secondary | ICD-10-CM | POA: Diagnosis not present

## 2016-12-11 DIAGNOSIS — N182 Chronic kidney disease, stage 2 (mild): Secondary | ICD-10-CM | POA: Diagnosis present

## 2016-12-11 DIAGNOSIS — M170 Bilateral primary osteoarthritis of knee: Secondary | ICD-10-CM | POA: Diagnosis present

## 2016-12-11 DIAGNOSIS — R1312 Dysphagia, oropharyngeal phase: Secondary | ICD-10-CM | POA: Diagnosis present

## 2016-12-11 DIAGNOSIS — I5032 Chronic diastolic (congestive) heart failure: Secondary | ICD-10-CM

## 2016-12-11 DIAGNOSIS — J9620 Acute and chronic respiratory failure, unspecified whether with hypoxia or hypercapnia: Secondary | ICD-10-CM | POA: Diagnosis not present

## 2016-12-11 DIAGNOSIS — I130 Hypertensive heart and chronic kidney disease with heart failure and stage 1 through stage 4 chronic kidney disease, or unspecified chronic kidney disease: Secondary | ICD-10-CM | POA: Diagnosis not present

## 2016-12-11 DIAGNOSIS — J189 Pneumonia, unspecified organism: Secondary | ICD-10-CM | POA: Diagnosis not present

## 2016-12-11 DIAGNOSIS — Z66 Do not resuscitate: Secondary | ICD-10-CM | POA: Diagnosis not present

## 2016-12-11 DIAGNOSIS — I509 Heart failure, unspecified: Secondary | ICD-10-CM | POA: Diagnosis not present

## 2016-12-11 DIAGNOSIS — R54 Age-related physical debility: Secondary | ICD-10-CM | POA: Diagnosis not present

## 2016-12-11 DIAGNOSIS — F329 Major depressive disorder, single episode, unspecified: Secondary | ICD-10-CM | POA: Diagnosis not present

## 2016-12-11 DIAGNOSIS — D72829 Elevated white blood cell count, unspecified: Secondary | ICD-10-CM | POA: Diagnosis present

## 2016-12-11 DIAGNOSIS — Z7189 Other specified counseling: Secondary | ICD-10-CM | POA: Diagnosis not present

## 2016-12-11 DIAGNOSIS — Z515 Encounter for palliative care: Secondary | ICD-10-CM | POA: Diagnosis not present

## 2016-12-11 DIAGNOSIS — M6281 Muscle weakness (generalized): Secondary | ICD-10-CM | POA: Diagnosis not present

## 2016-12-11 DIAGNOSIS — G4733 Obstructive sleep apnea (adult) (pediatric): Secondary | ICD-10-CM | POA: Diagnosis not present

## 2016-12-11 DIAGNOSIS — I5033 Acute on chronic diastolic (congestive) heart failure: Secondary | ICD-10-CM | POA: Diagnosis present

## 2016-12-11 DIAGNOSIS — Z111 Encounter for screening for respiratory tuberculosis: Secondary | ICD-10-CM | POA: Diagnosis not present

## 2016-12-11 DIAGNOSIS — R52 Pain, unspecified: Secondary | ICD-10-CM | POA: Diagnosis not present

## 2016-12-11 DIAGNOSIS — L309 Dermatitis, unspecified: Secondary | ICD-10-CM | POA: Diagnosis present

## 2016-12-11 DIAGNOSIS — Z9981 Dependence on supplemental oxygen: Secondary | ICD-10-CM | POA: Diagnosis not present

## 2016-12-11 DIAGNOSIS — I7121 Aneurysm of the ascending aorta, without rupture: Secondary | ICD-10-CM

## 2016-12-11 DIAGNOSIS — Z23 Encounter for immunization: Secondary | ICD-10-CM | POA: Diagnosis not present

## 2016-12-11 DIAGNOSIS — N179 Acute kidney failure, unspecified: Secondary | ICD-10-CM

## 2016-12-11 DIAGNOSIS — R0902 Hypoxemia: Secondary | ICD-10-CM | POA: Diagnosis not present

## 2016-12-11 DIAGNOSIS — J690 Pneumonitis due to inhalation of food and vomit: Secondary | ICD-10-CM | POA: Diagnosis not present

## 2016-12-11 DIAGNOSIS — Z853 Personal history of malignant neoplasm of breast: Secondary | ICD-10-CM | POA: Diagnosis not present

## 2016-12-11 DIAGNOSIS — R6521 Severe sepsis with septic shock: Secondary | ICD-10-CM | POA: Diagnosis not present

## 2016-12-11 DIAGNOSIS — R6520 Severe sepsis without septic shock: Secondary | ICD-10-CM | POA: Diagnosis not present

## 2016-12-11 DIAGNOSIS — K219 Gastro-esophageal reflux disease without esophagitis: Secondary | ICD-10-CM | POA: Diagnosis present

## 2016-12-11 DIAGNOSIS — Z85828 Personal history of other malignant neoplasm of skin: Secondary | ICD-10-CM | POA: Diagnosis not present

## 2016-12-11 DIAGNOSIS — E1151 Type 2 diabetes mellitus with diabetic peripheral angiopathy without gangrene: Secondary | ICD-10-CM | POA: Diagnosis present

## 2016-12-11 DIAGNOSIS — R1310 Dysphagia, unspecified: Secondary | ICD-10-CM | POA: Diagnosis not present

## 2016-12-11 DIAGNOSIS — I69318 Other symptoms and signs involving cognitive functions following cerebral infarction: Secondary | ICD-10-CM | POA: Diagnosis not present

## 2016-12-11 DIAGNOSIS — I5043 Acute on chronic combined systolic (congestive) and diastolic (congestive) heart failure: Secondary | ICD-10-CM | POA: Diagnosis not present

## 2016-12-11 DIAGNOSIS — J9690 Respiratory failure, unspecified, unspecified whether with hypoxia or hypercapnia: Secondary | ICD-10-CM | POA: Diagnosis not present

## 2016-12-11 DIAGNOSIS — E1122 Type 2 diabetes mellitus with diabetic chronic kidney disease: Secondary | ICD-10-CM | POA: Diagnosis present

## 2016-12-11 DIAGNOSIS — E785 Hyperlipidemia, unspecified: Secondary | ICD-10-CM | POA: Diagnosis not present

## 2016-12-11 DIAGNOSIS — J9621 Acute and chronic respiratory failure with hypoxia: Secondary | ICD-10-CM | POA: Diagnosis not present

## 2016-12-11 DIAGNOSIS — F0150 Vascular dementia without behavioral disturbance: Secondary | ICD-10-CM | POA: Diagnosis present

## 2016-12-11 DIAGNOSIS — R5381 Other malaise: Secondary | ICD-10-CM | POA: Diagnosis present

## 2016-12-11 DIAGNOSIS — J440 Chronic obstructive pulmonary disease with acute lower respiratory infection: Secondary | ICD-10-CM | POA: Diagnosis not present

## 2016-12-11 DIAGNOSIS — R918 Other nonspecific abnormal finding of lung field: Secondary | ICD-10-CM | POA: Diagnosis present

## 2016-12-11 DIAGNOSIS — E119 Type 2 diabetes mellitus without complications: Secondary | ICD-10-CM | POA: Diagnosis not present

## 2016-12-11 DIAGNOSIS — F0390 Unspecified dementia without behavioral disturbance: Secondary | ICD-10-CM | POA: Diagnosis not present

## 2016-12-11 DIAGNOSIS — K5900 Constipation, unspecified: Secondary | ICD-10-CM | POA: Diagnosis not present

## 2016-12-11 HISTORY — DX: Aneurysm of the ascending aorta, without rupture: I71.21

## 2016-12-11 HISTORY — DX: Thoracic aortic aneurysm, without rupture: I71.2

## 2016-12-11 LAB — GLUCOSE, CAPILLARY
GLUCOSE-CAPILLARY: 160 mg/dL — AB (ref 65–99)
Glucose-Capillary: 155 mg/dL — ABNORMAL HIGH (ref 65–99)
Glucose-Capillary: 294 mg/dL — ABNORMAL HIGH (ref 65–99)

## 2016-12-11 LAB — BASIC METABOLIC PANEL
ANION GAP: 12 (ref 5–15)
BUN: 18 mg/dL (ref 6–20)
CHLORIDE: 96 mmol/L — AB (ref 101–111)
CO2: 28 mmol/L (ref 22–32)
Calcium: 9.1 mg/dL (ref 8.9–10.3)
Creatinine, Ser: 1.12 mg/dL — ABNORMAL HIGH (ref 0.44–1.00)
GFR calc non Af Amer: 44 mL/min — ABNORMAL LOW (ref >60)
GFR, EST AFRICAN AMERICAN: 51 mL/min — AB (ref >60)
GLUCOSE: 175 mg/dL — AB (ref 65–99)
POTASSIUM: 3.5 mmol/L (ref 3.5–5.1)
Sodium: 136 mmol/L (ref 135–145)

## 2016-12-11 LAB — MAGNESIUM: MAGNESIUM: 2.2 mg/dL (ref 1.7–2.4)

## 2016-12-11 LAB — PHOSPHORUS: PHOSPHORUS: 2.7 mg/dL (ref 2.5–4.6)

## 2016-12-11 MED ORDER — ORAL CARE MOUTH RINSE: 15.0000 mL | Freq: Two times a day (BID) | OROMUCOSAL | 0 refills | Status: DC

## 2016-12-11 MED ORDER — HALOPERIDOL LACTATE 5 MG/ML IJ SOLN: 0.5000 mg | Freq: Four times a day (QID) | INTRAMUSCULAR | Status: DC | PRN

## 2016-12-11 MED ORDER — POLYETHYLENE GLYCOL 3350 17 G PO PACK: 17.0000 g | PACK | Freq: Two times a day (BID) | ORAL | 0 refills | Status: DC

## 2016-12-11 MED ORDER — HALOPERIDOL LACTATE 5 MG/ML IJ SOLN: 1.0000 mg | Freq: Four times a day (QID) | INTRAMUSCULAR | Status: DC | PRN

## 2016-12-11 MED ORDER — PREDNISONE 20 MG PO TABS: 20.0000 mg | ORAL_TABLET | Freq: Every day | ORAL | Status: DC

## 2016-12-11 MED ORDER — STARCH (THICKENING) PO POWD: ORAL | 0 refills | Status: DC

## 2016-12-11 MED ORDER — POTASSIUM CHLORIDE CRYS ER 20 MEQ PO TBCR
20.0000 meq | EXTENDED_RELEASE_TABLET | Freq: Two times a day (BID) | ORAL | Status: AC
Start: 2016-12-11 — End: ?

## 2016-12-11 MED ORDER — POTASSIUM CHLORIDE CRYS ER 20 MEQ PO TBCR
20.0000 meq | EXTENDED_RELEASE_TABLET | Freq: Two times a day (BID) | ORAL | Status: DC
  Filled 2016-12-11: qty 1

## 2016-12-11 MED ORDER — SENNOSIDES-DOCUSATE SODIUM 8.6-50 MG PO TABS
1.0000 | ORAL_TABLET | Freq: Two times a day (BID) | ORAL | Status: AC
Start: 2016-12-11 — End: ?

## 2016-12-11 MED ORDER — FUROSEMIDE 40 MG PO TABS: 40.0000 mg | ORAL_TABLET | Freq: Two times a day (BID) | ORAL | Status: AC

## 2016-12-11 MED ORDER — DEXTROMETHORPHAN POLISTIREX ER 30 MG/5ML PO SUER: 30.0000 mg | Freq: Two times a day (BID) | ORAL | 0 refills | Status: DC | PRN

## 2016-12-11 MED ORDER — CHLORHEXIDINE GLUCONATE 0.12 % MT SOLN: 15.0000 mL | Freq: Two times a day (BID) | OROMUCOSAL | 0 refills | Status: DC

## 2016-12-11 NOTE — Progress Notes (Signed)
Report called to [facility]. Patient will be ready to transport when [facility] is available.

## 2016-12-11 NOTE — Care Management Note (Signed)
Case Management Note  Patient Details  Name: [name] MRN: [id number] Date of Birth: [birth year]  Subjective/Objective:   Pt admitted with acute on chronic resp failure                Action/Plan:   PTA from [facility] but will need SNF at discharge.  Palliative Care consulted due to cognitive impairment, non compliance and lack of improvement.  CM will continue to follow for discharge needs   Expected Discharge Date:                  Expected Discharge Plan:  [place]  In-House Referral:  Clinical Social Work  Discharge planning Services  CM Consult  Post Acute Care Choice:    Choice offered to:     DME Arranged:    DME Agency:     HH Arranged:    [place] Agency:     Status of Service:     If discussed at [employer], dates discussed:    Additional Comments: Pt to discharge to [facility] today  Discussed in LOS 12/10/16 - pt remains appropriate for continued stay.  Pt continues to require 15L HFNC.  CM spoke with palliative and informed that pt will not be able to discharge to SNF with current 02 requirement.  Per palliative - pt will not consider hospice nor comfort care at this point - conversations ongoing.  CM informed by palliative MD that she has contacted [facility]ia[facility] directly and has given [facility] referral for palliative specific program - attending/palliative have also discussed discharge option/choice with family.  CM will continue to follow for discharge needs Maryclare Labrador, RN 12/11/2016, 12:00 PM

## 2016-12-11 NOTE — Progress Notes (Signed)
Occupational Therapy Treatment Patient Details Name: [name] MRN: [id number] DOB: 1931/11/30 Today's Date: 12/11/2016    History of present illness Patient is an 83 y/o female who presents with cough, SOB and diarrhea. CXR small opacification at Right base. Found to be in septic shock with concerns for aspiration PNA. PMH includes chronic respiratory failure on 2 L oxygen, COPD, asthma, HLD, stroke, GERD, former smoker, [name], s/p of placement, [name], [name], [name], [name], [name].   OT comments  Pt assisted with transfer back to bed - required mod A with sats 86-93% on 15L supplemental 02.  Attempted UB exercise with pt, however, pt too fatigued to participate.    Follow Up Recommendations  SNF;Supervision/Assistance - 24 hour    Equipment Recommendations  None recommended by OT    Recommendations for Other Services      Precautions / Restrictions Precautions Precautions: Fall Precaution Comments: watch 02 Restrictions Weight Bearing Restrictions: No       Mobility Bed Mobility Overal bed mobility: Needs Assistance Bed Mobility: Sit to Supine     Supine to sit: HOB elevated;Mod assist Sit to supine: Min assist   General bed mobility comments: pt requires mod verbal cues for sequencing, assist to lift Rt LE and scoot up in bed   Transfers Overall transfer level: Needs assistance Equipment used: 1 person hand held assist Transfers: Sit to/from [employer] Transfers Sit to Stand: Mod assist Stand pivot transfers: Mod assist       General transfer comment: assist for forward translation of trunk and assist to move into standing     Balance Overall balance assessment: Needs assistance Sitting-balance support: Feet supported;No upper extremity supported Sitting balance-Leahy Scale: Fair Sitting balance - Comments: Able to perform ther ex sittin EOB without LOB.   Standing balance support: During functional  activity;Bilateral upper extremity supported Standing balance-Leahy Scale: Poor Standing balance comment: requires mod A for balance                            ADL either performed or assessed with clinical judgement   ADL Overall ADL's : Needs assistance/impaired                         Toilet Transfer: Moderate assistance;Stand-pivot;Comfort height toilet Toilet Transfer Details (indicate cue type and reason): assist for forward translation and for sit to stand  Toileting- Clothing Manipulation and Hygiene: Total assistance;Sit to/from stand       Functional mobility during ADLs: Moderate assistance General ADL Comments: Pt fatigues quite rapidly with minimal activity      Vision       Perception     Praxis      Cognition Arousal/Alertness: Awake/alert Behavior During Therapy: WFL for tasks assessed/performed Overall Cognitive Status: Impaired/Different from baseline Area of Impairment: Attention;Following commands;Safety/judgement;Problem solving;Orientation                 Orientation Level: Disoriented to;Time;Situation   Memory: Decreased short-term memory Following Commands: Follows one step commands consistently;Follows one step commands with increased time Safety/Judgement: Decreased awareness of safety;Decreased awareness of deficits Awareness: Intellectual Problem Solving: Slow processing;Decreased initiation;Difficulty sequencing;Requires verbal cues;Requires tactile cues          Exercises Exercises: General Lower Extremity;General Upper Extremity General Exercises - Lower Extremity Ankle Circles/Pumps: AROM;Both;Supine;10 reps Quad Sets: AROM;Both;10 reps;Supine Long Arc Quad: AROM;Both;15 reps;Seated Hip Flexion/Marching: AROM;Both;Seated;5 reps  Shoulder Instructions       General Comments Pt on 15L HFNC with sats 86%-93%.  Pt too fatigued to attempt UE exercises     Pertinent Vitals/ Pain       Pain Assessment:  No/denies pain  Home Living                                          Prior Functioning/Environment              Frequency  Min 2X/week        Progress Toward Goals  OT Goals(current goals can now be found in the care plan section)  Progress towards OT goals: Not progressing toward goals - comment (fatigue limits progress this date )     Plan Discharge plan remains appropriate    Co-evaluation                 AM-PAC PT "6 Clicks" Daily Activity     Outcome Measure   Help from another person eating meals?: A Little Help from another person taking care of personal grooming?: A Lot Help from another person toileting, which includes using toliet, bedpan, or urinal?: A Lot Help from another person bathing (including washing, rinsing, drying)?: A Lot Help from another person to put on and taking off regular upper body clothing?: A Lot Help from another person to put on and taking off regular lower body clothing?: Total 6 Click Score: 12    End of Session Equipment Utilized During Treatment: Oxygen  OT Visit Diagnosis: Other abnormalities of gait and mobility (R26.89);Muscle weakness (generalized) (M62.81)   Activity Tolerance Patient limited by fatigue   Patient Left in bed;with call bell/phone within reach;with bed alarm set;with family/visitor present   Nurse Communication Mobility status        Time: 2505-3976 OT Time Calculation (min): 17 min  Charges: OT General Charges $OT Visit: 1 Visit OT Treatments $Therapeutic Activity: 8-22 mins  [facility], OTR/L [phone]    Lucille Passy M 12/11/2016, 11:34 AM

## 2016-12-11 NOTE — Clinical Social Work Note (Signed)
Patient has orders to discharge to [facility] today.  CSW signing off.   Dayton Scrape, [place]

## 2016-12-11 NOTE — Progress Notes (Signed)
Daily Progress Note   Patient Name: [name]       Date: 12/11/2016 DOB: [birth year]  Age: 83 y.o. MRN#: [id number] Attending Physician: Tonye Royalty, MD Primary Care Physician: Hoyt Koch, MD Admit Date: 11/28/2016  Reason for Consultation/Follow-up: Establishing goals of care  Subjective: awake alert Resting in chair Ate about 30% of her breakfast In good spirits Awaiting the arrival of her son from out of town today Has cough, thick secretions, is able to cough them out, also using bedside suction  see below:   Length of Stay: 13  Current Medications: Scheduled Meds:  . apixaban  5 mg Oral BID  . atorvastatin  20 mg Oral Daily  . chlorhexidine  15 mL Mouth Rinse BID  . furosemide  40 mg Oral BID  . insulin aspart  0-9 Units Subcutaneous TID WC  . levalbuterol  0.63 mg Nebulization TID  . loratadine  10 mg Oral Daily  . mouth rinse  15 mL Mouth Rinse q12n4p  . mometasone-formoterol  2 puff Inhalation BID  . montelukast  10 mg Oral QHS  . multivitamin with minerals  1 tablet Oral BID  . polyethylene glycol  17 g Oral BID  . potassium chloride  20 mEq Oral BID  . predniSONE  20 mg Oral Q breakfast  . senna-docusate  1 tablet Oral BID  . tiotropium  18 mcg Inhalation Daily    Continuous Infusions:   PRN Meds: acetaminophen, dextromethorphan, food thickener, guaiFENesin, haloperidol lactate, LORazepam, ondansetron (ZOFRAN) IV, sodium chloride flush, zolpidem  Physical Exam         Weak elderly lady Awake alert Regular breathing,   clear S1 S2 Abdomen soft  Vital Signs: BP (!) 94/44 (BP Location: Left Arm)   Pulse 73   Temp 99 F (37.2 C) (Oral)   Resp 13   Ht 5\' 6"  (1.676 m)   Wt 71.6 kg (157 lb 13.6 oz)   SpO2 92%   BMI 25.48 kg/m    SpO2: SpO2: 92 % O2 Device: O2 Device: High Flow Nasal Cannula O2 Flow Rate: O2 Flow Rate (L/min): 15 L/min  Intake/output summary:   Intake/Output Summary (Last 24 hours) at 12/11/16 1030 Last data filed at 12/11/16 0700  Gross per 24 hour  Intake  360 ml  Output             1100 ml  Net             -740 ml   LBM: Last BM Date: 12/08/16 Baseline Weight: Weight: 73.3 kg (161 lb 9.6 oz) Most recent weight: Weight: 71.6 kg (157 lb 13.6 oz)       Palliative Assessment/Data: PPS 40%   Flowsheet Rows     Most Recent Value  Intake Tab  Referral Department  Hospitalist  Unit at Time of Referral  Intermediate Care Unit  Palliative Care Primary Diagnosis  Pulmonary  Palliative Care Type  New Palliative care  Reason for referral  Clarify Goals of Care  Date first seen by Palliative Care  12/09/16  Clinical Assessment  Palliative Performance Scale Score  40%  Psychosocial & Spiritual Assessment  Palliative Care Outcomes  Patient/Family meeting held?  Yes  Who was at the meeting?  daughter via telephone   Palliative Care Outcomes  Clarified goals of care, Provided psychosocial or spiritual support, Provided end of life care assistance      Patient Active Problem List   Diagnosis Date Noted  . Agitation   . Palliative care by specialist   . Septic shock ([place]) 11/29/2016  . Acute on chronic respiratory failure with hypoxia ([place]) 11/28/2016  . HCAP (healthcare-associated pneumonia) 11/28/2016  . Acute renal failure superimposed on stage 3 chronic kidney disease ([place]) 11/28/2016  . Diarrhea 11/28/2016  . Gait instability 06/22/2016  . Recurrent falls 06/22/2016  . Chronic respiratory failure ([place]) 05/15/2016  . SOB (shortness of breath)   . Community acquired pneumonia 04/25/2016  . Sepsis ([place])   . Rotator cuff tear arthropathy of both shoulders 12/09/2015  . Tremors of nervous system 05/21/2015  . Complete heart block ([place]) 02/13/2015  . Acute blood loss  anemia 01/28/2015  . Constipation 11/07/2014  . OA (osteoarthritis) of hip 10/31/2014  . Chest pain, atypical 06/07/2014  . Osteoporosis 01/10/2014  . Nicotine dependence in remission 06/28/2013  . COPD exacerbation ([place]) 05/29/2013  . GERD (gastroesophageal reflux disease) 07/19/2012  . Depression 06/06/2012  . Secondary hyperparathyroidism ([place]) 04/25/2012  . Gold stage C. COPD with frequent exacerbations 12/15/2011  . Diastolic CHF, chronic ([place]) 12/15/2011  . Goals of care, counseling/discussion 08/27/2011  . History of stroke 02/21/2010  . PACEMAKER, PERMANENT 02/21/2010  . Breast cancer of upper-inner quadrant of left female breast ([place]) 02/03/2010  . Allergic rhinitis 12/31/2009  . Carotid stenosis 08/17/2008  . Controlled diabetes mellitus type 2 with complications ([place]) 32/95/1884  . Obstructive sleep apnea 06/01/2008  . Atrial fibrillation ([place]) 06/01/2008  . [name] 06/01/2008  . Hyperlipidemia 12/30/2006  . [name] (coronary artery disease) 12/30/2006    Palliative Care Assessment & Plan   Patient Profile:    Assessment:  Admitted with cough, shortness of breath, diarrhea Recent hospitalization in February 2018 with sepsis PNA CHF exacerbation ?asp pna COPD exac Recent BIPAP dependency Agitation, ?acute delirium  Recommendations/Plan: Patient still has high O2 requirements, discussed with case management regarding safe disposition options, hence, discussed about LTACH.  Will discuss with children, especially HCPOA daughter later today Goals are palliative in nature, but not comfort only, at this point. Patient desires continuation of current medical therapies, is participating in PT, following SLP recommendations, does not have significant symptom burden from dyspnea overall.   PMT will continue to follow along and  help guide decision making.    Code Status:    Code  Status Orders        Start     Ordered   12/02/16 1909  Do not attempt resuscitation (DNR)   Continuous    Question Answer Comment  In the event of cardiac or respiratory ARREST Do not call a "code blue"   In the event of cardiac or respiratory ARREST Do not perform Intubation, CPR, defibrillation or ACLS   In the event of cardiac or respiratory ARREST Use medication by any route, position, wound care, and other measures to relive pain and suffering. May use oxygen, suction and manual treatment of airway obstruction as needed for comfort.      12/02/16 1908    Code Status History    Date Active Date Inactive Code Status Order ID Comments User Context   11/29/2016  1:06 AM 12/02/2016  7:08 PM Full Code [id number]  Kandice Hams, MD ED   11/28/2016 10:00 PM 11/29/2016  1:06 AM Full Code [id number]  Ivor Costa, MD ED   04/26/2016  7:06 AM 05/03/2016  6:05 PM DNR [id number]  Luz Brazen, MD Inpatient   04/25/2016  4:06 PM 04/26/2016  7:06 AM DNR [id number]  Elwin Mocha, MD ED   04/25/2016  1:43 PM 04/25/2016  4:06 PM Full Code [id number]  Elwin Mocha, MD ED   02/13/2015  4:09 PM 02/13/2015  8:29 PM Full Code [id number]  Deboraha Sprang, MD Inpatient   10/31/2014  2:06 PM 11/03/2014  3:57 PM Full Code [id number]  Gaynelle Arabian, MD Inpatient   10/16/2013 12:09 PM 10/19/2013  4:37 PM Full Code [id number]  Gearlean Alf, MD Inpatient   05/14/2012  8:38 PM 05/17/2012  5:27 PM Full Code [id number]  Etta Quill., DO ED   04/14/2012  5:51 PM 04/27/2012  5:47 PM Full Code [id number]  Iver Nestle, RN Inpatient       Prognosis:   guarded   Discharge Planning:  [facility] for rehab with Palliative care service follow-up  Care plan was discussed with  Patient,case management, Dr Gertie Fey MD, [facility] staff liaison Mr Wynetta Emery.   Thank you for allowing the Palliative Medicine Team to assist in the care of this patient.   Time In: 9.30 Time Out: 10.05 Total Time 35 Prolonged Time Billed no      Greater than 50%  of this time was spent counseling and coordinating care  related to the above assessment and plan.  Loistine Chance, MD [phone]  Please contact Palliative Medicine Team phone at [phone] for questions and concerns.

## 2016-12-11 NOTE — Discharge Instructions (Signed)
Acute Respiratory Failure, Adult Acute respiratory failure occurs when there is not enough oxygen passing from your lungs to your body. When this happens, your lungs have trouble removing carbon dioxide from the blood. This causes your blood oxygen level to drop too low as carbon dioxide builds up. Acute respiratory failure is a medical emergency. It can develop quickly, but it is temporary if treated promptly. Your lung capacity, or how much air your lungs can hold, may improve with time, exercise, and treatment. What are the causes? There are many possible causes of acute respiratory failure, including:  Lung injury.  Chest injury or damage to the ribs or tissues near the lungs.  Lung conditions that affect the flow of air and blood into and out of the lungs, such as pneumonia, acute respiratory distress syndrome, and cystic fibrosis.  Medical conditions, such as strokes or spinal cord injuries, that affect the muscles and nerves that control breathing.  Blood infection (sepsis).  Inflammation of the pancreas (pancreatitis).  A blood clot in the lungs (pulmonary embolism).  A large-volume blood transfusion.  Burns.  Near-drowning.  Seizure.  Smoke inhalation.  Reaction to medicines.  Alcohol or drug overdose.  What increases the risk? This condition is more likely to develop in people who have:  A blocked airway.  Asthma.  A condition or disease that damages or weakens the muscles, nerves, bones, or tissues that are involved in breathing.  A serious infection.  A health problem that blocks the unconscious reflex that is involved in breathing, such as hypothyroidism or sleep apnea.  A lung injury or trauma.  What are the signs or symptoms? Trouble breathing is the main symptom of acute respiratory failure. Symptoms may also include:  Rapid breathing.  Restlessness or anxiety.  Skin, lips, or fingernails that appear blue (cyanosis).  Rapid heart  rate.  Abnormal heart rhythms (arrhythmias).  Confusion or changes in behavior.  Tiredness or loss of energy.  Feeling sleepy or having a loss of consciousness.  How is this diagnosed? Your health care provider can diagnose acute respiratory failure with a medical history and physical exam. During the exam, your health care provider will listen to your heart and check for crackling or wheezing sounds in your lungs. Your may also have tests to confirm the diagnosis and determine what is causing respiratory failure. These tests may include:  Measuring the amount of oxygen in your blood (pulse oximetry). The measurement comes from a small device that is placed on your finger, earlobe, or toe.  Other blood tests to measure blood gases and to look for signs of infection.  Sampling your cerebral spinal fluid or tracheal fluid to check for infections.  Chest X-ray to look for fluid in spaces that should be filled with air.  Electrocardiogram (ECG) to look at the heart's electrical activity.  How is this treated? Treatment for this condition usually takes places in a hospital intensive care unit (ICU). Treatment depends on what is causing the condition. It may include one or more treatments until your symptoms improve. Treatment may include:  Supplemental oxygen. Extra oxygen is given through a tube in the nose, a face mask, or a hood.  A device such as a continuous positive airway pressure (CPAP) or bi-level positive airway pressure (BiPAP or BPAP) machine. This treatment uses mild air pressure to keep the airways open. A mask or other device will be placed over your nose or mouth. A tube that is connected to a motor will deliver  oxygen through the mask.  Ventilator. This treatment helps move air into and out of the lungs. This may be done with a bag and mask or a machine. For this treatment, a tube is placed in your windpipe (trachea) so air and oxygen can flow to the lungs.  Extracorporeal  membrane oxygenation (ECMO). This treatment temporarily takes over the function of the heart and lungs, supplying oxygen and removing carbon dioxide. ECMO gives the lungs a chance to recover. It may be used if a ventilator is not effective.  Tracheostomy. This is a procedure that creates a hole in the neck to insert a breathing tube.  Receiving fluids and medicines.  Rocking the bed to help breathing.  Follow these instructions at home:  Take over-the-counter and prescription medicines only as told by your health care provider.  Return to normal activities as told by your health care provider. Ask your health care provider what activities are safe for you.  Keep all follow-up visits as told by your health care provider. This is important. How is this prevented? Treating infections and medical conditions that may lead to acute respiratory failure can help prevent the condition from developing. Contact a health care provider if:  You have a fever.  Your symptoms do not improve or they get worse. Get help right away if:  You are having trouble breathing.  You lose consciousness.  Your have cyanosis or turn blue.  You develop a rapid heart rate.  You are confused. These symptoms may represent a serious problem that is an emergency. Do not wait to see if the symptoms will go away. Get medical help right away. Call your local emergency services (911 in the [place] Do not drive yourself to the hospital. This information is not intended to replace advice given to you by your health care provider. Make sure you discuss any questions you have with your health care provider. Document Released: 02/28/2013 Document Revised: 09/21/2015 Document Reviewed: 09/11/2015 Elsevier Interactive Patient Education  [employer].

## 2016-12-11 NOTE — Discharge Summary (Signed)
Physician Discharge Summary  [name] HQP:[id number] DOB: 1931/05/11 DOA: 11/28/2016  PCP: Hoyt Koch, MD  Admit date: 11/28/2016 Discharge date: 12/11/2016  Admitted From: Home Discharge disposition: LTAC [facility]   Recommendations for Outpatient Follow-Up:   1. Continue to monitor fluid balance closely and adjust Lasix as needed. 2. Consider repeat CT of the chest in 3 months to ensure home and her nodule noted on imaging here is stable. 3. Needs follow-up surveillance of ascending aortic aneurysm in one year.   Discharge Diagnosis:   Principal Problem:   Acute on chronic respiratory failure with hypoxia (HCC) Active Problems:   Controlled diabetes mellitus type 2 with complications (HCC)   Hyperlipidemia   [name] (coronary artery disease)   Atrial fibrillation (HCC)   PACEMAKER, PERMANENT   Diastolic CHF, chronic (HCC)   GERD (gastroesophageal reflux disease)   COPD exacerbation (HCC)   Sepsis ([place])   HCAP (healthcare-associated pneumonia)   Acute renal failure superimposed on stage 3 chronic kidney disease (HCC)   Diarrhea   Septic shock (HCC)   Agitation   Palliative care by specialist   Pulmonary nodule    Discharge Condition: Improved.  Diet recommendation: Dysphagia 2 with nectar thickened liquids. Low sodium, heart healthy.  Carbohydrate-modified.    History of Present Illness:   [name] is an 83 y.o. female with past medical history of acute on chronic respiratory failure, COPD on home oxygen 2L , OSA, diastolic CHF, [name], HLD, stroke, GERD, [name], CKD stage 3, [name], pacemaker, and breast cancer admitted on 9/22/2018with cough, shortness of breath, and diarrhea. Patient found to have respiratory distress and SNF and hypoxic with oxygen saturations low 80's. In ED, patient with hypotension, WBC's 23.5, lactic acid 2.53, BNP 156.7, ABG with pH of 7.376, CO2 29 and PO2 110. Chest xray revealed possible right  basal infiltration. BiPAP initiated. Treated for aspiration pneumonia and zosyn x8 days. Patient remains in SDU due to high oxygen requirements--HFNC and BiPAP prn/HS.   Hospital Course by Problem:   Principal Problems: Acute on Chronic Systolic CHF (last ZL=93%)/TT node Dysfunction(unknown base weight)/bilateral pleural effusions Patient is status post pacemaker and has been diuresed throughout her hospital stay. A proximally 9 L out since admission. Weight up slightly this morning. Lasix was increased to 40 mg twice a day on 12/10/16. Chest x-ray repeated 12/09/16 and essentially unchanged from prior.  Septic Shock secondary to aspiration pneumonia/dysphasia Resolved. Completed 8 days of therapy with Zosyn. Continue dysphagia 2 diet. Speech therapy following. Maintain aspiration precautions, nectar thickened liquids.  Acute on chronic Respiratory Failure with Hypoxia//COPD exacerbation Continues to require a higher oxygen requirement and she uses at home (2 L nasal cannula). Alternates between needing BiPAP, nonrebreather mask and high flow nasal cannula oxygen. Continue flutter valve, prednisone, Spiriva, Singulair, Dulera, Xopenex nebs. Continue pulmonary toilet and incentive spirometry. Oxygen saturations 90-95 percent.  Active problems: Ascending aortic aneurysm 4.1 cm on imaging. Annual imaging recommended for follow-up.  Paroxysmal A-fib  Has a pacemaker. Heart rate controlled in the 70s. Continue Eliquis.  Agitation Continue Haldol as needed.  Diabetes type 2 controlled with renal complication  Hemoglobin A1c is 7% indicating good outpatient control. Currently being managed with insulin sensitive SSI 3 times a day. CBGs well-controlled at 94-127. Okay to resume metformin at discharge.  Acute renal failure/underlying stage III chronic kidney disease Creatinine stable at 1.12.  Hypokalemia Potassium goal> 4. Will supplement today given potassium  3.5.   Hypomagnesemia Magnesium goal>2,  currently 2.2.   Hypophosphatemia Phosphorus WNL today.  Hyperlipidemia Continue Lipitor.    Medical Consultants:    Critical Care  Palliative Care   Discharge Exam:   Vitals:   12/11/16 0945 12/11/16 1157  BP:  (!) 104/49  Pulse:    Resp:    Temp:  98.1 F (36.7 C)  SpO2: 92%    Vitals:   12/11/16 0737 12/11/16 0800 12/11/16 0945 12/11/16 1157  BP:  (!) 94/44  (!) 104/49  Pulse:      Resp:      Temp:  99 F (37.2 C)  98.1 F (36.7 C)  TempSrc:  Oral  Axillary  SpO2: 90% (!) 88% 92%   Weight:      Height:        General exam: Appears calm and comfortable. Sitting up in chair. Appears weak. Respiratory system: Clear to auscultation. Respiratory effort mildly labored. Cardiovascular system: S1 & S2 heard, RRR. No JVD,  rubs, gallops or clicks. No murmurs. Gastrointestinal system: Abdomen is nondistended, soft and nontender. No organomegaly or masses felt. Normal bowel sounds heard. Central nervous system: Alert and oriented. No focal neurological deficits. Extremities: No clubbing,  or cyanosis. No edema. Skin: Significant ecchymoses to the upper extremities. Psychiatry: Judgement and insight appear normal. Mood & affect appropriate.    The results of significant diagnostics from this hospitalization (including imaging, microbiology, ancillary and laboratory) are listed below for reference.     Procedures and Diagnostic Studies:   Ct Chest Wo Contrast  Result Date: 11/29/2016 CLINICAL DATA:  COPD exacerbation short of breath EXAM: CT CHEST WITHOUT CONTRAST TECHNIQUE: Multidetector CT imaging of the chest was performed following the standard protocol without IV contrast. COMPARISON:  Radiograph 11/28/2016, CT chest 07/14/2010 FINDINGS: Cardiovascular: Limited evaluation without intravenous contrast. Mild aneurysmal dilatation of the ascending segment up to 4.1 cm. Dense atherosclerotic calcifications. Coronary  artery calcification. Mitral valvular calcification. Intracardiac pacing leads. Borderline cardiomegaly. No large pericardial effusion. Mediastinum/Nodes: Multiple enlarged mediastinal lymph nodes. Pretracheal lymph node measures 11 mm. Midline trachea. Thyroid is normal. Esophagus within normal limits. Lungs/Pleura: Moderate emphysema. Right apical parenchymal scarring. Trace pleural effusions. Ground-glass density and consolidation in the right lower lobe is suspicious for a pneumonia. Small foci of consolidation in the posterior left lung base may reflect additional foci of pneumonia. Development of more nodular appearing density within the apical portion of the left upper lobe measuring 15 x 15 mm. Upper Abdomen: No acute abnormality. Musculoskeletal: Degenerative changes. No acute or suspicious bone lesion. IMPRESSION: 1. Moderate emphysema. Ground-glass density and moderate consolidation in the right lower lobe is most consistent with a pneumonia. Imaging follow-up to resolution is recommended. There are tiny pleural effusions and small infiltrates also noted in the left lung base. 2. 15 mm nodular density in the left apical lung, could represent additional infiltrate although lung nodule is also a concern. Consider one of the following in 3 months for both low-risk and high-risk individuals: (a) repeat chest CT, (b) follow-up PET-CT, or (c) tissue sampling. This recommendation follows the consensus statement: Guidelines for Management of Incidental Pulmonary Nodules Detected on CT Images: From the Fleischner Society 2017; Radiology 2017; 284:228-243. 3. Mediastinal adenopathy, likely reactive 4. Mild aneurysmal dilatation of the ascending aorta up to 4.1 cm. Recommend annual imaging followup by CTA or MRA. This recommendation follows 2010 ACCF/AHA/AATS/ACR/ASA/SCA/SCAI/SIR/STS/SVM Guidelines for the Diagnosis and Management of Patients with Thoracic Aortic Disease. Circulation. 2010; 121: R518-A416 Aortic  Atherosclerosis (ICD10-I70.0) and Emphysema (ICD10-J43.9). Electronically Signed  By: Donavan Foil M.D.   On: 11/29/2016 02:27   Dg Chest Portable 1 View  Result Date: 11/28/2016 CLINICAL DATA:  Hypoxia. EXAM: PORTABLE CHEST 1 VIEW COMPARISON:  08/11/2016 and 04/25/2016 FINDINGS: Left-sided pacemaker unchanged. Lungs are adequately inflated demonstrate mild hazy opacification over the right base slightly worse. Mild stable chronic interstitial prominence bilaterally. Cardiomediastinal silhouette is within normal. Mild degenerate change of the spine and shoulders. IMPRESSION: Mild opacification over the right base which may be due to atelectasis or infection. Chronic stable interstitial prominence. Electronically Signed   By: Marin Olp M.D.   On: 11/28/2016 18:20     Labs:   Basic Metabolic Panel:  Recent Labs Lab 12/07/16 0214 12/08/16 0131 12/09/16 0252 12/10/16 1208 12/11/16 0413  NA 141 139 137 141 136  K 4.6 3.4* 3.8 5.4* 3.5  CL 102 100* 99* 102 96*  CO2 30 29 29 24 28   GLUCOSE 178* 153* 140* 90 175*  BUN 28* 23* 21* 20 18  CREATININE 1.27* 1.10* 1.20* 1.18* 1.12*  CALCIUM 9.6 9.2 9.2 9.2 9.1  MG  --   --   --  2.6* 2.2  PHOS  --   --   --   --  2.7   GFR Estimated Creatinine Clearance: 38.6 mL/min (A) (by C-G formula based on SCr of 1.12 mg/dL (H)). Liver Function Tests:  Recent Labs Lab 12/05/16 0307  AST 20  ALT 12*  ALKPHOS 67  BILITOT 0.9  PROT 7.9  ALBUMIN 2.9*   CBC:  Recent Labs Lab 12/05/16 0307 12/10/16 2150  WBC 13.7* 19.7*  HGB 13.3 13.3  HCT 40.8 41.7  MCV 85.2 85.6  PLT 364 240   CBG:  Recent Labs Lab 12/10/16 0754 12/10/16 1242 12/10/16 1732 12/10/16 2117 12/11/16 0807  GLUCAP 94 100* 127* 113* 160*     Discharge Instructions:   Discharge Instructions    (HEART FAILURE PATIENTS) Call MD:  Anytime you have any of the following symptoms: 1) 3 pound weight gain in 24 hours or 5 pounds in 1 week 2) shortness of breath, with  or without a dry hacking cough 3) swelling in the hands, feet or stomach 4) if you have to sleep on extra pillows at night in order to breathe.    Complete by:  As directed    Call MD for:  difficulty breathing, headache or visual disturbances    Complete by:  As directed    Call MD for:  extreme fatigue    Complete by:  As directed    Call MD for:  severe uncontrolled pain    Complete by:  As directed    Call MD for:  temperature >100.4    Complete by:  As directed    Diet - low sodium heart healthy    Complete by:  As directed    Diet Carb Modified    Complete by:  As directed    Increase activity slowly    Complete by:  As directed      Allergies as of 12/11/2016      Reactions   Cholestatin Other (See Comments)   RAGWEED SEASON...sneezing   Sulfur Itching      Medication List    STOP taking these medications   levofloxacin 500 MG tablet Commonly known as:  LEVAQUIN   Potassium Chloride ER 20 MEQ Tbcr     TAKE these medications   acetaminophen 325 MG tablet Commonly known as:  TYLENOL Take 650 mg by  mouth every 6 (six) hours as needed for mild pain or fever.   ADVAIR DISKUS 250-50 MCG/DOSE Aepb Generic drug:  Fluticasone-Salmeterol INHALE 1 PUFF TWICE DAILY  (30  DAY  EXPIRATION)   AMITIZA 24 MCG capsule Generic drug:  lubiprostone TAKE 1 CAPSULE TWICE DAILY WITH A MEAL   atorvastatin 20 MG tablet Commonly known as:  LIPITOR TAKE 1 TABLET EVERY DAY   B-D ULTRA-FINE 33 LANCETS Misc Use to help check blood sugars daily Dx E11.9   BIOTIN 5000 5 MG Caps Generic drug:  Biotin Take 5 mg by mouth at bedtime.   chlorhexidine 0.12 % solution Commonly known as:  PERIDEX 15 mLs by Mouth Rinse route 2 (two) times daily.   dextromethorphan 30 MG/5ML liquid Commonly known as:  DELSYM Take 5 mLs (30 mg total) by mouth 2 (two) times daily as needed for cough.   ELIQUIS 5 MG Tabs tablet Generic drug:  apixaban TAKE 1 TABLET TWICE DAILY  (MUST  ESTABLISH  NEW   PRIMARY CARE PROVIDER  FOR FUTURE REFILLS)   food thickener Powd Commonly known as:  THICK IT Thicken to nectar consistency   furosemide 40 MG tablet Commonly known as:  LASIX Take 1 tablet (40 mg total) by mouth 2 (two) times daily. What changed:  See the new instructions.   glucose blood test strip Commonly known as:  BAYER CONTOUR TEST 1 each by Other route daily. Use to check blood sugars every day Dx E11.9   levalbuterol 0.63 MG/3ML nebulizer solution Commonly known as:  XOPENEX Take 0.63 mg by nebulization every 4 (four) hours as needed for wheezing or shortness of breath.   levalbuterol 45 MCG/ACT inhaler Commonly known as:  XOPENEX HFA Inhale 2 puffs into the lungs every 6 (six) hours as needed. Reported on 09/23/2015   metFORMIN 500 MG tablet Commonly known as:  GLUCOPHAGE TAKE 1 TABLET TWICE DAILY WITH A MEAL   montelukast 10 MG tablet Commonly known as:  SINGULAIR TAKE ONE TABLET AT BEDTIME   mouth rinse Liqd solution 15 mLs by Mouth Rinse route 2 times daily at 12 noon and 4 pm.   MUCINEX 600 MG 12 hr tablet Generic drug:  guaiFENesin TAKE ONE TABLET TWICE DAILY   polyethylene glycol packet Commonly known as:  MIRALAX / GLYCOLAX Take 17 g by mouth 2 (two) times daily.   potassium chloride SA 20 MEQ tablet Commonly known as:  K-DUR,KLOR-CON Take 1 tablet (20 mEq total) by mouth 2 (two) times daily.   predniSONE 20 MG tablet Commonly known as:  DELTASONE Take 1 tablet (20 mg total) by mouth daily with breakfast.   senna-docusate 8.6-50 MG tablet Commonly known as:  Senokot-S Take 1 tablet by mouth 2 (two) times daily.   SPIRIVA HANDIHALER 18 MCG inhalation capsule Generic drug:  tiotropium INHALE THE CONTENTS OF 1 CAPSULE EVERY DAY         Time coordinating discharge: 35 minutes.  Signed:  Chassidy Layson  Pager [phone] [facility] 12/11/2016, 12:54 PM

## 2016-12-11 NOTE — Progress Notes (Signed)
Physical Therapy Treatment Patient Details Name: [name] MRN: [id number] DOB: 19-Aug-1931 Today's Date: 12/11/2016    History of Present Illness Patient is an 83 y/o female who presents with cough, SOB and diarrhea. CXR small opacification at Right base. Found to be in septic shock with concerns for aspiration PNA. PMH includes chronic respiratory failure on 2 L oxygen, COPD, asthma, HLD, stroke, GERD, former smoker, [name], s/p of placement, [name], [name], [name], [name], [name].    PT Comments    Pt admitted with above diagnosis. Pt currently with functional limitations due to the deficits listed below (see PT Problem List). Pt was able to transfer to chair with mod assist.  Participated in some exercise as well.  Sats better once pt in chair.  Will continue to progress as pt able.   Pt will benefit from skilled PT to increase their independence and safety with mobility to allow discharge to the venue listed below.     Follow Up Recommendations  SNF     Equipment Recommendations  None recommended by PT    Recommendations for Other Services OT consult     Precautions / Restrictions Precautions Precautions: Fall Precaution Comments: watch 02 Restrictions Weight Bearing Restrictions: No    Mobility  Bed Mobility Overal bed mobility: Needs Assistance Bed Mobility: Supine to Sit     Supine to sit: HOB elevated;Mod assist Sit to supine: Mod assist   General bed mobility comments: Mod assist for lifting trunk from bed.   Transfers Overall transfer level: Needs assistance Equipment used: 2 person hand held assist Transfers: Sit to/from [facility] Sit to Stand: Mod assist Stand pivot transfers: Mod assist       General transfer comment: Mod lifting assist to power into standing.  mod assist for pt to pivot around needing assist to weight shift and to assist with anterior lean as pt leaning significantly posterior at  times.    Ambulation/Gait                 Stairs            Wheelchair Mobility    Modified Rankin (Stroke Patients Only)       Balance Overall balance assessment: Needs assistance Sitting-balance support: Feet supported;No upper extremity supported Sitting balance-Leahy Scale: Good Sitting balance - Comments: Able to perform ther ex sittin EOB without LOB.   Standing balance support: During functional activity;Bilateral upper extremity supported Standing balance-Leahy Scale: Poor Standing balance comment: UE support and external assistance.                             Cognition Arousal/Alertness: Awake/alert Behavior During Therapy: WFL for tasks assessed/performed Overall Cognitive Status: Impaired/Different from baseline Area of Impairment: Safety/judgement;Awareness;Following commands;Memory;Problem solving;Orientation                 Orientation Level: Disoriented to;Time;Situation   Memory: Decreased short-term memory Following Commands: Follows multi-step commands inconsistently Safety/Judgement: Decreased awareness of safety;Decreased awareness of deficits Awareness: Intellectual Problem Solving: Slow processing        Exercises General Exercises - Lower Extremity Ankle Circles/Pumps: AROM;Both;Supine;10 reps Quad Sets: AROM;Both;10 reps;Supine Long Arc Quad: AROM;Both;15 reps;Seated Hip Flexion/Marching: AROM;Both;Seated;5 reps    General Comments General comments (skin integrity, edema, etc.): On 15 LHFNC with sats 88% on Arrival.  Once in chair sats 90-94%.  BP intially 94/44.  Dropped to 87/54 sitting EOB and then to  78/45 once in chair but after 2 min, BP recovered to 99/42.  HR stable throughout.  Pt wanted drink therefore thickened glucerna and observed pt drink some as well as pt ate applesauce.  moved tray after pt finished as she requires full supervision for meals.       Pertinent Vitals/Pain Pain Assessment:  No/denies pain    Home Living                      Prior Function            PT Goals (current goals can now be found in the care plan section) Progress towards PT goals: Progressing toward goals    Frequency    Min 3X/week      PT Plan Current plan remains appropriate    Co-evaluation              AM-PAC PT "6 Clicks" Daily Activity  Outcome Measure  Difficulty turning over in bed (including adjusting bedclothes, sheets and blankets)?: Unable Difficulty moving from lying on back to sitting on the side of the bed? : Unable Difficulty sitting down on and standing up from a chair with arms (e.g., wheelchair, bedside commode, etc,.)?: A Lot Help needed moving to and from a bed to chair (including a wheelchair)?: A Lot Help needed walking in hospital room?: Total Help needed climbing 3-5 steps with a railing? : Total 6 Click Score: 8    End of Session Equipment Utilized During Treatment: Oxygen;Gait belt Activity Tolerance: Patient limited by fatigue Patient left: with call bell/phone within reach;in chair;with chair alarm set Nurse Communication: Mobility status;Need for lift equipment (May need STedy to get pt back to bed.) PT Visit Diagnosis: Muscle weakness (generalized) (M62.81);Other (comment);Unsteadiness on feet (R26.81) (low SpO2)     Time: 6767-2094 PT Time Calculation (min) (ACUTE ONLY): 39 min  Charges:  $Therapeutic Exercise: 8-22 mins $Therapeutic Activity: 8-22 mins $Self Care/Home Management: 8-22                    G Codes:       Carbon Gentle Hoge,PT Acute Rehabilitation [phone] (pager)    Denice Paradise 12/11/2016, 10:04 AM

## 2016-12-11 NOTE — Progress Notes (Signed)
[facility]. Patient's family was present. Patient's vitals are stable. Patient belongs were taken and transported with family.

## 2016-12-11 NOTE — Progress Notes (Addendum)
  Speech Language Pathology Treatment: Dysphagia  Patient Details Name: [name] MRN: [id number] DOB: [birth year] Today's Date: 12/11/2016 Time: 1202-1227 SLP Time Calculation (min) (ACUTE ONLY): 25 min  Assessment / Plan / Recommendation Clinical Impression  Patient seen for dysphagia treatment, reassessment of thin liquids at bedside. RN reports pt coughing with purees this morning, refusing medications in applesauce. Assisted pt with oral care, cleaning dentures; removed stringy white secretions from palate. Of note pt does not have dental adhesive; dentition is ill-fitting. Provided upgraded trials of thin liquids for diagnostic treatment; pt exhibits throat clearing, delayed cough with cup sips, suggestive of reduced airway protection. No overt signs of aspiration noted with nectar thick liquids via cup or straw, pureed solids. Pt requires extended time for mastication of regular cracker, cough x1. Pt noted with residue along palate/gumline behind dentures with removal. Recommend she continue dys 2 with nectar thick liquids to liberalize choices, however purees may be easier for pt to consume given her ill-fitting dentition and depending on her respiratory status at mealtimes. No family present; pt may benefit if family could bring dental adhesive from home.  Can attempt medications whole with nectar thick liquid if pt prefers vs applesauce. Pt expresses desire to drink thin liquids. Palliative medicine following; goals are not full comfort at this time. No family present today. Will follow up for goals of care and pt/family education; consider repeat MBS, free water protocol or advancement of liquids at bedside for comfort/quality of life.    HPI HPI: [name], COPD, asthma, hyperlipidemia, stroke, GERD, formersmoker, [name], s/p ofplacement, [name], [name], [name], [name], atrial fibrillation  on Eliquis, who presented with cough, shortness breath, and diarrhea. Pt with acute exacerbation of COPD and CT Chest suggests RLL PNA. Patient is known to [place] with previous MBS dated 04/27/2016 which did show penetration/aspiration given large or serial sips of thin liquids.      SLP Plan  Continue with current plan of care       Recommendations  Diet recommendations: Dysphagia 2 (fine chop);Nectar-thick liquid Liquids provided via: Cup;No straw Medication Administration: Whole meds with liquid Supervision: Patient able to self feed;Full supervision/cueing for compensatory strategies Compensations: Minimize environmental distractions;Slow rate;Small sips/bites Postural Changes and/or Swallow Maneuvers: Seated upright 90 degrees                Oral Care Recommendations: Oral care BID Follow up Recommendations: Skilled Nursing facility;24 hour supervision/assistance SLP Visit Diagnosis: Dysphagia, oropharyngeal phase (R13.12) Plan: Continue with current plan of care       [place], [place], [place] Pathologist [phone]  [name] 12/11/2016, 1:00 PM

## 2016-12-11 NOTE — Progress Notes (Signed)
Placed patient on home trilogy bipap for the night via AVAPS mode. Oxygen was set at 2lpm.

## 2016-12-11 NOTE — Plan of Care (Signed)
Problem: Physical Regulation: Goal: Ability to maintain clinical measurements within normal limits will improve Outcome: Progressing Still on high flow o2  Problem: Fluid Volume: Goal: Ability to maintain a balanced intake and output will improve Outcome: Progressing Lasix and albumin  Comments: Overall maintaining respiratory status. pallative discussions ongoing

## 2016-12-11 NOTE — Progress Notes (Signed)
PT refused to wear Bipap.  RT will continue to monitor.

## 2016-12-16 ENCOUNTER — Encounter: Payer: Self-pay | Admitting: Internal Medicine

## 2016-12-25 DIAGNOSIS — F338 Other recurrent depressive disorders: Secondary | ICD-10-CM | POA: Diagnosis not present

## 2016-12-25 DIAGNOSIS — E785 Hyperlipidemia, unspecified: Secondary | ICD-10-CM | POA: Diagnosis not present

## 2016-12-25 DIAGNOSIS — I739 Peripheral vascular disease, unspecified: Secondary | ICD-10-CM | POA: Diagnosis not present

## 2016-12-25 DIAGNOSIS — R52 Pain, unspecified: Secondary | ICD-10-CM | POA: Diagnosis not present

## 2016-12-25 DIAGNOSIS — R1312 Dysphagia, oropharyngeal phase: Secondary | ICD-10-CM | POA: Diagnosis not present

## 2016-12-25 DIAGNOSIS — I2510 Atherosclerotic heart disease of native coronary artery without angina pectoris: Secondary | ICD-10-CM | POA: Diagnosis not present

## 2016-12-25 DIAGNOSIS — E569 Vitamin deficiency, unspecified: Secondary | ICD-10-CM | POA: Diagnosis not present

## 2016-12-25 DIAGNOSIS — E119 Type 2 diabetes mellitus without complications: Secondary | ICD-10-CM | POA: Diagnosis not present

## 2016-12-25 DIAGNOSIS — F419 Anxiety disorder, unspecified: Secondary | ICD-10-CM | POA: Diagnosis not present

## 2016-12-25 DIAGNOSIS — F0390 Unspecified dementia without behavioral disturbance: Secondary | ICD-10-CM | POA: Diagnosis not present

## 2016-12-25 DIAGNOSIS — J189 Pneumonia, unspecified organism: Secondary | ICD-10-CM | POA: Diagnosis not present

## 2016-12-25 DIAGNOSIS — J9621 Acute and chronic respiratory failure with hypoxia: Secondary | ICD-10-CM | POA: Diagnosis not present

## 2016-12-25 DIAGNOSIS — R1310 Dysphagia, unspecified: Secondary | ICD-10-CM | POA: Diagnosis not present

## 2016-12-25 DIAGNOSIS — J9600 Acute respiratory failure, unspecified whether with hypoxia or hypercapnia: Secondary | ICD-10-CM | POA: Diagnosis not present

## 2016-12-25 DIAGNOSIS — Z8673 Personal history of transient ischemic attack (TIA), and cerebral infarction without residual deficits: Secondary | ICD-10-CM | POA: Diagnosis not present

## 2016-12-25 DIAGNOSIS — Z950 Presence of cardiac pacemaker: Secondary | ICD-10-CM | POA: Diagnosis not present

## 2016-12-25 DIAGNOSIS — M6281 Muscle weakness (generalized): Secondary | ICD-10-CM | POA: Diagnosis not present

## 2016-12-25 DIAGNOSIS — J9620 Acute and chronic respiratory failure, unspecified whether with hypoxia or hypercapnia: Secondary | ICD-10-CM | POA: Diagnosis not present

## 2016-12-25 DIAGNOSIS — R54 Age-related physical debility: Secondary | ICD-10-CM | POA: Diagnosis not present

## 2016-12-25 DIAGNOSIS — K5900 Constipation, unspecified: Secondary | ICD-10-CM | POA: Diagnosis not present

## 2016-12-25 DIAGNOSIS — R6520 Severe sepsis without septic shock: Secondary | ICD-10-CM | POA: Diagnosis not present

## 2016-12-25 DIAGNOSIS — R05 Cough: Secondary | ICD-10-CM | POA: Diagnosis not present

## 2016-12-25 DIAGNOSIS — F329 Major depressive disorder, single episode, unspecified: Secondary | ICD-10-CM | POA: Diagnosis not present

## 2016-12-25 DIAGNOSIS — R918 Other nonspecific abnormal finding of lung field: Secondary | ICD-10-CM | POA: Diagnosis not present

## 2016-12-25 DIAGNOSIS — J441 Chronic obstructive pulmonary disease with (acute) exacerbation: Secondary | ICD-10-CM | POA: Diagnosis not present

## 2016-12-25 DIAGNOSIS — R0902 Hypoxemia: Secondary | ICD-10-CM | POA: Diagnosis not present

## 2016-12-25 DIAGNOSIS — R262 Difficulty in walking, not elsewhere classified: Secondary | ICD-10-CM | POA: Diagnosis not present

## 2016-12-25 DIAGNOSIS — I4891 Unspecified atrial fibrillation: Secondary | ICD-10-CM | POA: Diagnosis not present

## 2016-12-25 DIAGNOSIS — Z111 Encounter for screening for respiratory tuberculosis: Secondary | ICD-10-CM | POA: Diagnosis not present

## 2016-12-25 DIAGNOSIS — I5030 Unspecified diastolic (congestive) heart failure: Secondary | ICD-10-CM | POA: Diagnosis not present

## 2016-12-28 DIAGNOSIS — R52 Pain, unspecified: Secondary | ICD-10-CM | POA: Diagnosis not present

## 2016-12-28 DIAGNOSIS — J441 Chronic obstructive pulmonary disease with (acute) exacerbation: Secondary | ICD-10-CM | POA: Diagnosis not present

## 2016-12-28 DIAGNOSIS — E119 Type 2 diabetes mellitus without complications: Secondary | ICD-10-CM | POA: Diagnosis not present

## 2016-12-28 DIAGNOSIS — I5030 Unspecified diastolic (congestive) heart failure: Secondary | ICD-10-CM | POA: Diagnosis not present

## 2016-12-28 DIAGNOSIS — I739 Peripheral vascular disease, unspecified: Secondary | ICD-10-CM | POA: Diagnosis not present

## 2016-12-28 DIAGNOSIS — M6281 Muscle weakness (generalized): Secondary | ICD-10-CM | POA: Diagnosis not present

## 2016-12-28 DIAGNOSIS — J189 Pneumonia, unspecified organism: Secondary | ICD-10-CM | POA: Diagnosis not present

## 2016-12-28 DIAGNOSIS — I4891 Unspecified atrial fibrillation: Secondary | ICD-10-CM | POA: Diagnosis not present

## 2016-12-28 DIAGNOSIS — I2510 Atherosclerotic heart disease of native coronary artery without angina pectoris: Secondary | ICD-10-CM | POA: Diagnosis not present

## 2016-12-28 DIAGNOSIS — J9621 Acute and chronic respiratory failure with hypoxia: Secondary | ICD-10-CM | POA: Diagnosis not present

## 2016-12-28 DIAGNOSIS — Z8673 Personal history of transient ischemic attack (TIA), and cerebral infarction without residual deficits: Secondary | ICD-10-CM | POA: Diagnosis not present

## 2016-12-28 DIAGNOSIS — E785 Hyperlipidemia, unspecified: Secondary | ICD-10-CM | POA: Diagnosis not present

## 2016-12-29 ENCOUNTER — Telehealth: Payer: Self-pay | Admitting: Pulmonary Disease

## 2016-12-29 DIAGNOSIS — J9621 Acute and chronic respiratory failure with hypoxia: Secondary | ICD-10-CM | POA: Diagnosis not present

## 2016-12-29 DIAGNOSIS — I5030 Unspecified diastolic (congestive) heart failure: Secondary | ICD-10-CM | POA: Diagnosis not present

## 2016-12-29 DIAGNOSIS — J441 Chronic obstructive pulmonary disease with (acute) exacerbation: Secondary | ICD-10-CM | POA: Diagnosis not present

## 2016-12-29 DIAGNOSIS — E119 Type 2 diabetes mellitus without complications: Secondary | ICD-10-CM | POA: Diagnosis not present

## 2016-12-29 DIAGNOSIS — I2510 Atherosclerotic heart disease of native coronary artery without angina pectoris: Secondary | ICD-10-CM | POA: Diagnosis not present

## 2016-12-29 DIAGNOSIS — R1310 Dysphagia, unspecified: Secondary | ICD-10-CM | POA: Diagnosis not present

## 2016-12-29 DIAGNOSIS — Z8673 Personal history of transient ischemic attack (TIA), and cerebral infarction without residual deficits: Secondary | ICD-10-CM | POA: Diagnosis not present

## 2016-12-29 DIAGNOSIS — I739 Peripheral vascular disease, unspecified: Secondary | ICD-10-CM | POA: Diagnosis not present

## 2016-12-29 DIAGNOSIS — R52 Pain, unspecified: Secondary | ICD-10-CM | POA: Diagnosis not present

## 2016-12-29 DIAGNOSIS — E785 Hyperlipidemia, unspecified: Secondary | ICD-10-CM | POA: Diagnosis not present

## 2016-12-29 DIAGNOSIS — M6281 Muscle weakness (generalized): Secondary | ICD-10-CM | POA: Diagnosis not present

## 2016-12-29 DIAGNOSIS — I4891 Unspecified atrial fibrillation: Secondary | ICD-10-CM | POA: Diagnosis not present

## 2016-12-29 NOTE — Telephone Encounter (Signed)
[name] at [facility] was calling to get CPAP settings for Ms. Mchugh.  # [phone] nurses desk or supervisor's desk [phone]

## 2016-12-29 NOTE — Telephone Encounter (Signed)
I could not find recent documentation of pt's CPAP pressure setting. I did find documentation from 2015 in one of RA's OV notes that she is set at 12cm. Attempted to contact [place] but I could not get anyone on the phone. Will try back.

## 2016-12-30 NOTE — Telephone Encounter (Signed)
Called spoke with patient's son Inocente Salles - discussed the last pressure setting we have documented for pt's CPAP 12cm Informed [name] that we've been playing 'phone-tag' with [facility] to let them know [name] stated it is probably faster to speak with him because he is going to call the physician personally  Was able to call and speak with Lattie Haw - we discussed pt's CPAP settings.  Lattie Haw asked if patient is scheduled to see RA for sleep follow up.  Informed Lattie Haw that per pt's chart/phone note on 4.6.15 RA asked pt's PCP to take over signing the CMNs because pt did not want/need to see him anymore.  Lattie Haw voiced her understanding.  Nothing further needed, will sign off.

## 2016-12-30 NOTE — Telephone Encounter (Signed)
Attempted to call [facility] x2. I called both numbers that were left with [name] yesterday, there was no answer at either number. Will try back.

## 2016-12-30 NOTE — Telephone Encounter (Signed)
Caesar Chestnut - nurse supervisor at [facility] called back - she can be reached at [phone] - ok to leave message this is her direct cell phone number -pr

## 2017-01-01 DIAGNOSIS — J441 Chronic obstructive pulmonary disease with (acute) exacerbation: Secondary | ICD-10-CM | POA: Diagnosis not present

## 2017-01-01 DIAGNOSIS — M6281 Muscle weakness (generalized): Secondary | ICD-10-CM | POA: Diagnosis not present

## 2017-01-01 DIAGNOSIS — I5030 Unspecified diastolic (congestive) heart failure: Secondary | ICD-10-CM | POA: Diagnosis not present

## 2017-01-01 DIAGNOSIS — I739 Peripheral vascular disease, unspecified: Secondary | ICD-10-CM | POA: Diagnosis not present

## 2017-01-01 DIAGNOSIS — J189 Pneumonia, unspecified organism: Secondary | ICD-10-CM | POA: Diagnosis not present

## 2017-01-01 DIAGNOSIS — E119 Type 2 diabetes mellitus without complications: Secondary | ICD-10-CM | POA: Diagnosis not present

## 2017-01-01 DIAGNOSIS — I4891 Unspecified atrial fibrillation: Secondary | ICD-10-CM | POA: Diagnosis not present

## 2017-01-01 DIAGNOSIS — I2510 Atherosclerotic heart disease of native coronary artery without angina pectoris: Secondary | ICD-10-CM | POA: Diagnosis not present

## 2017-01-01 DIAGNOSIS — E785 Hyperlipidemia, unspecified: Secondary | ICD-10-CM | POA: Diagnosis not present

## 2017-01-01 DIAGNOSIS — J9621 Acute and chronic respiratory failure with hypoxia: Secondary | ICD-10-CM | POA: Diagnosis not present

## 2017-01-01 DIAGNOSIS — R52 Pain, unspecified: Secondary | ICD-10-CM | POA: Diagnosis not present

## 2017-01-01 DIAGNOSIS — Z8673 Personal history of transient ischemic attack (TIA), and cerebral infarction without residual deficits: Secondary | ICD-10-CM | POA: Diagnosis not present

## 2017-01-21 DIAGNOSIS — F338 Other recurrent depressive disorders: Secondary | ICD-10-CM | POA: Diagnosis not present

## 2017-01-21 DIAGNOSIS — J189 Pneumonia, unspecified organism: Secondary | ICD-10-CM | POA: Diagnosis not present

## 2017-01-21 DIAGNOSIS — F419 Anxiety disorder, unspecified: Secondary | ICD-10-CM | POA: Diagnosis not present

## 2017-01-21 DIAGNOSIS — J9620 Acute and chronic respiratory failure, unspecified whether with hypoxia or hypercapnia: Secondary | ICD-10-CM | POA: Diagnosis not present

## 2017-01-21 DIAGNOSIS — R1312 Dysphagia, oropharyngeal phase: Secondary | ICD-10-CM | POA: Diagnosis not present

## 2017-01-21 DIAGNOSIS — F0390 Unspecified dementia without behavioral disturbance: Secondary | ICD-10-CM | POA: Diagnosis not present

## 2017-01-22 DIAGNOSIS — J9620 Acute and chronic respiratory failure, unspecified whether with hypoxia or hypercapnia: Secondary | ICD-10-CM | POA: Diagnosis not present

## 2017-01-22 DIAGNOSIS — R1312 Dysphagia, oropharyngeal phase: Secondary | ICD-10-CM | POA: Diagnosis not present

## 2017-01-22 DIAGNOSIS — J189 Pneumonia, unspecified organism: Secondary | ICD-10-CM | POA: Diagnosis not present

## 2017-01-22 DIAGNOSIS — F0390 Unspecified dementia without behavioral disturbance: Secondary | ICD-10-CM | POA: Diagnosis not present

## 2017-01-25 DIAGNOSIS — J189 Pneumonia, unspecified organism: Secondary | ICD-10-CM | POA: Diagnosis not present

## 2017-01-25 DIAGNOSIS — F0390 Unspecified dementia without behavioral disturbance: Secondary | ICD-10-CM | POA: Diagnosis not present

## 2017-01-25 DIAGNOSIS — F0150 Vascular dementia without behavioral disturbance: Secondary | ICD-10-CM | POA: Diagnosis not present

## 2017-01-25 DIAGNOSIS — J9620 Acute and chronic respiratory failure, unspecified whether with hypoxia or hypercapnia: Secondary | ICD-10-CM | POA: Diagnosis not present

## 2017-01-25 DIAGNOSIS — R1312 Dysphagia, oropharyngeal phase: Secondary | ICD-10-CM | POA: Diagnosis not present

## 2017-01-26 DIAGNOSIS — J189 Pneumonia, unspecified organism: Secondary | ICD-10-CM | POA: Diagnosis not present

## 2017-01-26 DIAGNOSIS — J9620 Acute and chronic respiratory failure, unspecified whether with hypoxia or hypercapnia: Secondary | ICD-10-CM | POA: Diagnosis not present

## 2017-01-26 DIAGNOSIS — R1312 Dysphagia, oropharyngeal phase: Secondary | ICD-10-CM | POA: Diagnosis not present

## 2017-01-26 DIAGNOSIS — F0390 Unspecified dementia without behavioral disturbance: Secondary | ICD-10-CM | POA: Diagnosis not present

## 2017-01-27 DIAGNOSIS — I4891 Unspecified atrial fibrillation: Secondary | ICD-10-CM | POA: Diagnosis not present

## 2017-01-27 DIAGNOSIS — I5030 Unspecified diastolic (congestive) heart failure: Secondary | ICD-10-CM | POA: Diagnosis not present

## 2017-01-27 DIAGNOSIS — J159 Unspecified bacterial pneumonia: Secondary | ICD-10-CM | POA: Diagnosis not present

## 2017-01-27 DIAGNOSIS — J189 Pneumonia, unspecified organism: Secondary | ICD-10-CM | POA: Diagnosis not present

## 2017-01-27 DIAGNOSIS — J9620 Acute and chronic respiratory failure, unspecified whether with hypoxia or hypercapnia: Secondary | ICD-10-CM | POA: Diagnosis not present

## 2017-01-27 DIAGNOSIS — J441 Chronic obstructive pulmonary disease with (acute) exacerbation: Secondary | ICD-10-CM | POA: Diagnosis not present

## 2017-01-27 DIAGNOSIS — Z8673 Personal history of transient ischemic attack (TIA), and cerebral infarction without residual deficits: Secondary | ICD-10-CM | POA: Diagnosis not present

## 2017-01-27 DIAGNOSIS — F0390 Unspecified dementia without behavioral disturbance: Secondary | ICD-10-CM | POA: Diagnosis not present

## 2017-01-27 DIAGNOSIS — I2510 Atherosclerotic heart disease of native coronary artery without angina pectoris: Secondary | ICD-10-CM | POA: Diagnosis not present

## 2017-01-27 DIAGNOSIS — J9621 Acute and chronic respiratory failure with hypoxia: Secondary | ICD-10-CM | POA: Diagnosis not present

## 2017-01-27 DIAGNOSIS — E785 Hyperlipidemia, unspecified: Secondary | ICD-10-CM | POA: Diagnosis not present

## 2017-01-27 DIAGNOSIS — I739 Peripheral vascular disease, unspecified: Secondary | ICD-10-CM | POA: Diagnosis not present

## 2017-01-27 DIAGNOSIS — M6281 Muscle weakness (generalized): Secondary | ICD-10-CM | POA: Diagnosis not present

## 2017-01-27 DIAGNOSIS — R52 Pain, unspecified: Secondary | ICD-10-CM | POA: Diagnosis not present

## 2017-01-27 DIAGNOSIS — R1312 Dysphagia, oropharyngeal phase: Secondary | ICD-10-CM | POA: Diagnosis not present

## 2017-01-27 DIAGNOSIS — E119 Type 2 diabetes mellitus without complications: Secondary | ICD-10-CM | POA: Diagnosis not present

## 2017-02-01 DIAGNOSIS — I5033 Acute on chronic diastolic (congestive) heart failure: Secondary | ICD-10-CM | POA: Diagnosis not present

## 2017-02-01 DIAGNOSIS — E569 Vitamin deficiency, unspecified: Secondary | ICD-10-CM | POA: Diagnosis not present

## 2017-02-01 DIAGNOSIS — I4891 Unspecified atrial fibrillation: Secondary | ICD-10-CM | POA: Diagnosis not present

## 2017-02-01 DIAGNOSIS — I739 Peripheral vascular disease, unspecified: Secondary | ICD-10-CM | POA: Diagnosis not present

## 2017-02-01 DIAGNOSIS — K5900 Constipation, unspecified: Secondary | ICD-10-CM | POA: Diagnosis not present

## 2017-02-01 DIAGNOSIS — F419 Anxiety disorder, unspecified: Secondary | ICD-10-CM | POA: Diagnosis not present

## 2017-02-01 DIAGNOSIS — E785 Hyperlipidemia, unspecified: Secondary | ICD-10-CM | POA: Diagnosis not present

## 2017-02-01 DIAGNOSIS — F0390 Unspecified dementia without behavioral disturbance: Secondary | ICD-10-CM | POA: Diagnosis not present

## 2017-02-01 DIAGNOSIS — J441 Chronic obstructive pulmonary disease with (acute) exacerbation: Secondary | ICD-10-CM | POA: Diagnosis not present

## 2017-02-01 DIAGNOSIS — E119 Type 2 diabetes mellitus without complications: Secondary | ICD-10-CM | POA: Diagnosis not present

## 2017-02-01 DIAGNOSIS — I5030 Unspecified diastolic (congestive) heart failure: Secondary | ICD-10-CM | POA: Diagnosis not present

## 2017-02-01 DIAGNOSIS — I2510 Atherosclerotic heart disease of native coronary artery without angina pectoris: Secondary | ICD-10-CM | POA: Diagnosis not present

## 2017-02-03 ENCOUNTER — Telehealth: Payer: Self-pay

## 2017-02-03 NOTE — Telephone Encounter (Signed)
Patients daughter called and is stating that her mother has extreme anxiety from crying uncontrollably to being extremely mad. Daughter states that she used to be on lexapro and didn't know if she could be put back on it or something else to help even her out. Patient is going to come in with a note on FL2 wants to have a nebulizer to be changed to three a day and not four so they do not have to wake her up in the middle of the night. Daughter also states that mother is having a hard time with her new reality at assisted living and she becomes very hateful. Daughter just wants [name] to know what is going on before her appointment on Monday

## 2017-02-05 NOTE — Telephone Encounter (Signed)
Please advise 

## 2017-02-05 NOTE — Telephone Encounter (Signed)
Daughter is concerned that her Mother is sundowning. During the day she is ok but at night she begins to yell and "is paranoid" and by 1900 at night she was screaming and cussing. Daughter is asking that if Dr. Sharlet Salina please consider giving her something to calm her down. Pt has upcoming appt Monday. Daughter is requesting for Dr Sharlet Salina not to tell pt she is giving her any medicine because she will refuse it.

## 2017-02-05 NOTE — Telephone Encounter (Signed)
We cannot give medication to patient without her knowledge. Will address on Monday

## 2017-02-08 ENCOUNTER — Encounter: Payer: Self-pay | Admitting: Internal Medicine

## 2017-02-08 ENCOUNTER — Ambulatory Visit (INDEPENDENT_AMBULATORY_CARE_PROVIDER_SITE_OTHER): Payer: Medicare Other | Admitting: Internal Medicine

## 2017-02-08 DIAGNOSIS — J9621 Acute and chronic respiratory failure with hypoxia: Secondary | ICD-10-CM

## 2017-02-08 DIAGNOSIS — R451 Restlessness and agitation: Secondary | ICD-10-CM

## 2017-02-08 DIAGNOSIS — N183 Chronic kidney disease, stage 3 (moderate): Secondary | ICD-10-CM

## 2017-02-08 DIAGNOSIS — E118 Type 2 diabetes mellitus with unspecified complications: Secondary | ICD-10-CM

## 2017-02-08 DIAGNOSIS — N179 Acute kidney failure, unspecified: Secondary | ICD-10-CM

## 2017-02-08 NOTE — Progress Notes (Signed)
   Subjective:    Patient ID: [name], female    DOB: 11-20-1931, 83 y.o.   MRN: [id number]  HPI The patient is an 83 YO female coming in for hospital and rehab follow up. She is currently living at ALF with her husband who has significant memory problems and she is living in the same unit which is causing her consternation. She is wanting more freedom and feels like her children tricked her into coming there. She is having SOB and problems with cough and sputum. Off antibiotics but still taking prednisone 20 mg daily. Seeing the lung specialist soon. Did have some aspiration but did not want to use the diet and refused to eat unless it was normal food. She does cough some with eating. Denies constipation or diarrhea. Denies falls. Denies joint pains. Denies headaches or migraines. Denies chest pains. Overall is feeling stronger than when she left the hospital and continues to do PT. Her DIL provided some history over the phone.  PMH, [facility], social history reviewed and updated.  Review of Systems  Constitutional: Positive for activity change and fatigue. Negative for appetite change, chills, fever and unexpected weight change.  HENT: Negative.   Eyes: Negative.   Respiratory: Positive for cough and shortness of breath. Negative for chest tightness.   Cardiovascular: Negative for chest pain, palpitations and leg swelling.  Gastrointestinal: Negative for abdominal distention, abdominal pain, constipation, diarrhea, nausea and vomiting.  Musculoskeletal: Negative.   Skin: Negative.   Neurological: Negative.   Psychiatric/Behavioral: Negative.       Objective:   Physical Exam  Constitutional: She is oriented to person, place, and time. She appears well-developed and well-nourished.  HENT:  Head: Normocephalic and atraumatic.  Eyes: EOM are normal.  Neck: Normal range of motion.  Cardiovascular: Normal rate and regular rhythm.  Pulmonary/Chest: Effort normal and breath sounds normal.  No respiratory distress. She has no wheezes. She has no rales.  Some crackles, wearing oxygen  Abdominal: Soft. Bowel sounds are normal. She exhibits no distension. There is no tenderness. There is no rebound.  Musculoskeletal: She exhibits no edema.  Neurological: She is alert and oriented to person, place, and time. Coordination normal.  Skin: Skin is warm and dry.  Psychiatric:  Some agitation when talking about her situation and somewhat combative about what her DIL is saying.   Vitals:   02/08/17 1415  BP: 110/68  Pulse: 72  Temp: 98.2 F (36.8 C)  TempSrc: Oral  SpO2: 99%  Weight: 163 lb (73.9 kg)  Height: 5\' 6"  (1.676 m)      Assessment & Plan:

## 2017-02-08 NOTE — Patient Instructions (Signed)
We are making the changes we talked about today.  Stopping biotin.  Changing metformin to long acting once a day type.   Checking sugars twice a week to look for any changes with the prednisone.  We would like to see you back in about 1 month.

## 2017-02-10 NOTE — Assessment & Plan Note (Signed)
Changed metformin to long acting to reduce pill burden. Keep glipizide daily. Asked ALF to check sugars twice weekly since she is on the prednisone long term we may need increasing therapy.

## 2017-02-10 NOTE — Assessment & Plan Note (Signed)
She has been off her lexapro for some time and after some debate agrees to trying this again. Her children are wanting this for some hostility to nursing home staff. Also has lorazepam BID prn.

## 2017-02-10 NOTE — Assessment & Plan Note (Signed)
Resolved during the hospital. No labs today.

## 2017-02-10 NOTE — Assessment & Plan Note (Signed)
Still taking prednisone 20 mg daily, wearing oxygen. Will ask pulmonary if she should be on prednisone long term.

## 2017-02-21 ENCOUNTER — Emergency Department (HOSPITAL_COMMUNITY)
Admission: EM | Admit: 2017-02-21 | Discharge: 2017-02-21 | Disposition: A | Discharge status: Home / Self Care | Payer: Medicare Other | Attending: Emergency Medicine | Admitting: Emergency Medicine

## 2017-02-21 ENCOUNTER — Emergency Department (HOSPITAL_COMMUNITY): Payer: Medicare Other

## 2017-02-21 DIAGNOSIS — M25569 Pain in unspecified knee: Secondary | ICD-10-CM | POA: Diagnosis not present

## 2017-02-21 DIAGNOSIS — I130 Hypertensive heart and chronic kidney disease with heart failure and stage 1 through stage 4 chronic kidney disease, or unspecified chronic kidney disease: Secondary | ICD-10-CM | POA: Diagnosis not present

## 2017-02-21 DIAGNOSIS — Z950 Presence of cardiac pacemaker: Secondary | ICD-10-CM | POA: Insufficient documentation

## 2017-02-21 DIAGNOSIS — I2510 Atherosclerotic heart disease of native coronary artery without angina pectoris: Secondary | ICD-10-CM | POA: Insufficient documentation

## 2017-02-21 DIAGNOSIS — Z96642 Presence of left artificial hip joint: Secondary | ICD-10-CM | POA: Insufficient documentation

## 2017-02-21 DIAGNOSIS — W010XXA Fall on same level from slipping, tripping and stumbling without subsequent striking against object, initial encounter: Secondary | ICD-10-CM | POA: Diagnosis not present

## 2017-02-21 DIAGNOSIS — N179 Acute kidney failure, unspecified: Secondary | ICD-10-CM | POA: Diagnosis not present

## 2017-02-21 DIAGNOSIS — R079 Chest pain, unspecified: Secondary | ICD-10-CM | POA: Diagnosis not present

## 2017-02-21 DIAGNOSIS — Z96651 Presence of right artificial knee joint: Secondary | ICD-10-CM | POA: Diagnosis not present

## 2017-02-21 DIAGNOSIS — J449 Chronic obstructive pulmonary disease, unspecified: Secondary | ICD-10-CM | POA: Diagnosis not present

## 2017-02-21 DIAGNOSIS — Y9301 Activity, walking, marching and hiking: Secondary | ICD-10-CM | POA: Diagnosis not present

## 2017-02-21 DIAGNOSIS — S31609A Unspecified open wound of abdominal wall, unspecified quadrant with penetration into peritoneal cavity, initial encounter: Secondary | ICD-10-CM | POA: Diagnosis not present

## 2017-02-21 DIAGNOSIS — R600 Localized edema: Secondary | ICD-10-CM | POA: Diagnosis not present

## 2017-02-21 DIAGNOSIS — Z87891 Personal history of nicotine dependence: Secondary | ICD-10-CM | POA: Diagnosis not present

## 2017-02-21 DIAGNOSIS — Y929 Unspecified place or not applicable: Secondary | ICD-10-CM | POA: Insufficient documentation

## 2017-02-21 DIAGNOSIS — J80 Acute respiratory distress syndrome: Secondary | ICD-10-CM | POA: Diagnosis not present

## 2017-02-21 DIAGNOSIS — Z79899 Other long term (current) drug therapy: Secondary | ICD-10-CM | POA: Insufficient documentation

## 2017-02-21 DIAGNOSIS — S299XXA Unspecified injury of thorax, initial encounter: Secondary | ICD-10-CM | POA: Diagnosis not present

## 2017-02-21 DIAGNOSIS — Z7901 Long term (current) use of anticoagulants: Secondary | ICD-10-CM | POA: Diagnosis not present

## 2017-02-21 DIAGNOSIS — J45909 Unspecified asthma, uncomplicated: Secondary | ICD-10-CM | POA: Insufficient documentation

## 2017-02-21 DIAGNOSIS — R9431 Abnormal electrocardiogram [ECG] [EKG]: Secondary | ICD-10-CM | POA: Diagnosis not present

## 2017-02-21 DIAGNOSIS — E1122 Type 2 diabetes mellitus with diabetic chronic kidney disease: Secondary | ICD-10-CM | POA: Diagnosis not present

## 2017-02-21 DIAGNOSIS — G8911 Acute pain due to trauma: Secondary | ICD-10-CM | POA: Diagnosis not present

## 2017-02-21 DIAGNOSIS — Y999 Unspecified external cause status: Secondary | ICD-10-CM | POA: Diagnosis not present

## 2017-02-21 DIAGNOSIS — N183 Chronic kidney disease, stage 3 (moderate): Secondary | ICD-10-CM | POA: Insufficient documentation

## 2017-02-21 DIAGNOSIS — M79604 Pain in right leg: Secondary | ICD-10-CM | POA: Diagnosis not present

## 2017-02-21 DIAGNOSIS — R100 Acute abdomen: Secondary | ICD-10-CM | POA: Diagnosis not present

## 2017-02-21 DIAGNOSIS — W19XXXA Unspecified fall, initial encounter: Secondary | ICD-10-CM

## 2017-02-21 DIAGNOSIS — I5032 Chronic diastolic (congestive) heart failure: Secondary | ICD-10-CM | POA: Insufficient documentation

## 2017-02-21 DIAGNOSIS — S8991XA Unspecified injury of right lower leg, initial encounter: Secondary | ICD-10-CM | POA: Diagnosis not present

## 2017-02-21 DIAGNOSIS — S0990XA Unspecified injury of head, initial encounter: Secondary | ICD-10-CM | POA: Diagnosis not present

## 2017-02-21 LAB — CBC WITH DIFFERENTIAL/PLATELET
BASOS PCT: 0 %
Basophils Absolute: 0 10*3/uL (ref 0.0–0.1)
EOS ABS: 0 10*3/uL (ref 0.0–0.7)
Eosinophils Relative: 0 %
HCT: 34.7 % — ABNORMAL LOW (ref 36.0–46.0)
HEMOGLOBIN: 11.1 g/dL — AB (ref 12.0–15.0)
Lymphocytes Relative: 16 %
Lymphs Abs: 1.8 10*3/uL (ref 0.7–4.0)
MCH: 29.8 pg (ref 26.0–34.0)
MCHC: 32 g/dL (ref 30.0–36.0)
MCV: 93 fL (ref 78.0–100.0)
MONOS PCT: 7 %
Monocytes Absolute: 0.8 10*3/uL (ref 0.1–1.0)
NEUTROS PCT: 77 %
Neutro Abs: 8.9 10*3/uL — ABNORMAL HIGH (ref 1.7–7.7)
PLATELETS: 229 10*3/uL (ref 150–400)
RBC: 3.73 MIL/uL — ABNORMAL LOW (ref 3.87–5.11)
RDW: 21 % — AB (ref 11.5–15.5)
WBC: 11.6 10*3/uL — ABNORMAL HIGH (ref 4.0–10.5)

## 2017-02-21 LAB — COMPREHENSIVE METABOLIC PANEL
ALBUMIN: 2.9 g/dL — AB (ref 3.5–5.0)
ALK PHOS: 45 U/L (ref 38–126)
ALT: 12 U/L — ABNORMAL LOW (ref 14–54)
ANION GAP: 9 (ref 5–15)
AST: 21 U/L (ref 15–41)
BUN: 23 mg/dL — ABNORMAL HIGH (ref 6–20)
CO2: 26 mmol/L (ref 22–32)
Calcium: 8.5 mg/dL — ABNORMAL LOW (ref 8.9–10.3)
Chloride: 105 mmol/L (ref 101–111)
Creatinine, Ser: 1.53 mg/dL — ABNORMAL HIGH (ref 0.44–1.00)
GFR calc Af Amer: 35 mL/min — ABNORMAL LOW (ref >60)
GFR calc non Af Amer: 30 mL/min — ABNORMAL LOW (ref >60)
GLUCOSE: 164 mg/dL — AB (ref 65–99)
POTASSIUM: 4.5 mmol/L (ref 3.5–5.1)
SODIUM: 140 mmol/L (ref 135–145)
Total Bilirubin: 0.5 mg/dL (ref 0.3–1.2)
Total Protein: 5.8 g/dL — ABNORMAL LOW (ref 6.5–8.1)

## 2017-02-21 LAB — BRAIN NATRIURETIC PEPTIDE: B Natriuretic Peptide: 295.4 pg/mL — ABNORMAL HIGH (ref 0.0–100.0)

## 2017-02-21 NOTE — ED Notes (Signed)
Communications called to dispatch [facility] to transport pt back to SNF.

## 2017-02-21 NOTE — ED Notes (Signed)
Delay in lab draw,  Pt currently in xray.

## 2017-02-21 NOTE — ED Provider Notes (Signed)
Emergency Department Provider Note   I have reviewed the triage vital signs and the nursing notes.   HISTORY  Chief Complaint Fall   HPI [name] is a 83 y.o. female with multiple medical problems as documented below on blood thinners presents to the emergency department today secondary to a fall.  It sounds mechanical in nature.  Patient states that she was trying to go get the phone and she was walking through a bunch of stuff on the floor and tripped over a wire and fell on her right side which is decided 30 now and hitting the right side of her head.  She did not lose consciousness.  She had persistent pain so she called EMS who brought her here for evaluation.  This time she is complaining of pain in her right posterior ribs, right knee and right anterior shin.  Otherwise feels normal.  No other associated modifying symptom.  Past Medical History:  Diagnosis Date  . Acute blood loss anemia 04/20/2012  . AF (atrial fibrillation) ([place])     AV ablation 9/09 [place] per Dr Ola Spurr - AV node ablation 9/11 Dr Caryl Comes  . Amiodarone pulmonary toxicity   . Ascending aortic aneurysm ([place]) 12/11/2016  . Asthma   . Bronchitis    hx of   . [name] (coronary artery disease)    (not sure of this 11/10  . CHF (congestive heart failure) ([place])   . Chronic renal insufficiency, stage III (moderate) (HCC)    CrCl about 60 ml/min  . CKD (chronic kidney disease) stage 3, GFR 30-59 ml/min (HCC)   . Complication of anesthesia    "psycotic episode" after hip surg - resolved  . COPD (chronic obstructive pulmonary disease) (HCC)    emphysema -FeV1 73% DLCO 53% 5/09  . CVA (cerebral vascular accident) ([place])    no residual effects evident to pt   . Depression 06/06/2012  . Diabetes ([place])   . Diastolic heart failure    Acute on Chronic  . Eczema   . GERD (gastroesophageal reflux disease)   . History of skin cancer   . Hx of cardiovascular stress test    Lexiscan Myoview (09/2013):  No definite  ischemia, EF 67%; low risk  . Hyperlipidemia   . Invasive ductal carcinoma of breast ([place]) 2011   LEFT   . Mental disorder   . OA (osteoarthritis) of knee    RIGHT  . Pacemaker    Permanent  . PAD (peripheral artery disease) (HCC)    w/hx right iliac/SFA stenting and left and rt leg PTA  . Pain    pt states has pain in fingertips per right hand pt states has been told may be carpal tunnel  . Pneumonia    hx of   . Right sided sciatica   . Shortness of breath dyspnea    walking distances   . Sinoatrial node dysfunction (HCC)   . Sleep apnea    associated with hypersomnia uses O2 2L/M at night and during naps also uses CPAP  . Tobacco abuse   . Tremors of nervous system    hands bilat     Patient Active Problem List   Diagnosis Date Noted  . Pulmonary nodule 12/11/2016  . Ascending aortic aneurysm ([place]) 12/11/2016  . Agitation   . Acute on chronic respiratory failure with hypoxia ([place]) 11/28/2016  . HCAP (healthcare-associated pneumonia) 11/28/2016  . Acute renal failure superimposed on stage 3 chronic kidney disease ([place]) 11/28/2016  .  Gait instability 06/22/2016  . Chronic respiratory failure ([place]) 05/15/2016  . Community acquired pneumonia 04/25/2016  . Rotator cuff tear arthropathy of both shoulders 12/09/2015  . Complete heart block ([place]) 02/13/2015  . Acute blood loss anemia 01/28/2015  . Constipation 11/07/2014  . OA (osteoarthritis) of hip 10/31/2014  . Chest pain, atypical 06/07/2014  . Osteoporosis 01/10/2014  . Nicotine dependence in remission 06/28/2013  . GERD (gastroesophageal reflux disease) 07/19/2012  . Depression 06/06/2012  . Secondary hyperparathyroidism ([name]) 04/25/2012  . Gold stage C. COPD with frequent exacerbations 12/15/2011  . Diastolic CHF, chronic ([place]) 12/15/2011  . Goals of care, counseling/discussion 08/27/2011  . History of stroke 02/21/2010  . PACEMAKER, PERMANENT 02/21/2010  . Breast cancer of upper-inner quadrant of left female  breast ([place]) 02/03/2010  . Allergic rhinitis 12/31/2009  . Carotid stenosis 08/17/2008  . Controlled diabetes mellitus type 2 with complications ([place]) 16/12/9602  . Obstructive sleep apnea 06/01/2008  . Atrial fibrillation ([place]) 06/01/2008  . [name] 06/01/2008  . Hyperlipidemia 12/30/2006  . [name] (coronary artery disease) 12/30/2006    Past Surgical History:  Procedure Laterality Date  . ATRIAL ABLATION SURGERY     x 2 - "did not help - had to have pacemaker"  . BREAST LUMPECTOMY     left breast  . CARDIAC CATHETERIZATION    . CATARACT EXTRACTION, BILATERAL     with IOL/Dr Katy Fitch  . EP IMPLANTABLE DEVICE N/A 02/13/2015   Procedure: PPM Generator Changeout-St[employer] Jude device;  Surgeon: Deboraha Sprang, MD;  Location: [place] [facility];  Service: Cardiovascular;  Laterality: N/A;  . MASTECTOMY, PARTIAL  02/03/2010   Left/Dr Rosenbower  . ORIF ACETABULAR FRACTURE Left 04/19/2012   Procedure: OPEN REDUCTION INTERNAL FIXATION (ORIF) ACETABULAR FRACTURE;  Surgeon: Rozanna Box, MD;  Location: [facility];  Service: Orthopedics;  Laterality: Left;  . sun spot removed      forhead  . TOTAL HIP ARTHROPLASTY Left 10/31/2014   Procedure: LEFT TOTAL HIP ARTHROPLASTY POSTERIOR  APPROACH;  Surgeon: Gaynelle Arabian, MD;  Location: [facility];  Service: Orthopedics;  Laterality: Left;  . TOTAL KNEE ARTHROPLASTY Right 10/16/2013   Procedure: RIGHT TOTAL KNEE ARTHROPLASTY;  Surgeon: Gearlean Alf, MD;  Location: [facility];  Service: Orthopedics;  Laterality: Right;  Marland Kitchen VASCULAR SURGERY     both legs     Current Outpatient Rx  . Order #: [id number] Class: Historical Med  . Order #: [id number] Class: Normal  . Order #: [id number] Class: Normal  . Order #: [id number] Class: Print  . Order #: [id number] Class: No Print  . Order #: [id number] Class: Normal  . Order #: [id number] Class: No Print  . Order #: [id number] Class: Fax  . Order #: [id number] Class: Normal  . Order #: [id number] Class: Historical Med  . Order #:  [id number] Class: Normal  . Order #: [id number] Class: Normal  . Order #: [id number] Class: No Print  . Order #: [id number] Class: No Print  . Order #: [id number] Class: No Print  . Order #: [id number] Class: No Print  . Order #: [id number] Class: Normal    Allergies Cholestatin and Sulfur  Family History  Problem Relation Age of Onset  . Heart disease Father     Social History Social History   Tobacco Use  . Smoking status: Former Smoker    Packs/day: 1.00    Years: 55.00    Pack years: 55.00    Types: Cigarettes    Last attempt to quit: 12/19/2011    Years since quitting: 5.1  . Smokeless tobacco: Never Used  .  Tobacco comment: started smoking at age 89--4 cigs per day.  smoked 2ppd the last 10 yrs  Substance Use Topics  . Alcohol use: Yes    Comment: wine (rare)  . Drug use: No    Review of Systems  All other systems negative except as documented in the HPI. All pertinent positives and negatives as reviewed in the HPI. ____________________________________________   PHYSICAL EXAM:  VITAL SIGNS: ED Triage Vitals  Blood pressure (!) 117/55, pulse 72, temperature 98.2 F (36.8 C), temperature source Oral, resp. rate 16, SpO2 96 %.   Constitutional: Alert and oriented. Well appearing and in no acute distress. Eyes: Conjunctivae are normal. PERRL. EOMI. Head: Atraumatic. Nose: No congestion/rhinnorhea. Mouth/Throat: Mucous membranes are moist.  Oropharynx non-erythematous. Neck: No stridor.  No meningeal signs.   Cardiovascular: Normal rate, regular rhythm. Good peripheral circulation. Grossly normal heart sounds.   Respiratory: Normal respiratory effort.  No retractions. Lungs CTAB. Gastrointestinal: Soft and nontender. No distention.  Musculoskeletal: No lower extremity tenderness but does have mild edema. No gross deformities of extremities. Tenderness to right posterior chest. Neurologic:  Normal speech and language. No gross focal neurologic deficits are appreciated.    Skin:  Skin is warm, dry. Abrasion to right knee, small ecchymotic area over mid anterior right shin. No rash noted.   ____________________________________________   LABS (all labs ordered are listed, but only abnormal results are displayed)  Labs Reviewed  CBC WITH DIFFERENTIAL/PLATELET - Abnormal; Notable for the following components:      Result Value   WBC 11.6 (*)    RBC 3.73 (*)    Hemoglobin 11.1 (*)    HCT 34.7 (*)    RDW 21.0 (*)    Neutro Abs 8.9 (*)    All other components within normal limits  COMPREHENSIVE METABOLIC PANEL - Abnormal; Notable for the following components:   Glucose, Bld 164 (*)    BUN 23 (*)    Creatinine, Ser 1.53 (*)    Calcium 8.5 (*)    Total Protein 5.8 (*)    Albumin 2.9 (*)    ALT 12 (*)    GFR calc non Af Amer 30 (*)    GFR calc Af Amer 35 (*)    All other components within normal limits  BRAIN NATRIURETIC PEPTIDE - Abnormal; Notable for the following components:   B Natriuretic Peptide 295.4 (*)    All other components within normal limits   ____________________________________________  EKG   EKG Interpretation  Date/Time:    Ventricular Rate:    PR Interval:    QRS Duration:   QT Interval:    QTC Calculation:   R Axis:     Text Interpretation:         ____________________________________________  RADIOLOGY  Dg Chest 2 View  Result Date: 02/21/2017 CLINICAL DATA:  Pain following fall EXAM: CHEST  2 VIEW COMPARISON:  December 09, 2016 FINDINGS: There is atelectatic change in the left base. There is chronic diffuse nodular interstitial disease, stable. There is no edema or consolidation. Heart is upper normal in size with pulmonary vascularity within normal limits. Pacemaker leads are attached to the right atrium and middle cardiac vein. No adenopathy. There is aortic atherosclerosis. Bones are osteoporotic. There is degenerative change in each shoulder. No appreciable pneumothorax. IMPRESSION: Atelectatic change left base.  Stable the nodular interstitial disease of uncertain etiology. No edema or consolidation. Stable cardiac silhouette. There is aortic atherosclerosis. Bones are osteoporotic. There is degenerative change in each shoulder. Aortic  Atherosclerosis (ICD10-I70.0). Electronically Signed   By: Lowella Grip III M.D.   On: 02/21/2017 19:54   Dg Tibia/fibula Right  Result Date: 02/21/2017 CLINICAL DATA:  Pain following along EXAM: RIGHT TIBIA AND FIBULA - 2 VIEW COMPARISON:  April 14, 2012 FINDINGS: Frontal and lateral views were obtained. There is no acute fracture or dislocation. Bones are osteoporotic. Patient is status post total knee replacement with femoral and tibial prosthetic components well-seated. There are foci of atherosclerotic calcification in the distal superficial femoral artery, popliteal artery, and proximal trifurcation arteries. A stent is noted in the distal left superficial femoral artery. IMPRESSION: Bones osteoporotic. No acute fracture or dislocation. Total knee replacement with prosthetic components well-seated. Multiple foci of arterial atherosclerotic calcification. Electronically Signed   By: Lowella Grip III M.D.   On: 02/21/2017 19:55   Ct Head Wo Contrast  Result Date: 02/21/2017 CLINICAL DATA:  Fall, on anti coagulation. EXAM: CT HEAD WITHOUT CONTRAST TECHNIQUE: Contiguous axial images were obtained from the base of the skull through the vertex without intravenous contrast. COMPARISON:  01/30/2013. FINDINGS: Brain: There is atrophy and chronic small vessel disease changes. No acute intracranial abnormality. Specifically, no hemorrhage, hydrocephalus, mass lesion, acute infarction, or significant intracranial injury. Vascular: No hyperdense vessel or unexpected calcification. Skull: No acute calvarial abnormality. Sinuses/Orbits: Air-fluid level in the left maxillary sinus. Other: None IMPRESSION: No acute intracranial abnormality. Atrophy, chronic microvascular disease.  Acute sinusitis changes in the left maxillary sinus. Electronically Signed   By: Rolm Baptise M.D.   On: 02/21/2017 20:18    ____________________________________________   PROCEDURES  Procedure(s) performed:   Procedures   ____________________________________________   INITIAL IMPRESSION / ASSESSMENT AND PLAN / ED COURSE  Mild edema worse than normal, will eval for CHF exacerbation.  Mechanical fall wo syncope - Will xr affected body parts, ct head 2/2 anticoagualation and head trauma. Suspect will be ok for discharge.  No fractures. Ambulates similar to baseline. Will increase diuresis a bit over next couple days and PCP follow up. otherwise stable for discharge.   Pertinent labs & imaging results that were available during my care of the patient were reviewed by me and considered in my medical decision making (see chart for details).  ____________________________________________  FINAL CLINICAL IMPRESSION(S) / ED DIAGNOSES  Final diagnoses:  Fall, initial encounter  Localized edema  AKI (acute kidney injury) ([place])     MEDICATIONS GIVEN DURING THIS VISIT:  Medications - No data to display   NEW OUTPATIENT MEDICATIONS STARTED DURING THIS VISIT:  This SmartLink is deprecated. Use AVSMEDLIST instead to display the medication list for a patient.  Note:  This note was prepared with assistance of Dragon voice recognition software. Occasional wrong-word or sound-a-like substitutions may have occurred due to the inherent limitations of voice recognition software.   Caylah Plouff, [name], MD 02/22/17 [id number]

## 2017-02-21 NOTE — ED Notes (Signed)
Pt ambulated efficiently with a walker without discomfort.

## 2017-02-21 NOTE — Discharge Instructions (Signed)
Please double your Lasix (take 80 mg twice daily) for the next 2 days and follow up with cardiology for recheck of lower extremity swelling and kidney function.

## 2017-02-21 NOTE — ED Notes (Signed)
Unable to obtain e-signature as no computer available.  Pt verbalized understanding of d/c instructions.

## 2017-02-21 NOTE — ED Triage Notes (Signed)
Pt arrives from [facility] after a mechanical (tripped over phone cord) fall at approx 18:00 today. Pt fell on her right side and is c/o of pain to her right knee, elbow, ribs, and stated she also struck her head but is not c/o pain at the site. Pt is alert and oriented x4. No obvious deformities noted. Pain rated 1/10 at time of triage.

## 2017-02-23 ENCOUNTER — Ambulatory Visit (INDEPENDENT_AMBULATORY_CARE_PROVIDER_SITE_OTHER): Payer: Medicare Other | Admitting: Internal Medicine

## 2017-02-23 ENCOUNTER — Other Ambulatory Visit: Payer: Self-pay

## 2017-02-23 ENCOUNTER — Encounter: Payer: Self-pay | Admitting: Internal Medicine

## 2017-02-23 DIAGNOSIS — J189 Pneumonia, unspecified organism: Secondary | ICD-10-CM | POA: Diagnosis not present

## 2017-02-23 DIAGNOSIS — J9621 Acute and chronic respiratory failure with hypoxia: Secondary | ICD-10-CM | POA: Diagnosis not present

## 2017-02-23 MED ORDER — DOXYCYCLINE HYCLATE 100 MG PO TABS: 100.0000 mg | ORAL_TABLET | Freq: Two times a day (BID) | ORAL | 0 refills | Status: DC

## 2017-02-23 NOTE — Progress Notes (Signed)
   Subjective:    Patient ID: [name], female    DOB: Sep 18, 1931, 83 y.o.   MRN: [id number]  HPI The patient is an 83 YO female coming in for cough and cold symptoms. She does have a chronic cough which was thought to be related to some aspiration in the hospital. She was not able to tolerate the thickened diet and so is using normal food and liquids. She is taking prednisone daily per pulmonary for her lung function. The cough was better for awhile and now the last 5-6 days she is coughing worse. More SOB. Brownish sputum. She is taking nebulizer treatment TID and prn. She denies fevers or chills. Fall over the weekend and evaluation at the ER without injury.   Review of Systems  Constitutional: Positive for activity change and chills. Negative for appetite change, fatigue, fever and unexpected weight change.  HENT: Negative for congestion, ear discharge, ear pain, postnasal drip, rhinorrhea, sinus pressure, sinus pain, sneezing, sore throat, tinnitus, trouble swallowing and voice change.   Eyes: Negative.   Respiratory: Positive for cough, shortness of breath and wheezing. Negative for chest tightness.   Cardiovascular: Negative.   Gastrointestinal: Negative.   Musculoskeletal: Negative.   Neurological: Negative.       Objective:   Physical Exam  Constitutional: She is oriented to person, place, and time. She appears well-developed and well-nourished.  HENT:  Head: Normocephalic and atraumatic.  Eyes: EOM are normal.  Neck: Normal range of motion.  Cardiovascular: Normal rate and regular rhythm.  Pulmonary/Chest: Effort normal. No respiratory distress. She has wheezes. She has no rales.  Expiratory wheezing and more rhonchi that previous exam 2 weeks ago, on oxygen  Abdominal: Soft.  Lymphadenopathy:    She has no cervical adenopathy.  Neurological: She is alert and oriented to person, place, and time. Coordination abnormal.  Skin: Skin is warm and dry.   Vitals:   02/23/17 1517  BP: 118/70  Pulse: 74  Temp: 98.2 F (36.8 C)  TempSrc: Oral  SpO2: 98%  Weight: 164 lb (74.4 kg)  Height: 5\' 6"  (1.676 m)      Assessment & Plan:

## 2017-02-23 NOTE — Patient Instructions (Signed)
We have changed the diet.   We will send in the doxycycline order for the [place] to take 1 pill twice a day for 1 week for the cough. If the aspiration is causing the cough this could come back again as you swallow some food or liquids into the lungs. Eat with small bites and wash down with water. Tuck your chin in to your chest to swallow to help limit food and liquid getting into your lungs.

## 2017-02-24 ENCOUNTER — Telehealth: Payer: Self-pay | Admitting: Internal Medicine

## 2017-02-24 ENCOUNTER — Other Ambulatory Visit: Payer: Self-pay | Admitting: Internal Medicine

## 2017-02-24 MED ORDER — DOXYCYCLINE HYCLATE 100 MG PO TABS: 100.0000 mg | ORAL_TABLET | Freq: Two times a day (BID) | ORAL | 0 refills | Status: DC

## 2017-02-24 NOTE — Telephone Encounter (Signed)
Script handled by Dr. Sharlet Salina

## 2017-02-24 NOTE — Telephone Encounter (Signed)
Copied from [place] [phone]. Topic: Quick Communication - See Telephone Encounter >> Feb 24, 2017  8:38 AM Burnis Medin, NT wrote: CRM for notification. See Telephone encounter for: Pt. Called in and said his mother seen the doctor yesterday and was suppose to get a prescription called in and it has not been received yet. Medication is doxycycline (VIBRA-TABS) 100 MG tablet. Pt uses [id number] - [place], [place]  02/24/17.

## 2017-02-24 NOTE — Assessment & Plan Note (Signed)
Rx for doxycycline for 1 week and continue prednisone 20 mg daily. She will still follow with pulmonary. She is frustrated that this cough keeps coming back. We talked about how the aspiration is likely chronic and food contents in the lung will be a good environment for bacteria to thrive and grow and since she was not able to tolerate the thickened diet this will likely be a recurrent problem.

## 2017-02-24 NOTE — Telephone Encounter (Signed)
Pt's son has called again checking on this refill... Do you know anything about it?

## 2017-02-24 NOTE — Assessment & Plan Note (Signed)
Will resolve this problem as CXR without acute process 02/21/17 but some atelectasis left lung.

## 2017-03-02 DIAGNOSIS — I5033 Acute on chronic diastolic (congestive) heart failure: Secondary | ICD-10-CM | POA: Diagnosis not present

## 2017-03-02 DIAGNOSIS — E119 Type 2 diabetes mellitus without complications: Secondary | ICD-10-CM | POA: Diagnosis not present

## 2017-03-02 DIAGNOSIS — J441 Chronic obstructive pulmonary disease with (acute) exacerbation: Secondary | ICD-10-CM | POA: Diagnosis not present

## 2017-03-02 DIAGNOSIS — I5030 Unspecified diastolic (congestive) heart failure: Secondary | ICD-10-CM | POA: Diagnosis not present

## 2017-03-02 DIAGNOSIS — I4891 Unspecified atrial fibrillation: Secondary | ICD-10-CM | POA: Diagnosis not present

## 2017-03-02 DIAGNOSIS — E569 Vitamin deficiency, unspecified: Secondary | ICD-10-CM | POA: Diagnosis not present

## 2017-03-02 DIAGNOSIS — K5900 Constipation, unspecified: Secondary | ICD-10-CM | POA: Diagnosis not present

## 2017-03-02 DIAGNOSIS — F0390 Unspecified dementia without behavioral disturbance: Secondary | ICD-10-CM | POA: Diagnosis not present

## 2017-03-02 DIAGNOSIS — E785 Hyperlipidemia, unspecified: Secondary | ICD-10-CM | POA: Diagnosis not present

## 2017-03-02 DIAGNOSIS — I739 Peripheral vascular disease, unspecified: Secondary | ICD-10-CM | POA: Diagnosis not present

## 2017-03-02 DIAGNOSIS — F419 Anxiety disorder, unspecified: Secondary | ICD-10-CM | POA: Diagnosis not present

## 2017-03-02 DIAGNOSIS — I2510 Atherosclerotic heart disease of native coronary artery without angina pectoris: Secondary | ICD-10-CM | POA: Diagnosis not present

## 2017-03-05 ENCOUNTER — Ambulatory Visit (INDEPENDENT_AMBULATORY_CARE_PROVIDER_SITE_OTHER): Payer: Medicare Other | Admitting: Internal Medicine

## 2017-03-05 ENCOUNTER — Encounter: Payer: Self-pay | Admitting: Internal Medicine

## 2017-03-05 VITALS — BP 116/68 | HR 71 | Temp 98.4°F

## 2017-03-05 DIAGNOSIS — J9611 Chronic respiratory failure with hypoxia: Secondary | ICD-10-CM | POA: Diagnosis not present

## 2017-03-05 DIAGNOSIS — J189 Pneumonia, unspecified organism: Secondary | ICD-10-CM | POA: Diagnosis not present

## 2017-03-05 DIAGNOSIS — I482 Chronic atrial fibrillation, unspecified: Secondary | ICD-10-CM

## 2017-03-05 DIAGNOSIS — I5032 Chronic diastolic (congestive) heart failure: Secondary | ICD-10-CM

## 2017-03-05 MED ORDER — METHYLPREDNISOLONE ACETATE 80 MG/ML IJ SUSP
80.0000 mg | Freq: Once | INTRAMUSCULAR | Status: AC
  Administered 2017-03-05: 80 mg via INTRAMUSCULAR

## 2017-03-05 MED ORDER — CEFDINIR 300 MG PO CAPS: 300.0000 mg | ORAL_CAPSULE | Freq: Two times a day (BID) | ORAL | 0 refills | Status: DC

## 2017-03-05 MED ORDER — CEFTRIAXONE SODIUM 1 G IJ SOLR
1.0000 g | Freq: Once | INTRAMUSCULAR | Status: AC
  Administered 2017-03-05: 1 g via INTRAMUSCULAR

## 2017-03-05 MED ORDER — FLUTICASONE-SALMETEROL 250-50 MCG/DOSE IN AEPB: 1.0000 | INHALATION_SPRAY | Freq: Two times a day (BID) | RESPIRATORY_TRACT | 11 refills | Status: DC

## 2017-03-05 NOTE — Patient Instructions (Signed)
Diabetic socks

## 2017-03-05 NOTE — Assessment & Plan Note (Signed)
aspiration vs obstruction vs other Repeat chest CT Omnicef po  Rocephin IM Bed head end 45 degrees

## 2017-03-05 NOTE — Assessment & Plan Note (Addendum)
Chest CT ordered No increase in Lasix dose due to elevated Creatinine We discussed the importance of choosing low salt food at the dining room BMET

## 2017-03-05 NOTE — Progress Notes (Signed)
Subjective:  Patient ID: [name], female    DOB: 26-Dec-1931  Age: 83 y.o. MRN: [id number]  CC: No chief complaint on file.   HPI [name] presents for a recurrent cough since Jan off and on C/o recurrent CAP with possible aspiration Has OSA - not using a CPAP Quit cigs 4 years ago. On O2. H/o CHF Eating bacon, soups a lot at her assisted living place ([facility]) Dr Fransico Him is on the speaker phone  Outpatient Medications Prior to Visit  Medication Sig Dispense Refill  . acetaminophen (TYLENOL) 325 MG tablet Take 650 mg by mouth every 6 (six) hours as needed for mild pain or fever.    Marland Kitchen ADVAIR DISKUS 250-50 MCG/DOSE AEPB INHALE 1 PUFF TWICE DAILY  (30  DAY  EXPIRATION) 180 each 3  . atorvastatin (LIPITOR) 20 MG tablet TAKE 1 TABLET EVERY DAY 90 tablet 3  . B-D ULTRA-FINE 33 LANCETS MISC Use to help check blood sugars daily Dx E11.9 100 each 2  . dextromethorphan (DELSYM) 30 MG/5ML liquid Take 5 mLs (30 mg total) by mouth 2 (two) times daily as needed for cough. 89 mL 0  . doxycycline (VIBRA-TABS) 100 MG tablet Take 1 tablet (100 mg total) by mouth 2 (two) times daily. 14 tablet 0  . ELIQUIS 5 MG TABS tablet TAKE 1 TABLET TWICE DAILY  (MUST  ESTABLISH  NEW  PRIMARY CARE PROVIDER  FOR FUTURE REFILLS) 180 tablet 3  . escitalopram (LEXAPRO) 10 MG tablet     . furosemide (LASIX) 40 MG tablet Take 1 tablet (40 mg total) by mouth 2 (two) times daily. 30 tablet   . glucose blood (BAYER CONTOUR TEST) test strip 1 each by Other route daily. Use to check blood sugars every day Dx E11.9 90 each 3  . levalbuterol (XOPENEX HFA) 45 MCG/ACT inhaler Inhale 2 puffs into the lungs every 6 (six) hours as needed. Reported on 09/23/2015 1 Inhaler 6  . levalbuterol (XOPENEX) 0.63 MG/3ML nebulizer solution Take 0.63 mg by nebulization every 4 (four) hours as needed for wheezing or shortness of breath.    . metFORMIN (GLUCOPHAGE) 500 MG tablet TAKE 1 TABLET TWICE DAILY WITH A MEAL 180  tablet 1  . MUCINEX 600 MG 12 hr tablet TAKE ONE TABLET TWICE DAILY 60 tablet 0  . polyethylene glycol (MIRALAX / GLYCOLAX) packet Take 17 g by mouth 2 (two) times daily. 14 each 0  . potassium chloride SA (K-DUR,KLOR-CON) 20 MEQ tablet Take 1 tablet (20 mEq total) by mouth 2 (two) times daily.    . predniSONE (DELTASONE) 20 MG tablet Take 1 tablet (20 mg total) by mouth daily with breakfast.    . senna-docusate (SENOKOT-S) 8.6-50 MG tablet Take 1 tablet by mouth 2 (two) times daily.    Marland Kitchen SPIRIVA HANDIHALER 18 MCG inhalation capsule INHALE THE CONTENTS OF 1 CAPSULE EVERY DAY 90 capsule 3   No facility-administered medications prior to visit.     ROS Review of Systems  Constitutional: Negative for activity change, appetite change, chills, fatigue and unexpected weight change.  HENT: Positive for congestion. Negative for mouth sores and sinus pressure.   Eyes: Negative for visual disturbance.  Respiratory: Positive for cough and shortness of breath. Negative for chest tightness.   Cardiovascular: Positive for leg swelling.  Gastrointestinal: Negative for abdominal pain and nausea.  Genitourinary: Negative for difficulty urinating, frequency and vaginal pain.  Musculoskeletal: Negative for back pain and gait problem.  Skin: Negative for pallor  and rash.  Neurological: Negative for dizziness, tremors, weakness, numbness and headaches.  Psychiatric/Behavioral: Negative for confusion and sleep disturbance.    Objective:  BP 116/68 (BP Location: Left Arm, Patient Position: Sitting, Cuff Size: Large)   Pulse 71   Temp 98.4 F (36.9 C) (Oral)   SpO2 98%   BP Readings from Last 3 Encounters:  03/05/17 116/68  02/23/17 118/70  02/21/17 (!) 152/75    Wt Readings from Last 3 Encounters:  02/23/17 164 lb (74.4 kg)  02/08/17 163 lb (73.9 kg)  12/10/16 157 lb 13.6 oz (71.6 kg)    Physical Exam  Constitutional: She appears well-developed. No distress.  HENT:  Head: Normocephalic.    Right Ear: External ear normal.  Left Ear: External ear normal.  Nose: Nose normal.  Mouth/Throat: Oropharynx is clear and moist.  Eyes: Conjunctivae are normal. Pupils are equal, round, and reactive to light. Right eye exhibits no discharge. Left eye exhibits no discharge.  Neck: Normal range of motion. Neck supple. No JVD present. No tracheal deviation present. No thyromegaly present.  Cardiovascular: Normal rate, regular rhythm and normal heart sounds.  Pulmonary/Chest: No stridor. No respiratory distress. She has no wheezes. She has rales.  Abdominal: Soft. Bowel sounds are normal. She exhibits no distension and no mass. There is no tenderness. There is no rebound and no guarding.  Musculoskeletal: She exhibits edema. She exhibits no tenderness.  Lymphadenopathy:    She has no cervical adenopathy.  Neurological: She displays normal reflexes. No cranial nerve deficit. She exhibits normal muscle tone. Coordination abnormal.  Skin: No rash noted. No erythema.  Psychiatric: She has a normal mood and affect. Her behavior is normal. Judgment and thought content normal.  in NAD Obese Mild crackles at the R base Trace LE edema No JVD O2 is on  Lab Results  Component Value Date   WBC 11.6 (H) 02/21/2017   HGB 11.1 (L) 02/21/2017   HCT 34.7 (L) 02/21/2017   PLT 229 02/21/2017   GLUCOSE 164 (H) 02/21/2017   CHOL 133 11/22/2015   TRIG 205.0 (H) 11/22/2015   HDL 43.70 11/22/2015   LDLDIRECT 67.0 11/22/2015   LDLCALC 53 08/27/2011   ALT 12 (L) 02/21/2017   AST 21 02/21/2017   NA 140 02/21/2017   K 4.5 02/21/2017   CL 105 02/21/2017   CREATININE 1.53 (H) 02/21/2017   BUN 23 (H) 02/21/2017   CO2 26 02/21/2017   TSH 2.02 09/05/2013   INR 2.45 11/28/2016   HGBA1C 7.0 (H) 04/26/2016    Dg Chest 2 View  Result Date: 02/21/2017 CLINICAL DATA:  Pain following fall EXAM: CHEST  2 VIEW COMPARISON:  December 09, 2016 FINDINGS: There is atelectatic change in the left base. There is  chronic diffuse nodular interstitial disease, stable. There is no edema or consolidation. Heart is upper normal in size with pulmonary vascularity within normal limits. Pacemaker leads are attached to the right atrium and middle cardiac vein. No adenopathy. There is aortic atherosclerosis. Bones are osteoporotic. There is degenerative change in each shoulder. No appreciable pneumothorax. IMPRESSION: Atelectatic change left base. Stable the nodular interstitial disease of uncertain etiology. No edema or consolidation. Stable cardiac silhouette. There is aortic atherosclerosis. Bones are osteoporotic. There is degenerative change in each shoulder. Aortic Atherosclerosis (ICD10-I70.0). Electronically Signed   By: Lowella Grip III M.D.   On: 02/21/2017 19:54   Dg Tibia/fibula Right  Result Date: 02/21/2017 CLINICAL DATA:  Pain following along EXAM: RIGHT TIBIA AND FIBULA - 2  VIEW COMPARISON:  April 14, 2012 FINDINGS: Frontal and lateral views were obtained. There is no acute fracture or dislocation. Bones are osteoporotic. Patient is status post total knee replacement with femoral and tibial prosthetic components well-seated. There are foci of atherosclerotic calcification in the distal superficial femoral artery, popliteal artery, and proximal trifurcation arteries. A stent is noted in the distal left superficial femoral artery. IMPRESSION: Bones osteoporotic. No acute fracture or dislocation. Total knee replacement with prosthetic components well-seated. Multiple foci of arterial atherosclerotic calcification. Electronically Signed   By: Lowella Grip III M.D.   On: 02/21/2017 19:55   Ct Head Wo Contrast  Result Date: 02/21/2017 CLINICAL DATA:  Fall, on anti coagulation. EXAM: CT HEAD WITHOUT CONTRAST TECHNIQUE: Contiguous axial images were obtained from the base of the skull through the vertex without intravenous contrast. COMPARISON:  01/30/2013. FINDINGS: Brain: There is atrophy and chronic  small vessel disease changes. No acute intracranial abnormality. Specifically, no hemorrhage, hydrocephalus, mass lesion, acute infarction, or significant intracranial injury. Vascular: No hyperdense vessel or unexpected calcification. Skull: No acute calvarial abnormality. Sinuses/Orbits: Air-fluid level in the left maxillary sinus. Other: None IMPRESSION: No acute intracranial abnormality. Atrophy, chronic microvascular disease. Acute sinusitis changes in the left maxillary sinus. Electronically Signed   By: Rolm Baptise M.D.   On: 02/21/2017 20:18    Assessment & Plan:   There are no diagnoses linked to this encounter. I am having [name] maintain her glucose blood, B-D ULTRA-FINE 33 LANCETS, ELIQUIS, atorvastatin, ADVAIR DISKUS, SPIRIVA HANDIHALER, MUCINEX, levalbuterol, metFORMIN, acetaminophen, levalbuterol, predniSONE, dextromethorphan, furosemide, polyethylene glycol, senna-docusate, potassium chloride SA, escitalopram, and doxycycline.  No orders of the defined types were placed in this encounter.    Follow-up: No Follow-up on file.  Walker Kehr, MD

## 2017-03-05 NOTE — Assessment & Plan Note (Signed)
Cont O2 Xopenex HHN qid Depomedrol 80 mg IM Re-started Advair

## 2017-03-05 NOTE — Assessment & Plan Note (Signed)
Eliquis 

## 2017-03-11 ENCOUNTER — Other Ambulatory Visit (INDEPENDENT_AMBULATORY_CARE_PROVIDER_SITE_OTHER): Payer: Medicare Other

## 2017-03-11 ENCOUNTER — Encounter: Payer: Self-pay | Admitting: Internal Medicine

## 2017-03-11 ENCOUNTER — Ambulatory Visit (INDEPENDENT_AMBULATORY_CARE_PROVIDER_SITE_OTHER): Payer: Medicare Other | Admitting: Internal Medicine

## 2017-03-11 ENCOUNTER — Telehealth: Payer: Self-pay | Admitting: Internal Medicine

## 2017-03-11 ENCOUNTER — Telehealth: Payer: Self-pay

## 2017-03-11 DIAGNOSIS — J9621 Acute and chronic respiratory failure with hypoxia: Secondary | ICD-10-CM | POA: Diagnosis not present

## 2017-03-11 DIAGNOSIS — J189 Pneumonia, unspecified organism: Secondary | ICD-10-CM | POA: Diagnosis not present

## 2017-03-11 DIAGNOSIS — I5032 Chronic diastolic (congestive) heart failure: Secondary | ICD-10-CM

## 2017-03-11 DIAGNOSIS — F17211 Nicotine dependence, cigarettes, in remission: Secondary | ICD-10-CM | POA: Diagnosis not present

## 2017-03-11 LAB — BASIC METABOLIC PANEL
BUN: 23 mg/dL (ref 6–23)
CALCIUM: 9.3 mg/dL (ref 8.4–10.5)
CO2: 31 mEq/L (ref 19–32)
CREATININE: 1.31 mg/dL — AB (ref 0.40–1.20)
Chloride: 103 mEq/L (ref 96–112)
GFR: 41.14 mL/min — ABNORMAL LOW (ref >60.00)
GLUCOSE: 36 mg/dL — AB (ref 70–99)
Potassium: 3.9 mEq/L (ref 3.5–5.1)
SODIUM: 143 meq/L (ref 135–145)

## 2017-03-11 NOTE — Telephone Encounter (Signed)
[facility] - [phone] she called in to get clarity. Charity said that pt had an apt in the office today. They sent pt with current medication list so that the PCP could update system. Pt says that when pt returned her current medication list was not updated. She would like to discuss further.   Please call back.   Thanks.

## 2017-03-11 NOTE — Assessment & Plan Note (Signed)
Suspect due to aspiration. She is currently on omnicef and prednisone. She is not understanding that aspiration could be causing the recurrence of the cough and she wants it to be gone. She does not want to drink thickened liquids.

## 2017-03-11 NOTE — Assessment & Plan Note (Signed)
Still taking prednisone and omnicef currently. Reminded to get her BMP done so she can get her CT lung with contrast. Suspect chronic aspiration contributing to her cough and sputum production.

## 2017-03-11 NOTE — Assessment & Plan Note (Signed)
Still not smoking and given that she is in a facility it would be difficult for her to resume. Reminded not to smoke.

## 2017-03-11 NOTE — Telephone Encounter (Signed)
CRITICAL VALUE STICKER  CRITICAL VALUE:glucose 49  RECEIVER (on-site recipient of call):Krystl Wickware,RN  DATE & TIME NOTIFIED: 03/11/17 1548  MESSENGER (representative from lab):hope  MD NOTIFIED: crawford  TIME OF NOTIFICATION:03/11/17 4540  RESPONSE: pending

## 2017-03-11 NOTE — Telephone Encounter (Signed)
Can call patient but no symptoms during office visit. Likely has eaten since the test.

## 2017-03-11 NOTE — Telephone Encounter (Signed)
Daughter is checking with facility where her mother stays to see if she has eaten,  if they have checked her glucose this afternoon, and if she is having any symptoms---she is calling Josephine Rudnick,RN at [place] office back with that information

## 2017-03-11 NOTE — Progress Notes (Signed)
   Subjective:    Patient ID: [name], female    DOB: [birth year], 84 y.o.   MRN: [id number]  HPI The patient is an 84 YO female coming in for follow up of her breathing. She has done several courses of antibiotics in the last month. She is still on steroids daily per pulmonary. She is still taking omnicef until 03/22/16 per recent visit at this office and is awaiting repeat CT chest with contast to evaluate her lungs. She is feeling improved on the omnicef. She is still using oxygen all the time. She does get SOB and has leg swelling which is stable. She eats all the foods at her living place without considering salt. She figures if they give it to her she can eat it. Does not use added salt after prep.   Review of Systems  Constitutional: Positive for activity change. Negative for appetite change, chills, fatigue, fever and unexpected weight change.  HENT: Negative for congestion, ear discharge, ear pain, postnasal drip, rhinorrhea, sinus pressure, sinus pain, sneezing, sore throat, tinnitus, trouble swallowing and voice change.   Eyes: Negative.   Respiratory: Positive for cough and shortness of breath. Negative for chest tightness and wheezing.   Cardiovascular: Positive for leg swelling. Negative for chest pain and palpitations.  Gastrointestinal: Negative.   Endocrine: Negative.   Musculoskeletal: Negative.   Skin: Negative.   Neurological: Negative.   Hematological: Negative.       Objective:   Physical Exam  Constitutional: She is oriented to person, place, and time. She appears well-developed and well-nourished.  HENT:  Head: Normocephalic and atraumatic.  Eyes: EOM are normal.  Neck: Normal range of motion.  Cardiovascular: Normal rate and regular rhythm.  Pulmonary/Chest: Effort normal. No respiratory distress. She has no wheezes. She has no rales.  Wearing oxygen, some rhonchi at the bases bilaterally which is stable from prior exam  Abdominal: Soft. Bowel sounds are  normal. She exhibits no distension. There is no tenderness. There is no rebound.  Musculoskeletal: She exhibits edema.  1-2+ edema to mid shins bilaterally.   Neurological: She is alert and oriented to person, place, and time. Coordination normal.  Skin: Skin is warm and dry.   Vitals:   03/11/17 0818  BP: 118/60  Pulse: 71  Temp: 97.7 F (36.5 C)  TempSrc: Oral  SpO2: 98%  Weight: 173 lb (78.5 kg)  Height: 5\' 6"  (1.676 m)      Assessment & Plan:

## 2017-03-11 NOTE — Patient Instructions (Signed)
We are checking the labs today for the CT scan of the chest.

## 2017-03-11 NOTE — Assessment & Plan Note (Signed)
No flare today, SOB is stable and edema stable. BMP checking to look at kidney function. She is reminded again about skipping some known high salt foods so she does not have as much salt intake. She does not need to eat all foods offered to her.

## 2017-03-12 NOTE — Telephone Encounter (Signed)
LVM for [facility] to call back to go over medication list

## 2017-03-16 ENCOUNTER — Ambulatory Visit: Payer: Medicare Other | Admitting: Internal Medicine

## 2017-03-17 ENCOUNTER — Ambulatory Visit (INDEPENDENT_AMBULATORY_CARE_PROVIDER_SITE_OTHER)
Admission: RE | Admit: 2017-03-17 | Discharge: 2017-03-17 | Disposition: A | Discharge status: Home / Self Care | Payer: Medicare Other | Source: Ambulatory Visit | Attending: Internal Medicine | Admitting: Internal Medicine

## 2017-03-17 DIAGNOSIS — J189 Pneumonia, unspecified organism: Secondary | ICD-10-CM | POA: Diagnosis not present

## 2017-03-17 DIAGNOSIS — J181 Lobar pneumonia, unspecified organism: Secondary | ICD-10-CM | POA: Diagnosis not present

## 2017-03-17 DIAGNOSIS — I5032 Chronic diastolic (congestive) heart failure: Secondary | ICD-10-CM | POA: Diagnosis not present

## 2017-03-17 MED ORDER — IOPAMIDOL (ISOVUE-300) INJECTION 61%
65.0000 mL | Freq: Once | INTRAVENOUS | Status: AC | PRN
  Administered 2017-03-17: 65 mL via INTRAVENOUS

## 2017-03-22 ENCOUNTER — Other Ambulatory Visit: Payer: Self-pay | Admitting: Internal Medicine

## 2017-03-24 ENCOUNTER — Encounter: Payer: Self-pay | Admitting: Pulmonary Disease

## 2017-03-24 ENCOUNTER — Ambulatory Visit: Payer: Medicare Other | Admitting: Pulmonary Disease

## 2017-03-24 ENCOUNTER — Ambulatory Visit (INDEPENDENT_AMBULATORY_CARE_PROVIDER_SITE_OTHER): Payer: Medicare Other | Admitting: Pulmonary Disease

## 2017-03-24 VITALS — BP 122/80 | HR 72 | Ht 67.5 in | Wt 170.6 lb

## 2017-03-24 DIAGNOSIS — T17908A Unspecified foreign body in respiratory tract, part unspecified causing other injury, initial encounter: Secondary | ICD-10-CM

## 2017-03-24 DIAGNOSIS — R911 Solitary pulmonary nodule: Secondary | ICD-10-CM

## 2017-03-24 DIAGNOSIS — G4733 Obstructive sleep apnea (adult) (pediatric): Secondary | ICD-10-CM

## 2017-03-24 DIAGNOSIS — J449 Chronic obstructive pulmonary disease, unspecified: Secondary | ICD-10-CM | POA: Diagnosis not present

## 2017-03-24 DIAGNOSIS — J4489 Other specified chronic obstructive pulmonary disease: Secondary | ICD-10-CM

## 2017-03-24 MED ORDER — FLUTTER DEVI: 0 refills | Status: DC

## 2017-03-24 MED ORDER — AZITHROMYCIN 250 MG PO TABS: ORAL_TABLET | ORAL | 0 refills | Status: DC

## 2017-03-24 MED ORDER — AZITHROMYCIN 500 MG PO TABS: 500.0000 mg | ORAL_TABLET | Freq: Every day | ORAL | 0 refills | Status: DC

## 2017-03-24 NOTE — Progress Notes (Signed)
[name]    [id number]    04/10/1931  Primary Care Physician:Crawford, Real Cons, MD  Referring Physician: Hoyt Koch, MD [place], [place] [id number]  Chief complaint:   Follow up for COPD GOLD C Heavy ex smoker OSA on CPAP with 2Lt O2  HPI: [name] has history of COPD with frequent exacerbations in the past. She is a former patient of [name] and a heavy former smoker. She smoked one and half to 3 packs per day for nearly 60 years. She quit 2013.  She underwent right hip and total knee replacement in the past. She has apparently tolerated these procedures well. She has history of atrial fibrillation and failed several ablations. She has a permanent pacemaker. 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 She is not on amiodarone at present.  [name] was hospitalized in Feb 2018 with left lower lobe community-acquired pneumonia, sepsis, acute systolic heart failure, AKI. She was initially in the ICU requiring pressor, Bipap support. Echo showed EF 25-30 percent and she had elevated troponins which was felt to be secondary to demand ischemia.  Interim History: Hospitalized again in September with acute respiratory failure, hypoxia, right lower lobe pneumonia.  She was treated for aspiration pneumonia with Zosyn and BiPAP.  Evaluated by speech therapy and noted to have chronic aspiration.  Recommended dysphagia diet and aspiration precautions.  She is not following these instructions in her nursing home. She continues to have recurrent respiratory tract infection treated with prednisone tapers, Augmentin and Omnicef in December. Noncompliant with CPAP.   Outpatient Encounter Medications as of 03/24/2017  Medication Sig  . acetaminophen (TYLENOL) 325 MG tablet Take 650 mg by mouth every 6 (six) hours as needed for mild pain or fever.    Marland Kitchen atorvastatin (LIPITOR) 20 MG tablet TAKE 1 TABLET EVERY DAY  . B-D ULTRA-FINE 33 LANCETS MISC Use to help check blood sugars daily Dx E11.9  . cefdinir (OMNICEF) 300 MG capsule Take 1 capsule (300 mg total) by mouth 2 (two) times daily.  Marland Kitchen Dextromethorphan-Guaifenesin (ROBAFEN DM) 10-100 MG/5ML liquid Take 10 mLs by mouth every 12 (twelve) hours as needed.  Marland Kitchen ELIQUIS 5 MG TABS tablet TAKE 1 TABLET TWICE DAILY  (MUST  ESTABLISH  NEW  PRIMARY CARE PROVIDER  FOR FUTURE REFILLS)  . escitalopram (LEXAPRO) 10 MG tablet   . Fluticasone-Salmeterol (ADVAIR DISKUS) 250-50 MCG/DOSE AEPB Inhale 1 puff into the lungs 2 (two) times daily.  . furosemide (LASIX) 40 MG tablet Take 1 tablet (40 mg total) by mouth 2 (two) times daily.  Marland Kitchen glipiZIDE (GLUCOTROL) 5 MG tablet Take 5 mg by mouth daily before breakfast.  . glucose blood (BAYER CONTOUR TEST) test strip 1 each by Other route daily. Use to check blood sugars every day Dx E11.9  . levalbuterol (XOPENEX HFA) 45 MCG/ACT inhaler Inhale 2 puffs into the lungs every 6 (six) hours as needed. Reported on 09/23/2015  . levalbuterol (XOPENEX) 0.63 MG/3ML nebulizer solution Take 0.63 mg by nebulization every 4 (four) hours as needed for wheezing or shortness of breath.  Marland Kitchen LORazepam (ATIVAN) 0.5 MG tablet Take 0.5 mg by mouth 2 (two) times daily.  Marland Kitchen MEMANTINE HCL ER PO Take 500 mg by mouth daily.  . metFORMIN (GLUCOPHAGE) 500 MG tablet TAKE 1 TABLET TWICE DAILY WITH A MEAL  .  mometasone-formoterol (DULERA) 200-5 MCG/ACT AERO Inhale 1 puff into the lungs 2 (two) times daily.  Marland Kitchen MUCINEX 600 MG 12 hr tablet TAKE ONE TABLET TWICE DAILY  . polyethylene glycol (MIRALAX / GLYCOLAX) packet Take 17 g by mouth 2 (two) times daily.  . potassium chloride SA (K-DUR,KLOR-CON) 20 MEQ tablet Take 1 tablet (20 mEq total) by mouth 2 (two) times daily.  . predniSONE (DELTASONE) 20 MG tablet Take 20 mg by mouth daily with breakfast.  . senna-docusate (SENOKOT-S) 8.6-50 MG tablet Take  1 tablet by mouth 2 (two) times daily.  Marland Kitchen SPIRIVA HANDIHALER 18 MCG inhalation capsule INHALE THE CONTENTS OF 1 CAPSULE EVERY DAY   No facility-administered encounter medications on file as of 03/24/2017.     Allergies as of 03/24/2017 - Review Complete 03/24/2017  Allergen Reaction Noted  . Cholestatin Other (See Comments) 09/02/2010  . Sulfur Itching 01/13/2013    Past Medical History:  Diagnosis Date  . Acute blood loss anemia 04/20/2012  . AF (atrial fibrillation) ([place])     AV ablation 9/09 [place] per Dr Ola Spurr - AV node ablation 9/11 Dr Caryl Comes  . Amiodarone pulmonary toxicity   . Ascending aortic aneurysm ([place]) 12/11/2016  . Asthma   . Bronchitis    hx of   . [name] (coronary artery disease)    (not sure of this 11/10  . CHF (congestive heart failure) ([place])   . Chronic renal insufficiency, stage III (moderate) (HCC)    CrCl about 60 ml/min  . CKD (chronic kidney disease) stage 3, GFR 30-59 ml/min (HCC)   . Complication of anesthesia    "psycotic episode" after hip surg - resolved  . COPD (chronic obstructive pulmonary disease) (H[facility])    emphysema -FeV1 73% DLCO 53% 5/09  . CVA (cerebral vascular accident) ([place])    no residual effects evident to pt   . Depression 06/06/2012  . Diabetes ([place])   . Diastolic heart failure    Acute on Chronic  . Eczema   . GERD (gastroesophageal reflux disease)   . History of skin cancer   . Hx of cardiovascular stress test    Lexiscan Myoview (09/2013):  No definite ischemia, EF 67%; low risk  . Hyperlipidemia   . Invasive ductal carcinoma of breast ([place]) 2011   LEFT   . Mental disorder   . OA (osteoarthritis) of knee    RIGHT  . Pacemaker    Permanent  . PAD (peripheral artery disease) (HCC)    w/hx right iliac/SFA stenting and left and rt leg PTA  . Pain    pt states has pain in fingertips per right hand pt states has been told may be carpal tunnel  . Pneumonia    hx of   . Right sided sciatica   . Shortness of breath dyspnea     walking distances   . Sinoatrial node dysfunction (H[facility])   . Sleep apnea    associated with hypersomnia uses O2 2L/M at night and during naps also uses CPAP  . Tobacco abuse   . Tremors of nervous system    hands bilat     Past Surgical History:  Procedure Laterality Date  . ATRIAL ABLATION SURGERY     x 2 - "did not help - had to have pacemaker"  . BREAST LUMPECTOMY     left breast  . CARDIAC CATHETERIZATION    . CATARACT EXTRACTION, BILATERAL     with IOL/Dr Katy Fitch  . EP IMPLANTABLE DEVICE N/A 02/13/2015  Procedure: PPM Generator Changeout-St. Jude device;  Surgeon: Deboraha Sprang, MD;  Location: [facility];  Service: Cardiovascular;  Laterality: N/A;  . MASTECTOMY, PARTIAL  02/03/2010   Left/Dr Rosenbower  . ORIF ACETABULAR FRACTURE Left 04/19/2012   Procedure: OPEN REDUCTION INTERNAL FIXATION (ORIF) ACETABULAR FRACTURE;  Surgeon: Rozanna Box, MD;  Location: [facility];  Service: Orthopedics;  Laterality: Left;  . sun spot removed      forhead  . TOTAL HIP ARTHROPLASTY Left 10/31/2014   Procedure: LEFT TOTAL HIP ARTHROPLASTY POSTERIOR  APPROACH;  Surgeon: Gaynelle Arabian, MD;  Location: [facility];  Service: Orthopedics;  Laterality: Left;  . TOTAL KNEE ARTHROPLASTY Right 10/16/2013   Procedure: RIGHT TOTAL KNEE ARTHROPLASTY;  Surgeon: Gearlean Alf, MD;  Location: [facility];  Service: Orthopedics;  Laterality: Right;  Marland Kitchen VASCULAR SURGERY     both legs     Family History  Problem Relation Age of Onset  . Heart disease Father     Social History   Socioeconomic History  . Marital status: Married    Spouse name: Not on file  . Number of children: Not on file  . Years of education: Not on file  . Highest education level: Not on file  Social Needs  . Financial resource strain: Not on file  . Food insecurity - worry: Not on file  . Food insecurity - inability: Not on file  . Transportation needs - medical: Not on file  . Transportation needs - non-medical: Not on file   Occupational History  . Not on file  Tobacco Use  . Smoking status: Former Smoker    Packs/day: 1.00    Years: 55.00    Pack years: 55.00    Types: Cigarettes    Last attempt to quit: 12/19/2011    Years since quitting: 5.2  . Smokeless tobacco: Never Used  . Tobacco comment: started smoking at age 81--4 cigs per day.  smoked 2ppd the last 10 yrs  Substance and Sexual Activity  . Alcohol use: Yes    Comment: wine (rare)  . Drug use: No  . Sexual activity: No  Other Topics Concern  . Not on file  Social History Narrative   HS Graduate; [employer].  Married '61.  2 sons - '70, '71; Dtrs - '64,'68;    6 grandchildren.  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 Interests -Tai-chi & water aerobics, gardening, active lifestyle.  Marriage a bit stressful - SO w/membory problems and difficult behavior. She denies any personal safety concerns. End of life care: need to address at next OV             Review of systems: Review of Systems  Constitutional: Negative for fever and chills.  HENT: Negative.   Eyes: Negative for blurred vision.  Respiratory: as per HPI  Cardiovascular: Negative for chest pain and palpitations.  Gastrointestinal: Negative for vomiting, diarrhea, blood per rectum. Genitourinary: Negative for dysuria, urgency, frequency and hematuria.  Musculoskeletal: Negative for myalgias, back pain and joint pain.  Skin: Negative for itching and rash.  Neurological: Negative for dizziness, tremors, focal weakness, seizures and loss of consciousness.  Endo/Heme/Allergies: Negative for environmental allergies.  Psychiatric/Behavioral: Negative for depression, suicidal ideas and hallucinations.  All other systems reviewed and are negative.  Physical Exam: Blood pressure 122/80, pulse 72, height 5' 7.5" (1.715 m),  weight 170 lb 9.6 oz (77.4 kg), SpO2 92 %. Gen:      No acute distress HEENT:  EOMI, sclera anicteric Neck:     No masses; no thyromegaly Lungs:    Clear to auscultation bilaterally; normal respiratory effort CV:         Regular rate and rhythm; no murmurs Abd:      + bowel sounds; soft, non-tender; no palpable masses, no distension Ext:    No edema; adequate peripheral perfusion Skin:      Warm and dry; no rash Neuro: alert and oriented x 3 Psych: normal mood and affect  Data Reviewed: Chest x-ray 04/25/16-left lower lobe airspace consolidation. Chest x-ray 05/15/16-improvement in left lower lobe consolidation Chest x-ray 06/17/16-hyperinflation, chronic interstitial changes. No acute abnormality Chest x-ray 07/27/16- hyperinflation, chronic interstitial changes. No change from before. Chest x-ray 08/21/16- hyperinflation, chronic interstitial changes. No change from before.  CT chest 11/29/16- moderate emphysema, groundglass density and consolidation in the right lower lobe.  15 mm nodular density in the left apical lung, mediastinal lymphadenopathy CT chest 03/17/17-persistence of right lobe lower lobe consolidation with worsening opacities in the right midlung endobronchial mucus. Previously noted left upper lobe nodule appears more bulky. I have reviewed all images personally.  PFTs (02/20/10) FVC 1.66 (56%), FEV1 1.289 (59%), F/F 74   Assessment:  #1 Left upper lobe nodule First noticed on CT in September.  Her CT scan from last week shows it is more prominent.  This is suspicious for malignancy.  I will get a PET scan for further evaluation I have explained this to [name] and family today and discussed further steps.  I do not think she will be able to tolerate an invasive procedure such as a biopsy given her general frailty and the condition of her lung.  Will need to discuss with her and her family about this once the PET results are back.  #2 COPD GOLD D Recurrent  aspiration History of recurrent respiratory tract infections.  She is noncompliant with aspiration precautions and dysphagia diet I have printed out the instructions from speech eval and given to her and her nursing home to follow the recommendations We will refer her to swallow and speech therapy again to reemphasize aspiration precautions. Use Mucinex and start flutter wall for clearance of secretion endobronchial mucus. Give her a course of azithromycin as the latest CT scan shows persistent pneumonia .  #3 Code status DNR/DNI. Seen by palliative care during last admission. Will need reassessment once PET scan is done  Plan/Recommendations: - PET scan - Azithromycin - Mucinex, flutter valve  Marshell Garfinkel MD [facility] Pager [phone] 03/24/2017, 12:26 PM  CC: Hoyt Koch, *

## 2017-03-24 NOTE — Patient Instructions (Addendum)
1) Schedule you for a PET scan for further evaluation of your lung nodule 2) We will give you azithromycin 500 mg for 7 days 3) We will give you a flutter valve and Mucinex for clearance of secretion 4) Please follow the aspiration precaution.  We will refer you to speech therapy for further training on aspiration precaution 5) Follow-up in 2 weeks for discussion and plan for further therapy.

## 2017-03-25 ENCOUNTER — Telehealth: Payer: Self-pay | Admitting: Internal Medicine

## 2017-03-25 ENCOUNTER — Telehealth: Payer: Self-pay | Admitting: Pulmonary Disease

## 2017-03-25 MED ORDER — AZITHROMYCIN 500 MG PO TABS: 500.0000 mg | ORAL_TABLET | Freq: Every day | ORAL | 0 refills | Status: DC

## 2017-03-25 NOTE — Telephone Encounter (Signed)
Tried to call daughter back three separate times and each time the phone cut out or made a loud cracking noise. I was not able to relay MD response. Daughter will most likely be trying to call back and MD response can be given

## 2017-03-25 NOTE — Telephone Encounter (Signed)
Spoke with Charity at [facility] nursing [facility], states that med list was not updated at pt's OV yesterday.  Charity is faxing over current med list so this can be updated in our system.   Also, rx for azithromycin was sent to pt's former local pharmacy, needs a hard copy rx printed and faxed to facility's pharmacy so nurses can administer rx to pt.   [facility]- fax: [phone]  Rx has been printed and faxed to pharmacy.  Will await med list.

## 2017-03-25 NOTE — Telephone Encounter (Signed)
The glipizide should be current per my understanding. I have no control over whether pharmacy is in the chart. She can request paper scripts at visits.

## 2017-03-25 NOTE — Telephone Encounter (Signed)
Pt's daughter Sharlene Motts called stating that she is concerned about 2 things:   1.)  There have been changes in her mothers medication that are not reflected on her medication list; she says that the medications in question are glucotrol and metformin; spoke with [name] at [facility] and it was noted that on 02/08/17 per Dr Sharlet Salina, Changed metformin to long acting to reduce pill burden. Keep glipizide daily. Asked ALF to check sugars twice weekly since she is on the prednisone long term we may need increasing therapy."; Enid Derry also stated that her sister in law had spoken with Dr Sharlet Salina and was told "that the glipizide was going to be dropped";  spoke with [name] at [facility] and she states that they will send the updated information for the metformin to the facility   2.) Enid Derry expresses concern that the pt is having prescriptions filled and is taking medication in her room; she would like for her mother to have paper prescriptions only and would like to have the option of pharmacy taken out of her mother's chart because a specialty pharmacy is used at [facility] where her mother lives; the fax number at [facility] is [phone] ATTN: Carloyn Jaeger; will route this to [facility] for clarification of medications and the request for change to paper prescriptions only; the pt's daughter can be contacted at [phone].

## 2017-03-26 ENCOUNTER — Telehealth: Payer: Self-pay

## 2017-03-26 MED ORDER — MEMANTINE HCL ER 28 MG PO CP24: 28.0000 mg | ORAL_CAPSULE | Freq: Every day | ORAL | Status: DC

## 2017-03-26 MED ORDER — METFORMIN HCL ER 500 MG PO TB24: 500.0000 mg | ORAL_TABLET | Freq: Every day | ORAL | Status: AC

## 2017-03-26 NOTE — Telephone Encounter (Signed)
My cart message

## 2017-03-26 NOTE — Telephone Encounter (Signed)
Phone call received from DIL Dr. Radford Pax. She did not identify herself as calling in about patient but as cardiologist needing to talk to Dr. Nathanial Millman assistant. She is requesting medication list to have several updates which are done. She is requesting to know if glipizide is still current and yes it is still current. She is also worried about if lexapro is working for her mother. She states this is for our information only. She thinks she is having some hallucinations. She thinks people have been stealing from her and they cannot find that anything is stolen. Omnicef removed from list as it is complete.   It is not appropriate for Dr. Radford Pax to call in representing herself as MD wanting to consult about a patient if she is calling in as a relative of a patient she should go through our phone system as any other patient or family member. Will contact Dr. Radford Pax separately to ask her to go through proper channels to avoid confusion.

## 2017-03-26 NOTE — Telephone Encounter (Signed)
Med list updated and placed up front for daughter in-law to pick up.

## 2017-03-26 NOTE — Addendum Note (Signed)
Addended by: Pricilla Holm A on: 03/26/2017 02:52 PM   Modules accepted: Orders

## 2017-03-30 ENCOUNTER — Telehealth: Payer: Self-pay | Admitting: Pulmonary Disease

## 2017-03-30 NOTE — Telephone Encounter (Signed)
Did not see med list in PM's folder. MB please advise if you have this med list.  Thanks!@

## 2017-03-30 NOTE — Telephone Encounter (Signed)
Left message for [facility] to call back.

## 2017-03-30 NOTE — Telephone Encounter (Signed)
Spoke with [facility], she states the pt had azithromycin Rx  and wants to know if the finish date should be for yesterday or today? She accidentally took two on one day and now she does not have another pill to take today for day 7. Should they give pt another pill or just stop treatment. PM please advise.   They are faxing over a document for PM to fill out. I will place in PM folder when received.

## 2017-03-30 NOTE — Telephone Encounter (Signed)
Faxed signed form back to [facility]. Nothing further is needed.

## 2017-03-30 NOTE — Telephone Encounter (Signed)
Pt is calling back [phone]

## 2017-03-30 NOTE — Telephone Encounter (Signed)
Refaxed the document. Stop date is 1/22 for azithromycin. Thanks

## 2017-03-31 NOTE — Telephone Encounter (Signed)
Situation has already been addressed, see phone note from 1/22.

## 2017-04-05 DIAGNOSIS — C3490 Malignant neoplasm of unspecified part of unspecified bronchus or lung: Secondary | ICD-10-CM | POA: Diagnosis not present

## 2017-04-05 DIAGNOSIS — I4891 Unspecified atrial fibrillation: Secondary | ICD-10-CM | POA: Diagnosis not present

## 2017-04-05 DIAGNOSIS — E7849 Other hyperlipidemia: Secondary | ICD-10-CM | POA: Diagnosis not present

## 2017-04-05 DIAGNOSIS — E119 Type 2 diabetes mellitus without complications: Secondary | ICD-10-CM | POA: Diagnosis not present

## 2017-04-05 DIAGNOSIS — E669 Obesity, unspecified: Secondary | ICD-10-CM | POA: Diagnosis not present

## 2017-04-05 DIAGNOSIS — I5089 Other heart failure: Secondary | ICD-10-CM | POA: Diagnosis not present

## 2017-04-05 DIAGNOSIS — G308 Other Alzheimer's disease: Secondary | ICD-10-CM | POA: Diagnosis not present

## 2017-04-05 DIAGNOSIS — J449 Chronic obstructive pulmonary disease, unspecified: Secondary | ICD-10-CM | POA: Diagnosis not present

## 2017-04-06 DIAGNOSIS — Z79899 Other long term (current) drug therapy: Secondary | ICD-10-CM | POA: Diagnosis not present

## 2017-04-06 DIAGNOSIS — F0391 Unspecified dementia with behavioral disturbance: Secondary | ICD-10-CM | POA: Diagnosis not present

## 2017-04-07 ENCOUNTER — Encounter (HOSPITAL_COMMUNITY): Payer: Self-pay

## 2017-04-07 ENCOUNTER — Ambulatory Visit (HOSPITAL_COMMUNITY)
Admission: RE | Admit: 2017-04-07 | Discharge: 2017-04-07 | Disposition: A | Discharge status: Home / Self Care | Payer: Medicare Other | Source: Ambulatory Visit | Attending: Pulmonary Disease | Admitting: Pulmonary Disease

## 2017-04-07 DIAGNOSIS — R918 Other nonspecific abnormal finding of lung field: Secondary | ICD-10-CM | POA: Diagnosis not present

## 2017-04-07 DIAGNOSIS — R911 Solitary pulmonary nodule: Secondary | ICD-10-CM | POA: Diagnosis not present

## 2017-04-07 DIAGNOSIS — I700 Atherosclerosis of aorta: Secondary | ICD-10-CM | POA: Insufficient documentation

## 2017-04-07 DIAGNOSIS — K5730 Diverticulosis of large intestine without perforation or abscess without bleeding: Secondary | ICD-10-CM | POA: Diagnosis not present

## 2017-04-07 DIAGNOSIS — N200 Calculus of kidney: Secondary | ICD-10-CM | POA: Diagnosis not present

## 2017-04-07 DIAGNOSIS — I2510 Atherosclerotic heart disease of native coronary artery without angina pectoris: Secondary | ICD-10-CM | POA: Diagnosis not present

## 2017-04-07 DIAGNOSIS — S2241XA Multiple fractures of ribs, right side, initial encounter for closed fracture: Secondary | ICD-10-CM | POA: Diagnosis not present

## 2017-04-07 DIAGNOSIS — I714 Abdominal aortic aneurysm, without rupture: Secondary | ICD-10-CM | POA: Diagnosis not present

## 2017-04-07 DIAGNOSIS — X58XXXA Exposure to other specified factors, initial encounter: Secondary | ICD-10-CM | POA: Diagnosis not present

## 2017-04-07 LAB — GLUCOSE, CAPILLARY: Glucose-Capillary: 94 mg/dL (ref 65–99)

## 2017-04-07 MED ORDER — FLUDEOXYGLUCOSE F - 18 (FDG) INJECTION
8.4300 | Freq: Once | INTRAVENOUS | Status: AC
  Administered 2017-04-07: 8.43 via INTRAVENOUS

## 2017-04-09 ENCOUNTER — Encounter: Payer: Self-pay | Admitting: Pulmonary Disease

## 2017-04-09 ENCOUNTER — Ambulatory Visit (INDEPENDENT_AMBULATORY_CARE_PROVIDER_SITE_OTHER): Payer: Medicare Other | Admitting: Pulmonary Disease

## 2017-04-09 VITALS — BP 110/80 | HR 63 | Ht 65.0 in | Wt 173.0 lb

## 2017-04-09 DIAGNOSIS — J449 Chronic obstructive pulmonary disease, unspecified: Secondary | ICD-10-CM | POA: Diagnosis not present

## 2017-04-09 DIAGNOSIS — C3412 Malignant neoplasm of upper lobe, left bronchus or lung: Secondary | ICD-10-CM

## 2017-04-09 DIAGNOSIS — J9801 Acute bronchospasm: Secondary | ICD-10-CM | POA: Diagnosis not present

## 2017-04-09 DIAGNOSIS — R911 Solitary pulmonary nodule: Secondary | ICD-10-CM

## 2017-04-09 MED ORDER — PREDNISONE 10 MG PO TABS: 10.0000 mg | ORAL_TABLET | Freq: Every day | ORAL | 0 refills | Status: DC

## 2017-04-09 MED ORDER — HYDROCOD POLST-CPM POLST ER 10-8 MG/5ML PO SUER: 5.0000 mL | Freq: Two times a day (BID) | ORAL | 0 refills | Status: DC

## 2017-04-09 MED ORDER — BENZONATATE 200 MG PO CAPS: 200.0000 mg | ORAL_CAPSULE | Freq: Two times a day (BID) | ORAL | 1 refills | Status: DC | PRN

## 2017-04-09 NOTE — Progress Notes (Addendum)
[name]    [id number]    November 10, 1931  Primary Care Physician:Crawford, Real Cons, MD  Referring Physician: Hoyt Koch, MD [address] [place], [place] [address]  Chief complaint:   Follow up for COPD GOLD C Heavy ex smoker OSA on CPAP with 2Lt O2 (non compliant with CPAP) PET positive lung nodule.  HPI: [name] has history of COPD with frequent exacerbations in the past. She is a former patient of [name] and a heavy former smoker. She smoked one and half to 3 packs per day for nearly 60 years. She quit 2013.  She underwent right hip and total knee replacement in the past. She has apparently tolerated these procedures well. She has history of atrial fibrillation and failed several ablations. She has a permanent pacemaker. 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 She is not on amiodarone at present.  [name] was hospitalized in Feb 2018 with left lower lobe community-acquired pneumonia, sepsis, acute systolic heart failure, AKI. She was initially in the ICU requiring pressor, Bipap support. Echo showed EF 25-30 percent and she had elevated troponins which was felt to be secondary to demand ischemia.  Subsequent follow-up echo in June 2018 shows recovery of EF 55%.  Hospitalized again in September 2018 with acute respiratory failure, hypoxia, right lower lobe pneumonia.  She was treated for aspiration pneumonia with Zosyn and BiPAP.  Evaluated by speech therapy and noted to have chronic aspiration.  Recommended dysphagia diet and aspiration precautions.    Interim History: Treated with additional Z-Pak at last visit.  She had a PET scan for evaluation of left upper lobe nodule and is here for discussion of results She reports chronic cough, chest congestion.  Denies any fevers, chills.  Outpatient Encounter Medications as of 04/09/2017    Medication Sig  . acetaminophen (TYLENOL) 325 MG tablet Take 650 mg by mouth every 6 (six) hours as needed for mild pain or fever.  Marland Kitchen atorvastatin (LIPITOR) 20 MG tablet TAKE 1 TABLET EVERY DAY  . B-D ULTRA-FINE 33 LANCETS MISC Use to help check blood sugars daily Dx E11.9  . Dextromethorphan-Guaifenesin (ROBAFEN DM) 10-100 MG/5ML liquid Take 10 mLs by mouth every 12 (twelve) hours as needed.  Marland Kitchen ELIQUIS 5 MG TABS tablet TAKE 1 TABLET TWICE DAILY  (MUST  ESTABLISH  NEW  PRIMARY CARE PROVIDER  FOR FUTURE REFILLS)  . escitalopram (LEXAPRO) 10 MG tablet   . Fluticasone-Salmeterol (ADVAIR DISKUS) 250-50 MCG/DOSE AEPB Inhale 1 puff into the lungs 2 (two) times daily.  . furosemide (LASIX) 40 MG tablet Take 1 tablet (40 mg total) by mouth 2 (two) times daily.  Marland Kitchen glipiZIDE (GLUCOTROL) 5 MG tablet Take 5 mg by mouth daily before breakfast.  . glucose blood (BAYER CONTOUR TEST) test strip 1 each by Other route daily. Use to check blood sugars every day Dx E11.9  . levalbuterol (XOPENEX HFA) 45 MCG/ACT inhaler Inhale 2 puffs into the lungs every 6 (six) hours as needed. Reported on 09/23/2015  . levalbuterol (XOPENEX) 0.63 MG/3ML nebulizer solution Take 0.63 mg by nebulization every 4 (four) hours as needed for wheezing or shortness of breath.  Marland Kitchen LORazepam (ATIVAN) 0.5 MG tablet Take 0.5 mg by mouth 2 (two) times daily.  . memantine (NAMENDA XR) 28 MG CP24 24 hr capsule Take 1 capsule (28 mg total) by  mouth daily.  . metFORMIN (GLUCOPHAGE XR) 500 MG 24 hr tablet Take 1 tablet (500 mg total) by mouth daily with breakfast.  . mometasone-formoterol (DULERA) 200-5 MCG/ACT AERO Inhale 1 puff into the lungs 2 (two) times daily.  Marland Kitchen MUCINEX 600 MG 12 hr tablet TAKE ONE TABLET TWICE DAILY  . polyethylene glycol (MIRALAX / GLYCOLAX) packet Take 17 g by mouth 2 (two) times daily.  . potassium chloride SA (K-DUR,KLOR-CON) 20 MEQ tablet Take 1 tablet (20 mEq total) by mouth 2 (two) times daily.  . predniSONE  (DELTASONE) 20 MG tablet Take 20 mg by mouth daily with breakfast.  . Respiratory Therapy Supplies (FLUTTER) DEVI Use as directed  . senna-docusate (SENOKOT-S) 8.6-50 MG tablet Take 1 tablet by mouth 2 (two) times daily.  Marland Kitchen SPIRIVA HANDIHALER 18 MCG inhalation capsule INHALE THE CONTENTS OF 1 CAPSULE EVERY DAY  . [DISCONTINUED] azithromycin (ZITHROMAX) 500 MG tablet Take 1 tablet (500 mg total) by mouth daily.   No facility-administered encounter medications on file as of 04/09/2017.     Allergies as of 04/09/2017 - Review Complete 04/07/2017  Allergen Reaction Noted  . Cholestatin Other (See Comments) 09/02/2010  . Sulfur Itching 01/13/2013    Past Medical History:  Diagnosis Date  . Acute blood loss anemia 04/20/2012  . AF (atrial fibrillation) ([facility])     AV ablation 9/09 [facility] per Dr Ola Spurr - AV node ablation 9/11 Dr Caryl Comes  . Amiodarone pulmonary toxicity   . Ascending aortic aneurysm ([facility]) 12/11/2016  . Asthma   . Bronchitis    hx of   . [name] (coronary artery disease)    (not sure of this 11/10  . CHF (congestive heart failure) ([place])   . Chronic renal insufficiency, stage III (moderate) (HCC)    CrCl about 60 ml/min  . CKD (chronic kidney disease) stage 3, GFR 30-59 ml/min (HCC)   . Complication of anesthesia    "psycotic episode" after hip surg - resolved  . COPD (chronic obstructive pulmonary disease) (HCC)    emphysema -FeV1 73% DLCO 53% 5/09  . CVA (cerebral vascular accident) ([facility])    no residual effects evident to pt   . Depression 06/06/2012  . Diabetes ([place])   . Diastolic heart failure    Acute on Chronic  . Eczema   . GERD (gastroesophageal reflux disease)   . History of skin cancer   . Hx of cardiovascular stress test    Lexiscan Myoview (09/2013):  No definite ischemia, EF 67%; low risk  . Hyperlipidemia   . Invasive ductal carcinoma of breast ([facility]) 2011   LEFT   . Mental disorder   . OA (osteoarthritis) of knee    RIGHT  . Pacemaker    Permanent  .  PAD (peripheral artery disease) (HCC)    w/hx right iliac/SFA stenting and left and rt leg PTA  . Pain    pt states has pain in fingertips per right hand pt states has been told may be carpal tunnel  . Pneumonia    hx of   . Right sided sciatica   . Shortness of breath dyspnea    walking distances   . Sinoatrial node dysfunction (HCC)   . Sleep apnea    associated with hypersomnia uses O2 2L/M at night and during naps also uses CPAP  . Tobacco abuse   . Tremors of nervous system    hands bilat     Past Surgical History:  Procedure Laterality Date  . ATRIAL ABLATION  SURGERY     x 2 - "did not help - had to have pacemaker"  . BREAST LUMPECTOMY     left breast  . CARDIAC CATHETERIZATION    . CATARACT EXTRACTION, BILATERAL     with IOL/Dr Katy Fitch  . EP IMPLANTABLE DEVICE N/A 02/13/2015   Procedure: PPM Generator Changeout-St. Jude device;  Surgeon: Deboraha Sprang, MD;  Location: [facility];  Service: Cardiovascular;  Laterality: N/A;  . MASTECTOMY, PARTIAL  02/03/2010   Left/Dr Rosenbower  . ORIF ACETABULAR FRACTURE Left 04/19/2012   Procedure: OPEN REDUCTION INTERNAL FIXATION (ORIF) ACETABULAR FRACTURE;  Surgeon: Rozanna Box, MD;  Location: [facility];  Service: Orthopedics;  Laterality: Left;  . sun spot removed      forhead  . TOTAL HIP ARTHROPLASTY Left 10/31/2014   Procedure: LEFT TOTAL HIP ARTHROPLASTY POSTERIOR  APPROACH;  Surgeon: Gaynelle Arabian, MD;  Location: [facility];  Service: Orthopedics;  Laterality: Left;  . TOTAL KNEE ARTHROPLASTY Right 10/16/2013   Procedure: RIGHT TOTAL KNEE ARTHROPLASTY;  Surgeon: Gearlean Alf, MD;  Location: [facility];  Service: Orthopedics;  Laterality: Right;  Marland Kitchen VASCULAR SURGERY     both legs     Family History  Problem Relation Age of Onset  . Heart disease Father     Social History   Socioeconomic History  . Marital status: Married    Spouse name: Not on file  . Number of children: Not on file  . Years of education: Not on file    . Highest education level: Not on file  Social Needs  . Financial resource strain: Not on file  . Food insecurity - worry: Not on file  . Food insecurity - inability: Not on file  . Transportation needs - medical: Not on file  . Transportation needs - non-medical: Not on file  Occupational History  . Not on file  Tobacco Use  . Smoking status: Former Smoker    Packs/day: 1.00    Years: 55.00    Pack years: 55.00    Types: Cigarettes    Last attempt to quit: 12/19/2011    Years since quitting: 5.3  . Smokeless tobacco: Never Used  . Tobacco comment: started smoking at age 71--4 cigs per day.  smoked 2ppd the last 10 yrs  Substance and Sexual Activity  . Alcohol use: Yes    Comment: wine (rare)  . Drug use: No  . Sexual activity: No  Other Topics Concern  . Not on file  Social History Narrative   HS Graduate; [employer].  Married '61.  2 sons - '70, '71; Dtrs - '64,'68;    6 grandchildren.  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 Interests -Tai-chi & water aerobics, gardening, active lifestyle.  Marriage a bit stressful - SO w/membory problems and difficult behavior. She denies any personal safety concerns. End of life care: need to address at next OV             Review of systems: Review of Systems  Constitutional: Negative for fever and chills.  HENT: Negative.   Eyes: Negative for blurred vision.  Respiratory: as per HPI  Cardiovascular: Negative for chest pain and palpitations.  Gastrointestinal: Negative for vomiting, diarrhea, blood per rectum. Genitourinary: Negative for dysuria, urgency, frequency and hematuria.  Musculoskeletal: Negative for myalgias, back pain and joint pain.  Skin: Negative  for itching and rash.  Neurological: Negative for dizziness, tremors, focal weakness, seizures and loss of  consciousness.  Endo/Heme/Allergies: Negative for environmental allergies.  Psychiatric/Behavioral: Negative for depression, suicidal ideas and hallucinations.  All other systems reviewed and are negative.  Physical Exam: Blood pressure 110/80, pulse 63, height 5' 5" (1.651 m), weight 173 lb (78.5 kg), SpO2 92 %. Gen:      No acute distress HEENT:  EOMI, sclera anicteric Neck:     No masses; no thyromegaly Lungs:    Clear to auscultation bilaterally; normal respiratory effort CV:         Regular rate and rhythm; no murmurs Abd:      + bowel sounds; soft, non-tender; no palpable masses, no distension Ext:    No edema; adequate peripheral perfusion Skin:      Warm and dry; no rash Neuro: alert and oriented x 3 Psych: normal mood and affect  Data Reviewed: Chest x-ray 04/25/16-left lower lobe airspace consolidation. Chest x-ray 05/15/16-improvement in left lower lobe consolidation Chest x-ray 06/17/16-hyperinflation, chronic interstitial changes. No acute abnormality Chest x-ray 07/27/16- hyperinflation, chronic interstitial changes. No change from before. Chest x-ray 08/21/16- hyperinflation, chronic interstitial changes. No change from before.  CT chest 11/29/16- moderate emphysema, groundglass density and consolidation in the right lower lobe.  15 mm nodular density in the left apical lung, mediastinal lymphadenopathy CT chest 03/17/17-persistence of right lobe lower lobe consolidation with worsening opacities in the right midlung endobronchial mucus. Previously noted left upper lobe nodule appears more bulky. PET scan 04/07/17-left upper lobe nodule slightly enlarged and hypermetabolic with SUV of 58.0.  Separate 0.5 cm left upper lobe nodule.  Improvement of right lower lobe consolidation.  I have reviewed all images personally.  PFTs (02/20/10) FVC 1.66 (56%), FEV1 1.289 (59%), F/F 74   Assessment:  #1 Left upper lobe nodule. PET scan reviewed which shows hypermetabolic uptake.  This is  suspicious for primary lung cancer.  She has remote history of breast cancer, invasive ductal carcinoma which was treated with partial mastectomy & no evidence of recurrence.  She is here in the clinic with her son.  We discussed extensively the results of the PET scan and next steps.  I would not recommend bronchoscopic NAV biopsy as this would mean going on general anesthesia.  We discussed getting a CT-guided biopsy however as the risk is higher for pneumothorax family will like to defer for now.  Refer for discussion at multidisciplinary oncology.  Radiation therapy with or without CT-guided biopsy will likely be a good approach.  I do not think she is a good candidate for curative resection given her poor functional status, cardiac and pulmonary issues however she wants to explore this option and discuss further at the oncology clinic.  Schedule pulmonary function test for better assessment of lung function in preparation for oncology consult.  #2 COPD GOLD D Recurrent aspiration History of recurrent respiratory tract infections.  She is noncompliant with aspiration precautions and dysphagia diet Swallow and speech therapy eval is pending Mucinex and flutter valve for clearance of secretion endobronchial mucus. Tessalon for cough Continue advair, spiriva  #3 Code status Pt reversed code status and is now full core. Seen by palliative care during last admission. Continue discussions about goals of care.   More then 1/2 the time of the 40 min visit was spent in counseling and/or coordination of care with the patient and family.  Plan/Recommendations: - PFTs, Multidisciplinary tumor board eval - Mucinex, flutter valve, tessalon, inhalers,  supplemental O2 - Aspiration precautions, swallow therapy follow up   Marshell Garfinkel MD [facility] Pager [phone] 04/09/2017, 9:17 AM  CC: Hoyt Koch, *

## 2017-04-09 NOTE — Addendum Note (Signed)
Addended by: Maryanna Shape A on: 04/09/2017 05:39 PM   Modules accepted: Orders

## 2017-04-09 NOTE — Addendum Note (Signed)
Addended byMarshell Garfinkel on: 04/09/2017 01:02 PM   Modules accepted: Level of Service

## 2017-04-09 NOTE — Patient Instructions (Addendum)
We will schedule you for pulmonary function test in the next week. We will add tessaon for cough.  Continue Mucinex and flutter valve Refer you to multidisciplinary oncology clinic for evaluation.  Follow-up and 1 month.

## 2017-04-12 ENCOUNTER — Other Ambulatory Visit: Payer: Self-pay | Admitting: Oncology

## 2017-04-12 ENCOUNTER — Telehealth: Payer: Self-pay

## 2017-04-12 DIAGNOSIS — C50212 Malignant neoplasm of upper-inner quadrant of left female breast: Secondary | ICD-10-CM

## 2017-04-12 DIAGNOSIS — G308 Other Alzheimer's disease: Secondary | ICD-10-CM | POA: Diagnosis not present

## 2017-04-12 DIAGNOSIS — Z170 Estrogen receptor positive status [ER+]: Principal | ICD-10-CM

## 2017-04-12 DIAGNOSIS — E119 Type 2 diabetes mellitus without complications: Secondary | ICD-10-CM | POA: Diagnosis not present

## 2017-04-12 DIAGNOSIS — Z79899 Other long term (current) drug therapy: Secondary | ICD-10-CM | POA: Diagnosis not present

## 2017-04-12 DIAGNOSIS — B351 Tinea unguium: Secondary | ICD-10-CM | POA: Diagnosis not present

## 2017-04-12 DIAGNOSIS — C3412 Malignant neoplasm of upper lobe, left bronchus or lung: Secondary | ICD-10-CM | POA: Insufficient documentation

## 2017-04-12 DIAGNOSIS — E669 Obesity, unspecified: Secondary | ICD-10-CM | POA: Diagnosis not present

## 2017-04-12 DIAGNOSIS — C3490 Malignant neoplasm of unspecified part of unspecified bronchus or lung: Secondary | ICD-10-CM | POA: Diagnosis not present

## 2017-04-12 DIAGNOSIS — I4891 Unspecified atrial fibrillation: Secondary | ICD-10-CM | POA: Diagnosis not present

## 2017-04-12 DIAGNOSIS — R05 Cough: Secondary | ICD-10-CM | POA: Diagnosis not present

## 2017-04-12 DIAGNOSIS — F3289 Other specified depressive episodes: Secondary | ICD-10-CM | POA: Diagnosis not present

## 2017-04-12 NOTE — Telephone Encounter (Signed)
Per PM verbally- move PFT to this week. Spoke with pt's daughter, Scheryl Marten and made her aware. Pt has been scheduled for PFT on 04/15/17. Nothing further is needed.

## 2017-04-13 ENCOUNTER — Encounter: Payer: Self-pay | Admitting: Pulmonary Disease

## 2017-04-13 ENCOUNTER — Telehealth: Payer: Self-pay

## 2017-04-13 NOTE — Telephone Encounter (Signed)
-----   Message from Marshell Garfinkel, MD sent at 04/09/2017  5:33 PM EST ----- Thanks for the message. Yes. We can start weaning it down.  Margie- Please call her nursing home and reduce prednisone to 10 mg/day for now. Will readdress at return visit.   ----- Message ----- From: Hoyt Koch, MD Sent: 04/09/2017   1:10 PM To: Marshell Garfinkel, MD  Dr. Vaughan Browner,  Do you feel this patient still requires ongoing daily prednisone? Pulmonary discharged her with this without a stop date and it has never been addressed again since. She does have concurrent diabetes so if it is not needed we should try to wean her off this medication.  Thanks for your consideration, Dr. Sharlet Salina ----- Message ----- From: Marshell Garfinkel, MD Sent: 04/09/2017  12:40 PM To: Hoyt Koch, MD

## 2017-04-13 NOTE — Telephone Encounter (Signed)
I have faxed Rx for prednisone 10mg  daily with order to [facility]. Nothing further is needed.

## 2017-04-13 NOTE — Telephone Encounter (Signed)
I have reviewed the PET scan with patient during clinic visit. OK to send her a copy of the scan.

## 2017-04-13 NOTE — Telephone Encounter (Signed)
PM, please advise on patient's PET scan results. Thanks!

## 2017-04-14 ENCOUNTER — Telehealth: Payer: Self-pay | Admitting: Oncology

## 2017-04-14 NOTE — Telephone Encounter (Signed)
Scheduled appt per 2/5 sch msg - left voicemail for patient regarding appts.

## 2017-04-15 ENCOUNTER — Ambulatory Visit: Payer: Medicare Other

## 2017-04-15 DIAGNOSIS — J449 Chronic obstructive pulmonary disease, unspecified: Secondary | ICD-10-CM

## 2017-04-15 LAB — PULMONARY FUNCTION TEST
DL/VA % PRED: 49 %
DL/VA: 2.44 ml/min/mmHg/L
DLCO UNC % PRED: 26 %
DLCO cor % pred: 29 %
DLCO cor: 7.49 ml/min/mmHg
DLCO unc: 6.9 ml/min/mmHg
FEF 25-75 PRE: 0.72 L/s
FEF 25-75 Post: 0.74 L/sec
FEF2575-%CHANGE-POST: 2 %
FEF2575-%Pred-Post: 58 %
FEF2575-%Pred-Pre: 57 %
FEV1-%CHANGE-POST: 3 %
FEV1-%Pred-Post: 68 %
FEV1-%Pred-Pre: 66 %
FEV1-POST: 1.29 L
FEV1-PRE: 1.26 L
FEV1FVC-%Change-Post: 12 %
FEV1FVC-%PRED-PRE: 94 %
FEV6-%Change-Post: -8 %
FEV6-%PRED-POST: 68 %
FEV6-%Pred-Pre: 74 %
FEV6-Post: 1.67 L
FEV6-Pre: 1.81 L
FEV6FVC-%Change-Post: 0 %
FEV6FVC-%PRED-POST: 105 %
FEV6FVC-%Pred-Pre: 105 %
FVC-%Change-Post: -8 %
FVC-%PRED-POST: 65 %
FVC-%PRED-PRE: 71 %
FVC-POST: 1.68 L
FVC-PRE: 1.83 L
PRE FEV6/FVC RATIO: 99 %
Post FEV1/FVC ratio: 77 %
Post FEV6/FVC ratio: 99 %
Pre FEV1/FVC ratio: 69 %
RV % PRED: 69 %
RV: 1.75 L
TLC % PRED: 71 %
TLC: 3.7 L

## 2017-04-16 DIAGNOSIS — F331 Major depressive disorder, recurrent, moderate: Secondary | ICD-10-CM | POA: Diagnosis not present

## 2017-04-16 DIAGNOSIS — F4321 Adjustment disorder with depressed mood: Secondary | ICD-10-CM | POA: Diagnosis not present

## 2017-04-16 DIAGNOSIS — F411 Generalized anxiety disorder: Secondary | ICD-10-CM | POA: Diagnosis not present

## 2017-04-19 ENCOUNTER — Ambulatory Visit: Payer: Medicare Other | Attending: Pulmonary Disease | Admitting: Speech Pathology

## 2017-04-19 ENCOUNTER — Other Ambulatory Visit: Payer: Self-pay

## 2017-04-19 ENCOUNTER — Encounter: Payer: Self-pay | Admitting: Speech Pathology

## 2017-04-19 DIAGNOSIS — G308 Other Alzheimer's disease: Secondary | ICD-10-CM | POA: Diagnosis not present

## 2017-04-19 DIAGNOSIS — F3289 Other specified depressive episodes: Secondary | ICD-10-CM | POA: Diagnosis not present

## 2017-04-19 DIAGNOSIS — E119 Type 2 diabetes mellitus without complications: Secondary | ICD-10-CM | POA: Diagnosis not present

## 2017-04-19 DIAGNOSIS — E669 Obesity, unspecified: Secondary | ICD-10-CM | POA: Diagnosis not present

## 2017-04-19 DIAGNOSIS — R1312 Dysphagia, oropharyngeal phase: Secondary | ICD-10-CM | POA: Insufficient documentation

## 2017-04-19 DIAGNOSIS — C3490 Malignant neoplasm of unspecified part of unspecified bronchus or lung: Secondary | ICD-10-CM | POA: Diagnosis not present

## 2017-04-19 DIAGNOSIS — I4891 Unspecified atrial fibrillation: Secondary | ICD-10-CM | POA: Diagnosis not present

## 2017-04-19 DIAGNOSIS — J449 Chronic obstructive pulmonary disease, unspecified: Secondary | ICD-10-CM | POA: Diagnosis not present

## 2017-04-19 NOTE — Patient Instructions (Addendum)
  You should consume foods that are soft and finely chopped (dysphagia 2 diet) with liquids that are a nectar-thick consistency. Take small sips, make sure to chew your food well. Do not talk when eating, do not eat or drink if you are short of breath.   I recommend having another swallow study (Modified Barium Swallow) as an outpatient to determine if it is safe for you to consume regular liquids. Return for a follow-up visit in 4 weeks or after you have had the swallow test.   Signs of Aspiration Pneumonia   . Chest pain/tightness . Fever (can be low grade) . Cough  o With foul-smelling phlegm (sputum) o With sputum containing pus or blood o With greenish sputum . Fatigue  . Shortness of breath  . Wheezing   **IF YOU HAVE THESE SIGNS, CONTACT YOUR DOCTOR OR GO TO THE EMERGENCY DEPARTMENT OR URGENT CARE AS SOON AS POSSIBLE**

## 2017-04-19 NOTE — Therapy (Signed)
[facility] [address] [place], [place], [address] Phone: [phone]   Fax:  [phone]  Speech Language Pathology Evaluation  Patient Details  Name: [name] MRN: [id number] Date of Birth: 1931/03/17 Referring Provider: Marshell Garfinkel, MD   Encounter Date: 04/19/2017  End of Session - 04/19/17 1347    Visit Number  1    Number of Visits  2    Date for SLP Re-Evaluation  05/28/17    SLP Start Time  0940    SLP Stop Time   1015    SLP Time Calculation (min)  35 min    Activity Tolerance  Patient tolerated treatment well       Past Medical History:  Diagnosis Date  . Acute blood loss anemia 04/20/2012  . AF (atrial fibrillation) ([place])     AV ablation 9/09 [place] per Dr Ola Spurr - AV node ablation 9/11 Dr Caryl Comes  . Amiodarone pulmonary toxicity   . Ascending aortic aneurysm ([place]) 12/11/2016  . Asthma   . Bronchitis    hx of   . [name] (coronary artery disease)    (not sure of this 11/10  . CHF (congestive heart failure) ([place])   . Chronic renal insufficiency, stage III (moderate) (HCC)    CrCl about 60 ml/min  . CKD (chronic kidney disease) stage 3, GFR 30-59 ml/min (HCC)   . Complication of anesthesia    "psycotic episode" after hip surg - resolved  . COPD (chronic obstructive pulmonary disease) (HCC)    emphysema -FeV1 73% DLCO 53% 5/09  . CVA (cerebral vascular accident) ([place])    no residual effects evident to pt   . Depression 06/06/2012  . Diabetes ([place])   . Diastolic heart failure    Acute on Chronic  . Eczema   . GERD (gastroesophageal reflux disease)   . History of skin cancer   . Hx of cardiovascular stress test    Lexiscan Myoview (09/2013):  No definite ischemia, EF 67%; low risk  . Hyperlipidemia   . Invasive ductal carcinoma of breast ([place]) 2011   LEFT   . Mental disorder   . OA (osteoarthritis) of knee    RIGHT  . Pacemaker    Permanent  . PAD (peripheral artery disease) (HCC)     w/hx right iliac/SFA stenting and left and rt leg PTA  . Pain    pt states has pain in fingertips per right hand pt states has been told may be carpal tunnel  . Pneumonia    hx of   . Right sided sciatica   . Shortness of breath dyspnea    walking distances   . Sinoatrial node dysfunction (HCC)   . Sleep apnea    associated with hypersomnia uses O2 2L/M at night and during naps also uses CPAP  . Tobacco abuse   . Tremors of nervous system    hands bilat     Past Surgical History:  Procedure Laterality Date  . ATRIAL ABLATION SURGERY     x 2 - "did not help - had to have pacemaker"  . BREAST LUMPECTOMY     left breast  . CARDIAC CATHETERIZATION    . CATARACT EXTRACTION, BILATERAL     with IOL/Dr Katy Fitch  . EP IMPLANTABLE DEVICE N/A 02/13/2015   Procedure: PPM Generator Changeout-St. Jude device;  Surgeon: Deboraha Sprang, MD;  Location: [facility];  Service: Cardiovascular;  Laterality: N/A;  . MASTECTOMY, PARTIAL  02/03/2010  Left/Dr Rosenbower  . ORIF ACETABULAR FRACTURE Left 04/19/2012   Procedure: OPEN REDUCTION INTERNAL FIXATION (ORIF) ACETABULAR FRACTURE;  Surgeon: Rozanna Box, MD;  Location: [facility];  Service: Orthopedics;  Laterality: Left;  . sun spot removed      forhead  . TOTAL HIP ARTHROPLASTY Left 10/31/2014   Procedure: LEFT TOTAL HIP ARTHROPLASTY POSTERIOR  APPROACH;  Surgeon: Gaynelle Arabian, MD;  Location: [facility];  Service: Orthopedics;  Laterality: Left;  . TOTAL KNEE ARTHROPLASTY Right 10/16/2013   Procedure: RIGHT TOTAL KNEE ARTHROPLASTY;  Surgeon: Gearlean Alf, MD;  Location: [facility];  Service: Orthopedics;  Laterality: Right;  Marland Kitchen VASCULAR SURGERY     both legs     There were no vitals filed for this visit.  Subjective Assessment - 04/19/17 0940    Subjective  "They thought my problem was because of swallowing."    Currently in Pain?  Yes    Pain Location  Arm    Pain Orientation  Right;Left    Pain Descriptors / Indicators  Sharp    Pain  Type  Acute pain    Pain Onset  More than a month ago    Pain Frequency  Constant    Aggravating Factors   exercises         SLP Evaluation [facility] - 04/19/17 0940      SLP Visit Information   SLP Received On  04/19/17    Referring Provider  Marshell Garfinkel, MD    Onset Date  03/24/17    Medical Diagnosis  T17.908A (ICD-10-CM) - Aspiration into airway, initial encounter      Subjective   Subjective  Pt reports she eats a regular diet with thin liquids    Patient/Family Stated Goal  not to drink thickened liquids      General Information   HPI   [name] is a 84 y.o. female with medical history significant of chronic respiratory failure on 2 L oxygen, COPD, asthma, hyperlipidemia, stroke, GERD, former smoker, [name], s/p of placement, [name], [name], [name], [name], [name], recurrent respiratory tract infections. She has had multiple hospitalizations for CAP and suspected aspiration PNA. Patient is known to [facility] service with previous MBS dated 04/27/2016 which did show penetration/aspiration given large or serial sips of thin liquids due to suspected respiratory-based dysphagia. Most recent MBS 11/29/16 revealed similar findings, and regular diet with thin liquids was recommended. Subsequently pt had decline in respiratory status and overt signs of aspiration of thin liquids clinically. She was recommended to consume dys 2, nectar thick liquids pending repeat MBS. Pt was discharged before repeat MBS could be completed. Per MD notes and pt report she has not been compliant with diet recommendations. Referred by Dr. Vaughan Browner after she presented for recurrent cough sin Jan, recurrent CAP with possible aspiration. Chest CT 03/17/17 findings: previously noted right lower lobe pneumonia has incompletely resolved, and there is now evidence of endobronchial spread of infection throughout other portions of the right lung, particularly the right middle lobe. PET scan also  revealed left pulmonary nodule and pt has been referred to oncology for suspected lung cancer.    Behavioral/Cognition  alert, cooperative    Mobility Status  uses a walker      Prior Functional Status   Cognitive/Linguistic Baseline  Information not available    Type of Home  Assisted living    Available Support  Available 24 hours/day    Education  college  Vocation  Retired      Medical laboratory scientific officer Function   Overall Oral Motor/Sensory Function  Appears within functional limits for tasks assessed      Standardized Assessments   Standardized Assessments   Other Assessment    Other Assessment  Clinical swallowing evaluation administered. Oral motor examination is unremarkable. With single and multiple sips of thin liquids, pt presents with increased work of breathing, throat clearing. There is also wet coughing after single sip of thin liquid wash of puree, concerning for decreased airway protection. During mastication of regular solid, pt presents with intermittent coughing; pt seems unaware of coughing/throat clearing episodes. With nectar thick liquids, there are no overt signs of aspiration with single sips. Administered reflux symptom index; pt scored 13/45 (<10-13 is normal). She does have a history of GERD and reports occasional difficulties swallowing due to feeling "filled up."                      SLP Education - 04/19/17 1347    Education provided  Yes    Education Details  diet recommendation/swallow precautions, signs of aspiration PNA    Person(s) Educated  Patient;Caregiver(s)    Methods  Explanation;Handout    Comprehension  Need further instruction         SLP Long Term Goals - 04/19/17 1338      SLP LONG TERM GOAL #1   Title  Pt will participate in MBS to determine safest, least restrictive diet and identify compensatory strategies to improve swallow function.    Time  6    Period  Weeks    Status  New      SLP LONG TERM GOAL #2   Title  Pt  will demo swallow precautions with modified independence.    Time  6    Period  Weeks    Status  New       Plan - 04/19/17 1336    Clinical Impression Statement  Patient presents with suspected respiratory-based dysphagia and overt signs of aspiration with thin liquids, regular solids. While there is clinical improvement with nectar-thick liquids, I recommend repeat MBS to determine least restrictive diet, identify compensatory techniques to improve swallow function, and screen for potential esophageal component to pt's dysphagia. Long-term use of thickened liquids may be contraindicated with pt's history of GERD and reports of difficulties swallowing because she is "filled up." Pt wears ill-fitting dentures; I do suspect that impaired mastication and respiratory function may be leading to aspiration events with solids. I have recommended she follow dys 2 (soft, fine chopped) diet with nectar thick liquids until MBS can be repeated. Pt would benefit from skilled ST to address dysphagia, for training and education in aspiration precautions and compensations to improve swallow function. MBS ordered this visit and instructions given to caregiver to schedule outpatient MBS. Pt to return in one month or after MBS completed for f/u.     Speech Therapy Frequency  Other (comment) f/u x1 after MBS; additional visits to be determined pending outcome of objective testing    Duration  4 weeks or after MBS completed    Treatment/Interventions  Other (comment);Compensatory strategies;Trials of upgraded texture/liquids;Diet toleration management by SLP;Aspiration precaution training;Pharyngeal strengthening exercises;Patient/family education;SLP instruction and feedback MBS    Potential to Achieve Goals  Fair    Potential Considerations  Co-morbidities;Cooperation/participation level    Consulted and Agree with Plan of Care  Patient;Other (Comment) caregiver from assisted living       Patient  will benefit from  skilled therapeutic intervention in order to improve the following deficits and impairments:   Dysphagia, oropharyngeal phase    Problem List Patient Active Problem List   Diagnosis Date Noted  . Primary cancer of left upper lobe of lung ([place]) 04/12/2017  . Recurrent pneumonia 03/05/2017  . Pulmonary nodule 12/11/2016  . Ascending aortic aneurysm ([place]) 12/11/2016  . Agitation   . Acute on chronic respiratory failure with hypoxia ([place]) 11/28/2016  . HCAP (healthcare-associated pneumonia) 11/28/2016  . Acute renal failure superimposed on stage 3 chronic kidney disease ([place]) 11/28/2016  . Gait instability 06/22/2016  . Chronic respiratory failure ([place]) 05/15/2016  . Rotator cuff tear arthropathy of both shoulders 12/09/2015  . Complete heart block ([place]) 02/13/2015  . Acute blood loss anemia 01/28/2015  . Constipation 11/07/2014  . OA (osteoarthritis) of hip 10/31/2014  . Osteoporosis 01/10/2014  . Nicotine dependence in remission 06/28/2013  . GERD (gastroesophageal reflux disease) 07/19/2012  . Depression 06/06/2012  . Secondary hyperparathyroidism ([place]) 04/25/2012  . Gold stage C. COPD with frequent exacerbations 12/15/2011  . Diastolic CHF, chronic ([place]) 12/15/2011  . Goals of care, counseling/discussion 08/27/2011  . History of stroke 02/21/2010  . PACEMAKER, PERMANENT 02/21/2010  . Malignant neoplasm of upper-inner quadrant of left breast in female, estrogen receptor positive ([place]) 02/03/2010  . Allergic rhinitis 12/31/2009  . Carotid stenosis 08/17/2008  . Controlled diabetes mellitus type 2 with complications ([place]) 42/68/3419  . Obstructive sleep apnea 06/01/2008  . Atrial fibrillation ([place]) 06/01/2008  . [name] 06/01/2008  . Hyperlipidemia 12/30/2006  . [name] (coronary artery disease) 12/30/2006   Deneise Lever, [place], [place] 04/19/2017, 1:49 PM  [facility] [address] [place], [place], [address] Phone: [phone]   Fax:  [phone]  Name: [name] MRN: [id number] Date of Birth: Feb 13, 1932

## 2017-04-22 DIAGNOSIS — F331 Major depressive disorder, recurrent, moderate: Secondary | ICD-10-CM | POA: Diagnosis not present

## 2017-04-22 DIAGNOSIS — F411 Generalized anxiety disorder: Secondary | ICD-10-CM | POA: Diagnosis not present

## 2017-04-22 DIAGNOSIS — F4321 Adjustment disorder with depressed mood: Secondary | ICD-10-CM | POA: Diagnosis not present

## 2017-04-23 ENCOUNTER — Inpatient Hospital Stay (HOSPITAL_COMMUNITY)
Admission: EM | Admit: 2017-04-23 | Discharge: 2017-04-29 | DRG: 193 | Disposition: A | Discharge status: Hospice | Payer: Medicare Other | Attending: Internal Medicine | Admitting: Internal Medicine

## 2017-04-23 ENCOUNTER — Other Ambulatory Visit (HOSPITAL_COMMUNITY): Payer: Self-pay | Admitting: Pulmonary Disease

## 2017-04-23 ENCOUNTER — Encounter (HOSPITAL_COMMUNITY): Payer: Self-pay | Admitting: Emergency Medicine

## 2017-04-23 ENCOUNTER — Emergency Department (HOSPITAL_COMMUNITY): Payer: Medicare Other

## 2017-04-23 DIAGNOSIS — R1310 Dysphagia, unspecified: Secondary | ICD-10-CM

## 2017-04-23 DIAGNOSIS — R0902 Hypoxemia: Secondary | ICD-10-CM

## 2017-04-23 DIAGNOSIS — Z7984 Long term (current) use of oral hypoglycemic drugs: Secondary | ICD-10-CM

## 2017-04-23 DIAGNOSIS — I5032 Chronic diastolic (congestive) heart failure: Secondary | ICD-10-CM | POA: Diagnosis not present

## 2017-04-23 DIAGNOSIS — Z7951 Long term (current) use of inhaled steroids: Secondary | ICD-10-CM

## 2017-04-23 DIAGNOSIS — J690 Pneumonitis due to inhalation of food and vomit: Secondary | ICD-10-CM | POA: Diagnosis not present

## 2017-04-23 DIAGNOSIS — J9621 Acute and chronic respiratory failure with hypoxia: Secondary | ICD-10-CM | POA: Diagnosis not present

## 2017-04-23 DIAGNOSIS — Z96651 Presence of right artificial knee joint: Secondary | ICD-10-CM | POA: Diagnosis present

## 2017-04-23 DIAGNOSIS — J4489 Other specified chronic obstructive pulmonary disease: Secondary | ICD-10-CM | POA: Diagnosis present

## 2017-04-23 DIAGNOSIS — N183 Chronic kidney disease, stage 3 unspecified: Secondary | ICD-10-CM | POA: Diagnosis present

## 2017-04-23 DIAGNOSIS — R69 Illness, unspecified: Secondary | ICD-10-CM

## 2017-04-23 DIAGNOSIS — R509 Fever, unspecified: Secondary | ICD-10-CM | POA: Diagnosis present

## 2017-04-23 DIAGNOSIS — R059 Cough, unspecified: Secondary | ICD-10-CM | POA: Diagnosis present

## 2017-04-23 DIAGNOSIS — Z8673 Personal history of transient ischemic attack (TIA), and cerebral infarction without residual deficits: Secondary | ICD-10-CM

## 2017-04-23 DIAGNOSIS — I255 Ischemic cardiomyopathy: Secondary | ICD-10-CM | POA: Diagnosis present

## 2017-04-23 DIAGNOSIS — Z9119 Patient's noncompliance with other medical treatment and regimen: Secondary | ICD-10-CM

## 2017-04-23 DIAGNOSIS — F419 Anxiety disorder, unspecified: Secondary | ICD-10-CM | POA: Diagnosis present

## 2017-04-23 DIAGNOSIS — Z8249 Family history of ischemic heart disease and other diseases of the circulatory system: Secondary | ICD-10-CM

## 2017-04-23 DIAGNOSIS — Z7901 Long term (current) use of anticoagulants: Secondary | ICD-10-CM

## 2017-04-23 DIAGNOSIS — Z950 Presence of cardiac pacemaker: Secondary | ICD-10-CM | POA: Diagnosis present

## 2017-04-23 DIAGNOSIS — E785 Hyperlipidemia, unspecified: Secondary | ICD-10-CM | POA: Diagnosis present

## 2017-04-23 DIAGNOSIS — Z96642 Presence of left artificial hip joint: Secondary | ICD-10-CM | POA: Diagnosis present

## 2017-04-23 DIAGNOSIS — Z515 Encounter for palliative care: Secondary | ICD-10-CM

## 2017-04-23 DIAGNOSIS — J101 Influenza due to other identified influenza virus with other respiratory manifestations: Secondary | ICD-10-CM | POA: Diagnosis not present

## 2017-04-23 DIAGNOSIS — I2510 Atherosclerotic heart disease of native coronary artery without angina pectoris: Secondary | ICD-10-CM | POA: Diagnosis present

## 2017-04-23 DIAGNOSIS — R05 Cough: Secondary | ICD-10-CM | POA: Diagnosis not present

## 2017-04-23 DIAGNOSIS — Z66 Do not resuscitate: Secondary | ICD-10-CM | POA: Diagnosis present

## 2017-04-23 DIAGNOSIS — F0390 Unspecified dementia without behavioral disturbance: Secondary | ICD-10-CM | POA: Diagnosis present

## 2017-04-23 DIAGNOSIS — K219 Gastro-esophageal reflux disease without esophagitis: Secondary | ICD-10-CM | POA: Diagnosis present

## 2017-04-23 DIAGNOSIS — Z853 Personal history of malignant neoplasm of breast: Secondary | ICD-10-CM

## 2017-04-23 DIAGNOSIS — G4733 Obstructive sleep apnea (adult) (pediatric): Secondary | ICD-10-CM | POA: Diagnosis present

## 2017-04-23 DIAGNOSIS — Z85828 Personal history of other malignant neoplasm of skin: Secondary | ICD-10-CM

## 2017-04-23 DIAGNOSIS — Z7952 Long term (current) use of systemic steroids: Secondary | ICD-10-CM

## 2017-04-23 DIAGNOSIS — C3412 Malignant neoplasm of upper lobe, left bronchus or lung: Secondary | ICD-10-CM | POA: Diagnosis present

## 2017-04-23 DIAGNOSIS — E118 Type 2 diabetes mellitus with unspecified complications: Secondary | ICD-10-CM | POA: Diagnosis present

## 2017-04-23 DIAGNOSIS — I714 Abdominal aortic aneurysm, without rupture: Secondary | ICD-10-CM | POA: Diagnosis present

## 2017-04-23 DIAGNOSIS — J111 Influenza due to unidentified influenza virus with other respiratory manifestations: Secondary | ICD-10-CM | POA: Insufficient documentation

## 2017-04-23 DIAGNOSIS — Z87891 Personal history of nicotine dependence: Secondary | ICD-10-CM

## 2017-04-23 DIAGNOSIS — J441 Chronic obstructive pulmonary disease with (acute) exacerbation: Secondary | ICD-10-CM | POA: Diagnosis not present

## 2017-04-23 DIAGNOSIS — J449 Chronic obstructive pulmonary disease, unspecified: Secondary | ICD-10-CM

## 2017-04-23 DIAGNOSIS — L8990 Pressure ulcer of unspecified site, unspecified stage: Secondary | ICD-10-CM

## 2017-04-23 DIAGNOSIS — E1122 Type 2 diabetes mellitus with diabetic chronic kidney disease: Secondary | ICD-10-CM | POA: Diagnosis not present

## 2017-04-23 DIAGNOSIS — E1151 Type 2 diabetes mellitus with diabetic peripheral angiopathy without gangrene: Secondary | ICD-10-CM | POA: Diagnosis present

## 2017-04-23 DIAGNOSIS — F329 Major depressive disorder, single episode, unspecified: Secondary | ICD-10-CM | POA: Diagnosis present

## 2017-04-23 DIAGNOSIS — Z8701 Personal history of pneumonia (recurrent): Secondary | ICD-10-CM

## 2017-04-23 DIAGNOSIS — I4891 Unspecified atrial fibrillation: Secondary | ICD-10-CM | POA: Diagnosis present

## 2017-04-23 DIAGNOSIS — Z9981 Dependence on supplemental oxygen: Secondary | ICD-10-CM

## 2017-04-23 LAB — HEMOGLOBIN A1C
Hgb A1c MFr Bld: 7.7 % — ABNORMAL HIGH (ref 4.8–5.6)
MEAN PLASMA GLUCOSE: 174.29 mg/dL

## 2017-04-23 LAB — CBC WITH DIFFERENTIAL/PLATELET
BASOS ABS: 0.1 10*3/uL (ref 0.0–0.1)
BASOS PCT: 1 %
EOS PCT: 0 %
Eosinophils Absolute: 0 10*3/uL (ref 0.0–0.7)
HCT: 41.3 % (ref 36.0–46.0)
Hemoglobin: 13.2 g/dL (ref 12.0–15.0)
Lymphocytes Relative: 15 %
Lymphs Abs: 1.4 10*3/uL (ref 0.7–4.0)
MCH: 29.7 pg (ref 26.0–34.0)
MCHC: 32 g/dL (ref 30.0–36.0)
MCV: 92.8 fL (ref 78.0–100.0)
MONO ABS: 1.2 10*3/uL — AB (ref 0.1–1.0)
Monocytes Relative: 13 %
Neutro Abs: 6.8 10*3/uL (ref 1.7–7.7)
Neutrophils Relative %: 71 %
PLATELETS: 181 10*3/uL (ref 150–400)
RBC: 4.45 MIL/uL (ref 3.87–5.11)
RDW: 17.2 % — AB (ref 11.5–15.5)
WBC: 9.5 10*3/uL (ref 4.0–10.5)

## 2017-04-23 LAB — COMPREHENSIVE METABOLIC PANEL
ALBUMIN: 2.7 g/dL — AB (ref 3.5–5.0)
ALK PHOS: 54 U/L (ref 38–126)
ALT: 14 U/L (ref 14–54)
AST: 20 U/L (ref 15–41)
Anion gap: 11 (ref 5–15)
BILIRUBIN TOTAL: 0.8 mg/dL (ref 0.3–1.2)
BUN: 12 mg/dL (ref 6–20)
CALCIUM: 8.5 mg/dL — AB (ref 8.9–10.3)
CO2: 25 mmol/L (ref 22–32)
CREATININE: 1.21 mg/dL — AB (ref 0.44–1.00)
Chloride: 105 mmol/L (ref 101–111)
GFR calc Af Amer: 47 mL/min — ABNORMAL LOW (ref >60)
GFR calc non Af Amer: 40 mL/min — ABNORMAL LOW (ref >60)
GLUCOSE: 106 mg/dL — AB (ref 65–99)
Potassium: 4.1 mmol/L (ref 3.5–5.1)
Sodium: 141 mmol/L (ref 135–145)
TOTAL PROTEIN: 6.3 g/dL — AB (ref 6.5–8.1)

## 2017-04-23 LAB — GLUCOSE, CAPILLARY
Glucose-Capillary: 97 mg/dL (ref 65–99)
Glucose-Capillary: 212 mg/dL — ABNORMAL HIGH (ref 65–99)

## 2017-04-23 LAB — I-STAT ARTERIAL BLOOD GAS, ED
BICARBONATE: 24.7 mmol/L (ref 20.0–28.0)
O2 SAT: 90 %
PH ART: 7.37 (ref 7.350–7.450)
TCO2: 26 mmol/L (ref 22–32)
pCO2 arterial: 43.5 mmHg (ref 32.0–48.0)
pO2, Arterial: 67 mmHg — ABNORMAL LOW (ref 83.0–108.0)

## 2017-04-23 LAB — LIPASE, BLOOD: LIPASE: 26 U/L (ref 11–51)

## 2017-04-23 LAB — INFLUENZA PANEL BY PCR (TYPE A & B)
INFLBPCR: NEGATIVE
Influenza A By PCR: POSITIVE — AB

## 2017-04-23 LAB — BRAIN NATRIURETIC PEPTIDE: B Natriuretic Peptide: 275 pg/mL — ABNORMAL HIGH (ref 0.0–100.0)

## 2017-04-23 LAB — I-STAT CG4 LACTIC ACID, ED
Lactic Acid, Venous: 1.17 mmol/L (ref 0.5–1.9)
Lactic Acid, Venous: 1.06 mmol/L (ref 0.5–1.9)

## 2017-04-23 MED ORDER — OSELTAMIVIR PHOSPHATE 75 MG PO CAPS
75.0000 mg | ORAL_CAPSULE | Freq: Two times a day (BID) | ORAL | Status: DC
  Filled 2017-04-23: qty 1

## 2017-04-23 MED ORDER — IPRATROPIUM-ALBUTEROL 0.5-2.5 (3) MG/3ML IN SOLN
3.0000 mL | Freq: Four times a day (QID) | RESPIRATORY_TRACT | Status: DC
  Administered 2017-04-24 (×3): 3 mL via RESPIRATORY_TRACT
  Filled 2017-04-23 (×3): qty 3

## 2017-04-23 MED ORDER — INSULIN ASPART 100 UNIT/ML ~~LOC~~ SOLN
0.0000 [IU] | Freq: Every day | SUBCUTANEOUS | Status: DC
  Administered 2017-04-23 – 2017-04-24 (×2): 2 [IU] via SUBCUTANEOUS
  Administered 2017-04-25: 4 [IU] via SUBCUTANEOUS
  Administered 2017-04-26 – 2017-04-28 (×3): 2 [IU] via SUBCUTANEOUS

## 2017-04-23 MED ORDER — ACETAMINOPHEN 650 MG RE SUPP: 650.0000 mg | Freq: Four times a day (QID) | RECTAL | Status: DC | PRN

## 2017-04-23 MED ORDER — IPRATROPIUM BROMIDE 0.02 % IN SOLN
0.5000 mg | RESPIRATORY_TRACT | Status: DC
  Administered 2017-04-23: 0.5 mg via RESPIRATORY_TRACT
  Filled 2017-04-23: qty 2.5

## 2017-04-23 MED ORDER — SODIUM CHLORIDE 0.9 % IV SOLN
1000.0000 mL | INTRAVENOUS | Status: DC
  Administered 2017-04-23: 1000 mL via INTRAVENOUS

## 2017-04-23 MED ORDER — APIXABAN 5 MG PO TABS
5.0000 mg | ORAL_TABLET | Freq: Two times a day (BID) | ORAL | Status: DC
  Administered 2017-04-23 – 2017-04-29 (×13): 5 mg via ORAL
  Filled 2017-04-23 (×14): qty 1

## 2017-04-23 MED ORDER — ESCITALOPRAM OXALATE 10 MG PO TABS
10.0000 mg | ORAL_TABLET | Freq: Every day | ORAL | Status: DC
  Administered 2017-04-23 – 2017-04-29 (×7): 10 mg via ORAL
  Filled 2017-04-23 (×8): qty 1

## 2017-04-23 MED ORDER — ACETAMINOPHEN 325 MG PO TABS: 650.0000 mg | ORAL_TABLET | Freq: Four times a day (QID) | ORAL | Status: DC | PRN

## 2017-04-23 MED ORDER — SODIUM CHLORIDE 0.9 % IV SOLN
1000.0000 mL | INTRAVENOUS | Status: DC
  Administered 2017-04-23 – 2017-04-24 (×2): 1000 mL via INTRAVENOUS

## 2017-04-23 MED ORDER — LORAZEPAM 0.5 MG PO TABS
0.5000 mg | ORAL_TABLET | Freq: Two times a day (BID) | ORAL | Status: DC
  Administered 2017-04-23 – 2017-04-29 (×13): 0.5 mg via ORAL
  Filled 2017-04-23 (×14): qty 1

## 2017-04-23 MED ORDER — ONDANSETRON HCL 4 MG PO TABS: 4.0000 mg | ORAL_TABLET | Freq: Four times a day (QID) | ORAL | Status: DC | PRN

## 2017-04-23 MED ORDER — ACETAMINOPHEN 500 MG PO TABS
1000.0000 mg | ORAL_TABLET | Freq: Once | ORAL | Status: AC
  Administered 2017-04-23: 1000 mg via ORAL
  Filled 2017-04-23: qty 2

## 2017-04-23 MED ORDER — SENNOSIDES-DOCUSATE SODIUM 8.6-50 MG PO TABS
1.0000 | ORAL_TABLET | Freq: Two times a day (BID) | ORAL | Status: DC
  Administered 2017-04-23 – 2017-04-29 (×13): 1 via ORAL
  Filled 2017-04-23 (×13): qty 1

## 2017-04-23 MED ORDER — INSULIN ASPART 100 UNIT/ML ~~LOC~~ SOLN
0.0000 [IU] | Freq: Three times a day (TID) | SUBCUTANEOUS | Status: DC
  Administered 2017-04-24: 11 [IU] via SUBCUTANEOUS
  Administered 2017-04-24 (×2): 2 [IU] via SUBCUTANEOUS
  Administered 2017-04-25 (×2): 5 [IU] via SUBCUTANEOUS
  Administered 2017-04-26: 15 [IU] via SUBCUTANEOUS
  Administered 2017-04-26: 5 [IU] via SUBCUTANEOUS
  Administered 2017-04-27 (×2): 3 [IU] via SUBCUTANEOUS
  Administered 2017-04-27: 5 [IU] via SUBCUTANEOUS
  Administered 2017-04-28: 8 [IU] via SUBCUTANEOUS
  Administered 2017-04-28 – 2017-04-29 (×2): 3 [IU] via SUBCUTANEOUS

## 2017-04-23 MED ORDER — LEVALBUTEROL TARTRATE 45 MCG/ACT IN AERO: 2.0000 | INHALATION_SPRAY | Freq: Four times a day (QID) | RESPIRATORY_TRACT | Status: DC | PRN

## 2017-04-23 MED ORDER — HYDROCOD POLST-CPM POLST ER 10-8 MG/5ML PO SUER: 5.0000 mL | Freq: Two times a day (BID) | ORAL | Status: DC

## 2017-04-23 MED ORDER — ONDANSETRON HCL 4 MG/2ML IJ SOLN: 4.0000 mg | Freq: Four times a day (QID) | INTRAMUSCULAR | Status: DC | PRN

## 2017-04-23 MED ORDER — OSELTAMIVIR PHOSPHATE 30 MG PO CAPS
30.0000 mg | ORAL_CAPSULE | Freq: Two times a day (BID) | ORAL | Status: AC
  Administered 2017-04-24 – 2017-04-28 (×10): 30 mg via ORAL
  Filled 2017-04-23 (×11): qty 1

## 2017-04-23 MED ORDER — OSELTAMIVIR PHOSPHATE 75 MG PO CAPS
75.0000 mg | ORAL_CAPSULE | Freq: Once | ORAL | Status: AC
  Administered 2017-04-23: 75 mg via ORAL
  Filled 2017-04-23: qty 1

## 2017-04-23 MED ORDER — ALBUTEROL SULFATE (2.5 MG/3ML) 0.083% IN NEBU
2.5000 mg | INHALATION_SOLUTION | RESPIRATORY_TRACT | Status: DC
  Administered 2017-04-23: 2.5 mg via RESPIRATORY_TRACT
  Filled 2017-04-23: qty 3

## 2017-04-23 MED ORDER — ALBUTEROL SULFATE (2.5 MG/3ML) 0.083% IN NEBU
2.5000 mg | INHALATION_SOLUTION | RESPIRATORY_TRACT | Status: DC | PRN
  Administered 2017-04-24 – 2017-04-26 (×3): 2.5 mg via RESPIRATORY_TRACT
  Filled 2017-04-23 (×3): qty 3

## 2017-04-23 MED ORDER — IPRATROPIUM-ALBUTEROL 0.5-2.5 (3) MG/3ML IN SOLN
3.0000 mL | RESPIRATORY_TRACT | Status: DC
  Administered 2017-04-23: 3 mL via RESPIRATORY_TRACT
  Filled 2017-04-23: qty 3

## 2017-04-23 MED ORDER — PREDNISONE 20 MG PO TABS: 50.0000 mg | ORAL_TABLET | Freq: Every day | ORAL | Status: DC

## 2017-04-23 MED ORDER — PREDNISONE 50 MG PO TABS
50.0000 mg | ORAL_TABLET | Freq: Every day | ORAL | Status: AC
  Administered 2017-04-23 – 2017-04-26 (×4): 50 mg via ORAL
  Filled 2017-04-23 (×4): qty 1

## 2017-04-23 MED ORDER — KETOROLAC TROMETHAMINE 15 MG/ML IJ SOLN: 15.0000 mg | Freq: Four times a day (QID) | INTRAMUSCULAR | Status: DC | PRN

## 2017-04-23 MED ORDER — IPRATROPIUM-ALBUTEROL 0.5-2.5 (3) MG/3ML IN SOLN
3.0000 mL | Freq: Once | RESPIRATORY_TRACT | Status: AC
  Administered 2017-04-23: 3 mL via RESPIRATORY_TRACT
  Filled 2017-04-23: qty 3

## 2017-04-23 NOTE — H&P (Signed)
History and Physical    [name] ZSM:[id number] DOB: 1931-07-22 DOA: 04/23/2017  PCP: Tiffany Koch, MD Patient coming from: facility  Chief Complaint: fever/cough  HPI: [name] is a 84 y.o. female with medical history significant for COPD with frequent exacerbations. A. fib on eliquis, permit pacemaker, right lower lobe pneumonia, diabetes, is sent to the emergency department by the facility where she resides she was found to have an oxygen saturation level 83% on room air. Of note patient's mostly wearing oxygen at night which she frequently pulls off. Addition she has a temperature of 101.6 rectally.  Information is obtained from the patient noting that it may be unreliable do to acute illness and the chart. Patient reports feeling more short of breath over the last couple of days with increased coughing and increased sputum production. He states she's post were oxygen night and she thinks she pulls it off sometimes. She said yesterday she "did not feel well". States she ambulates at the facility with a walkers had no recent falls. She denies headache dizziness syncope or near-syncope. She denies chest pain palpitation diaphoresis abdominal pain nausea vomiting. She denies dysuria hematuria frequency or urgency. She denies worsening lower extremity edema or orthopnea.    ED Course: The emergency department she's hemodynamically stable with a rectal temperature 101.9. He is provided with nebulizers and Tamiflu she has a positive influenza A test  Review of Systems: As per HPI otherwise all other systems reviewed and are negative.   Ambulatory Status: Patient reports ambulating unassisted with a steady gait  Past Medical History:  Diagnosis Date  . Acute blood loss anemia 04/20/2012  . AF (atrial fibrillation) ([place])     AV ablation 9/09 [place] per Dr Ola Spurr - AV node ablation 9/11 Dr Caryl Comes  . Amiodarone pulmonary toxicity   . Ascending aortic aneurysm ([place])  12/11/2016  . Asthma   . Bronchitis    hx of   . [name] (coronary artery disease)    (not sure of this 11/10  . CHF (congestive heart failure) ([place])   . Chronic renal insufficiency, stage III (moderate) (HCC)    CrCl about 60 ml/min  . CKD (chronic kidney disease) stage 3, GFR 30-59 ml/min (HCC)   . Complication of anesthesia    "psycotic episode" after hip surg - resolved  . COPD (chronic obstructive pulmonary disease) (HCC)    emphysema -FeV1 73% DLCO 53% 5/09  . CVA (cerebral vascular accident) ([place])    no residual effects evident to pt   . Depression 06/06/2012  . Diabetes ([place])   . Diastolic heart failure    Acute on Chronic  . Eczema   . GERD (gastroesophageal reflux disease)   . History of skin cancer   . Hx of cardiovascular stress test    Lexiscan Myoview (09/2013):  No definite ischemia, EF 67%; low risk  . Hyperlipidemia   . Invasive ductal carcinoma of breast ([place]) 2011   LEFT   . Mental disorder   . OA (osteoarthritis) of knee    RIGHT  . Pacemaker    Permanent  . PAD (peripheral artery disease) (HCC)    w/hx right iliac/SFA stenting and left and rt leg PTA  . Pain    pt states has pain in fingertips per right hand pt states has been told may be carpal tunnel  . Pneumonia    hx of   . Right sided sciatica   . Shortness of breath dyspnea  walking distances   . Sinoatrial node dysfunction (HCC)   . Sleep apnea    associated with hypersomnia uses O2 2L/M at night and during naps also uses CPAP  . Tobacco abuse   . Tremors of nervous system    hands bilat     Past Surgical History:  Procedure Laterality Date  . ATRIAL ABLATION SURGERY     x 2 - "did not help - had to have pacemaker"  . BREAST LUMPECTOMY     left breast  . CARDIAC CATHETERIZATION    . CATARACT EXTRACTION, BILATERAL     with IOL/Dr Katy Fitch  . EP IMPLANTABLE DEVICE N/A 02/13/2015   Procedure: PPM Generator Changeout-St. Jude device;  Surgeon: Deboraha Sprang, MD;  Location: [facility];  Service: Cardiovascular;  Laterality: N/A;  . MASTECTOMY, PARTIAL  02/03/2010   Left/Dr Rosenbower  . ORIF ACETABULAR FRACTURE Left 04/19/2012   Procedure: OPEN REDUCTION INTERNAL FIXATION (ORIF) ACETABULAR FRACTURE;  Surgeon: Rozanna Box, MD;  Location: [facility];  Service: Orthopedics;  Laterality: Left;  . sun spot removed      forhead  . TOTAL HIP ARTHROPLASTY Left 10/31/2014   Procedure: LEFT TOTAL HIP ARTHROPLASTY POSTERIOR  APPROACH;  Surgeon: Gaynelle Arabian, MD;  Location: [facility];  Service: Orthopedics;  Laterality: Left;  . TOTAL KNEE ARTHROPLASTY Right 10/16/2013   Procedure: RIGHT TOTAL KNEE ARTHROPLASTY;  Surgeon: Gearlean Alf, MD;  Location: [facility];  Service: Orthopedics;  Laterality: Right;  Marland Kitchen VASCULAR SURGERY     both legs     Social History   Socioeconomic History  . Marital status: Married    Spouse name: Not on file  . Number of children: Not on file  . Years of education: Not on file  . Highest education level: Not on file  Social Needs  . Financial resource strain: Not on file  . Food insecurity - worry: Not on file  . Food insecurity - inability: Not on file  . Transportation needs - medical: Not on file  . Transportation needs - non-medical: Not on file  Occupational History  . Not on file  Tobacco Use  . Smoking status: Former Smoker    Packs/day: 1.00    Years: 55.00    Pack years: 55.00    Types: Cigarettes    Last attempt to quit: 12/19/2011    Years since quitting: 5.3  . Smokeless tobacco: Never Used  . Tobacco comment: started smoking at age 68--4 cigs per day.  smoked 2ppd the last 10 yrs  Substance and Sexual Activity  . Alcohol use: Yes    Comment: wine (rare)  . Drug use: No  . Sexual activity: No  Other Topics Concern  . Not on file  Social History Narrative   HS Graduate; [employer].  Married '61.  2 sons - '70, '71; Dtrs - '64,'68;    6 grandchildren.  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 Interests -Tai-chi & water aerobics, gardening, active lifestyle.  Marriage a bit stressful - SO w/membory problems and difficult behavior. She denies any personal safety concerns. End of life care: need to address at next OV              Allergies  Allergen Reactions  . Cholestatin Other (See Comments)    RAGWEED SEASON...sneezing   . Sulfur Itching  Family History  Problem Relation Age of Onset  . Heart disease Father     Prior to Admission medications   Medication Sig Start Date End Date Taking? Authorizing Provider  acetaminophen (TYLENOL) 325 MG tablet Take 650 mg by mouth every 6 (six) hours as needed for mild pain or fever.    [provider]  atorvastatin (LIPITOR) 20 MG tablet TAKE 1 TABLET EVERY DAY 03/10/16   Deboraha Sprang, MD  B-D ULTRA-FINE 33 LANCETS MISC Use to help check blood sugars daily Dx E11.9 07/30/15   Tiffany Koch, MD  benzonatate (TESSALON) 200 MG capsule Take 1 capsule (200 mg total) by mouth 2 (two) times daily as needed for cough. 04/09/17   Mannam, Hart Robinsons, MD  chlorpheniramine-HYDROcodone (TUSSIONEX PENNKINETIC ER) 10-8 MG/5ML SUER Take 5 mLs by mouth 2 (two) times daily. 04/09/17   Mannam, Hart Robinsons, MD  Dextromethorphan-Guaifenesin (ROBAFEN DM) 10-100 MG/5ML liquid Take 10 mLs by mouth every 12 (twelve) hours as needed.    [provider]  ELIQUIS 5 MG TABS tablet TAKE 1 TABLET TWICE DAILY  (MUST  ESTABLISH  NEW  PRIMARY CARE PROVIDER  FOR FUTURE REFILLS) 11/14/15   Tiffany Koch, MD  escitalopram (LEXAPRO) 10 MG tablet  02/17/17   [provider]  Fluticasone-Salmeterol (ADVAIR DISKUS) 250-50 MCG/DOSE AEPB Inhale 1 puff into the lungs 2 (two) times daily. 03/05/17   Plotnikov, Evie Lacks, MD  furosemide (LASIX) 40 MG tablet Take 1 tablet (40 mg total) by mouth 2 (two) times daily. 12/11/16    Rama, Venetia Maxon, MD  glipiZIDE (GLUCOTROL) 5 MG tablet Take 5 mg by mouth daily before breakfast.    [provider]  glucose blood (BAYER CONTOUR TEST) test strip 1 each by Other route daily. Use to check blood sugars every day Dx E11.9 07/18/15   Tiffany Koch, MD  levalbuterol [facility] HFA) 45 MCG/ACT inhaler Inhale 2 puffs into the lungs every 6 (six) hours as needed. Reported on 09/23/2015 09/24/16   Marshell Garfinkel, MD  levalbuterol (XOPENEX) 0.63 MG/3ML nebulizer solution Take 0.63 mg by nebulization every 4 (four) hours as needed for wheezing or shortness of breath.    [provider]  LORazepam (ATIVAN) 0.5 MG tablet Take 0.5 mg by mouth 2 (two) times daily.    [provider]  memantine (NAMENDA XR) 28 MG CP24 24 hr capsule Take 1 capsule (28 mg total) by mouth daily. 03/26/17   Tiffany Koch, MD  metFORMIN (GLUCOPHAGE XR) 500 MG 24 hr tablet Take 1 tablet (500 mg total) by mouth daily with breakfast. 03/26/17   Tiffany Koch, MD  mometasone-formoterol (DULERA) 200-5 MCG/ACT AERO Inhale 1 puff into the lungs 2 (two) times daily.    [provider]  MUCINEX 600 MG 12 hr tablet TAKE ONE TABLET TWICE DAILY 09/21/16   Nche, Charlene Brooke, NP  polyethylene glycol (MIRALAX / GLYCOLAX) packet Take 17 g by mouth 2 (two) times daily. 12/11/16   Rama, Venetia Maxon, MD  potassium chloride SA (K-DUR,KLOR-CON) 20 MEQ tablet Take 1 tablet (20 mEq total) by mouth 2 (two) times daily. 12/11/16   Rama, Venetia Maxon, MD  predniSONE (DELTASONE) 10 MG tablet Take 1 tablet (10 mg total) by mouth daily with breakfast. 04/09/17   Mannam, Praveen, MD  predniSONE (DELTASONE) 20 MG tablet Take 20 mg by mouth daily with breakfast.    [provider]  Respiratory Therapy Supplies (FLUTTER) DEVI Use as directed 03/24/17   Mannam, Praveen,  MD  senna-docusate (SENOKOT-S) 8.6-50 MG tablet Take 1 tablet by mouth 2 (two) times daily. 12/11/16   Rama, Venetia Maxon, MD   SPIRIVA HANDIHALER 18 MCG inhalation capsule INHALE THE CONTENTS OF 1 CAPSULE EVERY DAY 08/12/16   Marshell Garfinkel, MD    Physical Exam: Vitals:   04/23/17 1049 04/23/17 1130 04/23/17 1200 04/23/17 1254  BP:   (!) 113/59   Pulse:  74 73   Resp:  (!) 25 (!) 25   Temp:    98.5 F (36.9 C)  TempSrc:    Oral  SpO2: 93% 92% 94%      General:  Appears somewhat lethargic sitting up in bed appears ill but in no acute distress Eyes:  PERRL, EOMI, normal lids, iris ENT:  grossly normal hearing, lips & tongue, because membranes of her mouth are pink somewhat dry Neck:  no LAD, masses or thyromegaly Cardiovascular:  RRR, no m/r/g. Trace to 1+ LE edema.  Respiratory:  Mild increased work of breathing with conversation. Breath sounds are somewhat distant and coarse throughout with fine end-expiratory wheezing. Abdomen:  soft, ntnd, NABS Skin:  no rash or induration seen on limited exam Musculoskeletal:  grossly normal tone BUE/BLE, good ROM, no bony abnormality Psychiatric:  grossly normal mood and affect, speech fluent and appropriate, AOx3 Neurologic:  CN 2-12 grossly intact, moves all extremities in coordinated fashion, sensation intact speech clear facial symmetry was commands able to make her wants and needs known  Labs on Admission: I have personally reviewed following labs and imaging studies  CBC: Recent Labs  Lab 04/23/17 0929  WBC 9.5  NEUTROABS 6.8  HGB 13.2  HCT 41.3  MCV 92.8  PLT 568   Basic Metabolic Panel: Recent Labs  Lab 04/23/17 0929  NA 141  K 4.1  CL 105  CO2 25  GLUCOSE 106*  BUN 12  CREATININE 1.21*  CALCIUM 8.5*   GFR: CrCl cannot be calculated (Unknown ideal weight.). Liver Function Tests: Recent Labs  Lab 04/23/17 0929  AST 20  ALT 14  ALKPHOS 54  BILITOT 0.8  PROT 6.3*  ALBUMIN 2.7*   Recent Labs  Lab 04/23/17 0929  LIPASE 26   No results for input(s): AMMONIA in the last 168 hours. Coagulation Profile: No results for input(s):  INR, PROTIME in the last 168 hours. Cardiac Enzymes: No results for input(s): CKTOTAL, CKMB, CKMBINDEX, TROPONINI in the last 168 hours. BNP (last 3 results) No results for input(s): PROBNP in the last 8760 hours. HbA1C: No results for input(s): HGBA1C in the last 72 hours. CBG: No results for input(s): GLUCAP in the last 168 hours. Lipid Profile: No results for input(s): CHOL, HDL, LDLCALC, TRIG, CHOLHDL, LDLDIRECT in the last 72 hours. Thyroid Function Tests: No results for input(s): TSH, T4TOTAL, FREET4, T3FREE, THYROIDAB in the last 72 hours. Anemia Panel: No results for input(s): VITAMINB12, FOLATE, FERRITIN, TIBC, IRON, RETICCTPCT in the last 72 hours. Urine analysis:    Component Value Date/Time   COLORURINE YELLOW 11/29/2016 0331   APPEARANCEUR CLEAR 11/29/2016 0331   LABSPEC 1.010 11/29/2016 0331   PHURINE 5.0 11/29/2016 0331   GLUCOSEU 50 (A) 11/29/2016 0331   HGBUR SMALL (A) 11/29/2016 0331   BILIRUBINUR NEGATIVE 11/29/2016 0331   KETONESUR NEGATIVE 11/29/2016 0331   PROTEINUR NEGATIVE 11/29/2016 0331   UROBILINOGEN 0.2 10/24/2014 1433   NITRITE NEGATIVE 11/29/2016 0331   LEUKOCYTESUR NEGATIVE 11/29/2016 0331    Creatinine Clearance: CrCl cannot be calculated (Unknown ideal weight.).  Sepsis Labs: @LABRCNTIP (procalcitonin:4,lacticidven:4) )  No results found for this or any previous visit (from the past 240 hour(s)).   Radiological Exams on Admission: Dg Chest Port 1 View  Result Date: 04/23/2017 CLINICAL DATA:  Cough and hypoxia EXAM: PORTABLE CHEST 1 VIEW COMPARISON:  PET CT 04/07/2017.  Chest x-ray 02/21/2017. FINDINGS: Interstitial coarsening that is chronic. Mild atelectatic change or scarring at the right base. No definite consolidation. Cardiomegaly. Dual-chamber pacer leads from the left. Known left upper lobe pulmonary nodule that is occult. IMPRESSION: COPD.  No acute finding when compared to prior. Electronically Signed   By: Monte Fantasia M.D.   On:  04/23/2017 09:39    FWY:OVZCH rhythm no change from previous  Assessment/Plan Principal Problem:   Acute on chronic respiratory failure with hypoxia (HCC) Active Problems:   Controlled diabetes mellitus type 2 with complications (HCC)   Obstructive sleep apnea   [name] (coronary artery disease)   Atrial fibrillation (HCC)   PACEMAKER, PERMANENT   Gold stage C. COPD with frequent exacerbations   Diastolic CHF, chronic (HCC)   GERD (gastroesophageal reflux disease)   Primary cancer of left upper lobe of lung (HCC)   Cough   Fever   Influenza A   CKD (chronic kidney disease) stage 3, GFR 30-59 ml/min (HCC)   #1. Acute on chronic respiratory failure with hypoxia. Reportedly patient found to have an oxygen saturation level of 83% on room air at the facility. She is prescribed oxygen to be worn at night is unclear how compliant she is. Likely related to influenza in the setting of COPD and recently diagnosed primary lung cancer. Chest x-ray reveals COPD no acute finding when compared to prior. Begin with a pH of 7.37, PCO2 43.5, PO2 67.0, lactic acid within the limits of normal, BNP 257 She is on daily prednisone. He also has a history of recurrent pneumonia. She's been evaluated by speech therapy who recommended dysphagia diet and aspiration precautions for which she is noncompliant at facility. She is provided with nebulizers oxygen saturation level 93% on 4 L at the time of admission -Admit to MedSurg -Continue oxygen supplementation -Scheduled nebulizers -continue prednisone at a higher dose -flutter valve -continue tamiflu -Has an outpatient oncology consultation scheduled to discuss options   #2. Influenza A. Is afebrile take acid within the limits of normal no leukocytosis. Chest x-ray as noted above -Tamiflu -Blood cultures -follow urinalysis -Supportive therapy  #3. Atrial fibrillation. Has a pacemaker. home medications include eliquis. Chadscore4. EKG as noted above currently  in sinus rhythm. -Continue home meds  #4. COPD with frequent exacerbations. Home medications include 20 mg prednisone daily. She is on home oxygen at night. Has had recurrent pneumonia. His been evaluated by speech therapy recommended dysphagia diet which she is noncompliant without facility. See above -Scheduled nebs -Increase prednisone to 60 mg daily -Oxygen supplementation -Monitor closely  #5. Chronic kidney disease stage III. Appears to be stable at baseline. -Hold nephrotoxins -Monitor intake and output -Recheck  #6. [name]/chronic systolic heart failure/SA node dysfunction. No Chest pain. Chest x-ray as noted above. EKG as noted above -Continue home meds -Monitor  #7. Diabetes. Controlled. Serum glucose of 106 on admission Home medications include metformin. -Obtain hemoglobin A1c -Sliding scale insulin for optimal control -Hold metformin for now  #8. Left upper lobe cancer. Review indicates recent visit with pulmonology. Notes indicate PET scan positive lung nodule that is suspicious for primary lung cancer. Indicates not recommending bronchoscopic NAV biopsy. Note indicates consideration of radiation therapy with or without CT-guided biopsy  may be best approach. She is nonicteric good candidate for curative resection given her poor functional status, cardiac and pulmonary issues however note indicates she wants to explore this option and discuss further with oncology. -Outpatient follow-up with oncology    DVT prophylaxis: eliquis  Code Status: dnr Family Communication: none present Disposition Plan: back to [place] called: none  Admission status: observation    Radene Gunning MD [facility]  If 7PM-7AM, please contact night-coverage [email or web address] Password [email or web address]  04/23/2017, 1:04 PM

## 2017-04-23 NOTE — ED Triage Notes (Signed)
Patient from [place] by [facility] for an oxygen saturation of 78% this morning. Denies any increased shortness of breath from baseline, no chest pain, no weakness. No complaints other than cough. Patient wears 2L O2 [place] as needed. Patient arrives on NRB, O2 decreased to 83% on room air after NRB, placed on 4L O2 , O2 saturation 92%. Alert and oriented at baseline.  Patient also states she will not allow another blood pressure to be taken after initial one. Patient yelled that the cuff became too tight while measuring pressure.

## 2017-04-23 NOTE — ED Notes (Signed)
This RN attempted report to [facility] 3 times and spoke with the CN and  [facility] may not be taking any new patient, waiting to hear from [facility]

## 2017-04-23 NOTE — ED Provider Notes (Signed)
[place] EMERGENCY DEPARTMENT Provider Note   CSN: [id number] Arrival date & time: 04/23/17  0901     History   Chief Complaint Chief Complaint  Patient presents with  . Cough    HPI [name] is a 84 y.o. female.  HPI Morning rounds at [facility], nursing staff found the patient to be 83% on room air.  Patient reports that she wears home oxygen at night.  She thinks it may have come off.  She denies significant associated symptoms.  Patient does however have ill appearance.  With further questioning she does admit that she did not feel well yesterday.  She has a hard time qualifying in what way.  Does have wet cough.  She does admit that has increased somewhat over the past day.  Denies any localizing pain. Past Medical History:  Diagnosis Date  . Acute blood loss anemia 04/20/2012  . AF (atrial fibrillation) ([place])     AV ablation 9/09 [place] per Dr Ola Spurr - AV node ablation 9/11 Dr Caryl Comes  . Amiodarone pulmonary toxicity   . Ascending aortic aneurysm ([place]) 12/11/2016  . Asthma   . Bronchitis    hx of   . [name] (coronary artery disease)    (not sure of this 11/10  . CHF (congestive heart failure) ([place])   . Chronic renal insufficiency, stage III (moderate) (HCC)    CrCl about 60 ml/min  . CKD (chronic kidney disease) stage 3, GFR 30-59 ml/min (HCC)   . Complication of anesthesia    "psycotic episode" after hip surg - resolved  . COPD (chronic obstructive pulmonary disease) (HCC)    emphysema -FeV1 73% DLCO 53% 5/09  . CVA (cerebral vascular accident) ([place])    no residual effects evident to pt   . Depression 06/06/2012  . Diabetes ([place])   . Diastolic heart failure    Acute on Chronic  . Eczema   . GERD (gastroesophageal reflux disease)   . History of skin cancer   . Hx of cardiovascular stress test    Lexiscan Myoview (09/2013):  No definite ischemia, EF 67%; low risk  . Hyperlipidemia   . Invasive ductal carcinoma of breast ([place]) 2011   LEFT   . Mental disorder   . OA (osteoarthritis) of knee    RIGHT  . Pacemaker    Permanent  . PAD (peripheral artery disease) (HCC)    w/hx right iliac/SFA stenting and left and rt leg PTA  . Pain    pt states has pain in fingertips per right hand pt states has been told may be carpal tunnel  . Pneumonia    hx of   . Right sided sciatica   . Shortness of breath dyspnea    walking distances   . Sinoatrial node dysfunction (HCC)   . Sleep apnea    associated with hypersomnia uses O2 2L/M at night and during naps also uses CPAP  . Tobacco abuse   . Tremors of nervous system    hands bilat     Patient Active Problem List   Diagnosis Date Noted  . Primary cancer of left upper lobe of lung ([place]) 04/12/2017  . Recurrent pneumonia 03/05/2017  . Pulmonary nodule 12/11/2016  . Ascending aortic aneurysm ([place]) 12/11/2016  . Agitation   . Acute on chronic respiratory failure with hypoxia ([place]) 11/28/2016  . HCAP (healthcare-associated pneumonia) 11/28/2016  . Acute renal failure superimposed on stage 3 chronic kidney disease ([place]) 11/28/2016  . Gait  instability 06/22/2016  . Chronic respiratory failure ([place]) 05/15/2016  . Rotator cuff tear arthropathy of both shoulders 12/09/2015  . Complete heart block ([place]) 02/13/2015  . Acute blood loss anemia 01/28/2015  . Constipation 11/07/2014  . OA (osteoarthritis) of hip 10/31/2014  . Osteoporosis 01/10/2014  . Nicotine dependence in remission 06/28/2013  . GERD (gastroesophageal reflux disease) 07/19/2012  . Depression 06/06/2012  . Secondary hyperparathyroidism ([place]) 04/25/2012  . Gold stage C. COPD with frequent exacerbations 12/15/2011  . Diastolic CHF, chronic ([place]) 12/15/2011  . Goals of care, counseling/discussion 08/27/2011  . History of stroke 02/21/2010  . PACEMAKER, PERMANENT 02/21/2010  . Malignant neoplasm of upper-inner quadrant of left breast in female, estrogen receptor positive ([place]) 02/03/2010  . Allergic rhinitis  12/31/2009  . Carotid stenosis 08/17/2008  . Controlled diabetes mellitus type 2 with complications ([place]) 03/70/4888  . Obstructive sleep apnea 06/01/2008  . Atrial fibrillation ([place]) 06/01/2008  . [name] 06/01/2008  . Hyperlipidemia 12/30/2006  . [name] (coronary artery disease) 12/30/2006    Past Surgical History:  Procedure Laterality Date  . ATRIAL ABLATION SURGERY     x 2 - "did not help - had to have pacemaker"  . BREAST LUMPECTOMY     left breast  . CARDIAC CATHETERIZATION    . CATARACT EXTRACTION, BILATERAL     with IOL/Dr Katy Fitch  . EP IMPLANTABLE DEVICE N/A 02/13/2015   Procedure: PPM Generator Changeout-[employer] device;  Surgeon: Deboraha Sprang, MD;  Location: [facility];  Service: Cardiovascular;  Laterality: N/A;  . MASTECTOMY, PARTIAL  02/03/2010   Left/Dr Rosenbower  . ORIF ACETABULAR FRACTURE Left 04/19/2012   Procedure: OPEN REDUCTION INTERNAL FIXATION (ORIF) ACETABULAR FRACTURE;  Surgeon: Rozanna Box, MD;  Location: [facility];  Service: Orthopedics;  Laterality: Left;  . sun spot removed      forhead  . TOTAL HIP ARTHROPLASTY Left 10/31/2014   Procedure: LEFT TOTAL HIP ARTHROPLASTY POSTERIOR  APPROACH;  Surgeon: Gaynelle Arabian, MD;  Location: [facility];  Service: Orthopedics;  Laterality: Left;  . TOTAL KNEE ARTHROPLASTY Right 10/16/2013   Procedure: RIGHT TOTAL KNEE ARTHROPLASTY;  Surgeon: Gearlean Alf, MD;  Location: [facility];  Service: Orthopedics;  Laterality: Right;  Marland Kitchen VASCULAR SURGERY     both legs     OB History    No data available       Home Medications    Prior to Admission medications   Medication Sig Start Date End Date Taking? Authorizing Provider  acetaminophen (TYLENOL) 325 MG tablet Take 650 mg by mouth every 6 (six) hours as needed for mild pain or fever.    [provider]  atorvastatin (LIPITOR) 20 MG tablet TAKE 1 TABLET EVERY DAY 03/10/16   Deboraha Sprang, MD  B-D ULTRA-FINE 33 LANCETS MISC Use to help check blood sugars daily Dx  E11.9 07/30/15   Hoyt Koch, MD  benzonatate (TESSALON) 200 MG capsule Take 1 capsule (200 mg total) by mouth 2 (two) times daily as needed for cough. 04/09/17   Mannam, Hart Robinsons, MD  chlorpheniramine-HYDROcodone (TUSSIONEX PENNKINETIC ER) 10-8 MG/5ML SUER Take 5 mLs by mouth 2 (two) times daily. 04/09/17   Mannam, Hart Robinsons, MD  Dextromethorphan-Guaifenesin (ROBAFEN DM) 10-100 MG/5ML liquid Take 10 mLs by mouth every 12 (twelve) hours as needed.    [provider]  ELIQUIS 5 MG TABS tablet TAKE 1 TABLET TWICE DAILY  (MUST  ESTABLISH  NEW  PRIMARY CARE PROVIDER  FOR FUTURE REFILLS) 11/14/15  Hoyt Koch, MD  escitalopram (LEXAPRO) 10 MG tablet  02/17/17   [provider]  Fluticasone-Salmeterol (ADVAIR DISKUS) 250-50 MCG/DOSE AEPB Inhale 1 puff into the lungs 2 (two) times daily. 03/05/17   Plotnikov, Evie Lacks, MD  furosemide (LASIX) 40 MG tablet Take 1 tablet (40 mg total) by mouth 2 (two) times daily. 12/11/16   Rama, Venetia Maxon, MD  glipiZIDE (GLUCOTROL) 5 MG tablet Take 5 mg by mouth daily before breakfast.    [provider]  glucose blood (BAYER CONTOUR TEST) test strip 1 each by Other route daily. Use to check blood sugars every day Dx E11.9 07/18/15   Hoyt Koch, MD  levalbuterol [facility] HFA) 45 MCG/ACT inhaler Inhale 2 puffs into the lungs every 6 (six) hours as needed. Reported on 09/23/2015 09/24/16   Marshell Garfinkel, MD  levalbuterol (XOPENEX) 0.63 MG/3ML nebulizer solution Take 0.63 mg by nebulization every 4 (four) hours as needed for wheezing or shortness of breath.    [provider]  LORazepam (ATIVAN) 0.5 MG tablet Take 0.5 mg by mouth 2 (two) times daily.    [provider]  memantine (NAMENDA XR) 28 MG CP24 24 hr capsule Take 1 capsule (28 mg total) by mouth daily. 03/26/17   Hoyt Koch, MD  metFORMIN (GLUCOPHAGE XR) 500 MG 24 hr tablet Take 1 tablet (500 mg total) by mouth daily with breakfast. 03/26/17    Hoyt Koch, MD  mometasone-formoterol (DULERA) 200-5 MCG/ACT AERO Inhale 1 puff into the lungs 2 (two) times daily.    [provider]  MUCINEX 600 MG 12 hr tablet TAKE ONE TABLET TWICE DAILY 09/21/16   Nche, Charlene Brooke, NP  polyethylene glycol (MIRALAX / GLYCOLAX) packet Take 17 g by mouth 2 (two) times daily. 12/11/16   Rama, Venetia Maxon, MD  potassium chloride SA (K-DUR,KLOR-CON) 20 MEQ tablet Take 1 tablet (20 mEq total) by mouth 2 (two) times daily. 12/11/16   Rama, Venetia Maxon, MD  predniSONE (DELTASONE) 10 MG tablet Take 1 tablet (10 mg total) by mouth daily with breakfast. 04/09/17   Mannam, Praveen, MD  predniSONE (DELTASONE) 20 MG tablet Take 20 mg by mouth daily with breakfast.    [provider]  Respiratory Therapy Supplies (FLUTTER) DEVI Use as directed 03/24/17   Mannam, Praveen, MD  senna-docusate (SENOKOT-S) 8.6-50 MG tablet Take 1 tablet by mouth 2 (two) times daily. 12/11/16   Rama, Venetia Maxon, MD  SPIRIVA HANDIHALER 18 MCG inhalation capsule INHALE THE CONTENTS OF 1 CAPSULE EVERY DAY 08/12/16   Marshell Garfinkel, MD    Family History Family History  Problem Relation Age of Onset  . Heart disease Father     Social History Social History   Tobacco Use  . Smoking status: Former Smoker    Packs/day: 1.00    Years: 55.00    Pack years: 55.00    Types: Cigarettes    Last attempt to quit: 12/19/2011    Years since quitting: 5.3  . Smokeless tobacco: Never Used  . Tobacco comment: started smoking at age 39--4 cigs per day.  smoked 2ppd the last 10 yrs  Substance Use Topics  . Alcohol use: Yes    Comment: wine (rare)  . Drug use: No     Allergies   Cholestatin and Sulfur   Review of Systems Review of Systems 10 Systems reviewed and are negative for acute change except as noted in the HPI.  Physical Exam Updated Vital Signs BP 137/68  Pulse 63   Temp (!) 101.9 F (38.8 C) (Rectal)   Resp (!) 30   SpO2 93%   Physical Exam    Constitutional:  Patient appears fatigued and mildly ill.  She is alert and interactive.  Mild to moderate increased work of breathing.  HENT:  Head: Normocephalic and atraumatic.  Mucous membranes are moist.  Patient has some glossitis.  His membranes slightly hyperemic.  Eyes: EOM are normal.  Cardiovascular:  Heart sounds regular.  Non-tachycardic.  No gross rub murmur gallop.  Pulmonary/Chest:  Mild to moderate increased work of breathing.  Periodic wet cough.  Patient has rhonchi and wheeze throughout the lung fields.  Fair airflow to the bases.  Abdominal: Soft. She exhibits no distension. There is no tenderness. There is no guarding.  Musculoskeletal: Normal range of motion. She exhibits no edema or tenderness.  No peripheral edema.  No localizing calf tenderness.  Condition of feet is good without wounds or swelling.  Neurological: She is alert. No cranial nerve deficit. She exhibits normal muscle tone. Coordination normal.  Patient is alert.  She appears slightly fatigued.  She is however cognitively intact with appropriate response to questions.  She is assisting in following commands.  No localizing neurologic deficit.  She uses both upper extremities to reposition herself.  She can spontaneously move both lower extremities.  Skin: Skin is warm and dry.  Psychiatric: She has a normal mood and affect.     ED Treatments / Results  Labs (all labs ordered are listed, but only abnormal results are displayed) Labs Reviewed  COMPREHENSIVE METABOLIC PANEL - Abnormal; Notable for the following components:      Result Value   Glucose, Bld 106 (*)    Creatinine, Ser 1.21 (*)    Calcium 8.5 (*)    Total Protein 6.3 (*)    Albumin 2.7 (*)    GFR calc non Af Amer 40 (*)    GFR calc Af Amer 47 (*)    All other components within normal limits  CBC WITH DIFFERENTIAL/PLATELET - Abnormal; Notable for the following components:   RDW 17.2 (*)    Monocytes Absolute 1.2 (*)    All other  components within normal limits  BRAIN NATRIURETIC PEPTIDE - Abnormal; Notable for the following components:   B Natriuretic Peptide 275.0 (*)    All other components within normal limits  INFLUENZA PANEL BY PCR (TYPE A & B) - Abnormal; Notable for the following components:   Influenza A By PCR POSITIVE (*)    All other components within normal limits  I-STAT ARTERIAL BLOOD GAS, ED - Abnormal; Notable for the following components:   pO2, Arterial 67.0 (*)    All other components within normal limits  CULTURE, BLOOD (ROUTINE X 2)  CULTURE, BLOOD (ROUTINE X 2)  URINE CULTURE  LIPASE, BLOOD  URINALYSIS, ROUTINE W REFLEX MICROSCOPIC  I-STAT CG4 LACTIC ACID, ED  I-STAT CG4 LACTIC ACID, ED    EKG  EKG Interpretation  Date/Time:  Friday April 23 2017 09:16:32 EST Ventricular Rate:  74 PR Interval:    QRS Duration: 156 QT Interval:  414 QTC Calculation: 460 R Axis:   -79 Text Interpretation:  Sinus rhythm no change from previous Confirmed by Charlesetta Shanks [phone]) on 04/23/2017 9:21:48 AM       Radiology Dg Chest Port 1 View  Result Date: 04/23/2017 CLINICAL DATA:  Cough and hypoxia EXAM: PORTABLE CHEST 1 VIEW COMPARISON:  PET CT 04/07/2017.  Chest x-ray 02/21/2017. FINDINGS:  Interstitial coarsening that is chronic. Mild atelectatic change or scarring at the right base. No definite consolidation. Cardiomegaly. Dual-chamber pacer leads from the left. Known left upper lobe pulmonary nodule that is occult. IMPRESSION: COPD.  No acute finding when compared to prior. Electronically Signed   By: Monte Fantasia M.D.   On: 04/23/2017 09:39    Procedures Procedures (including critical care time)  Medications Ordered in ED Medications  0.9 %  sodium chloride infusion (1,000 mLs Intravenous New Bag/Given 04/23/17 0934)  oseltamivir (TAMIFLU) capsule 75 mg (not administered)  ipratropium-albuterol (DUONEB) 0.5-2.5 (3) MG/3ML nebulizer solution 3 mL (3 mLs Nebulization Given 04/23/17  1049)  acetaminophen (TYLENOL) tablet 1,000 mg (1,000 mg Oral Given 04/23/17 1117)     Initial Impression / Assessment and Plan / ED Course  I have reviewed the triage vital signs and the nursing notes.  Pertinent labs & imaging results that were available during my care of the patient were reviewed by me and considered in my medical decision making (see chart for details).      Final Clinical Impressions(s) / ED Diagnoses   Final diagnoses:  Influenza A  COPD exacerbation ([place])  Hypoxia  Severe comorbid illness  Patient developed hypoxia this morning.  In the emergency department, fever identified at 101.  Patient does have coarse breath sounds with known COPD and more recent diagnosis of lung cancer.  Vital signs have been stable however patient is requiring 3-1/2 L oxygen for O2 sat at 93%.  Patient test positive for influenza A.  Will initiate Tamiflu and plan for admission for management of influenza in patient with severe comorbid illness and acute COPD exacerbation.  ED Discharge Orders    None       Charlesetta Shanks, MD 04/23/17 1204

## 2017-04-23 NOTE — Progress Notes (Signed)
Patient admitted to 6n25, alert and oriented, reports no pain, VSS stable pt refused BP due to arm hurting. IV connected. Oriented to room and staff, will continue to monitor.

## 2017-04-23 NOTE — Progress Notes (Signed)
PHARMACY NOTE:  ANTIMICROBIAL RENAL DOSAGE ADJUSTMENT  Current antimicrobial regimen includes a mismatch between antimicrobial dosage and estimated renal function.  As per policy approved by the Pharmacy & Therapeutics and Medical Executive Committees, the antimicrobial dosage will be adjusted accordingly.  Current antimicrobial dosage:  Tamiflu 75mg  BID  Indication: Influenza  Renal Function:  Estimated Creatinine Clearance: 36.5 mL/min (A) (by C-G formula based on SCr of 1.21 mg/dL (H)). []      On intermittent HD, scheduled: []      On CRRT    Antimicrobial dosage has been changed to:  Tamiflu 30mg  BID   Thank you for allowing pharmacy to be a part of this patient's care.  Arrie Senate, PharmD, BCPS PGY-2 Cardiology Pharmacy Resident Pager: [phone] 04/23/2017

## 2017-04-24 DIAGNOSIS — L8990 Pressure ulcer of unspecified site, unspecified stage: Secondary | ICD-10-CM | POA: Diagnosis not present

## 2017-04-24 DIAGNOSIS — J441 Chronic obstructive pulmonary disease with (acute) exacerbation: Secondary | ICD-10-CM | POA: Diagnosis not present

## 2017-04-24 DIAGNOSIS — Z853 Personal history of malignant neoplasm of breast: Secondary | ICD-10-CM | POA: Diagnosis not present

## 2017-04-24 DIAGNOSIS — J690 Pneumonitis due to inhalation of food and vomit: Secondary | ICD-10-CM | POA: Diagnosis not present

## 2017-04-24 DIAGNOSIS — J101 Influenza due to other identified influenza virus with other respiratory manifestations: Principal | ICD-10-CM

## 2017-04-24 DIAGNOSIS — J9621 Acute and chronic respiratory failure with hypoxia: Secondary | ICD-10-CM | POA: Diagnosis not present

## 2017-04-24 DIAGNOSIS — N183 Chronic kidney disease, stage 3 (moderate): Secondary | ICD-10-CM | POA: Diagnosis not present

## 2017-04-24 DIAGNOSIS — R69 Illness, unspecified: Secondary | ICD-10-CM | POA: Diagnosis not present

## 2017-04-24 DIAGNOSIS — Z950 Presence of cardiac pacemaker: Secondary | ICD-10-CM

## 2017-04-24 DIAGNOSIS — F329 Major depressive disorder, single episode, unspecified: Secondary | ICD-10-CM | POA: Diagnosis present

## 2017-04-24 DIAGNOSIS — F419 Anxiety disorder, unspecified: Secondary | ICD-10-CM | POA: Diagnosis present

## 2017-04-24 DIAGNOSIS — E118 Type 2 diabetes mellitus with unspecified complications: Secondary | ICD-10-CM | POA: Diagnosis not present

## 2017-04-24 DIAGNOSIS — Z9119 Patient's noncompliance with other medical treatment and regimen: Secondary | ICD-10-CM | POA: Diagnosis not present

## 2017-04-24 DIAGNOSIS — Z66 Do not resuscitate: Secondary | ICD-10-CM | POA: Diagnosis present

## 2017-04-24 DIAGNOSIS — Z9981 Dependence on supplemental oxygen: Secondary | ICD-10-CM | POA: Diagnosis not present

## 2017-04-24 DIAGNOSIS — I25810 Atherosclerosis of coronary artery bypass graft(s) without angina pectoris: Secondary | ICD-10-CM | POA: Diagnosis not present

## 2017-04-24 DIAGNOSIS — I2510 Atherosclerotic heart disease of native coronary artery without angina pectoris: Secondary | ICD-10-CM

## 2017-04-24 DIAGNOSIS — C3412 Malignant neoplasm of upper lobe, left bronchus or lung: Secondary | ICD-10-CM | POA: Diagnosis present

## 2017-04-24 DIAGNOSIS — R0902 Hypoxemia: Secondary | ICD-10-CM | POA: Diagnosis not present

## 2017-04-24 DIAGNOSIS — R0602 Shortness of breath: Secondary | ICD-10-CM | POA: Diagnosis not present

## 2017-04-24 DIAGNOSIS — I4891 Unspecified atrial fibrillation: Secondary | ICD-10-CM | POA: Diagnosis present

## 2017-04-24 DIAGNOSIS — I5030 Unspecified diastolic (congestive) heart failure: Secondary | ICD-10-CM | POA: Diagnosis not present

## 2017-04-24 DIAGNOSIS — J449 Chronic obstructive pulmonary disease, unspecified: Secondary | ICD-10-CM | POA: Diagnosis not present

## 2017-04-24 DIAGNOSIS — I482 Chronic atrial fibrillation: Secondary | ICD-10-CM | POA: Diagnosis not present

## 2017-04-24 DIAGNOSIS — Z87891 Personal history of nicotine dependence: Secondary | ICD-10-CM | POA: Diagnosis not present

## 2017-04-24 DIAGNOSIS — I679 Cerebrovascular disease, unspecified: Secondary | ICD-10-CM | POA: Diagnosis not present

## 2017-04-24 DIAGNOSIS — E1151 Type 2 diabetes mellitus with diabetic peripheral angiopathy without gangrene: Secondary | ICD-10-CM | POA: Diagnosis present

## 2017-04-24 DIAGNOSIS — E1159 Type 2 diabetes mellitus with other circulatory complications: Secondary | ICD-10-CM | POA: Diagnosis not present

## 2017-04-24 DIAGNOSIS — I5032 Chronic diastolic (congestive) heart failure: Secondary | ICD-10-CM | POA: Diagnosis not present

## 2017-04-24 DIAGNOSIS — C3490 Malignant neoplasm of unspecified part of unspecified bronchus or lung: Secondary | ICD-10-CM | POA: Diagnosis not present

## 2017-04-24 DIAGNOSIS — Z515 Encounter for palliative care: Secondary | ICD-10-CM | POA: Diagnosis not present

## 2017-04-24 DIAGNOSIS — F0151 Vascular dementia with behavioral disturbance: Secondary | ICD-10-CM | POA: Diagnosis not present

## 2017-04-24 DIAGNOSIS — I714 Abdominal aortic aneurysm, without rupture: Secondary | ICD-10-CM | POA: Diagnosis present

## 2017-04-24 DIAGNOSIS — Z8673 Personal history of transient ischemic attack (TIA), and cerebral infarction without residual deficits: Secondary | ICD-10-CM | POA: Diagnosis not present

## 2017-04-24 DIAGNOSIS — I70209 Unspecified atherosclerosis of native arteries of extremities, unspecified extremity: Secondary | ICD-10-CM | POA: Diagnosis not present

## 2017-04-24 DIAGNOSIS — R1310 Dysphagia, unspecified: Secondary | ICD-10-CM | POA: Diagnosis present

## 2017-04-24 DIAGNOSIS — E1122 Type 2 diabetes mellitus with diabetic chronic kidney disease: Secondary | ICD-10-CM | POA: Diagnosis present

## 2017-04-24 DIAGNOSIS — Z96642 Presence of left artificial hip joint: Secondary | ICD-10-CM | POA: Diagnosis present

## 2017-04-24 DIAGNOSIS — G4733 Obstructive sleep apnea (adult) (pediatric): Secondary | ICD-10-CM | POA: Diagnosis not present

## 2017-04-24 DIAGNOSIS — K219 Gastro-esophageal reflux disease without esophagitis: Secondary | ICD-10-CM | POA: Diagnosis not present

## 2017-04-24 DIAGNOSIS — I495 Sick sinus syndrome: Secondary | ICD-10-CM | POA: Diagnosis not present

## 2017-04-24 LAB — BASIC METABOLIC PANEL
Anion gap: 10 (ref 5–15)
BUN: 14 mg/dL (ref 6–20)
CHLORIDE: 106 mmol/L (ref 101–111)
CO2: 24 mmol/L (ref 22–32)
Calcium: 8.9 mg/dL (ref 8.9–10.3)
Creatinine, Ser: 1.1 mg/dL — ABNORMAL HIGH (ref 0.44–1.00)
GFR calc Af Amer: 52 mL/min — ABNORMAL LOW (ref >60)
GFR calc non Af Amer: 45 mL/min — ABNORMAL LOW (ref >60)
GLUCOSE: 197 mg/dL — AB (ref 65–99)
POTASSIUM: 4.6 mmol/L (ref 3.5–5.1)
Sodium: 140 mmol/L (ref 135–145)

## 2017-04-24 LAB — URINALYSIS, ROUTINE W REFLEX MICROSCOPIC
Bilirubin Urine: NEGATIVE
Glucose, UA: NEGATIVE mg/dL
Hgb urine dipstick: NEGATIVE
KETONES UR: NEGATIVE mg/dL
LEUKOCYTES UA: NEGATIVE
NITRITE: NEGATIVE
PROTEIN: NEGATIVE mg/dL
Specific Gravity, Urine: 1.013 (ref 1.005–1.030)
pH: 5 (ref 5.0–8.0)

## 2017-04-24 LAB — CBC
HEMATOCRIT: 40.3 % (ref 36.0–46.0)
Hemoglobin: 12.6 g/dL (ref 12.0–15.0)
MCH: 29.2 pg (ref 26.0–34.0)
MCHC: 31.3 g/dL (ref 30.0–36.0)
MCV: 93.5 fL (ref 78.0–100.0)
Platelets: 182 10*3/uL (ref 150–400)
RBC: 4.31 MIL/uL (ref 3.87–5.11)
RDW: 17 % — AB (ref 11.5–15.5)
WBC: 7.6 10*3/uL (ref 4.0–10.5)

## 2017-04-24 LAB — GLUCOSE, CAPILLARY
Glucose-Capillary: 140 mg/dL — ABNORMAL HIGH (ref 65–99)
Glucose-Capillary: 137 mg/dL — ABNORMAL HIGH (ref 65–99)
Glucose-Capillary: 306 mg/dL — ABNORMAL HIGH (ref 65–99)
Glucose-Capillary: 240 mg/dL — ABNORMAL HIGH (ref 65–99)

## 2017-04-24 MED ORDER — FUROSEMIDE 40 MG PO TABS
40.0000 mg | ORAL_TABLET | Freq: Two times a day (BID) | ORAL | Status: DC
  Administered 2017-04-24 – 2017-04-29 (×10): 40 mg via ORAL
  Filled 2017-04-24 (×10): qty 1

## 2017-04-24 MED ORDER — ATORVASTATIN CALCIUM 20 MG PO TABS
20.0000 mg | ORAL_TABLET | Freq: Every day | ORAL | Status: DC
  Administered 2017-04-24 – 2017-04-28 (×5): 20 mg via ORAL
  Filled 2017-04-24 (×5): qty 1

## 2017-04-24 MED ORDER — MEMANTINE HCL ER 28 MG PO CP24
28.0000 mg | ORAL_CAPSULE | Freq: Every day | ORAL | Status: DC
  Administered 2017-04-24 – 2017-04-29 (×6): 28 mg via ORAL
  Filled 2017-04-24 (×6): qty 1

## 2017-04-24 MED ORDER — POTASSIUM CHLORIDE CRYS ER 20 MEQ PO TBCR: 20.0000 meq | EXTENDED_RELEASE_TABLET | Freq: Two times a day (BID) | ORAL | Status: DC

## 2017-04-24 NOTE — Progress Notes (Signed)
PROGRESS NOTE  [name]  RSW:[id number] DOB: [birth year] DOA: 04/23/2017 PCP: Hoyt Koch, MD  Outpatient Specialists: Vaughan Browner, pulmonology Brief Narrative: [name] is an 84 y.o. female with a history of COPD nonadherent to home oxygen, AFib on eliquis s/p PPM, recurrent aspiration PNA, T2DM, and recently diagnosed lung cancer without tissue Dx or Tx thus far who presented to the ED from ALF due to fever and hypoxia in the setting of recent increasing dyspnea and increased sputum production and wheezing in the ED. Influenza swab was positive in the ED without infiltrate on CXR. Nebulizers, steroids, and tamiflu were started and the patient admitted.   Assessment & Plan: Principal Problem:   Acute on chronic respiratory failure with hypoxia (HCC) Active Problems:   Controlled diabetes mellitus type 2 with complications (HCC)   Obstructive sleep apnea   [name] (coronary artery disease)   Atrial fibrillation (HCC)   PACEMAKER, PERMANENT   Gold stage C. COPD with frequent exacerbations   Diastolic CHF, chronic (HCC)   GERD (gastroesophageal reflux disease)   Primary cancer of left upper lobe of lung (HCC)   Cough   Fever   Influenza A   CKD (chronic kidney disease) stage 3, GFR 30-59 ml/min (HCC)  Acute on chronic hypoxic respiratory failure: Due to influenza and COPD exacerbation in patient with limited pulmonary reserve due to severe COPD, recurrent aspiration PNA, OSA not compliant with CPAP, and likely bronchogenic carcinoma. - Continue supplemental oxygen and treat conditions as below. - SLP has recommended dysphagia diet which the patient has declined. Will not reorder evaluation.  - Would consider a palliative care consult regardless of discussion with oncology.   GOLD D COPD with exacerbation due to influenza A: PFTs recently showed moderate COPD with severe reduction in DLCO - Bronchodilators scheduled and prn - Tamiflu x5 days - Continue prednisone burst  (had recently been decreased to 10mg  daily) - Flutter valve - Hold abx with negative CXR for now.   PET + left upper lobe pulmonary nodule: Strongly suggestive of bronchogenic CA in former heavy smoker: Pt and son discussed with pulmonology, Dr. Vaughan Browner as outpatient. He felt risks were prohibitive of bronchoscopic biopsy, CT-guided biopsy, and resection due to severity of comorbidities/frailty. Radiation therapy was considered.  - Planning to discuss with oncology, Dr. Jana Hakim at appointment on 2/19. If patient remains inpatient 2/18, will touch base with Dr. Jana Hakim.   Chronic kidney disease stage III. Appears to be stable at baseline. - Hold nephrotoxins (DC toradol) - Monitor  Chronic systolic heart failure, ischemic cardiomyopathy: BNP 275. - DC IVF's, continue home medications - Strict I/O, daily weights  AFib s/p failed ablations now s/p PPM: CHA2DS2-VASc is 4.  - Continue home medications  T2DM: HbA1c 7.7%, exacerbated by ongoing steroid use. - Hold home medications: Glipizide, metformin - SSI  Depression, anxiety, memory impairment:  - Continue SSRI, benzodiazepine, namenda home meds  DVT prophylaxis: Eliquis Code Status: DNR Family Communication: Discussed with daughter-in-law Disposition Plan: Patient resolved to return to ALF  Consultants: Oncology Procedures: None Antimicrobials: Tamiflu   Subjective: Having a hard time breathing just with transfer to [facility]. No chest pain. Nebs improve this feeling transiently. +Wheezing, +Cough.   Objective: Vitals:   04/23/17 2203 04/24/17 0458 04/24/17 0743 04/24/17 1250  BP: (!) 142/66 140/63    Pulse: 76 72  78  Resp: 19 18  (!) 22  Temp: 98.2 F (36.8 C) 98 F (36.7 C)  98.1 F (36.7 C)  TempSrc: Oral  Oral  SpO2: 92% 93% 91% 96%  Weight:      Height:        Intake/Output Summary (Last 24 hours) at 04/24/2017 1654 Last data filed at 04/24/2017 1500 Gross per 24 hour  Intake 627.5 ml  Output 550 ml  Net 77.5  ml   Filed Weights   04/23/17 1850  Weight: 78.6 kg (173 lb 4.5 oz)    Gen: Frail elderly female in no distress Pulm: Mildly labored tachypnea with 4L O2. Diffuse bilateral rhonchi without crackles.  CV: Regular rate and rhythm. No murmur, rub, or gallop. No JVD, trace pedal edema. GI: Abdomen soft, non-tender, non-distended, with normoactive bowel sounds. No organomegaly or masses felt. Ext: Warm, no deformities Skin: No rashes, lesions or ulcers Neuro: Alert and oriented. No focal neurological deficits. Psych: Judgement and insight appear fair. Mood & affect appropriate.   Data Reviewed: I have personally reviewed following labs and imaging studies  CBC: Recent Labs  Lab 04/23/17 0929 04/24/17 0339  WBC 9.5 7.6  NEUTROABS 6.8  --   HGB 13.2 12.6  HCT 41.3 40.3  MCV 92.8 93.5  PLT 181 951   Basic Metabolic Panel: Recent Labs  Lab 04/23/17 0929 04/24/17 0339  NA 141 140  K 4.1 4.6  CL 105 106  CO2 25 24  GLUCOSE 106* 197*  BUN 12 14  CREATININE 1.21* 1.10*  CALCIUM 8.5* 8.9   GFR: Estimated Creatinine Clearance: 40.1 mL/min (A) (by C-G formula based on SCr of 1.1 mg/dL (H)). Liver Function Tests: Recent Labs  Lab 04/23/17 0929  AST 20  ALT 14  ALKPHOS 54  BILITOT 0.8  PROT 6.3*  ALBUMIN 2.7*   Recent Labs  Lab 04/23/17 0929  LIPASE 26   No results for input(s): AMMONIA in the last 168 hours. Coagulation Profile: No results for input(s): INR, PROTIME in the last 168 hours. Cardiac Enzymes: No results for input(s): CKTOTAL, CKMB, CKMBINDEX, TROPONINI in the last 168 hours. BNP (last 3 results) No results for input(s): PROBNP in the last 8760 hours. HbA1C: Recent Labs    04/23/17 1347  HGBA1C 7.7*   CBG: Recent Labs  Lab 04/23/17 1826 04/23/17 2154 04/24/17 0739 04/24/17 1201  GLUCAP 97 212* 140* 137*   Lipid Profile: No results for input(s): CHOL, HDL, LDLCALC, TRIG, CHOLHDL, LDLDIRECT in the last 72 hours. Thyroid Function  Tests: No results for input(s): TSH, T4TOTAL, FREET4, T3FREE, THYROIDAB in the last 72 hours. Anemia Panel: No results for input(s): VITAMINB12, FOLATE, FERRITIN, TIBC, IRON, RETICCTPCT in the last 72 hours. Urine analysis:    Component Value Date/Time   COLORURINE YELLOW 04/24/2017 0721   APPEARANCEUR CLEAR 04/24/2017 0721   LABSPEC 1.013 04/24/2017 0721   PHURINE 5.0 04/24/2017 0721   GLUCOSEU NEGATIVE 04/24/2017 0721   HGBUR NEGATIVE 04/24/2017 0721   BILIRUBINUR NEGATIVE 04/24/2017 0721   KETONESUR NEGATIVE 04/24/2017 0721   PROTEINUR NEGATIVE 04/24/2017 0721   UROBILINOGEN 0.2 10/24/2014 1433   NITRITE NEGATIVE 04/24/2017 0721   LEUKOCYTESUR NEGATIVE 04/24/2017 0721   Recent Results (from the past 240 hour(s))  Blood Culture (routine x 2)     Status: None (Preliminary result)   Collection Time: 04/23/17  9:36 AM  Result Value Ref Range Status   Specimen Description BLOOD LEFT ANTECUBITAL  Final   Special Requests   Final    BOTTLES DRAWN AEROBIC AND ANAEROBIC Blood Culture adequate volume   Culture   Final    NO GROWTH 1 DAY Performed at  [facility], [place] [address], [place], [place] [address]    Report Status PENDING  Incomplete  Blood Culture (routine x 2)     Status: None (Preliminary result)   Collection Time: 04/23/17 12:20 PM  Result Value Ref Range Status   Specimen Description BLOOD RIGHT HAND  Final   Special Requests IN PEDIATRIC BOTTLE Blood Culture adequate volume  Final   Culture   Final    NO GROWTH < 24 HOURS Performed at [facility], [place] [address], [place], [place] [address]    Report Status PENDING  Incomplete      Radiology Studies: Dg Chest Port 1 View  Result Date: 04/23/2017 CLINICAL DATA:  Cough and hypoxia EXAM: PORTABLE CHEST 1 VIEW COMPARISON:  PET CT 04/07/2017.  Chest x-ray 02/21/2017. FINDINGS: Interstitial coarsening that is chronic. Mild atelectatic change or scarring at the right base. No definite consolidation.  Cardiomegaly. Dual-chamber pacer leads from the left. Known left upper lobe pulmonary nodule that is occult. IMPRESSION: COPD.  No acute finding when compared to prior. Electronically Signed   By: Monte Fantasia M.D.   On: 04/23/2017 09:39    Scheduled Meds: . apixaban  5 mg Oral BID  . escitalopram  10 mg Oral Daily  . insulin aspart  0-15 Units Subcutaneous TID WC  . insulin aspart  0-5 Units Subcutaneous QHS  . ipratropium-albuterol  3 mL Nebulization QID  . LORazepam  0.5 mg Oral BID  . oseltamivir  30 mg Oral BID  . predniSONE  50 mg Oral Q breakfast  . senna-docusate  1 tablet Oral BID   Continuous Infusions: . sodium chloride 1,000 mL (04/24/17 0638)     LOS: 0 days   Time spent: 25 minutes.  Vance Gather, MD [facility] Pager [phone]  If 7PM-7AM, please contact night-coverage [email or web address] Password [facility] 04/24/2017, 4:54 PM

## 2017-04-25 LAB — BASIC METABOLIC PANEL
Anion gap: 12 (ref 5–15)
BUN: 19 mg/dL (ref 6–20)
CHLORIDE: 106 mmol/L (ref 101–111)
CO2: 24 mmol/L (ref 22–32)
CREATININE: 1.18 mg/dL — AB (ref 0.44–1.00)
Calcium: 9 mg/dL (ref 8.9–10.3)
GFR calc Af Amer: 48 mL/min — ABNORMAL LOW (ref >60)
GFR calc non Af Amer: 41 mL/min — ABNORMAL LOW (ref >60)
Glucose, Bld: 155 mg/dL — ABNORMAL HIGH (ref 65–99)
Potassium: 3.6 mmol/L (ref 3.5–5.1)
Sodium: 142 mmol/L (ref 135–145)

## 2017-04-25 LAB — GLUCOSE, CAPILLARY
Glucose-Capillary: 107 mg/dL — ABNORMAL HIGH (ref 65–99)
Glucose-Capillary: 203 mg/dL — ABNORMAL HIGH (ref 65–99)
Glucose-Capillary: 263 mg/dL — ABNORMAL HIGH (ref 65–99)
Glucose-Capillary: 323 mg/dL — ABNORMAL HIGH (ref 65–99)

## 2017-04-25 MED ORDER — IPRATROPIUM-ALBUTEROL 0.5-2.5 (3) MG/3ML IN SOLN
3.0000 mL | Freq: Three times a day (TID) | RESPIRATORY_TRACT | Status: DC
  Administered 2017-04-25 – 2017-04-29 (×12): 3 mL via RESPIRATORY_TRACT
  Filled 2017-04-25 (×12): qty 3

## 2017-04-25 NOTE — Evaluation (Signed)
Physical Therapy Evaluation Patient Details Name: [name] MRN: [id number] DOB: 04/12/1931 Today's Date: 04/25/2017   History of Present Illness  [name] is an 84 y.o. female with a history of COPD nonadherent to home oxygen, AFib on eliquis s/p PPM, recurrent aspiration PNA, T2DM, and recently diagnosed lung cancer without tissue Dx or Tx thus far who presented to the ED from ALF due to fever and hypoxia in the setting of recent increasing dyspnea and increased sputum production and wheezing in the ED. Influenza swab was positive in the ED without infiltrate on CXR. Nebulizers, steroids, and tamiflu were started and the patient admitted.   Clinical Impression   Pt admitted with above diagnosis. Pt currently with functional limitations due to the deficits listed below (see PT Problem List). Near independent, walking with rollator RW prior to admission; present with decr functional capacity; O2 sats decr to 86% with activity on 2 L supplemental O2; incr to 3L and notified RN;  Pt will benefit from skilled PT to increase their independence and safety with mobility to allow discharge to the venue listed below.       Follow Up Recommendations Home health PT;Supervision - Intermittent(HHPT at ALF)    Equipment Recommendations  None recommended by PT(pretty well-equipped)    Recommendations for Other Services       Precautions / Restrictions Precautions Precautions: Fall Precaution Comments: watch O2 sats; desatted on supplemental O2 2 L Restrictions Weight Bearing Restrictions: No      Mobility  Bed Mobility Overal bed mobility: Needs Assistance Bed Mobility: Supine to Sit;Sit to Supine     Supine to sit: Min guard Sit to supine: Min guard   General bed mobility comments: Cues to self-monitor for activity tolerance  Transfers Overall transfer level: Needs assistance Equipment used: 1 person hand held assist Transfers: Sit to/from Stand Sit to Stand: Min guard          General transfer comment: Dependent on UEs for push; held to bedrail  Ambulation/Gait Ambulation/Gait assistance: Min guard Ambulation Distance (Feet): (March [facility] at [facility]) Assistive device: 1 person hand held assist       General Gait Details: low step height; limited amb due to noted dyspnea with activity along with desat to 86% on 2L supplemental O2  Stairs            Wheelchair Mobility    Modified Rankin (Stroke Patients Only)       Balance Overall balance assessment: Needs assistance           Standing balance-Leahy Scale: Fair Standing balance comment: Benefits from UE support                             Pertinent Vitals/Pain Pain Assessment: No/denies pain    Home Living Family/patient expects to be discharged to:: Assisted living([facility])     Type of Home: Assisted living         Home Equipment: Gilford Rile - 2 wheels;Walker - 4 wheels;Cane - single point;Bedside commode;Shower seat      Prior Function Level of Independence: Needs assistance   Gait / Transfers Assistance Needed: walks with Rollator RW to and from the dining room  ADL's / Homemaking Assistance Needed: gets assist with ADls/IADLs from a nurse who comes daily. Does not drive.        Hand Dominance   Dominant Hand: Right    Extremity/Trunk Assessment   Upper  Extremity Assessment Upper Extremity Assessment: Generalized weakness    Lower Extremity Assessment Lower Extremity Assessment: Generalized weakness       Communication   Communication: No difficulties  Cognition Arousal/Alertness: Awake/alert Behavior During Therapy: WFL for tasks assessed/performed Overall Cognitive Status: No family/caregiver present to determine baseline cognitive functioning                                        General Comments      Exercises     Assessment/Plan    PT Assessment Patient needs continued PT services  PT  Problem List Decreased strength;Decreased range of motion;Decreased activity tolerance;Decreased balance;Decreased mobility;Decreased knowledge of use of DME;Decreased knowledge of precautions;Cardiopulmonary status limiting activity       PT Treatment Interventions DME instruction;Gait training;Stair training;Functional mobility training;Therapeutic activities;Therapeutic exercise;Balance training;Patient/family education    PT Goals (Current goals can be found in the Care Plan section)  Acute Rehab PT Goals Patient Stated Goal: get better PT Goal Formulation: With patient Time For Goal Achievement: 05/09/17 Potential to Achieve Goals: Good    Frequency Min 3X/week   Barriers to discharge        Co-evaluation               AM-PAC PT "6 Clicks" Daily Activity  Outcome Measure Difficulty turning over in bed (including adjusting bedclothes, sheets and blankets)?: A Little Difficulty moving from lying on back to sitting on the side of the bed? : A Little Difficulty sitting down on and standing up from a chair with arms (e.g., wheelchair, bedside commode, etc,.)?: Unable Help needed moving to and from a bed to chair (including a wheelchair)?: A Little Help needed walking in hospital room?: A Little Help needed climbing 3-5 steps with a railing? : A Lot 6 Click Score: 15    End of Session Equipment Utilized During Treatment: Oxygen;Gait belt Activity Tolerance: Other (comment)(Limited by DOE) Patient left: in bed;with call bell/phone within reach;with bed alarm set Nurse Communication: Mobility status PT Visit Diagnosis: Other abnormalities of gait and mobility (R26.89);Other (comment)(Decr fucntional capacity)    Time: [id number] PT Time Calculation (min) (ACUTE ONLY): 19 min   Charges:   PT Evaluation $PT Eval Low Complexity: 1 Low     PT G Codes:        Roney Marion, PT  Acute Rehabilitation Services Pager [phone] Office [place] 04/25/2017, 5:09 PM

## 2017-04-25 NOTE — Discharge Instructions (Signed)

## 2017-04-25 NOTE — Progress Notes (Signed)
PROGRESS NOTE  [name]  SWF:[id number] DOB: [birth year] DOA: 04/23/2017 PCP: Hoyt Koch, MD  Outpatient Specialists: Vaughan Browner, pulmonology Brief Narrative: [name] is an 84 y.o. female with a history of COPD nonadherent to home oxygen, AFib on eliquis s/p PPM, recurrent aspiration PNA, T2DM, and recently diagnosed lung cancer without tissue Dx or Tx thus far who presented to the ED from ALF due to fever and hypoxia in the setting of recent increasing dyspnea and increased sputum production and wheezing in the ED. Influenza swab was positive in the ED without infiltrate on CXR. Nebulizers, steroids, and tamiflu were started and the patient admitted.   Assessment & Plan: Principal Problem:   Acute on chronic respiratory failure with hypoxia (HCC) Active Problems:   Controlled diabetes mellitus type 2 with complications (HCC)   Obstructive sleep apnea   [name] (coronary artery disease)   Atrial fibrillation (HCC)   PACEMAKER, PERMANENT   Gold stage C. COPD with frequent exacerbations   Diastolic CHF, chronic (HCC)   GERD (gastroesophageal reflux disease)   Primary cancer of left upper lobe of lung (HCC)   Cough   Fever   Influenza A   CKD (chronic kidney disease) stage 3, GFR 30-59 ml/min (HCC)  Acute on chronic hypoxic respiratory failure: Due to influenza and COPD exacerbation in patient with limited pulmonary reserve due to severe COPD, recurrent aspiration PNA, OSA not compliant with CPAP, and likely bronchogenic carcinoma. - Continue supplemental oxygen and treat conditions as below. - SLP has previously recommended dysphagia diet which the patient has declined. Will not reorder evaluation.   GOLD D COPD with exacerbation due to influenza A: PFTs recently showed moderate COPD with severe reduction in DLCO - Bronchodilators scheduled and prn - Tamiflu x5 days - Continue prednisone burst (had recently been decreased to 10mg  daily) - Flutter valve - Hold abx  with negative CXR for now.  - Dr. Vaughan Browner has paid a social visit 2/17.  PET + left upper lobe pulmonary nodule: Strongly suggestive of bronchogenic CA in former heavy smoker: Pt and son discussed with pulmonology, Dr. Vaughan Browner as outpatient. He felt risks were prohibitive of bronchoscopic biopsy, CT-guided biopsy, and resection due to severity of comorbidities/frailty. Radiation therapy was considered.  - Planning to discuss with oncology, Dr. Jana Hakim at appointment on 2/19. I've added Dr. Jana Hakim to treatment team and will discuss with him 2/18.  - Would consider a palliative care consult following discussion with oncology.   Chronic kidney disease stage III. Appears to be stable at baseline. - Hold nephrotoxins (DC toradol) - Monitor  Chronic systolic heart failure, ischemic cardiomyopathy: BNP 275. - DC IVF's, continue home medications - Strict I/O, daily weights  AFib s/p failed ablations now s/p PPM: CHA2DS2-VASc is 4.  - Continue home medications  T2DM: HbA1c 7.7%, exacerbated by ongoing steroid use. - Hold home medications: Glipizide, metformin - SSI  Depression, anxiety, memory impairment:  - Continue SSRI, benzodiazepine, namenda home meds  DVT prophylaxis: Eliquis Code Status: DNR Family Communication: None at bedside this AM. Disposition Plan: Patient resolved to return to ALF, PT evaluation ordered to gauge the safety of this. May be stable for discharge in next 24 hours.  Consultants: Oncology Procedures: None Antimicrobials: Tamiflu   Subjective: Dyspnea worse than baseline continues. She reports some improvement in breathing since yesterday, but extremely limited in exertion. No chest pain. Still with increased sputum/cough and some wheezing.    Objective: Vitals:   04/24/17 2015 04/24/17 2115 04/25/17 2202  04/25/17 0743  BP: (!) 92/46  (!) 133/95   Pulse: 75  79   Resp: (!) 22  (!) 22   Temp: 98.8 F (37.1 C)  97.7 F (36.5 C)   TempSrc: Oral  Oral     SpO2: 96% 94% 97% 97%  Weight:   78.5 kg (173 lb 1 oz)   Height:        Intake/Output Summary (Last 24 hours) at 04/25/2017 1509 Last data filed at 04/25/2017 1215 Gross per 24 hour  Intake 120 ml  Output 1900 ml  Net -1780 ml   Filed Weights   04/23/17 1850 04/24/17 1836 04/25/17 0625  Weight: 78.6 kg (173 lb 4.5 oz) 80.9 kg (178 lb 6.4 oz) 78.5 kg (173 lb 1 oz)    Gen: Frail elderly female in no distress sitting in chair beside bed. Pulm: Tachypneic despite supplemental oxygen at rest. Coarse bilateral breath sounds without crackles or wheezing.  CV: Regular rate and rhythm. No murmur, rub, or gallop. No JVD, trace pedal edema. GI: Abdomen soft, non-tender, non-distended, with normoactive bowel sounds. No organomegaly or masses felt. Ext: Warm, no deformities Skin: No rashes, lesions or ulcers Neuro: Alert and oriented. No focal neurological deficits. Psych: Judgement and insight appear fair. Mood & affect appropriate.   Data Reviewed: I have personally reviewed following labs and imaging studies  CBC: Recent Labs  Lab 04/23/17 0929 04/24/17 0339  WBC 9.5 7.6  NEUTROABS 6.8  --   HGB 13.2 12.6  HCT 41.3 40.3  MCV 92.8 93.5  PLT 181 622   Basic Metabolic Panel: Recent Labs  Lab 04/23/17 0929 04/24/17 0339 04/25/17 0352  NA 141 140 142  K 4.1 4.6 3.6  CL 105 106 106  CO2 25 24 24   GLUCOSE 106* 197* 155*  BUN 12 14 19   CREATININE 1.21* 1.10* 1.18*  CALCIUM 8.5* 8.9 9.0   GFR: Estimated Creatinine Clearance: 37.4 mL/min (A) (by C-G formula based on SCr of 1.18 mg/dL (H)). Liver Function Tests: Recent Labs  Lab 04/23/17 0929  AST 20  ALT 14  ALKPHOS 54  BILITOT 0.8  PROT 6.3*  ALBUMIN 2.7*   Recent Labs  Lab 04/23/17 0929  LIPASE 26   No results for input(s): AMMONIA in the last 168 hours. Coagulation Profile: No results for input(s): INR, PROTIME in the last 168 hours. Cardiac Enzymes: No results for input(s): CKTOTAL, CKMB, CKMBINDEX,  TROPONINI in the last 168 hours. BNP (last 3 results) No results for input(s): PROBNP in the last 8760 hours. HbA1C: Recent Labs    04/23/17 1347  HGBA1C 7.7*   CBG: Recent Labs  Lab 04/24/17 1201 04/24/17 1701 04/24/17 2010 04/25/17 0855 04/25/17 1144  GLUCAP 137* 306* 240* 107* 203*   Lipid Profile: No results for input(s): CHOL, HDL, LDLCALC, TRIG, CHOLHDL, LDLDIRECT in the last 72 hours. Thyroid Function Tests: No results for input(s): TSH, T4TOTAL, FREET4, T3FREE, THYROIDAB in the last 72 hours. Anemia Panel: No results for input(s): VITAMINB12, FOLATE, FERRITIN, TIBC, IRON, RETICCTPCT in the last 72 hours. Urine analysis:    Component Value Date/Time   COLORURINE YELLOW 04/24/2017 [place] 04/24/2017 0721   LABSPEC 1.013 04/24/2017 0721   PHURINE 5.0 04/24/2017 0721   GLUCOSEU NEGATIVE 04/24/2017 0721   HGBUR NEGATIVE 04/24/2017 0721   BILIRUBINUR NEGATIVE 04/24/2017 0721   KETONESUR NEGATIVE 04/24/2017 0721   PROTEINUR NEGATIVE 04/24/2017 0721   UROBILINOGEN 0.2 10/24/2014 1433   NITRITE NEGATIVE 04/24/2017 2979  LEUKOCYTESUR NEGATIVE 04/24/2017 5974   Recent Results (from the past 240 hour(s))  Blood Culture (routine x 2)     Status: None (Preliminary result)   Collection Time: 04/23/17  9:36 AM  Result Value Ref Range Status   Specimen Description BLOOD LEFT ANTECUBITAL  Final   Special Requests   Final    BOTTLES DRAWN AEROBIC AND ANAEROBIC Blood Culture adequate volume   Culture   Final    NO GROWTH 2 DAYS Performed at [facility], [address], [place], [place] [address]    Report Status PENDING  Incomplete  Blood Culture (routine x 2)     Status: None (Preliminary result)   Collection Time: 04/23/17 12:20 PM  Result Value Ref Range Status   Specimen Description BLOOD RIGHT HAND  Final   Special Requests IN PEDIATRIC BOTTLE Blood Culture adequate volume  Final   Culture   Final    NO GROWTH 2 DAYS Performed at [facility], [place] [address] [place], [place] [address]    Report Status PENDING  Incomplete      Radiology Studies: No results found.  Scheduled Meds: . apixaban  5 mg Oral BID  . atorvastatin  20 mg Oral Q2000  . escitalopram  10 mg Oral Daily  . furosemide  40 mg Oral BID  . insulin aspart  0-15 Units Subcutaneous TID WC  . insulin aspart  0-5 Units Subcutaneous QHS  . ipratropium-albuterol  3 mL Nebulization TID  . LORazepam  0.5 mg Oral BID  . memantine  28 mg Oral Daily  . oseltamivir  30 mg Oral BID  . predniSONE  50 mg Oral Q breakfast  . senna-docusate  1 tablet Oral BID   Continuous Infusions:    LOS: 1 day   Time spent: 25 minutes.  Vance Gather, MD [facility] Pager [phone]  If 7PM-7AM, please contact night-coverage [email or web address] Password [facility] 04/25/2017, 3:09 PM

## 2017-04-26 ENCOUNTER — Telehealth: Payer: Self-pay | Admitting: Oncology

## 2017-04-26 ENCOUNTER — Inpatient Hospital Stay (HOSPITAL_COMMUNITY): Payer: Medicare Other

## 2017-04-26 DIAGNOSIS — R69 Illness, unspecified: Secondary | ICD-10-CM

## 2017-04-26 DIAGNOSIS — K219 Gastro-esophageal reflux disease without esophagitis: Secondary | ICD-10-CM

## 2017-04-26 DIAGNOSIS — J9621 Acute and chronic respiratory failure with hypoxia: Secondary | ICD-10-CM

## 2017-04-26 DIAGNOSIS — G4733 Obstructive sleep apnea (adult) (pediatric): Secondary | ICD-10-CM

## 2017-04-26 DIAGNOSIS — J690 Pneumonitis due to inhalation of food and vomit: Secondary | ICD-10-CM

## 2017-04-26 LAB — BLOOD GAS, ARTERIAL
Acid-Base Excess: 7.2 mmol/L — ABNORMAL HIGH (ref 0.0–2.0)
BICARBONATE: 30.5 mmol/L — AB (ref 20.0–28.0)
DRAWN BY: 274071
O2 Content: 3 L/min
O2 Saturation: 84.2 %
PCO2 ART: 37.8 mmHg (ref 32.0–48.0)
PH ART: 7.518 — AB (ref 7.350–7.450)
Patient temperature: 98.6
pO2, Arterial: 48 mmHg — ABNORMAL LOW (ref 83.0–108.0)

## 2017-04-26 LAB — GLUCOSE, CAPILLARY
Glucose-Capillary: 82 mg/dL (ref 65–99)
Glucose-Capillary: 210 mg/dL — ABNORMAL HIGH (ref 65–99)
Glucose-Capillary: 369 mg/dL — ABNORMAL HIGH (ref 65–99)
Glucose-Capillary: 239 mg/dL — ABNORMAL HIGH (ref 65–99)

## 2017-04-26 MED ORDER — INSULIN ASPART 100 UNIT/ML ~~LOC~~ SOLN
3.0000 [IU] | Freq: Three times a day (TID) | SUBCUTANEOUS | Status: DC
  Administered 2017-04-26 – 2017-04-29 (×9): 3 [IU] via SUBCUTANEOUS

## 2017-04-26 MED ORDER — GUAIFENESIN ER 600 MG PO TB12
1200.0000 mg | ORAL_TABLET | Freq: Two times a day (BID) | ORAL | Status: DC
  Administered 2017-04-26 – 2017-04-29 (×7): 1200 mg via ORAL
  Filled 2017-04-26 (×7): qty 2

## 2017-04-26 MED ORDER — WHITE PETROLATUM EX OINT
TOPICAL_OINTMENT | CUTANEOUS | Status: AC
  Administered 2017-04-26: 14:00:00
  Filled 2017-04-26: qty 28.35

## 2017-04-26 MED ORDER — AMPICILLIN-SULBACTAM SODIUM 1.5 (1-0.5) G IJ SOLR
1.5000 g | Freq: Four times a day (QID) | INTRAMUSCULAR | Status: DC
  Administered 2017-04-26 – 2017-04-28 (×9): 1.5 g via INTRAVENOUS
  Filled 2017-04-26 (×11): qty 1.5

## 2017-04-26 MED ORDER — MORPHINE SULFATE (PF) 4 MG/ML IV SOLN: 2.0000 mg | INTRAVENOUS | Status: DC | PRN

## 2017-04-26 MED ORDER — METHYLPREDNISOLONE SODIUM SUCC 40 MG IJ SOLR
40.0000 mg | Freq: Two times a day (BID) | INTRAMUSCULAR | Status: DC
  Administered 2017-04-26 – 2017-04-27 (×2): 40 mg via INTRAVENOUS
  Filled 2017-04-26 (×2): qty 1

## 2017-04-26 NOTE — Progress Notes (Signed)
PROGRESS NOTE  [name]  NGE:[id number] DOB: 25-Feb-1932 DOA: 04/23/2017 PCP: Hoyt Koch, MD  Outpatient Specialists: Vaughan Browner, pulmonology Brief Narrative: [name] is an 84 y.o. female with a history of COPD nonadherent to home oxygen, AFib on eliquis s/p PPM, recurrent aspiration PNA, T2DM, and recently diagnosed lung cancer without tissue Dx or Tx thus far who presented to the ED from ALF due to fever and hypoxia in the setting of recent increasing dyspnea and increased sputum production and wheezing in the ED. Influenza swab was positive in the ED without infiltrate on CXR. Nebulizers, steroids, and tamiflu were started and the patient admitted.   Assessment & Plan: Principal Problem:   Acute on chronic respiratory failure with hypoxia (HCC) Active Problems:   Controlled diabetes mellitus type 2 with complications (HCC)   Obstructive sleep apnea   [name] (coronary artery disease)   Atrial fibrillation (HCC)   PACEMAKER, PERMANENT   Gold stage C. COPD with frequent exacerbations   Diastolic CHF, chronic (HCC)   GERD (gastroesophageal reflux disease)   Primary cancer of left upper lobe of lung (HCC)   Cough   Fever   Influenza A   CKD (chronic kidney disease) stage 3, GFR 30-59 ml/min (HCC)  Acute on chronic hypoxic respiratory failure: Due to influenza and COPD exacerbation in patient with limited pulmonary reserve due to severe COPD, recurrent aspiration PNA, OSA not compliant with CPAP, and likely bronchogenic carcinoma. - Continue supplemental oxygen and treat conditions as below. - SLP has previously recommended dysphagia diet which the patient has declined. Will not reorder evaluation.  - Abrupt decline 2/18 after initial improvement. I'm most concerned for recurrent aspiration. Checking CXR, ABG, and will cover with unasyn - Dr. Vaughan Browner has paid a social visit 2/17. With worsening overall on 2/18, I've asked for a formal consultation. I appreciate their  assistance.  GOLD D COPD with exacerbation due to influenza A: PFTs recently showed moderate COPD with severe reduction in DLCO - Bronchodilators scheduled and prn - Tamiflu x5 days - Continue prednisone burst (had recently been decreased to 10mg  daily) - Flutter valve  PET + left upper lobe pulmonary nodule: Strongly suggestive of bronchogenic CA in former heavy smoker: Pt and son discussed with pulmonology, Dr. Vaughan Browner as outpatient. He felt risks were prohibitive of bronchoscopic biopsy, CT-guided biopsy, and resection due to severity of comorbidities/frailty. Radiation therapy was considered.  - Discussed with Dr. Jana Hakim. He had agreed to see the patient while in the hospital since she would miss the appointment 2/19, though with her current status, she would not be a candidate for any therapy including XRT. He will reschedule the appointment for about 6 weeks.  - Will consult palliative care.  Chronic kidney disease stage III. Appears to be stable at baseline. - Hold nephrotoxins (DC toradol) - Monitor  Chronic systolic heart failure, ischemic cardiomyopathy: BNP 275. - Continue home medications including lasix 40mg  po BID, weights stable. - Strict I/O, daily weights  AFib s/p failed ablations now s/p PPM: CHA2DS2-VASc is 4.  - Continue home medications  T2DM: HbA1c 7.7%, exacerbated by ongoing steroid use. - Hold home medications: Glipizide, metformin - SSI  Depression, anxiety, memory impairment:  - Continue SSRI, benzodiazepine, namenda home meds  DVT prophylaxis: Eliquis Code Status: DNR Family Communication: Discussed with pt's daughter-in-law by phone.  Disposition Plan: Plan currently to return to ALF per PT evaluation. Will depend on further clinical progress.   Consultants: Pulmonology, palliative care; discussed with oncology Procedures:  None Antimicrobials: Tamiflu 2/15 - 2/20; Unasyn 2/18 >>   Subjective: Feels abruptly much worse today. Having a hard time  catching breath even at rest, feels wheezing is worse, requiring more O2 due to desaturations. Denies a known aspiration event.   Objective: Vitals:   04/25/17 1652 04/26/17 0612 04/26/17 0822 04/26/17 0830  BP: 121/76 96/82    Pulse: 76 77    Resp:      Temp: 98 F (36.7 C) (!) 97.5 F (36.4 C)    TempSrc: Oral Oral    SpO2:  90% (!) 84% 94%  Weight:      Height:        Intake/Output Summary (Last 24 hours) at 04/26/2017 0911 Last data filed at 04/26/2017 9381 Gross per 24 hour  Intake 240 ml  Output 1800 ml  Net -1560 ml   Filed Weights   04/24/17 1836 04/25/17 0625  Weight: 80.9 kg (178 lb 6.4 oz) 78.5 kg (173 lb 1 oz)    Gen: Frail elderly female laying in bed Pulm: Tachypneic and labored with any exertion. Coarse bilaterally.  CV: Regular rate and rhythm. No murmur, rub, or gallop. No JVD, trace pedal edema. GI: Abdomen soft, non-tender, non-distended, with normoactive bowel sounds. No organomegaly or masses felt. Ext: Warm, no deformities Skin: No rashes, lesions or ulcers Neuro: Alert and oriented. No focal neurological deficits. Psych: Judgement and insight appear fair. Mood & affect appropriate.   CBC: Recent Labs  Lab 04/23/17 0929 04/24/17 0339  WBC 9.5 7.6  NEUTROABS 6.8  --   HGB 13.2 12.6  HCT 41.3 40.3  MCV 92.8 93.5  PLT 181 829   Basic Metabolic Panel: Recent Labs  Lab 04/23/17 0929 04/24/17 0339 04/25/17 0352  NA 141 140 142  K 4.1 4.6 3.6  CL 105 106 106  CO2 25 24 24   GLUCOSE 106* 197* 155*  BUN 12 14 19   CREATININE 1.21* 1.10* 1.18*  CALCIUM 8.5* 8.9 9.0   GFR: Estimated Creatinine Clearance: 37.4 mL/min (A) (by C-G formula based on SCr of 1.18 mg/dL (H)). Liver Function Tests: Recent Labs  Lab 04/23/17 0929  AST 20  ALT 14  ALKPHOS 54  BILITOT 0.8  PROT 6.3*  ALBUMIN 2.7*   Recent Labs  Lab 04/23/17 0929  LIPASE 26   HbA1C: Recent Labs    04/23/17 1347  HGBA1C 7.7*   CBG: Recent Labs  Lab 04/25/17 0855  04/25/17 1144 04/25/17 1713 04/25/17 1956 04/26/17 0741  GLUCAP 107* 203* 263* 323* 82   Urine analysis:    Component Value Date/Time   COLORURINE YELLOW 04/24/2017 0721   APPEARANCEUR CLEAR 04/24/2017 0721   LABSPEC 1.013 04/24/2017 0721   PHURINE 5.0 04/24/2017 0721   GLUCOSEU NEGATIVE 04/24/2017 0721   HGBUR NEGATIVE 04/24/2017 0721   BILIRUBINUR NEGATIVE 04/24/2017 0721   KETONESUR NEGATIVE 04/24/2017 0721   PROTEINUR NEGATIVE 04/24/2017 0721   UROBILINOGEN 0.2 10/24/2014 1433   NITRITE NEGATIVE 04/24/2017 0721   LEUKOCYTESUR NEGATIVE 04/24/2017 0721   Recent Results (from the past 240 hour(s))  Blood Culture (routine x 2)     Status: None (Preliminary result)   Collection Time: 04/23/17  9:36 AM  Result Value Ref Range Status   Specimen Description BLOOD LEFT ANTECUBITAL  Final   Special Requests   Final    BOTTLES DRAWN AEROBIC AND ANAEROBIC Blood Culture adequate volume   Culture   Final    NO GROWTH 2 DAYS Performed at [facility], [address], [place], [place] [address]    Report Status PENDING  Incomplete  Blood Culture (routine x 2)     Status: None (Preliminary result)   Collection Time: 04/23/17 12:20 PM  Result Value Ref Range Status   Specimen Description BLOOD RIGHT HAND  Final   Special Requests IN PEDIATRIC BOTTLE Blood Culture adequate volume  Final   Culture   Final    NO GROWTH 2 DAYS Performed at [facility], [place] [address] [place], [place] [address]    Report Status PENDING  Incomplete      Radiology Studies: No results found.  Scheduled Meds: . apixaban  5 mg Oral BID  . atorvastatin  20 mg Oral Q2000  . escitalopram  10 mg Oral Daily  . furosemide  40 mg Oral BID  . insulin aspart  0-15 Units Subcutaneous TID WC  . insulin aspart  0-5 Units Subcutaneous QHS  . ipratropium-albuterol  3 mL Nebulization TID  . LORazepam  0.5 mg Oral BID  . memantine  28 mg Oral Daily  . oseltamivir  30 mg Oral BID  . senna-docusate   1 tablet Oral BID   Continuous Infusions:    LOS: 2 days   Time spent: 25 minutes.  Tiffany Gather, MD [facility] Pager [phone]  If 7PM-7AM, please contact night-coverage [email or web address] Password [facility] 04/26/2017, 9:11 AM

## 2017-04-26 NOTE — Progress Notes (Signed)
Inpatient Diabetes Program Recommendations  AACE/ADA: New Consensus Statement on Inpatient Glycemic Control (2015)  Target Ranges:  Prepandial:   less than 140 mg/dL      Peak postprandial:   less than 180 mg/dL (1-2 hours)      Critically ill patients:  140 - 180 mg/dL   Lab Results  Component Value Date   GLUCAP 210 (H) 04/26/2017   HGBA1C 7.7 (H) 04/23/2017    Review of Glycemic ControlResults for MILAGRO, BELMARES (MRN [id number]) as of 04/26/2017 14:28  Ref. Range 04/25/2017 08:55 04/25/2017 11:44 04/25/2017 17:13 04/25/2017 19:56 04/26/2017 07:41 04/26/2017 12:40  Glucose-Capillary Latest Ref Range: 65 - 99 mg/dL 107 (H) 203 (H) 263 (H) 323 (H) 82 210 (H)    Diabetes history: Type 2 DM Outpatient Diabetes medications: Glipizide 5 mg daily, Metformin 500 mg daily Current orders for Inpatient glycemic control:  Novolog moderate tid with meals and HS  Inpatient Diabetes Program Recommendations:    If appropriate, consider adding Novolog meal coverage 3 units tid with meals (hold if patient eats less than 50%).   Thanks  Adah Perl, RN, BC-ADM Inpatient Diabetes Coordinator Pager [phone] (8a-5p)

## 2017-04-26 NOTE — Progress Notes (Signed)
Pharmacy Antibiotic Note  [name] is a 84 y.o. female admitted on 04/23/2017 with fever and cough.  Patient was positive for influenza A and started on Tamiflu.  With worsening status, Pharmacy has been consulted for Unasyn dosing for aspiration PNA.  Renal function relatively stable.  Afebrile, WBC WNL.   Plan: Unasyn 1.5gm IV Q6H Pharmacy will sign off and follow peripherally.  Thank you for the consult!   Height: 5\' 5"  (165.1 cm) Weight: 173 lb 1 oz (78.5 kg) IBW/kg (Calculated) : 57  Temp (24hrs), Avg:97.8 F (36.6 C), Min:97.5 F (36.4 C), Max:98 F (36.7 C)  Recent Labs  Lab 04/23/17 0929 04/23/17 0954 04/23/17 1226 04/24/17 0339 04/25/17 0352  WBC 9.5  --   --  7.6  --   CREATININE 1.21*  --   --  1.10* 1.18*  LATICACIDVEN  --  1.17 1.06  --   --     Estimated Creatinine Clearance: 37.4 mL/min (A) (by C-G formula based on SCr of 1.18 mg/dL (H)).    Allergies  Allergen Reactions  . Cholestatin Other (See Comments)    RAGWEED SEASON...sneezing   . Sulfur Itching    Tamiflu 2/15 >> 2/19 Unasyn 2/18 >>  2/15  Flu A - positive 2/15 BCx - NGTD   Leman Martinek D. Mina Marble, PharmD, BCPS Pager:  [phone] 04/26/2017, 9:33 AM

## 2017-04-26 NOTE — Telephone Encounter (Signed)
Called patient regarding 4/1

## 2017-04-26 NOTE — NC FL2 (Addendum)
[place] MEDICAID FL2 LEVEL OF CARE SCREENING TOOL     IDENTIFICATION  Patient Name: [name] Birthdate: May 27, 1931 Sex: female Admission Date (Current Location): 04/23/2017  [facility] and [place] Number:  Herbalist and Address:  The . [facility], [place] [address] [place], [place] [address]      Provider Number: [id number]  Attending Physician Name and Address:  Patrecia Pour, MD  Relative Name and Phone Number:   Dorraine Ellender [phone]    Current Level of Care: Hospital Recommended Level of Care: Assisted Living Facility([facility]) Prior Approval Number:    Date Approved/Denied:   PASRR Number:    Discharge Plan: Other (Comment)([facility])    Current Diagnoses: Patient Active Problem List   Diagnosis Date Noted  . Cough 04/23/2017  . Fever 04/23/2017  . Influenza A 04/23/2017  . Influenza 04/23/2017  . CKD (chronic kidney disease) stage 3, GFR 30-59 ml/min (HCC)   . Primary cancer of left upper lobe of lung ([place]) 04/12/2017  . Recurrent pneumonia 03/05/2017  . Pulmonary nodule 12/11/2016  . Ascending aortic aneurysm ([place]) 12/11/2016  . Agitation   . Acute on chronic respiratory failure with hypoxia ([place]) 11/28/2016  . HCAP (healthcare-associated pneumonia) 11/28/2016  . Acute renal failure superimposed on stage 3 chronic kidney disease ([place]) 11/28/2016  . Gait instability 06/22/2016  . Chronic respiratory failure ([place]) 05/15/2016  . Rotator cuff tear arthropathy of both shoulders 12/09/2015  . Complete heart block ([place]) 02/13/2015  . Acute blood loss anemia 01/28/2015  . Constipation 11/07/2014  . OA (osteoarthritis) of hip 10/31/2014  . Osteoporosis 01/10/2014  . Nicotine dependence in remission 06/28/2013  . GERD (gastroesophageal reflux disease) 07/19/2012  . Depression 06/06/2012  . Secondary hyperparathyroidism ([place]) 04/25/2012  . Gold stage C. COPD with frequent exacerbations 12/15/2011  . Diastolic  CHF, chronic ([place]) 12/15/2011  . Goals of care, counseling/discussion 08/27/2011  . History of stroke 02/21/2010  . PACEMAKER, PERMANENT 02/21/2010  . Malignant neoplasm of upper-inner quadrant of left breast in female, estrogen receptor positive ([place]) 02/03/2010  . Allergic rhinitis 12/31/2009  . Carotid stenosis 08/17/2008  . Controlled diabetes mellitus type 2 with complications ([place]) 00/92/3300  . Obstructive sleep apnea 06/01/2008  . Atrial fibrillation ([place]) 06/01/2008  . [name] 06/01/2008  . Hyperlipidemia 12/30/2006  . [name] (coronary artery disease) 12/30/2006    Orientation RESPIRATION BLADDER Height & Weight     Self, Time, Situation, Place  Normal Continent, External catheter Weight: 173 lb 1 oz (78.5 kg) Height:  5\' 5"  (165.1 cm)  BEHAVIORAL SYMPTOMS/MOOD NEUROLOGICAL BOWEL NUTRITION STATUS      Continent Diet(see discharge summary)-regular diet or no added salt  AMBULATORY STATUS COMMUNICATION OF NEEDS Skin   Supervision Verbally Normal                       Personal Care Assistance Level of Assistance  Bathing, Feeding, Dressing Bathing Assistance: Limited assistance Feeding assistance: Independent Dressing Assistance: Limited assistance     Functional Limitations Info  Sight, Hearing, Speech Sight Info: Adequate Hearing Info: Adequate Speech Info: Adequate    SPECIAL CARE FACTORS FREQUENCY  PT (By licensed PT)     PT Frequency: 3x week              Contractures Contractures Info: Not present    Additional Factors Info  Code Status, Allergies Code Status Info: DNR Allergies Info: Cholestatin, Sulfur  Current Medications (04/26/2017):  This is the current hospital active medication list Current Facility-Administered Medications  Medication Dose Route Frequency Provider Last Rate Last Dose  . acetaminophen (TYLENOL) tablet 650 mg  650 mg Oral Q6H PRN Radene Gunning, NP       Or  . acetaminophen (TYLENOL) suppository 650 mg  650 mg  Rectal Q6H PRN Radene Gunning, NP      . albuterol (PROVENTIL) (2.5 MG/3ML) 0.083% nebulizer solution 2.5 mg  2.5 mg Nebulization Q2H PRN Radene Gunning, NP   2.5 mg at 04/26/17 0210  . ampicillin-sulbactam (UNASYN) 1.5 g in sodium chloride 0.9 % 100 mL IVPB  1.5 g Intravenous Q6H Tyrone Apple, [facility]   Stopped at 04/26/17 1108  . apixaban (ELIQUIS) tablet 5 mg  5 mg Oral BID Radene Gunning, NP   5 mg at 04/26/17 1040  . atorvastatin (LIPITOR) tablet 20 mg  20 mg Oral Q2000 Patrecia Pour, MD   20 mg at 04/25/17 2023  . escitalopram (LEXAPRO) tablet 10 mg  10 mg Oral Daily Radene Gunning, NP   10 mg at 04/26/17 1040  . furosemide (LASIX) tablet 40 mg  40 mg Oral BID Patrecia Pour, MD   40 mg at 04/26/17 0818  . guaiFENesin (MUCINEX) 12 hr tablet 1,200 mg  1,200 mg Oral BID Mannam, Praveen, MD   1,200 mg at 04/26/17 1414  . insulin aspart (novoLOG) injection 0-15 Units  0-15 Units Subcutaneous TID WC Radene Gunning, NP   5 Units at 04/25/17 1743  . insulin aspart (novoLOG) injection 0-5 Units  0-5 Units Subcutaneous QHS Radene Gunning, NP   4 Units at 04/25/17 2023  . insulin aspart (novoLOG) injection 3 Units  3 Units Subcutaneous TID WC Vance Gather B, MD      . ipratropium-albuterol (DUONEB) 0.5-2.5 (3) MG/3ML nebulizer solution 3 mL  3 mL Nebulization TID Patrecia Pour, MD   3 mL at 04/26/17 2297  . LORazepam (ATIVAN) tablet 0.5 mg  0.5 mg Oral BID Dyanne Carrel M, NP   0.5 mg at 04/26/17 1040  . memantine (NAMENDA XR) 24 hr capsule 28 mg  28 mg Oral Daily Patrecia Pour, MD   28 mg at 04/26/17 1039  . methylPREDNISolone sodium succinate (SOLU-MEDROL) 40 mg/mL injection 40 mg  40 mg Intravenous Q12H Mannam, Praveen, MD   40 mg at 04/26/17 1408  . ondansetron (ZOFRAN) tablet 4 mg  4 mg Oral Q6H PRN Radene Gunning, NP       Or  . ondansetron [facility]) injection 4 mg  4 mg Intravenous Q6H PRN Radene Gunning, NP      . oseltamivir (TAMIFLU) capsule 30 mg  30 mg Oral BID Einar Grad, RPH   30 mg at  04/26/17 1040  . senna-docusate (Senokot-S) tablet 1 tablet  1 tablet Oral BID Radene Gunning, NP   1 tablet at 04/26/17 1040     Discharge Medications: Please see discharge summary for a list of discharge medications.  Relevant Imaging Results:  Relevant Lab Results:   Additional Information SS# [id number]; Pt has new PCP-Maggie Masuk NP through [facility] [place], [place]

## 2017-04-26 NOTE — Progress Notes (Signed)
0800 Pt is short of breath this morning, O2 sats 84% at 3L, coarse crackles bil noted. Scheduled Douneb given by RT. O2 sats up to 94% at 3Li. Feeling better.

## 2017-04-26 NOTE — Progress Notes (Signed)
No charge note.  Palliative Medicine meeting scheduled for 10 am on 2/19.  Florentina Jenny, PA-C Palliative Medicine Pager: [phone]

## 2017-04-26 NOTE — Consult Note (Addendum)
PULMONARY / CRITICAL CARE MEDICINE   Name: [name] MRN: [id number] DOB: December 14, 1931    ADMISSION DATE:  04/23/2017 CONSULTATION DATE:  04/26/16  REFERRING MD:  Vance Gather MD  CHIEF COMPLAINT: Dyspnea, wheezing.  HISTORY OF PRESENT ILLNESS:   [name] is well-known to me from clinic with history of COPD GOLD D, frequent exacerbations, ex smoker, Chronic aspiration, left upper lobe pulmonary nodule suspicious for malignancy, OSA  Admitted on 2/15 with dyspnea, wheezing found to be flu positive.  Treated with Tamiflu, prednisone, nebs She had been making good progress but became more acutely short of breath on 2/18.  Chest x-ray is unchanged. PCCM consulted for help with management.  PAST MEDICAL HISTORY :  She  has a past medical history of Acute blood loss anemia (04/20/2012), AF (atrial fibrillation) ([place]), Amiodarone pulmonary toxicity, Ascending aortic aneurysm ([place]) (12/11/2016), Asthma, Bronchitis, [name] (coronary artery disease), CHF (congestive heart failure) ([place]), Chronic renal insufficiency, stage III (moderate) ([facility]), CKD (chronic kidney disease) stage 3, GFR 30-59 ml/min ([place]), Complication of anesthesia, COPD (chronic obstructive pulmonary disease) ([place]), CVA (cerebral vascular accident) ([place]), Depression (06/06/2012), Diabetes ([place]), Diastolic heart failure, Eczema, GERD (gastroesophageal reflux disease), History of skin cancer, cardiovascular stress test, Hyperlipidemia, Invasive ductal carcinoma of breast ([place]) (2011), Mental disorder, OA (osteoarthritis) of knee, Pacemaker, PAD (peripheral artery disease) ([place]), Pain, Pneumonia, Right sided sciatica, Shortness of breath dyspnea, Sinoatrial node dysfunction ([place]), Sleep apnea, Tobacco abuse, and Tremors of nervous system.  PAST SURGICAL HISTORY: She  has a past surgical history that includes Mastectomy, partial (02/03/2010); Cataract extraction, bilateral; Breast lumpectomy; ORIF acetabular fracture (Left, 04/19/2012); Atrial  ablation surgery; Vascular surgery; Cardiac catheterization; Total knee arthroplasty (Right, 10/16/2013); sun spot removed ; Total hip arthroplasty (Left, 10/31/2014); and Cardiac catheterization (N/A, 02/13/2015).  Allergies  Allergen Reactions  . Cholestatin Other (See Comments)    RAGWEED SEASON...sneezing   . Sulfur Itching    No current facility-administered medications on file prior to encounter.    Current Outpatient Medications on File Prior to Encounter  Medication Sig  . acetaminophen (TYLENOL) 325 MG tablet Take 650 mg by mouth every 6 (six) hours as needed for mild pain or fever.  Marland Kitchen atorvastatin (LIPITOR) 20 MG tablet TAKE 1 TABLET EVERY DAY  . benzonatate (TESSALON) 200 MG capsule Take 1 capsule (200 mg total) by mouth 2 (two) times daily as needed for cough.  . Dextromethorphan-Guaifenesin (ROBAFEN DM) 10-100 MG/5ML liquid Take 10 mLs by mouth every 12 (twelve) hours as needed.  Marland Kitchen ELIQUIS 5 MG TABS tablet TAKE 1 TABLET TWICE DAILY  (MUST  ESTABLISH  NEW  PRIMARY CARE PROVIDER  FOR FUTURE REFILLS)  . escitalopram (LEXAPRO) 10 MG tablet   . Fluticasone-Salmeterol (ADVAIR DISKUS) 250-50 MCG/DOSE AEPB Inhale 1 puff into the lungs 2 (two) times daily.  . furosemide (LASIX) 40 MG tablet Take 1 tablet (40 mg total) by mouth 2 (two) times daily.  Marland Kitchen glipiZIDE (GLUCOTROL) 5 MG tablet Take 5 mg by mouth daily before breakfast.  . levalbuterol (XOPENEX) 0.63 MG/3ML nebulizer solution Take 0.63 mg by nebulization daily as needed for wheezing or shortness of breath.   Marland Kitchen LORazepam (ATIVAN) 0.5 MG tablet Take 0.5 mg by mouth 2 (two) times daily.  . memantine (NAMENDA XR) 28 MG CP24 24 hr capsule Take 1 capsule (28 mg total) by mouth daily.  . metFORMIN (GLUCOPHAGE XR) 500 MG 24 hr tablet Take 1 tablet (500 mg total) by mouth daily with breakfast.  . mometasone-formoterol (DULERA) 200-5  MCG/ACT AERO Inhale 1 puff into the lungs 2 (two) times daily.  Marland Kitchen MUCINEX 600 MG 12 hr tablet TAKE ONE TABLET  TWICE DAILY  . OXYGEN Inhale 2 L into the lungs.  . polyethylene glycol (MIRALAX / GLYCOLAX) packet Take 17 g by mouth 2 (two) times daily.  . potassium chloride SA (K-DUR,KLOR-CON) 20 MEQ tablet Take 1 tablet (20 mEq total) by mouth 2 (two) times daily.  . predniSONE (DELTASONE) 10 MG tablet Take 1 tablet (10 mg total) by mouth daily with breakfast.  . predniSONE (DELTASONE) 20 MG tablet Take 20 mg by mouth daily with breakfast.  . senna-docusate (SENOKOT-S) 8.6-50 MG tablet Take 1 tablet by mouth 2 (two) times daily.  Marland Kitchen SPIRIVA HANDIHALER 18 MCG inhalation capsule INHALE THE CONTENTS OF 1 CAPSULE EVERY DAY  . B-D ULTRA-FINE 33 LANCETS MISC Use to help check blood sugars daily Dx E11.9  . chlorpheniramine-HYDROcodone (TUSSIONEX PENNKINETIC ER) 10-8 MG/5ML SUER Take 5 mLs by mouth 2 (two) times daily. (Patient not taking: Reported on 04/23/2017)  . glucose blood (BAYER CONTOUR TEST) test strip 1 each by Other route daily. Use to check blood sugars every day Dx E11.9  . levalbuterol (XOPENEX HFA) 45 MCG/ACT inhaler Inhale 2 puffs into the lungs every 6 (six) hours as needed. Reported on 09/23/2015 (Patient not taking: Reported on 04/23/2017)  . Respiratory Therapy Supplies (FLUTTER) DEVI Use as directed    FAMILY HISTORY:  Her indicated that her mother is deceased. She indicated that her father is deceased. She indicated that her maternal grandmother is deceased. She indicated that her maternal grandfather is deceased. She indicated that her paternal grandmother is deceased. She indicated that her paternal grandfather is deceased.   SOCIAL HISTORY: She  reports that she quit smoking about 5 years ago. Her smoking use included cigarettes. She has a 55.00 pack-year smoking history. she has never used smokeless tobacco. She reports that she drinks alcohol. She reports that she does not use drugs.  REVIEW OF SYSTEMS:   All negative; except for those that are bolded, which indicate  positives.  Constitutional: weight loss, weight gain, night sweats, fevers, chills, fatigue, weakness.  HEENT: headaches, sore throat, sneezing, nasal congestion, post nasal drip, difficulty swallowing, tooth/dental problems, visual complaints, visual changes, ear aches. Neuro: difficulty with speech, weakness, numbness, ataxia. CV:  chest pain, orthopnea, PND, swelling in lower extremities, dizziness, palpitations, syncope.  Resp: cough, hemoptysis, dyspnea, wheezing. GI: heartburn, indigestion, abdominal pain, nausea, vomiting, diarrhea, constipation, change in bowel habits, loss of appetite, hematemesis, melena, hematochezia.  GU: dysuria, change in color of urine, urgency or frequency, flank pain, hematuria. MSK: joint pain or swelling, decreased range of motion. Psych: change in mood or affect, depression, anxiety, suicidal ideations, homicidal ideations. Skin: rash, itching, bruising.  SUBJECTIVE:    VITAL SIGNS: BP 96/82 (BP Location: Left Leg)   Pulse 77   Temp (!) 97.5 F (36.4 C) (Oral)   Resp (!) 22   Ht 5\' 5"  (1.651 m)   Wt 173 lb 1 oz (78.5 kg)   SpO2 94% Comment: after neb treatment  BMI 28.80 kg/m   HEMODYNAMICS:    VENTILATOR SETTINGS:    INTAKE / OUTPUT: I/O last 3 completed shifts: In: 240 [P.O.:240] Out: 2850 [Urine:2850]  PHYSICAL EXAMINATION: Gen:      No acute distress, frail HEENT:  EOMI, sclera anicteric Neck:     No masses; no thyromegaly Lungs:    B/L exp wheeze; normal respiratory effort CV:  Regular rate and rhythm; no murmurs Abd:      + bowel sounds; soft, non-tender; no palpable masses, no distension Ext:    No edema; adequate peripheral perfusion Skin:      Warm and dry; no rash Neuro: alert and oriented x 3 Psych: normal mood and affect  LABS:  BMET Recent Labs  Lab 04/23/17 0929 04/24/17 0339 04/25/17 0352  NA 141 140 142  K 4.1 4.6 3.6  CL 105 106 106  CO2 25 24 24   BUN 12 14 19   CREATININE 1.21* 1.10* 1.18*   GLUCOSE 106* 197* 155*    Electrolytes Recent Labs  Lab 04/23/17 0929 04/24/17 0339 04/25/17 0352  CALCIUM 8.5* 8.9 9.0    CBC Recent Labs  Lab 04/23/17 0929 04/24/17 0339  WBC 9.5 7.6  HGB 13.2 12.6  HCT 41.3 40.3  PLT 181 182    Coag's No results for input(s): APTT, INR in the last 168 hours.  Sepsis Markers Recent Labs  Lab 04/23/17 0954 04/23/17 1226  LATICACIDVEN 1.17 1.06    ABG Recent Labs  Lab 04/23/17 1055 04/26/17 0945  PHART 7.370 7.518*  PCO2ART 43.5 37.8  PO2ART 67.0* 48.0*    Liver Enzymes Recent Labs  Lab 04/23/17 0929  AST 20  ALT 14  ALKPHOS 54  BILITOT 0.8  ALBUMIN 2.7*    Cardiac Enzymes No results for input(s): TROPONINI, PROBNP in the last 168 hours.  Glucose Recent Labs  Lab 04/24/17 2010 04/25/17 0855 04/25/17 1144 04/25/17 1713 04/25/17 1956 04/26/17 0741  GLUCAP 240* 107* 203* 263* 323* 82    Imaging Dg Chest Port 1 View  Result Date: 04/26/2017 CLINICAL DATA:  Shortness of breath EXAM: PORTABLE CHEST 1 VIEW COMPARISON:  04/23/2017 FINDINGS: The lungs are clear without focal pneumonia, edema, pneumothorax or pleural effusion. Interstitial markings are diffusely coarsened with chronic features. The cardio pericardial silhouette is enlarged. Left permanent pacemaker again noted. The visualized bony structures of the thorax are intact. IMPRESSION: Stable exam.  Cardiomegaly with chronic interstitial coarsening. Electronically Signed   By: Misty Stanley M.D.   On: 04/26/2017 09:26     STUDIES:    CULTURES: Flu test 04/23/17- positive for flu A.   Chest x-ray 04/23/17- coarse interstitial opacities. Chest x-ray 04/26/17-persistent chronic interstitial opacities, no new lung infiltrate I have reviewed the images personally  ANTIBIOTICS: Tamiflu 2/15>  Unsyn 2/18>   SIGNIFICANT EVENTS:   LINES/TUBES:    DISCUSSION: 84 year old with COPD GOLD D, OSA [not on CPAP], recurrent aspiration pneumonia, A. fib,  left upper lobe nodule with flu a infection  Acute on chronic respiratory failure, hypoxia FluA infection Agree with starting Unasyn for aspiration, pneumonia Finish Tamiflu Continue duo nebs Increase prednisone to Solu-Medrol 40 mg twice daily Start Mucinex and flutter valve for mucociliary clearance Supplemental oxygen  Left upper lobe PET positive nodule, presumed cancer Awaiting evaluation by oncology.  May need radiation therapy Discussed in clinic (see note from 04/09/17), she is not a good candidate for resection or bronchoscope  Marshell Garfinkel MD [facility] Pager [phone] If no answer or after 3pm call: [phone] 04/26/2017, 12:52 PM

## 2017-04-27 ENCOUNTER — Other Ambulatory Visit: Payer: Medicare Other

## 2017-04-27 ENCOUNTER — Ambulatory Visit: Payer: Medicare Other | Admitting: Oncology

## 2017-04-27 DIAGNOSIS — J441 Chronic obstructive pulmonary disease with (acute) exacerbation: Secondary | ICD-10-CM

## 2017-04-27 DIAGNOSIS — N183 Chronic kidney disease, stage 3 (moderate): Secondary | ICD-10-CM

## 2017-04-27 DIAGNOSIS — R69 Illness, unspecified: Secondary | ICD-10-CM

## 2017-04-27 DIAGNOSIS — E118 Type 2 diabetes mellitus with unspecified complications: Secondary | ICD-10-CM

## 2017-04-27 DIAGNOSIS — Z515 Encounter for palliative care: Secondary | ICD-10-CM

## 2017-04-27 DIAGNOSIS — I5032 Chronic diastolic (congestive) heart failure: Secondary | ICD-10-CM

## 2017-04-27 LAB — GLUCOSE, CAPILLARY
Glucose-Capillary: 173 mg/dL — ABNORMAL HIGH (ref 65–99)
Glucose-Capillary: 155 mg/dL — ABNORMAL HIGH (ref 65–99)
Glucose-Capillary: 223 mg/dL — ABNORMAL HIGH (ref 65–99)
Glucose-Capillary: 227 mg/dL — ABNORMAL HIGH (ref 65–99)

## 2017-04-27 MED ORDER — METHYLPREDNISOLONE SODIUM SUCC 40 MG IJ SOLR
40.0000 mg | Freq: Two times a day (BID) | INTRAMUSCULAR | Status: AC
  Administered 2017-04-27: 40 mg via INTRAVENOUS
  Filled 2017-04-27: qty 1

## 2017-04-27 MED ORDER — PREDNISONE 50 MG PO TABS
50.0000 mg | ORAL_TABLET | Freq: Every day | ORAL | Status: DC
  Administered 2017-04-28: 50 mg via ORAL
  Filled 2017-04-27: qty 1

## 2017-04-27 NOTE — Consult Note (Signed)
PULMONARY / CRITICAL CARE MEDICINE   Name: [name] MRN: [id number] DOB: 1931/06/10    ADMISSION DATE:  04/23/2017 CONSULTATION DATE:  04/26/16  REFERRING MD:  Vance Gather MD  CHIEF COMPLAINT: Dyspnea, wheezing.  HISTORY OF PRESENT ILLNESS:   [name] is well-known to me from clinic with history of COPD GOLD D, frequent exacerbations, ex smoker, Chronic aspiration, left upper lobe pulmonary nodule suspicious for malignancy, OSA  Admitted on 2/15 with dyspnea, wheezing found to be flu positive.  Treated with Tamiflu, prednisone, nebs She had been making good progress but became more acutely short of breath on 2/18.  Chest x-ray is unchanged. PCCM consulted for help with management.  PAST MEDICAL HISTORY :  She  has a past medical history of Acute blood loss anemia (04/20/2012), AF (atrial fibrillation) ([place]), Amiodarone pulmonary toxicity, Ascending aortic aneurysm ([place]) (12/11/2016), Asthma, Bronchitis, [name] (coronary artery disease), CHF (congestive heart failure) ([name]), Chronic renal insufficiency, stage III (moderate) ([facility]), CKD (chronic kidney disease) stage 3, GFR 30-59 ml/min ([place]), Complication of anesthesia, COPD (chronic obstructive pulmonary disease) ([place]), CVA (cerebral vascular accident) ([place]), Depression (06/06/2012), Diabetes ([place]), Diastolic heart failure, Eczema, GERD (gastroesophageal reflux disease), History of skin cancer, cardiovascular stress test, Hyperlipidemia, Invasive ductal carcinoma of breast ([place]) (2011), Mental disorder, OA (osteoarthritis) of knee, Pacemaker, PAD (peripheral artery disease) ([place]), Pain, Pneumonia, Right sided sciatica, Shortness of breath dyspnea, Sinoatrial node dysfunction ([place]), Sleep apnea, Tobacco abuse, and Tremors of nervous system.  PAST SURGICAL HISTORY: She  has a past surgical history that includes Mastectomy, partial (02/03/2010); Cataract extraction, bilateral; Breast lumpectomy; ORIF acetabular fracture (Left, 04/19/2012); Atrial  ablation surgery; Vascular surgery; Cardiac catheterization; Total knee arthroplasty (Right, 10/16/2013); sun spot removed ; Total hip arthroplasty (Left, 10/31/2014); and Cardiac catheterization (N/A, 02/13/2015).  Allergies  Allergen Reactions  . Cholestatin Other (See Comments)    RAGWEED SEASON...sneezing   . Sulfur Itching    No current facility-administered medications on file prior to encounter.    Current Outpatient Medications on File Prior to Encounter  Medication Sig  . acetaminophen (TYLENOL) 325 MG tablet Take 650 mg by mouth every 6 (six) hours as needed for mild pain or fever.  Marland Kitchen atorvastatin (LIPITOR) 20 MG tablet TAKE 1 TABLET EVERY DAY  . benzonatate (TESSALON) 200 MG capsule Take 1 capsule (200 mg total) by mouth 2 (two) times daily as needed for cough.  . Dextromethorphan-Guaifenesin (ROBAFEN DM) 10-100 MG/5ML liquid Take 10 mLs by mouth every 12 (twelve) hours as needed.  Marland Kitchen ELIQUIS 5 MG TABS tablet TAKE 1 TABLET TWICE DAILY  (MUST  ESTABLISH  NEW  PRIMARY CARE PROVIDER  FOR FUTURE REFILLS)  . escitalopram (LEXAPRO) 10 MG tablet   . Fluticasone-Salmeterol (ADVAIR DISKUS) 250-50 MCG/DOSE AEPB Inhale 1 puff into the lungs 2 (two) times daily.  . furosemide (LASIX) 40 MG tablet Take 1 tablet (40 mg total) by mouth 2 (two) times daily.  Marland Kitchen glipiZIDE (GLUCOTROL) 5 MG tablet Take 5 mg by mouth daily before breakfast.  . levalbuterol (XOPENEX) 0.63 MG/3ML nebulizer solution Take 0.63 mg by nebulization daily as needed for wheezing or shortness of breath.   Marland Kitchen LORazepam (ATIVAN) 0.5 MG tablet Take 0.5 mg by mouth 2 (two) times daily.  . memantine (NAMENDA XR) 28 MG CP24 24 hr capsule Take 1 capsule (28 mg total) by mouth daily.  . metFORMIN (GLUCOPHAGE XR) 500 MG 24 hr tablet Take 1 tablet (500 mg total) by mouth daily with breakfast.  . mometasone-formoterol (DULERA) 200-5  MCG/ACT AERO Inhale 1 puff into the lungs 2 (two) times daily.  Marland Kitchen MUCINEX 600 MG 12 hr tablet TAKE ONE TABLET  TWICE DAILY  . OXYGEN Inhale 2 L into the lungs.  . polyethylene glycol (MIRALAX / GLYCOLAX) packet Take 17 g by mouth 2 (two) times daily.  . potassium chloride SA (K-DUR,KLOR-CON) 20 MEQ tablet Take 1 tablet (20 mEq total) by mouth 2 (two) times daily.  . predniSONE (DELTASONE) 10 MG tablet Take 1 tablet (10 mg total) by mouth daily with breakfast.  . predniSONE (DELTASONE) 20 MG tablet Take 20 mg by mouth daily with breakfast.  . senna-docusate (SENOKOT-S) 8.6-50 MG tablet Take 1 tablet by mouth 2 (two) times daily.  Marland Kitchen SPIRIVA HANDIHALER 18 MCG inhalation capsule INHALE THE CONTENTS OF 1 CAPSULE EVERY DAY  . B-D ULTRA-FINE 33 LANCETS MISC Use to help check blood sugars daily Dx E11.9  . chlorpheniramine-HYDROcodone (TUSSIONEX PENNKINETIC ER) 10-8 MG/5ML SUER Take 5 mLs by mouth 2 (two) times daily. (Patient not taking: Reported on 04/23/2017)  . glucose blood (BAYER CONTOUR TEST) test strip 1 each by Other route daily. Use to check blood sugars every day Dx E11.9  . levalbuterol (XOPENEX HFA) 45 MCG/ACT inhaler Inhale 2 puffs into the lungs every 6 (six) hours as needed. Reported on 09/23/2015 (Patient not taking: Reported on 04/23/2017)  . Respiratory Therapy Supplies (FLUTTER) DEVI Use as directed    FAMILY HISTORY:  Her indicated that her mother is deceased. She indicated that her father is deceased. She indicated that her maternal grandmother is deceased. She indicated that her maternal grandfather is deceased. She indicated that her paternal grandmother is deceased. She indicated that her paternal grandfather is deceased.   SOCIAL HISTORY: She  reports that she quit smoking about 5 years ago. Her smoking use included cigarettes. She has a 55.00 pack-year smoking history. she has never used smokeless tobacco. She reports that she drinks alcohol. She reports that she does not use drugs.  REVIEW OF SYSTEMS:   All negative; except for those that are bolded, which indicate  positives.  Constitutional: weight loss, weight gain, night sweats, fevers, chills, fatigue, weakness.  HEENT: headaches, sore throat, sneezing, nasal congestion, post nasal drip, difficulty swallowing, tooth/dental problems, visual complaints, visual changes, ear aches. Neuro: difficulty with speech, weakness, numbness, ataxia. CV:  chest pain, orthopnea, PND, swelling in lower extremities, dizziness, palpitations, syncope.  Resp: cough, hemoptysis, dyspnea, wheezing. GI: heartburn, indigestion, abdominal pain, nausea, vomiting, diarrhea, constipation, change in bowel habits, loss of appetite, hematemesis, melena, hematochezia.  GU: dysuria, change in color of urine, urgency or frequency, flank pain, hematuria. MSK: joint pain or swelling, decreased range of motion. Psych: change in mood or affect, depression, anxiety, suicidal ideations, homicidal ideations. Skin: rash, itching, bruising.  SUBJECTIVE:  Sitting up in bed Feels breathing is improved.  No acute events overnight.  VITAL SIGNS: BP 119/76   Pulse 71   Temp 98.2 F (36.8 C)   Resp 18   Ht 5\' 5"  (1.651 m)   Wt 171 lb 15.3 oz (78 kg)   SpO2 94%   BMI 28.62 kg/m   HEMODYNAMICS:    VENTILATOR SETTINGS:    INTAKE / OUTPUT: I/O last 3 completed shifts: In: 650 [P.O.:450; IV Piggyback:200] Out: 3300 [Urine:3300]  PHYSICAL EXAMINATION: Gen:      No acute distress, frail HEENT:  EOMI, sclera anicteric Neck:     No masses; no thyromegaly Lungs:    Bilateral expiratory wheeze, scattered crackles.; normal  respiratory effort CV:         Regular rate and rhythm; no murmurs Abd:      + bowel sounds; soft, non-tender; no palpable masses, no distension Ext:    No edema; adequate peripheral perfusion Skin:      Warm and dry; no rash Neuro: alert and oriented x 3 Psych: normal mood and affect  LABS:  BMET Recent Labs  Lab 04/23/17 0929 04/24/17 0339 04/25/17 0352  NA 141 140 142  K 4.1 4.6 3.6  CL 105 106 106   CO2 25 24 24   BUN 12 14 19   CREATININE 1.21* 1.10* 1.18*  GLUCOSE 106* 197* 155*    Electrolytes Recent Labs  Lab 04/23/17 0929 04/24/17 0339 04/25/17 0352  CALCIUM 8.5* 8.9 9.0    CBC Recent Labs  Lab 04/23/17 0929 04/24/17 0339  WBC 9.5 7.6  HGB 13.2 12.6  HCT 41.3 40.3  PLT 181 182    Coag's No results for input(s): APTT, INR in the last 168 hours.  Sepsis Markers Recent Labs  Lab 04/23/17 0954 04/23/17 1226  LATICACIDVEN 1.17 1.06    ABG Recent Labs  Lab 04/23/17 1055 04/26/17 0945  PHART 7.370 7.518*  PCO2ART 43.5 37.8  PO2ART 67.0* 48.0*    Liver Enzymes Recent Labs  Lab 04/23/17 0929  AST 20  ALT 14  ALKPHOS 54  BILITOT 0.8  ALBUMIN 2.7*    Cardiac Enzymes No results for input(s): TROPONINI, PROBNP in the last 168 hours.  Glucose Recent Labs  Lab 04/25/17 1956 04/26/17 0741 04/26/17 1240 04/26/17 1725 04/26/17 2130 04/27/17 0749  GLUCAP 323* 82 210* 369* 239* 173*    Imaging No results found.   STUDIES:    CULTURES: Flu test 04/23/17- positive for flu A.   Chest x-ray 04/23/17- coarse interstitial opacities. Chest x-ray 04/26/17-persistent chronic interstitial opacities, no new lung infiltrate I have reviewed the images personally  ANTIBIOTICS: Tamiflu 2/15>  Unsyn 2/18>   SIGNIFICANT EVENTS:  LINES/TUBES:   DISCUSSION: 84 year old with COPD GOLD D, OSA [not on CPAP], recurrent aspiration pneumonia, A. fib, left upper lobe nodule with flu a infection  Acute on chronic respiratory failure, hypoxia FluA infection Continue Unasyn for aspiration, pneumonia Finish Tamiflu Continue duo nebs Continue Solu-Medrol 40 twice daily. Mucinex and flutter valve for mucociliary clearance Supplemental oxygen  Left upper lobe PET positive nodule, presumed cancer Awaiting evaluation by oncology.  May need radiation therapy Palliative care meeting set for 2/19.  Marshell Garfinkel MD [facility] Pager [phone] If no answer or after 3pm call: [phone] 04/27/2017, 12:43 PM

## 2017-04-27 NOTE — Consult Note (Signed)
Consultation Note Date: 04/27/2017   Patient Name: [name]  DOB: Mar 30, 1931  MRN: [id number]  Age / Sex: 84 y.o., female  PCP: Tiffany Koch, MD Referring Physician: Domenic Polite, MD  Reason for Consultation: Establishing goals of care  HPI/Patient Profile: 84 y.o. female  with past medical history of COPD stg GOLD D, OSA, PAD, D-HF, Afib, s/p pacemaker placement, BRCA, CVA, CKD3, AAA, and chronic aspiration who was admitted on 04/23/2017 with acute on chronic respiratory failure with hypoxia. Work up revealed that the patient was influenza positive.   Of note, prior to admission, the patient was recently found to have a LUL pulmonary nodule.  PET imaging indicated it was suspicious for primary lung cancer. Per 2/1 pulmonology notes the patient is not a candidate for bronchoscopy for surgical resection due to overall medical condition and frailty.  Clinical Assessment and Goals of Care:  I have reviewed medical records including EPIC notes, labs and imaging, received report from the care team, assessed the patient and then called her daughter, [name],  to set a time to discuss diagnosis prognosis, GOC, EOL wishes, disposition and options.  I introduced Palliative Medicine as specialized medical care for people living with serious illness. It focuses on providing relief from the symptoms and stress of a serious illness. The goal is to improve quality of life for both the patient and the family.  We discussed a brief life review of the patient. She has been living at [facility] with her husband [name].  Not long ago they moved to a higher level of care within [place] for [name].  [name] has an active mind and is very engaging in conversation.  She tells me she enjoys gardening and has ordered [name] online that are due to arrive today.  She is slightly saddened because she cant garden  like she used to.  She is a proud  member of the [place] - and seems to derive strength from her faith.  Notes from Epic indicate she has a degree in microbiology and worked for the [employer].     She appears to have had decline particularly over the past year.  She had two significant hospital stays in 2018 for hypoxic respiratory failure thought to be due to aspiration.  As far as functional and nutritional status per notes she was able to ambulate with a rolling walker prior to admission.  Her nutritional status has been somewhat poor for the past year with an albumin in the 2s.  At a high level [name] and I discussed her new lung nodule.  She stated "now they think I have cancer".  She became tearful and expressed "I really don't want to have cancer".  I attempted to elicit values and goals of care important to the patient.  It seems that in the past Lurie has wanted to pursue aggressive options - now she was questioning if treatment would help.  [place] and dialed her daughter [name] and arranged a family meeting for  2/19 at 11:30.    Primary Decision Maker:  PATIENT and her daughter [name]    SUMMARY OF RECOMMENDATIONS   PMT will meet with patient and family on 2/19 at 11:30 Patient is SOB several times during our conversation.  Will add very low dose morphine for SOB.  Code Status/Advance Care Planning:  DNR  Palliative Prophylaxis:   Aspiration and Delirium Protocol  Psycho-social/Spiritual:   Desire for further Chaplaincy support:  Yes requested.  Prognosis:  Less than 6 months given recurrent hypoxic respiratory failure secondary to aspiration in the setting of advanced COPD, and new lung cancer, stage 3 CKD, a history of diastolic heart failure and possible cirrhosis, as well as overall physical decline.    Discharge Planning: To Be Determined.  Return to ALF with Hospice vs Return to ALF with Palliative and Home Health      Primary  Diagnoses: Present on Admission: . Atrial fibrillation ([place]) . [name] (coronary artery disease) . Controlled diabetes mellitus type 2 with complications ([place]) . Diastolic CHF, chronic ([place]) . GERD (gastroesophageal reflux disease) . Gold stage C. COPD with frequent exacerbations . Obstructive sleep apnea . PACEMAKER, PERMANENT . Cough . Fever . Influenza A . Acute on chronic respiratory failure with hypoxia ([place]) . CKD (chronic kidney disease) stage 3, GFR 30-59 ml/min (HCC) . Primary cancer of left upper lobe of lung ([place])   I have reviewed the medical record, interviewed the patient and family, and examined the patient. The following aspects are pertinent.  Past Medical History:  Diagnosis Date  . Acute blood loss anemia 04/20/2012  . AF (atrial fibrillation) ([place])     AV ablation 9/09 [place] per Dr Ola Spurr - AV node ablation 9/11 Dr Caryl Comes  . Amiodarone pulmonary toxicity   . Ascending aortic aneurysm ([place]) 12/11/2016  . Asthma   . Bronchitis    hx of   . [name] (coronary artery disease)    (not sure of this 11/10  . CHF (congestive heart failure) ([place])   . Chronic renal insufficiency, stage III (moderate) (HCC)    CrCl about 60 ml/min  . CKD (chronic kidney disease) stage 3, GFR 30-59 ml/min (HCC)   . Complication of anesthesia    "psycotic episode" after hip surg - resolved  . COPD (chronic obstructive pulmonary disease) (HCC)    emphysema -FeV1 73% DLCO 53% 5/09  . CVA (cerebral vascular accident) ([place])    no residual effects evident to pt   . Depression 06/06/2012  . Diabetes ([place])   . Diastolic heart failure    Acute on Chronic  . Eczema   . GERD (gastroesophageal reflux disease)   . History of skin cancer   . Hx of cardiovascular stress test    Lexiscan Myoview (09/2013):  No definite ischemia, EF 67%; low risk  . Hyperlipidemia   . Invasive ductal carcinoma of breast ([place]) 2011   LEFT   . Mental disorder   . OA (osteoarthritis) of knee    RIGHT  . Pacemaker     Permanent  . PAD (peripheral artery disease) (HCC)    w/hx right iliac/SFA stenting and left and rt leg PTA  . Pain    pt states has pain in fingertips per right hand pt states has been told may be carpal tunnel  . Pneumonia    hx of   . Right sided sciatica   . Shortness of breath dyspnea    walking distances   . Sinoatrial node dysfunction (HCC)   .  Sleep apnea    associated with hypersomnia uses O2 2L/M at night and during naps also uses CPAP  . Tobacco abuse   . Tremors of nervous system    hands bilat    Social History   Socioeconomic History  . Marital status: Married    Spouse name: None  . Number of children: None  . Years of education: None  . Highest education level: None  Social Needs  . Financial resource strain: None  . Food insecurity - worry: None  . Food insecurity - inability: None  . Transportation needs - medical: None  . Transportation needs - non-medical: None  Occupational History  . None  Tobacco Use  . Smoking status: Former Smoker    Packs/day: 1.00    Years: 55.00    Pack years: 55.00    Types: Cigarettes    Last attempt to quit: 12/19/2011    Years since quitting: 5.3  . Smokeless tobacco: Never Used  . Tobacco comment: started smoking at age 39--4 cigs per day.  smoked 2ppd the last 10 yrs  Substance and Sexual Activity  . Alcohol use: Yes    Comment: wine (rare)  . Drug use: No  . Sexual activity: No  Other Topics Concern  . None  Social History Narrative   HS Graduate; [employer].  Married '61.  2 sons - '70, '71; Dtrs - '64,'68;    6 grandchildren.  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 Interests -Tai-chi & water aerobics, gardening, active lifestyle.  Marriage a bit stressful - SO w/membory problems and difficult behavior. She denies any personal safety concerns. End  of life care: need to address at next OV             Family History  Problem Relation Age of Onset  . Heart disease Father    Scheduled Meds: . apixaban  5 mg Oral BID  . atorvastatin  20 mg Oral Q2000  . escitalopram  10 mg Oral Daily  . furosemide  40 mg Oral BID  . guaiFENesin  1,200 mg Oral BID  . insulin aspart  0-15 Units Subcutaneous TID WC  . insulin aspart  0-5 Units Subcutaneous QHS  . insulin aspart  3 Units Subcutaneous TID WC  . ipratropium-albuterol  3 mL Nebulization TID  . LORazepam  0.5 mg Oral BID  . memantine  28 mg Oral Daily  . methylPREDNISolone (SOLU-MEDROL) injection  40 mg Intravenous Q12H  . oseltamivir  30 mg Oral BID  . senna-docusate  1 tablet Oral BID   Continuous Infusions: . ampicillin-sulbactam (UNASYN) IV Stopped (04/27/17 0341)   PRN Meds:.acetaminophen **OR** acetaminophen, albuterol, morphine injection, ondansetron **OR** ondansetron (ZOFRAN) IV Allergies  Allergen Reactions  . Cholestatin Other (See Comments)    RAGWEED SEASON...sneezing   . Sulfur Itching   Review of Systems patient reported SOB, decreased appetite, fatigue, weakness  Physical Exam  Elderly female,  Some increased work of breathing while lying at rest.  A&O, Pleasant, Coherent CV irreg irreg Resp - mildly increased work of breathing on 4L n/c.  Wet breath sounds heard without auscultation Abdomen obese, soft, nt  Vital Signs: BP 119/76   Pulse 71   Temp 98.2 F (36.8 C)   Resp 18   Ht 5' 5"  (1.651 m)   Wt 78 kg (171 lb 15.3 oz)  SpO2 93%   BMI 28.62 kg/m  Pain Assessment: No/denies pain   Pain Score: 0-No pain   SpO2: SpO2: 93 % O2 Device:SpO2: 93 % O2 Flow Rate: .O2 Flow Rate (L/min): 3 L/min  IO: Intake/output summary:   Intake/Output Summary (Last 24 hours) at 04/27/2017 0720 Last data filed at 04/27/2017 0311 Gross per 24 hour  Intake 650 ml  Output 1900 ml  Net -1250 ml    LBM: Last BM Date: 04/24/17 Baseline Weight: Weight: 78.6 kg  (173 lb 4.5 oz) Most recent weight: Weight: 78 kg (171 lb 15.3 oz)     Palliative Assessment/Data: 30%     Time In: 4:30 Time Out: 5:20 Time Total: 50 min. Greater than 50%  of this time was spent counseling and coordinating care related to the above assessment and plan.  Signed by: Florentina Jenny, PA-C Palliative Medicine Pager: [phone]  Please contact Palliative Medicine Team phone at [phone] for questions and concerns.  For individual provider: See [facility]

## 2017-04-27 NOTE — Progress Notes (Signed)
PROGRESS NOTE  [name]  PNT:[id number] DOB: June 11, 1931 DOA: 04/23/2017 PCP: Hoyt Koch, MD  Outpatient Specialists: Vaughan Browner, pulmonology Brief Narrative: [name] is an 84 y.o. female with a history of COPD nonadherent to home oxygen, AFib on eliquis s/p PPM, recurrent aspiration PNA, T2DM, and recently diagnosed lung cancer without tissue Dx or Tx thus far who presented to the ED from ALF due to fever and hypoxia in the setting of recent increasing dyspnea and increased sputum production and wheezing in the ED. Influenza swab was positive in the ED without infiltrate on CXR. Nebulizers, steroids, and tamiflu were started and the patient admitted.   Assessment & Plan: Principal Problem:  Acute on chronic hypoxic respiratory failure:  -primarily now due to influenza and COPD exacerbation in patient with limited pulmonary reserve due to severe COPD, recurrent aspiration PNA, OSA not compliant with CPAP, and likely bronchogenic carcinoma. -2/17 deteriorated and started Empiric Unasyn for possible Aspiration PNA -Appreciate Pulm input -continue IV steroids today, taper to prednisone -complete Tamiflu course tomorrow -wean O2 down to 2L  GOLD D COPD with exacerbation  -due to influenza A:  -PFTs recently showed moderate COPD with severe reduction in DLCO -as above  Chronic dysphagia -last SLP eval as outpatient 2/11 -recommended D2 diet with thickened liquids, questionable compliance with this  Suspected Lung CA -PET + left upper lobe pulmonary nodule: Strongly suggestive of bronchogenic CA in former heavy smoker: Pt and son discussed with pulmonology, Dr. Vaughan Browner as outpatient. He felt risks were prohibitive of bronchoscopic biopsy, CT-guided biopsy, and resection due to severity of comorbidities/frailty. Radiation therapy was considered.  - Dr.Grunz d/w Dr. Jana Hakim. He had agreed to see the patient while in the hospital since she would miss the appointment 2/19,  though with her current status, she would not be a candidate for any therapy including XRT. He plans to reschedule the appointment for about 6 weeks.  - Appreciate palliative care input, plan for family meeting today  Chronic kidney disease stage III.  -stable at baseline. - Monitor  Chronic systolic heart failure, ischemic cardiomyopathy:  - clinically compensated - Continue home medications including lasix 40mg  po BID, weights stable. - Strict I/O, daily weights  P.AFib  -s/p failed ablations now s/p PPM: CHA2DS2-VASc is 4.  - Continue eliquis  DM type 2 - HbA1c 7.7%, exacerbated by ongoing steroid use. - Holding home regimen of Glipizide, metformin - SSI  Depression, anxiety, memory impairment:  - Continue SSRI, benzodiazepine, namenda home meds  DVT prophylaxis: Eliquis Code Status: DNR Family Communication: none at bedside, will d/w family tomorrow Disposition Plan: Plan currently to return to ALF with [facility]  Consultants: Pulmonology,  palliative care; Dr.Grunz discussed with oncology  Procedures: None Antimicrobials: Tamiflu 2/15 - 2/20; Unasyn 2/18 >>   Subjective: -reports feeling and breathing better, was able to eat breakfast, awaiting Palliative meeting  Objective: Vitals:   04/26/17 2131 04/27/17 0539 04/27/17 0750 04/27/17 1403  BP: 118/78 119/76  112/63  Pulse: 72 71  77  Resp: 19 18  18   Temp: 98 F (36.7 C) 98.2 F (36.8 C)  97.9 F (36.6 C)  TempSrc: Oral   Oral  SpO2: 94% 93% 94% 93%  Weight:  78 kg (171 lb 15.3 oz)    Height:        Intake/Output Summary (Last 24 hours) at 04/27/2017 1631 Last data filed at 04/27/2017 1404 Gross per 24 hour  Intake 650 ml  Output 1300 ml  Net -  650 ml   Filed Weights   04/25/17 0625 04/27/17 0539  Weight: 78.5 kg (173 lb 1 oz) 78 kg (171 lb 15.3 oz)   Gen: elderly chronically ill female, Awake, Alert, Oriented X 3,  HEENT: PERRLA, Neck supple, no JVD Lungs: coarse BS bilaterally, no  wheezing CVS: S1S2/RRR Abd: soft, Non tender, non distended, BS present Extremities: trace edema Skin: no new rashes Psych: Judgement and insight appear fair. Mood & affect appropriate.   CBC: Recent Labs  Lab 04/23/17 0929 04/24/17 0339  WBC 9.5 7.6  NEUTROABS 6.8  --   HGB 13.2 12.6  HCT 41.3 40.3  MCV 92.8 93.5  PLT 181 867   Basic Metabolic Panel: Recent Labs  Lab 04/23/17 0929 04/24/17 0339 04/25/17 0352  NA 141 140 142  K 4.1 4.6 3.6  CL 105 106 106  CO2 25 24 24   GLUCOSE 106* 197* 155*  BUN 12 14 19   CREATININE 1.21* 1.10* 1.18*  CALCIUM 8.5* 8.9 9.0   GFR: Estimated Creatinine Clearance: 37.3 mL/min (A) (by C-G formula based on SCr of 1.18 mg/dL (H)). Liver Function Tests: Recent Labs  Lab 04/23/17 0929  AST 20  ALT 14  ALKPHOS 54  BILITOT 0.8  PROT 6.3*  ALBUMIN 2.7*   Recent Labs  Lab 04/23/17 0929  LIPASE 26   HbA1C: No results for input(s): HGBA1C in the last 72 hours. CBG: Recent Labs  Lab 04/26/17 1240 04/26/17 1725 04/26/17 2130 04/27/17 0749 04/27/17 1332  GLUCAP 210* 369* 239* 173* 155*   Urine analysis:    Component Value Date/Time   COLORURINE YELLOW 04/24/2017 0721   APPEARANCEUR CLEAR 04/24/2017 0721   LABSPEC 1.013 04/24/2017 0721   PHURINE 5.0 04/24/2017 0721   GLUCOSEU NEGATIVE 04/24/2017 0721   HGBUR NEGATIVE 04/24/2017 0721   BILIRUBINUR NEGATIVE 04/24/2017 0721   KETONESUR NEGATIVE 04/24/2017 0721   PROTEINUR NEGATIVE 04/24/2017 0721   UROBILINOGEN 0.2 10/24/2014 1433   NITRITE NEGATIVE 04/24/2017 0721   LEUKOCYTESUR NEGATIVE 04/24/2017 0721   Recent Results (from the past 240 hour(s))  Blood Culture (routine x 2)     Status: None (Preliminary result)   Collection Time: 04/23/17  9:36 AM  Result Value Ref Range Status   Specimen Description BLOOD LEFT ANTECUBITAL  Final   Special Requests   Final    BOTTLES DRAWN AEROBIC AND ANAEROBIC Blood Culture adequate volume   Culture   Final    NO GROWTH 4  DAYS Performed at [facility], [place] [address], [place], [place] [address]    Report Status PENDING  Incomplete  Blood Culture (routine x 2)     Status: None (Preliminary result)   Collection Time: 04/23/17 12:20 PM  Result Value Ref Range Status   Specimen Description BLOOD RIGHT HAND  Final   Special Requests IN PEDIATRIC BOTTLE Blood Culture adequate volume  Final   Culture   Final    NO GROWTH 4 DAYS Performed at [facility], [place] [address], [place], [place] [address]    Report Status PENDING  Incomplete      Radiology Studies: Dg Chest Port 1 View  Result Date: 04/26/2017 CLINICAL DATA:  Shortness of breath EXAM: PORTABLE CHEST 1 VIEW COMPARISON:  04/23/2017 FINDINGS: The lungs are clear without focal pneumonia, edema, pneumothorax or pleural effusion. Interstitial markings are diffusely coarsened with chronic features. The cardio pericardial silhouette is enlarged. Left permanent pacemaker again noted. The visualized bony structures of the thorax are intact. IMPRESSION:  Stable exam.  Cardiomegaly with chronic interstitial coarsening. Electronically Signed   By: Misty Stanley M.D.   On: 04/26/2017 09:26    Scheduled Meds: . apixaban  5 mg Oral BID  . atorvastatin  20 mg Oral Q2000  . escitalopram  10 mg Oral Daily  . furosemide  40 mg Oral BID  . guaiFENesin  1,200 mg Oral BID  . insulin aspart  0-15 Units Subcutaneous TID WC  . insulin aspart  0-5 Units Subcutaneous QHS  . insulin aspart  3 Units Subcutaneous TID WC  . ipratropium-albuterol  3 mL Nebulization TID  . LORazepam  0.5 mg Oral BID  . memantine  28 mg Oral Daily  . oseltamivir  30 mg Oral BID  . [START ON 04/28/2017] predniSONE  50 mg Oral Q breakfast  . senna-docusate  1 tablet Oral BID   Continuous Infusions: . ampicillin-sulbactam (UNASYN) IV Stopped (04/27/17 1115)     LOS: 3 days   Time spent: 25 minutes.  Domenic Polite, MD [facility] Page via [email or web address] password [email or web address]  If  7PM-7AM, please contact night-coverage  04/27/2017, 4:31 PM

## 2017-04-27 NOTE — Progress Notes (Signed)
Wrong time, patient transfer to chair at 1145 and sat in the chair until 1343

## 2017-04-27 NOTE — Progress Notes (Signed)
Physical Therapy Treatment Patient Details Name: [name] MRN: [id number] DOB: November 22, 1931 Today's Date: 04/27/2017    History of Present Illness [name] is an 84 y.o. female with a history of COPD nonadherent to home oxygen, AFib on eliquis s/p PPM, recurrent aspiration PNA, T2DM, and recently diagnosed lung cancer without tissue Dx or Tx thus far who presented to the ED from ALF due to fever and hypoxia in the setting of recent increasing dyspnea and increased sputum production and wheezing in the ED. Influenza swab was positive in the ED without infiltrate on CXR. Nebulizers, steroids, and tamiflu were started and the patient admitted.     PT Comments    Pt feeling better today with mobility, ambulated 24' within room with RW and min-guard A. O2 sats was 93% on 3L at rest, as low as 88% with ambulation on 4L and returned to mid 90's on 3L in seated position. Pt encouraged to be able to mobilize despite fatigue. PT will continue to follow.   Follow Up Recommendations  Home health PT;Supervision - Intermittent     Equipment Recommendations  None recommended by PT    Recommendations for Other Services       Precautions / Restrictions Precautions Precautions: Fall Precaution Comments: watch O2 sats Restrictions Weight Bearing Restrictions: No    Mobility  Bed Mobility Overal bed mobility: Needs Assistance Bed Mobility: Supine to Sit     Supine to sit: Supervision     General bed mobility comments: cues to breathe throughout transition to EOB. Pt needed rail and increased time. Needed rest time before being able to scoot out to EOB  Transfers Overall transfer level: Needs assistance Equipment used: Rolling walker (2 wheeled) Transfers: Sit to/from Stand Sit to Stand: Min guard         General transfer comment: needed vc's for hand placement each time she stood from bed or recliner. No physical assist needed from bed or  recliner  Ambulation/Gait Ambulation/Gait assistance: Min guard Ambulation Distance (Feet): 24 Feet Assistive device: Rolling walker (2 wheeled) Gait Pattern/deviations: Step-through pattern;Decreased stride length;Shuffle;Trunk flexed Gait velocity: decreased   General Gait Details: pt was on 3L at rest, increased to 4L with ambualtion and O2 sats did not drop below 88%. Quick return to mid 90's in sitting, and return to 3L.    Stairs            Wheelchair Mobility    Modified Rankin (Stroke Patients Only)       Balance Overall balance assessment: Needs assistance Sitting-balance support: Single extremity supported Sitting balance-Leahy Scale: Fair     Standing balance support: Bilateral upper extremity supported Standing balance-Leahy Scale: Poor Standing balance comment: unstable without UE support                            Cognition Arousal/Alertness: Awake/alert Behavior During Therapy: WFL for tasks assessed/performed Overall Cognitive Status: History of cognitive impairments - at baseline                                 General Comments: pt seems presents close to baseline today, able to give PLOF and appropriate throughout session. Daughter present and agreeing with pt's statements. STM deficits noted.        Exercises General Exercises - Lower Extremity Ankle Circles/Pumps: AROM;Both;20 reps;Supine Heel Slides: AROM;Both;10 reps;Supine    General Comments  Pertinent Vitals/Pain Pain Assessment: No/denies pain    Home Living                      Prior Function            PT Goals (current goals can now be found in the care plan section) Acute Rehab PT Goals Patient Stated Goal: get better PT Goal Formulation: With patient Time For Goal Achievement: 05/09/17 Potential to Achieve Goals: Good Progress towards PT goals: Progressing toward goals    Frequency    Min 3X/week      PT Plan Current  plan remains appropriate    Co-evaluation              AM-PAC PT "6 Clicks" Daily Activity  Outcome Measure  Difficulty turning over in bed (including adjusting bedclothes, sheets and blankets)?: A Little Difficulty moving from lying on back to sitting on the side of the bed? : A Little Difficulty sitting down on and standing up from a chair with arms (e.g., wheelchair, bedside commode, etc,.)?: A Little Help needed moving to and from a bed to chair (including a wheelchair)?: A Little Help needed walking in hospital room?: A Little Help needed climbing 3-5 steps with a railing? : A Lot 6 Click Score: 17    End of Session Equipment Utilized During Treatment: Oxygen;Gait belt Activity Tolerance: Patient tolerated treatment well Patient left: in chair;with chair alarm set;with call bell/phone within reach Nurse Communication: Mobility status PT Visit Diagnosis: Other abnormalities of gait and mobility (R26.89);Muscle weakness (generalized) (M62.81)     Time: 0034-9179 PT Time Calculation (min) (ACUTE ONLY): 37 min  Charges:  $Gait Training: 8-22 mins $Therapeutic Activity: 8-22 mins                    G Codes:       [name] 04/27/2017, 1:38 PM

## 2017-04-27 NOTE — Care Management Important Message (Signed)
Important Message  Patient Details  Name: [name] MRN: [id number] Date of Birth: [birth year]   Medicare Important Message Given:  Yes left at bedside    Carles Collet, RN 04/27/2017, 10:18 AM

## 2017-04-27 NOTE — Progress Notes (Addendum)
Extended conversation with patient's daughter Scheryl Marten), son Inocente Salles), and daughter Zigmund Daniel).  Zigmund Daniel was on the phone.  We discussed the patient's overall condition, recurrent hospitalizations, the pathophysiology behind her poor nutritional status,D- heart failure, pulmonary htn, COPD, stg 3 CKD and recurrent aspiration pneumonia.  Her most recent CT chest was reviewed.  It makes mention of changes of cirrhosis.  All of this contributes to her very poor pulmonary status and decreased ability to recover.  The patient's son and daughter brought up recurrent mental status changes.  They described a level of delirium and paranoia that has appeared not only in the hospital but also at home.  We discussed concern for a degree of vascular dementia.  We had conversation about the new LUL nodule which is most likely malignant.  I explained that Mrs. Capes is unable to undergo biopsy or surgery.  The CT chest indicates that the tumor is likely slow growing.  At this point I doubt the tumor is significantly contributing to her symptoms.   If it is truly slow growing she will most likely die of COPD and aspiration pneumonia before lung cancer.    We discussed the possibility of radiation.  [name] expressed significant concerns that it would cause fatigue and further debilitation.   I introduced the idea of Hospice services in the home (at ALF) to bring extra support to Mrs. Goodness.  The three children were in agreement with moving forward with hospice services at home.  [name] and I then went to speak with Mrs. Traywick.  The conversation seemed to go off track.  Radiation was discussed - Mrs. Barton did not want radiation.  The subject of hospice was brought up but not thoroughly discussed with the patient.  At this point I ended the conversation and we decided to talk again tomorrow.  The patient and her children asked if they could speak with Oncology this evening.  They would like to resolve the issue of what to do  about the lung nodule.  Florentina Jenny, PA-C Palliative Medicine Pager: [phone]  Time in 11:30 Time out 1:30 pm. Total time 120 min.

## 2017-04-28 DIAGNOSIS — I482 Chronic atrial fibrillation: Secondary | ICD-10-CM

## 2017-04-28 DIAGNOSIS — L8990 Pressure ulcer of unspecified site, unspecified stage: Secondary | ICD-10-CM

## 2017-04-28 LAB — CULTURE, BLOOD (ROUTINE X 2)
CULTURE: NO GROWTH
CULTURE: NO GROWTH
Special Requests: ADEQUATE
Special Requests: ADEQUATE

## 2017-04-28 LAB — BASIC METABOLIC PANEL
ANION GAP: 11 (ref 5–15)
BUN: 22 mg/dL — ABNORMAL HIGH (ref 6–20)
CO2: 27 mmol/L (ref 22–32)
Calcium: 8.3 mg/dL — ABNORMAL LOW (ref 8.9–10.3)
Chloride: 104 mmol/L (ref 101–111)
Creatinine, Ser: 1.18 mg/dL — ABNORMAL HIGH (ref 0.44–1.00)
GFR, EST AFRICAN AMERICAN: 48 mL/min — AB (ref >60)
GFR, EST NON AFRICAN AMERICAN: 41 mL/min — AB (ref >60)
Glucose, Bld: 134 mg/dL — ABNORMAL HIGH (ref 65–99)
POTASSIUM: 3.3 mmol/L — AB (ref 3.5–5.1)
SODIUM: 142 mmol/L (ref 135–145)

## 2017-04-28 LAB — CBC
HCT: 38.5 % (ref 36.0–46.0)
Hemoglobin: 12.3 g/dL (ref 12.0–15.0)
MCH: 29.1 pg (ref 26.0–34.0)
MCHC: 31.9 g/dL (ref 30.0–36.0)
MCV: 91 fL (ref 78.0–100.0)
PLATELETS: 230 10*3/uL (ref 150–400)
RBC: 4.23 MIL/uL (ref 3.87–5.11)
RDW: 16.6 % — ABNORMAL HIGH (ref 11.5–15.5)
WBC: 11.2 10*3/uL — AB (ref 4.0–10.5)

## 2017-04-28 LAB — GLUCOSE, CAPILLARY
Glucose-Capillary: 118 mg/dL — ABNORMAL HIGH (ref 65–99)
Glucose-Capillary: 165 mg/dL — ABNORMAL HIGH (ref 65–99)
Glucose-Capillary: 275 mg/dL — ABNORMAL HIGH (ref 65–99)

## 2017-04-28 MED ORDER — AMOXICILLIN-POT CLAVULANATE 875-125 MG PO TABS
1.0000 | ORAL_TABLET | Freq: Two times a day (BID) | ORAL | Status: DC
  Administered 2017-04-28 – 2017-04-29 (×2): 1 via ORAL
  Filled 2017-04-28 (×2): qty 1

## 2017-04-28 MED ORDER — PREDNISONE 20 MG PO TABS
40.0000 mg | ORAL_TABLET | Freq: Every day | ORAL | Status: DC
  Administered 2017-04-29: 40 mg via ORAL
  Filled 2017-04-28: qty 2

## 2017-04-28 MED ORDER — POTASSIUM CHLORIDE CRYS ER 20 MEQ PO TBCR
40.0000 meq | EXTENDED_RELEASE_TABLET | Freq: Once | ORAL | Status: AC
  Administered 2017-04-28: 40 meq via ORAL
  Filled 2017-04-28: qty 2

## 2017-04-28 NOTE — Progress Notes (Signed)
[facility])  Received request from [facility] for family interest in hospice services at [facility] after discharge. Chart information reviewed. Eligibility confirmed by [facility] physician.   Spoke with POA/daughter [name] in family room to initiate education related to hospice philosophy, services and team approach to care. [name] verbalized good understanding of information provided. Per discussion, plan is for patient to return to [facility] via family car. [facility] contact information was given to [name] during this visit.   DME needs discussed. Patient requesting 3N1 to be delivered to facility. [facility] equipment manager has been notified and will contact [place] to arrange delivery to the facility. Daughter [name] and facility are contacts to arrange delivery of DME. Patient is returning to room 38. Patient already has oxygen, hospital bed, walkers and cane.   [facility] referral center is aware of the above and will arrange nursing admission visit.   Please send out of facility DNR with patient upon discharge.   Please send scripts for any medication patient does not already have including comfort medications.   Please send discharge summary to [phone].  Please notify [facility] when patient is discharged by calling [phone] before 5 PM or [phone] after 5 PM.  Please call with hospice related questions.   Thank you for this referral.  Erling Conte, LCSW  [phone]

## 2017-04-28 NOTE — Care Management (Signed)
Patient's son is Genine Beckett cell [phone] . Patient is possible discharge tomorrow 04-29-17, Mr Colombe may be able to transport his mother home , depends on time of discharge. If he is unable patient will need ambulance transport home.   Confirmed address with daughter : [address] [place], [place] [address]   [place] [phone]

## 2017-04-28 NOTE — Progress Notes (Signed)
PROGRESS NOTE  [name]  DGU:[id number] DOB: 08/29/1931 DOA: 04/23/2017 PCP: Hoyt Koch, MD  Outpatient Specialists: Vaughan Browner, pulmonology Brief Narrative: [name] is an 84 y.o. female with a history of COPD nonadherent to home oxygen, AFib on eliquis s/p PPM, recurrent aspiration PNA, T2DM, and recently diagnosed lung cancer without tissue Dx or Tx thus far who presented to the ED from ALF due to fever and hypoxia in the setting of recent increasing dyspnea and increased sputum production and wheezing in the ED. Influenza swab was positive in the ED without infiltrate on CXR. Nebulizers, steroids, and tamiflu were started and the patient admitted.   Assessment & Plan:  Acute on chronic hypoxic respiratory failure:  -Primarily secondary to influenza A/COPD exacerbation in the background of severe COPD, recurrent aspiration pneumonia and sleep apnea -Clinically improving, has been on IV steroids and completed 5 day course of Tamiflu -On 2/17 she had worsening respiratory distress and cough hence aspiration pneumonia was suspected and she was started on empiric Unasyn -Clinically stable and improving since then, transitioned to oral Augmentin today -Slow prednisone taper- -wean O2 down to 2 L  GOLD D COPD with exacerbation  -due to influenza A:  -PFTs recently showed moderate COPD with severe reduction in DLCO -as above  Chronic dysphagia -last SLP eval as outpatient 2/11 -recommended D2 diet with thickened liquids, questionable compliance with this  Suspected Lung CA -PET + left upper lobe pulmonary nodule: Strongly suggestive of bronchogenic CA in former heavy smoker: Pt and son discussed with pulmonology, Dr. Vaughan Browner as outpatient. He felt risks were prohibitive of bronchoscopic biopsy, CT-guided biopsy, and resection due to severity of comorbidities/frailty. Radiation therapy was considered.  - Dr.Grunz d/w Dr. Jana Hakim. He had agreed to see the patient while in  the hospital since she would miss the appointment 2/19, though with her current status,  -Seen by pulmonary again inpatient as well as palliative medicine, now planned for no further workup for this suspected cancerous lesion in the left lung, plan for hospice at home, and discharge   Chronic kidney disease stage III.  -stable at baseline. - Monitor  Chronic systolic heart failure, ischemic cardiomyopathy:  - clinically compensated - Continue home medications including lasix 40mg  po BID  P.AFib  -s/p failed ablations now s/p PPM: CHA2DS2-VASc is 4.  - Continue eliquis  DM type 2 - HbA1c 7.7%, exacerbated by ongoing steroid use. - Holding home regimen of Glipizide, metformin - SSI  Depression, anxiety, memory impairment:  - Continue SSRI, benzodiazepine, namenda   DVT prophylaxis: Eliquis Code Status: DNR Family Communication: Discussed with daughter [name] Disposition Plan: Plan currently to return to ALF with Hospice services Tomorrow  Consultants: Pulmonology,  palliative care; Dr.Grunz discussed with oncology  Procedures: None Antimicrobials: Tamiflu 2/15 - 2/20;  Unasyn 2/18 >> 2/20,  Augmentin 2/20->  Subjective: -Breathing improving, had palliative meeting this morning -Still having bouts of coughing spells  Objective: Vitals:   04/27/17 2102 04/28/17 0500 04/28/17 0516 04/28/17 1440  BP:   (!) 144/79   Pulse:   71   Resp:   20   Temp:   97.9 F (36.6 C)   TempSrc:   Oral   SpO2: 96%  98% 93%  Weight:  76 kg (167 lb 8.8 oz)    Height:        Intake/Output Summary (Last 24 hours) at 04/28/2017 1507 Last data filed at 04/28/2017 0329 Gross per 24 hour  Intake 320 ml  Output -  Net 320 ml   Filed Weights   04/25/17 0625 04/27/17 0539 04/28/17 0500  Weight: 78.5 kg (173 lb 1 oz) 78 kg (171 lb 15.3 oz) 76 kg (167 lb 8.8 oz)    Gen: Elderly, frail chronically ill female, sitting up in bed no distress, pleasant HEENT: Pupils equal and reactive, no  JVD Lungs: Improved air movement, scattered exp wheezes. Basilar rhonchi CVS: S1-S2/regular rate rhythm Abd: soft, Non tender, non distended, BS present Extremities: No edema, positive clubbing Skin: no new rashes Psych: Judgement and insight appear fair. Mood & affect appropriate.   CBC: Recent Labs  Lab 04/23/17 0929 04/24/17 0339 04/28/17 0430  WBC 9.5 7.6 11.2*  NEUTROABS 6.8  --   --   HGB 13.2 12.6 12.3  HCT 41.3 40.3 38.5  MCV 92.8 93.5 91.0  PLT 181 182 778   Basic Metabolic Panel: Recent Labs  Lab 04/23/17 0929 04/24/17 0339 04/25/17 0352 04/28/17 0430  NA 141 140 142 142  K 4.1 4.6 3.6 3.3*  CL 105 106 106 104  CO2 25 24 24 27   GLUCOSE 106* 197* 155* 134*  BUN 12 14 19  22*  CREATININE 1.21* 1.10* 1.18* 1.18*  CALCIUM 8.5* 8.9 9.0 8.3*   GFR: Estimated Creatinine Clearance: 36.8 mL/min (A) (by C-G formula based on SCr of 1.18 mg/dL (H)). Liver Function Tests: Recent Labs  Lab 04/23/17 0929  AST 20  ALT 14  ALKPHOS 54  BILITOT 0.8  PROT 6.3*  ALBUMIN 2.7*   Recent Labs  Lab 04/23/17 0929  LIPASE 26   HbA1C: No results for input(s): HGBA1C in the last 72 hours. CBG: Recent Labs  Lab 04/27/17 1332 04/27/17 1647 04/27/17 2104 04/28/17 0805 04/28/17 1239  GLUCAP 155* 223* 227* 118* 165*   Urine analysis:    Component Value Date/Time   COLORURINE YELLOW 04/24/2017 0721   APPEARANCEUR CLEAR 04/24/2017 0721   LABSPEC 1.013 04/24/2017 0721   PHURINE 5.0 04/24/2017 0721   GLUCOSEU NEGATIVE 04/24/2017 0721   HGBUR NEGATIVE 04/24/2017 0721   BILIRUBINUR NEGATIVE 04/24/2017 0721   KETONESUR NEGATIVE 04/24/2017 0721   PROTEINUR NEGATIVE 04/24/2017 0721   UROBILINOGEN 0.2 10/24/2014 1433   NITRITE NEGATIVE 04/24/2017 0721   LEUKOCYTESUR NEGATIVE 04/24/2017 0721   Recent Results (from the past 240 hour(s))  Blood Culture (routine x 2)     Status: None   Collection Time: 04/23/17  9:36 AM  Result Value Ref Range Status   Specimen  Description BLOOD LEFT ANTECUBITAL  Final   Special Requests   Final    BOTTLES DRAWN AEROBIC AND ANAEROBIC Blood Culture adequate volume   Culture   Final    NO GROWTH 5 DAYS Performed at [facility], [place] [address], [place], [place] [address]    Report Status 04/28/2017 FINAL  Final  Blood Culture (routine x 2)     Status: None   Collection Time: 04/23/17 12:20 PM  Result Value Ref Range Status   Specimen Description BLOOD RIGHT HAND  Final   Special Requests IN PEDIATRIC BOTTLE Blood Culture adequate volume  Final   Culture   Final    NO GROWTH 5 DAYS Performed at [facility], [place] [address], [place], [place] [address]    Report Status 04/28/2017 FINAL  Final      Radiology Studies: No results found.  Scheduled Meds: . amoxicillin-clavulanate  1 tablet Oral BID WC  . apixaban  5 mg Oral BID  . atorvastatin  20  mg Oral Q2000  . escitalopram  10 mg Oral Daily  . furosemide  40 mg Oral BID  . guaiFENesin  1,200 mg Oral BID  . insulin aspart  0-15 Units Subcutaneous TID WC  . insulin aspart  0-5 Units Subcutaneous QHS  . insulin aspart  3 Units Subcutaneous TID WC  . ipratropium-albuterol  3 mL Nebulization TID  . LORazepam  0.5 mg Oral BID  . memantine  28 mg Oral Daily  . potassium chloride  40 mEq Oral Once  . [START ON 04/29/2017] predniSONE  40 mg Oral Q breakfast  . senna-docusate  1 tablet Oral BID   Continuous Infusions:    LOS: 4 days   Time spent: 25 minutes.  Domenic Polite, MD [facility] Page via [email or web address] password [email or web address]  If 7PM-7AM, please contact night-coverage  04/28/2017, 3:07 PM

## 2017-04-28 NOTE — Progress Notes (Signed)
Spoke with 5 children, husband and patient.   Reviewed the subject of LUL lung nodule.  After discussion centered around the logistics of getting to and from radiation and the fatigue it may cause -  Patient does not want radiation.  Family requested that I review what is included in hospice services.  Patient directly asked for a prognosis.  I honestly explained that while her time is limited, and hospice services is associated with a prognosis of 6 months or less - she has a tremendous spirit that will carry her far.  Family was very appreciative of the information and time.  Patient excitedly anticipating discharge.  Wants to get up and ambulate.  Florentina Jenny, PA-C Palliative Medicine Pager: [phone]  Total time 40 min.

## 2017-04-28 NOTE — Progress Notes (Signed)
[facility] PCCM  S: Slept OK last night, feels OK this morning  O: Vitals:   04/27/17 2014 04/27/17 2102 04/28/17 0500 04/28/17 0516  BP: (!) 119/52   (!) 144/79  Pulse: 77   71  Resp: 17   20  Temp: 97.7 F (36.5 C)   97.9 F (36.6 C)  TempSrc: Oral   Oral  SpO2: 94% 96%  98%  Weight:   167 lb 8.8 oz (76 kg)   Height:       2L  Gen: chronically ill appearing HENT: OP clear, TM's clear, neck supple PULM: some rhonchi more on left than than right CV: RRR, no mgr, trace edema GI: BS+, soft, nontender Derm: no cyanosis or rash Psyche: normal mood and affect  CT chest and PET CT images reviewed: left upper lobe nodule  CXR RLL infiltrate  Impression/plan  Aspiration pneumonia: complete unasyn after 5 days Flu: complete tamiflu after 5 days COPD exacerbation: wean prednisone down to 20mg  daily in 5 days, continue bronchodilators, resume dulera and spiriva at home Lung nodule: I wouldn't treat this, see discussion below  I spent a significant amount of time talking to the patient, her daughter, daughter in law and son this morning as well as palliative care. Based on her overall frail state, the small size of the nodule, the advance level of dyspnea at baseline, and current level of deconditioning and dementia we decided that trying to treat this even with radiation alone would not be medically beneficial.  > 40 minutes spent on this visit, 35 of which were in direct consultation with the patient and family  Roselie Awkward, MD [place] [facility] Pager: [phone] Cell: [phone] After 3pm or if no response, call [phone]

## 2017-04-28 NOTE — Care Management Note (Addendum)
Case Management Note  Patient Details  Name: MANDA HOLSTAD MRN: [id number] Date of Birth: [birth year]  Subjective/Objective:                    Action/Plan:  Spoke with patient and daughter [name] at bedside. Plan is for patient to return to [place] with hospice services. Choice offered . Both prefer Hospice and [place].   Patient already has home oxygen , hospital bed, motorized wheelchair, walkers at home through [place].   She would like 3 in 1.   PCP is Miguel Aschoff NP through [employer].   Daughter states she has already spoken to [facility].  If patient discharges today daughter can transport her mother home, however daughter cannot if discharge is tomorrow.   [facility] with Manuela Schwartz was transferred to [facility]  left [employer]. Expected Discharge Date:                  Expected Discharge Plan:  Home w Hospice Care  In-House Referral:     Discharge planning Services  CM Consult  Post Acute Care Choice:    Choice offered to:  Patient, Adult Children  DME Arranged:  3-N-1 DME Agency:  Hospice and [place]:  NA HH Agency:  [facility]  Status of Service:  In process, will continue to follow  If discussed at Long Length of Stay Meetings, dates discussed:    Additional Comments:  Marilu Favre, RN 04/28/2017, 11:03 AM

## 2017-04-29 LAB — GLUCOSE, CAPILLARY
Glucose-Capillary: 240 mg/dL — ABNORMAL HIGH (ref 65–99)
Glucose-Capillary: 89 mg/dL (ref 65–99)
Glucose-Capillary: 152 mg/dL — ABNORMAL HIGH (ref 65–99)

## 2017-04-29 MED ORDER — PREDNISONE 20 MG PO TABS: 20.0000 mg | ORAL_TABLET | Freq: Every day | ORAL | 0 refills | Status: DC

## 2017-04-29 MED ORDER — PREDNISONE 10 MG PO TABS: 10.0000 mg | ORAL_TABLET | Freq: Every day | ORAL | 0 refills | Status: AC

## 2017-04-29 MED ORDER — AMOXICILLIN-POT CLAVULANATE 875-125 MG PO TABS: 1.0000 | ORAL_TABLET | Freq: Two times a day (BID) | ORAL | 0 refills | Status: DC

## 2017-04-29 NOTE — Care Management (Addendum)
FL2 and dc summary sent. [name] has approved patient to return.    Charity [phone]) from [place] returned call , requesting discharge summary and FL2 faxed to her [phone] , before approval for patient to return. Same faxed.  Magdalen Spatz RN BSN [phone]

## 2017-04-29 NOTE — Progress Notes (Signed)
[name] to be D/C'd  per MD order. Discussed with the patient and all questions fully answered.  VSS, Skin clean, dry and intact without evidence of skin break down, no evidence of skin tears noted.  IV catheter discontinued intact. Site without signs and symptoms of complications. Dressing and pressure applied.  An After Visit Summary was printed and given to the patient. Patient received prescription.  D/c education completed with patient/family including follow up instructions, medication list, d/c activities limitations if indicated, with other d/c instructions as indicated by MD - patient able to verbalize understanding, all questions fully answered.   Patient instructed to return to ED, call 911, or call MD for any changes in condition.   Patient to be escorted via [place], and D/C to [place] facility by son Inocente Salles.

## 2017-04-29 NOTE — Plan of Care (Signed)
  Education: Knowledge of General Education information will improve 04/29/2017 0424 - Progressing by Anson Fret, RN Note POC reviewed with pt.

## 2017-04-29 NOTE — Progress Notes (Signed)
Attempted report to [facility] at [employer], went to voicemail, told her to return my call if she wanted report.

## 2017-04-29 NOTE — Progress Notes (Signed)
[facility] PCCM  S: feels OK  O:  Vitals:   04/28/17 1527 04/28/17 2112 04/28/17 2221 04/29/17 0935  BP: (!) 106/51  (!) 110/55   Pulse: 73  72   Resp: 20  19   Temp: 98 F (36.7 C)  98.3 F (36.8 C)   TempSrc: Oral     SpO2: 94% 92% 93% 94%  Weight:      Height:       General:  Chronically ill appearing resting comfortably in bed HENT: NCAT OP clear PULM: Poor air movement but breathing comfortably CV: RRR, no mgr GI: BS+, soft, nontender MSK: normal bulk and tone Neuro: awake, alert, no distress, MAEW  Impression: Severe COPD Chronic respiratory failure with hypoxemia Flu Lung nodule Poor prognosis  Plan: Wean down prednisone to 20mg  in the next 4 days Continue Spiriva and Dulera at home Agree with home hospice  PCCM will sign off  Roselie Awkward, MD [place] PCCM Pager: [phone] Cell: [phone] After 3pm or if no response, call [phone]

## 2017-04-29 NOTE — Discharge Summary (Signed)
Physician Discharge Summary  [name] BJS:[id number] DOB: [birth year] DOA: 04/23/2017  PCP: Hoyt Koch, MD  Admit date: 04/23/2017 Discharge date: 04/29/2017  Time spent: 35 minutes  Recommendations for Outpatient Follow-up:  1. Back home to [place] with Hospice services 2. PCP in 1 week or as needed   Discharge Diagnoses:  Principal Problem:   Acute on chronic respiratory failure with hypoxia (HCC) Active Problems:   Controlled diabetes mellitus type 2 with complications (HCC)   Obstructive sleep apnea   [name] (coronary artery disease)   Atrial fibrillation (HCC)   PACEMAKER, PERMANENT   Gold stage C. COPD with frequent exacerbations   Diastolic CHF, chronic (HCC)   GERD (gastroesophageal reflux disease)   Primary cancer of left upper lobe of lung (HCC)   Aspiration pneumonia   Influenza A   CKD (chronic kidney disease) stage 3, GFR 30-59 ml/min (HCC)   COPD exacerbation (HCC)   Severe comorbid illness   Palliative care encounter   Pressure injury of skin   Discharge Condition: improved  Diet recommendation: Dysphagia 2 -low sodium/diabetic or Comfort feeds per Pt preference   Filed Weights   04/25/17 0625 04/27/17 0539 04/28/17 0500  Weight: 78.5 kg (173 lb 1 oz) 78 kg (171 lb 15.3 oz) 76 kg (167 lb 8.8 oz)    History of present illness:  [name] is an 84 y.o. female with a history of COPD nonadherent to home oxygen, AFib on eliquis s/p PPM, recurrent aspiration PNA, T2DM, and recently diagnosed lung cancer without tissue Dx or Tx thus far who presented to the ED from ALF due to fever and hypoxia in the setting of recent increasing dyspnea and increased sputum production and wheezing in the ED  Hospital Course:   Acute on chronic hypoxic respiratory failure:  -Primarily secondary to influenza A/COPD exacerbation in the background of severe COPD, recurrent aspiration pneumonia and sleep apnea -Clinically improving, has been on IV steroids  and completed 5 day course of Tamiflu -On 2/17 she had worsening respiratory distress and cough hence aspiration pneumonia was suspected and she was started on empiric Unasyn -Clinically stable and improving since then, transitioned to oral Augmentin, continue for 1 more day -Slow prednisone taper-down to 10mg  daily -weaned O2 down to 2 L -also s/p Palliative meeting due to extensive list of co-morbidities, now plan for Home with Hospice services  GOLD C COPD with frequent exacerbations  -due to influenza A:  -PFTs recently showed moderate COPD with severe reduction in DLCO -as above  Chronic dysphagia -last SLP eval as outpatient 2/11 -recommended D2 diet with thickened liquids, questionable compliance with this, now comfort feeds preferably Dysphagia 2   Suspected Lung CA -PET + left upper lobe pulmonary nodule: Strongly suggestive of bronchogenic CA in former heavy smoker: Pt and son discussed with pulmonology, Dr. Vaughan Browner as outpatient. He felt risks were prohibitive of bronchoscopic biopsy, CT-guided biopsy, and resection due to severity of comorbidities/frailty. Radiation therapy was considered.  -Seen by pulmonary again inpatient as well as palliative medicine, now planned for no further workup for this suspected cancerous lesion in the left lung, plan for hospice at home, and discharge  back to [facility]  Chronic kidney disease stage III.  -stable at baseline. - Monitor  Chronic systolic heart failure, ischemic cardiomyopathy:  - clinically compensated - Continue home medications including lasix 40mg  po BID  P.AFib  -s/p failed ablations now s/p PPM: CHA2DS2-VASc is 4.  - Continue eliquis  DM type  2 - HbA1c 7.7%, exacerbated by ongoing steroid use. - resume  home regimen of Glipizide, metformin  Depression, anxiety, memory impairment:  - Continue SSRI, benzodiazepine, namenda   Code Status: DNR  Consultants: Pulmonology,  palliative care; Dr.Grunz  discussed with oncology    Discharge Exam: Vitals:   04/28/17 2221 04/29/17 0935  BP: (!) 110/55   Pulse: 72   Resp: 19   Temp: 98.3 F (36.8 C)   SpO2: 93% 94%    General: AAOx2 Cardiovascular: SS2/RRR Respiratory: improved air movement, scattered ronchi  Discharge Instructions   Discharge Instructions    Diet - low sodium heart healthy   Complete by:  As directed    Diet Carb Modified   Complete by:  As directed    Increase activity slowly   Complete by:  As directed      Allergies as of 04/29/2017      Reactions   Cholestatin Other (See Comments)   RAGWEED SEASON...sneezing   Sulfur Itching      Medication List    STOP taking these medications   Fluticasone-Salmeterol 250-50 MCG/DOSE Aepb Commonly known as:  ADVAIR DISKUS   levalbuterol 45 MCG/ACT inhaler Commonly known as:  XOPENEX HFA   ROBAFEN DM 10-100 MG/5ML liquid Generic drug:  Dextromethorphan-guaiFENesin     TAKE these medications   acetaminophen 325 MG tablet Commonly known as:  TYLENOL Take 650 mg by mouth every 6 (six) hours as needed for mild pain or fever.   amoxicillin-clavulanate 875-125 MG tablet Commonly known as:  AUGMENTIN Take 1 tablet by mouth 2 (two) times daily with a meal. For 1day   atorvastatin 20 MG tablet Commonly known as:  LIPITOR TAKE 1 TABLET EVERY DAY   B-D ULTRA-FINE 33 LANCETS Misc Use to help check blood sugars daily Dx E11.9   benzonatate 200 MG capsule Commonly known as:  TESSALON Take 1 capsule (200 mg total) by mouth 2 (two) times daily as needed for cough.   chlorpheniramine-HYDROcodone 10-8 MG/5ML Suer Commonly known as:  TUSSIONEX PENNKINETIC ER Take 5 mLs by mouth 2 (two) times daily.   DULERA 200-5 MCG/ACT Aero Generic drug:  mometasone-formoterol Inhale 1 puff into the lungs 2 (two) times daily.   ELIQUIS 5 MG Tabs tablet Generic drug:  apixaban TAKE 1 TABLET TWICE DAILY  (MUST  ESTABLISH  NEW  PRIMARY CARE PROVIDER  FOR FUTURE  REFILLS)   escitalopram 10 MG tablet Commonly known as:  LEXAPRO   FLUTTER Devi Use as directed   furosemide 40 MG tablet Commonly known as:  LASIX Take 1 tablet (40 mg total) by mouth 2 (two) times daily.   glipiZIDE 5 MG tablet Commonly known as:  GLUCOTROL Take 5 mg by mouth daily before breakfast.   glucose blood test strip Commonly known as:  BAYER CONTOUR TEST 1 each by Other route daily. Use to check blood sugars every day Dx E11.9   levalbuterol 0.63 MG/3ML nebulizer solution Commonly known as:  XOPENEX Take 0.63 mg by nebulization daily as needed for wheezing or shortness of breath.   LORazepam 0.5 MG tablet Commonly known as:  ATIVAN Take 0.5 mg by mouth 2 (two) times daily.   memantine 28 MG Cp24 24 hr capsule Commonly known as:  NAMENDA XR Take 1 capsule (28 mg total) by mouth daily.   metFORMIN 500 MG 24 hr tablet Commonly known as:  GLUCOPHAGE XR Take 1 tablet (500 mg total) by mouth daily with breakfast.   MUCINEX 600 MG 12  hr tablet Generic drug:  guaiFENesin TAKE ONE TABLET TWICE DAILY   OXYGEN Inhale 2 L into the lungs.   polyethylene glycol packet Commonly known as:  MIRALAX / GLYCOLAX Take 17 g by mouth 2 (two) times daily.   potassium chloride SA 20 MEQ tablet Commonly known as:  K-DUR,KLOR-CON Take 1 tablet (20 mEq total) by mouth 2 (two) times daily.   predniSONE 20 MG tablet Commonly known as:  DELTASONE Take 1-2 tablets (20-40 mg total) by mouth daily with breakfast. Take 40mg  daily for 2days then 20mg  daily for 2days then 10mg  daily thereafter Start taking on:  04/30/2017 What changed:    how much to take  additional instructions   predniSONE 10 MG tablet Commonly known as:  DELTASONE Take 1 tablet (10 mg total) by mouth daily with breakfast. Start taking on:  05/03/2017 What changed:  These instructions start on 05/03/2017. If you are unsure what to do until then, ask your doctor or other care provider.   senna-docusate  8.6-50 MG tablet Commonly known as:  Senokot-S Take 1 tablet by mouth 2 (two) times daily.   SPIRIVA HANDIHALER 18 MCG inhalation capsule Generic drug:  tiotropium INHALE THE CONTENTS OF 1 CAPSULE EVERY DAY      Allergies  Allergen Reactions  . Cholestatin Other (See Comments)    RAGWEED SEASON...sneezing   . Sulfur Itching   Follow-up Information    Hoyt Koch, MD Follow up in 1 week(s).   Specialty:  Internal Medicine Why:  as needed Contact information: [place] [address] [phone]            The results of significant diagnostics from this hospitalization (including imaging, microbiology, ancillary and laboratory) are listed below for reference.    Significant Diagnostic Studies: Nm Pet Image Initial (pi) Skull Base To Thigh  Result Date: 04/07/2017 CLINICAL DATA:  Initial treatment strategy for enlarging apical left upper lobe pulmonary nodule. EXAM: NUCLEAR MEDICINE PET SKULL BASE TO THIGH TECHNIQUE: 8.4 mCi F-18 FDG was injected intravenously. Full-ring PET imaging was performed from the skull base to thigh after the radiotracer. CT data was obtained and used for attenuation correction and anatomic localization. Mediastinal Blood Pool Activity (max SUV): 3.6 FASTING BLOOD GLUCOSE:  Value: 94 mg/dl COMPARISON:  03/17/2017 chest CT. FINDINGS: NECK: No hypermetabolic lymph nodes in the neck. Partial opacification of the dependent portions of maxillary sinuses. CHEST: Irregular solid hypermetabolic 2.0 x 1.6 cm posterior apical left upper lobe pulmonary nodule (series 8/image 7) with max SUV 10.5, minimally increased from 1.9 x 1.5 cm on 03/17/2017 chest CT. This nodule is new since 07/14/2010 chest CT. Separate 0.5 cm left upper lobe pulmonary nodule (series 8/image 22) is below PET resolution and stable. No acute consolidative airspace disease, lung masses or additional significant pulmonary nodules. No hypermetabolic axillary, mediastinal  or hilar lymph nodes. Mild cardiomegaly. Two lead left subclavian pacemaker is noted with lead tips in the right atrium and right ventricular apex. Coronary atherosclerosis. Ectatic 4.2 cm atherosclerotic ascending thoracic aorta, stable. Moderate centrilobular emphysema. ABDOMEN/PELVIS: No abnormal hypermetabolic activity within the liver, pancreas, adrenal glands, or spleen. No hypermetabolic lymph nodes in the abdomen or pelvis. Nonobstructing 3 mm interpolar right renal stone. Nonobstructing 2 mm lower left renal stone. Atherosclerotic abdominal aorta with 3.7 cm suprarenal abdominal aortic aneurysm, stable. Mild sigmoid diverticulosis. SKELETON: Left total hip arthroplasty. Surgical plate and screws noted in posterior left acetabulum. Degenerative FDG uptake in the bilateral glenohumeral joints. Mild focal  hypermetabolism within the anterior right fifth, sixth, seventh, eighth and ninth ribs with the suggestion of subacute healing fractures in these locations on the CT images. Otherwise no focal skeletal hypermetabolism. IMPRESSION: 1. Hypermetabolic (max SUV 02.4) apical left upper lobe 2.0 cm pulmonary nodule, most compatible with primary bronchogenic carcinoma. This nodule is new since a 07/14/2010 chest CT. 2. Separate 0.5 cm left upper lobe pulmonary nodule, below PET resolution, recommend attention on follow-up chest CT in 3 months. 3. No hypermetabolic thoracic adenopathy or distant metastatic disease. 4. Mild focal hypermetabolism in the anterior right fifth through ninth ribs at the sites of subacute healing fractures, probably posttraumatic, correlate with injury history. 5. Chronic findings include: Aortic Atherosclerosis (ICD10-I70.0). Ectatic 4.2 cm ascending thoracic aorta. Stable 3.7 cm suprarenal abdominal aortic aneurysm. Nonobstructing bilateral nephrolithiasis. Coronary atherosclerosis. Mild sigmoid diverticulosis. Electronically Signed   By: Ilona Sorrel M.D.   On: 04/07/2017 12:42   Dg  Chest Port 1 View  Result Date: 04/26/2017 CLINICAL DATA:  Shortness of breath EXAM: PORTABLE CHEST 1 VIEW COMPARISON:  04/23/2017 FINDINGS: The lungs are clear without focal pneumonia, edema, pneumothorax or pleural effusion. Interstitial markings are diffusely coarsened with chronic features. The cardio pericardial silhouette is enlarged. Left permanent pacemaker again noted. The visualized bony structures of the thorax are intact. IMPRESSION: Stable exam.  Cardiomegaly with chronic interstitial coarsening. Electronically Signed   By: Misty Stanley M.D.   On: 04/26/2017 09:26   Dg Chest Port 1 View  Result Date: 04/23/2017 CLINICAL DATA:  Cough and hypoxia EXAM: PORTABLE CHEST 1 VIEW COMPARISON:  PET CT 04/07/2017.  Chest x-ray 02/21/2017. FINDINGS: Interstitial coarsening that is chronic. Mild atelectatic change or scarring at the right base. No definite consolidation. Cardiomegaly. Dual-chamber pacer leads from the left. Known left upper lobe pulmonary nodule that is occult. IMPRESSION: COPD.  No acute finding when compared to prior. Electronically Signed   By: Monte Fantasia M.D.   On: 04/23/2017 09:39    Microbiology: Recent Results (from the past 240 hour(s))  Blood Culture (routine x 2)     Status: None   Collection Time: 04/23/17  9:36 AM  Result Value Ref Range Status   Specimen Description BLOOD LEFT ANTECUBITAL  Final   Special Requests   Final    BOTTLES DRAWN AEROBIC AND ANAEROBIC Blood Culture adequate volume   Culture   Final    NO GROWTH 5 DAYS Performed at [facility], [address] [place], [place] [address]    Report Status 04/28/2017 FINAL  Final  Blood Culture (routine x 2)     Status: None   Collection Time: 04/23/17 12:20 PM  Result Value Ref Range Status   Specimen Description BLOOD RIGHT HAND  Final   Special Requests IN PEDIATRIC BOTTLE Blood Culture adequate volume  Final   Culture   Final    NO GROWTH 5 DAYS Performed at [facility],  [place] [address] [place], [place] [address]    Report Status 04/28/2017 FINAL  Final     Labs: Basic Metabolic Panel: Recent Labs  Lab 04/23/17 0929 04/24/17 0339 04/25/17 0352 04/28/17 0430  NA 141 140 142 142  K 4.1 4.6 3.6 3.3*  CL 105 106 106 104  CO2 25 24 24 27   GLUCOSE 106* 197* 155* 134*  BUN 12 14 19  22*  CREATININE 1.21* 1.10* 1.18* 1.18*  CALCIUM 8.5* 8.9 9.0 8.3*   Liver Function Tests: Recent Labs  Lab 04/23/17 0929  AST  20  ALT 14  ALKPHOS 54  BILITOT 0.8  PROT 6.3*  ALBUMIN 2.7*   Recent Labs  Lab 04/23/17 0929  LIPASE 26   No results for input(s): AMMONIA in the last 168 hours. CBC: Recent Labs  Lab 04/23/17 0929 04/24/17 0339 04/28/17 0430  WBC 9.5 7.6 11.2*  NEUTROABS 6.8  --   --   HGB 13.2 12.6 12.3  HCT 41.3 40.3 38.5  MCV 92.8 93.5 91.0  PLT 181 182 230   Cardiac Enzymes: No results for input(s): CKTOTAL, CKMB, CKMBINDEX, TROPONINI in the last 168 hours. BNP: BNP (last 3 results) Recent Labs    11/28/16 1803 02/21/17 2003 04/23/17 0929  BNP 156.7* 295.4* 275.0*    ProBNP (last 3 results) No results for input(s): PROBNP in the last 8760 hours.  CBG: Recent Labs  Lab 04/28/17 0805 04/28/17 1239 04/28/17 1635 04/28/17 2219 04/29/17 0801  GLUCAP 118* 165* 275* 240* 89       Signed:  Domenic Polite MD.  [facility] 04/29/2017, 10:40 AM

## 2017-04-29 NOTE — Care Management (Signed)
Called [facility] again spoke with Almetta Lovely aware patient returning with hospice , transferred to [facility] again, left message awaiting call back.   DNR gold form signed.  Bedside nurse spoke with son Mikeal Hawthorne who is planning on transporting patient home at 2pm and has oxygen.   Jenn at [facility] and [facility] aware.   Magdalen Spatz RN BSN [phone]

## 2017-04-30 ENCOUNTER — Ambulatory Visit (HOSPITAL_COMMUNITY)
Admission: RE | Admit: 2017-04-30 | Discharge: 2017-04-30 | Disposition: A | Discharge status: Home / Self Care | Payer: Medicare Other | Source: Ambulatory Visit | Attending: Pulmonary Disease | Admitting: Pulmonary Disease

## 2017-04-30 ENCOUNTER — Telehealth: Payer: Self-pay

## 2017-04-30 ENCOUNTER — Ambulatory Visit (HOSPITAL_COMMUNITY): Admit: 2017-04-30 | Payer: Medicare Other

## 2017-04-30 DIAGNOSIS — R1312 Dysphagia, oropharyngeal phase: Secondary | ICD-10-CM

## 2017-04-30 NOTE — Telephone Encounter (Signed)
Pt is on TCM list after hospital admission with Flu A and COPD exacerbation.   Called pt dtr ([name]) and she informed that pt is now with Tora Perches, Hospice and Doctor [employer].   Pt dtr expressed thanks for calling. Sharlet Salina has been removed from PCP list.

## 2017-05-03 ENCOUNTER — Encounter: Payer: Self-pay | Admitting: Speech Pathology

## 2017-05-03 DIAGNOSIS — J111 Influenza due to unidentified influenza virus with other respiratory manifestations: Secondary | ICD-10-CM | POA: Diagnosis not present

## 2017-05-03 DIAGNOSIS — I4891 Unspecified atrial fibrillation: Secondary | ICD-10-CM | POA: Diagnosis not present

## 2017-05-03 DIAGNOSIS — J449 Chronic obstructive pulmonary disease, unspecified: Secondary | ICD-10-CM | POA: Diagnosis not present

## 2017-05-03 DIAGNOSIS — C3490 Malignant neoplasm of unspecified part of unspecified bronchus or lung: Secondary | ICD-10-CM | POA: Diagnosis not present

## 2017-05-03 DIAGNOSIS — J9620 Acute and chronic respiratory failure, unspecified whether with hypoxia or hypercapnia: Secondary | ICD-10-CM | POA: Diagnosis not present

## 2017-05-03 NOTE — Therapy (Signed)
SLP Long Term Goals - 05/03/17 1450      SLP LONG TERM GOAL #1   Title  Pt will participate in MBS to determine safest, least restrictive diet and identify compensatory strategies to improve swallow function.    Status  Not Met      SLP LONG TERM GOAL #2   Title  Pt will demo swallow precautions with modified independence.    Status  Not Met       SPEECH THERAPY DISCHARGE SUMMARY  Visits from Start of Care: 1  Current functional level related to goals / functional outcomes: See goals above. See evaluation for functional level; pt did not return for outpatient therapy and is now receiving hospice services.   Remaining deficits: See evaluation for details.   Education / Equipment: Aspiration precautions  Plan: Patient agrees to discharge.  Patient goals were not met. Patient is being discharged due to a change in medical status.  ?????         Deneise Lever, MS, [facility] [address] [place], [place], [address] Phone: [phone]   Fax:  [phone]  Patient Details  Name: [name] MRN: [id number] Date of Birth: 02/09/1932 Referring Provider:  No ref. provider found  Encounter Date: 05/03/2017   [name] 05/03/2017, 2:50 PM  [facility] [address] [place], [place], [address] Phone: [phone]   Fax:  [phone]

## 2017-05-04 DIAGNOSIS — F0391 Unspecified dementia with behavioral disturbance: Secondary | ICD-10-CM | POA: Diagnosis not present

## 2017-05-10 DIAGNOSIS — F331 Major depressive disorder, recurrent, moderate: Secondary | ICD-10-CM | POA: Diagnosis not present

## 2017-05-10 DIAGNOSIS — F411 Generalized anxiety disorder: Secondary | ICD-10-CM | POA: Diagnosis not present

## 2017-05-10 DIAGNOSIS — Z79899 Other long term (current) drug therapy: Secondary | ICD-10-CM | POA: Diagnosis not present

## 2017-05-10 DIAGNOSIS — J449 Chronic obstructive pulmonary disease, unspecified: Secondary | ICD-10-CM | POA: Diagnosis not present

## 2017-05-10 DIAGNOSIS — E119 Type 2 diabetes mellitus without complications: Secondary | ICD-10-CM | POA: Diagnosis not present

## 2017-05-10 DIAGNOSIS — G308 Other Alzheimer's disease: Secondary | ICD-10-CM | POA: Diagnosis not present

## 2017-05-13 ENCOUNTER — Encounter: Payer: Medicare Other | Admitting: Speech Pathology

## 2017-05-13 DIAGNOSIS — F411 Generalized anxiety disorder: Secondary | ICD-10-CM | POA: Diagnosis not present

## 2017-05-13 DIAGNOSIS — F331 Major depressive disorder, recurrent, moderate: Secondary | ICD-10-CM | POA: Diagnosis not present

## 2017-05-17 DIAGNOSIS — J441 Chronic obstructive pulmonary disease with (acute) exacerbation: Secondary | ICD-10-CM | POA: Diagnosis not present

## 2017-05-17 DIAGNOSIS — R1319 Other dysphagia: Secondary | ICD-10-CM | POA: Diagnosis not present

## 2017-05-17 DIAGNOSIS — R05 Cough: Secondary | ICD-10-CM | POA: Diagnosis not present

## 2017-05-17 DIAGNOSIS — R251 Tremor, unspecified: Secondary | ICD-10-CM | POA: Diagnosis not present

## 2017-05-17 DIAGNOSIS — C3490 Malignant neoplasm of unspecified part of unspecified bronchus or lung: Secondary | ICD-10-CM | POA: Diagnosis not present

## 2017-05-17 DIAGNOSIS — J690 Pneumonitis due to inhalation of food and vomit: Secondary | ICD-10-CM | POA: Diagnosis not present

## 2017-05-18 ENCOUNTER — Ambulatory Visit: Payer: Medicare Other | Admitting: Pulmonary Disease

## 2017-05-18 DIAGNOSIS — F0391 Unspecified dementia with behavioral disturbance: Secondary | ICD-10-CM | POA: Diagnosis not present

## 2017-05-19 DIAGNOSIS — F411 Generalized anxiety disorder: Secondary | ICD-10-CM | POA: Diagnosis not present

## 2017-05-19 DIAGNOSIS — F3289 Other specified depressive episodes: Secondary | ICD-10-CM | POA: Diagnosis not present

## 2017-05-19 DIAGNOSIS — F4321 Adjustment disorder with depressed mood: Secondary | ICD-10-CM | POA: Diagnosis not present

## 2017-05-19 DIAGNOSIS — F331 Major depressive disorder, recurrent, moderate: Secondary | ICD-10-CM | POA: Diagnosis not present

## 2017-05-19 DIAGNOSIS — G308 Other Alzheimer's disease: Secondary | ICD-10-CM | POA: Diagnosis not present

## 2017-05-27 DIAGNOSIS — F411 Generalized anxiety disorder: Secondary | ICD-10-CM | POA: Diagnosis not present

## 2017-05-27 DIAGNOSIS — F331 Major depressive disorder, recurrent, moderate: Secondary | ICD-10-CM | POA: Diagnosis not present

## 2017-05-31 DIAGNOSIS — M25519 Pain in unspecified shoulder: Secondary | ICD-10-CM | POA: Diagnosis not present

## 2017-05-31 DIAGNOSIS — E669 Obesity, unspecified: Secondary | ICD-10-CM | POA: Diagnosis not present

## 2017-05-31 DIAGNOSIS — I482 Chronic atrial fibrillation: Secondary | ICD-10-CM | POA: Diagnosis not present

## 2017-05-31 DIAGNOSIS — F0390 Unspecified dementia without behavioral disturbance: Secondary | ICD-10-CM | POA: Diagnosis not present

## 2017-05-31 DIAGNOSIS — C3490 Malignant neoplasm of unspecified part of unspecified bronchus or lung: Secondary | ICD-10-CM | POA: Diagnosis not present

## 2017-05-31 DIAGNOSIS — J449 Chronic obstructive pulmonary disease, unspecified: Secondary | ICD-10-CM | POA: Diagnosis not present

## 2017-06-01 DIAGNOSIS — F0391 Unspecified dementia with behavioral disturbance: Secondary | ICD-10-CM | POA: Diagnosis not present

## 2017-06-01 DIAGNOSIS — Z79899 Other long term (current) drug therapy: Secondary | ICD-10-CM | POA: Diagnosis not present

## 2017-06-03 DIAGNOSIS — F331 Major depressive disorder, recurrent, moderate: Secondary | ICD-10-CM | POA: Diagnosis not present

## 2017-06-03 DIAGNOSIS — F411 Generalized anxiety disorder: Secondary | ICD-10-CM | POA: Diagnosis not present

## 2017-06-07 ENCOUNTER — Ambulatory Visit (HOSPITAL_COMMUNITY)
Admission: RE | Admit: 2017-06-07 | Discharge: 2017-06-07 | Disposition: A | Discharge status: Home / Self Care | Payer: Medicare Other | Source: Ambulatory Visit | Attending: Oncology | Admitting: Oncology

## 2017-06-07 ENCOUNTER — Inpatient Hospital Stay: Payer: Medicare Other | Attending: Oncology | Admitting: Oncology

## 2017-06-07 ENCOUNTER — Telehealth: Payer: Self-pay | Admitting: Oncology

## 2017-06-07 ENCOUNTER — Inpatient Hospital Stay: Payer: Medicare Other

## 2017-06-07 ENCOUNTER — Encounter: Payer: Self-pay | Admitting: Oncology

## 2017-06-07 VITALS — HR 78 | Temp 98.2°F | Resp 18 | Ht 65.0 in | Wt 177.6 lb

## 2017-06-07 DIAGNOSIS — M24811 Other specific joint derangements of right shoulder, not elsewhere classified: Secondary | ICD-10-CM | POA: Insufficient documentation

## 2017-06-07 DIAGNOSIS — R911 Solitary pulmonary nodule: Secondary | ICD-10-CM | POA: Insufficient documentation

## 2017-06-07 DIAGNOSIS — J439 Emphysema, unspecified: Secondary | ICD-10-CM | POA: Insufficient documentation

## 2017-06-07 DIAGNOSIS — X58XXXA Exposure to other specified factors, initial encounter: Secondary | ICD-10-CM | POA: Insufficient documentation

## 2017-06-07 DIAGNOSIS — M810 Age-related osteoporosis without current pathological fracture: Secondary | ICD-10-CM | POA: Diagnosis not present

## 2017-06-07 DIAGNOSIS — C50212 Malignant neoplasm of upper-inner quadrant of left female breast: Secondary | ICD-10-CM | POA: Diagnosis not present

## 2017-06-07 DIAGNOSIS — C3412 Malignant neoplasm of upper lobe, left bronchus or lung: Secondary | ICD-10-CM | POA: Diagnosis not present

## 2017-06-07 DIAGNOSIS — Z853 Personal history of malignant neoplasm of breast: Secondary | ICD-10-CM

## 2017-06-07 DIAGNOSIS — Z170 Estrogen receptor positive status [ER+]: Secondary | ICD-10-CM | POA: Diagnosis not present

## 2017-06-07 DIAGNOSIS — Z9981 Dependence on supplemental oxygen: Secondary | ICD-10-CM | POA: Insufficient documentation

## 2017-06-07 DIAGNOSIS — M25511 Pain in right shoulder: Secondary | ICD-10-CM

## 2017-06-07 DIAGNOSIS — I700 Atherosclerosis of aorta: Secondary | ICD-10-CM | POA: Diagnosis not present

## 2017-06-07 DIAGNOSIS — I5032 Chronic diastolic (congestive) heart failure: Secondary | ICD-10-CM

## 2017-06-07 DIAGNOSIS — Z7189 Other specified counseling: Secondary | ICD-10-CM

## 2017-06-07 DIAGNOSIS — S42141A Displaced fracture of glenoid cavity of scapula, right shoulder, initial encounter for closed fracture: Secondary | ICD-10-CM | POA: Insufficient documentation

## 2017-06-07 DIAGNOSIS — C50912 Malignant neoplasm of unspecified site of left female breast: Secondary | ICD-10-CM | POA: Insufficient documentation

## 2017-06-07 DIAGNOSIS — J441 Chronic obstructive pulmonary disease with (acute) exacerbation: Secondary | ICD-10-CM | POA: Diagnosis not present

## 2017-06-07 DIAGNOSIS — Z66 Do not resuscitate: Secondary | ICD-10-CM | POA: Diagnosis not present

## 2017-06-07 DIAGNOSIS — Z87891 Personal history of nicotine dependence: Secondary | ICD-10-CM

## 2017-06-07 DIAGNOSIS — M79621 Pain in right upper arm: Secondary | ICD-10-CM | POA: Diagnosis not present

## 2017-06-07 LAB — CBC WITH DIFFERENTIAL/PLATELET
BASOS ABS: 0.1 10*3/uL (ref 0.0–0.1)
BASOS PCT: 1 %
Eosinophils Absolute: 0 10*3/uL (ref 0.0–0.5)
Eosinophils Relative: 0 %
HEMATOCRIT: 40.1 % (ref 34.8–46.6)
HEMOGLOBIN: 12.7 g/dL (ref 11.6–15.9)
Lymphocytes Relative: 9 %
Lymphs Abs: 1.1 10*3/uL (ref 0.9–3.3)
MCH: 28.3 pg (ref 25.1–34.0)
MCHC: 31.6 g/dL (ref 31.5–36.0)
MCV: 89.6 fL (ref 79.5–101.0)
MONO ABS: 0.6 10*3/uL (ref 0.1–0.9)
MONOS PCT: 5 %
NEUTROS ABS: 10.9 10*3/uL — AB (ref 1.5–6.5)
NEUTROS PCT: 85 %
Platelets: 238 10*3/uL (ref 145–400)
RBC: 4.48 MIL/uL (ref 3.70–5.45)
RDW: 18.3 % — AB (ref 11.2–14.5)
WBC: 12.9 10*3/uL — ABNORMAL HIGH (ref 3.9–10.3)

## 2017-06-07 LAB — COMPREHENSIVE METABOLIC PANEL
ALK PHOS: 59 U/L (ref 40–150)
ALT: 12 U/L (ref 0–55)
ANION GAP: 10 (ref 3–11)
AST: 13 U/L (ref 5–34)
Albumin: 3.2 g/dL — ABNORMAL LOW (ref 3.5–5.0)
BILIRUBIN TOTAL: 0.3 mg/dL (ref 0.2–1.2)
BUN: 20 mg/dL (ref 7–26)
CALCIUM: 9.5 mg/dL (ref 8.4–10.4)
CO2: 27 mmol/L (ref 22–29)
CREATININE: 1.17 mg/dL — AB (ref 0.60–1.10)
Chloride: 105 mmol/L (ref 98–109)
GFR, EST AFRICAN AMERICAN: 49 mL/min — AB (ref >60)
GFR, EST NON AFRICAN AMERICAN: 42 mL/min — AB (ref >60)
Glucose, Bld: 190 mg/dL — ABNORMAL HIGH (ref 70–140)
Potassium: 4 mmol/L (ref 3.5–5.1)
SODIUM: 142 mmol/L (ref 136–145)
TOTAL PROTEIN: 7.1 g/dL (ref 6.4–8.3)

## 2017-06-07 NOTE — Telephone Encounter (Signed)
Gave patient AVs and calendar of upcoming April appointments.  °

## 2017-06-07 NOTE — Progress Notes (Signed)
ID: [name]   DOB: 04-Nov-1931  MR#: [id number]  CSN#:[id number]  Patient Care Team: Tiffany Jacks, MD (Ophthalmology) Tiffany Sprang, MD (Cardiology) Tiffany Confer, MD (General Surgery) [name], Tiffany Dad, MD (Hematology and Oncology) Deterding, Tiffany Rinks, MD (Nephrology) Tiffany Nearing, MD (Dermatology) Tiffany Spates, MD (Gastroenterology) Tiffany Balls, MD (Orthopedic Surgery) Tiffany Garfinkel, MD as Consulting Physician (Pulmonary Disease)  CHIEF COMPLAINT: left breast cancer   CURRENT TREATMENT:  Right lung nodule  BREAST CANCER HISTORY: From the original intake note:  [name] had screening mammography September 29th 2011 which showed possible masses in both breasts (the prior mammogram that I have is from October of 2008).  She was recalled for further views on October 11th.  In the right breast, there was an oval circumscribed nodule in the anterior lower outer quadrant which by ultrasound was felt possibly to represent either an intraductal lesion or debris in a dilated cyst.  In the left breast, Dr. Sadie [name] found a small spiculated mass at 2 o'clock in the subareolar region which by ultrasound was hypoechoic and measured 1 cm.  The left axilla showed normal axillary lymph node anatomy.  With this information, after appropriate discussion on the same day, Dr. Sadie [name] proceeded to aspiration of the right breast cyst which histologically proved to be benign adipose tissue.  Biopsy of the left breast, however ([id number]) showed an invasive ductal carcinoma which was 100% ER positive, 0% PR positive with a low proliferation marker at 7% and no Her-2 amplification with a ratio by CISH of 1.21.    With this information, the patient was referred to Dr. Zella [name].  Bilateral breast MRIs could not be obtained because the patient has a pacemaker in place.  So she proceeded to [name] localization lumpectomy of the left breast mass February 03, 2010.  The pathology showed [id number])  a 1.2 cm invasive ductal carcinoma, grade 2, with negative margins and only focal lymphovascular invasion.  Her-2 was repeated on this tissue and was again negative.  Her subsequent history is as detailed below.  INTERVAL HISTORY: [name] returns today for follow up of her right lung nodule acommpanied by her husband, [name] , and her daughter-in-law, [name].  I followed [name] remotely for breast cancer.  After she completed 5 years of antiestrogens we discontinued follow-up here.  More recently a chest CT scan obtained on 11/29/2016 showed a right lower lobe mass measured at 1.5 cm.  The patient has multiple comorbidities and was acutely ill at the time.  This was followed with a CT scan of the chest  this time with contrast on 03/17/2017.  At this point the mass measured 2.4 cm.  However when the September scan was remeasured on the same image (55 of series 5) it was felt there was no significant change in size although the lesion did appear a little bit bulkier subjectively.  Again the patient has been very sick, in and out of the hospital, with multiple comorbidities and in fact she is now under the care of hospice.  She has an out of facility DNR in place.  She is living in the assisted living section of [facility].  The patient is here today because she "wants to live".  She is wondering if she should go to cancer centers of [place] to get treatment.  She wants to know "what we are going to do about this" and she wants it done sooner rather than later.  She completed a PET scan on 04/07/2017 showing:  Hypermetabolic (max SUV 23.5) apical left upper lobe 2.0 cm pulmonary nodule, most compatible with primary bronchogenic carcinoma. This nodule is new since a 07/14/2010 chest CT. Separate 0.5 cm left upper lobe pulmonary nodule. No hypermetabolic thoracic adenopathy or distant metastatic disease. Mild focal hypermetabolism in the anterior right fifth through ninth ribs at the sites of  subacute healing fractures. Chronic findings include: Aortic Atherosclerosis (ICD10-I70.0). Ectatic 4.2 cm ascending thoracic aorta. Stable 3.7 cm suprarenal abdominal aortic aneurysm. Nonobstructing bilateral nephrolithiasis. Coronary atherosclerosis. Mild sigmoid diverticulosis.  REVIEW OF SYSTEMS: She has a productive cough with greenish phlegm which according to the family is chronic, at least for several months.. She has taken antibiotics several times over the last 6 months, but it keeps coming back. [name] reports that she has gained some weight, but she is trying to lose weight. She lives at the [facility]. She does not like the food there, except for the desserts. She tries to stay active with the fitness programs at her assisted living home.  She has been complaining of increasing pain in her right arm, where she has had fractures in the past.  She is not aware of any recent falls.  The family is also concerned regarding memory problems.  She denies unusual headaches, visual changes, nausea, vomiting, or dizziness.  There has been no change in bowel or bladder habits. She denies unexplained fatigue or unexplained weight loss, bleeding, rash, or fever. A detailed review of systems was otherwise stable.    PAST MEDICAL HISTORY: Past Medical History:  Diagnosis Date  . Acute blood loss anemia 04/20/2012  . AF (atrial fibrillation) ([place])     AV ablation 9/09 [place] per Dr Ola Spurr - AV node ablation 9/11 Dr Caryl Comes  . Amiodarone pulmonary toxicity   . Ascending aortic aneurysm ([place]) 12/11/2016  . Asthma   . Bronchitis    hx of   . [name] (coronary artery disease)    (not sure of this 11/10  . CHF (congestive heart failure) ([place])   . Chronic renal insufficiency, stage III (moderate) (HCC)    CrCl about 60 ml/min  . CKD (chronic kidney disease) stage 3, GFR 30-59 ml/min (HCC)   . Complication of anesthesia    "psycotic episode" after hip surg - resolved  . COPD (chronic  obstructive pulmonary disease) (HCC)    emphysema -FeV1 73% DLCO 53% 5/09  . CVA (cerebral vascular accident) ([place])    no residual effects evident to pt   . Depression 06/06/2012  . Diabetes ([place])   . Diastolic heart failure    Acute on Chronic  . Eczema   . GERD (gastroesophageal reflux disease)   . History of skin cancer   . Hx of cardiovascular stress test    Lexiscan Myoview (09/2013):  No definite ischemia, EF 67%; low risk  . Hyperlipidemia   . Invasive ductal carcinoma of breast ([facility]) 2011   LEFT   . Mental disorder   . OA (osteoarthritis) of knee    RIGHT  . Pacemaker    Permanent  . PAD (peripheral artery disease) (HCC)    w/hx right iliac/SFA stenting and left and rt leg PTA  . Pain    pt states has pain in fingertips per right hand pt states has been told may be carpal tunnel  . Pneumonia    hx of   . Right sided sciatica   . Shortness of breath dyspnea    walking distances   . Sinoatrial node  dysfunction ([place])   . Sleep apnea    associated with hypersomnia uses O2 2L/M at night and during naps also uses CPAP  . Tobacco abuse   . Tremors of nervous system    hands bilat     PAST SURGICAL HISTORY: Past Surgical History:  Procedure Laterality Date  . ATRIAL ABLATION SURGERY     x 2 - "did not help - had to have pacemaker"  . BREAST LUMPECTOMY     left breast  . CARDIAC CATHETERIZATION    . CATARACT EXTRACTION, BILATERAL     with IOL/Dr Katy Fitch  . EP IMPLANTABLE DEVICE N/A 02/13/2015   Procedure: PPM Generator Changeout-St. Jude device;  Surgeon: Tiffany Sprang, MD;  Location: [facility];  Service: Cardiovascular;  Laterality: N/A;  . MASTECTOMY, PARTIAL  02/03/2010   Left/Dr Rosenbower  . ORIF ACETABULAR FRACTURE Left 04/19/2012   Procedure: OPEN REDUCTION INTERNAL FIXATION (ORIF) ACETABULAR FRACTURE;  Surgeon: Rozanna Box, MD;  Location: [facility];  Service: Orthopedics;  Laterality: Left;  . sun spot removed      forhead  . TOTAL HIP ARTHROPLASTY  Left 10/31/2014   Procedure: LEFT TOTAL HIP ARTHROPLASTY POSTERIOR  APPROACH;  Surgeon: Gaynelle Arabian, MD;  Location: [facility];  Service: Orthopedics;  Laterality: Left;  . TOTAL KNEE ARTHROPLASTY Right 10/16/2013   Procedure: RIGHT TOTAL KNEE ARTHROPLASTY;  Surgeon: Gearlean Alf, MD;  Location: [facility];  Service: Orthopedics;  Laterality: Right;  Marland Kitchen VASCULAR SURGERY     both legs     FAMILY HISTORY Family History  Problem Relation Age of Onset  . Heart disease Father   The patient's father died from myocardial infarction at the age of 38.  The patient's mother died from unclear causes in the setting of COPD and ETOH abuse at the age of 62.  The patient had two sisters.  There is no history of breast or ovarian cancer in the family.   GYNECOLOGIC HISTORY: She is GX P4.  First pregnancy to term she believes was age 68.  The patient underwent the change of life in her late thirties.  She never took hormone therapy. She does not recall her age at menarche.    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  Her husband, Inocente Salles, was in industrial supplies.  Her son, Inocente Salles, is completing a PhD in Psychology .  The patient's daughter-in-law is Aireanna Luellen, 1 of our cardiologists.  Daughter Enid Derry teaches kindergarten in the [place] area.  Daughter Zigmund Daniel is currently working for a Brewing technologist at [employer].  Son Clair Gulling lives in [place] and works at [employer]. The patient has 6 grandchildren, no great grandchildren.  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     ADVANCED DIRECTIVES: in place  HEALTH MAINTENANCE: Social History   Tobacco Use  . Smoking status: Former Smoker    Packs/day: 1.00    Years: 55.00    Pack years: 55.00    Types: Cigarettes    Last attempt to quit: 12/19/2011    Years since quitting: 5.4  . Smokeless tobacco: Never Used  . Tobacco comment: started smoking at age 76--4 cigs per  day.  smoked 2ppd the last 10 yrs  Substance Use Topics  . Alcohol use: Yes    Comment: wine (rare)  . Drug use: No     Colonoscopy: due  PAP:  Bone density:  T - 1.17 Mar 2010  Lipid panel:  Allergies  Allergen Reactions  . Cholestatin Other (See Comments)    RAGWEED SEASON...sneezing   . Sulfur Itching    Current Outpatient Medications  Medication Sig Dispense Refill  . acetaminophen (TYLENOL) 325 MG tablet Take 650 mg by mouth every 6 (six) hours as needed for mild pain or fever.    Marland Kitchen amoxicillin-clavulanate (AUGMENTIN) 875-125 MG tablet Take 1 tablet by mouth 2 (two) times daily with a meal. For 1day 2 tablet 0  . atorvastatin (LIPITOR) 20 MG tablet TAKE 1 TABLET EVERY DAY 90 tablet 3  . B-D ULTRA-FINE 33 LANCETS MISC Use to help check blood sugars daily Dx E11.9 100 each 2  . benzonatate (TESSALON) 200 MG capsule Take 1 capsule (200 mg total) by mouth 2 (two) times daily as needed for cough. 30 capsule 1  . chlorpheniramine-HYDROcodone (TUSSIONEX PENNKINETIC ER) 10-8 MG/5ML SUER Take 5 mLs by mouth 2 (two) times daily. (Patient not taking: Reported on 04/23/2017) 140 mL 0  . ELIQUIS 5 MG TABS tablet TAKE 1 TABLET TWICE DAILY  (MUST  ESTABLISH  NEW  PRIMARY CARE PROVIDER  FOR FUTURE REFILLS) 180 tablet 3  . escitalopram (LEXAPRO) 10 MG tablet     . furosemide (LASIX) 40 MG tablet Take 1 tablet (40 mg total) by mouth 2 (two) times daily. 30 tablet   . glipiZIDE (GLUCOTROL) 5 MG tablet Take 5 mg by mouth daily before breakfast.    . glucose blood (BAYER CONTOUR TEST) test strip 1 each by Other route daily. Use to check blood sugars every day Dx E11.9 90 each 3  . levalbuterol (XOPENEX) 0.63 MG/3ML nebulizer solution Take 0.63 mg by nebulization daily as needed for wheezing or shortness of breath.     Marland Kitchen LORazepam (ATIVAN) 0.5 MG tablet Take 0.5 mg by mouth 2 (two) times daily.    . memantine (NAMENDA XR) 28 MG CP24 24 hr capsule Take 1 capsule (28 mg total) by mouth daily. 30  capsule   . metFORMIN (GLUCOPHAGE XR) 500 MG 24 hr tablet Take 1 tablet (500 mg total) by mouth daily with breakfast.    . mometasone-formoterol (DULERA) 200-5 MCG/ACT AERO Inhale 1 puff into the lungs 2 (two) times daily.    Marland Kitchen MUCINEX 600 MG 12 hr tablet TAKE ONE TABLET TWICE DAILY 60 tablet 0  . OXYGEN Inhale 2 L into the lungs.    . polyethylene glycol (MIRALAX / GLYCOLAX) packet Take 17 g by mouth 2 (two) times daily. 14 each 0  . potassium chloride SA (K-DUR,KLOR-CON) 20 MEQ tablet Take 1 tablet (20 mEq total) by mouth 2 (two) times daily.    . predniSONE (DELTASONE) 10 MG tablet Take 1 tablet (10 mg total) by mouth daily with breakfast. 30 tablet 0  . predniSONE (DELTASONE) 20 MG tablet Take 1-2 tablets (20-40 mg total) by mouth daily with breakfast. Take 70m daily for 2days then 214mdaily for 2days then 1081maily thereafter 6 tablet 0  . Respiratory Therapy Supplies (FLUTTER) DEVI Use as directed 1 each 0  . senna-docusate (SENOKOT-S) 8.6-50 MG tablet Take 1 tablet by mouth 2 (two) times daily.    . SMarland KitchenIRIVA HANDIHALER 18 MCG inhalation capsule INHALE THE CONTENTS OF 1 CAPSULE EVERY DAY 90 capsule 3   No current facility-administered medications for this visit.     OBJECTIVE: Elderly white woman wearing oxygen through a nasal cannula Vitals:   06/07/17 1448  Pulse: 78  Resp: 18  Temp: 98.2 F (36.8 C)  SpO2: 96%     Body mass index is 29.55 kg/m.    ECOG FS: 2  Sclerae unicteric, EOMs intact No cervical or supraclavicular adenopathy Lungs show inspiratory wheezes bilaterally, right greater than left Heart regular rate and rhythm Abd soft, nontender, positive bowel sounds MSK no focal spinal tenderness, no upper extremity lymphedema Neuro: nonfocal, well oriented, appropriate affect Breasts: The right breast is benign.  The left breast is undergone lumpectomy.  There is no evidence of local recurrence.  Both axillae are benign. Skin: Slightly erythematous lesion in the  right cheek is flat and slightly scaly   LAB RESULTS: Lab Results  Component Value Date   WBC 11.2 (H) 04/28/2017   NEUTROABS 6.8 04/23/2017   HGB 12.3 04/28/2017   HCT 38.5 04/28/2017   MCV 91.0 04/28/2017   PLT 230 04/28/2017      Chemistry      Component Value Date/Time   NA 142 04/28/2017 0430   NA 142 02/03/2016 1314   K 3.3 (L) 04/28/2017 0430   K 4.2 02/03/2016 1314   CL 104 04/28/2017 0430   CO2 27 04/28/2017 0430   CO2 25 02/03/2016 1314   BUN 22 (H) 04/28/2017 0430   BUN 16.2 02/03/2016 1314   CREATININE 1.18 (H) 04/28/2017 0430   CREATININE 1.2 (H) 02/03/2016 1314      Component Value Date/Time   CALCIUM 8.3 (L) 04/28/2017 0430   CALCIUM 9.8 02/03/2016 1314   ALKPHOS 54 04/23/2017 0929   ALKPHOS 94 02/03/2016 1314   AST 20 04/23/2017 0929   AST 21 02/03/2016 1314   ALT 14 04/23/2017 0929   ALT 13 02/03/2016 1314   BILITOT 0.8 04/23/2017 0929   BILITOT 0.44 02/03/2016 1314       Lab Results  Component Value Date   LABCA2 22 07/14/2011    No components found for: [id number]  No results for input(s): INR in the last 168 hours.  Urinalysis    Component Value Date/Time   COLORURINE YELLOW 04/24/2017 0721   APPEARANCEUR CLEAR 04/24/2017 0721   LABSPEC 1.013 04/24/2017 0721   PHURINE 5.0 04/24/2017 0721   GLUCOSEU NEGATIVE 04/24/2017 0721   HGBUR NEGATIVE 04/24/2017 0721   BILIRUBINUR NEGATIVE 04/24/2017 0721   KETONESUR NEGATIVE 04/24/2017 0721   PROTEINUR NEGATIVE 04/24/2017 0721   UROBILINOGEN 0.2 10/24/2014 1433   NITRITE NEGATIVE 04/24/2017 0721   LEUKOCYTESUR NEGATIVE 04/24/2017 0721    STUDIES: She completed a PET scan on 04/07/2017 showing: Hypermetabolic (max SUV 15.4) apical left upper lobe 2.0 cm pulmonary nodule, most compatible with primary bronchogenic carcinoma. This nodule is new since a 07/14/2010 chest CT. Separate 0.5 cm left upper lobe pulmonary nodule. No hypermetabolic thoracic adenopathy or distant metastatic disease.  Mild focal hypermetabolism in the anterior right fifth through ninth ribs at the sites of subacute healing fractures. Chronic findings include: Aortic Atherosclerosis (ICD10-I70.0). Ectatic 4.2 cm ascending thoracic aorta. Stable 3.7 cm suprarenal abdominal aortic aneurysm. Nonobstructing bilateral nephrolithiasis. Coronary atherosclerosis. Mild sigmoid diverticulosis.   ASSESSMENT: 84 y.o.  [place] woman   (1) status post left lumpectomy November 2011 for a pT1c cN0, stage I invasive ductal carcinoma, grade 2, strongly estrogen receptor positive, with a low proliferation fraction at 7%, progesterone receptor and HER-2 negative, on letrozole since December 2011 with excellent tolerance  (2) Osteoporosis - zometa started November 2015, repeated November 2016  (3) left upperlobe mass noted September 2018 on noncontrasted CT  (a) minimal change in size  but slightly "bulkier" on contrast chest CT 03/17/2017  (b) PET scan 04/07/2017 shows the left upper lobe nodule at 2.0 cm, with an SUV max of 10.5  PLAN:  [name] has a left upper lobe mass which is almost certainly a primary lung neoplasm.  She has a long history of tobacco abuse, although she is not smoking currently, and emphysema.  This appears to be stage I at this point.  It is not causing her any problems.  The question is how to proceed.  Ideally we would obtain a biopsy and we discussed the fact that there are many different types of lung cancer and they are treated differently.  However if in course of biopsy she were to have her lung drop I do not think she would survive.  Accordingly I do not think that is going to be an option.  She has also been evaluated for possible surgery and she is not a surgical candidate because of the status of her lungs  Accordingly the choices are continued observation versus radiation.  I think radiation may be a good choice for her but it may be a good idea first to get another CT of the chest to see how  fast this mass is changing, if at all.  I am setting her up for a noncontrast CT of the chest sometime in the next week and she will return to see me a few days later to discuss those results.  Aside from that issue, she c/o increasing pain in the right humerus and shoulder. We will obtain plain films today to evaluate for fracture.  She also complains of sinus symptoms and I am adding Claritin to her medications.  The family is also concerned re changes in mentation.  Today she was completely clear right through the entire visit.  I think possibly the lorazepam is causing her some confusion and we are stopping that medication.  Depending on the scan results we will radiation versus further observation.  She very likely would prefer the former.  I will ask 1 of the radiation doctors to evaluate.     Draxton Luu, Tiffany Dad, MD  06/07/17 3:15 PM Medical Oncology and Hematology [facility] [address] [place], [place] [address] Tel. [phone]    Fax. [phone]  This document serves as a record of services personally performed by Lurline Del, MD. It was created on his behalf by Sheron Nightingale, a trained medical scribe. The creation of this record is based on the scribe's personal observations and the provider's statements to them.   I have reviewed the above documentation for accuracy and completeness, and I agree with the above.

## 2017-06-07 NOTE — Addendum Note (Signed)
Addended by: Juliane Poot on: 06/07/2017 04:17 PM   Modules accepted: Orders

## 2017-06-08 ENCOUNTER — Encounter: Payer: Self-pay | Admitting: Oncology

## 2017-06-08 LAB — CEA (IN HOUSE-CHCC): CEA (CHCC-In House): 3.23 ng/mL (ref 0.00–5.00)

## 2017-06-08 LAB — CANCER ANTIGEN 27.29: CA 27.29: 23.9 U/mL (ref 0.0–38.6)

## 2017-06-10 DIAGNOSIS — F331 Major depressive disorder, recurrent, moderate: Secondary | ICD-10-CM | POA: Diagnosis not present

## 2017-06-10 DIAGNOSIS — F411 Generalized anxiety disorder: Secondary | ICD-10-CM | POA: Diagnosis not present

## 2017-06-15 DIAGNOSIS — F0391 Unspecified dementia with behavioral disturbance: Secondary | ICD-10-CM | POA: Diagnosis not present

## 2017-06-16 ENCOUNTER — Ambulatory Visit (HOSPITAL_COMMUNITY)
Admission: RE | Admit: 2017-06-16 | Discharge: 2017-06-16 | Disposition: A | Discharge status: Home / Self Care | Payer: Medicare Other | Source: Ambulatory Visit | Attending: Oncology | Admitting: Oncology

## 2017-06-16 ENCOUNTER — Ambulatory Visit (HOSPITAL_COMMUNITY): Payer: Medicare Other

## 2017-06-16 DIAGNOSIS — Z170 Estrogen receptor positive status [ER+]: Secondary | ICD-10-CM | POA: Diagnosis not present

## 2017-06-16 DIAGNOSIS — I5032 Chronic diastolic (congestive) heart failure: Secondary | ICD-10-CM | POA: Insufficient documentation

## 2017-06-16 DIAGNOSIS — J441 Chronic obstructive pulmonary disease with (acute) exacerbation: Secondary | ICD-10-CM

## 2017-06-16 DIAGNOSIS — C50212 Malignant neoplasm of upper-inner quadrant of left female breast: Secondary | ICD-10-CM | POA: Diagnosis not present

## 2017-06-16 DIAGNOSIS — R911 Solitary pulmonary nodule: Secondary | ICD-10-CM | POA: Diagnosis not present

## 2017-06-16 DIAGNOSIS — J439 Emphysema, unspecified: Secondary | ICD-10-CM | POA: Diagnosis not present

## 2017-06-16 DIAGNOSIS — I2510 Atherosclerotic heart disease of native coronary artery without angina pectoris: Secondary | ICD-10-CM | POA: Insufficient documentation

## 2017-06-16 DIAGNOSIS — Z7189 Other specified counseling: Secondary | ICD-10-CM

## 2017-06-16 DIAGNOSIS — C3412 Malignant neoplasm of upper lobe, left bronchus or lung: Secondary | ICD-10-CM | POA: Diagnosis not present

## 2017-06-16 DIAGNOSIS — I700 Atherosclerosis of aorta: Secondary | ICD-10-CM | POA: Diagnosis not present

## 2017-06-17 DIAGNOSIS — F411 Generalized anxiety disorder: Secondary | ICD-10-CM | POA: Diagnosis not present

## 2017-06-17 DIAGNOSIS — F331 Major depressive disorder, recurrent, moderate: Secondary | ICD-10-CM | POA: Diagnosis not present

## 2017-06-17 NOTE — Progress Notes (Signed)
[facility]  [phone] [phone]    ID: [name]   DOB: 1932/01/05  MR#: [id number]  CHY#:[id number]  Patient Care Team: Bellamia Ferch, Virgie Dad, MD as PCP - General (Oncology) Clent Jacks, MD (Ophthalmology) Deboraha Sprang, MD (Cardiology) Jackolyn Confer, MD (General Surgery) Laqueshia Cihlar, Virgie Dad, MD (Hematology and Oncology) Deterding, Jeneen Rinks, MD (Nephrology) Particia Nearing, MD (Dermatology) Laurence Spates, MD (Gastroenterology) Almedia Balls, MD (Orthopedic Surgery) Marshell Garfinkel, MD as Consulting Physician (Pulmonary Disease)  CHIEF COMPLAINT: left breast cancer   CURRENT TREATMENT:  Right lung nodule  BREAST CANCER HISTORY: From the original intake note:  [name] had screening mammography September 29th 2011 which showed possible masses in both breasts (the prior mammogram that I have is from October of 2008).  She was recalled for further views on October 11th.  In the right breast, there was an oval circumscribed nodule in the anterior lower outer quadrant which by ultrasound was felt possibly to represent either an intraductal lesion or debris in a dilated cyst.  In the left breast, Dr. Sadie Haber found a small spiculated mass at 2 o'clock in the subareolar region which by ultrasound was hypoechoic and measured 1 cm.  The left axilla showed normal axillary lymph node anatomy.  With this information, after appropriate discussion on the same day, Dr. Sadie Haber proceeded to aspiration of the right breast cyst which histologically proved to be benign adipose tissue.  Biopsy of the left breast, however ([id number]) showed an invasive ductal carcinoma which was 100% ER positive, 0% PR positive with a low proliferation marker at 7% and no Her-2 amplification with a ratio by CISH of 1.21.    With this information, the patient was referred to Dr. Zella Richer.  Bilateral breast MRIs could not be obtained because the patient has a pacemaker in place.   So she proceeded to needle localization lumpectomy of the left breast mass February 03, 2010.  The pathology showed [id number]) a 1.2 cm invasive ductal carcinoma, grade 2, with negative margins and only focal lymphovascular invasion.  Her-2 was repeated on this tissue and was again negative.  Her subsequent history is as detailed below.  INTERVAL HISTORY: [name] returns today for follow up of her right lung nodule. She is accompanied by her daughter [name], who drove up from [place] to be here today with her.    Since her last visit here [name] underwent a a CT chest without contrast, 06/16/2017, showing mid interval growth or irregular solid 2.4 cm peripheral apical left upper lobe pulmonary nodule, most compatible with enlarging primary bronchogenic carcinoma. Separate 0.5 cm left upper lobe pulmonary nodule is stable. No pathologically enlarged thoracic lymph nodes. New patchy consolidation in the right lower lobe with prominent layering debris in the central right lower lung airways, most compatible with pneumonia, possible from aspiration given recurrent episodes of right lower lobe pneumonia on multiple recent prior chest imaging studies. Left main and 3 vessel coronary atherosclerosis. Stable ectatic 4.2 cm ascending thoracic aorta.   She is here today to record those results.  REVIEW OF SYSTEMS: [name] tells me she does quite a bit of walking but in her room because she has a bad cough and the cough scares other people.  "They think I am dying".  The patient's daughter tells me [name] actually sleeps most of the day.  There is a question regarding frequent aspiration but the patient says her appetite is okay and her weight in fact up as compared to 6 months  ago.  Much of that of course may be fluid weight.  Of course the patient wears oxygen 24/7.  However she is not using her CPAP which "every doctor has told her to do" according to the daughter.  The daughter brought me a photo of  the patient's recent expectoration which included some blood as well as mucus and the patient did expectorate some greenish phlegm without blood during her visit today.  A detailed review of systems was otherwise stable.  PAST MEDICAL HISTORY: Past Medical History:  Diagnosis Date  . Acute blood loss anemia 04/20/2012  . AF (atrial fibrillation) ([facility])     AV ablation 9/09 [place] per Dr Ola Spurr - AV node ablation 9/11 Dr Caryl Comes  . Amiodarone pulmonary toxicity   . Ascending aortic aneurysm ([facility]) 12/11/2016  . Asthma   . Bronchitis    hx of   . [name] (coronary artery disease)    (not sure of this 11/10  . CHF (congestive heart failure) ([facility])   . Chronic renal insufficiency, stage III (moderate) (HCC)    CrCl about 60 ml/min  . CKD (chronic kidney disease) stage 3, GFR 30-59 ml/min (HCC)   . Complication of anesthesia    "psycotic episode" after hip surg - resolved  . COPD (chronic obstructive pulmonary disease) (HCC)    emphysema -FeV1 73% DLCO 53% 5/09  . CVA (cerebral vascular accident) ([facility])    no residual effects evident to pt   . Depression 06/06/2012  . Diabetes ([facility])   . Diastolic heart failure    Acute on Chronic  . Eczema   . GERD (gastroesophageal reflux disease)   . History of skin cancer   . Hx of cardiovascular stress test    Lexiscan Myoview (09/2013):  No definite ischemia, EF 67%; low risk  . Hyperlipidemia   . Invasive ductal carcinoma of breast ([facility]) 2011   LEFT   . Mental disorder   . OA (osteoarthritis) of knee    RIGHT  . Pacemaker    Permanent  . PAD (peripheral artery disease) (HCC)    w/hx right iliac/SFA stenting and left and rt leg PTA  . Pain    pt states has pain in fingertips per right hand pt states has been told may be carpal tunnel  . Pneumonia    hx of   . Right sided sciatica   . Shortness of breath dyspnea    walking distances   . Sinoatrial node dysfunction (HCC)   . Sleep apnea    associated with hypersomnia uses O2 2L/M at night and  during naps also uses CPAP  . Tobacco abuse   . Tremors of nervous system    hands bilat     PAST SURGICAL HISTORY: Past Surgical History:  Procedure Laterality Date  . ATRIAL ABLATION SURGERY     x 2 - "did not help - had to have pacemaker"  . BREAST LUMPECTOMY     left breast  . CARDIAC CATHETERIZATION    . CATARACT EXTRACTION, BILATERAL     with IOL/Dr Katy Fitch  . EP IMPLANTABLE DEVICE N/A 02/13/2015   Procedure: PPM Generator Changeout-St. Jude device;  Surgeon: Deboraha Sprang, MD;  Location: [facility];  Service: Cardiovascular;  Laterality: N/A;  . MASTECTOMY, PARTIAL  02/03/2010   Left/Dr Rosenbower  . ORIF ACETABULAR FRACTURE Left 04/19/2012   Procedure: OPEN REDUCTION INTERNAL FIXATION (ORIF) ACETABULAR FRACTURE;  Surgeon: Rozanna Box, MD;  Location: [facility];  Service: Orthopedics;  Laterality: Left;  . sun spot removed      forhead  . TOTAL HIP ARTHROPLASTY Left 10/31/2014   Procedure: LEFT TOTAL HIP ARTHROPLASTY POSTERIOR  APPROACH;  Surgeon: Gaynelle Arabian, MD;  Location: [facility];  Service: Orthopedics;  Laterality: Left;  . TOTAL KNEE ARTHROPLASTY Right 10/16/2013   Procedure: RIGHT TOTAL KNEE ARTHROPLASTY;  Surgeon: Gearlean Alf, MD;  Location: [facility];  Service: Orthopedics;  Laterality: Right;  Marland Kitchen VASCULAR SURGERY     both legs     FAMILY HISTORY Family History  Problem Relation Age of Onset  . Heart disease Father   The patient's father died from myocardial infarction at the age of 47.  The patient's mother died from unclear causes in the setting of COPD and ETOH abuse at the age of 67.  The patient had two sisters.  There is no history of breast or ovarian cancer in the family.   GYNECOLOGIC HISTORY: She is GX P4.  First pregnancy to term she believes was age 34.  The patient underwent the change of life in her late thirties.  She never took hormone therapy. She does not recall her age at menarche.    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  Her husband, [name], was in industrial supplies.  Her son, [name], is completing a PhD in Psychology .  The patient's daughter-in-law is [name], is of course 1 of our cardiologists.  Daughter [name] teaches kindergarten in the [place] area.  Daughter [name] is currently working for a Brewing technologist at [employer].  Son [name] lives in [place] and works at [employer]. The patient has 6 grandchildren, no great grandchildren.  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     ADVANCED DIRECTIVES: in place  HEALTH MAINTENANCE: Social History   Tobacco Use  . Smoking status: Former Smoker    Packs/day: 1.00    Years: 55.00    Pack years: 55.00    Types: Cigarettes    Last attempt to quit: 12/19/2011    Years since quitting: 5.5  . Smokeless tobacco: Never Used  . Tobacco comment: started smoking at age 63--4 cigs per day.  smoked 2ppd the last 10 yrs  Substance Use Topics  . Alcohol use: Yes    Comment: wine (rare)  . Drug use: No     Colonoscopy: due  PAP:  Bone density: T - 1.17 Mar 2010  Lipid panel:  Allergies  Allergen Reactions  . Cholestatin Other (See Comments)    RAGWEED SEASON...sneezing   . Sulfur Itching    Current Outpatient Medications  Medication Sig Dispense Refill  . acetaminophen (TYLENOL) 325 MG tablet Take 650 mg by mouth every 6 (six) hours as needed for mild pain or fever.    Marland Kitchen atorvastatin (LIPITOR) 20 MG tablet TAKE 1 TABLET EVERY DAY 90 tablet 3  . B-D ULTRA-FINE 33 LANCETS MISC Use to help check blood sugars daily Dx E11.9 100 each 2  . ELIQUIS 5 MG TABS tablet TAKE 1 TABLET TWICE DAILY  (MUST  ESTABLISH  NEW  PRIMARY CARE PROVIDER  FOR FUTURE REFILLS) 180 tablet 3  . escitalopram (LEXAPRO) 10 MG tablet     . furosemide (LASIX) 40 MG tablet Take 1 tablet (40 mg total) by mouth 2 (two) times daily. 30 tablet   . glipiZIDE (GLUCOTROL) 5 MG tablet Take 5 mg by mouth daily  before breakfast.    . glucose blood (BAYER CONTOUR TEST) test strip 1 each by Other route daily. Use to check blood sugars every day Dx E11.9 90 each 3  . levalbuterol (XOPENEX) 0.63 MG/3ML nebulizer solution Take 0.63 mg by nebulization daily as needed for wheezing or shortness of breath.     . memantine (NAMENDA XR) 28 MG CP24 24 hr capsule Take 1 capsule (28 mg total) by mouth daily. 30 capsule   . metFORMIN (GLUCOPHAGE XR) 500 MG 24 hr tablet Take 1 tablet (500 mg total) by mouth daily with breakfast.    . mometasone-formoterol (DULERA) 200-5 MCG/ACT AERO Inhale 1 puff into the lungs 2 (two) times daily.    Marland Kitchen MUCINEX 600 MG 12 hr tablet TAKE ONE TABLET TWICE DAILY 60 tablet 0  . OXYGEN Inhale 2 L into the lungs.    . polyethylene glycol (MIRALAX / GLYCOLAX) packet Take 17 g by mouth 2 (two) times daily. 14 each 0  . potassium chloride SA (K-DUR,KLOR-CON) 20 MEQ tablet Take 1 tablet (20 mEq total) by mouth 2 (two) times daily.    . predniSONE (DELTASONE) 10 MG tablet Take 1 tablet (10 mg total) by mouth daily with breakfast. 30 tablet 0  . predniSONE (DELTASONE) 20 MG tablet Take 1-2 tablets (20-40 mg total) by mouth daily with breakfast. Take 48m daily for 2days then 292mdaily for 2days then 1076maily thereafter 6 tablet 0  . Respiratory Therapy Supplies (FLUTTER) DEVI Use as directed 1 each 0  . senna-docusate (SENOKOT-S) 8.6-50 MG tablet Take 1 tablet by mouth 2 (two) times daily.    . SMarland KitchenIRIVA HANDIHALER 18 MCG inhalation capsule INHALE THE CONTENTS OF 1 CAPSULE EVERY DAY 90 capsule 3   No current facility-administered medications for this visit.     OBJECTIVE: Elderly white woman with a portable oxygen tank and nasal cannula, pushing a chair/walker Vitals:   06/18/17 1208  Pulse: 84  Resp: 18  Temp: (!) 97.4 F (36.3 C)  SpO2: 94%     Body mass index is 29.4 kg/m.    ECOG FS: 2 Patient refused blood pressure today because she says the cough is too tight  Sclerae unicteric,  pupils round and equal Oropharynx shows full upper plate No cervical or supraclavicular adenopathy Lungs no rales or rhonchi Heart regular rate and rhythm Abd soft, nontender, positive bowel sounds Neuro: nonfocal, well oriented, feisty affect Breasts: Deferred  LAB RESULTS: Lab Results  Component Value Date   WBC 12.9 (H) 06/07/2017   NEUTROABS 10.9 (H) 06/07/2017   HGB 12.7 06/07/2017   HCT 40.1 06/07/2017   MCV 89.6 06/07/2017   PLT 238 06/07/2017      Chemistry      Component Value Date/Time   NA 142 06/07/2017 1552   NA 142 02/03/2016 1314   K 4.0 06/07/2017 1552   K 4.2 02/03/2016 1314   CL 105 06/07/2017 1552   CO2 27 06/07/2017 1552   CO2 25 02/03/2016 1314   BUN 20 06/07/2017 1552   BUN 16.2 02/03/2016 1314   CREATININE 1.17 (H) 06/07/2017 1552   CREATININE 1.2 (H) 02/03/2016 1314      Component Value Date/Time   CALCIUM 9.5 06/07/2017 1552   CALCIUM 9.8 02/03/2016 1314   ALKPHOS 59 06/07/2017 1552   ALKPHOS 94 02/03/2016 1314   AST 13 06/07/2017 1552   AST 21 02/03/2016 1314   ALT 12 06/07/2017 1552   ALT 13 02/03/2016 1314   BILITOT 0.3  06/07/2017 1552   BILITOT 0.44 02/03/2016 1314       Lab Results  Component Value Date   LABCA2 22 07/14/2011    No components found for: [id number]  No results for input(s): INR in the last 168 hours.  Urinalysis    Component Value Date/Time   COLORURINE YELLOW 04/24/2017 0721   APPEARANCEUR CLEAR 04/24/2017 0721   LABSPEC 1.013 04/24/2017 0721   PHURINE 5.0 04/24/2017 0721   GLUCOSEU NEGATIVE 04/24/2017 0721   HGBUR NEGATIVE 04/24/2017 0721   BILIRUBINUR NEGATIVE 04/24/2017 0721   KETONESUR NEGATIVE 04/24/2017 0721   PROTEINUR NEGATIVE 04/24/2017 0721   UROBILINOGEN 0.2 10/24/2014 1433   NITRITE NEGATIVE 04/24/2017 0721   LEUKOCYTESUR NEGATIVE 04/24/2017 0721    STUDIES: Dg Shoulder Right  Result Date: 06/07/2017 CLINICAL DATA:  Intermittent shoulder pain. EXAM: RIGHT SHOULDER - 2+ VIEW  COMPARISON:  Chest CT-03/17/2017 FINDINGS: Osteopenia. There is a vertically oriented minimally displaced fracture involving the posterior aspect of glenoid, similar in hind site to chest CT performed 03/17/2017. No definite acute fracture or dislocation. Mild degenerative change the right AC joint with joint space loss, subchondral sclerosis and inferiorly directed osteophytosis. No evidence of calcific tendinitis. Limited visualization of the adjacent thorax demonstrates mild diffuse slightly nodular thickening of interstitium. Pacemaker leads. Atherosclerotic plaque within the thoracic aorta. IMPRESSION: Vertically oriented minimally displaced fracture involving the posterior aspect of the glenoid, similar in hind site to chest CT performed 03/17/2017. Electronically Signed   By: Sandi Mariscal M.D.   On: 06/07/2017 17:36   Ct Chest Wo Contrast  Result Date: 06/16/2017 CLINICAL DATA:  Follow-up hypermetabolic left upper lobe pulmonary nodule. EXAM: CT CHEST WITHOUT CONTRAST TECHNIQUE: Multidetector CT imaging of the chest was performed following the standard protocol without IV contrast. COMPARISON:  04/07/2017 PET-CT.  03/17/2017 chest CT. FINDINGS: Cardiovascular: Normal heart size. No significant pericardial fluid/thickening. Left main and 3 vessel coronary atherosclerosis. Atherosclerotic thoracic aorta with stable ectatic 4.2 cm ascending thoracic aorta. Normal caliber pulmonary arteries. Stable configuration of 2 lead left subclavian pacemaker with lead tips in the right atrium and right ventricular apex. Mediastinum/Nodes: No discrete thyroid nodules. Unremarkable esophagus. No pathologically enlarged axillary, mediastinal or gross hilar lymph nodes, noting limited sensitivity for the detection of hilar adenopathy on this noncontrast study. Lungs/Pleura: No pneumothorax. No pleural effusion. Moderate centrilobular and paraseptal emphysema with mild diffuse bronchial wall thickening. Mild interval growth  of the irregular solid peripheral apical left upper lobe 2.4 x 1.7 cm pulmonary nodule (series 10/image 28), increased from 2.0 x 1.6 cm. Separate left upper lobe 5 mm pulmonary nodule (series 10/image 65) is stable. Patchy consolidation throughout the dependent/basilar right lower lobe is new. Prominent layering debris throughout the central right lower lung airways. No new significant pulmonary nodules. Upper abdomen: No acute abnormality. Stable 3.7 cm suprarenal abdominal aortic aneurysm. Musculoskeletal: No aggressive appearing focal osseous lesions. Nearly healed deformities in multiple anterior right ribs. Marked thoracic spondylosis. IMPRESSION: 1. Mild interval growth of irregular solid 2.4 cm peripheral apical left upper lobe pulmonary nodule, most compatible with enlarging primary bronchogenic carcinoma. 2. Separate 0.5 cm left upper lobe pulmonary nodule is stable. 3. No pathologically enlarged thoracic lymph nodes. 4. New patchy consolidation in the right lower lobe with prominent layering debris in the central right lower lung airways, most compatible with pneumonia, possibly from aspiration given recurrent episodes of right lower lobe pneumonia on multiple recent prior chest imaging studies. 5. Left main and 3 vessel coronary atherosclerosis. 6. Stable ectatic  4.2 cm ascending thoracic aorta. Recommend annual imaging followup by CTA or MRA. This recommendation follows 2010 ACCF/AHA/AATS/ACR/ASA/SCA/SCAI/SIR/STS/SVM Guidelines for the Diagnosis and Management of Patients with Thoracic Aortic Disease. Circulation. 2010; 121: D622-W979. Aortic Atherosclerosis (ICD10-I70.0) and Emphysema (ICD10-J43.9). Electronically Signed   By: Ilona Sorrel M.D.   On: 06/16/2017 13:34   Dg Humerus Right  Result Date: 06/07/2017 CLINICAL DATA:  Intermittent shoulder pain for the past 2 years. EXAM: RIGHT HUMERUS - 2+ VIEW COMPARISON:  Right shoulder radiographs-earlier same day; chest CT-03/17/2017; 11/29/2016  FINDINGS: Osteopenia. Vertically oriented fracture involving the posterior aspect of the glenoid appears similar to remote chest CT performed 11/2016. No acute fracture or dislocation. Mild degenerative change the right AC joint with joint space loss, subchondral sclerosis and osteophytosis. Regional soft tissues appear normal. IMPRESSION: 1. Vertically oriented fracture involving the posterior aspect of the right glenoid appears similar to remote chest CT performed 11/2016. 2. Mild degenerative change of the right [facility] joint Electronically Signed   By: Sandi Mariscal M.D.   On: 06/07/2017 17:39    ASSESSMENT: 84 y.o.  [place] woman   (1) status post left lumpectomy November 2011 for a pT1c cN0, stage I invasive ductal carcinoma, grade 2, strongly estrogen receptor positive, with a low proliferation fraction at 7%, progesterone receptor and HER-2 negative, on letrozole since December 2011 with excellent tolerance  (2) Osteoporosis - zometa started November 2015, repeated November 2016  (3) left upper lobe mass noted September 2018 on noncontrasted CT (1.5 cm)  (a) minimal change in size but slightly "bulkier" on contrast chest CT 03/17/2017 (2.0 cm)  (b) PET scan 04/07/2017 shows the left upper lobe nodule at 2.0 cm, with an SUV max of 10.5  (c) CT chest 06/07/2017 shows left upper lobe nodule measuring 2.4 cm  (d) CEA and CA 27-27 WNL 06/07/2017  PLAN:  Quenisha very likely has a primary lung cancer in the left upper lobe.  She certainly has the history to suggest this.  We have been watching this growth since September 2018.  We obtained a PET scan which shows significant SUV at greater than 10.  We also obtained some lab work, with a CEA in the normal range, which does not give [name] an indication of which sub-type of lung cancer we might be dealing with  We discussed proceeding to biopsy.  I very much do not recommend that as if she drops her lungs she would die within a minute.  There has been  some discrepancy in the measurements.  Basically the radiologist uptake in the trouble of going back to the older scans and finding the exact level of the new scan and comparing it in the old scan.  When that is done we get the readings above, namely that in September 2018 it measured 1.5 cm and in the most recent scan it measures 2.4 cm.  This means the mass grew a third of an inch in 6 months.  It is clearly not fast growing.  This is important because it means this cancer is unlikely to be what takes [name]'s life.  We discussed the fact today that I have no idea how long she will live.  She could die any day given her multiple comorbidities or she could live many months or possibly even longer.  She is already under hospice care which I think is appropriate.  I gave her 2 choices, one is further observation and the other proceeding to radiation.  She understands that radiation even  though it is likely to be generally well-tolerated can cause inflammation and she does not have a lot of lung reserve.  My recommendation is that we continue to observe this for another 3 months.  If the cancer continues to grow as before then it will have grown another few millimeters between now and then.  Meanwhile the possibility of [name] declining abruptly and terminally in that interval is high.  She is agreeable to this approach.  I have set her up for a repeat CT of the chest without contrast in the second week in July and to see me the day following.  In the meantime I have strongly encouraged her to become more active, participate in exercise programs where she is now, and I have also strongly advised her not to eat reclining back but rather to lean forward when she eats which I think will minimize the chance of aspiration  She knows to call for any problems that may develop before the next visit.          Azizah Lisle, Virgie Dad, MD  06/18/17 7:03 PM Medical Oncology and Hematology [facility] [address] [place], [place] [address] Tel. [phone]    Fax. [phone]  This document serves as a record of services personally performed by Chauncey Cruel, MD. It was created on his behalf by Margit Banda, a trained medical scribe. The creation of this record is based on the scribe's personal observations and the provider's statements to them.   I have reviewed the above documentation for accuracy and completeness, and I agree with the above.

## 2017-06-18 ENCOUNTER — Inpatient Hospital Stay (HOSPITAL_BASED_OUTPATIENT_CLINIC_OR_DEPARTMENT_OTHER): Payer: Medicare Other | Admitting: Oncology

## 2017-06-18 VITALS — HR 84 | Temp 97.4°F | Resp 18 | Ht 65.0 in | Wt 176.7 lb

## 2017-06-18 DIAGNOSIS — C3412 Malignant neoplasm of upper lobe, left bronchus or lung: Secondary | ICD-10-CM

## 2017-06-18 DIAGNOSIS — C50212 Malignant neoplasm of upper-inner quadrant of left female breast: Secondary | ICD-10-CM

## 2017-06-18 DIAGNOSIS — C50912 Malignant neoplasm of unspecified site of left female breast: Secondary | ICD-10-CM

## 2017-06-18 DIAGNOSIS — Z87891 Personal history of nicotine dependence: Secondary | ICD-10-CM | POA: Diagnosis not present

## 2017-06-18 DIAGNOSIS — M810 Age-related osteoporosis without current pathological fracture: Secondary | ICD-10-CM | POA: Diagnosis not present

## 2017-06-18 DIAGNOSIS — J439 Emphysema, unspecified: Secondary | ICD-10-CM

## 2017-06-18 DIAGNOSIS — M25511 Pain in right shoulder: Secondary | ICD-10-CM | POA: Diagnosis not present

## 2017-06-18 DIAGNOSIS — R911 Solitary pulmonary nodule: Secondary | ICD-10-CM | POA: Diagnosis not present

## 2017-06-18 DIAGNOSIS — J9621 Acute and chronic respiratory failure with hypoxia: Secondary | ICD-10-CM

## 2017-06-18 DIAGNOSIS — Z170 Estrogen receptor positive status [ER+]: Principal | ICD-10-CM

## 2017-06-24 DIAGNOSIS — F331 Major depressive disorder, recurrent, moderate: Secondary | ICD-10-CM | POA: Diagnosis not present

## 2017-06-24 DIAGNOSIS — F411 Generalized anxiety disorder: Secondary | ICD-10-CM | POA: Diagnosis not present

## 2017-06-29 DIAGNOSIS — Z79899 Other long term (current) drug therapy: Secondary | ICD-10-CM | POA: Diagnosis not present

## 2017-07-01 DIAGNOSIS — F331 Major depressive disorder, recurrent, moderate: Secondary | ICD-10-CM | POA: Diagnosis not present

## 2017-07-01 DIAGNOSIS — F411 Generalized anxiety disorder: Secondary | ICD-10-CM | POA: Diagnosis not present

## 2017-07-05 DIAGNOSIS — F0391 Unspecified dementia with behavioral disturbance: Secondary | ICD-10-CM | POA: Diagnosis not present

## 2017-07-15 DIAGNOSIS — F331 Major depressive disorder, recurrent, moderate: Secondary | ICD-10-CM | POA: Diagnosis not present

## 2017-07-15 DIAGNOSIS — F411 Generalized anxiety disorder: Secondary | ICD-10-CM | POA: Diagnosis not present

## 2017-07-19 DIAGNOSIS — I5089 Other heart failure: Secondary | ICD-10-CM | POA: Diagnosis not present

## 2017-07-19 DIAGNOSIS — R609 Edema, unspecified: Secondary | ICD-10-CM | POA: Diagnosis not present

## 2017-07-19 DIAGNOSIS — E669 Obesity, unspecified: Secondary | ICD-10-CM | POA: Diagnosis not present

## 2017-07-19 DIAGNOSIS — J441 Chronic obstructive pulmonary disease with (acute) exacerbation: Secondary | ICD-10-CM | POA: Diagnosis not present

## 2017-07-19 DIAGNOSIS — C3490 Malignant neoplasm of unspecified part of unspecified bronchus or lung: Secondary | ICD-10-CM | POA: Diagnosis not present

## 2017-07-19 DIAGNOSIS — I4891 Unspecified atrial fibrillation: Secondary | ICD-10-CM | POA: Diagnosis not present

## 2017-07-19 DIAGNOSIS — Z950 Presence of cardiac pacemaker: Secondary | ICD-10-CM | POA: Diagnosis not present

## 2017-07-22 DIAGNOSIS — F331 Major depressive disorder, recurrent, moderate: Secondary | ICD-10-CM | POA: Diagnosis not present

## 2017-07-22 DIAGNOSIS — F411 Generalized anxiety disorder: Secondary | ICD-10-CM | POA: Diagnosis not present

## 2017-07-26 ENCOUNTER — Inpatient Hospital Stay: Payer: Medicare Other | Admitting: Adult Health

## 2017-08-02 DIAGNOSIS — F0391 Unspecified dementia with behavioral disturbance: Secondary | ICD-10-CM | POA: Diagnosis not present

## 2017-08-07 DIAGNOSIS — N183 Chronic kidney disease, stage 3 (moderate): Secondary | ICD-10-CM | POA: Diagnosis not present

## 2017-08-07 DIAGNOSIS — J690 Pneumonitis due to inhalation of food and vomit: Secondary | ICD-10-CM | POA: Diagnosis not present

## 2017-08-07 DIAGNOSIS — E1159 Type 2 diabetes mellitus with other circulatory complications: Secondary | ICD-10-CM | POA: Diagnosis not present

## 2017-08-07 DIAGNOSIS — I25810 Atherosclerosis of coronary artery bypass graft(s) without angina pectoris: Secondary | ICD-10-CM | POA: Diagnosis not present

## 2017-08-07 DIAGNOSIS — I70209 Unspecified atherosclerosis of native arteries of extremities, unspecified extremity: Secondary | ICD-10-CM | POA: Diagnosis not present

## 2017-08-07 DIAGNOSIS — I679 Cerebrovascular disease, unspecified: Secondary | ICD-10-CM | POA: Diagnosis not present

## 2017-08-07 DIAGNOSIS — J449 Chronic obstructive pulmonary disease, unspecified: Secondary | ICD-10-CM | POA: Diagnosis not present

## 2017-08-07 DIAGNOSIS — I495 Sick sinus syndrome: Secondary | ICD-10-CM | POA: Diagnosis not present

## 2017-08-07 DIAGNOSIS — I4891 Unspecified atrial fibrillation: Secondary | ICD-10-CM | POA: Diagnosis not present

## 2017-08-07 DIAGNOSIS — C3490 Malignant neoplasm of unspecified part of unspecified bronchus or lung: Secondary | ICD-10-CM | POA: Diagnosis not present

## 2017-08-07 DIAGNOSIS — F0151 Vascular dementia with behavioral disturbance: Secondary | ICD-10-CM | POA: Diagnosis not present

## 2017-08-07 DIAGNOSIS — Z853 Personal history of malignant neoplasm of breast: Secondary | ICD-10-CM | POA: Diagnosis not present

## 2017-08-07 DIAGNOSIS — R0902 Hypoxemia: Secondary | ICD-10-CM | POA: Diagnosis not present

## 2017-08-07 DIAGNOSIS — I5030 Unspecified diastolic (congestive) heart failure: Secondary | ICD-10-CM | POA: Diagnosis not present

## 2017-08-07 DIAGNOSIS — L8990 Pressure ulcer of unspecified site, unspecified stage: Secondary | ICD-10-CM | POA: Diagnosis not present

## 2017-08-07 DIAGNOSIS — R1310 Dysphagia, unspecified: Secondary | ICD-10-CM | POA: Diagnosis not present

## 2017-08-16 DIAGNOSIS — C3490 Malignant neoplasm of unspecified part of unspecified bronchus or lung: Secondary | ICD-10-CM | POA: Diagnosis not present

## 2017-08-16 DIAGNOSIS — E782 Mixed hyperlipidemia: Secondary | ICD-10-CM | POA: Diagnosis not present

## 2017-08-16 DIAGNOSIS — I5042 Chronic combined systolic (congestive) and diastolic (congestive) heart failure: Secondary | ICD-10-CM | POA: Diagnosis not present

## 2017-08-16 DIAGNOSIS — Z950 Presence of cardiac pacemaker: Secondary | ICD-10-CM | POA: Diagnosis not present

## 2017-08-16 DIAGNOSIS — I129 Hypertensive chronic kidney disease with stage 1 through stage 4 chronic kidney disease, or unspecified chronic kidney disease: Secondary | ICD-10-CM | POA: Diagnosis not present

## 2017-08-16 DIAGNOSIS — D631 Anemia in chronic kidney disease: Secondary | ICD-10-CM | POA: Diagnosis not present

## 2017-08-16 DIAGNOSIS — N2581 Secondary hyperparathyroidism of renal origin: Secondary | ICD-10-CM | POA: Diagnosis not present

## 2017-08-16 DIAGNOSIS — I739 Peripheral vascular disease, unspecified: Secondary | ICD-10-CM | POA: Diagnosis not present

## 2017-08-16 DIAGNOSIS — N183 Chronic kidney disease, stage 3 (moderate): Secondary | ICD-10-CM | POA: Diagnosis not present

## 2017-08-16 DIAGNOSIS — J439 Emphysema, unspecified: Secondary | ICD-10-CM | POA: Diagnosis not present

## 2017-08-16 DIAGNOSIS — I480 Paroxysmal atrial fibrillation: Secondary | ICD-10-CM | POA: Diagnosis not present

## 2017-08-16 DIAGNOSIS — E1122 Type 2 diabetes mellitus with diabetic chronic kidney disease: Secondary | ICD-10-CM | POA: Diagnosis not present

## 2017-08-19 DIAGNOSIS — F411 Generalized anxiety disorder: Secondary | ICD-10-CM | POA: Diagnosis not present

## 2017-08-19 DIAGNOSIS — F331 Major depressive disorder, recurrent, moderate: Secondary | ICD-10-CM | POA: Diagnosis not present

## 2017-08-21 ENCOUNTER — Encounter (HOSPITAL_COMMUNITY): Payer: Self-pay | Admitting: Emergency Medicine

## 2017-08-21 ENCOUNTER — Emergency Department (HOSPITAL_COMMUNITY)
Admission: EM | Admit: 2017-08-21 | Discharge: 2017-08-21 | Disposition: A | Discharge status: Home / Self Care | Attending: Emergency Medicine | Admitting: Emergency Medicine

## 2017-08-21 ENCOUNTER — Emergency Department (HOSPITAL_COMMUNITY)

## 2017-08-21 DIAGNOSIS — S3992XA Unspecified injury of lower back, initial encounter: Secondary | ICD-10-CM | POA: Diagnosis present

## 2017-08-21 DIAGNOSIS — Z7901 Long term (current) use of anticoagulants: Secondary | ICD-10-CM | POA: Diagnosis not present

## 2017-08-21 DIAGNOSIS — I2510 Atherosclerotic heart disease of native coronary artery without angina pectoris: Secondary | ICD-10-CM | POA: Insufficient documentation

## 2017-08-21 DIAGNOSIS — Z85828 Personal history of other malignant neoplasm of skin: Secondary | ICD-10-CM | POA: Insufficient documentation

## 2017-08-21 DIAGNOSIS — Z79899 Other long term (current) drug therapy: Secondary | ICD-10-CM | POA: Diagnosis not present

## 2017-08-21 DIAGNOSIS — S81812A Laceration without foreign body, left lower leg, initial encounter: Secondary | ICD-10-CM | POA: Diagnosis not present

## 2017-08-21 DIAGNOSIS — Z96651 Presence of right artificial knee joint: Secondary | ICD-10-CM | POA: Diagnosis not present

## 2017-08-21 DIAGNOSIS — Z7984 Long term (current) use of oral hypoglycemic drugs: Secondary | ICD-10-CM | POA: Insufficient documentation

## 2017-08-21 DIAGNOSIS — F329 Major depressive disorder, single episode, unspecified: Secondary | ICD-10-CM | POA: Diagnosis not present

## 2017-08-21 DIAGNOSIS — Z23 Encounter for immunization: Secondary | ICD-10-CM | POA: Diagnosis not present

## 2017-08-21 DIAGNOSIS — I5032 Chronic diastolic (congestive) heart failure: Secondary | ICD-10-CM | POA: Insufficient documentation

## 2017-08-21 DIAGNOSIS — N183 Chronic kidney disease, stage 3 (moderate): Secondary | ICD-10-CM | POA: Insufficient documentation

## 2017-08-21 DIAGNOSIS — Z96642 Presence of left artificial hip joint: Secondary | ICD-10-CM | POA: Diagnosis not present

## 2017-08-21 DIAGNOSIS — Y9389 Activity, other specified: Secondary | ICD-10-CM | POA: Diagnosis not present

## 2017-08-21 DIAGNOSIS — S32039A Unspecified fracture of third lumbar vertebra, initial encounter for closed fracture: Secondary | ICD-10-CM | POA: Diagnosis not present

## 2017-08-21 DIAGNOSIS — W01198A Fall on same level from slipping, tripping and stumbling with subsequent striking against other object, initial encounter: Secondary | ICD-10-CM | POA: Insufficient documentation

## 2017-08-21 DIAGNOSIS — Z950 Presence of cardiac pacemaker: Secondary | ICD-10-CM | POA: Insufficient documentation

## 2017-08-21 DIAGNOSIS — J449 Chronic obstructive pulmonary disease, unspecified: Secondary | ICD-10-CM | POA: Diagnosis not present

## 2017-08-21 DIAGNOSIS — Y998 Other external cause status: Secondary | ICD-10-CM | POA: Diagnosis not present

## 2017-08-21 DIAGNOSIS — Z87891 Personal history of nicotine dependence: Secondary | ICD-10-CM | POA: Diagnosis not present

## 2017-08-21 DIAGNOSIS — E1122 Type 2 diabetes mellitus with diabetic chronic kidney disease: Secondary | ICD-10-CM | POA: Insufficient documentation

## 2017-08-21 DIAGNOSIS — Y92128 Other place in nursing home as the place of occurrence of the external cause: Secondary | ICD-10-CM | POA: Insufficient documentation

## 2017-08-21 DIAGNOSIS — W19XXXA Unspecified fall, initial encounter: Secondary | ICD-10-CM

## 2017-08-21 DIAGNOSIS — S32038A Other fracture of third lumbar vertebra, initial encounter for closed fracture: Secondary | ICD-10-CM | POA: Diagnosis not present

## 2017-08-21 MED ORDER — BACITRACIN ZINC 500 UNIT/GM EX OINT
TOPICAL_OINTMENT | Freq: Two times a day (BID) | CUTANEOUS | Status: DC
  Administered 2017-08-21: 1 via TOPICAL
  Filled 2017-08-21: qty 1.8

## 2017-08-21 MED ORDER — ACETAMINOPHEN 325 MG PO TABS
650.0000 mg | ORAL_TABLET | Freq: Once | ORAL | Status: AC
  Administered 2017-08-21: 650 mg via ORAL
  Filled 2017-08-21: qty 2

## 2017-08-21 MED ORDER — TETANUS-DIPHTH-ACELL PERTUSSIS 5-2.5-18.5 LF-MCG/0.5 IM SUSP
0.5000 mL | Freq: Once | INTRAMUSCULAR | Status: AC
  Administered 2017-08-21: 0.5 mL via INTRAMUSCULAR
  Filled 2017-08-21: qty 0.5
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
## 2017-08-21 NOTE — ED Notes (Signed)
Pt stable and wheeled out for discharge, states understanding follow up. Daughter taking patient back to facility

## 2017-08-21 NOTE — ED Notes (Signed)
Patient transported to X-ray 

## 2017-08-21 NOTE — ED Triage Notes (Signed)
Per EMS pt from [facility] pt was outside lost balance, fell and slid down brick wall, L lower leg skin tear, pt on blood thinners denies LOC or hitting head, pt also c/o back pain.

## 2017-08-25 DIAGNOSIS — F331 Major depressive disorder, recurrent, moderate: Secondary | ICD-10-CM | POA: Diagnosis not present

## 2017-08-30 DIAGNOSIS — F0391 Unspecified dementia with behavioral disturbance: Secondary | ICD-10-CM | POA: Diagnosis not present

## 2017-09-01 DIAGNOSIS — F411 Generalized anxiety disorder: Secondary | ICD-10-CM | POA: Diagnosis not present

## 2017-09-01 DIAGNOSIS — F331 Major depressive disorder, recurrent, moderate: Secondary | ICD-10-CM | POA: Diagnosis not present

## 2017-09-06 DIAGNOSIS — I25810 Atherosclerosis of coronary artery bypass graft(s) without angina pectoris: Secondary | ICD-10-CM | POA: Diagnosis not present

## 2017-09-06 DIAGNOSIS — R1310 Dysphagia, unspecified: Secondary | ICD-10-CM | POA: Diagnosis not present

## 2017-09-06 DIAGNOSIS — I70209 Unspecified atherosclerosis of native arteries of extremities, unspecified extremity: Secondary | ICD-10-CM | POA: Diagnosis not present

## 2017-09-06 DIAGNOSIS — J690 Pneumonitis due to inhalation of food and vomit: Secondary | ICD-10-CM | POA: Diagnosis not present

## 2017-09-06 DIAGNOSIS — C3490 Malignant neoplasm of unspecified part of unspecified bronchus or lung: Secondary | ICD-10-CM | POA: Diagnosis not present

## 2017-09-06 DIAGNOSIS — I679 Cerebrovascular disease, unspecified: Secondary | ICD-10-CM | POA: Diagnosis not present

## 2017-09-06 DIAGNOSIS — E1159 Type 2 diabetes mellitus with other circulatory complications: Secondary | ICD-10-CM | POA: Diagnosis not present

## 2017-09-06 DIAGNOSIS — R0902 Hypoxemia: Secondary | ICD-10-CM | POA: Diagnosis not present

## 2017-09-06 DIAGNOSIS — Z853 Personal history of malignant neoplasm of breast: Secondary | ICD-10-CM | POA: Diagnosis not present

## 2017-09-06 DIAGNOSIS — F0151 Vascular dementia with behavioral disturbance: Secondary | ICD-10-CM | POA: Diagnosis not present

## 2017-09-06 DIAGNOSIS — I4891 Unspecified atrial fibrillation: Secondary | ICD-10-CM | POA: Diagnosis not present

## 2017-09-06 DIAGNOSIS — I5030 Unspecified diastolic (congestive) heart failure: Secondary | ICD-10-CM | POA: Diagnosis not present

## 2017-09-06 DIAGNOSIS — N183 Chronic kidney disease, stage 3 (moderate): Secondary | ICD-10-CM | POA: Diagnosis not present

## 2017-09-06 DIAGNOSIS — L8990 Pressure ulcer of unspecified site, unspecified stage: Secondary | ICD-10-CM | POA: Diagnosis not present

## 2017-09-06 DIAGNOSIS — I495 Sick sinus syndrome: Secondary | ICD-10-CM | POA: Diagnosis not present

## 2017-09-06 DIAGNOSIS — J449 Chronic obstructive pulmonary disease, unspecified: Secondary | ICD-10-CM | POA: Diagnosis not present

## 2017-09-08 DIAGNOSIS — I495 Sick sinus syndrome: Secondary | ICD-10-CM | POA: Diagnosis not present

## 2017-09-08 DIAGNOSIS — J449 Chronic obstructive pulmonary disease, unspecified: Secondary | ICD-10-CM | POA: Diagnosis not present

## 2017-09-08 DIAGNOSIS — I4891 Unspecified atrial fibrillation: Secondary | ICD-10-CM | POA: Diagnosis not present

## 2017-09-08 DIAGNOSIS — C3490 Malignant neoplasm of unspecified part of unspecified bronchus or lung: Secondary | ICD-10-CM | POA: Diagnosis not present

## 2017-09-08 DIAGNOSIS — F0151 Vascular dementia with behavioral disturbance: Secondary | ICD-10-CM | POA: Diagnosis not present

## 2017-09-08 DIAGNOSIS — F331 Major depressive disorder, recurrent, moderate: Secondary | ICD-10-CM | POA: Diagnosis not present

## 2017-09-08 DIAGNOSIS — I5030 Unspecified diastolic (congestive) heart failure: Secondary | ICD-10-CM | POA: Diagnosis not present

## 2017-09-10 DIAGNOSIS — F0151 Vascular dementia with behavioral disturbance: Secondary | ICD-10-CM | POA: Diagnosis not present

## 2017-09-10 DIAGNOSIS — I4891 Unspecified atrial fibrillation: Secondary | ICD-10-CM | POA: Diagnosis not present

## 2017-09-10 DIAGNOSIS — I495 Sick sinus syndrome: Secondary | ICD-10-CM | POA: Diagnosis not present

## 2017-09-10 DIAGNOSIS — C3490 Malignant neoplasm of unspecified part of unspecified bronchus or lung: Secondary | ICD-10-CM | POA: Diagnosis not present

## 2017-09-10 DIAGNOSIS — I5030 Unspecified diastolic (congestive) heart failure: Secondary | ICD-10-CM | POA: Diagnosis not present

## 2017-09-10 DIAGNOSIS — J449 Chronic obstructive pulmonary disease, unspecified: Secondary | ICD-10-CM | POA: Diagnosis not present

## 2017-09-13 DIAGNOSIS — C3490 Malignant neoplasm of unspecified part of unspecified bronchus or lung: Secondary | ICD-10-CM | POA: Diagnosis not present

## 2017-09-13 DIAGNOSIS — I495 Sick sinus syndrome: Secondary | ICD-10-CM | POA: Diagnosis not present

## 2017-09-13 DIAGNOSIS — I4891 Unspecified atrial fibrillation: Secondary | ICD-10-CM | POA: Diagnosis not present

## 2017-09-13 DIAGNOSIS — I5030 Unspecified diastolic (congestive) heart failure: Secondary | ICD-10-CM | POA: Diagnosis not present

## 2017-09-13 DIAGNOSIS — J449 Chronic obstructive pulmonary disease, unspecified: Secondary | ICD-10-CM | POA: Diagnosis not present

## 2017-09-13 DIAGNOSIS — F0151 Vascular dementia with behavioral disturbance: Secondary | ICD-10-CM | POA: Diagnosis not present

## 2017-09-14 ENCOUNTER — Ambulatory Visit: Payer: Medicare Other | Admitting: Oncology

## 2017-09-14 ENCOUNTER — Telehealth: Payer: Self-pay | Admitting: Oncology

## 2017-09-14 NOTE — Telephone Encounter (Signed)
Daughter Zigmund Daniel called in last wk to schedule follow up for patient but unable to come in on the slots [facility] had available - message sent to GM to see if they can work with daughter and pt schedule. GM sent message back and I tried called twice and left vmail twice for daughter Zigmund Daniel to see if the times GM gave would work - no response back yet from daughter.

## 2017-09-15 DIAGNOSIS — I4891 Unspecified atrial fibrillation: Secondary | ICD-10-CM | POA: Diagnosis not present

## 2017-09-15 DIAGNOSIS — F0151 Vascular dementia with behavioral disturbance: Secondary | ICD-10-CM | POA: Diagnosis not present

## 2017-09-15 DIAGNOSIS — I495 Sick sinus syndrome: Secondary | ICD-10-CM | POA: Diagnosis not present

## 2017-09-15 DIAGNOSIS — C3490 Malignant neoplasm of unspecified part of unspecified bronchus or lung: Secondary | ICD-10-CM | POA: Diagnosis not present

## 2017-09-15 DIAGNOSIS — F331 Major depressive disorder, recurrent, moderate: Secondary | ICD-10-CM | POA: Diagnosis not present

## 2017-09-15 DIAGNOSIS — I5030 Unspecified diastolic (congestive) heart failure: Secondary | ICD-10-CM | POA: Diagnosis not present

## 2017-09-15 DIAGNOSIS — J449 Chronic obstructive pulmonary disease, unspecified: Secondary | ICD-10-CM | POA: Diagnosis not present

## 2017-09-17 DIAGNOSIS — I495 Sick sinus syndrome: Secondary | ICD-10-CM | POA: Diagnosis not present

## 2017-09-17 DIAGNOSIS — C3490 Malignant neoplasm of unspecified part of unspecified bronchus or lung: Secondary | ICD-10-CM | POA: Diagnosis not present

## 2017-09-17 DIAGNOSIS — F0151 Vascular dementia with behavioral disturbance: Secondary | ICD-10-CM | POA: Diagnosis not present

## 2017-09-17 DIAGNOSIS — I4891 Unspecified atrial fibrillation: Secondary | ICD-10-CM | POA: Diagnosis not present

## 2017-09-17 DIAGNOSIS — I5030 Unspecified diastolic (congestive) heart failure: Secondary | ICD-10-CM | POA: Diagnosis not present

## 2017-09-17 DIAGNOSIS — J449 Chronic obstructive pulmonary disease, unspecified: Secondary | ICD-10-CM | POA: Diagnosis not present

## 2017-09-20 DIAGNOSIS — F0151 Vascular dementia with behavioral disturbance: Secondary | ICD-10-CM | POA: Diagnosis not present

## 2017-09-20 DIAGNOSIS — C3490 Malignant neoplasm of unspecified part of unspecified bronchus or lung: Secondary | ICD-10-CM | POA: Diagnosis not present

## 2017-09-20 DIAGNOSIS — J449 Chronic obstructive pulmonary disease, unspecified: Secondary | ICD-10-CM | POA: Diagnosis not present

## 2017-09-20 DIAGNOSIS — I4891 Unspecified atrial fibrillation: Secondary | ICD-10-CM | POA: Diagnosis not present

## 2017-09-20 DIAGNOSIS — I5030 Unspecified diastolic (congestive) heart failure: Secondary | ICD-10-CM | POA: Diagnosis not present

## 2017-09-20 DIAGNOSIS — I495 Sick sinus syndrome: Secondary | ICD-10-CM | POA: Diagnosis not present

## 2017-09-21 DIAGNOSIS — I4891 Unspecified atrial fibrillation: Secondary | ICD-10-CM | POA: Diagnosis not present

## 2017-09-21 DIAGNOSIS — F0151 Vascular dementia with behavioral disturbance: Secondary | ICD-10-CM | POA: Diagnosis not present

## 2017-09-21 DIAGNOSIS — I5030 Unspecified diastolic (congestive) heart failure: Secondary | ICD-10-CM | POA: Diagnosis not present

## 2017-09-21 DIAGNOSIS — C3490 Malignant neoplasm of unspecified part of unspecified bronchus or lung: Secondary | ICD-10-CM | POA: Diagnosis not present

## 2017-09-21 DIAGNOSIS — J449 Chronic obstructive pulmonary disease, unspecified: Secondary | ICD-10-CM | POA: Diagnosis not present

## 2017-09-21 DIAGNOSIS — I495 Sick sinus syndrome: Secondary | ICD-10-CM | POA: Diagnosis not present

## 2017-09-22 DIAGNOSIS — I5030 Unspecified diastolic (congestive) heart failure: Secondary | ICD-10-CM | POA: Diagnosis not present

## 2017-09-22 DIAGNOSIS — C3490 Malignant neoplasm of unspecified part of unspecified bronchus or lung: Secondary | ICD-10-CM | POA: Diagnosis not present

## 2017-09-22 DIAGNOSIS — I495 Sick sinus syndrome: Secondary | ICD-10-CM | POA: Diagnosis not present

## 2017-09-22 DIAGNOSIS — J449 Chronic obstructive pulmonary disease, unspecified: Secondary | ICD-10-CM | POA: Diagnosis not present

## 2017-09-22 DIAGNOSIS — F0151 Vascular dementia with behavioral disturbance: Secondary | ICD-10-CM | POA: Diagnosis not present

## 2017-09-22 DIAGNOSIS — I4891 Unspecified atrial fibrillation: Secondary | ICD-10-CM | POA: Diagnosis not present

## 2017-09-24 DIAGNOSIS — J449 Chronic obstructive pulmonary disease, unspecified: Secondary | ICD-10-CM | POA: Diagnosis not present

## 2017-09-24 DIAGNOSIS — I495 Sick sinus syndrome: Secondary | ICD-10-CM | POA: Diagnosis not present

## 2017-09-24 DIAGNOSIS — I4891 Unspecified atrial fibrillation: Secondary | ICD-10-CM | POA: Diagnosis not present

## 2017-09-24 DIAGNOSIS — F0151 Vascular dementia with behavioral disturbance: Secondary | ICD-10-CM | POA: Diagnosis not present

## 2017-09-24 DIAGNOSIS — C3490 Malignant neoplasm of unspecified part of unspecified bronchus or lung: Secondary | ICD-10-CM | POA: Diagnosis not present

## 2017-09-24 DIAGNOSIS — I5030 Unspecified diastolic (congestive) heart failure: Secondary | ICD-10-CM | POA: Diagnosis not present

## 2017-09-27 DIAGNOSIS — J449 Chronic obstructive pulmonary disease, unspecified: Secondary | ICD-10-CM | POA: Diagnosis not present

## 2017-09-27 DIAGNOSIS — I4891 Unspecified atrial fibrillation: Secondary | ICD-10-CM | POA: Diagnosis not present

## 2017-09-27 DIAGNOSIS — C3490 Malignant neoplasm of unspecified part of unspecified bronchus or lung: Secondary | ICD-10-CM | POA: Diagnosis not present

## 2017-09-27 DIAGNOSIS — I495 Sick sinus syndrome: Secondary | ICD-10-CM | POA: Diagnosis not present

## 2017-09-27 DIAGNOSIS — I5030 Unspecified diastolic (congestive) heart failure: Secondary | ICD-10-CM | POA: Diagnosis not present

## 2017-09-27 DIAGNOSIS — F0151 Vascular dementia with behavioral disturbance: Secondary | ICD-10-CM | POA: Diagnosis not present

## 2017-09-29 DIAGNOSIS — I4891 Unspecified atrial fibrillation: Secondary | ICD-10-CM | POA: Diagnosis not present

## 2017-09-29 DIAGNOSIS — J449 Chronic obstructive pulmonary disease, unspecified: Secondary | ICD-10-CM | POA: Diagnosis not present

## 2017-09-29 DIAGNOSIS — F331 Major depressive disorder, recurrent, moderate: Secondary | ICD-10-CM | POA: Diagnosis not present

## 2017-09-29 DIAGNOSIS — F0151 Vascular dementia with behavioral disturbance: Secondary | ICD-10-CM | POA: Diagnosis not present

## 2017-09-29 DIAGNOSIS — I495 Sick sinus syndrome: Secondary | ICD-10-CM | POA: Diagnosis not present

## 2017-09-29 DIAGNOSIS — C3490 Malignant neoplasm of unspecified part of unspecified bronchus or lung: Secondary | ICD-10-CM | POA: Diagnosis not present

## 2017-09-29 DIAGNOSIS — I5030 Unspecified diastolic (congestive) heart failure: Secondary | ICD-10-CM | POA: Diagnosis not present

## 2017-09-30 DIAGNOSIS — J449 Chronic obstructive pulmonary disease, unspecified: Secondary | ICD-10-CM | POA: Diagnosis not present

## 2017-09-30 DIAGNOSIS — I495 Sick sinus syndrome: Secondary | ICD-10-CM | POA: Diagnosis not present

## 2017-09-30 DIAGNOSIS — I4891 Unspecified atrial fibrillation: Secondary | ICD-10-CM | POA: Diagnosis not present

## 2017-09-30 DIAGNOSIS — I5030 Unspecified diastolic (congestive) heart failure: Secondary | ICD-10-CM | POA: Diagnosis not present

## 2017-09-30 DIAGNOSIS — F0151 Vascular dementia with behavioral disturbance: Secondary | ICD-10-CM | POA: Diagnosis not present

## 2017-09-30 DIAGNOSIS — F0391 Unspecified dementia with behavioral disturbance: Secondary | ICD-10-CM | POA: Diagnosis not present

## 2017-09-30 DIAGNOSIS — C3490 Malignant neoplasm of unspecified part of unspecified bronchus or lung: Secondary | ICD-10-CM | POA: Diagnosis not present

## 2017-10-01 DIAGNOSIS — I5030 Unspecified diastolic (congestive) heart failure: Secondary | ICD-10-CM | POA: Diagnosis not present

## 2017-10-01 DIAGNOSIS — F0151 Vascular dementia with behavioral disturbance: Secondary | ICD-10-CM | POA: Diagnosis not present

## 2017-10-01 DIAGNOSIS — I495 Sick sinus syndrome: Secondary | ICD-10-CM | POA: Diagnosis not present

## 2017-10-01 DIAGNOSIS — I4891 Unspecified atrial fibrillation: Secondary | ICD-10-CM | POA: Diagnosis not present

## 2017-10-01 DIAGNOSIS — J449 Chronic obstructive pulmonary disease, unspecified: Secondary | ICD-10-CM | POA: Diagnosis not present

## 2017-10-01 DIAGNOSIS — C3490 Malignant neoplasm of unspecified part of unspecified bronchus or lung: Secondary | ICD-10-CM | POA: Diagnosis not present

## 2017-10-04 DIAGNOSIS — J449 Chronic obstructive pulmonary disease, unspecified: Secondary | ICD-10-CM | POA: Diagnosis not present

## 2017-10-04 DIAGNOSIS — C3490 Malignant neoplasm of unspecified part of unspecified bronchus or lung: Secondary | ICD-10-CM | POA: Diagnosis not present

## 2017-10-04 DIAGNOSIS — L03818 Cellulitis of other sites: Secondary | ICD-10-CM | POA: Diagnosis not present

## 2017-10-04 DIAGNOSIS — I5030 Unspecified diastolic (congestive) heart failure: Secondary | ICD-10-CM | POA: Diagnosis not present

## 2017-10-04 DIAGNOSIS — F0151 Vascular dementia with behavioral disturbance: Secondary | ICD-10-CM | POA: Diagnosis not present

## 2017-10-04 DIAGNOSIS — F419 Anxiety disorder, unspecified: Secondary | ICD-10-CM | POA: Diagnosis not present

## 2017-10-04 DIAGNOSIS — J441 Chronic obstructive pulmonary disease with (acute) exacerbation: Secondary | ICD-10-CM | POA: Diagnosis not present

## 2017-10-04 DIAGNOSIS — R4189 Other symptoms and signs involving cognitive functions and awareness: Secondary | ICD-10-CM | POA: Diagnosis not present

## 2017-10-04 DIAGNOSIS — I495 Sick sinus syndrome: Secondary | ICD-10-CM | POA: Diagnosis not present

## 2017-10-04 DIAGNOSIS — I4891 Unspecified atrial fibrillation: Secondary | ICD-10-CM | POA: Diagnosis not present

## 2017-10-06 DIAGNOSIS — I5030 Unspecified diastolic (congestive) heart failure: Secondary | ICD-10-CM | POA: Diagnosis not present

## 2017-10-06 DIAGNOSIS — J449 Chronic obstructive pulmonary disease, unspecified: Secondary | ICD-10-CM | POA: Diagnosis not present

## 2017-10-06 DIAGNOSIS — I495 Sick sinus syndrome: Secondary | ICD-10-CM | POA: Diagnosis not present

## 2017-10-06 DIAGNOSIS — F0151 Vascular dementia with behavioral disturbance: Secondary | ICD-10-CM | POA: Diagnosis not present

## 2017-10-06 DIAGNOSIS — I4891 Unspecified atrial fibrillation: Secondary | ICD-10-CM | POA: Diagnosis not present

## 2017-10-06 DIAGNOSIS — C3490 Malignant neoplasm of unspecified part of unspecified bronchus or lung: Secondary | ICD-10-CM | POA: Diagnosis not present

## 2017-10-07 DIAGNOSIS — I495 Sick sinus syndrome: Secondary | ICD-10-CM | POA: Diagnosis not present

## 2017-10-07 DIAGNOSIS — C3490 Malignant neoplasm of unspecified part of unspecified bronchus or lung: Secondary | ICD-10-CM | POA: Diagnosis not present

## 2017-10-07 DIAGNOSIS — R0902 Hypoxemia: Secondary | ICD-10-CM | POA: Diagnosis not present

## 2017-10-07 DIAGNOSIS — Z853 Personal history of malignant neoplasm of breast: Secondary | ICD-10-CM | POA: Diagnosis not present

## 2017-10-07 DIAGNOSIS — J449 Chronic obstructive pulmonary disease, unspecified: Secondary | ICD-10-CM | POA: Diagnosis not present

## 2017-10-07 DIAGNOSIS — J690 Pneumonitis due to inhalation of food and vomit: Secondary | ICD-10-CM | POA: Diagnosis not present

## 2017-10-07 DIAGNOSIS — E1159 Type 2 diabetes mellitus with other circulatory complications: Secondary | ICD-10-CM | POA: Diagnosis not present

## 2017-10-07 DIAGNOSIS — I679 Cerebrovascular disease, unspecified: Secondary | ICD-10-CM | POA: Diagnosis not present

## 2017-10-07 DIAGNOSIS — I25810 Atherosclerosis of coronary artery bypass graft(s) without angina pectoris: Secondary | ICD-10-CM | POA: Diagnosis not present

## 2017-10-07 DIAGNOSIS — I4891 Unspecified atrial fibrillation: Secondary | ICD-10-CM | POA: Diagnosis not present

## 2017-10-07 DIAGNOSIS — N183 Chronic kidney disease, stage 3 (moderate): Secondary | ICD-10-CM | POA: Diagnosis not present

## 2017-10-07 DIAGNOSIS — I70209 Unspecified atherosclerosis of native arteries of extremities, unspecified extremity: Secondary | ICD-10-CM | POA: Diagnosis not present

## 2017-10-07 DIAGNOSIS — R1310 Dysphagia, unspecified: Secondary | ICD-10-CM | POA: Diagnosis not present

## 2017-10-07 DIAGNOSIS — F0151 Vascular dementia with behavioral disturbance: Secondary | ICD-10-CM | POA: Diagnosis not present

## 2017-10-07 DIAGNOSIS — L8990 Pressure ulcer of unspecified site, unspecified stage: Secondary | ICD-10-CM | POA: Diagnosis not present

## 2017-10-07 DIAGNOSIS — I5030 Unspecified diastolic (congestive) heart failure: Secondary | ICD-10-CM | POA: Diagnosis not present

## 2017-10-08 DIAGNOSIS — I4891 Unspecified atrial fibrillation: Secondary | ICD-10-CM | POA: Diagnosis not present

## 2017-10-08 DIAGNOSIS — C3490 Malignant neoplasm of unspecified part of unspecified bronchus or lung: Secondary | ICD-10-CM | POA: Diagnosis not present

## 2017-10-08 DIAGNOSIS — F0151 Vascular dementia with behavioral disturbance: Secondary | ICD-10-CM | POA: Diagnosis not present

## 2017-10-08 DIAGNOSIS — I495 Sick sinus syndrome: Secondary | ICD-10-CM | POA: Diagnosis not present

## 2017-10-08 DIAGNOSIS — I5030 Unspecified diastolic (congestive) heart failure: Secondary | ICD-10-CM | POA: Diagnosis not present

## 2017-10-08 DIAGNOSIS — J449 Chronic obstructive pulmonary disease, unspecified: Secondary | ICD-10-CM | POA: Diagnosis not present

## 2017-10-11 DIAGNOSIS — I5030 Unspecified diastolic (congestive) heart failure: Secondary | ICD-10-CM | POA: Diagnosis not present

## 2017-10-11 DIAGNOSIS — J449 Chronic obstructive pulmonary disease, unspecified: Secondary | ICD-10-CM | POA: Diagnosis not present

## 2017-10-11 DIAGNOSIS — C3490 Malignant neoplasm of unspecified part of unspecified bronchus or lung: Secondary | ICD-10-CM | POA: Diagnosis not present

## 2017-10-11 DIAGNOSIS — I495 Sick sinus syndrome: Secondary | ICD-10-CM | POA: Diagnosis not present

## 2017-10-11 DIAGNOSIS — I4891 Unspecified atrial fibrillation: Secondary | ICD-10-CM | POA: Diagnosis not present

## 2017-10-11 DIAGNOSIS — F0151 Vascular dementia with behavioral disturbance: Secondary | ICD-10-CM | POA: Diagnosis not present

## 2017-10-13 DIAGNOSIS — F331 Major depressive disorder, recurrent, moderate: Secondary | ICD-10-CM | POA: Diagnosis not present

## 2017-10-13 DIAGNOSIS — I5030 Unspecified diastolic (congestive) heart failure: Secondary | ICD-10-CM | POA: Diagnosis not present

## 2017-10-13 DIAGNOSIS — I495 Sick sinus syndrome: Secondary | ICD-10-CM | POA: Diagnosis not present

## 2017-10-13 DIAGNOSIS — J449 Chronic obstructive pulmonary disease, unspecified: Secondary | ICD-10-CM | POA: Diagnosis not present

## 2017-10-13 DIAGNOSIS — F0151 Vascular dementia with behavioral disturbance: Secondary | ICD-10-CM | POA: Diagnosis not present

## 2017-10-13 DIAGNOSIS — I4891 Unspecified atrial fibrillation: Secondary | ICD-10-CM | POA: Diagnosis not present

## 2017-10-13 DIAGNOSIS — C3490 Malignant neoplasm of unspecified part of unspecified bronchus or lung: Secondary | ICD-10-CM | POA: Diagnosis not present

## 2017-10-15 DIAGNOSIS — I495 Sick sinus syndrome: Secondary | ICD-10-CM | POA: Diagnosis not present

## 2017-10-15 DIAGNOSIS — F0151 Vascular dementia with behavioral disturbance: Secondary | ICD-10-CM | POA: Diagnosis not present

## 2017-10-15 DIAGNOSIS — C3490 Malignant neoplasm of unspecified part of unspecified bronchus or lung: Secondary | ICD-10-CM | POA: Diagnosis not present

## 2017-10-15 DIAGNOSIS — I5030 Unspecified diastolic (congestive) heart failure: Secondary | ICD-10-CM | POA: Diagnosis not present

## 2017-10-15 DIAGNOSIS — J449 Chronic obstructive pulmonary disease, unspecified: Secondary | ICD-10-CM | POA: Diagnosis not present

## 2017-10-15 DIAGNOSIS — I4891 Unspecified atrial fibrillation: Secondary | ICD-10-CM | POA: Diagnosis not present

## 2017-10-18 DIAGNOSIS — I509 Heart failure, unspecified: Secondary | ICD-10-CM | POA: Diagnosis not present

## 2017-10-18 DIAGNOSIS — R5381 Other malaise: Secondary | ICD-10-CM | POA: Diagnosis not present

## 2017-10-18 DIAGNOSIS — I4891 Unspecified atrial fibrillation: Secondary | ICD-10-CM | POA: Diagnosis not present

## 2017-10-18 DIAGNOSIS — F0151 Vascular dementia with behavioral disturbance: Secondary | ICD-10-CM | POA: Diagnosis not present

## 2017-10-18 DIAGNOSIS — C3490 Malignant neoplasm of unspecified part of unspecified bronchus or lung: Secondary | ICD-10-CM | POA: Diagnosis not present

## 2017-10-18 DIAGNOSIS — F0390 Unspecified dementia without behavioral disturbance: Secondary | ICD-10-CM | POA: Diagnosis not present

## 2017-10-18 DIAGNOSIS — J449 Chronic obstructive pulmonary disease, unspecified: Secondary | ICD-10-CM | POA: Diagnosis not present

## 2017-10-18 DIAGNOSIS — I5030 Unspecified diastolic (congestive) heart failure: Secondary | ICD-10-CM | POA: Diagnosis not present

## 2017-10-18 DIAGNOSIS — I495 Sick sinus syndrome: Secondary | ICD-10-CM | POA: Diagnosis not present

## 2017-10-23 DIAGNOSIS — J449 Chronic obstructive pulmonary disease, unspecified: Secondary | ICD-10-CM | POA: Diagnosis not present

## 2017-10-23 DIAGNOSIS — I5030 Unspecified diastolic (congestive) heart failure: Secondary | ICD-10-CM | POA: Diagnosis not present

## 2017-10-23 DIAGNOSIS — C3490 Malignant neoplasm of unspecified part of unspecified bronchus or lung: Secondary | ICD-10-CM | POA: Diagnosis not present

## 2017-10-23 DIAGNOSIS — I495 Sick sinus syndrome: Secondary | ICD-10-CM | POA: Diagnosis not present

## 2017-10-23 DIAGNOSIS — F0151 Vascular dementia with behavioral disturbance: Secondary | ICD-10-CM | POA: Diagnosis not present

## 2017-10-23 DIAGNOSIS — I4891 Unspecified atrial fibrillation: Secondary | ICD-10-CM | POA: Diagnosis not present

## 2017-10-25 DIAGNOSIS — I495 Sick sinus syndrome: Secondary | ICD-10-CM | POA: Diagnosis not present

## 2017-10-25 DIAGNOSIS — J449 Chronic obstructive pulmonary disease, unspecified: Secondary | ICD-10-CM | POA: Diagnosis not present

## 2017-10-25 DIAGNOSIS — I5030 Unspecified diastolic (congestive) heart failure: Secondary | ICD-10-CM | POA: Diagnosis not present

## 2017-10-25 DIAGNOSIS — F0151 Vascular dementia with behavioral disturbance: Secondary | ICD-10-CM | POA: Diagnosis not present

## 2017-10-25 DIAGNOSIS — C3490 Malignant neoplasm of unspecified part of unspecified bronchus or lung: Secondary | ICD-10-CM | POA: Diagnosis not present

## 2017-10-25 DIAGNOSIS — I4891 Unspecified atrial fibrillation: Secondary | ICD-10-CM | POA: Diagnosis not present

## 2017-10-27 DIAGNOSIS — J449 Chronic obstructive pulmonary disease, unspecified: Secondary | ICD-10-CM | POA: Diagnosis not present

## 2017-10-27 DIAGNOSIS — I4891 Unspecified atrial fibrillation: Secondary | ICD-10-CM | POA: Diagnosis not present

## 2017-10-27 DIAGNOSIS — C3490 Malignant neoplasm of unspecified part of unspecified bronchus or lung: Secondary | ICD-10-CM | POA: Diagnosis not present

## 2017-10-27 DIAGNOSIS — I5030 Unspecified diastolic (congestive) heart failure: Secondary | ICD-10-CM | POA: Diagnosis not present

## 2017-10-27 DIAGNOSIS — F0151 Vascular dementia with behavioral disturbance: Secondary | ICD-10-CM | POA: Diagnosis not present

## 2017-10-27 DIAGNOSIS — I495 Sick sinus syndrome: Secondary | ICD-10-CM | POA: Diagnosis not present

## 2017-10-27 DIAGNOSIS — F331 Major depressive disorder, recurrent, moderate: Secondary | ICD-10-CM | POA: Diagnosis not present

## 2017-10-27 DIAGNOSIS — F411 Generalized anxiety disorder: Secondary | ICD-10-CM | POA: Diagnosis not present

## 2017-10-29 DIAGNOSIS — J449 Chronic obstructive pulmonary disease, unspecified: Secondary | ICD-10-CM | POA: Diagnosis not present

## 2017-10-29 DIAGNOSIS — I5030 Unspecified diastolic (congestive) heart failure: Secondary | ICD-10-CM | POA: Diagnosis not present

## 2017-10-29 DIAGNOSIS — F0151 Vascular dementia with behavioral disturbance: Secondary | ICD-10-CM | POA: Diagnosis not present

## 2017-10-29 DIAGNOSIS — C3490 Malignant neoplasm of unspecified part of unspecified bronchus or lung: Secondary | ICD-10-CM | POA: Diagnosis not present

## 2017-10-29 DIAGNOSIS — I495 Sick sinus syndrome: Secondary | ICD-10-CM | POA: Diagnosis not present

## 2017-10-29 DIAGNOSIS — I4891 Unspecified atrial fibrillation: Secondary | ICD-10-CM | POA: Diagnosis not present

## 2017-11-01 DIAGNOSIS — I4891 Unspecified atrial fibrillation: Secondary | ICD-10-CM | POA: Diagnosis not present

## 2017-11-01 DIAGNOSIS — I509 Heart failure, unspecified: Secondary | ICD-10-CM | POA: Diagnosis not present

## 2017-11-01 DIAGNOSIS — J449 Chronic obstructive pulmonary disease, unspecified: Secondary | ICD-10-CM | POA: Diagnosis not present

## 2017-11-01 DIAGNOSIS — I495 Sick sinus syndrome: Secondary | ICD-10-CM | POA: Diagnosis not present

## 2017-11-01 DIAGNOSIS — T148XXA Other injury of unspecified body region, initial encounter: Secondary | ICD-10-CM | POA: Diagnosis not present

## 2017-11-01 DIAGNOSIS — C3490 Malignant neoplasm of unspecified part of unspecified bronchus or lung: Secondary | ICD-10-CM | POA: Diagnosis not present

## 2017-11-01 DIAGNOSIS — I5030 Unspecified diastolic (congestive) heart failure: Secondary | ICD-10-CM | POA: Diagnosis not present

## 2017-11-01 DIAGNOSIS — F0151 Vascular dementia with behavioral disturbance: Secondary | ICD-10-CM | POA: Diagnosis not present

## 2017-11-01 DIAGNOSIS — B379 Candidiasis, unspecified: Secondary | ICD-10-CM | POA: Diagnosis not present

## 2017-11-03 DIAGNOSIS — C3490 Malignant neoplasm of unspecified part of unspecified bronchus or lung: Secondary | ICD-10-CM | POA: Diagnosis not present

## 2017-11-03 DIAGNOSIS — J449 Chronic obstructive pulmonary disease, unspecified: Secondary | ICD-10-CM | POA: Diagnosis not present

## 2017-11-03 DIAGNOSIS — I4891 Unspecified atrial fibrillation: Secondary | ICD-10-CM | POA: Diagnosis not present

## 2017-11-03 DIAGNOSIS — F0151 Vascular dementia with behavioral disturbance: Secondary | ICD-10-CM | POA: Diagnosis not present

## 2017-11-03 DIAGNOSIS — I495 Sick sinus syndrome: Secondary | ICD-10-CM | POA: Diagnosis not present

## 2017-11-03 DIAGNOSIS — I5030 Unspecified diastolic (congestive) heart failure: Secondary | ICD-10-CM | POA: Diagnosis not present

## 2017-11-03 DIAGNOSIS — F331 Major depressive disorder, recurrent, moderate: Secondary | ICD-10-CM | POA: Diagnosis not present

## 2017-11-04 DIAGNOSIS — F0151 Vascular dementia with behavioral disturbance: Secondary | ICD-10-CM | POA: Diagnosis not present

## 2017-11-04 DIAGNOSIS — I495 Sick sinus syndrome: Secondary | ICD-10-CM | POA: Diagnosis not present

## 2017-11-04 DIAGNOSIS — I5030 Unspecified diastolic (congestive) heart failure: Secondary | ICD-10-CM | POA: Diagnosis not present

## 2017-11-04 DIAGNOSIS — I4891 Unspecified atrial fibrillation: Secondary | ICD-10-CM | POA: Diagnosis not present

## 2017-11-04 DIAGNOSIS — C3490 Malignant neoplasm of unspecified part of unspecified bronchus or lung: Secondary | ICD-10-CM | POA: Diagnosis not present

## 2017-11-04 DIAGNOSIS — J449 Chronic obstructive pulmonary disease, unspecified: Secondary | ICD-10-CM | POA: Diagnosis not present

## 2017-11-05 DIAGNOSIS — C3490 Malignant neoplasm of unspecified part of unspecified bronchus or lung: Secondary | ICD-10-CM | POA: Diagnosis not present

## 2017-11-05 DIAGNOSIS — F0151 Vascular dementia with behavioral disturbance: Secondary | ICD-10-CM | POA: Diagnosis not present

## 2017-11-05 DIAGNOSIS — I5030 Unspecified diastolic (congestive) heart failure: Secondary | ICD-10-CM | POA: Diagnosis not present

## 2017-11-05 DIAGNOSIS — I4891 Unspecified atrial fibrillation: Secondary | ICD-10-CM | POA: Diagnosis not present

## 2017-11-05 DIAGNOSIS — J449 Chronic obstructive pulmonary disease, unspecified: Secondary | ICD-10-CM | POA: Diagnosis not present

## 2017-11-05 DIAGNOSIS — I495 Sick sinus syndrome: Secondary | ICD-10-CM | POA: Diagnosis not present

## 2017-11-06 DIAGNOSIS — R509 Fever, unspecified: Secondary | ICD-10-CM | POA: Diagnosis not present

## 2017-11-06 DIAGNOSIS — J449 Chronic obstructive pulmonary disease, unspecified: Secondary | ICD-10-CM | POA: Diagnosis not present

## 2017-11-06 DIAGNOSIS — I5030 Unspecified diastolic (congestive) heart failure: Secondary | ICD-10-CM | POA: Diagnosis not present

## 2017-11-06 DIAGNOSIS — C3490 Malignant neoplasm of unspecified part of unspecified bronchus or lung: Secondary | ICD-10-CM | POA: Diagnosis not present

## 2017-11-06 DIAGNOSIS — R0902 Hypoxemia: Secondary | ICD-10-CM | POA: Diagnosis not present

## 2017-11-06 DIAGNOSIS — I495 Sick sinus syndrome: Secondary | ICD-10-CM | POA: Diagnosis not present

## 2017-11-06 DIAGNOSIS — F0151 Vascular dementia with behavioral disturbance: Secondary | ICD-10-CM | POA: Diagnosis not present

## 2017-11-06 DIAGNOSIS — I4891 Unspecified atrial fibrillation: Secondary | ICD-10-CM | POA: Diagnosis not present

## 2017-11-07 DIAGNOSIS — I5030 Unspecified diastolic (congestive) heart failure: Secondary | ICD-10-CM | POA: Diagnosis not present

## 2017-11-07 DIAGNOSIS — J449 Chronic obstructive pulmonary disease, unspecified: Secondary | ICD-10-CM | POA: Diagnosis not present

## 2017-11-07 DIAGNOSIS — J690 Pneumonitis due to inhalation of food and vomit: Secondary | ICD-10-CM | POA: Diagnosis not present

## 2017-11-07 DIAGNOSIS — Z853 Personal history of malignant neoplasm of breast: Secondary | ICD-10-CM | POA: Diagnosis not present

## 2017-11-07 DIAGNOSIS — N183 Chronic kidney disease, stage 3 (moderate): Secondary | ICD-10-CM | POA: Diagnosis not present

## 2017-11-07 DIAGNOSIS — I679 Cerebrovascular disease, unspecified: Secondary | ICD-10-CM | POA: Diagnosis not present

## 2017-11-07 DIAGNOSIS — I495 Sick sinus syndrome: Secondary | ICD-10-CM | POA: Diagnosis not present

## 2017-11-07 DIAGNOSIS — I25810 Atherosclerosis of coronary artery bypass graft(s) without angina pectoris: Secondary | ICD-10-CM | POA: Diagnosis not present

## 2017-11-07 DIAGNOSIS — R1310 Dysphagia, unspecified: Secondary | ICD-10-CM | POA: Diagnosis not present

## 2017-11-07 DIAGNOSIS — L8990 Pressure ulcer of unspecified site, unspecified stage: Secondary | ICD-10-CM | POA: Diagnosis not present

## 2017-11-07 DIAGNOSIS — I4891 Unspecified atrial fibrillation: Secondary | ICD-10-CM | POA: Diagnosis not present

## 2017-11-07 DIAGNOSIS — I70209 Unspecified atherosclerosis of native arteries of extremities, unspecified extremity: Secondary | ICD-10-CM | POA: Diagnosis not present

## 2017-11-07 DIAGNOSIS — E1159 Type 2 diabetes mellitus with other circulatory complications: Secondary | ICD-10-CM | POA: Diagnosis not present

## 2017-11-07 DIAGNOSIS — F0151 Vascular dementia with behavioral disturbance: Secondary | ICD-10-CM | POA: Diagnosis not present

## 2017-11-07 DIAGNOSIS — R0902 Hypoxemia: Secondary | ICD-10-CM | POA: Diagnosis not present

## 2017-11-07 DIAGNOSIS — C3490 Malignant neoplasm of unspecified part of unspecified bronchus or lung: Secondary | ICD-10-CM | POA: Diagnosis not present

## 2017-11-09 DIAGNOSIS — I4891 Unspecified atrial fibrillation: Secondary | ICD-10-CM | POA: Diagnosis not present

## 2017-11-09 DIAGNOSIS — I5030 Unspecified diastolic (congestive) heart failure: Secondary | ICD-10-CM | POA: Diagnosis not present

## 2017-11-09 DIAGNOSIS — C3490 Malignant neoplasm of unspecified part of unspecified bronchus or lung: Secondary | ICD-10-CM | POA: Diagnosis not present

## 2017-11-09 DIAGNOSIS — J449 Chronic obstructive pulmonary disease, unspecified: Secondary | ICD-10-CM | POA: Diagnosis not present

## 2017-11-09 DIAGNOSIS — I495 Sick sinus syndrome: Secondary | ICD-10-CM | POA: Diagnosis not present

## 2017-11-09 DIAGNOSIS — F0151 Vascular dementia with behavioral disturbance: Secondary | ICD-10-CM | POA: Diagnosis not present

## 2017-11-10 DIAGNOSIS — F331 Major depressive disorder, recurrent, moderate: Secondary | ICD-10-CM | POA: Diagnosis not present

## 2017-11-12 DIAGNOSIS — I4891 Unspecified atrial fibrillation: Secondary | ICD-10-CM | POA: Diagnosis not present

## 2017-11-12 DIAGNOSIS — J449 Chronic obstructive pulmonary disease, unspecified: Secondary | ICD-10-CM | POA: Diagnosis not present

## 2017-11-12 DIAGNOSIS — I5030 Unspecified diastolic (congestive) heart failure: Secondary | ICD-10-CM | POA: Diagnosis not present

## 2017-11-12 DIAGNOSIS — F0151 Vascular dementia with behavioral disturbance: Secondary | ICD-10-CM | POA: Diagnosis not present

## 2017-11-12 DIAGNOSIS — I495 Sick sinus syndrome: Secondary | ICD-10-CM | POA: Diagnosis not present

## 2017-11-12 DIAGNOSIS — C3490 Malignant neoplasm of unspecified part of unspecified bronchus or lung: Secondary | ICD-10-CM | POA: Diagnosis not present

## 2017-11-15 DIAGNOSIS — I5030 Unspecified diastolic (congestive) heart failure: Secondary | ICD-10-CM | POA: Diagnosis not present

## 2017-11-15 DIAGNOSIS — I4891 Unspecified atrial fibrillation: Secondary | ICD-10-CM | POA: Diagnosis not present

## 2017-11-15 DIAGNOSIS — C3490 Malignant neoplasm of unspecified part of unspecified bronchus or lung: Secondary | ICD-10-CM | POA: Diagnosis not present

## 2017-11-15 DIAGNOSIS — J449 Chronic obstructive pulmonary disease, unspecified: Secondary | ICD-10-CM | POA: Diagnosis not present

## 2017-11-15 DIAGNOSIS — F0151 Vascular dementia with behavioral disturbance: Secondary | ICD-10-CM | POA: Diagnosis not present

## 2017-11-15 DIAGNOSIS — I495 Sick sinus syndrome: Secondary | ICD-10-CM | POA: Diagnosis not present

## 2017-11-17 DIAGNOSIS — F0151 Vascular dementia with behavioral disturbance: Secondary | ICD-10-CM | POA: Diagnosis not present

## 2017-11-17 DIAGNOSIS — J449 Chronic obstructive pulmonary disease, unspecified: Secondary | ICD-10-CM | POA: Diagnosis not present

## 2017-11-17 DIAGNOSIS — F411 Generalized anxiety disorder: Secondary | ICD-10-CM | POA: Diagnosis not present

## 2017-11-17 DIAGNOSIS — F331 Major depressive disorder, recurrent, moderate: Secondary | ICD-10-CM | POA: Diagnosis not present

## 2017-11-17 DIAGNOSIS — I4891 Unspecified atrial fibrillation: Secondary | ICD-10-CM | POA: Diagnosis not present

## 2017-11-17 DIAGNOSIS — I5030 Unspecified diastolic (congestive) heart failure: Secondary | ICD-10-CM | POA: Diagnosis not present

## 2017-11-17 DIAGNOSIS — I495 Sick sinus syndrome: Secondary | ICD-10-CM | POA: Diagnosis not present

## 2017-11-17 DIAGNOSIS — C3490 Malignant neoplasm of unspecified part of unspecified bronchus or lung: Secondary | ICD-10-CM | POA: Diagnosis not present

## 2017-11-19 DIAGNOSIS — I5030 Unspecified diastolic (congestive) heart failure: Secondary | ICD-10-CM | POA: Diagnosis not present

## 2017-11-19 DIAGNOSIS — F0151 Vascular dementia with behavioral disturbance: Secondary | ICD-10-CM | POA: Diagnosis not present

## 2017-11-19 DIAGNOSIS — J449 Chronic obstructive pulmonary disease, unspecified: Secondary | ICD-10-CM | POA: Diagnosis not present

## 2017-11-19 DIAGNOSIS — I495 Sick sinus syndrome: Secondary | ICD-10-CM | POA: Diagnosis not present

## 2017-11-19 DIAGNOSIS — I4891 Unspecified atrial fibrillation: Secondary | ICD-10-CM | POA: Diagnosis not present

## 2017-11-19 DIAGNOSIS — C3490 Malignant neoplasm of unspecified part of unspecified bronchus or lung: Secondary | ICD-10-CM | POA: Diagnosis not present

## 2017-11-22 DIAGNOSIS — I5030 Unspecified diastolic (congestive) heart failure: Secondary | ICD-10-CM | POA: Diagnosis not present

## 2017-11-22 DIAGNOSIS — F0151 Vascular dementia with behavioral disturbance: Secondary | ICD-10-CM | POA: Diagnosis not present

## 2017-11-22 DIAGNOSIS — J449 Chronic obstructive pulmonary disease, unspecified: Secondary | ICD-10-CM | POA: Diagnosis not present

## 2017-11-22 DIAGNOSIS — C3490 Malignant neoplasm of unspecified part of unspecified bronchus or lung: Secondary | ICD-10-CM | POA: Diagnosis not present

## 2017-11-22 DIAGNOSIS — I4891 Unspecified atrial fibrillation: Secondary | ICD-10-CM | POA: Diagnosis not present

## 2017-11-22 DIAGNOSIS — I495 Sick sinus syndrome: Secondary | ICD-10-CM | POA: Diagnosis not present

## 2017-11-24 DIAGNOSIS — F331 Major depressive disorder, recurrent, moderate: Secondary | ICD-10-CM | POA: Diagnosis not present

## 2017-11-24 DIAGNOSIS — F411 Generalized anxiety disorder: Secondary | ICD-10-CM | POA: Diagnosis not present

## 2017-11-29 DIAGNOSIS — I495 Sick sinus syndrome: Secondary | ICD-10-CM | POA: Diagnosis not present

## 2017-11-29 DIAGNOSIS — I4891 Unspecified atrial fibrillation: Secondary | ICD-10-CM | POA: Diagnosis not present

## 2017-11-29 DIAGNOSIS — F0151 Vascular dementia with behavioral disturbance: Secondary | ICD-10-CM | POA: Diagnosis not present

## 2017-11-29 DIAGNOSIS — C3490 Malignant neoplasm of unspecified part of unspecified bronchus or lung: Secondary | ICD-10-CM | POA: Diagnosis not present

## 2017-11-29 DIAGNOSIS — I5030 Unspecified diastolic (congestive) heart failure: Secondary | ICD-10-CM | POA: Diagnosis not present

## 2017-11-29 DIAGNOSIS — J449 Chronic obstructive pulmonary disease, unspecified: Secondary | ICD-10-CM | POA: Diagnosis not present

## 2017-12-01 DIAGNOSIS — F331 Major depressive disorder, recurrent, moderate: Secondary | ICD-10-CM | POA: Diagnosis not present

## 2017-12-02 DIAGNOSIS — I5030 Unspecified diastolic (congestive) heart failure: Secondary | ICD-10-CM | POA: Diagnosis not present

## 2017-12-02 DIAGNOSIS — I495 Sick sinus syndrome: Secondary | ICD-10-CM | POA: Diagnosis not present

## 2017-12-02 DIAGNOSIS — F0151 Vascular dementia with behavioral disturbance: Secondary | ICD-10-CM | POA: Diagnosis not present

## 2017-12-02 DIAGNOSIS — C3490 Malignant neoplasm of unspecified part of unspecified bronchus or lung: Secondary | ICD-10-CM | POA: Diagnosis not present

## 2017-12-02 DIAGNOSIS — J449 Chronic obstructive pulmonary disease, unspecified: Secondary | ICD-10-CM | POA: Diagnosis not present

## 2017-12-02 DIAGNOSIS — I4891 Unspecified atrial fibrillation: Secondary | ICD-10-CM | POA: Diagnosis not present

## 2017-12-06 DIAGNOSIS — I4891 Unspecified atrial fibrillation: Secondary | ICD-10-CM | POA: Diagnosis not present

## 2017-12-06 DIAGNOSIS — I5030 Unspecified diastolic (congestive) heart failure: Secondary | ICD-10-CM | POA: Diagnosis not present

## 2017-12-06 DIAGNOSIS — C3490 Malignant neoplasm of unspecified part of unspecified bronchus or lung: Secondary | ICD-10-CM | POA: Diagnosis not present

## 2017-12-06 DIAGNOSIS — J449 Chronic obstructive pulmonary disease, unspecified: Secondary | ICD-10-CM | POA: Diagnosis not present

## 2017-12-06 DIAGNOSIS — F0151 Vascular dementia with behavioral disturbance: Secondary | ICD-10-CM | POA: Diagnosis not present

## 2017-12-06 DIAGNOSIS — I495 Sick sinus syndrome: Secondary | ICD-10-CM | POA: Diagnosis not present

## 2017-12-07 DIAGNOSIS — Z853 Personal history of malignant neoplasm of breast: Secondary | ICD-10-CM | POA: Diagnosis not present

## 2017-12-07 DIAGNOSIS — C3490 Malignant neoplasm of unspecified part of unspecified bronchus or lung: Secondary | ICD-10-CM | POA: Diagnosis not present

## 2017-12-07 DIAGNOSIS — R1310 Dysphagia, unspecified: Secondary | ICD-10-CM | POA: Diagnosis not present

## 2017-12-07 DIAGNOSIS — J449 Chronic obstructive pulmonary disease, unspecified: Secondary | ICD-10-CM | POA: Diagnosis not present

## 2017-12-07 DIAGNOSIS — I4891 Unspecified atrial fibrillation: Secondary | ICD-10-CM | POA: Diagnosis not present

## 2017-12-07 DIAGNOSIS — I70209 Unspecified atherosclerosis of native arteries of extremities, unspecified extremity: Secondary | ICD-10-CM | POA: Diagnosis not present

## 2017-12-07 DIAGNOSIS — I5030 Unspecified diastolic (congestive) heart failure: Secondary | ICD-10-CM | POA: Diagnosis not present

## 2017-12-07 DIAGNOSIS — F0151 Vascular dementia with behavioral disturbance: Secondary | ICD-10-CM | POA: Diagnosis not present

## 2017-12-07 DIAGNOSIS — I679 Cerebrovascular disease, unspecified: Secondary | ICD-10-CM | POA: Diagnosis not present

## 2017-12-07 DIAGNOSIS — I495 Sick sinus syndrome: Secondary | ICD-10-CM | POA: Diagnosis not present

## 2017-12-07 DIAGNOSIS — R0902 Hypoxemia: Secondary | ICD-10-CM | POA: Diagnosis not present

## 2017-12-07 DIAGNOSIS — E1159 Type 2 diabetes mellitus with other circulatory complications: Secondary | ICD-10-CM | POA: Diagnosis not present

## 2017-12-07 DIAGNOSIS — J690 Pneumonitis due to inhalation of food and vomit: Secondary | ICD-10-CM | POA: Diagnosis not present

## 2017-12-07 DIAGNOSIS — L8990 Pressure ulcer of unspecified site, unspecified stage: Secondary | ICD-10-CM | POA: Diagnosis not present

## 2017-12-07 DIAGNOSIS — N183 Chronic kidney disease, stage 3 (moderate): Secondary | ICD-10-CM | POA: Diagnosis not present

## 2017-12-07 DIAGNOSIS — I25810 Atherosclerosis of coronary artery bypass graft(s) without angina pectoris: Secondary | ICD-10-CM | POA: Diagnosis not present

## 2017-12-16 DIAGNOSIS — F0150 Vascular dementia without behavioral disturbance: Secondary | ICD-10-CM | POA: Diagnosis not present

## 2017-12-18 DIAGNOSIS — F411 Generalized anxiety disorder: Secondary | ICD-10-CM | POA: Diagnosis not present

## 2017-12-18 DIAGNOSIS — F331 Major depressive disorder, recurrent, moderate: Secondary | ICD-10-CM | POA: Diagnosis not present

## 2017-12-25 DIAGNOSIS — F331 Major depressive disorder, recurrent, moderate: Secondary | ICD-10-CM | POA: Diagnosis not present

## 2017-12-25 DIAGNOSIS — F411 Generalized anxiety disorder: Secondary | ICD-10-CM | POA: Diagnosis not present

## 2017-12-26 ENCOUNTER — Encounter (HOSPITAL_COMMUNITY): Payer: Self-pay | Admitting: Emergency Medicine

## 2017-12-26 ENCOUNTER — Emergency Department (HOSPITAL_COMMUNITY)

## 2017-12-26 ENCOUNTER — Other Ambulatory Visit: Payer: Self-pay

## 2017-12-26 ENCOUNTER — Emergency Department (HOSPITAL_COMMUNITY)
Admission: EM | Admit: 2017-12-26 | Discharge: 2017-12-26 | Disposition: A | Discharge status: Home / Self Care | Attending: Emergency Medicine | Admitting: Emergency Medicine

## 2017-12-26 DIAGNOSIS — E1122 Type 2 diabetes mellitus with diabetic chronic kidney disease: Secondary | ICD-10-CM | POA: Diagnosis not present

## 2017-12-26 DIAGNOSIS — M2550 Pain in unspecified joint: Secondary | ICD-10-CM | POA: Diagnosis not present

## 2017-12-26 DIAGNOSIS — R0902 Hypoxemia: Secondary | ICD-10-CM | POA: Diagnosis not present

## 2017-12-26 DIAGNOSIS — M79621 Pain in right upper arm: Secondary | ICD-10-CM | POA: Diagnosis not present

## 2017-12-26 DIAGNOSIS — S0990XA Unspecified injury of head, initial encounter: Secondary | ICD-10-CM | POA: Diagnosis not present

## 2017-12-26 DIAGNOSIS — Y92129 Unspecified place in nursing home as the place of occurrence of the external cause: Secondary | ICD-10-CM | POA: Insufficient documentation

## 2017-12-26 DIAGNOSIS — S41111A Laceration without foreign body of right upper arm, initial encounter: Secondary | ICD-10-CM | POA: Diagnosis not present

## 2017-12-26 DIAGNOSIS — W19XXXA Unspecified fall, initial encounter: Secondary | ICD-10-CM

## 2017-12-26 DIAGNOSIS — S81812A Laceration without foreign body, left lower leg, initial encounter: Secondary | ICD-10-CM | POA: Diagnosis not present

## 2017-12-26 DIAGNOSIS — I2510 Atherosclerotic heart disease of native coronary artery without angina pectoris: Secondary | ICD-10-CM | POA: Insufficient documentation

## 2017-12-26 DIAGNOSIS — M79632 Pain in left forearm: Secondary | ICD-10-CM | POA: Diagnosis not present

## 2017-12-26 DIAGNOSIS — Y9301 Activity, walking, marching and hiking: Secondary | ICD-10-CM | POA: Diagnosis not present

## 2017-12-26 DIAGNOSIS — W0110XA Fall on same level from slipping, tripping and stumbling with subsequent striking against unspecified object, initial encounter: Secondary | ICD-10-CM | POA: Insufficient documentation

## 2017-12-26 DIAGNOSIS — S51812A Laceration without foreign body of left forearm, initial encounter: Secondary | ICD-10-CM | POA: Diagnosis not present

## 2017-12-26 DIAGNOSIS — N183 Chronic kidney disease, stage 3 (moderate): Secondary | ICD-10-CM | POA: Diagnosis not present

## 2017-12-26 DIAGNOSIS — Z79899 Other long term (current) drug therapy: Secondary | ICD-10-CM | POA: Diagnosis not present

## 2017-12-26 DIAGNOSIS — S8992XA Unspecified injury of left lower leg, initial encounter: Secondary | ICD-10-CM | POA: Diagnosis not present

## 2017-12-26 DIAGNOSIS — S59912A Unspecified injury of left forearm, initial encounter: Secondary | ICD-10-CM | POA: Diagnosis not present

## 2017-12-26 DIAGNOSIS — R52 Pain, unspecified: Secondary | ICD-10-CM | POA: Diagnosis not present

## 2017-12-26 DIAGNOSIS — S41112A Laceration without foreign body of left upper arm, initial encounter: Secondary | ICD-10-CM | POA: Diagnosis not present

## 2017-12-26 DIAGNOSIS — J449 Chronic obstructive pulmonary disease, unspecified: Secondary | ICD-10-CM | POA: Diagnosis not present

## 2017-12-26 DIAGNOSIS — I130 Hypertensive heart and chronic kidney disease with heart failure and stage 1 through stage 4 chronic kidney disease, or unspecified chronic kidney disease: Secondary | ICD-10-CM | POA: Insufficient documentation

## 2017-12-26 DIAGNOSIS — I5033 Acute on chronic diastolic (congestive) heart failure: Secondary | ICD-10-CM | POA: Insufficient documentation

## 2017-12-26 DIAGNOSIS — F0390 Unspecified dementia without behavioral disturbance: Secondary | ICD-10-CM | POA: Diagnosis not present

## 2017-12-26 DIAGNOSIS — T148XXA Other injury of unspecified body region, initial encounter: Secondary | ICD-10-CM

## 2017-12-26 DIAGNOSIS — R600 Localized edema: Secondary | ICD-10-CM | POA: Diagnosis not present

## 2017-12-26 DIAGNOSIS — Y999 Unspecified external cause status: Secondary | ICD-10-CM | POA: Diagnosis not present

## 2017-12-26 DIAGNOSIS — R062 Wheezing: Secondary | ICD-10-CM | POA: Diagnosis not present

## 2017-12-26 DIAGNOSIS — R001 Bradycardia, unspecified: Secondary | ICD-10-CM | POA: Diagnosis not present

## 2017-12-26 DIAGNOSIS — S42292A Other displaced fracture of upper end of left humerus, initial encounter for closed fracture: Secondary | ICD-10-CM | POA: Diagnosis not present

## 2017-12-26 DIAGNOSIS — M25562 Pain in left knee: Secondary | ICD-10-CM | POA: Diagnosis not present

## 2017-12-26 DIAGNOSIS — S199XXA Unspecified injury of neck, initial encounter: Secondary | ICD-10-CM | POA: Diagnosis not present

## 2017-12-26 DIAGNOSIS — S4991XA Unspecified injury of right shoulder and upper arm, initial encounter: Secondary | ICD-10-CM | POA: Diagnosis not present

## 2017-12-26 DIAGNOSIS — S81012A Laceration without foreign body, left knee, initial encounter: Secondary | ICD-10-CM | POA: Diagnosis not present

## 2017-12-26 DIAGNOSIS — Z7401 Bed confinement status: Secondary | ICD-10-CM | POA: Diagnosis not present

## 2017-12-26 MED ORDER — ACETAMINOPHEN 500 MG PO TABS
1000.0000 mg | ORAL_TABLET | Freq: Once | ORAL | Status: DC
  Filled 2017-12-26: qty 2

## 2017-12-26 MED ORDER — FENTANYL CITRATE (PF) 100 MCG/2ML IJ SOLN
50.0000 ug | Freq: Once | INTRAMUSCULAR | Status: AC
  Administered 2017-12-26: 50 ug via INTRAMUSCULAR
  Filled 2017-12-26: qty 2

## 2017-12-26 NOTE — ED Triage Notes (Signed)
Pt states she lightly hit her head but denies LOC.

## 2017-12-26 NOTE — ED Notes (Signed)
Patient states she does not want her vitals taken.

## 2017-12-26 NOTE — ED Notes (Signed)
[facility] called @ [phone]-per Katie, RN-called by Levada Dy

## 2017-12-26 NOTE — ED Provider Notes (Signed)
[facility] DEPARTMENT Provider Note  CSN: [id number] Arrival date & time: 12/26/17 0248  Chief Complaint(s) Fall  HPI [name] is a 84 y.o. female with extensive past medical history listed below including mild dementia, [name] who presents to the emergency department after a mechanical fall as a result of tripping over a wheelchair.  She reports that she got up to go to the bathroom.  While walking back to her bed she turned off the lights and tripped over a wheelchair causing her to fall onto her left side.  She denies any head trauma or loss of consciousness.  She is mostly complaining of left leg and upper extremity pain.  Which is exacerbated with palpation and alleviated by mobility.  She also sustained skin tears.  Denies any neck pain, back pain, hip pain.  HPI  Past Medical History Past Medical History:  Diagnosis Date  . Acute blood loss anemia 04/20/2012  . AF (atrial fibrillation) ([place])     AV ablation 9/09 [place] per Dr Ola Spurr - AV node ablation 9/11 Dr Caryl Comes  . Amiodarone pulmonary toxicity   . Ascending aortic aneurysm ([place]) 12/11/2016  . Asthma   . Bronchitis    hx of   . [name] (coronary artery disease)    (not sure of this 11/10  . CHF (congestive heart failure) ([place])   . Chronic renal insufficiency, stage III (moderate) (HCC)    CrCl about 60 ml/min  . CKD (chronic kidney disease) stage 3, GFR 30-59 ml/min (HCC)   . Complication of anesthesia    "psycotic episode" after hip surg - resolved  . COPD (chronic obstructive pulmonary disease) (HCC)    emphysema -FeV1 73% DLCO 53% 5/09  . CVA (cerebral vascular accident) ([place])    no residual effects evident to pt   . Depression 06/06/2012  . Diabetes ([place])   . Diastolic heart failure    Acute on Chronic  . Eczema   . GERD (gastroesophageal reflux disease)   . History of skin cancer   . Hx of cardiovascular stress test    Lexiscan Myoview (09/2013):  No  definite ischemia, EF 67%; low risk  . Hyperlipidemia   . Invasive ductal carcinoma of breast ([place]) 2011   LEFT   . Mental disorder   . OA (osteoarthritis) of knee    RIGHT  . Pacemaker    Permanent  . PAD (peripheral artery disease) (HCC)    w/hx right iliac/SFA stenting and left and rt leg PTA  . Pain    pt states has pain in fingertips per right hand pt states has been told may be carpal tunnel  . Pneumonia    hx of   . Right sided sciatica   . Shortness of breath dyspnea    walking distances   . Sinoatrial node dysfunction (HCC)   . Sleep apnea    associated with hypersomnia uses O2 2L/M at night and during naps also uses CPAP  . Tobacco abuse   . Tremors of nervous system    hands bilat    Patient Active Problem List   Diagnosis Date Noted  . Aortic atherosclerosis ([place]) 06/07/2017  . Pressure injury of skin 04/28/2017  . COPD exacerbation ([place])   . Severe comorbid illness   . Palliative care encounter   . Cough 04/23/2017  . Fever 04/23/2017  . Influenza A 04/23/2017  . Influenza 04/23/2017  . CKD (chronic kidney disease) stage 3, GFR 30-59  ml/min ([place])   . Primary cancer of left upper lobe of lung ([place]) 04/12/2017  . Recurrent pneumonia 03/05/2017  . Right lower lobe lung mass 12/11/2016  . Ascending aortic aneurysm ([place]) 12/11/2016  . Agitation   . Acute on chronic respiratory failure with hypoxia ([place]) 11/28/2016  . HCAP (healthcare-associated pneumonia) 11/28/2016  . Acute renal failure superimposed on stage 3 chronic kidney disease ([place]) 11/28/2016  . Gait instability 06/22/2016  . Chronic respiratory failure ([place]) 05/15/2016  . Rotator cuff tear arthropathy of both shoulders 12/09/2015  . Complete heart block ([place]) 02/13/2015  . Acute blood loss anemia 01/28/2015  . Constipation 11/07/2014  . OA (osteoarthritis) of hip 10/31/2014  . Osteoporosis 01/10/2014  . Nicotine dependence in remission 06/28/2013  . GERD (gastroesophageal reflux disease)  07/19/2012  . Depression 06/06/2012  . Secondary hyperparathyroidism ([place]) 04/25/2012  . Gold stage C. COPD with frequent exacerbations 12/15/2011  . Diastolic CHF, chronic ([place]) 12/15/2011  . Goals of care, counseling/discussion 08/27/2011  . History of stroke 02/21/2010  . PACEMAKER, PERMANENT 02/21/2010  . Malignant neoplasm of upper-inner quadrant of left breast in female, estrogen receptor positive ([place]) 02/03/2010  . Allergic rhinitis 12/31/2009  . Carotid stenosis 08/17/2008  . Controlled diabetes mellitus type 2 with complications ([place]) 69/67/8938  . Obstructive sleep apnea 06/01/2008  . Atrial fibrillation ([place]) 06/01/2008  . [name] 06/01/2008  . Hyperlipidemia 12/30/2006  . [name] (coronary artery disease) 12/30/2006   Home Medication(s) Prior to Admission medications   Medication Sig Start Date End Date Taking? Authorizing Provider  acetaminophen (TYLENOL) 325 MG tablet Take 650 mg by mouth 4 (four) times daily.    Yes [provider]  benzonatate (TESSALON) 200 MG capsule Take 200 mg by mouth at bedtime.   Yes [provider]  benzonatate (TESSALON) 200 MG capsule Take 200 mg by mouth 2 (two) times daily as needed for cough.   Yes [provider]  ELIQUIS 5 MG TABS tablet TAKE 1 TABLET TWICE DAILY  (MUST  ESTABLISH  NEW  PRIMARY CARE PROVIDER  FOR FUTURE REFILLS) Patient taking differently: Take 5 mg by mouth 2 (two) times daily.  11/14/15  Yes Hoyt Koch, MD  escitalopram (LEXAPRO) 10 MG tablet Take 10 mg by mouth daily.  02/17/17  Yes [provider]  furosemide (LASIX) 40 MG tablet Take 1 tablet (40 mg total) by mouth 2 (two) times daily. 12/11/16  Yes Rama, Venetia Maxon, MD  glipiZIDE (GLUCOTROL) 5 MG tablet Take 5 mg by mouth daily before breakfast.   Yes [provider]  ipratropium (ATROVENT) 0.02 % nebulizer solution Take 0.5 mg by nebulization 3 (three) times daily.   Yes [provider]  levalbuterol (XOPENEX)  1.25 MG/3ML nebulizer solution Take 1.25 mg by nebulization every 8 (eight) hours as needed for wheezing.   Yes [provider]  loratadine (CLARITIN) 10 MG tablet Take 10 mg by mouth daily.   Yes [provider]  metFORMIN (GLUCOPHAGE XR) 500 MG 24 hr tablet Take 1 tablet (500 mg total) by mouth daily with breakfast. 03/26/17  Yes Hoyt Koch, MD  MUCINEX 600 MG 12 hr tablet TAKE ONE TABLET TWICE DAILY Patient taking differently: Take 600 mg by mouth 2 (two) times daily.  09/21/16  Yes Nche, Charlene Brooke, NP  nystatin [facility]) powder Apply topically 2 (two) times daily as needed (beneath bilateral breasts).   Yes [provider]  polyethylene glycol (MIRALAX / GLYCOLAX) packet Take 17 g by mouth 2 (  two) times daily. Patient taking differently: Take 17 g by mouth daily as needed for mild constipation.  12/11/16  Yes Rama, Venetia Maxon, MD  potassium chloride SA (K-DUR,KLOR-CON) 20 MEQ tablet Take 1 tablet (20 mEq total) by mouth 2 (two) times daily. 12/11/16  Yes Rama, Venetia Maxon, MD  predniSONE (DELTASONE) 10 MG tablet Take 1 tablet (10 mg total) by mouth daily with breakfast. 05/03/17  Yes Domenic Polite, MD  QUEtiapine (SEROQUEL) 25 MG tablet Take 12.5 mg by mouth 2 (two) times daily.   Yes [provider]  senna-docusate (SENOKOT-S) 8.6-50 MG tablet Take 1 tablet by mouth 2 (two) times daily. 12/11/16  Yes Rama, Venetia Maxon, MD  atorvastatin (LIPITOR) 20 MG tablet TAKE 1 TABLET EVERY DAY Patient not taking: Reported on 12/26/2017 03/10/16   Deboraha Sprang, MD  B-D ULTRA-FINE 33 LANCETS MISC Use to help check blood sugars daily Dx E11.9 07/30/15   Hoyt Koch, MD  glucose blood (BAYER CONTOUR TEST) test strip 1 each by Other route daily. Use to check blood sugars every day Dx E11.9 07/18/15   Hoyt Koch, MD  memantine (NAMENDA XR) 28 MG CP24 24 hr capsule Take 1 capsule (28 mg total) by mouth daily. Patient not taking: Reported on  12/26/2017 03/26/17   Hoyt Koch, MD  OXYGEN Inhale 2 L into the lungs.    [provider]  predniSONE (DELTASONE) 20 MG tablet Take 1-2 tablets (20-40 mg total) by mouth daily with breakfast. Take 40mg  daily for 2days then 20mg  daily for 2days then 10mg  daily thereafter Patient not taking: Reported on 12/26/2017 04/30/17   Domenic Polite, MD  Respiratory Therapy Supplies (FLUTTER) DEVI Use as directed 03/24/17   Marshell Garfinkel, MD  SPIRIVA HANDIHALER 18 MCG inhalation capsule INHALE THE CONTENTS OF 1 CAPSULE EVERY DAY Patient not taking: Reported on 12/26/2017 08/12/16   Marshell Garfinkel, MD                                                                                                                                    Past Surgical History Past Surgical History:  Procedure Laterality Date  . ATRIAL ABLATION SURGERY     x 2 - "did not help - had to have pacemaker"  . BREAST LUMPECTOMY     left breast  . CARDIAC CATHETERIZATION    . CATARACT EXTRACTION, BILATERAL     with IOL/Dr Katy Fitch  . EP IMPLANTABLE DEVICE N/A 02/13/2015   Procedure: PPM Generator Changeout-[employer] device;  Surgeon: Deboraha Sprang, MD;  Location: [facility];  Service: Cardiovascular;  Laterality: N/A;  . MASTECTOMY, PARTIAL  02/03/2010   Left/Dr Rosenbower  . ORIF ACETABULAR FRACTURE Left 04/19/2012   Procedure: OPEN REDUCTION INTERNAL FIXATION (ORIF) ACETABULAR FRACTURE;  Surgeon: Rozanna Box, MD;  Location: [facility];  Service: Orthopedics;  Laterality: Left;  . sun spot removed  forhead  . TOTAL HIP ARTHROPLASTY Left 10/31/2014   Procedure: LEFT TOTAL HIP ARTHROPLASTY POSTERIOR  APPROACH;  Surgeon: Gaynelle Arabian, MD;  Location: [facility];  Service: Orthopedics;  Laterality: Left;  . TOTAL KNEE ARTHROPLASTY Right 10/16/2013   Procedure: RIGHT TOTAL KNEE ARTHROPLASTY;  Surgeon: Gearlean Alf, MD;  Location: [facility];  Service: Orthopedics;  Laterality: Right;  Marland Kitchen VASCULAR SURGERY     both legs     Family History Family History  Problem Relation Age of Onset  . Heart disease Father     Social History Social History   Tobacco Use  . Smoking status: Former Smoker    Packs/day: 1.00    Years: 55.00    Pack years: 55.00    Types: Cigarettes    Last attempt to quit: 12/19/2011    Years since quitting: 6.0  . Smokeless tobacco: Never Used  . Tobacco comment: started smoking at age 35--4 cigs per day.  smoked 2ppd the last 10 yrs  Substance Use Topics  . Alcohol use: Yes    Comment: wine (rare)  . Drug use: No   Allergies Cholestatin and Sulfur  Review of Systems Review of Systems All other systems are reviewed and are negative for acute change except as noted in the HPI  Physical Exam Vital Signs  I have reviewed the triage vital signs Pulse 76 Comment: Pt refusing BP  Temp 98.3 F (36.8 C) (Oral)   Resp 18 Comment: Pt refusing BP  Ht 5\' 7"  (1.702 m)   Wt 81.6 kg   SpO2 97% Comment: Pt refusing BP  BMI 28.19 kg/m   Physical Exam  Constitutional: She appears well-developed and well-nourished. No distress.  HENT:  Head: Normocephalic and atraumatic.  Right Ear: External ear normal.  Left Ear: External ear normal.  Nose: Nose normal.  Eyes: Pupils are equal, round, and reactive to light. Conjunctivae and EOM are normal. Right eye exhibits no discharge. Left eye exhibits no discharge. No scleral icterus.  Neck: Normal range of motion. Neck supple.  Cardiovascular: Normal rate, regular rhythm and normal heart sounds. Exam reveals no gallop and no friction rub.  No murmur heard. Pulses:      Radial pulses are 2+ on the right side, and 2+ on the left side.       Dorsalis pedis pulses are 2+ on the right side, and 2+ on the left side.  Pulmonary/Chest: Effort normal and breath sounds normal. No stridor. No respiratory distress. She has no wheezes.  Abdominal: Soft. She exhibits no distension. There is no tenderness.  Musculoskeletal: She exhibits no edema.        Right hip: She exhibits no tenderness.       Left hip: She exhibits no tenderness.       Left knee: She exhibits no deformity. Tenderness found.       Left ankle: No tenderness.       Cervical back: She exhibits no bony tenderness.       Thoracic back: She exhibits no bony tenderness.       Lumbar back: She exhibits no bony tenderness.       Right upper arm: She exhibits tenderness.       Left forearm: She exhibits tenderness.       Left foot: There is no tenderness.  Clavicles stable. Chest stable to AP/Lat compression. Pelvis stable to Lat compression. No obvious extremity deformity. No chest or abdominal wall contusion.  Neurological: She is alert. She is  disoriented (oriented to person and place).  Moving all extremities  Skin: Skin is warm and dry. No rash noted. She is not diaphoretic. No erythema.     Psychiatric: She has a normal mood and affect.    ED Results and Treatments Labs (all labs ordered are listed, but only abnormal results are displayed) Labs Reviewed - No data to display                                                                                                                       EKG  EKG Interpretation  Date/Time:    Ventricular Rate:    PR Interval:    QRS Duration:   QT Interval:    QTC Calculation:   R Axis:     Text Interpretation:        Radiology Dg Forearm Left  Result Date: 12/26/2017 CLINICAL DATA:  Trip over wheelchair leading to fall. Left forearm pain. EXAM: LEFT FOREARM - 2 VIEW COMPARISON:  None. FINDINGS: There is no evidence of fracture or other focal bone lesions. Bones are under mineralized. Osteoarthritis at the base of the thumb. A dressing overlies the proximal forearm. A 4 mm density about the distal aspect of the dressing may be external, soft tissue foreign body, or incidental soft tissue calcifications. IMPRESSION: 1. No fracture of the left forearm. 2. Dressing overlies the proximal forearm. 4 mm density about the  distal aspect of the dressing may be external to the patient, soft tissue foreign body or incidental soft tissue calcification. Electronically Signed   By: Keith Rake M.D.   On: 12/26/2017 06:25   Dg Tibia/fibula Left  Result Date: 12/26/2017 CLINICAL DATA:  Trip over wheelchair leading to fall. Left lower leg pain. EXAM: LEFT TIBIA AND FIBULA - 2 VIEW COMPARISON:  Left tibia/fibular radiographs 08/21/2017 FINDINGS: Cortical margins of the tibia and fibula are intact. No acute fracture. Ankle alignment is maintained. Mild soft tissue edema. IMPRESSION: Mild soft tissue edema without acute osseous abnormality. Electronically Signed   By: Keith Rake M.D.   On: 12/26/2017 06:30   Ct Head Wo Contrast  Result Date: 12/26/2017 CLINICAL DATA:  Patient fell over wheelchair this morning. Head trauma. EXAM: CT HEAD WITHOUT CONTRAST CT CERVICAL SPINE WITHOUT CONTRAST TECHNIQUE: Multidetector CT imaging of the head and cervical spine was performed following the standard protocol without intravenous contrast. Multiplanar CT image reconstructions of the cervical spine were also generated. COMPARISON:  02/21/2017 FINDINGS: CT HEAD FINDINGS Brain: Atrophy with chronic microvascular ischemia. No intra-axial mass nor extra-axial fluid. No large vascular territory infarction, midline shift or edema. Midline fourth ventricle basal cisterns. Unremarkable cerebellum brainstem. Vascular: No hyperdense vessel sign. Moderate atherosclerosis of the carotid siphons. Skull: No fracture or suspicious osseous lesions. Sinuses/Orbits: Bilateral lens replacements. Other: Clear bilateral mastoids. CT CERVICAL SPINE FINDINGS Alignment: Mild dextroconvex curvature of the cervical spine. Maintained cervical lordosis. Skull base and vertebrae: Developmental incomplete posterior arch of C1. Intact skull base. No acute cervical spine fracture.  Soft tissues and spinal canal: No prevertebral fluid or swelling. No visible canal  hematoma. Disc levels: Marked degenerative disc flattening C4 through C7 and at T1-T2. Uncovertebral joint osteoarthritis with uncinate spurring is seen at C3-4, C4-5, C5-6 and C6-7 bilaterally. Mild right-sided C4-5 and C5-6 foraminal encroachment results. Multilevel degenerative facet arthropathy is noted. Upper chest: Scarring at the apices. Other: Extracranial carotid arteriosclerosis. IMPRESSION: 1. Atrophy with chronic microvascular ischemia. No acute intracranial abnormality. 2. Spondylitic change of the cervical spine without acute cervical spine fracture or static listhesis. Electronically Signed   By: Ashley Royalty M.D.   On: 12/26/2017 03:40   Ct Cervical Spine Wo Contrast  Result Date: 12/26/2017 CLINICAL DATA:  Patient fell over wheelchair this morning. Head trauma. EXAM: CT HEAD WITHOUT CONTRAST CT CERVICAL SPINE WITHOUT CONTRAST TECHNIQUE: Multidetector CT imaging of the head and cervical spine was performed following the standard protocol without intravenous contrast. Multiplanar CT image reconstructions of the cervical spine were also generated. COMPARISON:  02/21/2017 FINDINGS: CT HEAD FINDINGS Brain: Atrophy with chronic microvascular ischemia. No intra-axial mass nor extra-axial fluid. No large vascular territory infarction, midline shift or edema. Midline fourth ventricle basal cisterns. Unremarkable cerebellum brainstem. Vascular: No hyperdense vessel sign. Moderate atherosclerosis of the carotid siphons. Skull: No fracture or suspicious osseous lesions. Sinuses/Orbits: Bilateral lens replacements. Other: Clear bilateral mastoids. CT CERVICAL SPINE FINDINGS Alignment: Mild dextroconvex curvature of the cervical spine. Maintained cervical lordosis. Skull base and vertebrae: Developmental incomplete posterior arch of C1. Intact skull base. No acute cervical spine fracture. Soft tissues and spinal canal: No prevertebral fluid or swelling. No visible canal hematoma. Disc levels: Marked  degenerative disc flattening C4 through C7 and at T1-T2. Uncovertebral joint osteoarthritis with uncinate spurring is seen at C3-4, C4-5, C5-6 and C6-7 bilaterally. Mild right-sided C4-5 and C5-6 foraminal encroachment results. Multilevel degenerative facet arthropathy is noted. Upper chest: Scarring at the apices. Other: Extracranial carotid arteriosclerosis. IMPRESSION: 1. Atrophy with chronic microvascular ischemia. No acute intracranial abnormality. 2. Spondylitic change of the cervical spine without acute cervical spine fracture or static listhesis. Electronically Signed   By: Ashley Royalty M.D.   On: 12/26/2017 03:40   Dg Knee Complete 4 Views Left  Result Date: 12/26/2017 CLINICAL DATA:  Trip over wheelchair leading to fall. Left knee pain. EXAM: LEFT KNEE - COMPLETE 4+ VIEW COMPARISON:  Left knee radiograph 01/30/2013 FINDINGS: No evidence of fracture, dislocation, or joint effusion. Medial and lateral chondrocalcinosis. Mild medial and lateral tibiofemoral joint space narrowing. Osteoarthritis of the patellofemoral joint. Soft tissue edema about the infrapatellar region. IMPRESSION: Osteoarthritis without fracture or dislocation. Electronically Signed   By: Keith Rake M.D.   On: 12/26/2017 06:29   Dg Humerus Left  Result Date: 12/26/2017 CLINICAL DATA:  Trip over wheelchair leading to fall. Left humerus pain. EXAM: LEFT HUMERUS - 2+ VIEW COMPARISON:  Shoulder radiograph 07/26/2015., humeral radiographs 04/14/2012 FINDINGS: No acute fracture of the humerus. Chronic deformity of the humeral head is sequela of remote prior injury. Glenohumeral osteoarthritis not well assessed on concurrent views. Elbow alignment is maintained. No focal soft tissue abnormality. IMPRESSION: Sequela of prior proximal humerus fracture. No acute fracture of the humerus. Electronically Signed   By: Keith Rake M.D.   On: 12/26/2017 06:27   Dg Humerus Right  Result Date: 12/26/2017 CLINICAL DATA:  Trip over  wheelchair leading to fall. Right humerus pain. EXAM: RIGHT HUMERUS - 2+ VIEW COMPARISON:  None. FINDINGS: There is no evidence of fracture or other focal bone lesions.  Shoulder and elbow alignment are maintained. Soft tissues are unremarkable. IMPRESSION: No fracture of the right humerus. Electronically Signed   By: Keith Rake M.D.   On: 12/26/2017 06:27   Pertinent labs & imaging results that were available during my care of the patient were reviewed by me and considered in my medical decision making (see chart for details).  Medications Ordered in ED Medications  acetaminophen (TYLENOL) tablet 1,000 mg (has no administration in time range)  fentaNYL (SUBLIMAZE) injection 50 mcg (50 mcg Intramuscular Given 12/26/17 0538)                                                                                                                                    Procedures Procedures  (including critical care time)  Medical Decision Making / ED Course I have reviewed the nursing notes for this encounter and the patient's prior records (if available in EHR or on provided paperwork).    Mechanical fall resulting in multiple skin tears.  Patient also with tenderness to palpation to right upper arm, left forearm.  Left knee.  CT of the head and cervical spine negative.  Plain films of the above extremities negative for any acute fracture dislocation.  Wounds were cleaned and bandaged.  The patient appears reasonably screened and/or stabilized for discharge and I doubt any other medical condition or other [facility] requiring further screening, evaluation, or treatment in the ED at this time prior to discharge.  The patient is safe for discharge with strict return precautions.   Final Clinical Impression(s) / ED Diagnoses Final diagnoses:  Fall  Multiple skin tears   Disposition: Discharge  Condition: Good .    ED Discharge Orders    None       This chart was dictated using voice  recognition software.  Despite best efforts to proofread,  errors can occur which can change the documentation meaning.   Fatima Blank, MD 12/26/17 [phone]

## 2017-12-26 NOTE — ED Notes (Signed)
Patient transported to CT 

## 2017-12-26 NOTE — ED Notes (Signed)
Patient transported to X-ray 

## 2017-12-26 NOTE — ED Triage Notes (Signed)
BIB [facility] from [facility] @ [place]. Pt fell over wheel chair this am. Multiple skin tears to left side arm and leg. Bleeding controled. Pt on Eliquis. A&O X 4 on arrival. Hx of dementia and CHF. Received 5mg  Albuterol in route.  Pt refuses to have blood pressure taken.

## 2017-12-26 NOTE — ED Notes (Addendum)
[facility] at bedside to transport patient. This RN spoke with patients daughter in law Dr. Radford Pax and informed of patient condition and returning to facility.

## 2017-12-27 DIAGNOSIS — I4891 Unspecified atrial fibrillation: Secondary | ICD-10-CM | POA: Diagnosis not present

## 2017-12-27 DIAGNOSIS — I509 Heart failure, unspecified: Secondary | ICD-10-CM | POA: Diagnosis not present

## 2017-12-27 DIAGNOSIS — T148XXA Other injury of unspecified body region, initial encounter: Secondary | ICD-10-CM | POA: Diagnosis not present

## 2017-12-27 DIAGNOSIS — J449 Chronic obstructive pulmonary disease, unspecified: Secondary | ICD-10-CM | POA: Diagnosis not present

## 2018-01-07 DIAGNOSIS — I5030 Unspecified diastolic (congestive) heart failure: Secondary | ICD-10-CM | POA: Diagnosis not present

## 2018-01-07 DIAGNOSIS — N183 Chronic kidney disease, stage 3 (moderate): Secondary | ICD-10-CM | POA: Diagnosis not present

## 2018-01-07 DIAGNOSIS — J449 Chronic obstructive pulmonary disease, unspecified: Secondary | ICD-10-CM | POA: Diagnosis not present

## 2018-01-07 DIAGNOSIS — I495 Sick sinus syndrome: Secondary | ICD-10-CM | POA: Diagnosis not present

## 2018-01-07 DIAGNOSIS — L8990 Pressure ulcer of unspecified site, unspecified stage: Secondary | ICD-10-CM | POA: Diagnosis not present

## 2018-01-07 DIAGNOSIS — E1159 Type 2 diabetes mellitus with other circulatory complications: Secondary | ICD-10-CM | POA: Diagnosis not present

## 2018-01-07 DIAGNOSIS — I25810 Atherosclerosis of coronary artery bypass graft(s) without angina pectoris: Secondary | ICD-10-CM | POA: Diagnosis not present

## 2018-01-07 DIAGNOSIS — I70209 Unspecified atherosclerosis of native arteries of extremities, unspecified extremity: Secondary | ICD-10-CM | POA: Diagnosis not present

## 2018-01-07 DIAGNOSIS — Z853 Personal history of malignant neoplasm of breast: Secondary | ICD-10-CM | POA: Diagnosis not present

## 2018-01-07 DIAGNOSIS — R1310 Dysphagia, unspecified: Secondary | ICD-10-CM | POA: Diagnosis not present

## 2018-01-07 DIAGNOSIS — I679 Cerebrovascular disease, unspecified: Secondary | ICD-10-CM | POA: Diagnosis not present

## 2018-01-07 DIAGNOSIS — C3490 Malignant neoplasm of unspecified part of unspecified bronchus or lung: Secondary | ICD-10-CM | POA: Diagnosis not present

## 2018-01-07 DIAGNOSIS — J690 Pneumonitis due to inhalation of food and vomit: Secondary | ICD-10-CM | POA: Diagnosis not present

## 2018-01-07 DIAGNOSIS — I4891 Unspecified atrial fibrillation: Secondary | ICD-10-CM | POA: Diagnosis not present

## 2018-01-07 DIAGNOSIS — R0902 Hypoxemia: Secondary | ICD-10-CM | POA: Diagnosis not present

## 2018-01-07 DIAGNOSIS — F0151 Vascular dementia with behavioral disturbance: Secondary | ICD-10-CM | POA: Diagnosis not present

## 2018-01-08 DIAGNOSIS — F411 Generalized anxiety disorder: Secondary | ICD-10-CM | POA: Diagnosis not present

## 2018-01-08 DIAGNOSIS — F331 Major depressive disorder, recurrent, moderate: Secondary | ICD-10-CM | POA: Diagnosis not present

## 2018-01-10 DIAGNOSIS — J449 Chronic obstructive pulmonary disease, unspecified: Secondary | ICD-10-CM | POA: Diagnosis not present

## 2018-01-10 DIAGNOSIS — C3490 Malignant neoplasm of unspecified part of unspecified bronchus or lung: Secondary | ICD-10-CM | POA: Diagnosis not present

## 2018-01-10 DIAGNOSIS — I495 Sick sinus syndrome: Secondary | ICD-10-CM | POA: Diagnosis not present

## 2018-01-10 DIAGNOSIS — F0151 Vascular dementia with behavioral disturbance: Secondary | ICD-10-CM | POA: Diagnosis not present

## 2018-01-10 DIAGNOSIS — I5030 Unspecified diastolic (congestive) heart failure: Secondary | ICD-10-CM | POA: Diagnosis not present

## 2018-01-10 DIAGNOSIS — I4891 Unspecified atrial fibrillation: Secondary | ICD-10-CM | POA: Diagnosis not present

## 2018-01-11 DIAGNOSIS — F0150 Vascular dementia without behavioral disturbance: Secondary | ICD-10-CM | POA: Diagnosis not present

## 2018-01-12 DIAGNOSIS — F0151 Vascular dementia with behavioral disturbance: Secondary | ICD-10-CM | POA: Diagnosis not present

## 2018-01-12 DIAGNOSIS — I495 Sick sinus syndrome: Secondary | ICD-10-CM | POA: Diagnosis not present

## 2018-01-12 DIAGNOSIS — I5030 Unspecified diastolic (congestive) heart failure: Secondary | ICD-10-CM | POA: Diagnosis not present

## 2018-01-12 DIAGNOSIS — I4891 Unspecified atrial fibrillation: Secondary | ICD-10-CM | POA: Diagnosis not present

## 2018-01-12 DIAGNOSIS — C3490 Malignant neoplasm of unspecified part of unspecified bronchus or lung: Secondary | ICD-10-CM | POA: Diagnosis not present

## 2018-01-12 DIAGNOSIS — J449 Chronic obstructive pulmonary disease, unspecified: Secondary | ICD-10-CM | POA: Diagnosis not present

## 2018-01-14 DIAGNOSIS — J449 Chronic obstructive pulmonary disease, unspecified: Secondary | ICD-10-CM | POA: Diagnosis not present

## 2018-01-14 DIAGNOSIS — I5030 Unspecified diastolic (congestive) heart failure: Secondary | ICD-10-CM | POA: Diagnosis not present

## 2018-01-14 DIAGNOSIS — C3490 Malignant neoplasm of unspecified part of unspecified bronchus or lung: Secondary | ICD-10-CM | POA: Diagnosis not present

## 2018-01-14 DIAGNOSIS — I495 Sick sinus syndrome: Secondary | ICD-10-CM | POA: Diagnosis not present

## 2018-01-14 DIAGNOSIS — F0151 Vascular dementia with behavioral disturbance: Secondary | ICD-10-CM | POA: Diagnosis not present

## 2018-01-14 DIAGNOSIS — I4891 Unspecified atrial fibrillation: Secondary | ICD-10-CM | POA: Diagnosis not present

## 2018-01-17 DIAGNOSIS — I495 Sick sinus syndrome: Secondary | ICD-10-CM | POA: Diagnosis not present

## 2018-01-17 DIAGNOSIS — F0151 Vascular dementia with behavioral disturbance: Secondary | ICD-10-CM | POA: Diagnosis not present

## 2018-01-17 DIAGNOSIS — I4891 Unspecified atrial fibrillation: Secondary | ICD-10-CM | POA: Diagnosis not present

## 2018-01-17 DIAGNOSIS — J449 Chronic obstructive pulmonary disease, unspecified: Secondary | ICD-10-CM | POA: Diagnosis not present

## 2018-01-17 DIAGNOSIS — C3490 Malignant neoplasm of unspecified part of unspecified bronchus or lung: Secondary | ICD-10-CM | POA: Diagnosis not present

## 2018-01-17 DIAGNOSIS — I5030 Unspecified diastolic (congestive) heart failure: Secondary | ICD-10-CM | POA: Diagnosis not present

## 2018-01-19 DIAGNOSIS — I5030 Unspecified diastolic (congestive) heart failure: Secondary | ICD-10-CM | POA: Diagnosis not present

## 2018-01-19 DIAGNOSIS — J449 Chronic obstructive pulmonary disease, unspecified: Secondary | ICD-10-CM | POA: Diagnosis not present

## 2018-01-19 DIAGNOSIS — I495 Sick sinus syndrome: Secondary | ICD-10-CM | POA: Diagnosis not present

## 2018-01-19 DIAGNOSIS — I4891 Unspecified atrial fibrillation: Secondary | ICD-10-CM | POA: Diagnosis not present

## 2018-01-19 DIAGNOSIS — F0151 Vascular dementia with behavioral disturbance: Secondary | ICD-10-CM | POA: Diagnosis not present

## 2018-01-19 DIAGNOSIS — C3490 Malignant neoplasm of unspecified part of unspecified bronchus or lung: Secondary | ICD-10-CM | POA: Diagnosis not present

## 2018-01-21 DIAGNOSIS — C3490 Malignant neoplasm of unspecified part of unspecified bronchus or lung: Secondary | ICD-10-CM | POA: Diagnosis not present

## 2018-01-21 DIAGNOSIS — F0151 Vascular dementia with behavioral disturbance: Secondary | ICD-10-CM | POA: Diagnosis not present

## 2018-01-21 DIAGNOSIS — I5030 Unspecified diastolic (congestive) heart failure: Secondary | ICD-10-CM | POA: Diagnosis not present

## 2018-01-21 DIAGNOSIS — I495 Sick sinus syndrome: Secondary | ICD-10-CM | POA: Diagnosis not present

## 2018-01-21 DIAGNOSIS — J449 Chronic obstructive pulmonary disease, unspecified: Secondary | ICD-10-CM | POA: Diagnosis not present

## 2018-01-21 DIAGNOSIS — I4891 Unspecified atrial fibrillation: Secondary | ICD-10-CM | POA: Diagnosis not present

## 2018-01-24 ENCOUNTER — Ambulatory Visit (INDEPENDENT_AMBULATORY_CARE_PROVIDER_SITE_OTHER): Admitting: Internal Medicine

## 2018-01-24 ENCOUNTER — Encounter: Payer: Self-pay | Admitting: Internal Medicine

## 2018-01-24 VITALS — HR 74 | Ht 67.0 in | Wt 196.4 lb

## 2018-01-24 DIAGNOSIS — F0151 Vascular dementia with behavioral disturbance: Secondary | ICD-10-CM | POA: Diagnosis not present

## 2018-01-24 DIAGNOSIS — I442 Atrioventricular block, complete: Secondary | ICD-10-CM

## 2018-01-24 DIAGNOSIS — R001 Bradycardia, unspecified: Secondary | ICD-10-CM

## 2018-01-24 DIAGNOSIS — Z950 Presence of cardiac pacemaker: Secondary | ICD-10-CM

## 2018-01-24 DIAGNOSIS — C3490 Malignant neoplasm of unspecified part of unspecified bronchus or lung: Secondary | ICD-10-CM | POA: Diagnosis not present

## 2018-01-24 DIAGNOSIS — I5030 Unspecified diastolic (congestive) heart failure: Secondary | ICD-10-CM | POA: Diagnosis not present

## 2018-01-24 DIAGNOSIS — I4819 Other persistent atrial fibrillation: Secondary | ICD-10-CM | POA: Diagnosis not present

## 2018-01-24 DIAGNOSIS — J449 Chronic obstructive pulmonary disease, unspecified: Secondary | ICD-10-CM | POA: Diagnosis not present

## 2018-01-24 DIAGNOSIS — I495 Sick sinus syndrome: Secondary | ICD-10-CM | POA: Diagnosis not present

## 2018-01-24 DIAGNOSIS — I4891 Unspecified atrial fibrillation: Secondary | ICD-10-CM | POA: Diagnosis not present

## 2018-01-24 MED ORDER — APIXABAN 5 MG PO TABS: 5.0000 mg | ORAL_TABLET | Freq: Two times a day (BID) | ORAL | 3 refills | Status: AC

## 2018-01-24 NOTE — Progress Notes (Signed)
Patient Care Team: Jacklyn Shell, FNP as PCP - General (Nurse Practitioner) Clent Jacks, MD (Ophthalmology) Deboraha Sprang, MD (Cardiology) Jackolyn Confer, MD (General Surgery) Magrinat, Virgie Dad, MD (Hematology and Oncology) Deterding, Jeneen Rinks, MD (Nephrology) Particia Nearing, MD (Dermatology) Laurence Spates, MD (Gastroenterology) Almedia Balls, MD (Orthopedic Surgery) Marshell Garfinkel, MD as Consulting Physician (Pulmonary Disease)   HPI  [name] is a 84 y.o. female Seen in followup for a pacemaker implanted for bradycardia. She antecedent history of atrial fibrillation for which she underwent pulmonary vein ablation first at [facility]. Recurrent atrial arrhythmias more frequent symptomatic and she underwent junction ablation with significant interval improvement.   She underwent junction generator replacement 2016  Date Cr K Hgb  4/19 1.17 4.0 12.7         She is now living at [facility].  She is oxygen dependent from her COPD.  She is on prednisone.  She is also been identified as having a primary pulmonary neoplasm and was seen by Dr. Jana Hakim last in April.  Repeat CT scanning was suggested for July but never consummated.  She tells me that her family does not want her to see doctors.  Denies chest pain.  No edema.  Chronic shortness of breath.  TERF stroke-2 age-38 gender-1 HTN -1 DM-1 for a CHADS-VASc score of greater than or equal to 6   Echo normal LV function 7/15; Myoview scan 7/15 normal LV function and no ischemia matched defects question artifact  Past Medical History:  Diagnosis Date  . Acute blood loss anemia 04/20/2012  . AF (atrial fibrillation) ([place])     AV ablation 9/09 [place] per Dr Ola Spurr - AV node ablation 9/11 Dr Caryl Comes  . Amiodarone pulmonary toxicity   . Ascending aortic aneurysm ([place]) 12/11/2016  . Asthma   . Bronchitis    hx of   . [name] (coronary artery disease)    (not sure of this 11/10  . CHF (congestive heart  failure) ([place])   . Chronic renal insufficiency, stage III (moderate) (HCC)    CrCl about 60 ml/min  . CKD (chronic kidney disease) stage 3, GFR 30-59 ml/min (HCC)   . Complication of anesthesia    "psycotic episode" after hip surg - resolved  . COPD (chronic obstructive pulmonary disease) (HCC)    emphysema -FeV1 73% DLCO 53% 5/09  . CVA (cerebral vascular accident) ([place])    no residual effects evident to pt   . Depression 06/06/2012  . Diabetes ([place])   . Diastolic heart failure    Acute on Chronic  . Eczema   . GERD (gastroesophageal reflux disease)   . History of skin cancer   . Hx of cardiovascular stress test    Lexiscan Myoview (09/2013):  No definite ischemia, EF 67%; low risk  . Hyperlipidemia   . Invasive ductal carcinoma of breast ([place]) 2011   LEFT   . Mental disorder   . OA (osteoarthritis) of knee    RIGHT  . Pacemaker    Permanent  . PAD (peripheral artery disease) (HCC)    w/hx right iliac/SFA stenting and left and rt leg PTA  . Pain    pt states has pain in fingertips per right hand pt states has been told may be carpal tunnel  . Pneumonia    hx of   . Right sided sciatica   . Shortness of breath dyspnea    walking distances   . Sinoatrial node dysfunction (HCC)   .  Sleep apnea    associated with hypersomnia uses O2 2L/M at night and during naps also uses CPAP  . Tobacco abuse   . Tremors of nervous system    hands bilat     Past Surgical History:  Procedure Laterality Date  . ATRIAL ABLATION SURGERY     x 2 - "did not help - had to have pacemaker"  . BREAST LUMPECTOMY     left breast  . CARDIAC CATHETERIZATION    . CATARACT EXTRACTION, BILATERAL     with IOL/Dr Katy Fitch  . EP IMPLANTABLE DEVICE N/A 02/13/2015   Procedure: PPM Generator Changeout-St. Jude device;  Surgeon: Deboraha Sprang, MD;  Location: [facility];  Service: Cardiovascular;  Laterality: N/A;  . MASTECTOMY, PARTIAL  02/03/2010   Left/Dr Rosenbower  . ORIF ACETABULAR FRACTURE  Left 04/19/2012   Procedure: OPEN REDUCTION INTERNAL FIXATION (ORIF) ACETABULAR FRACTURE;  Surgeon: Rozanna Box, MD;  Location: [facility];  Service: Orthopedics;  Laterality: Left;  . sun spot removed      forhead  . TOTAL HIP ARTHROPLASTY Left 10/31/2014   Procedure: LEFT TOTAL HIP ARTHROPLASTY POSTERIOR  APPROACH;  Surgeon: Gaynelle Arabian, MD;  Location: [facility];  Service: Orthopedics;  Laterality: Left;  . TOTAL KNEE ARTHROPLASTY Right 10/16/2013   Procedure: RIGHT TOTAL KNEE ARTHROPLASTY;  Surgeon: Gearlean Alf, MD;  Location: [facility];  Service: Orthopedics;  Laterality: Right;  Marland Kitchen VASCULAR SURGERY     both legs     Current Outpatient Medications  Medication Sig Dispense Refill  . acetaminophen (TYLENOL) 325 MG tablet Take 650 mg by mouth 4 (four) times daily.     . B-D ULTRA-FINE 33 LANCETS MISC Use to help check blood sugars daily Dx E11.9 100 each 2  . benzonatate (TESSALON) 200 MG capsule Take 200 mg by mouth at bedtime.    Marland Kitchen ELIQUIS 5 MG TABS tablet TAKE 1 TABLET TWICE DAILY  (MUST  ESTABLISH  NEW  PRIMARY CARE PROVIDER  FOR FUTURE REFILLS) 180 tablet 3  . escitalopram (LEXAPRO) 10 MG tablet Take 10 mg by mouth daily.     . furosemide (LASIX) 40 MG tablet Take 1 tablet (40 mg total) by mouth 2 (two) times daily. 30 tablet   . glipiZIDE (GLUCOTROL) 5 MG tablet Take 5 mg by mouth daily before breakfast.    . glucose blood (BAYER CONTOUR TEST) test strip 1 each by Other route daily. Use to check blood sugars every day Dx E11.9 90 each 3  . ipratropium (ATROVENT) 0.02 % nebulizer solution Take 0.5 mg by nebulization 3 (three) times daily.    Marland Kitchen loratadine (CLARITIN) 10 MG tablet Take 10 mg by mouth daily.    . metFORMIN (GLUCOPHAGE XR) 500 MG 24 hr tablet Take 1 tablet (500 mg total) by mouth daily with breakfast.    . MUCINEX 600 MG 12 hr tablet TAKE ONE TABLET TWICE DAILY 60 tablet 0  . nystatin (NYAMYC) powder Apply topically 2 (two) times daily as needed (beneath bilateral breasts).     . OXYGEN Inhale 2 L into the lungs.    . potassium chloride SA (K-DUR,KLOR-CON) 20 MEQ tablet Take 1 tablet (20 mEq total) by mouth 2 (two) times daily.    . predniSONE (DELTASONE) 10 MG tablet Take 1 tablet (10 mg total) by mouth daily with breakfast. 30 tablet 0  . QUEtiapine (SEROQUEL) 25 MG tablet Take 12.5 mg by mouth 2 (two) times daily.    Marland Kitchen senna-docusate (SENOKOT-S)  8.6-50 MG tablet Take 1 tablet by mouth 2 (two) times daily.     No current facility-administered medications for this visit.     Allergies  Allergen Reactions  . Cholestatin Other (See Comments)    RAGWEED SEASON...sneezing   . Sulfur Itching    Review of Systems negative except from HPI and PMH  Physical Exam Pulse 74   Ht 5\' 7"  (1.702 m)   Wt 196 lb 6.4 oz (89.1 kg)   SpO2 91%   BMI 30.76 kg/m   Well developed and nourished in moderate respiratory distress wearing oxygen.   HENT normal with cherubic facies Neck supple with JVP-flat Clear Regular rate and rhythm, no murmurs or gallops Abd-soft with active BS No Clubbing cyanosis edema Skin-warm and dry A & Oriented  Grossly normal sensory and motor function   ECG atrial flutter/fibrillation with underlying complete heart block and ventricular pacing Intervals-/15/44   Assessment and  Plan  Atrial fibrillation-permanent  Sinus bradycardia  Complete heart block S./P. AV junction ablation  COPD-oxygen dependent  Lung cancer  Cardiomyopathy  Pacemaker-St. Jude The patient's device was interrogated.  The information was reviewed. No changes were made in the programming.     Her Eliquis dose has inadvertently been decreased from 5--2.5.  Rewritten prescription to resume at 5 mg twice daily appropriate for her weight and renal function. I will send a message to Dr. Jana Hakim to make sure that he is aware of the missed CT scan.   Needs CBC  We spent more than 50% of our >25 min visit in face to face counseling regarding the above

## 2018-01-24 NOTE — Patient Instructions (Signed)
Your physician recommends that you continue on your current medications as directed. Please refer to the Current Medication list given to you today.   Your physician wants you to follow-up in: [place] will receive a reminder letter in the mail two months in advance. If you don't receive a letter, please call our office to schedule the follow-up appointment.

## 2018-01-26 ENCOUNTER — Other Ambulatory Visit: Payer: Self-pay

## 2018-01-26 DIAGNOSIS — I4891 Unspecified atrial fibrillation: Secondary | ICD-10-CM | POA: Diagnosis not present

## 2018-01-26 DIAGNOSIS — C3490 Malignant neoplasm of unspecified part of unspecified bronchus or lung: Secondary | ICD-10-CM | POA: Diagnosis not present

## 2018-01-26 DIAGNOSIS — J449 Chronic obstructive pulmonary disease, unspecified: Secondary | ICD-10-CM | POA: Diagnosis not present

## 2018-01-26 DIAGNOSIS — F0151 Vascular dementia with behavioral disturbance: Secondary | ICD-10-CM | POA: Diagnosis not present

## 2018-01-26 DIAGNOSIS — I495 Sick sinus syndrome: Secondary | ICD-10-CM | POA: Diagnosis not present

## 2018-01-26 DIAGNOSIS — I5030 Unspecified diastolic (congestive) heart failure: Secondary | ICD-10-CM | POA: Diagnosis not present

## 2018-01-27 DIAGNOSIS — Z79899 Other long term (current) drug therapy: Secondary | ICD-10-CM | POA: Diagnosis not present

## 2018-01-28 ENCOUNTER — Other Ambulatory Visit: Payer: Self-pay | Admitting: Oncology

## 2018-01-28 DIAGNOSIS — I5030 Unspecified diastolic (congestive) heart failure: Secondary | ICD-10-CM | POA: Diagnosis not present

## 2018-01-28 DIAGNOSIS — I4891 Unspecified atrial fibrillation: Secondary | ICD-10-CM | POA: Diagnosis not present

## 2018-01-28 DIAGNOSIS — I495 Sick sinus syndrome: Secondary | ICD-10-CM | POA: Diagnosis not present

## 2018-01-28 DIAGNOSIS — C3490 Malignant neoplasm of unspecified part of unspecified bronchus or lung: Secondary | ICD-10-CM | POA: Diagnosis not present

## 2018-01-28 DIAGNOSIS — F0151 Vascular dementia with behavioral disturbance: Secondary | ICD-10-CM | POA: Diagnosis not present

## 2018-01-28 DIAGNOSIS — J449 Chronic obstructive pulmonary disease, unspecified: Secondary | ICD-10-CM | POA: Diagnosis not present

## 2018-01-29 ENCOUNTER — Other Ambulatory Visit: Payer: Self-pay | Admitting: Oncology

## 2018-01-31 DIAGNOSIS — I4891 Unspecified atrial fibrillation: Secondary | ICD-10-CM | POA: Diagnosis not present

## 2018-01-31 DIAGNOSIS — C3490 Malignant neoplasm of unspecified part of unspecified bronchus or lung: Secondary | ICD-10-CM | POA: Diagnosis not present

## 2018-01-31 DIAGNOSIS — J449 Chronic obstructive pulmonary disease, unspecified: Secondary | ICD-10-CM | POA: Diagnosis not present

## 2018-01-31 DIAGNOSIS — F0151 Vascular dementia with behavioral disturbance: Secondary | ICD-10-CM | POA: Diagnosis not present

## 2018-01-31 DIAGNOSIS — I495 Sick sinus syndrome: Secondary | ICD-10-CM | POA: Diagnosis not present

## 2018-01-31 DIAGNOSIS — I5030 Unspecified diastolic (congestive) heart failure: Secondary | ICD-10-CM | POA: Diagnosis not present

## 2018-02-02 DIAGNOSIS — I495 Sick sinus syndrome: Secondary | ICD-10-CM | POA: Diagnosis not present

## 2018-02-02 DIAGNOSIS — F0151 Vascular dementia with behavioral disturbance: Secondary | ICD-10-CM | POA: Diagnosis not present

## 2018-02-02 DIAGNOSIS — I5030 Unspecified diastolic (congestive) heart failure: Secondary | ICD-10-CM | POA: Diagnosis not present

## 2018-02-02 DIAGNOSIS — J449 Chronic obstructive pulmonary disease, unspecified: Secondary | ICD-10-CM | POA: Diagnosis not present

## 2018-02-02 DIAGNOSIS — C3490 Malignant neoplasm of unspecified part of unspecified bronchus or lung: Secondary | ICD-10-CM | POA: Diagnosis not present

## 2018-02-02 DIAGNOSIS — I4891 Unspecified atrial fibrillation: Secondary | ICD-10-CM | POA: Diagnosis not present

## 2018-02-04 DIAGNOSIS — I4891 Unspecified atrial fibrillation: Secondary | ICD-10-CM | POA: Diagnosis not present

## 2018-02-04 DIAGNOSIS — I5030 Unspecified diastolic (congestive) heart failure: Secondary | ICD-10-CM | POA: Diagnosis not present

## 2018-02-04 DIAGNOSIS — F0151 Vascular dementia with behavioral disturbance: Secondary | ICD-10-CM | POA: Diagnosis not present

## 2018-02-04 DIAGNOSIS — J449 Chronic obstructive pulmonary disease, unspecified: Secondary | ICD-10-CM | POA: Diagnosis not present

## 2018-02-04 DIAGNOSIS — C3490 Malignant neoplasm of unspecified part of unspecified bronchus or lung: Secondary | ICD-10-CM | POA: Diagnosis not present

## 2018-02-04 DIAGNOSIS — I495 Sick sinus syndrome: Secondary | ICD-10-CM | POA: Diagnosis not present

## 2018-02-06 DIAGNOSIS — I4891 Unspecified atrial fibrillation: Secondary | ICD-10-CM | POA: Diagnosis not present

## 2018-02-06 DIAGNOSIS — I679 Cerebrovascular disease, unspecified: Secondary | ICD-10-CM | POA: Diagnosis not present

## 2018-02-06 DIAGNOSIS — L8990 Pressure ulcer of unspecified site, unspecified stage: Secondary | ICD-10-CM | POA: Diagnosis not present

## 2018-02-06 DIAGNOSIS — Z853 Personal history of malignant neoplasm of breast: Secondary | ICD-10-CM | POA: Diagnosis not present

## 2018-02-06 DIAGNOSIS — J690 Pneumonitis due to inhalation of food and vomit: Secondary | ICD-10-CM | POA: Diagnosis not present

## 2018-02-06 DIAGNOSIS — I25810 Atherosclerosis of coronary artery bypass graft(s) without angina pectoris: Secondary | ICD-10-CM | POA: Diagnosis not present

## 2018-02-06 DIAGNOSIS — R0902 Hypoxemia: Secondary | ICD-10-CM | POA: Diagnosis not present

## 2018-02-06 DIAGNOSIS — F0151 Vascular dementia with behavioral disturbance: Secondary | ICD-10-CM | POA: Diagnosis not present

## 2018-02-06 DIAGNOSIS — J449 Chronic obstructive pulmonary disease, unspecified: Secondary | ICD-10-CM | POA: Diagnosis not present

## 2018-02-06 DIAGNOSIS — E1159 Type 2 diabetes mellitus with other circulatory complications: Secondary | ICD-10-CM | POA: Diagnosis not present

## 2018-02-06 DIAGNOSIS — N183 Chronic kidney disease, stage 3 (moderate): Secondary | ICD-10-CM | POA: Diagnosis not present

## 2018-02-06 DIAGNOSIS — R1310 Dysphagia, unspecified: Secondary | ICD-10-CM | POA: Diagnosis not present

## 2018-02-06 DIAGNOSIS — I5030 Unspecified diastolic (congestive) heart failure: Secondary | ICD-10-CM | POA: Diagnosis not present

## 2018-02-06 DIAGNOSIS — I70209 Unspecified atherosclerosis of native arteries of extremities, unspecified extremity: Secondary | ICD-10-CM | POA: Diagnosis not present

## 2018-02-06 DIAGNOSIS — I495 Sick sinus syndrome: Secondary | ICD-10-CM | POA: Diagnosis not present

## 2018-02-06 DIAGNOSIS — C3490 Malignant neoplasm of unspecified part of unspecified bronchus or lung: Secondary | ICD-10-CM | POA: Diagnosis not present

## 2018-02-07 DIAGNOSIS — C3490 Malignant neoplasm of unspecified part of unspecified bronchus or lung: Secondary | ICD-10-CM | POA: Diagnosis not present

## 2018-02-07 DIAGNOSIS — I4891 Unspecified atrial fibrillation: Secondary | ICD-10-CM | POA: Diagnosis not present

## 2018-02-07 DIAGNOSIS — J449 Chronic obstructive pulmonary disease, unspecified: Secondary | ICD-10-CM | POA: Diagnosis not present

## 2018-02-07 DIAGNOSIS — I5030 Unspecified diastolic (congestive) heart failure: Secondary | ICD-10-CM | POA: Diagnosis not present

## 2018-02-07 DIAGNOSIS — I495 Sick sinus syndrome: Secondary | ICD-10-CM | POA: Diagnosis not present

## 2018-02-07 DIAGNOSIS — I509 Heart failure, unspecified: Secondary | ICD-10-CM | POA: Diagnosis not present

## 2018-02-07 DIAGNOSIS — F0151 Vascular dementia with behavioral disturbance: Secondary | ICD-10-CM | POA: Diagnosis not present

## 2018-02-08 DIAGNOSIS — F0391 Unspecified dementia with behavioral disturbance: Secondary | ICD-10-CM | POA: Diagnosis not present

## 2018-02-08 LAB — CUP PACEART INCLINIC DEVICE CHECK
Battery Voltage: 2.99 V
Brady Statistic RV Percent Paced: 99.61 %
Date Time Interrogation Session: 20191118171345
Implantable Lead Location: 753859
Implantable Lead Location: 753860
Implantable Pulse Generator Implant Date: 20161207
Lead Channel Impedance Value: 562.5 Ohm
Lead Channel Pacing Threshold Amplitude: 0.625 V
Lead Channel Pacing Threshold Pulse Width: 0.5 ms
Lead Channel Sensing Intrinsic Amplitude: 12 mV
Lead Channel Setting Pacing Amplitude: 0.875
MDC IDC LEAD IMPLANT DT: 20051116
MDC IDC LEAD IMPLANT DT: 20051116
MDC IDC MSMT BATTERY REMAINING LONGEVITY: 133 mo
MDC IDC SET LEADCHNL RV PACING PULSEWIDTH: 0.5 ms
MDC IDC SET LEADCHNL RV SENSING SENSITIVITY: 5 mV
MDC IDC STAT BRADY RA PERCENT PACED: 0 %
Pulse Gen Model: 2240
Pulse Gen Serial Number: 7838791

## 2018-02-09 DIAGNOSIS — F0151 Vascular dementia with behavioral disturbance: Secondary | ICD-10-CM | POA: Diagnosis not present

## 2018-02-09 DIAGNOSIS — I5030 Unspecified diastolic (congestive) heart failure: Secondary | ICD-10-CM | POA: Diagnosis not present

## 2018-02-09 DIAGNOSIS — I495 Sick sinus syndrome: Secondary | ICD-10-CM | POA: Diagnosis not present

## 2018-02-09 DIAGNOSIS — I4891 Unspecified atrial fibrillation: Secondary | ICD-10-CM | POA: Diagnosis not present

## 2018-02-09 DIAGNOSIS — C3490 Malignant neoplasm of unspecified part of unspecified bronchus or lung: Secondary | ICD-10-CM | POA: Diagnosis not present

## 2018-02-09 DIAGNOSIS — J449 Chronic obstructive pulmonary disease, unspecified: Secondary | ICD-10-CM | POA: Diagnosis not present

## 2018-02-11 DIAGNOSIS — F0151 Vascular dementia with behavioral disturbance: Secondary | ICD-10-CM | POA: Diagnosis not present

## 2018-02-11 DIAGNOSIS — C3490 Malignant neoplasm of unspecified part of unspecified bronchus or lung: Secondary | ICD-10-CM | POA: Diagnosis not present

## 2018-02-11 DIAGNOSIS — J449 Chronic obstructive pulmonary disease, unspecified: Secondary | ICD-10-CM | POA: Diagnosis not present

## 2018-02-11 DIAGNOSIS — I495 Sick sinus syndrome: Secondary | ICD-10-CM | POA: Diagnosis not present

## 2018-02-11 DIAGNOSIS — I5030 Unspecified diastolic (congestive) heart failure: Secondary | ICD-10-CM | POA: Diagnosis not present

## 2018-02-11 DIAGNOSIS — I4891 Unspecified atrial fibrillation: Secondary | ICD-10-CM | POA: Diagnosis not present

## 2018-02-14 DIAGNOSIS — C3490 Malignant neoplasm of unspecified part of unspecified bronchus or lung: Secondary | ICD-10-CM | POA: Diagnosis not present

## 2018-02-14 DIAGNOSIS — I495 Sick sinus syndrome: Secondary | ICD-10-CM | POA: Diagnosis not present

## 2018-02-14 DIAGNOSIS — I4891 Unspecified atrial fibrillation: Secondary | ICD-10-CM | POA: Diagnosis not present

## 2018-02-14 DIAGNOSIS — J449 Chronic obstructive pulmonary disease, unspecified: Secondary | ICD-10-CM | POA: Diagnosis not present

## 2018-02-14 DIAGNOSIS — F0151 Vascular dementia with behavioral disturbance: Secondary | ICD-10-CM | POA: Diagnosis not present

## 2018-02-14 DIAGNOSIS — I5030 Unspecified diastolic (congestive) heart failure: Secondary | ICD-10-CM | POA: Diagnosis not present

## 2018-02-16 DIAGNOSIS — I5030 Unspecified diastolic (congestive) heart failure: Secondary | ICD-10-CM | POA: Diagnosis not present

## 2018-02-16 DIAGNOSIS — J449 Chronic obstructive pulmonary disease, unspecified: Secondary | ICD-10-CM | POA: Diagnosis not present

## 2018-02-16 DIAGNOSIS — I4891 Unspecified atrial fibrillation: Secondary | ICD-10-CM | POA: Diagnosis not present

## 2018-02-16 DIAGNOSIS — C3490 Malignant neoplasm of unspecified part of unspecified bronchus or lung: Secondary | ICD-10-CM | POA: Diagnosis not present

## 2018-02-16 DIAGNOSIS — I495 Sick sinus syndrome: Secondary | ICD-10-CM | POA: Diagnosis not present

## 2018-02-16 DIAGNOSIS — F0151 Vascular dementia with behavioral disturbance: Secondary | ICD-10-CM | POA: Diagnosis not present

## 2018-02-18 DIAGNOSIS — C3490 Malignant neoplasm of unspecified part of unspecified bronchus or lung: Secondary | ICD-10-CM | POA: Diagnosis not present

## 2018-02-18 DIAGNOSIS — I4891 Unspecified atrial fibrillation: Secondary | ICD-10-CM | POA: Diagnosis not present

## 2018-02-18 DIAGNOSIS — I495 Sick sinus syndrome: Secondary | ICD-10-CM | POA: Diagnosis not present

## 2018-02-18 DIAGNOSIS — I5030 Unspecified diastolic (congestive) heart failure: Secondary | ICD-10-CM | POA: Diagnosis not present

## 2018-02-18 DIAGNOSIS — F0151 Vascular dementia with behavioral disturbance: Secondary | ICD-10-CM | POA: Diagnosis not present

## 2018-02-18 DIAGNOSIS — J449 Chronic obstructive pulmonary disease, unspecified: Secondary | ICD-10-CM | POA: Diagnosis not present

## 2018-02-21 DIAGNOSIS — I5030 Unspecified diastolic (congestive) heart failure: Secondary | ICD-10-CM | POA: Diagnosis not present

## 2018-02-21 DIAGNOSIS — J449 Chronic obstructive pulmonary disease, unspecified: Secondary | ICD-10-CM | POA: Diagnosis not present

## 2018-02-21 DIAGNOSIS — F0151 Vascular dementia with behavioral disturbance: Secondary | ICD-10-CM | POA: Diagnosis not present

## 2018-02-21 DIAGNOSIS — C3490 Malignant neoplasm of unspecified part of unspecified bronchus or lung: Secondary | ICD-10-CM | POA: Diagnosis not present

## 2018-02-21 DIAGNOSIS — I495 Sick sinus syndrome: Secondary | ICD-10-CM | POA: Diagnosis not present

## 2018-02-21 DIAGNOSIS — I4891 Unspecified atrial fibrillation: Secondary | ICD-10-CM | POA: Diagnosis not present

## 2018-02-23 DIAGNOSIS — I5030 Unspecified diastolic (congestive) heart failure: Secondary | ICD-10-CM | POA: Diagnosis not present

## 2018-02-23 DIAGNOSIS — C3490 Malignant neoplasm of unspecified part of unspecified bronchus or lung: Secondary | ICD-10-CM | POA: Diagnosis not present

## 2018-02-23 DIAGNOSIS — F0151 Vascular dementia with behavioral disturbance: Secondary | ICD-10-CM | POA: Diagnosis not present

## 2018-02-23 DIAGNOSIS — I4891 Unspecified atrial fibrillation: Secondary | ICD-10-CM | POA: Diagnosis not present

## 2018-02-23 DIAGNOSIS — I495 Sick sinus syndrome: Secondary | ICD-10-CM | POA: Diagnosis not present

## 2018-02-23 DIAGNOSIS — J449 Chronic obstructive pulmonary disease, unspecified: Secondary | ICD-10-CM | POA: Diagnosis not present

## 2018-02-24 DIAGNOSIS — F0151 Vascular dementia with behavioral disturbance: Secondary | ICD-10-CM | POA: Diagnosis not present

## 2018-02-24 DIAGNOSIS — I5030 Unspecified diastolic (congestive) heart failure: Secondary | ICD-10-CM | POA: Diagnosis not present

## 2018-02-24 DIAGNOSIS — J449 Chronic obstructive pulmonary disease, unspecified: Secondary | ICD-10-CM | POA: Diagnosis not present

## 2018-02-24 DIAGNOSIS — I4891 Unspecified atrial fibrillation: Secondary | ICD-10-CM | POA: Diagnosis not present

## 2018-02-24 DIAGNOSIS — C3490 Malignant neoplasm of unspecified part of unspecified bronchus or lung: Secondary | ICD-10-CM | POA: Diagnosis not present

## 2018-02-24 DIAGNOSIS — I495 Sick sinus syndrome: Secondary | ICD-10-CM | POA: Diagnosis not present

## 2018-02-28 DIAGNOSIS — I5030 Unspecified diastolic (congestive) heart failure: Secondary | ICD-10-CM | POA: Diagnosis not present

## 2018-02-28 DIAGNOSIS — R531 Weakness: Secondary | ICD-10-CM | POA: Diagnosis not present

## 2018-02-28 DIAGNOSIS — E118 Type 2 diabetes mellitus with unspecified complications: Secondary | ICD-10-CM | POA: Diagnosis not present

## 2018-02-28 DIAGNOSIS — C3490 Malignant neoplasm of unspecified part of unspecified bronchus or lung: Secondary | ICD-10-CM | POA: Diagnosis not present

## 2018-02-28 DIAGNOSIS — I509 Heart failure, unspecified: Secondary | ICD-10-CM | POA: Diagnosis not present

## 2018-02-28 DIAGNOSIS — I872 Venous insufficiency (chronic) (peripheral): Secondary | ICD-10-CM | POA: Diagnosis not present

## 2018-02-28 DIAGNOSIS — I495 Sick sinus syndrome: Secondary | ICD-10-CM | POA: Diagnosis not present

## 2018-02-28 DIAGNOSIS — J449 Chronic obstructive pulmonary disease, unspecified: Secondary | ICD-10-CM | POA: Diagnosis not present

## 2018-02-28 DIAGNOSIS — I4891 Unspecified atrial fibrillation: Secondary | ICD-10-CM | POA: Diagnosis not present

## 2018-02-28 DIAGNOSIS — F0151 Vascular dementia with behavioral disturbance: Secondary | ICD-10-CM | POA: Diagnosis not present

## 2018-03-03 DIAGNOSIS — I495 Sick sinus syndrome: Secondary | ICD-10-CM | POA: Diagnosis not present

## 2018-03-03 DIAGNOSIS — I4891 Unspecified atrial fibrillation: Secondary | ICD-10-CM | POA: Diagnosis not present

## 2018-03-03 DIAGNOSIS — F0151 Vascular dementia with behavioral disturbance: Secondary | ICD-10-CM | POA: Diagnosis not present

## 2018-03-03 DIAGNOSIS — I5030 Unspecified diastolic (congestive) heart failure: Secondary | ICD-10-CM | POA: Diagnosis not present

## 2018-03-03 DIAGNOSIS — J449 Chronic obstructive pulmonary disease, unspecified: Secondary | ICD-10-CM | POA: Diagnosis not present

## 2018-03-03 DIAGNOSIS — C3490 Malignant neoplasm of unspecified part of unspecified bronchus or lung: Secondary | ICD-10-CM | POA: Diagnosis not present

## 2018-03-04 DIAGNOSIS — I4891 Unspecified atrial fibrillation: Secondary | ICD-10-CM | POA: Diagnosis not present

## 2018-03-04 DIAGNOSIS — J449 Chronic obstructive pulmonary disease, unspecified: Secondary | ICD-10-CM | POA: Diagnosis not present

## 2018-03-04 DIAGNOSIS — I5030 Unspecified diastolic (congestive) heart failure: Secondary | ICD-10-CM | POA: Diagnosis not present

## 2018-03-04 DIAGNOSIS — C3490 Malignant neoplasm of unspecified part of unspecified bronchus or lung: Secondary | ICD-10-CM | POA: Diagnosis not present

## 2018-03-04 DIAGNOSIS — I495 Sick sinus syndrome: Secondary | ICD-10-CM | POA: Diagnosis not present

## 2018-03-04 DIAGNOSIS — F0151 Vascular dementia with behavioral disturbance: Secondary | ICD-10-CM | POA: Diagnosis not present

## 2018-03-07 DIAGNOSIS — J449 Chronic obstructive pulmonary disease, unspecified: Secondary | ICD-10-CM | POA: Diagnosis not present

## 2018-03-07 DIAGNOSIS — I495 Sick sinus syndrome: Secondary | ICD-10-CM | POA: Diagnosis not present

## 2018-03-07 DIAGNOSIS — R451 Restlessness and agitation: Secondary | ICD-10-CM | POA: Diagnosis not present

## 2018-03-07 DIAGNOSIS — I5030 Unspecified diastolic (congestive) heart failure: Secondary | ICD-10-CM | POA: Diagnosis not present

## 2018-03-07 DIAGNOSIS — R0602 Shortness of breath: Secondary | ICD-10-CM | POA: Diagnosis not present

## 2018-03-07 DIAGNOSIS — F0151 Vascular dementia with behavioral disturbance: Secondary | ICD-10-CM | POA: Diagnosis not present

## 2018-03-07 DIAGNOSIS — I4891 Unspecified atrial fibrillation: Secondary | ICD-10-CM | POA: Diagnosis not present

## 2018-03-07 DIAGNOSIS — E118 Type 2 diabetes mellitus with unspecified complications: Secondary | ICD-10-CM | POA: Diagnosis not present

## 2018-03-07 DIAGNOSIS — C3490 Malignant neoplasm of unspecified part of unspecified bronchus or lung: Secondary | ICD-10-CM | POA: Diagnosis not present

## 2018-03-07 DIAGNOSIS — F4542 Pain disorder with related psychological factors: Secondary | ICD-10-CM | POA: Diagnosis not present

## 2018-03-08 DIAGNOSIS — I495 Sick sinus syndrome: Secondary | ICD-10-CM | POA: Diagnosis not present

## 2018-03-08 DIAGNOSIS — J449 Chronic obstructive pulmonary disease, unspecified: Secondary | ICD-10-CM | POA: Diagnosis not present

## 2018-03-08 DIAGNOSIS — I5030 Unspecified diastolic (congestive) heart failure: Secondary | ICD-10-CM | POA: Diagnosis not present

## 2018-03-08 DIAGNOSIS — I4891 Unspecified atrial fibrillation: Secondary | ICD-10-CM | POA: Diagnosis not present

## 2018-03-08 DIAGNOSIS — F0151 Vascular dementia with behavioral disturbance: Secondary | ICD-10-CM | POA: Diagnosis not present

## 2018-03-08 DIAGNOSIS — C3490 Malignant neoplasm of unspecified part of unspecified bronchus or lung: Secondary | ICD-10-CM | POA: Diagnosis not present

## 2018-04-07 ENCOUNTER — Other Ambulatory Visit: Payer: Self-pay | Admitting: Oncology

## 2018-04-09 DEATH — deceased

## 2018-06-16 ENCOUNTER — Encounter: Payer: Self-pay | Admitting: Nurse Practitioner

## 2018-08-02 ENCOUNTER — Encounter: Payer: BLUE CROSS/BLUE SHIELD | Admitting: Internal Medicine
